# Patient Record
Sex: Female | Born: 1960 | Race: White | Hispanic: No | Marital: Single | Smoking: Current every day smoker
Health system: Southern US, Community
[De-identification: ages and names within clinical notes are randomized; demographics above are authoritative.]

## PROBLEM LIST (undated history)

## (undated) DIAGNOSIS — E119 Type 2 diabetes mellitus without complications: Secondary | ICD-10-CM

## (undated) DIAGNOSIS — J449 Chronic obstructive pulmonary disease, unspecified: Secondary | ICD-10-CM

## (undated) DIAGNOSIS — G709 Myoneural disorder, unspecified: Secondary | ICD-10-CM

## (undated) DIAGNOSIS — F329 Major depressive disorder, single episode, unspecified: Secondary | ICD-10-CM

## (undated) DIAGNOSIS — L0390 Cellulitis, unspecified: Secondary | ICD-10-CM

## (undated) DIAGNOSIS — K219 Gastro-esophageal reflux disease without esophagitis: Secondary | ICD-10-CM

## (undated) DIAGNOSIS — E039 Hypothyroidism, unspecified: Secondary | ICD-10-CM

## (undated) DIAGNOSIS — B958 Unspecified staphylococcus as the cause of diseases classified elsewhere: Secondary | ICD-10-CM

## (undated) DIAGNOSIS — Z22322 Carrier or suspected carrier of Methicillin resistant Staphylococcus aureus: Secondary | ICD-10-CM

## (undated) DIAGNOSIS — E1140 Type 2 diabetes mellitus with diabetic neuropathy, unspecified: Secondary | ICD-10-CM

## (undated) DIAGNOSIS — Z9989 Dependence on other enabling machines and devices: Secondary | ICD-10-CM

## (undated) DIAGNOSIS — I5032 Chronic diastolic (congestive) heart failure: Secondary | ICD-10-CM

## (undated) DIAGNOSIS — I739 Peripheral vascular disease, unspecified: Secondary | ICD-10-CM

## (undated) DIAGNOSIS — I10 Essential (primary) hypertension: Secondary | ICD-10-CM

## (undated) DIAGNOSIS — G459 Transient cerebral ischemic attack, unspecified: Secondary | ICD-10-CM

## (undated) DIAGNOSIS — T4145XA Adverse effect of unspecified anesthetic, initial encounter: Secondary | ICD-10-CM

## (undated) DIAGNOSIS — F32A Depression, unspecified: Secondary | ICD-10-CM

## (undated) DIAGNOSIS — F419 Anxiety disorder, unspecified: Secondary | ICD-10-CM

## (undated) DIAGNOSIS — R001 Bradycardia, unspecified: Secondary | ICD-10-CM

## (undated) DIAGNOSIS — G4733 Obstructive sleep apnea (adult) (pediatric): Secondary | ICD-10-CM

## (undated) DIAGNOSIS — E785 Hyperlipidemia, unspecified: Secondary | ICD-10-CM

## (undated) HISTORY — PX: DILATION AND CURETTAGE OF UTERUS: SHX78

## (undated) HISTORY — PX: BLADDER SURGERY: SHX569

## (undated) HISTORY — PX: WRIST SURGERY: SHX841

## (undated) HISTORY — PX: TOE AMPUTATION: SHX809

## (undated) HISTORY — PX: GANGLION CYST EXCISION: SHX1691

## (undated) HISTORY — DX: Transient cerebral ischemic attack, unspecified: G45.9

## (undated) HISTORY — DX: Myoneural disorder, unspecified: G70.9

## (undated) HISTORY — PX: TUBAL LIGATION: SHX77

---

## 1998-01-05 ENCOUNTER — Other Ambulatory Visit: Admission: RE | Admit: 1998-01-05 | Discharge: 1998-01-05 | Payer: Self-pay | Admitting: Family Medicine

## 1998-05-14 ENCOUNTER — Inpatient Hospital Stay (HOSPITAL_COMMUNITY): Admission: AD | Admit: 1998-05-14 | Discharge: 1998-05-14 | Payer: Self-pay | Admitting: Obstetrics

## 1998-07-05 ENCOUNTER — Ambulatory Visit (HOSPITAL_COMMUNITY): Admission: RE | Admit: 1998-07-05 | Discharge: 1998-07-05 | Payer: Self-pay | Admitting: Obstetrics & Gynecology

## 1998-07-08 ENCOUNTER — Inpatient Hospital Stay (HOSPITAL_COMMUNITY): Admission: AD | Admit: 1998-07-08 | Discharge: 1998-07-08 | Payer: Self-pay | Admitting: Obstetrics & Gynecology

## 1998-07-23 ENCOUNTER — Encounter: Admission: RE | Admit: 1998-07-23 | Discharge: 1998-07-23 | Payer: Self-pay | Admitting: Obstetrics & Gynecology

## 1998-10-11 ENCOUNTER — Encounter: Admission: RE | Admit: 1998-10-11 | Discharge: 1998-10-11 | Payer: Self-pay | Admitting: Internal Medicine

## 1998-10-15 ENCOUNTER — Emergency Department (HOSPITAL_COMMUNITY): Admission: EM | Admit: 1998-10-15 | Discharge: 1998-10-15 | Payer: Self-pay | Admitting: Emergency Medicine

## 1998-10-16 ENCOUNTER — Encounter: Payer: Self-pay | Admitting: Emergency Medicine

## 1998-10-28 ENCOUNTER — Encounter: Payer: Self-pay | Admitting: Internal Medicine

## 1998-10-28 ENCOUNTER — Encounter: Admission: RE | Admit: 1998-10-28 | Discharge: 1998-10-28 | Payer: Self-pay | Admitting: Internal Medicine

## 1998-10-28 ENCOUNTER — Ambulatory Visit (HOSPITAL_COMMUNITY): Admission: RE | Admit: 1998-10-28 | Discharge: 1998-10-28 | Payer: Self-pay | Admitting: Internal Medicine

## 1998-12-21 ENCOUNTER — Encounter: Admission: RE | Admit: 1998-12-21 | Discharge: 1998-12-21 | Payer: Self-pay | Admitting: Internal Medicine

## 1999-05-14 ENCOUNTER — Emergency Department (HOSPITAL_COMMUNITY): Admission: EM | Admit: 1999-05-14 | Discharge: 1999-05-14 | Payer: Self-pay | Admitting: Emergency Medicine

## 1999-05-14 ENCOUNTER — Encounter: Payer: Self-pay | Admitting: Emergency Medicine

## 1999-05-29 ENCOUNTER — Emergency Department (HOSPITAL_COMMUNITY): Admission: EM | Admit: 1999-05-29 | Discharge: 1999-05-29 | Payer: Self-pay | Admitting: Emergency Medicine

## 1999-05-29 ENCOUNTER — Encounter: Payer: Self-pay | Admitting: *Deleted

## 1999-06-07 ENCOUNTER — Encounter: Admission: RE | Admit: 1999-06-07 | Discharge: 1999-06-07 | Payer: Self-pay | Admitting: Internal Medicine

## 1999-07-12 ENCOUNTER — Encounter: Admission: RE | Admit: 1999-07-12 | Discharge: 1999-07-12 | Payer: Self-pay | Admitting: Internal Medicine

## 2000-01-05 ENCOUNTER — Encounter: Admission: RE | Admit: 2000-01-05 | Discharge: 2000-01-05 | Payer: Self-pay | Admitting: Internal Medicine

## 2000-05-10 ENCOUNTER — Encounter: Admission: RE | Admit: 2000-05-10 | Discharge: 2000-05-10 | Payer: Self-pay | Admitting: Internal Medicine

## 2000-07-18 ENCOUNTER — Emergency Department (HOSPITAL_COMMUNITY): Admission: EM | Admit: 2000-07-18 | Discharge: 2000-07-18 | Payer: Self-pay | Admitting: Emergency Medicine

## 2000-07-21 ENCOUNTER — Emergency Department (HOSPITAL_COMMUNITY): Admission: EM | Admit: 2000-07-21 | Discharge: 2000-07-21 | Payer: Self-pay | Admitting: Emergency Medicine

## 2000-09-14 ENCOUNTER — Encounter: Payer: Self-pay | Admitting: Emergency Medicine

## 2000-09-14 ENCOUNTER — Emergency Department (HOSPITAL_COMMUNITY): Admission: EM | Admit: 2000-09-14 | Discharge: 2000-09-14 | Payer: Self-pay | Admitting: Emergency Medicine

## 2001-01-01 ENCOUNTER — Emergency Department (HOSPITAL_COMMUNITY): Admission: EM | Admit: 2001-01-01 | Discharge: 2001-01-01 | Payer: Self-pay | Admitting: Emergency Medicine

## 2001-01-02 ENCOUNTER — Encounter: Admission: RE | Admit: 2001-01-02 | Discharge: 2001-01-02 | Payer: Self-pay | Admitting: Internal Medicine

## 2001-02-05 ENCOUNTER — Encounter: Admission: RE | Admit: 2001-02-05 | Discharge: 2001-02-05 | Payer: Self-pay | Admitting: Internal Medicine

## 2001-03-14 ENCOUNTER — Encounter: Admission: RE | Admit: 2001-03-14 | Discharge: 2001-03-14 | Payer: Self-pay | Admitting: Internal Medicine

## 2001-03-17 ENCOUNTER — Emergency Department (HOSPITAL_COMMUNITY): Admission: EM | Admit: 2001-03-17 | Discharge: 2001-03-17 | Payer: Self-pay | Admitting: Emergency Medicine

## 2001-03-21 ENCOUNTER — Emergency Department (HOSPITAL_COMMUNITY): Admission: EM | Admit: 2001-03-21 | Discharge: 2001-03-21 | Payer: Self-pay | Admitting: Emergency Medicine

## 2001-04-03 ENCOUNTER — Encounter: Admission: RE | Admit: 2001-04-03 | Discharge: 2001-04-03 | Payer: Self-pay | Admitting: Internal Medicine

## 2001-09-07 ENCOUNTER — Encounter: Payer: Self-pay | Admitting: Emergency Medicine

## 2001-09-07 ENCOUNTER — Emergency Department (HOSPITAL_COMMUNITY): Admission: EM | Admit: 2001-09-07 | Discharge: 2001-09-07 | Payer: Self-pay | Admitting: Emergency Medicine

## 2001-09-11 ENCOUNTER — Encounter: Admission: RE | Admit: 2001-09-11 | Discharge: 2001-09-11 | Payer: Self-pay

## 2001-10-14 ENCOUNTER — Ambulatory Visit (HOSPITAL_COMMUNITY): Admission: RE | Admit: 2001-10-14 | Discharge: 2001-10-14 | Payer: Self-pay | Admitting: Internal Medicine

## 2001-10-14 ENCOUNTER — Encounter: Payer: Self-pay | Admitting: Internal Medicine

## 2001-10-14 ENCOUNTER — Encounter: Admission: RE | Admit: 2001-10-14 | Discharge: 2001-10-14 | Payer: Self-pay | Admitting: Internal Medicine

## 2001-11-27 ENCOUNTER — Encounter: Admission: RE | Admit: 2001-11-27 | Discharge: 2001-11-27 | Payer: Self-pay | Admitting: Internal Medicine

## 2002-10-08 ENCOUNTER — Encounter: Admission: RE | Admit: 2002-10-08 | Discharge: 2002-10-08 | Payer: Self-pay | Admitting: Internal Medicine

## 2002-11-26 ENCOUNTER — Encounter: Admission: RE | Admit: 2002-11-26 | Discharge: 2002-11-26 | Payer: Self-pay | Admitting: Internal Medicine

## 2003-07-07 ENCOUNTER — Emergency Department (HOSPITAL_COMMUNITY): Admission: AD | Admit: 2003-07-07 | Discharge: 2003-07-07 | Payer: Self-pay | Admitting: Family Medicine

## 2003-07-09 ENCOUNTER — Encounter: Admission: RE | Admit: 2003-07-09 | Discharge: 2003-07-09 | Payer: Self-pay | Admitting: Internal Medicine

## 2003-08-02 ENCOUNTER — Emergency Department (HOSPITAL_COMMUNITY): Admission: EM | Admit: 2003-08-02 | Discharge: 2003-08-02 | Payer: Self-pay | Admitting: *Deleted

## 2003-08-06 ENCOUNTER — Encounter: Admission: RE | Admit: 2003-08-06 | Discharge: 2003-08-06 | Payer: Self-pay | Admitting: Internal Medicine

## 2003-08-12 ENCOUNTER — Emergency Department (HOSPITAL_COMMUNITY): Admission: AD | Admit: 2003-08-12 | Discharge: 2003-08-12 | Payer: Self-pay | Admitting: Family Medicine

## 2003-08-13 ENCOUNTER — Encounter: Admission: RE | Admit: 2003-08-13 | Discharge: 2003-11-11 | Payer: Self-pay | Admitting: Specialist

## 2003-09-24 ENCOUNTER — Encounter: Admission: RE | Admit: 2003-09-24 | Discharge: 2003-09-24 | Payer: Self-pay | Admitting: Internal Medicine

## 2003-10-06 ENCOUNTER — Encounter: Admission: RE | Admit: 2003-10-06 | Discharge: 2004-01-04 | Payer: Self-pay | Admitting: Internal Medicine

## 2003-11-01 ENCOUNTER — Encounter: Admission: RE | Admit: 2003-11-01 | Discharge: 2003-11-01 | Payer: Self-pay | Admitting: Specialist

## 2003-12-02 ENCOUNTER — Emergency Department (HOSPITAL_COMMUNITY): Admission: EM | Admit: 2003-12-02 | Discharge: 2003-12-02 | Payer: Self-pay | Admitting: Family Medicine

## 2003-12-04 ENCOUNTER — Emergency Department (HOSPITAL_COMMUNITY): Admission: EM | Admit: 2003-12-04 | Discharge: 2003-12-04 | Payer: Self-pay | Admitting: Family Medicine

## 2004-05-10 ENCOUNTER — Emergency Department (HOSPITAL_COMMUNITY): Admission: EM | Admit: 2004-05-10 | Discharge: 2004-05-10 | Payer: Self-pay | Admitting: Family Medicine

## 2004-05-12 ENCOUNTER — Emergency Department (HOSPITAL_COMMUNITY): Admission: EM | Admit: 2004-05-12 | Discharge: 2004-05-12 | Payer: Self-pay | Admitting: Family Medicine

## 2004-08-28 ENCOUNTER — Emergency Department (HOSPITAL_COMMUNITY): Admission: EM | Admit: 2004-08-28 | Discharge: 2004-08-28 | Payer: Self-pay | Admitting: Family Medicine

## 2004-08-29 ENCOUNTER — Emergency Department (HOSPITAL_COMMUNITY): Admission: EM | Admit: 2004-08-29 | Discharge: 2004-08-29 | Payer: Self-pay | Admitting: Family Medicine

## 2004-09-29 ENCOUNTER — Emergency Department (HOSPITAL_COMMUNITY): Admission: EM | Admit: 2004-09-29 | Discharge: 2004-09-29 | Payer: Self-pay | Admitting: Emergency Medicine

## 2005-01-29 ENCOUNTER — Emergency Department (HOSPITAL_COMMUNITY): Admission: EM | Admit: 2005-01-29 | Discharge: 2005-01-29 | Payer: Self-pay | Admitting: Emergency Medicine

## 2005-05-16 ENCOUNTER — Emergency Department (HOSPITAL_COMMUNITY): Admission: EM | Admit: 2005-05-16 | Discharge: 2005-05-16 | Payer: Self-pay | Admitting: Family Medicine

## 2005-08-21 DIAGNOSIS — B958 Unspecified staphylococcus as the cause of diseases classified elsewhere: Secondary | ICD-10-CM

## 2005-08-21 HISTORY — DX: Unspecified staphylococcus as the cause of diseases classified elsewhere: B95.8

## 2005-11-10 ENCOUNTER — Emergency Department (HOSPITAL_COMMUNITY): Admission: EM | Admit: 2005-11-10 | Discharge: 2005-11-10 | Payer: Self-pay | Admitting: Family Medicine

## 2006-01-16 ENCOUNTER — Emergency Department (HOSPITAL_COMMUNITY): Admission: EM | Admit: 2006-01-16 | Discharge: 2006-01-16 | Payer: Self-pay | Admitting: Family Medicine

## 2006-03-26 ENCOUNTER — Emergency Department (HOSPITAL_COMMUNITY): Admission: EM | Admit: 2006-03-26 | Discharge: 2006-03-26 | Payer: Self-pay | Admitting: Emergency Medicine

## 2006-06-24 ENCOUNTER — Emergency Department (HOSPITAL_COMMUNITY): Admission: EM | Admit: 2006-06-24 | Discharge: 2006-06-24 | Payer: Self-pay | Admitting: Emergency Medicine

## 2006-06-27 ENCOUNTER — Emergency Department (HOSPITAL_COMMUNITY): Admission: EM | Admit: 2006-06-27 | Discharge: 2006-06-27 | Payer: Self-pay | Admitting: Emergency Medicine

## 2007-01-31 ENCOUNTER — Emergency Department (HOSPITAL_COMMUNITY): Admission: EM | Admit: 2007-01-31 | Discharge: 2007-01-31 | Payer: Self-pay | Admitting: Emergency Medicine

## 2007-02-03 ENCOUNTER — Emergency Department (HOSPITAL_COMMUNITY): Admission: EM | Admit: 2007-02-03 | Discharge: 2007-02-03 | Payer: Self-pay | Admitting: Emergency Medicine

## 2007-07-25 ENCOUNTER — Telehealth (INDEPENDENT_AMBULATORY_CARE_PROVIDER_SITE_OTHER): Payer: Self-pay | Admitting: *Deleted

## 2007-07-25 ENCOUNTER — Emergency Department (HOSPITAL_COMMUNITY): Admission: EM | Admit: 2007-07-25 | Discharge: 2007-07-25 | Payer: Self-pay | Admitting: Emergency Medicine

## 2007-08-05 ENCOUNTER — Encounter (INDEPENDENT_AMBULATORY_CARE_PROVIDER_SITE_OTHER): Payer: Self-pay | Admitting: Nurse Practitioner

## 2007-08-06 ENCOUNTER — Telehealth (INDEPENDENT_AMBULATORY_CARE_PROVIDER_SITE_OTHER): Payer: Self-pay | Admitting: Nurse Practitioner

## 2007-08-06 ENCOUNTER — Ambulatory Visit: Payer: Self-pay | Admitting: Nurse Practitioner

## 2007-08-06 DIAGNOSIS — E1165 Type 2 diabetes mellitus with hyperglycemia: Secondary | ICD-10-CM | POA: Insufficient documentation

## 2007-08-06 DIAGNOSIS — E118 Type 2 diabetes mellitus with unspecified complications: Secondary | ICD-10-CM

## 2007-08-06 LAB — CONVERTED CEMR LAB
Blood Glucose, Fingerstick: 295
Hgb A1c MFr Bld: 12 %

## 2007-08-07 LAB — CONVERTED CEMR LAB
ALT: 23 units/L (ref 0–35)
AST: 18 units/L (ref 0–37)
Albumin: 4 g/dL (ref 3.5–5.2)
Alkaline Phosphatase: 92 units/L (ref 39–117)
BUN: 10 mg/dL (ref 6–23)
Basophils Absolute: 0 10*3/uL (ref 0.0–0.1)
Basophils Relative: 0 % (ref 0–1)
CO2: 25 meq/L (ref 19–32)
Calcium: 9.1 mg/dL (ref 8.4–10.5)
Chloride: 102 meq/L (ref 96–112)
Cholesterol: 221 mg/dL — ABNORMAL HIGH (ref 0–200)
Creatinine, Ser: 0.62 mg/dL (ref 0.40–1.20)
Eosinophils Absolute: 0 10*3/uL — ABNORMAL LOW (ref 0.2–0.7)
Eosinophils Relative: 0 % (ref 0–5)
Glucose, Bld: 221 mg/dL — ABNORMAL HIGH (ref 70–99)
HCT: 44.5 % (ref 36.0–46.0)
HDL: 34 mg/dL — ABNORMAL LOW (ref >39)
Hemoglobin: 14.9 g/dL (ref 12.0–15.0)
Lymphocytes Relative: 45 % (ref 12–46)
Lymphs Abs: 2.4 10*3/uL (ref 0.7–4.0)
MCHC: 33.5 g/dL (ref 30.0–36.0)
MCV: 84.6 fL (ref 78.0–100.0)
Monocytes Absolute: 0.6 10*3/uL (ref 0.1–1.0)
Monocytes Relative: 12 % (ref 3–12)
Neutro Abs: 2.3 10*3/uL (ref 1.7–7.7)
Neutrophils Relative %: 43 % (ref 43–77)
Platelets: 205 10*3/uL (ref 150–400)
Potassium: 4.6 meq/L (ref 3.5–5.3)
RBC: 5.26 M/uL — ABNORMAL HIGH (ref 3.87–5.11)
RDW: 13.3 % (ref 11.5–15.5)
Sodium: 139 meq/L (ref 135–145)
TSH: 4.586 microintl units/mL (ref 0.350–5.50)
Total Bilirubin: 0.5 mg/dL (ref 0.3–1.2)
Total CHOL/HDL Ratio: 6.5
Total Protein: 6.8 g/dL (ref 6.0–8.3)
Triglycerides: 488 mg/dL — ABNORMAL HIGH (ref <150)
WBC: 5.4 10*3/uL (ref 4.0–10.5)

## 2007-09-07 ENCOUNTER — Emergency Department (HOSPITAL_COMMUNITY): Admission: EM | Admit: 2007-09-07 | Discharge: 2007-09-08 | Payer: Self-pay | Admitting: *Deleted

## 2007-10-27 ENCOUNTER — Emergency Department (HOSPITAL_COMMUNITY): Admission: EM | Admit: 2007-10-27 | Discharge: 2007-10-27 | Payer: Self-pay | Admitting: Family Medicine

## 2008-01-27 ENCOUNTER — Telehealth (INDEPENDENT_AMBULATORY_CARE_PROVIDER_SITE_OTHER): Payer: Self-pay | Admitting: Nurse Practitioner

## 2008-01-27 ENCOUNTER — Emergency Department (HOSPITAL_COMMUNITY): Admission: EM | Admit: 2008-01-27 | Discharge: 2008-01-27 | Payer: Self-pay | Admitting: Emergency Medicine

## 2008-01-27 LAB — CONVERTED CEMR LAB
BUN: 11 mg/dL
Chloride: 100 meq/L
Creatinine, Ser: 0.7 mg/dL
Glucose, Bld: 358 mg/dL
HCT: 44 %
Hemoglobin: 15 g/dL
Potassium: 4.7 meq/L
Sodium: 134 meq/L

## 2008-01-30 ENCOUNTER — Ambulatory Visit: Payer: Self-pay | Admitting: Nurse Practitioner

## 2008-01-30 LAB — CONVERTED CEMR LAB
Bilirubin Urine: NEGATIVE
Blood Glucose, Fingerstick: 332
Blood in Urine, dipstick: NEGATIVE
Glucose, Urine, Semiquant: >=1000
Hgb A1c MFr Bld: 13 %
Nitrite: NEGATIVE
Protein, U semiquant: NEGATIVE
Specific Gravity, Urine: >=1.03
Urobilinogen, UA: 0.2
WBC Urine, dipstick: NEGATIVE
pH: 5

## 2008-02-24 ENCOUNTER — Ambulatory Visit: Payer: Self-pay | Admitting: Nurse Practitioner

## 2008-02-25 ENCOUNTER — Encounter (INDEPENDENT_AMBULATORY_CARE_PROVIDER_SITE_OTHER): Payer: Self-pay | Admitting: Nurse Practitioner

## 2008-02-25 LAB — CONVERTED CEMR LAB
Cholesterol: 254 mg/dL — ABNORMAL HIGH (ref 0–200)
HDL: 35 mg/dL — ABNORMAL LOW (ref >39)
Total CHOL/HDL Ratio: 7.3
Triglycerides: 654 mg/dL — ABNORMAL HIGH (ref <150)

## 2008-06-05 ENCOUNTER — Emergency Department (HOSPITAL_COMMUNITY): Admission: EM | Admit: 2008-06-05 | Discharge: 2008-06-05 | Payer: Self-pay | Admitting: Emergency Medicine

## 2008-06-25 ENCOUNTER — Telehealth (INDEPENDENT_AMBULATORY_CARE_PROVIDER_SITE_OTHER): Payer: Self-pay | Admitting: Nurse Practitioner

## 2008-07-10 ENCOUNTER — Encounter (INDEPENDENT_AMBULATORY_CARE_PROVIDER_SITE_OTHER): Payer: Self-pay | Admitting: *Deleted

## 2008-09-18 ENCOUNTER — Emergency Department (HOSPITAL_COMMUNITY): Admission: EM | Admit: 2008-09-18 | Discharge: 2008-09-18 | Payer: Self-pay | Admitting: Emergency Medicine

## 2009-03-14 ENCOUNTER — Emergency Department (HOSPITAL_COMMUNITY): Admission: EM | Admit: 2009-03-14 | Discharge: 2009-03-14 | Payer: Self-pay | Admitting: Emergency Medicine

## 2009-06-04 ENCOUNTER — Ambulatory Visit: Payer: Self-pay | Admitting: Nurse Practitioner

## 2009-06-04 DIAGNOSIS — F17200 Nicotine dependence, unspecified, uncomplicated: Secondary | ICD-10-CM

## 2009-06-04 DIAGNOSIS — L84 Corns and callosities: Secondary | ICD-10-CM | POA: Insufficient documentation

## 2009-06-04 DIAGNOSIS — B373 Candidiasis of vulva and vagina: Secondary | ICD-10-CM

## 2009-06-04 LAB — CONVERTED CEMR LAB
Blood Glucose, Fingerstick: 333
Cholesterol, target level: 200 mg/dL
HDL goal, serum: 40 mg/dL
Hgb A1c MFr Bld: 12.1 %
LDL Goal: 100 mg/dL

## 2009-06-11 ENCOUNTER — Telehealth (INDEPENDENT_AMBULATORY_CARE_PROVIDER_SITE_OTHER): Payer: Self-pay | Admitting: Nurse Practitioner

## 2009-06-22 ENCOUNTER — Encounter (INDEPENDENT_AMBULATORY_CARE_PROVIDER_SITE_OTHER): Payer: Self-pay | Admitting: Nurse Practitioner

## 2009-06-22 ENCOUNTER — Ambulatory Visit: Payer: Self-pay | Admitting: Nurse Practitioner

## 2009-06-22 ENCOUNTER — Telehealth (INDEPENDENT_AMBULATORY_CARE_PROVIDER_SITE_OTHER): Payer: Self-pay | Admitting: Nurse Practitioner

## 2009-06-22 DIAGNOSIS — R3915 Urgency of urination: Secondary | ICD-10-CM

## 2009-06-22 LAB — CONVERTED CEMR LAB
Bilirubin Urine: NEGATIVE
Blood Glucose, Fingerstick: 277
Blood in Urine, dipstick: NEGATIVE
Glucose, Urine, Semiquant: 500
Ketones, urine, test strip: NEGATIVE
Nitrite: NEGATIVE
Protein, U semiquant: NEGATIVE
Specific Gravity, Urine: >=1.03
Urobilinogen, UA: 0.2
WBC Urine, dipstick: NEGATIVE
pH: 5

## 2009-06-24 ENCOUNTER — Encounter (INDEPENDENT_AMBULATORY_CARE_PROVIDER_SITE_OTHER): Payer: Self-pay | Admitting: Nurse Practitioner

## 2009-07-01 ENCOUNTER — Ambulatory Visit: Payer: Self-pay | Admitting: Nurse Practitioner

## 2009-07-01 ENCOUNTER — Encounter (INDEPENDENT_AMBULATORY_CARE_PROVIDER_SITE_OTHER): Payer: Self-pay | Admitting: Nurse Practitioner

## 2009-07-01 ENCOUNTER — Other Ambulatory Visit: Admission: RE | Admit: 2009-07-01 | Discharge: 2009-07-01 | Payer: Self-pay | Admitting: Internal Medicine

## 2009-07-01 DIAGNOSIS — R799 Abnormal finding of blood chemistry, unspecified: Secondary | ICD-10-CM | POA: Insufficient documentation

## 2009-07-01 LAB — CONVERTED CEMR LAB

## 2009-07-06 ENCOUNTER — Encounter (INDEPENDENT_AMBULATORY_CARE_PROVIDER_SITE_OTHER): Payer: Self-pay | Admitting: *Deleted

## 2009-07-06 ENCOUNTER — Encounter (INDEPENDENT_AMBULATORY_CARE_PROVIDER_SITE_OTHER): Payer: Self-pay | Admitting: Nurse Practitioner

## 2009-07-07 ENCOUNTER — Encounter (INDEPENDENT_AMBULATORY_CARE_PROVIDER_SITE_OTHER): Payer: Self-pay | Admitting: Nurse Practitioner

## 2009-07-09 ENCOUNTER — Ambulatory Visit (HOSPITAL_COMMUNITY): Admission: RE | Admit: 2009-07-09 | Discharge: 2009-07-09 | Payer: Self-pay | Admitting: Internal Medicine

## 2009-07-13 ENCOUNTER — Ambulatory Visit: Payer: Self-pay | Admitting: Nurse Practitioner

## 2009-08-04 ENCOUNTER — Encounter (INDEPENDENT_AMBULATORY_CARE_PROVIDER_SITE_OTHER): Payer: Self-pay | Admitting: Nurse Practitioner

## 2009-10-01 ENCOUNTER — Emergency Department (HOSPITAL_COMMUNITY): Admission: EM | Admit: 2009-10-01 | Discharge: 2009-10-01 | Payer: Self-pay | Admitting: Emergency Medicine

## 2009-10-05 ENCOUNTER — Encounter (INDEPENDENT_AMBULATORY_CARE_PROVIDER_SITE_OTHER): Payer: Self-pay | Admitting: Nurse Practitioner

## 2009-11-10 ENCOUNTER — Encounter (INDEPENDENT_AMBULATORY_CARE_PROVIDER_SITE_OTHER): Payer: Self-pay | Admitting: Nurse Practitioner

## 2009-12-10 ENCOUNTER — Emergency Department (HOSPITAL_COMMUNITY): Admission: EM | Admit: 2009-12-10 | Discharge: 2009-12-10 | Payer: Self-pay | Admitting: Emergency Medicine

## 2010-02-01 ENCOUNTER — Ambulatory Visit: Payer: Self-pay | Admitting: Nurse Practitioner

## 2010-02-01 DIAGNOSIS — F329 Major depressive disorder, single episode, unspecified: Secondary | ICD-10-CM

## 2010-02-01 DIAGNOSIS — L989 Disorder of the skin and subcutaneous tissue, unspecified: Secondary | ICD-10-CM | POA: Insufficient documentation

## 2010-02-01 LAB — CONVERTED CEMR LAB
Bilirubin Urine: NEGATIVE
Blood Glucose, Fingerstick: 274
Glucose, Urine, Semiquant: >=1000
KOH Prep: NEGATIVE
Ketones, urine, test strip: NEGATIVE
Nitrite: POSITIVE
Protein, U semiquant: NEGATIVE
Specific Gravity, Urine: >=1.03
Urobilinogen, UA: 0.2
WBC Urine, dipstick: NEGATIVE
pH: 5

## 2010-02-02 ENCOUNTER — Encounter (INDEPENDENT_AMBULATORY_CARE_PROVIDER_SITE_OTHER): Payer: Self-pay | Admitting: *Deleted

## 2010-02-02 LAB — CONVERTED CEMR LAB: Hgb A1c MFr Bld: 13.5 % — ABNORMAL HIGH (ref <5.7)

## 2010-02-18 DIAGNOSIS — L97509 Non-pressure chronic ulcer of other part of unspecified foot with unspecified severity: Secondary | ICD-10-CM

## 2010-03-01 ENCOUNTER — Telehealth (INDEPENDENT_AMBULATORY_CARE_PROVIDER_SITE_OTHER): Payer: Self-pay | Admitting: Nurse Practitioner

## 2010-03-17 ENCOUNTER — Emergency Department (HOSPITAL_COMMUNITY): Admission: EM | Admit: 2010-03-17 | Discharge: 2010-03-17 | Payer: Self-pay | Admitting: Family Medicine

## 2010-03-17 ENCOUNTER — Inpatient Hospital Stay (HOSPITAL_COMMUNITY): Admission: EM | Admit: 2010-03-17 | Discharge: 2010-03-22 | Payer: Self-pay | Admitting: Emergency Medicine

## 2010-03-17 LAB — CONVERTED CEMR LAB
HCT: 36 %
Hemoglobin: 12.6 g/dL
MCHC: 34.9 g/dL
MCV: 88.4 fL
Platelets: 202 10*3/uL
RBC: 4.07 M/uL
RDW: 13.3 %
WBC: 8.6 10*3/uL

## 2010-03-19 LAB — CONVERTED CEMR LAB: Hgb A1c MFr Bld: 11.8 %

## 2010-03-20 LAB — CONVERTED CEMR LAB
BUN: 8 mg/dL
CO2: 30 meq/L
Calcium: 8.8 mg/dL
Chloride: 102 meq/L
Creatinine, Ser: 0.51 mg/dL
Glucose, Bld: 235 mg/dL
Potassium: 4.8 meq/L
Sodium: 138 meq/L

## 2010-03-22 ENCOUNTER — Encounter (INDEPENDENT_AMBULATORY_CARE_PROVIDER_SITE_OTHER): Payer: Self-pay | Admitting: Internal Medicine

## 2010-03-23 DIAGNOSIS — M7730 Calcaneal spur, unspecified foot: Secondary | ICD-10-CM | POA: Insufficient documentation

## 2010-03-24 ENCOUNTER — Encounter (HOSPITAL_BASED_OUTPATIENT_CLINIC_OR_DEPARTMENT_OTHER): Admission: RE | Admit: 2010-03-24 | Discharge: 2010-06-22 | Payer: Self-pay | Admitting: Internal Medicine

## 2010-03-25 ENCOUNTER — Ambulatory Visit: Payer: Self-pay | Admitting: Nurse Practitioner

## 2010-03-25 LAB — CONVERTED CEMR LAB
Bilirubin Urine: NEGATIVE
Blood Glucose, Fingerstick: 136
Blood in Urine, dipstick: NEGATIVE
Glucose, Urine, Semiquant: NEGATIVE
Ketones, urine, test strip: NEGATIVE
Nitrite: NEGATIVE
Protein, U semiquant: NEGATIVE
Specific Gravity, Urine: 1.015
Urobilinogen, UA: 0.2
WBC Urine, dipstick: NEGATIVE
pH: 7.5

## 2010-03-28 ENCOUNTER — Ambulatory Visit (HOSPITAL_COMMUNITY): Admission: RE | Admit: 2010-03-28 | Discharge: 2010-03-28 | Payer: Self-pay | Admitting: General Surgery

## 2010-04-06 ENCOUNTER — Telehealth (INDEPENDENT_AMBULATORY_CARE_PROVIDER_SITE_OTHER): Payer: Self-pay | Admitting: Nurse Practitioner

## 2010-04-12 ENCOUNTER — Encounter (INDEPENDENT_AMBULATORY_CARE_PROVIDER_SITE_OTHER): Payer: Self-pay | Admitting: Nurse Practitioner

## 2010-04-12 DIAGNOSIS — S98119A Complete traumatic amputation of unspecified great toe, initial encounter: Secondary | ICD-10-CM

## 2010-04-12 DIAGNOSIS — M869 Osteomyelitis, unspecified: Secondary | ICD-10-CM | POA: Insufficient documentation

## 2010-04-13 ENCOUNTER — Ambulatory Visit: Payer: Self-pay | Admitting: Internal Medicine

## 2010-04-13 ENCOUNTER — Inpatient Hospital Stay (HOSPITAL_COMMUNITY): Admission: EM | Admit: 2010-04-13 | Discharge: 2010-04-17 | Payer: Self-pay | Admitting: *Deleted

## 2010-04-14 ENCOUNTER — Ambulatory Visit: Payer: Self-pay | Admitting: Vascular Surgery

## 2010-04-14 ENCOUNTER — Encounter (INDEPENDENT_AMBULATORY_CARE_PROVIDER_SITE_OTHER): Payer: Self-pay | Admitting: Internal Medicine

## 2010-04-14 ENCOUNTER — Encounter (INDEPENDENT_AMBULATORY_CARE_PROVIDER_SITE_OTHER): Payer: Self-pay | Admitting: Orthopedic Surgery

## 2010-04-21 ENCOUNTER — Telehealth (INDEPENDENT_AMBULATORY_CARE_PROVIDER_SITE_OTHER): Payer: Self-pay | Admitting: Nurse Practitioner

## 2010-04-22 ENCOUNTER — Telehealth (INDEPENDENT_AMBULATORY_CARE_PROVIDER_SITE_OTHER): Payer: Self-pay | Admitting: Nurse Practitioner

## 2010-04-22 ENCOUNTER — Ambulatory Visit: Payer: Self-pay | Admitting: Vascular Surgery

## 2010-05-02 ENCOUNTER — Ambulatory Visit: Payer: Self-pay | Admitting: Nurse Practitioner

## 2010-05-02 DIAGNOSIS — S4980XA Other specified injuries of shoulder and upper arm, unspecified arm, initial encounter: Secondary | ICD-10-CM

## 2010-05-19 ENCOUNTER — Emergency Department (HOSPITAL_COMMUNITY): Admission: EM | Admit: 2010-05-19 | Discharge: 2010-05-19 | Payer: Self-pay | Admitting: Emergency Medicine

## 2010-05-24 ENCOUNTER — Telehealth (INDEPENDENT_AMBULATORY_CARE_PROVIDER_SITE_OTHER): Payer: Self-pay | Admitting: *Deleted

## 2010-05-25 ENCOUNTER — Encounter (INDEPENDENT_AMBULATORY_CARE_PROVIDER_SITE_OTHER): Payer: Self-pay | Admitting: Nurse Practitioner

## 2010-06-03 ENCOUNTER — Encounter (INDEPENDENT_AMBULATORY_CARE_PROVIDER_SITE_OTHER): Payer: Self-pay | Admitting: Orthopedic Surgery

## 2010-06-03 ENCOUNTER — Inpatient Hospital Stay (HOSPITAL_COMMUNITY): Admission: RE | Admit: 2010-06-03 | Discharge: 2010-06-08 | Payer: Self-pay | Admitting: Orthopedic Surgery

## 2010-06-10 ENCOUNTER — Ambulatory Visit (HOSPITAL_COMMUNITY): Admission: RE | Admit: 2010-06-10 | Discharge: 2010-06-10 | Payer: Self-pay | Admitting: Internal Medicine

## 2010-07-06 ENCOUNTER — Telehealth (INDEPENDENT_AMBULATORY_CARE_PROVIDER_SITE_OTHER): Payer: Self-pay | Admitting: Nurse Practitioner

## 2010-07-07 ENCOUNTER — Ambulatory Visit: Payer: Self-pay | Admitting: Nurse Practitioner

## 2010-07-07 DIAGNOSIS — R609 Edema, unspecified: Secondary | ICD-10-CM | POA: Insufficient documentation

## 2010-07-07 LAB — CONVERTED CEMR LAB: Blood Glucose, Fingerstick: 245

## 2010-07-18 ENCOUNTER — Telehealth (INDEPENDENT_AMBULATORY_CARE_PROVIDER_SITE_OTHER): Payer: Self-pay | Admitting: Nurse Practitioner

## 2010-07-29 ENCOUNTER — Observation Stay (HOSPITAL_COMMUNITY)
Admission: RE | Admit: 2010-07-29 | Discharge: 2010-07-30 | Payer: Self-pay | Source: Home / Self Care | Attending: Orthopedic Surgery | Admitting: Orthopedic Surgery

## 2010-07-29 ENCOUNTER — Encounter (INDEPENDENT_AMBULATORY_CARE_PROVIDER_SITE_OTHER): Payer: Self-pay | Admitting: Orthopedic Surgery

## 2010-08-17 ENCOUNTER — Telehealth (INDEPENDENT_AMBULATORY_CARE_PROVIDER_SITE_OTHER): Payer: Self-pay | Admitting: Nurse Practitioner

## 2010-08-17 ENCOUNTER — Ambulatory Visit
Admission: RE | Admit: 2010-08-17 | Discharge: 2010-08-17 | Payer: Self-pay | Source: Home / Self Care | Attending: Nurse Practitioner | Admitting: Nurse Practitioner

## 2010-08-17 DIAGNOSIS — E7800 Pure hypercholesterolemia, unspecified: Secondary | ICD-10-CM

## 2010-08-17 LAB — CONVERTED CEMR LAB: Blood Glucose, Fingerstick: 132

## 2010-08-21 HISTORY — PX: LEG AMPUTATION BELOW KNEE: SHX694

## 2010-08-21 HISTORY — PX: FOOT SURGERY: SHX648

## 2010-08-24 ENCOUNTER — Telehealth (INDEPENDENT_AMBULATORY_CARE_PROVIDER_SITE_OTHER): Payer: Self-pay | Admitting: Nurse Practitioner

## 2010-08-31 ENCOUNTER — Encounter (INDEPENDENT_AMBULATORY_CARE_PROVIDER_SITE_OTHER): Payer: Self-pay | Admitting: Nurse Practitioner

## 2010-09-05 ENCOUNTER — Encounter (INDEPENDENT_AMBULATORY_CARE_PROVIDER_SITE_OTHER): Payer: Self-pay | Admitting: Nurse Practitioner

## 2010-09-07 ENCOUNTER — Ambulatory Visit: Payer: Self-pay | Admitting: Vascular Surgery

## 2010-09-18 LAB — CONVERTED CEMR LAB
ALT: 18 units/L (ref 0–35)
ANA Titer 1: 1:40 {titer} — ABNORMAL HIGH
AST: 15 units/L (ref 0–37)
Albumin: 4.1 g/dL (ref 3.5–5.2)
Alkaline Phosphatase: 93 units/L (ref 39–117)
Anti Nuclear Antibody(ANA): POSITIVE — AB
BUN: 12 mg/dL (ref 6–23)
Basophils Absolute: 0 10*3/uL (ref 0.0–0.1)
Basophils Relative: 0 % (ref 0–1)
Bilirubin Urine: NEGATIVE
Blood Glucose, AC Bkfst: 239 mg/dL
Blood in Urine, dipstick: NEGATIVE
CO2: 27 meq/L (ref 19–32)
CRP: 1.7 mg/dL — ABNORMAL HIGH (ref <0.6)
Calcium: 9.6 mg/dL (ref 8.4–10.5)
Chlamydia, Swab/Urine, PCR: NEGATIVE
Chloride: 102 meq/L (ref 96–112)
Cholesterol: 254 mg/dL — ABNORMAL HIGH (ref 0–200)
Creatinine, Ser: 0.63 mg/dL (ref 0.40–1.20)
Eosinophils Absolute: 0.1 10*3/uL (ref 0.0–0.7)
Eosinophils Relative: 1 % (ref 0–5)
GC Probe Amp, Urine: NEGATIVE
Glucose, Bld: 201 mg/dL — ABNORMAL HIGH (ref 70–99)
Glucose, Urine, Semiquant: 250
HCT: 44.1 % (ref 36.0–46.0)
HDL: 33 mg/dL — ABNORMAL LOW (ref >39)
Hemoglobin: 14.5 g/dL (ref 12.0–15.0)
Hgb A1c MFr Bld: 11.5 %
KOH Prep: NEGATIVE
Ketones, urine, test strip: NEGATIVE
Lymphocytes Relative: 40 % (ref 12–46)
Lymphs Abs: 3.6 10*3/uL (ref 0.7–4.0)
MCHC: 32.9 g/dL (ref 30.0–36.0)
MCV: 89.1 fL (ref 78.0–100.0)
Microalb, Ur: 1.59 mg/dL (ref 0.00–1.89)
Monocytes Absolute: 0.5 10*3/uL (ref 0.1–1.0)
Monocytes Relative: 6 % (ref 3–12)
Neutro Abs: 4.8 10*3/uL (ref 1.7–7.7)
Neutrophils Relative %: 53 % (ref 43–77)
Nitrite: NEGATIVE
OCCULT 1: NEGATIVE
Platelets: 215 10*3/uL (ref 150–400)
Potassium: 5.2 meq/L (ref 3.5–5.3)
Protein, U semiquant: NEGATIVE
RBC: 4.95 M/uL (ref 3.87–5.11)
RDW: 13.1 % (ref 11.5–15.5)
Rapid HIV Screen: NEGATIVE
Rheumatoid fact SerPl-aCnc: <20 intl units/mL (ref 0–20)
Sed Rate: 16 mm/hr (ref 0–22)
Sodium: 143 meq/L (ref 135–145)
Specific Gravity, Urine: >=1.03
TSH: 4.223 microintl units/mL (ref 0.350–4.500)
Total Bilirubin: 0.3 mg/dL (ref 0.3–1.2)
Total CHOL/HDL Ratio: 7.7
Total Protein: 6.7 g/dL (ref 6.0–8.3)
Triglycerides: 478 mg/dL — ABNORMAL HIGH (ref <150)
Urobilinogen, UA: 0.2
WBC Urine, dipstick: NEGATIVE
WBC: 9.1 10*3/uL (ref 4.0–10.5)
pH: 5

## 2010-09-22 NOTE — Miscellaneous (Signed)
Summary: Eye Exam results   Diabetes Management History:      The patient is a 50 years old female who comes in for evaluation of Type 2 Diabetes Mellitus.  She has not been enrolled in the "Diabetic Education Program".  She is not checking home blood sugars.  She says that she is not exercising regularly.    Diabetes Management Exam:    Eye Exam:       Eye Exam done elsewhere          Date: 10/05/2009          Results: diabetic retinopathy- will be reffed to [facility] Li for possible treatment          Done by: Jeanelle Malling  Diabetes Management Assessment/Plan:      The following lipid goals have been established for the patient: Total cholesterol goal of 200; LDL cholesterol goal of 100; HDL cholesterol goal of 40; Triglyceride goal of 150.  Her blood pressure goal is < 130/80.   Clinical Lists Changes  Observations: Added new observation of EYES COMMENT: 09/2010 (11/10/2009 8:13) Added new observation of EYE EXAM BY: Jeanelle Malling (10/05/2009 8:14) Added new observation of DMEYEEXMRES: diabetic retinopathy- will be reffed to Steph[facility] Li for possible treatment (10/05/2009 8:14) Added new observation of DIAB EYE EX: diabetic retinopathy- will be reffed to Steph[facility] Li for possible treatment (10/05/2009 8:14)

## 2010-09-22 NOTE — Letter (Signed)
Summary: REPORT OF MEDICAL EXAM//PT TO PICK UP  REPORT OF MEDICAL EXAM//PT TO PICK UP   Imported By: Arta Bruce 05/25/2010 09:38:06  _____________________________________________________________________  External Attachment:    Type:   Image     Comment:   External Document

## 2010-09-22 NOTE — Assessment & Plan Note (Signed)
Summary: Hospital F/u   Vital Signs:  Patient profile:   50 year old female Menstrual status:  regular Height:      62 inches Weight:      184.7 pounds Temp:     98.2 degrees F oral Pulse rate:   88 / minute Pulse rhythm:   regular Resp:     16 per minute BP sitting:   126 / 72  (right arm) Cuff size:   regular  Vitals Entered By: Michelle Nasuti (May 02, 2010 4:32 PM)  CC: DM FOLLOW UP. PT C/O L SHOULDER PAIN X 2 MONTHS Is Patient Diabetic? Yes Pain Assessment Patient in pain? yes      Intensity: 5 CBG Result 152 CBG Device ID [id number]  Does patient need assistance? Functional Status Self care   CC:  DM FOLLOW UP. PT C/O L SHOULDER PAIN X 2 MONTHS.  History of Present Illness:  Pt into the office for hospital f/u on 04/12/2010 to 04/17/2010(full hospital d/c reviewed)  1.  Osteomyelitis and abscess of left first toe - Pt was started on doxycycline and Cipro as an outpt.  However, her condition worsened and therefore and she presented to the ER.  She was started on Vanc.  Dr. Lajoyce Corners consulted with pt and performed a left foot first toe amputation.  She was continued on cipro and vanc for another 2 days.  Dr. Orvan Falconer from infectious disease evaluated pt and decided to stop antibiotics at discharge (pt reports today that she has continued to take doxycycline as she already had it prior to hospital re-admission).  Pt was to f/u with Dr. Lajoyce Corners in one week following discharge (which she has done).  2. Diabetes - uncontrolled. ? if due to stress from surgery. Lantus changed to 24 units.  3.  Tobacco dependence - Pt was counseled about the need to quit smoking.  She would like to quit and speaks about it today in office.  She would like to try chantix.  4.  HTN - actually BP low during hospital stay. ? if due to narcotics. Lisinopril held but restarted prior to d/c when bp had improved  5.  TSH elevated at 5.237.  Free T4 normal.  ? elevation due to sick euthroid  syndrome  Diabetes Management History:      The patient is a 50 years old female who comes in for evaluation of Type 2 Diabetes Mellitus.  She has not been enrolled in the "Diabetic Education Program".  She states lack of understanding of dietary principles and is not following her diet appropriately.  Sensory loss is noted.  Self foot exams are being performed.  She is not checking home blood sugars.  She says that she is not exercising regularly.        Hypoglycemic symptoms are not occurring.  No hyperglycemic symptoms are reported.        No changes have been made to her treatment plan since last visit.    Habits & Providers  Alcohol-Tobacco-Diet     Alcohol drinks/day: 0     Tobacco Status: current     Tobacco Counseling: to quit use of tobacco products     Cigarette Packs/Day: 1  Exercise-Depression-Behavior     Does Patient Exercise: no     Depression Counseling: further diagnostic testing and/or other treatment is indicated     Drug Use: no     Seat Belt Use: 100     Sun Exposure: occasionally  Current Medications (verified):  1)  Metformin Hcl 1000 Mg Tabs (Metformin Hcl) .Marland Kitchen.. 1 Tablet By Mouth Two Times A Day For Blood Sugar 2)  Glipizide Xl 10 Mg Xr24h-Tab (Glipizide) .... One Tablet By Mouth Daily For Blood Sugar 3)  Prodigy Blood Glucose Monitor W/device Kit (Blood Glucose Monitoring Suppl) .... Dispense Glucometer To Check Blood Sugar 4)  Aspirin Ec Lo-Dose 81 Mg  Tbec (Aspirin) .Marland Kitchen.. 1 Tablet By Mouth Daily 5)  Prodigy Blood Glucose Test  Strp (Glucose Blood) .... Use To Check Blood Sugar Twice Daily  Dx 250.02 6)  Prodigy Twist Top Lancets 28g  Misc (Lancets) .... Use To Check Blood Sugar Twice Per Day  Dx 250.02 7)  Lac-Hydrin 12 % Lotn (Ammonium Lactate) .... One Application Topically To Affected Area Daily 8)  Gemfibrozil 600 Mg Tabs (Gemfibrozil) .... One Tablet By Mouth Two Times A Day 30 Minutes Before Breakfast and Dinner 9)  Lexapro 10 Mg Tabs (Escitalopram  Oxalate) .... One Tablet By Mouth Daily 10)  Lisinopril 5 Mg Tabs (Lisinopril) .... One Tablet By Mouth Daily 11)  Gabapentin 300 Mg Caps (Gabapentin) .... One Capsule By Mouth Three Times A Day 12)  Lantus 100 Unit/ml Soln (Insulin Glargine) .... 22 Units At Night Subcutaneously For Diabetes 13)  Novolog 100 Unit/ml Soln (Insulin Aspart) .Marland Kitchen.. 120 To 150: 2 Units 151 To 200: 4 Units 201 To 250: 6 Units 251 To 300: 8 Units 301 To 350: 10 Units 351 To 400: 12 Units > 400 13 Units 14)  Santyl 250 Unit/gm Oint (Collagenase) .... Use To Affected Area Daily During Dressing Change 15)  Insulin Syringe/needle 28g X 1/2" 1 Ml Misc (Insulin Syringe-Needle U-100) .... Use For Insulin Four Times Per Day 16)  Insulin Syringe 30g X 5/16" 0.5 Ml Misc (Insulin Syringe-Needle U-100) .... Use With Insulin Four Times Per Day  Allergies (verified): 1)  ! Penicillin  Review of Systems CV:  Denies chest pain or discomfort. Resp:  Denies cough. GI:  Denies abdominal pain, nausea, and vomiting. MS:  Complains of muscle; left shoulder - s/p a fall 2-3 months ago and since that time she has had problems with left shoulder.  initially she was unable to reach up and comb her hair, that has improved only slightly as she still has some limitations with ROM. unable to rest comfortably at night.  Physical Exam  General:  alert.   Head:  normocephalic.   Lungs:  normal breath sounds.   Heart:  normal rate and regular rhythm.   Msk:  normal ROM.   Neurologic:  alert & oriented X3.   Skin:  left great foot - sutures in place middle of wound - slight erythema and drainage but otherwise well approximated Psych:  Oriented X3.     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 Audrie Lia instruction  advised her to f/u with him as ordered  Problem # 3:  DIABETES MELLITUS, TYPE II (ICD-250.00) needs better control spoke with pt about keeping insulin in a cool place to be sure it maintains efficacy Her updated  medication list for this problem includes:    Metformin Hcl 1000 Mg Tabs (Metformin hcl) .Marland Kitchen... 1 tablet by mouth two times a day for blood sugar    Glipizide Xl 10 Mg Xr24h-tab (Glipizide) ..... One tablet by mouth daily for blood sugar    Aspirin Ec Lo-dose 81 Mg Tbec (Aspirin) .Marland Kitchen... 1 tablet by mouth daily    Lisinopril 5 Mg Tabs (Lisinopril) ..... One tablet by mouth daily    Lantus 100 Unit/ml Soln (Insulin glargine) .Marland Kitchen... 24 units at night subcutaneously for diabetes    Novolog 100 Unit/ml Soln (Insulin aspart) .Marland Kitchen... 120 to 150: 2 units 151 to 200: 4 units 201 to 250: 6 units 251 to 300: 8 units 301 to 350: 10 units 351 to 400: 12 units > 400 13 units  Orders: Capillary Blood Glucose/CBG (84696)  Problem # 4:  TOBACCO ABUSE (ICD-305.1) pt would like to quit will prescribe chantix Her updated medication list for this problem includes:    Chantix Starting Month Pak 0.5 Mg X 11 & 1 Mg X 42 Tabs (Varenicline tartrate) .Marland Kitchen... Take as directed  Problem # 5:  SHOULDER INJURY, LEFT (ICD-959.2) most likely from fall. if still problematic at next visit will consider imaging - ? rotator cuff injury  Complete Medication List: 1)  Metformin Hcl 1000 Mg Tabs (Metformin hcl) .Marland Kitchen.. 1 tablet by mouth two times a day for blood sugar 2)  Glipizide Xl 10 Mg Xr24h-tab (Glipizide) .... One tablet by mouth daily for blood sugar 3)  Prodigy Blood Glucose Monitor W/device Kit (Blood glucose monitoring suppl) .... Dispense glucometer to check blood sugar 4)  Aspirin Ec Lo-dose 81 Mg Tbec (Aspirin) .Marland Kitchen.. 1 tablet by mouth daily 5)  Prodigy Blood Glucose Test Strp  (Glucose blood) .... Use to check blood sugar twice daily  dx 250.02 6)  Prodigy Twist Top Lancets 28g Misc (Lancets) .... Use to check blood sugar twice per day  dx 250.02 7)  Lac-hydrin 12 % Lotn (Ammonium lactate) .... One application topically to affected area daily 8)  Gemfibrozil 600 Mg Tabs (Gemfibrozil) .... One tablet by mouth two times a day 30 minutes before breakfast and dinner 9)  Lexapro 10 Mg Tabs (Escitalopram oxalate) .... One tablet by mouth daily 10)  Lisinopril 5 Mg Tabs (Lisinopril) .... One tablet by mouth daily 11)  Gabapentin 300 Mg Caps (Gabapentin) .... One capsule by mouth three times a day 12)  Lantus 100 Unit/ml Soln (Insulin glargine) .... 24 units at night subcutaneously for diabetes 13)  Novolog 100 Unit/ml Soln (Insulin aspart) .Marland Kitchen.. 120 to 150: 2 units 151 to 200: 4 units 201 to 250: 6 units 251 to 300: 8 units 301 to 350: 10 units 351 to 400: 12 units > 400 13 units 14)  Insulin Syringe/needle 28g X 1/2" 0.5 Ml Misc (Insulin syringe-needle u-100) .... Use for insulin four times per day dx: 250.02 15)  Furosemide 20 Mg Tabs (Furosemide) .... One tablet by mouth daily as needed for fluid 16)  Chantix Starting Month Pak 0.5 Mg X 11 & 1 Mg X 42 Tabs (Varenicline tartrate) .... Take as directed  Diabetes Management Assessment/Plan:      The following lipid goals have been established for the patient: Total cholesterol goal of 200; LDL cholesterol goal of 100; HDL cholesterol goal of 40; Triglyceride goal of 150.  Her blood pressure goal is < 130/80.    Current Insulin Use:    Short Acting Insulin:  Type:  Novolin Regular       Dosage:  sliding scale units at breakfast; sliding scale units at lunch; sliding scale units at dinner;     Longer Acting Insulin:       Type:  Lantus       Dosage:  24 units at bedtime.   Patient Instructions: 1)  Keep your appointment with Dr. Lajoyce Corners as scheduled on Friday. 2)  Fluid - take lasix by mouth daily x 3 days then  reassess.  Elevated leg during the day 3)  Left shoulder may be due to sprained rotator cuff during the fall. Apply heat.  May also take aleve over the counter. 4)  Follow up in 4 weeks with n.martin,fnp for diabetes. Prescriptions: INSULIN SYRINGE/NEEDLE 28G X 1/2" 0.5 ML MISC (INSULIN SYRINGE-NEEDLE U-100) Use for insulin four times per day DX: 250.02  #100 x 5   Entered and Authorized by:   Lehman Prom FNP   Signed by:   Lehman Prom FNP on 05/02/2010   Method used:   Print then Give to Patient   RxID:   [id number] CHANTIX STARTING MONTH PAK 0.5 MG X 11 & 1 MG X 42 TABS (VARENICLINE TARTRATE) Take as directed  #1 month qs x 0   Entered and Authorized by:   Lehman Prom FNP   Signed by:   Lehman Prom FNP on 05/02/2010   Method used:   Print then Give to Patient   RxID:   [id number] FUROSEMIDE 20 MG TABS (FUROSEMIDE) One tablet by mouth daily as needed for fluid  #10 x 0   Entered and Authorized by:   Lehman Prom FNP   Signed by:   Lehman Prom FNP on 05/02/2010   Method used:   Print then Give to Patient   RxID:   [id number] INSULIN SYRINGE/NEEDLE 28G X 1/2" 0.5 ML MISC (INSULIN SYRINGE-NEEDLE U-100) Use for insulin four times per day DX: 250.02  #100 x 5   Entered and Authorized by:   Lehman Prom FNP   Signed by:   Lehman Prom FNP on 05/02/2010   Method used:   Electronically to        [facility] [address] [phone]* (retail)       [address]       [place], [place]  [address]       Ph: [phone] or [phone]       Fax: [phone]   RxID:   [id number]    CXR  Procedure date:  04/14/2010  Findings:      no acute cardiopulmonary disease  X-ray  Procedure date:  04/13/2010  Findings:      left foot - osteomyelitis of the left great toe phalanges and a pathological fracture of the proximal phalanx   CXR  Procedure date:  04/14/2010  Findings:      no acute cardiopulmonary  disease  X-ray  Procedure date:  04/13/2010  Findings:      left foot - osteomyelitis of the left great toe phalanges and a pathological fracture of the proximal phalanx

## 2010-09-22 NOTE — Assessment & Plan Note (Signed)
Summary: Diabetes   Vital Signs:  Patient profile:   50 year old female Menstrual status:  regular LMP:     05/2010 Temp:     97.6 degrees F oral Pulse rate:   60 / minute Pulse rhythm:   regular Resp:     20 per minute BP sitting:   140 / 80  (left arm) Cuff size:   regular  Vitals Entered By: Levon Hedger (July 07, 2010 12:45 PM) CC: swelling in feet and legs Is Patient Diabetic? Yes Pain Assessment Patient in pain? yes     Location: feet, ankles Intensity: 7 Type: burning CBG Result 245 CBG Device ID [id number]  Does patient need assistance? Functional Status Self care Ambulation Wheelchair LMP (date): 05/2010 LMP - Character: normal     Enter LMP: 05/2010 Last PAP Result  Specimen Adequacy: Satisfactory for evaluation.   Interpretation/Result:Negative for intraepithelial Lesion or Malignancy.      CC:  swelling in feet and legs.  History of Present Illness:  Pt into the office for f/u on diabetes and foot ulcers. On her last visit here she was s/p amputation of great toe. She has since had left foot - all toes amputation. She went to a nursing home for rehab. She was discharged earlier this week.  Also pt presents today with swelling and tenderness in the left foot. Pt reports that she tried to even up a toe nail on her   Pt has applied silvadine (or so she thinks as prescribed - she does not have it with her)  Diet: No added salt, low concentrated sweets    Medications Prior to Update: 1)  Metformin Hcl 1000 Mg Tabs (Metformin Hcl) .Marland Kitchen.. 1 Tablet By Mouth Two Times A Day For Blood Sugar 2)  Glipizide Xl 10 Mg Xr24h-Tab (Glipizide) .... One Tablet By Mouth Daily For Blood Sugar 3)  Prodigy Blood Glucose Monitor W/device Kit (Blood Glucose Monitoring Suppl) .... Dispense Glucometer To Check Blood Sugar 4)  Aspirin Ec Lo-Dose 81 Mg  Tbec (Aspirin) .Marland Kitchen.. 1 Tablet By Mouth Daily 5)  Prodigy Blood Glucose Test  Strp (Glucose Blood) .... Use To Check Blood  Sugar Twice Daily  Dx 250.02 6)  Prodigy Twist Top Lancets 28g  Misc (Lancets) .... Use To Check Blood Sugar Twice Per Day  Dx 250.02 7)  Lac-Hydrin 12 % Lotn (Ammonium Lactate) .... One Application Topically To Affected Area Daily 8)  Gemfibrozil 600 Mg Tabs (Gemfibrozil) .... One Tablet By Mouth Two Times A Day 30 Minutes Before Breakfast and Dinner 9)  Lexapro 10 Mg Tabs (Escitalopram Oxalate) .... One Tablet By Mouth Daily 10)  Lisinopril 5 Mg Tabs (Lisinopril) .... One Tablet By Mouth Daily 11)  Gabapentin 300 Mg Caps (Gabapentin) .... One Capsule By Mouth Three Times A Day 12)  Lantus 100 Unit/ml Soln (Insulin Glargine) .... 24 Units At Night Subcutaneously For Diabetes 13)  Novolog 100 Unit/ml Soln (Insulin Aspart) .Marland Kitchen.. 120 To 150: 2 Units 151 To 200: 4 Units 201 To 250: 6 Units 251 To 300: 8 Units 301 To 350: 10 Units 351 To 400: 12 Units > 400 13 Units 14)  Insulin Syringe/needle 28g X 1/2" 0.5 Ml Misc (Insulin Syringe-Needle U-100) .... Use For Insulin Four Times Per Day Dx: 250.02 15)  Furosemide 20 Mg Tabs (Furosemide) .... One Tablet By Mouth Daily As Needed For Fluid 16)  Chantix Starting Month Pak 0.5 Mg X 11 & 1 Mg X 42 Tabs (Varenicline Tartrate) .... Take As  Directed  Current Medications (verified): 1)  Metformin Hcl 1000 Mg Tabs (Metformin Hcl) .Marland Kitchen.. 1 Tablet By Mouth Two Times A Day For Blood Sugar 2)  Glipizide Xl 10 Mg Xr24h-Tab (Glipizide) .... One Tablet By Mouth Daily For Blood Sugar 3)  Prodigy Blood Glucose Monitor W/device Kit (Blood Glucose Monitoring Suppl) .... Dispense Glucometer To Check Blood Sugar 4)  Aspirin Ec Lo-Dose 81 Mg  Tbec (Aspirin) .Marland Kitchen.. 1 Tablet By Mouth Daily 5)  Prodigy Blood Glucose Test  Strp (Glucose Blood) .... Use To Check Blood Sugar Twice Daily  Dx 250.02 6)  Prodigy Twist Top Lancets 28g  Misc (Lancets) .... Use To Check Blood Sugar Twice Per Day  Dx 250.02 7)  Lac-Hydrin 12 % Lotn (Ammonium Lactate) .... One Application Topically To Affected  Area Daily 8)  Gemfibrozil 600 Mg Tabs (Gemfibrozil) .... One Tablet By Mouth Two Times A Day 30 Minutes Before Breakfast and Dinner 9)  Lexapro 10 Mg Tabs (Escitalopram Oxalate) .... One Tablet By Mouth Daily 10)  Lisinopril 5 Mg Tabs (Lisinopril) .... One Tablet By Mouth Daily 11)  Gabapentin 300 Mg Caps (Gabapentin) .... One Capsule By Mouth Three Times A Day 12)  Lantus 100 Unit/ml Soln (Insulin Glargine) .... 24 Units At Night Subcutaneously For Diabetes 13)  Novolog 100 Unit/ml Soln (Insulin Aspart) .Marland Kitchen.. 120 To 150: 2 Units 151 To 200: 4 Units 201 To 250: 6 Units 251 To 300: 8 Units 301 To 350: 10 Units 351 To 400: 12 Units > 400 13 Units 14)  Insulin Syringe/needle 28g X 1/2" 0.5 Ml Misc (Insulin Syringe-Needle U-100) .... Use For Insulin Four Times Per Day Dx: 250.02 15)  Furosemide 20 Mg Tabs (Furosemide) .... One Tablet By Mouth Daily As Needed For Fluid 16)  Chantix Starting Month Pak 0.5 Mg X 11 & 1 Mg X 42 Tabs (Varenicline Tartrate) .... Take As Directed  Allergies (verified): 1)  ! Penicillin  Review of Systems CV:  Denies chest pain or discomfort. Resp:  Denies cough. GI:  Denies constipation. Derm:  Complains of lesion(s) and poor wound healing.  Physical Exam  General:  alert.   Head:  normocephalic.   Lungs:  normal breath sounds.   Heart:  normal rate and regular rhythm.   Abdomen:  normal bowel sounds.   Neurologic:  alert & oriented X3.   Skin:  left foot - s/p great toe amputation - incision well healed and approximated all other digits swollen Right toe - s/p all metatarsal removal incision with yellow exudate. drainage Psych:  Oriented X3.     Impression & Recommendations:  Problem # 1:  DIABETES MELLITUS, TYPE II (ICD-250.00)  Her updated medication list for this problem includes:    Metformin Hcl 1000 Mg Tabs (Metformin hcl) .Marland Kitchen... 1 tablet by mouth two times a day for blood sugar    Glipizide Xl 10 Mg Xr24h-tab (Glipizide) ..... One tablet by mouth  daily for blood sugar    Aspirin Ec Lo-dose 81 Mg Tbec (Aspirin) .Marland Kitchen... 1 tablet by mouth daily    Lisinopril 5 Mg Tabs (Lisinopril) ..... One tablet by mouth daily    Lantus 100 Unit/ml Soln (Insulin glargine) .Marland Kitchen... 24 units at night subcutaneously for diabetes    Novolog 100 Unit/ml Soln (Insulin aspart) .Marland Kitchen... 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 1 tablet by mouth two times a day for blood sugar 2)  Glipizide Xl 10 Mg Xr24h-tab (Glipizide) .... One tablet by mouth daily for blood sugar 3)  Prodigy Blood Glucose Monitor W/device Kit (Blood glucose monitoring suppl) .... Dispense glucometer to check blood sugar 4)  Aspirin Ec Lo-dose 81 Mg Tbec (Aspirin) .Marland Kitchen.. 1 tablet by mouth daily 5)  Prodigy Blood Glucose Test Strp (Glucose blood) .... Use to check blood sugar twice daily  dx 250.02 6)  Prodigy Twist Top Lancets 28g Misc (Lancets) .... Use to check blood sugar twice per day  dx 250.02 7)  Lovaza 1 Gm Caps (Omega-3-acid ethyl esters) .... One capsule by mouth two times a day 8)  Lexapro 10 Mg Tabs (Escitalopram oxalate) .... One tablet by mouth daily 9)  Lisinopril 5 Mg Tabs (Lisinopril) .... One tablet by mouth daily 10)  Gabapentin 100 Mg Caps (Gabapentin) .... One capsule by mouth three times a day 11)  Lantus 100 Unit/ml Soln (Insulin glargine) .... 24 units at night subcutaneously for diabetes 12)  Novolog 100 Unit/ml Soln (Insulin aspart) .... 201 to 250: 2 units 251 to 300: 4 units 301 to 350: 6 units 351 to 400: 8 units > 400  10units 13)  Insulin Syringe/needle 28g X 1/2" 0.5 Ml Misc (Insulin syringe-needle u-100) .... Use for insulin four times per day dx: 250.02 14)  Furosemide 20 Mg Tabs (Furosemide) .... One tablet by mouth daily x 3 days then as needed 15)  Tramadol Hcl 50 Mg Tabs (Tramadol hcl) .... One tablet by mouth two times a day as needed for pain 16)  Probiotic Caps (Probiotic product) .... One capsule by mouth daily  Other Orders: Furosemide- Lasix Injection (J1940) Admin of Therapeutic Inj  intramuscular or subcutaneous (16606)  Patient Instructions: 1)  You will be given an injection of medication today. 2)  Take lasix 20mg  by mouth daily x 3 days. 3)  use the new sliding scale for insulin 4)  Keep next scheduled follow up appointment Prescriptions: FUROSEMIDE 20 MG TABS (FUROSEMIDE) One tablet by mouth daily x 3 days then as needed  #30 x 0   Entered and Authorized by:   Lehman Prom FNP   Signed by:   Lehman Prom FNP on 07/07/2010   Method used:   Print then Give to Patient   RxID:   [id number]    Medication Administration  Injection # 1:    Medication: Furosemide- Lasix Injection    Diagnosis: LEG EDEMA (ICD-782.3)    Route: IM    Site: RUOQ gluteus    Exp Date: 06/22/2011    Lot #: [id number]    Mfr: Hospira    Comments: Gave 20 mg    Patient tolerated injection without complications    Given by: Hale Drone CMA (July 07, 2010 2:08 PM)  Orders Added: 1)  Capillary Blood Glucose/CBG [82948] 2)  Est. Patient Level III [06237] 3)  Furosemide- Lasix Injection [J1940] 4)  Admin of Therapeutic Inj  intramuscular or subcutaneous [96372] 5)  Hemoglobin A1C [83036] 6)  Ace Wraps 3-5 in/yard  [S2831]    Laboratory Results   Blood Tests   Date/Time Received: July 07, 2010 1:08  PM   HGBA1C: 8.5%   (Normal Range: Non-Diabetic - 3-6%   Control Diabetic - 6-8%) CBG Random:: 245

## 2010-09-22 NOTE — Letter (Signed)
Summary: Discharge Summary  Discharge Summary   Imported By: Arta Bruce 06/17/2010 15:56:49  _____________________________________________________________________  External Attachment:    Type:   Image     Comment:   External Document

## 2010-09-22 NOTE — Letter (Signed)
Summary: MAILED REQUESTED RECORDS TO [facility]  MAILED REQUESTED RECORDS TO [facility]   Imported By: Arta Bruce 09/05/2010 16:59:28  _____________________________________________________________________  External Attachment:    Type:   Image     Comment:   External Document

## 2010-09-22 NOTE — Progress Notes (Signed)
Summary: med refills  Phone Note Refill Request   Refills Requested: Medication #1:  METFORMIN HCL 1000 MG TABS 1 tablet by mouth two times a day for blood sugar  Medication #2:  GLIPIZIDE XL 10 MG XR24H-TAB One tablet by mouth daily for blood sugar  Medication #3:  NOVOLOG 100 UNIT/ML SOLN 120 to 150: 2 units 151 to 200: 4 units 201 to 250: 6 units 251 to 300: 8 units 301 to 350: 10 units 351 to 400: 12 units > 400 13 units  Medication #4:  LANTUS 100 UNIT/ML SOLN 22 units at night subcutaneously for diabetes Pt requesting above meds and also lisinopril, gemfribrozil and  lexapro.   [facility]  Pt can be reached at [phone].  Initial call taken by: Vesta Mixer CMA,  April 21, 2010 5:02 PM  Follow-up for Phone Call        notify pt that meds have been sent to the pharmacy Follow-up by: Lehman Prom FNP,  April 21, 2010 5:13 PM  Additional Follow-up for Phone Call Additional follow up Details #1::        pt informed of above information.  Additional Follow-up by: Levon Hedger,  April 22, 2010 9:44 AM    Prescriptions: NOVOLOG 100 UNIT/ML SOLN (INSULIN ASPART) 120 to 150: 2 units 151 to 200: 4 units 201 to 250: 6 units 251 to 300: 8 units 301 to 350: 10 units 351 to 400: 12 units > 400 13 units  #2 vials x 5   Entered and Authorized by:   Lehman Prom FNP   Signed by:   Lehman Prom FNP on 04/21/2010   Method used:   [facility]        [facility]  [address] [phone]* (retail)       [address]       [place], [place]  [address]       Ph: [phone] or [phone]       Fax: [phone]   RxID:   [id number] LANTUS 100 UNIT/ML SOLN (INSULIN GLARGINE) 22 units at night subcutaneously for diabetes  #1 month qs x 5   Entered and Authorized by:   Lehman Prom FNP   Signed by:   Lehman Prom FNP on 04/21/2010   Method used:   Electronically to        [facility] [address] [phone]* (retail)       [address]       [place], [place]  [address]       Ph: [phone] or [phone]       Fax: [phone]   RxID:   [id number] GLIPIZIDE XL 10 MG XR24H-TAB (GLIPIZIDE) One tablet by mouth daily for blood sugar  #30 x 5   Entered and Authorized by:   Lehman Prom FNP   Signed by:   Lehman Prom FNP on 04/21/2010   Method used:   Electronically to        [facility] [address] [phone]* (retail)       [address]       [place], [place]  [address]       Ph: [phone] or [phone]       Fax: [phone]   RxID:   [id number] METFORMIN HCL 1000 MG TABS (METFORMIN HCL) 1 tablet by mouth two times a day for  blood sugar  #60 x 5   Entered and Authorized by:   Lehman Prom FNP   Signed by:   Lehman Prom FNP on 04/21/2010   Method used:   Electronically to        [facility] [address] [phone]* (retail)       [address]       [place], [place]  [address]       Ph: [phone] or [phone]       Fax: [phone]   RxID:   [id number]

## 2010-09-22 NOTE — Progress Notes (Signed)
Summary: Med refills  Phone Note Call from Patient   Summary of Call:  Ronney Lion from [facility] call pt  needs a rx for lovaza  1 gr, 2  pill twice  a day.   phone [phone] Initial call taken by: Domenic Polite,  July 06, 2010 3:19 PM  Follow-up for Phone Call        Aggie Cosier did you call to check on this? why is [facility] calling for an RX....Marland Kitchen All meds sent to the pharmacy she reported to use in 04/2010 so unsure why she needs just this one Rx from [facility] Follow-up by: Lehman Prom FNP,  July 06, 2010 4:08 PM  Additional Follow-up for Phone Call Additional follow up Details #1::        Left message on answer machine for Judeth Cornfield to return call. Gaylyn Cheers RN  July 08, 2010 5:03 PM  Pt into the office on 07/08/2010 - reports that she is just getting out of a nursing home.  [facility] is assisting with her care.  When you speak with Judeth Cornfield ask - Does she needs any other Rx and to what pharmacy should the Rx be sent n.martin,fnp July 11, 2010  8:47 AM       Additional Follow-up for Phone Call Additional follow up Details #2::    Left message on answering machine for Judeth Cornfield to return call.  Dutch Quint RN  July 12, 2010 12:01 PM  [facility] [phone].  Judeth Cornfield says she only needs the lovaza.  All her other medication was unchanged and she has refills available, so they were already called in.  Lovaza was the only thing that was changed.  Dutch Quint RN  July 12, 2010 12:09 PM   Additional Follow-up for Phone Call Additional follow up Details #3:: Details for Additional Follow-up Action Taken: [facility] pt to pick up from there Additional Follow-up by: Lehman Prom FNP,  July 12, 2010 5:18 PM  Prescriptions: LOVAZA 1 GM CAPS (OMEGA-3-ACID ETHYL ESTERS) One capsule by mouth two times a day  #60 x 5   Entered and Authorized by:   Lehman Prom FNP   Signed by:   Lehman Prom FNP on 07/12/2010  Method used:   Electronically to        [facility] [address] [phone]* (retail)       [address]       [place], [place]  [address]       Ph: [phone] or [phone]       Fax: [phone]   RxID:   [id number]

## 2010-09-22 NOTE — Letter (Signed)
Summary: NUTRITION SUMMARY/SUSIE  NUTRITION SUMMARY/SUSIE   Imported By: Arta Bruce 10/13/2009 12:47:46  _____________________________________________________________________  External Attachment:    Type:   Image     Comment:   External Document

## 2010-09-22 NOTE — Progress Notes (Signed)
Summary: LEXAPRO REFILL  Phone Note Call from Patient Call back at [phone]    Reason for Call: Refill Medication Summary of Call: PT NEED A REFILL OF LEXAPRO & YEAST INFECTION MED . [facility] [place] [facility] PLEASE, CALL HER @ [phone] . Initial call taken by: Cheryll Dessert,  March 01, 2010 4:48 PM  Follow-up for Phone Call        forward to N. Martin,fnp Follow-up by: Levon Hedger,  March 01, 2010 4:53 PM  Additional Follow-up for Phone Call Additional follow up Details #1::        lexapro refilled as reviewed with pt before her blood sugar is elevated so she will continue to get yeast infections.  she needs to be sure she is taking her diabetes meds.  she can try otc yeast meds Additional Follow-up by: Lehman Prom FNP,  March 02, 2010 8:09 AM    Additional Follow-up for Phone Call Additional follow up Details #2::    pt informed of above.  [facility]   Prescriptions: LEXAPRO 10 MG TABS (ESCITALOPRAM OXALATE) One tablet by mouth daily  #30 x 3   Entered and Authorized by:   Lehman Prom FNP   Signed by:   Lehman Prom FNP on 03/02/2010   Method used:   Electronically to        [facility] [address] [phone]* (retail)       [address]       [place], [place]  [address]       Ph: [phone] or [phone]       Fax: [phone]   RxID:   [id number]

## 2010-09-22 NOTE — Assessment & Plan Note (Signed)
Summary: Diabetes   Vital Signs:  Patient profile:   50 year old female Menstrual status:  regular LMP:     11/2009 Weight:      185.3 pounds BMI:     34.01 Temp:     97.4 degrees F oral Pulse rate:   75 / minute Pulse rhythm:   regular Resp:     20 per minute BP sitting:   140 / 85  (left arm) Cuff size:   regular  Vitals Entered By: Levon Hedger (February 01, 2010 9:16 AM) CC: mosquito bites that are infected...pain in right hip that bothers her when she gets up and down...vaginal itching, burning, discharge x 2 weeks, Depression Is Patient Diabetic? Yes Pain Assessment Patient in pain? no      CBG Result 274 CBG Device ID B  Does patient need assistance? Functional Status Self care Ambulation Normal LMP (date): 11/2009 LMP - Character: normal     Enter LMP: 11/2009 Last PAP Result  Specimen Adequacy: Satisfactory for evaluation.   Interpretation/Result:Negative for intraepithelial Lesion or Malignancy.      CC:  mosquito bites that are infected...pain in right hip that bothers her when she gets up and down...vaginal itching, burning, discharge x 2 weeks, and Depression.  History of Present Illness:  Pt into the office with several complaints  Pt has not taken any medications for the past month.  Reports that she has "mixed her medications up" in a month container and she did not know which was which.  Social - pt has been unemployed for the past year. 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 She has not been able to afford her medications.  Optho - laser surgery since last visit in this office to left eye she is pending surgery to the right eye  Depression History:      The patient presents with symptoms of depression which have been present for greater than two weeks.  The patient is having a depressed mood most of the day and has a diminished interest in her usual daily activities.  Positive alarm features for depression include fatigue (loss of energy).   However, she denies recurrent thoughts of death or suicide.        Psychosocial stress factors include major life changes.  The patient denies that she feels like life is not worth living, denies that she wishes that she were dead, and denies that she has thought about ending her life.  Her current symptoms often keep her from doing the things she needs to do.         Depression Treatment History:  Prior Medication Used:   Start Date: Assessment of Effect:   Comments:  lexapro     02/01/2010     --       started  Diabetes Management History:      The patient is a 50 years old female who comes in for evaluation of Type 2 Diabetes Mellitus.  She has not been enrolled in the "Diabetic Education Program".  She states lack of understanding of dietary principles and is not following her diet appropriately.  No sensory loss is reported.  Self foot exams are not being performed.  She is not checking home blood sugars.  She says that she is not exercising regularly.        Hypoglycemic symptoms are not occurring.  No hyperglycemic symptoms are reported.        Symptoms which suggest diabetic complications include vision  problems.  Other questions/concerns include: Pt has an eye appt on this week.  The following changes have been made to her treatment plan since last visit: medication changes.  Treatment plan changes were initiated by patient.      Habits & Providers  Alcohol-Tobacco-Diet     Alcohol drinks/day: 0     Tobacco Status: current     Tobacco Counseling: to quit use of tobacco products     Cigarette Packs/Day: 1  Exercise-Depression-Behavior     Does Patient Exercise: no     Have you felt down or hopeless? yes     Have you felt little pleasure in things? yes     Depression Counseling: further diagnostic testing and/or other treatment is indicated     Drug Use: no     Seat Belt Use: 100     Sun Exposure: occasionally  Allergies (verified): 1)  ! Penicillin  Review of Systems CV:   Denies chest pain or discomfort. Resp:  Denies cough. GI:  Denies abdominal pain, nausea, and vomiting. GU:  Complains of dysuria and urinary frequency. MS:  Complains of joint pain; right hip pain.  Physical Exam  General:  alert.   Head:  normocephalic.   Lungs:  normal breath sounds.   Heart:  normal rate and regular rhythm.   Skin:  multiple skin lesions - left hand, left calf - yellow crusted lesions Psych:  Oriented X3.    Diabetes Management Exam:    Foot Exam (with socks and/or shoes not present):       Inspection:          Left foot: abnormal             Comments: left 5th toe - nail intact but trauma to surrounding skin, bleeding          Right foot: normal       Nails:          Left foot: normal          Right foot: normal   Impression & Recommendations:  Problem # 1:  DIABETES MELLITUS, TYPE II (ICD-250.00) Pt has NOT been taking her medications will do send off hgba1c but assurdly will be hight advised pt that elevated blood sugar leads to other problems in her body Her updated medication list for this problem includes:    Metformin Hcl 1000 Mg Tabs (Metformin hcl) .Marland Kitchen... 1 tablet by mouth two times a day for blood sugar    Actos 45 Mg Tabs (Pioglitazone hcl) .Marland Kitchen... 1 tablet by mouth daily for diabetes    Aspirin Ec Lo-dose 81 Mg Tbec (Aspirin) .Marland Kitchen... 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 Tried to review this again with pt Her updated medication list for this problem includes:    Fluconazole 100 Mg Tabs (Fluconazole) ..... One tablet by mouth daily x 3 doses  Problem # 3:  ACUTE CYSTITIS (ICD-595.0) will send urine for culture The following medications were removed from the medication list:    Sanctura Xr 60 Mg Xr24h-cap (Trospium chloride) ..... One  capsule by mouth daily    Azithromycin 500 Mg Tabs (Azithromycin) .Marland Kitchen... 2 tablets by mouth on day 1 then one tablet by mouth daily Her updated medication list for this problem includes:    Macrobid 100 Mg Caps (Nitrofurantoin  monohyd macro) ..... One capsule by mouth two times a day for infection  Orders: UA Dipstick w/o Micro (manual) (40981) KOH/ WET Mount [phone]) T-Culture, Urine ([phone])  Problem # 4:  DEPRESSION (ICD-311) samples of lexapro given in office Her updated medication list for this problem includes:    Lexapro 10 Mg Tabs (Escitalopram oxalate) ..... One tablet by mouth daily  Problem # 5:  SKIN LESION (ICD-709.9) likely due to poor healing from elevated blood sugars advised pt to keep areas clean and dry bacitracin ointment given in office  Complete Medication List: 1)  Metformin Hcl 1000 Mg Tabs (Metformin hcl) .Marland Kitchen.. 1 tablet by mouth two times a day for blood sugar 2)  Actos 45 Mg Tabs (Pioglitazone hcl) .Marland Kitchen.. 1 tablet by mouth daily for diabetes 3)  Prodigy Blood Glucose Monitor W/device Kit (Blood glucose monitoring suppl) .... Dispense glucometer to check blood sugar 4)  Aspirin Ec Lo-dose 81 Mg Tbec (Aspirin) .Marland Kitchen.. 1 tablet by mouth daily 5)  Prodigy Blood Glucose Test Strp (Glucose blood) .... Use to check blood sugar twice daily  dx 250.02 6)  Prodigy Twist Top Lancets 28g Misc (Lancets) .... Use to check blood sugar twice per day  dx 250.02 7)  Fluconazole 100 Mg Tabs (Fluconazole) .... One tablet by mouth daily x 3 doses 8)  Lac-hydrin 12 % Lotn (Ammonium lactate) .... One application topically to affected area daily 9)  Gemfibrozil 600 Mg Tabs (Gemfibrozil) .... One tablet by mouth two times a day 30 minutes before breakfast and dinner 10)  Macrobid 100 Mg Caps (Nitrofurantoin monohyd macro) .... One capsule by mouth two times a day for infection 11)  Lexapro 10 Mg Tabs (Escitalopram oxalate) .... One tablet by mouth daily  Diabetes Management  Assessment/Plan:      The following lipid goals have been established for the patient: Total cholesterol goal of 200; LDL cholesterol goal of 100; HDL cholesterol goal of 40; Triglyceride goal of 150.  Her blood pressure goal is < 130/80.    Patient Instructions: 1)  [facility] [place] 2)  Yeast infection and urine infection - likely due to elevated blood sugars 3)  Diabetes - you need to restart your metformin and actos.  Skin and urine infections will not improve unless you get your blood sugars down  4)  Depression - start lexapro 10mg  by mouth daily (samples given) 5)  Follow up in 4 weeks for diabetes and medication review (lexapro). Prescriptions: LEXAPRO 10 MG TABS (ESCITALOPRAM OXALATE) One tablet by mouth daily  #28 x 0   Entered and Authorized by:   Lehman Prom FNP   Signed by:   Lehman Prom FNP on 02/02/2010   Method used:   Samples Given   RxID:   [id number] MACROBID 100 MG CAPS (NITROFURANTOIN MONOHYD MACRO) One capsule by mouth two times a day for infection  #14 x 0   Entered and Authorized by:   Lehman Prom FNP   Signed by:   Lehman Prom FNP on 02/01/2010   Method used:   Electronically to        [facility] [address] [phone]* (retail)       [address]       [place], [place]  [address]       Ph: [phone] or [phone]       Fax: [phone]   RxID:   [id number] MACROBID 100 MG  CAPS (NITROFURANTOIN MONOHYD MACRO) One capsule by mouth two times a day for infection  #14 x 0   Entered and Authorized by:   Lehman Prom FNP   Signed by:   Lehman Prom FNP on 02/01/2010   Method used:   Print then Give to Patient   RxID:   [id number] GEMFIBROZIL 600 MG TABS (GEMFIBROZIL) One tablet by mouth two times a day 30 minutes before breakfast and dinner  #60 x 3   Entered and Authorized by:   Lehman Prom FNP   Signed by:   Lehman Prom FNP on 02/01/2010   Method used:    Electronically to        [facility]  [facility] [address] [phone]* (retail)       [address]       [place], [place]  [address]       Ph: [phone] or [phone]       Fax: [phone]   RxID:   [id number] FLUCONAZOLE 100 MG TABS (FLUCONAZOLE) One tablet by mouth daily x 3 doses  #3 x 0   Entered and Authorized by:   Lehman Prom FNP   Signed by:   Lehman Prom FNP on 02/01/2010   Method used:   Electronically to        [facility]  [facility] [address] [phone]* (retail)       [address]       [place], [place]  [address]       Ph: [phone] or [phone]       Fax: [phone]   RxID:   [id number] ACTOS 45 MG  TABS (PIOGLITAZONE HCL) 1 tablet by mouth daily for diabetes  #30 x 3   Entered and Authorized by:   Lehman Prom FNP   Signed by:   Lehman Prom FNP on 02/01/2010   Method used:   Electronically to        [facility]  [facility] [address] [phone]* (retail)       [address]       [place], [place]  [address]       Ph: [phone] or [phone]       Fax: [phone]   RxID:   [id number] METFORMIN HCL 1000 MG TABS (METFORMIN HCL) 1 tablet by mouth two times a day for blood sugar  #60 x 3   Entered and Authorized by:   Lehman Prom FNP   Signed by:   Lehman Prom FNP on 02/01/2010   Method used:   Electronically to        [facility]  [facility] [address] [phone]* (retail)       [address]       [place], [place]  [address]       Ph: [phone] or [phone]       Fax: [phone]   RxID:   [id number]   Laboratory Results   Urine Tests  Date/Time Received: February 01, 2010 9:33 AM   Routine Urinalysis   Color: lt. yellow Appearance: Clear Glucose: >=1000   (Normal Range: Negative) Bilirubin: negative   (Normal Range: Negative) Ketone: negative   (Normal Range: Negative) Spec. Gravity: >=1.030   (Normal Range: 1.003-1.035) Blood: trace-lysed   (Normal Range:  Negative) pH: 5.0   (Normal Range: 5.0-8.0) Protein: negative   (Normal Range: Negative) Urobilinogen: 0.2   (  Normal Range: 0-1) Nitrite: positive   (Normal Range: Negative) Leukocyte Esterace: negative   (Normal Range: Negative)     Blood Tests     CBG Random:: 274  Comments: HGBa1c not done in house   Wet Mount/KOH Source: vaginal WBC/hpf: 1-5 Bacteria/hpf: rare Clue cells/hpf: none Yeast/hpf: few Trichomonas/hpf: none     Laboratory Results   Urine Tests    Routine Urinalysis   Color: lt. yellow Appearance: Clear Glucose: >=1000   (Normal Range: Negative) Bilirubin: negative   (Normal Range: Negative) Ketone: negative   (Normal Range: Negative) Spec. Gravity: >=1.030   (Normal Range: 1.003-1.035) Blood: trace-lysed   (Normal Range: Negative) pH: 5.0   (Normal Range: 5.0-8.0) Protein: negative   (Normal Range: Negative) Urobilinogen: 0.2   (Normal Range: 0-1) Nitrite: positive   (Normal Range: Negative) Leukocyte Esterace: negative   (Normal Range: Negative)     Blood Tests     CBG Random:: 274mg /dL  Comments: [id number] not done in house   [facility] KOH: Negative

## 2010-09-22 NOTE — Assessment & Plan Note (Signed)
Summary: Hosptial F/u    Vital Signs:  Patient profile:   50 year old female Menstrual status:  regular Weight:      197.7 pounds BMI:     36.29 Temp:     98.1 degrees F oral Pulse rate:   66 / minute Pulse rhythm:   regular Resp:     16 per minute BP sitting:   111 / 67  (left arm) Cuff size:   regular  Vitals Entered By: Levon Hedger (March 25, 2010 11:54 AM)  Nutrition Counseling: Patient's BMI is greater than 25 and therefore counseled on weight management options. CC: both feet pain in big toe., Depression Is Patient Diabetic? Yes Pain Assessment Patient in pain? yes     Location: feet,legs Intensity: 5-6 Onset of pain  Constant CBG Result 136 CBG Device ID B  Does patient need assistance? Functional Status Self care Ambulation Normal   CC:  both feet pain in big toe. and Depression.  History of Present Illness:  Pt into the office for follow up on diabetes. Also s/p hospitalization  for bilateral great toe foot ulcers. (hospital D/C reviewed) She was treated with vancomycin ad cipro with good improvement. Last visit with [facility]  Care and hyperbaric center on 03/24/2010  [address] III ucer on the left with penetration to the lateral tendon of the great toe and Wagner II lesion on the right great toe. Pt is on Cipro 500mg  by mouth two times a day (which she presents with today) and Doxycycline 100mg  by mouth two times a day (which she presents with today) Next visit at the wound center is in 1 week. She will be worked up for the possibliity of hyperbaric oxygen treatment She is to get dressing change supplies and start daily dressing changes at home. Pt has not been back to work since the hospitalization for the ulcers.  She was required to wear steel toe boots at her job which may have led to the ulcers.  Pt reports that she has an appt today for an MRI to check for osteomyelitis  Diabetes - poorly controlled for months.  Insulin started in the hospital. Pt is  now checking her blood sugars four times per day but she did not bring those values into the office with her today. Actos stopped due to edema  Depression History:      Positive alarm features for depression include fatigue (loss of energy).        The patient denies that she feels like life is not worth living, denies that she wishes that she were dead, and denies that she has thought about ending her life.         Depression Treatment History:  Prior Medication Used:   Start Date: Assessment of Effect:   Comments:  lexapro     02/01/2010   some improvement     started  Diabetes Management History:      The patient is a 50 years old female who comes in for evaluation of Type 2 Diabetes Mellitus.  She has not been enrolled in the "Diabetic Education Program".  She states understanding of dietary principles but she is not following the appropriate diet.  Sensory loss is noted.  Self foot exams are not being performed.  She is not checking home blood sugars.  She says that she is not exercising regularly.        Other comments include: Insulin started during recent hospitalization.  Symptoms which suggest diabetic complications include ulcerations or sores.  Since last visit, the following infection(s) have been reported: foot.  The following treatment plan problems are reported: others.  The following changes have been made to her treatment plan since last visit: insulin dosing.  Treatment plan changes were initiated by MD.  Dietary compliance is reported to be poor.     Habits & Providers  Alcohol-Tobacco-Diet     Alcohol drinks/day: 0     Tobacco Status: current     Tobacco Counseling: to quit use of tobacco products     Cigarette Packs/Day: 1  Exercise-Depression-Behavior     Does Patient Exercise: no     Depression Counseling: further diagnostic testing and/or other treatment is indicated     Drug Use: no     Seat Belt Use: 100     Sun Exposure: occasionally  Current  Medications (verified): 1)  Metformin Hcl 1000 Mg Tabs (Metformin Hcl) .Marland Kitchen.. 1 Tablet By Mouth Two Times A Day For Blood Sugar 2)  Glipizide Xl 10 Mg Xr24h-Tab (Glipizide) .... One Tablet By Mouth Daily For Blood Sugar 3)  Prodigy Blood Glucose Monitor W/device Kit (Blood Glucose Monitoring Suppl) .... Dispense Glucometer To Check Blood Sugar 4)  Aspirin Ec Lo-Dose 81 Mg  Tbec (Aspirin) .Marland Kitchen.. 1 Tablet By Mouth Daily 5)  Prodigy Blood Glucose Test  Strp (Glucose Blood) .... Use To Check Blood Sugar Twice Daily  Dx 250.02 6)  Prodigy Twist Top Lancets 28g  Misc (Lancets) .... Use To Check Blood Sugar Twice Per Day  Dx 250.02 7)  Lac-Hydrin 12 % Lotn (Ammonium Lactate) .... One Application Topically To Affected Area Daily 8)  Gemfibrozil 600 Mg Tabs (Gemfibrozil) .... One Tablet By Mouth Two Times A Day 30 Minutes Before Breakfast and Dinner 9)  Lexapro 10 Mg Tabs (Escitalopram Oxalate) .... One Tablet By Mouth Daily 10)  Ciprofloxacin Hcl 500 Mg Tabs (Ciprofloxacin Hcl) .... One Tablet By Mouth Two Times A Day For Infection 11)  Lisinopril 5 Mg Tabs (Lisinopril) .... One Tablet By Mouth Daily 12)  Doxycycline Hyclate 100 Mg Caps (Doxycycline Hyclate) .... One Tablet By Mouth Two Times A Day 13)  Gabapentin 300 Mg Caps (Gabapentin) .... One Capsule By Mouth Three Times A Day 14)  Lantus 100 Unit/ml Soln (Insulin Glargine) .... 22 Units At Night Subcutaneously For Diabetes 15)  Novolog 100 Unit/ml Soln (Insulin Aspart) .Marland Kitchen.. 120 To 150: 2 Units 151 To 200: 4 Units 201 To 250: 6 Units 251 To 300: 8 Units 301 To 350: 10 Units 351 To 400: 12 Units > 400 13 Units 16)  Santyl 250 Unit/gm Oint (Collagenase) .... Use To Affected Area Daily During Dressing Change  Allergies (verified): 1)  ! Penicillin  Review of Systems CV:  Complains of swelling of feet; denies chest pain or discomfort. Resp:  Denies cough. GI:  Denies abdominal pain, nausea, and vomiting. Derm:  Complains of poor wound  healing.  Physical Exam  General:  alert.   Head:  normocephalic.   Lungs:  normal breath sounds.   Heart:  normal rate and regular rhythm.   Skin:  dressings not changed today as they were just seen by wound clinic Psych:  Oriented X3.     Impression & Recommendations:  Problem # 1:  DIABETIC FOOT ULCER, TOE (ICD-707.15) Pt to continue f/u at wound clinic as ordered she is to start dressing changes per the direction of that office daily pt to bring supplies into this  office on next visit so provider can assess wound  Problem # 2:  DIABETES MELLITUS, TYPE II (ICD-250.00) poor control advised pt that insulin will help control her blood sugar but she needs to take as ordered Her updated medication list for this problem includes:    Metformin Hcl 1000 Mg Tabs (Metformin hcl) .Marland Kitchen... 1 tablet by mouth two times a day for blood sugar    Glipizide Xl 10 Mg Xr24h-tab (Glipizide) ..... One tablet by mouth daily for blood sugar    Aspirin Ec Lo-dose 81 Mg Tbec (Aspirin) .Marland Kitchen... 1 tablet by mouth daily    Lisinopril 5 Mg Tabs (Lisinopril) ..... One tablet by mouth daily    Lantus 100 Unit/ml Soln (Insulin glargine) .Marland Kitchen... 22 units at night subcutaneously for diabetes    Novolog 100 Unit/ml Soln (Insulin aspart) .Marland Kitchen... 120 to 150: 2 units 151 to 200: 4 units 201 to 250: 6 units 251 to 300: 8 units 301 to 350: 10 units 351 to 400: 12 units > 400 13 units  Orders: Capillary Blood Glucose/CBG ([address]) UA Dipstick w/o Micro (manual) ([address])  Problem # 3:  TOBACCO ABUSE (ICD-305.1) aadvised cessation  Problem # 4:  PERIPHERAL NEUROPATHY (ICD-356.9) due to poorly controlled diabetes  Problem # 5:  DEPRESSION (ICD-311) will continue meds - pt is tolerating well and has seen some improvement Her updated medication list for this problem includes:    Lexapro 10 Mg Tabs (Escitalopram oxalate) ..... One tablet by mouth daily  Complete Medication List: 1)  Metformin Hcl 1000 Mg Tabs (Metformin hcl)  .Marland Kitchen.. 1 tablet by mouth two times a day for blood sugar 2)  Glipizide Xl 10 Mg Xr24h-tab (Glipizide) .... One tablet by mouth daily for blood sugar 3)  Prodigy Blood Glucose Monitor W/device Kit (Blood glucose monitoring suppl) .... Dispense glucometer to check blood sugar 4)  Aspirin Ec Lo-dose 81 Mg Tbec (Aspirin) .Marland Kitchen.. 1 tablet by mouth daily 5)  Prodigy Blood Glucose Test Strp (Glucose blood) .... Use to check blood sugar twice daily  dx 250.02 6)  Prodigy Twist Top Lancets 28g Misc (Lancets) .... Use to check blood sugar twice per day  dx 250.02 7)  Lac-hydrin 12 % Lotn (Ammonium lactate) .... One application topically to affected area daily 8)  Gemfibrozil 600 Mg Tabs (Gemfibrozil) .... One tablet by mouth two times a day 30 minutes before breakfast and dinner 9)  Lexapro 10 Mg Tabs (Escitalopram oxalate) .... One tablet by mouth daily 10)  Ciprofloxacin Hcl 500 Mg Tabs (Ciprofloxacin hcl) .... One tablet by mouth two times a day for infection 11)  Lisinopril 5 Mg Tabs (Lisinopril) .... One tablet by mouth daily 12)  Doxycycline Hyclate 100 Mg Caps (Doxycycline hyclate) .... One tablet by mouth two times a day 13)  Gabapentin 300 Mg Caps (Gabapentin) .... One capsule by mouth three times a day 14)  Lantus 100 Unit/ml Soln (Insulin glargine) .... 22 units at night subcutaneously for diabetes 15)  Novolog 100 Unit/ml Soln (Insulin aspart) .Marland Kitchen.. 120 to 150: 2 units 151 to 200: 4 units 201 to 250: 6 units 251 to 300: 8 units 301 to 350: 10 units 351 to 400: 12 units > 400 13 units 16)  Santyl 250 Unit/gm Oint (Collagenase) .... Use to affected area daily during dressing change  Diabetes Management Assessment/Plan:      The following lipid goals have been established for the patient: Total cholesterol goal of 200; LDL cholesterol goal of 100; HDL cholesterol goal of  40; Triglyceride goal of 150.  Her blood pressure goal is < 130/80.    Current Insulin Use:    Short Acting Insulin:       Type:   Novolin Regular       Dosage:  sliding scale units at breakfast; sliding scale units at lunch; sliding scale units at dinner;     Longer Acting Insulin:       Type:  Lantus       Dosage:  22 units at bedtime.  Patient Instructions: 1)  Keep checking your blood sugar before meals and dose the sliding scale. 2)  Continue the lantus 22 units at night 3)  Keep your appointment with wound clinic as ordered. 4)  Follow up in 2-3 weeks for diabetes. 5)  Bring items for dressing change with you into the office  Laboratory Results   Urine Tests  Date/Time Received: March 25, 2010 12:26 PM   Routine Urinalysis   Color: lt. yellow Glucose: negative   (Normal Range: Negative) Bilirubin: negative   (Normal Range: Negative) Ketone: negative   (Normal Range: Negative) Spec. Gravity: 1.015   (Normal Range: 1.003-1.035) Blood: negative   (Normal Range: Negative) pH: 7.5   (Normal Range: 5.0-8.0) Protein: negative   (Normal Range: Negative) Urobilinogen: 0.2   (Normal Range: 0-1) Nitrite: negative   (Normal Range: Negative) Leukocyte Esterace: negative   (Normal Range: Negative)     Blood Tests     CBG Random:: 136       X-ray  Procedure date:  03/23/2010  Findings:      left foot - no acute skeletal abnormalities. Large plantar calcaneal spur right foot - no acute skeletal abnormalities.  Plantar calcaneal spur which was small

## 2010-09-22 NOTE — Miscellaneous (Signed)
Summary: PA for Lovaza approved x1 year 08/30/10-08/30/11  Clinical Lists Changes  PA for Lovaza 1 gm. approved x1 year - 08/30/10 - 08/30/11.  Dutch Quint RN  August 31, 2010 11:27 AM   Medications: Changed medication from LOVAZA 1 GM CAPS (OMEGA-3-ACID ETHYL ESTERS) One capsule by mouth two times a day to LOVAZA 1 GM CAPS (OMEGA-3-ACID ETHYL ESTERS) One capsule by mouth two times a day

## 2010-09-22 NOTE — Medication Information (Signed)
Summary: PA/APPROVED Riesa Pope  PA/APPROVED Riesa Pope   Imported By: Arta Bruce 09/02/2010 11:48:42  _____________________________________________________________________  External Attachment:    Type:   Image     Comment:   External Document

## 2010-09-22 NOTE — Progress Notes (Signed)
Summary: meds refill  Phone Note Refill Request Call back at [phone]    need refill tramadol 50 mgrs [facility]   Initial call taken by: Domenic Polite,  August 17, 2010 4:53 PM  Follow-up for Phone Call        [facility] - advised pt check with pharamcy for availabilty Follow-up by: Lehman Prom FNP,  August 17, 2010 5:11 PM    Prescriptions: TRAMADOL HCL 50 MG TABS (TRAMADOL HCL) One tablet by mouth two times a day as needed for pain  #60 x 0   Entered and Authorized by:   Lehman Prom FNP   Signed by:   Lehman Prom FNP on 08/17/2010   Method used:   Electronically to        [facility] [address] [phone]* (retail)       [address]       [place], [place]  [address]       Ph: [phone] or [phone]       Fax: [phone]   RxID:   [id number]

## 2010-09-22 NOTE — Progress Notes (Signed)
Summary: Needs alternate for metformin  Phone Note Other Incoming Call back at [phone]   Caller: Tresa Endo from [facility] Summary of Call: Tresa Endo from [facility] says that Ms. Baynes stopped her metformin around last Tuesday since she developed very loose stools, to the point of incontinence.  She continues to take her other meds and her CBGs are well-controlled -- would like an alternate medication for the metformin.  She would like a call-back today. Initial call taken by: Dutch Quint RN,  July 18, 2010 2:09 PM  Follow-up for Phone Call        ok to stop metformin she is already on insulin both lantus and sliding scale increase lantus to 26 units nightly and dose sliding scale three times a day with meals according to scale as given on last visit Follow-up by: Lehman Prom FNP,  July 18, 2010 2:56 PM  Additional Follow-up for Phone Call Additional follow up Details #1::        Left message on answer machine for Tresa Endo to return call.  Left message on answer machine for pt. to return call on both home and work # Gaylyn Cheers RN  July 18, 2010 4:19 PM          Additional Follow-up for Phone Call Additional follow up Details #2::    Pt. returned call info given. Gaylyn Cheers RN  July 19, 2010 2:48 PM   New/Updated Medications: METFORMIN HCL 1000 MG TABS (METFORMIN HCL) HOLD LANTUS 100 UNIT/ML SOLN (INSULIN GLARGINE) 26 units at night subcutaneously for diabetes

## 2010-09-22 NOTE — Letter (Signed)
Summary: Generic Letter  [facility]  [address] [place], [place] [address]   Phone: [phone]  Fax: [phone]    02/02/2010  Marthenia Rolling [address] [place], [place]  [address]  Dear Ms. Sesler,  We have been unable to contact you by telephone.  Please call our office, at your earliest convenience, so that we may speak with you.   Sincerely,   Dutch Quint RN

## 2010-09-22 NOTE — Progress Notes (Signed)
Summary: DROOPPED OFF FORM FROM [employer]  Phone Note Call from Patient Call back at Home Phone [phone]   Summary of Call: MARTIN PT. MS Basques BROUGHT BY A FORM FROM [employer] FROM WORK FIRST TO BE FILLED OUT. HER CASE WORKER SAYS THAT IF POSSIBLE, CAN SHE GET IT BACK IN BY THIS THURSDAY.  MS Rabanal NEEDS TO COME AND PICK IT UP. Initial call taken by: Leodis Rains,  May 24, 2010 2:56 PM  Follow-up for Phone Call        form completed make copy for chart  contact pt to come pick up original Follow-up by: Lehman Prom FNP,  May 25, 2010 8:53 AM  Additional Follow-up for Phone Call Additional follow up Details #1::        TRY TO CALL PT/PHONE IS OUT OF ORDER//WILL TRY LATER Additional Follow-up by: Arta Bruce,  May 25, 2010 9:40 AM    Additional Follow-up for Phone Call Additional follow up Details #2::    PT PICKED UP FORM Follow-up by: Arta Bruce,  May 26, 2010 12:40 PM

## 2010-09-22 NOTE — Assessment & Plan Note (Signed)
Summary: Diabetes   Vital Signs:  Patient profile:   50 year old female Menstrual status:  regular LMP:     08/09/2010 Weight:      181.44 pounds Temp:     97.9 degrees F oral Pulse rate:   74 / minute Pulse rhythm:   regular Resp:     20 per minute BP sitting:   110 / 66  (left arm) Cuff size:   regular  Vitals Entered By: Hale Drone CMA (August 17, 2010 12:14 PM) CC: Here for a f/u on feet amputation.  CBG Result 132 CBG Device ID A Fasting LMP (date): 08/09/2010 LMP - Character: normal     Enter LMP: 08/09/2010 Last PAP Result  Specimen Adequacy: Satisfactory for evaluation.   Interpretation/Result:Negative for intraepithelial Lesion or Malignancy.      CC:  Here for a f/u on feet amputation. Marland Kitchen  History of Present Illness:  Pt into the office for f/u on diabetes. Pt has had even more amputations on the left foot - 3rd toe removed. Right foot - all toes removed Left foot - 1st and 3rd  toe removed  Diabetes Management History:      The patient is a 50 years old female who comes in for evaluation of Type 2 Diabetes Mellitus.  She has not been enrolled in the "Diabetic Education Program".  She states lack of understanding of dietary principles and is not following her diet appropriately.  Sensory loss is noted.  Self foot exams are being performed.  She is not checking home blood sugars.  She says that she is not exercising regularly.        Hypoglycemic symptoms are not occurring.  No hyperglycemic symptoms are reported.        Symptoms which suggest diabetic complications include ulcerations or sores.    Current Medications (verified): 1)  Metformin Hcl 1000 Mg Tabs (Metformin Hcl) .... Hold 2)  Glipizide Xl 10 Mg Xr24h-Tab (Glipizide) .... One Tablet By Mouth Daily For Blood Sugar 3)  Prodigy Blood Glucose Monitor W/device Kit (Blood Glucose Monitoring Suppl) .... Dispense Glucometer To Check Blood Sugar 4)  Aspirin Ec Lo-Dose 81 Mg  Tbec (Aspirin) .Marland Kitchen.. 1 Tablet  By Mouth Daily 5)  Prodigy Blood Glucose Test  Strp (Glucose Blood) .... Use To Check Blood Sugar Twice Daily  Dx 250.02 6)  Prodigy Twist Top Lancets 28g  Misc (Lancets) .... Use To Check Blood Sugar Twice Per Day  Dx 250.02 7)  Lovaza 1 Gm Caps (Omega-3-Acid Ethyl Esters) .... One Capsule By Mouth Two Times A Day 8)  Lexapro 10 Mg Tabs (Escitalopram Oxalate) .... One Tablet By Mouth Daily 9)  Lisinopril 5 Mg Tabs (Lisinopril) .... One Tablet By Mouth Daily 10)  Gabapentin 100 Mg Caps (Gabapentin) .... One Capsule By Mouth Three Times A Day 11)  Lantus 100 Unit/ml Soln (Insulin Glargine) .... 26 Units At Night Subcutaneously For Diabetes 12)  Novolog 100 Unit/ml Soln (Insulin Aspart) .... 201 To 250: 2 Units 251 To 300: 4 Units 301 To 350: 6 Units 351 To 400: 8 Units > 400 10units 13)  Insulin Syringe/needle 28g X 1/2" 0.5 Ml Misc (Insulin Syringe-Needle U-100) .... Use For Insulin Four Times Per Day Dx: 250.02 14)  Furosemide 20 Mg Tabs (Furosemide) .... One Tablet By Mouth Daily X 3 Days Then As Needed 15)  Tramadol Hcl 50 Mg Tabs (Tramadol Hcl) .... One Tablet By Mouth Two Times A Day As Needed For Pain 16)  Probiotic  Caps (Probiotic Product) .... One Capsule By Mouth Daily  Allergies (verified): 1)  ! Penicillin 2)  ! * Metformin  Review of Systems General:  Denies fever. CV:  Denies fatigue. Resp:  Denies shortness of breath. GI:  Denies abdominal pain, nausea, and vomiting. Derm:  Complains of poor wound healing; pt is s/p left 2nd toe amputation. Pt is now on doxycycline.  Physical Exam  General:  alert.   Head:  normocephalic.   Eyes:  glasses Neurologic:  walker - limping gait non-weight bearing on right foot Skin:  right foot - s/p total amputation incision is not approximated - yellow borders, wound bed with some granuation tissue but majority yellow left foot- right great toe amputation - well healed 3rd toe amputation - incision, scant drainage  Psych:  Oriented  X3.     Impression & Recommendations:  Problem # 1:  DIABETES MELLITUS, TYPE II (ICD-250.00) Increase lantus to 30units nightly reviewed with pt the need to keep blood sugars within normal limits Her updated medication list for this problem includes:    Metformin Hcl 1000 Mg Tabs (Metformin hcl) ..... Hold    Glipizide Xl 10 Mg Xr24h-tab (Glipizide) ..... One tablet by mouth daily for blood sugar    Aspirin Ec Lo-dose 81 Mg Tbec (Aspirin) .Marland Kitchen... 1 tablet by mouth daily    Lisinopril 5 Mg Tabs (Lisinopril) ..... One tablet by mouth daily    Lantus 100 Unit/ml Soln (Insulin glargine) .Marland KitchenMarland KitchenMarland KitchenMarland Kitchen 30  units at night subcutaneously for diabetes    Novolog 100 Unit/ml Soln (Insulin aspart) .Marland Kitchen... 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 muprocin applied to left wound.   telfa applied to both  wrapped with kerlex Orders: Dressing 4x4 UP (U0454)  Problem # 3:  HYPERCHOLESTEROLEMIA (ICD-272.0) will refill meds needs fasting lab visit Her updated medication list for this problem includes:    Lovaza 1 Gm Caps (Omega-3-acid ethyl esters) ..... One capsule by mouth two times a day  Complete Medication List: 1)  Metformin Hcl 1000 Mg Tabs (Metformin hcl) .... Hold 2)  Glipizide Xl 10 Mg Xr24h-tab (Glipizide) .... One tablet by mouth daily for blood sugar 3)  Aspirin Ec Lo-dose 81 Mg Tbec (Aspirin) .Marland Kitchen.. 1 tablet by mouth daily 4)  Lovaza 1 Gm Caps (Omega-3-acid ethyl esters) .... One capsule by mouth two times a day 5)  Lexapro 10 Mg Tabs (Escitalopram oxalate) .... One tablet by mouth daily 6)  Lisinopril 5 Mg Tabs (Lisinopril) .... One tablet by mouth daily 7)  Gabapentin 100 Mg Caps (Gabapentin) .... One capsule by mouth three  times a day 8)  Lantus 100 Unit/ml Soln (Insulin glargine) .... 30  units at night subcutaneously for diabetes 9)  Novolog 100 Unit/ml Soln (Insulin aspart) .... 201 to 250: 2 units 251 to 300: 4 units 301 to 350: 6 units 351 to 400: 8 units > 400 10units 10)  Insulin Syringe/needle 28g X 1/2" 0.5 Ml Misc (Insulin syringe-needle u-100) .... Use for insulin four times per day dx: 250.02 11)  Furosemide 20 Mg Tabs (Furosemide) .... One tablet by mouth daily x 3 days then as needed 12)  Tramadol Hcl 50 Mg Tabs (Tramadol  hcl) .... One tablet by mouth two times a day as needed for pain 13)  Probiotic Caps (Probiotic product) .... One capsule by mouth daily 14)  Accu-chek Aviva Kit (Blood glucose monitoring suppl) .... Use to check blood sugar four times per day 15)  Accu-chek Soft Touch Lancets Misc (Lancets) .... Use to check blood sugar four times per day 16)  Accu-chek Aviva Strp (Glucose blood) .... Use to check blood sugar four times per day  Diabetes Management Assessment/Plan:      The following lipid goals have been established for the patient: Total cholesterol goal of 200; LDL cholesterol goal of 100; HDL cholesterol goal of 40; Triglyceride goal of 150.  Her blood pressure goal is < 130/80.    Current Insulin Use:    Short Acting Insulin:       Type:  Novolin Regular       Dosage:  sliding scale units at breakfast; sliding scale units at lunch; sliding scale units at dinner;     Longer Acting Insulin:       Type:  Lantus       Dosage:  30 units at bedtime.  Patient Instructions: 1)  Diabetes - increase lantus to 30 units at night. 2)  Continue to use the sliding scale 3)  Keep taking glipizide 4)  Keep going to the wound clinic as scheduled. 5)  Follow up in this office in 2 months for diabetes 6)  need lipids, cbg, hbga1c Prescriptions: LOVAZA 1 GM CAPS (OMEGA-3-ACID ETHYL ESTERS) One capsule by mouth two times a day  #60 x 5   Entered and Authorized by:   Lehman Prom FNP    Signed by:   Lehman Prom FNP on 08/17/2010   Method used:   Print then Give to Patient   RxID:   [id number] ACCU-CHEK AVIVA  STRP (GLUCOSE BLOOD) Use to check blood sugar four times per day  #100 x 5   Entered and Authorized by:   Lehman Prom FNP   Signed by:   Lehman Prom FNP on 08/17/2010   Method used:   Print then Give to Patient   RxID:   [id number] ACCU-CHEK SOFT TOUCH LANCETS  MISC (LANCETS) Use to check blood sugar four times per day  #100 x 5   Entered and Authorized by:   Lehman Prom FNP   Signed by:   Lehman Prom FNP on 08/17/2010   Method used:   Print then Give to Patient   RxID:   [id number] ACCU-CHEK AVIVA  KIT (BLOOD GLUCOSE MONITORING SUPPL) Use to check blood sugar four times per day  #1 meter x 0   Entered and Authorized by:   Lehman Prom FNP   Signed by:   Lehman Prom FNP on 08/17/2010   Method used:   Print then Give to Patient   RxID:   [id number]   Diabetic Foot Exam Last Podiatry Exam Date: 06/21/2009 Foot Inspection Is there a history of a foot ulcer?              Yes Is there a foot ulcer now?              No Can the patient see the bottom of their feet?          Yes  Diabetic Foot Care Education  High Risk Feet? Yes   Orders Added: 1)  Est. Patient Level IV [06269] 2)  Capillary Blood Glucose/CBG [82948] 3)  Dressing 4x4 UP [A6402]  Not Administered:    Influenza Vaccine not given due to: declined   Prevention & Chronic Care Immunizations   Influenza vaccine: Fluvax 3+  (06/04/2009)    Tetanus booster: 08/22/2003: historical per pt    Pneumococcal vaccine: Pneumovax  (07/01/2009)  Other Screening   Pap smear:  Specimen Adequacy: Satisfactory for evaluation.   Interpretation/Result:Negative for intraepithelial Lesion or Malignancy.     (07/01/2009)   Pap smear action/deferral: Ordered  (07/01/2009)    Mammogram: ASSESSMENT: Negative - BI-RADS 1^MM DIGITAL SCREENING  (07/09/2009)    Mammogram action/deferral: Ordered  (07/01/2009)   Smoking status: current  (05/02/2010)   Smoking cessation counseling: yes  (01/30/2008)  Diabetes Mellitus   HgbA1C: 8.5  (07/07/2010)   HgbA1C action/deferral: Ordered  (07/01/2009)   Hemoglobin A1C due: 05/01/2008    Eye exam: diabetic retinopathy- will be reffed to Stephannie Li for possible treatment  (10/05/2009)    Foot exam: yes  (02/01/2010)   High risk foot: Yes  (08/17/2010)   Foot care education: Not documented   Foot exam due: 05/01/2008    Urine microalbumin/creatinine ratio: Not documented  Lipids   Total Cholesterol: 254  (07/01/2009)   LDL: See Comment mg/dL  (81/19/1478)   LDL Direct: Not documented   HDL: 33  (07/01/2009)   Triglycerides: 478  (07/01/2009)    SGOT (AST): 15  (07/01/2009)   SGPT (ALT): 18  (07/01/2009)   Alkaline phosphatase: 93  (07/01/2009)   Total bilirubin: 0.3  (07/01/2009)  Self-Management Support :    Diabetes self-management support: Not documented    Lipid self-management support: Not documented

## 2010-09-22 NOTE — Letter (Signed)
Summary: *Referral Letter  [facility]  [address] [place], [place] [address]   Phone: [phone]  Fax: [phone]    11/10/2009  Thank you in advance for agreeing to see my patient:  [name] [address] [place], [place]  [address]  Phone: [phone]  To: Heather Burundi I received correspondence that the above patient was seen and referred to Dr. Stephannie Li for possible retinopathy treatment.  There was also a questions regarding this patients' need for insulin.  [name] is VERY non-compliant with her medications which is why her diabetes is uncontrolled. She has a habit of not taking any of her medications for a long period of time due to reported side effects that are not expressed to the provider prior to discontinuing medications.  This is likely the reason she is having so many complications related to her diabetes.  Yes, she would do better on insulin however compliance will again be an issue.  Current Medical Problems: 1)  UNSPECIFIED BREAST SCREENING (ICD-V76.10) 2)  ROUTINE GYNECOLOGICAL EXAMINATION (ICD-V72.31) 3)  URINARY URGENCY (ICD-788.63) 4)  SINUSITIS (ICD-473.9) 5)  NEED PROPHYLACTIC VACCINATION&INOCULATION FLU (ICD-V04.81) 6)  TOBACCO ABUSE (ICD-305.1) 7)  CALLUS, RIGHT FOOT (ICD-700) 8)  CANDIDIASIS OF VULVA AND VAGINA (ICD-112.1) 9)  PERIPHERAL NEUROPATHY (ICD-356.9) 10)  ANA POSITIVE (ICD-790.99) 11)  DIABETES MELLITUS, TYPE II (ICD-250.00) Current Medications: 1)  METFORMIN HCL 1000 MG TABS (METFORMIN HCL) 1 tablet by mouth two times a day for blood sugar 2)  ACTOS 45 MG  TABS (PIOGLITAZONE HCL) 1 tablet by mouth daily for diabetes 3)  PRODIGY BLOOD GLUCOSE MONITOR W/DEVICE KIT (BLOOD GLUCOSE MONITORING SUPPL) Dispense glucometer to check blood sugar 4)  ASPIRIN EC LO-DOSE 81 MG  TBEC (ASPIRIN) 1 tablet by mouth daily 5)  PRODIGY BLOOD GLUCOSE TEST  STRP (GLUCOSE BLOOD) Use to check blood sugar twice daily  Dx  250.02 6)  PRODIGY TWIST TOP LANCETS 28G  MISC (LANCETS) Use to check blood sugar twice per day  Dx 250.02 7)  SANCTURA XR 60 MG XR24H-CAP (TROSPIUM CHLORIDE) One capsule by mouth daily 8)  TESSALON PERLES 100 MG CAPS (BENZONATATE) One capsule by mouth two times a day as needed for cough 9)  LAC-HYDRIN 12 % LOTN (AMMONIUM LACTATE) One application topically to affected area daily 10)  GEMFIBROZIL 600 MG TABS (GEMFIBROZIL) One tablet by mouth two times a day 30 minutes before breakfast and dinner        Thank you again for agreeing to see our patient; please contact us if you have any further questions or need additional information.  Sincerely,  Lehman Prom FNP

## 2010-09-22 NOTE — Progress Notes (Signed)
Summary: Question  Phone Note Call from Patient   Summary of Call: Pt needs more syringers ( and also speak with the provider directly because she has a question about the insulin and she is wondered if that can be possible before she takes her insulin again..   ([facility]) Please call her back at [phone] [facility] FNP Initial call taken by: Manon Hilding,  April 06, 2010 4:06 PM  Follow-up for Phone Call        Sent to N. Daphine Deutscher.  Dutch Quint RN  April 06, 2010 4:51 PM   Additional Follow-up for Phone Call Additional follow up Details #1::        Please call pt and see what questions she has.  [facility]  Additional Follow-up by: Lehman Prom FNP,  April 06, 2010 4:57 PM    Additional Follow-up for Phone Call Additional follow up Details #2::    Had several questions regarding sliding scale coverage, refrigeration of insulin, when not to take insulin.  Discussed and reviewed sliding scale, Lantus, Novolog, signs and symptoms of hypo and hyperglycemia.    Stated she accidently gave herself Novolog 22 units the other night instead of the Lantus -- got very shaky and confused.  Discussed body's response to insulin and how it takes time for it to adjust.  Says she is having visual difficulties when she takes her insulin - her eyes feel like they've been dilated.  Assured her I would sent this information to her provider, along with the FYI that she had to change her appointment to 8/30.  Sent to N. Daphine Deutscher. Follow-up by: Dutch Quint RN,  April 06, 2010 5:40 PM  Additional Follow-up for Phone Call Additional follow up Details #3:: Details for Additional Follow-up Action Taken: Novolog is a short acting insulin so she needs to be sure to put a rubber band around the pen itself so that she can distinguish between the lantus and the novolog.  giving herself 22 units of novolog most likely made her very hypoglycemic - shakes,  weakness, tiredness. Her vision has not been the best anyway but now that her blood sugar is getting under better control her eyes are likey trying to adjust.  When is her next eye appt?  If i'm not mistaken I think she should be due in Oct/Nov.   n.martin,fnp  April 07, 2010  8:09 AM  Syringes with the needles are not covered by Medicaid.  The one that the pharmacy had wasn't the right size for her to get the right insulin measurements.  Needs 1/2 cc needle, 30 g.  Please call in.  Also advised of provider's response re eyesight -- pt. will call regular eye doctor for an appt.  Dutch Quint RN  April 07, 2010 10:28 AM  syringes sent to the pharmacy with sizes as per pt n.martin,fnp April 07, 2010  10:32 AM    New/Updated Medications: INSULIN SYRINGE/NEEDLE 28G X 1/2" 1 ML MISC (INSULIN SYRINGE-NEEDLE U-100) Use for insulin four times per day INSULIN SYRINGE 30G X 5/16" 0.5 ML MISC (INSULIN SYRINGE-NEEDLE U-100) Use with insulin four times per day Prescriptions: INSULIN SYRINGE 30G X 5/16" 0.5 ML MISC (INSULIN SYRINGE-NEEDLE U-100) Use with insulin four times per day  #100 x 5   Entered and Authorized by:   Lehman Prom FNP   Signed by:   Lehman Prom FNP on 04/07/2010   Method used:   [facility] [address] [phone]* (retail)       [address]       [place], [place]  [address]       Ph: [phone] or [phone]       Fax: [phone]   RxID:   [id number] INSULIN SYRINGE/NEEDLE 28G X 1/2" 1 ML MISC (INSULIN SYRINGE-NEEDLE U-100) Use for insulin four times per day  #1 month qs x 5   Entered and Authorized by:   Lehman Prom FNP   Signed by:   Lehman Prom FNP on 04/06/2010   Method used:   [facility]        [facility] [address] [phone]* (retail)       [address]       [place], [place]  [address]       Ph: [phone] or [phone]       Fax: [phone]   RxID:    [id number]

## 2010-09-22 NOTE — Letter (Signed)
Summary: DIABETIC EYE EXAM REPORT  DIABETIC EYE EXAM REPORT   Imported By: Arta Bruce 01/20/2010 12:53:57  _____________________________________________________________________  External Attachment:    Type:   Image     Comment:   External Document

## 2010-09-22 NOTE — Progress Notes (Signed)
Summary: MEDICATION REFILLS  Phone Note Refill Request   Refills Requested: Medication #1:  LISINOPRIL 5 MG TABS One tablet by mouth daily  Medication #2:  LEXAPRO 10 MG TABS One tablet by mouth daily Initial call taken by: Levon Hedger,  April 22, 2010 5:07 PM Initial call taken by: Levon Hedger,  April 22, 2010 5:05 PM  Follow-up for Phone Call        rx faxed to the pharmacy Follow-up by: Lehman Prom FNP,  April 22, 2010 5:44 PM  Additional Follow-up for Phone Call Additional follow up Details #1::        pt informed. Additional Follow-up by: Levon Hedger,  April 26, 2010 10:49 AM    Prescriptions: GEMFIBROZIL 600 MG TABS (GEMFIBROZIL) One tablet by mouth two times a day 30 minutes before breakfast and dinner  #60 x 5   Entered and Authorized by:   Lehman Prom FNP   Signed by:   Lehman Prom FNP on 04/22/2010   Method used:   [facility]        [facility] [address] [phone]* (retail)       [address]       [place], [place]  [address]       Ph: [phone] or [phone]       Fax: [phone]   RxID:   [id number] LEXAPRO 10 MG TABS (ESCITALOPRAM OXALATE) One tablet by mouth daily  #30 x 5   Entered and Authorized by:   Lehman Prom FNP   Signed by:   Lehman Prom FNP on 04/22/2010   Method used:   [facility] to        [facility] [address] [phone]* (retail)       [address]       [place], [place]  [address]       Ph: [phone] or [phone]       Fax: [phone]   RxID:   [id number] LISINOPRIL 5 MG TABS (LISINOPRIL) One tablet by mouth daily  #30 x 5   Entered and Authorized by:   Lehman Prom FNP   Signed by:   Lehman Prom FNP on 04/22/2010   Method used:   [facility]ically to        [facility] [address] [phone]* (retail)       [address]       [place], [place]  [address]       Ph: [phone] or [phone]  Fax: [phone]   RxID:   [id number]

## 2010-09-22 NOTE — Progress Notes (Signed)
Summary: PA needed for Lovaza- PA approved  Phone Note Outgoing Call   Summary of Call: Needs PA for Lovaza -- was on Tricor from 02/25/08-06/2009, changed to gemfribrozil due to non-coverage.  Gemfibrozil 06/2009-04/22/10 changed to Lovaza -- no reason noted.  Do you want to continue with PA? Initial call taken by: Dutch Quint RN,  August 24, 2010 11:34 AM  Follow-up for Phone Call        yes, complete prior approval i added info to the bottom of the form, just complete the top Follow-up by: Lehman Prom FNP,  August 29, 2010 6:01 PM  Additional Follow-up for Phone Call Additional follow up Details #1::        Form completed - faxed to [facility] - waiting for response.  Dutch Quint RN  August 30, 2010 3:17 PM  PA approved - pharmacy and pt. notified.  Dutch Quint RN  August 31, 2010 11:25 AM

## 2010-09-30 ENCOUNTER — Telehealth (INDEPENDENT_AMBULATORY_CARE_PROVIDER_SITE_OTHER): Payer: Self-pay | Admitting: Nurse Practitioner

## 2010-10-05 ENCOUNTER — Telehealth (INDEPENDENT_AMBULATORY_CARE_PROVIDER_SITE_OTHER): Payer: Self-pay | Admitting: Nurse Practitioner

## 2010-10-12 NOTE — Progress Notes (Signed)
Summary: medication dose change  Phone Note Call from Patient   Summary of Call: pt called and said her [facility] had just come for a visit today and she asked [place] to call and see if there needs to be an increase in her Furosemide.  she is currently taking 20 mg of lasix. Pt states she is still having alot of swelling and indentation in the left leg. Initial call taken by: Levon Hedger,  October 05, 2010 11:16 AM  Follow-up for Phone Call        is she taking the lasix daily, it was orginally ordered for as needed? Has she needed it DAILY.  Find out from pt how often she is taking. Has she been elevating the stump? Sodium in her diet? Follow-up by: Lehman Prom FNP,  October 05, 2010 12:03 PM  Additional Follow-up for Phone Call Additional follow up Details #1::        Has been taking it daily, because her leg is swollen, calves and ankle.  Has been elevating leg, drinking diet coke "mega" daily.  Advised to read labels, watch for "hidden" sodium in bread and rolls, sodas, canned products and processed meats.  Discussed alternatives to soda.  Verbalized agreement and understanding.  Just refilled furosemide Rx, medicaid won't cover refill unless Rx is changed.  If Rx is changed, OK to leave information on voicemail.  Dutch Quint RN  October 05, 2010 12:24 PM     Additional Follow-up for Phone Call Additional follow up Details #2::    [facility]   Follow-up by: Lehman Prom FNP,  October 05, 2010 1:24 PM  Additional Follow-up for Phone Call Additional follow up Details #3:: Details for Additional Follow-up Action Taken: Pt. advised that dosage remains the same, but has been sent to pharmacy with directions changed to "daily."  To monitor sodium intake and elevate leg as much as possible.  Verbalized understanding and agreement. Additional Follow-up by: Dutch Quint RN,  October 05, 2010 2:12 PM  New/Updated  Medications: FUROSEMIDE 20 MG TABS (FUROSEMIDE) One tablet by mouth daily for fluid Prescriptions: FUROSEMIDE 20 MG TABS (FUROSEMIDE) One tablet by mouth daily for fluid  #30 x 3   Entered and Authorized by:   Lehman Prom FNP   Signed by:   Lehman Prom FNP on 10/05/2010   Method used:   [facility] to        [facility] [address] [phone]* (retail)       [address]       [place], [place]  [address]       Ph: [phone] or [phone]       Fax: [phone]   RxID:   [id number]

## 2010-10-12 NOTE — Progress Notes (Signed)
Summary: YEAST INFECTION  Phone Note Other Incoming   Caller: [facility] Summary of Call: KELLY IS AT [facility] AND WANTS TO REQUEST A RX FOR YEAST INFECTION, SHE HAS BEEN TAKING ANTIBIOTICS FOR HER WOUNDS AND DEVELOPED THIS AND HAS TRIED OVER THE [facility] AND NOTHING HAS HELPED. [facility]USES [facility]. Initial call taken by: Leodis Rains,  September 30, 2010 8:57 AM  Follow-up for Phone Call        Left message on answering machine for pt. to return call.  Tried to find out what her symptoms are.  Dutch Quint RN  September 30, 2010 4:33 PM   Additional Follow-up for Phone Call Additional follow up Details #1::        Get an update from pt when you contact her - has she been back into the office for more surgery since her last visit her will send fluconazole 150mg  by mouth by mouth x  dose to pharmacy Additional Follow-up by: Lehman Prom FNP,  September 30, 2010 4:47 PM    Additional Follow-up for Phone Call Additional follow up Details #2::    Already picked up Rx.  Has not been back for more surgery -- right now has wound vac to right foot, going back in two weeks to hopefully have synthetic skin attached.  Also needs refill of tramadol.  Dutch Quint RN  October 03, 2010 4:07 PM   Additional Follow-up for Phone Call Additional follow up Details #3:: Details for Additional Follow-up Action Taken: Tramadol refilled  Additional Follow-up by: Lehman Prom FNP,  October 03, 2010 4:30 PM  New/Updated Medications: FLUCONAZOLE 150 MG TABS (FLUCONAZOLE) One tablet by mouth x 1 dose Prescriptions: TRAMADOL HCL 50 MG TABS (TRAMADOL HCL) One tablet by mouth two times a day as needed for pain  #60 x 0   Entered and Authorized by:   Lehman Prom FNP   Signed by:   Lehman Prom FNP on 10/03/2010   Method used:   [facility]ically to        [facility] [address] [phone]* (retail)       [address]       [place], [place]  [address]       Ph: [phone] or [phone]       Fax: [phone]   RxID:   [id number] FLUCONAZOLE 150 MG TABS (FLUCONAZOLE) One tablet by mouth x 1 dose  #1 x 0   Entered and Authorized by:   Lehman Prom FNP   Signed by:   Lehman Prom FNP on 09/30/2010   Method used:   [facility]ronically to        [facility] [address] [phone]* (retail)       [address]       [place], [place]  [address]       Ph: [phone] or [phone]       Fax: [phone]   RxID:   [id number]

## 2010-10-18 ENCOUNTER — Encounter (INDEPENDENT_AMBULATORY_CARE_PROVIDER_SITE_OTHER): Payer: Self-pay | Admitting: Nurse Practitioner

## 2010-10-18 ENCOUNTER — Encounter: Payer: Self-pay | Admitting: Nurse Practitioner

## 2010-10-18 LAB — CONVERTED CEMR LAB: Blood Glucose, Fingerstick: 266

## 2010-10-27 NOTE — Assessment & Plan Note (Signed)
Summary: Diabetes   Vital Signs:  Patient profile:   50 year old female Menstrual status:  regular LMP:     08/2010 Temp:     98.0 degrees F oral Pulse rate:   66 / minute Pulse rhythm:   regular Resp:     20 per minute BP sitting:   129 / 71  (left arm) Cuff size:   regular  Vitals Entered By: Levon Hedger (October 18, 2010 11:00 AM) CC: 2 month follow-up DM...went to ortho and had skin graft done and has been having bleeding, Lipid Management Is Patient Diabetic? Yes Pain Assessment Patient in pain? yes     Location: leg, ankle Intensity: 5 CBG Result 266  Does patient need assistance? Ambulation Wheelchair LMP (date): 08/2010 LMP - Character: normal     Enter LMP: 08/2010 Last PAP Result  Specimen Adequacy: Satisfactory for evaluation.   Interpretation/Result:Negative for intraepithelial Lesion or Malignancy.      CC:  2 month follow-up DM...went to ortho and had skin graft done and has been having bleeding and Lipid Management.  History of Present Illness:  Pt into the office for follow up on diabetes She reports that she had skin graft to the left amputation on yesterday. She reports that the area is draining quite a bit and is soaking through her bandage She has a f/u appt with Dr. Lajoyce Corners on next week.  Pt did not bring her meds with her to this visit.  Advised pt that she is supposed to bring meds to each office visit  Social - pt is not employed at this time. She has some swelling in her lower extremities so she is on the cough most of the time with the legs elevated. She is not able to stand for extended periods of time becuase she is not able to balance herself with the amputations  Diabetes Management History:      The patient is a 50 years old female who comes in for evaluation of Type 2 Diabetes Mellitus.  She has not been enrolled in the "Diabetic Education Program".  She states lack of understanding of dietary principles and is not following her diet  appropriately.  Sensory loss is noted.  Self foot exams are not being performed.  She is not checking home blood sugars.  She says that she is not exercising regularly.        Hypoglycemic symptoms are not occurring.  No hyperglycemic symptoms are reported.        No changes have been made to her treatment plan since last visit.    Lipid Management History:      Positive NCEP/ATP III risk factors include diabetes, HDL cholesterol less than 40, and current tobacco user.  Negative NCEP/ATP III risk factors include female age less than 29 years old, no history of early menopause without estrogen hormone replacement, non-hypertensive, no ASHD (atherosclerotic heart disease), and no prior stroke/TIA.        Comments: pt is not fasting today for labs.    Allergies (verified): 1)  ! Penicillin 2)  ! * Metformin  Review of Systems General:  Denies fever. CV:  Complains of swelling of feet; denies chest pain or discomfort; R>L. Resp:  Denies cough. GI:  Denies abdominal pain, nausea, and vomiting. MS:  still with pain in both feet - phantom pain. Derm:  Complains of lesion(s) and poor wound healing; skin graft on yesterday.  Physical Exam  General:  alert.   Head:  normocephalic.  Eyes:  pupils reactive  glasses Lungs:  normal breath sounds.   Heart:  normal rate and regular rhythm.   Abdomen:  normal bowel sounds.   Msk:  presents in wheelchair Neurologic:  alert & oriented X3.   Skin:  color normal.   Psych:  Oriented X3.    Diabetes Management Exam:       Nails:          Left foot: amputation          Right foot: amputation   Impression & Recommendations:  Problem # 1:  DIABETES MELLITUS, TYPE II (ICD-250.00) Hgba1c = 8.0 today Her updated medication list for this problem includes:    Metformin Hcl 1000 Mg Tabs (Metformin hcl) ..... Hold    Glipizide Xl 10 Mg Xr24h-tab (Glipizide) ..... One tablet by mouth daily for blood sugar    Aspirin Ec Lo-dose 81 Mg Tbec (Aspirin)  .Marland Kitchen... 1 tablet by mouth daily    Lisinopril 5 Mg Tabs (Lisinopril) ..... One tablet by mouth daily    Lantus 100 Unit/ml Soln (Insulin glargine) .Marland Kitchen... 32  units at night subcutaneously for diabetes    Novolog 100 Unit/ml Soln (Insulin aspart) .Marland Kitchen... 201 to 250: 2 units 251 to 300: 4 units 301 to 350: 6 units 351 to 400: 8 units > 400 10units  Orders: Capillary Blood Glucose/CBG (82948) Hemoglobin A1C (83036)  Problem # 2:  HYPERCHOLESTEROLEMIA (ICD-272.0) unable to check labs today as pt is not fasting will check on next visit Her updated medication list for this problem includes:    Lovaza 1 Gm Caps (Omega-3-acid ethyl esters) ..... One capsule by mouth two times a day  Problem # 3:  TOBACCO ABUSE (ICD-305.1) advise cessation  Problem # 4:  LEG EDEMA (ICD-782.3)  Her updated medication list for this problem includes:    Furosemide 20 Mg Tabs (Furosemide) ..... One tablet by mouth daily for fluid  Problem # 5:  OSTEOMYELITIS (ICD-730.20) s/p amputation right foot is most recently s/p a skin graft - dressing reinforced today and pt was advised to call Dr. Lajoyce Corners to let his office know about the excessive drainage  Complete Medication List: 1)  Metformin Hcl 1000 Mg Tabs (Metformin hcl) .... Hold 2)  Glipizide Xl 10 Mg Xr24h-tab (Glipizide) .... One tablet by mouth daily for blood sugar 3)  Aspirin Ec Lo-dose 81 Mg Tbec (Aspirin) .Marland Kitchen.. 1 tablet by mouth daily 4)  Lovaza 1 Gm Caps (Omega-3-acid ethyl esters) .... One capsule by mouth two times a day 5)  Lexapro 10 Mg Tabs (Escitalopram oxalate) .... One tablet by mouth daily 6)  Lisinopril 5 Mg Tabs (Lisinopril) .... One tablet by mouth daily 7)  Gabapentin 100 Mg Caps (Gabapentin) .... One capsule by mouth three times a day 8)  Lantus 100 Unit/ml Soln (Insulin glargine) .... 32  units at night subcutaneously for diabetes 9)  Novolog 100 Unit/ml Soln (Insulin aspart) .... 201 to 250: 2 units 251 to 300: 4 units 301 to 350: 6 units 351 to  400: 8 units > 400 10units 10)  Insulin Syringe/needle 28g X 1/2" 0.5 Ml Misc (Insulin syringe-needle u-100) .... Use for insulin four times per day dx: 250.02 11)  Furosemide 20 Mg Tabs (Furosemide) .... One tablet by mouth daily for fluid 12)  Tramadol Hcl 50 Mg Tabs (Tramadol hcl) .... One tablet by mouth two times a day as needed for pain 13)  Probiotic Caps (Probiotic product) .... One capsule by mouth daily 14)  Accu-chek Aviva Kit (Blood glucose monitoring suppl) .... Use to check blood sugar four times per day 15)  Accu-chek Soft Touch Lancets Misc (Lancets) .... Use to check blood sugar four times per day 16)  Accu-chek Aviva Strp (Glucose blood) .... Use to check blood sugar four times per day 17)  Doxycycline Hyclate 100 Mg Caps (Doxycycline hyclate) .... Rx per dr. Lajoyce Corners  Diabetes Management Assessment/Plan:      The following lipid goals have been established for the patient: Total cholesterol goal of 200; LDL cholesterol goal of 100; HDL cholesterol goal of 40; Triglyceride goal of 150.  Her blood pressure goal is < 130/80.    Current Insulin Use:    Short Acting Insulin:       Type:  Novolin Regular       Dosage:  sliding scale units at breakfast; sliding scale units at lunch; sliding scale units at dinner;     Longer Acting Insulin:       Type:  Lantus       Dosage:  32 units at bedtime.  Lipid Assessment/Plan:      Based on NCEP/ATP III, the patient's risk factor category is "history of diabetes".  The patient's lipid goals are as follows: Total cholesterol goal is 200; LDL cholesterol goal is 100; HDL cholesterol goal is 40; Triglyceride goal is 150.    Patient Instructions: 1)  Diabetes - Hgba1c = 8.0 2)  Continue to take insulin as ordered 3)  Follow up in 2 months for diabetes.   Orders Added: 1)  Capillary Blood Glucose/CBG [82948] 2)  Est. Patient Level III [81191] 3)  Hemoglobin A1C [83036]    Laboratory Results   Blood Tests   Date/Time Received:  October 18, 2010 11:27 AM   HGBA1C: 8.0%   (Normal Range: Non-Diabetic - 3-6%   Control Diabetic - 6-8%) CBG Random:: 266

## 2010-10-31 LAB — CBC
MCH: 27.5 pg (ref 26.0–34.0)
MCHC: 33.1 g/dL (ref 30.0–36.0)
Platelets: 318 10*3/uL (ref 150–400)
RDW: 14.7 % (ref 11.5–15.5)

## 2010-10-31 LAB — COMPREHENSIVE METABOLIC PANEL
AST: 16 U/L (ref 0–37)
Albumin: 3.1 g/dL — ABNORMAL LOW (ref 3.5–5.2)
Calcium: 9 mg/dL (ref 8.4–10.5)
Creatinine, Ser: 0.68 mg/dL (ref 0.4–1.2)
GFR calc Af Amer: >60 mL/min (ref >60)
GFR calc non Af Amer: >60 mL/min (ref >60)

## 2010-10-31 LAB — GLUCOSE, CAPILLARY
Glucose-Capillary: 148 mg/dL — ABNORMAL HIGH (ref 70–99)
Glucose-Capillary: 166 mg/dL — ABNORMAL HIGH (ref 70–99)
Glucose-Capillary: 311 mg/dL — ABNORMAL HIGH (ref 70–99)
Glucose-Capillary: 96 mg/dL (ref 70–99)

## 2010-10-31 LAB — SURGICAL PCR SCREEN: Staphylococcus aureus: POSITIVE — AB

## 2010-10-31 LAB — APTT: aPTT: 30 seconds (ref 24–37)

## 2010-11-01 NOTE — Progress Notes (Signed)
Summary: Office Visit//HEALTH SCREEN  Office Visit//HEALTH SCREEN   Imported By: Arta Bruce 10/24/2010 11:10:12  _____________________________________________________________________  External Attachment:    Type:   Image     Comment:   External Document

## 2010-11-02 LAB — PROTIME-INR
INR: 1.82 — ABNORMAL HIGH (ref 0.00–1.49)
INR: 2.51 — ABNORMAL HIGH (ref 0.00–1.49)
Prothrombin Time: 17.5 seconds — ABNORMAL HIGH (ref 11.6–15.2)
Prothrombin Time: 27.2 seconds — ABNORMAL HIGH (ref 11.6–15.2)
Prothrombin Time: 34.5 seconds — ABNORMAL HIGH (ref 11.6–15.2)

## 2010-11-02 LAB — BASIC METABOLIC PANEL
BUN: 10 mg/dL (ref 6–23)
Chloride: 96 mEq/L (ref 96–112)
Creatinine, Ser: 0.76 mg/dL (ref 0.4–1.2)
GFR calc non Af Amer: >60 mL/min (ref >60)
Glucose, Bld: 97 mg/dL (ref 70–99)

## 2010-11-02 LAB — GLUCOSE, CAPILLARY
Glucose-Capillary: 115 mg/dL — ABNORMAL HIGH (ref 70–99)
Glucose-Capillary: 137 mg/dL — ABNORMAL HIGH (ref 70–99)
Glucose-Capillary: 147 mg/dL — ABNORMAL HIGH (ref 70–99)
Glucose-Capillary: 197 mg/dL — ABNORMAL HIGH (ref 70–99)
Glucose-Capillary: 256 mg/dL — ABNORMAL HIGH (ref 70–99)
Glucose-Capillary: 259 mg/dL — ABNORMAL HIGH (ref 70–99)
Glucose-Capillary: 87 mg/dL (ref 70–99)
Glucose-Capillary: 320 mg/dL — ABNORMAL HIGH (ref 70–99)
Glucose-Capillary: 362 mg/dL — ABNORMAL HIGH (ref 70–99)
Glucose-Capillary: 372 mg/dL — ABNORMAL HIGH (ref 70–99)

## 2010-11-02 LAB — CBC
HCT: 35.7 % — ABNORMAL LOW (ref 36.0–46.0)
MCHC: 32.2 g/dL (ref 30.0–36.0)
MCHC: 35 g/dL (ref 30.0–36.0)
MCV: 84.3 fL (ref 78.0–100.0)
Platelets: 410 10*3/uL — ABNORMAL HIGH (ref 150–400)
Platelets: 334 10*3/uL (ref 150–400)
RDW: 14.2 % (ref 11.5–15.5)
RDW: 13.5 % (ref 11.5–15.5)
WBC: 10.1 10*3/uL (ref 4.0–10.5)
WBC: 11.7 10*3/uL — ABNORMAL HIGH (ref 4.0–10.5)

## 2010-11-02 LAB — CULTURE, ROUTINE-ABSCESS: Gram Stain: NONE SEEN

## 2010-11-02 LAB — DIFFERENTIAL
Basophils Absolute: 0 10*3/uL (ref 0.0–0.1)
Basophils Relative: 0 % (ref 0–1)
Eosinophils Relative: 1 % (ref 0–5)
Lymphocytes Relative: 29 % (ref 12–46)

## 2010-11-02 LAB — COMPREHENSIVE METABOLIC PANEL
ALT: 21 U/L (ref 0–35)
AST: 25 U/L (ref 0–37)
Albumin: 2.6 g/dL — ABNORMAL LOW (ref 3.5–5.2)
Alkaline Phosphatase: 192 U/L — ABNORMAL HIGH (ref 39–117)
Calcium: 9.3 mg/dL (ref 8.4–10.5)
GFR calc Af Amer: >60 mL/min (ref >60)
Glucose, Bld: 344 mg/dL — ABNORMAL HIGH (ref 70–99)
Potassium: 4.1 mEq/L (ref 3.5–5.1)
Sodium: 129 mEq/L — ABNORMAL LOW (ref 135–145)
Total Protein: 7 g/dL (ref 6.0–8.3)

## 2010-11-02 LAB — SURGICAL PCR SCREEN
MRSA, PCR: NEGATIVE
Staphylococcus aureus: NEGATIVE

## 2010-11-03 ENCOUNTER — Encounter (INDEPENDENT_AMBULATORY_CARE_PROVIDER_SITE_OTHER): Payer: Self-pay | Admitting: Nurse Practitioner

## 2010-11-03 LAB — T4, FREE: Free T4: 1.11 ng/dL (ref 0.80–1.80)

## 2010-11-03 LAB — APTT: aPTT: 37 seconds (ref 24–37)

## 2010-11-03 LAB — DIFFERENTIAL
Basophils Absolute: 0 10*3/uL (ref 0.0–0.1)
Eosinophils Absolute: 0.2 10*3/uL (ref 0.0–0.7)
Eosinophils Absolute: 0 10*3/uL (ref 0.0–0.7)
Eosinophils Relative: 0 % (ref 0–5)
Lymphocytes Relative: 14 % (ref 12–46)
Lymphs Abs: 2.7 10*3/uL (ref 0.7–4.0)
Lymphs Abs: 2.2 10*3/uL (ref 0.7–4.0)
Monocytes Relative: 9 % (ref 3–12)
Neutrophils Relative %: 56 % (ref 43–77)

## 2010-11-03 LAB — POCT I-STAT, CHEM 8
BUN: 14 mg/dL (ref 6–23)
HCT: 41 % (ref 36.0–46.0)
Hemoglobin: 13.9 g/dL (ref 12.0–15.0)
Sodium: 135 mEq/L (ref 135–145)
TCO2: 26 mmol/L (ref 0–100)

## 2010-11-03 LAB — GLUCOSE, CAPILLARY
Glucose-Capillary: 338 mg/dL — ABNORMAL HIGH (ref 70–99)
Glucose-Capillary: 537 mg/dL — ABNORMAL HIGH (ref 70–99)
Glucose-Capillary: 125 mg/dL — ABNORMAL HIGH (ref 70–99)
Glucose-Capillary: 158 mg/dL — ABNORMAL HIGH (ref 70–99)
Glucose-Capillary: 159 mg/dL — ABNORMAL HIGH (ref 70–99)
Glucose-Capillary: 160 mg/dL — ABNORMAL HIGH (ref 70–99)
Glucose-Capillary: 190 mg/dL — ABNORMAL HIGH (ref 70–99)
Glucose-Capillary: 192 mg/dL — ABNORMAL HIGH (ref 70–99)
Glucose-Capillary: 193 mg/dL — ABNORMAL HIGH (ref 70–99)
Glucose-Capillary: 284 mg/dL — ABNORMAL HIGH (ref 70–99)

## 2010-11-03 LAB — URINALYSIS, ROUTINE W REFLEX MICROSCOPIC
Bilirubin Urine: NEGATIVE
Ketones, ur: NEGATIVE mg/dL
Leukocytes, UA: NEGATIVE
Nitrite: NEGATIVE
Specific Gravity, Urine: 1.034 — ABNORMAL HIGH (ref 1.005–1.030)
Urobilinogen, UA: 0.2 mg/dL (ref 0.0–1.0)
pH: 5.5 (ref 5.0–8.0)

## 2010-11-03 LAB — COMPREHENSIVE METABOLIC PANEL
BUN: 10 mg/dL (ref 6–23)
CO2: 30 mEq/L (ref 19–32)
Calcium: 8.9 mg/dL (ref 8.4–10.5)
Chloride: 101 mEq/L (ref 96–112)
Creatinine, Ser: 0.71 mg/dL (ref 0.4–1.2)
GFR calc non Af Amer: >60 mL/min (ref >60)
Glucose, Bld: 192 mg/dL — ABNORMAL HIGH (ref 70–99)
Total Bilirubin: 0.4 mg/dL (ref 0.3–1.2)

## 2010-11-03 LAB — CBC
HCT: 30 % — ABNORMAL LOW (ref 36.0–46.0)
HCT: 34.7 % — ABNORMAL LOW (ref 36.0–46.0)
Hemoglobin: 11.4 g/dL — ABNORMAL LOW (ref 12.0–15.0)
Hemoglobin: 12 g/dL (ref 12.0–15.0)
Hemoglobin: 9.8 g/dL — ABNORMAL LOW (ref 12.0–15.0)
MCH: 29.3 pg (ref 26.0–34.0)
MCH: 28.3 pg (ref 26.0–34.0)
MCH: 29 pg (ref 26.0–34.0)
MCH: 29.3 pg (ref 26.0–34.0)
MCHC: 33.8 g/dL (ref 30.0–36.0)
MCV: 84.8 fL (ref 78.0–100.0)
MCV: 85.8 fL (ref 78.0–100.0)
Platelets: 203 10*3/uL (ref 150–400)
Platelets: 231 10*3/uL (ref 150–400)
RBC: 4.38 MIL/uL (ref 3.87–5.11)
RBC: 3.57 MIL/uL — ABNORMAL LOW (ref 3.87–5.11)
RBC: 3.78 MIL/uL — ABNORMAL LOW (ref 3.87–5.11)
RBC: 3.93 MIL/uL (ref 3.87–5.11)
RBC: 4.09 MIL/uL (ref 3.87–5.11)
RDW: 14.6 % (ref 11.5–15.5)
WBC: 7.8 10*3/uL (ref 4.0–10.5)
WBC: 15.8 10*3/uL — ABNORMAL HIGH (ref 4.0–10.5)
WBC: 6.5 10*3/uL (ref 4.0–10.5)

## 2010-11-03 LAB — URINE MICROSCOPIC-ADD ON: Urine-Other: NONE SEEN

## 2010-11-03 LAB — PROTIME-INR
INR: 1.21 (ref 0.00–1.49)
Prothrombin Time: 15.5 seconds — ABNORMAL HIGH (ref 11.6–15.2)

## 2010-11-03 LAB — CULTURE, BLOOD (ROUTINE X 2)
Culture: NO GROWTH
Culture: NO GROWTH

## 2010-11-03 LAB — BASIC METABOLIC PANEL
CO2: 25 mEq/L (ref 19–32)
Chloride: 91 mEq/L — ABNORMAL LOW (ref 96–112)
GFR calc Af Amer: >60 mL/min (ref >60)
GFR calc Af Amer: >60 mL/min (ref >60)
GFR calc non Af Amer: >60 mL/min (ref >60)
Potassium: 3.9 mEq/L (ref 3.5–5.1)
Potassium: 4.8 mEq/L (ref 3.5–5.1)
Sodium: 124 mEq/L — ABNORMAL LOW (ref 135–145)
Sodium: 139 mEq/L (ref 135–145)

## 2010-11-03 LAB — WOUND CULTURE

## 2010-11-03 LAB — D-DIMER, QUANTITATIVE: D-Dimer, Quant: 0.89 ug/mL-FEU — ABNORMAL HIGH (ref 0.00–0.48)

## 2010-11-03 LAB — POCT CARDIAC MARKERS: Myoglobin, poc: 59 ng/mL (ref 12–200)

## 2010-11-03 LAB — C-REACTIVE PROTEIN: CRP: 16.7 mg/dL — ABNORMAL HIGH (ref <0.6)

## 2010-11-04 LAB — GLUCOSE, CAPILLARY
Glucose-Capillary: 112 mg/dL — ABNORMAL HIGH (ref 70–99)
Glucose-Capillary: 142 mg/dL — ABNORMAL HIGH (ref 70–99)
Glucose-Capillary: 152 mg/dL — ABNORMAL HIGH (ref 70–99)
Glucose-Capillary: 182 mg/dL — ABNORMAL HIGH (ref 70–99)
Glucose-Capillary: 193 mg/dL — ABNORMAL HIGH (ref 70–99)
Glucose-Capillary: 209 mg/dL — ABNORMAL HIGH (ref 70–99)
Glucose-Capillary: 220 mg/dL — ABNORMAL HIGH (ref 70–99)

## 2010-11-05 LAB — BASIC METABOLIC PANEL
BUN: 11 mg/dL (ref 6–23)
CO2: 27 mEq/L (ref 19–32)
Calcium: 8.8 mg/dL (ref 8.4–10.5)
Calcium: 8.8 mg/dL (ref 8.4–10.5)
Creatinine, Ser: 0.46 mg/dL (ref 0.4–1.2)
Creatinine, Ser: 0.51 mg/dL (ref 0.4–1.2)
Creatinine, Ser: 0.59 mg/dL (ref 0.4–1.2)
GFR calc Af Amer: >60 mL/min (ref >60)
GFR calc non Af Amer: >60 mL/min (ref >60)
GFR calc non Af Amer: >60 mL/min (ref >60)
Glucose, Bld: 166 mg/dL — ABNORMAL HIGH (ref 70–99)
Glucose, Bld: 235 mg/dL — ABNORMAL HIGH (ref 70–99)
Glucose, Bld: 362 mg/dL — ABNORMAL HIGH (ref 70–99)
Sodium: 138 mEq/L (ref 135–145)

## 2010-11-05 LAB — GLUCOSE, CAPILLARY
Glucose-Capillary: 116 mg/dL — ABNORMAL HIGH (ref 70–99)
Glucose-Capillary: 125 mg/dL — ABNORMAL HIGH (ref 70–99)
Glucose-Capillary: 129 mg/dL — ABNORMAL HIGH (ref 70–99)
Glucose-Capillary: 134 mg/dL — ABNORMAL HIGH (ref 70–99)
Glucose-Capillary: 161 mg/dL — ABNORMAL HIGH (ref 70–99)
Glucose-Capillary: 165 mg/dL — ABNORMAL HIGH (ref 70–99)
Glucose-Capillary: 166 mg/dL — ABNORMAL HIGH (ref 70–99)
Glucose-Capillary: 170 mg/dL — ABNORMAL HIGH (ref 70–99)
Glucose-Capillary: 198 mg/dL — ABNORMAL HIGH (ref 70–99)
Glucose-Capillary: 222 mg/dL — ABNORMAL HIGH (ref 70–99)
Glucose-Capillary: 227 mg/dL — ABNORMAL HIGH (ref 70–99)
Glucose-Capillary: 251 mg/dL — ABNORMAL HIGH (ref 70–99)
Glucose-Capillary: 391 mg/dL — ABNORMAL HIGH (ref 70–99)
Glucose-Capillary: 98 mg/dL (ref 70–99)

## 2010-11-05 LAB — DIFFERENTIAL
Basophils Absolute: 0 10*3/uL (ref 0.0–0.1)
Basophils Relative: 0 % (ref 0–1)
Eosinophils Absolute: 0 10*3/uL (ref 0.0–0.7)
Neutrophils Relative %: 63 % (ref 43–77)

## 2010-11-05 LAB — CBC
MCH: 30.9 pg (ref 26.0–34.0)
MCHC: 34.9 g/dL (ref 30.0–36.0)
Platelets: 202 10*3/uL (ref 150–400)
RBC: 4.07 MIL/uL (ref 3.87–5.11)
RDW: 13.3 % (ref 11.5–15.5)

## 2010-11-05 LAB — HEMOGLOBIN A1C: Mean Plasma Glucose: 292 mg/dL — ABNORMAL HIGH (ref <117)

## 2010-11-07 ENCOUNTER — Inpatient Hospital Stay (HOSPITAL_COMMUNITY)
Admission: AD | Admit: 2010-11-07 | Discharge: 2010-11-10 | DRG: 603 | Disposition: A | Discharge status: Skilled Nursing Facility | Payer: Medicaid Other | Source: Ambulatory Visit | Attending: Orthopedic Surgery | Admitting: Orthopedic Surgery

## 2010-11-07 DIAGNOSIS — L03119 Cellulitis of unspecified part of limb: Principal | ICD-10-CM | POA: Diagnosis present

## 2010-11-07 DIAGNOSIS — Z79899 Other long term (current) drug therapy: Secondary | ICD-10-CM

## 2010-11-07 DIAGNOSIS — Z794 Long term (current) use of insulin: Secondary | ICD-10-CM

## 2010-11-07 DIAGNOSIS — E119 Type 2 diabetes mellitus without complications: Secondary | ICD-10-CM | POA: Diagnosis present

## 2010-11-07 DIAGNOSIS — L02619 Cutaneous abscess of unspecified foot: Principal | ICD-10-CM | POA: Diagnosis present

## 2010-11-07 LAB — BASIC METABOLIC PANEL
BUN: 14 mg/dL (ref 6–23)
CO2: 29 mEq/L (ref 19–32)
Calcium: 8.9 mg/dL (ref 8.4–10.5)
GFR calc non Af Amer: >60 mL/min (ref >60)
Glucose, Bld: 123 mg/dL — ABNORMAL HIGH (ref 70–99)
Sodium: 136 mEq/L (ref 135–145)

## 2010-11-07 LAB — MRSA PCR SCREENING: MRSA by PCR: NEGATIVE

## 2010-11-07 LAB — GLUCOSE, CAPILLARY

## 2010-11-08 LAB — COMPREHENSIVE METABOLIC PANEL WITH GFR
ALT: 21 U/L (ref 0–35)
AST: 17 U/L (ref 0–37)
Albumin: 1.9 g/dL — ABNORMAL LOW (ref 3.5–5.2)
Alkaline Phosphatase: 172 U/L — ABNORMAL HIGH (ref 39–117)
BUN: 11 mg/dL (ref 6–23)
CO2: 29 meq/L (ref 19–32)
Calcium: 8.6 mg/dL (ref 8.4–10.5)
Chloride: 98 meq/L (ref 96–112)
Creatinine, Ser: 0.7 mg/dL (ref 0.4–1.2)
GFR calc non Af Amer: >60 mL/min
Glucose, Bld: 262 mg/dL — ABNORMAL HIGH (ref 70–99)
Potassium: 4.5 meq/L (ref 3.5–5.1)
Sodium: 135 meq/L (ref 135–145)
Total Bilirubin: 0.2 mg/dL — ABNORMAL LOW (ref 0.3–1.2)
Total Protein: 6.8 g/dL (ref 6.0–8.3)

## 2010-11-08 LAB — GLUCOSE, CAPILLARY
Glucose-Capillary: 355 mg/dL — ABNORMAL HIGH (ref 70–99)
Glucose-Capillary: 239 mg/dL — ABNORMAL HIGH (ref 70–99)

## 2010-11-08 LAB — PROTIME-INR
INR: 1.17 (ref 0.00–1.49)
Prothrombin Time: 15.1 s (ref 11.6–15.2)

## 2010-11-08 LAB — APTT

## 2010-11-08 LAB — CBC
Hemoglobin: 9 g/dL — ABNORMAL LOW (ref 12.0–15.0)
MCV: 81.9 fL (ref 78.0–100.0)
Platelets: 369 10*3/uL (ref 150–400)
RBC: 3.37 MIL/uL — ABNORMAL LOW (ref 3.87–5.11)
WBC: 6.6 10*3/uL (ref 4.0–10.5)

## 2010-11-08 LAB — HEMOGLOBIN A1C
Hgb A1c MFr Bld: 9.3 % — ABNORMAL HIGH
Mean Plasma Glucose: 220 mg/dL — ABNORMAL HIGH

## 2010-11-08 NOTE — Letter (Signed)
Summary: FAXED [facility]  FAXED [facility]   Imported By: Arta Bruce 11/03/2010 15:25:41  _____________________________________________________________________  External Attachment:    Type:   Image     Comment:   External Document

## 2010-11-09 LAB — CBC
Hemoglobin: 15.1 g/dL — ABNORMAL HIGH (ref 12.0–15.0)
MCHC: 35.3 g/dL (ref 30.0–36.0)
RDW: 13.1 % (ref 11.5–15.5)

## 2010-11-09 LAB — GLUCOSE, CAPILLARY
Glucose-Capillary: 266 mg/dL — ABNORMAL HIGH (ref 70–99)
Glucose-Capillary: 164 mg/dL — ABNORMAL HIGH (ref 70–99)
Glucose-Capillary: 197 mg/dL — ABNORMAL HIGH (ref 70–99)

## 2010-11-09 LAB — POCT I-STAT, CHEM 8
Calcium, Ion: 1.17 mmol/L (ref 1.12–1.32)
Chloride: 103 mEq/L (ref 96–112)
HCT: 44 % (ref 36.0–46.0)
TCO2: 26 mmol/L (ref 0–100)

## 2010-11-09 LAB — DIFFERENTIAL
Basophils Absolute: 0 10*3/uL (ref 0.0–0.1)
Basophils Relative: 0 % (ref 0–1)
Monocytes Absolute: 0.5 10*3/uL (ref 0.1–1.0)
Neutro Abs: 4.4 10*3/uL (ref 1.7–7.7)

## 2010-11-09 LAB — URINALYSIS, ROUTINE W REFLEX MICROSCOPIC
Bilirubin Urine: NEGATIVE
Leukocytes, UA: NEGATIVE
Nitrite: NEGATIVE
Specific Gravity, Urine: 1.041 — ABNORMAL HIGH (ref 1.005–1.030)
Urobilinogen, UA: 0.2 mg/dL (ref 0.0–1.0)

## 2010-11-09 LAB — URINE MICROSCOPIC-ADD ON

## 2010-11-10 LAB — GLUCOSE, CAPILLARY

## 2010-11-27 LAB — GLUCOSE, CAPILLARY

## 2010-11-27 NOTE — Discharge Summary (Signed)
  NAMEKAILANY, [name]               ACCOUNT NO.:  [id number]  MEDICAL RECORD NO.:  [id number]           PATIENT TYPE:  I  LOCATION:  5009                         FACILITY:  [facility]  PHYSICIAN:  Nadara Mustard, MD     DATE OF BIRTH:  06-11-1961  DATE OF ADMISSION:  11/07/2010 DATE OF DISCHARGE:  11/10/2010                              DISCHARGE SUMMARY   FINAL DIAGNOSIS:  Abscess infection, bilateral feet.  DISCHARGED TO:  Skilled nursing in stable condition.  ANTIBIOTICS: 1. Doxycycline 100 mg p.o. b.i.d. 2. Cipro 500 mg p.o. b.i.d.  WOUND CARE:  Dial soap cleansing of both feet daily with application of Silvadene plus dry dressing change, Vicodin for pain.  The patient is to continue her admission medications as per her medical reconciliation form.  Follow up with Dr. Lajoyce Corners in 1 week.  The patient is to keep her legs elevated with her feet level with her heart.  She is to only be weightbearing for transfer training, no gait training.  The patient is essentially to be nonweightbearing on both feet until the wounds and infections have healed.  Weightbearing for transfer training only. Follow up with Dr. Lajoyce Corners in 1 week.  Discharge to skilled nursing in stable condition.     Nadara Mustard, MD     MVD/MEDQ  D:  11/10/2010  T:  11/10/2010  Job:  [id number]  Electronically Signed by Aldean Baker MD on 11/27/2010 08:09:06 PM

## 2010-12-05 LAB — URINALYSIS, ROUTINE W REFLEX MICROSCOPIC
Bilirubin Urine: NEGATIVE
Glucose, UA: >1000 mg/dL — AB
Hgb urine dipstick: NEGATIVE
Ketones, ur: NEGATIVE mg/dL
Protein, ur: NEGATIVE mg/dL

## 2010-12-05 LAB — POCT I-STAT, CHEM 8
BUN: 15 mg/dL (ref 6–23)
Calcium, Ion: 1.11 mmol/L — ABNORMAL LOW (ref 1.12–1.32)
Creatinine, Ser: 0.6 mg/dL (ref 0.4–1.2)
Glucose, Bld: 301 mg/dL — ABNORMAL HIGH (ref 70–99)
TCO2: 25 mmol/L (ref 0–100)

## 2010-12-05 LAB — DIFFERENTIAL
Basophils Absolute: 0 10*3/uL (ref 0.0–0.1)
Eosinophils Relative: 1 % (ref 0–5)
Lymphocytes Relative: 9 % — ABNORMAL LOW (ref 12–46)
Lymphs Abs: 1.5 10*3/uL (ref 0.7–4.0)
Monocytes Absolute: 1.3 10*3/uL — ABNORMAL HIGH (ref 0.1–1.0)
Monocytes Relative: 8 % (ref 3–12)

## 2010-12-05 LAB — URINE MICROSCOPIC-ADD ON

## 2010-12-05 LAB — CBC
HCT: 48.9 % — ABNORMAL HIGH (ref 36.0–46.0)
Hemoglobin: 16.3 g/dL — ABNORMAL HIGH (ref 12.0–15.0)
RDW: 13.9 % (ref 11.5–15.5)

## 2010-12-13 NOTE — H&P (Signed)
  [name], [name]               ACCOUNT NO.:  [id number]  MEDICAL RECORD NO.:  [id number]           PATIENT TYPE:  I  LOCATION:  5009                         FACILITY:  [facility]  PHYSICIAN:  Nadara Mustard, MD     DATE OF BIRTH:  1960-11-14  DATE OF ADMISSION:  11/07/2010 DATE OF DISCHARGE:  11/10/2010                             HISTORY & PHYSICAL   HISTORY OF PRESENT ILLNESS:  The patient is a 50 year old woman who presents with an abscess/cellulitis of left foot.  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  She will be admitted and started on IV antibiotics.  PAST MEDICAL HISTORY:  Positive for type 2 diabetes.  PHYSICAL EXAMINATION:  The patient is alert and oriented.  No adenopathy.  She does have an antalgic gait.  She has palpable pulses. She has abscess, cellulitis, and swelling of the left foot.  She does not have protective sensation.  ASSESSMENT:  Diabetic insensate neuropathy with cellulitis and abscess of left foot.  PLAN:  The patient is admitted and started on IV vancomycin and Cipro. We will continue IV antibiotics and the patient may require surgical intervention if her infection does not resolve with the antibiotics. The patient does live at home alone.  She most likely will need discharge to short-term skilled nursing to continue her antibiotics and to continue nonweightbearing on her foot with appropriate medical care.     Nadara Mustard, MD     MVD/MEDQ  D:  11/27/2010  T:  11/28/2010  Job:  [id number]  Electronically Signed by Aldean Baker MD on 12/13/2010 02:55:04 PM

## 2011-01-03 NOTE — Procedures (Signed)
DUPLEX DEEP VENOUS EXAM - LOWER EXTREMITY   INDICATION:  Bilateral foot ulcers.   HISTORY:  Edema:  No  Trauma/Surgery:  Left great toe amputation, 04/14/2010  Pain:  No  PE:  No  Previous DVT:  No  Anticoagulants:  No  Other:   DUPLEX EXAM:                CFV   SFV   PopV  PTV    GSV                R  L  R  L  R  L  R   L  R  L  Thrombosis    o  o  o  o  o  o  o   o  o  o  Spontaneous   +  +  +  +  +  +  +   +  +  +  Phasic        +  +  +  +  +  +  +   +  +  +  Augmentation  +  +  +  +  +  +  +   +  +  +  Compressible  +  +  +  +  +  +  +   +  +  +  Competent     o  +  +  +  +  +  +   +  o  +   Legend:  + - yes  o - no  p - partial  D - decreased   IMPRESSION:  1. Bilateral lower extremities show no evidence of deep venous      thrombosis.  2. Right common femoral vein and greater saphenous vein show evidence      of venous insufficiency at the groin.    _____________________________  Leonides Sake, MD   EM/MEDQ  D:  04/22/2010  T:  04/22/2010  Job:  [id number]

## 2011-04-16 ENCOUNTER — Emergency Department (HOSPITAL_COMMUNITY)
Admission: EM | Admit: 2011-04-16 | Discharge: 2011-04-17 | Disposition: A | Discharge status: Home / Self Care | Payer: Medicaid Other | Attending: Internal Medicine | Admitting: Internal Medicine

## 2011-04-16 DIAGNOSIS — R059 Cough, unspecified: Secondary | ICD-10-CM | POA: Insufficient documentation

## 2011-04-16 DIAGNOSIS — R197 Diarrhea, unspecified: Secondary | ICD-10-CM | POA: Insufficient documentation

## 2011-04-16 DIAGNOSIS — N390 Urinary tract infection, site not specified: Secondary | ICD-10-CM | POA: Insufficient documentation

## 2011-04-16 DIAGNOSIS — E669 Obesity, unspecified: Secondary | ICD-10-CM | POA: Insufficient documentation

## 2011-04-16 DIAGNOSIS — R05 Cough: Secondary | ICD-10-CM | POA: Insufficient documentation

## 2011-04-16 DIAGNOSIS — E1169 Type 2 diabetes mellitus with other specified complication: Secondary | ICD-10-CM | POA: Insufficient documentation

## 2011-04-16 DIAGNOSIS — R112 Nausea with vomiting, unspecified: Secondary | ICD-10-CM | POA: Insufficient documentation

## 2011-04-16 LAB — POCT I-STAT, CHEM 8
BUN: 37 mg/dL — ABNORMAL HIGH (ref 6–23)
Creatinine, Ser: 1.5 mg/dL — ABNORMAL HIGH (ref 0.50–1.10)
Hemoglobin: 12.6 g/dL (ref 12.0–15.0)
Potassium: 4.3 mEq/L (ref 3.5–5.1)
Sodium: 129 mEq/L — ABNORMAL LOW (ref 135–145)

## 2011-04-16 LAB — CBC
MCV: 81.1 fL (ref 78.0–100.0)
Platelets: 217 10*3/uL (ref 150–400)
RBC: 4.17 MIL/uL (ref 3.87–5.11)
WBC: 10.8 10*3/uL — ABNORMAL HIGH (ref 4.0–10.5)

## 2011-04-16 LAB — DIFFERENTIAL
Basophils Absolute: 0 10*3/uL (ref 0.0–0.1)
Basophils Relative: 0 % (ref 0–1)
Lymphocytes Relative: 24 % (ref 12–46)
Monocytes Relative: 9 % (ref 3–12)
Neutro Abs: 7.2 10*3/uL (ref 1.7–7.7)
Neutrophils Relative %: 67 % (ref 43–77)

## 2011-04-17 ENCOUNTER — Emergency Department (HOSPITAL_COMMUNITY): Payer: Medicaid Other

## 2011-04-17 LAB — URINALYSIS, ROUTINE W REFLEX MICROSCOPIC
Bilirubin Urine: NEGATIVE
Ketones, ur: NEGATIVE mg/dL
Nitrite: NEGATIVE
Protein, ur: 30 mg/dL — AB
Specific Gravity, Urine: 1.011 (ref 1.005–1.030)
Urobilinogen, UA: 0.2 mg/dL (ref 0.0–1.0)

## 2011-04-17 LAB — GLUCOSE, CAPILLARY: Glucose-Capillary: 306 mg/dL — ABNORMAL HIGH (ref 70–99)

## 2011-04-17 LAB — URINE MICROSCOPIC-ADD ON

## 2011-04-17 LAB — PREGNANCY, URINE: Preg Test, Ur: NEGATIVE

## 2011-05-02 ENCOUNTER — Emergency Department (HOSPITAL_COMMUNITY)
Admission: EM | Admit: 2011-05-02 | Discharge: 2011-05-03 | Disposition: A | Discharge status: Home / Self Care | Payer: Medicaid Other | Attending: Emergency Medicine | Admitting: Emergency Medicine

## 2011-05-02 DIAGNOSIS — R5383 Other fatigue: Secondary | ICD-10-CM | POA: Insufficient documentation

## 2011-05-02 DIAGNOSIS — R5381 Other malaise: Secondary | ICD-10-CM | POA: Insufficient documentation

## 2011-05-02 DIAGNOSIS — E119 Type 2 diabetes mellitus without complications: Secondary | ICD-10-CM | POA: Insufficient documentation

## 2011-05-02 DIAGNOSIS — R112 Nausea with vomiting, unspecified: Secondary | ICD-10-CM | POA: Insufficient documentation

## 2011-05-02 DIAGNOSIS — R6883 Chills (without fever): Secondary | ICD-10-CM | POA: Insufficient documentation

## 2011-05-02 DIAGNOSIS — R630 Anorexia: Secondary | ICD-10-CM | POA: Insufficient documentation

## 2011-05-02 LAB — CBC
HCT: 32.5 % — ABNORMAL LOW (ref 36.0–46.0)
Hemoglobin: 11.4 g/dL — ABNORMAL LOW (ref 12.0–15.0)
MCH: 28.2 pg (ref 26.0–34.0)
MCHC: 35.1 g/dL (ref 30.0–36.0)
MCV: 80.4 fL (ref 78.0–100.0)
RDW: 14.4 % (ref 11.5–15.5)

## 2011-05-02 LAB — DIFFERENTIAL
Basophils Absolute: 0 10*3/uL (ref 0.0–0.1)
Basophils Relative: 0 % (ref 0–1)
Eosinophils Absolute: 0.1 10*3/uL (ref 0.0–0.7)
Neutro Abs: 5 10*3/uL (ref 1.7–7.7)
Neutrophils Relative %: 54 % (ref 43–77)

## 2011-05-02 LAB — COMPREHENSIVE METABOLIC PANEL
Albumin: 2.6 g/dL — ABNORMAL LOW (ref 3.5–5.2)
Alkaline Phosphatase: 94 U/L (ref 39–117)
BUN: 20 mg/dL (ref 6–23)
Creatinine, Ser: 0.75 mg/dL (ref 0.50–1.10)
GFR calc Af Amer: >60 mL/min (ref >60)
Glucose, Bld: 291 mg/dL — ABNORMAL HIGH (ref 70–99)
Potassium: 5 mEq/L (ref 3.5–5.1)
Total Protein: 8.5 g/dL — ABNORMAL HIGH (ref 6.0–8.3)

## 2011-05-02 LAB — URINALYSIS, ROUTINE W REFLEX MICROSCOPIC
Ketones, ur: 15 mg/dL — AB
Specific Gravity, Urine: 1.029 (ref 1.005–1.030)
pH: 5 (ref 5.0–8.0)

## 2011-05-02 LAB — URINE MICROSCOPIC-ADD ON

## 2011-05-02 LAB — CK TOTAL AND CKMB (NOT AT ARMC)
Relative Index: 1.7 (ref 0.0–2.5)
Total CK: 131 U/L (ref 7–177)

## 2011-05-02 LAB — LIPASE, BLOOD: Lipase: 123 U/L — ABNORMAL HIGH (ref 11–59)

## 2011-05-02 LAB — TROPONIN I: Troponin I: <0.3 ng/mL (ref <0.30)

## 2011-05-03 ENCOUNTER — Inpatient Hospital Stay (HOSPITAL_COMMUNITY): Payer: Medicaid Other

## 2011-05-03 ENCOUNTER — Inpatient Hospital Stay (HOSPITAL_COMMUNITY)
Admission: RE | Admit: 2011-05-03 | Discharge: 2011-05-09 | DRG: 617 | Disposition: A | Discharge status: Skilled Nursing Facility | Payer: Medicaid Other | Source: Ambulatory Visit | Attending: Internal Medicine | Admitting: Internal Medicine

## 2011-05-03 DIAGNOSIS — R29810 Facial weakness: Secondary | ICD-10-CM | POA: Diagnosis present

## 2011-05-03 DIAGNOSIS — Z7982 Long term (current) use of aspirin: Secondary | ICD-10-CM

## 2011-05-03 DIAGNOSIS — Z8632 Personal history of gestational diabetes: Secondary | ICD-10-CM

## 2011-05-03 DIAGNOSIS — M869 Osteomyelitis, unspecified: Secondary | ICD-10-CM | POA: Diagnosis present

## 2011-05-03 DIAGNOSIS — D638 Anemia in other chronic diseases classified elsewhere: Secondary | ICD-10-CM | POA: Diagnosis present

## 2011-05-03 DIAGNOSIS — IMO0002 Reserved for concepts with insufficient information to code with codable children: Principal | ICD-10-CM | POA: Diagnosis present

## 2011-05-03 DIAGNOSIS — Z8249 Family history of ischemic heart disease and other diseases of the circulatory system: Secondary | ICD-10-CM

## 2011-05-03 DIAGNOSIS — M9080 Osteopathy in diseases classified elsewhere, unspecified site: Secondary | ICD-10-CM | POA: Diagnosis present

## 2011-05-03 DIAGNOSIS — E1149 Type 2 diabetes mellitus with other diabetic neurological complication: Secondary | ICD-10-CM | POA: Diagnosis present

## 2011-05-03 DIAGNOSIS — E1142 Type 2 diabetes mellitus with diabetic polyneuropathy: Secondary | ICD-10-CM | POA: Diagnosis present

## 2011-05-03 DIAGNOSIS — I10 Essential (primary) hypertension: Secondary | ICD-10-CM | POA: Diagnosis present

## 2011-05-03 DIAGNOSIS — Z880 Allergy status to penicillin: Secondary | ICD-10-CM

## 2011-05-03 DIAGNOSIS — F411 Generalized anxiety disorder: Secondary | ICD-10-CM | POA: Diagnosis present

## 2011-05-03 DIAGNOSIS — Z91041 Radiographic dye allergy status: Secondary | ICD-10-CM

## 2011-05-03 DIAGNOSIS — Z794 Long term (current) use of insulin: Secondary | ICD-10-CM

## 2011-05-03 DIAGNOSIS — E1169 Type 2 diabetes mellitus with other specified complication: Principal | ICD-10-CM | POA: Diagnosis present

## 2011-05-03 DIAGNOSIS — R509 Fever, unspecified: Secondary | ICD-10-CM | POA: Diagnosis present

## 2011-05-03 DIAGNOSIS — Z833 Family history of diabetes mellitus: Secondary | ICD-10-CM

## 2011-05-03 LAB — COMPREHENSIVE METABOLIC PANEL
AST: 12 U/L (ref 0–37)
CO2: 32 mEq/L (ref 19–32)
Calcium: 9.3 mg/dL (ref 8.4–10.5)
Creatinine, Ser: 0.74 mg/dL (ref 0.50–1.10)
GFR calc non Af Amer: >60 mL/min (ref >60)
Total Protein: 7.4 g/dL (ref 6.0–8.3)

## 2011-05-03 LAB — CBC
MCH: 27.6 pg (ref 26.0–34.0)
Platelets: 258 10*3/uL (ref 150–400)
RBC: 3.59 MIL/uL — ABNORMAL LOW (ref 3.87–5.11)
WBC: 6.8 10*3/uL (ref 4.0–10.5)

## 2011-05-03 LAB — URINALYSIS, ROUTINE W REFLEX MICROSCOPIC
Bilirubin Urine: NEGATIVE
Glucose, UA: 500 mg/dL — AB
Ketones, ur: NEGATIVE mg/dL
Specific Gravity, Urine: 1.024 (ref 1.005–1.030)
pH: 5 (ref 5.0–8.0)

## 2011-05-03 LAB — GLUCOSE, CAPILLARY: Glucose-Capillary: 322 mg/dL — ABNORMAL HIGH (ref 70–99)

## 2011-05-03 LAB — CARDIAC PANEL(CRET KIN+CKTOT+MB+TROPI)
CK, MB: 2.5 ng/mL (ref 0.3–4.0)
Relative Index: 2.1 (ref 0.0–2.5)
Total CK: 121 U/L (ref 7–177)
Troponin I: <0.3 ng/mL (ref <0.30)

## 2011-05-03 LAB — PROTIME-INR
INR: 1.18 (ref 0.00–1.49)
Prothrombin Time: 15.3 seconds — ABNORMAL HIGH (ref 11.6–15.2)

## 2011-05-03 LAB — URINE MICROSCOPIC-ADD ON

## 2011-05-03 LAB — HEMOGLOBIN A1C: Mean Plasma Glucose: 318 mg/dL — ABNORMAL HIGH (ref <117)

## 2011-05-03 LAB — SURGICAL PCR SCREEN: MRSA, PCR: NEGATIVE

## 2011-05-04 ENCOUNTER — Inpatient Hospital Stay (HOSPITAL_COMMUNITY): Payer: Medicaid Other

## 2011-05-04 DIAGNOSIS — I6789 Other cerebrovascular disease: Secondary | ICD-10-CM

## 2011-05-04 LAB — CBC
HCT: 31.1 % — ABNORMAL LOW (ref 36.0–46.0)
MCH: 27.3 pg (ref 26.0–34.0)
MCHC: 33.4 g/dL (ref 30.0–36.0)
MCV: 81.5 fL (ref 78.0–100.0)
Platelets: 253 10*3/uL (ref 150–400)
Platelets: 243 10*3/uL (ref 150–400)
RDW: 14.6 % (ref 11.5–15.5)
RDW: 14.2 % (ref 11.5–15.5)
WBC: 4.8 10*3/uL (ref 4.0–10.5)
WBC: 5 10*3/uL (ref 4.0–10.5)

## 2011-05-04 LAB — CARDIAC PANEL(CRET KIN+CKTOT+MB+TROPI)
CK, MB: 2.2 ng/mL (ref 0.3–4.0)
Troponin I: <0.3 ng/mL (ref <0.30)
Troponin I: <0.3 ng/mL (ref <0.30)

## 2011-05-04 LAB — GLUCOSE, CAPILLARY: Glucose-Capillary: 180 mg/dL — ABNORMAL HIGH (ref 70–99)

## 2011-05-04 LAB — BASIC METABOLIC PANEL
Calcium: 9.2 mg/dL (ref 8.4–10.5)
Creatinine, Ser: 0.64 mg/dL (ref 0.50–1.10)
GFR calc Af Amer: >60 mL/min (ref >60)
GFR calc non Af Amer: >60 mL/min (ref >60)

## 2011-05-05 ENCOUNTER — Other Ambulatory Visit: Payer: Self-pay | Admitting: Orthopedic Surgery

## 2011-05-05 LAB — GLUCOSE, CAPILLARY: Glucose-Capillary: 205 mg/dL — ABNORMAL HIGH (ref 70–99)

## 2011-05-06 LAB — CBC
Hemoglobin: 9.3 g/dL — ABNORMAL LOW (ref 12.0–15.0)
MCH: 27 pg (ref 26.0–34.0)
MCV: 83.7 fL (ref 78.0–100.0)
RBC: 3.44 MIL/uL — ABNORMAL LOW (ref 3.87–5.11)
WBC: 7.4 10*3/uL (ref 4.0–10.5)

## 2011-05-06 LAB — GLUCOSE, CAPILLARY
Glucose-Capillary: 340 mg/dL — ABNORMAL HIGH (ref 70–99)
Glucose-Capillary: 347 mg/dL — ABNORMAL HIGH (ref 70–99)

## 2011-05-07 ENCOUNTER — Inpatient Hospital Stay (HOSPITAL_COMMUNITY): Payer: Medicaid Other

## 2011-05-07 LAB — GLUCOSE, CAPILLARY
Glucose-Capillary: 315 mg/dL — ABNORMAL HIGH (ref 70–99)
Glucose-Capillary: 477 mg/dL — ABNORMAL HIGH (ref 70–99)
Glucose-Capillary: 427 mg/dL — ABNORMAL HIGH (ref 70–99)
Glucose-Capillary: 426 mg/dL — ABNORMAL HIGH (ref 70–99)
Glucose-Capillary: 323 mg/dL — ABNORMAL HIGH (ref 70–99)
Glucose-Capillary: 262 mg/dL — ABNORMAL HIGH (ref 70–99)

## 2011-05-07 LAB — COMPREHENSIVE METABOLIC PANEL
Alkaline Phosphatase: 91 U/L (ref 39–117)
BUN: 23 mg/dL (ref 6–23)
CO2: 33 mEq/L — ABNORMAL HIGH (ref 19–32)
GFR calc Af Amer: 53 mL/min — ABNORMAL LOW (ref >60)
GFR calc non Af Amer: 44 mL/min — ABNORMAL LOW (ref >60)
Glucose, Bld: 321 mg/dL — ABNORMAL HIGH (ref 70–99)
Potassium: 4.1 mEq/L (ref 3.5–5.1)
Total Bilirubin: 0.2 mg/dL — ABNORMAL LOW (ref 0.3–1.2)
Total Protein: 7.1 g/dL (ref 6.0–8.3)

## 2011-05-07 LAB — BASIC METABOLIC PANEL
BUN: 25 mg/dL — ABNORMAL HIGH (ref 6–23)
CO2: 36 mEq/L — ABNORMAL HIGH (ref 19–32)
Calcium: 9.4 mg/dL (ref 8.4–10.5)
Creatinine, Ser: 1.4 mg/dL — ABNORMAL HIGH (ref 0.50–1.10)
GFR calc non Af Amer: 40 mL/min — ABNORMAL LOW (ref >60)
Glucose, Bld: 406 mg/dL — ABNORMAL HIGH (ref 70–99)

## 2011-05-07 LAB — URINALYSIS, DIPSTICK ONLY
Bilirubin Urine: NEGATIVE
Ketones, ur: NEGATIVE mg/dL
Nitrite: NEGATIVE
Protein, ur: NEGATIVE mg/dL
Specific Gravity, Urine: 1.014 (ref 1.005–1.030)
Urobilinogen, UA: 0.2 mg/dL (ref 0.0–1.0)

## 2011-05-08 LAB — GLUCOSE, CAPILLARY
Glucose-Capillary: 109 mg/dL — ABNORMAL HIGH (ref 70–99)
Glucose-Capillary: 135 mg/dL — ABNORMAL HIGH (ref 70–99)
Glucose-Capillary: 153 mg/dL — ABNORMAL HIGH (ref 70–99)
Glucose-Capillary: 155 mg/dL — ABNORMAL HIGH (ref 70–99)
Glucose-Capillary: 227 mg/dL — ABNORMAL HIGH (ref 70–99)
Glucose-Capillary: 244 mg/dL — ABNORMAL HIGH (ref 70–99)

## 2011-05-08 LAB — CBC
MCH: 27 pg (ref 26.0–34.0)
MCV: 81.3 fL (ref 78.0–100.0)
Platelets: 249 10*3/uL (ref 150–400)
RBC: 3.15 MIL/uL — ABNORMAL LOW (ref 3.87–5.11)
RDW: 15 % (ref 11.5–15.5)
WBC: 7 10*3/uL (ref 4.0–10.5)

## 2011-05-08 LAB — BASIC METABOLIC PANEL
BUN: 22 mg/dL (ref 6–23)
Calcium: 8.9 mg/dL (ref 8.4–10.5)
GFR calc Af Amer: >60 mL/min (ref >60)
GFR calc non Af Amer: 53 mL/min — ABNORMAL LOW (ref >60)
Glucose, Bld: 124 mg/dL — ABNORMAL HIGH (ref 70–99)

## 2011-05-08 LAB — PROTIME-INR: Prothrombin Time: 14.2 seconds (ref 11.6–15.2)

## 2011-05-09 LAB — CBC
HCT: 24.1 % — ABNORMAL LOW (ref 36.0–46.0)
Hemoglobin: 8.1 g/dL — ABNORMAL LOW (ref 12.0–15.0)
MCHC: 33.6 g/dL (ref 30.0–36.0)
RDW: 14.9 % (ref 11.5–15.5)
WBC: 5.5 10*3/uL (ref 4.0–10.5)

## 2011-05-09 LAB — GLUCOSE, CAPILLARY
Glucose-Capillary: 187 mg/dL — ABNORMAL HIGH (ref 70–99)
Glucose-Capillary: 256 mg/dL — ABNORMAL HIGH (ref 70–99)
Glucose-Capillary: 274 mg/dL — ABNORMAL HIGH (ref 70–99)

## 2011-05-10 LAB — CULTURE, BLOOD (SINGLE)
Culture  Setup Time: 201209130147
Culture: NO GROWTH

## 2011-05-11 NOTE — Op Note (Signed)
  NAMEVELINA, DROLLINGER NO.:  [id number]  MEDICAL RECORD NO.:  [id number]  LOCATION:  [facility]                         FACILITY:  [facility]  PHYSICIAN:  Nadara Mustard, MD     DATE OF BIRTH:  1961/03/28  DATE OF PROCEDURE:  05/04/2011 DATE OF DISCHARGE:                              OPERATIVE REPORT   PREOPERATIVE DIAGNOSIS:  Osteomyelitis, right foot and osteomyelitis, left second toe.  POSTOPERATIVE DIAGNOSIS:  Osteomyelitis, right foot and osteomyelitis, left second toe.  PROCEDURE: 1. Right transtibial amputation. 2. Left second toe amputation.  SURGEON:  Nadara Mustard. M.D.  ANESTHESIA:  General.  ESTIMATED BLOOD LOSS:  Minimal.  ANTIBIOTICS:  Vancomycin preoperatively.  DRAINS:  None.  COMPLICATIONS:  None.  TOURNIQUET TIME:  11 minutes at 350 mmHg to right thigh.  DISPOSITION:  To PACU in stable condition, also application of Acell Xenograft graft tissue graft to the transtibial amputation stump.  INDICATIONS FOR PROCEDURES:  The patient is a 50 year old woman who has uncontrolled diabetes, was admitted for glucose greater than 500, urinary tract infection as well as facial drooping.  The patient underwent a workup, ruled out stroke.  Her sugars were controlled.  She was started on IV antibiotics.  She has the osteomyelitis of both lower extremities.  She presents at this time for surgical intervention. Risks and benefits were discussed including infection, neurovascular injury, nonhealing wound, need for additional surgery.  The patient states she understands and wished to proceed at this time.  PROCEDURE:  The patient was brought to OR room 4 and underwent general anesthetic.  After adequate level of anesthesia obtained, both lower extremities were prepped using DuraPrep and draped into a sterile field. Attention was first focused on the right lower extremity, tourniquet was inflated, incision was made 11 cm distal tibial tubercle, this  curved proximally and large posterior flap was created.  The tibia was transected 1 cm proximal to skin incision and was beveled anteriorly. The fibula was transected just proximal tibial incision and a large posterior flap was created.  Sciatic nerve was pulled, cut and allowed to retract.  The vascular bundles were suture-ligated with zero silk. The tourniquet is deflated.  Hemostasis was obtained.  The Acell was placed onto the stump of the tibia and fibula.  This was sutured in place as well as the deep and superficial fascial layers were closed using #1 PDS.  The skin was closed using approximating staples Acell powder was applied to the wound edges and this was covered Adaptic orthopedic sponges, ABD dressing, Kerlix, and Coban.  Attention was then focused on the left lower extremity.  Amputation of the left second toe was performed through the MTP joint.  The wound was cleansed.  The skin was closed using 2-0 nylon.  The wound was covered Adaptic orthopedic sponges, Kerlix and Coban.  The patient was extubated and taken to PACU in stable condition     Nadara Mustard, MD  MVD/MEDQ  D:  05/04/2011  T:  05/05/2011  Job:  [id number]  Electronically Signed by Aldean Baker MD on 05/11/2011 13:08:65 AM

## 2011-05-12 LAB — CULTURE, BLOOD (ROUTINE X 2)
Culture  Setup Time: 201209152010
Culture: NO GROWTH
Culture: NO GROWTH

## 2011-05-18 LAB — POCT I-STAT, CHEM 8
BUN: 11
Calcium, Ion: 1.19
Chloride: 100
Glucose, Bld: 358 — ABNORMAL HIGH
TCO2: 27

## 2011-05-29 NOTE — Discharge Summary (Signed)
[name], [name]               ACCOUNT NO.:  [id number]  MEDICAL RECORD NO.:  [id number]  LOCATION:  2008                         FACILITY:  [facility]  PHYSICIAN:  Richarda Overlie, MD       DATE OF BIRTH:  09-Dec-1960  DATE OF ADMISSION:  05/03/2011 DATE OF DISCHARGE:  05/09/2011                              DISCHARGE SUMMARY   PRIMARY CARE PHYSICIAN:  Dorisann Frames, MD  PRIMARY SURGEON:  Nadara Mustard, MD  DISCHARGE DIAGNOSES: 1. Osteomyelitis, status post right transtibial amputation and left     second toe amputation. 2. Poorly controlled diabetes. 3. Diabetic neuropathy. 4. Hypertension. 5. Anxiety. 6. Gestational diabetes. 7. Bilateral tubal ligation. 8. History of prior right foot surgery with implantation of pins,     removal of ganglion cyst. 9. Carpal tunnel release of the right wrist. 10.Anemia of chronic disease.  Chest x-ray shows low volume of the chest with bilateral atelectasis. MRI of the head shows no acute infarct, mild positive nonspecific white matter changes.  MOTION-DEGRADED EXAMINATION:  Nonvisualization of the right PICA and both AICAs.  PERTINENT LABS:  Hemoglobin A1c of 12.7.  Blood cultures negative x2. Hemoglobin at the time of discharge WBC 5.5, hemoglobin 8.1, hematocrit 24.1, and platelet count of 278.  SUBJECTIVE:  This is a 50 year old female who was admitted to the Hospitalist Service for osteomyelitis of the left foot and the right leg and for CBG is greater than 500.  She also had a facial droop and was admitted to be ruled out for a CVA prior to going to the OR.  She was found to have osteomyelitis of both the lower extremities and surgical intervention by Dr. Aldean Baker was anticipated at the time of admission.  The patient had the following hospital course.  HOSPITAL COURSE: 1. Osteomyelitis.  The patient was started on broad-spectrum     antibiotics namely vancomycin and Zosyn.  She had a right     transtibial amputation and  left second toe amputation.  Following     surgery, she had a stable postoperative course except for severely     elevated blood sugars, for which, she had to be started on an     insulin drip.  The patient's wound is healing well and dressing     changes will be done as per recommendations of Dr. Lajoyce Corners.  She will     follow up with Dr. Lajoyce Corners in 3 weeks.  Per Dr. Lajoyce Corners, the patient's     infection has been completely removed and no IV antibiotics were     indicated.  The patient was transitioned to p.o. doxycycline, which     she will continue for another 10 days. 2. Diabetes.  The patient was found to be poorly controlled sugars     during this hospitalization, requiring an initiation of a glucose     stabilizer.  She was transitioned back to Lantus and sliding scale     insulin and a glipizide will be resumed.  She received diabetic     education during this hospital stay. 3. Anemia.  The patient was found to have a hemoglobin of 10.4 at the  time of admission.  Prior to discharge, she was 8.1, this is     probably secondary to hemodilution versus anemia of chronic     disease.  At this time, she is not having any obvious signs of     bleeding and she has not had any melena or black tarry stools     during this admission.  She would need to repeat CBC in about a     week.  DISCHARGE MEDICATIONS: 1. Tylenol 650 p.o. q.4 h. p.r.n. 2. Aspirin 81 p.o. daily. 3. Doxycycline 100 mg p.o. twice daily for 9 days. 4. Lovenox 40 mg subcu daily at bedtime, this can be discontinue when     the patient is ambulatory. 5. Glucerna 237 mL p.o. daily. 6. Lantus 55 units subcu daily at bedtime. 7. Cymbalta 30 mg p.o. in the morning. 8. Furosemide 20 mg p.o. in the morning. 9. Glipizide XL 10 mg p.o. every morning. 10.Hydrocodone 5/325 one to two tablets p.o. q.4 h. p.r.n. 11.Lovaza 1 gram one capsule p.o. twice daily. 12.Neurontin 800 mg one tablet p.o. three times a day. 13.NovoLog sliding scale  insulin three times a day before meals. 14.Oxybutynin 5 mg p.o. daily. 15.Silver sulfadiazine 1% application topical daily. 16.Synthroid 25 mcg p.o. daily. 17.Tramadol ER 50 mg p.o. q.6 h. p.r.n.     Richarda Overlie, MD     NA/MEDQ  D:  05/09/2011  T:  05/09/2011  Job:  [id number]  cc:   Nadara Mustard, MD Dorisann Frames, M.D.  Electronically Signed by Richarda Overlie MD on 05/29/2011 02:03:18 PM

## 2011-05-29 NOTE — H&P (Signed)
[name], [name]               ACCOUNT NO.:  [id number]  MEDICAL RECORD NO.:  [id number]  LOCATION:  2008                         FACILITY:  [facility]  PHYSICIAN:  Richarda Overlie, MD       DATE OF BIRTH:  Feb 05, 1961  DATE OF ADMISSION:  05/03/2011 DATE OF DISCHARGE:                             HISTORY & PHYSICAL   PRIMARY CARE PHYSICIAN:  [facility].  PRIMARY ENDOCRINOLOGIST:  Dorisann Frames, MD  SUBJECTIVE:  This is a 50 year old female, who was admitted from short stay because of a left facial droop.  The patient is a diabetic and has had uncontrolled sugars for the last several weeks.  She has osteomyelitis of her feet and was due to have below-knee amputation of her right leg today by Dr. Aldean Baker, but because of the new facial droop the surgery was cancelled.  The patient has had low-grade fever at home.  She has also had a productive cough for the last couple of days. She was in the ER yesterday where she was evaluated before her surgery and a workup revealed that her cardiac enzymes were negative, but her lipase was mildly elevated.  The patient states that she has also been feeling nauseous for the last several weeks and has only been able to keep liquids down.  It feels like an episode of viral gastroenteritis that she had a few weeks ago which has not completely resolved.  The patient also has had unmeasurable blood sugars at home.  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  PAST SURGICAL HISTORY:  Bilateral tubal ligation, history of prior right foot surgery with implantation of pins, removal of ganglion from the cyst, carpal tunnel release of the right wrist.  SOCIAL HISTORY:  The patient is single, use to smoke a pack a day, quit in April of this year.  She lives at home.  She has one child.  FAMILY  HISTORY:  Mother is alive in her 46s and healthy.  Father is alive in his 30s and healthy.  There is a family history of hypertension on the maternal side and family history of diabetes on the paternal side.  REVIEW OF SYSTEMS:  Complete review of systems was done as documented in HPI.  ALLERGIES:  PENICILLIN and CONTRAST MEDIA.  HOME MEDICATIONS:  Currently, the patient cannot recall her home medications.  PHYSICAL EXAMINATION:  VITAL SIGNS:  Temperature 101, blood pressure 142/78, respirations 20, pulse 81, 91% on room air. GENERAL:  Comfortable, currently in no acute cardiopulmonary distress. HEENT:  Pupils equal and reactive.  Extraocular movements intact. NECK:  Supple.  No JVD. LUNGS:  Clear to auscultation bilaterally.  No wheezes, crackles, or rhonchi. CARDIOVASCULAR:  Regular rate and rhythm.  No murmurs, rubs, or gallops. ABDOMEN:  Obese, soft, nontender, nondistended. EXTREMITIES:  The patient has amputation of her distal right foot and she has an ulcerated lesion on the left great toe.  No pedal edema. NEUROLOGIC:  Cranial nerves II through XII grossly intact except for the left facial droop.  Motor strength intact in bilateral upper and  lower extremities.  Finger-to-nose testing is intact.  Gait not tested. LYMPHATIC:  No axillary, inguinal, or cervical lymphadenopathy.  LABORATORY DATA:  Surgical PCR is negative.  Urinalysis shows many bacteria, but leukocyte esterase and nitrite are negative.  CK-MB is 2.2 and troponin is less than 0.3.  Lipase is 123.  CMP; sodium 133, potassium 5.0, chloride 94, bicarb 30, glucose 291, BUN 20, creatinine 0.75, total protein 0.3, alk phos 94, AST 15, ALT 14, calcium 9.6.  CBC; WBC 9.3, hemoglobin 11.4, hematocrit 32.5, and platelet count of 292.  IMAGING:  CT of the head without contrast is pending.  ASSESSMENT/PLAN: 1. Left facial droop, rule out transient ischemic     attack/cerebrovascular accident. 2. Uncontrolled  diabetes. 3. Fever likely secondary to osteomyelitis of the left foot as well as     cellulitis of the right leg. 4. Hypertension.  PLAN:  The patient will be admitted to telemetry.  We will do a full neurologic workup.  We will do a CT of the brain without contrast, an MRI and MRA, a 2-D echo, and a carotid Doppler.  The patient will be started on broad-spectrum antibiotics namely vancomycin and imipenem because of her fever and the possibility that she could have underlying osteomyelitis versus she could be bacteremic from longstanding infection.  A 2-D echo will also help to rule out any underlying vegetations.  Blood cultures x2 will be obtained.  We will start her on NovoLog sliding scale insulin and Lantus as well as glipizide.  We will reconcile her home medications.  She is a full code.     Richarda Overlie, MD     NA/MEDQ  D:  05/03/2011  T:  05/03/2011  Job:  [id number]  Electronically Signed by Richarda Overlie MD on 05/29/2011 02:03:30 PM

## 2011-06-07 LAB — BASIC METABOLIC PANEL
BUN: 12
CO2: 24
Calcium: 8.5
Chloride: 102
Creatinine, Ser: 0.83
GFR calc Af Amer: >60
Glucose, Bld: 511

## 2011-07-25 ENCOUNTER — Emergency Department (HOSPITAL_COMMUNITY): Payer: Medicaid Other

## 2011-07-25 ENCOUNTER — Encounter: Payer: Self-pay | Admitting: Emergency Medicine

## 2011-07-25 ENCOUNTER — Emergency Department (HOSPITAL_COMMUNITY)
Admission: EM | Admit: 2011-07-25 | Discharge: 2011-07-25 | Disposition: A | Discharge status: Home / Self Care | Payer: Medicaid Other | Attending: Emergency Medicine | Admitting: Emergency Medicine

## 2011-07-25 DIAGNOSIS — S0003XA Contusion of scalp, initial encounter: Secondary | ICD-10-CM | POA: Insufficient documentation

## 2011-07-25 DIAGNOSIS — M533 Sacrococcygeal disorders, not elsewhere classified: Secondary | ICD-10-CM | POA: Insufficient documentation

## 2011-07-25 DIAGNOSIS — R059 Cough, unspecified: Secondary | ICD-10-CM | POA: Insufficient documentation

## 2011-07-25 DIAGNOSIS — W19XXXA Unspecified fall, initial encounter: Secondary | ICD-10-CM

## 2011-07-25 DIAGNOSIS — R05 Cough: Secondary | ICD-10-CM | POA: Insufficient documentation

## 2011-07-25 DIAGNOSIS — S0083XA Contusion of other part of head, initial encounter: Secondary | ICD-10-CM | POA: Insufficient documentation

## 2011-07-25 DIAGNOSIS — I10 Essential (primary) hypertension: Secondary | ICD-10-CM | POA: Insufficient documentation

## 2011-07-25 DIAGNOSIS — S300XXA Contusion of lower back and pelvis, initial encounter: Secondary | ICD-10-CM | POA: Insufficient documentation

## 2011-07-25 DIAGNOSIS — W010XXA Fall on same level from slipping, tripping and stumbling without subsequent striking against object, initial encounter: Secondary | ICD-10-CM | POA: Insufficient documentation

## 2011-07-25 DIAGNOSIS — S88119A Complete traumatic amputation at level between knee and ankle, unspecified lower leg, initial encounter: Secondary | ICD-10-CM | POA: Insufficient documentation

## 2011-07-25 DIAGNOSIS — E119 Type 2 diabetes mellitus without complications: Secondary | ICD-10-CM | POA: Insufficient documentation

## 2011-07-25 DIAGNOSIS — R51 Headache: Secondary | ICD-10-CM | POA: Insufficient documentation

## 2011-07-25 HISTORY — DX: Essential (primary) hypertension: I10

## 2011-07-25 MED ORDER — HYDROMORPHONE HCL PF 2 MG/ML IJ SOLN
2.0000 mg | Freq: Once | INTRAMUSCULAR | Status: AC
  Administered 2011-07-25: 2 mg via INTRAMUSCULAR
  Filled 2011-07-25: qty 1

## 2011-07-25 MED ORDER — ONDANSETRON 4 MG PO TBDP
8.0000 mg | ORAL_TABLET | Freq: Once | ORAL | Status: AC
  Administered 2011-07-25: 8 mg via ORAL
  Filled 2011-07-25: qty 2

## 2011-07-25 NOTE — ED Provider Notes (Signed)
History     CSN: [id number] Arrival date & time: 07/25/2011  5:21 AM   First MD Initiated Contact with Patient 07/25/11 [phone]      Chief Complaint  Patient presents with  . Fall    (Consider location/radiation/quality/duration/timing/severity/associated sxs/prior treatment) Patient is a 50 y.o. female presenting with fall. The history is provided by the patient.  Fall The accident occurred 3 to 5 hours ago. The fall occurred while standing (She was in the bathroom and slipped). She fell from a height of 1 to 2 ft. She landed on a hard floor. There was no blood loss. The point of impact was the head (Sacrum). The pain is present in the head (Sacrum). The pain is at a severity of 9/10. The pain is moderate. She was ambulatory at the scene. There was no alcohol use involved in the accident. Pertinent negatives include no numbness, no abdominal pain, no nausea, no vomiting, no headaches, no loss of consciousness and no tingling. The symptoms are aggravated by activity, standing and pressure on the injury. Treatments tried: She tried taking some of her home Vicodin without improvement. The treatment provided no relief.    Past Medical History  Diagnosis Date  . Diabetes mellitus   . Hypertension     Past Surgical History  Procedure Date  . Amputation     No family history on file.  History  Substance Use Topics  . Smoking status: Former Games developer  . Smokeless tobacco: Not on file  . Alcohol Use: No    OB History    Grav Para Term Preterm Abortions TAB SAB Ect Mult Living                  Review of Systems  Respiratory: Positive for cough.        Chronic cough due to smoking  Gastrointestinal: Negative for nausea, vomiting and abdominal pain.  Neurological: Negative for tingling, loss of consciousness, numbness and headaches.  All other systems reviewed and are negative.    Allergies  Gadolinium; Metformin; and Penicillins  Home Medications  No current outpatient  prescriptions on file.  BP 150/73  Pulse 70  Temp(Src) 97.8 F (36.6 C) (Oral)  Resp 18  SpO2 96%  Physical Exam  Nursing note and vitals reviewed. Constitutional: She is oriented to person, place, and time. She appears well-developed and well-nourished. She appears distressed.  HENT:  Head: Normocephalic.    Eyes: EOM are normal. Pupils are equal, round, and reactive to light.  Neck: Normal range of motion. Neck supple.  Cardiovascular: Normal rate, regular rhythm, normal heart sounds and intact distal pulses.  Exam reveals no friction rub.   No murmur heard. Pulmonary/Chest: Effort normal and breath sounds normal. She has no wheezes. She has no rales.  Abdominal: Soft. Bowel sounds are normal. She exhibits no distension. There is no tenderness. There is no rebound and no guarding.  Musculoskeletal: Normal range of motion. She exhibits no tenderness.       Back:       No edema.  No pain in the left hip pain normal range of motion of the left hip. Right BKA.  Neurological: She is alert and oriented to person, place, and time. No cranial nerve deficit.  Skin: Skin is warm and dry. No rash noted.  Psychiatric: She has a normal mood and affect. Her behavior is normal.    ED Course  Procedures (including critical care time)  Labs Reviewed - No data to display Dg  Sacrum/coccyx  07/25/2011  *RADIOLOGY REPORT*  Clinical Data: Pain post fall  SACRUM AND COCCYX - 2+ VIEW  Comparison: None  Findings: Four views of the sacrum and coccyx submitted.  No acute fracture or subluxation.  Mild disc space flattening at L5-S1 level.  IMPRESSION: No acute fracture or subluxation.  Mild disc space flattening at L5 S1 level.  Original Report Authenticated By: Natasha Mead, M.D.   Ct Head Wo Contrast  07/25/2011  *RADIOLOGY REPORT*  Clinical Data: Fall, contusion  CT HEAD WITHOUT CONTRAST  Technique:  Contiguous axial images were obtained from the base of the skull through the vertex without contrast.   Comparison: 05/03/2011  Findings: No skull fracture is noted.  There is scalp swelling midline high convexity parietal region posteriorly. A small subgaleal hematoma in this region measures about 1.7 cm.  No intracranial hemorrhage, mass effect or midline shift.  No hydrocephalus.  No acute infarction.  No mass lesion is noted on this unenhanced scan.  IMPRESSION: No acute intracranial abnormality.  Scalp swelling and small subgaleal hematoma in the midline posterior parietal region high convexity.  Original Report Authenticated By: Natasha Mead, M.D.     No diagnosis found.    MDM   Patient with mechanical fall today in the bathroom. She walks with a walker due to having a right BKA. Patient states she hit her head and saw stars with contusion to the right side of her head. It also pain in her sacral area which she states is worse with movement, worse with palpation and worse with coughing. Denies any left hip pain and she has been able to stand on her left leg without any complaints of knee ankle or hip pain. Patient only takes aspirin but is on no other anticoagulation. She denies neck pain. Will get a CT of the head and sacral films. Patient's pain treated.  Films negative other than a scalp hematoma. Will DC home      Gwyneth Sprout, MD 07/25/11 [phone]

## 2011-07-25 NOTE — ED Notes (Signed)
Slipped at bathroom this morning , hit head against floor , no LOC , reports low back pain and headache - contusion at back of head .

## 2011-07-25 NOTE — ED Notes (Signed)
MD at bedside. 

## 2011-07-25 NOTE — ED Notes (Signed)
Pt transported to CT Scan, will assess pain when pt returns.

## 2011-09-27 ENCOUNTER — Emergency Department (HOSPITAL_COMMUNITY)
Admission: EM | Admit: 2011-09-27 | Discharge: 2011-09-28 | Disposition: A | Discharge status: Home / Self Care | Payer: Medicaid Other | Attending: Emergency Medicine | Admitting: Emergency Medicine

## 2011-09-27 ENCOUNTER — Emergency Department (HOSPITAL_COMMUNITY): Payer: Medicaid Other

## 2011-09-27 ENCOUNTER — Encounter (HOSPITAL_COMMUNITY): Payer: Self-pay | Admitting: *Deleted

## 2011-09-27 DIAGNOSIS — R05 Cough: Secondary | ICD-10-CM | POA: Insufficient documentation

## 2011-09-27 DIAGNOSIS — R739 Hyperglycemia, unspecified: Secondary | ICD-10-CM

## 2011-09-27 DIAGNOSIS — L02419 Cutaneous abscess of limb, unspecified: Secondary | ICD-10-CM | POA: Insufficient documentation

## 2011-09-27 DIAGNOSIS — R0602 Shortness of breath: Secondary | ICD-10-CM | POA: Insufficient documentation

## 2011-09-27 DIAGNOSIS — Z7982 Long term (current) use of aspirin: Secondary | ICD-10-CM | POA: Insufficient documentation

## 2011-09-27 DIAGNOSIS — R059 Cough, unspecified: Secondary | ICD-10-CM | POA: Insufficient documentation

## 2011-09-27 DIAGNOSIS — L03116 Cellulitis of left lower limb: Secondary | ICD-10-CM

## 2011-09-27 DIAGNOSIS — E119 Type 2 diabetes mellitus without complications: Secondary | ICD-10-CM | POA: Insufficient documentation

## 2011-09-27 DIAGNOSIS — R5381 Other malaise: Secondary | ICD-10-CM | POA: Insufficient documentation

## 2011-09-27 DIAGNOSIS — Z794 Long term (current) use of insulin: Secondary | ICD-10-CM | POA: Insufficient documentation

## 2011-09-27 DIAGNOSIS — R21 Rash and other nonspecific skin eruption: Secondary | ICD-10-CM | POA: Insufficient documentation

## 2011-09-27 DIAGNOSIS — Z79899 Other long term (current) drug therapy: Secondary | ICD-10-CM | POA: Insufficient documentation

## 2011-09-27 DIAGNOSIS — S88119A Complete traumatic amputation at level between knee and ankle, unspecified lower leg, initial encounter: Secondary | ICD-10-CM | POA: Insufficient documentation

## 2011-09-27 DIAGNOSIS — R5383 Other fatigue: Secondary | ICD-10-CM | POA: Insufficient documentation

## 2011-09-27 LAB — URINE MICROSCOPIC-ADD ON

## 2011-09-27 LAB — BASIC METABOLIC PANEL
BUN: 35 mg/dL — ABNORMAL HIGH (ref 6–23)
Calcium: 9.7 mg/dL (ref 8.4–10.5)
Creatinine, Ser: 1.61 mg/dL — ABNORMAL HIGH (ref 0.50–1.10)
GFR calc non Af Amer: 36 mL/min — ABNORMAL LOW (ref >90)
Glucose, Bld: 328 mg/dL — ABNORMAL HIGH (ref 70–99)

## 2011-09-27 LAB — URINALYSIS, ROUTINE W REFLEX MICROSCOPIC
Bilirubin Urine: NEGATIVE
Hgb urine dipstick: NEGATIVE
Ketones, ur: NEGATIVE mg/dL
Protein, ur: 30 mg/dL — AB
Specific Gravity, Urine: 1.013 (ref 1.005–1.030)
Urobilinogen, UA: 0.2 mg/dL (ref 0.0–1.0)

## 2011-09-27 LAB — CBC
Hemoglobin: 12.4 g/dL (ref 12.0–15.0)
MCH: 29.1 pg (ref 26.0–34.0)
MCHC: 35.1 g/dL (ref 30.0–36.0)
MCV: 82.9 fL (ref 78.0–100.0)
RBC: 4.26 MIL/uL (ref 3.87–5.11)

## 2011-09-27 LAB — DIFFERENTIAL
Basophils Relative: 0 % (ref 0–1)
Eosinophils Absolute: 0 10*3/uL (ref 0.0–0.7)
Eosinophils Relative: 0 % (ref 0–5)
Lymphs Abs: 3.1 10*3/uL (ref 0.7–4.0)
Monocytes Absolute: 1 10*3/uL (ref 0.1–1.0)
Monocytes Relative: 8 % (ref 3–12)

## 2011-09-27 MED ORDER — INSULIN ASPART 100 UNIT/ML ~~LOC~~ SOLN
10.0000 [IU] | Freq: Once | SUBCUTANEOUS | Status: AC
  Administered 2011-09-27: 10 [IU] via SUBCUTANEOUS
  Filled 2011-09-27: qty 1

## 2011-09-27 MED ORDER — INSULIN REGULAR HUMAN 100 UNIT/ML IJ SOLN: 10.0000 [IU] | Freq: Once | INTRAMUSCULAR | Status: DC

## 2011-09-27 MED ORDER — CLINDAMYCIN PHOSPHATE 600 MG/50ML IV SOLN
600.0000 mg | Freq: Once | INTRAVENOUS | Status: AC
  Administered 2011-09-28: 600 mg via INTRAVENOUS
  Filled 2011-09-27: qty 50

## 2011-09-27 MED ORDER — CLINDAMYCIN HCL 300 MG PO CAPS: 300.0000 mg | ORAL_CAPSULE | Freq: Four times a day (QID) | ORAL | Status: DC

## 2011-09-27 MED ORDER — SODIUM CHLORIDE 0.9 % IV BOLUS (SEPSIS)
1000.0000 mL | Freq: Once | INTRAVENOUS | Status: AC
  Administered 2011-09-27: 1000 mL via INTRAVENOUS

## 2011-09-27 NOTE — ED Provider Notes (Signed)
History     CSN: [id number]  Arrival date & time 09/27/11  2037   First MD Initiated Contact with Patient 09/27/11 2057      Chief Complaint  Patient presents with  . Hyperglycemia     HPI  History provided by the patient. Patient is a 51 year old female with history of hypertension and diabetes with right BKA who presents with complaints of elevated blood sugars for the past several days. Patient states that she is actually been having difficulty controlling her blood sugar for the past month or longer. She states sugars regularly above 400. Her blood sugar does improve when she uses her insulin but she states doesn't take long before its elevated again. Patient reports using a sliding scale insulin 3 times a day. She has been using 26 units at breakfast lunch and dinner. Patient also uses 58 units of Levemir at bedtime. Patient does report feeling some increased fatigue recently with slight cough and "flulike" symptoms. She denies fever or body aches. Patient also reports having an episode of "regurgitating" today with nausea. Patient denies having any dysuria, hematuria, urinary frequency. She denies any pain. No chest pain, shortness of breath, abdominal pain.    Past Medical History  Diagnosis Date  . Diabetes mellitus   . Hypertension     Past Surgical History  Procedure Date  . Amputation     No family history on file.  History  Substance Use Topics  . Smoking status: Former Games developer  . Smokeless tobacco: Not on file  . Alcohol Use: No    OB History    Grav Para Term Preterm Abortions TAB SAB Ect Mult Living                  Review of Systems  Constitutional: Positive for fatigue. Negative for fever, chills, diaphoresis and appetite change.  HENT: Negative for congestion, sore throat and rhinorrhea.   Respiratory: Positive for cough. Negative for shortness of breath.   Cardiovascular: Negative for chest pain and palpitations.  Gastrointestinal: Positive for  nausea. Negative for vomiting, abdominal pain, diarrhea and constipation.  Genitourinary: Negative for dysuria, frequency, hematuria, flank pain, vaginal bleeding and vaginal discharge.  Musculoskeletal: Negative for back pain and arthralgias.  Skin: Negative for rash.  All other systems reviewed and are negative.    Allergies  Gadolinium; Metformin; and Penicillins  Home Medications   Current Outpatient Rx  Name Route Sig Dispense Refill  . ASPIRIN EC 81 MG PO TBEC Oral Take 81 mg by mouth daily.    . DULOXETINE HCL 60 MG PO CPEP Oral Take 60 mg by mouth every morning.      . FUROSEMIDE 20 MG PO TABS Oral Take 20 mg by mouth every morning.      Marland Kitchen GABAPENTIN 800 MG PO TABS Oral Take 800 mg by mouth 3 (three) times daily.      Marland Kitchen GEMFIBROZIL 600 MG PO TABS Oral Take 600 mg by mouth 2 (two) times daily before a meal.    . HYDROCODONE-ACETAMINOPHEN 10-500 MG PO TABS Oral Take 1 tablet by mouth every 6 (six) hours as needed. For pain     . INSULIN ASPART 100 UNIT/ML [place] SOLN Subcutaneous Inject 15 Units into the skin 3 (three) times daily before meals. Inject 15 units + sliding scale under the skin at 6:30am, 11:30am, and at 4:30am.     . INSULIN DETEMIR 100 UNIT/ML [place] SOLN Subcutaneous Inject 58 Units into the skin at bedtime.     Marland Kitchen  LEVOTHYROXINE SODIUM 25 MCG PO TABS Oral Take 25 mcg by mouth every morning.      Marland Kitchen LISINOPRIL 5 MG PO TABS Oral Take 5 mg by mouth daily after breakfast.     . METOCLOPRAMIDE HCL 5 MG PO TABS Oral Take 5 mg by mouth 3 (three) times daily.      . OMEGA-3-ACID ETHYL ESTERS 1 G PO CAPS Oral Take 2 g by mouth 2 (two) times daily. Take 2 grams at 8am and 2 grams at 4pm.    . OXYBUTYNIN CHLORIDE 5 MG PO TABS Oral Take 5 mg by mouth every morning.      Marland Kitchen TRAMADOL HCL ER 100 MG PO TB24 Oral Take 100 mg by mouth every morning.        BP 107/61  Pulse 74  Temp(Src) 98.1 F (36.7 C) (Oral)  Resp 16  Ht 5\' 5"  (1.651 m)  Wt 200 lb (90.719 kg)  BMI 33.28 kg/m2  SpO2  98%  LMP 09/21/2011  Physical Exam  Nursing note and vitals reviewed. Constitutional: She is oriented to person, place, and time. She appears well-developed and well-nourished. No distress.  HENT:  Head: Normocephalic and atraumatic.  Neck: Normal range of motion. Neck supple.       No meningeal signs.  Cardiovascular: Normal rate and regular rhythm.   Pulmonary/Chest: Effort normal and breath sounds normal. No respiratory distress. She has no wheezes. She has no rales.       Coughing on exam.  Abdominal: Soft. She exhibits no distension. There is no tenderness. There is no rebound.  Musculoskeletal: She exhibits no edema and no tenderness.       BKA right lower leg. amputation of left first through third toes.  Erythema of skin over left anterior lower leg and shin.  Skin is warm to touch.  Neurological: She is alert and oriented to person, place, and time.  Skin: Skin is warm and dry. Rash noted. There is erythema.  Psychiatric: She has a normal mood and affect. Her behavior is normal.    ED Course  Procedures   Labs Reviewed  GLUCOSE, CAPILLARY - Abnormal; Notable for the following:    Glucose-Capillary 321 (*)    All other components within normal limits  CBC  DIFFERENTIAL  BASIC METABOLIC PANEL  URINALYSIS, ROUTINE W REFLEX MICROSCOPIC   Results for orders placed during the hospital encounter of 09/27/11  GLUCOSE, CAPILLARY      Component Value Range   Glucose-Capillary 321 (*) 70 - 99 (mg/dL)  CBC      Component Value Range   WBC 12.7 (*) 4.0 - 10.5 (K/uL)   RBC 4.26  3.87 - 5.11 (MIL/uL)   Hemoglobin 12.4  12.0 - 15.0 (g/dL)   HCT 16.1 (*) 09.6 - 46.0 (%)   MCV 82.9  78.0 - 100.0 (fL)   MCH 29.1  26.0 - 34.0 (pg)   MCHC 35.1  30.0 - 36.0 (g/dL)   RDW 04.5  40.9 - 81.1 (%)   Platelets 174  150 - 400 (K/uL)  DIFFERENTIAL      Component Value Range   Neutrophils Relative 68  43 - 77 (%)   Neutro Abs 8.6 (*) 1.7 - 7.7 (K/uL)   Lymphocytes Relative 24  12 - 46  (%)   Lymphs Abs 3.1  0.7 - 4.0 (K/uL)   Monocytes Relative 8  3 - 12 (%)   Monocytes Absolute 1.0  0.1 - 1.0 (K/uL)   Eosinophils Relative 0  0 - 5 (%)   Eosinophils Absolute 0.0  0.0 - 0.7 (K/uL)   Basophils Relative 0  0 - 1 (%)   Basophils Absolute 0.0  0.0 - 0.1 (K/uL)  BASIC METABOLIC PANEL      Component Value Range   Sodium 129 (*) 135 - 145 (mEq/L)   Potassium 4.2  3.5 - 5.1 (mEq/L)   Chloride 90 (*) 96 - 112 (mEq/L)   CO2 26  19 - 32 (mEq/L)   Glucose, Bld 328 (*) 70 - 99 (mg/dL)   BUN 35 (*) 6 - 23 (mg/dL)   Creatinine, Ser 2.13 (*) 0.50 - 1.10 (mg/dL)   Calcium 9.7  8.4 - 08.6 (mg/dL)   GFR calc non Af Amer 36 (*) >90 (mL/min)   GFR calc Af Amer 42 (*) >90 (mL/min)  URINALYSIS, ROUTINE W REFLEX MICROSCOPIC      Component Value Range   Color, Urine YELLOW  YELLOW    APPearance CLOUDY (*) CLEAR    Specific Gravity, Urine 1.013  1.005 - 1.030    pH 5.0  5.0 - 8.0    Glucose, UA 250 (*) NEGATIVE (mg/dL)   Hgb urine dipstick NEGATIVE  NEGATIVE    Bilirubin Urine NEGATIVE  NEGATIVE    Ketones, ur NEGATIVE  NEGATIVE (mg/dL)   Protein, ur 30 (*) NEGATIVE (mg/dL)   Urobilinogen, UA 0.2  0.0 - 1.0 (mg/dL)   Nitrite NEGATIVE  NEGATIVE    Leukocytes, UA NEGATIVE  NEGATIVE   GLUCOSE, CAPILLARY      Component Value Range   Glucose-Capillary 304 (*) 70 - 99 (mg/dL)  URINE MICROSCOPIC-ADD ON      Component Value Range   Squamous Epithelial / LPF FEW (*) RARE       Dg Chest 2 View  09/27/2011  *RADIOLOGY REPORT*  Clinical Data: Wheezing, shortness of breath  CHEST - 2 VIEW  Comparison: 05/07/2011  Findings: Mild cardiomegaly.  Mild central pulmonary vascular congestion.  No focal infiltrate or overt edema.  No effusion. Regional bones unremarkable.  IMPRESSION:  1.  Mild cardiomegaly and central pulmonary vascular congestion.  Original Report Authenticated By: Thora Lance III, M.D.     1. Hyperglycemia   2. Cellulitis of left lower leg       MDM  9:00p.m.  patient seen and evaluated. Patient in no acute distress.   Patient discussed with attending physician. We'll plan to treat left lower leg erythema has saline this with antibiotics. Patient's sugar has improved. There is no signs for DKA. Patient did have hyponatremia but was given IV fluids for this.     Angus Seller, [place] 09/28/11 [phone]

## 2011-09-27 NOTE — ED Notes (Signed)
Pt stated that her CBG have been so high her CBG machine could not read it. She stated that her CBG have been fluctuating for months. Pt stated that she has been having increased urination, thirsty, and hungry. Pt stated that she has been having vomiting and currently nausea. No pain. No respiratory distress. Will continue to monitor.

## 2011-09-27 NOTE — ED Notes (Signed)
Marked red area on LLE per PA orders. Will continue to monitor.

## 2011-09-27 NOTE — ED Notes (Addendum)
CBG completed. CBG 304

## 2011-09-27 NOTE — ED Notes (Signed)
Pt states that she has been having elevated CBG for months.  Pt states that she has been feeling sick, she has had nausea and vomiting and blurred vision today and he CBG has been "high" and she took her insulin and resulting CBG was 574.  Pt denies any infection in any area of her body.  Pt is alert and oriented in triage.

## 2011-09-27 NOTE — ED Notes (Addendum)
Made PA aware of pts O2 saturations dropping to 84% on RA. Placed pt on 2L O2 N/C. Saturations are 97% on 2L O2 N/C. Also told him CBG results and redness to R lower leg.

## 2011-09-28 NOTE — ED Notes (Signed)
Clindamycin IV completed prior to discharge.

## 2011-09-28 NOTE — ED Notes (Addendum)
Sent another note to pharmacy regarding abx. Will continue to monitor.

## 2011-09-28 NOTE — ED Provider Notes (Signed)
Medical screening examination/treatment/procedure(s) were performed by non-physician practitioner and as supervising physician I was immediately available for consultation/collaboration. This 51 year old female with insulin-dependent diabetes presents with hyperglycemia.  The patient's case is well described by the PA.  Following improvement with insulin, fluids, the patient was discharged in stable condition with antibiotics for her cellulitis.  Gerhard Munch, MD 09/28/11 [phone]

## 2011-09-28 NOTE — ED Notes (Signed)
Waiting on medication from Pharmacy. Message sent to pharmacy regarding ABX

## 2011-10-06 ENCOUNTER — Encounter (HOSPITAL_COMMUNITY): Payer: Self-pay

## 2011-10-06 ENCOUNTER — Other Ambulatory Visit: Payer: Self-pay

## 2011-10-06 ENCOUNTER — Emergency Department (HOSPITAL_COMMUNITY): Payer: Medicaid Other

## 2011-10-06 ENCOUNTER — Inpatient Hospital Stay (HOSPITAL_COMMUNITY)
Admission: EM | Admit: 2011-10-06 | Discharge: 2011-10-09 | DRG: 637 | Disposition: A | Discharge status: Home / Self Care | Payer: Medicaid Other | Attending: Internal Medicine | Admitting: Internal Medicine

## 2011-10-06 DIAGNOSIS — IMO0002 Reserved for concepts with insufficient information to code with codable children: Principal | ICD-10-CM | POA: Diagnosis present

## 2011-10-06 DIAGNOSIS — G9340 Encephalopathy, unspecified: Secondary | ICD-10-CM | POA: Diagnosis present

## 2011-10-06 DIAGNOSIS — R739 Hyperglycemia, unspecified: Secondary | ICD-10-CM

## 2011-10-06 DIAGNOSIS — E1165 Type 2 diabetes mellitus with hyperglycemia: Secondary | ICD-10-CM | POA: Diagnosis present

## 2011-10-06 DIAGNOSIS — S88119A Complete traumatic amputation at level between knee and ankle, unspecified lower leg, initial encounter: Secondary | ICD-10-CM

## 2011-10-06 DIAGNOSIS — E871 Hypo-osmolality and hyponatremia: Secondary | ICD-10-CM | POA: Diagnosis present

## 2011-10-06 DIAGNOSIS — S98139A Complete traumatic amputation of one unspecified lesser toe, initial encounter: Secondary | ICD-10-CM

## 2011-10-06 DIAGNOSIS — E118 Type 2 diabetes mellitus with unspecified complications: Principal | ICD-10-CM | POA: Diagnosis present

## 2011-10-06 DIAGNOSIS — Z794 Long term (current) use of insulin: Secondary | ICD-10-CM

## 2011-10-06 DIAGNOSIS — N179 Acute kidney failure, unspecified: Secondary | ICD-10-CM | POA: Diagnosis present

## 2011-10-06 DIAGNOSIS — E875 Hyperkalemia: Secondary | ICD-10-CM | POA: Diagnosis present

## 2011-10-06 DIAGNOSIS — E1142 Type 2 diabetes mellitus with diabetic polyneuropathy: Secondary | ICD-10-CM | POA: Diagnosis present

## 2011-10-06 DIAGNOSIS — E785 Hyperlipidemia, unspecified: Secondary | ICD-10-CM | POA: Diagnosis present

## 2011-10-06 HISTORY — DX: Hypothyroidism, unspecified: E03.9

## 2011-10-06 HISTORY — DX: Hyperlipidemia, unspecified: E78.5

## 2011-10-06 HISTORY — DX: Type 2 diabetes mellitus with diabetic neuropathy, unspecified: E11.40

## 2011-10-06 HISTORY — DX: Cellulitis, unspecified: L03.90

## 2011-10-06 HISTORY — DX: Major depressive disorder, single episode, unspecified: F32.9

## 2011-10-06 HISTORY — DX: Depression, unspecified: F32.A

## 2011-10-06 LAB — COMPREHENSIVE METABOLIC PANEL
ALT: 17 U/L (ref 0–35)
AST: 13 U/L (ref 0–37)
CO2: 26 mEq/L (ref 19–32)
Chloride: 89 mEq/L — ABNORMAL LOW (ref 96–112)
GFR calc non Af Amer: 36 mL/min — ABNORMAL LOW (ref >90)
Sodium: 125 mEq/L — ABNORMAL LOW (ref 135–145)
Total Bilirubin: 0.2 mg/dL — ABNORMAL LOW (ref 0.3–1.2)

## 2011-10-06 LAB — RAPID URINE DRUG SCREEN, HOSP PERFORMED
Amphetamines: NOT DETECTED
Benzodiazepines: NOT DETECTED
Opiates: NOT DETECTED

## 2011-10-06 LAB — GLUCOSE, CAPILLARY
Glucose-Capillary: 430 mg/dL — ABNORMAL HIGH (ref 70–99)
Glucose-Capillary: 416 mg/dL — ABNORMAL HIGH (ref 70–99)
Glucose-Capillary: 351 mg/dL — ABNORMAL HIGH (ref 70–99)

## 2011-10-06 LAB — CBC
HCT: 40.3 % (ref 36.0–46.0)
RBC: 4.78 MIL/uL (ref 3.87–5.11)
RDW: 13.8 % (ref 11.5–15.5)
WBC: 13 10*3/uL — ABNORMAL HIGH (ref 4.0–10.5)

## 2011-10-06 LAB — URINALYSIS, ROUTINE W REFLEX MICROSCOPIC
Bilirubin Urine: NEGATIVE
Hgb urine dipstick: NEGATIVE
Protein, ur: 30 mg/dL — AB
Urobilinogen, UA: 0.2 mg/dL (ref 0.0–1.0)

## 2011-10-06 LAB — DIFFERENTIAL
Basophils Absolute: 0 10*3/uL (ref 0.0–0.1)
Lymphocytes Relative: 32 % (ref 12–46)
Monocytes Absolute: 0.8 10*3/uL (ref 0.1–1.0)
Neutro Abs: 7.9 10*3/uL — ABNORMAL HIGH (ref 1.7–7.7)

## 2011-10-06 LAB — BASIC METABOLIC PANEL
CO2: 26 mEq/L (ref 19–32)
Chloride: 94 mEq/L — ABNORMAL LOW (ref 96–112)
Creatinine, Ser: 1.64 mg/dL — ABNORMAL HIGH (ref 0.50–1.10)
Glucose, Bld: 371 mg/dL — ABNORMAL HIGH (ref 70–99)

## 2011-10-06 LAB — URINE MICROSCOPIC-ADD ON

## 2011-10-06 MED ORDER — SODIUM CHLORIDE 0.9 % IV SOLN
INTRAVENOUS | Status: AC
  Administered 2011-10-06: 3.1 [IU]/h via INTRAVENOUS
  Filled 2011-10-06: qty 1

## 2011-10-06 MED ORDER — SODIUM CHLORIDE 0.9 % IV BOLUS (SEPSIS)
1000.0000 mL | Freq: Once | INTRAVENOUS | Status: AC
  Administered 2011-10-06: 1000 mL via INTRAVENOUS

## 2011-10-06 MED ORDER — INSULIN ASPART 100 UNIT/ML ~~LOC~~ SOLN
10.0000 [IU] | Freq: Once | SUBCUTANEOUS | Status: AC
  Administered 2011-10-06: 10 [IU] via INTRAVENOUS
  Filled 2011-10-06: qty 10

## 2011-10-06 NOTE — ED Provider Notes (Signed)
History     CSN: [id number]  Arrival date & time 10/06/11  1526   First MD Initiated Contact with Patient 10/06/11 1819      Chief Complaint  Patient presents with  . Fatigue  . Nausea    (Consider location/radiation/quality/duration/timing/severity/associated sxs/prior treatment) Patient is a 51 y.o. female presenting with weakness. The history is provided by the patient (Patient complains of weakness of her couple days with some nausea. She states that her sugars been very high.). No language interpreter was used.  Weakness Primary symptoms do not include headaches, syncope or seizures. The symptoms began 12 to 24 hours ago. The symptoms are unchanged. The neurological symptoms are diffuse. Context: nothing.  Additional symptoms include weakness. Additional symptoms do not include hallucinations. Medical issues do not include seizures or cerebral vascular accident. Workup history does not include EEG.    Past Medical History  Diagnosis Date  . Diabetes mellitus   . Hypertension   . Diabetic neuropathy   . Cellulitis   . Thyroid disease   . Hyperlipidemia     Past Surgical History  Procedure Date  . Amputation   . Leg amputation     right leg amputation below knee  . Toe amputation     left foot only 2 toes remaning.     History reviewed. No pertinent family history.  History  Substance Use Topics  . Smoking status: Current Everyday Smoker -- 1.0 packs/day  . Smokeless tobacco: Not on file  . Alcohol Use: No    OB History    Grav Para Term Preterm Abortions TAB SAB Ect Mult Living                  Review of Systems  Constitutional: Negative for fatigue.  HENT: Negative for congestion, sinus pressure and ear discharge.   Eyes: Negative for discharge.  Respiratory: Negative for cough.   Cardiovascular: Negative for chest pain and syncope.  Gastrointestinal: Negative for abdominal pain and diarrhea.  Genitourinary: Negative for frequency and hematuria.    Musculoskeletal: Negative for back pain.  Skin: Negative for rash.  Neurological: Positive for weakness. Negative for seizures and headaches.  Hematological: Negative.   Psychiatric/Behavioral: Negative for hallucinations.    Allergies  Gadolinium; Ivp dye; Metformin; and Penicillins  Home Medications   Current Outpatient Rx  Name Route Sig Dispense Refill  . CLINDAMYCIN HCL 300 MG PO CAPS Oral Take 300 mg by mouth 4 (four) times daily. X 7 days    . DULOXETINE HCL 60 MG PO CPEP Oral Take 60 mg by mouth every morning.      . FUROSEMIDE 20 MG PO TABS Oral Take 20 mg by mouth every morning.      Marland Kitchen GABAPENTIN 800 MG PO TABS Oral Take 800 mg by mouth 3 (three) times daily.      Marland Kitchen GEMFIBROZIL 600 MG PO TABS Oral Take 600 mg by mouth 2 (two) times daily before a meal.    . HYDROCODONE-ACETAMINOPHEN 10-500 MG PO TABS Oral Take 1 tablet by mouth every 6 (six) hours as needed. For pain    . INSULIN ASPART 100 UNIT/ML [place] SOLN Subcutaneous Inject 15 Units into the skin 3 (three) times daily before meals. Inject 15 units + sliding scale under the skin at 6:30am, 11:30am, and at 4:30am.    . INSULIN DETEMIR 100 UNIT/ML [place] SOLN Subcutaneous Inject 56 Units into the skin at bedtime.     Marland Kitchen LEVOTHYROXINE SODIUM 25 MCG PO TABS Oral  Take 25 mcg by mouth every morning.      Marland Kitchen LISINOPRIL 5 MG PO TABS Oral Take 5 mg by mouth daily after breakfast.     . METOCLOPRAMIDE HCL 5 MG PO TABS Oral Take 5 mg by mouth 3 (three) times daily.      . OMEGA-3-ACID ETHYL ESTERS 1 G PO CAPS Oral Take 2 g by mouth 2 (two) times daily. Take 2 grams at 8am and 2 grams at 4pm.    . OXYBUTYNIN CHLORIDE 5 MG PO TABS Oral Take 5 mg by mouth every morning.      Marland Kitchen TRAMADOL HCL ER 100 MG PO TB24 Oral Take 100 mg by mouth every morning.        BP 104/56  Pulse 58  Temp(Src) 97.9 F (36.6 C) (Oral)  Resp 16  SpO2 95%  LMP 09/29/2011  Physical Exam  Constitutional: She is oriented to person, place, and time. She appears  well-developed.  HENT:  Head: Normocephalic and atraumatic.  Eyes: Conjunctivae and EOM are normal. No scleral icterus.  Neck: Neck supple. No thyromegaly present.  Cardiovascular: Normal rate and regular rhythm.  Exam reveals no gallop and no friction rub.   No murmur heard. Pulmonary/Chest: No stridor. She has no wheezes. She has no rales. She exhibits no tenderness.  Abdominal: She exhibits no distension. There is no tenderness. There is no rebound.  Musculoskeletal: Normal range of motion. She exhibits no edema.       bka right leg  Lymphadenopathy:    She has no cervical adenopathy.  Neurological: She is oriented to person, place, and time. Coordination normal.  Skin: No rash noted. No erythema.  Psychiatric: She has a normal mood and affect. Her behavior is normal.    ED Course  Procedures (including critical care time)  Labs Reviewed  GLUCOSE, CAPILLARY - Abnormal; Notable for the following:    Glucose-Capillary 424 (*)    All other components within normal limits  GLUCOSE, CAPILLARY - Abnormal; Notable for the following:    Glucose-Capillary 430 (*)    All other components within normal limits  CBC - Abnormal; Notable for the following:    WBC 13.0 (*)    All other components within normal limits  DIFFERENTIAL - Abnormal; Notable for the following:    Neutro Abs 7.9 (*)    Lymphs Abs 4.1 (*)    All other components within normal limits  COMPREHENSIVE METABOLIC PANEL - Abnormal; Notable for the following:    Sodium 125 (*)    Potassium 6.0 (*)    Chloride 89 (*)    Glucose, Bld 429 (*)    BUN 39 (*)    Creatinine, Ser 1.63 (*)    Total Bilirubin 0.2 (*)    GFR calc non Af Amer 36 (*)    GFR calc Af Amer 41 (*)    All other components within normal limits  URINALYSIS, ROUTINE W REFLEX MICROSCOPIC   No results found.   No diagnosis found.   Date: 10/06/2011  Rate: 57  Rhythm: sinus bradycardia  QRS Axis: normal  Intervals: normal  ST/T Wave  abnormalities: normal  Conduction Disutrbances:none  Narrative Interpretation:   Old EKG Reviewed: none available    MDM    hyperglycemia      Benny Lennert, MD 10/06/11 [phone]

## 2011-10-06 NOTE — H&P (Signed)
PCP:  [facility]   Chief Complaint:   Sleepy, nausea  HPI: [name] is a 51 y.o. female   has a past medical history of Diabetes mellitus; Hypertension; Diabetic neuropathy; Cellulitis; Thyroid disease; and Hyperlipidemia.   Patient drove herself to ER after she has been feeling lethargic nauseous with a mild cough and some body aches for the past 2 days. Patient lives at home with her son. She is very lethargic and unable to maintain conversation. She does arouse with verbal stimulation but unable to complete a sentence before she falls back asleep. I'm unable to obtain any significant history from her given her mental status.  Throughout the little formation she has provided it seems she has recently been treated with clindamycin for possible cellulitis of her left foot. 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   Review of Systems:  Unable to obtain given lethargy  Past Medical History: Past Medical History  Diagnosis Date  . Diabetes mellitus   . Hypertension   . Diabetic neuropathy   . Cellulitis   . Thyroid disease   . Hyperlipidemia    Past Surgical History  Procedure Date  . Amputation   . Leg amputation     right leg amputation below knee  . Toe amputation     left foot only 2 toes remaning.      Medications: Prior to Admission medications   Medication Sig Start Date End Date Taking? Authorizing Provider  clindamycin (CLEOCIN) 300 MG capsule Take 300 mg by mouth 4 (four) times daily. X 7 days 09/27/11 10/07/11 Yes Angus Seller, PA  DULoxetine (CYMBALTA) 60 MG capsule Take 60 mg by mouth every morning.     Yes Historical Provider, MD  furosemide (LASIX) 20 MG tablet Take 20 mg by mouth every morning.     Yes Historical Provider, MD  gabapentin (NEURONTIN) 800 MG tablet Take 800 mg by mouth 3 (three) times daily.     Yes Historical Provider, MD  gemfibrozil (LOPID) 600 MG tablet Take 600 mg by mouth 2 (two) times daily before a  meal.   Yes Historical Provider, MD  HYDROcodone-acetaminophen (LORTAB) 10-500 MG per tablet Take 1 tablet by mouth every 6 (six) hours as needed. For pain   Yes Historical Provider, MD  insulin aspart (NOVOLOG) 100 UNIT/ML injection Inject 15 Units into the skin 3 (three) times daily before meals. Inject 15 units + sliding scale under the skin at 6:30am, 11:30am, and at 4:30am.   Yes Historical Provider, MD  insulin detemir (LEVEMIR) 100 UNIT/ML injection Inject 56 Units into the skin at bedtime.    Yes Historical Provider, MD  levothyroxine (SYNTHROID, LEVOTHROID) 25 MCG tablet Take 25 mcg by mouth every morning.     Yes Historical Provider, MD  lisinopril (PRINIVIL,ZESTRIL) 5 MG tablet Take 5 mg by mouth daily after breakfast.    Yes Historical Provider, MD  metoCLOPramide (REGLAN) 5 MG tablet Take 5 mg by mouth 3 (three) times daily.     Yes Historical Provider, MD  omega-3 acid ethyl esters (LOVAZA) 1 G capsule Take 2 g by mouth 2 (two) times daily. Take 2 grams at 8am and 2 grams at 4pm.   Yes Historical Provider, MD  oxybutynin (DITROPAN) 5 MG tablet Take 5 mg by mouth every morning.     Yes Historical Provider, MD  traMADol (ULTRAM-ER) 100 MG 24 hr tablet Take 100 mg by mouth every morning.     Yes  Historical Provider, MD    Allergies:   Allergies  Allergen Reactions  . Gadolinium      Code: VOM, Desc: Pt began vomiting immed post infusion of multihance, Onset Date: [id number]   . Ivp Dye (Iodinated Diagnostic Agents) Nausea And Vomiting  . Metformin     REACTION: diarrhea  . Penicillins Hives    Social History:  lives at home with son   reports that she has been smoking.  She does not have any smokeless tobacco history on file. She reports that she does not drink alcohol or use illicit drugs.   Family History: family history is not on file. noncontributory   Physical Exam: Patient Vitals for the past 24 hrs:  BP Temp Temp src Pulse Resp SpO2  10/06/11 2030 98/48 mmHg - -  52  - 96 %  10/06/11 1922 104/56 mmHg 97.9 F (36.6 C) Oral 58  16  95 %  10/06/11 1531 103/56 mmHg 97.7 F (36.5 C) Oral 56  18  95 %    1. General:  in No Acute distress 2. Psychological: Alert and Oriented 3. Head/ENT:   Moist  Mucous Membranes                          Head Non traumatic, neck supple                          Normal  Dentition 4. SKIN:  decreased Skin turgor,  Skin  Dry somewhat disheveled with occasional excoriations 5. Heart: Regular rate and rhythm no Murmur, Rub or gallop 6. Lungs: Clear to auscultation bilaterally, no wheezes or crackles  distant breath sounds 7. Abdomen: Soft, non-tender, Non distended obese 8. Lower extremities: no clubbing, cyanosis, or edema on the left right leg is surgically absent below the knee 9. Neurologically Grossly intact, moving all 4 extremities equally, not able to cooperate with physical neurological exam given lethargy 10. MSK: Normal range of motion  body mass index is unknown because there is no height or weight on file.   Labs on Admission:   [facility] 10/06/11 1830  NA 125*  K 6.0*  CL 89*  CO2 26  GLUCOSE 429*  BUN 39*  CREATININE 1.63*  CALCIUM 10.0  MG --  PHOS --    Basename 10/06/11 1830  AST 13  ALT 17  ALKPHOS 115  BILITOT 0.2*  PROT 8.2  ALBUMIN 3.7   No results found for this basename: LIPASE:2,AMYLASE:2 in the last 72 hours  Basename 10/06/11 1830  WBC 13.0*  NEUTROABS 7.9*  HGB 13.9  HCT 40.3  MCV 84.3  PLT 305   No results found for this basename: CKTOTAL:3,CKMB:3,CKMBINDEX:3,TROPONINI:3 in the last 72 hours No results found for this basename: TSH,T4TOTAL,FREET3,T3FREE,THYROIDAB in the last 72 hours No results found for this basename: VITAMINB12:2,FOLATE:2,FERRITIN:2,TIBC:2,IRON:2,RETICCTPCT:2 in the last 72 hours Lab Results  Component Value Date   HGBA1C 12.7* 05/03/2011    The CrCl is unknown because both a height and weight (above a minimum accepted value) are required for this  calculation. ABG    Component Value Date/Time   TCO2 27 04/16/2011 2230     Lab Results  Component Value Date   DDIMER  Value: 0.89        AT THE INHOUSE ESTABLISHED CUTOFF VALUE OF 0.48 ug/mL FEU, THIS ASSAY HAS BEEN DOCUMENTED IN THE LITERATURE TO HAVE A SENSITIVITY AND NEGATIVE PREDICTIVE VALUE OF AT  LEAST 98 TO 99%.  THE TEST RESULT SHOULD BE CORRELATED WITH AN ASSESSMENT OF THE CLINICAL PROBABILITY OF DVT / VTE.* 05/19/2010     Other results:  I have pearsonaly reviewed this: ECG REPORT  Rate: 74  Rhythm: Normal sinus rhythm  ST&T Change: No ischemic changes  UA ordered but not obtained yet   Cultures:    Component Value Date/Time   SDES BLOOD ARM RIGHT 05/06/2011 1715   SPECREQUEST BOTTLES DRAWN AEROBIC AND ANAEROBIC 10CC 05/06/2011 1715   CULT NO GROWTH 5 DAYS 05/06/2011 1715   REPTSTATUS 05/12/2011 FINAL 05/06/2011 1715       Radiological Exams on Admission: Dg Chest 2 View  10/06/2011  *RADIOLOGY REPORT*  Clinical Data: Weakness.  Cough.  Hypertension.  Diabetic.  CHEST - 2 VIEW  Comparison: 09/27/2011  Findings: The frontal view is degraded by patient body habitus and AP semi upright view. Midline trachea.  Normal heart size for level of inspiration.  No pleural effusion or pneumothorax.  No congestive failure.  Low lung volumes with resultant pulmonary interstitial prominence. No lobar consolidation.  IMPRESSION:  1. No acute cardiopulmonary disease. 2. Decreased sensitivity and specificity exam due to technique related factors, as described above.  Original Report Authenticated By: Consuello Bossier, M.D.   Ct Head Wo Contrast  10/06/2011  *RADIOLOGY REPORT*  Clinical Data: Altered mental status; headache and dizziness.  CT HEAD WITHOUT CONTRAST  Technique:  Contiguous axial images were obtained from the base of the skull through the vertex without contrast.  Comparison: CT of the head performed 07/25/2011, and MRI/MRA of the brain performed 05/03/2011  Findings: There is no  evidence of acute infarction, mass lesion, or intra- or extra-axial hemorrhage on CT.  The posterior fossa, including the cerebellum, brainstem and fourth ventricle, is within normal limits.  The third and lateral ventricles, and basal ganglia are unremarkable in appearance.  The cerebral hemispheres are symmetric in appearance, with normal gray- white differentiation.  No mass effect or midline shift is seen.  There is no evidence of fracture; visualized osseous structures are unremarkable in appearance.  The visualized portions of the orbits are within normal limits.  The paranasal sinuses and mastoid air cells are well-aerated.  No significant soft tissue abnormalities are seen.  IMPRESSION: Unremarkable noncontrast CT of the head.  Original Report Authenticated By: Tonia Ghent, M.D.    Assessment/Plan  This is a 51 year old female bit difficult to control diabetes, progressive kidney failure, hyponatremia which seems to be recurrent, new hyper kalemia. Presents a significant left lethargy and leukocytosis unknown etiology.  Present on Admission:   .Hyperglycemia - patient has a history of long-standing poorly controlled diabetes. She states she has been taking her insulin.  We'll admit put on glucose stabilizer, admit to stepdown and watch carefully. Obtain UA 2 and 8 for source of infection repeat chest x-ray as her prior one is poor quality. Also will cycle cardiac markers. No ev .DIABETES MELLITUS, TYPE II - she'll probably benefit from diabetes education given uncontrolled diabetes  .Encephalopathy - etiology is unclear at this point, CT scan of the head is unremarkable patient had had elevated blood sugars in the past that have not seen history of such severe lethargy. Her sodium is somewhat low but it does correct to 130 would not explain such degree of lethargy I don't think. Await results of UA urine toxicology screen, and ABG  .Hyponatremia - this corrects to 130 will obtain urine panel,  check TSH .Hyperkalemia - DC  ACEi, recheck right now after aggressive fluid resuscitation and insulin if still significantly elevated will initiate treatment. ECG did not show any changes .Acute renal failure - patient have had steady rise in her cr for some months now will obtain renal US stop ACEi.   Prophylaxis: hep [place] Protonix  CODE STATUS: Full code   Corran Lalone 10/06/2011, 9:46 PM

## 2011-10-06 NOTE — ED Notes (Signed)
Pt states that since yesterday she has been feeling very weak with generalized malaise. She states that last night she was nauseated. Pt states that she was recently seen for the same complaint and has been taking her insulin with no reduction in her blood sugars in the past 24 hours. She is alert and oriented. Pt states that her cbg's have been running high at home. Pt denies any wounds but states that a few days ago was very red on the left side. Pt states that she was just seen here on wed with the same complaints.

## 2011-10-06 NOTE — ED Notes (Signed)
Received from xray. Pt sleeping. Arouses without difficulty. Iv infusing without difficulty.

## 2011-10-06 NOTE — ED Notes (Signed)
Patient states blood sugars read high multiple times at home when tested.  Patient is sleepy and before coming to ED mother who is at bedside is sleeping a lot at home.  Patient is ax4 airway intact bilateral equal chest rise and fall.  Patient also is a right  below the knee amputee.  Resting comfortably on stretcher.

## 2011-10-07 ENCOUNTER — Emergency Department (HOSPITAL_COMMUNITY): Payer: Medicaid Other

## 2011-10-07 ENCOUNTER — Encounter (HOSPITAL_COMMUNITY): Payer: Self-pay | Admitting: *Deleted

## 2011-10-07 DIAGNOSIS — R7989 Other specified abnormal findings of blood chemistry: Secondary | ICD-10-CM

## 2011-10-07 DIAGNOSIS — E1165 Type 2 diabetes mellitus with hyperglycemia: Secondary | ICD-10-CM

## 2011-10-07 DIAGNOSIS — R4182 Altered mental status, unspecified: Secondary | ICD-10-CM

## 2011-10-07 DIAGNOSIS — J9600 Acute respiratory failure, unspecified whether with hypoxia or hypercapnia: Secondary | ICD-10-CM

## 2011-10-07 LAB — CBC
Hemoglobin: 12.1 g/dL (ref 12.0–15.0)
MCH: 28.8 pg (ref 26.0–34.0)
MCHC: 33.9 g/dL (ref 30.0–36.0)
Platelets: 182 10*3/uL (ref 150–400)
RDW: 14 % (ref 11.5–15.5)

## 2011-10-07 LAB — POCT I-STAT 3, ART BLOOD GAS (G3+)
Acid-base deficit: 2 mmol/L (ref 0.0–2.0)
Bicarbonate: 27.9 mEq/L — ABNORMAL HIGH (ref 20.0–24.0)
O2 Saturation: 54 %
O2 Saturation: 100 %
TCO2: 29 mmol/L (ref 0–100)
TCO2: 31 mmol/L (ref 0–100)
pCO2 arterial: 65.8 mmHg (ref 35.0–45.0)
pCO2 arterial: 67 mmHg (ref 35.0–45.0)
pH, Arterial: 7.273 — ABNORMAL LOW (ref 7.350–7.400)
pO2, Arterial: 88 mmHg (ref 80.0–100.0)

## 2011-10-07 LAB — CARDIAC PANEL(CRET KIN+CKTOT+MB+TROPI)
CK, MB: 3 ng/mL (ref 0.3–4.0)
CK, MB: 7.2 ng/mL (ref 0.3–4.0)
CK, MB: 10.4 ng/mL (ref 0.3–4.0)
Relative Index: 1.9 (ref 0.0–2.5)
Relative Index: 1.3 (ref 0.0–2.5)
Total CK: 101 U/L (ref 7–177)
Total CK: 803 U/L — ABNORMAL HIGH (ref 7–177)

## 2011-10-07 LAB — CREATININE, URINE, RANDOM: Creatinine, Urine: 76.89 mg/dL

## 2011-10-07 LAB — COMPREHENSIVE METABOLIC PANEL
ALT: 14 U/L (ref 0–35)
CO2: 26 mEq/L (ref 19–32)
Calcium: 8.7 mg/dL (ref 8.4–10.5)
Chloride: 99 mEq/L (ref 96–112)
GFR calc Af Amer: 66 mL/min — ABNORMAL LOW (ref >90)
GFR calc non Af Amer: 57 mL/min — ABNORMAL LOW (ref >90)
Glucose, Bld: 144 mg/dL — ABNORMAL HIGH (ref 70–99)
Sodium: 135 mEq/L (ref 135–145)
Total Bilirubin: 0.2 mg/dL — ABNORMAL LOW (ref 0.3–1.2)

## 2011-10-07 LAB — SODIUM, URINE, RANDOM: Sodium, Ur: 30 mEq/L

## 2011-10-07 LAB — GLUCOSE, CAPILLARY
Glucose-Capillary: 113 mg/dL — ABNORMAL HIGH (ref 70–99)
Glucose-Capillary: 117 mg/dL — ABNORMAL HIGH (ref 70–99)
Glucose-Capillary: 161 mg/dL — ABNORMAL HIGH (ref 70–99)
Glucose-Capillary: 237 mg/dL — ABNORMAL HIGH (ref 70–99)
Glucose-Capillary: 245 mg/dL — ABNORMAL HIGH (ref 70–99)
Glucose-Capillary: 265 mg/dL — ABNORMAL HIGH (ref 70–99)
Glucose-Capillary: 293 mg/dL — ABNORMAL HIGH (ref 70–99)

## 2011-10-07 LAB — TSH: TSH: 2.238 u[IU]/mL (ref 0.350–4.500)

## 2011-10-07 LAB — MRSA PCR SCREENING: MRSA by PCR: POSITIVE — AB

## 2011-10-07 MED ORDER — ALBUTEROL SULFATE (5 MG/ML) 0.5% IN NEBU: 2.5000 mg | INHALATION_SOLUTION | RESPIRATORY_TRACT | Status: DC | PRN

## 2011-10-07 MED ORDER — GEMFIBROZIL 600 MG PO TABS
600.0000 mg | ORAL_TABLET | Freq: Two times a day (BID) | ORAL | Status: DC
  Administered 2011-10-07 – 2011-10-09 (×5): 600 mg via ORAL
  Filled 2011-10-07 (×7): qty 1

## 2011-10-07 MED ORDER — METOCLOPRAMIDE HCL 5 MG PO TABS
5.0000 mg | ORAL_TABLET | Freq: Three times a day (TID) | ORAL | Status: DC
  Administered 2011-10-07 – 2011-10-09 (×8): 5 mg via ORAL
  Filled 2011-10-07 (×10): qty 1

## 2011-10-07 MED ORDER — CHLORHEXIDINE GLUCONATE CLOTH 2 % EX PADS
6.0000 | MEDICATED_PAD | Freq: Every day | CUTANEOUS | Status: DC
  Administered 2011-10-07 – 2011-10-09 (×3): 6 via TOPICAL

## 2011-10-07 MED ORDER — SODIUM CHLORIDE 0.9 % IV SOLN
250.0000 mL | INTRAVENOUS | Status: DC | PRN
  Administered 2011-10-07: 250 mL via INTRAVENOUS

## 2011-10-07 MED ORDER — INSULIN GLARGINE 100 UNIT/ML ~~LOC~~ SOLN
50.0000 [IU] | Freq: Every day | SUBCUTANEOUS | Status: DC
  Administered 2011-10-07 – 2011-10-08 (×2): 50 [IU] via SUBCUTANEOUS
  Filled 2011-10-07: qty 3

## 2011-10-07 MED ORDER — SODIUM CHLORIDE 0.9 % IV SOLN
INTRAVENOUS | Status: AC
  Filled 2011-10-07: qty 1

## 2011-10-07 MED ORDER — GUAIFENESIN-DM 100-10 MG/5ML PO SYRP: 5.0000 mL | ORAL_SOLUTION | ORAL | Status: DC | PRN

## 2011-10-07 MED ORDER — LEVOTHYROXINE SODIUM 25 MCG PO TABS
25.0000 ug | ORAL_TABLET | Freq: Every day | ORAL | Status: DC
  Administered 2011-10-08 – 2011-10-09 (×2): 25 ug via ORAL
  Filled 2011-10-07 (×4): qty 1

## 2011-10-07 MED ORDER — ONDANSETRON HCL 4 MG/2ML IJ SOLN: 4.0000 mg | Freq: Four times a day (QID) | INTRAMUSCULAR | Status: DC | PRN

## 2011-10-07 MED ORDER — HEPARIN SODIUM (PORCINE) 5000 UNIT/ML IJ SOLN
5000.0000 [IU] | Freq: Three times a day (TID) | INTRAMUSCULAR | Status: DC
  Administered 2011-10-07 – 2011-10-09 (×8): 5000 [IU] via SUBCUTANEOUS
  Filled 2011-10-07 (×11): qty 1

## 2011-10-07 MED ORDER — INSULIN ASPART 100 UNIT/ML ~~LOC~~ SOLN
0.0000 [IU] | Freq: Every day | SUBCUTANEOUS | Status: DC
  Administered 2011-10-07: 2 [IU] via SUBCUTANEOUS

## 2011-10-07 MED ORDER — ACETAMINOPHEN 650 MG RE SUPP: 650.0000 mg | Freq: Four times a day (QID) | RECTAL | Status: DC | PRN

## 2011-10-07 MED ORDER — INSULIN ASPART 100 UNIT/ML ~~LOC~~ SOLN
6.0000 [IU] | Freq: Three times a day (TID) | SUBCUTANEOUS | Status: DC
  Administered 2011-10-07 – 2011-10-09 (×6): 6 [IU] via SUBCUTANEOUS

## 2011-10-07 MED ORDER — SODIUM CHLORIDE 0.9 % IJ SOLN
3.0000 mL | Freq: Two times a day (BID) | INTRAMUSCULAR | Status: DC
  Administered 2011-10-07 – 2011-10-09 (×6): 3 mL via INTRAVENOUS

## 2011-10-07 MED ORDER — ALUM & MAG HYDROXIDE-SIMETH 200-200-20 MG/5ML PO SUSP: 30.0000 mL | Freq: Four times a day (QID) | ORAL | Status: DC | PRN

## 2011-10-07 MED ORDER — ACETAMINOPHEN 325 MG PO TABS: 650.0000 mg | ORAL_TABLET | Freq: Four times a day (QID) | ORAL | Status: DC | PRN

## 2011-10-07 MED ORDER — NALOXONE HCL 0.4 MG/ML IJ SOLN
INTRAMUSCULAR | Status: AC
  Administered 2011-10-07: 0.4 mg via INTRAVENOUS
  Filled 2011-10-07: qty 1

## 2011-10-07 MED ORDER — ASPIRIN EC 81 MG PO TBEC
81.0000 mg | DELAYED_RELEASE_TABLET | Freq: Every day | ORAL | Status: DC
  Administered 2011-10-07 – 2011-10-09 (×3): 81 mg via ORAL
  Filled 2011-10-07 (×3): qty 1

## 2011-10-07 MED ORDER — INSULIN GLARGINE 100 UNIT/ML ~~LOC~~ SOLN: 50.0000 [IU] | Freq: Every day | SUBCUTANEOUS | Status: DC

## 2011-10-07 MED ORDER — ONDANSETRON HCL 4 MG PO TABS: 4.0000 mg | ORAL_TABLET | Freq: Four times a day (QID) | ORAL | Status: DC | PRN

## 2011-10-07 MED ORDER — SODIUM CHLORIDE 0.9 % IV SOLN
INTRAVENOUS | Status: AC
  Administered 2011-10-07: via INTRAVENOUS
  Administered 2011-10-07: 20 mL via INTRAVENOUS

## 2011-10-07 MED ORDER — NALOXONE HCL 0.4 MG/ML IJ SOLN
0.4000 mg | Freq: Once | INTRAMUSCULAR | Status: AC
  Administered 2011-10-07: 0.4 mg via INTRAVENOUS

## 2011-10-07 MED ORDER — DULOXETINE HCL 60 MG PO CPEP
60.0000 mg | ORAL_CAPSULE | ORAL | Status: DC
  Administered 2011-10-07 – 2011-10-09 (×3): 60 mg via ORAL
  Filled 2011-10-07 (×4): qty 1

## 2011-10-07 MED ORDER — MUPIROCIN 2 % EX OINT
1.0000 "application " | TOPICAL_OINTMENT | Freq: Two times a day (BID) | CUTANEOUS | Status: DC
  Administered 2011-10-07 – 2011-10-09 (×4): 1 via NASAL
  Filled 2011-10-07 (×3): qty 22

## 2011-10-07 MED ORDER — OXYBUTYNIN CHLORIDE 5 MG PO TABS
5.0000 mg | ORAL_TABLET | Freq: Every day | ORAL | Status: DC
  Administered 2011-10-07 – 2011-10-09 (×3): 5 mg via ORAL
  Filled 2011-10-07 (×3): qty 1

## 2011-10-07 MED ORDER — INSULIN ASPART 100 UNIT/ML ~~LOC~~ SOLN
0.0000 [IU] | Freq: Three times a day (TID) | SUBCUTANEOUS | Status: DC
  Administered 2011-10-07 – 2011-10-08 (×3): 11 [IU] via SUBCUTANEOUS
  Administered 2011-10-09: 4 [IU] via SUBCUTANEOUS
  Administered 2011-10-09: 17 [IU] via SUBCUTANEOUS
  Filled 2011-10-07: qty 3

## 2011-10-07 NOTE — ED Notes (Signed)
Phoned floor; receiving RN unable to take report. Left number for RN to call.

## 2011-10-07 NOTE — H&P (Signed)
Patient name: [name] Medical record number: [id number] Date of birth: 05-08-1961 Age: 51 y.o. Gender: female PCP: Lehman Prom, NP, NP  PCCM ADMISSION NOTE  Date: 10/07/2011  History of Present Illness: [name] is a 51 year old lady who presented to the emergency department with altered mental status. She drove herself to the ED because her blood sugars were too high. On arrival she was minimally responsive and found to have a mild respiratory acidosis. She was started on BiPAP and this remained relatively stable. There were no step down beds available and we were consulted for ICU admission. On my initial interview the patient was minimally responsive to sternal rub. After administration of 0.4 mg of Narcan the patient woke up and was speaking in complete sentences. She gave a history of having blood sugars that were not responsive to her insulin and this was the reason for her coming to the ED.  Lines/tubes :   Microbiology/Sepsis markers: None  Anti-infectives:  Anti-infectives    None      Best Practice/Protocols:  VTE Prophylaxis: Heparin (SQ)   Consults:      Studies: Dg Chest 2 View  10/06/2011  *RADIOLOGY REPORT*  Clinical Data: Weakness.  Cough.  Hypertension.  Diabetic.  CHEST - 2 VIEW  Comparison: 09/27/2011  Findings: The frontal view is degraded by patient body habitus and AP semi upright view. Midline trachea.  Normal heart size for level of inspiration.  No pleural effusion or pneumothorax.  No congestive failure.  Low lung volumes with resultant pulmonary interstitial prominence. No lobar consolidation.  IMPRESSION:  1. No acute cardiopulmonary disease. 2. Decreased sensitivity and specificity exam due to technique related factors, as described above.  Original Report Authenticated By: Consuello Bossier, M.D.   Dg Chest 2 View  09/27/2011  *RADIOLOGY REPORT*  Clinical Data: Wheezing, shortness of breath  CHEST - 2 VIEW  Comparison: 05/07/2011  Findings:  Mild cardiomegaly.  Mild central pulmonary vascular congestion.  No focal infiltrate or overt edema.  No effusion. Regional bones unremarkable.  IMPRESSION:  1.  Mild cardiomegaly and central pulmonary vascular congestion.  Original Report Authenticated By: Osa Craver, M.D.   Ct Head Wo Contrast  10/06/2011  *RADIOLOGY REPORT*  Clinical Data: Altered mental status; headache and dizziness.  CT HEAD WITHOUT CONTRAST  Technique:  Contiguous axial images were obtained from the base of the skull through the vertex without contrast.  Comparison: CT of the head performed 07/25/2011, and MRI/MRA of the brain performed 05/03/2011  Findings: There is no evidence of acute infarction, mass lesion, or intra- or extra-axial hemorrhage on CT.  The posterior fossa, including the cerebellum, brainstem and fourth ventricle, is within normal limits.  The third and lateral ventricles, and basal ganglia are unremarkable in appearance.  The cerebral hemispheres are symmetric in appearance, with normal gray- white differentiation.  No mass effect or midline shift is seen.  There is no evidence of fracture; visualized osseous structures are unremarkable in appearance.  The visualized portions of the orbits are within normal limits.  The paranasal sinuses and mastoid air cells are well-aerated.  No significant soft tissue abnormalities are seen.  IMPRESSION: Unremarkable noncontrast CT of the head.  Original Report Authenticated By: Tonia Ghent, M.D.     Events:   Past Medical History  Diagnosis Date  . Diabetes mellitus   . Hypertension   . Diabetic neuropathy   . Cellulitis   . Thyroid disease   . Hyperlipidemia  Past Surgical History  Procedure Date  . Amputation   . Leg amputation     right leg amputation below knee  . Toe amputation     left foot only 2 toes remaning.     History reviewed. No pertinent family history.  Social History:  reports that she has been smoking.  She does not have  any smokeless tobacco history on file. She reports that she does not drink alcohol or use illicit drugs.  Allergies:  Allergies  Allergen Reactions  . Gadolinium      Code: VOM, Desc: Pt began vomiting immed post infusion of multihance, Onset Date: 16109604   . Ivp Dye (Iodinated Diagnostic Agents) Nausea And Vomiting  . Metformin     REACTION: diarrhea  . Penicillins Hives    Medications:  Prior to Admission medications   Medication Sig Start Date End Date Taking? Authorizing Provider  clindamycin (CLEOCIN) 300 MG capsule Take 300 mg by mouth 4 (four) times daily. X 7 days 09/27/11 10/07/11 Yes Angus Seller, PA  DULoxetine (CYMBALTA) 60 MG capsule Take 60 mg by mouth every morning.     Yes Historical Provider, MD  furosemide (LASIX) 20 MG tablet Take 20 mg by mouth every morning.     Yes Historical Provider, MD  gabapentin (NEURONTIN) 800 MG tablet Take 800 mg by mouth 3 (three) times daily.     Yes Historical Provider, MD  gemfibrozil (LOPID) 600 MG tablet Take 600 mg by mouth 2 (two) times daily before a meal.   Yes Historical Provider, MD  HYDROcodone-acetaminophen (LORTAB) 10-500 MG per tablet Take 1 tablet by mouth every 6 (six) hours as needed. For pain   Yes Historical Provider, MD  insulin aspart (NOVOLOG) 100 UNIT/ML injection Inject 15 Units into the skin 3 (three) times daily before meals. Inject 15 units + sliding scale under the skin at 6:30am, 11:30am, and at 4:30am.   Yes Historical Provider, MD  insulin detemir (LEVEMIR) 100 UNIT/ML injection Inject 56 Units into the skin at bedtime.    Yes Historical Provider, MD  levothyroxine (SYNTHROID, LEVOTHROID) 25 MCG tablet Take 25 mcg by mouth every morning.     Yes Historical Provider, MD  lisinopril (PRINIVIL,ZESTRIL) 5 MG tablet Take 5 mg by mouth daily after breakfast.    Yes Historical Provider, MD  metoCLOPramide (REGLAN) 5 MG tablet Take 5 mg by mouth 3 (three) times daily.     Yes Historical Provider, MD  omega-3 acid  ethyl esters (LOVAZA) 1 G capsule Take 2 g by mouth 2 (two) times daily. Take 2 grams at 8am and 2 grams at 4pm.   Yes Historical Provider, MD  oxybutynin (DITROPAN) 5 MG tablet Take 5 mg by mouth every morning.     Yes Historical Provider, MD  traMADol (ULTRAM-ER) 100 MG 24 hr tablet Take 100 mg by mouth every morning.     Yes Historical Provider, MD    A comprehensive review of systems was negative.  Vital signs for last 24 hours: Temp:  [97.7 F (36.5 C)-98 F (36.7 C)] 98 F (36.7 C) (02/16 0230) Pulse Rate:  [52-73] 66  (02/16 0345) Resp:  [11-19] 19  (02/16 0345) BP: (93-126)/(43-82) 93/56 mmHg (02/16 0412) SpO2:  [93 %-100 %] 98 % (02/16 0412) FiO2 (%):  [29 %] 29 % (02/16 0230)  Hemodynamic parameters for last 24 hours:    Intake/Output from previous day:    Intake/Output this shift:    Vent settings for last 24  hours: Vent Mode:  [-]  FiO2 (%):  [29 %] 29 %  Physical Exam:  General: No apparent distress. Eyes: Anicteric sclerae. ENT: Oropharynx clear. Moist mucous membranes. No thrush Lymph: No cervical, supraclavicular, or axillary lymphadenopathy. Heart: Normal S1, S2. No murmurs, rubs, or gallops appreciated. No bruits, equal pulses. Lungs: Normal excursion, no dullness to percussion. Good air movement bilaterally, without wheezes or crackles. Normal upper airway sounds without evidence of stridor. Abdomen: Abdomen soft, non-tender and not distended, normoactive bowel sounds. No hepatosplenomegaly or masses. Musculoskeletal: No clubbing or synovitis. Skin: No rashes or lesions Neuro: No focal neurologic deficits.  LAB RESULT Results for orders placed during the hospital encounter of 10/06/11 (from the past 24 hour(s))  GLUCOSE, CAPILLARY     Status: Abnormal   Collection Time   10/06/11  3:35 PM      Component Value Range   Glucose-Capillary 424 (*) 70 - 99 (mg/dL)  GLUCOSE, CAPILLARY     Status: Abnormal   Collection Time   10/06/11  6:07 PM       Component Value Range   Glucose-Capillary 430 (*) 70 - 99 (mg/dL)  CBC     Status: Abnormal   Collection Time   10/06/11  6:30 PM      Component Value Range   WBC 13.0 (*) 4.0 - 10.5 (K/uL)   RBC 4.78  3.87 - 5.11 (MIL/uL)   Hemoglobin 13.9  12.0 - 15.0 (g/dL)   HCT 16.1  09.6 - 04.5 (%)   MCV 84.3  78.0 - 100.0 (fL)   MCH 29.1  26.0 - 34.0 (pg)   MCHC 34.5  30.0 - 36.0 (g/dL)   RDW 40.9  81.1 - 91.4 (%)   Platelets 305  150 - 400 (K/uL)  DIFFERENTIAL     Status: Abnormal   Collection Time   10/06/11  6:30 PM      Component Value Range   Neutrophils Relative 61  43 - 77 (%)   Neutro Abs 7.9 (*) 1.7 - 7.7 (K/uL)   Lymphocytes Relative 32  12 - 46 (%)   Lymphs Abs 4.1 (*) 0.7 - 4.0 (K/uL)   Monocytes Relative 7  3 - 12 (%)   Monocytes Absolute 0.8  0.1 - 1.0 (K/uL)   Eosinophils Relative 0  0 - 5 (%)   Eosinophils Absolute 0.0  0.0 - 0.7 (K/uL)   Basophils Relative 0  0 - 1 (%)   Basophils Absolute 0.0  0.0 - 0.1 (K/uL)  COMPREHENSIVE METABOLIC PANEL     Status: Abnormal   Collection Time   10/06/11  6:30 PM      Component Value Range   Sodium 125 (*) 135 - 145 (mEq/L)   Potassium 6.0 (*) 3.5 - 5.1 (mEq/L)   Chloride 89 (*) 96 - 112 (mEq/L)   CO2 26  19 - 32 (mEq/L)   Glucose, Bld 429 (*) 70 - 99 (mg/dL)   BUN 39 (*) 6 - 23 (mg/dL)   Creatinine, Ser 7.82 (*) 0.50 - 1.10 (mg/dL)   Calcium 95.6  8.4 - 10.5 (mg/dL)   Total Protein 8.2  6.0 - 8.3 (g/dL)   Albumin 3.7  3.5 - 5.2 (g/dL)   AST 13  0 - 37 (U/L)   ALT 17  0 - 35 (U/L)   Alkaline Phosphatase 115  39 - 117 (U/L)   Total Bilirubin 0.2 (*) 0.3 - 1.2 (mg/dL)   GFR calc non Af Amer 36 (*) >90 (mL/min)  GFR calc Af Amer 41 (*) >90 (mL/min)  GLUCOSE, CAPILLARY     Status: Abnormal   Collection Time   10/06/11  7:51 PM      Component Value Range   Glucose-Capillary 417 (*) 70 - 99 (mg/dL)  GLUCOSE, CAPILLARY     Status: Abnormal   Collection Time   10/06/11  9:05 PM      Component Value Range   Glucose-Capillary 416  (*) 70 - 99 (mg/dL)  BASIC METABOLIC PANEL     Status: Abnormal   Collection Time   10/06/11  9:58 PM      Component Value Range   Sodium 128 (*) 135 - 145 (mEq/L)   Potassium 4.9  3.5 - 5.1 (mEq/L)   Chloride 94 (*) 96 - 112 (mEq/L)   CO2 26  19 - 32 (mEq/L)   Glucose, Bld 371 (*) 70 - 99 (mg/dL)   BUN 39 (*) 6 - 23 (mg/dL)   Creatinine, Ser 6.21 (*) 0.50 - 1.10 (mg/dL)   Calcium 8.5  8.4 - 30.8 (mg/dL)   GFR calc non Af Amer 36 (*) >90 (mL/min)   GFR calc Af Amer 41 (*) >90 (mL/min)  GLUCOSE, CAPILLARY     Status: Abnormal   Collection Time   10/06/11 10:12 PM      Component Value Range   Glucose-Capillary 351 (*) 70 - 99 (mg/dL)  URINALYSIS, ROUTINE W REFLEX MICROSCOPIC     Status: Abnormal   Collection Time   10/06/11 10:30 PM      Component Value Range   Color, Urine YELLOW  YELLOW    APPearance CLEAR  CLEAR    Specific Gravity, Urine 1.023  1.005 - 1.030    pH 5.0  5.0 - 8.0    Glucose, UA >1000 (*) NEGATIVE (mg/dL)   Hgb urine dipstick NEGATIVE  NEGATIVE    Bilirubin Urine NEGATIVE  NEGATIVE    Ketones, ur NEGATIVE  NEGATIVE (mg/dL)   Protein, ur 30 (*) NEGATIVE (mg/dL)   Urobilinogen, UA 0.2  0.0 - 1.0 (mg/dL)   Nitrite NEGATIVE  NEGATIVE    Leukocytes, UA NEGATIVE  NEGATIVE   URINE RAPID DRUG SCREEN (HOSP PERFORMED)     Status: Normal   Collection Time   10/06/11 10:30 PM      Component Value Range   Opiates NONE DETECTED  NONE DETECTED    Cocaine NONE DETECTED  NONE DETECTED    Benzodiazepines NONE DETECTED  NONE DETECTED    Amphetamines NONE DETECTED  NONE DETECTED    Tetrahydrocannabinol NONE DETECTED  NONE DETECTED    Barbiturates NONE DETECTED  NONE DETECTED   URINE MICROSCOPIC-ADD ON     Status: Abnormal   Collection Time   10/06/11 10:30 PM      Component Value Range   Squamous Epithelial / LPF MANY (*) RARE    WBC, UA 3-6  <3 (WBC/hpf)   RBC / HPF 0-2  <3 (RBC/hpf)   Bacteria, UA FEW (*) RARE    Casts HYALINE CASTS (*) NEGATIVE    Crystals CA OXALATE  CRYSTALS (*) NEGATIVE    Urine-Other LESS THAN 10 mL OF URINE SUBMITTED    POCT I-STAT 3, BLOOD GAS (G3+)     Status: Abnormal   Collection Time   10/06/11 10:46 PM      Component Value Range   pH, Arterial 7.227 (*) 7.350 - 7.400    pCO2 arterial 65.8 (*) 35.0 - 45.0 (mmHg)   pO2, Arterial 35.0 (*)  80.0 - 100.0 (mmHg)   Bicarbonate 27.4 (*) 20.0 - 24.0 (mEq/L)   TCO2 29  0 - 100 (mmol/L)   O2 Saturation 54.0     Acid-base deficit 2.0  0.0 - 2.0 (mmol/L)   Collection site RADIAL, ALLEN'S TEST ACCEPTABLE     Drawn by Operator     Sample type ARTERIAL     Comment MD NOTIFIED, REPEAT TEST    GLUCOSE, CAPILLARY     Status: Abnormal   Collection Time   10/06/11 11:27 PM      Component Value Range   Glucose-Capillary 322 (*) 70 - 99 (mg/dL)  CARDIAC PANEL(CRET KIN+CKTOT+MB+TROPI)     Status: Abnormal   Collection Time   10/07/11 12:10 AM      Component Value Range   Total CK 101  7 - 177 (U/L)   CK, MB 3.0  0.3 - 4.0 (ng/mL)   Troponin I <0.30  <0.30 (ng/mL)   Relative Index 3.0 (*) 0.0 - 2.5   GLUCOSE, CAPILLARY     Status: Abnormal   Collection Time   10/07/11 12:35 AM      Component Value Range   Glucose-Capillary 265 (*) 70 - 99 (mg/dL)  OSMOLALITY, URINE     Status: Abnormal   Collection Time   10/07/11  1:18 AM      Component Value Range   Osmolality, Ur 331 (*) 390 - 1090 (mOsm/kg)  SODIUM, URINE, RANDOM     Status: Normal   Collection Time   10/07/11  1:18 AM      Component Value Range   Sodium, Ur 30    CREATININE, URINE, RANDOM     Status: Normal   Collection Time   10/07/11  1:18 AM      Component Value Range   Creatinine, Urine 76.89    GLUCOSE, CAPILLARY     Status: Abnormal   Collection Time   10/07/11  1:54 AM      Component Value Range   Glucose-Capillary 161 (*) 70 - 99 (mg/dL)  POCT I-STAT 3, BLOOD GAS (G3+)     Status: Abnormal   Collection Time   10/07/11  1:57 AM      Component Value Range   pH, Arterial 7.232 (*) 7.350 - 7.400    pCO2 arterial 67.7  (*) 35.0 - 45.0 (mmHg)   pO2, Arterial 62.0 (*) 80.0 - 100.0 (mmHg)   Bicarbonate 28.5 (*) 20.0 - 24.0 (mEq/L)   TCO2 31  0 - 100 (mmol/L)   O2 Saturation 86.0     Collection site RADIAL, ALLEN'S TEST ACCEPTABLE     Drawn by RT     Sample type ARTERIAL     Comment NOTIFIED PHYSICIAN    GLUCOSE, CAPILLARY     Status: Abnormal   Collection Time   10/07/11  2:59 AM      Component Value Range   Glucose-Capillary 113 (*) 70 - 99 (mg/dL)  POCT I-STAT 3, BLOOD GAS (G3+)     Status: Abnormal   Collection Time   10/07/11  3:58 AM      Component Value Range   pH, Arterial 7.249 (*) 7.350 - 7.400    pCO2 arterial 67.0 (*) 35.0 - 45.0 (mmHg)   pO2, Arterial 204.0 (*) 80.0 - 100.0 (mmHg)   Bicarbonate 29.3 (*) 20.0 - 24.0 (mEq/L)   TCO2 31  0 - 100 (mmol/L)   O2 Saturation 100.0     Acid-Base Excess 1.0  0.0 - 2.0 (mmol/L)  Collection site RADIAL, ALLEN'S TEST ACCEPTABLE     Drawn by Operator     Sample type ARTERIAL     Comment NOTIFIED PHYSICIAN    GLUCOSE, CAPILLARY     Status: Abnormal   Collection Time   10/07/11  4:05 AM      Component Value Range   Glucose-Capillary 110 (*) 70 - 99 (mg/dL)   Comment 1 Notify RN       Assessment/Plan Altered Mental Status:  obtundation --Even though her urine toxicology screen was negative for opiates, she did respond to Narcan and is reportedly on Lortab as an outpatient. This certainly could have been related to accidental overdose. We will observe the patient in the intensive care unit and have additional doses of Narcan available as needed.  Hypercarbia --If the patient remains awake she may not need the BiPAP. We will have it available and will recheck a blood gas in an hour.  Diabetes Mellitus Type 2 --Continue glucostabilizer   LOS: 1 day   Best practices / Disposition: -->ICU status under PCCM -->Full Code -->Heparin for DVT Px -->diet -->family updated at bedside  Lavella Hammock, M.D. Pulmonary and Critical Care Medicine Call  E-link with questions [phone]  10/07/2011

## 2011-10-07 NOTE — ED Notes (Signed)
Per Clydie Braun on 3100 the nurse taking this pt is not available and as soon as he is available he will return the call.

## 2011-10-07 NOTE — Progress Notes (Signed)
Pt profile: Brought to ED by mother for intractable hyperglycemia (a recurrent problem). Was noted to be lethargic (resolved with Narcan) and hypopneic. Started on BiPAP in ED. Adm to ICU on BiPAP and insulin gtt by PCCM    Subj: Off BiPAP. No distress. Oriented  Obj: Filed Vitals:   10/07/11 1300  BP: 122/48  Pulse: 75  Temp:   Resp: 15    Gen: chronically ill appearing. No distress HEENT:  WNL Neck: JVP not visualized Chest: Clear anteriorly Cardiac: RRR s M Abd: Soft, NT, NABS Ext: R BKA. Multiple toe amputations on L  BMET    Component Value Date/Time   NA 135 10/07/2011 0525   K 4.2 10/07/2011 0525   CL 99 10/07/2011 0525   CO2 26 10/07/2011 0525   GLUCOSE 144* 10/07/2011 0525   BUN 32* 10/07/2011 0525   CREATININE 1.11* 10/07/2011 0525   CALCIUM 8.7 10/07/2011 0525   GFRNONAA 57* 10/07/2011 0525   GFRAA 66* 10/07/2011 0525    CBC    Component Value Date/Time   WBC 7.9 10/07/2011 0525   RBC 4.20 10/07/2011 0525   HGB 12.1 10/07/2011 0525   HCT 35.7* 10/07/2011 0525   PLT 182 10/07/2011 0525   MCV 85.0 10/07/2011 0525   MCH 28.8 10/07/2011 0525   MCHC 33.9 10/07/2011 0525   RDW 14.0 10/07/2011 0525   LYMPHSABS 4.1* 10/06/2011 1830   MONOABS 0.8 10/06/2011 1830   EOSABS 0.0 10/06/2011 1830   BASOSABS 0.0 10/06/2011 1830    CXR: no new film   IMPRESSION: 1) AMS - appears resolved 2) DMII with poor control/severe insulin resistance  PLAN/RECS:  D/C BiPAP. D/C insulin gtt and transition to Lantus, SSI. Calorie restricted Low Carb diet ordered  Billy Fischer, MD;  PCCM service; Mobile [phone]

## 2011-10-08 ENCOUNTER — Inpatient Hospital Stay (HOSPITAL_COMMUNITY): Payer: Medicaid Other

## 2011-10-08 LAB — COMPREHENSIVE METABOLIC PANEL
Albumin: 2.7 g/dL — ABNORMAL LOW (ref 3.5–5.2)
Alkaline Phosphatase: 93 U/L (ref 39–117)
BUN: 20 mg/dL (ref 6–23)
CO2: 30 mEq/L (ref 19–32)
Chloride: 102 mEq/L (ref 96–112)
GFR calc non Af Amer: >90 mL/min (ref >90)
Glucose, Bld: 293 mg/dL — ABNORMAL HIGH (ref 70–99)
Potassium: 5 mEq/L (ref 3.5–5.1)
Total Bilirubin: 0.1 mg/dL — ABNORMAL LOW (ref 0.3–1.2)

## 2011-10-08 LAB — CBC
HCT: 34.3 % — ABNORMAL LOW (ref 36.0–46.0)
Hemoglobin: 11.3 g/dL — ABNORMAL LOW (ref 12.0–15.0)
MCV: 86.8 fL (ref 78.0–100.0)
RBC: 3.95 MIL/uL (ref 3.87–5.11)
RDW: 13.7 % (ref 11.5–15.5)
WBC: 5.6 10*3/uL (ref 4.0–10.5)

## 2011-10-08 LAB — URINE CULTURE: Culture: NO GROWTH

## 2011-10-08 LAB — GLUCOSE, CAPILLARY: Glucose-Capillary: 94 mg/dL (ref 70–99)

## 2011-10-08 NOTE — Progress Notes (Addendum)
Pt profile: Brought to ED by mother for intractable hyperglycemia (a recurrent problem). Was noted to be lethargic (resolved with Narcan) and hypopneic. Started on BiPAP in ED. Adm to ICU on BiPAP and insulin gtt by PCCM    Subj: No distress. No new complaints  Obj: Filed Vitals:   10/08/11 0800  BP: 164/67  Pulse: 65  Temp: 98 F (36.7 C)  Resp: 14    Gen: chronically ill appearing. No distress HEENT:  WNL Neck: JVP not visualized Chest: Clear anteriorly Cardiac: RRR s M Abd: Soft, NT, NABS Ext: R BKA. Multiple toe amputations on L  BMET    Component Value Date/Time   NA 137 10/08/2011 0535   K 5.0 10/08/2011 0535   CL 102 10/08/2011 0535   CO2 30 10/08/2011 0535   GLUCOSE 293* 10/08/2011 0535   BUN 20 10/08/2011 0535   CREATININE 0.79 10/08/2011 0535   CALCIUM 9.6 10/08/2011 0535   GFRNONAA >90 10/08/2011 0535   GFRAA >90 10/08/2011 0535    CBC    Component Value Date/Time   WBC 5.6 10/08/2011 0535   RBC 3.95 10/08/2011 0535   HGB 11.3* 10/08/2011 0535   HCT 34.3* 10/08/2011 0535   PLT 168 10/08/2011 0535   MCV 86.8 10/08/2011 0535   MCH 28.6 10/08/2011 0535   MCHC 32.9 10/08/2011 0535   RDW 13.7 10/08/2011 0535   LYMPHSABS 4.1* 10/06/2011 1830   MONOABS 0.8 10/06/2011 1830   EOSABS 0.0 10/06/2011 1830   BASOSABS 0.0 10/06/2011 1830    CXR: no new film   IMPRESSION: 1) AMS - resolved 2) DMII with poor control/severe insulin resistance  PLAN/RECS:  Transfer out of ICU Cont to work on glycemic control Needs further diabetic counseling. I think she should be on a very low CHO diet Discussed with Dr Butler Denmark. TRH to assume care 2/18 AM. PCCM will sign off as of AM 2/18. Please call if we can be of further assistance    Billy Fischer, MD;  PCCM service; Mobile [phone]

## 2011-10-09 LAB — GLUCOSE, CAPILLARY
Glucose-Capillary: 197 mg/dL — ABNORMAL HIGH (ref 70–99)
Glucose-Capillary: 266 mg/dL — ABNORMAL HIGH (ref 70–99)

## 2011-10-09 MED ORDER — FUROSEMIDE 20 MG PO TABS: 20.0000 mg | ORAL_TABLET | Freq: Every day | ORAL | Status: DC

## 2011-10-09 MED ORDER — INSULIN ASPART 100 UNIT/ML ~~LOC~~ SOLN: 13.0000 [IU] | Freq: Three times a day (TID) | SUBCUTANEOUS | Status: DC

## 2011-10-09 MED ORDER — HYDROCODONE-ACETAMINOPHEN 10-500 MG PO TABS: 0.5000 | ORAL_TABLET | Freq: Two times a day (BID) | ORAL | Status: DC | PRN

## 2011-10-09 MED ORDER — TRAMADOL HCL ER 100 MG PO TB24: 100.0000 mg | ORAL_TABLET | Freq: Every day | ORAL | Status: DC | PRN

## 2011-10-09 MED ORDER — ASPIRIN 81 MG PO TBEC: 81.0000 mg | DELAYED_RELEASE_TABLET | Freq: Every day | ORAL | Status: AC

## 2011-10-09 MED ORDER — LISINOPRIL 5 MG PO TABS
5.0000 mg | ORAL_TABLET | Freq: Every day | ORAL | Status: DC
  Filled 2011-10-09: qty 1

## 2011-10-09 MED ORDER — LIVING WELL WITH DIABETES BOOK
Freq: Once | Status: DC
  Filled 2011-10-09: qty 1

## 2011-10-09 NOTE — Progress Notes (Signed)
Patient refused home health case management per Dr. Sunnie Nielsen, B order. Patient also changed her mind about recieveing a new primary care physician. Dr. Sunnie Nielsen was notified about these issues.

## 2011-10-09 NOTE — Progress Notes (Signed)
Inpatient Diabetes Program Recommendations  AACE/ADA: New Consensus Statement on Inpatient Glycemic Control (2009)  Target Ranges:  Prepandial:   less than 140 mg/dL      Peak postprandial:   less than 180 mg/dL (1-2 hours)      Critically ill patients:  140 - 180 mg/dL   Reason: Consult for education   Inpatient Diabetes Program Recommendations Outpatient Referral: Recommend outpatient education and follow-up for A1C=12.7  [facility]  Note: RN will have patient watch the 'Diabetes Basics' video on the patient education network prior to discharge.  Diabetes education workbook 'Living Well with Diabetes' has been ordered from the pharmacy floorstock.  Patient reports having diabetes for 17 years.  No further recommendations at this time.  Thank you Piedad Climes RN,BSN,CDE Inpatient Diabetes Coordinator
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
## 2011-10-09 NOTE — Progress Notes (Signed)
Physical Therapy Evaluation Patient Details Name: [name] MRN: [id number] DOB: [birth year] Today's Date: 10/09/2011  Problem List:  Patient Active Problem List  Diagnoses  . CANDIDIASIS OF VULVA AND VAGINA  . DIABETES MELLITUS, TYPE II  . TOBACCO ABUSE  . DEPRESSION  . PERIPHERAL NEUROPATHY  . CALLUS, RIGHT FOOT  . SKIN LESION  . HEEL SPUR  . OSTEOMYELITIS  . LEG EDEMA  . URINARY URGENCY  . ANA POSITIVE  . SHOULDER INJURY, LEFT  . LOWER LIMB AMPUTATION, GREAT TOE  . HYPERCHOLESTEROLEMIA  . DIABETIC FOOT ULCER, TOE  . Hyperglycemia  . Encephalopathy  . Hyponatremia  . Hyperkalemia  . Acute renal failure    Past Medical History:  Past Medical History  Diagnosis Date  . Diabetes mellitus   . Hypertension   . Diabetic neuropathy   . Cellulitis   . Thyroid disease   . Hyperlipidemia   . Heart murmur   . Hypothyroidism   . Arthritis   . Pneumonia   . Depression    Past Surgical History:  Past Surgical History  Procedure Date  . Amputation   . Leg amputation     right leg amputation below knee  . Toe amputation     left foot only 2 toes remaning.   . Tubal ligation     PT Assessment/Plan/Recommendation PT Assessment Clinical Impression Statement:  51 yo female admitted with hyperglycemia with comorbidities of RBKA and Multiple L toe amputations presents with decr functional mobility, in particular difficulty using stairs at home;  While pt is dcing from hospital today, and has declined stair training here, pt would benefit from HHPT to help problem-solve managing architectural barriers in the home;  Requesting HHPT follow-up (still, pt may decline) PT Recommendation/Assessment: All further PT needs can be met in the next venue of care PT Problem List: Decreased balance;Decreased mobility;Decreased knowledge of use of DME PT Therapy Diagnosis : Difficulty walking PT Recommendation Follow Up Recommendations: Home health PT;Supervision -  Intermittent Equipment Recommended: None recommended by PT     PT Evaluation Precautions/Restrictions  Precautions Required Braces or Orthoses:  (Postop shoe in room) Restrictions Weight Bearing Restrictions:  (Be careful of BKA residual limb) Prior Functioning  Home Living Lives With: Family Receives Help From: Family;Friend(s) Type of Home: Apartment Home Layout: Two level Alternate Level Stairs-Rails: Right;Left Alternate Level Stairs-Number of Steps: 10 (pt reports she crawls up the stairs) Home Access: Stairs to enter Entrance Stairs-Rails:  (Not exactly sure) Entrance Stairs-Number of Steps: 4 (Pt reports "a few") Home Adaptive Equipment: Walker - rolling;Bedside commode/3-in-1;Hospital bed (Wheelchair) Prior Function Level of Independence: Independent with basic ADLs;Needs assistance with homemaking (Family and friends help with emptying commode) Able to Take Stairs?:  (per pt, she crawls) Driving: Yes Cognition Cognition Arousal/Alertness: Awake/alert Overall Cognitive Status: Appears within functional limits for tasks assessed Orientation Level: Oriented X4 Sensation/Coordination Sensation Light Touch: Not tested Coordination Gross Motor Movements are Fluid and Coordinated: Yes Fine Motor Movements are Fluid and Coordinated: Yes Extremity Assessment RUE Assessment RUE Assessment: Within Functional Limits LUE Assessment LUE Assessment: Within Functional Limits RLE Assessment RLE Assessment:  (BKA) LLE Assessment LLE Assessment: Within Functional Limits Mobility (including Balance) Bed Mobility Bed Mobility: No Transfers Transfers: Yes Stand Pivot Transfers: 5: Supervision Stand Pivot Transfer Details (indicate cue type and reason): stand pivot on LLE bed to commode and back again; she used bedside table for UE support, which, while it is a suboptimal choice for steadiness, and it rolled, the pt  still had enough balance and coordination to compensate and  transfer safely Ambulation/Gait Ambulation/Gait: No Stairs: No (Pt politely declined)       End of Session PT - End of Session Activity Tolerance: Patient tolerated treatment well Patient left: in bed;with family/visitor present (Sitting EOB) Nurse Communication: Mobility status for transfers;Other (comment) (DC planning; rec for [facility]) General Behavior During Session: [facility] for tasks performed Cognition: [facility] for tasks performed  Van Clines [facility] [place], [place] [phone]  10/09/2011, 3:37 PM

## 2011-11-09 ENCOUNTER — Ambulatory Visit: Payer: Medicaid Other | Admitting: Dietician

## 2011-11-15 ENCOUNTER — Other Ambulatory Visit (HOSPITAL_COMMUNITY): Payer: Self-pay | Admitting: Plastic Surgery

## 2011-11-15 ENCOUNTER — Ambulatory Visit (HOSPITAL_COMMUNITY)
Admission: RE | Admit: 2011-11-15 | Discharge: 2011-11-15 | Disposition: A | Discharge status: Home / Self Care | Payer: Medicaid Other | Source: Ambulatory Visit | Attending: Plastic Surgery | Admitting: Plastic Surgery

## 2011-11-15 ENCOUNTER — Encounter (HOSPITAL_BASED_OUTPATIENT_CLINIC_OR_DEPARTMENT_OTHER): Payer: Medicaid Other | Attending: Plastic Surgery

## 2011-11-15 DIAGNOSIS — R0789 Other chest pain: Secondary | ICD-10-CM | POA: Insufficient documentation

## 2011-11-15 DIAGNOSIS — E1169 Type 2 diabetes mellitus with other specified complication: Secondary | ICD-10-CM | POA: Insufficient documentation

## 2011-11-15 DIAGNOSIS — S88119A Complete traumatic amputation at level between knee and ankle, unspecified lower leg, initial encounter: Secondary | ICD-10-CM | POA: Insufficient documentation

## 2011-11-15 DIAGNOSIS — M47817 Spondylosis without myelopathy or radiculopathy, lumbosacral region: Secondary | ICD-10-CM | POA: Insufficient documentation

## 2011-11-15 DIAGNOSIS — R52 Pain, unspecified: Secondary | ICD-10-CM

## 2011-11-15 DIAGNOSIS — M47814 Spondylosis without myelopathy or radiculopathy, thoracic region: Secondary | ICD-10-CM | POA: Insufficient documentation

## 2011-11-15 DIAGNOSIS — L97809 Non-pressure chronic ulcer of other part of unspecified lower leg with unspecified severity: Secondary | ICD-10-CM | POA: Insufficient documentation

## 2011-11-15 NOTE — Progress Notes (Signed)
[facility]  NAME:  [name], [name]               ACCOUNT NO.:  [id number]  MEDICAL RECORD NO.:  [id number]      DATE OF BIRTH:  1961/06/29  PHYSICIAN:  Wayland Denis, DO            VISIT DATE:                                  OFFICE VISIT   CHIEF COMPLAINT:  Right lower extremity stump diabetic ulcer.  HISTORY OF PRESENT ILLNESS:  The patient is a 51 year old white female, here for evaluation of a right stump wound.  She underwent amputation in the last few months secondary to wounds that were not healing.  She had been doing well with the wound healed until 1 day she said she woke up a few weeks ago and the area was red and warm to touch, and it looked infected.  She went to her physician and was started on doxycycline and clindamycin.  She noticed a marked improvement since that time.  She has multiple medical problems.  Local care has just been a dry dressing to date.  PAST MEDICAL HISTORY:  Her past medical history is positive for severe hyperlipidemia, diabetes, hypertension, peripheral neuropathy, history of osteomyelitis of her foot ulcers that were amputated, anxiety and depression, thyroid disease.  SURGERIES:  Right lower extremity below-knee amputation and toe amputation of the left foot x3.  ALLERGIES:  IV DYE and PENICILLIN.  MEDICATIONS:  Gabapentin, Cymbalta, oxybutynin, Lovaza, levothyroxine, gemfibrozil, tramadol, metoclopramide, Levemir, NovoLog, Novolin, hydrocodone, furosemide, aspirin, clindamycin, doxycycline.  REVIEW OF SYSTEMS:  Complains of pain in her right leg, but denies blood in her urine or stool, difficulty breathing, chest pain, or significant change in weight.  SOCIAL HISTORY:  She lives at home.  She does smoke.  LABORATORY DATA:  Her labs show a hemoglobin A1c at 13.8.  Her cholesterol is elevated at 413, triglycerides 1392, HDL 31.  PHYSICAL EXAMINATION:  She is alert, oriented, cooperative.  She is not in any  acute distress.  She is pleasant for the exam.  Pupils are equal. Extraocular muscles are intact.  No cervical lymphadenopathy.  Her breathing is unlabored.  Her heart is regular.  Her abdomen is large, but soft.  No guarding or tenderness.  She has a boot on her left lower extremity.  On the right, she has a wound noted in the notes for size and is approximately 1.5 cm x 1.7 cm. There does not appear to be bone exposed.  The area is a little bit red in the periwound area, but no gross drainage.  Recommend checking labs including a pre-albumin, a sed rate, and a CBC. Chest x-ray and x-ray of the stump to be sure there is no more involved. I strongly recommend stopping all tobacco, controlling blood sugar, decreasing cholesterol, offload so hold off on a prosthetic and Santyl dressing.  I will see her back in 1 week.     Wayland Denis, DO     CS/MEDQ  D:  11/15/2011  T:  11/15/2011  Job:  [id number]

## 2011-11-22 ENCOUNTER — Encounter (HOSPITAL_BASED_OUTPATIENT_CLINIC_OR_DEPARTMENT_OTHER): Payer: Medicaid Other | Attending: Plastic Surgery

## 2011-11-22 DIAGNOSIS — L97809 Non-pressure chronic ulcer of other part of unspecified lower leg with unspecified severity: Secondary | ICD-10-CM | POA: Insufficient documentation

## 2011-11-22 DIAGNOSIS — M7989 Other specified soft tissue disorders: Secondary | ICD-10-CM | POA: Insufficient documentation
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
## 2011-12-11 ENCOUNTER — Emergency Department (HOSPITAL_COMMUNITY)
Admission: EM | Admit: 2011-12-11 | Discharge: 2011-12-11 | Disposition: A | Discharge status: Home / Self Care | Payer: Medicaid Other | Attending: Emergency Medicine | Admitting: Emergency Medicine

## 2011-12-11 ENCOUNTER — Encounter (HOSPITAL_COMMUNITY): Payer: Self-pay | Admitting: *Deleted

## 2011-12-11 ENCOUNTER — Emergency Department (HOSPITAL_COMMUNITY): Payer: Medicaid Other

## 2011-12-11 DIAGNOSIS — F329 Major depressive disorder, single episode, unspecified: Secondary | ICD-10-CM | POA: Insufficient documentation

## 2011-12-11 DIAGNOSIS — E079 Disorder of thyroid, unspecified: Secondary | ICD-10-CM | POA: Insufficient documentation

## 2011-12-11 DIAGNOSIS — R05 Cough: Secondary | ICD-10-CM | POA: Insufficient documentation

## 2011-12-11 DIAGNOSIS — I10 Essential (primary) hypertension: Secondary | ICD-10-CM | POA: Insufficient documentation

## 2011-12-11 DIAGNOSIS — E785 Hyperlipidemia, unspecified: Secondary | ICD-10-CM | POA: Insufficient documentation

## 2011-12-11 DIAGNOSIS — B373 Candidiasis of vulva and vagina: Secondary | ICD-10-CM

## 2011-12-11 DIAGNOSIS — F3289 Other specified depressive episodes: Secondary | ICD-10-CM | POA: Insufficient documentation

## 2011-12-11 DIAGNOSIS — S88119A Complete traumatic amputation at level between knee and ankle, unspecified lower leg, initial encounter: Secondary | ICD-10-CM | POA: Insufficient documentation

## 2011-12-11 DIAGNOSIS — R059 Cough, unspecified: Secondary | ICD-10-CM | POA: Insufficient documentation

## 2011-12-11 DIAGNOSIS — F17200 Nicotine dependence, unspecified, uncomplicated: Secondary | ICD-10-CM | POA: Insufficient documentation

## 2011-12-11 DIAGNOSIS — B3731 Acute candidiasis of vulva and vagina: Secondary | ICD-10-CM | POA: Insufficient documentation

## 2011-12-11 DIAGNOSIS — Z794 Long term (current) use of insulin: Secondary | ICD-10-CM | POA: Insufficient documentation

## 2011-12-11 DIAGNOSIS — R739 Hyperglycemia, unspecified: Secondary | ICD-10-CM

## 2011-12-11 DIAGNOSIS — N390 Urinary tract infection, site not specified: Secondary | ICD-10-CM

## 2011-12-11 DIAGNOSIS — Z8701 Personal history of pneumonia (recurrent): Secondary | ICD-10-CM | POA: Insufficient documentation

## 2011-12-11 DIAGNOSIS — E119 Type 2 diabetes mellitus without complications: Secondary | ICD-10-CM | POA: Insufficient documentation

## 2011-12-11 LAB — BASIC METABOLIC PANEL
BUN: 26 mg/dL — ABNORMAL HIGH (ref 6–23)
CO2: 29 mEq/L (ref 19–32)
Chloride: 94 mEq/L — ABNORMAL LOW (ref 96–112)
Creatinine, Ser: 0.85 mg/dL (ref 0.50–1.10)

## 2011-12-11 LAB — URINE MICROSCOPIC-ADD ON

## 2011-12-11 LAB — POCT I-STAT 3, VENOUS BLOOD GAS (G3P V)
Acid-Base Excess: 4 mmol/L — ABNORMAL HIGH (ref 0.0–2.0)
Bicarbonate: 32.7 mEq/L — ABNORMAL HIGH (ref 20.0–24.0)
O2 Saturation: 36 %

## 2011-12-11 LAB — CBC
HCT: 39 % (ref 36.0–46.0)
MCV: 85 fL (ref 78.0–100.0)
RBC: 4.59 MIL/uL (ref 3.87–5.11)
WBC: 8.4 10*3/uL (ref 4.0–10.5)

## 2011-12-11 LAB — WET PREP, GENITAL: Clue Cells Wet Prep HPF POC: NONE SEEN

## 2011-12-11 LAB — URINALYSIS, ROUTINE W REFLEX MICROSCOPIC
Bilirubin Urine: NEGATIVE
Glucose, UA: >1000 mg/dL — AB
Ketones, ur: NEGATIVE mg/dL
Leukocytes, UA: NEGATIVE
pH: 5.5 (ref 5.0–8.0)

## 2011-12-11 LAB — GLUCOSE, CAPILLARY: Glucose-Capillary: 264 mg/dL — ABNORMAL HIGH (ref 70–99)

## 2011-12-11 MED ORDER — METRONIDAZOLE 500 MG PO TABS: 500.0000 mg | ORAL_TABLET | Freq: Two times a day (BID) | ORAL | Status: AC

## 2011-12-11 MED ORDER — INSULIN ASPART 100 UNIT/ML ~~LOC~~ SOLN
9.0000 [IU] | Freq: Once | SUBCUTANEOUS | Status: AC
  Administered 2011-12-11: 9 [IU] via INTRAVENOUS
  Filled 2011-12-11: qty 1

## 2011-12-11 MED ORDER — SULFAMETHOXAZOLE-TRIMETHOPRIM 800-160 MG PO TABS: 1.0000 | ORAL_TABLET | Freq: Two times a day (BID) | ORAL | Status: AC

## 2011-12-11 MED ORDER — SODIUM CHLORIDE 0.9 % IV BOLUS (SEPSIS)
500.0000 mL | Freq: Once | INTRAVENOUS | Status: AC
  Administered 2011-12-11: 500 mL via INTRAVENOUS

## 2011-12-11 MED ORDER — SODIUM CHLORIDE 0.9 % IV BOLUS (SEPSIS)
1000.0000 mL | Freq: Once | INTRAVENOUS | Status: AC
  Administered 2011-12-11: 1000 mL via INTRAVENOUS

## 2011-12-11 NOTE — ED Notes (Signed)
Patient reports she thinks she has a yeast infection with vaginal itching, rash, and discharge.  Patient states she also has elevated sugar today over 300.  She has had headache and sleepiness for 1 week.  She states she has been taking her meds as directed.  Patient has had problems controlling her sugar for over 1 mth.

## 2011-12-11 NOTE — ED Provider Notes (Signed)
History     CSN: [id number]  Arrival date & time 12/11/11  1348   First MD Initiated Contact with Patient 12/11/11 1508      Chief Complaint  Patient presents with  . Hyperglycemia  . Rash    (Consider location/radiation/quality/duration/timing/severity/associated sxs/prior treatment) The history is provided by the patient.  51 y/o F with PMH of HTN and poorly controlled T2DM s/p right BKA presents to ED with c/c of hyperglycemia and vaginal itching. Blood sugar has reportedly been difficult to control for several months, with ICU hospital admission as recently as February for altered mental status (secondary to both hyperglycemia and narcotic use). Pt reports compliance with diabetic medications that include Levemir QHS and Novolog TID but has had no readings below 300 on monitor in last week. Endorses excessive sleepiness for months, worse in the last week. There has been no head injury, but she has had intermittent mild HA over the last week that is currently absent. Chronic, unchanged cough pt attributes to smoking. Denies any fever or chills. Denies nausea, vomiting, abdominal pain, or change in bowel habits. 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 Has not been able to see her primary doctor regarding blood sugar, but has an appointment scheduled for 4/30. Also with vaginal itching assoc with discharge, odor, and burning for the last week. Suspects this is secondary to recent abx course (that she has now completed) for RLE wound. Called her doctor to get rx for "yeast medicine" but did not hear back.  Past Medical History  Diagnosis Date  . Diabetes mellitus   . Hypertension   . Diabetic neuropathy   . Cellulitis   . Thyroid disease   . Hyperlipidemia   . Heart murmur   . Hypothyroidism   . Arthritis   . Pneumonia   .  Depression     Past Surgical History  Procedure Date  . Amputation   . Leg amputation     right leg amputation below knee  . Toe amputation     left foot only 2 toes remaning.   . Tubal ligation     No family history on file.  History  Substance Use Topics  . Smoking status: Current Everyday Smoker -- 1.0 packs/day  . Smokeless tobacco: Not on file  . Alcohol Use: No     Review of Systems 10 systems reviewed and are negative for acute change except as noted in the HPI.  Allergies  Gadolinium; Ivp dye; Metformin; and Penicillins  Home Medications   Current Outpatient Rx  Name Route Sig Dispense Refill  . ASPIRIN 81 MG PO TBEC Oral Take 1 tablet (81 mg total) by mouth daily. 30 tablet 0  . DULOXETINE HCL 60 MG PO CPEP Oral Take 60 mg by mouth every morning.      . FUROSEMIDE 20 MG PO TABS Oral Take 20 mg by mouth daily.     Marland Kitchen GABAPENTIN 800 MG PO TABS Oral Take 800 mg by mouth 3 (three) times daily.     Marland Kitchen GEMFIBROZIL 600 MG PO TABS Oral Take 600 mg by mouth 2 (two) times daily before a meal.    . HYDROCODONE-ACETAMINOPHEN 10-500 MG PO TABS Oral Take 0.5 tablets by mouth 2 (two) times daily as needed. For pain 30 tablet 0  . HYDROCODONE-ACETAMINOPHEN 10-325 MG PO TABS  Oral Take 1 tablet by mouth 2 (two) times daily as needed.    . INSULIN ASPART 100 UNIT/ML [place] SOLN Subcutaneous Inject 15 Units into the skin 3 (three) times daily before meals. Inject 15 units at mealtimes and uses sliding scale as directed by MD    . INSULIN DETEMIR 100 UNIT/ML [place] SOLN Subcutaneous Inject 56 Units into the skin at bedtime.     Marland Kitchen LEVOTHYROXINE SODIUM 25 MCG PO TABS Oral Take 25 mcg by mouth every morning.     Marland Kitchen LISINOPRIL 5 MG PO TABS Oral Take 5 mg by mouth daily after breakfast.     . METOCLOPRAMIDE HCL 5 MG PO TABS Oral Take 5 mg by mouth 3 (three) times daily.     . OMEGA-3-ACID ETHYL ESTERS 1 G PO CAPS Oral Take 2 g by mouth 2 (two) times daily. Take 2 grams at 8am and 2 grams at 4pm.      . OXYBUTYNIN CHLORIDE 5 MG PO TABS Oral Take 5 mg by mouth every morning.      Marland Kitchen TRAMADOL HCL ER 100 MG PO TB24 Oral Take 100 mg by mouth daily.      BP 142/68  Pulse 80  Temp(Src) 98 F (36.7 C) (Oral)  Resp 16  SpO2 94%  LMP 11/08/2011  Physical Exam  Nursing note and vitals reviewed.  vital signs are normal. Patient is awake, alert, in no acute distress. Well-developed and well-nourished. Head is normocephalic and atraumatic. Nose is normal. Edentulous with upper dentures in place. Oral mucosa are moist. Pupils are equal, round, reactive to light. Conjunctiva are normal. Neck is supple without adenopathy. Heart with regular rate and rhythm, systolic murmur heard. Normal respiratory effort and excursion. Coughing during examination. Lungs are clear to auscultation bilaterally. There is no chest wall tenderness. Abdomen is soft, obese, nontender. Towel sounds are normal. There is no guarding. External genitalia with well demarcated erythema to labia majora. No discharge seen externally. Internal examination demonstrates clear/white vaginal discharge. Vaginal walls are pink and moist. Cervical os is closed. There is no cervical motion tenderness. Bilateral adnexa are without mass or tenderness. Uterus is nontender. Musculoskeletal exam demonstrates a right below-the-knee amputation. Left first second and third toes are surgically absent. Extremities are without edema or tenderness. Alert and oriented x3. Cranial nerves III through XII are tested and intact. Speech is clear. Bilateral grip strength is 5 out of 5. Finger to nose is intact bilaterally. Skin is warm and dry. Erythema with dry skin seen to bilateral dorsum of hands to the base of the first and second digits and the space between them. Left greater than right. Left with several small areas of dried blood. No induration, erythematous streaking, excess warmth. Wound care dressing is in place to be distal end of the right lower extremity-this  is not removed. Mood and affect are normal.    ED Course  Procedures (including critical care time)  Labs Reviewed  GLUCOSE, CAPILLARY - Abnormal; Notable for the following:    Glucose-Capillary 381 (*)    All other components within normal limits  BASIC METABOLIC PANEL - Abnormal; Notable for the following:    Sodium 134 (*)    Chloride 94 (*)    Glucose, Bld 379 (*)    BUN 26 (*)    GFR calc non Af Amer 79 (*)    All other components within normal limits  URINALYSIS, ROUTINE W REFLEX MICROSCOPIC - Abnormal; Notable for the following:    APPearance  HAZY (*)    Glucose, UA >1000 (*)    Hgb urine dipstick SMALL (*)    Protein, ur 30 (*)    Nitrite POSITIVE (*)    All other components within normal limits  WET PREP, GENITAL - Abnormal; Notable for the following:    Yeast Wet Prep HPF POC FEW (*)    WBC, Wet Prep HPF POC FEW (*)    All other components within normal limits  POCT I-STAT 3, BLOOD GAS (G3P V) - Abnormal; Notable for the following:    pH, Ven 7.305 (*)    pCO2, Ven 65.7 (*)    pO2, Ven 25.0 (*)    Bicarbonate 32.7 (*)    Acid-Base Excess 4.0 (*)    All other components within normal limits  URINE MICROSCOPIC-ADD ON - Abnormal; Notable for the following:    Bacteria, UA MANY (*)    All other components within normal limits  GLUCOSE, CAPILLARY - Abnormal; Notable for the following:    Glucose-Capillary 264 (*)    All other components within normal limits  GLUCOSE, CAPILLARY - Abnormal; Notable for the following:    Glucose-Capillary 188 (*)    All other components within normal limits  CBC  PREGNANCY, URINE  GC/CHLAMYDIA PROBE AMP, GENITAL   Dg Chest 2 View  12/11/2011  *RADIOLOGY REPORT*  Clinical Data: Cough, poorly controlled blood sugar.  CHEST - 2 VIEW  Comparison: 11/15/2011  Findings: The heart size and mediastinal contours are within normal limits.  Both lungs are clear.  The visualized skeletal structures are unremarkable.  IMPRESSION: Negative exam.   Original Report Authenticated By: Rosealee Albee, M.D.     Diagnoses 1: Hyperglycemia Diagnosis 2: Urinary tract infection Diagnosis 3: Vaginal candidiasis   MDM  Poorly controlled DM. Hyperglycemia tx in ED with IV NS, insulin bolus x 2 with good reduction in blood sugar. Anion gap 11 with normal CO2, not c/w DKA. No ketones in urine. Although PCO2 slightly high on VBG, pt is awake, alert, oriented on exam and repeat evaluations demonstrate no drowsiness. U/a c/w UTI. Wet prep with small amt of yeast. Will tx both UTI and candida. Wounds on bilateral hands most likely secondary to recent sun exposure while fishing while taking doxycycline, no signs of infection. Advised follow-up by calling PMD tomorrow to determine appropriate recommendations for adjustments in home insulin use, first available office appt. No leukocytosis, fever, or abnormal CXR findings to suggest cause of poorly controlled blood sugar. Patient feels comfortable with plan and agrees.        Shaaron Adler, [place] 12/11/11 2117

## 2011-12-11 NOTE — ED Notes (Signed)
Patient transported to X-ray 

## 2011-12-11 NOTE — Discharge Instructions (Signed)
As we discussed, you need to call your doctor tomorrow to discuss any necessary changes to your home insulin regimen to keep her blood sugar under better control.   Hyperglycemia Hyperglycemia occurs when the glucose (sugar) in your blood is too high. Hyperglycemia can happen for many reasons, but it most often happens to people who do not know they have diabetes or are not managing their diabetes properly.  CAUSES  Whether you have diabetes or not, there are other causes of hyperglycemia. Hyperglycemia can occur when you have diabetes, but it can also occur in other situations that you might not be as aware of, such as: Diabetes  If you have diabetes and are having problems controlling your blood glucose, hyperglycemia could occur because of some of the following reasons:   Not following your meal plan.   Not taking your diabetes medications or not taking it properly.   Exercising less or doing less activity than you normally do.   Being sick.  Pre-diabetes  This cannot be ignored. Before people develop Type 2 diabetes, they almost always have "pre-diabetes." This is when your blood glucose levels are higher than normal, but not yet high enough to be diagnosed as diabetes. Research has shown that some long-term damage to the body, especially the heart and circulatory system, may already be occurring during pre-diabetes. If you take action to manage your blood glucose when you have pre-diabetes, you may delay or prevent Type 2 diabetes from developing.  Stress  If you have diabetes, you may be "diet" controlled or on oral medications or insulin to control your diabetes. However, you may find that your blood glucose is higher than usual in the hospital whether you have diabetes or not. This is often referred to as "stress hyperglycemia." Stress can elevate your blood glucose. This happens because of hormones put out by the body during times of stress. If stress has been the cause of your high  blood glucose, it can be followed regularly by your caregiver. That way he/she can make sure your hyperglycemia does not continue to get worse or progress to diabetes.  Steroids  Steroids are medications that act on the infection fighting system (immune system) to block inflammation or infection. One side effect can be a rise in blood glucose. Most people can produce enough extra insulin to allow for this rise, but for those who cannot, steroids make blood glucose levels go even higher. It is not unusual for steroid treatments to "uncover" diabetes that is developing. It is not always possible to determine if the hyperglycemia will go away after the steroids are stopped. A special blood test called an A1c is sometimes done to determine if your blood glucose was elevated before the steroids were started.  SYMPTOMS  Thirsty.   Frequent urination.   Dry mouth.   Blurred vision.   Tired or fatigue.   Weakness.   Sleepy.   Tingling in feet or leg.  DIAGNOSIS  Diagnosis is made by monitoring blood glucose in one or all of the following ways:  A1c test. This is a chemical found in your blood.   Fingerstick blood glucose monitoring.   Laboratory results.  TREATMENT  First, knowing the cause of the hyperglycemia is important before the hyperglycemia can be treated. Treatment may include, but is not be limited to:  Education.   Change or adjustment in medications.   Change or adjustment in meal plan.   Treatment for an illness, infection, etc.   More frequent  blood glucose monitoring.   Change in exercise plan.   Decreasing or stopping steroids.   Lifestyle changes.  HOME CARE INSTRUCTIONS   Test your blood glucose as directed.   Exercise regularly. Your caregiver will give you instructions about exercise. Pre-diabetes or diabetes which comes on with stress is helped by exercising.   Eat wholesome, balanced meals. Eat often and at regular, fixed times. Your caregiver or  nutritionist will give you a meal plan to guide your sugar intake.   Being at an ideal weight is important. If needed, losing as little as 10 to 15 pounds may help improve blood glucose levels.  SEEK MEDICAL CARE IF:   You have questions about medicine, activity, or diet.   You continue to have symptoms (problems such as increased thirst, urination, or weight gain).  SEEK IMMEDIATE MEDICAL CARE IF:   You are vomiting or have diarrhea.   Your breath smells fruity.   You are breathing faster or slower.   You are very sleepy or incoherent.   You have numbness, tingling, or pain in your feet or hands.   You have chest pain.   Your symptoms get worse even though you have been following your caregiver's orders.   If you have any other questions or concerns.  Document Released: 01/31/2001 Document Revised: 07/27/2011 Document Reviewed: 03/29/2009 [facility] Patient Information 2012 [place], [place].         Urinary Tract Infection Infections of the urinary tract can start in several places. A bladder infection (cystitis), a kidney infection (pyelonephritis), and a prostate infection (prostatitis) are different types of urinary tract infections (UTIs). They usually get better if treated with medicines (antibiotics) that kill germs. Take all the medicine until it is gone. You or your child may feel better in a few days, but TAKE ALL MEDICINE or the infection may not respond and may become more difficult to treat. HOME CARE INSTRUCTIONS   Drink enough water and fluids to keep the urine clear or pale yellow. Cranberry juice is especially recommended, in addition to large amounts of water.   Avoid caffeine, tea, and carbonated beverages. They tend to irritate the bladder.   Alcohol may irritate the prostate.   Only take over-the-counter or prescription medicines for pain, discomfort, or fever as directed by your caregiver.  To prevent further infections:  Empty the bladder often.  Avoid holding urine for long periods of time.   After a bowel movement, women should cleanse from front to back. Use each tissue only once.   Empty the bladder before and after sexual intercourse.  FINDING OUT THE RESULTS OF YOUR TEST Not all test results are available during your visit. If your or your child's test results are not back during the visit, make an appointment with your caregiver to find out the results. Do not assume everything is normal if you have not heard from your caregiver or the medical facility. It is important for you to follow up on all test results. SEEK MEDICAL CARE IF:   There is back pain.   Your baby is older than 3 months with a rectal temperature of 100.5 F (38.1 C) or higher for more than 1 day.   Your or your child's problems (symptoms) are no better in 3 days. Return sooner if you or your child is getting worse.  SEEK IMMEDIATE MEDICAL CARE IF:   There is severe back pain or lower abdominal pain.   You or your child develops chills.   You  have a fever.   Your baby is older than 3 months with a rectal temperature of 102 F (38.9 C) or higher.   Your baby is 92 months old or younger with a rectal temperature of 100.4 F (38 C) or higher.   There is nausea or vomiting.   There is continued burning or discomfort with urination.  MAKE SURE YOU:   Understand these instructions.   Will watch your condition.   Will get help right away if you are not doing well or get worse.  Document Released: 05/17/2005 Document Revised: 07/27/2011 Document Reviewed: 12/20/2006  Patient Information 2012 [place], [place].        Candida Infection, Adult A candida infection (also called yeast, fungus and Monilia infection) is an overgrowth of yeast that can occur anywhere on the body. A yeast infection commonly occurs in warm, moist body areas. Usually, the infection remains localized but can spread to become a systemic infection. A yeast infection may  be a sign of a more severe disease such as diabetes, leukemia, or AIDS. A yeast infection can occur in both men and women. In women, Candida vaginitis is a vaginal infection. It is one of the most common causes of vaginitis. Men usually do not have symptoms or know they have an infection until other problems develop. Men may find out they have a yeast infection because their sex partner has a yeast infection. Uncircumcised men are more likely to get a yeast infection than circumcised men. This is because the uncircumcised glans is not exposed to air and does not remain as dry as that of a circumcised glans. Older adults may develop yeast infections around dentures. CAUSES  Women  Antibiotics.   Steroid medication taken for a long time.   Being overweight (obese).   Diabetes.   Poor immune condition.   Certain serious medical conditions.   Immune suppressive medications for organ transplant patients.   Chemotherapy.   Pregnancy.   Menstration.   Stress and fatigue.   Intravenous drug use.   Oral contraceptives.   Wearing tight-fitting clothes in the crotch area.   Catching it from a sex partner who has a yeast infection.   Spermicide.   Intravenous, urinary, or other catheters.  Men  Catching it from a sex partner who has a yeast infection.   Having oral or anal sex with a person who has the infection.   Spermicide.   Diabetes.   Antibiotics.   Poor immune system.   Medications that suppress the immune system.   Intravenous drug use.   Intravenous, urinary, or other catheters.  SYMPTOMS  Women  Thick, white vaginal discharge.   Vaginal itching.   Redness and swelling in and around the vagina.   Irritation of the lips of the vagina and perineum.   Blisters on the vaginal lips and perineum.   Painful sexual intercourse.   Low blood sugar (hypoglycemia).   Painful urination.   Bladder infections.   Intestinal problems such as constipation,  indigestion, bad breath, bloating, increase in gas, diarrhea, or loose stools.  Men  Men may develop intestinal problems such as constipation, indigestion, bad breath, bloating, increase in gas, diarrhea, or loose stools.   Dry, cracked skin on the penis with itching or discomfort.   Jock itch.   Dry, flaky skin.   Athlete's foot.   Hypoglycemia.  DIAGNOSIS  Women  A history and an exam are performed.   The discharge may be examined under a microscope.  A culture may be taken of the discharge.  Men  A history and an exam are performed.   Any discharge from the penis or areas of cracked skin will be looked at under the microscope and cultured.   Stool samples may be cultured.  TREATMENT  Women  Vaginal antifungal suppositories and creams.   Medicated creams to decrease irritation and itching on the outside of the vagina.   Warm compresses to the perineal area to decrease swelling and discomfort.   Oral antifungal medications.   Medicated vaginal suppositories or cream for repeated or recurrent infections.   Wash and dry the irritation areas before applying the cream.   Eating yogurt with lactobacillus may help with prevention and treatment.   Sometimes painting the vagina with gentian violet solution may help if creams and suppositories do not work.  Men  Antifungal creams and oral antifungal medications.   Sometimes treatment must continue for 30 days after the symptoms go away to prevent recurrence.  HOME CARE INSTRUCTIONS  Women  Use cotton underwear and avoid tight-fitting clothing.   Avoid colored, scented toilet paper and deodorant tampons or pads.   Do not douche.   Keep your diabetes under control.   Finish all the prescribed medications.   Keep your skin clean and dry.   Consume milk or yogurt with lactobacillus active culture regularly. If you get frequent yeast infections and think that is what the infection is, there are over-the-counter  medications that you can get. If the infection does not show healing in 3 days, talk to your caregiver.   Tell your sex partner you have a yeast infection. Your partner may need treatment also, especially if your infection does not clear up or recurs.  Men  Keep your skin clean and dry.   Keep your diabetes under control.   Finish all prescribed medications.   Tell your sex partner that you have a yeast infection so they can be treated if necessary.  SEEK MEDICAL CARE IF:   Your symptoms do not clear up or worsen in one week after treatment.   You have an oral temperature above 102 F (38.9 C).   You have trouble swallowing or eating for a prolonged time.   You develop blisters on and around your vagina.   You develop vaginal bleeding and it is not your menstrual period.   You develop abdominal pain.   You develop intestinal problems as mentioned above.   You get weak or lightheaded.   You have painful or increased urination.   You have pain during sexual intercourse.  MAKE SURE YOU:   Understand these instructions.   Will watch your condition.   Will get help right away if you are not doing well or get worse.  Document Released: 09/14/2004 Document Revised: 07/27/2011 Document Reviewed: 12/27/2009 [facility] Patient Information 2012 [place], [place].

## 2011-12-12 LAB — GC/CHLAMYDIA PROBE AMP, GENITAL: Chlamydia, DNA Probe: NEGATIVE

## 2011-12-12 NOTE — ED Provider Notes (Signed)
Medical screening examination/treatment/procedure(s) were performed by non-physician practitioner and as supervising physician I was immediately available for consultation/collaboration.   Macrae Wiegman, MD 12/12/11 0127 

## 2011-12-13 ENCOUNTER — Encounter (HOSPITAL_BASED_OUTPATIENT_CLINIC_OR_DEPARTMENT_OTHER): Payer: Medicaid Other

## 2011-12-20 ENCOUNTER — Other Ambulatory Visit (HOSPITAL_COMMUNITY): Payer: Self-pay | Admitting: Plastic Surgery

## 2011-12-20 ENCOUNTER — Ambulatory Visit (HOSPITAL_COMMUNITY)
Admission: RE | Admit: 2011-12-20 | Discharge: 2011-12-20 | Disposition: A | Discharge status: Home / Self Care | Payer: Medicaid Other | Source: Ambulatory Visit | Attending: Plastic Surgery | Admitting: Plastic Surgery

## 2011-12-20 ENCOUNTER — Encounter (HOSPITAL_BASED_OUTPATIENT_CLINIC_OR_DEPARTMENT_OTHER): Payer: Medicaid Other | Attending: Plastic Surgery

## 2011-12-20 DIAGNOSIS — M25569 Pain in unspecified knee: Secondary | ICD-10-CM | POA: Insufficient documentation

## 2011-12-20 DIAGNOSIS — R52 Pain, unspecified: Secondary | ICD-10-CM

## 2011-12-20 DIAGNOSIS — T8789 Other complications of amputation stump: Secondary | ICD-10-CM | POA: Insufficient documentation

## 2011-12-20 DIAGNOSIS — L97809 Non-pressure chronic ulcer of other part of unspecified lower leg with unspecified severity: Secondary | ICD-10-CM | POA: Insufficient documentation

## 2011-12-20 DIAGNOSIS — Y835 Amputation of limb(s) as the cause of abnormal reaction of the patient, or of later complication, without mention of misadventure at the time of the procedure: Secondary | ICD-10-CM | POA: Insufficient documentation

## 2011-12-20 NOTE — Progress Notes (Signed)
[facility]  NAME:  GENNELL, HOW               ACCOUNT NO.:  [id number]  MEDICAL RECORD NO.:  [id number]      DATE OF BIRTH:  [birth year]  PHYSICIAN:  Wayland Denis, DO       VISIT DATE:  12/20/2011                                  OFFICE VISIT   The patient is a 51 year old female, here for follow up on her right lower extremity stump.  She has been using collagen on the area.  Today, she has some tendon exposed and we were able to debride that all. Overall, the area is clean and does not appear to be infected.  There has been no change in her social history or medications.  On exam, she is alert, oriented, cooperative, not in any acute distress. She is pleasant.  Pupils equal.  Extraocular muscles.  Neck, no cervical lymphadenopathy.  Breathing is unlabored.  Heart is regular.  Abdomen is soft.  The wound is clean, but tenderness present, that was debrided.  We will continue with Aquacel Ag and have her follow up in 1 week.     Wayland Denis, DO     CS/MEDQ  D:  12/20/2011  T:  12/20/2011  Job:  [id number]

## 2011-12-26 ENCOUNTER — Encounter (HOSPITAL_COMMUNITY): Payer: Self-pay | Admitting: Respiratory Therapy

## 2011-12-26 ENCOUNTER — Encounter: Payer: Self-pay | Admitting: Dietician

## 2011-12-26 ENCOUNTER — Encounter: Payer: Medicaid Other | Attending: Internal Medicine | Admitting: Dietician

## 2011-12-26 ENCOUNTER — Other Ambulatory Visit (HOSPITAL_COMMUNITY): Payer: Self-pay | Admitting: Orthopedic Surgery

## 2011-12-26 VITALS — Ht 65.0 in | Wt 198.4 lb

## 2011-12-26 DIAGNOSIS — R739 Hyperglycemia, unspecified: Secondary | ICD-10-CM

## 2011-12-26 DIAGNOSIS — E119 Type 2 diabetes mellitus without complications: Secondary | ICD-10-CM | POA: Insufficient documentation

## 2011-12-26 DIAGNOSIS — Z713 Dietary counseling and surveillance: Secondary | ICD-10-CM | POA: Insufficient documentation

## 2011-12-26 NOTE — Progress Notes (Signed)
  Medical Nutrition Therapy:  Appt start time: 1115 end time:  1245.  Assessment:  Primary concerns today: Want my sugars to come down real bad.  History of DM for 20 years.  Has had RT  BKA fall of 2012.  Was measured for prosthesis and special shoe for the Lf foot.  Just prior to these arriving, she developed infection in the RT amputation site.  For the last 3+ months, has been on antibiotics with increasing infection.  Currently, she is posted to have surgery for a form of debridement on 12/29/11.  The hope is that this will help facilitate more normal blood glucose levels and enable healing of the amputation site.  Currently is being seen at the wound care center.  BLOOD GLUCOSE LEVELS: Monitors 2-3 times per day.    AM: (7 A-11A): 564,448, 452, 380, 523, 404, 522, 433 406, 226, 493, 456.  2:00 PM: 436, 350, 340  5:00-8:00PM: 369, 512, 586, 334, 328, 558, 347, 340  MEDICATIONS: Completed review of medications.  Notes she forgot to take her Levemir insulin last evening.  Does not take her insulin at consistent times.  Is on a sliding scale for her Novolog and does not recall the recommendations.     DIETARY INTAKE:  24-hr recall:  B (9:00-11:00 AM): boiled eggs(2) with cup of yogurt or boiled eggs and a piece of wheat toast (1) OR oatmeal (box) 2 cups with sweet and low.  Drink water or skim milk (8-16 oz). L (1:00-3:00 PM): grill chicken salad.  (Herbies) 2.5 cup veggies and grill chicken and ranch dressing.Use the five containers of dressing.  Crackers, club crackers (6), water.    D (7:00-8:00 PM): pintos (1 cup) with onion, and small cornbread (2 inch +)and 3 small baby back ribs (BBQ) Water.  Beverages: water, milk,   Rare snack of banana, orange, apple, applesauce, cheese,   Recent physical activity: Push wheel chair and use walker to the car.  Uses WC in the house.  Had PT in the past.  Will likely be a candidate for chair exercises until she gets to PT for use of the prosthesis.     Estimated energy needs:HT:65 in  WT: 198.4 lb  BMI: 33.1 kg/m2  ADJ WT: 148 lb (67 kg)   1500-1600 calories  For tissue healing. 165-170 g carbohydrates 115-120 g protein 40-42 g fat  Progress Towards Goal(s):  In progress.   Nutritional Diagnosis:  Emily-2.1 Inpaired nutrition utilization As related to glucose.  As evidenced by diagnosis of uncontrolled diabetes with wound infection, last A1C of 12.7% .Marland Kitchen    Intervention:  Nutrition Review of good eating practices and carbohydrate counting.  Emphasized the need for portion control and limiting fat and fried foods.  Dietary prescription provided for 45-60 gm of carb per meal, with lean meat and limited fat.  Snacks at 15 gm of CHO plus a small serving of protein.  Handouts given during visit include:  Living Well with Diabetes  List of non-starchy vegetables  Controlling blood glucose  Yellow card with food group portions.   Monitoring/Evaluation:  Dietary intake, exercise, blood glucose levels, and body weight in 8-12 weeks after surgery.  To call for appointment.

## 2011-12-26 NOTE — Patient Instructions (Addendum)
   Need a doctor's prescription for the lancets for the Accu Chek Aviva .  Lancing device is the Accu-Check Multiclix.  Need Multiclix lancets.  There are 6 lancets in each cylinder.  Ask Dr. Kelly Splinter regarding using the 81 mg Aspirin before surgery on Friday.    Don't skip your Levemir take at same time every night.  Take with your 8:00 PM medications.  Get up and take the thyroid pill on an empty stomach.  Wait 60 minutes then you can eat.  (fix meal, check blood glucose, shower) then take insulin and eat.    Take your meter and lancets to all appointments.  Have some hard sugar candy (5-6 pieces in your bag and a pack of PB cracker)  Regular meals and snacks !!!!!   Meal Plan:  Try to keep Carbs to 3-4 servings per meal.  (45-60 gm per meal) and 15 gm at snacks.      Have protein at all meals and snacks.          Keep fat servings at 2-3 per meal.

## 2011-12-28 ENCOUNTER — Encounter (HOSPITAL_COMMUNITY)
Admission: RE | Admit: 2011-12-28 | Discharge: 2011-12-28 | Disposition: A | Discharge status: Home / Self Care | Payer: Medicaid Other | Source: Ambulatory Visit | Attending: Orthopedic Surgery | Admitting: Orthopedic Surgery

## 2011-12-28 ENCOUNTER — Encounter (HOSPITAL_COMMUNITY): Payer: Self-pay

## 2011-12-28 HISTORY — DX: Carrier or suspected carrier of methicillin resistant Staphylococcus aureus: Z22.322

## 2011-12-28 HISTORY — DX: Unspecified staphylococcus as the cause of diseases classified elsewhere: B95.8

## 2011-12-28 LAB — COMPREHENSIVE METABOLIC PANEL
AST: 14 U/L (ref 0–37)
CO2: 25 mEq/L (ref 19–32)
Calcium: 9.4 mg/dL (ref 8.4–10.5)
Creatinine, Ser: 0.64 mg/dL (ref 0.50–1.10)
GFR calc Af Amer: >90 mL/min (ref >90)
GFR calc non Af Amer: >90 mL/min (ref >90)

## 2011-12-28 LAB — PROTIME-INR: INR: 0.83 (ref 0.00–1.49)

## 2011-12-28 LAB — CBC
MCH: 30.1 pg (ref 26.0–34.0)
MCV: 83.9 fL (ref 78.0–100.0)
Platelets: 192 10*3/uL (ref 150–400)
RBC: 4.79 MIL/uL (ref 3.87–5.11)

## 2011-12-28 LAB — HCG, SERUM, QUALITATIVE: Preg, Serum: NEGATIVE

## 2011-12-28 MED ORDER — CLINDAMYCIN PHOSPHATE 600 MG/50ML IV SOLN
600.0000 mg | INTRAVENOUS | Status: AC
  Administered 2011-12-29: 600 mg via INTRAVENOUS
  Filled 2011-12-28 (×2): qty 50

## 2011-12-28 NOTE — Consult Note (Signed)
Anesthesia Consult:  Patient is a 52 year old female scheduled for revision of her right BKA stump on 12/29/11.  PAT appointment was 12/28/11.  Her initial BKA was done in September 2012.  She has been followed by Dr. Kelly Splinter in the Wound Center.  Patient denies that her stump is infected, but rather says there is some thinning over the bone at the incision site.  She will need it revised in hopes to avoid a pressure ulcer with her prosthesis.  She is an uncontrolled diabetic (Type II). Currently followed by Dr. Philipp Deputy at [facility], but is seeing Endocrinologist Dr. Gaynelle Cage on 01/09/12.  Her fasting sugars have recently been in the 400-500 range.  She was hospitalized in February 2013 for encephalopathy secondary to hyperglycemia, narcotics, hypercapnia.  She was treated with Bi-Pap, narcan, and insulin.  Additional history reviewed in Epic.  She denies chest pain or known history of CAD/MI/CHF.  EKG on 12/28/11 shows NSR, LAFB. LVH with repolarization abnormality.    Echo on 03/22/10 showed: Left ventricle: The cavity size was normal. Wall thickness was increased in a pattern of moderate LVH. There was mild focal basal hypertrophy of the septum. Systolic function was normal. The estimated ejection fraction was in the range of 60% to 65%. Wall motion was normal; there were no regional wall motion abnormalities.Doppler parameters are consistent with abnormal left ventricular relaxation (grade 1 diastolic dysfunction).   She had a negative CXR on 12/11/11.  Labs noted.  Glucose 360.  I reviewed her uncontrolled DM history and EKG with Anesthesiologist Dr. Michelle Piper.  Since she is scheduled for the afternoon, plan for her to take 1/2 her usual Levemir dose tonight.  She will get a CBG on arrival, and hyperglycemia treated at that time as indicated.  She was told that she may come early for close glucose monitoring if she feels this is needed.  Shonna Chock, PA-C

## 2011-12-28 NOTE — Progress Notes (Signed)
Pt doesn't have a cardiologist  Denies stress test/heart cath Echo done in 2011 VOllmer with [facility] is medical MD

## 2011-12-28 NOTE — Pre-Procedure Instructions (Signed)
20 [name]  12/28/2011   Your procedure is scheduled on:  Fri, May 10 @ 1:55 PM  Report to [facility] at 11:45 AM.  Call this number if you have problems the morning of surgery: [phone]   Remember:   Do not eat food:After Midnight.  May have clear liquids: up to 4 Hours before arrival.(until 7:45 am)  Clear liquids include soda, tea, black coffee, apple or grape juice, broth,water  Take these medicines the morning of surgery with A SIP OF WATER: Cymbalta,Pain Pill(if needed),Synthroid,Reglan,and Ditropan   Do not wear jewelry, make-up or nail polish.  Do not wear lotions, powders, or perfumes.   Do not shave 48 hours prior to surgery.  Do not bring valuables to the hospital.  Contacts, dentures or bridgework may not be worn into surgery.  Leave suitcase in the car. After surgery it may be brought to your room.  For patients admitted to the hospital, checkout time is 11:00 AM the day of discharge.   Special Instructions: CHG Shower Use Special Wash: 1/2 bottle night before surgery and 1/2 bottle morning of surgery.   Please read over the following fact sheets that you were given: Pain Booklet, Coughing and Deep Breathing, MRSA Information and Surgical Site Infection Prevention

## 2011-12-28 NOTE — Progress Notes (Signed)
Average fasting sugar runs around 400  Takes 56units at bedtime of Levemir

## 2011-12-29 ENCOUNTER — Encounter (HOSPITAL_COMMUNITY): Payer: Self-pay | Admitting: Vascular Surgery

## 2011-12-29 ENCOUNTER — Encounter (HOSPITAL_COMMUNITY)
Admission: RE | Disposition: A | Discharge status: Home / Self Care | Payer: Self-pay | Source: Ambulatory Visit | Attending: Orthopedic Surgery

## 2011-12-29 ENCOUNTER — Ambulatory Visit (HOSPITAL_COMMUNITY): Payer: Medicaid Other | Admitting: Vascular Surgery

## 2011-12-29 ENCOUNTER — Ambulatory Visit (HOSPITAL_COMMUNITY)
Admission: RE | Admit: 2011-12-29 | Discharge: 2011-12-29 | Disposition: A | Discharge status: Home / Self Care | Payer: Medicaid Other | Source: Ambulatory Visit | Attending: Orthopedic Surgery | Admitting: Orthopedic Surgery

## 2011-12-29 DIAGNOSIS — E119 Type 2 diabetes mellitus without complications: Secondary | ICD-10-CM

## 2011-12-29 DIAGNOSIS — Z01812 Encounter for preprocedural laboratory examination: Secondary | ICD-10-CM | POA: Insufficient documentation

## 2011-12-29 DIAGNOSIS — Y835 Amputation of limb(s) as the cause of abnormal reaction of the patient, or of later complication, without mention of misadventure at the time of the procedure: Secondary | ICD-10-CM | POA: Insufficient documentation

## 2011-12-29 DIAGNOSIS — E785 Hyperlipidemia, unspecified: Secondary | ICD-10-CM | POA: Insufficient documentation

## 2011-12-29 DIAGNOSIS — G609 Hereditary and idiopathic neuropathy, unspecified: Secondary | ICD-10-CM

## 2011-12-29 DIAGNOSIS — T8789 Other complications of amputation stump: Secondary | ICD-10-CM | POA: Insufficient documentation

## 2011-12-29 DIAGNOSIS — I10 Essential (primary) hypertension: Secondary | ICD-10-CM | POA: Insufficient documentation

## 2011-12-29 DIAGNOSIS — Z01811 Encounter for preprocedural respiratory examination: Secondary | ICD-10-CM | POA: Insufficient documentation

## 2011-12-29 DIAGNOSIS — M869 Osteomyelitis, unspecified: Secondary | ICD-10-CM

## 2011-12-29 DIAGNOSIS — E1149 Type 2 diabetes mellitus with other diabetic neurological complication: Secondary | ICD-10-CM | POA: Insufficient documentation

## 2011-12-29 DIAGNOSIS — E1142 Type 2 diabetes mellitus with diabetic polyneuropathy: Secondary | ICD-10-CM | POA: Insufficient documentation

## 2011-12-29 DIAGNOSIS — Z01810 Encounter for preprocedural cardiovascular examination: Secondary | ICD-10-CM | POA: Insufficient documentation

## 2011-12-29 LAB — GLUCOSE, CAPILLARY
Glucose-Capillary: 404 mg/dL — ABNORMAL HIGH (ref 70–99)
Glucose-Capillary: 374 mg/dL — ABNORMAL HIGH (ref 70–99)

## 2011-12-29 SURGERY — REVISION, AMPUTATION SITE
Anesthesia: General | Site: Leg Lower | Laterality: Right | Wound class: Contaminated

## 2011-12-29 MED ORDER — MORPHINE SULFATE 4 MG/ML IJ SOLN: 0.0500 mg/kg | INTRAMUSCULAR | Status: DC | PRN

## 2011-12-29 MED ORDER — LIDOCAINE HCL (CARDIAC) 20 MG/ML IV SOLN
INTRAVENOUS | Status: DC | PRN
  Administered 2011-12-29: 100 mg via INTRAVENOUS

## 2011-12-29 MED ORDER — INSULIN ASPART 100 UNIT/ML ~~LOC~~ SOLN
SUBCUTANEOUS | Status: AC
  Administered 2011-12-29: 10 [IU] via INTRAVENOUS
  Filled 2011-12-29: qty 1

## 2011-12-29 MED ORDER — ONDANSETRON HCL 4 MG/2ML IJ SOLN
INTRAMUSCULAR | Status: DC | PRN
  Administered 2011-12-29: 4 mg via INTRAVENOUS

## 2011-12-29 MED ORDER — PROPOFOL 10 MG/ML IV EMUL
INTRAVENOUS | Status: DC | PRN
  Administered 2011-12-29: 120 mg via INTRAVENOUS

## 2011-12-29 MED ORDER — HYDROMORPHONE HCL PF 1 MG/ML IJ SOLN
INTRAMUSCULAR | Status: AC
  Filled 2011-12-29: qty 1

## 2011-12-29 MED ORDER — INSULIN REGULAR HUMAN 100 UNIT/ML IJ SOLN: 10.0000 [IU] | Freq: Once | INTRAMUSCULAR | Status: DC

## 2011-12-29 MED ORDER — HYDROMORPHONE HCL PF 1 MG/ML IJ SOLN
INTRAMUSCULAR | Status: AC
  Administered 2011-12-29: 0.5 mg
  Filled 2011-12-29: qty 1

## 2011-12-29 MED ORDER — SODIUM CHLORIDE 0.9 % IV SOLN
INTRAVENOUS | Status: DC | PRN
  Administered 2011-12-29: 12:00:00 via INTRAVENOUS

## 2011-12-29 MED ORDER — MORPHINE SULFATE 10 MG/ML IJ SOLN
INTRAMUSCULAR | Status: DC | PRN
  Administered 2011-12-29 (×2): 5 mg via INTRAVENOUS

## 2011-12-29 MED ORDER — INSULIN ASPART 100 UNIT/ML ~~LOC~~ SOLN
10.0000 [IU] | Freq: Once | SUBCUTANEOUS | Status: DC
  Filled 2011-12-29: qty 3

## 2011-12-29 MED ORDER — HYDROMORPHONE HCL PF 1 MG/ML IJ SOLN
0.2500 mg | INTRAMUSCULAR | Status: DC | PRN
  Administered 2011-12-29 (×4): 0.5 mg via INTRAVENOUS

## 2011-12-29 MED ORDER — ONDANSETRON HCL 4 MG/2ML IJ SOLN: 4.0000 mg | Freq: Once | INTRAMUSCULAR | Status: DC | PRN

## 2011-12-29 MED ORDER — MIDAZOLAM HCL 5 MG/5ML IJ SOLN
INTRAMUSCULAR | Status: DC | PRN
  Administered 2011-12-29: 2 mg via INTRAVENOUS

## 2011-12-29 MED ORDER — FENTANYL CITRATE 0.05 MG/ML IJ SOLN
INTRAMUSCULAR | Status: DC | PRN
  Administered 2011-12-29: 50 ug via INTRAVENOUS
  Administered 2011-12-29 (×2): 100 ug via INTRAVENOUS

## 2011-12-29 SURGICAL SUPPLY — 49 items
BANDAGE ESMARK 6X9 LF (GAUZE/BANDAGES/DRESSINGS) ×1 IMPLANT
BANDAGE GAUZE ELAST BULKY 4 IN (GAUZE/BANDAGES/DRESSINGS) ×2 IMPLANT
BLADE SAW RECIP 87.9 MT (BLADE) ×2 IMPLANT
BNDG COHESIVE 6X5 TAN STRL LF (GAUZE/BANDAGES/DRESSINGS) ×2 IMPLANT
BNDG ESMARK 6X9 LF (GAUZE/BANDAGES/DRESSINGS) ×2
BNDG GAUZE STRTCH 6 (GAUZE/BANDAGES/DRESSINGS) IMPLANT
CLOTH BEACON ORANGE TIMEOUT ST (SAFETY) ×2 IMPLANT
COVER SURGICAL LIGHT HANDLE (MISCELLANEOUS) ×2 IMPLANT
CUFF TOURNIQUET SINGLE 34IN LL (TOURNIQUET CUFF) IMPLANT
DRAIN PENROSE 1/2X12 LTX STRL (WOUND CARE) IMPLANT
DRAPE EXTREMITY T 121X128X90 (DRAPE) ×2 IMPLANT
DRAPE PROXIMA HALF (DRAPES) ×4 IMPLANT
DRAPE U-SHAPE 47X51 STRL (DRAPES) ×4 IMPLANT
DRSG ADAPTIC 3X8 NADH LF (GAUZE/BANDAGES/DRESSINGS) ×2 IMPLANT
DRSG PAD ABDOMINAL 8X10 ST (GAUZE/BANDAGES/DRESSINGS) ×2 IMPLANT
DURAPREP 26ML APPLICATOR (WOUND CARE) ×2 IMPLANT
ELECT CAUTERY BLADE 6.4 (BLADE) IMPLANT
ELECT REM PT RETURN 9FT ADLT (ELECTROSURGICAL) ×2
ELECTRODE REM PT RTRN 9FT ADLT (ELECTROSURGICAL) ×1 IMPLANT
EVACUATOR 1/8 PVC DRAIN (DRAIN) IMPLANT
GLOVE BIOGEL PI IND STRL 9 (GLOVE) ×1 IMPLANT
GLOVE BIOGEL PI INDICATOR 9 (GLOVE) ×1
GLOVE SURG ORTHO 9.0 STRL STRW (GLOVE) ×2 IMPLANT
GOWN PREVENTION PLUS XLARGE (GOWN DISPOSABLE) ×2 IMPLANT
GOWN SRG XL XLNG 56XLVL 4 (GOWN DISPOSABLE) ×1 IMPLANT
GOWN STRL NON-REIN XL XLG LVL4 (GOWN DISPOSABLE) ×1
KIT BASIN OR (CUSTOM PROCEDURE TRAY) ×2 IMPLANT
KIT ROOM TURNOVER OR (KITS) ×2 IMPLANT
MANIFOLD NEPTUNE II (INSTRUMENTS) ×2 IMPLANT
NS IRRIG 1000ML POUR BTL (IV SOLUTION) ×2 IMPLANT
PACK GENERAL/GYN (CUSTOM PROCEDURE TRAY) ×2 IMPLANT
PAD ARMBOARD 7.5X6 YLW CONV (MISCELLANEOUS) ×4 IMPLANT
PAD CAST 4YDX4 CTTN HI CHSV (CAST SUPPLIES) ×1 IMPLANT
PADDING CAST COTTON 4X4 STRL (CAST SUPPLIES) ×1
PADDING CAST COTTON 6X4 STRL (CAST SUPPLIES) ×2 IMPLANT
SPONGE GAUZE 4X4 12PLY (GAUZE/BANDAGES/DRESSINGS) ×2 IMPLANT
SPONGE LAP 18X18 X RAY DECT (DISPOSABLE) IMPLANT
STAPLER VISISTAT 35W (STAPLE) IMPLANT
STOCKINETTE IMPERVIOUS LG (DRAPES) IMPLANT
SUT PDS AB 1 CT  36 (SUTURE)
SUT PDS AB 1 CT 36 (SUTURE) IMPLANT
SUT SILK 2 0 (SUTURE) ×1
SUT SILK 2-0 18XBRD TIE 12 (SUTURE) ×1 IMPLANT
SUT VIC AB 1 CTX 36 (SUTURE)
SUT VIC AB 1 CTX36XBRD ANBCTR (SUTURE) IMPLANT
TOWEL OR 17X24 6PK STRL BLUE (TOWEL DISPOSABLE) ×2 IMPLANT
TOWEL OR 17X26 10 PK STRL BLUE (TOWEL DISPOSABLE) ×2 IMPLANT
TUBE ANAEROBIC SPECIMEN COL (MISCELLANEOUS) IMPLANT
WATER STERILE IRR 1000ML POUR (IV SOLUTION) ×2 IMPLANT

## 2011-12-29 NOTE — Anesthesia Procedure Notes (Signed)
Procedure Name: LMA Insertion Date/Time: 12/29/2011 1:33 PM Performed by: Romie Minus K Pre-anesthesia Checklist: Patient identified, Emergency Drugs available, Suction available, Patient being monitored and Timeout performed Patient Re-evaluated:Patient Re-evaluated prior to inductionOxygen Delivery Method: Circle system utilized Preoxygenation: Pre-oxygenation with 100% oxygen Intubation Type: IV induction Ventilation: Mask ventilation without difficulty LMA: LMA inserted LMA Size: 4.0 Number of attempts: 1 Tube secured with: Tape Dental Injury: Teeth and Oropharynx as per pre-operative assessment

## 2011-12-29 NOTE — Preoperative (Signed)
Beta Blockers   Reason not to administer Beta Blockers:Not Applicable 

## 2011-12-29 NOTE — Op Note (Signed)
OPERATIVE REPORT  DATE OF SURGERY: 12/29/2011  PATIENT:  [name],  51 y.o. 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 She has failed wound care management and presents at this time for revision transtibial amputation. Risks and benefits were discussed including infection neurovascular injury persistent pain DVT pulmonary embolus need for additional surgery. Patient states she understands and wished to proceed at this time. Description of procedure patient was brought to or room 10 and underwent a general anesthetic. After adequate levels of anesthesia were obtained patient's right lower extremity was prepped using DuraPrep and draped into a sterile field. A fishmouth incision was made around the sinus tracts. This was carried down to bone the distal centimeter of the tibia and fibular were resected in one block of tissue. The wound was irrigated with normal saline hemostasis was obtained. The wound edges had good petechial bleeding good contractility good consistency. The wound was closed using 2-0 nylon and approximate staples. The wound was covered with Adaptic orthopedic sponges AB dressing Kerlix and Coban. Patient was extubated taken to the PACU in stable condition plan for discharge to home followup in the office in 2 weeks.  PLAN OF CARE: Discharge to home after PACU  PATIENT DISPOSITION:  PACU - hemodynamically stable.   Nadara Mustard, MD 12/29/2011 2:09 PM

## 2011-12-29 NOTE — Discharge Instructions (Addendum)
Stump Care  SKIN CARE  Each day, look closely at the skin on your stump.   Use a mirror with a long handle to check areas you cannot see or ask a friend or family member to look.   Check for areas that look irritated. They might be reddish or swollen. Make sure there are no open sores. If there are, call your caregiver.   Keep the skin of the stump clean and dry. You should:   Wash with a mild, antibacterial soap at least once a day. This is a special soap to fight germs. Wash more often if you get dirty or sweaty often.   Do not soak in a warm or hot bath for long periods (longer than 20 minutes).   Use a cotton towel to pat the stump dry. Let it air-dry for another 5 to 10 minutes.   Do not use lotions that contain petroleum jelly. Do not use skin care products with an alcohol base. These products can be harmful to your skin. They can also damage the lining of the prosthesis.   Put creams and lotions on your stump only if your caregiver says it is okay to do so.   Do not shave the stump. Hair that grows out after being shaved is more easily irritated by the prosthesis.   Consider using an antiperspirant spray on the skin of the stump.   Take care of the surgical scar. Your caregiver may instruct you to rub an ointment on it. This can keep the scar soft and help it heal.   Pay attention to pressure points. These are places where the prosthesis and stump rub together. Even small contact areas can become sore. Ask your caregiver if adjustments need to be made.  HOME CARE INSTRUCTIONS   Take any medicine that your caregiver has suggested. This may include pain medicine.   Some people have what is called phantom limb pain. You feel pain or tingling just like you would if the limb you lost was still there. Tell your caregiver about this. Treatment may be possible.   You might need to wear a compression stocking (elastic bandage or sock) when you are not wearing the prosthesis. These  are often called "stump shrinkers."   Learn how to sit and lie down in a way that will not cause changes in your stump or adjacent joints. Otherwise, this can affect how the prosthesis fits. For example, do not put pillows under the stump.   Do whatever exercises and movements your physical therapist recommended. This might include practicing everyday tasks.   Keep your weight stable. Losing or gaining weight will quickly change the way the prosthesis fits.   It is okay to lose weight gradually. Just check with your prosthetist every so often to make sure the prosthesis still fits well.   Keep all follow-up appointments with both your primary caregiver and prosthetist. This is important to make sure the stump is healthy and the prosthesis is working as it should.  SEEK MEDICAL CARE IF:   You have any questions about medicine.   Pain continues, even after taking medicine.   The prosthesis does not seem to fit correctly or it starts to rub unevenly.   The amount of padding between the prosthesis and the stump needs to be changed.   Sweating between the stump and the prosthesis is heavy and efforts to control it do not work.   An itchy rash develops on the stump.  SEEK  IMMEDIATE MEDICAL CARE IF:   Your residual limb feels markedly colder than the upper part of the limb.   A sore on the stump is not healing.   The stump is red, swollen, painful, or hot. These could be signs of an infection.   Your skin looks gray or black.   A bad smell develops around the stump.  Document Released: 11/01/2009 Document Revised: 07/27/2011 Document Reviewed: 11/01/2009 [facility] Patient Information 2012 [place], [place].  PATIENT INSTRUCTIONS POST-ANESTHESIA  IMMEDIATELY FOLLOWING SURGERY:  Do not drive or operate machinery for the first twenty four hours after surgery.  Do not make any important decisions for twenty four hours after surgery or while taking narcotic pain medications or sedatives.   If you develop intractable nausea and vomiting or a severe headache please notify your doctor immediately.  FOLLOW-UP:  Please make an appointment with your surgeon as instructed. You do not need to follow up with anesthesia unless specifically instructed to do so.  WOUND CARE INSTRUCTIONS (if applicable):  Keep a dry clean dressing on the anesthesia/puncture wound site if there is drainage.  Once the wound has quit draining you may leave it open to air.  Generally you should leave the bandage intact for twenty four hours unless there is drainage.  If the epidural site drains for more than 36-48 hours please call the anesthesia department.  QUESTIONS?:  Please feel free to call your physician or the hospital operator if you have any questions, and they will be happy to assist you.     [facility] Anesthesia Department [address] [place] [phone]

## 2011-12-29 NOTE — Transfer of Care (Signed)
Immediate Anesthesia Transfer of Care Note  Patient: [name]  Procedure(s) Performed: Procedure(s) (LRB): STUMP REVISION (Right)  Patient Location: PACU  Anesthesia Type: General  Level of Consciousness: awake, alert , oriented and patient cooperative  Airway & Oxygen Therapy: Patient Spontanous Breathing and Patient connected to nasal cannula oxygen  Post-op Assessment: Report given to PACU RN and Post -op Vital signs reviewed and stable  Post vital signs: Reviewed and stable  Complications: No apparent anesthesia complications

## 2011-12-29 NOTE — H&P (Signed)
[name] is an 51 y.o. female.   Chief Complaint: dehiscence right BKA HPI: developed dehiscence of BKA, failure of conservative wound care for wound healing  Past Medical History  Diagnosis Date  . Diabetic neuropathy   . Cellulitis   . Thyroid disease   . Hyperlipidemia     not on meds  . Heart murmur   . Arthritis   . Neuromuscular disorder   . Hypertension     hx of;but doesn't require meds anymore;has been off of x 53yr  . Cough   . Pneumonia     hx of;>67yr ago   . Headache     occasionally  . Concussion as a teenager    slight  . Peripheral neuropathy   . Joint pain   . Joint swelling   . Dry skin   . Diarrhea     pt takes Reglan tid  . Urinary frequency   . Urinary urgency   . History of kidney stones   . Nocturia   . Hypothyroidism     takes Synthroid daily  . Diabetes mellitus     Novolog,Levemir,and Lovaza daily  . Depression     takes CYmbalta daily  . Peripheral edema     takes Lasix daily  . MRSA (methicillin resistant staph aureus) culture positive     hx of 2012  . Staph infection 2007    Past Surgical History  Procedure Date  . Amputation   . Leg amputation     right leg amputation below knee  . Toe amputation     left foot only 2 toes remaning.   . Tubal ligation   . Foot surgery     right foot  . Wrist surgery     right tendon  . Dilation and curettage of uterus   . Refractive surgery     left    Family History  Problem Relation Age of Onset  . Hypertension Mother   . Hyperlipidemia Mother   . Hypertension Brother   . Hyperlipidemia Brother   . Cancer Maternal Aunt   . Diabetes Paternal Aunt   . Diabetes Paternal Uncle   . Diabetes Paternal Grandmother   . Anesthesia problems Neg Hx   . Hypotension Neg Hx   . Malignant hyperthermia Neg Hx   . Pseudochol deficiency Neg Hx    Social History:  reports that she has been smoking.  She has never used smokeless tobacco. She reports that she does not drink alcohol or use  illicit drugs.  Allergies:  Allergies  Allergen Reactions  . Gadolinium      Code: VOM, Desc: Pt began vomiting immed post infusion of multihance, Onset Date: [id number]   . Ivp Dye (Iodinated Diagnostic Agents) Nausea And Vomiting  . Metformin     REACTION: diarrhea  . Penicillins Hives    No prescriptions prior to admission    Results for orders placed during the hospital encounter of 12/28/11 (from the past 48 hour(s))  APTT     Status: Normal   Collection Time   12/28/11  2:42 PM      Component Value Range Comment   aPTT 29  24 - 37 (seconds)   CBC     Status: Normal   Collection Time   12/28/11  2:42 PM      Component Value Range Comment   WBC 6.4  4.0 - 10.5 (K/uL)    RBC 4.79  3.87 - 5.11 (MIL/uL)  Hemoglobin 14.4  12.0 - 15.0 (g/dL)    HCT 16.1  09.6 - 04.5 (%)    MCV 83.9  78.0 - 100.0 (fL)    MCH 30.1  26.0 - 34.0 (pg)    MCHC 35.8  30.0 - 36.0 (g/dL)    RDW 40.9  81.1 - 91.4 (%)    Platelets 192  150 - 400 (K/uL)   COMPREHENSIVE METABOLIC PANEL     Status: Abnormal   Collection Time   12/28/11  2:42 PM      Component Value Range Comment   Sodium 134 (*) 135 - 145 (mEq/L)    Potassium 4.7  3.5 - 5.1 (mEq/L)    Chloride 97  96 - 112 (mEq/L)    CO2 25  19 - 32 (mEq/L)    Glucose, Bld 360 (*) 70 - 99 (mg/dL)    BUN 30 (*) 6 - 23 (mg/dL)    Creatinine, Ser 7.82  0.50 - 1.10 (mg/dL)    Calcium 9.4  8.4 - 10.5 (mg/dL)    Total Protein 7.4  6.0 - 8.3 (g/dL)    Albumin 3.2 (*) 3.5 - 5.2 (g/dL)    AST 14  0 - 37 (U/L)    ALT 13  0 - 35 (U/L)    Alkaline Phosphatase 109  39 - 117 (U/L)    Total Bilirubin 0.2 (*) 0.3 - 1.2 (mg/dL)    GFR calc non Af Amer >90  >90 (mL/min)    GFR calc Af Amer >90  >90 (mL/min)   PROTIME-INR     Status: Normal   Collection Time   12/28/11  2:42 PM      Component Value Range Comment   Prothrombin Time 11.6  11.6 - 15.2 (seconds)    INR 0.83  0.00 - 1.49    HCG, SERUM, QUALITATIVE     Status: Normal   Collection Time   12/28/11  2:42  PM      Component Value Range Comment   Preg, Serum NEGATIVE  NEGATIVE    SURGICAL PCR SCREEN     Status: Abnormal   Collection Time   12/28/11  2:42 PM      Component Value Range Comment   MRSA, PCR NEGATIVE  NEGATIVE     Staphylococcus aureus POSITIVE (*) NEGATIVE     No results found.  Review of Systems  All other systems reviewed and are negative.    Last menstrual period 12/01/2011. Physical Exam  Necrotic wound dehiscence right BKA Assessment/Plan BKA dehiscence Plan for transtibial revision amputation  [name] 12/29/2011, 5:56 AM

## 2011-12-29 NOTE — Anesthesia Preprocedure Evaluation (Addendum)
Anesthesia Evaluation  Patient identified by MRN, date of birth, ID band Patient awake    Reviewed: Allergy & Precautions, H&P , NPO status , Patient's Chart, lab work & pertinent test results  Airway Mallampati: II TM Distance: >3 FB Neck ROM: Full    Dental  (+) Edentulous Upper and Edentulous Lower   Pulmonary          Cardiovascular + Valvular Problems/Murmurs     Neuro/Psych  Headaches, PSYCHIATRIC DISORDERS Depression  Neuromuscular disease    GI/Hepatic   Endo/Other  Diabetes mellitus-, Poorly Controlled, Type 2, Insulin DependentHypothyroidism   Renal/GU      Musculoskeletal   Abdominal   Peds  Hematology   Anesthesia Other Findings   Reproductive/Obstetrics                           Anesthesia Physical Anesthesia Plan  ASA: III  Anesthesia Plan: General   Post-op Pain Management:    Induction: Intravenous  Airway Management Planned: LMA  Additional Equipment:   Intra-op Plan:   Post-operative Plan: Extubation in OR  Informed Consent: I have reviewed the patients History and Physical, chart, labs and discussed the procedure including the risks, benefits and alternatives for the proposed anesthesia with the patient or authorized representative who has indicated his/her understanding and acceptance.     Plan Discussed with: Surgeon and CRNA  Anesthesia Plan Comments:        Anesthesia Quick Evaluation

## 2011-12-29 NOTE — Anesthesia Postprocedure Evaluation (Signed)
Anesthesia Post Note  Patient: [name]  Procedure(s) Performed: Procedure(s) (LRB): STUMP REVISION (Right)  Anesthesia type: general  Patient location: PACU  Post pain: Pain level controlled  Post assessment: Patient's Cardiovascular Status Stable  Last Vitals:  Filed Vitals:   12/29/11 1415  BP: 126/56  Pulse:   Temp:   Resp:     Post vital signs: Reviewed and stable  Level of consciousness: sedated  Complications: No apparent anesthesia complications

## 2011-12-29 NOTE — Progress Notes (Signed)
Notified Dr.Ossey of pts blood sugar 404 on arrival;orders received to start an IV of NS @ KVO give Regular Insulin 10units IV and then recheck blood sugar an hour later

## 2011-12-29 NOTE — Progress Notes (Signed)
Reported to dr. Michelle Piper cbg of 322 no new orders.  Pt to receiver .5 mg dilauded per dr. Hurman Horn

## 2012-01-20 ENCOUNTER — Emergency Department (HOSPITAL_COMMUNITY)
Admission: EM | Admit: 2012-01-20 | Discharge: 2012-01-20 | Disposition: A | Discharge status: Home / Self Care | Payer: Medicaid Other | Attending: Emergency Medicine | Admitting: Emergency Medicine

## 2012-01-20 ENCOUNTER — Encounter (HOSPITAL_COMMUNITY): Payer: Self-pay

## 2012-01-20 DIAGNOSIS — E1149 Type 2 diabetes mellitus with other diabetic neurological complication: Secondary | ICD-10-CM | POA: Insufficient documentation

## 2012-01-20 DIAGNOSIS — F17200 Nicotine dependence, unspecified, uncomplicated: Secondary | ICD-10-CM | POA: Insufficient documentation

## 2012-01-20 DIAGNOSIS — R1110 Vomiting, unspecified: Secondary | ICD-10-CM | POA: Insufficient documentation

## 2012-01-20 DIAGNOSIS — Z794 Long term (current) use of insulin: Secondary | ICD-10-CM | POA: Insufficient documentation

## 2012-01-20 DIAGNOSIS — E079 Disorder of thyroid, unspecified: Secondary | ICD-10-CM | POA: Insufficient documentation

## 2012-01-20 DIAGNOSIS — F1010 Alcohol abuse, uncomplicated: Secondary | ICD-10-CM | POA: Insufficient documentation

## 2012-01-20 DIAGNOSIS — E785 Hyperlipidemia, unspecified: Secondary | ICD-10-CM | POA: Insufficient documentation

## 2012-01-20 DIAGNOSIS — R739 Hyperglycemia, unspecified: Secondary | ICD-10-CM

## 2012-01-20 DIAGNOSIS — I10 Essential (primary) hypertension: Secondary | ICD-10-CM | POA: Insufficient documentation

## 2012-01-20 DIAGNOSIS — E1142 Type 2 diabetes mellitus with diabetic polyneuropathy: Secondary | ICD-10-CM | POA: Insufficient documentation

## 2012-01-20 DIAGNOSIS — E860 Dehydration: Secondary | ICD-10-CM | POA: Insufficient documentation

## 2012-01-20 DIAGNOSIS — S88119A Complete traumatic amputation at level between knee and ankle, unspecified lower leg, initial encounter: Secondary | ICD-10-CM | POA: Insufficient documentation

## 2012-01-20 LAB — DIFFERENTIAL
Basophils Absolute: 0 10*3/uL (ref 0.0–0.1)
Eosinophils Absolute: 0.1 10*3/uL (ref 0.0–0.7)
Eosinophils Relative: 2 % (ref 0–5)
Monocytes Absolute: 0.4 10*3/uL (ref 0.1–1.0)

## 2012-01-20 LAB — CBC
HCT: 35.2 % — ABNORMAL LOW (ref 36.0–46.0)
MCH: 28.7 pg (ref 26.0–34.0)
MCHC: 34.4 g/dL (ref 30.0–36.0)
MCV: 83.4 fL (ref 78.0–100.0)
Platelets: 271 10*3/uL (ref 150–400)
RDW: 13.6 % (ref 11.5–15.5)

## 2012-01-20 LAB — GLUCOSE, CAPILLARY: Glucose-Capillary: 306 mg/dL — ABNORMAL HIGH (ref 70–99)

## 2012-01-20 LAB — BASIC METABOLIC PANEL
CO2: 22 mEq/L (ref 19–32)
Calcium: 9 mg/dL (ref 8.4–10.5)
Creatinine, Ser: 0.67 mg/dL (ref 0.50–1.10)
GFR calc non Af Amer: >90 mL/min (ref >90)
Sodium: 135 mEq/L (ref 135–145)

## 2012-01-20 MED ORDER — SODIUM CHLORIDE 0.9 % IV BOLUS (SEPSIS)
1000.0000 mL | Freq: Once | INTRAVENOUS | Status: AC
  Administered 2012-01-20: 1000 mL via INTRAVENOUS

## 2012-01-20 NOTE — Discharge Instructions (Signed)
Alcohol Intoxication You have alcohol intoxication when the amount of alcohol that you have consumed has impaired your ability to mentally and physically function. There are a variety of factors that contribute to the level at which alcohol intoxication can occur, such as age, gender, weight, frequency of alcohol consumption, medication use, and the presence of other medical conditions, such as diabetes, seizures, or heart conditions. The blood alcohol level test measures the concentration of alcohol in your blood. In most states, your blood alcohol level must be lower than 80 mg/dL (0.34%) to legally drive. However, many dangerous effects of alcohol can occur at much lower levels. Alcohol directly impairs the normal chemical activity of the brain and is said to be a chemical depressant. Alcohol can cause drowsiness, stupor, respiratory failure, and coma. Other physical effects can include headache, vomiting, vomiting of blood, abdominal pain, a fast heartbeat, difficulty breathing, anxiety, and amnesia. Alcohol intoxication can also lead to dangerous and life-threatening activities, such as fighting, dangerous operation of vehicles or heavy machinery, and risky sexual behavior. Alcohol can be especially dangerous when taken with other drugs. Some of these drugs are:  Sedatives.   Painkillers.   Marijuana.   Tranquilizers.   Antihistamines.   Muscle relaxants.   Seizure medicine.  Many of the effects of acute alcohol intoxication are temporary. However, repeated alcohol intoxication can lead to severe medical illnesses. If you have alcohol intoxication, you should:  Stay hydrated. Drink enough water and fluids to keep your urine clear or pale yellow. Avoid excessive caffeine because this can further lead to dehydration.   Eat a healthy diet. You may have residual nausea, headache, and loss of appetite, but it is still important that you maintain good nutrition. You can start with clear  liquids.   Take nonsteroidal anti-inflammatory medications as needed for headaches, but make sure to do so with small meals. You should avoid acetaminophen for several days after having alcohol intoxication because the combination of alcohol and acetaminophen can be toxic to your liver.  If you have frequent alcohol intoxication, ask your friends and family if they think you have a drinking problem. For further help, contact:  Your caregiver.   Alcoholics Anonymous (AA).   A drug or alcohol rehabilitation program.  SEEK MEDICAL CARE IF:   You have persistent vomiting.   You have persistent pain in any part of your body.   You do not feel better after a few days.  SEEK IMMEDIATE MEDICAL CARE IF:   You become shaky or tremble when you try to stop drinking.   You shake uncontrollably (seizure).   You throw up (vomit) blood. This may be bright red or it may look like black coffee grounds.   You have blood in the stool. This may be bright red or appear as a black, tarry, bad smelling stool.   You become lightheaded or faint.  ANY OF THESE SYMPTOMS MAY REPRESENT A SERIOUS PROBLEM THAT IS AN EMERGENCY. Do not wait to see if the symptoms will go away. Get medical help right away. Call your local emergency services (911 in [place] DO NOT drive yourself to the hospital. MAKE SURE YOU:   Understand these instructions.   Will watch your condition.   Will get help right away if you are not doing well or get worse.  Document Released: 05/17/2005 Document Revised: 07/27/2011 Document Reviewed: 01/24/2010 [facility] Patient Information 2012 [place], [place].Alcohol Intoxication You have alcohol intoxication when the amount of alcohol that you have consumed has  impaired your ability to mentally and physically function. There are a variety of factors that contribute to the level at which alcohol intoxication can occur, such as age, gender, weight, frequency of alcohol consumption, medication use, and  the presence of other medical conditions, such as diabetes, seizures, or heart conditions. The blood alcohol level test measures the concentration of alcohol in your blood. In most states, your blood alcohol level must be lower than 80 mg/dL (4.09%) to legally drive. However, many dangerous effects of alcohol can occur at much lower levels. Alcohol directly impairs the normal chemical activity of the brain and is said to be a chemical depressant. Alcohol can cause drowsiness, stupor, respiratory failure, and coma. Other physical effects can include headache, vomiting, vomiting of blood, abdominal pain, a fast heartbeat, difficulty breathing, anxiety, and amnesia. Alcohol intoxication can also lead to dangerous and life-threatening activities, such as fighting, dangerous operation of vehicles or heavy machinery, and risky sexual behavior. Alcohol can be especially dangerous when taken with other drugs. Some of these drugs are:  Sedatives.   Painkillers.   Marijuana.   Tranquilizers.   Antihistamines.   Muscle relaxants.   Seizure medicine.  Many of the effects of acute alcohol intoxication are temporary. However, repeated alcohol intoxication can lead to severe medical illnesses. If you have alcohol intoxication, you should:  Stay hydrated. Drink enough water and fluids to keep your urine clear or pale yellow. Avoid excessive caffeine because this can further lead to dehydration.   Eat a healthy diet. You may have residual nausea, headache, and loss of appetite, but it is still important that you maintain good nutrition. You can start with clear liquids.   Take nonsteroidal anti-inflammatory medications as needed for headaches, but make sure to do so with small meals. You should avoid acetaminophen for several days after having alcohol intoxication because the combination of alcohol and acetaminophen can be toxic to your liver.  If you have frequent alcohol intoxication, ask your friends and  family if they think you have a drinking problem. For further help, contact:  Your caregiver.   Alcoholics Anonymous (AA).   A drug or alcohol rehabilitation program.  SEEK MEDICAL CARE IF:   You have persistent vomiting.   You have persistent pain in any part of your body.   You do not feel better after a few days.  SEEK IMMEDIATE MEDICAL CARE IF:   You become shaky or tremble when you try to stop drinking.   You shake uncontrollably (seizure).   You throw up (vomit) blood. This may be bright red or it may look like black coffee grounds.   You have blood in the stool. This may be bright red or appear as a black, tarry, bad smelling stool.   You become lightheaded or faint.  ANY OF THESE SYMPTOMS MAY REPRESENT A SERIOUS PROBLEM THAT IS AN EMERGENCY. Do not wait to see if the symptoms will go away. Get medical help right away. Call your local emergency services (911 in [place] DO NOT drive yourself to the hospital. MAKE SURE YOU:   Understand these instructions.   Will watch your condition.   Will get help right away if you are not doing well or get worse.  Document Released: 05/17/2005 Document Revised: 07/27/2011 Document Reviewed: 01/24/2010 [facility] Patient Information 2012 [place], [place].

## 2012-01-20 NOTE — ED Notes (Signed)
Pt awake and alert pt given bathing items. Pt called for ride and is ready for discharge.

## 2012-01-20 NOTE — ED Provider Notes (Addendum)
History     CSN: [id number]  Arrival date & time 01/20/12  0223   First MD Initiated Contact with Patient 01/20/12 [phone]      Chief Complaint  Patient presents with  . Hyperglycemia     Patient is a 51 y.o. female presenting with vomiting. The history is provided by the patient.  Emesis  This is a new problem. Episode onset: earlier tonight. The problem occurs 2 to 4 times per day. The problem has been gradually improving. The emesis has an appearance of stomach contents. There has been no fever. Associated symptoms include chills. Pertinent negatives include no abdominal pain and no diarrhea. Risk factors: etoh abuse.  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 Diabetic neuropathy   . Cellulitis   . Thyroid disease   . Hyperlipidemia     not on meds  . Heart murmur   . Arthritis   . Neuromuscular disorder   . Hypertension     hx of;but doesn't require meds anymore;has been off of x 70yr  . Cough   . Pneumonia     hx of;>32yr ago   . Headache     occasionally  . Concussion as a teenager    slight  . Peripheral neuropathy   . Joint pain   . Joint swelling   . Dry skin   . Diarrhea     pt takes Reglan tid  . Urinary frequency   . Urinary urgency   . History of kidney stones   . Nocturia   . Hypothyroidism     takes Synthroid daily  . Diabetes mellitus     Novolog,Levemir,and Lovaza daily  . Depression     takes CYmbalta daily  . Peripheral edema     takes Lasix daily  . MRSA (methicillin resistant staph aureus) culture positive     hx of 2012  . Staph infection 2007     Past Surgical History  Procedure Date  . Amputation   . Leg amputation     right leg amputation below knee  . Toe amputation     left foot only 2 toes remaning.   . Tubal ligation   . Foot surgery     right foot  . Wrist surgery     right tendon  . Dilation and curettage of uterus   . Refractive surgery     left    Family History  Problem Relation Age of Onset  . Hypertension Mother   . Hyperlipidemia Mother   . Hypertension Brother   . Hyperlipidemia Brother   . Cancer Maternal Aunt   . Diabetes Paternal Aunt   . Diabetes Paternal Uncle   . Diabetes Paternal Grandmother   . Anesthesia problems Neg Hx   . Hypotension Neg Hx   . Malignant hyperthermia Neg Hx   . Pseudochol deficiency Neg Hx     History  Substance Use Topics  . Smoking status: Current Everyday Smoker -- 1.0 packs/day for 31 years  . Smokeless tobacco: Never Used  . Alcohol Use: No  OB History    Grav Para Term Preterm Abortions TAB SAB Ect Mult Living                  Review of Systems  Constitutional: Positive for chills.  Gastrointestinal: Positive for vomiting. Negative for abdominal pain and diarrhea.  All other systems reviewed and are negative.    Allergies  Gadolinium; Ivp dye; Metformin; and Penicillins  Home Medications   Current Outpatient Rx  Name Route Sig Dispense Refill  . ASPIRIN 81 MG PO TBEC Oral Take 1 tablet (81 mg total) by mouth daily. 30 tablet 0  . DULOXETINE HCL 60 MG PO CPEP Oral Take 60 mg by mouth every morning.      . FUROSEMIDE 20 MG PO TABS Oral Take 20 mg by mouth daily.     Marland Kitchen GABAPENTIN 800 MG PO TABS Oral Take 800 mg by mouth 3 (three) times daily.     Marland Kitchen GEMFIBROZIL 600 MG PO TABS Oral Take 600 mg by mouth 2 (two) times daily before a meal.    . HYDROCODONE-ACETAMINOPHEN 10-325 MG PO TABS Oral Take 1 tablet by mouth 2 (two) times daily as needed. For pain    . INSULIN ASPART 100 UNIT/ML [place] SOLN Subcutaneous Inject 15 Units into the skin 3 (three)  times daily before meals. Inject 15 units at mealtimes and uses sliding scale as directed by MD    . INSULIN DETEMIR 100 UNIT/ML [place] SOLN Subcutaneous Inject 56 Units into the skin at bedtime.     Marland Kitchen LEVOTHYROXINE SODIUM 25 MCG PO TABS Oral Take 25 mcg by mouth every morning.     Marland Kitchen METOCLOPRAMIDE HCL 5 MG PO TABS Oral Take 5 mg by mouth 3 (three) times daily.     . OMEGA-3-ACID ETHYL ESTERS 1 G PO CAPS Oral Take 2 g by mouth 2 (two) times daily. Take 2 grams at 8am and 2 grams at 4pm.    . OXYBUTYNIN CHLORIDE 5 MG PO TABS Oral Take 5 mg by mouth every morning.      Marland Kitchen TRAMADOL HCL ER 100 MG PO TB24 Oral Take 100 mg by mouth daily.      LMP 12/27/2011 BP 123/90  Pulse 71  Temp(Src) 98.3 F (36.8 C) (Oral)  Resp 18  Ht 5\' 5"  (1.651 m)  Wt 198 lb (89.812 kg)  BMI 32.95 kg/m2  SpO2 95%  LMP 01/10/2012 BP 114/86  Pulse 72  Temp(Src) 98.3 F (36.8 C) (Oral)  Resp 22  Ht 5\' 5"  (1.651 m)  Wt 198 lb (89.812 kg)  BMI 32.95 kg/m2  SpO2 97%  LMP 01/10/2012   Physical Exam CONSTITUTIONAL: Well developed/well nourished HEAD AND FACE: Normocephalic/atraumatic EYES: EOMI/PERRL ENMT: Mucous membranes moist NECK: supple no meningeal signs SPINE:entire spine nontender CV: S1/S2 noted LUNGS: Lungs are clear to auscultation bilaterally, no apparent distress ABDOMEN: soft, nontender, no rebound or guarding GU:no cva tenderness NEURO: Pt is awake/alert, moves all extremitiesx4 Right BKA stump noted- ulceration noted with healing edges that are pink.  Small amt of yellowish discharge but no bleeding, no erythema, no tenderness, no crepitance.   EXTREMITIES: pulses normal, full ROM SKIN: warm, color normal PSYCH: no abnormalities of mood noted  ED Course  Procedures   Labs Reviewed  GLUCOSE, CAPILLARY - Abnormal; Notable for the following:    Glucose-Capillary 460 (*)    All other components within normal limits  CBC  DIFFERENTIAL  BASIC METABOLIC PANEL  3:01 AM Pt here for ETOH intox  as well as hyperglycemia Her wound on BKA stump is healing well, and we will repack she is already on abx for this and has wound followup  6:16 AM Glucose improved Pt resting comfortably, no distress, she feels well for d/c home Mental status improved Advised f/u for wound care  The patient appears reasonably screened and/or stabilized for discharge and I doubt any other medical condition or other [facility] requiring further screening, evaluation, or treatment in the ED at this time prior to discharge.      MDM  Nursing notes reviewed and considered in documentation All labs/vitals reviewed and considered Previous records reviewed and considered      Date: 01/20/2012  Rate: 69  Rhythm: normal sinus rhythm  QRS Axis: left  Intervals: normal  ST/T Wave abnormalities: nonspecific ST changes  Conduction Disutrbances:none      Joya Gaskins, MD 01/20/12 1610  Joya Gaskins, MD 02/22/12 [phone]

## 2012-01-20 NOTE — ED Notes (Signed)
Pt remains lethargic and responsive only to verbal stimuli. Monitors intact with close observation.

## 2012-01-20 NOTE — ED Notes (Signed)
Wound packed with sterile technique wet to dry with bacitracin in wound bed per MD order.

## 2012-01-20 NOTE — ED Notes (Signed)
EMS states pt called to scene for elevated bs. States ETOH on board. Pt states she drank 11 to 12 shots tonight. When entering room pts Spo2 72% pt unresponsive with respiratory rate of 5. With vigorous tactile stimulation.

## 2012-01-20 NOTE — ED Notes (Signed)
Pt has bka to right leg undressed per MD. Deep wound noted with large amount of purulent drainage noted. Pt states she is packing herself and is on antibiotics for same.

## 2012-01-23 ENCOUNTER — Other Ambulatory Visit: Payer: Self-pay | Admitting: Internal Medicine

## 2012-01-23 DIAGNOSIS — I739 Peripheral vascular disease, unspecified: Secondary | ICD-10-CM

## 2012-01-24 ENCOUNTER — Encounter (HOSPITAL_BASED_OUTPATIENT_CLINIC_OR_DEPARTMENT_OTHER): Payer: Medicaid Other

## 2012-01-24 DIAGNOSIS — T8789 Other complications of amputation stump: Secondary | ICD-10-CM | POA: Insufficient documentation

## 2012-01-24 DIAGNOSIS — L97809 Non-pressure chronic ulcer of other part of unspecified lower leg with unspecified severity: Secondary | ICD-10-CM | POA: Insufficient documentation

## 2012-01-24 DIAGNOSIS — Y835 Amputation of limb(s) as the cause of abnormal reaction of the patient, or of later complication, without mention of misadventure at the time of the procedure: Secondary | ICD-10-CM | POA: Insufficient documentation

## 2012-02-05 ENCOUNTER — Ambulatory Visit
Admission: RE | Admit: 2012-02-05 | Discharge: 2012-02-05 | Disposition: A | Discharge status: Home / Self Care | Payer: Medicaid Other | Source: Ambulatory Visit | Attending: Internal Medicine | Admitting: Internal Medicine

## 2012-02-05 DIAGNOSIS — I739 Peripheral vascular disease, unspecified: Secondary | ICD-10-CM

## 2012-03-04 ENCOUNTER — Ambulatory Visit: Payer: Medicaid Other | Attending: Orthopedic Surgery | Admitting: Physical Therapy

## 2012-03-04 DIAGNOSIS — R269 Unspecified abnormalities of gait and mobility: Secondary | ICD-10-CM | POA: Insufficient documentation

## 2012-03-04 DIAGNOSIS — M6281 Muscle weakness (generalized): Secondary | ICD-10-CM | POA: Insufficient documentation

## 2012-03-04 DIAGNOSIS — R5381 Other malaise: Secondary | ICD-10-CM | POA: Insufficient documentation

## 2012-03-04 DIAGNOSIS — IMO0001 Reserved for inherently not codable concepts without codable children: Secondary | ICD-10-CM | POA: Insufficient documentation

## 2012-03-04 DIAGNOSIS — S88119A Complete traumatic amputation at level between knee and ankle, unspecified lower leg, initial encounter: Secondary | ICD-10-CM | POA: Insufficient documentation

## 2012-03-12 ENCOUNTER — Ambulatory Visit: Payer: Medicaid Other | Admitting: Physical Therapy

## 2012-03-20 ENCOUNTER — Ambulatory Visit: Payer: Medicaid Other | Admitting: Physical Therapy

## 2012-07-02 IMAGING — CR DG CHEST 2V
2 series · 2 of 2 positions shown · non-contrast
Comparison: 10/14/2001 study report

CLINICAL DATA: History of ulceration.  History of diabetes.  Pain
in the upper left chest area radiating into the left arm.

CHEST - 2 VIEW

[w chest pa]
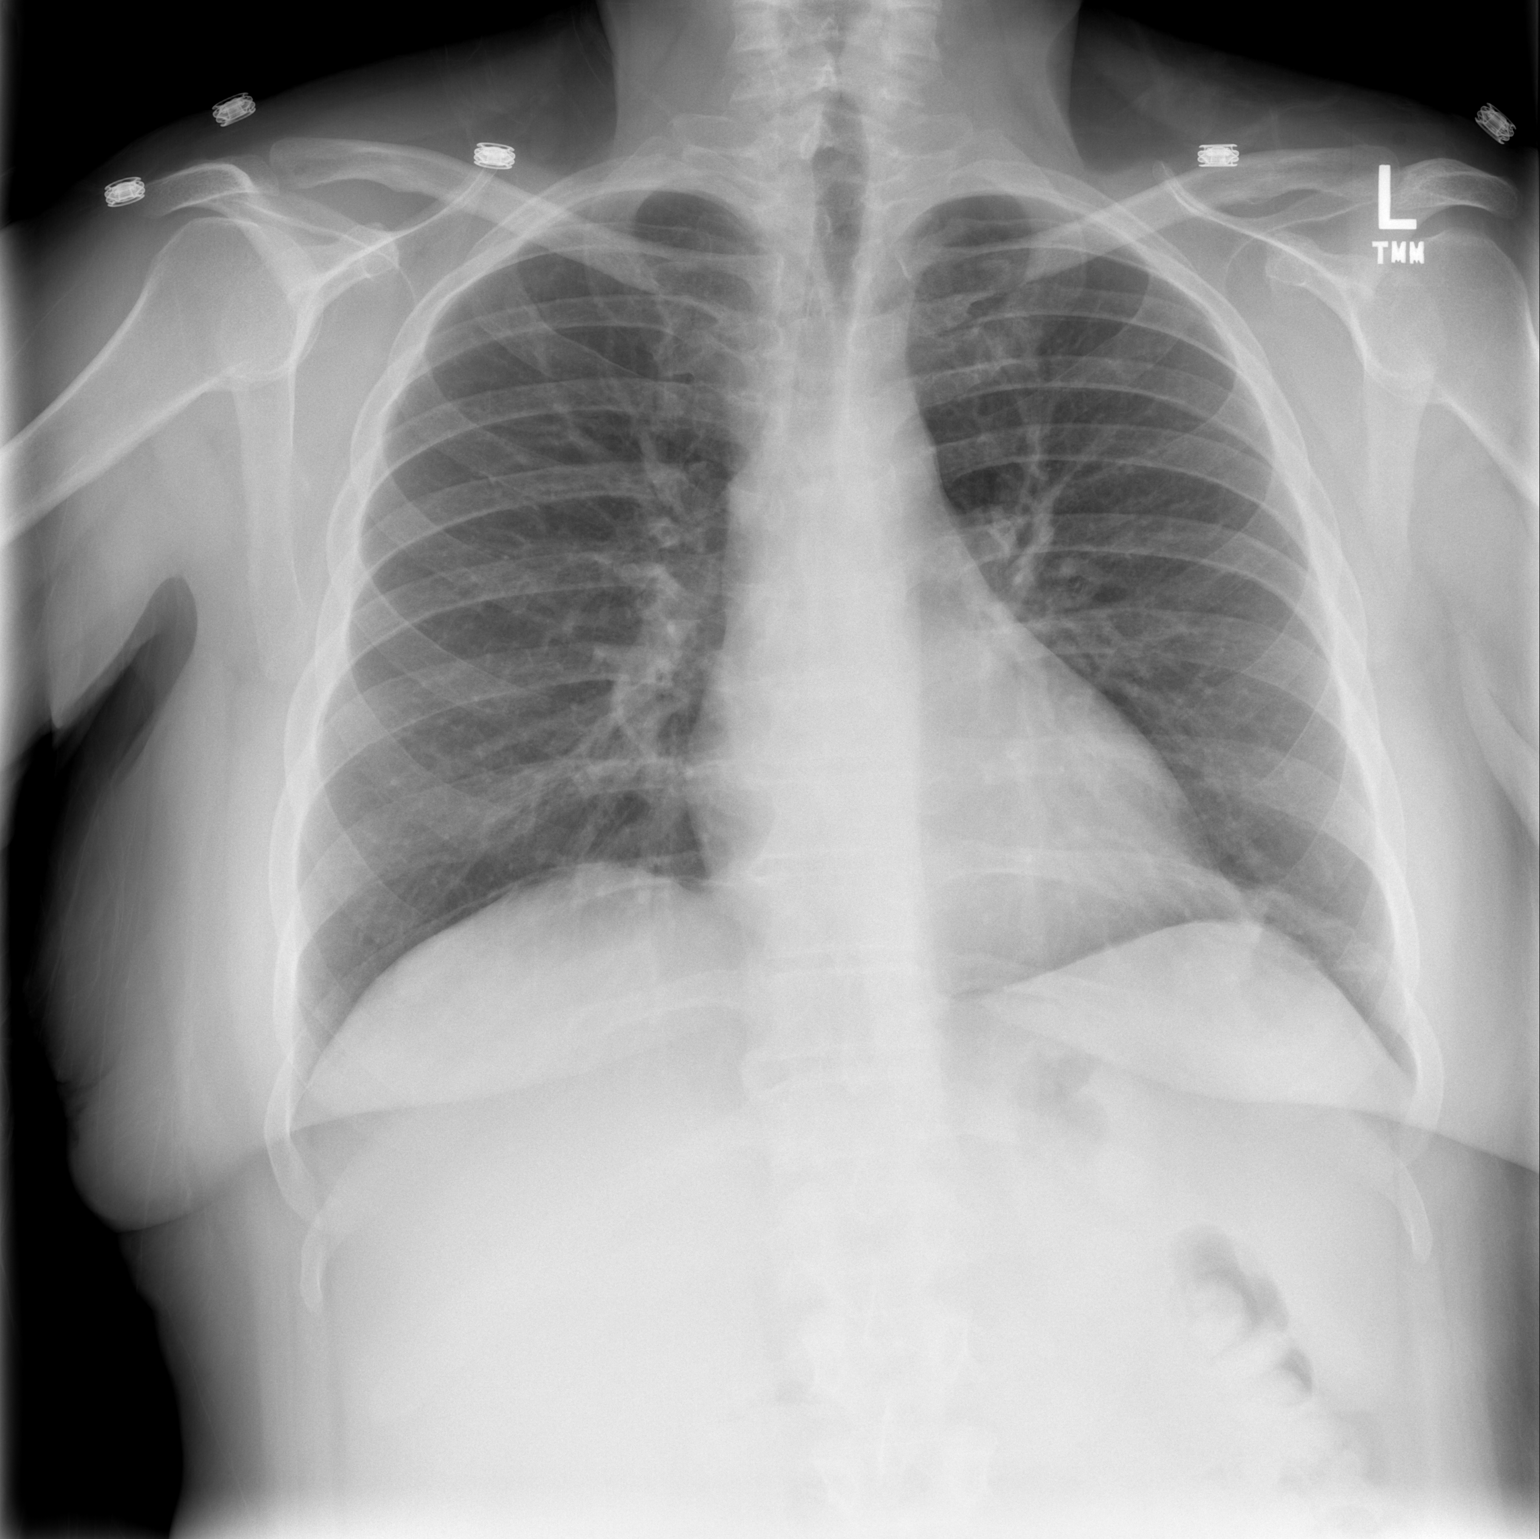

[w chest lat]
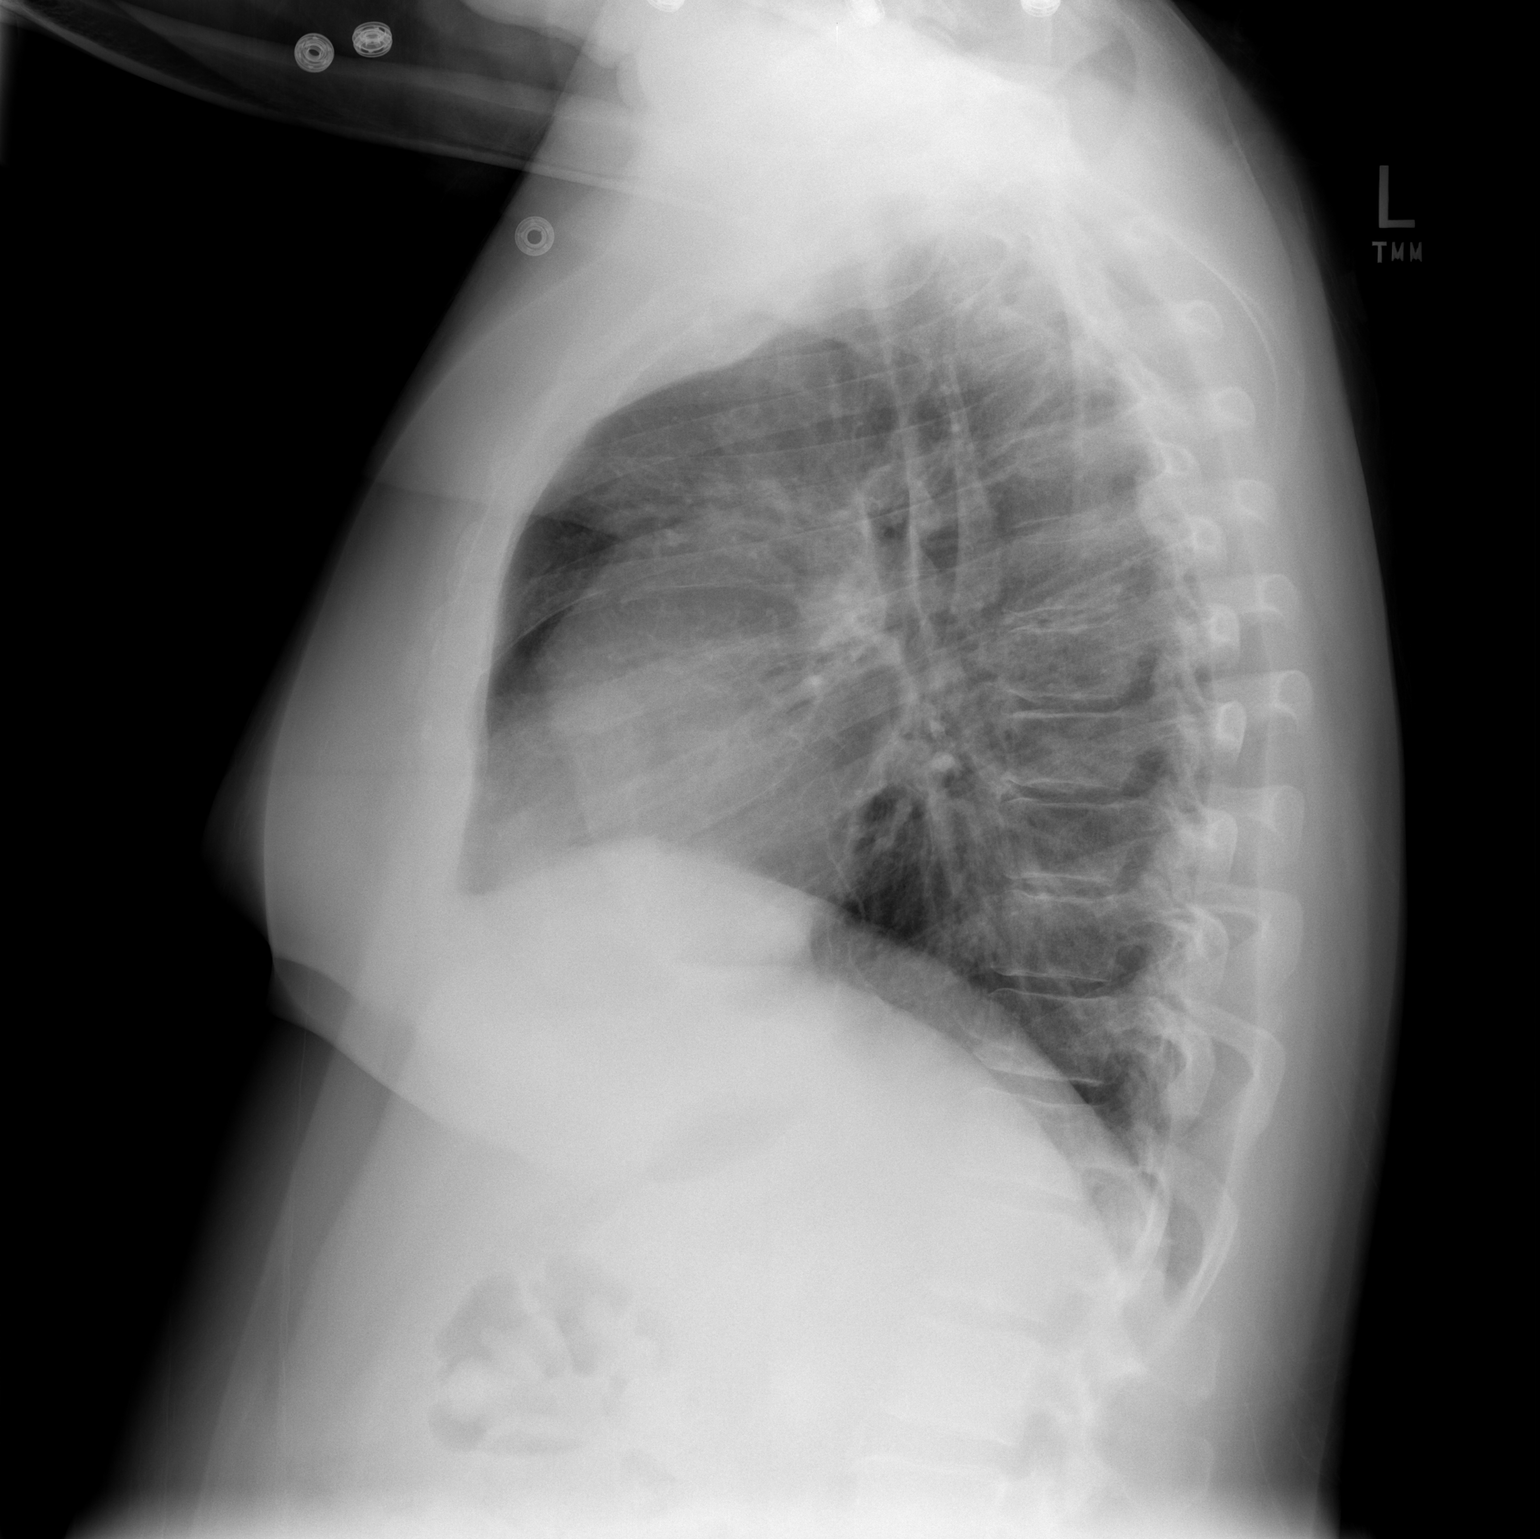

[2 of 2 positions shown; findings below may reference images not displayed]

FINDINGS: The cardiac silhouette is normal size and shape. The
lungs are well aerated and free of infiltrates. No pleural
abnormality is evident. Osteophytes are seen in the spine.
IMPRESSION: No acute or active cardiopulmonary process is identified.

## 2012-08-12 ENCOUNTER — Other Ambulatory Visit: Payer: Self-pay | Admitting: Internal Medicine

## 2012-08-12 DIAGNOSIS — Z1231 Encounter for screening mammogram for malignant neoplasm of breast: Secondary | ICD-10-CM

## 2012-08-21 HISTORY — PX: REFRACTIVE SURGERY: SHX103

## 2012-08-28 ENCOUNTER — Ambulatory Visit (HOSPITAL_COMMUNITY): Payer: Medicaid Other

## 2012-08-28 IMAGING — US IR FLUORO GUIDE CV LINE*R*
1 series · 2 of 2 positions shown · non-contrast
Comparison: none

CLINICAL DATA: Infection of the right foot in need of long-term
antibiotics.  Request  has been made for PICC line placement.

[Series 1: ir fluoro guide cv line*right* · 2 of 2 slices shown]
[im 1/2]
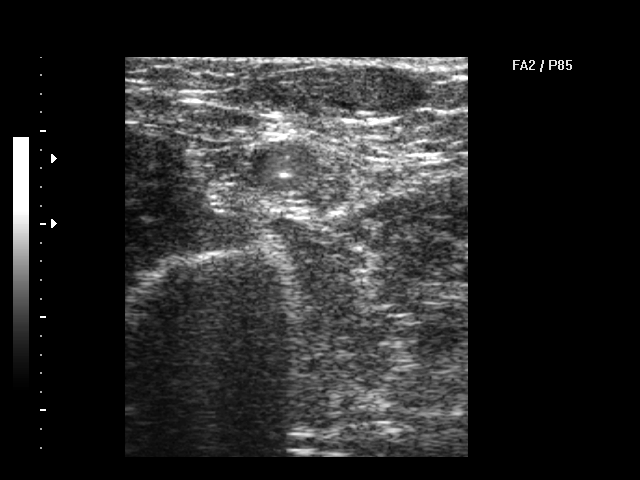
[im 2/2]
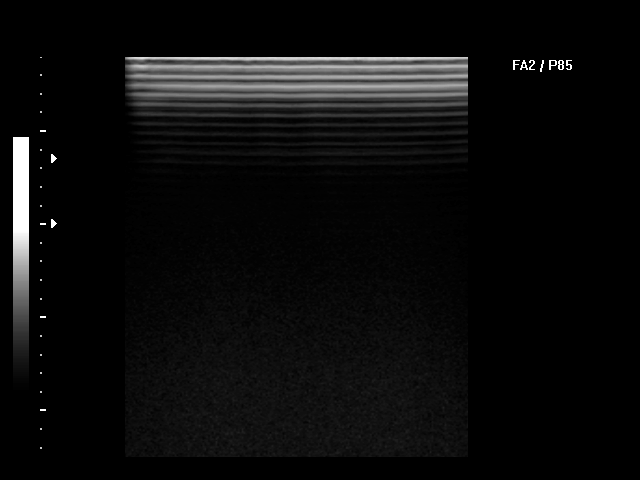

[2 of 2 positions shown; findings below may reference images not displayed]

PICC LINE PLACEMENT WITH ULTRASOUND AND FLUOROSCOPIC  GUIDANCE

Fluoroscopy Time: 0.2 minutes.

The right arm was prepped with chlorhexidine, draped in the usual
sterile fashion using maximum barrier technique (cap and mask,
sterile gown, sterile gloves, large sterile sheet, hand hygiene and
cutaneous antisepsis) and infiltrated locally with 1% Lidocaine.

Ultrasound demonstrated patency of the right brachial vein, and
this was documented with an image.  Under real-time ultrasound
guidance, this vein was accessed with a 21 gauge micropuncture
needle and image documentation was performed.  The needle was
exchanged over a guidewire for a peel-away sheath through which a
five French single lumen PICC trimmed to 37 cm was advanced,
positioned with its tip at the lower SVC/right atrial junction.
Fluoroscopy during the procedure and fluoro spot radiograph
confirms appropriate catheter position.  The catheter was flushed,
secured to the skin with Prolene sutures, and covered with a
sterile dressing.

Complications:  None
IMPRESSION: Successful right arm PICC line placement with ultrasound and
fluoroscopic guidance.  The catheter is ready for use.

Read by: Jumper, Nya.-KIRTOAKEH

## 2012-09-05 ENCOUNTER — Ambulatory Visit (HOSPITAL_COMMUNITY)
Admission: RE | Admit: 2012-09-05 | Discharge: 2012-09-05 | Disposition: A | Discharge status: Home / Self Care | Payer: Medicaid Other | Source: Ambulatory Visit | Attending: Internal Medicine | Admitting: Internal Medicine

## 2012-09-05 DIAGNOSIS — Z1231 Encounter for screening mammogram for malignant neoplasm of breast: Secondary | ICD-10-CM | POA: Insufficient documentation

## 2012-12-05 ENCOUNTER — Inpatient Hospital Stay (HOSPITAL_COMMUNITY)
Admission: EM | Admit: 2012-12-05 | Discharge: 2012-12-07 | DRG: 638 | Disposition: A | Discharge status: Home / Self Care | Payer: Medicaid Other | Attending: Family Medicine | Admitting: Family Medicine

## 2012-12-05 ENCOUNTER — Emergency Department (HOSPITAL_COMMUNITY): Payer: Medicaid Other

## 2012-12-05 ENCOUNTER — Encounter (HOSPITAL_COMMUNITY): Payer: Self-pay | Admitting: Emergency Medicine

## 2012-12-05 DIAGNOSIS — B3731 Acute candidiasis of vulva and vagina: Secondary | ICD-10-CM

## 2012-12-05 DIAGNOSIS — E118 Type 2 diabetes mellitus with unspecified complications: Secondary | ICD-10-CM | POA: Diagnosis present

## 2012-12-05 DIAGNOSIS — Z87442 Personal history of urinary calculi: Secondary | ICD-10-CM

## 2012-12-05 DIAGNOSIS — Z8614 Personal history of Methicillin resistant Staphylococcus aureus infection: Secondary | ICD-10-CM

## 2012-12-05 DIAGNOSIS — L97509 Non-pressure chronic ulcer of other part of unspecified foot with unspecified severity: Secondary | ICD-10-CM

## 2012-12-05 DIAGNOSIS — S4980XA Other specified injuries of shoulder and upper arm, unspecified arm, initial encounter: Secondary | ICD-10-CM

## 2012-12-05 DIAGNOSIS — E1100 Type 2 diabetes mellitus with hyperosmolarity without nonketotic hyperglycemic-hyperosmolar coma (NKHHC): Principal | ICD-10-CM

## 2012-12-05 DIAGNOSIS — R799 Abnormal finding of blood chemistry, unspecified: Secondary | ICD-10-CM

## 2012-12-05 DIAGNOSIS — S88119A Complete traumatic amputation at level between knee and ankle, unspecified lower leg, initial encounter: Secondary | ICD-10-CM

## 2012-12-05 DIAGNOSIS — R3915 Urgency of urination: Secondary | ICD-10-CM

## 2012-12-05 DIAGNOSIS — B373 Candidiasis of vulva and vagina: Secondary | ICD-10-CM

## 2012-12-05 DIAGNOSIS — Z888 Allergy status to other drugs, medicaments and biological substances status: Secondary | ICD-10-CM

## 2012-12-05 DIAGNOSIS — E119 Type 2 diabetes mellitus without complications: Secondary | ICD-10-CM

## 2012-12-05 DIAGNOSIS — R739 Hyperglycemia, unspecified: Secondary | ICD-10-CM

## 2012-12-05 DIAGNOSIS — S46909A Unspecified injury of unspecified muscle, fascia and tendon at shoulder and upper arm level, unspecified arm, initial encounter: Secondary | ICD-10-CM

## 2012-12-05 DIAGNOSIS — Z91041 Radiographic dye allergy status: Secondary | ICD-10-CM

## 2012-12-05 DIAGNOSIS — E039 Hypothyroidism, unspecified: Secondary | ICD-10-CM | POA: Diagnosis present

## 2012-12-05 DIAGNOSIS — F3289 Other specified depressive episodes: Secondary | ICD-10-CM

## 2012-12-05 DIAGNOSIS — E1165 Type 2 diabetes mellitus with hyperglycemia: Secondary | ICD-10-CM | POA: Diagnosis present

## 2012-12-05 DIAGNOSIS — F17200 Nicotine dependence, unspecified, uncomplicated: Secondary | ICD-10-CM

## 2012-12-05 DIAGNOSIS — M129 Arthropathy, unspecified: Secondary | ICD-10-CM | POA: Diagnosis present

## 2012-12-05 DIAGNOSIS — E7800 Pure hypercholesterolemia, unspecified: Secondary | ICD-10-CM

## 2012-12-05 DIAGNOSIS — R7309 Other abnormal glucose: Secondary | ICD-10-CM

## 2012-12-05 DIAGNOSIS — R609 Edema, unspecified: Secondary | ICD-10-CM

## 2012-12-05 DIAGNOSIS — G609 Hereditary and idiopathic neuropathy, unspecified: Secondary | ICD-10-CM

## 2012-12-05 DIAGNOSIS — I10 Essential (primary) hypertension: Secondary | ICD-10-CM | POA: Diagnosis present

## 2012-12-05 DIAGNOSIS — G709 Myoneural disorder, unspecified: Secondary | ICD-10-CM | POA: Diagnosis present

## 2012-12-05 DIAGNOSIS — Z880 Allergy status to penicillin: Secondary | ICD-10-CM

## 2012-12-05 DIAGNOSIS — S98139A Complete traumatic amputation of one unspecified lesser toe, initial encounter: Secondary | ICD-10-CM

## 2012-12-05 DIAGNOSIS — E1169 Type 2 diabetes mellitus with other specified complication: Secondary | ICD-10-CM | POA: Diagnosis present

## 2012-12-05 DIAGNOSIS — N179 Acute kidney failure, unspecified: Secondary | ICD-10-CM

## 2012-12-05 DIAGNOSIS — F329 Major depressive disorder, single episode, unspecified: Secondary | ICD-10-CM | POA: Diagnosis present

## 2012-12-05 DIAGNOSIS — L84 Corns and callosities: Secondary | ICD-10-CM

## 2012-12-05 DIAGNOSIS — E1149 Type 2 diabetes mellitus with other diabetic neurological complication: Secondary | ICD-10-CM | POA: Diagnosis present

## 2012-12-05 DIAGNOSIS — E1101 Type 2 diabetes mellitus with hyperosmolarity with coma: Secondary | ICD-10-CM

## 2012-12-05 DIAGNOSIS — G9340 Encephalopathy, unspecified: Secondary | ICD-10-CM

## 2012-12-05 DIAGNOSIS — E871 Hypo-osmolality and hyponatremia: Secondary | ICD-10-CM

## 2012-12-05 DIAGNOSIS — E1142 Type 2 diabetes mellitus with diabetic polyneuropathy: Secondary | ICD-10-CM | POA: Diagnosis present

## 2012-12-05 DIAGNOSIS — E875 Hyperkalemia: Secondary | ICD-10-CM

## 2012-12-05 DIAGNOSIS — E785 Hyperlipidemia, unspecified: Secondary | ICD-10-CM | POA: Diagnosis present

## 2012-12-05 DIAGNOSIS — IMO0002 Reserved for concepts with insufficient information to code with codable children: Secondary | ICD-10-CM | POA: Diagnosis present

## 2012-12-05 DIAGNOSIS — Z794 Long term (current) use of insulin: Secondary | ICD-10-CM

## 2012-12-05 DIAGNOSIS — M869 Osteomyelitis, unspecified: Secondary | ICD-10-CM

## 2012-12-05 DIAGNOSIS — L989 Disorder of the skin and subcutaneous tissue, unspecified: Secondary | ICD-10-CM

## 2012-12-05 DIAGNOSIS — Z79899 Other long term (current) drug therapy: Secondary | ICD-10-CM

## 2012-12-05 DIAGNOSIS — N390 Urinary tract infection, site not specified: Secondary | ICD-10-CM

## 2012-12-05 LAB — HEPATIC FUNCTION PANEL
ALT: 16 U/L (ref 0–35)
Total Protein: 7.2 g/dL (ref 6.0–8.3)

## 2012-12-05 LAB — BLOOD GAS, ARTERIAL
Bicarbonate: 26.5 mEq/L — ABNORMAL HIGH (ref 20.0–24.0)
FIO2: 0.21 %
Patient temperature: 98.6
TCO2: 28.2 mmol/L (ref 0–100)
pCO2 arterial: 54 mmHg — ABNORMAL HIGH (ref 35.0–45.0)
pH, Arterial: 7.311 — ABNORMAL LOW (ref 7.350–7.450)
pO2, Arterial: 102 mmHg — ABNORMAL HIGH (ref 80.0–100.0)

## 2012-12-05 LAB — URINALYSIS, ROUTINE W REFLEX MICROSCOPIC
Leukocytes, UA: NEGATIVE
Nitrite: POSITIVE — AB
Specific Gravity, Urine: 1.026 (ref 1.005–1.030)
Urobilinogen, UA: 0.2 mg/dL (ref 0.0–1.0)

## 2012-12-05 LAB — POCT I-STAT 3, VENOUS BLOOD GAS (G3P V)
O2 Saturation: 29 %
pCO2, Ven: 66.3 mmHg — ABNORMAL HIGH (ref 45.0–50.0)
pH, Ven: 7.296 (ref 7.250–7.300)

## 2012-12-05 LAB — GLUCOSE, CAPILLARY
Glucose-Capillary: 111 mg/dL — ABNORMAL HIGH (ref 70–99)
Glucose-Capillary: 122 mg/dL — ABNORMAL HIGH (ref 70–99)
Glucose-Capillary: 220 mg/dL — ABNORMAL HIGH (ref 70–99)
Glucose-Capillary: 225 mg/dL — ABNORMAL HIGH (ref 70–99)
Glucose-Capillary: 284 mg/dL — ABNORMAL HIGH (ref 70–99)
Glucose-Capillary: 453 mg/dL — ABNORMAL HIGH (ref 70–99)
Glucose-Capillary: 479 mg/dL — ABNORMAL HIGH (ref 70–99)
Glucose-Capillary: 508 mg/dL — ABNORMAL HIGH (ref 70–99)

## 2012-12-05 LAB — CBC
HCT: 39.4 % (ref 36.0–46.0)
Hemoglobin: 13.8 g/dL (ref 12.0–15.0)
MCH: 29.1 pg (ref 26.0–34.0)
MCV: 83.1 fL (ref 78.0–100.0)
Platelets: 209 10*3/uL (ref 150–400)
RBC: 4.74 MIL/uL (ref 3.87–5.11)
WBC: 8.4 10*3/uL (ref 4.0–10.5)

## 2012-12-05 LAB — BASIC METABOLIC PANEL
BUN: 33 mg/dL — ABNORMAL HIGH (ref 6–23)
CO2: 26 mEq/L (ref 19–32)
CO2: 27 mEq/L (ref 19–32)
CO2: 28 mEq/L (ref 19–32)
Calcium: 8.5 mg/dL (ref 8.4–10.5)
Calcium: 8.4 mg/dL (ref 8.4–10.5)
Chloride: 101 mEq/L (ref 96–112)
Chloride: 103 mEq/L (ref 96–112)
Chloride: 104 mEq/L (ref 96–112)
Creatinine, Ser: 0.79 mg/dL (ref 0.50–1.10)
GFR calc Af Amer: 86 mL/min — ABNORMAL LOW (ref >90)
GFR calc Af Amer: >90 mL/min (ref >90)
GFR calc Af Amer: >90 mL/min (ref >90)
GFR calc non Af Amer: 74 mL/min — ABNORMAL LOW (ref >90)
GFR calc non Af Amer: 79 mL/min — ABNORMAL LOW (ref >90)
GFR calc non Af Amer: 84 mL/min — ABNORMAL LOW (ref >90)
Glucose, Bld: 374 mg/dL — ABNORMAL HIGH (ref 70–99)
Glucose, Bld: 271 mg/dL — ABNORMAL HIGH (ref 70–99)
Glucose, Bld: 236 mg/dL — ABNORMAL HIGH (ref 70–99)
Potassium: 4.7 mEq/L (ref 3.5–5.1)
Potassium: 3.9 mEq/L (ref 3.5–5.1)
Potassium: 4.1 mEq/L (ref 3.5–5.1)
Potassium: 4.4 mEq/L (ref 3.5–5.1)
Potassium: 4.2 mEq/L (ref 3.5–5.1)
Sodium: 130 mEq/L — ABNORMAL LOW (ref 135–145)
Sodium: 133 mEq/L — ABNORMAL LOW (ref 135–145)
Sodium: 136 mEq/L (ref 135–145)
Sodium: 139 mEq/L (ref 135–145)

## 2012-12-05 LAB — POCT I-STAT TROPONIN I

## 2012-12-05 LAB — CBC WITH DIFFERENTIAL/PLATELET
Basophils Absolute: 0 10*3/uL (ref 0.0–0.1)
Eosinophils Absolute: 0.2 10*3/uL (ref 0.0–0.7)
Hemoglobin: 15.5 g/dL — ABNORMAL HIGH (ref 12.0–15.0)
Lymphocytes Relative: 36 % (ref 12–46)
MCHC: 36.4 g/dL — ABNORMAL HIGH (ref 30.0–36.0)
Monocytes Relative: 11 % (ref 3–12)
Neutrophils Relative %: 51 % (ref 43–77)
RDW: 13.2 % (ref 11.5–15.5)
WBC: 8.5 10*3/uL (ref 4.0–10.5)

## 2012-12-05 LAB — URINE MICROSCOPIC-ADD ON

## 2012-12-05 MED ORDER — NALOXONE HCL 0.4 MG/ML IJ SOLN
INTRAMUSCULAR | Status: AC
  Filled 2012-12-05: qty 1

## 2012-12-05 MED ORDER — GABAPENTIN 800 MG PO TABS
800.0000 mg | ORAL_TABLET | Freq: Three times a day (TID) | ORAL | Status: DC
  Filled 2012-12-05 (×3): qty 1

## 2012-12-05 MED ORDER — ONDANSETRON HCL 4 MG/2ML IJ SOLN: 4.0000 mg | Freq: Four times a day (QID) | INTRAMUSCULAR | Status: DC | PRN

## 2012-12-05 MED ORDER — DEXTROSE-NACL 5-0.45 % IV SOLN: INTRAVENOUS | Status: DC

## 2012-12-05 MED ORDER — SODIUM CHLORIDE 0.9 % IV SOLN: INTRAVENOUS | Status: DC

## 2012-12-05 MED ORDER — DEXTROSE-NACL 5-0.45 % IV SOLN
INTRAVENOUS | Status: DC
  Administered 2012-12-05: 12:00:00 via INTRAVENOUS

## 2012-12-05 MED ORDER — ENOXAPARIN SODIUM 40 MG/0.4ML ~~LOC~~ SOLN
40.0000 mg | SUBCUTANEOUS | Status: DC
  Administered 2012-12-05 – 2012-12-07 (×3): 40 mg via SUBCUTANEOUS
  Filled 2012-12-05 (×4): qty 0.4

## 2012-12-05 MED ORDER — DIPHENHYDRAMINE HCL 50 MG/ML IJ SOLN
25.0000 mg | Freq: Once | INTRAMUSCULAR | Status: AC
  Administered 2012-12-05: 25 mg via INTRAVENOUS
  Filled 2012-12-05: qty 1

## 2012-12-05 MED ORDER — SODIUM CHLORIDE 0.9 % IV SOLN
1000.0000 mL | Freq: Once | INTRAVENOUS | Status: AC
  Administered 2012-12-05: 1000 mL via INTRAVENOUS

## 2012-12-05 MED ORDER — SODIUM CHLORIDE 0.9 % IV SOLN
INTRAVENOUS | Status: DC
  Administered 2012-12-05: 05:00:00 via INTRAVENOUS
  Filled 2012-12-05: qty 1

## 2012-12-05 MED ORDER — SODIUM CHLORIDE 0.9 % IV SOLN: INTRAVENOUS | Status: AC

## 2012-12-05 MED ORDER — SODIUM CHLORIDE 0.9 % IV BOLUS (SEPSIS)
1000.0000 mL | Freq: Once | INTRAVENOUS | Status: AC
  Administered 2012-12-05: 1000 mL via INTRAVENOUS

## 2012-12-05 MED ORDER — LEVOTHYROXINE SODIUM 25 MCG PO TABS
25.0000 ug | ORAL_TABLET | ORAL | Status: DC
  Administered 2012-12-06 – 2012-12-07 (×2): 25 ug via ORAL
  Filled 2012-12-05 (×5): qty 1

## 2012-12-05 MED ORDER — DEXTROSE 50 % IV SOLN: 25.0000 mL | INTRAVENOUS | Status: DC | PRN

## 2012-12-05 MED ORDER — METOCLOPRAMIDE HCL 5 MG PO TABS
5.0000 mg | ORAL_TABLET | Freq: Three times a day (TID) | ORAL | Status: DC
  Administered 2012-12-05 – 2012-12-07 (×6): 5 mg via ORAL
  Filled 2012-12-05 (×9): qty 1

## 2012-12-05 MED ORDER — DEXTROSE 5 % IV SOLN: 1.0000 g | INTRAVENOUS | Status: DC

## 2012-12-05 MED ORDER — GABAPENTIN 400 MG PO CAPS
800.0000 mg | ORAL_CAPSULE | Freq: Three times a day (TID) | ORAL | Status: DC
  Administered 2012-12-05 – 2012-12-07 (×6): 800 mg via ORAL
  Filled 2012-12-05 (×9): qty 2

## 2012-12-05 MED ORDER — NALOXONE HCL 0.4 MG/ML IJ SOLN
0.4000 mg | INTRAMUSCULAR | Status: DC | PRN
  Administered 2012-12-05 (×2): 0.4 mg via INTRAVENOUS
  Filled 2012-12-05: qty 1

## 2012-12-05 MED ORDER — NALOXONE HCL 0.4 MG/ML IJ SOLN
0.4000 mg | Freq: Once | INTRAMUSCULAR | Status: AC
  Administered 2012-12-05: 0.4 mg via INTRAVENOUS

## 2012-12-05 MED ORDER — PANTOPRAZOLE SODIUM 40 MG PO TBEC
40.0000 mg | DELAYED_RELEASE_TABLET | Freq: Every day | ORAL | Status: DC
  Administered 2012-12-06 – 2012-12-07 (×2): 40 mg via ORAL
  Filled 2012-12-05 (×2): qty 1

## 2012-12-05 MED ORDER — HYDROCHLOROTHIAZIDE 12.5 MG PO CAPS
12.5000 mg | ORAL_CAPSULE | Freq: Every day | ORAL | Status: DC
  Administered 2012-12-06 – 2012-12-07 (×2): 12.5 mg via ORAL
  Filled 2012-12-05 (×3): qty 1

## 2012-12-05 MED ORDER — ATORVASTATIN CALCIUM 80 MG PO TABS
80.0000 mg | ORAL_TABLET | Freq: Every day | ORAL | Status: DC
  Administered 2012-12-06 – 2012-12-07 (×2): 80 mg via ORAL
  Filled 2012-12-05 (×3): qty 1

## 2012-12-05 MED ORDER — INSULIN DETEMIR 100 UNIT/ML ~~LOC~~ SOLN
25.0000 [IU] | Freq: Two times a day (BID) | SUBCUTANEOUS | Status: DC
  Administered 2012-12-05 – 2012-12-06 (×2): 25 [IU] via SUBCUTANEOUS
  Filled 2012-12-05 (×3): qty 0.25

## 2012-12-05 MED ORDER — DULOXETINE HCL 60 MG PO CPEP
60.0000 mg | ORAL_CAPSULE | ORAL | Status: DC
  Filled 2012-12-05 (×2): qty 1

## 2012-12-05 MED ORDER — SODIUM CHLORIDE 0.9 % IV SOLN
1000.0000 mL | INTRAVENOUS | Status: DC
  Administered 2012-12-05: 1000 mL via INTRAVENOUS

## 2012-12-05 MED ORDER — POTASSIUM CHLORIDE 10 MEQ/100ML IV SOLN
10.0000 meq | INTRAVENOUS | Status: AC
  Administered 2012-12-05 (×2): 10 meq via INTRAVENOUS
  Filled 2012-12-05 (×2): qty 100

## 2012-12-05 MED ORDER — DEXTROSE 5 % IV SOLN
1.0000 g | INTRAVENOUS | Status: DC
  Administered 2012-12-06 – 2012-12-07 (×2): 1 g via INTRAVENOUS
  Filled 2012-12-05 (×3): qty 10

## 2012-12-05 MED ORDER — MORPHINE SULFATE 4 MG/ML IJ SOLN
4.0000 mg | Freq: Once | INTRAMUSCULAR | Status: AC
  Administered 2012-12-05: 4 mg via INTRAVENOUS
  Filled 2012-12-05: qty 1

## 2012-12-05 MED ORDER — SODIUM CHLORIDE 0.9 % IV SOLN
INTRAVENOUS | Status: DC
  Administered 2012-12-05: 10:00:00 via INTRAVENOUS

## 2012-12-05 MED ORDER — OXYBUTYNIN CHLORIDE 5 MG PO TABS
5.0000 mg | ORAL_TABLET | ORAL | Status: DC
  Administered 2012-12-06 – 2012-12-07 (×2): 5 mg via ORAL
  Filled 2012-12-05 (×5): qty 1

## 2012-12-05 MED ORDER — DEXTROSE 5 % IV SOLN
1.0000 g | Freq: Once | INTRAVENOUS | Status: AC
  Administered 2012-12-05: 1 g via INTRAVENOUS
  Filled 2012-12-05: qty 10

## 2012-12-05 MED ORDER — SODIUM CHLORIDE 0.9 % IV SOLN
INTRAVENOUS | Status: DC
  Administered 2012-12-05: 13.8 [IU]/h via INTRAVENOUS
  Administered 2012-12-05: 11.6 [IU]/h via INTRAVENOUS
  Administered 2012-12-05: 12.8 [IU]/h via INTRAVENOUS
  Administered 2012-12-05: 4.3 [IU]/h via INTRAVENOUS
  Administered 2012-12-05: 18:00:00 via INTRAVENOUS
  Administered 2012-12-05: 10 [IU]/h via INTRAVENOUS
  Administered 2012-12-06: 2.4 [IU]/h via INTRAVENOUS
  Administered 2012-12-06: 02:00:00 via INTRAVENOUS
  Filled 2012-12-05: qty 1

## 2012-12-05 NOTE — ED Provider Notes (Addendum)
History     CSN: [id number]  Arrival date & time 12/05/12  4098   First MD Initiated Contact with Patient 12/05/12 [phone]      Chief Complaint  Patient presents with  . Hyperglycemia    (Consider location/radiation/quality/duration/timing/severity/associated sxs/prior treatment) HPI Comments: Ptwith iddm, HTN comes in with cc of elevated sugar. Pt states that her sugars have been high since y'day, despite her taking her meds. She has no headaches, no chest pain, dib, new cough, no n/v/f/c. Pt does endorse some upper quadrant abd pain, bilateral - however, the crampy type pain is not new. There is no uti like sx. Pt does indicate having perirectal tenderness.  The history is provided by the patient and medical records.    Past Medical History  Diagnosis Date  . Diabetic neuropathy   . Cellulitis   . Thyroid disease   . Hyperlipidemia     not on meds  . Heart murmur   . Arthritis   . Neuromuscular disorder   . Hypertension     hx of;but doesn't require meds anymore;has been off of x 32yr  . Cough   . Pneumonia     hx of;>42yr ago   . Headache     occasionally  . Concussion as a teenager    slight  . Peripheral neuropathy   . Joint pain   . Joint swelling   . Dry skin   . Diarrhea     pt takes Reglan tid  . Urinary frequency   . Urinary urgency   . History of kidney stones   . Nocturia   . Hypothyroidism     takes Synthroid daily  . Diabetes mellitus     Novolog,Levemir,and Lovaza daily  . Depression     takes CYmbalta daily  . Peripheral edema     takes Lasix daily  . MRSA (methicillin resistant staph aureus) culture positive     hx of 2012  . Staph infection 2007    Past Surgical History  Procedure Laterality Date  . Amputation    . Leg amputation      right leg amputation below knee  . Toe amputation      left foot only 2 toes remaning.   . Tubal ligation    . Foot surgery      right foot  . Wrist surgery      right tendon  . Dilation and  curettage of uterus    . Refractive surgery      left    Family History  Problem Relation Age of Onset  . Hypertension Mother   . Hyperlipidemia Mother   . Hypertension Brother   . Hyperlipidemia Brother   . Cancer Maternal Aunt   . Diabetes Paternal Aunt   . Diabetes Paternal Uncle   . Diabetes Paternal Grandmother   . Anesthesia problems Neg Hx   . Hypotension Neg Hx   . Malignant hyperthermia Neg Hx   . Pseudochol deficiency Neg Hx     History  Substance Use Topics  . Smoking status: Current Every Day Smoker -- 1.00 packs/day for 31 years  . Smokeless tobacco: Never Used  . Alcohol Use: No    OB History   Grav Para Term Preterm Abortions TAB SAB Ect Mult Living                  Review of Systems  Constitutional: Negative for activity change.  HENT: Negative for facial swelling and neck pain.  Respiratory: Negative for cough, shortness of breath and wheezing.   Cardiovascular: Negative for chest pain.  Gastrointestinal: Negative for nausea, vomiting, abdominal pain, diarrhea, constipation, blood in stool and abdominal distention.  Genitourinary: Negative for hematuria and difficulty urinating.  Skin: Positive for rash. Negative for color change.  Neurological: Negative for speech difficulty and light-headedness.  Hematological: Does not bruise/bleed easily.  Psychiatric/Behavioral: Negative for confusion.    Allergies  Gadolinium; Ivp dye; Metformin; and Penicillins  Home Medications   Current Outpatient Rx  Name  Route  Sig  Dispense  Refill  . atorvastatin (LIPITOR) 80 MG tablet   Oral   Take 80 mg by mouth daily.         . DULoxetine (CYMBALTA) 60 MG capsule   Oral   Take 60 mg by mouth every morning.           . gabapentin (NEURONTIN) 800 MG tablet   Oral   Take 800 mg by mouth 3 (three) times daily.          Marland Kitchen gemfibrozil (LOPID) 600 MG tablet   Oral   Take 600 mg by mouth 2 (two) times daily before a meal.         .  hydrochlorothiazide (MICROZIDE) 12.5 MG capsule   Oral   Take 12.5 mg by mouth daily.         Marland Kitchen HYDROcodone-acetaminophen (NORCO) 10-325 MG per tablet   Oral   Take 1 tablet by mouth 2 (two) times daily as needed. For pain         . insulin aspart (NOVOLOG) 100 UNIT/ML injection   Subcutaneous   Inject 40-50 Units into the skin 3 (three) times daily before meals. Per sliding scale         . insulin detemir (LEVEMIR) 100 UNIT/ML injection   Subcutaneous   Inject 50 Units into the skin 2 (two) times daily.          Marland Kitchen levothyroxine (SYNTHROID, LEVOTHROID) 25 MCG tablet   Oral   Take 25 mcg by mouth every morning.          . metoCLOPramide (REGLAN) 5 MG tablet   Oral   Take 5 mg by mouth 3 (three) times daily.          Marland Kitchen omeprazole (PRILOSEC) 20 MG capsule   Oral   Take 20 mg by mouth daily.         Marland Kitchen oxybutynin (DITROPAN) 5 MG tablet   Oral   Take 5 mg by mouth every morning.             BP 147/69  Pulse 73  Temp(Src) 97 F (36.1 C) (Oral)  Resp 14  SpO2 94%  Physical Exam  Nursing note and vitals reviewed. Constitutional: She is oriented to person, place, and time. She appears well-developed and well-nourished.  HENT:  Head: Normocephalic and atraumatic.  Eyes: EOM are normal. Pupils are equal, round, and reactive to light.  Neck: Neck supple.  Cardiovascular: Normal rate and regular rhythm.   Murmur heard. Pulmonary/Chest: Effort normal. No respiratory distress.  Abdominal: Soft. She exhibits no distension. There is no tenderness. There is no rebound and no guarding.  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  Neurological: She is alert and oriented to person, place, and time.  Skin: Skin is warm and dry.  No perirectal abscess, cellulitis    ED Course  Procedures (including critical care time)  Labs Reviewed  GLUCOSE, CAPILLARY - Abnormal;  Notable for the following:    Glucose-Capillary 569 (*)    All other components within  normal limits  CBC WITH DIFFERENTIAL - Abnormal; Notable for the following:    Hemoglobin 15.5 (*)    MCHC 36.4 (*)    All other components within normal limits  BASIC METABOLIC PANEL - Abnormal; Notable for the following:    Sodium 130 (*)    Chloride 90 (*)    Glucose, Bld 629 (*)    BUN 39 (*)    GFR calc non Af Amer 74 (*)    GFR calc Af Amer 86 (*)    All other components within normal limits  HEPATIC FUNCTION PANEL - Abnormal; Notable for the following:    Albumin 3.0 (*)    Alkaline Phosphatase 157 (*)    Total Bilirubin 0.2 (*)    All other components within normal limits  GLUCOSE, CAPILLARY - Abnormal; Notable for the following:    Glucose-Capillary 508 (*)    All other components within normal limits  POCT I-STAT 3, BLOOD GAS (G3P V) - Abnormal; Notable for the following:    pCO2, Ven 66.3 (*)    pO2, Ven 22.0 (*)    Bicarbonate 32.3 (*)    Acid-Base Excess 3.0 (*)    All other components within normal limits  LIPASE, BLOOD  URINALYSIS, ROUTINE W REFLEX MICROSCOPIC  POCT I-STAT TROPONIN I   Dg Chest Portable 1 View  12/05/2012  *RADIOLOGY REPORT*  Clinical Data: Cough; left upper quadrant abdominal pain and cramping.  History of smoking.  PORTABLE CHEST - 1 VIEW  Comparison: Chest radiograph performed 12/11/2011  Findings: The lungs are well-aerated.  Minimal left basilar opacity may reflect atelectasis.  There is no evidence of pleural effusion or pneumothorax.  The cardiomediastinal silhouette is within normal limits.  No acute osseous abnormalities are seen.  IMPRESSION: Minimal left basilar opacity may reflect atelectasis; lungs otherwise clear.   Original Report Authenticated By: Tonia Ghent, M.D.      No diagnosis found.    MDM   Date: 12/05/2012  Rate: 76  Rhythm: normal sinus rhythm  QRS Axis: left  Intervals: normal  ST/T Wave abnormalities: nonspecific ST/T changes  Conduction Disutrbances:none  Narrative Interpretation:   Old EKG Reviewed:  unchanged  Pt comes in with cc of elevated sugar. Hx and exam not suggestive of any specific cause for stess on the body, that would lead to hyperglycemia. Will do a basic infection screen. Will also get ACS labs.  5:47 AM Pt has hyperglycemia, no anion gap Will treat as a DKA. Goal is to get CBG < 300    Derwood Kaplan, MD 12/06/12 2302

## 2012-12-05 NOTE — ED Notes (Signed)
PT. ARRIVED WITH EMS FROM [facility] INTERMITTENT GENERALIZED ABDOMINAL CRAMPING AND HYPERGLYCEMIA THIS MORNING CBG= 569 AT ARRIVAL ,PT. STATES SHE TOOK 48 UNITS OF NOVOLOG INSULIN PTA.

## 2012-12-05 NOTE — ED Notes (Signed)
Attempted report at 67

## 2012-12-05 NOTE — Progress Notes (Signed)
Utilization review completed. Travonta Gill, RN, BSN. 

## 2012-12-05 NOTE — Progress Notes (Signed)
12/05/2012 10:12 AM  Pt received from ED via stretcher at approximately 0900.  Pt is very lethargic; reportedly received pain meds in the ED.  Pt is arousable to sternal rub and when fully awake is completely alert and oriented, but falls back asleep quickly.  Pt states she is from home alone where she takes care of herself.  Pt is a R BKA with prosthesis at the bedside.  Pt also has toes 1-3 on the left foot amputated.  Incisions are well approximated.  Stump to right BKA is slightly reddened but blancheable, no breakdown noted.  Pt does have redness noted to her sacrum which is blancheable.  Pt has dry, flaky patches of skin to all extremities, no drainage noted.  Left OTA.  Pt on 2L of oxygen per nasal cannula due to desaturations in the ED while sleeping and while en route to 6700.  Pt CBG upon arrival 287, insulin drip adjusted accordingly.  Vitals stable--see flowsheet documentation.  MRSA PCR obtained and sent to lab.  See flowsheets for full assessment.  Pt is a high fall risk, yellow arm band and a red sock was applied to non-stump leg.  I explained to patient her fall risk score and what that indicated, however, she is very sleepy at the time so reinforcement will be needed.  Bed alarm placed on.  Pt also was oriented to room/unit, and was instructed on how to utilize the call bell to which she verbalized understanding but will need reinforcement until more fully awake.  Will continue to monitor patient. Eunice Blase

## 2012-12-05 NOTE — H&P (Signed)
[facility] History and Physical  [name] NWG:[id number] DOB: 12-05-1960 DOA: 12/05/2012  Referring physician: Derwood Kaplan, MD  PCP: [name], MD   Chief Complaint: Hyperglycemia  HPI:  52 year old female with a history of insulin-dependent diabetes, previous BKA who presents to the ER because of hyperglycemia. She also describes some intermittent abdominal cramping, CBG was 569 upon arrival. The patient was also found to have a mild urinary tract infection. Patient states that she took 48 units of NovoLog prior to arrival. She also describes some perirectal tenderness, EDP examination did not reveal an abscess.     Review of Systems: negative for the following  Constitutional: Denies fever, chills, diaphoresis, appetite change and fatigue.  HEENT: Denies photophobia, eye pain, redness, hearing loss, ear pain, congestion, sore throat, rhinorrhea, sneezing, mouth sores, trouble swallowing, neck pain, neck stiffness and tinnitus.  Respiratory: Denies SOB, DOE, cough, chest tightness, and wheezing.  Cardiovascular: Denies chest pain, palpitations and leg swelling.  Gastrointestinal: Denies nausea, vomiting, abdominal pain, diarrhea, constipation, blood in stool and abdominal distention.  Genitourinary: Denies dysuria, urgency, frequency, hematuria, flank pain and difficulty urinating.  Musculoskeletal: Denies myalgias, back pain, joint swelling, arthralgias and gait problem.  Skin: Denies pallor, rash and wound.  Neurological: Denies dizziness, seizures, syncope, weakness, light-headedness, numbness and headaches.  Hematological: Denies adenopathy. Easy bruising, personal or family bleeding history  Psychiatric/Behavioral: Denies suicidal ideation, mood changes, confusion, nervousness, sleep disturbance and agitation       Past Medical History  Diagnosis Date  . Diabetic neuropathy   . Cellulitis   . Thyroid disease   . Hyperlipidemia     not on meds  .  Heart murmur   . Arthritis   . Neuromuscular disorder   . Hypertension     hx of;but doesn't require meds anymore;has been off of x 9yr  . Cough   . Pneumonia     hx of;>32yr ago   . Headache     occasionally  . Concussion as a teenager    slight  . Peripheral neuropathy   . Joint pain   . Joint swelling   . Dry skin   . Diarrhea     pt takes Reglan tid  . Urinary frequency   . Urinary urgency   . History of kidney stones   . Nocturia   . Hypothyroidism     takes Synthroid daily  . Diabetes mellitus     Novolog,Levemir,and Lovaza daily  . Depression     takes CYmbalta daily  . Peripheral edema     takes Lasix daily  . MRSA (methicillin resistant staph aureus) culture positive     hx of 2012  . Staph infection 2007     Past Surgical History  Procedure Laterality Date  . Amputation    . Leg amputation      right leg amputation below knee  . Toe amputation      left foot only 2 toes remaning.   . Tubal ligation    . Foot surgery      right foot  . Wrist surgery      right tendon  . Dilation and curettage of uterus    . Refractive surgery      left      Social History:  reports that she has been smoking.  She has never used smokeless tobacco. She reports that she does not drink alcohol or use illicit drugs.    Allergies  Allergen Reactions  .  Gadolinium      Code: VOM, Desc: Pt began vomiting immed post infusion of multihance, Onset Date: [id number]   . Ivp Dye (Iodinated Diagnostic Agents) Nausea And Vomiting  . Metformin     REACTION: diarrhea  . Penicillins Hives    Family History  Problem Relation Age of Onset  . Hypertension Mother   . Hyperlipidemia Mother   . Hypertension Brother   . Hyperlipidemia Brother   . Cancer Maternal Aunt   . Diabetes Paternal Aunt   . Diabetes Paternal Uncle   . Diabetes Paternal Grandmother   . Anesthesia problems Neg Hx   . Hypotension Neg Hx   . Malignant hyperthermia Neg Hx   . Pseudochol deficiency Neg  Hx      Prior to Admission medications   Medication Sig Start Date End Date Taking? Authorizing Provider  atorvastatin (LIPITOR) 80 MG tablet Take 80 mg by mouth daily.   Yes Historical Provider, MD  DULoxetine (CYMBALTA) 60 MG capsule Take 60 mg by mouth every morning.     Yes Historical Provider, MD  gabapentin (NEURONTIN) 800 MG tablet Take 800 mg by mouth 3 (three) times daily.    Yes Historical Provider, MD  gemfibrozil (LOPID) 600 MG tablet Take 600 mg by mouth 2 (two) times daily before a meal.   Yes Historical Provider, MD  hydrochlorothiazide (MICROZIDE) 12.5 MG capsule Take 12.5 mg by mouth daily.   Yes Historical Provider, MD  HYDROcodone-acetaminophen (NORCO) 10-325 MG per tablet Take 1 tablet by mouth 2 (two) times daily as needed. For pain   Yes Historical Provider, MD  insulin aspart (NOVOLOG) 100 UNIT/ML injection Inject 40-50 Units into the skin 3 (three) times daily before meals. Per sliding scale 10/09/11  Yes Belkys A Regalado, MD  insulin detemir (LEVEMIR) 100 UNIT/ML injection Inject 50 Units into the skin 2 (two) times daily.    Yes Historical Provider, MD  levothyroxine (SYNTHROID, LEVOTHROID) 25 MCG tablet Take 25 mcg by mouth every morning.    Yes Historical Provider, MD  metoCLOPramide (REGLAN) 5 MG tablet Take 5 mg by mouth 3 (three) times daily.    Yes Historical Provider, MD  omeprazole (PRILOSEC) 20 MG capsule Take 20 mg by mouth daily.   Yes Historical Provider, MD  oxybutynin (DITROPAN) 5 MG tablet Take 5 mg by mouth every morning.     Yes Historical Provider, MD     Physical Exam: Filed Vitals:   12/05/12 0521 12/05/12 0609 12/05/12 0700 12/05/12 0700  BP: 147/69 157/76 140/65 140/65  Pulse: 73 79 78 78  Temp:      TempSrc:      Resp: 14 14 16 14   SpO2: 94% 94% 82% 92%     Constitutional: Vital signs reviewed. Patient is a well-developed and well-nourished in no acute distress and cooperative with exam. Alert and oriented x3.  Head: Normocephalic and  atraumatic  Ear: TM normal bilaterally  Mouth: no erythema or exudates, MMM  Eyes: PERRL, EOMI, conjunctivae normal, No scleral icterus.  Neck: Supple, Trachea midline normal ROM, No JVD, mass, thyromegaly, or carotid bruit present.  Cardiovascular: RRR, S1 normal, S2 normal, no MRG, pulses symmetric and intact bilaterally  Pulmonary/Chest: CTAB, no wheezes, rales, or rhonchi  Abdominal: Soft. Non-tender, non-distended, bowel sounds are normal, no masses, organomegaly, or guarding present.  GU: no CVA tenderness Musculoskeletal: No joint deformities, erythema, or stiffness, ROM full and no nontender Ext: no edema and no cyanosis, pulses palpable bilaterally (DP and PT)  Hematology:  no cervical, inginal, or axillary adenopathy.  Neurological: A&O x3, Strenght is normal and symmetric bilaterally, cranial nerve II-XII are grossly intact, no focal motor deficit, sensory intact to light touch bilaterally.  Skin: Warm, dry and intact. No rash, cyanosis, or clubbing.  Psychiatric: Normal mood and affect. speech and behavior is normal. Judgment and thought content normal. Cognition and memory are normal.       Labs on Admission:    Basic Metabolic Panel:  Recent Labs Lab 12/05/12 0317  NA 130*  K 4.7  CL 90*  CO2 31  GLUCOSE 629*  BUN 39*  CREATININE 0.89  CALCIUM 9.4   Liver Function Tests:  Recent Labs Lab 12/05/12 0317  AST 23  ALT 16  ALKPHOS 157*  BILITOT 0.2*  PROT 7.2  ALBUMIN 3.0*    Recent Labs Lab 12/05/12 0317  LIPASE 38   No results found for this basename: AMMONIA,  in the last 168 hours CBC:  Recent Labs Lab 12/05/12 0317  WBC 8.5  NEUTROABS 4.3  HGB 15.5*  HCT 42.6  MCV 84.2  PLT 232   Cardiac Enzymes: No results found for this basename: CKTOTAL, CKMB, CKMBINDEX, TROPONINI,  in the last 168 hours  BNP (last 3 results) No results found for this basename: PROBNP,  in the last 8760 hours    CBG:  Recent Labs Lab 12/05/12 0307  12/05/12 0501 12/05/12 0623 12/05/12 0726  GLUCAP 569* 508* 479* 453*    Radiological Exams on Admission: Dg Chest Portable 1 View  12/05/2012  *RADIOLOGY REPORT*  Clinical Data: Cough; left upper quadrant abdominal pain and cramping.  History of smoking.  PORTABLE CHEST - 1 VIEW  Comparison: Chest radiograph performed 12/11/2011  Findings: The lungs are well-aerated.  Minimal left basilar opacity may reflect atelectasis.  There is no evidence of pleural effusion or pneumothorax.  The cardiomediastinal silhouette is within normal limits.  No acute osseous abnormalities are seen.  IMPRESSION: Minimal left basilar opacity may reflect atelectasis; lungs otherwise clear.   Original Report Authenticated By: Tonia Ghent, M.D.     EKG: Independently reviewed.  Assessment/Plan Principal Problem:   HHNC (hyperglycemic hyperosmolar nonketotic coma) Active Problems:   DIABETES MELLITUS, TYPE II   PERIPHERAL NEUROPATHY   DIABETIC FOOT ULCER, TOE   Urinary tract infection  Hyperosmolar hyperglycemia-nonketotic Likely secondary to urinary tract infection The patient has been started on glucose stabilizer.  Hypertension the patient will be continued on outpatient medications  Urinary tract infection the patient received Rocephin in the ED Urine culture will be sent  Hyponatremia likely secondary to hypoglycemia  Abdominal pain likely secondary to UTI, liver function tests and lipase normal  Code Status:   full Family Communication: bedside Disposition Plan: admit   Time spent: 70 mins   [facility] Pager [phone]  If 7PM-7AM, please contact night-coverage [email or web address] Password [facility] 12/05/2012, 7:51 AM

## 2012-12-06 DIAGNOSIS — E1101 Type 2 diabetes mellitus with hyperosmolarity with coma: Secondary | ICD-10-CM

## 2012-12-06 DIAGNOSIS — F329 Major depressive disorder, single episode, unspecified: Secondary | ICD-10-CM

## 2012-12-06 DIAGNOSIS — E119 Type 2 diabetes mellitus without complications: Secondary | ICD-10-CM

## 2012-12-06 DIAGNOSIS — N179 Acute kidney failure, unspecified: Secondary | ICD-10-CM

## 2012-12-06 DIAGNOSIS — N390 Urinary tract infection, site not specified: Secondary | ICD-10-CM

## 2012-12-06 LAB — GLUCOSE, CAPILLARY
Glucose-Capillary: 120 mg/dL — ABNORMAL HIGH (ref 70–99)
Glucose-Capillary: 402 mg/dL — ABNORMAL HIGH (ref 70–99)
Glucose-Capillary: 394 mg/dL — ABNORMAL HIGH (ref 70–99)

## 2012-12-06 LAB — TROPONIN I: Troponin I: <0.3 ng/mL (ref <0.30)

## 2012-12-06 LAB — URINE CULTURE: Colony Count: >=100000

## 2012-12-06 LAB — PHOSPHORUS: Phosphorus: 3.1 mg/dL (ref 2.3–4.6)

## 2012-12-06 MED ORDER — INSULIN DETEMIR 100 UNIT/ML ~~LOC~~ SOLN
15.0000 [IU] | Freq: Once | SUBCUTANEOUS | Status: AC
Start: 2012-12-06 — End: 2012-12-06
  Administered 2012-12-06: 15 [IU] via SUBCUTANEOUS
  Filled 2012-12-06: qty 0.15

## 2012-12-06 MED ORDER — INSULIN ASPART 100 UNIT/ML ~~LOC~~ SOLN
0.0000 [IU] | Freq: Three times a day (TID) | SUBCUTANEOUS | Status: DC
  Administered 2012-12-06 (×3): 20 [IU] via SUBCUTANEOUS
  Administered 2012-12-07 (×2): 15 [IU] via SUBCUTANEOUS

## 2012-12-06 MED ORDER — INSULIN ASPART 100 UNIT/ML ~~LOC~~ SOLN
0.0000 [IU] | Freq: Every day | SUBCUTANEOUS | Status: DC
  Administered 2012-12-06: 4 [IU] via SUBCUTANEOUS

## 2012-12-06 MED ORDER — SODIUM CHLORIDE 0.9 % IV SOLN
INTRAVENOUS | Status: DC
  Administered 2012-12-06 – 2012-12-07 (×2): via INTRAVENOUS

## 2012-12-06 MED ORDER — INSULIN DETEMIR 100 UNIT/ML ~~LOC~~ SOLN
40.0000 [IU] | Freq: Two times a day (BID) | SUBCUTANEOUS | Status: DC
  Administered 2012-12-06: 40 [IU] via SUBCUTANEOUS
  Filled 2012-12-06 (×3): qty 0.4

## 2012-12-06 MED ORDER — MORPHINE SULFATE 2 MG/ML IJ SOLN
1.0000 mg | Freq: Once | INTRAMUSCULAR | Status: AC
  Administered 2012-12-06: 1 mg via INTRAVENOUS
  Filled 2012-12-06: qty 1

## 2012-12-06 MED ORDER — NITROGLYCERIN 0.4 MG SL SUBL: 0.4000 mg | SUBLINGUAL_TABLET | SUBLINGUAL | Status: DC | PRN

## 2012-12-06 MED ORDER — INSULIN DETEMIR 100 UNIT/ML ~~LOC~~ SOLN
50.0000 [IU] | Freq: Two times a day (BID) | SUBCUTANEOUS | Status: DC
  Filled 2012-12-06: qty 0.5

## 2012-12-06 NOTE — Progress Notes (Signed)
Patient noted to be in an accelerated junctional rhythm.  Patient's leads checked for proper placement - and 12 lead ekg done for confirmation.  Patient denied c/o's and md aware - continue to monitor.

## 2012-12-06 NOTE — Progress Notes (Signed)
Inpatient Diabetes Program Recommendations  AACE/ADA: New Consensus Statement on Inpatient Glycemic Control (2013)  Target Ranges:  Prepandial:   less than 140 mg/dL      Peak postprandial:   less than 180 mg/dL (1-2 hours)      Critically ill patients:  140 - 180 mg/dL   Results for [name], [name] (MRN [id number]) as of 12/06/2012 10:02  Ref. Range 12/05/2012 20:19 12/05/2012 21:28 12/05/2012 22:41 12/05/2012 23:58 12/06/2012 01:03 12/06/2012 02:13 12/06/2012 08:14  Glucose-Capillary Latest Range: 70-99 mg/dL 086 (H) 578 (H) 469 (H) 111 (H) 120 (H) 152 (H) 405 (H)    Inpatient Diabetes Program Recommendations Insulin - Basal: Please increase Levemir to 40 units BID. Insulin - Meal Coverage: Please consider ordering Novolog 5 units meal coverage TID. IVF: - Please consider discontinuing D5 1/2 NS if patient is eating and drinking without any problems.  Note: Patient has a history of diabetes and takes Levemir 50 units BID and Novolog 40-50 units TID at home for diabetes management.  Currently, patient is ordered to receive Levemir 25 units BID, Novolog 0-20 AC and Novolog 0-5 HS for inpatient glycemic control.  Patient was given first dose of Levemir 25 units on 4/17 @ 23:34 and insulin drip stopped around 2am.  Fasting blood glucose this morning 405 mg/dl at 6:29BM.  Please consider increasing Levemir to 40 units daily (if Levemir is increased as recommended: since the patient has already received Lantus 20 units @ 10:03 I recommend that patient be given Levemir 15 units x one this morning). Please consider ordering Novolog 5 units TID for meal coverage if patient is eating at least 50% of meals.  Also, patient still has D5 1/2 NS listed as a continuous IVF; please consider discontinuing if patient is eating and drinking without any problems.  Will continue to follow.  Thanks, Orlando Penner, RN, BSN, CCRN Diabetes Coordinator Inpatient Diabetes Program [phone]

## 2012-12-06 NOTE — Progress Notes (Addendum)
Pt complaining of "pins and needles" 3/10 pain radiating on the left side of chest. BP 104/44, HR 47, O2 sats at 94% RA, RR 16. Claiborne Billings NP made aware. EKG 12 lead ordered and completed. Gave morphine 1mg , one time dose. Claiborne Billings NP at bedside and assessed patient. After 5 minutes, pt denies chest pain. EKG 12 lead to be done in 6 hours for recheck. Pt denies chest pain after morphine given. Will continue to monitor at this time. Gilman Schmidt

## 2012-12-06 NOTE — Progress Notes (Signed)
[facility] progress note. Chief complaint. Chest discomfort. History of present illness. This middle-aged female admitted with poorly controlled blood glucose, diabetes, UTI. Patient has no known cardiac history but does indicate a familial cardiac history in her mother. Patient complains of left chest discomfort with pins and needles sensation above the left breast and some pain below the left breast. I deferred sublingual nitroglycerin given a borderline systolic blood pressure. We did give her 1 mg of morphine sulfate as a one-time dose and obtain a 12-lead EKG. I came to see the patient at bedside and review the EKG. The EKG demonstrates junctional rhythm which was apparently present earlier in the day. I see no evidence of acute ischemia. Patient denies any current pain, numbness/tingling status post morphine administration. Vital signs. Temperature 98.1, pulse 58, respirations 17, blood pressure 104/44. O2 sats 94%. General appearance. Says obese middle-aged female who is alert, cooperative, and in no distress. Cardiac. Rate and rhythm regular. Lungs. Breath sounds clear and equal. Abdomen. Soft with positive bowel sounds. No pain with palpation. Musculoskeletal. I am able to reproduce the pain above the right breast with palpation. Impression/plan. Problem #1. Atypical chest pain. Findings are more suggestive with musculoskeletal pain actual cardiac ischemia. EKG does not show any indications of ischemia currently. We'll obtain troponin now and then every 6 hours for 3 sets to further evaluate. Also repeat a 12-lead EKG in about 6 hours.

## 2012-12-06 NOTE — Progress Notes (Signed)
[facility] PROGRESS NOTE  CHRISTLE NOLTING ZOX:[id number] DOB: [birth year] DOA: 12/05/2012 PCP: [name], MD  Assessment/Plan: 1. HONK - resolved and patient is off of insulin gtt  2. DM II uncontrolled - Diabetic coordinator on board. - blood sugar this AM was in 400's. - Will place patient on levemir 50 Units this AM and then continue 40 units SQ BID - Will continue sliding scale - discussed diabetic diet with patient.  3. UTI - U/A showed nitrite and many bacteria - Continue rocephin - Urine culture growing E. Coli with sensitivities pending.   Code Status: full Family Communication:  No family at bedside.  Disposition Plan: Pending improvement in blood sugars. Likely d/c within the next 1-2 days.  Consultants:  Diabetic coordinator  Procedures:  none  Antibiotics:  Rocephin 2/3  HPI/Subjective: No new complaints.  No acute issues overnight.  Reports elevated blood sugar this am.  Objective: Filed Vitals:   12/05/12 1630 12/05/12 2056 12/06/12 0619 12/06/12 0856  BP: 128/70 119/62 147/51 100/62  Pulse: 73 71 77 69  Temp: 98.5 F (36.9 C) 98.5 F (36.9 C) 97.6 F (36.4 C) 97.2 F (36.2 C)  TempSrc:  Oral  Oral  Resp: 20 19  18   Height:  5\' 3"  (1.6 m)    Weight:  104.2 kg (229 lb 11.5 oz)    SpO2: 99% 93% 96% 95%    Intake/Output Summary (Last 24 hours) at 12/06/12 1141 Last data filed at 12/06/12 0700  Gross per 24 hour  Intake 688.87 ml  Output      0 ml  Net 688.87 ml   Filed Weights   12/05/12 2056  Weight: 104.2 kg (229 lb 11.5 oz)    Exam:   General:  Pt in NAD, Alert and Awake  Cardiovascular: RRR, No MRG  Respiratory: CTA BL, no wheezing  Abdomen: soft, NT, ND  Musculoskeletal: no cyanosis or clubbing.   Data Reviewed: Basic Metabolic Panel:  Recent Labs Lab 12/05/12 0317 12/05/12 0854 12/05/12 1109 12/05/12 1317 12/05/12 1509  NA 130* 133* 136 136 139  K 4.7 3.9 4.1 4.4 4.2  CL 90* 99 101 103 104   CO2 31 26 27 27 28   GLUCOSE 629* 374* 271* 246* 236*  BUN 39* 35* 33* 31* 28*  CREATININE 0.89 0.84 0.80 0.79 0.74  CALCIUM 9.4 8.5 8.6 8.6 8.4   Liver Function Tests:  Recent Labs Lab 12/05/12 0317  AST 23  ALT 16  ALKPHOS 157*  BILITOT 0.2*  PROT 7.2  ALBUMIN 3.0*    Recent Labs Lab 12/05/12 0317  LIPASE 38   No results found for this basename: AMMONIA,  in the last 168 hours CBC:  Recent Labs Lab 12/05/12 0317 12/05/12 0854  WBC 8.5 8.4  NEUTROABS 4.3  --   HGB 15.5* 13.8  HCT 42.6 39.4  MCV 84.2 83.1  PLT 232 209   Cardiac Enzymes:  Recent Labs Lab 12/05/12 0820 12/05/12 1509 12/05/12 2021  TROPONINI <0.30 <0.30 <0.30   BNP (last 3 results) No results found for this basename: PROBNP,  in the last 8760 hours CBG:  Recent Labs Lab 12/05/12 2241 12/05/12 2358 12/06/12 0103 12/06/12 0213 12/06/12 0814  GLUCAP 115* 111* 120* 152* 405*    Recent Results (from the past 240 hour(s))  URINE CULTURE     Status: None   Collection Time    12/05/12  6:05 AM      Result Value Range Status   Specimen  Description URINE, CLEAN CATCH   Final   Special Requests NONE   Final   Culture  Setup Time 12/05/2012 07:30   Final   Colony Count >=100,000 COLONIES/ML   Final   Culture ESCHERICHIA COLI   Final   Report Status PENDING   Incomplete  MRSA PCR SCREENING     Status: None   Collection Time    12/05/12  9:00 AM      Result Value Range Status   MRSA by PCR NEGATIVE  NEGATIVE Final   Comment:            The GeneXpert MRSA Assay (FDA     approved for NASAL specimens     only), is one component of a     comprehensive MRSA colonization     surveillance program. It is not     intended to diagnose MRSA     infection nor to guide or     monitor treatment for     MRSA infections.     Studies: Dg Chest Portable 1 View  12/05/2012  *RADIOLOGY REPORT*  Clinical Data: Cough; left upper quadrant abdominal pain and cramping.  History of smoking.  PORTABLE  CHEST - 1 VIEW  Comparison: Chest radiograph performed 12/11/2011  Findings: The lungs are well-aerated.  Minimal left basilar opacity may reflect atelectasis.  There is no evidence of pleural effusion or pneumothorax.  The cardiomediastinal silhouette is within normal limits.  No acute osseous abnormalities are seen.  IMPRESSION: Minimal left basilar opacity may reflect atelectasis; lungs otherwise clear.   Original Report Authenticated By: Tonia Ghent, M.D.     Scheduled Meds: . atorvastatin  80 mg Oral Daily  . cefTRIAXone (ROCEPHIN)  IV  1 g Intravenous Q24H  . enoxaparin (LOVENOX) injection  40 mg Subcutaneous Q24H  . gabapentin  800 mg Oral TID  . hydrochlorothiazide  12.5 mg Oral Daily  . insulin aspart  0-20 Units Subcutaneous TID WC  . insulin aspart  0-5 Units Subcutaneous QHS  . insulin detemir  50 Units Subcutaneous BID  . levothyroxine  25 mcg Oral BH-q7a  . metoCLOPramide  5 mg Oral TID  . oxybutynin  5 mg Oral BH-q7a  . pantoprazole  40 mg Oral Daily   Continuous Infusions: . sodium chloride 100 mL/hr at 12/06/12 1610    Principal Problem:   Hyperosmolar non-ketotic state in patient with type 2 diabetes mellitus Active Problems:   DIABETES MELLITUS, TYPE II   PERIPHERAL NEUROPATHY   DIABETIC FOOT ULCER, TOE   Urinary tract infection    Time spent: > 35    Penny Pia  [facility] Pager [phone]. If 7PM-7AM, please contact night-coverage at [email or web address], password [facility] 12/06/2012, 11:41 AM  LOS: 1 day

## 2012-12-07 LAB — TROPONIN I
Troponin I: <0.3 ng/mL (ref <0.30)
Troponin I: <0.3 ng/mL (ref <0.30)

## 2012-12-07 MED ORDER — INSULIN DETEMIR 100 UNIT/ML ~~LOC~~ SOLN
50.0000 [IU] | Freq: Two times a day (BID) | SUBCUTANEOUS | Status: DC
  Administered 2012-12-07: 50 [IU] via SUBCUTANEOUS
  Filled 2012-12-07 (×2): qty 0.5

## 2012-12-07 MED ORDER — INSULIN ASPART 100 UNIT/ML ~~LOC~~ SOLN: 0.0000 [IU] | Freq: Three times a day (TID) | SUBCUTANEOUS | Status: DC

## 2012-12-07 MED ORDER — HYDROCODONE-ACETAMINOPHEN 5-325 MG PO TABS
1.0000 | ORAL_TABLET | Freq: Four times a day (QID) | ORAL | Status: DC | PRN
  Administered 2012-12-07: 1 via ORAL
  Filled 2012-12-07: qty 1

## 2012-12-07 NOTE — Discharge Summary (Signed)
Physician Discharge Summary  [name] JXB:[id number] DOB: [birth year] DOA: 12/05/2012  PCP: [name], MD  Admit date: 12/05/2012 Discharge date: 12/07/2012  Time spent: >35 minutes  Recommendations for Outpatient Follow-up:  1. Please be sure to adjust patient's hypoglycemic agents pending review of patient's blood sugars. 2. Also encourage diabetic diet  Discharge Diagnoses:  Principal Problem:   Hyperosmolar non-ketotic state in patient with type 2 diabetes mellitus Active Problems:   DIABETES MELLITUS, TYPE II   PERIPHERAL NEUROPATHY   DIABETIC FOOT ULCER, TOE   Urinary tract infection   Discharge Condition: stable  Diet recommendation: diabetic diet  Filed Weights   12/05/12 2056 12/06/12 2127  Weight: 104.2 kg (229 lb 11.5 oz) 105 kg (231 lb 7.7 oz)    History of present illness:  52 y/o female with h/o insulin dependent diabetes with previous BKA who presented to the ED with hyperglycemia.  Hospital Course:  1. HONK - resolved and patient is off of insulin gtt  - Transitioned to home regimen.  Discussed adherence to diabetic diet. Which patient assures me she will do.  Suspect improved blood sugars now that UTI has been properly treated.  Is to f/u with endocrinologist for further evaluation and recommendations regarding her hypoglycemic agents.  2. DM II uncontrolled  - Diabetic coordinator on board while patient was in house. - Increase Levemir to 50 Units SQ BID on discharge. - Will continue sliding scale  - discussed diabetic diet with patient.   3. UTI  - U/A showed nitrite and many bacteria  - Urine culture growing E. Coli with sensitive to Rocephin of which patient received 3 days of antibiotics.   Procedures:  Pt was on insulin gtt.  Consultations:  none  Discharge Exam: Filed Vitals:   12/06/12 1831 12/06/12 2127 12/07/12 0545 12/07/12 1009  BP: 142/77 104/44 140/71 168/72  Pulse: 58 65 71 66  Temp: 98.1 F (36.7 C) 98.5 F  (36.9 C) 97.9 F (36.6 C) 97.9 F (36.6 C)  TempSrc: Oral Oral Oral Oral  Resp: 17 18 20 19   Height:      Weight:  105 kg (231 lb 7.7 oz)    SpO2: 94% 94% 92% 90%    General: Pt in NAD, Alert and awake Cardiovascular: RRR, No MRG Respiratory: CTA BL, no wheezes  Discharge Instructions  Discharge Orders   Future Orders Complete By Expires     Call MD for:  persistant dizziness or light-headedness  As directed     Call MD for:  persistant nausea and vomiting  As directed     Call MD for:  redness, tenderness, or signs of infection (pain, swelling, redness, odor or green/yellow discharge around incision site)  As directed     Call MD for:  temperature >100.4  As directed     Diet - low sodium heart healthy  As directed     Discharge instructions  As directed     Comments:      You will need to follow up with your primary care physician in 1-2 weeks. Also you please be sure to limit your carbs. Log your blood sugars and please take to your physician for further evaluation and recommendations.    Increase activity slowly  As directed         Medication List    TAKE these medications       atorvastatin 80 MG tablet  Commonly known as:  LIPITOR  Take 80 mg by mouth daily.  DULoxetine 60 MG capsule  Commonly known as:  CYMBALTA  Take 60 mg by mouth every morning.     gabapentin 800 MG tablet  Commonly known as:  NEURONTIN  Take 800 mg by mouth 3 (three) times daily.     gemfibrozil 600 MG tablet  Commonly known as:  LOPID  Take 600 mg by mouth 2 (two) times daily before a meal.     hydrochlorothiazide 12.5 MG capsule  Commonly known as:  MICROZIDE  Take 12.5 mg by mouth daily.     HYDROcodone-acetaminophen 10-325 MG per tablet  Commonly known as:  NORCO  Take 1 tablet by mouth 2 (two) times daily as needed. For pain     insulin aspart 100 UNIT/ML injection  Commonly known as:  novoLOG  Inject 0-20 Units into the skin 3 (three) times daily with meals. Per  sliding scale provided by your endocrinologist.     insulin detemir 100 UNIT/ML injection  Commonly known as:  LEVEMIR  Inject 50 Units into the skin 2 (two) times daily.     levothyroxine 25 MCG tablet  Commonly known as:  SYNTHROID, LEVOTHROID  Take 25 mcg by mouth every morning.     metoCLOPramide 5 MG tablet  Commonly known as:  REGLAN  Take 5 mg by mouth 3 (three) times daily.     omeprazole 20 MG capsule  Commonly known as:  PRILOSEC  Take 20 mg by mouth daily.     oxybutynin 5 MG tablet  Commonly known as:  DITROPAN  Take 5 mg by mouth every morning.          The results of significant diagnostics from this hospitalization (including imaging, microbiology, ancillary and laboratory) are listed below for reference.    Significant Diagnostic Studies: Dg Chest Portable 1 View  12/05/2012  *RADIOLOGY REPORT*  Clinical Data: Cough; left upper quadrant abdominal pain and cramping.  History of smoking.  PORTABLE CHEST - 1 VIEW  Comparison: Chest radiograph performed 12/11/2011  Findings: The lungs are well-aerated.  Minimal left basilar opacity may reflect atelectasis.  There is no evidence of pleural effusion or pneumothorax.  The cardiomediastinal silhouette is within normal limits.  No acute osseous abnormalities are seen.  IMPRESSION: Minimal left basilar opacity may reflect atelectasis; lungs otherwise clear.   Original Report Authenticated By: Tonia Ghent, M.D.     Microbiology: Recent Results (from the past 240 hour(s))  URINE CULTURE     Status: None   Collection Time    12/05/12  6:05 AM      Result Value Range Status   Specimen Description URINE, CLEAN CATCH   Final   Special Requests NONE   Final   Culture  Setup Time 12/05/2012 07:30   Final   Colony Count >=100,000 COLONIES/ML   Final   Culture ESCHERICHIA COLI   Final   Report Status 12/06/2012 FINAL   Final   Organism ID, Bacteria ESCHERICHIA COLI   Final  MRSA PCR SCREENING     Status: None    Collection Time    12/05/12  9:00 AM      Result Value Range Status   MRSA by PCR NEGATIVE  NEGATIVE Final   Comment:            The GeneXpert MRSA Assay (FDA     approved for NASAL specimens     only), is one component of a     comprehensive MRSA colonization     surveillance program. It is  not     intended to diagnose MRSA     infection nor to guide or     monitor treatment for     MRSA infections.     Labs: Basic Metabolic Panel:  Recent Labs Lab 12/05/12 0317 12/05/12 0854 12/05/12 1109 12/05/12 1317 12/05/12 1509 12/06/12 1612  NA 130* 133* 136 136 139  --   K 4.7 3.9 4.1 4.4 4.2  --   CL 90* 99 101 103 104  --   CO2 31 26 27 27 28   --   GLUCOSE 629* 374* 271* 246* 236*  --   BUN 39* 35* 33* 31* 28*  --   CREATININE 0.89 0.84 0.80 0.79 0.74  --   CALCIUM 9.4 8.5 8.6 8.6 8.4  --   MG  --   --   --   --   --  1.8  PHOS  --   --   --   --   --  3.1   Liver Function Tests:  Recent Labs Lab 12/05/12 0317  AST 23  ALT 16  ALKPHOS 157*  BILITOT 0.2*  PROT 7.2  ALBUMIN 3.0*    Recent Labs Lab 12/05/12 0317  LIPASE 38   No results found for this basename: AMMONIA,  in the last 168 hours CBC:  Recent Labs Lab 12/05/12 0317 12/05/12 0854  WBC 8.5 8.4  NEUTROABS 4.3  --   HGB 15.5* 13.8  HCT 42.6 39.4  MCV 84.2 83.1  PLT 232 209   Cardiac Enzymes:  Recent Labs Lab 12/05/12 1509 12/05/12 2021 12/06/12 2121 12/07/12 0239 12/07/12 0935  TROPONINI <0.30 <0.30 <0.30 <0.30 <0.30   BNP: BNP (last 3 results) No results found for this basename: PROBNP,  in the last 8760 hours CBG:  Recent Labs Lab 12/06/12 1204 12/06/12 1648 12/06/12 2131 12/07/12 0732 12/07/12 1130  GLUCAP 402* 394* 336* 312* 337*       Signed:  Penny Pia  [facility] 12/07/2012, 12:42 PM

## 2012-12-07 NOTE — Progress Notes (Addendum)
Patient d/c'd home via wheelchair. "Mom" here to pick up. IV and tele d/c'd. No problems noted. Stage 1 pressure ulcer noted on buttock. Patient aware to change positions frequently, follow up with MD if problems.

## 2013-01-18 ENCOUNTER — Encounter (HOSPITAL_COMMUNITY): Payer: Self-pay | Admitting: *Deleted

## 2013-01-18 ENCOUNTER — Emergency Department (HOSPITAL_COMMUNITY)
Admission: EM | Admit: 2013-01-18 | Discharge: 2013-01-18 | Disposition: A | Discharge status: Home / Self Care | Payer: Medicaid Other | Attending: Emergency Medicine | Admitting: Emergency Medicine

## 2013-01-18 DIAGNOSIS — L02219 Cutaneous abscess of trunk, unspecified: Secondary | ICD-10-CM | POA: Insufficient documentation

## 2013-01-18 DIAGNOSIS — E039 Hypothyroidism, unspecified: Secondary | ICD-10-CM | POA: Insufficient documentation

## 2013-01-18 DIAGNOSIS — Z87442 Personal history of urinary calculi: Secondary | ICD-10-CM | POA: Insufficient documentation

## 2013-01-18 DIAGNOSIS — E1142 Type 2 diabetes mellitus with diabetic polyneuropathy: Secondary | ICD-10-CM | POA: Insufficient documentation

## 2013-01-18 DIAGNOSIS — E1159 Type 2 diabetes mellitus with other circulatory complications: Secondary | ICD-10-CM | POA: Insufficient documentation

## 2013-01-18 DIAGNOSIS — L02214 Cutaneous abscess of groin: Secondary | ICD-10-CM

## 2013-01-18 DIAGNOSIS — G709 Myoneural disorder, unspecified: Secondary | ICD-10-CM | POA: Insufficient documentation

## 2013-01-18 DIAGNOSIS — Z22322 Carrier or suspected carrier of Methicillin resistant Staphylococcus aureus: Secondary | ICD-10-CM | POA: Insufficient documentation

## 2013-01-18 DIAGNOSIS — F329 Major depressive disorder, single episode, unspecified: Secondary | ICD-10-CM | POA: Insufficient documentation

## 2013-01-18 DIAGNOSIS — Z8701 Personal history of pneumonia (recurrent): Secondary | ICD-10-CM | POA: Insufficient documentation

## 2013-01-18 DIAGNOSIS — Z79899 Other long term (current) drug therapy: Secondary | ICD-10-CM | POA: Insufficient documentation

## 2013-01-18 DIAGNOSIS — R011 Cardiac murmur, unspecified: Secondary | ICD-10-CM | POA: Insufficient documentation

## 2013-01-18 DIAGNOSIS — I10 Essential (primary) hypertension: Secondary | ICD-10-CM | POA: Insufficient documentation

## 2013-01-18 DIAGNOSIS — E079 Disorder of thyroid, unspecified: Secondary | ICD-10-CM | POA: Insufficient documentation

## 2013-01-18 DIAGNOSIS — Z888 Allergy status to other drugs, medicaments and biological substances status: Secondary | ICD-10-CM | POA: Insufficient documentation

## 2013-01-18 DIAGNOSIS — R609 Edema, unspecified: Secondary | ICD-10-CM | POA: Insufficient documentation

## 2013-01-18 DIAGNOSIS — R110 Nausea: Secondary | ICD-10-CM | POA: Insufficient documentation

## 2013-01-18 DIAGNOSIS — Z8679 Personal history of other diseases of the circulatory system: Secondary | ICD-10-CM | POA: Insufficient documentation

## 2013-01-18 DIAGNOSIS — F17200 Nicotine dependence, unspecified, uncomplicated: Secondary | ICD-10-CM | POA: Insufficient documentation

## 2013-01-18 DIAGNOSIS — M129 Arthropathy, unspecified: Secondary | ICD-10-CM | POA: Insufficient documentation

## 2013-01-18 DIAGNOSIS — E785 Hyperlipidemia, unspecified: Secondary | ICD-10-CM | POA: Insufficient documentation

## 2013-01-18 DIAGNOSIS — R21 Rash and other nonspecific skin eruption: Secondary | ICD-10-CM | POA: Insufficient documentation

## 2013-01-18 DIAGNOSIS — Z880 Allergy status to penicillin: Secondary | ICD-10-CM | POA: Insufficient documentation

## 2013-01-18 DIAGNOSIS — L03319 Cellulitis of trunk, unspecified: Secondary | ICD-10-CM | POA: Insufficient documentation

## 2013-01-18 DIAGNOSIS — R739 Hyperglycemia, unspecified: Secondary | ICD-10-CM

## 2013-01-18 DIAGNOSIS — Z794 Long term (current) use of insulin: Secondary | ICD-10-CM | POA: Insufficient documentation

## 2013-01-18 DIAGNOSIS — F3289 Other specified depressive episodes: Secondary | ICD-10-CM | POA: Insufficient documentation

## 2013-01-18 LAB — COMPREHENSIVE METABOLIC PANEL
AST: 14 U/L (ref 0–37)
CO2: 25 mEq/L (ref 19–32)
Calcium: 9.7 mg/dL (ref 8.4–10.5)
Creatinine, Ser: 1.1 mg/dL (ref 0.50–1.10)
GFR calc non Af Amer: 57 mL/min — ABNORMAL LOW (ref >90)

## 2013-01-18 LAB — URINALYSIS, ROUTINE W REFLEX MICROSCOPIC
Leukocytes, UA: NEGATIVE
Nitrite: NEGATIVE
Specific Gravity, Urine: 1.026 (ref 1.005–1.030)
Urobilinogen, UA: 1 mg/dL (ref 0.0–1.0)

## 2013-01-18 LAB — CBC WITH DIFFERENTIAL/PLATELET
Basophils Absolute: 0 10*3/uL (ref 0.0–0.1)
Basophils Relative: 0 % (ref 0–1)
Eosinophils Relative: 0 % (ref 0–5)
HCT: 36.5 % (ref 36.0–46.0)
Lymphocytes Relative: 15 % (ref 12–46)
MCH: 29.1 pg (ref 26.0–34.0)
MCHC: 35.1 g/dL (ref 30.0–36.0)
MCV: 83 fL (ref 78.0–100.0)
Monocytes Absolute: 1.4 10*3/uL — ABNORMAL HIGH (ref 0.1–1.0)
RDW: 13.1 % (ref 11.5–15.5)

## 2013-01-18 LAB — GLUCOSE, CAPILLARY
Glucose-Capillary: 413 mg/dL — ABNORMAL HIGH (ref 70–99)
Glucose-Capillary: 348 mg/dL — ABNORMAL HIGH (ref 70–99)
Glucose-Capillary: 319 mg/dL — ABNORMAL HIGH (ref 70–99)

## 2013-01-18 MED ORDER — CLINDAMYCIN PHOSPHATE 600 MG/50ML IV SOLN
600.0000 mg | Freq: Once | INTRAVENOUS | Status: AC
  Administered 2013-01-18: 600 mg via INTRAVENOUS
  Filled 2013-01-18: qty 50

## 2013-01-18 MED ORDER — INSULIN ASPART 100 UNIT/ML ~~LOC~~ SOLN
10.0000 [IU] | Freq: Once | SUBCUTANEOUS | Status: AC
  Administered 2013-01-18: 10 [IU] via INTRAVENOUS

## 2013-01-18 MED ORDER — HYDROMORPHONE HCL PF 1 MG/ML IJ SOLN
1.0000 mg | Freq: Once | INTRAMUSCULAR | Status: AC
  Administered 2013-01-18: 1 mg via INTRAVENOUS
  Filled 2013-01-18: qty 1

## 2013-01-18 MED ORDER — HYDROMORPHONE HCL PF 1 MG/ML IJ SOLN: 1.0000 mg | INTRAMUSCULAR | Status: DC

## 2013-01-18 MED ORDER — ONDANSETRON HCL 4 MG/2ML IJ SOLN: 4.0000 mg | Freq: Once | INTRAMUSCULAR | Status: DC

## 2013-01-18 MED ORDER — SODIUM CHLORIDE 0.9 % IV SOLN
1000.0000 mL | Freq: Once | INTRAVENOUS | Status: AC
  Administered 2013-01-18: 1000 mL via INTRAVENOUS

## 2013-01-18 MED ORDER — SODIUM CHLORIDE 0.9 % IV SOLN
1000.0000 mL | INTRAVENOUS | Status: DC
  Administered 2013-01-18: 1000 mL via INTRAVENOUS

## 2013-01-18 MED ORDER — SODIUM CHLORIDE 0.9 % IV SOLN
INTRAVENOUS | Status: DC
  Administered 2013-01-18: 3.5 [IU]/h via INTRAVENOUS
  Filled 2013-01-18: qty 1

## 2013-01-18 MED ORDER — OXYCODONE-ACETAMINOPHEN 5-325 MG PO TABS: 2.0000 | ORAL_TABLET | ORAL | Status: DC | PRN

## 2013-01-18 MED ORDER — CLINDAMYCIN HCL 150 MG PO CAPS: 300.0000 mg | ORAL_CAPSULE | Freq: Four times a day (QID) | ORAL | Status: DC

## 2013-01-18 MED ORDER — ONDANSETRON 4 MG PO TBDP: 4.0000 mg | ORAL_TABLET | Freq: Three times a day (TID) | ORAL | Status: DC | PRN

## 2013-01-18 NOTE — ED Provider Notes (Signed)
Patient presents with abdominal pain, which is likely related to right groin abscess.  Dr. Norlene Campbell asks me to drain the abscess.  INCISION AND DRAINAGE Performed by: Roxy Horseman Consent: Verbal consent obtained. Risks and benefits: risks, benefits and alternatives were discussed Type: abscess  Body area: right groin  Anesthesia: local infiltration  Incision was made with a scalpel.  Local anesthetic: lidocaine 2% with epinephrine  Anesthetic total: 7 ml  Complexity: complex Blunt dissection to break up loculations  Drainage: purulent  Drainage amount: 15-2ml  Packing material: not packed, elliptical incision used to maintain patency  Patient tolerance: Patient tolerated the procedure well with no immediate complications.  7:05 AM Abscess drained without complication.  Patient states, "I already feel better."  Warm compresses encouraged.  Care resumed by Dr. Norlene Campbell.     Roxy Horseman, PA-C 01/18/13 [phone]

## 2013-01-18 NOTE — ED Notes (Signed)
ED Provider in room. 

## 2013-01-18 NOTE — ED Notes (Signed)
Pt made aware of the need for a urine sample.  Pt stated she could not urinate at the moment, but would let staff know when she though she could/.

## 2013-01-18 NOTE — ED Notes (Addendum)
Pt brought in by [facility] for lower rt abd pain and upper rt leg. Pt reports pain started yesterday. C/o diarrhea and nausea X 1 day. CBG 484 en route.

## 2013-01-18 NOTE — ED Provider Notes (Signed)
History     CSN: [id number]  Arrival date & time 01/18/13  0422   First MD Initiated Contact with Patient 01/18/13 0501      Chief Complaint  Patient presents with  . Abdominal Pain    (Consider location/radiation/quality/duration/timing/severity/associated sxs/prior treatment) HPI 52 year old female presents to the emergency department via EMS with complaint of abdominal pain.  She reports pain started yesterday.  She reports that she has had yeast infection in her right groin, skin fold.  Pain started on Thursday him and has gotten steadily worse since that time.  She reports she had a boil arise on Friday.  She reports this been draining slightly.  She denies any fever or chills.  He has had boils in the past.  She is a diabetic.  She is noted to be hyperglycemic.  She has had some nausea, but without vomiting.  She has had some loose stools.  Past Medical History  Diagnosis Date  . Diabetic neuropathy   . Cellulitis   . Thyroid disease   . Hyperlipidemia     not on meds  . Heart murmur   . Arthritis   . Neuromuscular disorder   . Hypertension     hx of;but doesn't require meds anymore;has been off of x 34yr  . Cough   . Pneumonia     hx of;>59yr ago   . Headache(784.0)     occasionally  . Concussion as a teenager    slight  . Peripheral neuropathy   . Joint pain   . Joint swelling   . Dry skin   . Diarrhea     pt takes Reglan tid  . Urinary frequency   . Urinary urgency   . History of kidney stones   . Nocturia   . Hypothyroidism     takes Synthroid daily  . Diabetes mellitus     Novolog,Levemir,and Lovaza daily  . Depression     takes CYmbalta daily  . Peripheral edema     takes Lasix daily  . MRSA (methicillin resistant staph aureus) culture positive     hx of 2012  . Staph infection 2007    Past Surgical History  Procedure Laterality Date  . Amputation    . Leg amputation      right leg amputation below knee  . Toe amputation      left foot only  2 toes remaning.   . Tubal ligation    . Foot surgery      right foot  . Wrist surgery      right tendon  . Dilation and curettage of uterus    . Refractive surgery      left    Family History  Problem Relation Age of Onset  . Hypertension Mother   . Hyperlipidemia Mother   . Hypertension Brother   . Hyperlipidemia Brother   . Cancer Maternal Aunt   . Diabetes Paternal Aunt   . Diabetes Paternal Uncle   . Diabetes Paternal Grandmother   . Anesthesia problems Neg Hx   . Hypotension Neg Hx   . Malignant hyperthermia Neg Hx   . Pseudochol deficiency Neg Hx     History  Substance Use Topics  . Smoking status: Current Every Day Smoker -- 1.00 packs/day for 31 years  . Smokeless tobacco: Never Used  . Alcohol Use: No    OB History   Grav Para Term Preterm Abortions TAB SAB Ect Mult Living  Review of Systems  See History of Present Illness; otherwise all other systems are reviewed and negative  Allergies  Gadolinium; Ivp dye; Metformin; and Penicillins  Home Medications   Current Outpatient Rx  Name  Route  Sig  Dispense  Refill  . atorvastatin (LIPITOR) 80 MG tablet   Oral   Take 80 mg by mouth daily.         Marland Kitchen FLUoxetine (PROZAC) 20 MG tablet   Oral   Take 20 mg by mouth daily.         Marland Kitchen gabapentin (NEURONTIN) 800 MG tablet   Oral   Take 800 mg by mouth 3 (three) times daily.          Marland Kitchen gemfibrozil (LOPID) 600 MG tablet   Oral   Take 600 mg by mouth 2 (two) times daily before a meal.         . hydrochlorothiazide (MICROZIDE) 12.5 MG capsule   Oral   Take 12.5 mg by mouth daily.         Marland Kitchen HYDROcodone-acetaminophen (NORCO) 10-325 MG per tablet   Oral   Take 1 tablet by mouth 2 (two) times daily as needed. For pain         . insulin aspart (NOVOLOG) 100 UNIT/ML injection   Subcutaneous   Inject 0-20 Units into the skin 3 (three) times daily with meals. Per sliding scale provided by your endocrinologist.   1 vial   0    . insulin detemir (LEVEMIR) 100 UNIT/ML injection   Subcutaneous   Inject 60 Units into the skin 2 (two) times daily.          Marland Kitchen levothyroxine (SYNTHROID, LEVOTHROID) 25 MCG tablet   Oral   Take 25 mcg by mouth every morning.          . metoCLOPramide (REGLAN) 5 MG tablet   Oral   Take 5 mg by mouth 3 (three) times daily.          Marland Kitchen omeprazole (PRILOSEC) 20 MG capsule   Oral   Take 20 mg by mouth daily.         Marland Kitchen oxybutynin (DITROPAN) 5 MG tablet   Oral   Take 5 mg by mouth every morning.           . clindamycin (CLEOCIN) 150 MG capsule   Oral   Take 2 capsules (300 mg total) by mouth every 6 (six) hours.   56 capsule   0   . ondansetron (ZOFRAN ODT) 4 MG disintegrating tablet   Oral   Take 1 tablet (4 mg total) by mouth every 8 (eight) hours as needed for nausea.   20 tablet   0   . oxyCODONE-acetaminophen (PERCOCET/ROXICET) 5-325 MG per tablet   Oral   Take 2 tablets by mouth every 4 (four) hours as needed for pain.   20 tablet   0     BP 152/70  Pulse 78  Temp(Src) 98.8 F (37.1 C) (Oral)  Resp 17  SpO2 90%  Physical Exam  Nursing note and vitals reviewed. Constitutional: She is oriented to person, place, and time. She appears well-developed and well-nourished. She appears distressed.  HENT:  Head: Normocephalic and atraumatic.  Nose: Nose normal.  Mouth/Throat: Oropharynx is clear and moist.  Eyes: Conjunctivae and EOM are normal. Pupils are equal, round, and reactive to light.  Neck: Normal range of motion. Neck supple. No JVD present. No tracheal deviation present. No thyromegaly present.  Cardiovascular: Normal rate,  regular rhythm, normal heart sounds and intact distal pulses.  Exam reveals no gallop and no friction rub.   No murmur heard. Pulmonary/Chest: Effort normal and breath sounds normal. No stridor. No respiratory distress. She has no wheezes. She has no rales. She exhibits no tenderness.  Abdominal: Soft. Bowel sounds are normal.  She exhibits no distension and no mass. There is no tenderness. There is no rebound and no guarding.  Musculoskeletal: Normal range of motion. She exhibits no edema and no tenderness.  Patient missing multiple toes of left foot, and has right BKA  Lymphadenopathy:    She has no cervical adenopathy.  Neurological: She is alert and oriented to person, place, and time. She exhibits normal muscle tone. Coordination normal.  Skin: Skin is warm and dry. Rash noted. She is not diaphoretic. No erythema. No pallor.  Erythematous rash to right groin with draining abscess in the skin fold in right inguinal area.  Labia majora is swollen and red.  There is fluctuance noted and induration around the area.  Psychiatric: She has a normal mood and affect. Her behavior is normal. Judgment and thought content normal.    ED Course  Procedures (including critical care time)  Labs Reviewed  CBC WITH DIFFERENTIAL - Abnormal; Notable for the following:    WBC 12.8 (*)    Neutro Abs 9.4 (*)    Monocytes Absolute 1.4 (*)    All other components within normal limits  COMPREHENSIVE METABOLIC PANEL - Abnormal; Notable for the following:    Sodium 130 (*)    Chloride 86 (*)    Glucose, Bld 449 (*)    BUN 30 (*)    Albumin 2.8 (*)    Alkaline Phosphatase 125 (*)    GFR calc non Af Amer 57 (*)    GFR calc Af Amer 66 (*)    All other components within normal limits  CULTURE, BLOOD (ROUTINE X 2)  CULTURE, BLOOD (ROUTINE X 2)  CULTURE, ROUTINE-ABSCESS  URINALYSIS, ROUTINE W REFLEX MICROSCOPIC   No results found.   1. Abscess of right groin   2. Hyperglycemia       MDM  52 year old female with right groin abscess and hyperglycemia.  Abscess was drained by PA, see separate note.  Do not feel patient has necrotizing fasciitis or deep abscess at this time.  After drainage of the area, patient with full resolution of her pain.  Plan to manage blood sugar, and give a dose of IV antibiotics.  Patient did go home  on antibiotics, given her history of severe diabetes, and concern for cellulitis around the area.  He is instructed to followup either in the emergency room or to Dr. in 2-3 days for recheck  Care passed to Dr. Anitra Lauth awaiting improvement of blood sugars and IV antibiotic infusion        Olivia Mackie, MD 01/18/13 [phone]

## 2013-01-18 NOTE — ED Provider Notes (Signed)
Medical screening examination/treatment/procedure(s) were conducted as a shared visit with non-physician practitioner(s) and myself.  I personally evaluated the patient during the encounter.  See my separate note from this visit.  Olivia Mackie, MD 01/18/13 [phone]

## 2013-01-18 NOTE — ED Notes (Signed)
PT discharged to home with family. NAD. 

## 2013-01-19 DIAGNOSIS — T8859XA Other complications of anesthesia, initial encounter: Secondary | ICD-10-CM

## 2013-01-19 HISTORY — DX: Other complications of anesthesia, initial encounter: T88.59XA

## 2013-01-20 NOTE — ED Notes (Signed)
Post ED Visit - Positive Culture Follow-up  Culture report reviewed by antimicrobial stewardship pharmacist: []  Wes Dulaney, Pharm.D., BCPS []  Celedonio Miyamoto, Pharm.D., BCPS [x]  Georgina Pillion, Pharm.D., BCPS []  [place], [address].D., BCPS, AAHIVP []  Estella Husk, Pharm.D., BCPS, AAHIVP  Positive urine culture  Treated with clindamycin, organism sensitive to the same and no further patient follow-up is required at this time.  Larena Sox 01/20/2013, 4:32 PM

## 2013-01-21 LAB — CULTURE, ROUTINE-ABSCESS

## 2013-01-23 ENCOUNTER — Inpatient Hospital Stay (HOSPITAL_COMMUNITY)
Admission: EM | Admit: 2013-01-23 | Discharge: 2013-02-07 | DRG: 574 | Disposition: A | Discharge status: Skilled Nursing Facility | Payer: Medicaid Other | Attending: Internal Medicine | Admitting: Internal Medicine

## 2013-01-23 ENCOUNTER — Emergency Department (HOSPITAL_COMMUNITY): Payer: Medicaid Other

## 2013-01-23 ENCOUNTER — Encounter (HOSPITAL_COMMUNITY): Payer: Self-pay | Admitting: Family Medicine

## 2013-01-23 DIAGNOSIS — Y835 Amputation of limb(s) as the cause of abnormal reaction of the patient, or of later complication, without mention of misadventure at the time of the procedure: Secondary | ICD-10-CM | POA: Diagnosis present

## 2013-01-23 DIAGNOSIS — M609 Myositis, unspecified: Secondary | ICD-10-CM

## 2013-01-23 DIAGNOSIS — N189 Chronic kidney disease, unspecified: Secondary | ICD-10-CM | POA: Diagnosis present

## 2013-01-23 DIAGNOSIS — E1149 Type 2 diabetes mellitus with other diabetic neurological complication: Secondary | ICD-10-CM | POA: Diagnosis present

## 2013-01-23 DIAGNOSIS — E1165 Type 2 diabetes mellitus with hyperglycemia: Secondary | ICD-10-CM | POA: Diagnosis present

## 2013-01-23 DIAGNOSIS — R197 Diarrhea, unspecified: Secondary | ICD-10-CM | POA: Diagnosis present

## 2013-01-23 DIAGNOSIS — R0902 Hypoxemia: Secondary | ICD-10-CM | POA: Diagnosis present

## 2013-01-23 DIAGNOSIS — Z794 Long term (current) use of insulin: Secondary | ICD-10-CM

## 2013-01-23 DIAGNOSIS — F3289 Other specified depressive episodes: Secondary | ICD-10-CM

## 2013-01-23 DIAGNOSIS — L02219 Cutaneous abscess of trunk, unspecified: Principal | ICD-10-CM

## 2013-01-23 DIAGNOSIS — J9601 Acute respiratory failure with hypoxia: Secondary | ICD-10-CM

## 2013-01-23 DIAGNOSIS — E079 Disorder of thyroid, unspecified: Secondary | ICD-10-CM | POA: Diagnosis present

## 2013-01-23 DIAGNOSIS — E871 Hypo-osmolality and hyponatremia: Secondary | ICD-10-CM

## 2013-01-23 DIAGNOSIS — I10 Essential (primary) hypertension: Secondary | ICD-10-CM

## 2013-01-23 DIAGNOSIS — S98139A Complete traumatic amputation of one unspecified lesser toe, initial encounter: Secondary | ICD-10-CM

## 2013-01-23 DIAGNOSIS — B951 Streptococcus, group B, as the cause of diseases classified elsewhere: Secondary | ICD-10-CM | POA: Diagnosis present

## 2013-01-23 DIAGNOSIS — L02419 Cutaneous abscess of limb, unspecified: Secondary | ICD-10-CM | POA: Diagnosis present

## 2013-01-23 DIAGNOSIS — E875 Hyperkalemia: Secondary | ICD-10-CM | POA: Diagnosis present

## 2013-01-23 DIAGNOSIS — R809 Proteinuria, unspecified: Secondary | ICD-10-CM

## 2013-01-23 DIAGNOSIS — Z9981 Dependence on supplemental oxygen: Secondary | ICD-10-CM

## 2013-01-23 DIAGNOSIS — R609 Edema, unspecified: Secondary | ICD-10-CM | POA: Diagnosis present

## 2013-01-23 DIAGNOSIS — Y9210 Unspecified residential institution as the place of occurrence of the external cause: Secondary | ICD-10-CM | POA: Diagnosis present

## 2013-01-23 DIAGNOSIS — F17200 Nicotine dependence, unspecified, uncomplicated: Secondary | ICD-10-CM

## 2013-01-23 DIAGNOSIS — T8740 Infection of amputation stump, unspecified extremity: Secondary | ICD-10-CM | POA: Diagnosis present

## 2013-01-23 DIAGNOSIS — L03119 Cellulitis of unspecified part of limb: Secondary | ICD-10-CM | POA: Diagnosis present

## 2013-01-23 DIAGNOSIS — R3915 Urgency of urination: Secondary | ICD-10-CM | POA: Diagnosis present

## 2013-01-23 DIAGNOSIS — L03319 Cellulitis of trunk, unspecified: Secondary | ICD-10-CM

## 2013-01-23 DIAGNOSIS — E039 Hypothyroidism, unspecified: Secondary | ICD-10-CM

## 2013-01-23 DIAGNOSIS — F329 Major depressive disorder, single episode, unspecified: Secondary | ICD-10-CM

## 2013-01-23 DIAGNOSIS — G609 Hereditary and idiopathic neuropathy, unspecified: Secondary | ICD-10-CM

## 2013-01-23 DIAGNOSIS — R233 Spontaneous ecchymoses: Secondary | ICD-10-CM | POA: Diagnosis present

## 2013-01-23 DIAGNOSIS — Z87442 Personal history of urinary calculi: Secondary | ICD-10-CM

## 2013-01-23 DIAGNOSIS — H5316 Psychophysical visual disturbances: Secondary | ICD-10-CM | POA: Diagnosis present

## 2013-01-23 DIAGNOSIS — E7800 Pure hypercholesterolemia, unspecified: Secondary | ICD-10-CM

## 2013-01-23 DIAGNOSIS — K819 Cholecystitis, unspecified: Secondary | ICD-10-CM | POA: Diagnosis present

## 2013-01-23 DIAGNOSIS — L97509 Non-pressure chronic ulcer of other part of unspecified foot with unspecified severity: Secondary | ICD-10-CM

## 2013-01-23 DIAGNOSIS — E118 Type 2 diabetes mellitus with unspecified complications: Secondary | ICD-10-CM

## 2013-01-23 DIAGNOSIS — D638 Anemia in other chronic diseases classified elsewhere: Secondary | ICD-10-CM

## 2013-01-23 DIAGNOSIS — E119 Type 2 diabetes mellitus without complications: Secondary | ICD-10-CM

## 2013-01-23 DIAGNOSIS — D72829 Elevated white blood cell count, unspecified: Secondary | ICD-10-CM

## 2013-01-23 DIAGNOSIS — N179 Acute kidney failure, unspecified: Secondary | ICD-10-CM

## 2013-01-23 DIAGNOSIS — R739 Hyperglycemia, unspecified: Secondary | ICD-10-CM

## 2013-01-23 DIAGNOSIS — L02214 Cutaneous abscess of groin: Secondary | ICD-10-CM

## 2013-01-23 DIAGNOSIS — R350 Frequency of micturition: Secondary | ICD-10-CM | POA: Diagnosis present

## 2013-01-23 DIAGNOSIS — L0390 Cellulitis, unspecified: Secondary | ICD-10-CM

## 2013-01-23 DIAGNOSIS — IMO0002 Reserved for concepts with insufficient information to code with codable children: Secondary | ICD-10-CM

## 2013-01-23 LAB — APTT: aPTT: 35 seconds (ref 24–37)

## 2013-01-23 LAB — CBC WITH DIFFERENTIAL/PLATELET
Basophils Absolute: 0 10*3/uL (ref 0.0–0.1)
Basophils Absolute: 0 10*3/uL (ref 0.0–0.1)
Basophils Relative: 0 % (ref 0–1)
Eosinophils Absolute: 0.2 10*3/uL (ref 0.0–0.7)
Eosinophils Relative: 1 % (ref 0–5)
Eosinophils Relative: 2 % (ref 0–5)
HCT: 33.4 % — ABNORMAL LOW (ref 36.0–46.0)
Lymphocytes Relative: 24 % (ref 12–46)
Lymphs Abs: 2.7 10*3/uL (ref 0.7–4.0)
Lymphs Abs: 3.1 10*3/uL (ref 0.7–4.0)
MCH: 28.7 pg (ref 26.0–34.0)
MCHC: 35.2 g/dL (ref 30.0–36.0)
MCV: 82.7 fL (ref 78.0–100.0)
MCV: 81.6 fL (ref 78.0–100.0)
Monocytes Absolute: 1 10*3/uL (ref 0.1–1.0)
Neutro Abs: 7.1 10*3/uL (ref 1.7–7.7)
Neutrophils Relative %: 65 % (ref 43–77)
Platelets: 309 10*3/uL (ref 150–400)
Platelets: 334 10*3/uL (ref 150–400)
RBC: 4.04 MIL/uL (ref 3.87–5.11)
RBC: 4.14 MIL/uL (ref 3.87–5.11)
RDW: 13.2 % (ref 11.5–15.5)
RDW: 13.2 % (ref 11.5–15.5)
WBC: 11 10*3/uL — ABNORMAL HIGH (ref 4.0–10.5)

## 2013-01-23 LAB — COMPREHENSIVE METABOLIC PANEL
ALT: 11 U/L (ref 0–35)
Alkaline Phosphatase: 140 U/L — ABNORMAL HIGH (ref 39–117)
BUN: 34 mg/dL — ABNORMAL HIGH (ref 6–23)
CO2: 27 mEq/L (ref 19–32)
GFR calc Af Amer: 81 mL/min — ABNORMAL LOW (ref >90)
GFR calc non Af Amer: 70 mL/min — ABNORMAL LOW (ref >90)
Glucose, Bld: 316 mg/dL — ABNORMAL HIGH (ref 70–99)
Potassium: 3.5 mEq/L (ref 3.5–5.1)
Sodium: 131 mEq/L — ABNORMAL LOW (ref 135–145)
Total Bilirubin: 0.2 mg/dL — ABNORMAL LOW (ref 0.3–1.2)
Total Protein: 7.3 g/dL (ref 6.0–8.3)

## 2013-01-23 LAB — BASIC METABOLIC PANEL
CO2: 30 mEq/L (ref 19–32)
Calcium: 9.8 mg/dL (ref 8.4–10.5)
Chloride: 91 mEq/L — ABNORMAL LOW (ref 96–112)
Glucose, Bld: 298 mg/dL — ABNORMAL HIGH (ref 70–99)
Sodium: 133 mEq/L — ABNORMAL LOW (ref 135–145)

## 2013-01-23 LAB — MAGNESIUM: Magnesium: 1.8 mg/dL (ref 1.5–2.5)

## 2013-01-23 LAB — PROTIME-INR: Prothrombin Time: 14.8 seconds (ref 11.6–15.2)

## 2013-01-23 MED ORDER — VANCOMYCIN HCL IN DEXTROSE 1-5 GM/200ML-% IV SOLN
1000.0000 mg | Freq: Once | INTRAVENOUS | Status: AC
  Administered 2013-01-23: 1000 mg via INTRAVENOUS
  Filled 2013-01-23: qty 200

## 2013-01-23 MED ORDER — FLUOXETINE HCL 20 MG PO CAPS
20.0000 mg | ORAL_CAPSULE | Freq: Every day | ORAL | Status: DC
  Administered 2013-01-25 – 2013-02-07 (×12): 20 mg via ORAL
  Filled 2013-01-23 (×15): qty 1

## 2013-01-23 MED ORDER — HYDROMORPHONE HCL PF 2 MG/ML IJ SOLN: 2.0000 mg | Freq: Once | INTRAMUSCULAR | Status: AC

## 2013-01-23 MED ORDER — GABAPENTIN 400 MG PO CAPS
800.0000 mg | ORAL_CAPSULE | Freq: Three times a day (TID) | ORAL | Status: DC
  Filled 2013-01-23 (×4): qty 2

## 2013-01-23 MED ORDER — ACETAMINOPHEN 650 MG RE SUPP: 650.0000 mg | Freq: Four times a day (QID) | RECTAL | Status: DC | PRN

## 2013-01-23 MED ORDER — ONDANSETRON HCL 4 MG PO TABS
4.0000 mg | ORAL_TABLET | Freq: Four times a day (QID) | ORAL | Status: DC | PRN
  Administered 2013-01-30: 4 mg via ORAL

## 2013-01-23 MED ORDER — METOCLOPRAMIDE HCL 5 MG PO TABS
5.0000 mg | ORAL_TABLET | Freq: Three times a day (TID) | ORAL | Status: DC
  Administered 2013-01-24 – 2013-02-07 (×37): 5 mg via ORAL
  Filled 2013-01-23 (×46): qty 1

## 2013-01-23 MED ORDER — ONDANSETRON HCL 4 MG/2ML IJ SOLN
4.0000 mg | Freq: Four times a day (QID) | INTRAMUSCULAR | Status: DC | PRN
  Administered 2013-02-01: 4 mg via INTRAVENOUS
  Filled 2013-01-23 (×3): qty 2

## 2013-01-23 MED ORDER — SODIUM CHLORIDE 0.9 % IV SOLN
1.0000 g | INTRAVENOUS | Status: DC
  Administered 2013-01-25: 1 g via INTRAVENOUS
  Filled 2013-01-23 (×2): qty 1

## 2013-01-23 MED ORDER — ACETAMINOPHEN 325 MG PO TABS: 650.0000 mg | ORAL_TABLET | Freq: Four times a day (QID) | ORAL | Status: DC | PRN

## 2013-01-23 MED ORDER — IOHEXOL 300 MG/ML  SOLN
100.0000 mL | Freq: Once | INTRAMUSCULAR | Status: AC | PRN
  Administered 2013-01-23: 100 mL via INTRAVENOUS

## 2013-01-23 MED ORDER — OXYBUTYNIN CHLORIDE 5 MG PO TABS
5.0000 mg | ORAL_TABLET | Freq: Every day | ORAL | Status: DC
  Administered 2013-01-25 – 2013-02-07 (×12): 5 mg via ORAL
  Filled 2013-01-23 (×15): qty 1

## 2013-01-23 MED ORDER — GABAPENTIN 800 MG PO TABS
800.0000 mg | ORAL_TABLET | Freq: Three times a day (TID) | ORAL | Status: DC
  Filled 2013-01-23: qty 1

## 2013-01-23 MED ORDER — GABAPENTIN 400 MG PO CAPS
800.0000 mg | ORAL_CAPSULE | ORAL | Status: AC
  Administered 2013-01-24: 800 mg via ORAL
  Filled 2013-01-23: qty 2

## 2013-01-23 MED ORDER — HYDROMORPHONE HCL PF 2 MG/ML IJ SOLN
2.0000 mg | INTRAMUSCULAR | Status: DC | PRN
  Administered 2013-01-24: 2 mg via INTRAVENOUS
  Filled 2013-01-23: qty 2

## 2013-01-23 MED ORDER — INSULIN DETEMIR 100 UNIT/ML ~~LOC~~ SOLN
60.0000 [IU] | Freq: Two times a day (BID) | SUBCUTANEOUS | Status: DC
  Administered 2013-01-24 (×2): 60 [IU] via SUBCUTANEOUS
  Filled 2013-01-23 (×4): qty 0.6

## 2013-01-23 MED ORDER — ATORVASTATIN CALCIUM 80 MG PO TABS
80.0000 mg | ORAL_TABLET | Freq: Every day | ORAL | Status: DC
  Administered 2013-01-24 – 2013-02-03 (×11): 80 mg via ORAL
  Filled 2013-01-23 (×12): qty 1

## 2013-01-23 MED ORDER — GEMFIBROZIL 600 MG PO TABS
600.0000 mg | ORAL_TABLET | Freq: Two times a day (BID) | ORAL | Status: DC
  Administered 2013-01-24 – 2013-01-25 (×2): 600 mg via ORAL
  Filled 2013-01-23 (×5): qty 1

## 2013-01-23 MED ORDER — HYDROMORPHONE HCL PF 1 MG/ML IJ SOLN
1.0000 mg | Freq: Once | INTRAMUSCULAR | Status: AC
  Administered 2013-01-23: 1 mg via INTRAVENOUS
  Filled 2013-01-23: qty 1

## 2013-01-23 MED ORDER — SODIUM CHLORIDE 0.9 % IV SOLN
INTRAVENOUS | Status: AC
  Administered 2013-01-24 (×2): via INTRAVENOUS

## 2013-01-23 MED ORDER — INSULIN ASPART 100 UNIT/ML ~~LOC~~ SOLN
0.0000 [IU] | Freq: Every day | SUBCUTANEOUS | Status: DC
  Administered 2013-01-24: 4 [IU] via SUBCUTANEOUS
  Administered 2013-01-24: 3 [IU] via SUBCUTANEOUS
  Administered 2013-01-25: 5 [IU] via SUBCUTANEOUS
  Administered 2013-02-06: 2 [IU] via SUBCUTANEOUS

## 2013-01-23 MED ORDER — HYDROMORPHONE HCL PF 2 MG/ML IJ SOLN
INTRAMUSCULAR | Status: AC
  Administered 2013-01-23: 2 mg via INTRAMUSCULAR
  Filled 2013-01-23: qty 1

## 2013-01-23 MED ORDER — PANTOPRAZOLE SODIUM 40 MG PO TBEC
40.0000 mg | DELAYED_RELEASE_TABLET | Freq: Every day | ORAL | Status: DC
  Administered 2013-01-25 – 2013-02-07 (×12): 40 mg via ORAL
  Filled 2013-01-23 (×13): qty 1

## 2013-01-23 MED ORDER — VANCOMYCIN HCL IN DEXTROSE 1-5 GM/200ML-% IV SOLN
1000.0000 mg | Freq: Two times a day (BID) | INTRAVENOUS | Status: DC
  Administered 2013-01-24 – 2013-01-25 (×3): 1000 mg via INTRAVENOUS
  Filled 2013-01-23 (×5): qty 200

## 2013-01-23 MED ORDER — DIPHENHYDRAMINE HCL 50 MG/ML IJ SOLN
50.0000 mg | Freq: Once | INTRAMUSCULAR | Status: AC
  Administered 2013-01-23: 50 mg via INTRAVENOUS
  Filled 2013-01-23: qty 1

## 2013-01-23 MED ORDER — ONDANSETRON HCL 4 MG/2ML IJ SOLN: 4.0000 mg | Freq: Three times a day (TID) | INTRAMUSCULAR | Status: DC | PRN

## 2013-01-23 MED ORDER — INSULIN ASPART 100 UNIT/ML ~~LOC~~ SOLN
0.0000 [IU] | Freq: Three times a day (TID) | SUBCUTANEOUS | Status: DC
  Administered 2013-01-24 – 2013-01-26 (×4): 11 [IU] via SUBCUTANEOUS
  Administered 2013-01-26: 3 [IU] via SUBCUTANEOUS
  Administered 2013-01-26: 8 [IU] via SUBCUTANEOUS
  Administered 2013-01-27 (×2): 11 [IU] via SUBCUTANEOUS

## 2013-01-23 MED ORDER — VANCOMYCIN HCL IN DEXTROSE 1-5 GM/200ML-% IV SOLN
1000.0000 mg | Freq: Once | INTRAVENOUS | Status: AC
  Administered 2013-01-24: 1000 mg via INTRAVENOUS
  Filled 2013-01-23: qty 200

## 2013-01-23 MED ORDER — ONDANSETRON HCL 4 MG/2ML IJ SOLN
4.0000 mg | Freq: Once | INTRAMUSCULAR | Status: AC
  Administered 2013-01-23: 4 mg via INTRAVENOUS
  Filled 2013-01-23: qty 2

## 2013-01-23 MED ORDER — LEVOTHYROXINE SODIUM 25 MCG PO TABS
25.0000 ug | ORAL_TABLET | Freq: Every day | ORAL | Status: DC
  Administered 2013-01-25 – 2013-02-07 (×12): 25 ug via ORAL
  Filled 2013-01-23 (×16): qty 1

## 2013-01-23 MED ORDER — HYDROCHLOROTHIAZIDE 12.5 MG PO CAPS
12.5000 mg | ORAL_CAPSULE | Freq: Every day | ORAL | Status: DC
  Filled 2013-01-23: qty 1

## 2013-01-23 MED ORDER — HYDRALAZINE HCL 20 MG/ML IJ SOLN: 5.0000 mg | Freq: Three times a day (TID) | INTRAMUSCULAR | Status: DC | PRN

## 2013-01-23 MED ORDER — OXYCODONE-ACETAMINOPHEN 5-325 MG PO TABS
1.0000 | ORAL_TABLET | ORAL | Status: DC | PRN
  Administered 2013-01-25 (×2): 1 via ORAL
  Filled 2013-01-23 (×2): qty 1

## 2013-01-23 MED ORDER — SODIUM CHLORIDE 0.9 % IV SOLN
1.0000 g | Freq: Once | INTRAVENOUS | Status: AC
  Administered 2013-01-24: 1 g via INTRAVENOUS
  Filled 2013-01-23: qty 1

## 2013-01-23 NOTE — Consult Note (Signed)
Reason for Consult:right groin infection Referring Physician: [name] is an 52 y.o. female.  HPI: Previously drained right groin abscess in high risk diabetic patient, now continuing to have pain, but the wound is still draining.  Foul-smelling drainage, not septic, WBC 11K.  Past Medical History  Diagnosis Date  . Diabetic neuropathy   . Cellulitis   . Thyroid disease   . Hyperlipidemia     not on meds  . Heart murmur   . Arthritis   . Neuromuscular disorder   . Hypertension     hx of;but doesn't require meds anymore;has been off of x 51yr  . Cough   . Pneumonia     hx of;>55yr ago   . Headache(784.0)     occasionally  . Concussion as a teenager    slight  . Peripheral neuropathy   . Joint pain   . Joint swelling   . Dry skin   . Diarrhea     pt takes Reglan tid  . Urinary frequency   . Urinary urgency   . History of kidney stones   . Nocturia   . Hypothyroidism     takes Synthroid daily  . Diabetes mellitus     Novolog,Levemir,and Lovaza daily  . Depression     takes CYmbalta daily  . Peripheral edema     takes Lasix daily  . MRSA (methicillin resistant staph aureus) culture positive     hx of 2012  . Staph infection 2007    Past Surgical History  Procedure Laterality Date  . Amputation    . Leg amputation      right leg amputation below knee  . Toe amputation      left foot only 2 toes remaning.   . Tubal ligation    . Foot surgery      right foot  . Wrist surgery      right tendon  . Dilation and curettage of uterus    . Refractive surgery      left    Family History  Problem Relation Age of Onset  . Hypertension Mother   . Hyperlipidemia Mother   . Hypertension Brother   . Hyperlipidemia Brother   . Cancer Maternal Aunt   . Diabetes Paternal Aunt   . Diabetes Paternal Uncle   . Diabetes Paternal Grandmother   . Anesthesia problems Neg Hx   . Hypotension Neg Hx   . Malignant hyperthermia Neg Hx   . Pseudochol  deficiency Neg Hx     Social History:  reports that she has been smoking.  She has never used smokeless tobacco. She reports that she does not drink alcohol or use illicit drugs.  Allergies:  Allergies  Allergen Reactions  . Gadolinium      Code: VOM, Desc: Pt began vomiting immed post infusion of multihance, Onset Date: [id number]   . Ivp Dye (Iodinated Diagnostic Agents) Nausea And Vomiting  . Metformin Other (See Comments)     diarrhea  . Penicillins Hives    Medications: I have reviewed the patient's current medications.  Results for orders placed during the hospital encounter of 01/23/13 (from the past 48 hour(s))  CBC WITH DIFFERENTIAL     Status: Abnormal   Collection Time    01/23/13  7:03 PM      Result Value Range   WBC 11.0 (*) 4.0 - 10.5 K/uL   RBC 4.04  3.87 - 5.11 MIL/uL   Hemoglobin 11.3 (*) 12.0 -  15.0 g/dL   HCT 16.1 (*) 09.6 - 04.5 %   MCV 82.7  78.0 - 100.0 fL   MCH 28.0  26.0 - 34.0 pg   MCHC 33.8  30.0 - 36.0 g/dL   RDW 40.9  81.1 - 91.4 %   Platelets 309  150 - 400 K/uL   Neutrophils Relative % 65  43 - 77 %   Neutro Abs 7.1  1.7 - 7.7 K/uL   Lymphocytes Relative 24  12 - 46 %   Lymphs Abs 2.7  0.7 - 4.0 K/uL   Monocytes Relative 9  3 - 12 %   Monocytes Absolute 1.0  0.1 - 1.0 K/uL   Eosinophils Relative 1  0 - 5 %   Eosinophils Absolute 0.1  0.0 - 0.7 K/uL   Basophils Relative 0  0 - 1 %   Basophils Absolute 0.0  0.0 - 0.1 K/uL  BASIC METABOLIC PANEL     Status: Abnormal   Collection Time    01/23/13  7:03 PM      Result Value Range   Sodium 133 (*) 135 - 145 mEq/L   Potassium 4.0  3.5 - 5.1 mEq/L   Chloride 91 (*) 96 - 112 mEq/L   CO2 30  19 - 32 mEq/L   Glucose, Bld 298 (*) 70 - 99 mg/dL   BUN 35 (*) 6 - 23 mg/dL   Creatinine, Ser 7.82  0.50 - 1.10 mg/dL   Calcium 9.8  8.4 - 95.6 mg/dL   GFR calc non Af Amer 62 (*) >90 mL/min   GFR calc Af Amer 72 (*) >90 mL/min   Comment:            The eGFR has been calculated     using the CKD EPI  equation.     This calculation has not been     validated in all clinical     situations.     eGFR's persistently     <90 mL/min signify     possible Chronic Kidney Disease.    Ct Pelvis W Contrast  01/23/2013   *RADIOLOGY REPORT*  Clinical Data:  Right groin boil lanced 5 days ago with persistent pain and drainage from the area  CT PELVIS WITH CONTRAST  Technique:  Multidetector CT imaging of the pelvis was performed using the standard protocol following the bolus administration of intravenous contrast.  Contrast: OMNIPAQUE IOHEXOL 300 MG/ML  SOLN  Comparison:   None.  Findings:  Visualized bowel is unremarkable.  Uterus and ovaries are normal.  Bladder is normal.  No intrapelvic lymphadenopathy or free fluid.  Prominent right groin lymph nodes are noted measuring 1.4 cm in maximal short axis diameter image 39.  There is fusiform enlargement of the gracilis muscle and adductor longus muscle on the right, with overlying subcutaneous soft tissue edema.  Areas of hypo enhancement are noted within these muscles with mild peripheral enhancement which may indicate abscess formation, measuring 1.7 cm within the abductor longus muscle and 1.6 cm within the gracilis muscle.  There is minimal fluid overlying the gracilis muscle subjacent to the dermis but this itself is likely too small for percutaneous drainage, for example image 55 measuring 8 mm.  No acute osseous abnormality.  IMPRESSION: Fusiform enlargement of the right adductor longus and gracilis muscles with overlying skin edema and fluid most compatible with cellulitis and myositis with internal areas of probable abscess formation.  No subcutaneous or dermal drainable fluid collection.  Original Report Authenticated By: Christiana Pellant, M.D.    Review of Systems  Constitutional: Positive for chills. Negative for fever.   Blood pressure 144/64, pulse 79, temperature 97.5 F (36.4 C), resp. rate 16, SpO2 92.00%. Physical Exam  Constitutional:  She is oriented to person, place, and time.  Obese, DM  HENT:  Head: Normocephalic and atraumatic.  Neck: Normal range of motion. Neck supple.  Cardiovascular: Normal rate.   Genitourinary:     Neurological: She is alert and oriented to person, place, and time.    Assessment/Plan: Deep soft tissue infection of the right groin. Will need wide drainage in the AM Will place the patient on antibiotics tonight. NPO after midnight.  [name] 01/23/2013, 10:46 PM

## 2013-01-23 NOTE — ED Notes (Signed)
Call pt's mother if needed at [phone]

## 2013-01-23 NOTE — H&P (Addendum)
[facility] History and Physical  [name] NFA:[id number] DOB: [birth year] DOA: 01/23/2013  Referring physician: ER physician PCP: [name], MD   Chief Complaint: persistent right groin abscess   HPI: Pt is 52 y.o. female with a history of diabetes mellitus, insulin dependent who present to [facility] ED with main concerns persistent right inguinal abscess which was drained 5 days prior to this admission in the emergency department, and continues to drain yellow pus, she was discharged on Clindamycin ut explains it has not been getting much better. She describes throbbing pain un the right inguinal area, intermittent and 5/10 in severity, radiating throughout groin area, no specific alleviating or aggravating factors. Pt denies fevers, chills, other systemic concerns.    In ED, pt with persistent pain in the right groin area, leukocytosis improved since last ED visit 12.8 --> 11. [facility] asked to admit for further evaluation and management.   Assessment and Plan:  Principal Problem:   Abscess of groin, right - will stop clindamycin and will start on broad coverage Vancomycin and Invanz, given uncontrolled diabetes  - will follow up on surgery recommendations, plan on I&D in AM Active Problems:   DIABETES MELLITUS, TYPE II - uncontrolled, with neuropathy  - will check A1C, continue home medical regimen, monitor CBG per protocol and readjust the regimen as indicated  - start SSI for now   DEPRESSION - appears stable at this time, normal affect   PERIPHERAL NEUROPATHY - complication of diabetes, continue Neurontin    Hyponatremia - mild and likely component of pre renal etiology - will provide gentle hydration with IVF - repeat BMP in AM   HTN (hypertension) - continue HCTZ, will add hydralazine as needed for now but may need to add hydralazine scheduled dose   Leukocytosis - secondary to abscess and slightly trending down, will continue broad spectrum ABX as noted above -  CBC in AM   Anemia of chronic disease - Hg and Hct stable and at pt's baseline  - CBC in AM  Manson Passey [facility] [phone]  Review of Systems:  Constitutional: Negative for fever, chills and malaise/fatigue. Negative for diaphoresis.  HENT: Negative for hearing loss, ear pain, nosebleeds, congestion, sore throat, neck pain, tinnitus and ear discharge.   Eyes: Negative for blurred vision, double vision, photophobia, pain, discharge and redness.  Respiratory: Negative for cough, hemoptysis, sputum production, shortness of breath, wheezing and stridor.   Cardiovascular: Negative for chest pain, palpitations, orthopnea, claudication and leg swelling.  Gastrointestinal: Negative for nausea, vomiting and abdominal pain. Negative for heartburn, constipation, blood in stool and melena.  Genitourinary: Negative for dysuria, urgency, frequency, hematuria and flank pain.  Musculoskeletal: Negative for myalgias, back pain, joint pain and falls.  Skin: Negative for itching and rash.  Neurological: Negative for dizziness and weakness. Negative for tingling, tremors, sensory change, speech change, focal weakness, loss of consciousness and headaches.  Endo/Heme/Allergies: Negative for environmental allergies and polydipsia. Does not bruise/bleed easily.  Psychiatric/Behavioral: Negative for suicidal ideas. The patient is not nervous/anxious.      Past Medical History  Diagnosis Date  . Diabetic neuropathy   . Cellulitis   . Thyroid disease   . Hyperlipidemia     not on meds  . Heart murmur   . Arthritis   . Neuromuscular disorder   . Hypertension     hx of;but doesn't require meds anymore;has been off of x 42yr  . Cough   . Pneumonia     hx of;>36yr ago   .  Headache(784.0)     occasionally  . Concussion as a teenager    slight  . Peripheral neuropathy   . Joint pain   . Joint swelling   . Dry skin   . Diarrhea     pt takes Reglan tid  . Urinary frequency   . Urinary urgency   . History  of kidney stones   . Nocturia   . Hypothyroidism     takes Synthroid daily  . Diabetes mellitus     Novolog,Levemir,and Lovaza daily  . Depression     takes CYmbalta daily  . Peripheral edema     takes Lasix daily  . MRSA (methicillin resistant staph aureus) culture positive     hx of 2012  . Staph infection 2007   Past Surgical History  Procedure Laterality Date  . Amputation    . Leg amputation      right leg amputation below knee  . Toe amputation      left foot only 2 toes remaning.   . Tubal ligation    . Foot surgery      right foot  . Wrist surgery      right tendon  . Dilation and curettage of uterus    . Refractive surgery      left   Social History:  reports that she has been smoking.  She has never used smokeless tobacco. She reports that she does not drink alcohol or use illicit drugs.  Allergies  Allergen Reactions  . Gadolinium      Code: VOM, Desc: Pt began vomiting immed post infusion of multihance, Onset Date: [id number]   . Ivp Dye (Iodinated Diagnostic Agents) Nausea And Vomiting  . Metformin Other (See Comments)     diarrhea  . Penicillins Hives    Family History:  Family History  Problem Relation Age of Onset  . Hypertension Mother   . Hyperlipidemia Mother   . Hypertension Brother   . Hyperlipidemia Brother   . Cancer Maternal Aunt   . Diabetes Paternal Aunt   . Diabetes Paternal Uncle   . Diabetes Paternal Grandmother   . Anesthesia problems Neg Hx   . Hypotension Neg Hx   . Malignant hyperthermia Neg Hx   . Pseudochol deficiency Neg Hx      Medication Sig  atorvastatin (LIPITOR) 80 MG tablet Take 80 mg by mouth daily.  FLUoxetine (PROZAC) 20 MG tablet Take 20 mg by mouth daily.  gabapentin (NEURONTIN) 800 MG tablet Take 800 mg by mouth 3 (three) times daily.   gemfibrozil (LOPID) 600 MG tablet Take 600 mg by mouth 2 (two) times daily before a meal.  hydrochlorothiazide (MICROZIDE) 12.5 MG capsule Take 12.5 mg by mouth daily.   HYDROcodone-acetaminophen (NORCO) 10-325 MG per tablet Take 1 tablet by mouth 2 (two) times daily as needed. For pain  insulin aspart (NOVOLOG) 100 UNIT/ML injection Inject 0-48 Units into the skin 3 (three) times daily with meals. Per sliding scale provided by endocrinologist  insulin detemir (LEVEMIR) 100 UNIT/ML injection Inject 60 Units into the skin 2 (two) times daily.   levothyroxine (SYNTHROID, LEVOTHROID) 25 MCG tablet Take 25 mcg by mouth every morning.   metoCLOPramide (REGLAN) 5 MG tablet Take 5 mg by mouth 3 (three) times daily.   omeprazole (PRILOSEC) 20 MG capsule Take 20 mg by mouth daily.  oxybutynin (DITROPAN) 5 MG tablet Take 5 mg by mouth every morning.    oxyCODONE-acetaminophen (PERCOCET/ROXICET) 5-325 MG per tablet Take 1 tablet by  mouth every 4 (four) hours as needed for pain.  ondansetron (ZOFRAN ODT) 4 MG disintegrating tablet Take 1 tablet (4 mg total) by mouth every 8 (eight) hours as needed for nausea.   Physical Exam: Filed Vitals:   01/23/13 2000 01/23/13 2015 01/23/13 2030 01/23/13 2045  BP: 163/64  144/64   Pulse: 77 78 78 79  Temp:      Resp:      SpO2: 93% 92% 92%     Physical Exam  Constitutional: Appears well-developed and well-nourished. No distress.  HENT: Normocephalic. External right and left ear normal. Oropharynx is clear and moist.  Eyes: Conjunctivae and EOM are normal. PERRLA, no scleral icterus.  Neck: Normal ROM. Neck supple. No JVD. No tracheal deviation. No thyromegaly.  CVS: RRR, S1/S2 +, no murmurs, no gallops, no carotid bruit.  Pulmonary: Effort and breath sounds normal, no stridor, rhonchi, wheezes, rales.  Abdominal: Soft. BS +,  no distension, tenderness, rebound or guarding.  Musculoskeletal: right BKA, left LE pulses palpable   Lymphadenopathy: No lymphadenopathy noted, cervical, inguinal. Neuro: Alert. Normal reflexes, muscle tone coordination. No cranial nerve deficit. Skin: Skin is warm and dry. No rash noted. Not  diaphoretic. Right groin area abscess, draining yellow pus, tender to palpation  Psychiatric: Normal mood and affect. Behavior, judgment, thought content normal.   Labs on Admission:  Basic Metabolic Panel:  Recent Labs Lab 01/18/13 0556 01/23/13 1903  NA 130* 133*  K 3.7 4.0  CL 86* 91*  CO2 25 30  GLUCOSE 449* 298*  BUN 30* 35*  CREATININE 1.10 1.03  CALCIUM 9.7 9.8   Liver Function Tests:  Recent Labs Lab 01/18/13 0556  AST 14  ALT 12  ALKPHOS 125*  BILITOT 0.4  PROT 8.0  ALBUMIN 2.8*   CBC:  Recent Labs Lab 01/18/13 0556 01/23/13 1903  WBC 12.8* 11.0*  NEUTROABS 9.4* 7.1  HGB 12.8 11.3*  HCT 36.5 33.4*  MCV 83.0 82.7  PLT 242 309   CBG:  Recent Labs Lab 01/18/13 0848 01/18/13 0948 01/18/13 1101  GLUCAP 413* 348* 319*    Radiological Exams on Admission: Ct Pelvis W Contrast 01/23/2013    Fusiform enlargement of the right adductor longus and gracilis muscles with overlying skin edema and fluid most compatible with cellulitis and myositis with internal areas of probable abscess formation.  No subcutaneous or dermal drainable fluid collection.    EKG: Normal sinus rhythm, no ST/T wave changes  Code Status: Full Family Communication: Pt at bedside Disposition Plan: Admit for further evaluation  Manson Passey, MD  [facility] Pager [phone]  If 7PM-7AM, please contact night-coverage [email or web address] Password [facility] 01/23/2013, 10:39 PM

## 2013-01-23 NOTE — ED Notes (Signed)
Per pt here for re check on abscess between legs that was lanced about 1 week ago. sts infection and pain are worse

## 2013-01-23 NOTE — Progress Notes (Signed)
ANTIBIOTIC CONSULT NOTE - INITIAL  Pharmacy Consult for vancomycin Indication: groin infection  Allergies  Allergen Reactions  . Gadolinium      Code: VOM, Desc: Pt began vomiting immed post infusion of multihance, Onset Date: 45409811   . Ivp Dye (Iodinated Diagnostic Agents) Nausea And Vomiting  . Metformin Other (See Comments)     diarrhea  . Penicillins Hives    Patient Measurements: Height: 5\' 3"  (160 cm) Weight: 231 lb 7.7 oz (105 kg) IBW/kg (Calculated) : 52.4  Vital Signs: Temp: 97.5 F (36.4 C) (06/05 1543) BP: 132/64 mmHg (06/05 2257) Pulse Rate: 95 (06/05 2257)  Labs:  Recent Labs  01/23/13 1903  WBC 11.0*  HGB 11.3*  PLT 309  CREATININE 1.03   Estimated Creatinine Clearance: 74.9 ml/min (by C-G formula based on Cr of 1.03).   Microbiology: Recent Results (from the past 720 hour(s))  CULTURE, ROUTINE-ABSCESS     Status: None   Collection Time    01/18/13  5:48 AM      Result Value Range Status   Specimen Description ABSCESS   Final   Special Requests GROIN RIGHT   Final   Gram Stain     Final   Value: NO WBC SEEN     NO SQUAMOUS EPITHELIAL CELLS SEEN     MODERATE GRAM POSITIVE COCCI     IN PAIRS IN CHAINS   Culture     Final   Value: MODERATE GROUP B STREP(S.AGALACTIAE)ISOLATED     Note: TESTING AGAINST S. AGALACTIAE NOT ROUTINELY PERFORMED DUE TO PREDICTABILITY OF AMP/PEN/VAN SUSCEPTIBILITY.   Report Status 01/21/2013 FINAL   Final  CULTURE, BLOOD (ROUTINE X 2)     Status: None   Collection Time    01/18/13  6:45 AM      Result Value Range Status   Specimen Description BLOOD LEFT ARM   Final   Special Requests BOTTLES DRAWN AEROBIC AND ANAEROBIC 10CC   Final   Culture  Setup Time 01/18/2013 11:19   Final   Culture     Final   Value:        BLOOD CULTURE RECEIVED NO GROWTH TO DATE CULTURE WILL BE HELD FOR 5 DAYS BEFORE ISSUING A FINAL NEGATIVE REPORT   Report Status PENDING   Incomplete  CULTURE, BLOOD (ROUTINE X 2)     Status: None   Collection Time    01/18/13  6:52 AM      Result Value Range Status   Specimen Description BLOOD LEFT HAND   Final   Special Requests BOTTLES DRAWN AEROBIC AND ANAEROBIC 10CC   Final   Culture  Setup Time 01/18/2013 11:19   Final   Culture     Final   Value:        BLOOD CULTURE RECEIVED NO GROWTH TO DATE CULTURE WILL BE HELD FOR 5 DAYS BEFORE ISSUING A FINAL NEGATIVE REPORT   Report Status PENDING   Incomplete    Medical History: Past Medical History  Diagnosis Date  . Diabetic neuropathy   . Cellulitis   . Thyroid disease   . Hyperlipidemia     not on meds  . Heart murmur   . Arthritis   . Neuromuscular disorder   . Hypertension     hx of;but doesn't require meds anymore;has been off of x 60yr  . Cough   . Pneumonia     hx of;>38yr ago   . Headache(784.0)     occasionally  . Concussion as a teenager  slight  . Peripheral neuropathy   . Joint pain   . Joint swelling   . Dry skin   . Diarrhea     pt takes Reglan tid  . Urinary frequency   . Urinary urgency   . History of kidney stones   . Nocturia   . Hypothyroidism     takes Synthroid daily  . Diabetes mellitus     Novolog,Levemir,and Lovaza daily  . Depression     takes CYmbalta daily  . Peripheral edema     takes Lasix daily  . MRSA (methicillin resistant staph aureus) culture positive     hx of 2012  . Staph infection 2007    Assessment: 52yo female has previously drained abscess of right groin, continues to have pain and foul-smelling drainage, per CT soft tissue infection, to begin IV ABX while awaiting I&D.  Goal of Therapy:  Vancomycin trough level 10-15 mcg/ml  Plan:  Rec'd vanc 1g in ED; will give additional vancomycin 1g for total load of 2g then begin vancomycin 1g IV Q12H and monitor CBC, Cx, levels prn.  Vernard Gambles, PharmD, BCPS  01/23/2013,11:09 PM

## 2013-01-23 NOTE — ED Notes (Signed)
Pt states had boil to right groin area that she had lanced and drained on Saturday. States swelling, pain and drainage worse. Pt having hard time moving. Foul odor to area.

## 2013-01-23 NOTE — ED Provider Notes (Signed)
History     CSN: [id number]  Arrival date & time 01/23/13  1536   First MD Initiated Contact with Patient 01/23/13 1815      Chief Complaint  Patient presents with  . Abscess    (Consider location/radiation/quality/duration/timing/severity/associated sxs/prior treatment) HPI [name] is a 52 y.o. female with a history of diabetes that presents emergency department complaining of right inguinal abscess.  Patient states that the abscess was drained 5 days ago in the emergency department and has been actively draining, however the pain has gotten significantly worse and feels like "it is deep inside my bones now." Pain radiates throughout groin.  Patient reports being discharged on clindamycin and being compliant on his antibiotics. Pt denies fevers, night sweats, chills, spreading erythema, red streaking, numbness, tingling or weakness of extremity.    Past Medical History  Diagnosis Date  . Diabetic neuropathy   . Cellulitis   . Thyroid disease   . Hyperlipidemia     not on meds  . Heart murmur   . Arthritis   . Neuromuscular disorder   . Hypertension     hx of;but doesn't require meds anymore;has been off of x 24yr  . Cough   . Pneumonia     hx of;>70yr ago   . Headache(784.0)     occasionally  . Concussion as a teenager    slight  . Peripheral neuropathy   . Joint pain   . Joint swelling   . Dry skin   . Diarrhea     pt takes Reglan tid  . Urinary frequency   . Urinary urgency   . History of kidney stones   . Nocturia   . Hypothyroidism     takes Synthroid daily  . Diabetes mellitus     Novolog,Levemir,and Lovaza daily  . Depression     takes CYmbalta daily  . Peripheral edema     takes Lasix daily  . MRSA (methicillin resistant staph aureus) culture positive     hx of 2012  . Staph infection 2007    Past Surgical History  Procedure Laterality Date  . Amputation    . Leg amputation      right leg amputation below knee  . Toe amputation      left  foot only 2 toes remaning.   . Tubal ligation    . Foot surgery      right foot  . Wrist surgery      right tendon  . Dilation and curettage of uterus    . Refractive surgery      left    Family History  Problem Relation Age of Onset  . Hypertension Mother   . Hyperlipidemia Mother   . Hypertension Brother   . Hyperlipidemia Brother   . Cancer Maternal Aunt   . Diabetes Paternal Aunt   . Diabetes Paternal Uncle   . Diabetes Paternal Grandmother   . Anesthesia problems Neg Hx   . Hypotension Neg Hx   . Malignant hyperthermia Neg Hx   . Pseudochol deficiency Neg Hx     History  Substance Use Topics  . Smoking status: Current Every Day Smoker -- 1.00 packs/day for 31 years  . Smokeless tobacco: Never Used  . Alcohol Use: No    OB History   Grav Para Term Preterm Abortions TAB SAB Ect Mult Living                  Review of Systems Ten systems reviewed  and are negative for acute change, except as noted in the HPI.    Allergies  Gadolinium; Ivp dye; Metformin; and Penicillins  Home Medications   Current Outpatient Rx  Name  Route  Sig  Dispense  Refill  . atorvastatin (LIPITOR) 80 MG tablet   Oral   Take 80 mg by mouth daily.         . clindamycin (CLEOCIN) 150 MG capsule   Oral   Take 300 mg by mouth every 6 (six) hours. 7 day course (#56) started 01/18/13         . FLUoxetine (PROZAC) 20 MG tablet   Oral   Take 20 mg by mouth daily.         Marland Kitchen gabapentin (NEURONTIN) 800 MG tablet   Oral   Take 800 mg by mouth 3 (three) times daily.          Marland Kitchen gemfibrozil (LOPID) 600 MG tablet   Oral   Take 600 mg by mouth 2 (two) times daily before a meal.         . hydrochlorothiazide (MICROZIDE) 12.5 MG capsule   Oral   Take 12.5 mg by mouth daily.         Marland Kitchen HYDROcodone-acetaminophen (NORCO) 10-325 MG per tablet   Oral   Take 1 tablet by mouth 2 (two) times daily as needed. For pain         . insulin aspart (NOVOLOG) 100 UNIT/ML injection    Subcutaneous   Inject 0-48 Units into the skin 3 (three) times daily with meals. Per sliding scale provided by endocrinologist         . insulin detemir (LEVEMIR) 100 UNIT/ML injection   Subcutaneous   Inject 60 Units into the skin 2 (two) times daily.          Marland Kitchen levothyroxine (SYNTHROID, LEVOTHROID) 25 MCG tablet   Oral   Take 25 mcg by mouth every morning.          . metoCLOPramide (REGLAN) 5 MG tablet   Oral   Take 5 mg by mouth 3 (three) times daily.          Marland Kitchen omeprazole (PRILOSEC) 20 MG capsule   Oral   Take 20 mg by mouth daily.         Marland Kitchen oxybutynin (DITROPAN) 5 MG tablet   Oral   Take 5 mg by mouth every morning.           Marland Kitchen oxyCODONE-acetaminophen (PERCOCET/ROXICET) 5-325 MG per tablet   Oral   Take 1 tablet by mouth every 4 (four) hours as needed for pain.         Marland Kitchen ondansetron (ZOFRAN ODT) 4 MG disintegrating tablet   Oral   Take 1 tablet (4 mg total) by mouth every 8 (eight) hours as needed for nausea.   20 tablet   0     BP 144/64  Pulse 79  Temp(Src) 97.5 F (36.4 C)  Resp 16  SpO2 92%  Physical Exam  Nursing note and vitals reviewed. Constitutional: She is oriented to person, place, and time. She appears well-developed and well-nourished. She does not have a sickly appearance. She does not appear ill. She appears distressed.  HENT:  Head: Normocephalic and atraumatic.  Mouth/Throat: No oropharyngeal exudate.  Eyes: Conjunctivae and EOM are normal.  Neck: Normal range of motion. Neck supple.  Cardiovascular: Normal rate and regular rhythm.   Pulmonary/Chest: Effort normal and breath sounds normal.  Musculoskeletal: She exhibits no  edema.  Right BKA, missing multiple toes left foot  Lymphadenopathy:       Head (right side): No submental, no preauricular and no posterior auricular adenopathy present.       Head (left side): No submental, no submandibular, no preauricular and no posterior auricular adenopathy present.    She has no  axillary adenopathy.  Neurological: She is alert and oriented to person, place, and time.  Skin: Skin is warm and dry. No rash noted. She is not diaphoretic.  4x4cm sized abscess located on right inguinal area just lateral to labial fold. Extreme tenderness to palpation. Not currently draining.     ED Course  Procedures (including critical care time)  Labs Reviewed  CBC WITH DIFFERENTIAL - Abnormal; Notable for the following:    WBC 11.0 (*)    Hemoglobin 11.3 (*)    HCT 33.4 (*)    All other components within normal limits  BASIC METABOLIC PANEL - Abnormal; Notable for the following:    Sodium 133 (*)    Chloride 91 (*)    Glucose, Bld 298 (*)    BUN 35 (*)    GFR calc non Af Amer 62 (*)    GFR calc Af Amer 72 (*)    All other components within normal limits   Ct Pelvis W Contrast  01/23/2013   *RADIOLOGY REPORT*  Clinical Data:  Right groin boil lanced 5 days ago with persistent pain and drainage from the area  CT PELVIS WITH CONTRAST  Technique:  Multidetector CT imaging of the pelvis was performed using the standard protocol following the bolus administration of intravenous contrast.  Contrast: OMNIPAQUE IOHEXOL 300 MG/ML  SOLN  Comparison:   None.  Findings:  Visualized bowel is unremarkable.  Uterus and ovaries are normal.  Bladder is normal.  No intrapelvic lymphadenopathy or free fluid.  Prominent right groin lymph nodes are noted measuring 1.4 cm in maximal short axis diameter image 39.  There is fusiform enlargement of the gracilis muscle and adductor longus muscle on the right, with overlying subcutaneous soft tissue edema.  Areas of hypo enhancement are noted within these muscles with mild peripheral enhancement which may indicate abscess formation, measuring 1.7 cm within the abductor longus muscle and 1.6 cm within the gracilis muscle.  There is minimal fluid overlying the gracilis muscle subjacent to the dermis but this itself is likely too small for percutaneous  drainage, for example image 55 measuring 8 mm.  No acute osseous abnormality.  IMPRESSION: Fusiform enlargement of the right adductor longus and gracilis muscles with overlying skin edema and fluid most compatible with cellulitis and myositis with internal areas of probable abscess formation.  No subcutaneous or dermal drainable fluid collection.   Original Report Authenticated By: Christiana Pellant, M.D.     No diagnosis found.  Consult orthopedic: Dr. Yisroel Ramming, defers referral to general surgery Consult surgery: Dr. Lindie Spruce, Will likely take to surgery tomorrow, advised hospital admission for over night   MDM  Patient is a 52 year old female with diabetes that presents emergency department 5 days status post incision and drainage of inguinal abscess due to worsening pain since drainage there is some concern for possibly necrotizing fasciitis versus deeper abscess. Note reports abx compliance (clindamycin).  CT pelvis with IV contrast ordered at this time.  Patient's notes indicate that she has a dye allergy , however the reaction was nausea and vomiting after oral contrast.  Zofran and Benadryl given prior to IV contrast.  Discussed with radiologist  who agrees.  Cellulitis & Myositis with probable underlying abscess.  Consult orthopedics who deferred to general surgery as above.  Patient started on IV vancomycin and continues to appear nontoxic and nonseptic appearing with normal vital signs.        Jaci Carrel, [place] 01/26/13 [phone]

## 2013-01-24 ENCOUNTER — Encounter (HOSPITAL_COMMUNITY): Payer: Self-pay | Admitting: Anesthesiology

## 2013-01-24 ENCOUNTER — Inpatient Hospital Stay (HOSPITAL_COMMUNITY): Payer: Medicaid Other | Admitting: Critical Care Medicine

## 2013-01-24 ENCOUNTER — Encounter (HOSPITAL_COMMUNITY): Payer: Self-pay | Admitting: Critical Care Medicine

## 2013-01-24 ENCOUNTER — Encounter (HOSPITAL_COMMUNITY)
Admission: EM | Disposition: A | Discharge status: Skilled Nursing Facility | Payer: Self-pay | Source: Home / Self Care | Attending: Internal Medicine

## 2013-01-24 DIAGNOSIS — E871 Hypo-osmolality and hyponatremia: Secondary | ICD-10-CM

## 2013-01-24 DIAGNOSIS — E1165 Type 2 diabetes mellitus with hyperglycemia: Secondary | ICD-10-CM

## 2013-01-24 DIAGNOSIS — N179 Acute kidney failure, unspecified: Secondary | ICD-10-CM | POA: Diagnosis present

## 2013-01-24 DIAGNOSIS — IMO0002 Reserved for concepts with insufficient information to code with codable children: Secondary | ICD-10-CM

## 2013-01-24 HISTORY — PX: INCISION AND DRAINAGE ABSCESS: SHX5864

## 2013-01-24 LAB — CULTURE, BLOOD (ROUTINE X 2)

## 2013-01-24 LAB — GLUCOSE, CAPILLARY
Glucose-Capillary: 320 mg/dL — ABNORMAL HIGH (ref 70–99)
Glucose-Capillary: 206 mg/dL — ABNORMAL HIGH (ref 70–99)
Glucose-Capillary: 231 mg/dL — ABNORMAL HIGH (ref 70–99)
Glucose-Capillary: 346 mg/dL — ABNORMAL HIGH (ref 70–99)

## 2013-01-24 LAB — HEMOGLOBIN A1C: Hgb A1c MFr Bld: 12.9 % — ABNORMAL HIGH (ref <5.7)

## 2013-01-24 LAB — SURGICAL PCR SCREEN
MRSA, PCR: NEGATIVE
Staphylococcus aureus: POSITIVE — AB

## 2013-01-24 LAB — COMPREHENSIVE METABOLIC PANEL
ALT: 11 U/L (ref 0–35)
BUN: 37 mg/dL — ABNORMAL HIGH (ref 6–23)
CO2: 24 mEq/L (ref 19–32)
Calcium: 9.3 mg/dL (ref 8.4–10.5)
GFR calc Af Amer: 59 mL/min — ABNORMAL LOW (ref >90)
GFR calc non Af Amer: 51 mL/min — ABNORMAL LOW (ref >90)
Glucose, Bld: 355 mg/dL — ABNORMAL HIGH (ref 70–99)
Sodium: 129 mEq/L — ABNORMAL LOW (ref 135–145)
Total Protein: 6.8 g/dL (ref 6.0–8.3)

## 2013-01-24 LAB — CBC
Hemoglobin: 11.2 g/dL — ABNORMAL LOW (ref 12.0–15.0)
MCHC: 34.5 g/dL (ref 30.0–36.0)
RDW: 13.5 % (ref 11.5–15.5)
WBC: 10.5 10*3/uL (ref 4.0–10.5)

## 2013-01-24 SURGERY — INCISION AND DRAINAGE, ABSCESS
Anesthesia: General | Site: Groin | Wound class: Dirty or Infected

## 2013-01-24 MED ORDER — MIDAZOLAM HCL 5 MG/5ML IJ SOLN
INTRAMUSCULAR | Status: DC | PRN
  Administered 2013-01-24: 1 mg via INTRAVENOUS

## 2013-01-24 MED ORDER — ARTIFICIAL TEARS OP OINT
TOPICAL_OINTMENT | OPHTHALMIC | Status: DC | PRN
  Administered 2013-01-24: 1 via OPHTHALMIC

## 2013-01-24 MED ORDER — OXYCODONE HCL 5 MG PO TABS
5.0000 mg | ORAL_TABLET | Freq: Once | ORAL | Status: AC | PRN
  Filled 2013-01-24: qty 1

## 2013-01-24 MED ORDER — GABAPENTIN 300 MG PO CAPS
300.0000 mg | ORAL_CAPSULE | Freq: Three times a day (TID) | ORAL | Status: DC
  Administered 2013-01-24 – 2013-01-25 (×5): 300 mg via ORAL
  Filled 2013-01-24 (×10): qty 1

## 2013-01-24 MED ORDER — LACTATED RINGERS IV SOLN
INTRAVENOUS | Status: DC
  Administered 2013-01-24: 13:00:00 via INTRAVENOUS

## 2013-01-24 MED ORDER — SUCCINYLCHOLINE CHLORIDE 20 MG/ML IJ SOLN
INTRAMUSCULAR | Status: DC | PRN
  Administered 2013-01-24: 100 mg via INTRAVENOUS

## 2013-01-24 MED ORDER — PROPOFOL 10 MG/ML IV BOLUS
INTRAVENOUS | Status: DC | PRN
  Administered 2013-01-24: 200 mg via INTRAVENOUS

## 2013-01-24 MED ORDER — ONDANSETRON HCL 4 MG/2ML IJ SOLN: 4.0000 mg | Freq: Once | INTRAMUSCULAR | Status: AC | PRN

## 2013-01-24 MED ORDER — IPRATROPIUM-ALBUTEROL 20-100 MCG/ACT IN AERS
INHALATION_SPRAY | RESPIRATORY_TRACT | Status: DC | PRN
  Administered 2013-01-24 (×3): 2 via RESPIRATORY_TRACT

## 2013-01-24 MED ORDER — HYDROMORPHONE HCL PF 1 MG/ML IJ SOLN: 0.2500 mg | INTRAMUSCULAR | Status: DC | PRN

## 2013-01-24 MED ORDER — ONDANSETRON HCL 4 MG/2ML IJ SOLN
INTRAMUSCULAR | Status: DC | PRN
  Administered 2013-01-24: 4 mg via INTRAVENOUS

## 2013-01-24 MED ORDER — MEPERIDINE HCL 25 MG/ML IJ SOLN: 6.2500 mg | INTRAMUSCULAR | Status: DC | PRN

## 2013-01-24 MED ORDER — HYDROMORPHONE HCL PF 1 MG/ML IJ SOLN: 2.0000 mg | INTRAMUSCULAR | Status: AC | PRN

## 2013-01-24 MED ORDER — LACTATED RINGERS IV SOLN
INTRAVENOUS | Status: DC | PRN
  Administered 2013-01-24: 13:00:00 via INTRAVENOUS

## 2013-01-24 MED ORDER — LIDOCAINE HCL (CARDIAC) 20 MG/ML IV SOLN
INTRAVENOUS | Status: DC | PRN
  Administered 2013-01-24: 100 mg via INTRAVENOUS

## 2013-01-24 MED ORDER — FENTANYL CITRATE 0.05 MG/ML IJ SOLN
INTRAMUSCULAR | Status: DC | PRN
  Administered 2013-01-24: 50 ug via INTRAVENOUS
  Administered 2013-01-24: 100 ug via INTRAVENOUS

## 2013-01-24 MED ORDER — 0.9 % SODIUM CHLORIDE (POUR BTL) OPTIME
TOPICAL | Status: DC | PRN
  Administered 2013-01-24: 1000 mL

## 2013-01-24 MED ORDER — OXYCODONE HCL 5 MG/5ML PO SOLN: 5.0000 mg | Freq: Once | ORAL | Status: AC | PRN

## 2013-01-24 SURGICAL SUPPLY — 35 items
BANDAGE GAUZE ELAST BULKY 4 IN (GAUZE/BANDAGES/DRESSINGS) ×2 IMPLANT
CANISTER SUCTION 2500CC (MISCELLANEOUS) ×2 IMPLANT
CLOTH BEACON ORANGE TIMEOUT ST (SAFETY) ×2 IMPLANT
COVER SURGICAL LIGHT HANDLE (MISCELLANEOUS) ×2 IMPLANT
DRAPE LAPAROSCOPIC ABDOMINAL (DRAPES) ×2 IMPLANT
DRAPE UTILITY 15X26 W/TAPE STR (DRAPE) ×4 IMPLANT
DRSG PAD ABDOMINAL 8X10 ST (GAUZE/BANDAGES/DRESSINGS) IMPLANT
ELECT CAUTERY BLADE 6.4 (BLADE) ×2 IMPLANT
ELECT REM PT RETURN 9FT ADLT (ELECTROSURGICAL) ×2
ELECTRODE REM PT RTRN 9FT ADLT (ELECTROSURGICAL) ×1 IMPLANT
GLOVE BIO SURGEON STRL SZ 6.5 (GLOVE) ×2 IMPLANT
GLOVE BIO SURGEON STRL SZ8 (GLOVE) ×2 IMPLANT
GLOVE BIOGEL PI IND STRL 6.5 (GLOVE) ×1 IMPLANT
GLOVE BIOGEL PI IND STRL 7.0 (GLOVE) ×1 IMPLANT
GLOVE BIOGEL PI IND STRL 8 (GLOVE) ×1 IMPLANT
GLOVE BIOGEL PI INDICATOR 6.5 (GLOVE) ×1
GLOVE BIOGEL PI INDICATOR 7.0 (GLOVE) ×1
GLOVE BIOGEL PI INDICATOR 8 (GLOVE) ×1
GLOVE SS BIOGEL STRL SZ 6.5 (GLOVE) ×1 IMPLANT
GLOVE SUPERSENSE BIOGEL SZ 6.5 (GLOVE) ×1
GOWN STRL NON-REIN LRG LVL3 (GOWN DISPOSABLE) ×2 IMPLANT
GOWN STRL REIN XL XLG (GOWN DISPOSABLE) ×2 IMPLANT
KIT BASIN OR (CUSTOM PROCEDURE TRAY) ×2 IMPLANT
KIT ROOM TURNOVER OR (KITS) ×2 IMPLANT
LEGGING LITHOTOMY PAIR STRL (DRAPES) ×2 IMPLANT
NS IRRIG 1000ML POUR BTL (IV SOLUTION) ×2 IMPLANT
PACK GENERAL/GYN (CUSTOM PROCEDURE TRAY) ×2 IMPLANT
PAD ARMBOARD 7.5X6 YLW CONV (MISCELLANEOUS) ×4 IMPLANT
SPECIMEN JAR SMALL (MISCELLANEOUS) IMPLANT
SPONGE GAUZE 4X4 12PLY (GAUZE/BANDAGES/DRESSINGS) ×2 IMPLANT
SWAB COLLECTION DEVICE MRSA (MISCELLANEOUS) ×2 IMPLANT
TAPE CLOTH SURG 6X10 WHT LF (GAUZE/BANDAGES/DRESSINGS) ×2 IMPLANT
TOWEL OR 17X24 6PK STRL BLUE (TOWEL DISPOSABLE) ×2 IMPLANT
TOWEL OR 17X26 10 PK STRL BLUE (TOWEL DISPOSABLE) ×2 IMPLANT
TUBE ANAEROBIC SPECIMEN COL (MISCELLANEOUS) ×2 IMPLANT

## 2013-01-24 NOTE — Anesthesia Postprocedure Evaluation (Signed)
Anesthesia Post Note  Patient: [name]  Procedure(s) Performed: Procedure(s) (LRB): INCISION AND DRAINAGE ABSCESS (N/A)  Anesthesia type: general  Patient location: PACU  Post pain: Pain level controlled  Post assessment: Patient's Cardiovascular Status Stable  Last Vitals:  Filed Vitals:   01/24/13 1530  BP:   Pulse:   Temp: 36.8 C  Resp:     Post vital signs: Reviewed and stable  Level of consciousness: sedated  Complications: No apparent anesthesia complications

## 2013-01-24 NOTE — Progress Notes (Signed)
[facility] PROGRESS NOTE  KIMBERL VIG ZOX:[id number] DOB: 03-11-1961 DOA: 01/23/2013  PCP: [name], MD Endocrinologist: Dr. Sharl Ma  Brief HPI: Pt is 52 y.o. female with a history of diabetes mellitus, insulin dependent who present to [facility] ED with main concerns persistent right inguinal abscess which was drained 5 days prior to this admission in the emergency department, and continues to drain yellow pus, she was discharged on Clindamycin. She explained that it has not been getting much better. She described throbbing pain in the right inguinal area, intermittent and 5/10 in severity, radiating throughout groin area, no specific alleviating or aggravating factors. Pt denied fevers, chills, other systemic concerns. Surgery was consulted and patient was admitted to the hospital.   Past medical history:  Past Medical History  Diagnosis Date  . Diabetic neuropathy   . Cellulitis   . Thyroid disease   . Hyperlipidemia     not on meds  . Heart murmur   . Arthritis   . Neuromuscular disorder   . Hypertension     hx of;but doesn't require meds anymore;has been off of x 23yr  . Cough   . Pneumonia     hx of;>70yr ago   . Headache(784.0)     occasionally  . Concussion as a teenager    slight  . Peripheral neuropathy   . Joint pain   . Joint swelling   . Dry skin   . Diarrhea     pt takes Reglan tid  . Urinary frequency   . Urinary urgency   . History of kidney stones   . Nocturia   . Hypothyroidism     takes Synthroid daily  . Diabetes mellitus     Novolog,Levemir,and Lovaza daily  . Depression     takes CYmbalta daily  . Peripheral edema     takes Lasix daily  . MRSA (methicillin resistant staph aureus) culture positive     hx of 2012  . Staph infection 2007    Consultants: Gen Surgery  Procedures: None yet. Possible I&D later today.  Antibiotics: IV Vanc 6/5--> IV Ertapenem 6/5-->  Subjective: Patient very sleepy but easily arousable. Per Rn she  received Dilaudid earlier this morning. Denies any pain currently. Concerned about the abscess.  Objective: Vital Signs  Filed Vitals:   01/23/13 2304 01/23/13 2315 01/23/13 2359 01/24/13 0546  BP: 132/64  152/67 152/64  Pulse: 80 83 79 84  Temp:   98 F (36.7 C) 99 F (37.2 C)  TempSrc:   Oral   Resp:   16 16  Height:   5\' 5"  (1.651 m)   Weight:   105 kg (231 lb 7.7 oz)   SpO2: 92% 90% 95% 92%    Intake/Output Summary (Last 24 hours) at 01/24/13 0737 Last data filed at 01/24/13 0600  Gross per 24 hour  Intake    570 ml  Output      0 ml  Net    570 ml   Filed Weights   01/23/13 2300 01/23/13 2359  Weight: 105 kg (231 lb 7.7 oz) 105 kg (231 lb 7.7 oz)   General appearance: cooperative, no distress and moderately obese Head: Normocephalic, without obvious abnormality, atraumatic Throat: Dry Mucus membranes Back: symmetric, no curvature. ROM normal. No CVA tenderness. Resp: clear to auscultation bilaterally Cardio: regular rate and rhythm, S1, S2 normal. Systolic murmur in mitral area (old per patient). no click, rub or gallop GI: soft, non-tender; bowel sounds normal; no masses,  no organomegaly Extremities: Right groin area covered with dressing. Erythema is noted. Tender to palpation. Extension to upper thigh is noted. She is s/p BKA on right. Pulses: 2+ and symmetric Skin: erythema in righ groin area Lymph nodes: Inguinal adenopathy: palpable in right groin Neurologic: Somnolent but arousable. Oriented x 3. No focal deficits.  Lab Results:  Basic Metabolic Panel:  Recent Labs Lab 01/18/13 0556 01/23/13 1903 01/23/13 2258 01/24/13 0555  NA 130* 133* 131* 129*  K 3.7 4.0 3.5 3.7  CL 86* 91* 90* 90*  CO2 25 30 27 24   GLUCOSE 449* 298* 316* 355*  BUN 30* 35* 34* 37*  CREATININE 1.10 1.03 0.93 1.21*  CALCIUM 9.7 9.8 10.0 9.3  MG  --   --  1.8  --   PHOS  --   --  4.2  --    Liver Function Tests:  Recent Labs Lab 01/18/13 0556 01/23/13 2258  01/24/13 0555  AST 14 11 11   ALT 12 11 11   ALKPHOS 125* 140* 155*  BILITOT 0.4 0.2* 0.1*  PROT 8.0 7.3 6.8  ALBUMIN 2.8* 2.0* 1.6*   CBC:  Recent Labs Lab 01/18/13 0556 01/23/13 1903 01/23/13 2258 01/24/13 0555  WBC 12.8* 11.0* 12.4* 10.5  NEUTROABS 9.4* 7.1 8.1*  --   HGB 12.8 11.3* 11.9* 11.2*  HCT 36.5 33.4* 33.8* 32.5*  MCV 83.0 82.7 81.6 82.1  PLT 242 309 334 308   CBG:  Recent Labs Lab 01/18/13 0848 01/18/13 0948 01/18/13 1101 01/23/13 2355 01/24/13 0633  GLUCAP 413* 348* 319* 294* 320*    Recent Results (from the past 240 hour(s))  CULTURE, ROUTINE-ABSCESS     Status: None   Collection Time    01/18/13  5:48 AM      Result Value Range Status   Specimen Description ABSCESS   Final   Special Requests GROIN RIGHT   Final   Gram Stain     Final   Value: NO WBC SEEN     NO SQUAMOUS EPITHELIAL CELLS SEEN     MODERATE GRAM POSITIVE COCCI     IN PAIRS IN CHAINS   Culture     Final   Value: MODERATE GROUP B STREP(S.AGALACTIAE)ISOLATED     Note: TESTING AGAINST S. AGALACTIAE NOT ROUTINELY PERFORMED DUE TO PREDICTABILITY OF AMP/PEN/VAN SUSCEPTIBILITY.   Report Status 01/21/2013 FINAL   Final  CULTURE, BLOOD (ROUTINE X 2)     Status: None   Collection Time    01/18/13  6:45 AM      Result Value Range Status   Specimen Description BLOOD LEFT ARM   Final   Special Requests BOTTLES DRAWN AEROBIC AND ANAEROBIC 10CC   Final   Culture  Setup Time 01/18/2013 11:19   Final   Culture     Final   Value:        BLOOD CULTURE RECEIVED NO GROWTH TO DATE CULTURE WILL BE HELD FOR 5 DAYS BEFORE ISSUING A FINAL NEGATIVE REPORT   Report Status PENDING   Incomplete  CULTURE, BLOOD (ROUTINE X 2)     Status: None   Collection Time    01/18/13  6:52 AM      Result Value Range Status   Specimen Description BLOOD LEFT HAND   Final   Special Requests BOTTLES DRAWN AEROBIC AND ANAEROBIC 10CC   Final   Culture  Setup Time 01/18/2013 11:19   Final   Culture     Final   Value:  BLOOD CULTURE RECEIVED NO GROWTH TO DATE CULTURE WILL BE HELD FOR 5 DAYS BEFORE ISSUING A FINAL NEGATIVE REPORT   Report Status PENDING   Incomplete      Studies/Results: Ct Pelvis W Contrast  01/23/2013   *RADIOLOGY REPORT*  Clinical Data:  Right groin boil lanced 5 days ago with persistent pain and drainage from the area  CT PELVIS WITH CONTRAST  Technique:  Multidetector CT imaging of the pelvis was performed using the standard protocol following the bolus administration of intravenous contrast.  Contrast: OMNIPAQUE IOHEXOL 300 MG/ML  SOLN  Comparison:   None.  Findings:  Visualized bowel is unremarkable.  Uterus and ovaries are normal.  Bladder is normal.  No intrapelvic lymphadenopathy or free fluid.  Prominent right groin lymph nodes are noted measuring 1.4 cm in maximal short axis diameter image 39.  There is fusiform enlargement of the gracilis muscle and adductor longus muscle on the right, with overlying subcutaneous soft tissue edema.  Areas of hypo enhancement are noted within these muscles with mild peripheral enhancement which may indicate abscess formation, measuring 1.7 cm within the abductor longus muscle and 1.6 cm within the gracilis muscle.  There is minimal fluid overlying the gracilis muscle subjacent to the dermis but this itself is likely too small for percutaneous drainage, for example image 55 measuring 8 mm.  No acute osseous abnormality.  IMPRESSION: Fusiform enlargement of the right adductor longus and gracilis muscles with overlying skin edema and fluid most compatible with cellulitis and myositis with internal areas of probable abscess formation.  No subcutaneous or dermal drainable fluid collection.   Original Report Authenticated By: Christiana Pellant, M.D.    Medications:  Scheduled: . atorvastatin  80 mg Oral q1800  . [START ON 01/25/2013] ertapenem  1 g Intravenous Q24H  . FLUoxetine  20 mg Oral Daily  . gabapentin  800 mg Oral TID  . gemfibrozil  600 mg Oral  BID AC  . hydrochlorothiazide  12.5 mg Oral Daily  . insulin aspart  0-15 Units Subcutaneous TID WC  . insulin aspart  0-5 Units Subcutaneous QHS  . insulin detemir  60 Units Subcutaneous BID  . levothyroxine  25 mcg Oral QAC breakfast  . metoCLOPramide  5 mg Oral TID WC  . oxybutynin  5 mg Oral Daily  . pantoprazole  40 mg Oral Daily  . vancomycin  1,000 mg Intravenous Q12H   Continuous: . sodium chloride 75 mL/hr at 01/24/13 0023   ZOX:[id number], acetaminophen, hydrALAZINE, HYDROmorphone (DILAUDID) injection, ondansetron (ZOFRAN) IV, ondansetron, oxyCODONE-acetaminophen  Assessment/Plan:  Principal Problem:   Abscess of groin, right Active Problems:   Diabetes mellitus type 2, uncontrolled, with complications   DEPRESSION   PERIPHERAL NEUROPATHY   HYPERCHOLESTEROLEMIA   Hyponatremia   HTN (hypertension)   Leukocytosis   Anemia of chronic disease   Hypothyroidism    Abscess of groin, right  Appreciate surgery input. For OR later today. Will make NPO. Continue Vanc and Ertapenem. Blood cultures from 5/31 shows no growth. Strep noted from abscess.   DIABETES MELLITUS, TYPE II  Poorly controlled. Continue Levemir for now. Will not adjust dose despite NPO status as CBG are high and she probably needs current dose of Levemir. HBA1C is pending. If CBG continue to remain high, may need IV insulin.   DEPRESSION  Appears stable at this time, normal affect. Continue Fluoxetine.  PERIPHERAL NEUROPATHY  Complication of diabetes, continue Neurontin. But will decrease dose to avoid excessive sedation.   Hyponatremia  Most likely due to hypovolemia. Increase rate of IV fluids. Stop diuretics.  Mild ARF Increase IVF. Monitor renal function closely.  HTN (hypertension)  Continue to monitor closely.   Anemia of chronic disease  Hg and Hct stable and at pt's baseline.   Code Status:  Full Code DVT Prophylaxis:   Start Enoxaparin post procedure Family Communication:  Discussed with patient  Disposition Plan: For I&D later today.    LOS: 1 day   [facility]  [facility] Pager [phone] 01/24/2013, 7:37 AM  If 8PM-8AM, please contact night-coverage at [email or web address], password [facility]

## 2013-01-24 NOTE — Progress Notes (Signed)
ANTIBIOTIC CONSULT NOTE - INITIAL  Pharmacy Consult for Ertapenem  Indication: groin infection  Allergies  Allergen Reactions  . Gadolinium      Code: VOM, Desc: Pt began vomiting immed post infusion of multihance, Onset Date: [id number]   . Ivp Dye (Iodinated Diagnostic Agents) Nausea And Vomiting  . Metformin Other (See Comments)     diarrhea  . Penicillins Hives    Patient Measurements: Height: 5\' 5"  (165.1 cm) Weight: 231 lb 7.7 oz (105 kg) IBW/kg (Calculated) : 57  Vital Signs: Temp: 99 F (37.2 C) (06/06 0546) Temp src: Oral (06/05 2359) BP: 152/64 mmHg (06/06 0546) Pulse Rate: 84 (06/06 0546)  Labs:  Recent Labs  01/23/13 1903 01/23/13 2258 01/24/13 0555  WBC 11.0* 12.4* 10.5  HGB 11.3* 11.9* 11.2*  PLT 309 334 308  CREATININE 1.03 0.93 1.21*   Estimated Creatinine Clearance: 66.2 ml/min (by C-G formula based on Cr of 1.21).   Microbiology: Recent Results (from the past 720 hour(s))  CULTURE, ROUTINE-ABSCESS     Status: None   Collection Time    01/18/13  5:48 AM      Result Value Range Status   Specimen Description ABSCESS   Final   Special Requests GROIN RIGHT   Final   Gram Stain     Final   Value: NO WBC SEEN     NO SQUAMOUS EPITHELIAL CELLS SEEN     MODERATE GRAM POSITIVE COCCI     IN PAIRS IN CHAINS   Culture     Final   Value: MODERATE GROUP B STREP(S.AGALACTIAE)ISOLATED     Note: TESTING AGAINST S. AGALACTIAE NOT ROUTINELY PERFORMED DUE TO PREDICTABILITY OF AMP/PEN/VAN SUSCEPTIBILITY.   Report Status 01/21/2013 FINAL   Final  CULTURE, BLOOD (ROUTINE X 2)     Status: None   Collection Time    01/18/13  6:45 AM      Result Value Range Status   Specimen Description BLOOD LEFT ARM   Final   Special Requests BOTTLES DRAWN AEROBIC AND ANAEROBIC 10CC   Final   Culture  Setup Time 01/18/2013 11:19   Final   Culture     Final   Value:        BLOOD CULTURE RECEIVED NO GROWTH TO DATE CULTURE WILL BE HELD FOR 5 DAYS BEFORE ISSUING A FINAL NEGATIVE  REPORT   Report Status PENDING   Incomplete  CULTURE, BLOOD (ROUTINE X 2)     Status: None   Collection Time    01/18/13  6:52 AM      Result Value Range Status   Specimen Description BLOOD LEFT HAND   Final   Special Requests BOTTLES DRAWN AEROBIC AND ANAEROBIC 10CC   Final   Culture  Setup Time 01/18/2013 11:19   Final   Culture     Final   Value:        BLOOD CULTURE RECEIVED NO GROWTH TO DATE CULTURE WILL BE HELD FOR 5 DAYS BEFORE ISSUING A FINAL NEGATIVE REPORT   Report Status PENDING   Incomplete    Medical History: Past Medical History  Diagnosis Date  . Diabetic neuropathy   . Cellulitis   . Thyroid disease   . Hyperlipidemia     not on meds  . Heart murmur   . Arthritis   . Neuromuscular disorder   . Hypertension     hx of;but doesn't require meds anymore;has been off of x 41yr  . Cough   . Pneumonia  hx of;>52yr ago   . Headache(784.0)     occasionally  . Concussion as a teenager    slight  . Peripheral neuropathy   . Joint pain   . Joint swelling   . Dry skin   . Diarrhea     pt takes Reglan tid  . Urinary frequency   . Urinary urgency   . History of kidney stones   . Nocturia   . Hypothyroidism     takes Synthroid daily  . Diabetes mellitus     Novolog,Levemir,and Lovaza daily  . Depression     takes CYmbalta daily  . Peripheral edema     takes Lasix daily  . MRSA (methicillin resistant staph aureus) culture positive     hx of 2012  . Staph infection 2007    Assessment: Pharmacy asked to manage ertapenem as well for this 52yo female who has previously drained abscess of right groin and continued to have pain and foul-smelling drainage, per CT soft tissue infection. Now receiving  IV vancomycin and ertapenem 1g IV q24h.  CrCl is ~ 66 ml/min. No adjustment in ertapenem dose necessary at this time.     Plan:  Continue ertapenem 1 g IV q24h. Continue vancomycin 1g IV Q12H and monitor CBC, Cx, levels prn.  Noah Delaine, RPh Clinical  Pharmacist Pager: [phone] 01/24/2013,8:29 AM

## 2013-01-24 NOTE — Progress Notes (Signed)
UR COMPLETED  

## 2013-01-24 NOTE — Interval H&P Note (Signed)
History and Physical Interval Note:  01/24/2013 12:26 PM  [name]  has presented today for surgery, with the diagnosis of groin abscess  The various methods of treatment have been discussed with the patient and family. After consideration of risks, benefits and other options for treatment, the patient has consented to  Procedure(s): INCISION AND DRAINAGE ABSCESS (N/A) as a surgical intervention .  The patient's history has been reviewed, patient examined, no change in status, stable for surgery.  I have reviewed the patient's chart and labs.  Questions were answered to the patient's satisfaction.     Jamaree Hosier A.

## 2013-01-24 NOTE — Progress Notes (Signed)
PT Cancellation Note  Patient Details Name: [name] MRN: [id number] DOB: 1961-02-24   Cancelled Treatment:    Reason Eval/Treat Not Completed: Patient at procedure or test/unavailable;Other (comment) (pt. currently being transported to OR for surgery)   Ferman Hamming 01/24/2013, 11:56 AM Weldon Picking PT Acute Rehab Services [phone] Beeper [phone]

## 2013-01-24 NOTE — Progress Notes (Signed)
Subjective: Pt sleepy.  No complaints.  Objective: Vital signs in last 24 hours: Temp:  [97.5 F (36.4 C)-99 F (37.2 C)] 99 F (37.2 C) (06/06 0546) Pulse Rate:  [77-95] 84 (06/06 0546) Resp:  [16-18] 16 (06/06 0546) BP: (132-164)/(64-83) 152/64 mmHg (06/06 0546) SpO2:  [86 %-95 %] 92 % (06/06 0546) Weight:  [231 lb 7.7 oz (105 kg)] 231 lb 7.7 oz (105 kg) (06/05 2359) Last BM Date:  (pta)  Intake/Output from previous day: 06/05 0701 - 06/06 0700 In: 570 [I.V.:570] Out: -  Intake/Output this shift:    Incision/Wound:roght groin open draining wound.  Foul smelling.  Lab Results:   Recent Labs  01/23/13 2258 01/24/13 0555  WBC 12.4* 10.5  HGB 11.9* 11.2*  HCT 33.8* 32.5*  PLT 334 308   BMET  Recent Labs  01/23/13 2258 01/24/13 0555  NA 131* 129*  K 3.5 3.7  CL 90* 90*  CO2 27 24  GLUCOSE 316* 355*  BUN 34* 37*  CREATININE 0.93 1.21*  CALCIUM 10.0 9.3   PT/INR  Recent Labs  01/23/13 2258  LABPROT 14.8  INR 1.18   ABG No results found for this basename: PHART, PCO2, PO2, HCO3,  in the last 72 hours  Studies/Results: Ct Pelvis W Contrast  01/23/2013   *RADIOLOGY REPORT*  Clinical Data:  Right groin boil lanced 5 days ago with persistent pain and drainage from the area  CT PELVIS WITH CONTRAST  Technique:  Multidetector CT imaging of the pelvis was performed using the standard protocol following the bolus administration of intravenous contrast.  Contrast: 100mL OMNIPAQUE IOHEXOL 300 MG/ML  SOLN  Comparison:   None.  Findings:  Visualized bowel is unremarkable.  Uterus and ovaries are normal.  Bladder is normal.  No intrapelvic lymphadenopathy or free fluid.  Prominent right groin lymph nodes are noted measuring 1.4 cm in maximal short axis diameter image 39.  There is fusiform enlargement of the gracilis muscle and adductor longus muscle on the right, with overlying subcutaneous soft tissue edema.  Areas of hypo enhancement are noted within these muscles  with mild peripheral enhancement which may indicate abscess formation, measuring 1.7 cm within the abductor longus muscle and 1.6 cm within the gracilis muscle.  There is minimal fluid overlying the gracilis muscle subjacent to the dermis but this itself is likely too small for percutaneous drainage, for example image 55 measuring 8 mm.  No acute osseous abnormality.  IMPRESSION: Fusiform enlargement of the right adductor longus and gracilis muscles with overlying skin edema and fluid most compatible with cellulitis and myositis with internal areas of probable abscess formation.  No subcutaneous or dermal drainable fluid collection.   Original Report Authenticated By: Gretchen Green, M.D.    Anti-infectives: Anti-infectives   Start     Dose/Rate Route Frequency Ordered Stop   01/25/13 0000  ertapenem (INVANZ) 1 g in sodium chloride 0.9 % 50 mL IVPB     1 g 100 mL/hr over 30 Minutes Intravenous Every 24 hours 01/23/13 2307     01/24/13 1000  vancomycin (VANCOCIN) IVPB 1000 mg/200 mL premix     1,000 mg 200 mL/hr over 60 Minutes Intravenous Every 12 hours 01/23/13 2309     01/23/13 2315  vancomycin (VANCOCIN) IVPB 1000 mg/200 mL premix     1,000 mg 200 mL/hr over 60 Minutes Intravenous  Once 01/23/13 2309 01/24/13 0314   01/23/13 2300  ertapenem (INVANZ) 1 g in sodium chloride 0.9 % 50 mL IVPB       1 g 100 mL/hr over 30 Minutes Intravenous  Once 01/23/13 2252 01/24/13 0053   01/23/13 2200  vancomycin (VANCOCIN) IVPB 1000 mg/200 mL premix     1,000 mg 200 mL/hr over 60 Minutes Intravenous  Once 01/23/13 2146 01/23/13 2311      Assessment/Plan: right groin abscess OR for debridement today.  D/W pt.  The procedure has been discussed with the patient.  Alternative therapies have been discussed with the patient.  Operative risks include bleeding,  Infection,  Organ injury,  Nerve injury,  Blood vessel injury,  DVT,  Pulmonary embolism,  Death,  And possible reoperation.  Medical management risks  include worsening of present situation.  The success of the procedure is 50 -90 % at treating patients symptoms.  The patient understands and agrees to proceed.    / LOS: 1 day     Bevin Mayall A. 01/24/2013  

## 2013-01-24 NOTE — Brief Op Note (Signed)
01/23/2013 - 01/24/2013  1:30 PM  PATIENT:  [name]  53 y.o. female  PRE-OPERATIVE DIAGNOSIS:  right groin abscess  POST-OPERATIVE DIAGNOSIS:  right groin abcess  PROCEDURE:  Procedure(s): INCISION AND DRAINAGE ABSCESS (N/A)  SURGEON:  Surgeon(s) and Role:    * Khanh Tanori A. Gionni Vaca, MD - Primary   ASSISTANTS: none   ANESTHESIA:   general  EBL:  Total I/O In: 400 [I.V.:400] Out: -   BLOOD ADMINISTERED:none  DRAINS: none   LOCAL MEDICATIONS USED:  NONE  SPECIMEN:  No Specimen  DISPOSITION OF SPECIMEN:  N/A  COUNTS:  YES  TOURNIQUET:  * No tourniquets in log *  DICTATION: .Other Dictation: Dictation Number [id number]  PLAN OF CARE: Admit to inpatient   PATIENT DISPOSITION:  PACU - hemodynamically stable.   Delay start of Pharmacological VTE agent (>24hrs) due to surgical blood loss or risk of bleeding: no

## 2013-01-24 NOTE — Anesthesia Preprocedure Evaluation (Signed)
Anesthesia Evaluation  Patient identified by MRN, date of birth, ID band Patient awake    Reviewed: Allergy & Precautions, H&P , NPO status   Airway Mallampati: I TM Distance: >3 FB Neck ROM: Full    Dental   Pulmonary          Cardiovascular hypertension, Pt. on medications     Neuro/Psych    GI/Hepatic   Endo/Other  diabetes, Poorly Controlled, Type 2, Insulin Dependent and Oral Hypoglycemic AgentsHypothyroidism   Renal/GU      Musculoskeletal   Abdominal   Peds  Hematology   Anesthesia Other Findings   Reproductive/Obstetrics                           Anesthesia Physical Anesthesia Plan  ASA: III  Anesthesia Plan: General   Post-op Pain Management:    Induction: Intravenous  Airway Management Planned: Oral ETT  Additional Equipment:   Intra-op Plan:   Post-operative Plan: Extubation in OR  Informed Consent: I have reviewed the patients History and Physical, chart, labs and discussed the procedure including the risks, benefits and alternatives for the proposed anesthesia with the patient or authorized representative who has indicated his/her understanding and acceptance.     Plan Discussed with: CRNA and Surgeon  Anesthesia Plan Comments:         Anesthesia Quick Evaluation

## 2013-01-24 NOTE — Preoperative (Signed)
Beta Blockers   Reason not to administer Beta Blockers:Not Applicable 

## 2013-01-24 NOTE — Transfer of Care (Signed)
Immediate Anesthesia Transfer of Care Note  Patient: [name]  Procedure(s) Performed: Procedure(s): INCISION AND DRAINAGE ABSCESS (N/A)  Patient Location: PACU  Anesthesia Type:General  Level of Consciousness: awake, alert  and oriented  Airway & Oxygen Therapy: Patient Spontanous Breathing and Patient connected to face mask oxygen  Post-op Assessment: Report given to PACU RN  Post vital signs: Reviewed and stable  Complications: No apparent anesthesia complications

## 2013-01-24 NOTE — H&P (View-Only) (Signed)
Subjective: Pt sleepy.  No complaints.  Objective: Vital signs in last 24 hours: Temp:  [97.5 F (36.4 C)-99 F (37.2 C)] 99 F (37.2 C) (06/06 0546) Pulse Rate:  [77-95] 84 (06/06 0546) Resp:  [16-18] 16 (06/06 0546) BP: (132-164)/(64-83) 152/64 mmHg (06/06 0546) SpO2:  [86 %-95 %] 92 % (06/06 0546) Weight:  [231 lb 7.7 oz (105 kg)] 231 lb 7.7 oz (105 kg) (06/05 2359) Last BM Date:  (pta)  Intake/Output from previous day: 06/05 0701 - 06/06 0700 In: 570 [I.V.:570] Out: -  Intake/Output this shift:    Incision/Wound:roght groin open draining wound.  Foul smelling.  Lab Results:   Recent Labs  01/23/13 2258 01/24/13 0555  WBC 12.4* 10.5  HGB 11.9* 11.2*  HCT 33.8* 32.5*  PLT 334 308   BMET  Recent Labs  01/23/13 2258 01/24/13 0555  NA 131* 129*  K 3.5 3.7  CL 90* 90*  CO2 27 24  GLUCOSE 316* 355*  BUN 34* 37*  CREATININE 0.93 1.21*  CALCIUM 10.0 9.3   PT/INR  Recent Labs  01/23/13 2258  LABPROT 14.8  INR 1.18   ABG No results found for this basename: PHART, PCO2, PO2, HCO3,  in the last 72 hours  Studies/Results: Ct Pelvis W Contrast  01/23/2013   *RADIOLOGY REPORT*  Clinical Data:  Right groin boil lanced 5 days ago with persistent pain and drainage from the area  CT PELVIS WITH CONTRAST  Technique:  Multidetector CT imaging of the pelvis was performed using the standard protocol following the bolus administration of intravenous contrast.  Contrast: OMNIPAQUE IOHEXOL 300 MG/ML  SOLN  Comparison:   None.  Findings:  Visualized bowel is unremarkable.  Uterus and ovaries are normal.  Bladder is normal.  No intrapelvic lymphadenopathy or free fluid.  Prominent right groin lymph nodes are noted measuring 1.4 cm in maximal short axis diameter image 39.  There is fusiform enlargement of the gracilis muscle and adductor longus muscle on the right, with overlying subcutaneous soft tissue edema.  Areas of hypo enhancement are noted within these muscles  with mild peripheral enhancement which may indicate abscess formation, measuring 1.7 cm within the abductor longus muscle and 1.6 cm within the gracilis muscle.  There is minimal fluid overlying the gracilis muscle subjacent to the dermis but this itself is likely too small for percutaneous drainage, for example image 55 measuring 8 mm.  No acute osseous abnormality.  IMPRESSION: Fusiform enlargement of the right adductor longus and gracilis muscles with overlying skin edema and fluid most compatible with cellulitis and myositis with internal areas of probable abscess formation.  No subcutaneous or dermal drainable fluid collection.   Original Report Authenticated By: Christiana Pellant, M.D.    Anti-infectives: Anti-infectives   Start     Dose/Rate Route Frequency Ordered Stop   01/25/13 0000  ertapenem (INVANZ) 1 g in sodium chloride 0.9 % 50 mL IVPB     1 g 100 mL/hr over 30 Minutes Intravenous Every 24 hours 01/23/13 2307     01/24/13 1000  vancomycin (VANCOCIN) IVPB 1000 mg/200 mL premix     1,000 mg 200 mL/hr over 60 Minutes Intravenous Every 12 hours 01/23/13 2309     01/23/13 2315  vancomycin (VANCOCIN) IVPB 1000 mg/200 mL premix     1,000 mg 200 mL/hr over 60 Minutes Intravenous  Once 01/23/13 2309 01/24/13 0314   01/23/13 2300  ertapenem (INVANZ) 1 g in sodium chloride 0.9 % 50 mL IVPB  1 g 100 mL/hr over 30 Minutes Intravenous  Once 01/23/13 2252 01/24/13 0053   01/23/13 2200  vancomycin (VANCOCIN) IVPB 1000 mg/200 mL premix     1,000 mg 200 mL/hr over 60 Minutes Intravenous  Once 01/23/13 2146 01/23/13 2311      Assessment/Plan: right groin abscess OR for debridement today.  D/W pt.  The procedure has been discussed with the patient.  Alternative therapies have been discussed with the patient.  Operative risks include bleeding,  Infection,  Organ injury,  Nerve injury,  Blood vessel injury,  DVT,  Pulmonary embolism,  Death,  And possible reoperation.  Medical management risks  include worsening of present situation.  The success of the procedure is 50 -90 % at treating patients symptoms.  The patient understands and agrees to proceed.    / LOS: 1 day     Karissa Meenan A. 01/24/2013

## 2013-01-24 NOTE — Progress Notes (Signed)
Inpatient Diabetes Program Recommendations  AACE/ADA: New Consensus Statement on Inpatient Glycemic Control (2013)  Target Ranges:  Prepandial:   less than 140 mg/dL      Peak postprandial:   less than 180 mg/dL (1-2 hours)      Critically ill patients:  140 - 180 mg/dL   Hyperglycemia out of control  Inpatient Diabetes Program Recommendations Insulin - Basal: Please add 30 units of Levemir bid to pt's regimen. Pt takes 60 units bid at home. Correction (SSI): Pt is NPO. Please change correction schedlule to q 4hrs while NPO  Thank you, Lenor Coffin, RN, CNS, Diabetes Coordinator [phone])

## 2013-01-25 ENCOUNTER — Inpatient Hospital Stay (HOSPITAL_COMMUNITY): Payer: Medicaid Other

## 2013-01-25 LAB — URINALYSIS, ROUTINE W REFLEX MICROSCOPIC
Bilirubin Urine: NEGATIVE
Nitrite: NEGATIVE
Protein, ur: >300 mg/dL — AB
Specific Gravity, Urine: 1.019 (ref 1.005–1.030)
Urobilinogen, UA: 0.2 mg/dL (ref 0.0–1.0)

## 2013-01-25 LAB — CBC
Platelets: 299 10*3/uL (ref 150–400)
RBC: 3.71 MIL/uL — ABNORMAL LOW (ref 3.87–5.11)
WBC: 10.7 10*3/uL — ABNORMAL HIGH (ref 4.0–10.5)

## 2013-01-25 LAB — BASIC METABOLIC PANEL
CO2: 26 mEq/L (ref 19–32)
Calcium: 9 mg/dL (ref 8.4–10.5)
Chloride: 90 mEq/L — ABNORMAL LOW (ref 96–112)
GFR calc Af Amer: 16 mL/min — ABNORMAL LOW (ref >90)
Sodium: 129 mEq/L — ABNORMAL LOW (ref 135–145)

## 2013-01-25 LAB — GLUCOSE, CAPILLARY
Glucose-Capillary: 329 mg/dL — ABNORMAL HIGH (ref 70–99)
Glucose-Capillary: 481 mg/dL — ABNORMAL HIGH (ref 70–99)
Glucose-Capillary: 342 mg/dL — ABNORMAL HIGH (ref 70–99)

## 2013-01-25 LAB — URINE MICROSCOPIC-ADD ON

## 2013-01-25 MED ORDER — SODIUM CHLORIDE 0.9 % IV BOLUS (SEPSIS)
500.0000 mL | Freq: Once | INTRAVENOUS | Status: AC
  Administered 2013-01-25: 500 mL via INTRAVENOUS

## 2013-01-25 MED ORDER — INSULIN ASPART 100 UNIT/ML ~~LOC~~ SOLN
7.0000 [IU] | Freq: Once | SUBCUTANEOUS | Status: AC
  Administered 2013-01-25: 7 [IU] via SUBCUTANEOUS

## 2013-01-25 MED ORDER — INSULIN ASPART 100 UNIT/ML ~~LOC~~ SOLN
20.0000 [IU] | Freq: Once | SUBCUTANEOUS | Status: AC
  Administered 2013-01-25: 20 [IU] via SUBCUTANEOUS

## 2013-01-25 MED ORDER — HEPARIN SODIUM (PORCINE) 5000 UNIT/ML IJ SOLN
5000.0000 [IU] | Freq: Three times a day (TID) | INTRAMUSCULAR | Status: DC
  Administered 2013-01-25 – 2013-02-07 (×37): 5000 [IU] via SUBCUTANEOUS
  Filled 2013-01-25 (×43): qty 1

## 2013-01-25 MED ORDER — MORPHINE SULFATE 2 MG/ML IJ SOLN
1.0000 mg | INTRAMUSCULAR | Status: AC | PRN
  Administered 2013-01-25 (×2): 2 mg via INTRAVENOUS
  Filled 2013-01-25: qty 1

## 2013-01-25 MED ORDER — OXYCODONE-ACETAMINOPHEN 5-325 MG PO TABS
1.0000 | ORAL_TABLET | ORAL | Status: DC | PRN
  Administered 2013-01-26 – 2013-01-30 (×8): 2 via ORAL
  Administered 2013-01-31: 1 via ORAL
  Administered 2013-01-31 – 2013-02-03 (×8): 2 via ORAL
  Administered 2013-02-03 – 2013-02-07 (×4): 1 via ORAL
  Filled 2013-01-25 (×2): qty 2
  Filled 2013-01-25: qty 1
  Filled 2013-01-25: qty 2
  Filled 2013-01-25: qty 1
  Filled 2013-01-25: qty 2
  Filled 2013-01-25: qty 1
  Filled 2013-01-25: qty 2
  Filled 2013-01-25: qty 1
  Filled 2013-01-25 (×7): qty 2
  Filled 2013-01-25: qty 1
  Filled 2013-01-25 (×4): qty 2

## 2013-01-25 MED ORDER — INSULIN DETEMIR 100 UNIT/ML ~~LOC~~ SOLN
45.0000 [IU] | Freq: Two times a day (BID) | SUBCUTANEOUS | Status: DC
  Administered 2013-01-25: 45 [IU] via SUBCUTANEOUS
  Filled 2013-01-25 (×2): qty 0.45

## 2013-01-25 MED ORDER — SODIUM CHLORIDE 0.9 % IV SOLN
INTRAVENOUS | Status: DC
  Administered 2013-01-25 – 2013-02-06 (×15): via INTRAVENOUS

## 2013-01-25 MED ORDER — INSULIN DETEMIR 100 UNIT/ML ~~LOC~~ SOLN
60.0000 [IU] | Freq: Two times a day (BID) | SUBCUTANEOUS | Status: DC
  Administered 2013-01-25 – 2013-01-26 (×2): 60 [IU] via SUBCUTANEOUS
  Filled 2013-01-25 (×3): qty 0.6

## 2013-01-25 MED ORDER — SODIUM CHLORIDE 0.9 % IV SOLN
500.0000 mg | INTRAVENOUS | Status: DC
  Administered 2013-01-26 – 2013-01-28 (×4): 0.5 g via INTRAVENOUS
  Filled 2013-01-25 (×7): qty 0.5

## 2013-01-25 MED ORDER — MORPHINE SULFATE 2 MG/ML IJ SOLN
INTRAMUSCULAR | Status: AC
  Filled 2013-01-25: qty 1

## 2013-01-25 NOTE — Progress Notes (Signed)
PT Cancellation Note  Patient Details Name: [name] MRN: [id number] DOB: 03-Jun-1961   Cancelled Treatment:    Reason Eval/Treat Not Completed: Pain limiting ability to participate;Fatigue/lethargy limiting ability to participate;Patient at procedure or test/unavailable. Attempted x2. At first session, pt at procedure. During second attempt pt was very lethargic but was able to communicate and refuse. Will re-attempt at a later time.    Donnamarie Poag [place], [place] [phone] 01/25/2013, 2:42 PM

## 2013-01-25 NOTE — Progress Notes (Signed)
ANTIBIOTIC CONSULT NOTE -  Follow up  Pharmacy Consult for Ertapenem/Vancomycin  Indication: groin infection  Allergies  Allergen Reactions  . Gadolinium      Code: VOM, Desc: Pt began vomiting immed post infusion of multihance, Onset Date: 16109604   . Ivp Dye (Iodinated Diagnostic Agents) Nausea And Vomiting  . Metformin Other (See Comments)     diarrhea  . Penicillins Hives    Patient Measurements: Height: 5\' 5"  (165.1 cm) Weight: 231 lb 7.7 oz (105 kg) IBW/kg (Calculated) : 57  Vital Signs: Temp: 98 F (36.7 C) (06/07 1452) Temp src: Oral (06/07 1452) BP: 123/58 mmHg (06/07 1452) Pulse Rate: 63 (06/07 1452)  Labs:  Recent Labs  01/23/13 2258 01/24/13 0555 01/25/13 0455  WBC 12.4* 10.5 10.7*  HGB 11.9* 11.2* 10.4*  PLT 334 308 299  CREATININE 0.93 1.21* 3.48*   Estimated Creatinine Clearance: 23 ml/min (by C-G formula based on Cr of 3.48).   Microbiology: Recent Results (from the past 720 hour(s))  CULTURE, ROUTINE-ABSCESS     Status: None   Collection Time    01/18/13  5:48 AM      Result Value Range Status   Specimen Description ABSCESS   Final   Special Requests GROIN RIGHT   Final   Gram Stain     Final   Value: NO WBC SEEN     NO SQUAMOUS EPITHELIAL CELLS SEEN     MODERATE GRAM POSITIVE COCCI     IN PAIRS IN CHAINS   Culture     Final   Value: MODERATE GROUP B STREP(S.AGALACTIAE)ISOLATED     Note: TESTING AGAINST S. AGALACTIAE NOT ROUTINELY PERFORMED DUE TO PREDICTABILITY OF AMP/PEN/VAN SUSCEPTIBILITY.   Report Status 01/21/2013 FINAL   Final  CULTURE, BLOOD (ROUTINE X 2)     Status: None   Collection Time    01/18/13  6:45 AM      Result Value Range Status   Specimen Description BLOOD LEFT ARM   Final   Special Requests BOTTLES DRAWN AEROBIC AND ANAEROBIC 10CC   Final   Culture  Setup Time 01/18/2013 11:19   Final   Culture NO GROWTH 5 DAYS   Final   Report Status 01/24/2013 FINAL   Final  CULTURE, BLOOD (ROUTINE X 2)     Status: None   Collection Time    01/18/13  6:52 AM      Result Value Range Status   Specimen Description BLOOD LEFT HAND   Final   Special Requests BOTTLES DRAWN AEROBIC AND ANAEROBIC 10CC   Final   Culture  Setup Time 01/18/2013 11:19   Final   Culture NO GROWTH 5 DAYS   Final   Report Status 01/24/2013 FINAL   Final  SURGICAL PCR SCREEN     Status: Abnormal   Collection Time    01/24/13  5:54 AM      Result Value Range Status   MRSA, PCR NEGATIVE  NEGATIVE Final   Staphylococcus aureus POSITIVE (*) NEGATIVE Final   Comment:            The Xpert SA Assay (FDA     approved for NASAL specimens     in patients over 68 years of age),     is one component of     a comprehensive surveillance     program.  Test performance has     been validated by [employer] for patients greater     than or  equal to 71 year old.     It is not intended     to diagnose infection nor to     guide or monitor treatment.    Medical History: Past Medical History  Diagnosis Date  . Diabetic neuropathy   . Cellulitis   . Thyroid disease   . Hyperlipidemia     not on meds  . Heart murmur   . Arthritis   . Neuromuscular disorder   . Hypertension     hx of;but doesn't require meds anymore;has been off of x 52yr  . Cough   . Pneumonia     hx of;>74yr ago   . Headache(784.0)     occasionally  . Concussion as a teenager    slight  . Peripheral neuropathy   . Joint pain   . Joint swelling   . Dry skin   . Diarrhea     pt takes Reglan tid  . Urinary frequency   . Urinary urgency   . History of kidney stones   . Nocturia   . Hypothyroidism     takes Synthroid daily  . Diabetes mellitus     Novolog,Levemir,and Lovaza daily  . Depression     takes CYmbalta daily  . Peripheral edema     takes Lasix daily  . MRSA (methicillin resistant staph aureus) culture positive     hx of 2012  . Staph infection 2007    Assessment: Scr has increased to 3.48 in this 52 y.o female currently on IV vancomycin and  ertapenem for right groin infection. Will hold vancomycin dose for now,  vancomycin trough tonight and adjust vancomycin dose.     Goal of Therapy:  Vancomycin trough level 10-15 mcg/ml   Plan:  Decrease ertapenem to 500 mg IV q24h. DC current vancomycin 1gm q12h order and check vancomycin trough tonight.  Adjust vancomycin dose to keep trough = 10-15 mcg/ml. Monitor CBC, Cx, levels prn.  Noah Delaine, RPh Clinical Pharmacist Pager: [phone] 01/25/2013,7:18 PM

## 2013-01-25 NOTE — Progress Notes (Signed)
1 Day Post-Op  Subjective: Pt c/o a lot of pain.  Tolerating food, urinating through catheter (just placed).  Just returned from renal ultrasound.  Right BKA.    Objective: Vital signs in last 24 hours: Temp:  [98.1 F (36.7 C)-99.9 F (37.7 C)] 98.7 F (37.1 C) (06/07 0637) Pulse Rate:  [73-88] 73 (06/07 0637) Resp:  [16-20] 18 (06/07 0637) BP: (114-159)/(52-74) 116/64 mmHg (06/07 0637) SpO2:  [92 %-97 %] 97 % (06/07 0637) Last BM Date:  (pt states was PTA but is uncertain the exact date.)  Intake/Output from previous day: 06/06 0701 - 06/07 0700 In: 940.3 [P.O.:90; I.V.:650.3; IV Piggyback:200] Out: 20 [Blood:20] Intake/Output this shift:    PE: Gen:  Alert, NAD, pleasant Right Groin:  Wound bed appears clean, minimal slough, very tender to palpation, did not tolerate wound packing change very well.   Lab Results:   Recent Labs  01/24/13 0555 01/25/13 0455  WBC 10.5 10.7*  HGB 11.2* 10.4*  HCT 32.5* 30.8*  PLT 308 299   BMET  Recent Labs  01/24/13 0555 01/25/13 0455  NA 129* 129*  K 3.7 4.2  CL 90* 90*  CO2 24 26  GLUCOSE 355* 346*  BUN 37* 43*  CREATININE 1.21* 3.48*  CALCIUM 9.3 9.0   PT/INR  Recent Labs  01/23/13 2258  LABPROT 14.8  INR 1.18   CMP     Component Value Date/Time   NA 129* 01/25/2013 0455   K 4.2 01/25/2013 0455   CL 90* 01/25/2013 0455   CO2 26 01/25/2013 0455   GLUCOSE 346* 01/25/2013 0455   BUN 43* 01/25/2013 0455   CREATININE 3.48* 01/25/2013 0455   CALCIUM 9.0 01/25/2013 0455   PROT 6.8 01/24/2013 0555   ALBUMIN 1.6* 01/24/2013 0555   AST 11 01/24/2013 0555   ALT 11 01/24/2013 0555   ALKPHOS 155* 01/24/2013 0555   BILITOT 0.1* 01/24/2013 0555   GFRNONAA 14* 01/25/2013 0455   GFRAA 16* 01/25/2013 0455   Lipase     Component Value Date/Time   LIPASE 38 12/05/2012 0317       Studies/Results: Ct Pelvis W Contrast  01/23/2013   *RADIOLOGY REPORT*  Clinical Data:  Right groin boil lanced 5 days ago with persistent pain and drainage from the  area  CT PELVIS WITH CONTRAST  Technique:  Multidetector CT imaging of the pelvis was performed using the standard protocol following the bolus administration of intravenous contrast.  Contrast: OMNIPAQUE IOHEXOL 300 MG/ML  SOLN  Comparison:   None.  Findings:  Visualized bowel is unremarkable.  Uterus and ovaries are normal.  Bladder is normal.  No intrapelvic lymphadenopathy or free fluid.  Prominent right groin lymph nodes are noted measuring 1.4 cm in maximal short axis diameter image 39.  There is fusiform enlargement of the gracilis muscle and adductor longus muscle on the right, with overlying subcutaneous soft tissue edema.  Areas of hypo enhancement are noted within these muscles with mild peripheral enhancement which may indicate abscess formation, measuring 1.7 cm within the abductor longus muscle and 1.6 cm within the gracilis muscle.  There is minimal fluid overlying the gracilis muscle subjacent to the dermis but this itself is likely too small for percutaneous drainage, for example image 55 measuring 8 mm.  No acute osseous abnormality.  IMPRESSION: Fusiform enlargement of the right adductor longus and gracilis muscles with overlying skin edema and fluid most compatible with cellulitis and myositis with internal areas of probable abscess formation.  No  subcutaneous or dermal drainable fluid collection.   Original Report Authenticated By: Christiana Pellant, M.D.    Anti-infectives: Anti-infectives   Start     Dose/Rate Route Frequency Ordered Stop   01/25/13 0000  ertapenem (INVANZ) 1 g in sodium chloride 0.9 % 50 mL IVPB     1 g 100 mL/hr over 30 Minutes Intravenous Every 24 hours 01/23/13 2307     01/24/13 1000  vancomycin (VANCOCIN) IVPB 1000 mg/200 mL premix     1,000 mg 200 mL/hr over 60 Minutes Intravenous Every 12 hours 01/23/13 2309     01/23/13 2315  vancomycin (VANCOCIN) IVPB 1000 mg/200 mL premix     1,000 mg 200 mL/hr over 60 Minutes Intravenous  Once 01/23/13 2309 01/24/13  0314   01/23/13 2300  ertapenem (INVANZ) 1 g in sodium chloride 0.9 % 50 mL IVPB     1 g 100 mL/hr over 30 Minutes Intravenous  Once 01/23/13 2252 01/24/13 0053   01/23/13 2200  vancomycin (VANCOCIN) IVPB 1000 mg/200 mL premix     1,000 mg 200 mL/hr over 60 Minutes Intravenous  Once 01/23/13 2146 01/23/13 2311       Assessment/Plan POD #1 s/p  I&D right groin abscess 1.  Continue abx, pain control 2.  Continue wet to dry dressing changes TID until clean then can reduce to BID 3.  Increased dosage of percocet to better control pain and gave a few doses of morphine for breakthrough pain 4.  Ambulate and IS 5.  SCD and heparin 6.  D/c from our perspective when pain controlled at dressing changes  Leukocytosis - improved ARF - foley Other chronic medical conditions - per primary service    LOS: 2 days    DORT, Jalessa Peyser 01/25/2013, 9:02 AM Pager: [phone]

## 2013-01-25 NOTE — Progress Notes (Signed)
I have seen and examined the patient and agree with the assessment and plans.  Lyndsy Gilberto A. Kentarius Partington  MD, FACS  

## 2013-01-25 NOTE — Progress Notes (Signed)
[facility] PROGRESS NOTE  [name] WUJ:[id number] DOB: 1961/06/29 DOA: 01/23/2013  PCP: [name], MD Endocrinologist: Dr. Sharl Ma  Brief HPI: Pt is 52 y.o. female with a history of diabetes mellitus, insulin dependent who present to [facility] ED with main concerns persistent right inguinal abscess which was drained 5 days prior to this admission in the emergency department, and continues to drain yellow pus, she was discharged on Clindamycin. She explained that it has not been getting much better. She described throbbing pain in the right inguinal area, intermittent and 5/10 in severity, radiating throughout groin area, no specific alleviating or aggravating factors. Pt denied fevers, chills, other systemic concerns. Surgery was consulted and patient was admitted to the hospital.   Past medical history:  Past Medical History  Diagnosis Date  . Diabetic neuropathy   . Cellulitis   . Thyroid disease   . Hyperlipidemia     not on meds  . Heart murmur   . Arthritis   . Neuromuscular disorder   . Hypertension     hx of;but doesn't require meds anymore;has been off of x 87yr  . Cough   . Pneumonia     hx of;>71yr ago   . Headache(784.0)     occasionally  . Concussion as a teenager    slight  . Peripheral neuropathy   . Joint pain   . Joint swelling   . Dry skin   . Diarrhea     pt takes Reglan tid  . Urinary frequency   . Urinary urgency   . History of kidney stones   . Nocturia   . Hypothyroidism     takes Synthroid daily  . Diabetes mellitus     Novolog,Levemir,and Lovaza daily  . Depression     takes CYmbalta daily  . Peripheral edema     takes Lasix daily  . MRSA (methicillin resistant staph aureus) culture positive     hx of 2012  . Staph infection 2007    Consultants: Gen Surgery  Procedures: Surgical I&D 6/6.  Antibiotics: IV Vanc 6/5--> IV Ertapenem 6/5-->  Subjective: Patient sleepy but easily arousable. Complains of 5/10 pain in right groin.  Passing urine but does not remember when she passed last.  Objective: Vital Signs  Filed Vitals:   01/24/13 1945 01/24/13 2117 01/24/13 2212 01/25/13 0637  BP: 114/53 120/74  116/64  Pulse: 81 80  73  Temp: 99.9 F (37.7 C) 98.9 F (37.2 C)  98.7 F (37.1 C)  TempSrc: Oral Oral  Oral  Resp: 20 19  18   Height:      Weight:      SpO2: 95% 96% 95% 97%    Intake/Output Summary (Last 24 hours) at 01/25/13 9562 Last data filed at 01/24/13 2330  Gross per 24 hour  Intake 940.34 ml  Output     20 ml  Net 920.34 ml   Filed Weights   01/23/13 2300 01/23/13 2359  Weight: 105 kg (231 lb 7.7 oz) 105 kg (231 lb 7.7 oz)   General appearance: cooperative, no distress and moderately obese Head: Normocephalic, without obvious abnormality, atraumatic Throat: Dry Mucus membranes Resp: clear to auscultation bilaterally Cardio: regular rate and rhythm, S1, S2 normal. Systolic murmur in mitral area (old per patient). no click, rub or gallop GI: soft, non-tender; bowel sounds normal; no masses,  no organomegaly Extremities: Right groin area covered with dressing. Not fully examined today due to surgery yesterday. She is s/p BKA on right. Pulses:  2+ and symmetric Skin: erythema in righ groin area Lymph nodes: Inguinal adenopathy: palpable in right groin Neurologic: Somnolent but arousable. Oriented x 3. No focal deficits.  Lab Results:  Basic Metabolic Panel:  Recent Labs Lab 01/23/13 1903 01/23/13 2258 01/24/13 0555 01/25/13 0455  NA 133* 131* 129* 129*  K 4.0 3.5 3.7 4.2  CL 91* 90* 90* 90*  CO2 30 27 24 26   GLUCOSE 298* 316* 355* 346*  BUN 35* 34* 37* 43*  CREATININE 1.03 0.93 1.21* 3.48*  CALCIUM 9.8 10.0 9.3 9.0  MG  --  1.8  --   --   PHOS  --  4.2  --   --    Liver Function Tests:  Recent Labs Lab 01/23/13 2258 01/24/13 0555  AST 11 11  ALT 11 11  ALKPHOS 140* 155*  BILITOT 0.2* 0.1*  PROT 7.3 6.8  ALBUMIN 2.0* 1.6*   CBC:  Recent Labs Lab 01/23/13 1903  01/23/13 2258 01/24/13 0555 01/25/13 0455  WBC 11.0* 12.4* 10.5 10.7*  NEUTROABS 7.1 8.1*  --   --   HGB 11.3* 11.9* 11.2* 10.4*  HCT 33.4* 33.8* 32.5* 30.8*  MCV 82.7 81.6 82.1 83.0  PLT 309 334 308 299   CBG:  Recent Labs Lab 01/24/13 0633 01/24/13 1158 01/24/13 1613 01/24/13 2122 01/25/13 0630  GLUCAP 320* 206* 231* 346* 329*    Recent Results (from the past 240 hour(s))  CULTURE, ROUTINE-ABSCESS     Status: None   Collection Time    01/18/13  5:48 AM      Result Value Range Status   Specimen Description ABSCESS   Final   Special Requests GROIN RIGHT   Final   Gram Stain     Final   Value: NO WBC SEEN     NO SQUAMOUS EPITHELIAL CELLS SEEN     MODERATE GRAM POSITIVE COCCI     IN PAIRS IN CHAINS   Culture     Final   Value: MODERATE GROUP B STREP(S.AGALACTIAE)ISOLATED     Note: TESTING AGAINST S. AGALACTIAE NOT ROUTINELY PERFORMED DUE TO PREDICTABILITY OF AMP/PEN/VAN SUSCEPTIBILITY.   Report Status 01/21/2013 FINAL   Final  CULTURE, BLOOD (ROUTINE X 2)     Status: None   Collection Time    01/18/13  6:45 AM      Result Value Range Status   Specimen Description BLOOD LEFT ARM   Final   Special Requests BOTTLES DRAWN AEROBIC AND ANAEROBIC 10CC   Final   Culture  Setup Time 01/18/2013 11:19   Final   Culture NO GROWTH 5 DAYS   Final   Report Status 01/24/2013 FINAL   Final  CULTURE, BLOOD (ROUTINE X 2)     Status: None   Collection Time    01/18/13  6:52 AM      Result Value Range Status   Specimen Description BLOOD LEFT HAND   Final   Special Requests BOTTLES DRAWN AEROBIC AND ANAEROBIC 10CC   Final   Culture  Setup Time 01/18/2013 11:19   Final   Culture NO GROWTH 5 DAYS   Final   Report Status 01/24/2013 FINAL   Final  SURGICAL PCR SCREEN     Status: Abnormal   Collection Time    01/24/13  5:54 AM      Result Value Range Status   MRSA, PCR NEGATIVE  NEGATIVE Final   Staphylococcus aureus POSITIVE (*) NEGATIVE Final   Comment:  The Xpert SA  Assay (FDA     approved for NASAL specimens     in patients over 49 years of age),     is one component of     a comprehensive surveillance     program.  Test performance has     been validated by [employer] for patients greater     than or equal to 66 year old.     It is not intended     to diagnose infection nor to     guide or monitor treatment.      Studies/Results: Ct Pelvis W Contrast  01/23/2013   *RADIOLOGY REPORT*  Clinical Data:  Right groin boil lanced 5 days ago with persistent pain and drainage from the area  CT PELVIS WITH CONTRAST  Technique:  Multidetector CT imaging of the pelvis was performed using the standard protocol following the bolus administration of intravenous contrast.  Contrast: OMNIPAQUE IOHEXOL 300 MG/ML  SOLN  Comparison:   None.  Findings:  Visualized bowel is unremarkable.  Uterus and ovaries are normal.  Bladder is normal.  No intrapelvic lymphadenopathy or free fluid.  Prominent right groin lymph nodes are noted measuring 1.4 cm in maximal short axis diameter image 39.  There is fusiform enlargement of the gracilis muscle and adductor longus muscle on the right, with overlying subcutaneous soft tissue edema.  Areas of hypo enhancement are noted within these muscles with mild peripheral enhancement which may indicate abscess formation, measuring 1.7 cm within the abductor longus muscle and 1.6 cm within the gracilis muscle.  There is minimal fluid overlying the gracilis muscle subjacent to the dermis but this itself is likely too small for percutaneous drainage, for example image 55 measuring 8 mm.  No acute osseous abnormality.  IMPRESSION: Fusiform enlargement of the right adductor longus and gracilis muscles with overlying skin edema and fluid most compatible with cellulitis and myositis with internal areas of probable abscess formation.  No subcutaneous or dermal drainable fluid collection.   Original Report Authenticated By: Christiana Pellant, M.D.     Medications:  Scheduled: . atorvastatin  80 mg Oral q1800  . ertapenem  1 g Intravenous Q24H  . FLUoxetine  20 mg Oral Daily  . gabapentin  300 mg Oral TID  . gemfibrozil  600 mg Oral BID AC  . heparin subcutaneous  5,000 Units Subcutaneous Q8H  . insulin aspart  0-15 Units Subcutaneous TID WC  . insulin aspart  0-5 Units Subcutaneous QHS  . insulin detemir  45 Units Subcutaneous BID  . levothyroxine  25 mcg Oral QAC breakfast  . metoCLOPramide  5 mg Oral TID WC  . oxybutynin  5 mg Oral Daily  . pantoprazole  40 mg Oral Daily  . vancomycin  1,000 mg Intravenous Q12H   Continuous: . sodium chloride    . lactated ringers 20 mL/hr at 01/24/13 2330   ZOX:[id number], acetaminophen, hydrALAZINE, HYDROmorphone (DILAUDID) injection, meperidine (DEMEROL) injection, ondansetron (ZOFRAN) IV, ondansetron, oxyCODONE-acetaminophen  Assessment/Plan:  Principal Problem:   Abscess of groin, right Active Problems:   Diabetes mellitus type 2, uncontrolled, with complications   DEPRESSION   PERIPHERAL NEUROPATHY   HYPERCHOLESTEROLEMIA   Hyponatremia   HTN (hypertension)   Leukocytosis   Anemia of chronic disease   Hypothyroidism   ARF (acute renal failure)    Abscess of groin, right  Status post I&D 6/6. Continue Vanc and Ertapenem per pharmacy. Blood cultures from 5/31 shows no growth. Strep  noted from abscess. May need ID input regarding antibiotics considering deep infection. Will discuss over phone when other issues have stabilized.  ARF Creatinine has risen significantly. Per Rn patient has not received any IVF since returning from OR yesterday. Etiology is likely multifactorial including hypovolemia, infection, antibiotics. Will give bolus and start NS at 121ml/hr. Place foley to monitor UO. US renal. Check UA. Monitor closely. If she does not improve in next 24 hrs will need to involve nephrology.  Uncontrolled DIABETES MELLITUS, TYPE II  Poorly controlled. Continue  Levemir for now. But will decrease dose due to renal failure. HBA1C is 12.9.   DEPRESSION  Appears stable at this time, normal affect. Continue Fluoxetine.  PERIPHERAL NEUROPATHY  Complication of diabetes, continue Neurontin. But will decrease dose to avoid excessive sedation.   Hyponatremia  Most likely due to hypovolemia. Continue IV fluids. Stopped diuretics.  HTN (hypertension)  Continue to monitor closely.   Anemia of chronic disease  Hg and Hct stable and at pt's baseline.   Code Status:  Full Code DVT Prophylaxis:   Heparin Family Communication: Discussed with patient  Disposition Plan: Not ready for discharge. Start mobilizing.    LOS: 2 days   [facility]  [facility] Pager [phone] 01/25/2013, 8:22 AM  If 8PM-8AM, please contact night-coverage at [email or web address], password [facility]

## 2013-01-26 ENCOUNTER — Encounter (HOSPITAL_COMMUNITY): Payer: Self-pay | Admitting: Nephrology

## 2013-01-26 DIAGNOSIS — R809 Proteinuria, unspecified: Secondary | ICD-10-CM

## 2013-01-26 DIAGNOSIS — G609 Hereditary and idiopathic neuropathy, unspecified: Secondary | ICD-10-CM

## 2013-01-26 DIAGNOSIS — IMO0001 Reserved for inherently not codable concepts without codable children: Secondary | ICD-10-CM

## 2013-01-26 LAB — PROTEIN / CREATININE RATIO, URINE
Creatinine, Urine: 108.31 mg/dL
Protein Creatinine Ratio: 1.35 — ABNORMAL HIGH (ref 0.00–0.15)

## 2013-01-26 LAB — BASIC METABOLIC PANEL
BUN: 57 mg/dL — ABNORMAL HIGH (ref 6–23)
CO2: 23 mEq/L (ref 19–32)
Calcium: 8.9 mg/dL (ref 8.4–10.5)
Chloride: 96 mEq/L (ref 96–112)
Creatinine, Ser: 4.74 mg/dL — ABNORMAL HIGH (ref 0.50–1.10)
Glucose, Bld: 252 mg/dL — ABNORMAL HIGH (ref 70–99)

## 2013-01-26 LAB — URINALYSIS, ROUTINE W REFLEX MICROSCOPIC
Ketones, ur: NEGATIVE mg/dL
Leukocytes, UA: NEGATIVE
Nitrite: NEGATIVE
Protein, ur: 100 mg/dL — AB
pH: 5 (ref 5.0–8.0)

## 2013-01-26 LAB — URINE MICROSCOPIC-ADD ON

## 2013-01-26 LAB — CBC
HCT: 29.2 % — ABNORMAL LOW (ref 36.0–46.0)
MCH: 27.8 pg (ref 26.0–34.0)
MCV: 83 fL (ref 78.0–100.0)
RDW: 14.4 % (ref 11.5–15.5)
WBC: 8.8 10*3/uL (ref 4.0–10.5)

## 2013-01-26 LAB — GLUCOSE, CAPILLARY
Glucose-Capillary: 280 mg/dL — ABNORMAL HIGH (ref 70–99)
Glucose-Capillary: 335 mg/dL — ABNORMAL HIGH (ref 70–99)

## 2013-01-26 MED ORDER — DOXYCYCLINE HYCLATE 100 MG PO TABS
100.0000 mg | ORAL_TABLET | Freq: Two times a day (BID) | ORAL | Status: DC
  Administered 2013-01-26 – 2013-02-03 (×16): 100 mg via ORAL
  Filled 2013-01-26 (×19): qty 1

## 2013-01-26 MED ORDER — INSULIN ASPART 100 UNIT/ML ~~LOC~~ SOLN
5.0000 [IU] | Freq: Once | SUBCUTANEOUS | Status: AC
  Administered 2013-01-26: 5 [IU] via SUBCUTANEOUS

## 2013-01-26 MED ORDER — INSULIN DETEMIR 100 UNIT/ML ~~LOC~~ SOLN
65.0000 [IU] | Freq: Two times a day (BID) | SUBCUTANEOUS | Status: DC
  Administered 2013-01-26 – 2013-01-27 (×2): 65 [IU] via SUBCUTANEOUS
  Filled 2013-01-26 (×3): qty 0.65

## 2013-01-26 MED ORDER — GABAPENTIN 300 MG PO CAPS
300.0000 mg | ORAL_CAPSULE | Freq: Every day | ORAL | Status: DC
  Administered 2013-01-27 – 2013-01-30 (×3): 300 mg via ORAL
  Filled 2013-01-26 (×4): qty 1

## 2013-01-26 NOTE — Consult Note (Signed)
[name] 01/26/2013 [name] Requesting Physician:  Dr Gwenlyn Perking  Reason for Consult:  Acute renal failure HPI: The patient is a 53 y.o. year-old with hx of DM2, HL, HTN (off meds), cellulitis and depression presented on 6/5 with draining abcess of R groin. She had abcess drained 5d PTA and had been started on po abx but the area continued to drain.  Creatinine was normal at 0.93 on admission but is now up to 4.74 today.  Meds since admission have been >> Lipitor, Invanz IV, prozac, neurontin, gemfibrozil, SQ hep, dilaudid IV, insulin, synthroid, reglan, PPI, ditropan and IV vancomycin (1 gm on 6/5, 3gm on 6/6 and 1 gm on 6/7)  She had a contrasted CT scan of the pelvis on 6/5 with of dye  BP's have been normal to slightly high, no hypotension. I/O since admit are +1.3L.  Weight has not been checked since admit  UA >> 1.019, 5.0, >300 prot, mod Hb, 7-10rbc, gran casts. Prior UA's in April and may also showed proteinuria.  Renal US 6/7 was normal  ROS  no cp, cough, n/v/[name]  no fever  no abd pain  + R groin pain  no sob  no skin rash  no hx renal dz   Past Medical History:  Past Medical History  Diagnosis Date  . Diabetic neuropathy   . Cellulitis   . Thyroid disease   . Hyperlipidemia     not on meds  . Heart murmur   . Arthritis   . Neuromuscular disorder   . Hypertension     hx of;but doesn't require meds anymore;has been off of x 32yr  . Cough   . Pneumonia     hx of;>77yr ago   . Headache(784.0)     occasionally  . Concussion as a teenager    slight  . Peripheral neuropathy   . Joint pain   . Joint swelling   . Dry skin   . Diarrhea     pt takes Reglan tid  . Urinary frequency   . Urinary urgency   . History of kidney stones   . Nocturia   . Hypothyroidism     takes Synthroid daily  . Diabetes mellitus     Novolog,Levemir,and Lovaza daily  . Depression     takes CYmbalta daily  . Peripheral edema     takes Lasix daily  . MRSA  (methicillin resistant staph aureus) culture positive     hx of 2012  . Staph infection 2007    Past Surgical History:  Past Surgical History  Procedure Laterality Date  . Amputation    . Leg amputation      right leg amputation below knee  . Toe amputation      left foot only 2 toes remaning.   . Tubal ligation    . Foot surgery      right foot  . Wrist surgery      right tendon  . Dilation and curettage of uterus    . Refractive surgery      left    Family History:  Family History  Problem Relation Age of Onset  . Hypertension Mother   . Hyperlipidemia Mother   . Hypertension Brother   . Hyperlipidemia Brother   . Cancer Maternal Aunt   . Diabetes Paternal Aunt   . Diabetes Paternal Uncle   . Diabetes Paternal Grandmother   . Anesthesia problems Neg Hx   . Hypotension Neg Hx   .  Malignant hyperthermia Neg Hx   . Pseudochol deficiency Neg Hx    Social History:  reports that she has been smoking.  She has never used smokeless tobacco. She reports that she does not drink alcohol or use illicit drugs.  Allergies:  Allergies  Allergen Reactions  . Gadolinium      Code: VOM, Desc: Pt began vomiting immed post infusion of multihance, Onset Date: [id number]   . Ivp Dye (Iodinated Diagnostic Agents) Nausea And Vomiting  . Metformin Other (See Comments)     diarrhea  . Penicillins Hives    Home medications: Prior to Admission medications   Medication Sig Start Date End Date Taking? Authorizing Provider  atorvastatin (LIPITOR) 80 MG tablet Take 80 mg by mouth daily.   Yes Historical Provider, MD  clindamycin (CLEOCIN) 150 MG capsule Take 300 mg by mouth every 6 (six) hours. 7 day course (#56) started 01/18/13   Yes Historical Provider, MD  FLUoxetine (PROZAC) 20 MG tablet Take 20 mg by mouth daily.   Yes Historical Provider, MD  gabapentin (NEURONTIN) 800 MG tablet Take 800 mg by mouth 3 (three) times daily.    Yes Historical Provider, MD  gemfibrozil (LOPID) 600 MG  tablet Take 600 mg by mouth 2 (two) times daily before a meal.   Yes Historical Provider, MD  hydrochlorothiazide (MICROZIDE) 12.5 MG capsule Take 12.5 mg by mouth daily.   Yes Historical Provider, MD  HYDROcodone-acetaminophen (NORCO) 10-325 MG per tablet Take 1 tablet by mouth 2 (two) times daily as needed. For pain   Yes Historical Provider, MD  insulin aspart (NOVOLOG) 100 UNIT/ML injection Inject 0-48 Units into the skin 3 (three) times daily with meals. Per sliding scale provided by endocrinologist   Yes Historical Provider, MD  insulin detemir (LEVEMIR) 100 UNIT/ML injection Inject 60 Units into the skin 2 (two) times daily.    Yes Historical Provider, MD  levothyroxine (SYNTHROID, LEVOTHROID) 25 MCG tablet Take 25 mcg by mouth every morning.    Yes Historical Provider, MD  metoCLOPramide (REGLAN) 5 MG tablet Take 5 mg by mouth 3 (three) times daily.    Yes Historical Provider, MD  omeprazole (PRILOSEC) 20 MG capsule Take 20 mg by mouth daily.   Yes Historical Provider, MD  oxybutynin (DITROPAN) 5 MG tablet Take 5 mg by mouth every morning.     Yes Historical Provider, MD  oxyCODONE-acetaminophen (PERCOCET/ROXICET) 5-325 MG per tablet Take 1 tablet by mouth every 4 (four) hours as needed for pain.   Yes Historical Provider, MD  ondansetron (ZOFRAN ODT) 4 MG disintegrating tablet Take 1 tablet (4 mg total) by mouth every 8 (eight) hours as needed for nausea. 01/18/13   Olivia Mackie, MD    Labs: Liver Function Tests:  Recent Labs Lab 01/23/13 2258 01/24/13 0555  AST 11 11  ALT 11 11  ALKPHOS 140* 155*  BILITOT 0.2* 0.1*  PROT 7.3 6.8  ALBUMIN 2.0* 1.6*   CBC  Recent Labs Lab 01/23/13 1903 01/23/13 2258 01/24/13 0555 01/25/13 0455 01/26/13 0423  WBC 11.0* 12.4* 10.5 10.7* 8.8  NEUTROABS 7.1 8.1*  --   --   --   HGB 11.3* 11.9* 11.2* 10.4* 9.8*  HCT 33.4* 33.8* 32.5* 30.8* 29.2*  MCV 82.7 81.6 82.1 83.0 83.0  PLT 309 334 308 299 267   Basic Metabolic Panel:  Recent  Labs Lab 01/23/13 1903 01/23/13 2258 01/24/13 0555 01/25/13 0455 01/26/13 0423  NA 133* 131* 129* 129* 132*  K 4.0 3.5  3.7 4.2 4.5  CL 91* 90* 90* 90* 96  CO2 30 27 24 26 23   GLUCOSE 298* 316* 355* 346* 252*  BUN 35* 34* 37* 43* 57*  CREATININE 1.03 0.93 1.21* 3.48* 4.74*  CALCIUM 9.8 10.0 9.3 9.0 8.9  PHOS  --  4.2  --   --   --     Physical Exam:  Blood pressure 136/70, pulse 83, temperature 97.4 F (36.3 C), temperature source Oral, resp. rate 17, height 5\' 5"  (1.651 m), weight 105 kg (231 lb 7.7 oz), SpO2 92.00%. Gen: up in chair, pleasant in no distress Skin: no rash, cyanosis HEENT:  EOMI, sclera anicteric, throat clear Neck: no JVD, no LAN Chest: crackles R base CV: regular, no rub or gallop, no carotid bruits Abdomen: soft, obese, nontender, no ascites or organomegaly Ext: R BKA, no LE or stump edema, no joint effusion or deformity, no gangrene or ulceration Neuro: alert, Ox3, no focal deficit, no asterixis  UA >> 1.019, 5.0, >300 prot, mod Hb, 7-10rbc, gran casts. Prior UA's in April and may also showed proteinuria.  Renal US 6/7 was normal  Impression/Plan 1. Acute kidney injury- in patient with probable underlying diabetic nephropathy (proteinuria) who received IV contrast on 6/5 and IV Vanc 6/5 thru 6/7.  Doubt vanc is the cause, suspect contrast nephropathy.  Looks euvolemic and without uremic symptoms, making lots of urine. Will follow, avoid nephrotoxins, continue IVF's. Hopefully will recover soon.  Vancomycin has been stopped appropriately.  Check urine lytes and UPC ratio 2. Proteinuria- see above 3. R groin abcess- s/p I&[name], on IV ertapenem and po doxy now 4. DM2 x 20 yrs 5. PVD s/p R BKA 6. HTN- on HCTZ only at home 7. Depression 8. HL 9. Hypothyroidism   Vinson Moselle  MD [facility] [phone] pgr     [phone] cell 01/26/2013, 2:20 PM

## 2013-01-26 NOTE — Progress Notes (Signed)
S; Says she is having less pain at the I&D site. O: VS:BP 136/70  Pulse 83  Temp(Src) 97.4 F (36.3 C) (Oral)  Resp 17  Ht 5\' 5"  (1.651 m)  Wt 231 lb 7.7 oz (105 kg)  BMI 38.52 kg/m2  SpO2 92% Wound: Dressings being changed bu nursing, looks clean today.  IMP: improved PLAN: Continue local wound care

## 2013-01-26 NOTE — Progress Notes (Signed)
ANTIBIOTIC CONSULT NOTE -  Follow up  Pharmacy Consult for Ertapenem/Vancomycin  Indication: groin infection  Allergies  Allergen Reactions  . Gadolinium      Code: VOM, Desc: Pt began vomiting immed post infusion of multihance, Onset Date: [id number]   . Ivp Dye (Iodinated Diagnostic Agents) Nausea And Vomiting  . Metformin Other (See Comments)     diarrhea  . Penicillins Hives    Patient Measurements: Height: 5\' 5"  (165.1 cm) Weight: 231 lb 7.7 oz (105 kg) IBW/kg (Calculated) : 57  Vital Signs: Temp: 97.4 F (36.3 C) (06/08 0639) Temp src: Oral (06/08 0639) BP: 136/70 mmHg (06/08 0639) Pulse Rate: 83 (06/08 0639)  Labs:  Recent Labs  01/24/13 0555 01/25/13 0455 01/26/13 0423  WBC 10.5 10.7* 8.8  HGB 11.2* 10.4* 9.8*  PLT 308 299 267  CREATININE 1.21* 3.48* 4.74*   Estimated Creatinine Clearance: 16.9 ml/min (by C-G formula based on Cr of 4.74).   Microbiology: Recent Results (from the past 720 hour(s))  CULTURE, ROUTINE-ABSCESS     Status: None   Collection Time    01/18/13  5:48 AM      Result Value Range Status   Specimen Description ABSCESS   Final   Special Requests GROIN RIGHT   Final   Gram Stain     Final   Value: NO WBC SEEN     NO SQUAMOUS EPITHELIAL CELLS SEEN     MODERATE GRAM POSITIVE COCCI     IN PAIRS IN CHAINS   Culture     Final   Value: MODERATE GROUP B STREP(S.AGALACTIAE)ISOLATED     Note: TESTING AGAINST S. AGALACTIAE NOT ROUTINELY PERFORMED DUE TO PREDICTABILITY OF AMP/PEN/VAN SUSCEPTIBILITY.   Report Status 01/21/2013 FINAL   Final  CULTURE, BLOOD (ROUTINE X 2)     Status: None   Collection Time    01/18/13  6:45 AM      Result Value Range Status   Specimen Description BLOOD LEFT ARM   Final   Special Requests BOTTLES DRAWN AEROBIC AND ANAEROBIC 10CC   Final   Culture  Setup Time 01/18/2013 11:19   Final   Culture NO GROWTH 5 DAYS   Final   Report Status 01/24/2013 FINAL   Final  CULTURE, BLOOD (ROUTINE X 2)     Status: None    Collection Time    01/18/13  6:52 AM      Result Value Range Status   Specimen Description BLOOD LEFT HAND   Final   Special Requests BOTTLES DRAWN AEROBIC AND ANAEROBIC 10CC   Final   Culture  Setup Time 01/18/2013 11:19   Final   Culture NO GROWTH 5 DAYS   Final   Report Status 01/24/2013 FINAL   Final  SURGICAL PCR SCREEN     Status: Abnormal   Collection Time    01/24/13  5:54 AM      Result Value Range Status   MRSA, PCR NEGATIVE  NEGATIVE Final   Staphylococcus aureus POSITIVE (*) NEGATIVE Final   Comment:            The Xpert SA Assay (FDA     approved for NASAL specimens     in patients over 70 years of age),     is one component of     a comprehensive surveillance     program.  Test performance has     been validated by [employer] for patients greater     than  or equal to 82 year old.     It is not intended     to diagnose infection nor to     guide or monitor treatment.    Medical History: Past Medical History  Diagnosis Date  . Diabetic neuropathy   . Cellulitis   . Thyroid disease   . Hyperlipidemia     not on meds  . Heart murmur   . Arthritis   . Neuromuscular disorder   . Hypertension     hx of;but doesn't require meds anymore;has been off of x 51yr  . Cough   . Pneumonia     hx of;>87yr ago   . Headache(784.0)     occasionally  . Concussion as a teenager    slight  . Peripheral neuropathy   . Joint pain   . Joint swelling   . Dry skin   . Diarrhea     pt takes Reglan tid  . Urinary frequency   . Urinary urgency   . History of kidney stones   . Nocturia   . Hypothyroidism     takes Synthroid daily  . Diabetes mellitus     Novolog,Levemir,and Lovaza daily  . Depression     takes CYmbalta daily  . Peripheral edema     takes Lasix daily  . MRSA (methicillin resistant staph aureus) culture positive     hx of 2012  . Staph infection 2007    Assessment: Scr today = 4.74 < 3.48 < 1.21 <0.93 in this 52 y.o female.  Vancomycin was  held yesterday due to ARF and trough checked which was high at 39.1.  Now random vancomycin level is down to 28.1 mcg/ml, which is still above desired trough level 10-15 mcg/ml for treatment of rt groin infection.  Will continue to hold the vancomycin and recheck a level in AM.  Redose when level <15-20 mcg/ml.   Goal of Therapy:  Vancomycin trough level 10-15 mcg/ml   Plan:  Continue ertapenem to 500 mg IV q24h. Check vancomycin random in AM.   Adjust vancomycin dose to keep trough = 10-15 mcg/ml. Monitor CBC, Cx, levels prn.  Noah Delaine, RPh Clinical Pharmacist Pager: [phone] 01/26/2013,1:40 PM

## 2013-01-26 NOTE — Progress Notes (Signed)
As expected with dramatic rise in SCr, vanc trough results high at 39.1; will hold vanc for now and obtain random level for current kinetics.  Vernard Gambles, PharmD, BCPS 01/26/2013 12:27 AM

## 2013-01-26 NOTE — Evaluation (Signed)
Physical Therapy Evaluation Patient Details Name: [name] MRN: [id number] DOB: March 26, 1961 Today's Date: 01/26/2013 Time: 4098-1191 PT Time Calculation (min): 22 min  PT Assessment / Plan / Recommendation Clinical Impression  Pt is a 52 y.o. female adm due to abscess of R groin. Pt had BKA in 2011. presents with decreased indepdence with gt and mobility and is limited in therapy today due to pain and fatigue. Pt can be very impulsive and denies need for education at times. Refused to amb or perform SPT. Pt will benefit from skilled PT to maximize functional mobility. Recommend SNF to patient due to decreased mobility and home living situation; pt adamently refusing SNF.     PT Assessment  Patient needs continued PT services    Follow Up Recommendations  SNF;Other (comment) (pt is refusing at this time; will need 24/7 care if D/C home)    Does the patient have the potential to tolerate intense rehabilitation      Barriers to Discharge Decreased caregiver support pt lives alone    Equipment Recommendations  None recommended by PT    Recommendations for Other Services OT consult   Frequency Min 4X/week    Precautions / Restrictions Precautions Precautions: Fall   Pertinent Vitals/Pain 8/10; pt premedicated.       Mobility  Bed Mobility Bed Mobility: Supine to Sit;Sitting - Scoot to Edge of Bed Supine to Sit: 5: Supervision;HOB elevated;With rails Sitting - Scoot to Edge of Bed: 5: Supervision Details for Bed Mobility Assistance: requires increased time and hand rails due to pain  Transfers Transfers: Squat Pivot Transfers (pt refused SPT ) Squat Pivot Transfers: 1: +2 Total assist;From elevated surface;With upper extremity assistance Squat Pivot Transfers: Patient Percentage: 60% Details for Transfer Assistance: pt refused to up to upright standing position due to pain; denies need for instruction or cues for sequencing; provided with multimodal cues for hand  placement and safety; required 2+ to shift hips to chair while WB through L LE; pt refused to donn R Prosthetic LE today due to pain and fatigue  Ambulation/Gait Ambulation/Gait Assistance: Not tested (comment) (refused) Stairs: No Wheelchair Mobility Wheelchair Mobility: No    Exercises     PT Diagnosis: Difficulty walking;Acute pain  PT Problem List: Decreased strength;Decreased activity tolerance;Decreased balance;Decreased mobility;Decreased knowledge of use of DME;Decreased safety awareness;Pain PT Treatment Interventions: DME instruction;Gait training;Stair training;Functional mobility training;Therapeutic activities;Therapeutic exercise;Balance training;Neuromuscular re-education;Patient/family education   PT Goals Acute Rehab PT Goals PT Goal Formulation: With patient Time For Goal Achievement: 02/02/13 Potential to Achieve Goals: Good Pt will go Supine/Side to Sit: with modified independence PT Goal: Supine/Side to Sit - Progress: Goal set today Pt will go Sit to Supine/Side: with modified independence PT Goal: Sit to Supine/Side - Progress: Goal set today Pt will go Sit to Stand: with modified independence PT Goal: Sit to Stand - Progress: Goal set today Pt will go Stand to Sit: with modified independence PT Goal: Stand to Sit - Progress: Goal set today Pt will Ambulate: >150 feet;with supervision;with least restrictive assistive device PT Goal: Ambulate - Progress: Goal set today Pt will Go Up / Down Stairs: 6-9 stairs;with supervision;with least restrictive assistive device PT Goal: Up/Down Stairs - Progress: Goal set today  Visit Information  Last PT Received On: 01/26/13 Assistance Needed: +2    Subjective Data  Subjective: Pt required max encouragement to participate in therapy; agreeable to SPT to chair  Patient Stated Goal: home    Prior Functioning  Home Living Lives With: Alone  Available Help at Discharge: Other (Comment) (pt is applying for HH nurse/aide   ) Type of Home: Apartment ([place] housing authority ) Home Access: Stairs to enter Secretary/administrator of Steps: 6 Entrance Stairs-Rails: None Home Layout: One level Bathroom Shower/Tub: Engineer, manufacturing systems:  (uses bedside commode) Bathroom Accessibility: No Home Adaptive Equipment: Walker - rolling;Straight cane;Wheelchair - manual;Shower chair without back;Bedside commode/3-in-1 Prior Function Level of Independence: Independent with assistive device(s) (small amount of cooking; uses Wheelchair and RW ) Able to Take Stairs?:  (Crawls) Driving: No Vocation: On disability Comments: patient's mom visits her once or twice a week to take her grocery shopping.     Cognition  Cognition Arousal/Alertness: Awake/alert Behavior During Therapy: Impulsive Overall Cognitive Status: Within Functional Limits for tasks assessed    Extremity/Trunk Assessment Right Lower Extremity Assessment RLE ROM/Strength/Tone: Due to pain;Unable to fully assess (pt refused to put Prosthetic leg on; unable to assess) RLE Sensation: History of peripheral neuropathy Left Lower Extremity Assessment LLE ROM/Strength/Tone: WFL for tasks assessed LLE Sensation: History of peripheral neuropathy Trunk Assessment Trunk Assessment: Kyphotic   Balance Balance Balance Assessed: Yes Static Sitting Balance Static Sitting - Balance Support: Left upper extremity supported;Feet unsupported Static Sitting - Level of Assistance: 5: Stand by assistance  End of Session PT - End of Session Equipment Utilized During Treatment: Gait belt Activity Tolerance: Patient limited by pain;Patient limited by fatigue Patient left: in chair;with call bell/phone within reach Nurse Communication: Mobility status  GP     Donell Sievert, [place] [phone] 01/26/2013, 3:13 PM

## 2013-01-26 NOTE — Progress Notes (Signed)
[facility] PROGRESS NOTE  [name] ZOX:[id number] DOB: 1961-06-05 DOA: 01/23/2013  PCP: [name], MD Endocrinologist: Dr. Sharl Ma  Past medical history:  Past Medical History  Diagnosis Date  . Diabetic neuropathy   . Cellulitis   . Thyroid disease   . Hyperlipidemia     not on meds  . Heart murmur   . Arthritis   . Neuromuscular disorder   . Hypertension     hx of;but doesn't require meds anymore;has been off of x 71yr  . Cough   . Pneumonia     hx of;>19yr ago   . Headache(784.0)     occasionally  . Concussion as a teenager    slight  . Peripheral neuropathy   . Joint pain   . Joint swelling   . Dry skin   . Diarrhea     pt takes Reglan tid  . Urinary frequency   . Urinary urgency   . History of kidney stones   . Nocturia   . Hypothyroidism     takes Synthroid daily  . Diabetes mellitus     Novolog,Levemir,and Lovaza daily  . Depression     takes CYmbalta daily  . Peripheral edema     takes Lasix daily  . MRSA (methicillin resistant staph aureus) culture positive     hx of 2012  . Staph infection 2007    Consultants: Gen Surgery  Procedures: Surgical I&D 6/6.  Antibiotics: IV Vanc 6/5-->6/8 IV Ertapenem 6/5--> Doxycycline 6/8  Subjective: Patient sleepy but easily arousable. Complains of 5/10 pain in right groin. Passing urine but does not remember when she passed last.  Objective: Vital Signs  Filed Vitals:   01/25/13 0637 01/25/13 1452 01/25/13 2114 01/26/13 0639  BP: 116/64 123/58 136/56 136/70  Pulse: 73 63 65 83  Temp: 98.7 F (37.1 C) 98 F (36.7 C) 99.1 F (37.3 C) 97.4 F (36.3 C)  TempSrc: Oral Oral Oral Oral  Resp: 18 16 17 17   Height:      Weight:      SpO2: 97% 93% 92% 92%    Intake/Output Summary (Last 24 hours) at 01/26/13 1427 Last data filed at 01/26/13 8119  Gross per 24 hour  Intake    240 ml  Output   1200 ml  Net   -960 ml   Filed Weights   01/23/13 2300 01/23/13 2359  Weight: 105 kg (231  lb 7.7 oz) 105 kg (231 lb 7.7 oz)   General appearance: cooperative, no distress and moderately obese Head: Normocephalic, without obvious abnormality, atraumatic Throat: Dry Mucus membranes Resp: clear to auscultation bilaterally Cardio: regular rate and rhythm, S1, S2 normal. Systolic murmur in mitral area (old per patient). no click, rub or gallop GI: soft, non-tender; bowel sounds normal; no masses,  no organomegaly Extremities: Right groin area covered with dressing. Not fully examined today due to surgery yesterday. She is s/p BKA on right. Pulses: 2+ and symmetric Skin: erythema in righ groin area; wound with clean dressings Lymph nodes: Inguinal adenopathy: palpable in right groin Neurologic: Somnolent but arousable. Oriented x 3. No focal deficits.  Lab Results:  Basic Metabolic Panel:  Recent Labs Lab 01/23/13 1903 01/23/13 2258 01/24/13 0555 01/25/13 0455 01/26/13 0423  NA 133* 131* 129* 129* 132*  K 4.0 3.5 3.7 4.2 4.5  CL 91* 90* 90* 90* 96  CO2 30 27 24 26 23   GLUCOSE 298* 316* 355* 346* 252*  BUN 35* 34* 37* 43* 57*  CREATININE  1.03 0.93 1.21* 3.48* 4.74*  CALCIUM 9.8 10.0 9.3 9.0 8.9  MG  --  1.8  --   --   --   PHOS  --  4.2  --   --   --    Liver Function Tests:  Recent Labs Lab 01/23/13 2258 01/24/13 0555  AST 11 11  ALT 11 11  ALKPHOS 140* 155*  BILITOT 0.2* 0.1*  PROT 7.3 6.8  ALBUMIN 2.0* 1.6*   CBC:  Recent Labs Lab 01/23/13 1903 01/23/13 2258 01/24/13 0555 01/25/13 0455 01/26/13 0423  WBC 11.0* 12.4* 10.5 10.7* 8.8  NEUTROABS 7.1 8.1*  --   --   --   HGB 11.3* 11.9* 11.2* 10.4* 9.8*  HCT 33.4* 33.8* 32.5* 30.8* 29.2*  MCV 82.7 81.6 82.1 83.0 83.0  PLT 309 334 308 299 267   CBG:  Recent Labs Lab 01/25/13 1619 01/25/13 2008 01/25/13 2250 01/26/13 0649 01/26/13 1206  GLUCAP 481* 489* 342* 197* 280*    Recent Results (from the past 240 hour(s))  CULTURE, ROUTINE-ABSCESS     Status: None   Collection Time    01/18/13   5:48 AM      Result Value Range Status   Specimen Description ABSCESS   Final   Special Requests GROIN RIGHT   Final   Gram Stain     Final   Value: NO WBC SEEN     NO SQUAMOUS EPITHELIAL CELLS SEEN     MODERATE GRAM POSITIVE COCCI     IN PAIRS IN CHAINS   Culture     Final   Value: MODERATE GROUP B STREP(S.AGALACTIAE)ISOLATED     Note: TESTING AGAINST S. AGALACTIAE NOT ROUTINELY PERFORMED DUE TO PREDICTABILITY OF AMP/PEN/VAN SUSCEPTIBILITY.   Report Status 01/21/2013 FINAL   Final  CULTURE, BLOOD (ROUTINE X 2)     Status: None   Collection Time    01/18/13  6:45 AM      Result Value Range Status   Specimen Description BLOOD LEFT ARM   Final   Special Requests BOTTLES DRAWN AEROBIC AND ANAEROBIC 10CC   Final   Culture  Setup Time 01/18/2013 11:19   Final   Culture NO GROWTH 5 DAYS   Final   Report Status 01/24/2013 FINAL   Final  CULTURE, BLOOD (ROUTINE X 2)     Status: None   Collection Time    01/18/13  6:52 AM      Result Value Range Status   Specimen Description BLOOD LEFT HAND   Final   Special Requests BOTTLES DRAWN AEROBIC AND ANAEROBIC 10CC   Final   Culture  Setup Time 01/18/2013 11:19   Final   Culture NO GROWTH 5 DAYS   Final   Report Status 01/24/2013 FINAL   Final  SURGICAL PCR SCREEN     Status: Abnormal   Collection Time    01/24/13  5:54 AM      Result Value Range Status   MRSA, PCR NEGATIVE  NEGATIVE Final   Staphylococcus aureus POSITIVE (*) NEGATIVE Final   Comment:            The Xpert SA Assay (FDA     approved for NASAL specimens     in patients over 36 years of age),     is one component of     a comprehensive surveillance     program.  Test performance has     been validated by [employer] for patients  greater     than or equal to 22 year old.     It is not intended     to diagnose infection nor to     guide or monitor treatment.      Studies/Results: US Renal  01/25/2013   *RADIOLOGY REPORT*  Clinical Data:  Acute renal failure   RENAL/URINARY TRACT ULTRASOUND COMPLETE  Comparison:  Prior renal ultrasound 10/07/2011  Findings:  Right Kidney:  Normal in size (13.4 cm) and parenchymal echogenicity.  No evidence of mass or hydronephrosis.  Left Kidney:  Normal in size (13.1 cm) and parenchymal echogenicity.  No evidence of mass or hydronephrosis.  Bladder:  Decompressed with a Foley catheter in place.  IMPRESSION:  Normal study.  Foley catheter noted in the decompressed bladder.   Original Report Authenticated By: Malachy Moan, M.D.    Medications:  Scheduled: . atorvastatin  80 mg Oral q1800  . doxycycline  100 mg Oral Q12H  . ertapenem  500 mg Intravenous Q24H  . FLUoxetine  20 mg Oral Daily  . [START ON 01/27/2013] gabapentin  300 mg Oral Daily  . heparin subcutaneous  5,000 Units Subcutaneous Q8H  . insulin aspart  0-15 Units Subcutaneous TID WC  . insulin aspart  0-5 Units Subcutaneous QHS  . insulin detemir  60 Units Subcutaneous BID  . levothyroxine  25 mcg Oral QAC breakfast  . metoCLOPramide  5 mg Oral TID WC  . oxybutynin  5 mg Oral Daily  . pantoprazole  40 mg Oral Daily   Continuous: . sodium chloride 125 mL/hr at 01/26/13 1610   RUE:[id number], acetaminophen, hydrALAZINE, ondansetron (ZOFRAN) IV, ondansetron, oxyCODONE-acetaminophen  Assessment/Plan:  Principal Problem:   Abscess of groin, right Active Problems:   Diabetes mellitus type 2, uncontrolled, with complications   DEPRESSION   PERIPHERAL NEUROPATHY   HYPERCHOLESTEROLEMIA   Hyponatremia   HTN (hypertension)   Leukocytosis   Anemia of chronic disease   Hypothyroidism   ARF (acute renal failure)    Abscess of groin, right  Status post I&D 6/6. Continue Vanc and Ertapenem per pharmacy. Blood cultures from 5/31 shows no growth. Strep noted from abscess. -continue invanz -vanc discontinue given renal failure -started on doxycycline -non toxic.  ARF Creatinine has risen significantly.most likely due to ATN; was also on  nephrotoxic agents (HCTZ and vanc) -holding nephrotoxic agents -foley place to follow urine output closely -renal US essentially normal -Cr up 4.7 today -will continue IVF's -will involve renal service in the case.  Uncontrolled DIABETES MELLITUS, TYPE II  Poorly controlled. Continue Levemir and SSI for now.  -HBA1C is 12.9. -will adjust as needed base on CBG's   DEPRESSION  Appears stable at this time, normal affect -Continue Fluoxetine.  PERIPHERAL NEUROPATHY  Complication of diabetes, continue Neurontin. But will decrease dose given worsening renal function and Cr clearance    Hyponatremia  Most likely due to hypovolemia. Continue IV fluids. Stopped diuretics.  HTN (hypertension)  Continue to monitor closely.   Anemia of chronic disease  Hg and Hct stable and at pt's baseline.  Will monitor  Code Status:  Full Code DVT Prophylaxis:   Heparin Family Communication: Discussed with patient  Disposition Plan: home with [facility] services when medically stable    LOS: 3 days   Rhylan Kagel  [facility] Pager [phone]  01/26/2013, 2:27 PM  If 8PM-8AM, please contact night-coverage at [email or web address], password [facility]

## 2013-01-27 ENCOUNTER — Encounter (HOSPITAL_COMMUNITY): Payer: Self-pay | Admitting: Surgery

## 2013-01-27 LAB — GLUCOSE, CAPILLARY
Glucose-Capillary: 402 mg/dL — ABNORMAL HIGH (ref 70–99)
Glucose-Capillary: 146 mg/dL — ABNORMAL HIGH (ref 70–99)

## 2013-01-27 LAB — RENAL FUNCTION PANEL
BUN: 49 mg/dL — ABNORMAL HIGH (ref 6–23)
Chloride: 100 mEq/L (ref 96–112)
Creatinine, Ser: 2.75 mg/dL — ABNORMAL HIGH (ref 0.50–1.10)
Glucose, Bld: 329 mg/dL — ABNORMAL HIGH (ref 70–99)
Potassium: 4.8 mEq/L (ref 3.5–5.1)

## 2013-01-27 MED ORDER — INSULIN ASPART 100 UNIT/ML ~~LOC~~ SOLN
0.0000 [IU] | Freq: Three times a day (TID) | SUBCUTANEOUS | Status: DC
  Administered 2013-01-27: 3 [IU] via SUBCUTANEOUS
  Administered 2013-01-30: 7 [IU] via SUBCUTANEOUS
  Administered 2013-01-30: 3 [IU] via SUBCUTANEOUS
  Administered 2013-01-30: 4 [IU] via SUBCUTANEOUS
  Administered 2013-01-31 – 2013-02-01 (×3): 3 [IU] via SUBCUTANEOUS
  Administered 2013-02-01 – 2013-02-02 (×2): 4 [IU] via SUBCUTANEOUS
  Administered 2013-02-02: 7 [IU] via SUBCUTANEOUS
  Administered 2013-02-02 – 2013-02-03 (×4): 4 [IU] via SUBCUTANEOUS
  Administered 2013-02-05: 3 [IU] via SUBCUTANEOUS
  Administered 2013-02-06: 7 [IU] via SUBCUTANEOUS

## 2013-01-27 MED ORDER — MORPHINE SULFATE 2 MG/ML IJ SOLN
1.0000 mg | INTRAMUSCULAR | Status: DC | PRN
  Administered 2013-01-28: 2 mg via INTRAVENOUS
  Administered 2013-01-29: 3 mg via INTRAVENOUS
  Filled 2013-01-27: qty 1
  Filled 2013-01-27: qty 2
  Filled 2013-01-27: qty 1

## 2013-01-27 MED ORDER — INSULIN DETEMIR 100 UNIT/ML ~~LOC~~ SOLN
70.0000 [IU] | Freq: Two times a day (BID) | SUBCUTANEOUS | Status: DC
  Administered 2013-01-27 – 2013-02-05 (×14): 70 [IU] via SUBCUTANEOUS
  Filled 2013-01-27 (×19): qty 0.7

## 2013-01-27 NOTE — Op Note (Signed)
NAMEHALI, BALGOBIN               ACCOUNT NO.:  [id number]  MEDICAL RECORD NO.:  [id number]  LOCATION:                               FACILITY:  [facility]  PHYSICIAN:  Maisie Fus A. Suvan Stcyr, M.D.DATE OF BIRTH:  [birth year]  DATE OF PROCEDURE:  01/24/2013 DATE OF DISCHARGE:                              OPERATIVE REPORT   PREOPERATIVE DIAGNOSIS:  RIGHT GROIN ABSCESS.  POSTOPERATIVE DIAGNOSIS:  RIGHT GROIN ABSCESS.  PROCEDURE:  INCISION AND DRAINAGE OF RIGHT GROIN ABSCESS.  SURGEON:  Dortha Schwalbe, MD.  ANESTHESIA:  GENERAL ENDOTRACHEAL ANESTHESIA.  ESTIMATED BLOOD LOSS:  Minimal.  SPECIMENS:  None.  DRAINS:  None.  INDICATIONS FOR PROCEDURE:  The patient is a 52 year old female with multiple medical problems, who has abscess in her right groin region. This was opened, but required drainage today in the operating room. Risks, benefits, and alternative therapies were discussed.  This was discussed with the patient and informed consent was obtained. Alternatives of the surgery were discussed.  She agreed to proceed.  DESCRIPTION OF PROCEDURE:  The patient was brought to the operating room after being seen in the holding area.  Questions answered.  The right groin was marked.  She then was placed back in the operating room where general anesthesia was initiated.  She was placed in lithotomy.  The wound was in the right groin area just lateral to her right labia and tracked up for her abdomen.  This was already open and draining.  After sterile prep and drape, a time-out was done, I opened it further with cautery.  It tracked for about 5 cm superiorly and then about 2 more inferiorly.  It was very clean from the inside with some exposed muscle, but the muscle was viable.  There was no evidence Fournier's or any significant invasive type infection.  This was irrigated out and packed with a saline soaked Kerlix.  Dressing was applied, all final counts of sponge, needle, instruments,  found to be correct at this portion of the case.  The patient was awoke, extubated, taken to recovery in satisfactory condition.     Dreyden Rohrman A. Taura Lamarre, M.D.     TAC/MEDQ  D:  01/24/2013  T:  01/24/2013  Job:  [id number]

## 2013-01-27 NOTE — Progress Notes (Signed)
[facility] ROUNDING NOTE   Subjective:   Interval History:stable   Objective:  Vital signs in last 24 hours:  Temp:  [97.9 F (36.6 C)-98.4 F (36.9 C)] 98.4 F (36.9 C) (06/08 2254) Pulse Rate:  [53-74] 53 (06/08 2254) Resp:  [20] 20 (06/08 2254) BP: (119-133)/(53-64) 133/53 mmHg (06/08 2254) SpO2:  [92 %-96 %] 92 % (06/08 2254) Weight:  [105.371 kg (232 lb 4.8 oz)] 105.371 kg (232 lb 4.8 oz) (06/08 1500)  Weight change:  Filed Weights   01/23/13 2300 01/23/13 2359 01/26/13 1500  Weight: 105 kg (231 lb 7.7 oz) 105 kg (231 lb 7.7 oz) 105.371 kg (232 lb 4.8 oz)    Intake/Output: I/O last 3 completed shifts: In: 800 [P.O.:800] Out: 2350 [Urine:2350]   Intake/Output this shift:     CVS- RRR RS- CTA ABD- BS present soft non-distended EXT- no edema   Basic Metabolic Panel:  Recent Labs Lab 01/23/13 2258 01/24/13 0555 01/25/13 0455 01/26/13 0423 01/27/13 0405  NA 131* 129* 129* 132* 134*  K 3.5 3.7 4.2 4.5 4.8  CL 90* 90* 90* 96 100  CO2 27 24 26 23 22   GLUCOSE 316* 355* 346* 252* 329*  BUN 34* 37* 43* 57* 49*  CREATININE 0.93 1.21* 3.48* 4.74* 2.75*  CALCIUM 10.0 9.3 9.0 8.9 8.8  MG 1.8  --   --   --   --   PHOS 4.2  --   --   --  5.0*    Liver Function Tests:  Recent Labs Lab 01/23/13 2258 01/24/13 0555 01/27/13 0405  AST 11 11  --   ALT 11 11  --   ALKPHOS 140* 155*  --   BILITOT 0.2* 0.1*  --   PROT 7.3 6.8  --   ALBUMIN 2.0* 1.6* 1.9*   No results found for this basename: LIPASE, AMYLASE,  in the last 168 hours No results found for this basename: AMMONIA,  in the last 168 hours  CBC:  Recent Labs Lab 01/23/13 1903 01/23/13 2258 01/24/13 0555 01/25/13 0455 01/26/13 0423  WBC 11.0* 12.4* 10.5 10.7* 8.8  NEUTROABS 7.1 8.1*  --   --   --   HGB 11.3* 11.9* 11.2* 10.4* 9.8*  HCT 33.4* 33.8* 32.5* 30.8* 29.2*  MCV 82.7 81.6 82.1 83.0 83.0  PLT 309 334 308 299 267    Cardiac Enzymes: No results found for this  basename: CKTOTAL, CKMB, CKMBINDEX, TROPONINI,  in the last 168 hours  BNP: No components found with this basename: POCBNP,   CBG:  Recent Labs Lab 01/26/13 0649 01/26/13 1206 01/26/13 1706 01/26/13 2251 01/27/13 0626  GLUCAP 197* 280* 335* 402* 324*    Microbiology: Results for orders placed during the hospital encounter of 01/23/13  SURGICAL PCR SCREEN     Status: Abnormal   Collection Time    01/24/13  5:54 AM      Result Value Range Status   MRSA, PCR NEGATIVE  NEGATIVE Final   Staphylococcus aureus POSITIVE (*) NEGATIVE Final   Comment:            The Xpert SA Assay (FDA     approved for NASAL specimens     in patients over 36 years of age),     is one component of     a comprehensive surveillance     program.  Test performance has     been validated by [employer] for patients greater  than or equal to 3 year old.     It is not intended     to diagnose infection nor to     guide or monitor treatment.    Coagulation Studies: No results found for this basename: LABPROT, INR,  in the last 72 hours  Urinalysis:  Recent Labs  01/25/13 0919 01/26/13 1728  COLORURINE YELLOW YELLOW  LABSPEC 1.019 1.016  PHURINE 5.0 5.0  GLUCOSEU >1000* 250*  HGBUR MODERATE* MODERATE*  BILIRUBINUR NEGATIVE NEGATIVE  KETONESUR NEGATIVE NEGATIVE  PROTEINUR >300* 100*  UROBILINOGEN 0.2 1.0  NITRITE NEGATIVE NEGATIVE  LEUKOCYTESUR NEGATIVE NEGATIVE      Imaging: US Renal  01/25/2013   *RADIOLOGY REPORT*  Clinical Data:  Acute renal failure  RENAL/URINARY TRACT ULTRASOUND COMPLETE  Comparison:  Prior renal ultrasound 10/07/2011  Findings:  Right Kidney:  Normal in size (13.4 cm) and parenchymal echogenicity.  No evidence of mass or hydronephrosis.  Left Kidney:  Normal in size (13.1 cm) and parenchymal echogenicity.  No evidence of mass or hydronephrosis.  Bladder:  Decompressed with a Foley catheter in place.  IMPRESSION:  Normal study.  Foley catheter noted in the  decompressed bladder.   Original Report Authenticated By: Malachy Moan, M.D.     Medications:   . sodium chloride 125 mL/hr at 01/26/13 2348   . atorvastatin  80 mg Oral q1800  . doxycycline  100 mg Oral Q12H  . ertapenem  500 mg Intravenous Q24H  . FLUoxetine  20 mg Oral Daily  . gabapentin  300 mg Oral Daily  . heparin subcutaneous  5,000 Units Subcutaneous Q8H  . insulin aspart  0-15 Units Subcutaneous TID WC  . insulin aspart  0-5 Units Subcutaneous QHS  . insulin detemir  65 Units Subcutaneous BID  . levothyroxine  25 mcg Oral QAC breakfast  . metoCLOPramide  5 mg Oral TID WC  . oxybutynin  5 mg Oral Daily  . pantoprazole  40 mg Oral Daily   acetaminophen, acetaminophen, hydrALAZINE, ondansetron (ZOFRAN) IV, ondansetron, oxyCODONE-acetaminophen  Assessment/ Plan:  The patient is a 52 y.o. year-old with hx of DM2, HL, HTN (off meds), cellulitis and depression presented on 6/5 with draining abcess of R groin. She had abcess drained 5d PTA and had been started on po abx but the area continued to drain.    Acute kidney injury- in patient with probable underlying diabetic nephropathy (proteinuria) who received IV contrast on 6/5 and IV Vanc 6/5 thru 6/7. Doubt vanc is the cause, suspect contrast nephropathy. Looks euvolemic and without uremic symptoms, making lots of urine. Creatinine improved today   LOS: 4 Trumaine Wimer W @TODAY @9 :17 AM

## 2013-01-27 NOTE — Progress Notes (Signed)
3 Days Post-Op  Subjective: She is complaining of pain and being miserable.  She has pus rolling out of the wound, the dressing has only been changed daily.  Objective: Vital signs in last 24 hours: Temp:  [97.9 F (36.6 C)-98.4 F (36.9 C)] 98.4 F (36.9 C) (06/08 2254) Pulse Rate:  [53-74] 53 (06/08 2254) Resp:  [20] 20 (06/08 2254) BP: (119-133)/(53-64) 133/53 mmHg (06/08 2254) SpO2:  [92 %-96 %] 92 % (06/08 2254) Weight:  [105.371 kg (232 lb 4.8 oz)] 105.371 kg (232 lb 4.8 oz) (06/08 1500) Last BM Date:  (pta) 800 PO recorded, Afebrile, VSS  Intake/Output from previous day: 06/08 0701 - 06/09 0700 In: 800 [P.O.:800] Out: 1600 [Urine:1600] Intake/Output this shift:    General appearance: alert, mild distress and she isn't toxic. Skin: Skin color, texture, turgor normal. No rashes or lesions or Right groin:  4 x 4 packed into it.  She has purulent drainage rolling out of it.  It's hard to get her to spread her leg enough to see into the depth well.  it looks open just draining allot.  Lab Results:   Recent Labs  01/25/13 0455 01/26/13 0423  WBC 10.7* 8.8  HGB 10.4* 9.8*  HCT 30.8* 29.2*  PLT 299 267    BMET  Recent Labs  01/26/13 0423 01/27/13 0405  NA 132* 134*  K 4.5 4.8  CL 96 100  CO2 23 22  GLUCOSE 252* 329*  BUN 57* 49*  CREATININE 4.74* 2.75*  CALCIUM 8.9 8.8   PT/INR No results found for this basename: LABPROT, INR,  in the last 72 hours   Recent Labs Lab 01/23/13 2258 01/24/13 0555 01/27/13 0405  AST 11 11  --   ALT 11 11  --   ALKPHOS 140* 155*  --   BILITOT 0.2* 0.1*  --   PROT 7.3 6.8  --   ALBUMIN 2.0* 1.6* 1.9*     Lipase     Component Value Date/Time   LIPASE 38 12/05/2012 0317     Studies/Results: US Renal  01/25/2013   *RADIOLOGY REPORT*  Clinical Data:  Acute renal failure  RENAL/URINARY TRACT ULTRASOUND COMPLETE  Comparison:  Prior renal ultrasound 10/07/2011  Findings:  Right Kidney:  Normal in size (13.4 cm) and  parenchymal echogenicity.  No evidence of mass or hydronephrosis.  Left Kidney:  Normal in size (13.1 cm) and parenchymal echogenicity.  No evidence of mass or hydronephrosis.  Bladder:  Decompressed with a Foley catheter in place.  IMPRESSION:  Normal study.  Foley catheter noted in the decompressed bladder.   Original Report Authenticated By: Malachy Moan, M.D.    Medications: . atorvastatin  80 mg Oral q1800  . doxycycline  100 mg Oral Q12H  . ertapenem  500 mg Intravenous Q24H  . FLUoxetine  20 mg Oral Daily  . gabapentin  300 mg Oral Daily  . heparin subcutaneous  5,000 Units Subcutaneous Q8H  . insulin aspart  0-15 Units Subcutaneous TID WC  . insulin aspart  0-5 Units Subcutaneous QHS  . insulin detemir  65 Units Subcutaneous BID  . levothyroxine  25 mcg Oral QAC breakfast  . metoCLOPramide  5 mg Oral TID WC  . oxybutynin  5 mg Oral Daily  . pantoprazole  40 mg Oral Daily    Assessment/Plan INCISION AND DRAINAGE ABSCESS right groin abcess, 01/24/2013, Maisie Fus A. Cornett, MD. +MRSA on nasal swab Wound culture: MODERATE GROUP B STREP(S.AGALACTIAE)ISOLATED No growth on blood cultures  DIABETES  MELLITUS, TYPE II  - uncontrolled, with neuropathy  Severe renal insuffiencey (creatinine up to 4.74, yesterday down to 2.75) DEPRESSION HTN (hypertension) Hyponatremia Anemia of chronic disease  Plan:  I will get the staff to reculture the wound.  Increase dressing changes to QID. Add some IV pain med for dressing changes. First time I have seen it but Doxycycline does not seem to be working very well. Recheck labs in AM.       LOS: 4 days    Mackenzie Groom 01/27/2013

## 2013-01-27 NOTE — Progress Notes (Signed)
I have seen and examined the patient and agree with the assessment and plans. Will make NPO post midnight in case I want to wash out wound in OR tomorrow  [name] A. Magnus Ivan  MD, FACS

## 2013-01-27 NOTE — Progress Notes (Signed)
Physical Therapy Treatment Patient Details Name: [name] MRN: [id number] DOB: 14-Oct-1960 Today's Date: 01/27/2013 Time: 7829-5621 PT Time Calculation (min): 24 min  PT Assessment / Plan / Recommendation Comments on Treatment Session  Pt cont to require 2+ (A) with transfers and mobility. Today pt was more agreeable when discussing D/C disposition. PT had lengthy discussiong about the benefits from D/C to ST-SNF and safety issues with D/C home when requiring this level of (A). Pt agreeagble to D/C to ST-SNF, Social worker notified. Will cont to f/u with pt while in acute setting to maximize functional mobility and indepdence.    Follow Up Recommendations  SNF;Supervision/Assistance - 24 hour     Does the patient have the potential to tolerate intense rehabilitation     Barriers to Discharge        Equipment Recommendations  None recommended by PT    Recommendations for Other Services    Frequency Min 3X/week   Plan Discharge plan remains appropriate;Frequency needs to be updated    Precautions / Restrictions Precautions Precautions: Fall Restrictions Weight Bearing Restrictions: No   Pertinent Vitals/Pain 9/10 in R side; RN notified.     Mobility  Bed Mobility Bed Mobility: Supine to Sit;Sitting - Scoot to Edge of Bed Supine to Sit: 3: Mod assist;HOB elevated Details for Bed Mobility Assistance: pt required increased (A) for bed mobility today; c/o increased pain in R side; required (A) advancing R LE up to EOB and trunk into upright sitting position; required incrased time and multimodal cues for hand placement  Transfers Transfers: Squat Pivot Transfers Squat Pivot Transfers: 1: +2 Total assist;With upper extremity assistance;From elevated surface Squat Pivot Transfers: Patient Percentage: 40% Details for Transfer Assistance: required 2 attempts for transfer to chair; pt became very anxious and c/o pain with intial attempt; required 2+ (A) to shift weight fwd onto L  LE and pivot into chair; pt very anxious and fearful during transfer; repeatedly saying " i dont want to fall" Ambulation/Gait Ambulation/Gait Assistance: Not tested (comment) (pt refused to donn prosthesis ) Stairs: No Wheelchair Mobility Wheelchair Mobility: No    Exercises     PT Diagnosis:    PT Problem List:   PT Treatment Interventions:     PT Goals Acute Rehab PT Goals PT Goal Formulation: With patient Time For Goal Achievement: 02/02/13 Potential to Achieve Goals: Good PT Goal: Supine/Side to Sit - Progress: Progressing toward goal PT Goal: Sit to Stand - Progress: Progressing toward goal PT Goal: Stand to Sit - Progress: Progressing toward goal PT Goal: Up/Down Stairs - Progress: Discontinued (comment)  Visit Information  Last PT Received On: 01/27/13 Assistance Needed: +2    Subjective Data  Subjective: pt lying supine; MD and PA present; pt agreeable to therapy however refusing to donn prosthesis due to dizziness and fatigue; agreeable to SPT transfer  Patient Stated Goal: for right side to not hurt   Cognition  Cognition Arousal/Alertness: Awake/alert Behavior During Therapy: Impulsive Overall Cognitive Status: Within Functional Limits for tasks assessed    Balance  Balance Balance Assessed: Yes Static Sitting Balance Static Sitting - Balance Support: Bilateral upper extremity supported;Feet unsupported Static Sitting - Level of Assistance: 4: Min assist;5: Stand by assistance Static Sitting - Comment/# of Minutes: initally pt required min (A) and demo posterior/lateral lean; required tactile and verbal cues to sit in upright position and progressed to SBA; pt became very dizzy and nausous sitting EOB; tolerated sitting ~5 min until dizziness subsided   End of Session  PT - End of Session Equipment Utilized During Treatment: Gait belt Activity Tolerance: Other (comment);Patient limited by fatigue;Patient limited by pain (dizziness ) Patient left: in  chair;with call bell/phone within reach Nurse Communication: Mobility status   GP     Donell Sievert, [place] [phone] 01/27/2013, 11:40 AM

## 2013-01-27 NOTE — Progress Notes (Signed)
[facility] PROGRESS NOTE  [name] AOZ:[id number] DOB: 1960/11/25 DOA: 01/23/2013  PCP: [name], MD Endocrinologist: Dr. Sharl Ma  Past medical history:  Past Medical History  Diagnosis Date  . Diabetic neuropathy   . Cellulitis   . Thyroid disease   . Hyperlipidemia     not on meds  . Heart murmur   . Arthritis   . Neuromuscular disorder   . Hypertension     hx of;but doesn't require meds anymore;has been off of x 64yr  . Pneumonia     hx of;>80yr ago   . Concussion as a teenager    slight  . Diarrhea     pt takes Reglan tid  . History of kidney stones   . Hypothyroidism     takes Synthroid daily  . Diabetes mellitus     Novolog,Levemir,and Lovaza daily  . Depression     takes CYmbalta daily  . Peripheral edema     takes Lasix daily  . MRSA (methicillin resistant staph aureus) culture positive     hx of 2012  . Staph infection 2007    Consultants: Gen Surgery  Procedures: Surgical I&D 6/6.  Antibiotics: IV Vanc 6/5-->6/8 IV Ertapenem 6/5--> Doxycycline 6/8  Subjective: Patient sleepy but easily arousable. Complains of 7/10 pain in right groin essentially with dressing changes. Afebrile; Cr improving; making a lot of urine.  Objective: Vital Signs  Filed Vitals:   01/26/13 0639 01/26/13 1500 01/26/13 1619 01/26/13 2254  BP: 136/70  119/64 133/53  Pulse: 83  74 53  Temp: 97.4 F (36.3 C)  97.9 F (36.6 C) 98.4 F (36.9 C)  TempSrc: Oral  Oral Oral  Resp: 17   20  Height:      Weight:  105.371 kg (232 lb 4.8 oz)    SpO2: 92%  96% 92%    Intake/Output Summary (Last 24 hours) at 01/27/13 1256 Last data filed at 01/26/13 2258  Gross per 24 hour  Intake    240 ml  Output   1600 ml  Net  -1360 ml   Filed Weights   01/23/13 2300 01/23/13 2359 01/26/13 1500  Weight: 105 kg (231 lb 7.7 oz) 105 kg (231 lb 7.7 oz) 105.371 kg (232 lb 4.8 oz)   General appearance: cooperative, no distress and moderately obese Head: Normocephalic,  without obvious abnormality, atraumatic Throat: Dry Mucus membranes Resp: clear to auscultation bilaterally Cardio: regular rate and rhythm, S1, S2 normal. Systolic murmur in mitral area (old per patient). no click, rub or gallop GI: soft, non-tender; bowel sounds normal; no masses,  no organomegaly Extremities: Right groin area covered with dressing. Not fully examined today due to surgery yesterday. She is s/p BKA on right. Pulses: 2+ and symmetric Skin: erythema in righ groin area; wound with clean dressings Lymph nodes: Inguinal adenopathy: palpable in right groin Neurologic: Somnolent but arousable. Oriented x 3. No focal deficits.  Lab Results:  Basic Metabolic Panel:  Recent Labs Lab 01/23/13 2258 01/24/13 0555 01/25/13 0455 01/26/13 0423 01/27/13 0405  NA 131* 129* 129* 132* 134*  K 3.5 3.7 4.2 4.5 4.8  CL 90* 90* 90* 96 100  CO2 27 24 26 23 22   GLUCOSE 316* 355* 346* 252* 329*  BUN 34* 37* 43* 57* 49*  CREATININE 0.93 1.21* 3.48* 4.74* 2.75*  CALCIUM 10.0 9.3 9.0 8.9 8.8  MG 1.8  --   --   --   --   PHOS 4.2  --   --   --  5.0*   Liver Function Tests:  Recent Labs Lab 01/23/13 2258 01/24/13 0555 01/27/13 0405  AST 11 11  --   ALT 11 11  --   ALKPHOS 140* 155*  --   BILITOT 0.2* 0.1*  --   PROT 7.3 6.8  --   ALBUMIN 2.0* 1.6* 1.9*   CBC:  Recent Labs Lab 01/23/13 1903 01/23/13 2258 01/24/13 0555 01/25/13 0455 01/26/13 0423  WBC 11.0* 12.4* 10.5 10.7* 8.8  NEUTROABS 7.1 8.1*  --   --   --   HGB 11.3* 11.9* 11.2* 10.4* 9.8*  HCT 33.4* 33.8* 32.5* 30.8* 29.2*  MCV 82.7 81.6 82.1 83.0 83.0  PLT 309 334 308 299 267   CBG:  Recent Labs Lab 01/26/13 1206 01/26/13 1706 01/26/13 2251 01/27/13 0626 01/27/13 1139  GLUCAP 280* 335* 402* 324* 334*    Recent Results (from the past 240 hour(s))  CULTURE, ROUTINE-ABSCESS     Status: None   Collection Time    01/18/13  5:48 AM      Result Value Range Status   Specimen Description ABSCESS   Final    Special Requests GROIN RIGHT   Final   Gram Stain     Final   Value: NO WBC SEEN     NO SQUAMOUS EPITHELIAL CELLS SEEN     MODERATE GRAM POSITIVE COCCI     IN PAIRS IN CHAINS   Culture     Final   Value: MODERATE GROUP B STREP(S.AGALACTIAE)ISOLATED     Note: TESTING AGAINST S. AGALACTIAE NOT ROUTINELY PERFORMED DUE TO PREDICTABILITY OF AMP/PEN/VAN SUSCEPTIBILITY.   Report Status 01/21/2013 FINAL   Final  CULTURE, BLOOD (ROUTINE X 2)     Status: None   Collection Time    01/18/13  6:45 AM      Result Value Range Status   Specimen Description BLOOD LEFT ARM   Final   Special Requests BOTTLES DRAWN AEROBIC AND ANAEROBIC 10CC   Final   Culture  Setup Time 01/18/2013 11:19   Final   Culture NO GROWTH 5 DAYS   Final   Report Status 01/24/2013 FINAL   Final  CULTURE, BLOOD (ROUTINE X 2)     Status: None   Collection Time    01/18/13  6:52 AM      Result Value Range Status   Specimen Description BLOOD LEFT HAND   Final   Special Requests BOTTLES DRAWN AEROBIC AND ANAEROBIC 10CC   Final   Culture  Setup Time 01/18/2013 11:19   Final   Culture NO GROWTH 5 DAYS   Final   Report Status 01/24/2013 FINAL   Final  SURGICAL PCR SCREEN     Status: Abnormal   Collection Time    01/24/13  5:54 AM      Result Value Range Status   MRSA, PCR NEGATIVE  NEGATIVE Final   Staphylococcus aureus POSITIVE (*) NEGATIVE Final   Comment:            The Xpert SA Assay (FDA     approved for NASAL specimens     in patients over 33 years of age),     is one component of     a comprehensive surveillance     program.  Test performance has     been validated by [employer] for patients greater     than or equal to 7 year old.     It is not intended  to diagnose infection nor to     guide or monitor treatment.      Studies/Results: No results found.  Medications:  Scheduled: . atorvastatin  80 mg Oral q1800  . doxycycline  100 mg Oral Q12H  . ertapenem  500 mg Intravenous Q24H  .  FLUoxetine  20 mg Oral Daily  . gabapentin  300 mg Oral Daily  . heparin subcutaneous  5,000 Units Subcutaneous Q8H  . insulin aspart  0-20 Units Subcutaneous TID WC  . insulin aspart  0-5 Units Subcutaneous QHS  . insulin detemir  70 Units Subcutaneous BID  . levothyroxine  25 mcg Oral QAC breakfast  . metoCLOPramide  5 mg Oral TID WC  . oxybutynin  5 mg Oral Daily  . pantoprazole  40 mg Oral Daily   Continuous: . sodium chloride 125 mL/hr at 01/26/13 2348   RUE:[id number], acetaminophen, hydrALAZINE, morphine injection, ondansetron (ZOFRAN) IV, ondansetron, oxyCODONE-acetaminophen  Assessment/Plan:  Principal Problem:   Abscess of groin, right Active Problems:   Diabetes mellitus type 2, uncontrolled, with complications   DEPRESSION   PERIPHERAL NEUROPATHY   HYPERCHOLESTEROLEMIA   Hyponatremia   HTN (hypertension)   Leukocytosis   Anemia of chronic disease   Hypothyroidism   ARF (acute renal failure)   Proteinuria    Abscess of groin, right  Status post I&D 6/6. Continue Vanc and Ertapenem per pharmacy. Blood cultures from 5/31 shows no growth. Strep noted from abscess. -vanc discontinue given renal failure -continue doxycycline and invanz -non toxic in appearance -Per surgery probably wound wash/further debridement in OR on 6/10.  ARF Creatinine has risen significantly. Most likely due to ATN; was also on nephrotoxic agents (HCTZ and vanc) and was exposed to IV contrast on 6/5 -holding nephrotoxic agents -foley place to follow urine output closely -renal US essentially normal -UA no suggesting UTI -Cr improved to 2.7 today -will continue IVF's -will follow renal rec's.  Uncontrolled DIABETES MELLITUS, TYPE II  Poorly controlled.  -HBA1C is 12.9. -will increase levemir and change SSI to resistance   DEPRESSION  Appears stable at this time, normal affect -Continue Fluoxetine.  PERIPHERAL NEUROPATHY  Complication of diabetes, continue Neurontin. But  will continue 300mg  once a day given worsening renal function and Cr clearance    Hyponatremia  Most likely due to hypovolemia. Continue IV fluids. Stopped diuretics. Sodium improving.  HTN (hypertension)  Continue to monitor closely. Currently stable  Anemia of chronic disease  Hg and Hct stable and at pt's baseline.  Will monitor  Code Status:  Full Code DVT Prophylaxis:   Heparin Family Communication: Discussed with patient  Disposition Plan: not enough support at home; will need SNF at discharge    LOS: 4 days   [name]  [facility] Pager [phone]  01/27/2013, 12:56 PM  If 8PM-8AM, please contact night-coverage at [email or web address], password [facility]

## 2013-01-27 NOTE — Clinical Social Work Psychosocial (Signed)
Clinical Social Work Department  BRIEF PSYCHOSOCIAL ASSESSMENT  Patient:[name] Account Number: [id number]  Admit date: 01/23/13 Clinical Social Worker Sabino Niemann, MSW Date/Time: 01/27/2013 4:23 Referred by: Physician Date Referred: 01/27/2013 Referred for   SNF Placement   Other Referral:  Interview type: Patient  Other interview type: PSYCHOSOCIAL DATA  Living Status: Alone- lacks support Admitted from facility:  Level of care:  Primary support name: [name] Primary support relationship to patient: mother Degree of support available:  Poor support  CURRENT CONCERNS  Current Concerns   Post-Acute Placement   Other Concerns:  SOCIAL WORK ASSESSMENT / PLAN  CSW met with pt re: PT recommendation for SNF.   Pt lives alone  CSW explained placement process and answered questions.   Pt reports no preference at this time   CSW completed FL2 and initiated SNF search.     Assessment/plan status: Information/Referral to [facility]  Other assessment/ plan:  Information/referral to community resources:  SNF   Medicaid transportation  PATIENT'S/FAMILY'S RESPONSE TO PLAN OF CARE:  Pt  reports she is agreeable to ST SNF in order to increase strength and independence with mobility prior to returning home  Pt verbalized understanding of placement process and appreciation for CSW assist.   Sabino Niemann, MSW [phone]

## 2013-01-27 NOTE — Care Management Note (Signed)
CARE MANAGEMENT NOTE 01/27/2013  Patient:  [name], [name]   Account Number:  [id number]  Date Initiated:  01/27/2013  Documentation initiated by:  Vance Peper  Subjective/Objective Assessment:   52 yr old female admitted with right groin abscess. S/p I & D, patient has old right BKA.     Action/Plan:   CM spoke with patient concerning home health versus SNF. Due to patient's poor progress and no support at home she will require SNF. Social worker notified.   Anticipated DC Date:  01/28/2013   Anticipated DC Plan:  SKILLED NURSING FACILITY  In-house referral  Clinical Social Worker      DC Planning Services  CM consult      Choice offered to / List presented to:             Status of service:  Completed, signed off Medicare Important Message given?   (If response is "NO", the following Medicare IM given date fields will be blank) Date Medicare IM given:   Date Additional Medicare IM given:    Discharge Disposition:  SKILLED NURSING FACILITY  Per UR Regulation:    If discussed at Long Length of Stay Meetings, dates discussed:    Comments:

## 2013-01-27 NOTE — Clinical Social Work Placement (Addendum)
Clinical Social Work Department  CLINICAL SOCIAL WORK PLACEMENT NOTE  01/27/2013  Patient: [name] Account Number: [id number]  Admit date: 01/23/13  Clinical Social Worker: Sabino Niemann MSW Date/time: 01/27/2013 4:30 PM  Clinical Social Work is seeking post-discharge placement for this patient at the following level of care: SKILLED NURSING (*CSW will update this form in Epic as items are completed)  01/27/2013 Patient/family provided with [facility] Department of Clinical Social Work's list of facilities offering this level of care within the geographic area requested by the patient (or if unable, by the patient's family).  01/27/2013 Patient/family informed of their freedom to choose among providers that offer the needed level of care, that participate in Medicare, Medicaid or managed care program needed by the patient, have an available bed and are willing to accept the patient.  01/27/2013 Patient/family informed of [facility]' ownership interest in [facility], as well as of the fact that they are under no obligation to receive care at this facility.  PASARR submitted to EDS on Pre-existing PASARR number received from EDS on  FL2 transmitted to all facilities in geographic area requested by pt/family on 01/27/2013  FL2 transmitted to all facilities within larger geographic area on  Patient informed that his/her managed care company has contracts with or will negotiate with certain facilities, including the following:  Patient/family informed of bed offers received: 01/28/13 Patient chooses bed at [facility] Physician recommends and patient chooses bed at  Patient to be transferred to on 02/07/2013 Patient to be transferred to facility by [facility] The following physician request were entered in Epic:  Additional Comments:   Please Contact Diamantina Monks, RN (Transition Nurse) at [phone] if patient is ready for discharge.

## 2013-01-28 ENCOUNTER — Encounter (HOSPITAL_COMMUNITY)
Admission: EM | Disposition: A | Discharge status: Skilled Nursing Facility | Payer: Self-pay | Source: Home / Self Care | Attending: Internal Medicine

## 2013-01-28 ENCOUNTER — Encounter (HOSPITAL_COMMUNITY): Payer: Self-pay | Admitting: Anesthesiology

## 2013-01-28 ENCOUNTER — Encounter (HOSPITAL_COMMUNITY): Payer: Self-pay | Admitting: Certified Registered Nurse Anesthetist

## 2013-01-28 ENCOUNTER — Inpatient Hospital Stay (HOSPITAL_COMMUNITY): Payer: Medicaid Other | Admitting: Certified Registered Nurse Anesthetist

## 2013-01-28 HISTORY — PX: INGUINAL HERNIA REPAIR: SHX194

## 2013-01-28 LAB — RENAL FUNCTION PANEL
CO2: 25 mEq/L (ref 19–32)
Chloride: 105 mEq/L (ref 96–112)
GFR calc Af Amer: 38 mL/min — ABNORMAL LOW (ref >90)
Glucose, Bld: 90 mg/dL (ref 70–99)
Phosphorus: 4.2 mg/dL (ref 2.3–4.6)
Potassium: 3.8 mEq/L (ref 3.5–5.1)
Sodium: 142 mEq/L (ref 135–145)

## 2013-01-28 LAB — BASIC METABOLIC PANEL
BUN: 40 mg/dL — ABNORMAL HIGH (ref 6–23)
CO2: 22 mEq/L (ref 19–32)
Calcium: 8.8 mg/dL (ref 8.4–10.5)
Chloride: 105 mEq/L (ref 96–112)
Creatinine, Ser: 1.74 mg/dL — ABNORMAL HIGH (ref 0.50–1.10)
Glucose, Bld: 116 mg/dL — ABNORMAL HIGH (ref 70–99)

## 2013-01-28 LAB — CBC
HCT: 27.9 % — ABNORMAL LOW (ref 36.0–46.0)
Hemoglobin: 9.3 g/dL — ABNORMAL LOW (ref 12.0–15.0)
MCV: 83 fL (ref 78.0–100.0)
RBC: 3.36 MIL/uL — ABNORMAL LOW (ref 3.87–5.11)
RDW: 14.3 % (ref 11.5–15.5)
WBC: 9.2 10*3/uL (ref 4.0–10.5)

## 2013-01-28 LAB — GLUCOSE, CAPILLARY
Glucose-Capillary: 140 mg/dL — ABNORMAL HIGH (ref 70–99)
Glucose-Capillary: 195 mg/dL — ABNORMAL HIGH (ref 70–99)

## 2013-01-28 SURGERY — REPAIR, HERNIA, INGUINAL, ADULT
Anesthesia: General | Site: Groin | Wound class: Contaminated

## 2013-01-28 MED ORDER — ONDANSETRON HCL 4 MG/2ML IJ SOLN: 4.0000 mg | Freq: Once | INTRAMUSCULAR | Status: DC | PRN

## 2013-01-28 MED ORDER — HYDROMORPHONE HCL PF 1 MG/ML IJ SOLN
INTRAMUSCULAR | Status: AC
  Filled 2013-01-28: qty 1

## 2013-01-28 MED ORDER — LIDOCAINE HCL (CARDIAC) 20 MG/ML IV SOLN
INTRAVENOUS | Status: DC | PRN
  Administered 2013-01-28: 70 mg via INTRAVENOUS

## 2013-01-28 MED ORDER — FENTANYL CITRATE 0.05 MG/ML IJ SOLN
INTRAMUSCULAR | Status: DC | PRN
  Administered 2013-01-28: 50 ug via INTRAVENOUS

## 2013-01-28 MED ORDER — GLUCERNA SHAKE PO LIQD
237.0000 mL | Freq: Two times a day (BID) | ORAL | Status: DC
  Administered 2013-01-29 – 2013-02-07 (×16): 237 mL via ORAL

## 2013-01-28 MED ORDER — HYDROMORPHONE HCL PF 1 MG/ML IJ SOLN
0.2500 mg | INTRAMUSCULAR | Status: DC | PRN
  Administered 2013-01-28 (×2): 0.5 mg via INTRAVENOUS

## 2013-01-28 MED ORDER — BUPIVACAINE-EPINEPHRINE 0.5% -1:200000 IJ SOLN
INTRAMUSCULAR | Status: DC | PRN
  Administered 2013-01-28: 20 mL

## 2013-01-28 MED ORDER — LACTATED RINGERS IV SOLN
INTRAVENOUS | Status: DC
  Administered 2013-01-28: 10:00:00 via INTRAVENOUS

## 2013-01-28 MED ORDER — LACTATED RINGERS IV SOLN
INTRAVENOUS | Status: DC | PRN
  Administered 2013-01-28: 11:00:00 via INTRAVENOUS

## 2013-01-28 MED ORDER — BUPIVACAINE-EPINEPHRINE PF 0.5-1:200000 % IJ SOLN
INTRAMUSCULAR | Status: AC
  Filled 2013-01-28: qty 30

## 2013-01-28 MED ORDER — 0.9 % SODIUM CHLORIDE (POUR BTL) OPTIME
TOPICAL | Status: DC | PRN
  Administered 2013-01-28: 1000 mL

## 2013-01-28 MED ORDER — PROPOFOL 10 MG/ML IV BOLUS
INTRAVENOUS | Status: DC | PRN
  Administered 2013-01-28 (×2): 50 mg via INTRAVENOUS
  Administered 2013-01-28: 100 mg via INTRAVENOUS

## 2013-01-28 SURGICAL SUPPLY — 42 items
BANDAGE GAUZE ELAST BULKY 4 IN (GAUZE/BANDAGES/DRESSINGS) ×2 IMPLANT
BENZOIN TINCTURE PRP APPL 2/3 (GAUZE/BANDAGES/DRESSINGS) ×2 IMPLANT
BLADE SURG 10 STRL SS (BLADE) ×2 IMPLANT
BLADE SURG 15 STRL LF DISP TIS (BLADE) ×1 IMPLANT
BLADE SURG 15 STRL SS (BLADE) ×1
BLADE SURG ROTATE 9660 (MISCELLANEOUS) IMPLANT
CHLORAPREP W/TINT 26ML (MISCELLANEOUS) IMPLANT
CLOTH BEACON ORANGE TIMEOUT ST (SAFETY) ×2 IMPLANT
COVER SURGICAL LIGHT HANDLE (MISCELLANEOUS) ×2 IMPLANT
DRAIN PENROSE 1/2X12 LTX STRL (WOUND CARE) IMPLANT
DRAPE LAPAROTOMY TRNSV 102X78 (DRAPE) ×2 IMPLANT
DRESSING TELFA 8X3 (GAUZE/BANDAGES/DRESSINGS) ×2 IMPLANT
DRSG PAD ABDOMINAL 8X10 ST (GAUZE/BANDAGES/DRESSINGS) ×2 IMPLANT
DRSG TEGADERM 4X4.75 (GAUZE/BANDAGES/DRESSINGS) ×2 IMPLANT
ELECT CAUTERY BLADE 6.4 (BLADE) ×2 IMPLANT
ELECT REM PT RETURN 9FT ADLT (ELECTROSURGICAL) ×2
ELECTRODE REM PT RTRN 9FT ADLT (ELECTROSURGICAL) ×1 IMPLANT
GLOVE SURG SIGNA 7.5 PF LTX (GLOVE) ×2 IMPLANT
GOWN PREVENTION PLUS XLARGE (GOWN DISPOSABLE) ×2 IMPLANT
GOWN STRL NON-REIN LRG LVL3 (GOWN DISPOSABLE) ×4 IMPLANT
KIT BASIN OR (CUSTOM PROCEDURE TRAY) ×2 IMPLANT
KIT ROOM TURNOVER OR (KITS) ×2 IMPLANT
NEEDLE 22X1 1/2 (OR ONLY) (NEEDLE) ×2 IMPLANT
NEEDLE HYPO 25GX1X1/2 BEV (NEEDLE) ×2 IMPLANT
NS IRRIG 1000ML POUR BTL (IV SOLUTION) ×2 IMPLANT
PACK SURGICAL SETUP 50X90 (CUSTOM PROCEDURE TRAY) ×2 IMPLANT
PAD ARMBOARD 7.5X6 YLW CONV (MISCELLANEOUS) ×4 IMPLANT
PENCIL BUTTON HOLSTER BLD 10FT (ELECTRODE) ×2 IMPLANT
SPECIMEN JAR SMALL (MISCELLANEOUS) IMPLANT
SPONGE GAUZE 4X4 12PLY (GAUZE/BANDAGES/DRESSINGS) ×2 IMPLANT
SPONGE LAP 18X18 X RAY DECT (DISPOSABLE) ×2 IMPLANT
STRIP CLOSURE SKIN 1/2X4 (GAUZE/BANDAGES/DRESSINGS) ×2 IMPLANT
SUT MON AB 4-0 PC3 18 (SUTURE) ×2 IMPLANT
SUT SILK 2 0 SH (SUTURE) IMPLANT
SUT VIC AB 2-0 CT1 27 (SUTURE) ×2
SUT VIC AB 2-0 CT1 TAPERPNT 27 (SUTURE) ×2 IMPLANT
SUT VIC AB 3-0 CT1 27 (SUTURE) ×1
SUT VIC AB 3-0 CT1 TAPERPNT 27 (SUTURE) ×1 IMPLANT
SYR CONTROL 10ML LL (SYRINGE) ×4 IMPLANT
TAPE CLOTH SURG 4X10 WHT LF (GAUZE/BANDAGES/DRESSINGS) ×2 IMPLANT
TOWEL OR 17X24 6PK STRL BLUE (TOWEL DISPOSABLE) ×2 IMPLANT
TOWEL OR 17X26 10 PK STRL BLUE (TOWEL DISPOSABLE) ×2 IMPLANT

## 2013-01-28 NOTE — Anesthesia Postprocedure Evaluation (Signed)
  Anesthesia Post-op Note  Patient: [name]  Procedure(s) Performed: Procedure(s): I&D Right Groin Wound (N/A)  Patient Location: PACU  Anesthesia Type:General  Level of Consciousness: awake, oriented, sedated and patient cooperative  Airway and Oxygen Therapy: Patient Spontanous Breathing  Post-op Pain: mild  Post-op Assessment: Post-op Vital signs reviewed, Patient's Cardiovascular Status Stable, Respiratory Function Stable, Patent Airway, No signs of Nausea or vomiting and Pain level controlled  Post-op Vital Signs: stable  Complications: No apparent anesthesia complications

## 2013-01-28 NOTE — Anesthesia Preprocedure Evaluation (Addendum)
Anesthesia Evaluation  Patient identified by MRN, date of birth, ID band Patient awake    Reviewed: Allergy & Precautions, H&P , NPO status , Patient's Chart, lab work & pertinent test results  Airway       Dental   Pulmonary          Cardiovascular hypertension, + Valvular Problems/Murmurs     Neuro/Psych Depression  Neuromuscular disease    GI/Hepatic   Endo/Other  diabetes, Type 2, Insulin DependentHypothyroidism   Renal/GU Renal disease     Musculoskeletal   Abdominal   Peds  Hematology   Anesthesia Other Findings   Reproductive/Obstetrics                          Anesthesia Physical Anesthesia Plan  ASA: II  Anesthesia Plan: General   Post-op Pain Management:    Induction: Intravenous  Airway Management Planned: LMA and Oral ETT  Additional Equipment:   Intra-op Plan:   Post-operative Plan: Extubation in OR  Informed Consent: I have reviewed the patients History and Physical, chart, labs and discussed the procedure including the risks, benefits and alternatives for the proposed anesthesia with the patient or authorized representative who has indicated his/her understanding and acceptance.     Plan Discussed with: CRNA, Anesthesiologist and Surgeon  Anesthesia Plan Comments:         Anesthesia Quick Evaluation

## 2013-01-28 NOTE — Progress Notes (Signed)
[facility] ROUNDING NOTE   Subjective:   Interval History: none   Objective:  Vital signs in last 24 hours:  Temp:  [98 F (36.7 C)-98.4 F (36.9 C)] 98.1 F (36.7 C) (06/10 0554) Pulse Rate:  [64-70] 64 (06/10 0554) Resp:  [16-18] 18 (06/10 0554) BP: (98-150)/(65-73) 98/73 mmHg (06/10 0554) SpO2:  [92 %-98 %] 98 % (06/10 0554) Weight:  [104.736 kg (230 lb 14.4 oz)-105.053 kg (231 lb 9.6 oz)] 104.736 kg (230 lb 14.4 oz) (06/10 0554)  Weight change: -0.318 kg (-11.2 oz) Filed Weights   01/26/13 1500 01/28/13 0500 01/28/13 0554  Weight: 105.371 kg (232 lb 4.8 oz) 105.053 kg (231 lb 9.6 oz) 104.736 kg (230 lb 14.4 oz)    Intake/Output: I/O last 3 completed shifts: In: 1900 [P.O.:1600; I.V.:300] Out: 2902 [Urine:2900; Stool:2]   Intake/Output this shift:     CVS- RRR RS- CTA ABD- BS present soft non-distended EXT- no edema   Basic Metabolic Panel:  Recent Labs Lab 01/23/13 2258 01/24/13 0555 01/25/13 0455 01/26/13 0423 01/27/13 0405 01/28/13 0445  NA 131* 129* 129* 132* 134* 137  K 3.5 3.7 4.2 4.5 4.8 5.4*  CL 90* 90* 90* 96 100 105  CO2 27 24 26 23 22 22   GLUCOSE 316* 355* 346* 252* 329* 116*  BUN 34* 37* 43* 57* 49* 40*  CREATININE 0.93 1.21* 3.48* 4.74* 2.75* 1.74*  CALCIUM 10.0 9.3 9.0 8.9 8.8 8.8  MG 1.8  --   --   --   --   --   PHOS 4.2  --   --   --  5.0*  --     Liver Function Tests:  Recent Labs Lab 01/23/13 2258 01/24/13 0555 01/27/13 0405  AST 11 11  --   ALT 11 11  --   ALKPHOS 140* 155*  --   BILITOT 0.2* 0.1*  --   PROT 7.3 6.8  --   ALBUMIN 2.0* 1.6* 1.9*   No results found for this basename: LIPASE, AMYLASE,  in the last 168 hours No results found for this basename: AMMONIA,  in the last 168 hours  CBC:  Recent Labs Lab 01/23/13 1903 01/23/13 2258 01/24/13 0555 01/25/13 0455 01/26/13 0423 01/28/13 0745  WBC 11.0* 12.4* 10.5 10.7* 8.8 9.2  NEUTROABS 7.1 8.1*  --   --   --   --   HGB 11.3* 11.9*  11.2* 10.4* 9.8* 9.3*  HCT 33.4* 33.8* 32.5* 30.8* 29.2* 27.9*  MCV 82.7 81.6 82.1 83.0 83.0 83.0  PLT 309 334 308 299 267 266    Cardiac Enzymes: No results found for this basename: CKTOTAL, CKMB, CKMBINDEX, TROPONINI,  in the last 168 hours  BNP: No components found with this basename: POCBNP,   CBG:  Recent Labs Lab 01/27/13 0626 01/27/13 1139 01/27/13 1654 01/27/13 2203 01/28/13 0721  GLUCAP 324* 334* 138* 146* 98    Microbiology: Results for orders placed during the hospital encounter of 01/23/13  SURGICAL PCR SCREEN     Status: Abnormal   Collection Time    01/24/13  5:54 AM      Result Value Range Status   MRSA, PCR NEGATIVE  NEGATIVE Final   Staphylococcus aureus POSITIVE (*) NEGATIVE Final   Comment:            The Xpert SA Assay (FDA     approved for NASAL specimens     in patients over 72 years of age),     is one  component of     a comprehensive surveillance     program.  Test performance has     been validated by [facility] for patients greater     than or equal to 63 year old.     It is not intended     to diagnose infection nor to     guide or monitor treatment.  CULTURE, ROUTINE-ABSCESS     Status: None   Collection Time    01/27/13  3:50 PM      Result Value Range Status   Specimen Description ABSCESS GROIN RIGHT   Final   Special Requests Normal   Final   Gram Stain     Final   Value: FEW WBC PRESENT,BOTH PMN AND MONONUCLEAR     NO SQUAMOUS EPITHELIAL CELLS SEEN     NO ORGANISMS SEEN   Culture PENDING   Incomplete   Report Status PENDING   Incomplete    Coagulation Studies: No results found for this basename: LABPROT, INR,  in the last 72 hours  Urinalysis:  Recent Labs  01/25/13 0919 01/26/13 1728  COLORURINE YELLOW YELLOW  LABSPEC 1.019 1.016  PHURINE 5.0 5.0  GLUCOSEU >1000* 250*  HGBUR MODERATE* MODERATE*  BILIRUBINUR NEGATIVE NEGATIVE  KETONESUR NEGATIVE NEGATIVE  PROTEINUR >300* 100*  UROBILINOGEN 0.2 1.0   NITRITE NEGATIVE NEGATIVE  LEUKOCYTESUR NEGATIVE NEGATIVE      Imaging: No results found.   Medications:   . sodium chloride 100 mL/hr at 01/28/13 0039   . atorvastatin  80 mg Oral q1800  . doxycycline  100 mg Oral Q12H  . ertapenem  500 mg Intravenous Q24H  . FLUoxetine  20 mg Oral Daily  . gabapentin  300 mg Oral Daily  . heparin subcutaneous  5,000 Units Subcutaneous Q8H  . insulin aspart  0-20 Units Subcutaneous TID WC  . insulin aspart  0-5 Units Subcutaneous QHS  . insulin detemir  70 Units Subcutaneous BID  . levothyroxine  25 mcg Oral QAC breakfast  . metoCLOPramide  5 mg Oral TID WC  . oxybutynin  5 mg Oral Daily  . pantoprazole  40 mg Oral Daily   acetaminophen, acetaminophen, hydrALAZINE, morphine injection, ondansetron (ZOFRAN) IV, ondansetron, oxyCODONE-acetaminophen  Assessment/ Plan:  The patient is a 52 y.o. year-old with hx of DM2, HL, HTN (off meds), cellulitis and depression presented on 6/5 with draining abcess of R groin. She had abcess drained 5d PTA and had been started on po abx but the area continued to drain.   Acute kidney injury- in patient with probable underlying diabetic nephropathy (proteinuria) who received IV contrast on 6/5 and IV Vanc 6/5 thru 6/7. Doubt vanc is the cause, suspect contrast nephropathy. Looks euvolemic and without uremic symptoms, making lots of urine.  Creatinine improved today  Potassium increased slightly  Recommend recheck   LOS: 5 [name] W @TODAY @8 :58 AM

## 2013-01-28 NOTE — Progress Notes (Signed)
4 Days Post-Op  Subjective: comfortable  Objective: Vital signs in last 24 hours: Temp:  [98 F (36.7 C)-98.4 F (36.9 C)] 98.1 F (36.7 C) (06/10 0554) Pulse Rate:  [64-70] 64 (06/10 0554) Resp:  [16-18] 18 (06/10 0554) BP: (98-150)/(65-73) 98/73 mmHg (06/10 0554) SpO2:  [92 %-98 %] 98 % (06/10 0554) Weight:  [230 lb 14.4 oz (104.736 kg)-231 lb 9.6 oz (105.053 kg)] 230 lb 14.4 oz (104.736 kg) (06/10 0554) Last BM Date: 01/27/13  Intake/Output from previous day: 06/09 0701 - 06/10 0700 In: 1900 [P.O.:1600; I.V.:300] Out: 2102 [Urine:2100; Stool:2] Intake/Output this shift:    Packing not in wound. Still with moderate purulence  Lab Results:   Recent Labs  01/26/13 0423 01/28/13 0745  WBC 8.8 9.2  HGB 9.8* 9.3*  HCT 29.2* 27.9*  PLT 267 266   BMET  Recent Labs  01/27/13 0405 01/28/13 0445  NA 134* 137  K 4.8 5.4*  CL 100 105  CO2 22 22  GLUCOSE 329* 116*  BUN 49* 40*  CREATININE 2.75* 1.74*  CALCIUM 8.8 8.8   PT/INR No results found for this basename: LABPROT, INR,  in the last 72 hours ABG No results found for this basename: PHART, PCO2, PO2, HCO3,  in the last 72 hours  Studies/Results: No results found.  Anti-infectives: Anti-infectives   Start     Dose/Rate Route Frequency Ordered Stop   01/26/13 1530  doxycycline (VIBRA-TABS) tablet 100 mg     100 mg Oral Every 12 hours 01/26/13 1427     01/25/13 2300  ertapenem (INVANZ) 0.5 g in sodium chloride 0.9 % 50 mL IVPB     500 mg 100 mL/hr over 30 Minutes Intravenous Every 24 hours 01/25/13 1906     01/25/13 0000  ertapenem (INVANZ) 1 g in sodium chloride 0.9 % 50 mL IVPB  Status:  Discontinued     1 g 100 mL/hr over 30 Minutes Intravenous Every 24 hours 01/23/13 2307 01/25/13 1902   01/24/13 1000  vancomycin (VANCOCIN) IVPB 1000 mg/200 mL premix  Status:  Discontinued     1,000 mg 200 mL/hr over 60 Minutes Intravenous Every 12 hours 01/23/13 2309 01/25/13 1859   01/23/13 2315  vancomycin  (VANCOCIN) IVPB 1000 mg/200 mL premix     1,000 mg 200 mL/hr over 60 Minutes Intravenous  Once 01/23/13 2309 01/24/13 0314   01/23/13 2300  ertapenem (INVANZ) 1 g in sodium chloride 0.9 % 50 mL IVPB     1 g 100 mL/hr over 30 Minutes Intravenous  Once 01/23/13 2252 01/24/13 0053   01/23/13 2200  vancomycin (VANCOCIN) IVPB 1000 mg/200 mL premix     1,000 mg 200 mL/hr over 60 Minutes Intravenous  Once 01/23/13 2146 01/23/13 2311      Assessment/Plan: s/p Procedure(s): INCISION AND DRAINAGE ABSCESS (N/A)  Will return to OR today to wash out right groin wound.  Risks discussed with patient.  She agrees to proceed.  LOS: 5 days    [name] A 01/28/2013

## 2013-01-28 NOTE — Op Note (Signed)
I&D Right Groin Wound  Procedure Note  [name] 01/23/2013 - 01/28/2013   Pre-op Diagnosis: Right Groin Infection     Post-op Diagnosis: same  Procedure(s): I&D Right Groin Wound  Surgeon(s): Shelly Rubenstein, MD  Anesthesia: General  Staff:  Circulator: Rolan Lipa, RN Scrub Person: Sudie Bailey, RN  Estimated Blood Loss: Minimal                         [name]   Date: 01/28/2013  Time: 11:25 AM

## 2013-01-28 NOTE — Preoperative (Signed)
Beta Blockers   Reason not to administer Beta Blockers:Not Applicable 

## 2013-01-28 NOTE — Transfer of Care (Signed)
Immediate Anesthesia Transfer of Care Note  Patient: [name]  Procedure(s) Performed: Procedure(s): I&D Right Groin Wound (N/A)  Patient Location: PACU  Anesthesia Type:General  Level of Consciousness: awake, alert  and oriented  Airway & Oxygen Therapy: Patient Spontanous Breathing and Patient connected to face mask oxygen  Post-op Assessment: Report given to PACU RN, Post -op Vital signs reviewed and stable and Patient moving all extremities  Post vital signs: Reviewed and stable  Complications: No apparent anesthesia complications

## 2013-01-28 NOTE — Progress Notes (Signed)
[facility] PROGRESS NOTE  LA SHEHAN ZOX:[id number] DOB: May 18, 1961 DOA: 01/23/2013  PCP: [name], MD Endocrinologist: Dr. Sharl Ma  Past medical history:  Past Medical History  Diagnosis Date  . Diabetic neuropathy   . Cellulitis   . Thyroid disease   . Hyperlipidemia     not on meds  . Heart murmur   . Arthritis   . Neuromuscular disorder   . Hypertension     hx of;but doesn't require meds anymore;has been off of x 58yr  . Pneumonia     hx of;>51yr ago   . Concussion as a teenager    slight  . Diarrhea     pt takes Reglan tid  . History of kidney stones   . Hypothyroidism     takes Synthroid daily  . Diabetes mellitus     Novolog,Levemir,and Lovaza daily  . Depression     takes CYmbalta daily  . Peripheral edema     takes Lasix daily  . MRSA (methicillin resistant staph aureus) culture positive     hx of 2012  . Staph infection 2007    Consultants: Gen Surgery  Procedures: Surgical I&D 6/6.  Antibiotics: IV Vanc 6/5-->6/8 IV Ertapenem 6/5--> Doxycycline 6/8  Subjective: Patient AAOX3; afebrile and complaining of pain in her right groin  Objective: Vital Signs  Filed Vitals:   01/28/13 1215 01/28/13 1225 01/28/13 1230 01/28/13 1235  BP: 174/87 154/58 174/68   Pulse: 64 64 65 66  Temp:      TempSrc:      Resp: 9 14 11 12   Height:      Weight:      SpO2: 95% 93% 93% 93%    Intake/Output Summary (Last 24 hours) at 01/28/13 1249 Last data filed at 01/28/13 1207  Gross per 24 hour  Intake   1820 ml  Output   3002 ml  Net  -1182 ml   Filed Weights   01/26/13 1500 01/28/13 0500 01/28/13 0554  Weight: 105.371 kg (232 lb 4.8 oz) 105.053 kg (231 lb 9.6 oz) 104.736 kg (230 lb 14.4 oz)   General appearance: cooperative, no distress and moderately obese Head: Normocephalic, without obvious abnormality, atraumatic Throat: Dry Mucus membranes Resp: clear to auscultation bilaterally Cardio: regular rate and rhythm, S1, S2 normal.  Systolic murmur in mitral area (old per patient). no click, rub or gallop GI: soft, non-tender; bowel sounds normal; no masses,  no organomegaly Extremities: Right groin area covered with dressing. She is s/p BKA on right. Pulses: 2+ and symmetric. Plan is for surgery today, tender on palpation Skin: erythema in righ groin area; wound with clean dressings Lymph nodes: Inguinal adenopathy: palpable in right groin Neurologic: Somnolent but arousable. Oriented x 3. No focal deficits.  Lab Results:  Basic Metabolic Panel:  Recent Labs Lab 01/23/13 2258  01/25/13 0455 01/26/13 0423 01/27/13 0405 01/28/13 0445 01/28/13 0908  NA 131*  < > 129* 132* 134* 137 142  K 3.5  < > 4.2 4.5 4.8 5.4* 3.8  CL 90*  < > 90* 96 100 105 105  CO2 27  < > 26 23 22 22 25   GLUCOSE 316*  < > 346* 252* 329* 116* 90  BUN 34*  < > 43* 57* 49* 40* 38*  CREATININE 0.93  < > 3.48* 4.74* 2.75* 1.74* 1.73*  CALCIUM 10.0  < > 9.0 8.9 8.8 8.8 8.8  MG 1.8  --   --   --   --   --   --  PHOS 4.2  --   --   --  5.0*  --  4.2  < > = values in this interval not displayed. Liver Function Tests:  Recent Labs Lab 01/23/13 2258 01/24/13 0555 01/27/13 0405 01/28/13 0908  AST 11 11  --   --   ALT 11 11  --   --   ALKPHOS 140* 155*  --   --   BILITOT 0.2* 0.1*  --   --   PROT 7.3 6.8  --   --   ALBUMIN 2.0* 1.6* 1.9* 1.9*   CBC:  Recent Labs Lab 01/23/13 1903 01/23/13 2258 01/24/13 0555 01/25/13 0455 01/26/13 0423 01/28/13 0745  WBC 11.0* 12.4* 10.5 10.7* 8.8 9.2  NEUTROABS 7.1 8.1*  --   --   --   --   HGB 11.3* 11.9* 11.2* 10.4* 9.8* 9.3*  HCT 33.4* 33.8* 32.5* 30.8* 29.2* 27.9*  MCV 82.7 81.6 82.1 83.0 83.0 83.0  PLT 309 334 308 299 267 266   CBG:  Recent Labs Lab 01/27/13 1654 01/27/13 2203 01/28/13 0721 01/28/13 0947 01/28/13 1155  GLUCAP 138* 146* 98 85 115*    Recent Results (from the past 240 hour(s))  SURGICAL PCR SCREEN     Status: Abnormal   Collection Time    01/24/13  5:54 AM       Result Value Range Status   MRSA, PCR NEGATIVE  NEGATIVE Final   Staphylococcus aureus POSITIVE (*) NEGATIVE Final   Comment:            The Xpert SA Assay (FDA     approved for NASAL specimens     in patients over 93 years of age),     is one component of     a comprehensive surveillance     program.  Test performance has     been validated by [employer] for patients greater     than or equal to 67 year old.     It is not intended     to diagnose infection nor to     guide or monitor treatment.  CULTURE, ROUTINE-ABSCESS     Status: None   Collection Time    01/27/13  3:50 PM      Result Value Range Status   Specimen Description ABSCESS GROIN RIGHT   Final   Special Requests Normal   Final   Gram Stain     Final   Value: FEW WBC PRESENT,BOTH PMN AND MONONUCLEAR     NO SQUAMOUS EPITHELIAL CELLS SEEN     NO ORGANISMS SEEN   Culture NO GROWTH   Final   Report Status PENDING   Incomplete      Studies/Results: No results found.  Medications:  Scheduled: .  [facility] HOLD] atorvastatin  80 mg Oral q1800  . [facility] HOLD] doxycycline  100 mg Oral Q12H  . [facility] HOLD] ertapenem  500 mg Intravenous Q24H  . [[facility] HOLD] FLUoxetine  20 mg Oral Daily  . [facility] HOLD] gabapentin  300 mg Oral Daily  . [facility] HOLD] heparin subcutaneous  5,000 Units Subcutaneous Q8H  . HYDROmorphone      . [[facility] HOLD] insulin aspart  0-20 Units Subcutaneous TID WC  . [[facility] HOLD] insulin aspart  0-5 Units Subcutaneous QHS  . [[facility] HOLD] insulin detemir  70 Units Subcutaneous BID  . [facility] HOLD] levothyroxine  25 mcg Oral QAC breakfast  . [[facility] HOLD] metoCLOPramide  5 mg Oral TID WC  . [  [facility] HOLD] oxybutynin  5 mg Oral Daily  . [facility] HOLD] pantoprazole  40 mg Oral Daily   Continuous: . sodium chloride 100 mL/hr at 01/28/13 0039  . lactated ringers 50 mL/hr at 01/28/13 0953   PRN:[[facility] HOLD] acetaminophen, [[facility] HOLD] acetaminophen, [[facility] HOLD] hydrALAZINE, HYDROmorphone (DILAUDID) injection, [[facility] HOLD]  morphine  injection, [[facility] HOLD] ondansetron (ZOFRAN) IV, ondansetron (ZOFRAN) IV, [[facility] HOLD] ondansetron, [[facility] HOLD] oxyCODONE-acetaminophen  Assessment/Plan:  Principal Problem:   Abscess of groin, right Active Problems:   Diabetes mellitus type 2, uncontrolled, with complications   DEPRESSION   PERIPHERAL NEUROPATHY   HYPERCHOLESTEROLEMIA   Hyponatremia   HTN (hypertension)   Leukocytosis   Anemia of chronic disease   Hypothyroidism   ARF (acute renal failure)   Proteinuria   Abscess of groin, right  Status post I&D 6/6. Continue Vanc and Ertapenem per pharmacy. Blood cultures from 5/31 shows no growth. Strep noted from abscess. -vanc discontinue given renal failure -continue doxycycline and invanz -non toxic in appearance -surgery took her again to OR on 6/10 for further I/D/W  ARF Creatinine has risen significantly. Most likely due to ATN; was also on nephrotoxic agents (HCTZ and vanc) and was exposed to IV contrast on 6/5 -holding nephrotoxic agents -foley place to follow urine output closely -renal US essentially normal -UA no suggesting UTI -Cr improved to 1.7 today -will continue IVF's; but will change to 75 cc/hr -will follow renal rec's. -normal WBC's and no fever  Uncontrolled DIABETES MELLITUS, TYPE II  Poorly controlled.  -HBA1C is 12.9. -will continue levemir and SSI, further adjustments base on CBG's levels.   DEPRESSION  Appears stable at this time, normal affect -Continue Fluoxetine.  PERIPHERAL NEUROPATHY  Complication of diabetes, continue Neurontin. But will continue 300mg  once a day given worsening renal function and Cr clearance    Hyponatremia  Most likely due to hypovolemia. Continue IV fluids. Stopped diuretics. Sodium improving.  HTN (hypertension)  Continue to monitor closely. Currently stable  Anemia of chronic disease  Hg and Hct stable and at pt's baseline.  Will monitor  Mild hyperkalemia: -will repeat BMET -K 5.4; no need for  kayexalate or further intervention at this point.  Code Status:  Full Code DVT Prophylaxis:   Heparin Family Communication: Discussed with patient  Disposition Plan: not enough support at home; will need SNF at discharge    LOS: 5 days   Magdiel Bartles  [facility] Pager [phone]  01/28/2013, 12:49 PM  If 8PM-8AM, please contact night-coverage at [email or web address], password [facility]

## 2013-01-28 NOTE — Op Note (Signed)
[name], [name] NO.:  [id number]  MEDICAL RECORD NO.:  [id number]  LOCATION:  5N15C                        FACILITY:  [facility]  PHYSICIAN:  Abigail Miyamoto, M.D. DATE OF BIRTH:  Dec 27, 1960  DATE OF PROCEDURE:  01/28/2013 DATE OF DISCHARGE:                              OPERATIVE REPORT   PREOPERATIVE DIAGNOSIS:  Right groin wound.  POSTOP DIAGNOSIS:  Right groin wound.  PROCEDURES:  Irrigation and debridement of the right groin wound.  SURGEON:  Abigail Miyamoto, M.D.  ANESTHESIA:  General and 0.5% Marcaine.  ESTIMATED BLOOD LOSS:  Minimal.  INDICATIONS:  This 52 year old female who had undergone incision and drainage of a right groin infection and abscess on January 24, 2013. Because of continued purulence in the wound, decision was made to proceed to the operating room for washout.  FINDINGS:  The patient was found to have extension of the purulence of the fascial plane inferiorly.  There was some mild necrotic debris, which was sharply debrided.  PROCEDURE IN DETAIL:  The patient was brought to the operative room, identified as [name].  She is placed supine on the operating room table.  General anesthesia was induced.  Her right groin wound was then unpacked and then prepped and draped in usual sterile fashion.  There was some mild fibrinous exudate in the wound, which I excised with the cautery.  When I probed the wound further, there was extension of purulence above the fascia going down to the thigh.  I then made the incision larger and dissect down in this area to drain all the purulence.  There was no necrotic muscle involved.  I then anesthetized the wound thoroughly with Marcaine.  I irrigated with a large amount of normal saline.  I then packed it with a wet-to-dry Kerlix gauze.  The patient tolerated the procedure well.  All the counts were correct at the end of the procedure.  The patient was then taken in stable condition from the  operating room to the recovery room.     Abigail Miyamoto, M.D.    DB/MEDQ  D:  01/28/2013  T:  01/28/2013  Job:  [id number]

## 2013-01-28 NOTE — Anesthesia Procedure Notes (Signed)
Procedure Name: LMA Insertion Date/Time: 01/28/2013 11:09 AM Performed by: Margaree Mackintosh Pre-anesthesia Checklist: Patient identified, Emergency Drugs available, Suction available, Patient being monitored and Timeout performed Patient Re-evaluated:Patient Re-evaluated prior to inductionOxygen Delivery Method: Circle system utilized Preoxygenation: Pre-oxygenation with 100% oxygen Intubation Type: IV induction LMA: LMA inserted LMA Size: 4.0 Number of attempts: 1 Placement Confirmation: positive ETCO2 and breath sounds checked- equal and bilateral Tube secured with: Tape Dental Injury: Teeth and Oropharynx as per pre-operative assessment     Procedure Name: Intubation Date/Time: 01/28/2013 11:16 AM Performed by: Margaree Mackintosh Pre-anesthesia Checklist: Patient identified, Emergency Drugs available, Suction available and Patient being monitored Patient Re-evaluated:Patient Re-evaluated prior to inductionPreoxygenation: Pre-oxygenation with 100% oxygen Intubation Type: Combination inhalational/ intravenous induction Ventilation: Oral airway inserted - appropriate to patient size and Two handed mask ventilation required Laryngoscope Size: Mac and 3 Grade View: Grade II Tube type: Oral Tube size: 7.0 mm Number of attempts: 2 Airway Equipment and Method: Stylet Placement Confirmation: ETT inserted through vocal cords under direct vision,  positive ETCO2 and breath sounds checked- equal and bilateral Secured at: 21 cm Tube secured with: Tape Dental Injury: Teeth and Oropharynx as per pre-operative assessment

## 2013-01-28 NOTE — Progress Notes (Signed)
Received report from Teresa RN

## 2013-01-29 DIAGNOSIS — L0291 Cutaneous abscess, unspecified: Secondary | ICD-10-CM

## 2013-01-29 LAB — GLUCOSE, CAPILLARY
Glucose-Capillary: 133 mg/dL — ABNORMAL HIGH (ref 70–99)
Glucose-Capillary: 99 mg/dL (ref 70–99)
Glucose-Capillary: 144 mg/dL — ABNORMAL HIGH (ref 70–99)

## 2013-01-29 LAB — BASIC METABOLIC PANEL
CO2: 24 mEq/L (ref 19–32)
Calcium: 8.4 mg/dL (ref 8.4–10.5)
Glucose, Bld: 150 mg/dL — ABNORMAL HIGH (ref 70–99)
Sodium: 141 mEq/L (ref 135–145)

## 2013-01-29 MED ORDER — MORPHINE SULFATE 2 MG/ML IJ SOLN
2.0000 mg | Freq: Once | INTRAMUSCULAR | Status: DC
  Filled 2013-01-29: qty 1

## 2013-01-29 MED ORDER — AMLODIPINE BESYLATE 5 MG PO TABS
5.0000 mg | ORAL_TABLET | Freq: Every day | ORAL | Status: DC
  Administered 2013-01-29 – 2013-02-07 (×9): 5 mg via ORAL
  Filled 2013-01-29 (×10): qty 1

## 2013-01-29 MED ORDER — HYDROMORPHONE HCL PF 1 MG/ML IJ SOLN
1.0000 mg | INTRAMUSCULAR | Status: DC | PRN
  Administered 2013-01-30 – 2013-02-02 (×9): 1 mg via INTRAVENOUS
  Filled 2013-01-29 (×11): qty 1

## 2013-01-29 MED ORDER — HYDROXYZINE HCL 25 MG PO TABS
25.0000 mg | ORAL_TABLET | ORAL | Status: DC | PRN
  Administered 2013-01-29: 25 mg via ORAL
  Filled 2013-01-29: qty 1

## 2013-01-29 MED ORDER — TRIAMCINOLONE ACETONIDE 0.1 % EX CREA
TOPICAL_CREAM | Freq: Two times a day (BID) | CUTANEOUS | Status: DC
  Administered 2013-01-30 – 2013-02-03 (×5): via TOPICAL
  Administered 2013-02-04: 1 via TOPICAL
  Administered 2013-02-05 – 2013-02-07 (×3): via TOPICAL
  Filled 2013-01-29 (×2): qty 15

## 2013-01-29 MED ORDER — SODIUM CHLORIDE 0.9 % IV SOLN
1.0000 g | INTRAVENOUS | Status: DC
  Administered 2013-01-29: 1 g via INTRAVENOUS
  Filled 2013-01-29 (×2): qty 1

## 2013-01-29 MED ORDER — MORPHINE BOLUS VIA INFUSION: 2.0000 mg | Freq: Once | INTRAVENOUS | Status: DC

## 2013-01-29 MED ORDER — MORPHINE SULFATE 2 MG/ML IJ SOLN
2.0000 mg | INTRAMUSCULAR | Status: AC
  Administered 2013-01-29: 2 mg via INTRAVENOUS

## 2013-01-29 NOTE — Progress Notes (Signed)
1 Day Post-Op  Subjective: Crying can't get up in bed.  Can't even tolerate removal of the tape.  So we are giving her morphine for dressing change.  Objective: Vital signs in last 24 hours: Temp:  [97.4 F (36.3 C)-98.8 F (37.1 C)] 98.1 F (36.7 C) (06/11 0600) Pulse Rate:  [63-70] 66 (06/11 0600) Resp:  [9-18] 16 (06/11 0600) BP: (134-190)/(51-87) 186/72 mmHg (06/11 0600) SpO2:  [92 %-98 %] 95 % (06/11 0600) Weight:  [106.369 kg (234 lb 8 oz)] 106.369 kg (234 lb 8 oz) (06/11 0500) Last BM Date: 01/28/13 720 PO recorded yesterday.  Diet: Carb Modified Afebrile, BP up at times, Renal function improving  Intake/Output from previous day: 06/10 0701 - 06/11 0700 In: 1420 [P.O.:720; I.V.:700] Out: 1450 [Urine:1400; Blood:50] Intake/Output this shift:    General appearance: alert, severe distress and She is having a very hard time just moving in bed.  Dressing change even with Morphine is very painful for her. Skin: The site in the right groin actually looks pretty good.  it had allot op packing and it is very clean.  We have repacked it.  Lab Results:   Recent Labs  01/28/13 0745  WBC 9.2  HGB 9.3*  HCT 27.9*  PLT 266    BMET  Recent Labs  01/28/13 0908 01/29/13 0520  NA 142 141  K 3.8 4.0  CL 105 105  CO2 25 24  GLUCOSE 90 150*  BUN 38* 26*  CREATININE 1.73* 1.37*  CALCIUM 8.8 8.4   PT/INR No results found for this basename: LABPROT, INR,  in the last 72 hours   Recent Labs Lab 01/23/13 2258 01/24/13 0555 01/27/13 0405 01/28/13 0908  AST 11 11  --   --   ALT 11 11  --   --   ALKPHOS 140* 155*  --   --   BILITOT 0.2* 0.1*  --   --   PROT 7.3 6.8  --   --   ALBUMIN 2.0* 1.6* 1.9* 1.9*     Lipase     Component Value Date/Time   LIPASE 38 12/05/2012 0317     Studies/Results: No results found.  Medications: . amLODipine  5 mg Oral Daily  . atorvastatin  80 mg Oral q1800  . doxycycline  100 mg Oral Q12H  . ertapenem  500 mg Intravenous  Q24H  . feeding supplement  237 mL Oral BID BM  . FLUoxetine  20 mg Oral Daily  . gabapentin  300 mg Oral Daily  . heparin subcutaneous  5,000 Units Subcutaneous Q8H  . insulin aspart  0-20 Units Subcutaneous TID WC  . insulin aspart  0-5 Units Subcutaneous QHS  . insulin detemir  70 Units Subcutaneous BID  . levothyroxine  25 mcg Oral QAC breakfast  . metoCLOPramide  5 mg Oral TID WC  . oxybutynin  5 mg Oral Daily  . pantoprazole  40 mg Oral Daily    Assessment/Plan Right groin wound. Irrigation and debridement of the right groin Nichole Campus, MD, 01/28/2013  INCISION AND DRAINAGE ABSCESS right groin abcess, 01/24/2013, Clovis Pu. Cornett, MD.  +MRSA on nasal swab  Wound culture: MODERATE GROUP B STREP(S.AGALACTIAE)ISOLATED No growth on blood cultures  DIABETES MELLITUS, TYPE II  - uncontrolled, with neuropathy  Severe renal insuffiencey (creatinine up to 4.74, yesterday down to 2.75)  DEPRESSION  HTN (hypertension)  Hyponatremia  Anemia of chronic disease   Plan:  Continue dressing changes BID.  She will need premedication  before it is done.  LOS: 6 days    [name] 01/29/2013

## 2013-01-29 NOTE — Progress Notes (Signed)
[facility] ROUNDING NOTE   Subjective:   Interval History:none   Objective:  Vital signs in last 24 hours:  Temp:  [97.4 F (36.3 C)-98.8 F (37.1 C)] 98.1 F (36.7 C) (06/11 0600) Pulse Rate:  [63-70] 66 (06/11 0600) Resp:  [9-18] 16 (06/11 0600) BP: (134-190)/(51-87) 186/72 mmHg (06/11 0600) SpO2:  [92 %-98 %] 95 % (06/11 0600) Weight:  [106.369 kg (234 lb 8 oz)] 106.369 kg (234 lb 8 oz) (06/11 0500)  Weight change: 1.315 kg (2 lb 14.4 oz) Filed Weights   01/28/13 0500 01/28/13 0554 01/29/13 0500  Weight: 105.053 kg (231 lb 9.6 oz) 104.736 kg (230 lb 14.4 oz) 106.369 kg (234 lb 8 oz)    Intake/Output: I/O last 3 completed shifts: In: 1420 [P.O.:720; I.V.:700] Out: 2652 [Urine:2600; Stool:2; Blood:50]   Intake/Output this shift:     CVS- RRR RS- CTA ABD- BS present soft non-distended EXT- no edema   Basic Metabolic Panel:  Recent Labs Lab 01/23/13 2258  01/26/13 0423 01/27/13 0405 01/28/13 0445 01/28/13 0908 01/29/13 0520  NA 131*  < > 132* 134* 137 142 141  K 3.5  < > 4.5 4.8 5.4* 3.8 4.0  CL 90*  < > 96 100 105 105 105  CO2 27  < > 23 22 22 25 24   GLUCOSE 316*  < > 252* 329* 116* 90 150*  BUN 34*  < > 57* 49* 40* 38* 26*  CREATININE 0.93  < > 4.74* 2.75* 1.74* 1.73* 1.37*  CALCIUM 10.0  < > 8.9 8.8 8.8 8.8 8.4  MG 1.8  --   --   --   --   --   --   PHOS 4.2  --   --  5.0*  --  4.2  --   < > = values in this interval not displayed.  Liver Function Tests:  Recent Labs Lab 01/23/13 2258 01/24/13 0555 01/27/13 0405 01/28/13 0908  AST 11 11  --   --   ALT 11 11  --   --   ALKPHOS 140* 155*  --   --   BILITOT 0.2* 0.1*  --   --   PROT 7.3 6.8  --   --   ALBUMIN 2.0* 1.6* 1.9* 1.9*   No results found for this basename: LIPASE, AMYLASE,  in the last 168 hours No results found for this basename: AMMONIA,  in the last 168 hours  CBC:  Recent Labs Lab 01/23/13 1903 01/23/13 2258 01/24/13 0555 01/25/13 0455 01/26/13 0423  01/28/13 0745  WBC 11.0* 12.4* 10.5 10.7* 8.8 9.2  NEUTROABS 7.1 8.1*  --   --   --   --   HGB 11.3* 11.9* 11.2* 10.4* 9.8* 9.3*  HCT 33.4* 33.8* 32.5* 30.8* 29.2* 27.9*  MCV 82.7 81.6 82.1 83.0 83.0 83.0  PLT 309 334 308 299 267 266    Cardiac Enzymes: No results found for this basename: CKTOTAL, CKMB, CKMBINDEX, TROPONINI,  in the last 168 hours  BNP: No components found with this basename: POCBNP,   CBG:  Recent Labs Lab 01/28/13 1316 01/28/13 1648 01/28/13 1808 01/28/13 2117 01/29/13 0643  GLUCAP 87 68* 140* 195* 133*    Microbiology: Results for orders placed during the hospital encounter of 01/23/13  SURGICAL PCR SCREEN     Status: Abnormal   Collection Time    01/24/13  5:54 AM      Result Value Range Status   MRSA, PCR NEGATIVE  NEGATIVE Final  Staphylococcus aureus POSITIVE (*) NEGATIVE Final   Comment:            The Xpert SA Assay (FDA     approved for NASAL specimens     in patients over 41 years of age),     is one component of     a comprehensive surveillance     program.  Test performance has     been validated by [employer] for patients greater     than or equal to 45 year old.     It is not intended     to diagnose infection nor to     guide or monitor treatment.  CULTURE, ROUTINE-ABSCESS     Status: None   Collection Time    01/27/13  3:50 PM      Result Value Range Status   Specimen Description ABSCESS GROIN RIGHT   Final   Special Requests Normal   Final   Gram Stain     Final   Value: FEW WBC PRESENT,BOTH PMN AND MONONUCLEAR     NO SQUAMOUS EPITHELIAL CELLS SEEN     NO ORGANISMS SEEN   Culture NO GROWTH   Final   Report Status PENDING   Incomplete    Coagulation Studies: No results found for this basename: LABPROT, INR,  in the last 72 hours  Urinalysis:  Recent Labs  01/26/13 1728  COLORURINE YELLOW  LABSPEC 1.016  PHURINE 5.0  GLUCOSEU 250*  HGBUR MODERATE*  BILIRUBINUR NEGATIVE  KETONESUR NEGATIVE  PROTEINUR  100*  UROBILINOGEN 1.0  NITRITE NEGATIVE  LEUKOCYTESUR NEGATIVE      Imaging: No results found.   Medications:   . sodium chloride 100 mL/hr at 01/28/13 2300  . lactated ringers 50 mL/hr at 01/28/13 0953   . amLODipine  5 mg Oral Daily  . atorvastatin  80 mg Oral q1800  . doxycycline  100 mg Oral Q12H  . ertapenem  500 mg Intravenous Q24H  . feeding supplement  237 mL Oral BID BM  . FLUoxetine  20 mg Oral Daily  . gabapentin  300 mg Oral Daily  . heparin subcutaneous  5,000 Units Subcutaneous Q8H  . insulin aspart  0-20 Units Subcutaneous TID WC  . insulin aspart  0-5 Units Subcutaneous QHS  . insulin detemir  70 Units Subcutaneous BID  . levothyroxine  25 mcg Oral QAC breakfast  . metoCLOPramide  5 mg Oral TID WC  . oxybutynin  5 mg Oral Daily  . pantoprazole  40 mg Oral Daily   acetaminophen, acetaminophen, hydrALAZINE, morphine injection, ondansetron (ZOFRAN) IV, ondansetron, oxyCODONE-acetaminophen  Assessment/ Plan:  The patient is a 52 y.o. year-old with hx of DM2, HL, HTN (off meds), cellulitis and depression presented on 6/5 with draining abcess of R groin. She had abcess drained 5d PTA and had been started on po abx but the area continued to drain.  Acute kidney injury- in patient with probable underlying diabetic nephropathy (proteinuria) who received IV contrast on 6/5 and IV Vanc 6/5 thru 6/7. Doubt vanc is the cause, suspect contrast nephropathy. Looks euvolemic and without uremic symptoms, making lots of urine.  Creatinine improved today    Will sign off no follow up unless needed by PCP   LOS: 6 Issaac Shipper W @TODAY @9 :48 AM

## 2013-01-29 NOTE — Progress Notes (Signed)
I have seen and examined the patient and agree with the assessment and plans.  Xiomar Crompton A. Salah Nakamura  MD, FACS  

## 2013-01-29 NOTE — Progress Notes (Signed)
PT Cancellation Note  Patient Details Name: MEGANNE RITA MRN: [id number] DOB: 07-01-1961   Cancelled Treatment:    Reason Eval/Treat Not Completed: Pain limiting ability to participate   [employer], [place] 01/29/2013, 11:23 AM 01/29/2013 Fredrich Birks PTA [phone] pager [phone] office

## 2013-01-29 NOTE — Progress Notes (Signed)
[facility] PROGRESS NOTE  [name] ZOX:[id number] DOB: June 17, 1961 DOA: 01/23/2013 PCP: [name], MD  Assessment/Plan:  Abscess of groin, right  -Status post I&D 6/6. -Blood cultures from 5/31 shows no growth. Strep noted from abscess.  -continue doxycycline and invanz  -non toxic in appearance  -surgery took her again to OR on 6/10 for further I/D/W  ARF  -Likely secondary to contrast nephropathy -holding nephrotoxic agents  -foley place to follow urine output closely  -renal US essentially normal  -UA not suggesting UTI  -Cr improved to 1.3 today  -will continue IVF's -will follow renal rec's.  -normal WBC's and no fever  Uncontrolled DIABETES MELLITUS, TYPE II  Poorly controlled.  -HBA1C is 12.9.  -will continue levemir and SSI, further adjustments base on CBG's levels.  DEPRESSION  Appears stable at this time, normal affect  -Continue Fluoxetine.  PERIPHERAL NEUROPATHY  Complication of diabetes, continue Neurontin. But will continue 300mg  once a day given worsening renal function and Cr clearance  Hyponatremia  -Most likely due to hypovolemia. Continue IV fluids. Stopped diuretics.  -resolved.  HTN (hypertension)  -Labile -Hold off on ACEI for now secondary to ARF -Diuretic on hold as above -On PRN hydralazine -Consider a trial of Ca channel blocker -Continue to monitor closely. Anemia of chronic disease  Hg and Hct stable and at pt's baseline.  Will monitor  Mild hyperkalemia:  -normalized   Code Status: Full Family Communication: Pt in room (indicate person spoken with, relationship, and if by phone, the number) Disposition Plan: Pending   Consultants:  General surgery  Nephrology  Procedures:  Wound I/D on 6/6  R Groin Wound I/D 6/10  Antibiotics: IV Vanc 6/5>>>6/8  IV Ertapenem 6/5>>>  Doxycycline 6/8>>>  HPI/Subjective: Pt is without complaints.  Objective: Filed Vitals:   01/28/13 2111 01/29/13 0200 01/29/13 0500  01/29/13 0600  BP: 190/77 134/64  186/72  Pulse: 68 66  66  Temp: 98.5 F (36.9 C) 98.8 F (37.1 C)  98.1 F (36.7 C)  TempSrc:      Resp: 16 16  16   Height:      Weight:   106.369 kg (234 lb 8 oz)   SpO2: 94% 93%  95%    Intake/Output Summary (Last 24 hours) at 01/29/13 0808 Last data filed at 01/28/13 2300  Gross per 24 hour  Intake   1420 ml  Output   1450 ml  Net    -30 ml   Filed Weights   01/28/13 0500 01/28/13 0554 01/29/13 0500  Weight: 105.053 kg (231 lb 9.6 oz) 104.736 kg (230 lb 14.4 oz) 106.369 kg (234 lb 8 oz)    Exam:   General:  Awake, in nad  Cardiovascular: Regular, s1, s2  Respiratory: Normal resp effort  Abdomen: Soft, nondistended  Musculoskeletal: perfused, no clubbing , s/p RLE amputation, post-surgical dressings, CDI   Data Reviewed: Basic Metabolic Panel:  Recent Labs Lab 01/23/13 2258  01/26/13 0423 01/27/13 0405 01/28/13 0445 01/28/13 0908 01/29/13 0520  NA 131*  < > 132* 134* 137 142 141  K 3.5  < > 4.5 4.8 5.4* 3.8 4.0  CL 90*  < > 96 100 105 105 105  CO2 27  < > 23 22 22 25 24   GLUCOSE 316*  < > 252* 329* 116* 90 150*  BUN 34*  < > 57* 49* 40* 38* 26*  CREATININE 0.93  < > 4.74* 2.75* 1.74* 1.73* 1.37*  CALCIUM 10.0  < > 8.9  8.8 8.8 8.8 8.4  MG 1.8  --   --   --   --   --   --   PHOS 4.2  --   --  5.0*  --  4.2  --   < > = values in this interval not displayed. Liver Function Tests:  Recent Labs Lab 01/23/13 2258 01/24/13 0555 01/27/13 0405 01/28/13 0908  AST 11 11  --   --   ALT 11 11  --   --   ALKPHOS 140* 155*  --   --   BILITOT 0.2* 0.1*  --   --   PROT 7.3 6.8  --   --   ALBUMIN 2.0* 1.6* 1.9* 1.9*   No results found for this basename: LIPASE, AMYLASE,  in the last 168 hours No results found for this basename: AMMONIA,  in the last 168 hours CBC:  Recent Labs Lab 01/23/13 1903 01/23/13 2258 01/24/13 0555 01/25/13 0455 01/26/13 0423 01/28/13 0745  WBC 11.0* 12.4* 10.5 10.7* 8.8 9.2  NEUTROABS  7.1 8.1*  --   --   --   --   HGB 11.3* 11.9* 11.2* 10.4* 9.8* 9.3*  HCT 33.4* 33.8* 32.5* 30.8* 29.2* 27.9*  MCV 82.7 81.6 82.1 83.0 83.0 83.0  PLT 309 334 308 299 267 266   Cardiac Enzymes: No results found for this basename: CKTOTAL, CKMB, CKMBINDEX, TROPONINI,  in the last 168 hours BNP (last 3 results) No results found for this basename: PROBNP,  in the last 8760 hours CBG:  Recent Labs Lab 01/28/13 1316 01/28/13 1648 01/28/13 1808 01/28/13 2117 01/29/13 0643  GLUCAP 87 68* 140* 195* 133*    Recent Results (from the past 240 hour(s))  SURGICAL PCR SCREEN     Status: Abnormal   Collection Time    01/24/13  5:54 AM      Result Value Range Status   MRSA, PCR NEGATIVE  NEGATIVE Final   Staphylococcus aureus POSITIVE (*) NEGATIVE Final   Comment:            The Xpert SA Assay (FDA     approved for NASAL specimens     in patients over 34 years of age),     is one component of     a comprehensive surveillance     program.  Test performance has     been validated by [employer] for patients greater     than or equal to 73 year old.     It is not intended     to diagnose infection nor to     guide or monitor treatment.  CULTURE, ROUTINE-ABSCESS     Status: None   Collection Time    01/27/13  3:50 PM      Result Value Range Status   Specimen Description ABSCESS GROIN RIGHT   Final   Special Requests Normal   Final   Gram Stain     Final   Value: FEW WBC PRESENT,BOTH PMN AND MONONUCLEAR     NO SQUAMOUS EPITHELIAL CELLS SEEN     NO ORGANISMS SEEN   Culture NO GROWTH   Final   Report Status PENDING   Incomplete     Studies: No results found.  Scheduled Meds: . atorvastatin  80 mg Oral q1800  . doxycycline  100 mg Oral Q12H  . ertapenem  500 mg Intravenous Q24H  . feeding supplement  237 mL Oral BID BM  . FLUoxetine  20 mg Oral Daily  . gabapentin  300 mg Oral Daily  . heparin subcutaneous  5,000 Units Subcutaneous Q8H  . insulin aspart  0-20 Units  Subcutaneous TID WC  . insulin aspart  0-5 Units Subcutaneous QHS  . insulin detemir  70 Units Subcutaneous BID  . levothyroxine  25 mcg Oral QAC breakfast  . metoCLOPramide  5 mg Oral TID WC  . oxybutynin  5 mg Oral Daily  . pantoprazole  40 mg Oral Daily   Continuous Infusions: . sodium chloride 100 mL/hr at 01/28/13 2300  . lactated ringers 50 mL/hr at 01/28/13 1610    Principal Problem:   Abscess of groin, right Active Problems:   Diabetes mellitus type 2, uncontrolled, with complications   DEPRESSION   PERIPHERAL NEUROPATHY   HYPERCHOLESTEROLEMIA   Hyponatremia   HTN (hypertension)   Leukocytosis   Anemia of chronic disease   Hypothyroidism   ARF (acute renal failure)   Proteinuria    Time spent:    [name] K  [facility] Pager [phone]. If 7PM-7AM, please contact night-coverage at [email or web address], password [facility] 01/29/2013, 8:08 AM  LOS: 6 days

## 2013-01-29 NOTE — Progress Notes (Signed)
ANTIBIOTIC CONSULT NOTE - FOLLOW UP  Pharmacy Consult for ertapenem Indication: right groin infection  Allergies  Allergen Reactions  . Gadolinium      Code: VOM, Desc: Pt began vomiting immed post infusion of multihance, Onset Date: [id number]   . Ivp Dye (Iodinated Diagnostic Agents) Nausea And Vomiting  . Metformin Other (See Comments)     diarrhea  . Penicillins Hives    Patient Measurements: Height: 5\' 5"  (165.1 cm) Weight: 234 lb 8 oz (106.369 kg) IBW/kg (Calculated) : 57  Vital Signs: Temp: 98.1 F (36.7 C) (06/11 0600) BP: 186/72 mmHg (06/11 0600) Pulse Rate: 66 (06/11 0600) Intake/Output from previous day: 06/10 0701 - 06/11 0700 In: 1420 [P.O.:720; I.V.:700] Out: 1450 [Urine:1400; Blood:50] Intake/Output from this shift:    Labs:  Recent Labs  01/26/13 1728  01/28/13 0445 01/28/13 0745 01/28/13 0908 01/29/13 0520  WBC  --   --   --  9.2  --   --   HGB  --   --   --  9.3*  --   --   PLT  --   --   --  266  --   --   LABCREA 108.31  110.84  --   --   --   --   --   CREATININE  --   < > 1.74*  --  1.73* 1.37*  < > = values in this interval not displayed. Estimated Creatinine Clearance: 58.9 ml/min (by C-G formula based on Cr of 1.37). No results found for this basename: VANCOTROUGH, Leodis Binet, VANCORANDOM, GENTTROUGH, GENTPEAK, GENTRANDOM, TOBRATROUGH, TOBRAPEAK, TOBRARND, AMIKACINPEAK, AMIKACINTROU, AMIKACIN,  in the last 72 hours   Microbiology: Recent Results (from the past 720 hour(s))  CULTURE, ROUTINE-ABSCESS     Status: None   Collection Time    01/18/13  5:48 AM      Result Value Range Status   Specimen Description ABSCESS   Final   Special Requests GROIN RIGHT   Final   Gram Stain     Final   Value: NO WBC SEEN     NO SQUAMOUS EPITHELIAL CELLS SEEN     MODERATE GRAM POSITIVE COCCI     IN PAIRS IN CHAINS   Culture     Final   Value: MODERATE GROUP B STREP(S.AGALACTIAE)ISOLATED     Note: TESTING AGAINST S. AGALACTIAE NOT ROUTINELY  PERFORMED DUE TO PREDICTABILITY OF AMP/PEN/VAN SUSCEPTIBILITY.   Report Status 01/21/2013 FINAL   Final  CULTURE, BLOOD (ROUTINE X 2)     Status: None   Collection Time    01/18/13  6:45 AM      Result Value Range Status   Specimen Description BLOOD LEFT ARM   Final   Special Requests BOTTLES DRAWN AEROBIC AND ANAEROBIC 10CC   Final   Culture  Setup Time 01/18/2013 11:19   Final   Culture NO GROWTH 5 DAYS   Final   Report Status 01/24/2013 FINAL   Final  CULTURE, BLOOD (ROUTINE X 2)     Status: None   Collection Time    01/18/13  6:52 AM      Result Value Range Status   Specimen Description BLOOD LEFT HAND   Final   Special Requests BOTTLES DRAWN AEROBIC AND ANAEROBIC 10CC   Final   Culture  Setup Time 01/18/2013 11:19   Final   Culture NO GROWTH 5 DAYS   Final   Report Status 01/24/2013 FINAL   Final  SURGICAL PCR SCREEN  Status: Abnormal   Collection Time    01/24/13  5:54 AM      Result Value Range Status   MRSA, PCR NEGATIVE  NEGATIVE Final   Staphylococcus aureus POSITIVE (*) NEGATIVE Final   Comment:            The Xpert SA Assay (FDA     approved for NASAL specimens     in patients over 10 years of age),     is one component of     a comprehensive surveillance     program.  Test performance has     been validated by [employer] for patients greater     than or equal to 41 year old.     It is not intended     to diagnose infection nor to     guide or monitor treatment.  CULTURE, ROUTINE-ABSCESS     Status: None   Collection Time    01/27/13  3:50 PM      Result Value Range Status   Specimen Description ABSCESS GROIN RIGHT   Final   Special Requests Normal   Final   Gram Stain     Final   Value: FEW WBC PRESENT,BOTH PMN AND MONONUCLEAR     NO SQUAMOUS EPITHELIAL CELLS SEEN     NO ORGANISMS SEEN   Culture NO GROWTH   Final   Report Status PENDING   Incomplete    Anti-infectives   Start     Dose/Rate Route Frequency Ordered Stop   01/29/13 2300   ertapenem (INVANZ) 1 g in sodium chloride 0.9 % 50 mL IVPB     1 g 100 mL/hr over 30 Minutes Intravenous Every 24 hours 01/29/13 1418     01/26/13 1530  doxycycline (VIBRA-TABS) tablet 100 mg     100 mg Oral Every 12 hours 01/26/13 1427     01/25/13 2300  ertapenem (INVANZ) 0.5 g in sodium chloride 0.9 % 50 mL IVPB  Status:  Discontinued     500 mg 100 mL/hr over 30 Minutes Intravenous Every 24 hours 01/25/13 1906 01/29/13 1418   01/25/13 0000  ertapenem (INVANZ) 1 g in sodium chloride 0.9 % 50 mL IVPB  Status:  Discontinued     1 g 100 mL/hr over 30 Minutes Intravenous Every 24 hours 01/23/13 2307 01/25/13 1902   01/24/13 1000  vancomycin (VANCOCIN) IVPB 1000 mg/200 mL premix  Status:  Discontinued     1,000 mg 200 mL/hr over 60 Minutes Intravenous Every 12 hours 01/23/13 2309 01/25/13 1859   01/23/13 2315  vancomycin (VANCOCIN) IVPB 1000 mg/200 mL premix     1,000 mg 200 mL/hr over 60 Minutes Intravenous  Once 01/23/13 2309 01/24/13 0314   01/23/13 2300  ertapenem (INVANZ) 1 g in sodium chloride 0.9 % 50 mL IVPB     1 g 100 mL/hr over 30 Minutes Intravenous  Once 01/23/13 2252 01/24/13 0053   01/23/13 2200  vancomycin (VANCOCIN) IVPB 1000 mg/200 mL premix     1,000 mg 200 mL/hr over 60 Minutes Intravenous  Once 01/23/13 2146 01/23/13 2311      Assessment: 52 y/o female on day 4 ertapenem for right groin infection s/p I&D on 6/6. Abscess culture growing Strep. Renal function has significantly improved. She is afebrile and WBC are normal.  Goal of Therapy:  resolution of infection  Plan:  -Increase ertapenem to 1 g IV q24h for improved renal function -Consider narrowing to ceftriaxone for  remaining treatment  [facility], [address].D., BCPS Clinical Pharmacist Pager: [phone] 01/29/2013 2:21 PM

## 2013-01-30 ENCOUNTER — Inpatient Hospital Stay (HOSPITAL_COMMUNITY): Payer: Medicaid Other

## 2013-01-30 ENCOUNTER — Encounter (HOSPITAL_COMMUNITY): Payer: Self-pay | Admitting: Surgery

## 2013-01-30 LAB — COMPREHENSIVE METABOLIC PANEL
Albumin: 1.8 g/dL — ABNORMAL LOW (ref 3.5–5.2)
Alkaline Phosphatase: 244 U/L — ABNORMAL HIGH (ref 39–117)
BUN: 20 mg/dL (ref 6–23)
Creatinine, Ser: 1.18 mg/dL — ABNORMAL HIGH (ref 0.50–1.10)
GFR calc Af Amer: 61 mL/min — ABNORMAL LOW (ref >90)
Glucose, Bld: 123 mg/dL — ABNORMAL HIGH (ref 70–99)
Potassium: 4.1 mEq/L (ref 3.5–5.1)
Total Bilirubin: 0.2 mg/dL — ABNORMAL LOW (ref 0.3–1.2)
Total Protein: 6.3 g/dL (ref 6.0–8.3)

## 2013-01-30 LAB — GLUCOSE, CAPILLARY
Glucose-Capillary: 128 mg/dL — ABNORMAL HIGH (ref 70–99)
Glucose-Capillary: 184 mg/dL — ABNORMAL HIGH (ref 70–99)
Glucose-Capillary: 201 mg/dL — ABNORMAL HIGH (ref 70–99)
Glucose-Capillary: 149 mg/dL — ABNORMAL HIGH (ref 70–99)

## 2013-01-30 LAB — CULTURE, ROUTINE-ABSCESS

## 2013-01-30 LAB — URINALYSIS, ROUTINE W REFLEX MICROSCOPIC
Bilirubin Urine: NEGATIVE
Ketones, ur: NEGATIVE mg/dL
Nitrite: NEGATIVE
Specific Gravity, Urine: 1.016 (ref 1.005–1.030)
Urobilinogen, UA: 0.2 mg/dL (ref 0.0–1.0)

## 2013-01-30 LAB — BLOOD GAS, ARTERIAL
Bicarbonate: 29 mEq/L — ABNORMAL HIGH (ref 20.0–24.0)
TCO2: 30.6 mmol/L (ref 0–100)
pH, Arterial: 7.354 (ref 7.350–7.450)
pO2, Arterial: 107 mmHg — ABNORMAL HIGH (ref 80.0–100.0)

## 2013-01-30 LAB — URINE MICROSCOPIC-ADD ON

## 2013-01-30 MED ORDER — GABAPENTIN 800 MG PO TABS: 800.0000 mg | ORAL_TABLET | Freq: Three times a day (TID) | ORAL | Status: DC

## 2013-01-30 MED ORDER — DEXTROSE 5 % IV SOLN
1.0000 g | INTRAVENOUS | Status: DC
  Administered 2013-01-30 – 2013-02-02 (×4): 1 g via INTRAVENOUS
  Filled 2013-01-30 (×5): qty 10

## 2013-01-30 MED ORDER — GABAPENTIN 400 MG PO CAPS
800.0000 mg | ORAL_CAPSULE | Freq: Three times a day (TID) | ORAL | Status: DC
  Administered 2013-01-30 – 2013-02-03 (×13): 800 mg via ORAL
  Filled 2013-01-30 (×17): qty 2

## 2013-01-30 NOTE — Progress Notes (Signed)
PT Cancellation Note  Patient Details Name: [name] MRN: [id number] DOB: 05-Jan-1961   Cancelled Treatment:    Reason Eval/Treat Not Completed: Pain limiting ability to participate;Fatigue/lethargy limiting ability to participate. Pt refusing therapy at this time. Stated "i just know my body and i dont think i want to get up today. i promise i will try tomorrow". Will re-attempt at a later time.  Pt c/o pain 9/10 and edema in R LE.    [name], [place] [phone] 01/30/2013, 2:33 PM

## 2013-01-30 NOTE — ED Provider Notes (Signed)
Medical screening examination/treatment/procedure(s) were performed by non-physician practitioner and as supervising physician I was immediately available for consultation/collaboration.   Shelda Jakes, MD 01/30/13 [phone]

## 2013-01-30 NOTE — Progress Notes (Signed)
Patient pre-med for dressing change and still very restless, uncooperative at times, difficult to do.

## 2013-01-30 NOTE — Progress Notes (Signed)
2 Days Post-Op  Subjective: Dressing changed at &AM with percocet, dilaudid and Zofran.   Mother worried about her being transferred to SNF.  Objective: Vital signs in last 24 hours: Temp:  [97.9 F (36.6 C)-98.5 F (36.9 C)] 98.3 F (36.8 C) (06/12 0900) Pulse Rate:  [65-75] 66 (06/12 0900) Resp:  [18] 18 (06/12 0523) BP: (152-175)/(60-91) 152/60 mmHg (06/12 0900) SpO2:  [94 %-95 %] 95 % (06/12 0900) Weight:  [106.822 kg (235 lb 8 oz)] 106.822 kg (235 lb 8 oz) (06/12 0500) Last BM Date: 01/29/13 Diet: Carb modified Afebrile, VSS Ongoing improvement of renal function, LFT'S up some.  Intake/Output from previous day: 06/11 0701 - 06/12 0700 In: 1652.5 [P.O.:880; I.V.:772.5] Out: 2450 [Urine:2450] Intake/Output this shift: Total I/O In: 240 [P.O.:240] Out: -   General appearance: alert, cooperative and Dressing is clean, changed at 7AM Skin: dressing intact, will look at in AM tomorrow.  Lab Results:   Recent Labs  01/28/13 0745  WBC 9.2  HGB 9.3*  HCT 27.9*  PLT 266    BMET  Recent Labs  01/29/13 0520 01/30/13 0425  NA 141 140  K 4.0 4.1  CL 105 103  CO2 24 28  GLUCOSE 150* 123*  BUN 26* 20  CREATININE 1.37* 1.18*  CALCIUM 8.4 8.5   PT/INR No results found for this basename: LABPROT, INR,  in the last 72 hours   Recent Labs Lab 01/23/13 2258 01/24/13 0555 01/27/13 0405 01/28/13 0908 01/30/13 0425  AST 11 11  --   --  72*  ALT 11 11  --   --  236*  ALKPHOS 140* 155*  --   --  244*  BILITOT 0.2* 0.1*  --   --  0.2*  PROT 7.3 6.8  --   --  6.3  ALBUMIN 2.0* 1.6* 1.9* 1.9* 1.8*     Lipase     Component Value Date/Time   LIPASE 38 12/05/2012 0317     Studies/Results: No results found.  Medications: . amLODipine  5 mg Oral Daily  . atorvastatin  80 mg Oral q1800  . doxycycline  100 mg Oral Q12H  . ertapenem  1 g Intravenous Q24H  . feeding supplement  237 mL Oral BID BM  . FLUoxetine  20 mg Oral Daily  . gabapentin  300 mg Oral  Daily  . heparin subcutaneous  5,000 Units Subcutaneous Q8H  . insulin aspart  0-20 Units Subcutaneous TID WC  . insulin aspart  0-5 Units Subcutaneous QHS  . insulin detemir  70 Units Subcutaneous BID  . levothyroxine  25 mcg Oral QAC breakfast  . metoCLOPramide  5 mg Oral TID WC  .  morphine injection  2 mg Intravenous Once  . oxybutynin  5 mg Oral Daily  . pantoprazole  40 mg Oral Daily  . triamcinolone cream   Topical BID    Assessment/Plan Right groin wound. Irrigation and debridement of the right groin Nichole Campus, MD, 01/28/2013  INCISION AND DRAINAGE ABSCESS right groin abcess, 01/24/2013, Nichole Pu. Cornett, MD.  +MRSA on nasal swab  Wound culture: MODERATE GROUP B STREP(S.AGALACTIAE)ISOLATED Still on antibiotics; day 6 Invanz yesterday next dose at 2300 today; and starting day 5 of Doxycycline today.  No change in cultures. No growth on blood cultures  DIABETES MELLITUS, TYPE II  - uncontrolled, with neuropathy  Severe renal insuffiencey (creatinine up to 4.74, yesterday down to 2.75)  DEPRESSION  HTN (hypertension)  Hyponatremia  Anemia of chronic disease Foley  in place  Plan:  Continue BID dressing changes,    LOS: 7 days    [name] 01/30/2013

## 2013-01-30 NOTE — Progress Notes (Signed)
[facility] PROGRESS NOTE  [name] WJX:[id number] DOB: 1961-06-05 DOA: 01/23/2013 PCP: [name], MD  Assessment/Plan: Abscess of groin, right  -Status post I&D 6/6.  -Blood cultures from 5/31 shows no growth. Strep noted from abscess.  -continue doxycycline and invanz for now -non toxic in appearance  -surgery took her again to OR on 6/10 for further I/D/W  ARF  -Likely secondary to contrast nephropathy  -holding nephrotoxic agents  -foley place to follow urine output closely  -renal US essentially normal  -Cr stable  -will continue IVF's   Uncontrolled DIABETES MELLITUS, TYPE II   -HBA1C is 12.9.  -will continue levemir and SSI, further adjustments base on CBG's levels.  -Currently stable DEPRESSION  Appears stable at this time, normal affect  -Continue Fluoxetine.  PERIPHERAL NEUROPATHY  Complication of diabetes, continue Neurontin. But will continue 300mg  once a day given worsening renal function and Cr clearance  Hyponatremia  -Most likely due to hypovolemia.  -resolved.  HTN (hypertension)  -Labile, may be affected by pain -Diuretic on hold as above  -On PRN hydralazine  -Ca channel blocker started -Continue to monitor closely.  Anemia of chronic disease  Hg and Hct stable and at pt's baseline.  Will monitor  Mild hyperkalemia:  -normalized Hypoxia: -Noted to have sats in the mid-lower 80's on RA. -By history, pt strongly suspected to have undiagnosed OSA -Active tobacco abuse, no wheezing -Will check cxr and abg  Code Status: Full Family Communication: Pt and family in room (indicate person spoken with, relationship, and if by phone, the number) Disposition Plan: Pending   Consultants:  General Surgery  Nephrology  Procedures: Wound I/D on 6/6 R Groin Wound I/D 6/10   Antibiotics: IV Vanc 6/5>>>6/8  IV Ertapenem 6/5>>>  Doxycycline 6/8>>>   HPI/Subjective: No complaints this AM. Pt noted to have marked pain at the post  surgical site. Also noted to desaturate as noted above.  Objective: Filed Vitals:   01/30/13 0500 01/30/13 0523 01/30/13 0900 01/30/13 1042  BP:  166/60 152/60 176/72  Pulse:  65 66   Temp:  98.5 F (36.9 C) 98.3 F (36.8 C)   TempSrc:  Oral    Resp:  18    Height:      Weight: 106.822 kg (235 lb 8 oz)     SpO2:  95% 95%     Intake/Output Summary (Last 24 hours) at 01/30/13 1127 Last data filed at 01/30/13 0900  Gross per 24 hour  Intake 1572.5 ml  Output   2450 ml  Net -877.5 ml   Filed Weights   01/28/13 0554 01/29/13 0500 01/30/13 0500  Weight: 104.736 kg (230 lb 14.4 oz) 106.369 kg (234 lb 8 oz) 106.822 kg (235 lb 8 oz)    Exam:   General:  Awake, in NAD  Cardiovascular: Regular, s1, s2  Respiratory: normal resp effort, no wheezing or crackles  Abdomen: soft, nondistended  Musculoskeletal: perfused, no clubbing, s/p R LE amputation   Data Reviewed: Basic Metabolic Panel:  Recent Labs Lab 01/23/13 2258  01/27/13 0405 01/28/13 0445 01/28/13 0908 01/29/13 0520 01/30/13 0425  NA 131*  < > 134* 137 142 141 140  K 3.5  < > 4.8 5.4* 3.8 4.0 4.1  CL 90*  < > 100 105 105 105 103  CO2 27  < > 22 22 25 24 28   GLUCOSE 316*  < > 329* 116* 90 150* 123*  BUN 34*  < > 49* 40* 38* 26* 20  CREATININE 0.93  < > 2.75* 1.74* 1.73* 1.37* 1.18*  CALCIUM 10.0  < > 8.8 8.8 8.8 8.4 8.5  MG 1.8  --   --   --   --   --   --   PHOS 4.2  --  5.0*  --  4.2  --   --   < > = values in this interval not displayed. Liver Function Tests:  Recent Labs Lab 01/23/13 2258 01/24/13 0555 01/27/13 0405 01/28/13 0908 01/30/13 0425  AST 11 11  --   --  72*  ALT 11 11  --   --  236*  ALKPHOS 140* 155*  --   --  244*  BILITOT 0.2* 0.1*  --   --  0.2*  PROT 7.3 6.8  --   --  6.3  ALBUMIN 2.0* 1.6* 1.9* 1.9* 1.8*   No results found for this basename: LIPASE, AMYLASE,  in the last 168 hours No results found for this basename: AMMONIA,  in the last 168 hours CBC:  Recent  Labs Lab 01/23/13 1903 01/23/13 2258 01/24/13 0555 01/25/13 0455 01/26/13 0423 01/28/13 0745  WBC 11.0* 12.4* 10.5 10.7* 8.8 9.2  NEUTROABS 7.1 8.1*  --   --   --   --   HGB 11.3* 11.9* 11.2* 10.4* 9.8* 9.3*  HCT 33.4* 33.8* 32.5* 30.8* 29.2* 27.9*  MCV 82.7 81.6 82.1 83.0 83.0 83.0  PLT 309 334 308 299 267 266   Cardiac Enzymes: No results found for this basename: CKTOTAL, CKMB, CKMBINDEX, TROPONINI,  in the last 168 hours BNP (last 3 results) No results found for this basename: PROBNP,  in the last 8760 hours CBG:  Recent Labs Lab 01/29/13 1621 01/29/13 2210 01/30/13 0207 01/30/13 0658 01/30/13 1105  GLUCAP 119* 144* 128* 158* 184*    Recent Results (from the past 240 hour(s))  SURGICAL PCR SCREEN     Status: Abnormal   Collection Time    01/24/13  5:54 AM      Result Value Range Status   MRSA, PCR NEGATIVE  NEGATIVE Final   Staphylococcus aureus POSITIVE (*) NEGATIVE Final   Comment:            The Xpert SA Assay (FDA     approved for NASAL specimens     in patients over 70 years of age),     is one component of     a comprehensive surveillance     program.  Test performance has     been validated by [employer] for patients greater     than or equal to 44 year old.     It is not intended     to diagnose infection nor to     guide or monitor treatment.  CULTURE, ROUTINE-ABSCESS     Status: None   Collection Time    01/27/13  3:50 PM      Result Value Range Status   Specimen Description ABSCESS GROIN RIGHT   Final   Special Requests Normal   Final   Gram Stain     Final   Value: FEW WBC PRESENT,BOTH PMN AND MONONUCLEAR     NO SQUAMOUS EPITHELIAL CELLS SEEN     NO ORGANISMS SEEN   Culture     Final   Value: FEW GROUP B STREP(S.AGALACTIAE)ISOLATED     Note: TESTING AGAINST S. AGALACTIAE NOT ROUTINELY PERFORMED DUE TO PREDICTABILITY OF AMP/PEN/VAN SUSCEPTIBILITY.   Report Status 01/30/2013 FINAL  Final     Studies: No results found.  Scheduled  Meds: . amLODipine  5 mg Oral Daily  . atorvastatin  80 mg Oral q1800  . doxycycline  100 mg Oral Q12H  . ertapenem  1 g Intravenous Q24H  . feeding supplement  237 mL Oral BID BM  . FLUoxetine  20 mg Oral Daily  . gabapentin  300 mg Oral Daily  . heparin subcutaneous  5,000 Units Subcutaneous Q8H  . insulin aspart  0-20 Units Subcutaneous TID WC  . insulin aspart  0-5 Units Subcutaneous QHS  . insulin detemir  70 Units Subcutaneous BID  . levothyroxine  25 mcg Oral QAC breakfast  . metoCLOPramide  5 mg Oral TID WC  .  morphine injection  2 mg Intravenous Once  . oxybutynin  5 mg Oral Daily  . pantoprazole  40 mg Oral Daily  . triamcinolone cream   Topical BID   Continuous Infusions: . sodium chloride 75 mL/hr at 01/29/13 1900  . lactated ringers 50 mL/hr at 01/28/13 1610    Principal Problem:   Abscess of groin, right Active Problems:   Diabetes mellitus type 2, uncontrolled, with complications   DEPRESSION   PERIPHERAL NEUROPATHY   HYPERCHOLESTEROLEMIA   Hyponatremia   HTN (hypertension)   Leukocytosis   Anemia of chronic disease   Hypothyroidism   ARF (acute renal failure)   Proteinuria    Time spent:    Layal Javid K  [facility] Pager [phone]. If 7PM-7AM, please contact night-coverage at [email or web address], password [facility] 01/30/2013, 11:27 AM  LOS: 7 days

## 2013-01-30 NOTE — Progress Notes (Signed)
I have seen and examined the patient and agree with the assessment and plans.  Diaz Crago A. Soniya Ashraf  MD, FACS  

## 2013-01-30 NOTE — Progress Notes (Addendum)
ANTIBIOTIC CONSULT NOTE - INITIAL  Pharmacy Consult for ceftriaxone Indication: R groin infection  Allergies  Allergen Reactions  . Gadolinium      Code: VOM, Desc: Pt began vomiting immed post infusion of multihance, Onset Date: [id number]   . Ivp Dye (Iodinated Diagnostic Agents) Nausea And Vomiting  . Metformin Other (See Comments)     diarrhea  . Penicillins Hives    Patient Measurements: Height: 5\' 5"  (165.1 cm) Weight: 235 lb 8 oz (106.822 kg) IBW/kg (Calculated) : 57  Vital Signs: Temp: 97.7 F (36.5 C) (06/12 1042) Temp src: Oral (06/12 1042) BP: 176/72 mmHg (06/12 1042) Pulse Rate: 63 (06/12 1042) Intake/Output from previous day: 06/11 0701 - 06/12 0700 In: 1652.5 [P.O.:880; I.V.:772.5] Out: 2450 [Urine:2450] Intake/Output from this shift: Total I/O In: 240 [P.O.:240] Out: -   Labs:  Recent Labs  01/28/13 0745 01/28/13 0908 01/29/13 0520 01/30/13 0425  WBC 9.2  --   --   --   HGB 9.3*  --   --   --   PLT 266  --   --   --   CREATININE  --  1.73* 1.37* 1.18*   Estimated Creatinine Clearance: 68.5 ml/min (by C-G formula based on Cr of 1.18). No results found for this basename: VANCOTROUGH, Leodis Binet, VANCORANDOM, GENTTROUGH, GENTPEAK, GENTRANDOM, TOBRATROUGH, TOBRAPEAK, TOBRARND, AMIKACINPEAK, AMIKACINTROU, AMIKACIN,  in the last 72 hours   Microbiology: Recent Results (from the past 720 hour(s))  CULTURE, ROUTINE-ABSCESS     Status: None   Collection Time    01/18/13  5:48 AM      Result Value Range Status   Specimen Description ABSCESS   Final   Special Requests GROIN RIGHT   Final   Gram Stain     Final   Value: NO WBC SEEN     NO SQUAMOUS EPITHELIAL CELLS SEEN     MODERATE GRAM POSITIVE COCCI     IN PAIRS IN CHAINS   Culture     Final   Value: MODERATE GROUP B STREP(S.AGALACTIAE)ISOLATED     Note: TESTING AGAINST S. AGALACTIAE NOT ROUTINELY PERFORMED DUE TO PREDICTABILITY OF AMP/PEN/VAN SUSCEPTIBILITY.   Report Status 01/21/2013 FINAL    Final  CULTURE, BLOOD (ROUTINE X 2)     Status: None   Collection Time    01/18/13  6:45 AM      Result Value Range Status   Specimen Description BLOOD LEFT ARM   Final   Special Requests BOTTLES DRAWN AEROBIC AND ANAEROBIC 10CC   Final   Culture  Setup Time 01/18/2013 11:19   Final   Culture NO GROWTH 5 DAYS   Final   Report Status 01/24/2013 FINAL   Final  CULTURE, BLOOD (ROUTINE X 2)     Status: None   Collection Time    01/18/13  6:52 AM      Result Value Range Status   Specimen Description BLOOD LEFT HAND   Final   Special Requests BOTTLES DRAWN AEROBIC AND ANAEROBIC 10CC   Final   Culture  Setup Time 01/18/2013 11:19   Final   Culture NO GROWTH 5 DAYS   Final   Report Status 01/24/2013 FINAL   Final  SURGICAL PCR SCREEN     Status: Abnormal   Collection Time    01/24/13  5:54 AM      Result Value Range Status   MRSA, PCR NEGATIVE  NEGATIVE Final   Staphylococcus aureus POSITIVE (*) NEGATIVE Final   Comment:  The Xpert SA Assay (FDA     approved for NASAL specimens     in patients over 78 years of age),     is one component of     a comprehensive surveillance     program.  Test performance has     been validated by [employer] for patients greater     than or equal to 29 year old.     It is not intended     to diagnose infection nor to     guide or monitor treatment.  CULTURE, ROUTINE-ABSCESS     Status: None   Collection Time    01/27/13  3:50 PM      Result Value Range Status   Specimen Description ABSCESS GROIN RIGHT   Final   Special Requests Normal   Final   Gram Stain     Final   Value: FEW WBC PRESENT,BOTH PMN AND MONONUCLEAR     NO SQUAMOUS EPITHELIAL CELLS SEEN     NO ORGANISMS SEEN   Culture     Final   Value: FEW GROUP B STREP(S.AGALACTIAE)ISOLATED     Note: TESTING AGAINST S. AGALACTIAE NOT ROUTINELY PERFORMED DUE TO PREDICTABILITY OF AMP/PEN/VAN SUSCEPTIBILITY.   Report Status 01/30/2013 FINAL   Final   Medications:  Scheduled:  .  amLODipine  5 mg Oral Daily  . atorvastatin  80 mg Oral q1800  . cefTRIAXone (ROCEPHIN)  IV  1 g Intravenous Q24H  . doxycycline  100 mg Oral Q12H  . feeding supplement  237 mL Oral BID BM  . FLUoxetine  20 mg Oral Daily  . gabapentin  800 mg Oral TID  . heparin subcutaneous  5,000 Units Subcutaneous Q8H  . insulin aspart  0-20 Units Subcutaneous TID WC  . insulin aspart  0-5 Units Subcutaneous QHS  . insulin detemir  70 Units Subcutaneous BID  . levothyroxine  25 mcg Oral QAC breakfast  . metoCLOPramide  5 mg Oral TID WC  .  morphine injection  2 mg Intravenous Once  . oxybutynin  5 mg Oral Daily  . pantoprazole  40 mg Oral Daily  . triamcinolone cream   Topical BID   Assessment: 52 y/o female on day 5 antibiotics for R groin infection s/p I&D on 6/6 and 6/10. R groin abscess growing Strep, blood cultures are NGTD. Pharmacy consulted to begin ceftriaxone.  Goal of Therapy:  resolution of infection  Plan:  -Ceftriaxone 1 g IV q24h - no renal adjustment needed -Consider d/c doxycycline since narrowing to ceftriaxone -Pharmacy signing off  [employer], [place].D., BCPS Clinical Pharmacist Pager: [phone] 01/30/2013 1:47 PM

## 2013-01-31 ENCOUNTER — Inpatient Hospital Stay (HOSPITAL_COMMUNITY): Payer: Medicaid Other

## 2013-01-31 LAB — URINE CULTURE: Culture: NO GROWTH

## 2013-01-31 LAB — GLUCOSE, CAPILLARY

## 2013-01-31 MED ORDER — TECHNETIUM TC 99M DIETHYLENETRIAME-PENTAACETIC ACID: 40.0000 | Freq: Once | INTRAVENOUS | Status: AC | PRN

## 2013-01-31 MED ORDER — TECHNETIUM TO 99M ALBUMIN AGGREGATED
6.0000 | Freq: Once | INTRAVENOUS | Status: AC | PRN
  Administered 2013-01-31: 6 via INTRAVENOUS

## 2013-01-31 NOTE — Progress Notes (Addendum)
Physical Therapy Treatment Patient Details Name: [name] MRN: [id number] DOB: 08-30-1960 Today's Date: 01/31/2013 Time: 2440-1027 PT Time Calculation (min): 23 min  PT Assessment / Plan / Recommendation Comments on Treatment Session  Limited session due to pt. pain and sleepiness as well as PA needing pt. to lie back down for dressing change    Follow Up Recommendations  SNF;Supervision/Assistance - 24 hour     Does the patient have the potential to tolerate intense rehabilitation     Barriers to Discharge        Equipment Recommendations  None recommended by PT    Recommendations for Other Services OT consult  Frequency Min 3X/week   Plan Discharge plan remains appropriate    Precautions / Restrictions Precautions Precautions: Fall Restrictions Weight Bearing Restrictions: No   Pertinent Vitals/Pain See vitals tab     Mobility  Bed Mobility Bed Mobility: Supine to Sit;Sitting - Scoot to Edge of Bed Supine to Sit: 1: +2 Total assist Supine to Sit: Patient Percentage: 30% Sitting - Scoot to Edge of Bed: 4: Min assist Details for Bed Mobility Assistance: Needed 2 assist for coming to sitting mostly due to her degree of sleepiness.  2 assist to return to supine .  Pain and pt. sleepiness limiting factors in mobility. Transfers Transfers: Not assessed Ambulation/Gait Ambulation/Gait Assistance: Not tested (comment)    Exercises     PT Diagnosis:    PT Problem List:   PT Treatment Interventions:     PT Goals Acute Rehab PT Goals Pt will go Supine/Side to Sit: with modified independence PT Goal: Supine/Side to Sit - Progress: Progressing toward goal Pt will go Sit to Supine/Side: with modified independence PT Goal: Sit to Supine/Side - Progress: Progressing toward goal  Visit Information  Last PT Received On: 01/31/13 Assistance Needed: +2    Subjective Data  Subjective: Pt. presents in sleepy state, stating she is hungry.  PA in shortly after PT  session began, stating she is coming in to do dressing change.   Cognition  Cognition Arousal/Alertness: Lethargic Behavior During Therapy: Agitated Overall Cognitive Status: Impaired/Different from baseline Area of Impairment: Attention Current Attention Level: Focused    Balance  Static Sitting Balance Static Sitting - Balance Support: Bilateral upper extremity supported;Feet unsupported Static Sitting - Level of Assistance: 4: Min assist Static Sitting - Comment/# of Minutes: 5  End of Session PT - End of Session Activity Tolerance: Patient limited by fatigue;Patient limited by pain;Other (comment) (PA needed pt to lie back down for dressing change) Patient left: in bed;with call bell/phone within reach;with nursing in room Nurse Communication: Mobility status;Patient requests pain meds   GP     Ferman Hamming 01/31/2013, 10:51 AM Weldon Picking PT Acute Rehab Services [phone] [place] [phone]

## 2013-01-31 NOTE — Progress Notes (Signed)
Morning cbg is 77.  I will not give 0800 Novolog.  Will pass information on to the oncoming nurse. Will provide a snack this morning for patient.

## 2013-01-31 NOTE — Progress Notes (Signed)
I have seen and examined the patient and agree with the assessment and plans.  Tarick Parenteau A. Stavroula Rohde  MD, FACS  

## 2013-01-31 NOTE — Progress Notes (Signed)
SW is continuing to follow and will assist with all d/c needs when medically stable.   Sabino Niemann, MSW, [phone]

## 2013-01-31 NOTE — Progress Notes (Signed)
[facility] PROGRESS NOTE  [name] ZOX:[id number] DOB: 17-Jun-1961 DOA: 01/23/2013 PCP: [name], MD  Assessment/Plan: Abscess of groin, right  -Status post I&D 6/6.  -Blood cultures from 5/31 shows no growth. Strep noted from abscess.  -continue doxycycline and rocephin for now  -non toxic in appearance  -surgery took her again to OR on 6/10 for further I/D/W  ARF  -Resolved -Likely secondary to contrast nephropathy  -holding nephrotoxic agents  -foley place to follow urine output closely  -renal US essentially normal  -Cr stable  -will continue IVF's  Uncontrolled DIABETES MELLITUS, TYPE II  -HBA1C is 12.9.  -will continue levemir and SSI, further adjustments base on CBG's levels.  -Currently stable  DEPRESSION  Appears stable at this time, normal affect  -Continue Fluoxetine.  PERIPHERAL NEUROPATHY  Complication of diabetes, continue Neurontin per home dose  Hyponatremia  -Most likely due to hypovolemia.  -resolved.  HTN (hypertension)  -Labile, may be affected by pain  -Diuretic on hold as above  -On PRN hydralazine  -Ca channel blocker started  -Continue to monitor closely.  Anemia of chronic disease  Hg and Hct stable and at pt's baseline.  Will monitor  Mild hyperkalemia:  -normalized  Hypoxia:  -Noted to have sats in the mid-lower 80's on RA Now on 3L.  -By history, pt strongly suspected to have undiagnosed OSA  -Active tobacco abuse, no wheezing  -CXR and abg unremarkable -Will check VQ scan to r/o PE. Not ordering chest CT secondary to recent contrast induced nephropathy  Code Status: Full Family Communication: Pt in room (indicate person spoken with, relationship, and if by phone, the number) Disposition Plan: Pending   Consultants:  Nephrology  General surgery  Procedures: Wound I/D on 6/6  R Groin Wound I/D 6/10  Antibiotics:   IV Vanc 6/5>>>6/8  IV Ertapenem 6/5>>> 01/30/13 Doxycycline 6/8>>> Rocephin  01/30/13>>>  HPI/Subjective: Pt is without complaints. Denies sob  Objective: Filed Vitals:   01/30/13 1520 01/30/13 2208 01/31/13 0620 01/31/13 0702  BP: 136/54 156/61 132/65   Pulse: 69 73 70   Temp: 98.9 F (37.2 C) 98.7 F (37.1 C) 98.7 F (37.1 C)   TempSrc:      Resp: 20 20 18    Height:      Weight:    106.824 kg (235 lb 8.1 oz)  SpO2: 99% 93% 95%     Intake/Output Summary (Last 24 hours) at 01/31/13 0759 Last data filed at 01/31/13 0215  Gross per 24 hour  Intake    720 ml  Output   2250 ml  Net  -1530 ml   Filed Weights   01/29/13 0500 01/30/13 0500 01/31/13 0702  Weight: 106.369 kg (234 lb 8 oz) 106.822 kg (235 lb 8 oz) 106.824 kg (235 lb 8.1 oz)    Exam:   General:  Drowsy, easily arousable  Cardiovascular: regular, s1, s2  Respiratory: normal resp effort, no wheezing  Abdomen: soft, nondistended  Musculoskeletal: perfused, no clubbing   Data Reviewed: Basic Metabolic Panel:  Recent Labs Lab 01/27/13 0405 01/28/13 0445 01/28/13 0908 01/29/13 0520 01/30/13 0425  NA 134* 137 142 141 140  K 4.8 5.4* 3.8 4.0 4.1  CL 100 105 105 105 103  CO2 22 22 25 24 28   GLUCOSE 329* 116* 90 150* 123*  BUN 49* 40* 38* 26* 20  CREATININE 2.75* 1.74* 1.73* 1.37* 1.18*  CALCIUM 8.8 8.8 8.8 8.4 8.5  PHOS 5.0*  --  4.2  --   --  Liver Function Tests:  Recent Labs Lab 01/27/13 0405 01/28/13 0908 01/30/13 0425  AST  --   --  72*  ALT  --   --  236*  ALKPHOS  --   --  244*  BILITOT  --   --  0.2*  PROT  --   --  6.3  ALBUMIN 1.9* 1.9* 1.8*   No results found for this basename: LIPASE, AMYLASE,  in the last 168 hours No results found for this basename: AMMONIA,  in the last 168 hours CBC:  Recent Labs Lab 01/25/13 0455 01/26/13 0423 01/28/13 0745  WBC 10.7* 8.8 9.2  HGB 10.4* 9.8* 9.3*  HCT 30.8* 29.2* 27.9*  MCV 83.0 83.0 83.0  PLT 299 267 266   Cardiac Enzymes: No results found for this basename: CKTOTAL, CKMB, CKMBINDEX, TROPONINI,  in  the last 168 hours BNP (last 3 results) No results found for this basename: PROBNP,  in the last 8760 hours CBG:  Recent Labs Lab 01/30/13 0658 01/30/13 1105 01/30/13 1615 01/30/13 2224 01/31/13 0644  GLUCAP 158* 184* 201* 149* 77    Recent Results (from the past 240 hour(s))  SURGICAL PCR SCREEN     Status: Abnormal   Collection Time    01/24/13  5:54 AM      Result Value Range Status   MRSA, PCR NEGATIVE  NEGATIVE Final   Staphylococcus aureus POSITIVE (*) NEGATIVE Final   Comment:            The Xpert SA Assay (FDA     approved for NASAL specimens     in patients over 50 years of age),     is one component of     a comprehensive surveillance     program.  Test performance has     been validated by [employer] for patients greater     than or equal to 63 year old.     It is not intended     to diagnose infection nor to     guide or monitor treatment.  CULTURE, ROUTINE-ABSCESS     Status: None   Collection Time    01/27/13  3:50 PM      Result Value Range Status   Specimen Description ABSCESS GROIN RIGHT   Final   Special Requests Normal   Final   Gram Stain     Final   Value: FEW WBC PRESENT,BOTH PMN AND MONONUCLEAR     NO SQUAMOUS EPITHELIAL CELLS SEEN     NO ORGANISMS SEEN   Culture     Final   Value: FEW GROUP B STREP(S.AGALACTIAE)ISOLATED     Note: TESTING AGAINST S. AGALACTIAE NOT ROUTINELY PERFORMED DUE TO PREDICTABILITY OF AMP/PEN/VAN SUSCEPTIBILITY.   Report Status 01/30/2013 FINAL   Final     Studies: Dg Chest Port 1 View  01/30/2013   *RADIOLOGY REPORT*  Clinical Data: Shortness of breath and hypoxia.  PORTABLE CHEST - 1 VIEW  Comparison: 12/05/2012  Findings: Portable view of the chest was obtained.  Heart size is upper limits normal but may be related to the technique.  Slightly prominent lung markings without focal airspace disease or pulmonary edema. Trachea midline.  Slightly increased density along the anterior aspect of the left seventh rib  probably represent overlying shadows and similar to the previous examination.  There may be subsegmental atelectasis in the medial right lower chest.  IMPRESSION: No focal chest disease.  Probable atelectasis in the right lung.  Few densities at the left lung base and near the anterior left seventh rib as described.  Findings probable represent overlying shadows but recommend further evaluation with a two view chest exam.   Original Report Authenticated By: Richarda Overlie, M.D.    Scheduled Meds: . amLODipine  5 mg Oral Daily  . atorvastatin  80 mg Oral q1800  . cefTRIAXone (ROCEPHIN)  IV  1 g Intravenous Q24H  . doxycycline  100 mg Oral Q12H  . feeding supplement  237 mL Oral BID BM  . FLUoxetine  20 mg Oral Daily  . gabapentin  800 mg Oral TID  . heparin subcutaneous  5,000 Units Subcutaneous Q8H  . insulin aspart  0-20 Units Subcutaneous TID WC  . insulin aspart  0-5 Units Subcutaneous QHS  . insulin detemir  70 Units Subcutaneous BID  . levothyroxine  25 mcg Oral QAC breakfast  . metoCLOPramide  5 mg Oral TID WC  .  morphine injection  2 mg Intravenous Once  . oxybutynin  5 mg Oral Daily  . pantoprazole  40 mg Oral Daily  . triamcinolone cream   Topical BID   Continuous Infusions: . sodium chloride 75 mL/hr at 01/29/13 1900  . lactated ringers 50 mL/hr at 01/28/13 1610    Principal Problem:   Abscess of groin, right Active Problems:   Diabetes mellitus type 2, uncontrolled, with complications   DEPRESSION   PERIPHERAL NEUROPATHY   HYPERCHOLESTEROLEMIA   Hyponatremia   HTN (hypertension)   Leukocytosis   Anemia of chronic disease   Hypothyroidism   ARF (acute renal failure)   Proteinuria    Time spent:    Trayvond Viets K  [facility] Pager [phone]. If 7PM-7AM, please contact night-coverage at [email or web address], password [facility] 01/31/2013, 7:59 AM  LOS: 8 days

## 2013-01-31 NOTE — Progress Notes (Signed)
3 Days Post-Op  Subjective: Complaining to PT about pain.  Not wanting to do much.  Doesn't want dressing changed either. SHE is sleepy with the percocet, but continues to cry with pain and want more.  Objective: Vital signs in last 24 hours: Temp:  [97.7 F (36.5 C)-98.9 F (37.2 C)] 98.7 F (37.1 C) (06/13 0620) Pulse Rate:  [63-73] 70 (06/13 0620) Resp:  [18-20] 18 (06/13 0620) BP: (132-176)/(54-72) 132/65 mmHg (06/13 0620) SpO2:  [93 %-99 %] 95 % (06/13 0620) Weight:  [106.824 kg (235 lb 8.1 oz)] 106.824 kg (235 lb 8.1 oz) (06/13 0702) Last BM Date: 01/30/13 720 PO recorded, Diet: Carb modified Afebrile, VSS  Intake/Output from previous day: 06/12 0701 - 06/13 0700 In: 720 [P.O.:720] Out: 2250 [Urine:2250] Intake/Output this shift:    General appearance: alert and always distressed and complaining of pain with any movement or contact. Skin: The dressing was only in the top of wound.  Base was not covered and it has some fibrinous exudate at the base now.  Otherwise it looks pretty good.  Lab Results:  No results found for this basename: WBC, HGB, HCT, PLT,  in the last 72 hours  BMET  Recent Labs  01/29/13 0520 01/30/13 0425  NA 141 140  K 4.0 4.1  CL 105 103  CO2 24 28  GLUCOSE 150* 123*  BUN 26* 20  CREATININE 1.37* 1.18*  CALCIUM 8.4 8.5   PT/INR No results found for this basename: LABPROT, INR,  in the last 72 hours   Recent Labs Lab 01/27/13 0405 01/28/13 0908 01/30/13 0425  AST  --   --  72*  ALT  --   --  236*  ALKPHOS  --   --  244*  BILITOT  --   --  0.2*  PROT  --   --  6.3  ALBUMIN 1.9* 1.9* 1.8*     Lipase     Component Value Date/Time   LIPASE 38 12/05/2012 0317     Studies/Results: Dg Chest Port 1 View  01/30/2013   *RADIOLOGY REPORT*  Clinical Data: Shortness of breath and hypoxia.  PORTABLE CHEST - 1 VIEW  Comparison: 12/05/2012  Findings: Portable view of the chest was obtained.  Heart size is upper limits normal but may be  related to the technique.  Slightly prominent lung markings without focal airspace disease or pulmonary edema. Trachea midline.  Slightly increased density along the anterior aspect of the left seventh rib probably represent overlying shadows and similar to the previous examination.  There may be subsegmental atelectasis in the medial right lower chest.  IMPRESSION: No focal chest disease.  Probable atelectasis in the right lung.  Few densities at the left lung base and near the anterior left seventh rib as described.  Findings probable represent overlying shadows but recommend further evaluation with a two view chest exam.   Original Report Authenticated By: Richarda Overlie, M.D.    Medications: . amLODipine  5 mg Oral Daily  . atorvastatin  80 mg Oral q1800  . cefTRIAXone (ROCEPHIN)  IV  1 g Intravenous Q24H  . doxycycline  100 mg Oral Q12H  . feeding supplement  237 mL Oral BID BM  . FLUoxetine  20 mg Oral Daily  . gabapentin  800 mg Oral TID  . heparin subcutaneous  5,000 Units Subcutaneous Q8H  . insulin aspart  0-20 Units Subcutaneous TID WC  . insulin aspart  0-5 Units Subcutaneous QHS  . insulin detemir  70 Units Subcutaneous BID  . levothyroxine  25 mcg Oral QAC breakfast  . metoCLOPramide  5 mg Oral TID WC  .  morphine injection  2 mg Intravenous Once  . oxybutynin  5 mg Oral Daily  . pantoprazole  40 mg Oral Daily  . triamcinolone cream   Topical BID    Assessment/Plan Right groin wound. Irrigation and debridement of the right groin [name], [name], 01/28/2013  INCISION AND DRAINAGE ABSCESS right groin abcess, 01/24/2013, [name], [name].  +MRSA on nasal swab  Wound culture: MODERATE GROUP B STREP(S.AGALACTIAE)ISOLATED  Still on antibiotics; day 6 Invanz yesterday next dose at 2300 today; and starting day 5 of Doxycycline today. No change in cultures.  No growth on blood cultures  DIABETES MELLITUS, TYPE II  - uncontrolled, with neuropathy  Severe renal  insuffiencey (creatinine up to 4.74, yesterday down to 2.75)  DEPRESSION  HTN (hypertension)  Hyponatremia  Anemia of chronic disease  Foley in place   Plan:  Ask Hydrotherapy to start seeing her tomorrow and we can do dressing change in AM together .  Continue wet to dry. She has a VQ scan pending.  Continue to follow with dressing/wound care.    LOS: 8 days    [name] 01/31/2013

## 2013-02-01 LAB — COMPREHENSIVE METABOLIC PANEL
AST: 28 U/L (ref 0–37)
Calcium: 9 mg/dL (ref 8.4–10.5)
Chloride: 104 mEq/L (ref 96–112)
Creatinine, Ser: 1.14 mg/dL — ABNORMAL HIGH (ref 0.50–1.10)
GFR calc Af Amer: 63 mL/min — ABNORMAL LOW (ref >90)
GFR calc non Af Amer: 55 mL/min — ABNORMAL LOW (ref >90)
Glucose, Bld: 84 mg/dL (ref 70–99)
Potassium: 4.1 mEq/L (ref 3.5–5.1)
Sodium: 143 mEq/L (ref 135–145)
Total Protein: 6.3 g/dL (ref 6.0–8.3)

## 2013-02-01 LAB — GLUCOSE, CAPILLARY
Glucose-Capillary: 163 mg/dL — ABNORMAL HIGH (ref 70–99)
Glucose-Capillary: 198 mg/dL — ABNORMAL HIGH (ref 70–99)

## 2013-02-01 LAB — CBC WITH DIFFERENTIAL/PLATELET
Basophils Relative: 0 % (ref 0–1)
Hemoglobin: 8.8 g/dL — ABNORMAL LOW (ref 12.0–15.0)
Lymphs Abs: 2.3 10*3/uL (ref 0.7–4.0)
Monocytes Relative: 10 % (ref 3–12)
Neutro Abs: 4.7 10*3/uL (ref 1.7–7.7)
Neutrophils Relative %: 59 % (ref 43–77)
RBC: 3.14 MIL/uL — ABNORMAL LOW (ref 3.87–5.11)

## 2013-02-01 NOTE — Progress Notes (Signed)
Physical Therapy Wound Evaluation Patient Details  Name: [name] MRN: [id number] Date of Birth: [birth year]  Today's Date: 02/01/2013 Time: 1130-1209 Time Calculation (min): 39 min  Subjective  Subjective: I hurts.Marland Kitchen...just want it over with Patient and Family Stated Goals: Want to go home  Pain Score: Pain Score:   7  Wound Assessment  Pressure Ulcer 12/06/12 Stage I -  Intact skin with non-blanchable redness of a localized area usually over a bony prominence. (Active)     Wound 01/23/13 Other (Comment) Groin Right (Active)  Site / Wound Assessment Clean;Bleeding;Pink;Yellow 02/01/2013 12:11 PM  % Wound base Red or Granulating 85% 02/01/2013 12:11 PM  % Wound base Yellow 15% 02/01/2013 12:11 PM  Peri-wound Assessment Intact 02/01/2013 12:11 PM  Wound Length (cm) 8 cm 02/01/2013 12:11 PM  Wound Width (cm) 2 cm 02/01/2013 12:11 PM  Wound Depth (cm) 3 cm 02/01/2013 12:11 PM  Tunneling (cm) 4 02/01/2013 12:11 PM  Undermining (cm) 5.5 02/01/2013 12:11 PM  Margins Attached edges (approximated) 02/01/2013 12:11 PM  Closure None 02/01/2013 12:11 PM  Drainage Amount Minimal 02/01/2013 12:11 PM  Drainage Description Serosanguineous 02/01/2013 12:11 PM  Treatment Cleansed;Debridement (Selective);Hydrotherapy (Pulse lavage);Packing (Saline gauze) 02/01/2013 12:11 PM  Dressing Type Gauze (Comment);ABD 02/01/2013 12:11 PM  Dressing Changed Changed 02/01/2013 12:11 PM  Dressing Status Intact;Clean;Dry 02/01/2013 12:11 PM     Incision 01/24/13 Groin Right (Active)  Site / Wound Assessment Clean;Dry 01/31/2013  8:30 PM  Margins Unattacted edges (unapproximated) 01/29/2013 10:29 AM  Drainage Amount Minimal 01/31/2013 10:00 AM  Drainage Description Serosanguineous 01/31/2013 10:00 AM  Treatment Other (Comment) 01/31/2013 10:00 AM  Dressing Type Gauze (Comment) 01/31/2013  8:30 PM  Dressing Clean;Dry;Intact 01/31/2013  8:30 PM     Incision 01/28/13 Abdomen Right (Active)  Dressing Type ABD;Gauze (Comment)  01/30/2013  8:30 AM  Dressing Clean;Dry;Intact 01/30/2013  8:30 AM   Hydrotherapy Pulsed lavage therapy - wound location: R groin Pulsed Lavage with Suction (psi): 4 psi Pulsed Lavage with Suction - Normal Saline Used: 1000 mL Pulsed Lavage Tip: Tip with splash shield Selective Debridement Selective Debridement - Location: R groin Selective Debridement - Tools Used: Forceps;Scissors Selective Debridement - Tissue Removed: yellow slough/ necrotic tissue   Wound Assessment and Plan  Wound Therapy - Assess/Plan/Recommendations Wound Therapy - Clinical Statement: pt can benefit from the PLS to flush the wound and tunnels to cleanse and hopefully decr the bacterial load to promote granulation Wound Therapy - Functional Problem List: decr mobility Factors Delaying/Impairing Wound Healing: Diabetes Mellitus;Vascular compromise Hydrotherapy Plan: Debridement;Dressing change;Electrical stimulation;Pulsatile lavage with suction Wound Therapy - Frequency: 6X / week Wound Therapy - Current Recommendations: Surgery consult;PT Wound Therapy - Follow Up Recommendations: Home health RN Wound Plan: cleanse, selective debridement, clean packing to promote healthy granulation  Wound Therapy Goals- Improve the function of patient's integumentary system by progressing the wound(s) through the phases of wound healing (inflammation - proliferation - remodeling) by: Decrease Necrotic Tissue to: 0% Decrease Necrotic Tissue - Progress: Goal set today Increase Granulation Tissue to: 100% Increase Granulation Tissue - Progress: Goal set today Improve Drainage Characteristics: Serous Improve Drainage Characteristics - Progress: Goal set today Time For Goal Achievement: 2 weeks Wound Therapy - Potential for Goals: Good  Goals will be updated until maximal potential achieved or discharge criteria met.  Discharge criteria: when goals achieved, discharge from hospital, MD decision/surgical intervention, no progress  towards goals, refusal/missing three consecutive treatments without notification or medical reason.  GP     Zhania Shaheen, Eliseo Gum  02/01/2013, 12:22 PM 02/01/2013  [place], PT [phone]  (pager)

## 2013-02-01 NOTE — Progress Notes (Signed)
[facility] PROGRESS NOTE  [name]:[id number] DOB: 1961/08/09 DOA: 01/23/2013 PCP: [name], MD  Assessment/Plan: Abscess of groin, right  -Status post I&D 6/6.  -Blood cultures from 5/31 shows no growth. Strep noted from abscess.  -continue doxycycline and rocephin for now  -non toxic in appearance  -surgery took her again to OR on 6/10 for further I/D/W  ARF  -Resolved  -Likely secondary to contrast nephropathy  -avoiding nephrotoxic agents  -foley place to follow urine output closely  -renal US essentially normal  -Cr stable  Uncontrolled DIABETES MELLITUS, TYPE II  -HBA1C is 12.9.  -will continue levemir and SSI.  -Currently stable  DEPRESSION  Appears stable at this time, normal affect  -Continue Fluoxetine.  PERIPHERAL NEUROPATHY  Complication of diabetes, continue Neurontin per home dose  Hyponatremia  -Most likely due to hypovolemia.  -resolved HTN (hypertension)  - improved -Diuretic on hold as above  -On PRN hydralazine  -Ca channel blocker started  -Continue to monitor closely.  Anemia of chronic disease  Hg and Hct stable and at pt's baseline.  Will monitor  Mild hyperkalemia:  -normalized  Hypoxia:  -Had been O2 dependent over the past several days -abg, cxr, and VQ scan unremarkable - Suspect possible hypoxia secondary to overmedication w/ narcs prior to dressing changes - Would avoid over medication w/ pain meds - Sats currently appropriate on RA  Code Status: Full Family Communication: Pending (indicate person spoken with, relationship, and if by phone, the number) Disposition Plan: Pending   Consultants: Nephrology General surgery  Procedures: Wound I/D on 6/6  R Groin Wound I/D 6/10 VQ on 01/31/13 negative for PE  Antibiotics: IV Vanc 6/5>>>6/8  IV Ertapenem 6/5>>> 01/30/13  Doxycycline 6/8>>>  Rocephin 01/30/13>>>  HPI/Subjective: Reports feeling well. Complains of pain w/ dressing  changes  Objective: Filed Vitals:   01/31/13 1500 01/31/13 2255 02/01/13 0715 02/01/13 0718  BP: 149/61 125/65  130/72  Pulse: 65 60  55  Temp: 99.4 F (37.4 C) 97.8 F (36.6 C)  98.8 F (37.1 C)  TempSrc:      Resp: 18 18  18   Height:      Weight:   106.686 kg (235 lb 3.2 oz)   SpO2: 95% 96%  99%    Intake/Output Summary (Last 24 hours) at 02/01/13 1017 Last data filed at 01/31/13 1900  Gross per 24 hour  Intake    480 ml  Output    200 ml  Net    280 ml   Filed Weights   01/30/13 0500 01/31/13 0702 02/01/13 0715  Weight: 106.822 kg (235 lb 8 oz) 106.824 kg (235 lb 8.1 oz) 106.686 kg (235 lb 3.2 oz)    Exam:   General:  Awake, in NAD  Cardiovascular: regular, s1, s2  Respiratory: normal resp effort, no wheezing  Abdomen: soft, nondistended  Musculoskeletal: perfused, 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, s/p RLE amputation   Data Reviewed: Basic Metabolic Panel:  Recent Labs Lab 01/27/13 0405 01/28/13 0445 01/28/13 0908 01/29/13 0520 01/30/13 0425 02/01/13 0450  NA 134* 137 142 141 140 143  K 4.8 5.4* 3.8 4.0 4.1 4.1  CL 100 105 105 105 103 104  CO2 22 22 25 24 28 27   GLUCOSE 329* 116* 90 150* 123* 84  BUN 49* 40* 38* 26* 20 18  CREATININE 2.75* 1.74* 1.73* 1.37* 1.18* 1.14*  CALCIUM 8.8 8.8 8.8 8.4 8.5 9.0  PHOS 5.0*  --  4.2  --   --   --  Liver Function Tests:  Recent Labs Lab 01/27/13 0405 01/28/13 0908 01/30/13 0425 02/01/13 0450  AST  --   --  72* 28  ALT  --   --  236* 106*  ALKPHOS  --   --  244* 222*  BILITOT  --   --  0.2* 0.2*  PROT  --   --  6.3 6.3  ALBUMIN 1.9* 1.9* 1.8* 1.9*   No results found for this basename: LIPASE, AMYLASE,  in the last 168 hours No results found for this basename: AMMONIA,  in the last 168 hours CBC:  Recent Labs Lab 01/26/13 0423 01/28/13 0745 02/01/13 0450  WBC 8.8 9.2 8.0  NEUTROABS  --   --  4.7  HGB 9.8* 9.3* 8.8*  HCT 29.2* 27.9* 27.0*  MCV 83.0 83.0 86.0  PLT 267 266 251   Cardiac Enzymes: No  results found for this basename: CKTOTAL, CKMB, CKMBINDEX, TROPONINI,  in the last 168 hours BNP (last 3 results) No results found for this basename: PROBNP,  in the last 8760 hours CBG:  Recent Labs Lab 01/31/13 1128 01/31/13 1609 01/31/13 2156 02/01/13 0655 02/01/13 0912  GLUCAP 140* 123* 98 81 151*    Recent Results (from the past 240 hour(s))  SURGICAL PCR SCREEN     Status: Abnormal   Collection Time    01/24/13  5:54 AM      Result Value Range Status   MRSA, PCR NEGATIVE  NEGATIVE Final   Staphylococcus aureus POSITIVE (*) NEGATIVE Final   Comment:            The Xpert SA Assay (FDA     approved for NASAL specimens     in patients over 67 years of age),     is one component of     a comprehensive surveillance     program.  Test performance has     been validated by [employer] for patients greater     than or equal to 81 year old.     It is not intended     to diagnose infection nor to     guide or monitor treatment.  CULTURE, ROUTINE-ABSCESS     Status: None   Collection Time    01/27/13  3:50 PM      Result Value Range Status   Specimen Description ABSCESS GROIN RIGHT   Final   Special Requests Normal   Final   Gram Stain     Final   Value: FEW WBC PRESENT,BOTH PMN AND MONONUCLEAR     NO SQUAMOUS EPITHELIAL CELLS SEEN     NO ORGANISMS SEEN   Culture     Final   Value: FEW GROUP B STREP(S.AGALACTIAE)ISOLATED     Note: TESTING AGAINST S. AGALACTIAE NOT ROUTINELY PERFORMED DUE TO PREDICTABILITY OF AMP/PEN/VAN SUSCEPTIBILITY.   Report Status 01/30/2013 FINAL   Final  URINE CULTURE     Status: None   Collection Time    01/30/13  2:37 PM      Result Value Range Status   Specimen Description URINE, CATHETERIZED   Final   Special Requests Unasyn, doxycycline   Final   Culture  Setup Time 01/30/2013 15:18   Final   Colony Count NO GROWTH   Final   Culture NO GROWTH   Final   Report Status 01/31/2013 FINAL   Final     Studies: Nm Pulmonary Perf And  Vent  01/31/2013   *RADIOLOGY REPORT*  Clinical Data:  Short of breath, concern for pulmonary embolism.  NUCLEAR MEDICINE VENTILATION - PERFUSION LUNG SCAN  Technique:  Ventilation images were obtained in multiple projections using inhaled aerosol technetium 99 M DTPA.  Perfusion images were obtained in multiple projections after intravenous injection of Tc-63m MAA.  Radiopharmaceuticals:  Tc-22m DTPA aerosol and six mCi Tc-39m MAA.  Comparison: Chest radiograph 01/30/2013  Findings:  Ventilation:   No focal ventilation defect.  Perfusion:   No wedge shaped peripheral perfusion defects to suggest acute pulmonary embolism  IMPRESSION: Very low probability for acute pulmonary embolism.   Original Report Authenticated By: Genevive Bi, M.D.   Dg Chest Port 1 View  01/30/2013   *RADIOLOGY REPORT*  Clinical Data: Shortness of breath and hypoxia.  PORTABLE CHEST - 1 VIEW  Comparison: 12/05/2012  Findings: Portable view of the chest was obtained.  Heart size is upper limits normal but may be related to the technique.  Slightly prominent lung markings without focal airspace disease or pulmonary edema. Trachea midline.  Slightly increased density along the anterior aspect of the left seventh rib probably represent overlying shadows and similar to the previous examination.  There may be subsegmental atelectasis in the medial right lower chest.  IMPRESSION: No focal chest disease.  Probable atelectasis in the right lung.  Few densities at the left lung base and near the anterior left seventh rib as described.  Findings probable represent overlying shadows but recommend further evaluation with a two view chest exam.   Original Report Authenticated By: Richarda Overlie, M.D.    Scheduled Meds: . amLODipine  5 mg Oral Daily  . atorvastatin  80 mg Oral q1800  . cefTRIAXone (ROCEPHIN)  IV  1 g Intravenous Q24H  . doxycycline  100 mg Oral Q12H  . feeding supplement  237 mL Oral BID BM  . FLUoxetine  20 mg Oral Daily   . gabapentin  800 mg Oral TID  . heparin subcutaneous  5,000 Units Subcutaneous Q8H  . insulin aspart  0-20 Units Subcutaneous TID WC  . insulin aspart  0-5 Units Subcutaneous QHS  . insulin detemir  70 Units Subcutaneous BID  . levothyroxine  25 mcg Oral QAC breakfast  . metoCLOPramide  5 mg Oral TID WC  .  morphine injection  2 mg Intravenous Once  . oxybutynin  5 mg Oral Daily  . pantoprazole  40 mg Oral Daily  . triamcinolone cream   Topical BID   Continuous Infusions: . sodium chloride 75 mL/hr at 02/01/13 0645  . lactated ringers 50 mL/hr at 01/28/13 1610    Principal Problem:   Abscess of groin, right Active Problems:   Diabetes mellitus type 2, uncontrolled, with complications   DEPRESSION   PERIPHERAL NEUROPATHY   HYPERCHOLESTEROLEMIA   Hyponatremia   HTN (hypertension)   Leukocytosis   Anemia of chronic disease   Hypothyroidism   ARF (acute renal failure)   Proteinuria    Time spent:    Willet Schleifer K  [facility] Pager [phone]. If 7PM-7AM, please contact night-coverage at [email or web address], password [facility] 02/01/2013, 10:17 AM  LOS: 9 days

## 2013-02-01 NOTE — Progress Notes (Signed)
4 Days Post-Op  Subjective: She is going thru hydrotherapy and repacking.  She has a hard time with any manipulation of her leg.  Objective: Vital signs in last 24 hours: Temp:  [97.8 F (36.6 C)-99.4 F (37.4 C)] 98.8 F (37.1 C) (06/14 0718) Pulse Rate:  [55-65] 55 (06/14 0718) Resp:  [18] 18 (06/14 0718) BP: (125-149)/(61-72) 130/72 mmHg (06/14 0718) SpO2:  [95 %-99 %] 99 % (06/14 0718) Weight:  [106.686 kg (235 lb 3.2 oz)] 106.686 kg (235 lb 3.2 oz) (06/14 0715) Last BM Date: 01/31/13 Afebrile VSS, sats are better. Creatinine is much imporved VQ scan: Low probability Intake/Output from previous day: 06/13 0701 - 06/14 0700 In: 720 [P.O.:720] Out: 200 [Urine:200] Intake/Output this shift:    General appearance: alert and no distress Groin wound:  She just had hydro Rx, and although difficult she tolerated it well for her.  Site is clean and no necrotic tissue.  Lab Results:   Recent Labs  02/01/13 0450  WBC 8.0  HGB 8.8*  HCT 27.0*  PLT 251    BMET  Recent Labs  01/30/13 0425 02/01/13 0450  NA 140 143  K 4.1 4.1  CL 103 104  CO2 28 27  GLUCOSE 123* 84  BUN 20 18  CREATININE 1.18* 1.14*  CALCIUM 8.5 9.0   PT/INR No results found for this basename: LABPROT, INR,  in the last 72 hours   Recent Labs Lab 01/27/13 0405 01/28/13 0908 01/30/13 0425 02/01/13 0450  AST  --   --  72* 28  ALT  --   --  236* 106*  ALKPHOS  --   --  244* 222*  BILITOT  --   --  0.2* 0.2*  PROT  --   --  6.3 6.3  ALBUMIN 1.9* 1.9* 1.8* 1.9*     Lipase     Component Value Date/Time   LIPASE 38 12/05/2012 0317     Studies/Results: Nm Pulmonary Perf And Vent  01/31/2013   *RADIOLOGY REPORT*  Clinical Data:  Short of breath, concern for pulmonary embolism.  NUCLEAR MEDICINE VENTILATION - PERFUSION LUNG SCAN  Technique:  Ventilation images were obtained in multiple projections using inhaled aerosol technetium 99 M DTPA.  Perfusion images were obtained in multiple  projections after intravenous injection of Tc-17m MAA.  Radiopharmaceuticals:  Tc-9m DTPA aerosol and six mCi Tc-38m MAA.  Comparison: Chest radiograph 01/30/2013  Findings:  Ventilation:   No focal ventilation defect.  Perfusion:   No wedge shaped peripheral perfusion defects to suggest acute pulmonary embolism  IMPRESSION: Very low probability for acute pulmonary embolism.   Original Report Authenticated By: Genevive Bi, M.D.   Dg Chest Port 1 View  01/30/2013   *RADIOLOGY REPORT*  Clinical Data: Shortness of breath and hypoxia.  PORTABLE CHEST - 1 VIEW  Comparison: 12/05/2012  Findings: Portable view of the chest was obtained.  Heart size is upper limits normal but may be related to the technique.  Slightly prominent lung markings without focal airspace disease or pulmonary edema. Trachea midline.  Slightly increased density along the anterior aspect of the left seventh rib probably represent overlying shadows and similar to the previous examination.  There may be subsegmental atelectasis in the medial right lower chest.  IMPRESSION: No focal chest disease.  Probable atelectasis in the right lung.  Few densities at the left lung base and near the anterior left seventh rib as described.  Findings probable represent overlying shadows but recommend further evaluation with  a two view chest exam.   Original Report Authenticated By: Richarda Overlie, M.D.    Medications: . amLODipine  5 mg Oral Daily  . atorvastatin  80 mg Oral q1800  . cefTRIAXone (ROCEPHIN)  IV  1 g Intravenous Q24H  . doxycycline  100 mg Oral Q12H  . feeding supplement  237 mL Oral BID BM  . FLUoxetine  20 mg Oral Daily  . gabapentin  800 mg Oral TID  . heparin subcutaneous  5,000 Units Subcutaneous Q8H  . insulin aspart  0-20 Units Subcutaneous TID WC  . insulin aspart  0-5 Units Subcutaneous QHS  . insulin detemir  70 Units Subcutaneous BID  . levothyroxine  25 mcg Oral QAC breakfast  . metoCLOPramide  5 mg Oral TID WC  .   morphine injection  2 mg Intravenous Once  . oxybutynin  5 mg Oral Daily  . pantoprazole  40 mg Oral Daily  . triamcinolone cream   Topical BID    Assessment/Plan Right groin wound. Irrigation and debridement of the right groin Jenne Campus, MD, 01/28/2013  INCISION AND DRAINAGE ABSCESS right groin abcess, 01/24/2013, [place]is Pu. Cornett, MD.  +MRSA on nasal swab  Wound culture: MODERATE GROUP B STREP(S.AGALACTIAE)ISOLATED  Still on antibiotics; day 6 Invanz yesterday next dose at 2300 today; and starting day 5 of Doxycycline today. No change in cultures.  No growth on blood cultures  DIABETES MELLITUS, TYPE II  - uncontrolled, with neuropathy  Severe renal insuffiencey (creatinine up to 4.74, yesterday down to 2.75)  DEPRESSION  HTN (hypertension)  Hyponatremia  Anemia of chronic disease  Foley out but incontinent. Hypoxia  Plan:  We will look at site again Monday.  She needs to continue BID dressing changes.       LOS: 9 days    Alease Fait 02/01/2013

## 2013-02-01 NOTE — Progress Notes (Signed)
Continue wound care. ?

## 2013-02-02 LAB — GLUCOSE, CAPILLARY
Glucose-Capillary: 232 mg/dL — ABNORMAL HIGH (ref 70–99)
Glucose-Capillary: 195 mg/dL — ABNORMAL HIGH (ref 70–99)
Glucose-Capillary: 177 mg/dL — ABNORMAL HIGH (ref 70–99)

## 2013-02-02 NOTE — Progress Notes (Signed)
[facility] PROGRESS NOTE  [name] AVW:[id number] DOB: 08-Apr-1961 DOA: 01/23/2013 PCP: [name], MD  Assessment/Plan: Abscess of groin, right  -Status post I&D 6/6.  -Blood cultures from 5/31 shows no growth. Strep noted from abscess.  -on doxycycline and rocephin ARF  -Resolved  -Likely secondary to contrast nephropathy  -avoiding nephrotoxic agents  -foley place to follow urine output closely  -renal US essentially normal  Uncontrolled DIABETES MELLITUS, TYPE II  -HBA1C is 12.9.  -will continue levemir and SSI.  -Currently stable  DEPRESSION  Appears stable at this time, normal affect  -Continue Fluoxetine.  PERIPHERAL NEUROPATHY  Complication of diabetes, continue Neurontin per home dose  Hyponatremia  -Most likely due to hypovolemia.  -resolved  HTN (hypertension)  - improved  -Diuretic on hold as above  -On PRN hydralazine  -Ca channel blocker started  -SBP just over 100 this AM. If pt remains w/ low bp, may consider stopping bp med -Continue to monitor closely Anemia of chronic disease  Hg and Hct stable and at pt's baseline.  Will monitor  Mild hyperkalemia:  -normalized  Hypoxia:  -Had been O2 dependent over the past several days  -abg, cxr, and VQ scan unremarkable  - Suspect possible hypoxia secondary to overmedication w/ narcs prior to dressing changes  - Would avoid over medication w/ pain meds  - Sats remains appropriate on minimal O2 support  Code Status: Full Family Communication: Pt in room (indicate person spoken with, relationship, and if by phone, the number) Disposition Plan: Pending   Consultants: Nephrology  General surgery  Procedures: Wound I/D on 6/6  R Groin Wound I/D 6/10  VQ on 01/31/13 negative for PE  Antibiotics: IV Vanc 6/5>>>6/8  IV Ertapenem 6/5>>> 01/30/13  Doxycycline 6/8>>>  Rocephin 01/30/13>>>  HPI/Subjective: Complains of groin pain w/ dressing changes. Family reports periods of apnea  following narcotic use.  Objective: Filed Vitals:   02/01/13 1400 02/01/13 2233 02/02/13 0500 02/02/13 0700  BP: 134/61 141/54  108/33  Pulse: 63 67  55  Temp: 98.1 F (36.7 C) 97.5 F (36.4 C)  97.9 F (36.6 C)  TempSrc:  Oral  Oral  Resp: 17 16  18   Height:      Weight:   109.77 kg (242 lb)   SpO2: 99% 92%  96%    Intake/Output Summary (Last 24 hours) at 02/02/13 1046 Last data filed at 02/02/13 0600  Gross per 24 hour  Intake   1510 ml  Output      0 ml  Net   1510 ml   Filed Weights   01/31/13 0702 02/01/13 0715 02/02/13 0500  Weight: 106.824 kg (235 lb 8.1 oz) 106.686 kg (235 lb 3.2 oz) 109.77 kg (242 lb)    Exam:   General:  Drowsy, in nad  Cardiovascular: regular, s1, s2  Respiratory: shallow breaths, no wheezing or crackles  Abdomen: soft, nondistended  Musculoskeletal: perfused, no clubbing   Data Reviewed: Basic Metabolic Panel:  Recent Labs Lab 01/27/13 0405 01/28/13 0445 01/28/13 0908 01/29/13 0520 01/30/13 0425 02/01/13 0450  NA 134* 137 142 141 140 143  K 4.8 5.4* 3.8 4.0 4.1 4.1  CL 100 105 105 105 103 104  CO2 22 22 25 24 28 27   GLUCOSE 329* 116* 90 150* 123* 84  BUN 49* 40* 38* 26* 20 18  CREATININE 2.75* 1.74* 1.73* 1.37* 1.18* 1.14*  CALCIUM 8.8 8.8 8.8 8.4 8.5 9.0  PHOS 5.0*  --  4.2  --   --   --  Liver Function Tests:  Recent Labs Lab 01/27/13 0405 01/28/13 0908 01/30/13 0425 02/01/13 0450  AST  --   --  72* 28  ALT  --   --  236* 106*  ALKPHOS  --   --  244* 222*  BILITOT  --   --  0.2* 0.2*  PROT  --   --  6.3 6.3  ALBUMIN 1.9* 1.9* 1.8* 1.9*   No results found for this basename: LIPASE, AMYLASE,  in the last 168 hours No results found for this basename: AMMONIA,  in the last 168 hours CBC:  Recent Labs Lab 01/28/13 0745 02/01/13 0450  WBC 9.2 8.0  NEUTROABS  --  4.7  HGB 9.3* 8.8*  HCT 27.9* 27.0*  MCV 83.0 86.0  PLT 266 251   Cardiac Enzymes: No results found for this basename: CKTOTAL, CKMB,  CKMBINDEX, TROPONINI,  in the last 168 hours BNP (last 3 results) No results found for this basename: PROBNP,  in the last 8760 hours CBG:  Recent Labs Lab 02/01/13 0912 02/01/13 1130 02/01/13 1703 02/01/13 2237 02/02/13 0700  GLUCAP 151* 132* 163* 198* 232*    Recent Results (from the past 240 hour(s))  SURGICAL PCR SCREEN     Status: Abnormal   Collection Time    01/24/13  5:54 AM      Result Value Range Status   MRSA, PCR NEGATIVE  NEGATIVE Final   Staphylococcus aureus POSITIVE (*) NEGATIVE Final   Comment:            The Xpert SA Assay (FDA     approved for NASAL specimens     in patients over 52 years of age),     is one component of     a comprehensive surveillance     program.  Test performance has     been validated by [employer] for patients greater     than or equal to 27 year old.     It is not intended     to diagnose infection nor to     guide or monitor treatment.  CULTURE, ROUTINE-ABSCESS     Status: None   Collection Time    01/27/13  3:50 PM      Result Value Range Status   Specimen Description ABSCESS GROIN RIGHT   Final   Special Requests Normal   Final   Gram Stain     Final   Value: FEW WBC PRESENT,BOTH PMN AND MONONUCLEAR     NO SQUAMOUS EPITHELIAL CELLS SEEN     NO ORGANISMS SEEN   Culture     Final   Value: FEW GROUP B STREP(S.AGALACTIAE)ISOLATED     Note: TESTING AGAINST S. AGALACTIAE NOT ROUTINELY PERFORMED DUE TO PREDICTABILITY OF AMP/PEN/VAN SUSCEPTIBILITY.   Report Status 01/30/2013 FINAL   Final  URINE CULTURE     Status: None   Collection Time    01/30/13  2:37 PM      Result Value Range Status   Specimen Description URINE, CATHETERIZED   Final   Special Requests Unasyn, doxycycline   Final   Culture  Setup Time 01/30/2013 15:18   Final   Colony Count NO GROWTH   Final   Culture NO GROWTH   Final   Report Status 01/31/2013 FINAL   Final     Studies: Nm Pulmonary Perf And Vent  01/31/2013   *RADIOLOGY REPORT*  Clinical  Data:  Short of breath, concern for pulmonary embolism.  NUCLEAR MEDICINE VENTILATION - PERFUSION LUNG SCAN  Technique:  Ventilation images were obtained in multiple projections using inhaled aerosol technetium 99 M DTPA.  Perfusion images were obtained in multiple projections after intravenous injection of Tc-24m MAA.  Radiopharmaceuticals:  Tc-34m DTPA aerosol and six mCi Tc-75m MAA.  Comparison: Chest radiograph 01/30/2013  Findings:  Ventilation:   No focal ventilation defect.  Perfusion:   No wedge shaped peripheral perfusion defects to suggest acute pulmonary embolism  IMPRESSION: Very low probability for acute pulmonary embolism.   Original Report Authenticated By: Genevive Bi, M.D.    Scheduled Meds: . amLODipine  5 mg Oral Daily  . atorvastatin  80 mg Oral q1800  . cefTRIAXone (ROCEPHIN)  IV  1 g Intravenous Q24H  . doxycycline  100 mg Oral Q12H  . feeding supplement  237 mL Oral BID BM  . FLUoxetine  20 mg Oral Daily  . gabapentin  800 mg Oral TID  . heparin subcutaneous  5,000 Units Subcutaneous Q8H  . insulin aspart  0-20 Units Subcutaneous TID WC  . insulin aspart  0-5 Units Subcutaneous QHS  . insulin detemir  70 Units Subcutaneous BID  . levothyroxine  25 mcg Oral QAC breakfast  . metoCLOPramide  5 mg Oral TID WC  .  morphine injection  2 mg Intravenous Once  . oxybutynin  5 mg Oral Daily  . pantoprazole  40 mg Oral Daily  . triamcinolone cream   Topical BID   Continuous Infusions: . sodium chloride 75 mL/hr at 02/02/13 0600  . lactated ringers 50 mL/hr at 01/28/13 1610    Principal Problem:   Abscess of groin, right Active Problems:   Diabetes mellitus type 2, uncontrolled, with complications   DEPRESSION   PERIPHERAL NEUROPATHY   HYPERCHOLESTEROLEMIA   Hyponatremia   HTN (hypertension)   Leukocytosis   Anemia of chronic disease   Hypothyroidism   ARF (acute renal failure)   Proteinuria    Time spent:    Jullie Arps K  [facility] Pager [phone]. If 7PM-7AM, please contact night-coverage at [email or web address], password [facility] 02/02/2013, 10:46 AM  LOS: 10 days

## 2013-02-02 NOTE — Progress Notes (Deleted)
Pt was noted to be hypoxic earlier in the afternoon w/ sats in the 80's. Pt did not complain of SOB but did appear to have some labored breathing. CXR obtained, which revealed increased vascular markings suggestive of pulm edema by my read. 40mg  of IV lasix was given with improved o2 sats within 30-45min. Home diuretic meds reordered for tomorrow. Will follow renal fx closely. Again, Nephrology is on board.

## 2013-02-03 ENCOUNTER — Inpatient Hospital Stay (HOSPITAL_COMMUNITY): Payer: Medicaid Other

## 2013-02-03 LAB — CBC
HCT: 26.2 % — ABNORMAL LOW (ref 36.0–46.0)
Hemoglobin: 8.5 g/dL — ABNORMAL LOW (ref 12.0–15.0)
MCH: 27.8 pg (ref 26.0–34.0)
MCHC: 32.4 g/dL (ref 30.0–36.0)
MCV: 85.6 fL (ref 78.0–100.0)

## 2013-02-03 LAB — BASIC METABOLIC PANEL
BUN: 40 mg/dL — ABNORMAL HIGH (ref 6–23)
BUN: 42 mg/dL — ABNORMAL HIGH (ref 6–23)
Calcium: 8.5 mg/dL (ref 8.4–10.5)
Chloride: 100 mEq/L (ref 96–112)
Creatinine, Ser: 2.29 mg/dL — ABNORMAL HIGH (ref 0.50–1.10)
GFR calc Af Amer: 27 mL/min — ABNORMAL LOW (ref >90)
GFR calc non Af Amer: 24 mL/min — ABNORMAL LOW (ref >90)
GFR calc non Af Amer: 24 mL/min — ABNORMAL LOW (ref >90)
Glucose, Bld: 189 mg/dL — ABNORMAL HIGH (ref 70–99)
Potassium: 5.5 mEq/L — ABNORMAL HIGH (ref 3.5–5.1)

## 2013-02-03 LAB — URINE MICROSCOPIC-ADD ON

## 2013-02-03 LAB — URINALYSIS, ROUTINE W REFLEX MICROSCOPIC
Bilirubin Urine: NEGATIVE
Glucose, UA: NEGATIVE mg/dL
Ketones, ur: NEGATIVE mg/dL
Leukocytes, UA: NEGATIVE
pH: 5 (ref 5.0–8.0)

## 2013-02-03 LAB — GLUCOSE, CAPILLARY: Glucose-Capillary: 177 mg/dL — ABNORMAL HIGH (ref 70–99)

## 2013-02-03 MED ORDER — SODIUM POLYSTYRENE SULFONATE 15 GM/60ML PO SUSP
15.0000 g | Freq: Once | ORAL | Status: AC
  Administered 2013-02-03: 15 g via ORAL
  Filled 2013-02-03 (×2): qty 60

## 2013-02-03 NOTE — Progress Notes (Signed)
Discussed with PA  Devonte Migues M. Safiyyah Vasconez, MD, FACS General, Bariatric, & Minimally Invasive Surgery [facility]  

## 2013-02-03 NOTE — Progress Notes (Signed)
Pt has been drowsy most of shift. Has not received any pain medications since the a.m. Pt responds to questions but she quickly falls asleep when talking. She did insist on a second dinner and ate all of her food. [place] at 2 liters, however pt removes it frequently.

## 2013-02-03 NOTE — Progress Notes (Signed)
Physical Therapy Treatment Patient Details Name: [name] MRN: [id number] DOB: [birth year] Today's Date: 02/03/2013 Time: 1206-1220 PT Time Calculation (min): 14 min  PT Assessment / Plan / Recommendation Comments on Treatment Session  Attempted to work on transfers and standing with the prostheis, but quickly found out that pt's stump was too edematous even without any ply socks to fit the prosthesis.  Asked pt and her Mom to bring in her shrinker sock to pull down the size of her leg.  Pt needs some rehab to relearn how best to mobilize.    Follow Up Recommendations  SNF;Supervision/Assistance - 24 hour     Does the patient have the potential to tolerate intense rehabilitation     Barriers to Discharge        Equipment Recommendations  None recommended by PT    Recommendations for Other Services OT consult  Frequency Min 3X/week   Plan Discharge plan remains appropriate;Frequency remains appropriate    Precautions / Restrictions Precautions Precautions: Fall Restrictions Weight Bearing Restrictions: No   Pertinent Vitals/Pain     Mobility  Bed Mobility Bed Mobility: Supine to Sit;Sitting - Scoot to Edge of Bed;Sit to Supine Supine to Sit: 4: Min guard;With rails Sitting - Scoot to Edge of Bed: 4: Min guard Sit to Supine: 4: Min guard;HOB flat Details for Bed Mobility Assistance: Needs extra time and use of the rail.  No assist needed. Transfers Transfers: Sit to Stand;Stand to Sit;Squat Pivot Transfers Sit to Stand: 3: Mod assist;With upper extremity assist;From bed;Other (comment) (with prosthesis that wouldn't fully seat.) Stand to Sit: 4: Min assist;To bed Squat Pivot Transfers: 3: Mod assist;With upper extremity assistance;Other (comment) (to/from bsc) Details for Transfer Assistance: pt unable to problem solve the best way to transfer with 1 leg.  cued and assisted her to transfer properly. Ambulation/Gait Ambulation/Gait Assistance: Not tested  (comment);Other (comment) (due to the prosthesis would not seat properly) Stairs: No Wheelchair Mobility Wheelchair Mobility: No    Exercises     PT Diagnosis:    PT Problem List:   PT Treatment Interventions:     PT Goals Acute Rehab PT Goals Time For Goal Achievement: 02/09/13 Potential to Achieve Goals: Good Pt will go Supine/Side to Sit: with modified independence PT Goal: Supine/Side to Sit - Progress: Progressing toward goal Pt will go Sit to Supine/Side: with modified independence PT Goal: Sit to Supine/Side - Progress: Progressing toward goal PT Goal: Sit to Stand - Progress: Progressing toward goal Pt will go Stand to Sit: with modified independence PT Goal: Stand to Sit - Progress: Progressing toward goal Pt will Ambulate: >150 feet;with supervision;with least restrictive assistive device PT Goal: Ambulate - Progress: Not progressing Pt will Go Up / Down Stairs: 6-9 stairs;with supervision;with least restrictive assistive device PT Goal: Up/Down Stairs - Progress: Not progressing  Visit Information  Last PT Received On: 02/03/13 Assistance Needed: +2    Subjective Data  Subjective:  I can't do this the way you have it..,..   Cognition  Cognition Arousal/Alertness: Awake/alert Behavior During Therapy: Anxious Overall Cognitive Status: Impaired/Different from baseline Area of Impairment: Attention;Safety/judgement;Problem solving Current Attention Level: Focused    Balance  Static Sitting Balance Static Sitting - Balance Support: Feet unsupported;Left upper extremity supported;Right upper extremity supported Static Sitting - Level of Assistance: Other (comment) (min guard assist)  End of Session PT - End of Session Equipment Utilized During Treatment: Other (comment) (attempted to donn prosthesis without success.) Activity Tolerance: Patient limited by fatigue;Patient limited by  pain;Other (comment) (PA needed pt to lie back down for dressing change) Patient  left: in bed;with call bell/phone within reach Nurse Communication: Mobility status   GP     Sewell Pitner, Eliseo Gum 02/03/2013, 1:54 PM 02/03/2013  Wahneta Bing, PT [phone]  (pager)

## 2013-02-03 NOTE — Progress Notes (Addendum)
[facility] PROGRESS NOTE  [name] XBJ:[id number] DOB: 1961-07-05 DOA: 01/23/2013 PCP: [name], MD  Assessment/Plan: Abscess of groin, right  -Status post I&D 6/6.  -Blood cultures from 5/31 shows no growth. GBS noted from abscess.  -had been on doxycycline and rocephin  -consider transitioning to monotherapy based on culture results ARF  -Resolved  -Likely secondary to contrast nephropathy  -avoiding nephrotoxic agents  -foley place to follow urine output closely  -renal US essentially normal  Uncontrolled DIABETES MELLITUS, TYPE II  -HBA1C is 12.9.  -will continue levemir and SSI.  -Currently stable  DEPRESSION  Appears stable at this time, normal affect  -Continue Fluoxetine.  PERIPHERAL NEUROPATHY  Complication of diabetes, continue Neurontin per home dose  Hyponatremia  -Most likely due to hypovolemia.  -resolved  HTN (hypertension)  - improved  -Diuretic on hold as above  -On PRN hydralazine  -Ca channel blocker started  -SBP just over 100 this AM. If pt remains w/ low bp, may consider stopping bp med  -Continue to monitor closely  Anemia of chronic disease  Hg and Hct stable and at pt's baseline.  Will monitor  Mild hyperkalemia:  -normalized  Hypoxia:  -Had been O2 dependent over the past several days  -abg, cxr, and VQ scan unremarkable  - Suspect possible hypoxia secondary to overmedication w/ narcs prior to dressing changes  - Would avoid over medication w/ pain meds  - Sats remains appropriate on minimal O2 support  Code Status: Full Family Communication: Pt in room (indicate person spoken with, relationship, and if by phone, the number) Disposition Plan: Pending   Consultants:  Nephrology  General surgery  Procedures: Wound I/D on 6/6  R Groin Wound I/D 6/10  VQ on 01/31/13 negative for PE  Antibiotics: IV Vanc 6/5>>>6/8  IV Ertapenem 6/5>>> 01/30/13  Doxycycline 6/8>>> Rocephin  01/30/13>>>02/03/13  HPI/Subjective: No complaints. Feels "better."  Objective: Filed Vitals:   02/02/13 1536 02/02/13 2009 02/03/13 0500 02/03/13 0605  BP: 141/68 126/63  121/87  Pulse: 57 56  60  Temp: 98.9 F (37.2 C) 98 F (36.7 C)  98.8 F (37.1 C)  TempSrc: Oral Oral  Oral  Resp: 18 18  18   Height:      Weight:   113.399 kg (250 lb)   SpO2: 95% 95%  94%    Intake/Output Summary (Last 24 hours) at 02/03/13 0850 Last data filed at 02/03/13 0552  Gross per 24 hour  Intake   2590 ml  Output      0 ml  Net   2590 ml   Filed Weights   02/01/13 0715 02/02/13 0500 02/03/13 0500  Weight: 106.686 kg (235 lb 3.2 oz) 109.77 kg (242 lb) 113.399 kg (250 lb)    Exam:   General:  Awake, in nad  Cardiovascular: regular, s1, s2  Respiratory: normal resp effort, no wheezing  Abdomen: soft, nondistended  Musculoskeletal: perfused, no clubbing   Data Reviewed: Basic Metabolic Panel:  Recent Labs Lab 01/28/13 0908 01/29/13 0520 01/30/13 0425 02/01/13 0450 02/03/13 0750  NA 142 141 140 143 137  K 3.8 4.0 4.1 4.1 5.8*  CL 105 105 103 104 100  CO2 25 24 28 27 25   GLUCOSE 90 150* 123* 84 198*  BUN 38* 26* 20 18 40*  CREATININE 1.73* 1.37* 1.18* 1.14* 2.29*  CALCIUM 8.8 8.4 8.5 9.0 8.5  PHOS 4.2  --   --   --   --    Liver Function Tests:  Recent Labs Lab 01/28/13 0908 01/30/13 0425 02/01/13 0450  AST  --  72* 28  ALT  --  236* 106*  ALKPHOS  --  244* 222*  BILITOT  --  0.2* 0.2*  PROT  --  6.3 6.3  ALBUMIN 1.9* 1.8* 1.9*   No results found for this basename: LIPASE, AMYLASE,  in the last 168 hours No results found for this basename: AMMONIA,  in the last 168 hours CBC:  Recent Labs Lab 01/28/13 0745 02/01/13 0450  WBC 9.2 8.0  NEUTROABS  --  4.7  HGB 9.3* 8.8*  HCT 27.9* 27.0*  MCV 83.0 86.0  PLT 266 251   Cardiac Enzymes: No results found for this basename: CKTOTAL, CKMB, CKMBINDEX, TROPONINI,  in the last 168 hours BNP (last 3 results) No  results found for this basename: PROBNP,  in the last 8760 hours CBG:  Recent Labs Lab 02/02/13 0700 02/02/13 1234 02/02/13 1656 02/02/13 2236 02/03/13 0708  GLUCAP 232* 195* 177* 188* 182*    Recent Results (from the past 240 hour(s))  CULTURE, ROUTINE-ABSCESS     Status: None   Collection Time    01/27/13  3:50 PM      Result Value Range Status   Specimen Description ABSCESS GROIN RIGHT   Final   Special Requests Normal   Final   Gram Stain     Final   Value: FEW WBC PRESENT,BOTH PMN AND MONONUCLEAR     NO SQUAMOUS EPITHELIAL CELLS SEEN     NO ORGANISMS SEEN   Culture     Final   Value: FEW GROUP B STREP(S.AGALACTIAE)ISOLATED     Note: TESTING AGAINST S. AGALACTIAE NOT ROUTINELY PERFORMED DUE TO PREDICTABILITY OF AMP/PEN/VAN SUSCEPTIBILITY.   Report Status 01/30/2013 FINAL   Final  URINE CULTURE     Status: None   Collection Time    01/30/13  2:37 PM      Result Value Range Status   Specimen Description URINE, CATHETERIZED   Final   Special Requests Unasyn, doxycycline   Final   Culture  Setup Time 01/30/2013 15:18   Final   Colony Count NO GROWTH   Final   Culture NO GROWTH   Final   Report Status 01/31/2013 FINAL   Final     Studies: No results found.  Scheduled Meds: . amLODipine  5 mg Oral Daily  . atorvastatin  80 mg Oral q1800  . cefTRIAXone (ROCEPHIN)  IV  1 g Intravenous Q24H  . doxycycline  100 mg Oral Q12H  . feeding supplement  237 mL Oral BID BM  . FLUoxetine  20 mg Oral Daily  . gabapentin  800 mg Oral TID  . heparin subcutaneous  5,000 Units Subcutaneous Q8H  . insulin aspart  0-20 Units Subcutaneous TID WC  . insulin aspart  0-5 Units Subcutaneous QHS  . insulin detemir  70 Units Subcutaneous BID  . levothyroxine  25 mcg Oral QAC breakfast  . metoCLOPramide  5 mg Oral TID WC  . oxybutynin  5 mg Oral Daily  . pantoprazole  40 mg Oral Daily  . triamcinolone cream   Topical BID   Continuous Infusions: . sodium chloride 75 mL/hr at 02/03/13  0552  . lactated ringers 50 mL/hr at 01/28/13 1610    Principal Problem:   Abscess of groin, right Active Problems:   Diabetes mellitus type 2, uncontrolled, with complications   DEPRESSION   PERIPHERAL NEUROPATHY   HYPERCHOLESTEROLEMIA   Hyponatremia   HTN (  hypertension)   Leukocytosis   Anemia of chronic disease   Hypothyroidism   ARF (acute renal failure)   Proteinuria    Time spent:    CHIU, STEPHEN K  [facility] Pager [phone]. If 7PM-7AM, please contact night-coverage at [email or web address], password [facility] 02/03/2013, 8:50 AM  LOS: 11 days

## 2013-02-03 NOTE — Progress Notes (Signed)
6 Days Post-Op  Subjective: Pt is alert, awake and oriented, appears to be having visual hallucinations.  Asking to take tray and fork away.  Pain is under good control.  Voiding without any difficulties.    Objective: Vital signs in last 24 hours: Temp:  [98 F (36.7 C)-98.9 F (37.2 C)] 98.8 F (37.1 C) (06/16 0605) Pulse Rate:  [56-60] 60 (06/16 0605) Resp:  [18] 18 (06/16 0605) BP: (121-141)/(63-87) 121/87 mmHg (06/16 0605) SpO2:  [94 %-95 %] 94 % (06/16 0605) Weight:  [250 lb (113.399 kg)] 250 lb (113.399 kg) (06/16 0500) Last BM Date: 01/31/13  Intake/Output from previous day: 06/15 0701 - 06/16 0700 In: 2950 [P.O.:920; I.V.:1790] Out: -  Intake/Output this shift: Total I/O In: 240 [P.O.:240] Out: -   General appearance: alert and oriented to person place and time.  Cooperative and appropriate, but having visual hallucinations. No distress and morbidly obese Resp: clear to auscultation bilaterally Cardio: regular rate and rhythm, S1, S2 normal, no murmur, click, rub or gallop GI: soft, non-tender; bowel sounds normal; no masses,  no organomegaly Extremities: extremities normal, atraumatic, no cyanosis or edema.  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 Skin: right groin wound, with serous drainage, no erythema or eschar, with good granulation tissue. Neurologic: Alert and oriented X 3, normal strength and tone. Normal symmetric reflexes.  Lab Results:   Recent Labs  02/01/13 0450  WBC 8.0  HGB 8.8*  HCT 27.0*  PLT 251   BMET  Recent Labs  02/01/13 0450 02/03/13 0750  NA 143 137  K 4.1 5.8*  CL 104 100  CO2 27 25  GLUCOSE 84 198*  BUN 18 40*  CREATININE 1.14* 2.29*  CALCIUM 9.0 8.5   Anti-infectives: Anti-infectives   Start     Dose/Rate Route Frequency Ordered Stop   01/30/13 1500  cefTRIAXone (ROCEPHIN) 1 g in dextrose 5 % 50 mL IVPB  Status:  Discontinued     1 g 100 mL/hr over 30 Minutes Intravenous Every 24 hours  01/30/13 1344 02/03/13 0959   01/29/13 2300  ertapenem (INVANZ) 1 g in sodium chloride 0.9 % 50 mL IVPB  Status:  Discontinued     1 g 100 mL/hr over 30 Minutes Intravenous Every 24 hours 01/29/13 1418 01/30/13 1329   01/26/13 1530  doxycycline (VIBRA-TABS) tablet 100 mg     100 mg Oral Every 12 hours 01/26/13 1427     01/25/13 2300  ertapenem (INVANZ) 0.5 g in sodium chloride 0.9 % 50 mL IVPB  Status:  Discontinued     500 mg 100 mL/hr over 30 Minutes Intravenous Every 24 hours 01/25/13 1906 01/29/13 1418   01/25/13 0000  ertapenem (INVANZ) 1 g in sodium chloride 0.9 % 50 mL IVPB  Status:  Discontinued     1 g 100 mL/hr over 30 Minutes Intravenous Every 24 hours 01/23/13 2307 01/25/13 1902   01/24/13 1000  vancomycin (VANCOCIN) IVPB 1000 mg/200 mL premix  Status:  Discontinued     1,000 mg 200 mL/hr over 60 Minutes Intravenous Every 12 hours 01/23/13 2309 01/25/13 1859   01/23/13 2315  vancomycin (VANCOCIN) IVPB 1000 mg/200 mL premix     1,000 mg 200 mL/hr over 60 Minutes Intravenous  Once 01/23/13 2309 01/24/13 0314   01/23/13 2300  ertapenem (INVANZ) 1 g in sodium chloride 0.9 % 50 mL IVPB     1 g 100 mL/hr over 30 Minutes Intravenous  Once 01/23/13 2252 01/24/13 0053  01/23/13 2200  vancomycin (VANCOCIN) IVPB 1000 mg/200 mL premix     1,000 mg 200 mL/hr over 60 Minutes Intravenous  Once 01/23/13 2146 01/23/13 2311      Assessment/Plan: Right groin wound. Irrigation and debridement of the right groin Jenne Campus, MD, 01/28/2013  INCISION AND DRAINAGE ABSCESS right groin abcess, 01/24/2013, Clovis Pu. Cornett, MD.   -continue with hydrotherapy, bid wet to dry dressing changes.  I changed the dressing today which looked fairly good. -Blood cultures negative -rocephin stopped.  Doxycycline completed 8 days, stop. -encourage mobility  Delirium -discussed with Dr. Rhona Leavens, probably related to polypharmacy.  She is actually much better today than the previous days.  Will  obtain labs and UA to rule out infectious etiology.    LOS: 11 days    Bonner Puna [facility] ANP-BC Pager [phone]  02/03/2013 11:36 AM

## 2013-02-03 NOTE — Progress Notes (Signed)
Physical Therapy Wound Treatment Patient Details  Name: [name] MRN: [id number] Date of Birth: 03/01/1961  Today's Date: 02/03/2013 Time: 1220-1252 Time Calculation (min): 32 min  Subjective  Subjective: I hurts.Marland Kitchen...just want it over with Patient and Family Stated Goals: Want to go home  Pain Score: Pain Score:   2  Wound Assessment  Pressure Ulcer 12/06/12 Stage I -  Intact skin with non-blanchable redness of a localized area usually over a bony prominence. (Active)     Wound 01/23/13 Other (Comment) Groin Right (Active)  Site / Wound Assessment Clean;Bleeding;Pink;Yellow 02/03/2013  1:37 PM  % Wound base Red or Granulating 90% 02/03/2013  1:37 PM  % Wound base Yellow 10% 02/03/2013  1:37 PM  Peri-wound Assessment Intact 02/03/2013  1:37 PM  Wound Length (cm) 8 cm 02/01/2013 12:11 PM  Wound Width (cm) 2 cm 02/01/2013 12:11 PM  Wound Depth (cm) 3 cm 02/01/2013 12:11 PM  Tunneling (cm) 4 02/01/2013 12:11 PM  Undermining (cm) 5.5 02/01/2013 12:11 PM  Margins Attached edges (approximated) 02/03/2013  1:37 PM  Closure None 02/03/2013  1:37 PM  Drainage Amount Minimal 02/03/2013  1:37 PM  Drainage Description Serosanguineous 02/03/2013  1:37 PM  Treatment Cleansed;Debridement (Selective);Hydrotherapy (Pulse lavage);Packing (Saline gauze) 02/01/2013 12:11 PM  Dressing Type Gauze (Comment);ABD 02/03/2013  1:37 PM  Dressing Changed Changed 02/01/2013 12:11 PM  Dressing Status Intact;Clean;Dry 02/03/2013  1:37 PM     Wound 02/02/13 Abrasion(s) Coccyx Mid stage 1  ulcer (Active)  % Wound base Red or Granulating 100% 02/03/2013  7:30 AM  Wound Length (cm) 1 cm 02/02/2013  8:00 PM  Drainage Amount None 02/03/2013  7:30 AM  Treatment Cleansed 02/03/2013  5:52 AM  Dressing Type None 02/03/2013  7:30 AM     Incision 01/24/13 Groin Right (Active)  Site / Wound Assessment Clean;Dry 01/31/2013  8:30 PM  Margins Unattacted edges (unapproximated) 01/29/2013 10:29 AM  Drainage Amount Minimal 01/31/2013 10:00  AM  Drainage Description Serosanguineous 01/31/2013 10:00 AM  Treatment Cleansed 02/03/2013  5:52 AM  Dressing Type Gauze (Comment) 02/03/2013  7:30 AM  Dressing Clean;Dry;Intact;Changed 02/03/2013  7:30 AM     Incision 01/28/13 Abdomen Right (Active)  Dressing Type ABD;Gauze (Comment) 01/30/2013  8:30 AM  Dressing Clean;Dry;Intact 01/30/2013  8:30 AM   Hydrotherapy Pulsed lavage therapy - wound location: R groin Pulsed Lavage with Suction (psi): 4 psi Pulsed Lavage with Suction - Normal Saline Used: 1000 mL Pulsed Lavage Tip: Tip with splash shield Selective Debridement Selective Debridement - Location: R groin Selective Debridement - Tools Used: Forceps;Scissors Selective Debridement - Tissue Removed: yellow slough/ necrotic tissue   Wound Assessment and Plan  Wound Therapy - Assess/Plan/Recommendations Wound Therapy - Clinical Statement: pt can benefit from the PLS to flush the wound and tunnels to cleanse and hopefully decr the bacterial load to promote granulation Wound Therapy - Functional Problem List: decr mobility Factors Delaying/Impairing Wound Healing: Diabetes Mellitus;Vascular compromise Hydrotherapy Plan: Debridement;Dressing change;Electrical stimulation;Pulsatile lavage with suction Wound Therapy - Frequency: 6X / week Wound Therapy - Current Recommendations: Surgery consult;PT Wound Therapy - Follow Up Recommendations: Home health RN Wound Plan: cleanse, selective debridement, clean packing to promote healthy granulation  Wound Therapy Goals- Improve the function of patient's integumentary system by progressing the wound(s) through the phases of wound healing (inflammation - proliferation - remodeling) by: Decrease Necrotic Tissue to: 0% Decrease Necrotic Tissue - Progress: Progressing toward goal Increase Granulation Tissue to: 100% Increase Granulation Tissue - Progress: Progressing toward goal Improve Drainage Characteristics: Serous  Improve Drainage  Characteristics - Progress: Progressing toward goal Time For Goal Achievement: 2 weeks Wound Therapy - Potential for Goals: Good  Goals will be updated until maximal potential achieved or discharge criteria met.  Discharge criteria: when goals achieved, discharge from hospital, MD decision/surgical intervention, no progress towards goals, refusal/missing three consecutive treatments without notification or medical reason.  GP     [name], [name] 02/03/2013, 1:42 PM 02/03/2013  [place] Bing, PT [phone]  (pager)

## 2013-02-04 LAB — GLUCOSE, CAPILLARY
Glucose-Capillary: 88 mg/dL (ref 70–99)
Glucose-Capillary: 118 mg/dL — ABNORMAL HIGH (ref 70–99)

## 2013-02-04 LAB — CBC WITH DIFFERENTIAL/PLATELET
Basophils Absolute: 0 10*3/uL (ref 0.0–0.1)
Basophils Relative: 0 % (ref 0–1)
Hemoglobin: 8.6 g/dL — ABNORMAL LOW (ref 12.0–15.0)
MCHC: 32.6 g/dL (ref 30.0–36.0)
Neutro Abs: 4.9 10*3/uL (ref 1.7–7.7)
Neutrophils Relative %: 68 % (ref 43–77)
RDW: 15.7 % — ABNORMAL HIGH (ref 11.5–15.5)

## 2013-02-04 LAB — COMPREHENSIVE METABOLIC PANEL
AST: 1322 U/L — ABNORMAL HIGH (ref 0–37)
Albumin: 1.9 g/dL — ABNORMAL LOW (ref 3.5–5.2)
Alkaline Phosphatase: 589 U/L — ABNORMAL HIGH (ref 39–117)
Chloride: 105 mEq/L (ref 96–112)
Potassium: 4.7 mEq/L (ref 3.5–5.1)
Sodium: 141 mEq/L (ref 135–145)
Total Bilirubin: 0.4 mg/dL (ref 0.3–1.2)

## 2013-02-04 MED ORDER — VANCOMYCIN HCL 10 G IV SOLR
2000.0000 mg | Freq: Once | INTRAVENOUS | Status: AC
  Administered 2013-02-04: 2000 mg via INTRAVENOUS
  Filled 2013-02-04: qty 2000

## 2013-02-04 MED ORDER — GABAPENTIN 100 MG PO CAPS
200.0000 mg | ORAL_CAPSULE | Freq: Three times a day (TID) | ORAL | Status: DC
  Administered 2013-02-04 – 2013-02-07 (×8): 200 mg via ORAL
  Filled 2013-02-04 (×12): qty 2

## 2013-02-04 MED ORDER — SODIUM CHLORIDE 0.9 % IV SOLN
1500.0000 mg | INTRAVENOUS | Status: AC
  Filled 2013-02-04: qty 1500

## 2013-02-04 MED ORDER — SODIUM CHLORIDE 0.9 % IV SOLN
500.0000 mg | Freq: Three times a day (TID) | INTRAVENOUS | Status: DC
  Administered 2013-02-04 – 2013-02-05 (×4): 500 mg via INTRAVENOUS
  Filled 2013-02-04 (×6): qty 500

## 2013-02-04 NOTE — Progress Notes (Signed)
SW called the facility to update them on patient's status. They are still willing to accept her when she is medically stable.  Sabino Niemann, MSW,  [phone]

## 2013-02-04 NOTE — Progress Notes (Signed)
7 Days Post-Op  Subjective: Smiling up in chair, first time I have seen that.  Groin seems better, she just had the dressing changed by hydrotherapy, unfortunately they didn't call me. She denies any pain in RUQ, but complains of some on the left.  Objective: Vital signs in last 24 hours: Temp:  [97.7 F (36.5 C)-98.7 F (37.1 C)] 97.7 F (36.5 C) (06/17 0635) Pulse Rate:  [51-65] 56 (06/17 0635) Resp:  [14-18] 16 (06/17 0635) BP: (125-146)/(55-70) 145/66 mmHg (06/17 0635) SpO2:  [90 %-92 %] 92 % (06/17 0635) Last BM Date: 01/31/13 480 PO recorded yesterday, Carb modified diet Afebrile, VSS LFT'S up ABD [place]: Abnormal gallbladder wall thickening and pericholecystic fluid. No gallstones identified. Despite the absence of a sonographic Murphy's sign, the appearance is suspicious for acute acalculus cholecystitis. Splenomegaly without focal splenic lesion. Also noted.    Intake/Output from previous day: 06/16 0701 - 06/17 0700 In: 1480 [P.O.:480; I.V.:1000] Out: 500 [Urine:500] Intake/Output this shift:    General appearance: alert, cooperative and no distress GI: soft, only complaint is really LUQ, but nothing currently sitting up in chair Extremities: I did not see her groin and hydro just did her RX and dressing change.  Her RLE stump is swollen as is her left leg, but she has what looks like purpura at the end of the stump, not allot buts some. Platelets are normal. I don't see it any place else.  Lab Results:   Recent Labs  02/03/13 1140 02/04/13 0528  WBC 12.7* 7.2  HGB 8.5* 8.6*  HCT 26.2* 26.4*  PLT 296 202    BMET  Recent Labs  02/03/13 1140 02/04/13 0528  NA 136 141  K 5.5* 4.7  CL 100 105  CO2 23 26  GLUCOSE 189* 115*  BUN 42* 37*  CREATININE 2.25* 1.63*  CALCIUM 8.5 8.6   PT/INR No results found for this basename: LABPROT, INR,  in the last 72 hours   Recent Labs Lab 01/30/13 0425 02/01/13 0450 02/04/13 0528  AST 72* 28 1322*  ALT  236* 106* 1456*  ALKPHOS 244* 222* 589*  BILITOT 0.2* 0.2* 0.4  PROT 6.3 6.3 6.4  ALBUMIN 1.8* 1.9* 1.9*     Lipase     Component Value Date/Time   LIPASE 38 12/05/2012 0317     Studies/Results: US Abdomen Complete  02/03/2013   *RADIOLOGY REPORT*  Clinical Data:  52 year old female with abnormal LFTs.  Diabetes, cellulitis.  COMPLETE ABDOMINAL ULTRASOUND  Comparison:  Pelvis CT 01/23/2013.  Renal ultrasound 01/25/2013.  Findings:  Gallbladder:  Positive wall thickening up to 12 mm.  Trace pericholecystic fluid.  No sludge or stones identified.  No sonographic Murphy's sign elicited.  Common bile duct:  Normal measuring 2-3 mm diameter.  Liver:  Echotexture within normal limits.  No intrahepatic ductal dilatation.  No discrete liver lesion identified.  IVC:  Incompletely visualized due to overlying bowel gas, visualized portions within normal limits.  Pancreas:  Incompletely visualized due to overlying bowel gas, visualized portions within normal limits.  Spleen:  Echotexture within normal limits.  Splenomegaly. Calculated splenic volume of 646 ml (Normal splenic volume range 83 - 412 mL). No discrete lesion identified.  Right Kidney:  Normal measuring 13.5 cm in length.  Left Kidney:  Normal measuring 13.6 cm in length.  Abdominal aorta:  Incompletely visualized due to overlying bowel gas, visualized portions within normal limits.  IMPRESSION: 1.  Abnormal gallbladder wall thickening and pericholecystic fluid. No gallstones identified.  Despite  the absence of a sonographic Murphy's sign, the appearance is suspicious for acute acalculus cholecystitis. 2.  Splenomegaly without focal splenic lesion.  Perhaps this is reactive.  Study discussed by telephone with Provider L. Easterwood) on 02/03/2013 at 2035 hours.   Original Report Authenticated By: Erskine Speed, M.D.    Medications: . amLODipine  5 mg Oral Daily  . feeding supplement  237 mL Oral BID BM  . FLUoxetine  20 mg Oral Daily  . gabapentin   200 mg Oral TID  . heparin subcutaneous  5,000 Units Subcutaneous Q8H  . imipenem-cilastatin  500 mg Intravenous Q8H  . insulin aspart  0-20 Units Subcutaneous TID WC  . insulin aspart  0-5 Units Subcutaneous QHS  . insulin detemir  70 Units Subcutaneous BID  . levothyroxine  25 mcg Oral QAC breakfast  . metoCLOPramide  5 mg Oral TID WC  . oxybutynin  5 mg Oral Daily  . pantoprazole  40 mg Oral Daily  . triamcinolone cream   Topical BID    Assessment/Plan Right groin wound. Irrigation and debridement of the right groin Jenne Campus, MD, 01/28/2013  INCISION AND DRAINAGE ABSCESS right groin abcess, 01/24/2013, [place]is Pu. Cornett, MD.  +MRSA on nasal swab  Wound culture: MODERATE GROUP B STREP(S.AGALACTIAE)ISOLATED  Still on antibiotics; day 6 Invanz yesterday next dose at 2300 today; and starting day 5 of Doxycycline today. No change in cultures.  No growth on blood cultures  DIABETES MELLITUS, TYPE II  - uncontrolled, with neuropathy  Severe renal insuffiencey (creatinine up to 4.74, yesterday down to 2.75)  DEPRESSION  HTN (hypertension)  Hyponatremia  Anemia of chronic disease  Foley out but incontinent.  Hypoxia (resolved) Possilbe cholecystitis/spleenomegaly    Plan:  I will order a HIDA scan for tomorrow, she needs to be without narcotics for 6 hours before the study, and I have told her that.  I will try and make sure the nurses know and have Hydro call us for dressing change tomorrow. WBC is normal.  LOS: 12 days    Jule Whitsel 02/04/2013

## 2013-02-04 NOTE — Progress Notes (Signed)
Pt re-evaluated. Petechial rash over R stump appears to be more confluent and erythematous. Slightly tender to palpation. Will transition pt from PO doxy to IV vanc empirically to cover for questionable cellulitis

## 2013-02-04 NOTE — Progress Notes (Signed)
Pt confused during the night, hallucinations, and talking to self and others not in the room.  Keeps taking oxygen off, sats down to 75% when O2 off.

## 2013-02-04 NOTE — Progress Notes (Signed)
Physical Therapy Treatment Patient Details Name: [name] MRN: [id number] DOB: 1961/01/04 Today's Date: 02/04/2013 Time: 1040-1105 PT Time Calculation (min): 25 min  PT Assessment / Plan / Recommendation Comments on Treatment Session  Worked on safe sit to stand and swing to gait pattern without Prosthesis on.  Wrapped the stump; with 2 ACE wraps to begin srinking the limb down to a size small enough to fit the prosthesis.    Follow Up Recommendations  SNF;Supervision/Assistance - 24 hour     Does the patient have the potential to tolerate intense rehabilitation     Barriers to Discharge        Equipment Recommendations  None recommended by PT    Recommendations for Other Services    Frequency Min 3X/week   Plan Discharge plan remains appropriate;Frequency remains appropriate    Precautions / Restrictions Precautions Precautions: Fall   Pertinent Vitals/Pain     Mobility  Bed Mobility Bed Mobility: Rolling Right;Right Sidelying to Sit;Sitting - Scoot to Edge of Bed;Rolling Left Rolling Right: 5: Supervision;With rail Rolling Left: 5: Supervision;With rail Right Sidelying to Sit: 5: Supervision;With rails Details for Bed Mobility Assistance: pt is slow, but needs no assistance Transfers Transfers: Sit to Stand;Stand to Sit Sit to Stand: 4: Min assist;With upper extremity assist;From bed Stand to Sit: 4: Min assist;With upper extremity assist;To bed;To chair/3-in-1 Details for Transfer Assistance: worked on safe transfer technique.  needed minimal steadying assist Ambulation/Gait Ambulation/Gait Assistance: 4: Min assist Ambulation Distance (Feet): 2 Feet (forward and back then another 8 feet) Assistive device: Rolling walker Ambulation/Gait Assistance Details: swing to gait with small steps.  pt did not swing backward well and L LE collapsed out from under her just as she reached the bed. Gait Pattern: Step-to pattern Stairs: No Wheelchair Mobility Wheelchair  Mobility: No    Exercises     PT Diagnosis:    PT Problem List:   PT Treatment Interventions:     PT Goals Acute Rehab PT Goals Time For Goal Achievement: 02/09/13 Potential to Achieve Goals: Good Pt will go Supine/Side to Sit: with modified independence PT Goal: Supine/Side to Sit - Progress: Progressing toward goal Pt will go Sit to Supine/Side: with modified independence PT Goal: Sit to Supine/Side - Progress: Progressing toward goal Pt will go Sit to Stand: with modified independence PT Goal: Sit to Stand - Progress: Progressing toward goal Pt will go Stand to Sit: with modified independence PT Goal: Stand to Sit - Progress: Progressing toward goal Pt will Ambulate: >150 feet;with supervision;with least restrictive assistive device PT Goal: Ambulate - Progress: Progressing toward goal Pt will Go Up / Down Stairs: 6-9 stairs;with supervision;with least restrictive assistive device PT Goal: Up/Down Stairs - Progress: Not met  Visit Information  Last PT Received On: 02/04/13 Assistance Needed: +2 (safety)    Subjective Data  Subjective: Backing up is hard   Cognition  Cognition Arousal/Alertness: Awake/alert Behavior During Therapy: WFL for tasks assessed/performed Overall Cognitive Status: Impaired/Different from baseline Area of Impairment: Attention;Safety/judgement;Problem solving Current Attention Level: Selective    Balance     End of Session PT - End of Session Equipment Utilized During Treatment: Other (comment) Activity Tolerance: Patient tolerated treatment well;Patient limited by fatigue Patient left: in chair;with call bell/phone within reach;with family/visitor present Nurse Communication: Mobility status   GP     Kesean Serviss, Eliseo Gum 02/04/2013, 12:29 PM 02/04/2013  [place] Bing, PT [phone]  (pager)

## 2013-02-04 NOTE — Progress Notes (Signed)
[facility] PROGRESS NOTE  LEELOO SILVERTHORNE ZOX:[id number] DOB: May 07, 1961 DOA: 01/23/2013 PCP: [name], MD  Assessment/Plan: Abscess of groin, right  -Status post I&D 6/6.  -Blood cultures from 5/31 shows no growth. GBS noted from abscess.  -had been on doxycycline and rocephin  -currently on monotherapy of doxycycline based on culture results  ARF  -improved today Cr 2.29->1.63 -Improved with IVF -Recent contrast nephropathy  -avoiding nephrotoxic agents  -foley place to follow urine output closely  -renal US essentially normal  Uncontrolled DIABETES MELLITUS, TYPE II  -HBA1C is 12.9.  -will continue levemir and SSI.  -Currently stable  DEPRESSION  Appears stable at this time, normal affect  -Continue Fluoxetine.  PERIPHERAL NEUROPATHY  Complication of diabetes, continue Neurontin per home dose  Hyponatremia  -Most likely due to hypovolemia.  -resolved  HTN (hypertension)  - improved  -Diuretic on hold secondary to ARF -On PRN hydralazine  -Ca channel blocker started  -Continue to monitor closely  Anemia of chronic disease  Hg and Hct stable and at pt's baseline.  Will monitor  Mild hyperkalemia:  -normalized  Hypoxia:  -Had been O2 dependent over the past several days  -abg, cxr, and VQ scan unremarkable  - Suspect possible hypoxia secondary to overmedication w/ narcs prior to dressing changes  - Would avoid over medication w/ pain meds  - Sats remains appropriate on minimal O2 support Acalculous cholecystitis: -elevated LFT's, alk phos, see abd [place] report -Surgery is already on board -Will start empiric imipenem -Will defer mgt to general surgery  Code Status: Full Family Communication: Pt in room (indicate person spoken with, relationship, and if by phone, the number) Disposition Plan: Pending  Consultants: Nephrology  General surgery  Procedures: Wound I/D on 6/6  R Groin Wound I/D 6/10  VQ on 01/31/13 negative for PE Abd [place] on 02/03/13  with abnormal gallbladder wall thickening and pericholecystic fluid  Antibiotics: IV Vanc 6/5>>>6/8  IV Ertapenem 6/5>>> 01/30/13  Doxycycline 6/8>>>  Rocephin 01/30/13>>>02/03/13 Imipenem 02/04/13>>>  HPI/Subjective: Complains of upper quadrant/epigastric "spasms." No other issues overnight.  Objective: Filed Vitals:   02/03/13 0605 02/03/13 1300 02/03/13 2216 02/04/13 0635  BP: 121/87 146/55 125/70 145/66  Pulse: 60 51 65 56  Temp: 98.8 F (37.1 C) 98.7 F (37.1 C) 97.9 F (36.6 C) 97.7 F (36.5 C)  TempSrc: Oral     Resp: 18 18 14 16   Height:      Weight:      SpO2: 94% 91% 90% 92%    Intake/Output Summary (Last 24 hours) at 02/04/13 0758 Last data filed at 02/04/13 0600  Gross per 24 hour  Intake   1240 ml  Output    500 ml  Net    740 ml   Filed Weights   02/01/13 0715 02/02/13 0500 02/03/13 0500  Weight: 106.686 kg (235 lb 3.2 oz) 109.77 kg (242 lb) 113.399 kg (250 lb)    Exam:   General:  Awake, in nad  Cardiovascular: regular, s1, s2  Respiratory: normal resp effort, no wheezing  Abdomen: mildly tender over epigastric region  Musculoskeletal: perfused, s/p R LE amputation   Data Reviewed: Basic Metabolic Panel:  Recent Labs Lab 01/28/13 0908  01/30/13 0425 02/01/13 0450 02/03/13 0750 02/03/13 1140 02/04/13 0528  NA 142  < > 140 143 137 136 141  K 3.8  < > 4.1 4.1 5.8* 5.5* 4.7  CL 105  < > 103 104 100 100 105  CO2 25  < >  28 27 25 23 26   GLUCOSE 90  < > 123* 84 198* 189* 115*  BUN 38*  < > 20 18 40* 42* 37*  CREATININE 1.73*  < > 1.18* 1.14* 2.29* 2.25* 1.63*  CALCIUM 8.8  < > 8.5 9.0 8.5 8.5 8.6  PHOS 4.2  --   --   --   --   --   --   < > = values in this interval not displayed. Liver Function Tests:  Recent Labs Lab 01/28/13 0908 01/30/13 0425 02/01/13 0450 02/04/13 0528  AST  --  72* 28 1322*  ALT  --  236* 106* 1456*  ALKPHOS  --  244* 222* 589*  BILITOT  --  0.2* 0.2* 0.4  PROT  --  6.3 6.3 6.4  ALBUMIN 1.9* 1.8* 1.9*  1.9*   No results found for this basename: LIPASE, AMYLASE,  in the last 168 hours No results found for this basename: AMMONIA,  in the last 168 hours CBC:  Recent Labs Lab 02/01/13 0450 02/03/13 1140 02/04/13 0528  WBC 8.0 12.7* 7.2  NEUTROABS 4.7  --  4.9  HGB 8.8* 8.5* 8.6*  HCT 27.0* 26.2* 26.4*  MCV 86.0 85.6 86.3  PLT 251 296 202   Cardiac Enzymes: No results found for this basename: CKTOTAL, CKMB, CKMBINDEX, TROPONINI,  in the last 168 hours BNP (last 3 results) No results found for this basename: PROBNP,  in the last 8760 hours CBG:  Recent Labs Lab 02/03/13 0708 02/03/13 1103 02/03/13 1555 02/03/13 2219 02/04/13 0639  GLUCAP 182* 177* 169* 152* 100*    Recent Results (from the past 240 hour(s))  CULTURE, ROUTINE-ABSCESS     Status: None   Collection Time    01/27/13  3:50 PM      Result Value Range Status   Specimen Description ABSCESS GROIN RIGHT   Final   Special Requests Normal   Final   Gram Stain     Final   Value: FEW WBC PRESENT,BOTH PMN AND MONONUCLEAR     NO SQUAMOUS EPITHELIAL CELLS SEEN     NO ORGANISMS SEEN   Culture     Final   Value: FEW GROUP B STREP(S.AGALACTIAE)ISOLATED     Note: TESTING AGAINST S. AGALACTIAE NOT ROUTINELY PERFORMED DUE TO PREDICTABILITY OF AMP/PEN/VAN SUSCEPTIBILITY.   Report Status 01/30/2013 FINAL   Final  URINE CULTURE     Status: None   Collection Time    01/30/13  2:37 PM      Result Value Range Status   Specimen Description URINE, CATHETERIZED   Final   Special Requests Unasyn, doxycycline   Final   Culture  Setup Time 01/30/2013 15:18   Final   Colony Count NO GROWTH   Final   Culture NO GROWTH   Final   Report Status 01/31/2013 FINAL   Final     Studies: US Abdomen Complete  02/03/2013   *RADIOLOGY REPORT*  Clinical Data:  52 year old female with abnormal LFTs.  Diabetes, cellulitis.  COMPLETE ABDOMINAL ULTRASOUND  Comparison:  Pelvis CT 01/23/2013.  Renal ultrasound 01/25/2013.  Findings:  Gallbladder:   Positive wall thickening up to 12 mm.  Trace pericholecystic fluid.  No sludge or stones identified.  No sonographic Murphy's sign elicited.  Common bile duct:  Normal measuring 2-3 mm diameter.  Liver:  Echotexture within normal limits.  No intrahepatic ductal dilatation.  No discrete liver lesion identified.  IVC:  Incompletely visualized due to overlying bowel gas, visualized portions within  normal limits.  Pancreas:  Incompletely visualized due to overlying bowel gas, visualized portions within normal limits.  Spleen:  Echotexture within normal limits.  Splenomegaly. Calculated splenic volume of 646 ml (Normal splenic volume range 83 - 412 mL). No discrete lesion identified.  Right Kidney:  Normal measuring 13.5 cm in length.  Left Kidney:  Normal measuring 13.6 cm in length.  Abdominal aorta:  Incompletely visualized due to overlying bowel gas, visualized portions within normal limits.  IMPRESSION: 1.  Abnormal gallbladder wall thickening and pericholecystic fluid. No gallstones identified.  Despite the absence of a sonographic Murphy's sign, the appearance is suspicious for acute acalculus cholecystitis. 2.  Splenomegaly without focal splenic lesion.  Perhaps this is reactive.  Study discussed by telephone with Provider L. Easterwood) on 02/03/2013 at 2035 hours.   Original Report Authenticated By: Erskine Speed, M.D.    Scheduled Meds: . amLODipine  5 mg Oral Daily  . atorvastatin  80 mg Oral q1800  . feeding supplement  237 mL Oral BID BM  . FLUoxetine  20 mg Oral Daily  . gabapentin  800 mg Oral TID  . heparin subcutaneous  5,000 Units Subcutaneous Q8H  . insulin aspart  0-20 Units Subcutaneous TID WC  . insulin aspart  0-5 Units Subcutaneous QHS  . insulin detemir  70 Units Subcutaneous BID  . levothyroxine  25 mcg Oral QAC breakfast  . metoCLOPramide  5 mg Oral TID WC  . oxybutynin  5 mg Oral Daily  . pantoprazole  40 mg Oral Daily  . triamcinolone cream   Topical BID   Continuous  Infusions: . sodium chloride 100 mL/hr at 02/03/13 1546  . lactated ringers 50 mL/hr at 01/28/13 4010    Principal Problem:   Abscess of groin, right Active Problems:   Diabetes mellitus type 2, uncontrolled, with complications   DEPRESSION   PERIPHERAL NEUROPATHY   HYPERCHOLESTEROLEMIA   Hyponatremia   HTN (hypertension)   Leukocytosis   Anemia of chronic disease   Hypothyroidism   ARF (acute renal failure)   Proteinuria    Time spent:    Devra Stare K  [facility] Pager [phone]. If 7PM-7AM, please contact night-coverage at [email or web address], password [facility] 02/04/2013, 7:58 AM  LOS: 12 days

## 2013-02-04 NOTE — Progress Notes (Signed)
Physical Therapy Wound Treatment Patient Details  Name: [name] MRN: [id number] Date of Birth: [birth year]  Today's Date: 02/04/2013 Time: 1010-1040 Time Calculation (min): 30 min  Subjective  Subjective: Here's the pain man Patient and Family Stated Goals: Want to go home  Pain Score: Pain Score:   4  Wound Assessment  Pressure Ulcer 12/06/12 Stage I -  Intact skin with non-blanchable redness of a localized area usually over a bony prominence. (Active)     Wound 01/23/13 Other (Comment) Groin Right (Active)  Site / Wound Assessment Clean;Pink;Yellow 02/04/2013 12:30 PM  % Wound base Red or Granulating 90% 02/04/2013 12:30 PM  % Wound base Yellow 10% 02/04/2013 12:30 PM  Peri-wound Assessment Intact 02/04/2013 12:30 PM  Wound Length (cm) 8 cm 02/01/2013 12:11 PM  Wound Width (cm) 2 cm 02/01/2013 12:11 PM  Wound Depth (cm) 3 cm 02/01/2013 12:11 PM  Tunneling (cm) 4 02/01/2013 12:11 PM  Undermining (cm) 5.5 02/01/2013 12:11 PM  Margins Attached edges (approximated) 02/04/2013 12:30 PM  Closure None 02/04/2013 12:30 PM  Drainage Amount Minimal 02/04/2013 12:30 PM  Drainage Description Serosanguineous 02/04/2013 12:30 PM  Treatment Cleansed;Debridement (Selective);Hydrotherapy (Pulse lavage);Packing (Saline gauze) 02/04/2013 12:30 PM  Dressing Type Gauze (Comment);ABD 02/04/2013 12:30 PM  Dressing Changed Changed 02/04/2013 12:30 PM  Dressing Status Intact;Clean;Dry 02/04/2013 12:30 PM     Wound 02/02/13 Abrasion(s) Coccyx Mid stage 1  ulcer (Active)  % Wound base Red or Granulating 100% 02/04/2013  8:00 AM  Wound Length (cm) 1 cm 02/02/2013  8:00 PM  Drainage Amount None 02/04/2013  8:00 AM  Treatment Cleansed 02/03/2013  5:52 AM  Dressing Type None 02/04/2013  8:00 AM     Incision 01/24/13 Groin Right (Active)  Site / Wound Assessment Clean;Dry 01/31/2013  8:30 PM  Margins Unattacted edges (unapproximated) 01/29/2013 10:29 AM  Drainage Amount Minimal 01/31/2013 10:00 AM  Drainage  Description Serosanguineous 01/31/2013 10:00 AM  Treatment Cleansed 02/03/2013  5:52 AM  Dressing Type Gauze (Comment) 02/04/2013  8:00 AM  Dressing Clean;Dry;Intact;Changed 02/04/2013  8:00 AM     Incision 01/28/13 Abdomen Right (Active)  Dressing Type ABD;Gauze (Comment) 01/30/2013  8:30 AM  Dressing Clean;Dry;Intact 01/30/2013  8:30 AM   Hydrotherapy Pulsed lavage therapy - wound location: R groin Pulsed Lavage with Suction (psi): 4 psi Pulsed Lavage with Suction - Normal Saline Used: 1000 mL Pulsed Lavage Tip: Tip with splash shield Selective Debridement Selective Debridement - Location: R groin Selective Debridement - Tools Used: Forceps;Scissors Selective Debridement - Tissue Removed: yellow slough/ necrotic tissue (difficult to get more than a small amount each session )   Wound Assessment and Plan  Wound Therapy - Assess/Plan/Recommendations Wound Therapy - Clinical Statement: pt can benefit from the PLS to flush the wound and tunnels to cleanse and hopefully decr the bacterial load to promote granulation Wound Therapy - Functional Problem List: decr mobility Factors Delaying/Impairing Wound Healing: Diabetes Mellitus;Vascular compromise Hydrotherapy Plan: Debridement;Dressing change;Electrical stimulation;Pulsatile lavage with suction Wound Therapy - Frequency: 6X / week Wound Therapy - Current Recommendations: Surgery consult;PT Wound Therapy - Follow Up Recommendations: Home health RN Wound Plan: cleanse, selective debridement, clean packing to promote healthy granulation  Wound Therapy Goals- Improve the function of patient's integumentary system by progressing the wound(s) through the phases of wound healing (inflammation - proliferation - remodeling) by: Decrease Necrotic Tissue to: 0% Decrease Necrotic Tissue - Progress: Progressing toward goal Increase Granulation Tissue to: 100% Increase Granulation Tissue - Progress: Progressing toward goal Improve Drainage  Characteristics: Serous  Improve Drainage Characteristics - Progress: Progressing toward goal Time For Goal Achievement: 2 weeks Wound Therapy - Potential for Goals: Good  Goals will be updated until maximal potential achieved or discharge criteria met.  Discharge criteria: when goals achieved, discharge from hospital, MD decision/surgical intervention, no progress towards goals, refusal/missing three consecutive treatments without notification or medical reason.  GP     Shanika Levings, Eliseo Gum 02/04/2013, 12:34 PM 02/04/2013  [place] Bing, PT [phone]  (pager)

## 2013-02-04 NOTE — Progress Notes (Signed)
Discussed with PA  Alma Muegge M. Rusty Villella, MD, FACS General, Bariatric, & Minimally Invasive Surgery [facility]  

## 2013-02-04 NOTE — Progress Notes (Addendum)
ANTIBIOTIC CONSULT NOTE - FOLLOW UP  Pharmacy Consult for Primaxin, Vancomycin Indication: acalculus cholecystitis, cellulitis  Allergies  Allergen Reactions  . Gadolinium      Code: VOM, Desc: Pt began vomiting immed post infusion of multihance, Onset Date: [id number]   . Ivp Dye (Iodinated Diagnostic Agents) Nausea And Vomiting  . Metformin Other (See Comments)     diarrhea  . Penicillins Hives    Patient Measurements: Height: 5\' 5"  (165.1 cm) Weight: 250 lb (113.399 kg) IBW/kg (Calculated) : 57  Vital Signs: Temp: 97.7 F (36.5 C) (06/17 0635) BP: 145/66 mmHg (06/17 0635) Pulse Rate: 56 (06/17 0635) Intake/Output from previous day: 06/16 0701 - 06/17 0700 In: 1480 [P.O.:480; I.V.:1000] Out: 500 [Urine:500]  Labs:  Recent Labs  02/03/13 0750 02/03/13 1140 02/04/13 0528  WBC  --  12.7* 7.2  HGB  --  8.5* 8.6*  PLT  --  296 202  CREATININE 2.29* 2.25* 1.63*   Estimated Creatinine Clearance: 51.3 ml/min (by C-G formula based on Cr of 1.63).  Microbiology: Recent Results (from the past 720 hour(s))  CULTURE, ROUTINE-ABSCESS     Status: None   Collection Time    01/18/13  5:48 AM      Result Value Range Status   Specimen Description ABSCESS   Final   Special Requests GROIN RIGHT   Final   Gram Stain     Final   Value: NO WBC SEEN     NO SQUAMOUS EPITHELIAL CELLS SEEN     MODERATE GRAM POSITIVE COCCI     IN PAIRS IN CHAINS   Culture     Final   Value: MODERATE GROUP B STREP(S.AGALACTIAE)ISOLATED     Note: TESTING AGAINST S. AGALACTIAE NOT ROUTINELY PERFORMED DUE TO PREDICTABILITY OF AMP/PEN/VAN SUSCEPTIBILITY.   Report Status 01/21/2013 FINAL   Final  CULTURE, BLOOD (ROUTINE X 2)     Status: None   Collection Time    01/18/13  6:45 AM      Result Value Range Status   Specimen Description BLOOD LEFT ARM   Final   Special Requests BOTTLES DRAWN AEROBIC AND ANAEROBIC 10CC   Final   Culture  Setup Time 01/18/2013 11:19   Final   Culture NO GROWTH 5 DAYS    Final   Report Status 01/24/2013 FINAL   Final  CULTURE, BLOOD (ROUTINE X 2)     Status: None   Collection Time    01/18/13  6:52 AM      Result Value Range Status   Specimen Description BLOOD LEFT HAND   Final   Special Requests BOTTLES DRAWN AEROBIC AND ANAEROBIC 10CC   Final   Culture  Setup Time 01/18/2013 11:19   Final   Culture NO GROWTH 5 DAYS   Final   Report Status 01/24/2013 FINAL   Final  SURGICAL PCR SCREEN     Status: Abnormal   Collection Time    01/24/13  5:54 AM      Result Value Range Status   MRSA, PCR NEGATIVE  NEGATIVE Final   Staphylococcus aureus POSITIVE (*) NEGATIVE Final   Comment:            The Xpert SA Assay (FDA     approved for NASAL specimens     in patients over 73 years of age),     is one component of     a comprehensive surveillance     program.  Test performance has     been validated by  [facility] for patients greater     than or equal to 33 year old.     It is not intended     to diagnose infection nor to     guide or monitor treatment.  CULTURE, ROUTINE-ABSCESS     Status: None   Collection Time    01/27/13  3:50 PM      Result Value Range Status   Specimen Description ABSCESS GROIN RIGHT   Final   Special Requests Normal   Final   Gram Stain     Final   Value: FEW WBC PRESENT,BOTH PMN AND MONONUCLEAR     NO SQUAMOUS EPITHELIAL CELLS SEEN     NO ORGANISMS SEEN   Culture     Final   Value: FEW GROUP B STREP(S.AGALACTIAE)ISOLATED     Note: TESTING AGAINST S. AGALACTIAE NOT ROUTINELY PERFORMED DUE TO PREDICTABILITY OF AMP/PEN/VAN SUSCEPTIBILITY.   Report Status 01/30/2013 FINAL   Final  URINE CULTURE     Status: None   Collection Time    01/30/13  2:37 PM      Result Value Range Status   Specimen Description URINE, CATHETERIZED   Final   Special Requests Unasyn, doxycycline   Final   Culture  Setup Time 01/30/2013 15:18   Final   Colony Count NO GROWTH   Final   Culture NO GROWTH   Final   Report Status 01/31/2013 FINAL    Final   Assessment:  s/p I&D of groin abscess 6/6 & 6/10.  Group B Strep from culture.    Vancomycin 6/5>>6/7 Ertapenem 6/5>>6/12 Ceftriaxone 6/12>>6/16 Doxycycline PO 6/8>>6/16   Changed to Primaxin this morning for gallbladder coverage. For HIDA scan 6/18. Scr up and down, recent contrast nephropathy. Afebrile, WBC 7.2.  Groin wound to be inspected by surgery team 6/18.   LFTs trended up, Lipitor discontinued.     Goal of Therapy:    Appropriate Primaxin dose for renal function and infection  Plan:   Primaxin 500 mg IV q8hrs begun this morning.  Will follow up renal function for any need to adjust.  Will follow up studies and clincial status.  Dennie Fetters, [place] Pager: [phone] 02/04/2013,2:09 PM  Added vancomycin this afternoon for cellulitis of right stump.  Plan: Vancomycin 2000 mg iv x 1 dose now, then 1500 mg iv Q 24 hours Continue to follow  Thank you. Okey Regal, PharmD 02/04/13 16:30 pm

## 2013-02-05 LAB — CBC WITH DIFFERENTIAL/PLATELET
Basophils Relative: 0 % (ref 0–1)
Eosinophils Absolute: 0.1 10*3/uL (ref 0.0–0.7)
Eosinophils Relative: 1 % (ref 0–5)
HCT: 27.7 % — ABNORMAL LOW (ref 36.0–46.0)
Hemoglobin: 8.8 g/dL — ABNORMAL LOW (ref 12.0–15.0)
Lymphs Abs: 1 10*3/uL (ref 0.7–4.0)
MCH: 27.7 pg (ref 26.0–34.0)
MCHC: 31.8 g/dL (ref 30.0–36.0)
MCV: 87.1 fL (ref 78.0–100.0)
Monocytes Absolute: 0.6 10*3/uL (ref 0.1–1.0)
Monocytes Relative: 6 % (ref 3–12)

## 2013-02-05 LAB — GLUCOSE, CAPILLARY: Glucose-Capillary: 105 mg/dL — ABNORMAL HIGH (ref 70–99)

## 2013-02-05 LAB — COMPREHENSIVE METABOLIC PANEL
Albumin: 1.9 g/dL — ABNORMAL LOW (ref 3.5–5.2)
BUN: 25 mg/dL — ABNORMAL HIGH (ref 6–23)
Creatinine, Ser: 1.07 mg/dL (ref 0.50–1.10)
GFR calc Af Amer: 68 mL/min — ABNORMAL LOW (ref >90)
Glucose, Bld: 106 mg/dL — ABNORMAL HIGH (ref 70–99)
Total Protein: 6.7 g/dL (ref 6.0–8.3)

## 2013-02-05 MED ORDER — VANCOMYCIN HCL IN DEXTROSE 1-5 GM/200ML-% IV SOLN
1000.0000 mg | Freq: Two times a day (BID) | INTRAVENOUS | Status: DC
  Administered 2013-02-06 – 2013-02-07 (×3): 1000 mg via INTRAVENOUS
  Filled 2013-02-05 (×4): qty 200

## 2013-02-05 MED ORDER — SODIUM CHLORIDE 0.9 % IV SOLN
500.0000 mg | Freq: Four times a day (QID) | INTRAVENOUS | Status: DC
  Administered 2013-02-05 – 2013-02-07 (×7): 500 mg via INTRAVENOUS
  Filled 2013-02-05 (×10): qty 500

## 2013-02-05 MED ORDER — INSULIN DETEMIR 100 UNIT/ML ~~LOC~~ SOLN
50.0000 [IU] | Freq: Two times a day (BID) | SUBCUTANEOUS | Status: DC
  Administered 2013-02-05: 50 [IU] via SUBCUTANEOUS
  Filled 2013-02-05 (×3): qty 0.5

## 2013-02-05 NOTE — Progress Notes (Signed)
8 Days Post-Op  Subjective: Sedated and only speaks when you touch her at dressing change. The nurse and record says she just had one percoct Objective: Vital signs in last 24 hours: Temp:  [97.7 F (36.5 C)-99 F (37.2 C)] 99 F (37.2 C) (06/18 0539) Pulse Rate:  [64-76] 69 (06/18 0539) Resp:  [16-17] 16 (06/18 0539) BP: (139-163)/(48-91) 163/91 mmHg (06/18 0539) SpO2:  [90 %-95 %] 90 % (06/18 0539) Weight:  [112.038 kg (247 lb)] 112.038 kg (247 lb) (06/18 0500) Last BM Date: 02/05/13  Intake/Output from previous day: 06/17 0701 - 06/18 0700 In: 1680 [P.O.:680; I.V.:1000] Out: 425 [Urine:425] Intake/Output this shift:    General appearance: slowed mentation and sedated, and complaining of pain with any touch this AM Skin: Skin color, texture, turgor normal. No rashes or lesions or Open wound looks good, she still has some exudate but it's clean.  Lab Results:   Recent Labs  02/04/13 0528 02/05/13 0440  WBC 7.2 9.6  HGB 8.6* 8.8*  HCT 26.4* 27.7*  PLT 202 210    BMET  Recent Labs  02/04/13 0528 02/05/13 0440  NA 141 139  K 4.7 5.2*  CL 105 104  CO2 26 25  GLUCOSE 115* 106*  BUN 37* 25*  CREATININE 1.63* 1.07  CALCIUM 8.6 8.4   PT/INR No results found for this basename: LABPROT, INR,  in the last 72 hours   Recent Labs Lab 01/30/13 0425 02/01/13 0450 02/04/13 0528 02/05/13 0440  AST 72* 28 1322* 498*  ALT 236* 106* 1456* 975*  ALKPHOS 244* 222* 589* 505*  BILITOT 0.2* 0.2* 0.4 0.4  PROT 6.3 6.3 6.4 6.7  ALBUMIN 1.8* 1.9* 1.9* 1.9*     Lipase     Component Value Date/Time   LIPASE 38 12/05/2012 0317     Studies/Results: US Abdomen Complete  02/03/2013   *RADIOLOGY REPORT*  Clinical Data:  52 year old female with abnormal LFTs.  Diabetes, cellulitis.  COMPLETE ABDOMINAL ULTRASOUND  Comparison:  Pelvis CT 01/23/2013.  Renal ultrasound 01/25/2013.  Findings:  Gallbladder:  Positive wall thickening up to 12 mm.  Trace pericholecystic fluid.  No  sludge or stones identified.  No sonographic Murphy's sign elicited.  Common bile duct:  Normal measuring 2-3 mm diameter.  Liver:  Echotexture within normal limits.  No intrahepatic ductal dilatation.  No discrete liver lesion identified.  IVC:  Incompletely visualized due to overlying bowel gas, visualized portions within normal limits.  Pancreas:  Incompletely visualized due to overlying bowel gas, visualized portions within normal limits.  Spleen:  Echotexture within normal limits.  Splenomegaly. Calculated splenic volume of 646 ml (Normal splenic volume range 83 - 412 mL). No discrete lesion identified.  Right Kidney:  Normal measuring 13.5 cm in length.  Left Kidney:  Normal measuring 13.6 cm in length.  Abdominal aorta:  Incompletely visualized due to overlying bowel gas, visualized portions within normal limits.  IMPRESSION: 1.  Abnormal gallbladder wall thickening and pericholecystic fluid. No gallstones identified.  Despite the absence of a sonographic Murphy's sign, the appearance is suspicious for acute acalculus cholecystitis. 2.  Splenomegaly without focal splenic lesion.  Perhaps this is reactive.  Study discussed by telephone with Provider L. Easterwood) on 02/03/2013 at 2035 hours.   Original Report Authenticated By: Erskine Speed, M.D.    Medications: . amLODipine  5 mg Oral Daily  . feeding supplement  237 mL Oral BID BM  . FLUoxetine  20 mg Oral Daily  . gabapentin  200  mg Oral TID  . heparin subcutaneous  5,000 Units Subcutaneous Q8H  . imipenem-cilastatin  500 mg Intravenous Q8H  . insulin aspart  0-20 Units Subcutaneous TID WC  . insulin aspart  0-5 Units Subcutaneous QHS  . insulin detemir  70 Units Subcutaneous BID  . levothyroxine  25 mcg Oral QAC breakfast  . metoCLOPramide  5 mg Oral TID WC  . oxybutynin  5 mg Oral Daily  . pantoprazole  40 mg Oral Daily  . triamcinolone cream   Topical BID  . vancomycin  1,500 mg Intravenous Q24H    Assessment/Plan Right groin wound.  Irrigation and debridement of the right groin Jenne Campus, MD, 01/28/2013  INCISION AND DRAINAGE ABSCESS right groin abcess, 01/24/2013, Cl[place]is Pu. Cornett, MD.  +MRSA on nasal swab  Wound culture: MODERATE GROUP B STREP(S.AGALACTIAE)ISOLATED  Still on antibiotics; day 6 Invanz yesterday next dose at 2300 today; and starting day 5 of Doxycycline today. No change in cultures.  No growth on blood cultures  DIABETES MELLITUS, TYPE II  - uncontrolled, with neuropathy  Severe renal insuffiencey (creatinine up to 4.74, yesterday down to 2.75)  DEPRESSION  HTN (hypertension)  Hyponatremia  Anemia of chronic disease  Foley out but incontinent.  Hypoxia (resolved)  Possilbe cholecystitis/spleenomegaly   Plan:  I ask the nurse to call and see if the percocet will interfere with HIDA.  I will get Dr Andrey Campanile to look at it and see if he thinks we might try a wound vac it this Area.       LOS: 13 days    Torence Palmeri 02/05/2013

## 2013-02-05 NOTE — Progress Notes (Signed)
[facility] PROGRESS NOTE  ARIDAY BRINKER WUJ:[id number] DOB: [birth year] DOA: 01/23/2013 PCP: [name], MD  Assessment/Plan: Right Groin Abscess -Cx with GBS. -Had been on doxy, but has been upgraded to vanc/invanz 2/2 cellulitis and cholecystitis. -Wound care as per surgery.  Transaminitis -Presumed 2/2 acalculous cholecystitis as evidenced on [place]. -HIDA scan pending. -CCS consulted (may need GB out this admission). -Trending down. Pincus Sanes added 6/17 to abx regimen.  Right Stump Cellulitis -Agree with broadening of antibiotics for now.  Petechial Rash -Over right stump and LLE. -Cause remains unclear. -My recommendation will be to observe, and consider an OP punch biopsy by dermatology if no significant improvement in 2-3 weeks. -Platelet count ok at 210.  ARF -Improving.  DM II -Has been hypoglymic. -This could explain her somnolence at times. -Will decrease lantus to 50 units from 70 units.  HTN -Fair control. -No plans to change medications.  Code Status: Full code Family Communication: Patient and mother at bedside  Disposition Plan: SNF once medically ready.   Consultants:  CCS, Dr. Andrey Campanile   Antibiotics:  Eli Hose   Subjective: Sleepy, but wakes up to voice and is appropriate.  Objective: Filed Vitals:   02/04/13 2119 02/05/13 0500 02/05/13 0539 02/05/13 1400  BP: 163/48  163/91 146/62  Pulse: 64  69 63  Temp: 98.3 F (36.8 C)  99 F (37.2 C) 98.2 F (36.8 C)  TempSrc:      Resp: 16  16 17   Height:      Weight:  112.038 kg (247 lb)    SpO2: 95%  90% 93%    Intake/Output Summary (Last 24 hours) at 02/05/13 1532 Last data filed at 02/05/13 0700  Gross per 24 hour  Intake   1200 ml  Output    425 ml  Net    775 ml   Filed Weights   02/02/13 0500 02/03/13 0500 02/05/13 0500  Weight: 109.77 kg (242 lb) 113.399 kg (250 lb) 112.038 kg (247 lb)    Exam:   General:  AA Ox3  Cardiovascular: RRR  Respiratory: CTA  B  Abdomen: obese, S/NT/ND/+BS  Extremities: Bilateral petechial lesions , no C/C?E   Neurologic:  Grossly intact and non-focal.  Data Reviewed: Basic Metabolic Panel:  Recent Labs Lab 02/01/13 0450 02/03/13 0750 02/03/13 1140 02/04/13 0528 02/05/13 0440  NA 143 137 136 141 139  K 4.1 5.8* 5.5* 4.7 5.2*  CL 104 100 100 105 104  CO2 27 25 23 26 25   GLUCOSE 84 198* 189* 115* 106*  BUN 18 40* 42* 37* 25*  CREATININE 1.14* 2.29* 2.25* 1.63* 1.07  CALCIUM 9.0 8.5 8.5 8.6 8.4   Liver Function Tests:  Recent Labs Lab 01/30/13 0425 02/01/13 0450 02/04/13 0528 02/05/13 0440  AST 72* 28 1322* 498*  ALT 236* 106* 1456* 975*  ALKPHOS 244* 222* 589* 505*  BILITOT 0.2* 0.2* 0.4 0.4  PROT 6.3 6.3 6.4 6.7  ALBUMIN 1.8* 1.9* 1.9* 1.9*   No results found for this basename: LIPASE, AMYLASE,  in the last 168 hours No results found for this basename: AMMONIA,  in the last 168 hours CBC:  Recent Labs Lab 02/01/13 0450 02/03/13 1140 02/04/13 0528 02/05/13 0440  WBC 8.0 12.7* 7.2 9.6  NEUTROABS 4.7  --  4.9 8.0*  HGB 8.8* 8.5* 8.6* 8.8*  HCT 27.0* 26.2* 26.4* 27.7*  MCV 86.0 85.6 86.3 87.1  PLT 251 296 202 210   Cardiac Enzymes: No results found for this basename:  CKTOTAL, CKMB, CKMBINDEX, TROPONINI,  in the last 168 hours BNP (last 3 results) No results found for this basename: PROBNP,  in the last 8760 hours CBG:  Recent Labs Lab 02/04/13 1613 02/04/13 2152 02/05/13 0400 02/05/13 0833 02/05/13 1242  GLUCAP 88 118* 105* 66* 62*    Recent Results (from the past 240 hour(s))  CULTURE, ROUTINE-ABSCESS     Status: None   Collection Time    01/27/13  3:50 PM      Result Value Range Status   Specimen Description ABSCESS GROIN RIGHT   Final   Special Requests Normal   Final   Gram Stain     Final   Value: FEW WBC PRESENT,BOTH PMN AND MONONUCLEAR     NO SQUAMOUS EPITHELIAL CELLS SEEN     NO ORGANISMS SEEN   Culture     Final   Value: FEW GROUP B  STREP(S.AGALACTIAE)ISOLATED     Note: TESTING AGAINST S. AGALACTIAE NOT ROUTINELY PERFORMED DUE TO PREDICTABILITY OF AMP/PEN/VAN SUSCEPTIBILITY.   Report Status 01/30/2013 FINAL   Final  URINE CULTURE     Status: None   Collection Time    01/30/13  2:37 PM      Result Value Range Status   Specimen Description URINE, CATHETERIZED   Final   Special Requests Unasyn, doxycycline   Final   Culture  Setup Time 01/30/2013 15:18   Final   Colony Count NO GROWTH   Final   Culture NO GROWTH   Final   Report Status 01/31/2013 FINAL   Final  CULTURE, BLOOD (ROUTINE X 2)     Status: None   Collection Time    02/04/13  5:15 PM      Result Value Range Status   Specimen Description BLOOD RIGHT ARM   Final   Special Requests BOTTLES DRAWN AEROBIC ONLY 10CC   Final   Culture  Setup Time 02/04/2013 21:34   Final   Culture     Final   Value:        BLOOD CULTURE RECEIVED NO GROWTH TO DATE CULTURE WILL BE HELD FOR 5 DAYS BEFORE ISSUING A FINAL NEGATIVE REPORT   Report Status PENDING   Incomplete  CULTURE, BLOOD (ROUTINE X 2)     Status: None   Collection Time    02/04/13  5:25 PM      Result Value Range Status   Specimen Description BLOOD RIGHT HAND   Final   Special Requests BOTTLES DRAWN AEROBIC AND ANAEROBIC 5CC   Final   Culture  Setup Time 02/04/2013 21:36   Final   Culture     Final   Value:        BLOOD CULTURE RECEIVED NO GROWTH TO DATE CULTURE WILL BE HELD FOR 5 DAYS BEFORE ISSUING A FINAL NEGATIVE REPORT   Report Status PENDING   Incomplete     Studies: US Abdomen Complete  02/03/2013   *RADIOLOGY REPORT*  Clinical Data:  52 year old female with abnormal LFTs.  Diabetes, cellulitis.  COMPLETE ABDOMINAL ULTRASOUND  Comparison:  Pelvis CT 01/23/2013.  Renal ultrasound 01/25/2013.  Findings:  Gallbladder:  Positive wall thickening up to 12 mm.  Trace pericholecystic fluid.  No sludge or stones identified.  No sonographic Murphy's sign elicited.  Common bile duct:  Normal measuring 2-3 mm  diameter.  Liver:  Echotexture within normal limits.  No intrahepatic ductal dilatation.  No discrete liver lesion identified.  IVC:  Incompletely visualized due to overlying bowel gas, visualized portions within  normal limits.  Pancreas:  Incompletely visualized due to overlying bowel gas, visualized portions within normal limits.  Spleen:  Echotexture within normal limits.  Splenomegaly. Calculated splenic volume of 646 ml (Normal splenic volume range 83 - 412 mL). No discrete lesion identified.  Right Kidney:  Normal measuring 13.5 cm in length.  Left Kidney:  Normal measuring 13.6 cm in length.  Abdominal aorta:  Incompletely visualized due to overlying bowel gas, visualized portions within normal limits.  IMPRESSION: 1.  Abnormal gallbladder wall thickening and pericholecystic fluid. No gallstones identified.  Despite the absence of a sonographic Murphy's sign, the appearance is suspicious for acute acalculus cholecystitis. 2.  Splenomegaly without focal splenic lesion.  Perhaps this is reactive.  Study discussed by telephone with Provider L. Easterwood) on 02/03/2013 at 2035 hours.   Original Report Authenticated By: Erskine Speed, M.D.    Scheduled Meds: . amLODipine  5 mg Oral Daily  . feeding supplement  237 mL Oral BID BM  . FLUoxetine  20 mg Oral Daily  . gabapentin  200 mg Oral TID  . heparin subcutaneous  5,000 Units Subcutaneous Q8H  . imipenem-cilastatin  500 mg Intravenous Q6H  . insulin aspart  0-20 Units Subcutaneous TID WC  . insulin aspart  0-5 Units Subcutaneous QHS  . insulin detemir  70 Units Subcutaneous BID  . levothyroxine  25 mcg Oral QAC breakfast  . metoCLOPramide  5 mg Oral TID WC  . oxybutynin  5 mg Oral Daily  . pantoprazole  40 mg Oral Daily  . triamcinolone cream   Topical BID  . vancomycin  1,500 mg Intravenous Q24H  . [START ON 02/06/2013] vancomycin  1,000 mg Intravenous Q12H   Continuous Infusions: . sodium chloride 100 mL/hr at 02/05/13 0700  . lactated  ringers 50 mL/hr at 01/28/13 2440    Principal Problem:   Abscess of groin, right Active Problems:   Diabetes mellitus type 2, uncontrolled, with complications   DEPRESSION   PERIPHERAL NEUROPATHY   HYPERCHOLESTEROLEMIA   Hyponatremia   HTN (hypertension)   Leukocytosis   Anemia of chronic disease   Hypothyroidism   ARF (acute renal failure)   Proteinuria    Time spent: 35 minutes    HERNANDEZ ACOSTA,Lateka Rady  [facility] Pager [phone]  If 7PM-7AM, please contact night-coverage at [email or web address], password [facility] 02/05/2013, 3:32 PM  LOS: 13 days

## 2013-02-05 NOTE — Progress Notes (Signed)
Cr up today. Will consider increasing IVF For HIDA tomorrow No abdominal pain

## 2013-02-05 NOTE — Progress Notes (Signed)
ANTIBIOTIC CONSULT NOTE - FOLLOW UP  Pharmacy Consult for Vancomycin and Primaxin Indication: acalculus cholecystitis and right stump cellulitis  Allergies  Allergen Reactions  . Gadolinium      Code: VOM, Desc: Pt began vomiting immed post infusion of multihance, Onset Date: [id number]   . Ivp Dye (Iodinated Diagnostic Agents) Nausea And Vomiting  . Metformin Other (See Comments)     diarrhea  . Penicillins Hives    Patient Measurements: Height: 5\' 5"  (165.1 cm) Weight: 247 lb (112.038 kg) IBW/kg (Calculated) : 57  Vital Signs: Temp: 99 F (37.2 C) (06/18 0539) BP: 163/91 mmHg (06/18 0539) Pulse Rate: 69 (06/18 0539) Intake/Output from previous day: 06/17 0701 - 06/18 0700 In: 1680 [P.O.:680; I.V.:1000] Out: 425 [Urine:425]  Labs:  Recent Labs  02/03/13 1140 02/04/13 0528 02/05/13 0440  WBC 12.7* 7.2 9.6  HGB 8.5* 8.6* 8.8*  PLT 296 202 210  CREATININE 2.25* 1.63* 1.07   Estimated Creatinine Clearance: 77.6 ml/min (by C-G formula based on Cr of 1.07).  Microbiology: Recent Results (from the past 720 hour(s))  CULTURE, ROUTINE-ABSCESS     Status: None   Collection Time    01/18/13  5:48 AM      Result Value Range Status   Specimen Description ABSCESS   Final   Special Requests GROIN RIGHT   Final   Gram Stain     Final   Value: NO WBC SEEN     NO SQUAMOUS EPITHELIAL CELLS SEEN     MODERATE GRAM POSITIVE COCCI     IN PAIRS IN CHAINS   Culture     Final   Value: MODERATE GROUP B STREP(S.AGALACTIAE)ISOLATED     Note: TESTING AGAINST S. AGALACTIAE NOT ROUTINELY PERFORMED DUE TO PREDICTABILITY OF AMP/PEN/VAN SUSCEPTIBILITY.   Report Status 01/21/2013 FINAL   Final  CULTURE, BLOOD (ROUTINE X 2)     Status: None   Collection Time    01/18/13  6:45 AM      Result Value Range Status   Specimen Description BLOOD LEFT ARM   Final   Special Requests BOTTLES DRAWN AEROBIC AND ANAEROBIC 10CC   Final   Culture  Setup Time 01/18/2013 11:19   Final   Culture NO  GROWTH 5 DAYS   Final   Report Status 01/24/2013 FINAL   Final  CULTURE, BLOOD (ROUTINE X 2)     Status: None   Collection Time    01/18/13  6:52 AM      Result Value Range Status   Specimen Description BLOOD LEFT HAND   Final   Special Requests BOTTLES DRAWN AEROBIC AND ANAEROBIC 10CC   Final   Culture  Setup Time 01/18/2013 11:19   Final   Culture NO GROWTH 5 DAYS   Final   Report Status 01/24/2013 FINAL   Final  SURGICAL PCR SCREEN     Status: Abnormal   Collection Time    01/24/13  5:54 AM      Result Value Range Status   MRSA, PCR NEGATIVE  NEGATIVE Final   Staphylococcus aureus POSITIVE (*) NEGATIVE Final   Comment:            The Xpert SA Assay (FDA     approved for NASAL specimens     in patients over 24 years of age),     is one component of     a comprehensive surveillance     program.  Test performance has     been validated by [employer] [facility] for patients greater     than or equal to 81 year old.     It is not intended     to diagnose infection nor to     guide or monitor treatment.  CULTURE, ROUTINE-ABSCESS     Status: None   Collection Time    01/27/13  3:50 PM      Result Value Range Status   Specimen Description ABSCESS GROIN RIGHT   Final   Special Requests Normal   Final   Gram Stain     Final   Value: FEW WBC PRESENT,BOTH PMN AND MONONUCLEAR     NO SQUAMOUS EPITHELIAL CELLS SEEN     NO ORGANISMS SEEN   Culture     Final   Value: FEW GROUP B STREP(S.AGALACTIAE)ISOLATED     Note: TESTING AGAINST S. AGALACTIAE NOT ROUTINELY PERFORMED DUE TO PREDICTABILITY OF AMP/PEN/VAN SUSCEPTIBILITY.   Report Status 01/30/2013 FINAL   Final  URINE CULTURE     Status: None   Collection Time    01/30/13  2:37 PM      Result Value Range Status   Specimen Description URINE, CATHETERIZED   Final   Special Requests Unasyn, doxycycline   Final   Culture  Setup Time 01/30/2013 15:18   Final   Colony Count NO GROWTH   Final   Culture NO GROWTH   Final   Report Status  01/31/2013 FINAL   Final  CULTURE, BLOOD (ROUTINE X 2)     Status: None   Collection Time    02/04/13  5:15 PM      Result Value Range Status   Specimen Description BLOOD RIGHT ARM   Final   Special Requests BOTTLES DRAWN AEROBIC ONLY 10CC   Final   Culture  Setup Time 02/04/2013 21:34   Final   Culture     Final   Value:        BLOOD CULTURE RECEIVED NO GROWTH TO DATE CULTURE WILL BE HELD FOR 5 DAYS BEFORE ISSUING A FINAL NEGATIVE REPORT   Report Status PENDING   Incomplete  CULTURE, BLOOD (ROUTINE X 2)     Status: None   Collection Time    02/04/13  5:25 PM      Result Value Range Status   Specimen Description BLOOD RIGHT HAND   Final   Special Requests BOTTLES DRAWN AEROBIC AND ANAEROBIC 5CC   Final   Culture  Setup Time 02/04/2013 21:36   Final   Culture     Final   Value:        BLOOD CULTURE RECEIVED NO GROWTH TO DATE CULTURE WILL BE HELD FOR 5 DAYS BEFORE ISSUING A FINAL NEGATIVE REPORT   Report Status PENDING   Incomplete   Assessment:  s/p I&D of groin abscess 6/6 & 6/10. Group B Strep from culture.  Vancomycin 6/5>>6/7  Ertapenem 6/5>>6/12  Ceftriaxone 6/12>>6/16  Doxycycline PO 6/8>>6/16  Primaxin 6/17>> Vancomycin 6/17>> Changed to Primaxin 6/17 am for gallbladder coverage. Vancomycin added 6/17 pm for right stump cellulitis.  For HIDA scan 6/18. Scr up and down, recent contrast nephropathy, but has now normalized. Afebrile, WBC 9.6. Groin wound inspected by surgery team today. Noted looks good, but some exudate, looks clean. Surgeon to evaluate for possible wound VAC. LFTs trended up, Lipitor discontinued, now trending down.  Goal of Therapy:  Vancomycin trough level 10-15 mcg/ml appropriate Primaxin dose for renal function and infection.  Plan:   Will increase Primaxin 500 mg IV from  q8hrs to q6hrs with improved renal function.   Vancomycin 2 gram IV loading dose given 6/17 ~6 pm, and 1st maintenance dose of 1500 mg IV q24h due at 5pm today. Will continue with  that dose, then change to Vancomycin 1 gram IV q12hrs.   Follow up renal funtion for any need for further adjustments.   Will consider vancomycin trough level at steady state.  Dennie Fetters, [place] Pager: [phone] 02/05/2013,2:49 PM

## 2013-02-05 NOTE — Progress Notes (Signed)
Physical Therapy Wound Treatment Patient Details  Name: [name] MRN: [id number] Date of Birth: [birth year]  Today's Date: 02/05/2013 Time: 1025-1055 Time Calculation (min): 30 min  Subjective  Subjective: Pt was completely out (only 1 percocet given) Patient and Family Stated Goals: Want to go home  Pain Score:    Wound Assessment  Pressure Ulcer 12/06/12 Stage I -  Intact skin with non-blanchable redness of a localized area usually over a bony prominence. (Active)     Wound 01/23/13 Other (Comment) Groin Right (Active)  Site / Wound Assessment Clean;Pink;Yellow 02/05/2013 11:00 AM  % Wound base Red or Granulating 95% 02/05/2013 11:00 AM  % Wound base Yellow 5% 02/05/2013 11:00 AM  Peri-wound Assessment Intact 02/05/2013 11:00 AM  Wound Length (cm) 8 cm 02/01/2013 12:11 PM  Wound Width (cm) 2 cm 02/01/2013 12:11 PM  Wound Depth (cm) 3 cm 02/01/2013 12:11 PM  Tunneling (cm) 4 02/01/2013 12:11 PM  Undermining (cm) 5.5 02/01/2013 12:11 PM  Margins Attached edges (approximated) 02/05/2013 11:00 AM  Closure None 02/05/2013 11:00 AM  Drainage Amount Minimal 02/05/2013 11:00 AM  Drainage Description Serous;Sanguineous;No odor 02/05/2013 11:00 AM  Treatment Cleansed;Hydrotherapy (Pulse lavage) 02/05/2013 11:00 AM  Dressing Type Gauze (Comment);ABD 02/05/2013 11:00 AM  Dressing Changed Changed 02/05/2013 11:00 AM  Dressing Status Intact;Clean;Dry 02/05/2013 11:00 AM     Wound 02/02/13 Abrasion(s) Coccyx Mid stage 1  ulcer (Active)  % Wound base Red or Granulating 100% 02/04/2013  8:00 AM  Wound Length (cm) 1 cm 02/02/2013  8:00 PM  Drainage Amount None 02/04/2013  8:00 AM  Treatment Cleansed 02/04/2013  8:00 PM  Dressing Type None 02/04/2013  8:00 AM     Incision 01/24/13 Groin Right (Active)  Site / Wound Assessment Clean;Dry 01/31/2013  8:30 PM  Margins Unattacted edges (unapproximated) 01/29/2013 10:29 AM  Drainage Amount Minimal 01/31/2013 10:00 AM  Drainage Description Serosanguineous  01/31/2013 10:00 AM  Treatment Cleansed 02/03/2013  5:52 AM  Dressing Type Gauze (Comment) 02/04/2013  8:00 PM  Dressing Changed;Clean 02/04/2013  8:00 PM     Incision 01/28/13 Abdomen Right (Active)  Dressing Type ABD;Gauze (Comment) 01/30/2013  8:30 AM  Dressing Clean;Dry;Intact 01/30/2013  8:30 AM   Hydrotherapy Pulsed lavage therapy - wound location: R groin Pulsed Lavage with Suction (psi): 4 psi Pulsed Lavage with Suction - Normal Saline Used: 1000 mL Pulsed Lavage Tip: Tip with splash shield Selective Debridement Selective Debridement - Location: R groin Selective Debridement - Tools Used: Forceps;Scissors Selective Debridement - Tissue Removed: yellow slough/ necrotic tissue (difficult to get more than a small amount each session )   Wound Assessment and Plan  Wound Therapy - Assess/Plan/Recommendations Wound Therapy - Clinical Statement: pt can benefit from the PLS to flush the wound and tunnels to cleanse and hopefully decr the bacterial load to promote granulation Wound Therapy - Functional Problem List: decr mobility Factors Delaying/Impairing Wound Healing: Diabetes Mellitus;Vascular compromise Hydrotherapy Plan: Debridement;Dressing change;Electrical stimulation;Pulsatile lavage with suction Wound Therapy - Frequency: 6X / week Wound Therapy - Current Recommendations: Surgery consult;PT Wound Therapy - Follow Up Recommendations: Home health RN Wound Plan: cleanse, selective debridement, clean packing to promote healthy granulation  Wound Therapy Goals- Improve the function of patient's integumentary system by progressing the wound(s) through the phases of wound healing (inflammation - proliferation - remodeling) by: Decrease Necrotic Tissue to: 0% Decrease Necrotic Tissue - Progress: Progressing toward goal Increase Granulation Tissue to: 100% Increase Granulation Tissue - Progress: Progressing toward goal Improve Drainage Characteristics: Serous Improve Drainage  Characteristics - Progress: Progressing toward goal Time For Goal Achievement: 2 weeks Wound Therapy - Potential for Goals: Good  Goals will be updated until maximal potential achieved or discharge criteria met.  Discharge criteria: when goals achieved, discharge from hospital, MD decision/surgical intervention, no progress towards goals, refusal/missing three consecutive treatments without notification or medical reason.  GP     Alicia Ackert, Eliseo Gum 02/05/2013, 11:03 AM  02/05/2013  [place] Bing, PT [phone]  (pager)

## 2013-02-06 ENCOUNTER — Inpatient Hospital Stay (HOSPITAL_COMMUNITY): Payer: Medicaid Other

## 2013-02-06 DIAGNOSIS — I10 Essential (primary) hypertension: Secondary | ICD-10-CM

## 2013-02-06 DIAGNOSIS — D649 Anemia, unspecified: Secondary | ICD-10-CM

## 2013-02-06 DIAGNOSIS — R1011 Right upper quadrant pain: Secondary | ICD-10-CM

## 2013-02-06 LAB — BASIC METABOLIC PANEL WITH GFR
BUN: 16 mg/dL (ref 6–23)
CO2: 30 meq/L (ref 19–32)
Calcium: 8.5 mg/dL (ref 8.4–10.5)
Chloride: 104 meq/L (ref 96–112)
Creatinine, Ser: 0.87 mg/dL (ref 0.50–1.10)
GFR calc Af Amer: 88 mL/min — ABNORMAL LOW (ref >90)
GFR calc non Af Amer: 76 mL/min — ABNORMAL LOW (ref >90)
Glucose, Bld: 65 mg/dL — ABNORMAL LOW (ref 70–99)
Potassium: 3.9 meq/L (ref 3.5–5.1)
Sodium: 141 meq/L (ref 135–145)

## 2013-02-06 LAB — CBC
HCT: 28.5 % — ABNORMAL LOW (ref 36.0–46.0)
Hemoglobin: 9 g/dL — ABNORMAL LOW (ref 12.0–15.0)
MCH: 27.7 pg (ref 26.0–34.0)
MCHC: 31.6 g/dL (ref 30.0–36.0)
MCV: 87.7 fL (ref 78.0–100.0)
Platelets: 208 K/uL (ref 150–400)
RBC: 3.25 MIL/uL — ABNORMAL LOW (ref 3.87–5.11)
RDW: 16.1 % — ABNORMAL HIGH (ref 11.5–15.5)
WBC: 5.4 K/uL (ref 4.0–10.5)

## 2013-02-06 LAB — GLUCOSE, CAPILLARY
Glucose-Capillary: 158 mg/dL — ABNORMAL HIGH (ref 70–99)
Glucose-Capillary: 101 mg/dL — ABNORMAL HIGH (ref 70–99)
Glucose-Capillary: 205 mg/dL — ABNORMAL HIGH (ref 70–99)

## 2013-02-06 MED ORDER — TECHNETIUM TC 99M MEBROFENIN IV KIT
5.0000 | PACK | Freq: Once | INTRAVENOUS | Status: AC | PRN
  Administered 2013-02-06: 5 via INTRAVENOUS

## 2013-02-06 MED ORDER — INSULIN DETEMIR 100 UNIT/ML ~~LOC~~ SOLN
40.0000 [IU] | Freq: Two times a day (BID) | SUBCUTANEOUS | Status: DC
  Administered 2013-02-06 – 2013-02-07 (×2): 40 [IU] via SUBCUTANEOUS
  Filled 2013-02-06 (×3): qty 0.4

## 2013-02-06 NOTE — Progress Notes (Signed)
9 Days Post-Op  Subjective: Pt feeling nauseated and complaining, denies abdominal pain.  Just came back from HIDA scan, pt was seen around 1pm, note added late   Objective: Vital signs in last 24 hours: Temp:  [98.3 F (36.8 C)-98.5 F (36.9 C)] 98.3 F (36.8 C) (06/19 1350) Pulse Rate:  [58-63] 63 (06/19 1350) Resp:  [18] 18 (06/19 1350) BP: (141-177)/(75-80) 177/75 mmHg (06/19 1350) SpO2:  [91 %-93 %] 93 % (06/19 1350) Weight:  [251 lb (113.853 kg)] 251 lb (113.853 kg) (06/19 0500) Last BM Date: 02/05/13  Intake/Output from previous day:   Intake/Output this shift: Total I/O In: 480 [P.O.:480] Out: -   General appearance: alert and oriented x3.  NAD.  VSS. Abd: RUQ tenderness to palpation.  +BS.  No h/s/m. Skin: Skin color, texture, turgor normal. No rashes or lesions or Open wound looks good, she still has some exudate but it's clean.  Left pannus excoriation, monitor for now, apply barrier cream.   Lab Results:   Recent Labs  02/05/13 0440 02/06/13 0809  WBC 9.6 5.4  HGB 8.8* 9.0*  HCT 27.7* 28.5*  PLT 210 208   BMET  Recent Labs  02/05/13 0440 02/06/13 0809  NA 139 141  K 5.2* 3.9  CL 104 104  CO2 25 30  GLUCOSE 106* 65*  BUN 25* 16  CREATININE 1.07 0.87  CALCIUM 8.4 8.5   Studies/Results: Nm Hepatobiliary  02/06/2013   *RADIOLOGY REPORT*  Clinical Data: Abnormal LFTs, thickened gallbladder wall with pericholecystic fluid on ultrasound question cholecystitis  NUCLEAR MEDICINE HEPATOHBILIARY INCLUDE GB  Radiopharmaceutical:  5 mCi Tc-38m mebrofenin  Comparison: None Correlation:  Ultrasound abdomen 02/03/2013  Findings: Prompt tracer extraction from bloodstream indicating normal hepatocellular function. Prompt excretion of tracer into biliary tree. Gallbladder is visualized at 10 minutes. Small bowel is visualized at 9 minutes. No focal hepatic retention of tracer.  IMPRESSION: Patent biliary tree. No evidence of cystic duct obstruction or acute  cholecystitis.   Original Report Authenticated By: Ulyses Southward, M.D.    Anti-infectives: Anti-infectives   Start     Dose/Rate Route Frequency Ordered Stop   02/06/13 0600  vancomycin (VANCOCIN) IVPB 1000 mg/200 mL premix     1,000 mg 200 mL/hr over 60 Minutes Intravenous Every 12 hours 02/05/13 1448     02/05/13 1700  vancomycin (VANCOCIN) 1,500 mg in sodium chloride 0.9 % 500 mL IVPB     1,500 mg 250 mL/hr over 120 Minutes Intravenous Every 24 hours 02/04/13 1629 02/06/13 1659   02/05/13 1600  imipenem-cilastatin (PRIMAXIN) 500 mg in sodium chloride 0.9 % 100 mL IVPB     500 mg 200 mL/hr over 30 Minutes Intravenous Every 6 hours 02/05/13 1446     02/04/13 1730  vancomycin (VANCOCIN) 2,000 mg in sodium chloride 0.9 % 500 mL IVPB     2,000 mg 250 mL/hr over 120 Minutes Intravenous  Once 02/04/13 1628 02/04/13 2041   02/04/13 0900  imipenem-cilastatin (PRIMAXIN) 500 mg in sodium chloride 0.9 % 100 mL IVPB  Status:  Discontinued     500 mg 200 mL/hr over 30 Minutes Intravenous Every 8 hours 02/04/13 0831 02/05/13 1446   01/30/13 1500  cefTRIAXone (ROCEPHIN) 1 g in dextrose 5 % 50 mL IVPB  Status:  Discontinued     1 g 100 mL/hr over 30 Minutes Intravenous Every 24 hours 01/30/13 1344 02/03/13 0959   01/29/13 2300  ertapenem (INVANZ) 1 g in sodium chloride 0.9 % 50 mL IVPB  Status:  Discontinued     1 g 100 mL/hr over 30 Minutes Intravenous Every 24 hours 01/29/13 1418 01/30/13 1329   01/26/13 1530  doxycycline (VIBRA-TABS) tablet 100 mg  Status:  Discontinued     100 mg Oral Every 12 hours 01/26/13 1427 02/03/13 1135   01/25/13 2300  ertapenem (INVANZ) 0.5 g in sodium chloride 0.9 % 50 mL IVPB  Status:  Discontinued     500 mg 100 mL/hr over 30 Minutes Intravenous Every 24 hours 01/25/13 1906 01/29/13 1418   01/25/13 0000  ertapenem (INVANZ) 1 g in sodium chloride 0.9 % 50 mL IVPB  Status:  Discontinued     1 g 100 mL/hr over 30 Minutes Intravenous Every 24 hours 01/23/13 2307 01/25/13  1902   01/24/13 1000  vancomycin (VANCOCIN) IVPB 1000 mg/200 mL premix  Status:  Discontinued     1,000 mg 200 mL/hr over 60 Minutes Intravenous Every 12 hours 01/23/13 2309 01/25/13 1859   01/23/13 2315  vancomycin (VANCOCIN) IVPB 1000 mg/200 mL premix     1,000 mg 200 mL/hr over 60 Minutes Intravenous  Once 01/23/13 2309 01/24/13 0314   01/23/13 2300  ertapenem (INVANZ) 1 g in sodium chloride 0.9 % 50 mL IVPB     1 g 100 mL/hr over 30 Minutes Intravenous  Once 01/23/13 2252 01/24/13 0053   01/23/13 2200  vancomycin (VANCOCIN) IVPB 1000 mg/200 mL premix     1,000 mg 200 mL/hr over 60 Minutes Intravenous  Once 01/23/13 2146 01/23/13 2311      Assessment/Plan: Right groin wound. Irrigation and debridement of the right groin Jenne Campus, MD, 01/28/2013  INCISION AND DRAINAGE ABSCESS right groin abcess, 01/24/2013, [place]is Pu. Cornett, MD.  +MRSA on nasal swab  Wound culture: GBS On primaxin and vanc RUQ abdominal pain: US revealed gallstones, negative HIDA.  The patient still exhibits RUQ pain and nausea.  I will discuss with Dr. Andrey Campanile whether a laparoscopic cholecystectomy is necessary for the symptomatic cholelithiasis.     Problems managed by Internal medicine  Diabetes mellitus Depression Right stump cellulitis HTN Hyponatremia Anemia of chronic disease   LOS: 14 days    Abi Shoults ANP-BC  02/06/2013 3:45 PM

## 2013-02-06 NOTE — Progress Notes (Signed)
Hypoglycemic Event  CBG: 62  Treatment: 15 GM carbohydrate snack  Symptoms: Hungry  Follow-up CBG: Time:1400 CBG Resutl: 89  Possible Reasons for Event: Inadequate meal intake  Comments/MD notified:Pt had been NPO for testing earlier today. Carb Mod diet has been re-ordered. Dr. Ardyth Harps was made aware.     Margy Clarks  Remember to initiate Hypoglycemia Order Set & complete

## 2013-02-06 NOTE — Progress Notes (Signed)
[facility] PROGRESS NOTE  [name] ZOX:[id number] DOB: 02-24-1961 DOA: 01/23/2013 PCP: [name], MD  Assessment/Plan: Right Groin Abscess -Cx with GBS. -Had been on doxy, but has been upgraded to vanc/invanz 2/2 cellulitis and cholecystitis. -Wound care as per surgery.  Transaminitis -Presumed 2/2 acalculous cholecystitis as evidenced on [place]. -HIDA scan without evidence for cholecystitis or cystic duct obstruction. -Await CCS recommendations, but doubt we will proceed with any interventions given negative HIDA scan. -Trending down. Pincus Sanes added 6/17 to abx regimen.  Right Stump Cellulitis -Agree with broadening of antibiotics for now.  Petechial Rash -Over right stump and LLE. -Cause remains unclear. -My recommendation will be to observe, and consider an OP punch biopsy by dermatology if no significant improvement in 2-3 weeks. -Platelet count ok at 210.  ARF -Improving.  DM II -Has been hypoglymic. -This could explain her somnolence at times. -Will further decrease levemir to 40 units.  HTN -Fair control. -No plans to change medications.  Code Status: Full code Family Communication: Patient only today. Disposition Plan: SNF once medically ready anticipate in 24-48 hours.   Consultants:  [facility], Dr. Andrey Campanile   Antibiotics:  Eli Hose   Subjective: Sleepy, but wakes up to voice and is appropriate.  Objective: Filed Vitals:   02/05/13 1400 02/05/13 2101 02/06/13 0500 02/06/13 1350  BP: 146/62 141/80  177/75  Pulse: 63 58  63  Temp: 98.2 F (36.8 C) 98.5 F (36.9 C)  98.3 F (36.8 C)  TempSrc:  Oral  Oral  Resp: 17 18  18   Height:      Weight:   113.853 kg (251 lb)   SpO2: 93% 91%  93%    Intake/Output Summary (Last 24 hours) at 02/06/13 1503 Last data filed at 02/06/13 1351  Gross per 24 hour  Intake    480 ml  Output      0 ml  Net    480 ml   Filed Weights   02/03/13 0500 02/05/13 0500 02/06/13 0500  Weight: 113.399  kg (250 lb) 112.038 kg (247 lb) 113.853 kg (251 lb)    Exam:   General:  AA Ox3  Cardiovascular: RRR  Respiratory: CTA B  Abdomen: obese, S/NT/ND/+BS  Extremities: Bilateral petechial lesions , no C/C?E   Neurologic:  Grossly intact and non-focal.  Data Reviewed: Basic Metabolic Panel:  Recent Labs Lab 02/03/13 0750 02/03/13 1140 02/04/13 0528 02/05/13 0440 02/06/13 0809  NA 137 136 141 139 141  K 5.8* 5.5* 4.7 5.2* 3.9  CL 100 100 105 104 104  CO2 25 23 26 25 30   GLUCOSE 198* 189* 115* 106* 65*  BUN 40* 42* 37* 25* 16  CREATININE 2.29* 2.25* 1.63* 1.07 0.87  CALCIUM 8.5 8.5 8.6 8.4 8.5   Liver Function Tests:  Recent Labs Lab 02/01/13 0450 02/04/13 0528 02/05/13 0440  AST 28 1322* 498*  ALT 106* 1456* 975*  ALKPHOS 222* 589* 505*  BILITOT 0.2* 0.4 0.4  PROT 6.3 6.4 6.7  ALBUMIN 1.9* 1.9* 1.9*   No results found for this basename: LIPASE, AMYLASE,  in the last 168 hours No results found for this basename: AMMONIA,  in the last 168 hours CBC:  Recent Labs Lab 02/01/13 0450 02/03/13 1140 02/04/13 0528 02/05/13 0440 02/06/13 0809  WBC 8.0 12.7* 7.2 9.6 5.4  NEUTROABS 4.7  --  4.9 8.0*  --   HGB 8.8* 8.5* 8.6* 8.8* 9.0*  HCT 27.0* 26.2* 26.4* 27.7* 28.5*  MCV 86.0 85.6 86.3 87.1  87.7  PLT 251 296 202 210 208   Cardiac Enzymes: No results found for this basename: CKTOTAL, CKMB, CKMBINDEX, TROPONINI,  in the last 168 hours BNP (last 3 results) No results found for this basename: PROBNP,  in the last 8760 hours CBG:  Recent Labs Lab 02/05/13 1638 02/05/13 2104 02/06/13 0636 02/06/13 1258 02/06/13 1337  GLUCAP 142* 158* 101* 63* 81    Recent Results (from the past 240 hour(s))  CULTURE, ROUTINE-ABSCESS     Status: None   Collection Time    01/27/13  3:50 PM      Result Value Range Status   Specimen Description ABSCESS GROIN RIGHT   Final   Special Requests Normal   Final   Gram Stain     Final   Value: FEW WBC PRESENT,BOTH PMN AND  MONONUCLEAR     NO SQUAMOUS EPITHELIAL CELLS SEEN     NO ORGANISMS SEEN   Culture     Final   Value: FEW GROUP B STREP(S.AGALACTIAE)ISOLATED     Note: TESTING AGAINST S. AGALACTIAE NOT ROUTINELY PERFORMED DUE TO PREDICTABILITY OF AMP/PEN/VAN SUSCEPTIBILITY.   Report Status 01/30/2013 FINAL   Final  URINE CULTURE     Status: None   Collection Time    01/30/13  2:37 PM      Result Value Range Status   Specimen Description URINE, CATHETERIZED   Final   Special Requests Unasyn, doxycycline   Final   Culture  Setup Time 01/30/2013 15:18   Final   Colony Count NO GROWTH   Final   Culture NO GROWTH   Final   Report Status 01/31/2013 FINAL   Final  CULTURE, BLOOD (ROUTINE X 2)     Status: None   Collection Time    02/04/13  5:15 PM      Result Value Range Status   Specimen Description BLOOD RIGHT ARM   Final   Special Requests BOTTLES DRAWN AEROBIC ONLY 10CC   Final   Culture  Setup Time 02/04/2013 21:34   Final   Culture     Final   Value:        BLOOD CULTURE RECEIVED NO GROWTH TO DATE CULTURE WILL BE HELD FOR 5 DAYS BEFORE ISSUING A FINAL NEGATIVE REPORT   Report Status PENDING   Incomplete  CULTURE, BLOOD (ROUTINE X 2)     Status: None   Collection Time    02/04/13  5:25 PM      Result Value Range Status   Specimen Description BLOOD RIGHT HAND   Final   Special Requests BOTTLES DRAWN AEROBIC AND ANAEROBIC 5CC   Final   Culture  Setup Time 02/04/2013 21:36   Final   Culture     Final   Value:        BLOOD CULTURE RECEIVED NO GROWTH TO DATE CULTURE WILL BE HELD FOR 5 DAYS BEFORE ISSUING A FINAL NEGATIVE REPORT   Report Status PENDING   Incomplete     Studies: Nm Hepatobiliary  02/06/2013   *RADIOLOGY REPORT*  Clinical Data: Abnormal LFTs, thickened gallbladder wall with pericholecystic fluid on ultrasound question cholecystitis  NUCLEAR MEDICINE HEPATOHBILIARY INCLUDE GB  Radiopharmaceutical:  5 mCi Tc-65m mebrofenin  Comparison: None Correlation:  Ultrasound abdomen 02/03/2013   Findings: Prompt tracer extraction from bloodstream indicating normal hepatocellular function. Prompt excretion of tracer into biliary tree. Gallbladder is visualized at 10 minutes. Small bowel is visualized at 9 minutes. No focal hepatic retention of tracer.  IMPRESSION: Patent biliary tree.  No evidence of cystic duct obstruction or acute cholecystitis.   Original Report Authenticated By: Ulyses Southward, M.D.    Scheduled Meds: . amLODipine  5 mg Oral Daily  . feeding supplement  237 mL Oral BID BM  . FLUoxetine  20 mg Oral Daily  . gabapentin  200 mg Oral TID  . heparin subcutaneous  5,000 Units Subcutaneous Q8H  . imipenem-cilastatin  500 mg Intravenous Q6H  . insulin aspart  0-20 Units Subcutaneous TID WC  . insulin aspart  0-5 Units Subcutaneous QHS  . insulin detemir  50 Units Subcutaneous BID  . levothyroxine  25 mcg Oral QAC breakfast  . metoCLOPramide  5 mg Oral TID WC  . oxybutynin  5 mg Oral Daily  . pantoprazole  40 mg Oral Daily  . triamcinolone cream   Topical BID  . vancomycin  1,500 mg Intravenous Q24H  . vancomycin  1,000 mg Intravenous Q12H   Continuous Infusions: . sodium chloride 100 mL/hr at 02/06/13 0321  . lactated ringers 50 mL/hr at 01/28/13 1610    Principal Problem:   Abscess of groin, right Active Problems:   Diabetes mellitus type 2, uncontrolled, with complications   DEPRESSION   PERIPHERAL NEUROPATHY   HYPERCHOLESTEROLEMIA   Hyponatremia   HTN (hypertension)   Leukocytosis   Anemia of chronic disease   Hypothyroidism   ARF (acute renal failure)   Proteinuria    Time spent: 35 minutes    HERNANDEZ ACOSTA,Kyser Wandel  [facility] Pager [phone]  If 7PM-7AM, please contact night-coverage at [email or web address], password [facility] 02/06/2013, 3:03 PM  LOS: 14 days

## 2013-02-06 NOTE — Progress Notes (Signed)
Pt seen and examined  Denies abd pain. Pt groggy.  Obese, soft, some TTP in RUQ. No rebound/guarding  Elevated LFTs - ?etiology U/s showed thickened wall without stones and sludge suggestive of acalculous cholecystitis However, HIDA was normal. No evidence of cholecystitis.  lfts did trend down today  For Now, i would continue to trend the LFTs. Pt has not had fever, tachycardia, and has normal wbc Will follow wound and abd pain  Mary Sella. Andrey Campanile, MD, FACS General, Bariatric, & Minimally Invasive Surgery [facility], [place]

## 2013-02-07 DIAGNOSIS — J9600 Acute respiratory failure, unspecified whether with hypoxia or hypercapnia: Secondary | ICD-10-CM

## 2013-02-07 LAB — COMPREHENSIVE METABOLIC PANEL
ALT: 483 U/L — ABNORMAL HIGH (ref 0–35)
Albumin: 2 g/dL — ABNORMAL LOW (ref 3.5–5.2)
Alkaline Phosphatase: 379 U/L — ABNORMAL HIGH (ref 39–117)
BUN: 11 mg/dL (ref 6–23)
Chloride: 105 mEq/L (ref 96–112)
GFR calc Af Amer: >90 mL/min (ref >90)
Glucose, Bld: 100 mg/dL — ABNORMAL HIGH (ref 70–99)
Potassium: 3.5 mEq/L (ref 3.5–5.1)
Sodium: 143 mEq/L (ref 135–145)
Total Bilirubin: 0.4 mg/dL (ref 0.3–1.2)
Total Protein: 6.5 g/dL (ref 6.0–8.3)

## 2013-02-07 LAB — CBC
HCT: 27.5 % — ABNORMAL LOW (ref 36.0–46.0)
MCHC: 32.4 g/dL (ref 30.0–36.0)
MCV: 87.6 fL (ref 78.0–100.0)
Platelets: 218 10*3/uL (ref 150–400)
RDW: 16.4 % — ABNORMAL HIGH (ref 11.5–15.5)
WBC: 5 10*3/uL (ref 4.0–10.5)

## 2013-02-07 LAB — GLUCOSE, CAPILLARY
Glucose-Capillary: 103 mg/dL — ABNORMAL HIGH (ref 70–99)
Glucose-Capillary: 143 mg/dL — ABNORMAL HIGH (ref 70–99)

## 2013-02-07 MED ORDER — INSULIN DETEMIR 100 UNIT/ML ~~LOC~~ SOLN: 40.0000 [IU] | Freq: Two times a day (BID) | SUBCUTANEOUS | Status: DC

## 2013-02-07 MED ORDER — OXYCODONE-ACETAMINOPHEN 5-325 MG PO TABS: 1.0000 | ORAL_TABLET | Freq: Four times a day (QID) | ORAL | Status: DC | PRN

## 2013-02-07 MED ORDER — DOXYCYCLINE HYCLATE 100 MG PO TABS
100.0000 mg | ORAL_TABLET | Freq: Two times a day (BID) | ORAL | Status: DC
  Filled 2013-02-07 (×2): qty 1

## 2013-02-07 MED ORDER — DOXYCYCLINE HYCLATE 100 MG PO TABS: 100.0000 mg | ORAL_TABLET | Freq: Two times a day (BID) | ORAL | Status: AC

## 2013-02-07 MED ORDER — AMLODIPINE BESYLATE 5 MG PO TABS: 5.0000 mg | ORAL_TABLET | Freq: Every day | ORAL | Status: DC

## 2013-02-07 MED ORDER — ONDANSETRON HCL 4 MG PO TABS: 4.0000 mg | ORAL_TABLET | Freq: Four times a day (QID) | ORAL | Status: DC | PRN

## 2013-02-07 NOTE — Progress Notes (Signed)
10 Days Post-Op  Subjective: Denies pain, nausea has resolved.  Completed hydrotherapy today.  Mom at bedside.  Objective: Vital signs in last 24 hours: Temp:  [97.9 F (36.6 C)-98.3 F (36.8 C)] 98.2 F (36.8 C) (06/20 0500) Pulse Rate:  [63-64] 64 (06/20 0500) Resp:  [18-20] 20 (06/20 0500) BP: (156-177)/(59-75) 160/62 mmHg (06/20 0500) SpO2:  [90 %-93 %] 90 % (06/20 0500) Weight:  [248 lb 6.4 oz (112.674 kg)] 248 lb 6.4 oz (112.674 kg) (06/20 0410) Last BM Date: 02/06/13  Intake/Output from previous day: 06/19 0701 - 06/20 0700 In: 1860 [P.O.:960; I.V.:900] Out: 1 [Urine:1] Intake/Output this shift:   PE General appearance: alert and oriented x3. NAD. VSS.  Abd: no tenderness or organomegaly.. +BS. No h/s/m.  Skin: Skin color, texture, turgor normal. No rashes or lesions or Open wound looks good, she still has some exudate but it's clean. Left pannus excoriation, monitor for now, apply barrier cream. 8x2x3cm right groin wound with minimal serous, serosanguinous drainage.  There is an area of tunneling 1 oclock that is 2cm.  Lab Results:   Recent Labs  02/06/13 0809 02/07/13 0505  WBC 5.4 5.0  HGB 9.0* 8.9*  HCT 28.5* 27.5*  PLT 208 218   BMET  Recent Labs  02/06/13 0809 02/07/13 0505  NA 141 143  K 3.9 3.5  CL 104 105  CO2 30 29  GLUCOSE 65* 100*  BUN 16 11  CREATININE 0.87 0.78  CALCIUM 8.5 8.5    Studies/Results: Nm Hepatobiliary  02/06/2013   *RADIOLOGY REPORT*  Clinical Data: Abnormal LFTs, thickened gallbladder wall with pericholecystic fluid on ultrasound question cholecystitis  NUCLEAR MEDICINE HEPATOHBILIARY INCLUDE GB  Radiopharmaceutical:  5 mCi Tc-14m mebrofenin  Comparison: None Correlation:  Ultrasound abdomen 02/03/2013  Findings: Prompt tracer extraction from bloodstream indicating normal hepatocellular function. Prompt excretion of tracer into biliary tree. Gallbladder is visualized at 10 minutes. Small bowel is visualized at 9 minutes. No  focal hepatic retention of tracer.  IMPRESSION: Patent biliary tree. No evidence of cystic duct obstruction or acute cholecystitis.   Original Report Authenticated By: Ulyses Southward, M.D.    Anti-infectives: Anti-infectives   Start     Dose/Rate Route Frequency Ordered Stop   02/07/13 1045  doxycycline (VIBRA-TABS) tablet 100 mg     100 mg Oral Every 12 hours 02/07/13 1024 02/21/13 0959   02/06/13 0600  vancomycin (VANCOCIN) IVPB 1000 mg/200 mL premix     1,000 mg 200 mL/hr over 60 Minutes Intravenous Every 12 hours 02/05/13 1448     02/05/13 1700  vancomycin (VANCOCIN) 1,500 mg in sodium chloride 0.9 % 500 mL IVPB     1,500 mg 250 mL/hr over 120 Minutes Intravenous Every 24 hours 02/04/13 1629 02/06/13 1659   02/05/13 1600  imipenem-cilastatin (PRIMAXIN) 500 mg in sodium chloride 0.9 % 100 mL IVPB     500 mg 200 mL/hr over 30 Minutes Intravenous Every 6 hours 02/05/13 1446     02/04/13 1730  vancomycin (VANCOCIN) 2,000 mg in sodium chloride 0.9 % 500 mL IVPB     2,000 mg 250 mL/hr over 120 Minutes Intravenous  Once 02/04/13 1628 02/04/13 2041   02/04/13 0900  imipenem-cilastatin (PRIMAXIN) 500 mg in sodium chloride 0.9 % 100 mL IVPB  Status:  Discontinued     500 mg 200 mL/hr over 30 Minutes Intravenous Every 8 hours 02/04/13 0831 02/05/13 1446   01/30/13 1500  cefTRIAXone (ROCEPHIN) 1 g in dextrose 5 % 50 mL IVPB  Status:  Discontinued     1 g 100 mL/hr over 30 Minutes Intravenous Every 24 hours 01/30/13 1344 02/03/13 0959   01/29/13 2300  ertapenem (INVANZ) 1 g in sodium chloride 0.9 % 50 mL IVPB  Status:  Discontinued     1 g 100 mL/hr over 30 Minutes Intravenous Every 24 hours 01/29/13 1418 01/30/13 1329   01/26/13 1530  doxycycline (VIBRA-TABS) tablet 100 mg  Status:  Discontinued     100 mg Oral Every 12 hours 01/26/13 1427 02/03/13 1135   01/25/13 2300  ertapenem (INVANZ) 0.5 g in sodium chloride 0.9 % 50 mL IVPB  Status:  Discontinued     500 mg 100 mL/hr over 30 Minutes  Intravenous Every 24 hours 01/25/13 1906 01/29/13 1418   01/25/13 0000  ertapenem (INVANZ) 1 g in sodium chloride 0.9 % 50 mL IVPB  Status:  Discontinued     1 g 100 mL/hr over 30 Minutes Intravenous Every 24 hours 01/23/13 2307 01/25/13 1902   01/24/13 1000  vancomycin (VANCOCIN) IVPB 1000 mg/200 mL premix  Status:  Discontinued     1,000 mg 200 mL/hr over 60 Minutes Intravenous Every 12 hours 01/23/13 2309 01/25/13 1859   01/23/13 2315  vancomycin (VANCOCIN) IVPB 1000 mg/200 mL premix     1,000 mg 200 mL/hr over 60 Minutes Intravenous  Once 01/23/13 2309 01/24/13 0314   01/23/13 2300  ertapenem (INVANZ) 1 g in sodium chloride 0.9 % 50 mL IVPB     1 g 100 mL/hr over 30 Minutes Intravenous  Once 01/23/13 2252 01/24/13 0053   01/23/13 2200  vancomycin (VANCOCIN) IVPB 1000 mg/200 mL premix     1,000 mg 200 mL/hr over 60 Minutes Intravenous  Once 01/23/13 2146 01/23/13 2311      Assessment/Plan: Right groin wound. Irrigation and debridement of the right groin Jenne Campus, MD, 01/28/2013  INCISION AND DRAINAGE ABSCESS right groin abcess, 01/24/2013, Clovis Pu. Cornett, MD.  Wound culture: GBS atbx -stable for discharge -her wound looks very good.  Continue with BID NS wet to dry dressing changes.  Follow up with Dr. Magnus Ivan in 2 weeks.  Office has been contacted to schedule the appt.   RUQ abdominal pain: US revealed gallstones, negative HIDA.  LFTs are trending down.  She is symptoms free today.  Would recommend obtaining hepatitis panel.

## 2013-02-07 NOTE — Progress Notes (Signed)
Physical Therapy Wound Treatment  @@@  HYDROTHERAPY to SIGN UYQ@@@  Patient Details  Name: [name] MRN: [id number] Date of Birth: 03-16-1961  Today's Date: 02/07/2013 Time: 6387-5643 Time Calculation (min): 33 min  Subjective  Subjective: So it looks good? 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 (Active)     Wound 01/23/13 Other (Comment) Groin Right (Active)  Site / Wound Assessment Clean;Pink;Yellow 02/07/2013  9:35 AM  % Wound base Red or Granulating 95% 02/07/2013  9:35 AM  % Wound base Yellow 5% 02/05/2013 11:00 AM  Peri-wound Assessment Intact 02/07/2013  9:35 AM  Wound Length (cm) 8 cm 02/01/2013 12:11 PM  Wound Width (cm) 2 cm 02/01/2013 12:11 PM  Wound Depth (cm) 3 cm 02/01/2013 12:11 PM  Tunneling (cm) 4 02/01/2013 12:11 PM  Undermining (cm) 5.5 02/01/2013 12:11 PM  Margins Attached edges (approximated) 02/07/2013  9:35 AM  Closure None 02/07/2013  9:35 AM  Drainage Amount Minimal 02/07/2013  9:35 AM  Drainage Description Serous;Sanguineous;No odor 02/07/2013  9:35 AM  Treatment Cleansed;Hydrotherapy (Pulse lavage) 02/05/2013 11:00 AM  Dressing Type Gauze (Comment);ABD 02/07/2013  9:35 AM  Dressing Changed Changed 02/05/2013 11:00 AM  Dressing Status Intact;Clean;Dry 02/07/2013  9:35 AM     Wound 02/02/13 Abrasion(s) Coccyx Mid stage 1  ulcer (Active)  % Wound base Red or Granulating 100% 02/04/2013  8:00 AM  Wound Length (cm) 1 cm 02/02/2013  8:00 PM  Drainage Amount None 02/04/2013  8:00 AM  Treatment Cleansed 02/04/2013  8:00 PM  Dressing Type None 02/04/2013  8:00 AM     Incision 01/24/13 Groin Right (Active)  Site / Wound Assessment Clean;Dry 01/31/2013  8:30 PM  Margins Unattacted edges (unapproximated) 01/29/2013 10:29 AM  Drainage Amount Minimal 01/31/2013 10:00 AM  Drainage Description  Serosanguineous 01/31/2013 10:00 AM  Treatment Cleansed 02/03/2013  5:52 AM  Dressing Type Gauze (Comment) 02/06/2013  8:00 PM  Dressing Changed;Clean 02/06/2013  8:00 PM     Incision 01/28/13 Abdomen Right (Active)  Dressing Type ABD;Gauze (Comment) 01/30/2013  8:30 AM  Dressing Clean;Dry;Intact 01/30/2013  8:30 AM   Hydrotherapy Pulsed lavage therapy - wound location: R groin Pulsed Lavage with Suction (psi): 4 psi Pulsed Lavage with Suction - Normal Saline Used: 1000 mL Pulsed Lavage Tip: Tip with splash shield Selective Debridement Selective Debridement - Location: R groin Selective Debridement - Tools Used: Forceps;Scissors Selective Debridement - Tissue Removed: yellow slough (virtually 100% granulated)   Wound Assessment and Plan  Wound Therapy - Assess/Plan/Recommendations Wound Therapy - Clinical Statement: pt can benefit from the PLS to flush the wound and tunnels to cleanse and hopefully decr the bacterial load to promote granulation Wound Therapy - Functional Problem List: decr mobility Factors Delaying/Impairing Wound Healing: Diabetes Mellitus;Vascular compromise Hydrotherapy Plan: Debridement;Dressing change;Electrical stimulation;Pulsatile lavage with suction Wound Therapy - Frequency: 6X / week Wound Therapy - Current Recommendations: Surgery consult;PT Wound Therapy - Follow Up Recommendations: Skilled nursing facility Wound Plan: cleanse, selective debridement, clean packing to promote healthy granulation  Wound Therapy Goals- Improve the function of patient's integumentary system by progressing the wound(s) through the phases of wound healing (inflammation - proliferation - remodeling) by: Decrease Necrotic Tissue to: 0% Decrease Necrotic Tissue - Progress: Partly met Increase Granulation Tissue to: 100% Increase Granulation Tissue - Progress: Partly met Improve Drainage Characteristics: Serous Improve  Drainage Characteristics - Progress: Met Goals/treatment  plan/discharge plan were made with and agreed upon by patient/family: Yes Time For Goal Achievement: 2 weeks Wound Therapy - Potential for Goals: Good  Goals will be updated until maximal potential achieved or discharge criteria met.  Discharge criteria: when goals achieved, discharge from hospital, MD decision/surgical intervention, no progress towards goals, refusal/missing three consecutive treatments without notification or medical reason.  GP     Shalynn Jorstad, Eliseo Gum 02/07/2013, 9:38 AM 02/07/2013  Waucoma Bing, PT [phone]  (pager)

## 2013-02-07 NOTE — Progress Notes (Signed)
Clinical social worker assisted with patient discharge to skilled nursing facility,[facility] .  CSW addressed all family questions and concerns. CSW copied chart and added all important documents. CSW also set up patient transportation with [employer]. Clinical Social Worker will sign off for now as social work intervention is no longer needed.   Sabino Niemann, MSW [phone]

## 2013-02-07 NOTE — Discharge Summary (Signed)
Physician Discharge Summary  [name] YNW:[id number] DOB: 05-10-1961 DOA: 01/23/2013  PCP: Nichole Creek, MD  Admit date: 01/23/2013 Discharge date: 02/07/2013  Time spent: Greater than 30 minutes  Recommendations for Outpatient Follow-up:  -Will be discharged to SNF for rehab purposes. -Needs to follow up with surgery, Dr. Andrey Campanile, in 1 week for her right groin abscess. -LFTs should be rechecked in 1 weeks to ensure continued downward trend.  -If petechial rash over right stump and LLE continue, consider dermatology referral for biopsy.  Discharge Diagnoses:  Principal Problem:   Abscess of groin, right Active Problems:   Diabetes mellitus type 2, uncontrolled, with complications   DEPRESSION   PERIPHERAL NEUROPATHY   HYPERCHOLESTEROLEMIA   Hyponatremia   HTN (hypertension)   Leukocytosis   Anemia of chronic disease   Hypothyroidism   ARF (acute renal failure)   Proteinuria   Discharge Condition: Stable and improved.  Filed Weights   02/05/13 0500 02/06/13 0500 02/07/13 0410  Weight: 112.038 kg (247 lb) 113.853 kg (251 lb) 112.674 kg (248 lb 6.4 oz)    History of present illness:  Patient is a 52 y.o. female with a history of diabetes mellitus, insulin dependent who present to [facility] with main concerns persistent right inguinal abscess which was drained 5 days prior to this admission in the emergency department, and continues to drain yellow pus, she was discharged on Clindamycin ut explains it has not been getting much better. She describes throbbing pain un the right inguinal area, intermittent and 5/10 in severity, radiating throughout groin area, no specific alleviating or aggravating factors. Pt denies fevers, chills, other systemic concerns. We were asked to admit her for further evaluation and management.   Hospital Course:   Right Groin Abscess  -Cx with GBS.  -Plan for doxycycline for 14 days. -Will need follow up with general surgery in 1 week. -Did  receive hydrotherapy while in the hospital, but this has been discontinued.  Transaminitis  -Initially Presumed 2/2 acalculous cholecystitis as evidenced on [place]; however, HIDA scan without evidence for cholecystitis or cystic duct obstruction.  -Could have been statin-induced. -Trending down.  -Would advise rechecking LFTs in 1 week to ensure continued downward trend.  Right Stump Cellulitis  -Resolved on broad spectrum antibiotics.  Petechial Rash  -Over right stump and LLE.  -Cause remains unclear.  -My recommendation will be to observe, and consider an OP punch biopsy by dermatology if no significant improvement in 2-3 weeks.  -Platelet count ok at 218.   ARF  -Resolved with IVF. -Believed 2/2 prerenal azotemia.  DM II  -Has been hypoglymic.  -Levemir has been decreased to 40 units by time of DC.   HTN  -Fair control.  -No plans to change medications.  Acute Encephalopathy/Hypoxemia -Resolved. -Thought to be narcotic-induced; so would be careful with narcotic prescription while at SNF and after DC from SNF.   Procedures:  None   Consultations:  Surgery, Dr. Andrey Campanile  Discharge Instructions  Discharge Orders   Future Orders Complete By Expires     Diet - low sodium heart healthy  As directed     Discontinue IV  As directed     Increase activity slowly  As directed         Medication List    STOP taking these medications       atorvastatin 80 MG tablet  Commonly known as:  LIPITOR     clindamycin 150 MG capsule  Commonly known as:  CLEOCIN  gemfibrozil 600 MG tablet  Commonly known as:  LOPID     hydrochlorothiazide 12.5 MG capsule  Commonly known as:  MICROZIDE     HYDROcodone-acetaminophen 10-325 MG per tablet  Commonly known as:  NORCO     insulin aspart 100 UNIT/ML injection  Commonly known as:  novoLOG     ondansetron 4 MG disintegrating tablet  Commonly known as:  ZOFRAN ODT      TAKE these medications       amLODipine 5 MG tablet   Commonly known as:  NORVASC  Take 1 tablet (5 mg total) by mouth daily.     FLUoxetine 20 MG tablet  Commonly known as:  PROZAC  Take 20 mg by mouth daily.     gabapentin 800 MG tablet  Commonly known as:  NEURONTIN  Take 800 mg by mouth 3 (three) times daily.     insulin detemir 100 UNIT/ML injection  Commonly known as:  LEVEMIR  Inject 0.4 mLs (40 Units total) into the skin 2 (two) times daily.     levothyroxine 25 MCG tablet  Commonly known as:  SYNTHROID, LEVOTHROID  Take 25 mcg by mouth every morning.     metoCLOPramide 5 MG tablet  Commonly known as:  REGLAN  Take 5 mg by mouth 3 (three) times daily.     omeprazole 20 MG capsule  Commonly known as:  PRILOSEC  Take 20 mg by mouth daily.     ondansetron 4 MG tablet  Commonly known as:  ZOFRAN  Take 1 tablet (4 mg total) by mouth every 6 (six) hours as needed for nausea.     oxybutynin 5 MG tablet  Commonly known as:  DITROPAN  Take 5 mg by mouth every morning.     oxyCODONE-acetaminophen 5-325 MG per tablet  Commonly known as:  PERCOCET/ROXICET  Take 1 tablet by mouth every 6 (six) hours as needed for pain.       Allergies  Allergen Reactions  . Gadolinium      Code: VOM, Desc: Pt began vomiting immed post infusion of multihance, Onset Date: [id number]   . Ivp Dye (Iodinated Diagnostic Agents) Nausea And Vomiting  . Metformin Other (See Comments)     diarrhea  . Penicillins Hives      The results of significant diagnostics from this hospitalization (including imaging, microbiology, ancillary and laboratory) are listed below for reference.    Significant Diagnostic Studies: Ct Pelvis W Contrast  01/23/2013   *RADIOLOGY REPORT*  Clinical Data:  Right groin boil lanced 5 days ago with persistent pain and drainage from the area  CT PELVIS WITH CONTRAST  Technique:  Multidetector CT imaging of the pelvis was performed using the standard protocol following the bolus administration of intravenous contrast.   Contrast: OMNIPAQUE IOHEXOL 300 MG/ML  SOLN  Comparison:   None.  Findings:  Visualized bowel is unremarkable.  Uterus and ovaries are normal.  Bladder is normal.  No intrapelvic lymphadenopathy or free fluid.  Prominent right groin lymph nodes are noted measuring 1.4 cm in maximal short axis diameter image 39.  There is fusiform enlargement of the gracilis muscle and adductor longus muscle on the right, with overlying subcutaneous soft tissue edema.  Areas of hypo enhancement are noted within these muscles with mild peripheral enhancement which may indicate abscess formation, measuring 1.7 cm within the abductor longus muscle and 1.6 cm within the gracilis muscle.  There is minimal fluid overlying the gracilis muscle subjacent to the dermis but  this itself is likely too small for percutaneous drainage, for example image 55 measuring 8 mm.  No acute osseous abnormality.  IMPRESSION: Fusiform enlargement of the right adductor longus and gracilis muscles with overlying skin edema and fluid most compatible with cellulitis and myositis with internal areas of probable abscess formation.  No subcutaneous or dermal drainable fluid collection.   Original Report Authenticated By: Christiana Pellant, M.D.   Nm Hepatobiliary  02/06/2013   *RADIOLOGY REPORT*  Clinical Data: Abnormal LFTs, thickened gallbladder wall with pericholecystic fluid on ultrasound question cholecystitis  NUCLEAR MEDICINE HEPATOHBILIARY INCLUDE GB  Radiopharmaceutical:  5 mCi Tc-71m mebrofenin  Comparison: None Correlation:  Ultrasound abdomen 02/03/2013  Findings: Prompt tracer extraction from bloodstream indicating normal hepatocellular function. Prompt excretion of tracer into biliary tree. Gallbladder is visualized at 10 minutes. Small bowel is visualized at 9 minutes. No focal hepatic retention of tracer.  IMPRESSION: Patent biliary tree. No evidence of cystic duct obstruction or acute cholecystitis.   Original Report Authenticated By: Ulyses Southward, M.D.   US Abdomen Complete  02/03/2013   *RADIOLOGY REPORT*  Clinical Data:  52 year old female with abnormal LFTs.  Diabetes, cellulitis.  COMPLETE ABDOMINAL ULTRASOUND  Comparison:  Pelvis CT 01/23/2013.  Renal ultrasound 01/25/2013.  Findings:  Gallbladder:  Positive wall thickening up to 12 mm.  Trace pericholecystic fluid.  No sludge or stones identified.  No sonographic Murphy's sign elicited.  Common bile duct:  Normal measuring 2-3 mm diameter.  Liver:  Echotexture within normal limits.  No intrahepatic ductal dilatation.  No discrete liver lesion identified.  IVC:  Incompletely visualized due to overlying bowel gas, visualized portions within normal limits.  Pancreas:  Incompletely visualized due to overlying bowel gas, visualized portions within normal limits.  Spleen:  Echotexture within normal limits.  Splenomegaly. Calculated splenic volume of 646 ml (Normal splenic volume range 83 - 412 mL). No discrete lesion identified.  Right Kidney:  Normal measuring 13.5 cm in length.  Left Kidney:  Normal measuring 13.6 cm in length.  Abdominal aorta:  Incompletely visualized due to overlying bowel gas, visualized portions within normal limits.  IMPRESSION: 1.  Abnormal gallbladder wall thickening and pericholecystic fluid. No gallstones identified.  Despite the absence of a sonographic Murphy's sign, the appearance is suspicious for acute acalculus cholecystitis. 2.  Splenomegaly without focal splenic lesion.  Perhaps this is reactive.  Study discussed by telephone with Provider L. Easterwood) on 02/03/2013 at 2035 hours.   Original Report Authenticated By: Erskine Speed, M.D.   US Renal  01/25/2013   *RADIOLOGY REPORT*  Clinical Data:  Acute renal failure  RENAL/URINARY TRACT ULTRASOUND COMPLETE  Comparison:  Prior renal ultrasound 10/07/2011  Findings:  Right Kidney:  Normal in size (13.4 cm) and parenchymal echogenicity.  No evidence of mass or hydronephrosis.  Left Kidney:  Normal in size (13.1 cm)  and parenchymal echogenicity.  No evidence of mass or hydronephrosis.  Bladder:  Decompressed with a Foley catheter in place.  IMPRESSION:  Normal study.  Foley catheter noted in the decompressed bladder.   Original Report Authenticated By: Malachy Moan, M.D.   Nm Pulmonary Perf And Vent  01/31/2013   *RADIOLOGY REPORT*  Clinical Data:  Short of breath, concern for pulmonary embolism.  NUCLEAR MEDICINE VENTILATION - PERFUSION LUNG SCAN  Technique:  Ventilation images were obtained in multiple projections using inhaled aerosol technetium 99 M DTPA.  Perfusion images were obtained in multiple projections after intravenous injection of Tc-83m MAA.  Radiopharmaceuticals:  Tc-56m DTPA  aerosol and six mCi Tc-50m MAA.  Comparison: Chest radiograph 01/30/2013  Findings:  Ventilation:   No focal ventilation defect.  Perfusion:   No wedge shaped peripheral perfusion defects to suggest acute pulmonary embolism  IMPRESSION: Very low probability for acute pulmonary embolism.   Original Report Authenticated By: Genevive Bi, M.D.   Dg Chest Port 1 View  01/30/2013   *RADIOLOGY REPORT*  Clinical Data: Shortness of breath and hypoxia.  PORTABLE CHEST - 1 VIEW  Comparison: 12/05/2012  Findings: Portable view of the chest was obtained.  Heart size is upper limits normal but may be related to the technique.  Slightly prominent lung markings without focal airspace disease or pulmonary edema. Trachea midline.  Slightly increased density along the anterior aspect of the left seventh rib probably represent overlying shadows and similar to the previous examination.  There may be subsegmental atelectasis in the medial right lower chest.  IMPRESSION: No focal chest disease.  Probable atelectasis in the right lung.  Few densities at the left lung base and near the anterior left seventh rib as described.  Findings probable represent overlying shadows but recommend further evaluation with a two view chest exam.   Original  Report Authenticated By: Richarda Overlie, M.D.    Microbiology: Recent Results (from the past 240 hour(s))  URINE CULTURE     Status: None   Collection Time    01/30/13  2:37 PM      Result Value Range Status   Specimen Description URINE, CATHETERIZED   Final   Special Requests Unasyn, doxycycline   Final   Culture  Setup Time 01/30/2013 15:18   Final   Colony Count NO GROWTH   Final   Culture NO GROWTH   Final   Report Status 01/31/2013 FINAL   Final  CULTURE, BLOOD (ROUTINE X 2)     Status: None   Collection Time    02/04/13  5:15 PM      Result Value Range Status   Specimen Description BLOOD RIGHT ARM   Final   Special Requests BOTTLES DRAWN AEROBIC ONLY 10CC   Final   Culture  Setup Time 02/04/2013 21:34   Final   Culture     Final   Value:        BLOOD CULTURE RECEIVED NO GROWTH TO DATE CULTURE WILL BE HELD FOR 5 DAYS BEFORE ISSUING A FINAL NEGATIVE REPORT   Report Status PENDING   Incomplete  CULTURE, BLOOD (ROUTINE X 2)     Status: None   Collection Time    02/04/13  5:25 PM      Result Value Range Status   Specimen Description BLOOD RIGHT HAND   Final   Special Requests BOTTLES DRAWN AEROBIC AND ANAEROBIC 5CC   Final   Culture  Setup Time 02/04/2013 21:36   Final   Culture     Final   Value:        BLOOD CULTURE RECEIVED NO GROWTH TO DATE CULTURE WILL BE HELD FOR 5 DAYS BEFORE ISSUING A FINAL NEGATIVE REPORT   Report Status PENDING   Incomplete     Labs: Basic Metabolic Panel:  Recent Labs Lab 02/03/13 1140 02/04/13 0528 02/05/13 0440 02/06/13 0809 02/07/13 0505  NA 136 141 139 141 143  K 5.5* 4.7 5.2* 3.9 3.5  CL 100 105 104 104 105  CO2 23 26 25 30 29   GLUCOSE 189* 115* 106* 65* 100*  BUN 42* 37* 25* 16 11  CREATININE 2.25* 1.63* 1.07 0.87  0.78  CALCIUM 8.5 8.6 8.4 8.5 8.5   Liver Function Tests:  Recent Labs Lab 02/01/13 0450 02/04/13 0528 02/05/13 0440 02/07/13 0505  AST 28 1322* 498* 139*  ALT 106* 1456* 975* 483*  ALKPHOS 222* 589* 505* 379*   BILITOT 0.2* 0.4 0.4 0.4  PROT 6.3 6.4 6.7 6.5  ALBUMIN 1.9* 1.9* 1.9* 2.0*   No results found for this basename: LIPASE, AMYLASE,  in the last 168 hours No results found for this basename: AMMONIA,  in the last 168 hours CBC:  Recent Labs Lab 02/01/13 0450 02/03/13 1140 02/04/13 0528 02/05/13 0440 02/06/13 0809 02/07/13 0505  WBC 8.0 12.7* 7.2 9.6 5.4 5.0  NEUTROABS 4.7  --  4.9 8.0*  --   --   HGB 8.8* 8.5* 8.6* 8.8* 9.0* 8.9*  HCT 27.0* 26.2* 26.4* 27.7* 28.5* 27.5*  MCV 86.0 85.6 86.3 87.1 87.7 87.6  PLT 251 296 202 210 208 218   Cardiac Enzymes: No results found for this basename: CKTOTAL, CKMB, CKMBINDEX, TROPONINI,  in the last 168 hours BNP: BNP (last 3 results) No results found for this basename: PROBNP,  in the last 8760 hours CBG:  Recent Labs Lab 02/06/13 1337 02/06/13 1619 02/06/13 2051 02/07/13 0433 02/07/13 0816  GLUCAP 81 205* 206* 96 103*       Signed:  HERNANDEZ ACOSTA,Raider Valbuena  [facility] Pager: [phone] 02/07/2013, 10:25 AM

## 2013-02-07 NOTE — Progress Notes (Signed)
Discussed with PA  See plan below  Phillippa Straub M. Carliyah Cotterman, MD, FACS General, Bariatric, & Minimally Invasive Surgery [facility] \ 

## 2013-02-10 LAB — CULTURE, BLOOD (ROUTINE X 2): Culture: NO GROWTH

## 2013-02-18 ENCOUNTER — Encounter (INDEPENDENT_AMBULATORY_CARE_PROVIDER_SITE_OTHER): Payer: Self-pay | Admitting: Surgery

## 2013-02-18 ENCOUNTER — Ambulatory Visit (INDEPENDENT_AMBULATORY_CARE_PROVIDER_SITE_OTHER): Payer: Medicaid Other | Admitting: Surgery

## 2013-02-18 VITALS — BP 160/78 | HR 86 | Temp 98.7°F | Resp 16 | Ht 65.0 in | Wt 240.8 lb

## 2013-02-18 DIAGNOSIS — Z09 Encounter for follow-up examination after completed treatment for conditions other than malignant neoplasm: Secondary | ICD-10-CM

## 2013-02-18 NOTE — Progress Notes (Signed)
Subjective:     Patient ID: [name], female   DOB: Jan 31, 1961, 52 y.o.   MRN: [id number]  HPI She is here for followup of her right groin wound. She is currently in a nursing facility. She is undergoing daily wet-to-dry dressing changes. She has no complaints  Review of Systems     Objective:   Physical Exam The wound actually looks great and is better than expected. There is excellent granulation tissue and no evidence of infection    Assessment:     Right groin wound status post incision and drainage of abscess     Plan:     We will continue the daily wound care. From a surgical standpoint, this can be done at home with home health. I will see her back in 4 weeks

## 2013-03-11 ENCOUNTER — Telehealth (INDEPENDENT_AMBULATORY_CARE_PROVIDER_SITE_OTHER): Payer: Self-pay

## 2013-03-11 NOTE — Telephone Encounter (Signed)
Chris (home health nurse) calling into office to see if home health orders can be changed to every other day.  Patient not able to do daily wound care.  Home Health nurse states the patients wound appears to be healing well.  They would also like to know if they can continue with using Iodaform gauze for wound packing.  I reviewed the last office note from 02/18/13 and ask if they were doing wet-to-dry dressing changes and [facility]) states they have not been doing wet-to-dry.  Can we extend home health and switch to every other day home visits?

## 2013-03-11 NOTE — Telephone Encounter (Signed)
That is fine 

## 2013-03-12 NOTE — Telephone Encounter (Signed)
Called and left message for Thayer Ohm, RN @ [facility], per Dr. Magnus Ivan home visits for every other day will be fine and they can continue using  Iodaform gauze for wound packing.

## 2013-03-17 ENCOUNTER — Encounter (INDEPENDENT_AMBULATORY_CARE_PROVIDER_SITE_OTHER): Payer: Medicaid Other | Admitting: Surgery

## 2013-03-18 NOTE — Telephone Encounter (Signed)
Pt calling in b/c the packing in the groin wound keeps falling out to where she can't pack it she has to call the home health nurse Thayer Ohm to come to the home to repack the wound. The pt wants to know does she have to repack the wound anymore since it keeps falling out. The pt said the wound is in a hard area to keep packed so she would like to stopping packing it. Pls call pt and the home health nurse Thayer Ohm to give orders.

## 2013-03-18 NOTE — Telephone Encounter (Signed)
Can cover with dry guaze daily and prn and stop the packing

## 2013-03-18 NOTE — Telephone Encounter (Signed)
Called [name] to notify her that Dr Magnus Ivan did advise the [name] can stop packing the wound and cover with a dry gauze as needed. The [name] understands. I notified Thayer Ohm the [facility] nurse of the new instructions.

## 2013-03-19 ENCOUNTER — Telehealth (INDEPENDENT_AMBULATORY_CARE_PROVIDER_SITE_OTHER): Payer: Self-pay | Admitting: General Surgery

## 2013-03-19 NOTE — Telephone Encounter (Signed)
Called and spoke to [employer] back about the wound changes on the patient and they can do every other day dressing changes in the groin.

## 2013-03-19 NOTE — Telephone Encounter (Signed)
Message copied by Wilder Glade on Wed Mar 19, 2013 12:35 PM ------      Message from: Marin Shutter      Created: Wed Mar 19, 2013 12:14 PM      Regarding: Dr. Allie Bossier [place] called re this pt.  On her groin wound - would Dr. B want a dry dressing?  Every other day? How many times a week.      [phone] ------

## 2013-03-26 NOTE — Telephone Encounter (Signed)
Called Ramiro Harvest back and told her to do wet to dry daily dressing changes and she will do so. [phone]

## 2013-03-26 NOTE — Telephone Encounter (Signed)
Spoke with Thayer Ohm regarding this pt and she informed me that the wound specialist nurse suggests that they add calcium agitate  And change the dressing 3x weekly.  She said right now they are only putting a dry 4x4 on it every other day.  I explained that Dr. Magnus Ivan is out of the office today but that I would send this message to his nurse and see if our urgent office Doctor would give approval for this.

## 2013-03-31 ENCOUNTER — Telehealth (INDEPENDENT_AMBULATORY_CARE_PROVIDER_SITE_OTHER): Payer: Self-pay

## 2013-03-31 NOTE — Telephone Encounter (Signed)
Thayer Ohm, RN with [facility] called to report that the patient wound bed appears to be healing well.  Patient has a vaginal yeast infection and patient is taking diflucan.  Patient is non-compliant with dietary recommendations.  Patient A1C and glucose is high.  Patient has follow up appointment with Dr. Magnus Ivan 04/04/13 and will await for wound care instructions at that time.

## 2013-04-04 ENCOUNTER — Telehealth (INDEPENDENT_AMBULATORY_CARE_PROVIDER_SITE_OTHER): Payer: Self-pay | Admitting: General Surgery

## 2013-04-04 ENCOUNTER — Encounter (INDEPENDENT_AMBULATORY_CARE_PROVIDER_SITE_OTHER): Payer: Self-pay | Admitting: Surgery

## 2013-04-04 ENCOUNTER — Ambulatory Visit (INDEPENDENT_AMBULATORY_CARE_PROVIDER_SITE_OTHER): Payer: Medicaid Other | Admitting: Surgery

## 2013-04-04 VITALS — BP 132/82 | HR 64 | Temp 98.6°F | Resp 14 | Ht 65.0 in | Wt 224.2 lb

## 2013-04-04 DIAGNOSIS — Z09 Encounter for follow-up examination after completed treatment for conditions other than malignant neoplasm: Secondary | ICD-10-CM

## 2013-04-04 MED ORDER — FLUCONAZOLE 200 MG PO TABS: 200.0000 mg | ORAL_TABLET | Freq: Every day | ORAL | Status: DC

## 2013-04-04 NOTE — Telephone Encounter (Signed)
Called [facility] Ramiro Harvest [phone]. And gave her orders to do daily wet to dry daily dressing changes and Dr Magnus Ivan gave the patient more refill on her diflucan and it was sent to her out with a Rx

## 2013-04-04 NOTE — Progress Notes (Signed)
Subjective:     Patient ID: [name], female   DOB: May 06, 1961, 52 y.o.   MRN: [id number]  HPI She is here for another wound check. She has a yeast infection and has not improved on Diflucan. She is undergoing wet to dry dressing changes at home health is only doing every other day  Review of Systems     Objective:   Physical Exam The wound actually looks great and continues to contract well. It is approximately 2 and half centimeters in size with excellent granulation tissue. I treated this with silver nitrate    Assessment:     Slowly healing groin wound     Plan:      we will continue the wet-to-dry dressing changes and changes daily.I will give her another dose of Diflucan. I will see her back in one month

## 2013-04-20 ENCOUNTER — Emergency Department (HOSPITAL_COMMUNITY): Payer: Medicaid Other

## 2013-04-20 ENCOUNTER — Encounter (HOSPITAL_COMMUNITY): Payer: Self-pay | Admitting: Physical Medicine and Rehabilitation

## 2013-04-20 ENCOUNTER — Inpatient Hospital Stay (HOSPITAL_COMMUNITY): Payer: Medicaid Other

## 2013-04-20 ENCOUNTER — Inpatient Hospital Stay (HOSPITAL_COMMUNITY)
Admission: EM | Admit: 2013-04-20 | Discharge: 2013-04-22 | DRG: 292 | Disposition: A | Discharge status: Home / Self Care | Payer: Medicaid Other | Attending: Internal Medicine | Admitting: Internal Medicine

## 2013-04-20 DIAGNOSIS — Z91041 Radiographic dye allergy status: Secondary | ICD-10-CM

## 2013-04-20 DIAGNOSIS — L02219 Cutaneous abscess of trunk, unspecified: Secondary | ICD-10-CM | POA: Diagnosis present

## 2013-04-20 DIAGNOSIS — Z880 Allergy status to penicillin: Secondary | ICD-10-CM

## 2013-04-20 DIAGNOSIS — S98139A Complete traumatic amputation of one unspecified lesser toe, initial encounter: Secondary | ICD-10-CM

## 2013-04-20 DIAGNOSIS — I509 Heart failure, unspecified: Secondary | ICD-10-CM | POA: Diagnosis present

## 2013-04-20 DIAGNOSIS — E1165 Type 2 diabetes mellitus with hyperglycemia: Secondary | ICD-10-CM

## 2013-04-20 DIAGNOSIS — R0902 Hypoxemia: Secondary | ICD-10-CM

## 2013-04-20 DIAGNOSIS — F329 Major depressive disorder, single episode, unspecified: Secondary | ICD-10-CM | POA: Diagnosis present

## 2013-04-20 DIAGNOSIS — R0602 Shortness of breath: Secondary | ICD-10-CM | POA: Diagnosis present

## 2013-04-20 DIAGNOSIS — E119 Type 2 diabetes mellitus without complications: Secondary | ICD-10-CM | POA: Diagnosis present

## 2013-04-20 DIAGNOSIS — E1142 Type 2 diabetes mellitus with diabetic polyneuropathy: Secondary | ICD-10-CM | POA: Diagnosis present

## 2013-04-20 DIAGNOSIS — F17200 Nicotine dependence, unspecified, uncomplicated: Secondary | ICD-10-CM | POA: Diagnosis present

## 2013-04-20 DIAGNOSIS — M129 Arthropathy, unspecified: Secondary | ICD-10-CM | POA: Diagnosis present

## 2013-04-20 DIAGNOSIS — I10 Essential (primary) hypertension: Secondary | ICD-10-CM | POA: Diagnosis present

## 2013-04-20 DIAGNOSIS — IMO0002 Reserved for concepts with insufficient information to code with codable children: Secondary | ICD-10-CM

## 2013-04-20 DIAGNOSIS — Z888 Allergy status to other drugs, medicaments and biological substances status: Secondary | ICD-10-CM

## 2013-04-20 DIAGNOSIS — F3289 Other specified depressive episodes: Secondary | ICD-10-CM | POA: Diagnosis present

## 2013-04-20 DIAGNOSIS — I5031 Acute diastolic (congestive) heart failure: Principal | ICD-10-CM | POA: Diagnosis present

## 2013-04-20 DIAGNOSIS — Z794 Long term (current) use of insulin: Secondary | ICD-10-CM

## 2013-04-20 DIAGNOSIS — E1149 Type 2 diabetes mellitus with other diabetic neurological complication: Secondary | ICD-10-CM | POA: Diagnosis present

## 2013-04-20 DIAGNOSIS — G4733 Obstructive sleep apnea (adult) (pediatric): Secondary | ICD-10-CM | POA: Diagnosis present

## 2013-04-20 DIAGNOSIS — E785 Hyperlipidemia, unspecified: Secondary | ICD-10-CM | POA: Diagnosis present

## 2013-04-20 DIAGNOSIS — E039 Hypothyroidism, unspecified: Secondary | ICD-10-CM | POA: Diagnosis present

## 2013-04-20 DIAGNOSIS — S88119A Complete traumatic amputation at level between knee and ankle, unspecified lower leg, initial encounter: Secondary | ICD-10-CM

## 2013-04-20 DIAGNOSIS — Z87442 Personal history of urinary calculi: Secondary | ICD-10-CM

## 2013-04-20 HISTORY — DX: Anxiety disorder, unspecified: F41.9

## 2013-04-20 HISTORY — DX: Type 2 diabetes mellitus without complications: E11.9

## 2013-04-20 HISTORY — DX: Peripheral vascular disease, unspecified: I73.9

## 2013-04-20 HISTORY — DX: Adverse effect of unspecified anesthetic, initial encounter: T41.45XA

## 2013-04-20 HISTORY — DX: Gastro-esophageal reflux disease without esophagitis: K21.9

## 2013-04-20 LAB — POCT I-STAT 3, ART BLOOD GAS (G3+)
Acid-Base Excess: 6 mmol/L — ABNORMAL HIGH (ref 0.0–2.0)
Bicarbonate: 32.6 mEq/L — ABNORMAL HIGH (ref 20.0–24.0)
TCO2: 34 mmol/L (ref 0–100)
pO2, Arterial: 80 mmHg (ref 80.0–100.0)

## 2013-04-20 LAB — CBC WITH DIFFERENTIAL/PLATELET
Basophils Absolute: 0 10*3/uL (ref 0.0–0.1)
Basophils Relative: 0 % (ref 0–1)
Eosinophils Relative: 1 % (ref 0–5)
HCT: 37.4 % (ref 36.0–46.0)
Lymphocytes Relative: 29 % (ref 12–46)
MCHC: 33.4 g/dL (ref 30.0–36.0)
Monocytes Absolute: 0.9 10*3/uL (ref 0.1–1.0)
Neutro Abs: 5.6 10*3/uL (ref 1.7–7.7)
Platelets: 170 10*3/uL (ref 150–400)
RDW: 14.4 % (ref 11.5–15.5)
WBC: 9.3 10*3/uL (ref 4.0–10.5)

## 2013-04-20 LAB — COMPREHENSIVE METABOLIC PANEL
ALT: 32 U/L (ref 0–35)
AST: 29 U/L (ref 0–37)
Albumin: 2.9 g/dL — ABNORMAL LOW (ref 3.5–5.2)
Calcium: 9.8 mg/dL (ref 8.4–10.5)
Chloride: 98 mEq/L (ref 96–112)
Creatinine, Ser: 0.7 mg/dL (ref 0.50–1.10)
Sodium: 138 mEq/L (ref 135–145)

## 2013-04-20 LAB — TROPONIN I: Troponin I: <0.3 ng/mL (ref <0.30)

## 2013-04-20 LAB — GLUCOSE, CAPILLARY: Glucose-Capillary: 152 mg/dL — ABNORMAL HIGH (ref 70–99)

## 2013-04-20 LAB — D-DIMER, QUANTITATIVE: D-Dimer, Quant: 0.38 ug/mL-FEU (ref 0.00–0.48)

## 2013-04-20 LAB — CK: Total CK: 114 U/L (ref 7–177)

## 2013-04-20 MED ORDER — INSULIN DETEMIR 100 UNIT/ML ~~LOC~~ SOLN
70.0000 [IU] | Freq: Every day | SUBCUTANEOUS | Status: DC
  Administered 2013-04-21 – 2013-04-22 (×2): 70 [IU] via SUBCUTANEOUS
  Filled 2013-04-20 (×3): qty 0.7

## 2013-04-20 MED ORDER — ASPIRIN EC 325 MG PO TBEC: 325.0000 mg | DELAYED_RELEASE_TABLET | Freq: Every day | ORAL | Status: DC

## 2013-04-20 MED ORDER — OXYCODONE HCL 5 MG PO TABS: 5.0000 mg | ORAL_TABLET | ORAL | Status: DC | PRN

## 2013-04-20 MED ORDER — ALBUTEROL SULFATE (5 MG/ML) 0.5% IN NEBU: 2.5000 mg | INHALATION_SOLUTION | RESPIRATORY_TRACT | Status: DC | PRN

## 2013-04-20 MED ORDER — FLUOXETINE HCL 20 MG PO CAPS
20.0000 mg | ORAL_CAPSULE | Freq: Two times a day (BID) | ORAL | Status: DC
  Administered 2013-04-20 – 2013-04-22 (×4): 20 mg via ORAL
  Filled 2013-04-20 (×5): qty 1

## 2013-04-20 MED ORDER — HYDROCHLOROTHIAZIDE 12.5 MG PO CAPS
12.5000 mg | ORAL_CAPSULE | Freq: Every day | ORAL | Status: DC
  Administered 2013-04-21 – 2013-04-22 (×2): 12.5 mg via ORAL
  Filled 2013-04-20 (×2): qty 1

## 2013-04-20 MED ORDER — SODIUM CHLORIDE 0.9 % IV SOLN: INTRAVENOUS | Status: DC

## 2013-04-20 MED ORDER — LISINOPRIL 5 MG PO TABS
5.0000 mg | ORAL_TABLET | Freq: Every day | ORAL | Status: DC
  Administered 2013-04-21 – 2013-04-22 (×2): 5 mg via ORAL
  Filled 2013-04-20 (×2): qty 1

## 2013-04-20 MED ORDER — INSULIN ASPART 100 UNIT/ML ~~LOC~~ SOLN
0.0000 [IU] | Freq: Three times a day (TID) | SUBCUTANEOUS | Status: DC
  Administered 2013-04-21: 1 [IU] via SUBCUTANEOUS
  Administered 2013-04-21: 12:00:00 5 [IU] via SUBCUTANEOUS
  Administered 2013-04-21: 17:00:00 3 [IU] via SUBCUTANEOUS
  Administered 2013-04-22: 2 [IU] via SUBCUTANEOUS
  Administered 2013-04-22: 5 [IU] via SUBCUTANEOUS

## 2013-04-20 MED ORDER — OXYBUTYNIN CHLORIDE 5 MG PO TABS
5.0000 mg | ORAL_TABLET | Freq: Every day | ORAL | Status: DC
  Administered 2013-04-21 – 2013-04-22 (×2): 5 mg via ORAL
  Filled 2013-04-20 (×2): qty 1

## 2013-04-20 MED ORDER — TECHNETIUM TC 99M DIETHYLENETRIAME-PENTAACETIC ACID: 40.0000 | Freq: Once | INTRAVENOUS | Status: AC | PRN

## 2013-04-20 MED ORDER — ENOXAPARIN SODIUM 60 MG/0.6ML ~~LOC~~ SOLN
50.0000 mg | SUBCUTANEOUS | Status: DC
  Administered 2013-04-20 – 2013-04-21 (×2): 50 mg via SUBCUTANEOUS
  Filled 2013-04-20 (×3): qty 0.6

## 2013-04-20 MED ORDER — SODIUM CHLORIDE 0.9 % IJ SOLN
3.0000 mL | Freq: Two times a day (BID) | INTRAMUSCULAR | Status: DC
  Administered 2013-04-20 – 2013-04-22 (×4): 3 mL via INTRAVENOUS

## 2013-04-20 MED ORDER — ACETAMINOPHEN 650 MG RE SUPP: 650.0000 mg | Freq: Four times a day (QID) | RECTAL | Status: DC | PRN

## 2013-04-20 MED ORDER — METOCLOPRAMIDE HCL 5 MG PO TABS
5.0000 mg | ORAL_TABLET | Freq: Three times a day (TID) | ORAL | Status: DC
  Administered 2013-04-20 – 2013-04-22 (×5): 5 mg via ORAL
  Filled 2013-04-20 (×7): qty 1

## 2013-04-20 MED ORDER — PANTOPRAZOLE SODIUM 40 MG PO TBEC
40.0000 mg | DELAYED_RELEASE_TABLET | Freq: Every day | ORAL | Status: DC
  Administered 2013-04-21 – 2013-04-22 (×2): 40 mg via ORAL
  Filled 2013-04-20 (×2): qty 1

## 2013-04-20 MED ORDER — ATORVASTATIN CALCIUM 80 MG PO TABS
80.0000 mg | ORAL_TABLET | Freq: Every day | ORAL | Status: DC
  Administered 2013-04-20 – 2013-04-22 (×3): 80 mg via ORAL
  Filled 2013-04-20 (×3): qty 1

## 2013-04-20 MED ORDER — INSULIN DETEMIR 100 UNIT/ML ~~LOC~~ SOLN
70.0000 [IU] | Freq: Every day | SUBCUTANEOUS | Status: DC
  Administered 2013-04-21 – 2013-04-22 (×2): 70 [IU] via SUBCUTANEOUS
  Filled 2013-04-20 (×3): qty 0.7

## 2013-04-20 MED ORDER — ASPIRIN EC 81 MG PO TBEC
81.0000 mg | DELAYED_RELEASE_TABLET | Freq: Every day | ORAL | Status: DC
  Administered 2013-04-21 – 2013-04-22 (×2): 81 mg via ORAL
  Filled 2013-04-20 (×2): qty 1

## 2013-04-20 MED ORDER — SODIUM CHLORIDE 0.9 % IJ SOLN
3.0000 mL | Freq: Two times a day (BID) | INTRAMUSCULAR | Status: DC
  Administered 2013-04-20 – 2013-04-22 (×3): 3 mL via INTRAVENOUS

## 2013-04-20 MED ORDER — TECHNETIUM TO 99M ALBUMIN AGGREGATED
6.0000 | Freq: Once | INTRAVENOUS | Status: AC | PRN
  Administered 2013-04-20: 22:00:00 6 via INTRAVENOUS

## 2013-04-20 MED ORDER — INSULIN DETEMIR 100 UNIT/ML ~~LOC~~ SOLN: 140.0000 [IU] | Freq: Every morning | SUBCUTANEOUS | Status: DC

## 2013-04-20 MED ORDER — ONDANSETRON HCL 4 MG PO TABS: 4.0000 mg | ORAL_TABLET | Freq: Four times a day (QID) | ORAL | Status: DC | PRN

## 2013-04-20 MED ORDER — LEVOTHYROXINE SODIUM 25 MCG PO TABS
25.0000 ug | ORAL_TABLET | Freq: Every day | ORAL | Status: DC
  Administered 2013-04-21 – 2013-04-22 (×2): 25 ug via ORAL
  Filled 2013-04-20 (×3): qty 1

## 2013-04-20 MED ORDER — OXYCODONE-ACETAMINOPHEN 10-325 MG PO TABS: 1.0000 | ORAL_TABLET | ORAL | Status: DC | PRN

## 2013-04-20 MED ORDER — ONDANSETRON HCL 4 MG/2ML IJ SOLN: 4.0000 mg | Freq: Four times a day (QID) | INTRAMUSCULAR | Status: DC | PRN

## 2013-04-20 MED ORDER — OXYCODONE-ACETAMINOPHEN 5-325 MG PO TABS: 1.0000 | ORAL_TABLET | ORAL | Status: DC | PRN

## 2013-04-20 MED ORDER — ACETAMINOPHEN 325 MG PO TABS
650.0000 mg | ORAL_TABLET | Freq: Four times a day (QID) | ORAL | Status: DC | PRN
  Administered 2013-04-21: 650 mg via ORAL
  Filled 2013-04-20: qty 2

## 2013-04-20 MED ORDER — GABAPENTIN 400 MG PO CAPS
800.0000 mg | ORAL_CAPSULE | Freq: Three times a day (TID) | ORAL | Status: DC
  Administered 2013-04-20 – 2013-04-22 (×5): 800 mg via ORAL
  Filled 2013-04-20 (×7): qty 2

## 2013-04-20 MED ORDER — GABAPENTIN 800 MG PO TABS
800.0000 mg | ORAL_TABLET | Freq: Three times a day (TID) | ORAL | Status: DC
  Filled 2013-04-20: qty 1

## 2013-04-20 MED ORDER — ENOXAPARIN SODIUM 40 MG/0.4ML ~~LOC~~ SOLN: 40.0000 mg | SUBCUTANEOUS | Status: DC

## 2013-04-20 NOTE — ED Provider Notes (Signed)
CSN: [id number]     Arrival date & time 04/20/13  1419 History   First MD Initiated Contact with Patient 04/20/13 1445     Chief Complaint  Patient presents with  . Shortness of Breath   (Consider location/radiation/quality/duration/timing/severity/associated sxs/prior Treatment) HPI Comments: Patient presents for shortness of breath and hypoxia that onset this morning around 3 AM. She woke up short of breath as been difficulty breathing and fatigue throughout the past day. She reports a history of sleep apnea, CHF, diabetes. Recent hospitalization for groin abscess. She reports her groin abscess is healing well. Denies any fevers, nausea or vomiting. She denies any cough or chest pain. She denies any history of asthma or COPD. She denies any increase in her lower extremity edema.  The history is provided by the patient.    Past Medical History  Diagnosis Date  . Diabetic neuropathy   . Cellulitis   . Thyroid disease   . Hyperlipidemia     not on meds  . Heart murmur   . Arthritis   . Neuromuscular disorder   . Hypertension     hx of;but doesn't require meds anymore;has been off of x 51yr  . Pneumonia     hx of;>2yr ago   . Concussion as a teenager    slight  . Diarrhea     pt takes Reglan tid  . History of kidney stones   . Hypothyroidism     takes Synthroid daily  . Diabetes mellitus     Novolog,Levemir,and Lovaza daily  . Depression     takes CYmbalta daily  . Peripheral edema     takes Lasix daily  . MRSA (methicillin resistant staph aureus) culture positive     hx of 2012  . Staph infection 2007   Past Surgical History  Procedure Laterality Date  . Amputation    . Leg amputation      right leg amputation below knee  . Toe amputation      left foot only 2 toes remaning.   . Tubal ligation    . Foot surgery      right foot  . Wrist surgery      right tendon  . Dilation and curettage of uterus    . Refractive surgery      left  . Incision and drainage  abscess N/A 01/24/2013    Procedure: INCISION AND DRAINAGE ABSCESS;  Surgeon: Clovis Pu. Cornett, MD;  Location: [facility] OR;  Service: General;  Laterality: N/A;  . Inguinal hernia repair N/A 01/28/2013    Procedure: I&D Right Groin Wound;  Surgeon: Shelly Rubenstein, MD;  Location: [facility] OR;  Service: General;  Laterality: N/A;   Family History  Problem Relation Age of Onset  . Hypertension Mother   . Hyperlipidemia Mother   . Hypertension Brother   . Hyperlipidemia Brother   . Cancer Maternal Aunt   . Diabetes Paternal Aunt   . Diabetes Paternal Uncle   . Diabetes Paternal Grandmother   . Anesthesia problems Neg Hx   . Hypotension Neg Hx   . Malignant hyperthermia Neg Hx   . Pseudochol deficiency Neg Hx    History  Substance Use Topics  . Smoking status: Current Every Day Smoker -- 1.00 packs/day for 31 years    Types: Cigarettes  . Smokeless tobacco: Never Used  . Alcohol Use: No   OB History   Grav Para Term Preterm Abortions TAB SAB Ect Mult Living  Review of Systems  Constitutional: Positive for activity change, appetite change and fatigue. Negative for fever.  HENT: Negative for congestion and rhinorrhea.   Respiratory: Positive for cough, chest tightness and shortness of breath.   Cardiovascular: Negative for chest pain.  Gastrointestinal: Negative for nausea, vomiting and abdominal pain.  Genitourinary: Negative for dysuria, vaginal bleeding and vaginal discharge.  Musculoskeletal: Negative for back pain.  Skin: Negative for rash.  Neurological: Positive for weakness. Negative for dizziness and headaches.  A complete 10 system review of systems was obtained and all systems are negative except as noted in the HPI and PMH.    Allergies  Gadolinium; Ivp dye; Metformin; and Penicillins  Home Medications   No current outpatient prescriptions on file. BP 129/61  Pulse 61  Temp(Src) 97.8 F (36.6 C) (Oral)  Resp 18  Ht 5\' 5"  (1.651 m)  Wt 225 lb 1.6 oz  (102.105 kg)  BMI 37.46 kg/m2  SpO2 94% Physical Exam  Constitutional: She is oriented to person, place, and time. She appears well-developed and well-nourished. No distress.  HENT:  Head: Normocephalic.  Mouth/Throat: Oropharynx is clear and moist. No oropharyngeal exudate.  Eyes: Conjunctivae and EOM are normal. Pupils are equal, round, and reactive to light.  Neck: Normal range of motion. Neck supple.  Cardiovascular: Normal rate, regular rhythm and normal heart sounds.   No murmur heard. Pulmonary/Chest: Effort normal and breath sounds normal. No respiratory distress. She has no wheezes.  Abdominal: Soft. There is no tenderness. There is no rebound and no guarding.  Musculoskeletal: Normal range of motion. She exhibits no edema and no tenderness.  R BKA  Neurological: She is alert and oriented to person, place, and time. No cranial nerve deficit. She exhibits normal muscle tone. Coordination normal.  Skin: Skin is warm.    ED Course  Procedures (including critical care time) Labs Review Labs Reviewed  COMPREHENSIVE METABOLIC PANEL - Abnormal; Notable for the following:    Glucose, Bld 155 (*)    Albumin 2.9 (*)    Alkaline Phosphatase 123 (*)    All other components within normal limits  PRO B NATRIURETIC PEPTIDE - Abnormal; Notable for the following:    Pro B Natriuretic peptide (BNP) 546.0 (*)    All other components within normal limits  POCT I-STAT 3, BLOOD GAS (G3+) - Abnormal; Notable for the following:    pCO2 arterial 55.1 (*)    Bicarbonate 32.6 (*)    Acid-Base Excess 6.0 (*)    All other components within normal limits  CBC WITH DIFFERENTIAL  TROPONIN I  D-DIMER, QUANTITATIVE  CK  TROPONIN I  TROPONIN I  TROPONIN I  COMPREHENSIVE METABOLIC PANEL  CBC WITH DIFFERENTIAL  TSH  CBC  GLUCOSE, CAPILLARY  CG4 I-STAT (LACTIC ACID)   Imaging Review Dg Chest 2 View  04/20/2013   *RADIOLOGY REPORT*  Clinical Data: Shortness of breath and chest pain.  CHEST - 2  VIEW  Comparison: PA and lateral chest 12/11/2011.  Findings: Lungs are clear.  Heart size is upper normal.  No pneumothorax or pleural fluid.  No focal bony abnormality.  IMPRESSION: No acute disease.   Original Report Authenticated By: Holley Dexter, M.D.    MDM   1. Hypoxia   2. Shortness of breath   3. Diabetes mellitus   4. HTN (hypertension)   5. Hypothyroidism   6. SOB (shortness of breath)    Shortness of breath and hypoxia since 3am.  No chest pain. Increased fatigue.  No distress, lungs clear.  No wheezing. Hx CHF, OSA. No evidence of volume overload on exam. CXR negative, EKG nsr. Mild CO2 retention on ABG appears stable.  Dyspneic with exertion and hypoxic to 80s with ambulation. Consider PE given recent hospitalization and surgery. VQ scan ordered as patient is allergic to contrast. Admission d./w Dr. Toniann Fail.   Date: 04/20/2013  Rate: 73  Rhythm: normal sinus rhythm  QRS Axis: normal  Intervals: normal  ST/T Wave abnormalities: normal  Conduction Disutrbances:left anterior fascicular block  Narrative Interpretation:   Old EKG Reviewed: unchanged    Glynn Octave, MD 04/20/13 2147

## 2013-04-20 NOTE — H&P (Signed)
[facility] History and Physical  CAMBER NINH FAO:[id number] DOB: 1960/11/05 DOA: 04/20/2013  Referring physician: ER physician. PCP: [name], MD   Chief Complaint: Shortness of breath and chest discomfort.  HPI: [name] is a 52 y.o. female with history of hypertension, diabetes mellitus type 2 who has had a right groin abscess and was admitted at [facility] presently he is getting dressing change at home with home health care was found to be hypoxic and referred to the ER. Patient states that she had sudden onset of shortness of breath with chest discomfort which woke her up from sleep at 4 AM. Episode lasted for around half an hour and resolved by itself. A home health care were found very hypoxic and was referred to the ER. In the ER patient was getting easily hypoxic with exertion. Patient was admitted for further management. Chest x-ray did not show anything acute. BNP is mildly elevated. VQ scan is pending. On my exam patient presently is asymptomatic and not in acute distress. Patient will be admitted for further management. Patient denies any fever chills productive cough. Presently patient does not have any chest pain.  Review of Systems: As presented in the history of presenting illness, rest negative.  Past Medical History  Diagnosis Date  . Diabetic neuropathy   . Cellulitis   . Thyroid disease   . Hyperlipidemia     not on meds  . Heart murmur   . Arthritis   . Neuromuscular disorder   . Hypertension     hx of;but doesn't require meds anymore;has been off of x 23yr  . Pneumonia     hx of;>31yr ago   . Concussion as a teenager    slight  . Diarrhea     pt takes Reglan tid  . History of kidney stones   . Hypothyroidism     takes Synthroid daily  . Diabetes mellitus     Novolog,Levemir,and Lovaza daily  . Depression     takes CYmbalta daily  . Peripheral edema     takes Lasix daily  . MRSA (methicillin resistant staph aureus) culture  positive     hx of 2012  . Staph infection 2007   Past Surgical History  Procedure Laterality Date  . Amputation    . Leg amputation      right leg amputation below knee  . Toe amputation      left foot only 2 toes remaning.   . Tubal ligation    . Foot surgery      right foot  . Wrist surgery      right tendon  . Dilation and curettage of uterus    . Refractive surgery      left  . Incision and drainage abscess N/A 01/24/2013    Procedure: INCISION AND DRAINAGE ABSCESS;  Surgeon: Clovis Pu. Cornett, MD;  Location: [facility] OR;  Service: General;  Laterality: N/A;  . Inguinal hernia repair N/A 01/28/2013    Procedure: I&D Right Groin Wound;  Surgeon: Shelly Rubenstein, MD;  Location: [facility] OR;  Service: General;  Laterality: N/A;   Social History:  reports that she has been smoking Cigarettes.  She has a 31 pack-year smoking history. She has never used smokeless tobacco. She reports that she does not drink alcohol or use illicit drugs. Home. where does patient live-- Can do ADLs. Can patient participate in ADLs?  Allergies  Allergen Reactions  . Gadolinium      Code: VOM,  Desc: Pt began vomiting immed post infusion of multihance, Onset Date: [id number]   . Ivp Dye [Iodinated Diagnostic Agents] Nausea And Vomiting  . Metformin Other (See Comments)     diarrhea  . Penicillins Hives    Family History  Problem Relation Age of Onset  . Hypertension Mother   . Hyperlipidemia Mother   . Hypertension Brother   . Hyperlipidemia Brother   . Cancer Maternal Aunt   . Diabetes Paternal Aunt   . Diabetes Paternal Uncle   . Diabetes Paternal Grandmother   . Anesthesia problems Neg Hx   . Hypotension Neg Hx   . Malignant hyperthermia Neg Hx   . Pseudochol deficiency Neg Hx       Prior to Admission medications   Medication Sig Start Date End Date Taking? Authorizing Provider  aspirin EC 81 MG tablet Take 81 mg by mouth daily.   Yes Historical Provider, MD  atorvastatin (LIPITOR) 80 MG  tablet Take 80 mg by mouth daily.   Yes Historical Provider, MD  FLUoxetine (PROZAC) 20 MG tablet Take 20 mg by mouth 2 (two) times daily.    Yes Historical Provider, MD  gabapentin (NEURONTIN) 800 MG tablet Take 800 mg by mouth 3 (three) times daily.    Yes Historical Provider, MD  hydrochlorothiazide (MICROZIDE) 12.5 MG capsule Take 12.5 mg by mouth daily.   Yes Historical Provider, MD  insulin aspart (NOVOLOG) 100 UNIT/ML injection Inject 36-48 Units into the skin 3 (three) times daily with meals. *per sliding scale   Yes Historical Provider, MD  insulin detemir (LEVEMIR) 100 UNIT/ML injection Inject 140 Units into the skin every morning. *uses 70 units on each side   Yes Historical Provider, MD  levothyroxine (SYNTHROID, LEVOTHROID) 25 MCG tablet Take 25 mcg by mouth every morning.    Yes Historical Provider, MD  lisinopril (PRINIVIL,ZESTRIL) 5 MG tablet Take 5 mg by mouth daily.   Yes Historical Provider, MD  metoCLOPramide (REGLAN) 5 MG tablet Take 5 mg by mouth 3 (three) times daily.    Yes Historical Provider, MD  omeprazole (PRILOSEC) 20 MG capsule Take 20 mg by mouth daily.   Yes Historical Provider, MD  oxybutynin (DITROPAN) 5 MG tablet Take 5 mg by mouth every morning.     Yes Historical Provider, MD  oxyCODONE-acetaminophen (PERCOCET) 10-325 MG per tablet Take 1 tablet by mouth every 4 (four) hours as needed for pain.   Yes Historical Provider, MD   Physical Exam: Filed Vitals:   04/20/13 1845 04/20/13 1930 04/20/13 1945 04/20/13 2000  BP: 150/62 137/72  148/68  Pulse: 65 62 64 65  Temp:      TempSrc:      Resp: 20 13 16 20   SpO2: 96% 93% 98% 96%     General:  Well-developed well-nourished.  Eyes: Anicteric no pallor.  ENT: No discharge from the ears eyes nose mouth.  Neck: No mass felt.  Cardiovascular: S1-S2 heard.  Respiratory: No rhonchi or crepitations.  Abdomen: Soft nontender. Bowel sounds present.  Skin: No rash.  Musculoskeletal: Right leg  amputation.  Psychiatric: Appears normal.  Neurologic: Alert awake and oriented to time place and person. Moves all extremities.  Labs on Admission:  Basic Metabolic Panel:  Recent Labs Lab 04/20/13 1510  NA 138  K 4.3  CL 98  CO2 31  GLUCOSE 155*  BUN 19  CREATININE 0.70  CALCIUM 9.8   Liver Function Tests:  Recent Labs Lab 04/20/13 1510  AST 29  ALT 32  ALKPHOS 123*  BILITOT 0.3  PROT 7.4  ALBUMIN 2.9*   No results found for this basename: LIPASE, AMYLASE,  in the last 168 hours No results found for this basename: AMMONIA,  in the last 168 hours CBC:  Recent Labs Lab 04/20/13 1510  WBC 9.3  NEUTROABS 5.6  HGB 12.5  HCT 37.4  MCV 83.1  PLT 170   Cardiac Enzymes:  Recent Labs Lab 04/20/13 1510  TROPONINI <0.30    BNP (last 3 results)  Recent Labs  04/20/13 1510  PROBNP 546.0*   CBG: No results found for this basename: GLUCAP,  in the last 168 hours  Radiological Exams on Admission: Dg Chest 2 View  04/20/2013   *RADIOLOGY REPORT*  Clinical Data: Shortness of breath and chest pain.  CHEST - 2 VIEW  Comparison: PA and lateral chest 12/11/2011.  Findings: Lungs are clear.  Heart size is upper normal.  No pneumothorax or pleural fluid.  No focal bony abnormality.  IMPRESSION: No acute disease.   Original Report Authenticated By: Holley Dexter, M.D.    EKG: Independently reviewed. Normal sinus rhythm.  Assessment/Plan Principal Problem:   SOB (shortness of breath) Active Problems:   HTN (hypertension)   Hypothyroidism   Diabetes mellitus   1. Shortness of breath the chest discomfort - presently asymptomatic. Cause not clear. Could be decompensated OSA/CHF. VQ scan is pending. Cycle cardiac markers. Check 2-D echo. Check drug screen. 2. Right groin abscess - patient states it has markedly improved. Wound consult. 3. Diabetes mellitus type 2 - closely follow CBGs. 4. Hypothyroidism - continue Synthroid. Check TSH. 5. Hypertension -  continue present medications.    Code Status: Full code.  Family Communication: None.  Disposition Plan: Admit to inpatient.    KAKRAKANDY,ARSHAD N. [facility] Pager [phone].  If 7PM-7AM, please contact night-coverage [email or web address] Password [facility] 04/20/2013, 8:39 PM

## 2013-04-20 NOTE — ED Notes (Signed)
Patient transported to X-ray 

## 2013-04-20 NOTE — ED Notes (Signed)
Ambulated out of room and about 20 feet down the hall. Saturations 87%, patient became diaphoretic, pale and stated she felt weak. She stated she did not feel short of breath and respirations did not become labored. Got her back into bed and sats 86% on room air. Placed back on [place] 2L; quickly recovered sats to 92% and more gradually recovered to 97%. Notified Dr. Manus Gunning of ambulation results.

## 2013-04-20 NOTE — ED Notes (Signed)
Pt presents to department for evaluation of SOB. Onset this morning. Also states low oxygen saturation at home per home health aid was 72%RA. Respirations unlabored. States she feels very tired and weak, sleeping more than normal. Denies chest pain. She is alert and oriented x4.

## 2013-04-20 NOTE — ED Notes (Signed)
Hooked pt back up to the monitor.

## 2013-04-20 NOTE — ED Notes (Addendum)
Patient returned from X-ray 

## 2013-04-21 ENCOUNTER — Encounter (HOSPITAL_COMMUNITY): Payer: Self-pay | Admitting: General Practice

## 2013-04-21 DIAGNOSIS — R0602 Shortness of breath: Secondary | ICD-10-CM

## 2013-04-21 DIAGNOSIS — I5031 Acute diastolic (congestive) heart failure: Secondary | ICD-10-CM

## 2013-04-21 LAB — RAPID URINE DRUG SCREEN, HOSP PERFORMED
Barbiturates: NOT DETECTED
Cocaine: NOT DETECTED
Tetrahydrocannabinol: NOT DETECTED

## 2013-04-21 LAB — GLUCOSE, CAPILLARY
Glucose-Capillary: 128 mg/dL — ABNORMAL HIGH (ref 70–99)
Glucose-Capillary: 207 mg/dL — ABNORMAL HIGH (ref 70–99)

## 2013-04-21 LAB — CBC WITH DIFFERENTIAL/PLATELET
HCT: 35.2 % — ABNORMAL LOW (ref 36.0–46.0)
Hemoglobin: 12 g/dL (ref 12.0–15.0)
Lymphocytes Relative: 36 % (ref 12–46)
Monocytes Absolute: 0.8 10*3/uL (ref 0.1–1.0)
Monocytes Relative: 10 % (ref 3–12)
Neutro Abs: 4.5 10*3/uL (ref 1.7–7.7)
WBC: 8.3 10*3/uL (ref 4.0–10.5)

## 2013-04-21 LAB — TSH: TSH: 1.939 u[IU]/mL (ref 0.350–4.500)

## 2013-04-21 LAB — COMPREHENSIVE METABOLIC PANEL
BUN: 20 mg/dL (ref 6–23)
Calcium: 9.1 mg/dL (ref 8.4–10.5)
GFR calc Af Amer: >90 mL/min (ref >90)
Glucose, Bld: 129 mg/dL — ABNORMAL HIGH (ref 70–99)
Sodium: 140 mEq/L (ref 135–145)
Total Protein: 6.4 g/dL (ref 6.0–8.3)

## 2013-04-21 MED ORDER — FUROSEMIDE 40 MG PO TABS
40.0000 mg | ORAL_TABLET | Freq: Every day | ORAL | Status: DC
  Administered 2013-04-21 – 2013-04-22 (×2): 40 mg via ORAL
  Filled 2013-04-21 (×2): qty 1

## 2013-04-21 NOTE — Progress Notes (Signed)
[facility] PROGRESS NOTE  [name] ZOX:[id number] DOB: 1961-04-06 DOA: 04/20/2013 PCP: [name], MD   brief narrative 52 y.o. female with history of hypertension, diabetes mellitus type 2 , hx of left BKA, who has had a right groin abscess and was admitted at [place] presently he is getting dressing change at home with home health care was found to be hypoxic and referred to the ER. Patient states that she had sudden onset of shortness of breath with chest discomfort which woke her up from sleep at 4 AM. Episode lasted for around half an hour and resolved by itself. A home health care were found very hypoxic and was referred to the ER. In the ER patient was getting easily hypoxic with exertion. CXR was negative, ABG showed CO2 of 55.  Pro BNP was mildly elevated. Patient was admitted for further management.    Assessment/Plan: Shortness of breath Possibly CHF exacerbation ( diastolic dysfn ) vs OSA/ OHS. No signs of infection. VQ low probability for PE. Patient however desats to 70s on RA and stable on 2L via [place]. 2D echo pending. Has trace leg edema. Will start her on po lasix.   Right groin abscess  - patient states it has markedly improved. No discharge or erythema on exam. Wound consult evaluated.   Diabetes mellitus type 2 Monitor fsg  Hypothyroidism   continue Synthroid. Check TSH.   Hypertension  - continue home medications.  Code Status: full Family Communication: none at bedside Disposition Plan: home once stable   Consultants:  none  Procedures:  none  Antibiotics:  none  HPI/Subjective: Patient reports some SOB on exertion.   Objective: Filed Vitals:   04/21/13 1424  BP: 110/51  Pulse: 61  Temp: 97.7 F (36.5 C)  Resp: 19    Intake/Output Summary (Last 24 hours) at 04/21/13 1601 Last data filed at 04/21/13 1530  Gross per 24 hour  Intake   1000 ml  Output   1201 ml  Net   -201 ml   Filed Weights   04/20/13 2052  04/21/13 0632  Weight: 102.105 kg (225 lb 1.6 oz) 102.105 kg (225 lb 1.6 oz)    Exam:   General:  Middle aged female in NAD  HEENT: no pallor, moist oral mucosa  Chest: clear b/l, no added sounds  CVS: NS1&S2, no murmurs  Abd: soft, NT, ND, BS+  Ext: warm, trace edema over left, rt BKA , small ulcer over rt groin clean  CNS; AAOX3    Data Reviewed: Basic Metabolic Panel:  Recent Labs Lab 04/20/13 1510 04/21/13 0445  NA 138 140  K 4.3 4.0  CL 98 100  CO2 31 29  GLUCOSE 155* 129*  BUN 19 20  CREATININE 0.70 0.78  CALCIUM 9.8 9.1   Liver Function Tests:  Recent Labs Lab 04/20/13 1510 04/21/13 0445  AST 29 30  ALT 32 24  ALKPHOS 123* 97  BILITOT 0.3 0.3  PROT 7.4 6.4  ALBUMIN 2.9* 2.4*   No results found for this basename: LIPASE, AMYLASE,  in the last 168 hours No results found for this basename: AMMONIA,  in the last 168 hours CBC:  Recent Labs Lab 04/20/13 1510 04/21/13 0445  WBC 9.3 8.3  NEUTROABS 5.6 4.5  HGB 12.5 12.0  HCT 37.4 35.2*  MCV 83.1 84.0  PLT 170 163   Cardiac Enzymes:  Recent Labs Lab 04/20/13 1510 04/20/13 2235 04/21/13 0445 04/21/13 0900  CKTOTAL  --  114  --   --  TROPONINI <0.30 <0.30 <0.30 <0.30   BNP (last 3 results)  Recent Labs  04/20/13 1510  PROBNP 546.0*   CBG:  Recent Labs Lab 04/20/13 2113 04/21/13 0547 04/21/13 1137  GLUCAP 152* 128* 258*    No results found for this or any previous visit (from the past 240 hour(s)).   Studies: Dg Chest 2 View  04/20/2013   *RADIOLOGY REPORT*  Clinical Data: Shortness of breath and chest pain.  CHEST - 2 VIEW  Comparison: PA and lateral chest 12/11/2011.  Findings: Lungs are clear.  Heart size is upper normal.  No pneumothorax or pleural fluid.  No focal bony abnormality.  IMPRESSION: No acute disease.   Original Report Authenticated By: Holley Dexter, M.D.   Nm Pulmonary Perf And Vent  04/20/2013   *RADIOLOGY REPORT*  Clinical Data: Hypoxia,  shortness of breath, contrast allergy  NM PULMONARY VENTILATION AND PERFUSION SCAN  Radiopharmaceutical: CURIE MAA TECHNETIUM TO 18M ALBUMIN AGGREGATED  Comparison: Nuclear medicine lung scan 01/31/2013  Findings:  Ventilation:  No focal defects are identified.  Perfusion:  No wedge-shaped perfusion defects are seen in the periphery of the lung to suggest acute pulmonary embolism.  IMPRESSION: Very low probability for pulmonary embolism.   Original Report Authenticated By: Britta Mccreedy, M.D.    Scheduled Meds: . aspirin EC  81 mg Oral Daily  . atorvastatin  80 mg Oral Daily  . enoxaparin (LOVENOX) injection  50 mg Subcutaneous Q24H  . FLUoxetine  20 mg Oral BID  . furosemide  40 mg Oral Daily  . gabapentin  800 mg Oral TID  . hydrochlorothiazide  12.5 mg Oral Daily  . insulin aspart  0-9 Units Subcutaneous TID WC  . insulin detemir  70 Units Subcutaneous Daily   And  . insulin detemir  70 Units Subcutaneous Daily  . levothyroxine  25 mcg Oral QAC breakfast  . lisinopril  5 mg Oral Daily  . metoCLOPramide  5 mg Oral TID  . oxybutynin  5 mg Oral Daily  . pantoprazole  40 mg Oral Daily  . sodium chloride  3 mL Intravenous Q12H  . sodium chloride  3 mL Intravenous Q12H   Continuous Infusions:     Time spent: 25 MINUTES    Wave Calzada  [facility] Pager [phone]. If 7PM-7AM, please contact night-coverage at [email or web address], password [facility] 04/21/2013, 4:01 PM  LOS: 1 day

## 2013-04-21 NOTE — Consult Note (Signed)
WOC consult Note Reason for Consult: Right groin wound (Chronic) Wound type:Surgical  (Dr. Magnus Ivan following as an outpatient. Last visit with Dr. Magnus Ivan was on 04/03/13) Pressure Ulcer POA: No Measurement: 1cm x 0.4cm x 0.2cm Wound ZOX:WRUEA, pink, granulating with periwound maceration to 0.5cm peripherally Drainage (amount, consistency, odor) scant amount of serous drainage on old dressing. Periwound: As above Dressing procedure/placement/frequency: I will continue the conservative but effective POC implemented by Dr. Magnus Ivan i.e., a saline moistened dressing with daily changes.  Also noted is that patient is wearing an adult containment garment for SUI (stress urinary incontinence).  Patient has been treated twice in the past 30 days for fungal overgrowth and it is recommended that the use of briefs be suspended while patient is an inpatient.  Patient education regarding the etiology of fungal overgrowth in the presence of moisture provided.  Patient indicated understanding.  Orders provided for the discontinuance of adult containment garments in acute care.  Additionally, patient is lying upon a DermaTherapy underpad (reusable), which can easily be changed when it becomes wet.  This antimicrobial linen will support skin health while in house. WOC Nursing Team will not follow, but will remain available as needed to this patient, her medical and nursing teams.  Please re-consult if needed. Thanks, Ladona Mow, MSN, RN, GNP, [place], CWON-AP [phone])

## 2013-04-21 NOTE — Progress Notes (Signed)
Dr. Gonzella Lex asked to assess patient's oxygen saturations on room air and to notify him of results. Patient desated to 84% on room air at rest. Patient was placed back on 2L O2 via nasal cannula and came up to 94%. Dr. Gonzella Lex made aware.

## 2013-04-21 NOTE — Progress Notes (Signed)
Echo Lab  2D Echocardiogram completed.  Teara Duerksen L Leif Loflin, RDCS 04/21/2013 11:07 AM

## 2013-04-22 DIAGNOSIS — E1165 Type 2 diabetes mellitus with hyperglycemia: Secondary | ICD-10-CM

## 2013-04-22 DIAGNOSIS — I5031 Acute diastolic (congestive) heart failure: Principal | ICD-10-CM | POA: Diagnosis present

## 2013-04-22 DIAGNOSIS — R0902 Hypoxemia: Secondary | ICD-10-CM

## 2013-04-22 DIAGNOSIS — F17200 Nicotine dependence, unspecified, uncomplicated: Secondary | ICD-10-CM

## 2013-04-22 LAB — GLUCOSE, CAPILLARY: Glucose-Capillary: 255 mg/dL — ABNORMAL HIGH (ref 70–99)

## 2013-04-22 MED ORDER — FUROSEMIDE 40 MG PO TABS: 40.0000 mg | ORAL_TABLET | Freq: Every day | ORAL | Status: DC

## 2013-04-22 MED ORDER — ALBUTEROL SULFATE HFA 108 (90 BASE) MCG/ACT IN AERS: 2.0000 | INHALATION_SPRAY | Freq: Four times a day (QID) | RESPIRATORY_TRACT | Status: DC | PRN

## 2013-04-22 NOTE — Discharge Summary (Signed)
Physician Discharge Summary  SPARROW SIRACUSA NWG:[id number] DOB: November 03, 1960 DOA: 04/20/2013  PCP: [name], MD  Admit date: 04/20/2013 Discharge date: 04/22/2013  Time spent: 40 minutes  Recommendations for Outpatient Follow-up:  Home with continuous home o2  ( 2L via [place]) Follow up with PCP in 1 week. Needs referral for PFTs and sleep study as outpatient.  Discharge Diagnoses:  Principal Problem:   Acute diastolic heart failure  Active Problems:   TOBACCO ABUSE   hypoxia   HTN (hypertension)   Hypothyroidism   SOB (shortness of breath)   Diabetes mellitus   Discharge Condition: fair  Diet recommendation: diabetic  Filed Weights   04/20/13 2052 04/21/13 5784 04/22/13 0535  Weight: 102.105 kg (225 lb 1.6 oz) 102.105 kg (225 lb 1.6 oz) 99.292 kg (218 lb 14.4 oz)    History of present illness:  Please refer to admission H&P for details but in brief, 52 y.o. female with history of hypertension, diabetes mellitus type 2 , hx of left BKA, who has had a right groin abscess and was admitted at [facility] presently he is getting dressing change at home with home health care was found to be hypoxic and referred to the ER. Patient states that she had sudden onset of shortness of breath with chest discomfort which woke her up from sleep at 4 AM. Episode lasted for around half an hour and resolved by itself. A home health care were found very hypoxic and was referred to the ER. In the ER patient was getting easily hypoxic with exertion. CXR was negative, ABG showed CO2 of 55. Pro BNP was mildly elevated. Patient was admitted for further management.    Hospital Course:  Shortness of breath  Possibly CHF exacerbation ( grade 2 diastolic dysfn on 2D echo ) . Likely has underlying  OSA/ OHS vs early COPD  . No signs of infection. VQ scan done showed low probability for PE. Patient however desats to 70s on RA and stable on 2L via [place]. Started on oral lasix with good urine output. i will  discharge her on po lasix daily. also added albuterol inhaler. She doesn't appear volume overloaded on exam today. She still desaturates to mid 80s on RA and sats improve to >95 on 2L via [place] upon discharge. She continues to smoke almost a PPD and i have instructed her clearly that she or her boyfriend CANNOT smoke while she has the oxygen on. Patient wishes to quit smoking but says she cannot afford the nicotine patch and will call 1-800 numbers to see if she can get free samples.    Right groin abscess  - patient states it has markedly improved. No discharge or erythema on exam. Wound consult evaluated.   Diabetes mellitus type 2  Uncontrolled and on high dose insulin  fsg stable Hypothyroidism  continue Synthroid. TSH normal .   Hypertension  - continue home medications.   Code Status: full  Family Communication: brother at bedside  Disposition Plan: home with outpt PCP follow up    Consultants:  none Procedures:  none Antibiotics:  none   Discharge Exam: Filed Vitals:   04/22/13 1051  BP: 132/70  Pulse:   Temp:   Resp:    General: Middle aged female in NAD  HEENT: no pallor, moist oral mucosa  Chest: clear b/l, no added sounds  CVS: NS1&S2, no murmurs  Abd: soft, NT, ND, BS+  Ext: warm, trace edema over left, rt BKA , small ulcer over  rt groin clean CNS; AAOX3   Discharge Instructions   Future Appointments Provider Department Dept Phone   05/05/2013 3:10 PM Shelly Rubenstein, MD [facility], [place] [phone]       Medication List    TAKE these medications       albuterol 108 (90 BASE) MCG/ACT inhaler  Commonly known as:  PROVENTIL HFA;VENTOLIN HFA  Inhale 2 puffs into the lungs every 6 (six) hours as needed for shortness of breath.     aspirin EC 81 MG tablet  Take 81 mg by mouth daily.     atorvastatin 80 MG tablet  Commonly known as:  LIPITOR  Take 80 mg by mouth daily.     FLUoxetine 20 MG tablet  Commonly known as:  PROZAC  Take  20 mg by mouth 2 (two) times daily.     furosemide 40 MG tablet  Commonly known as:  LASIX  Take 1 tablet (40 mg total) by mouth daily.     gabapentin 800 MG tablet  Commonly known as:  NEURONTIN  Take 800 mg by mouth 3 (three) times daily.     hydrochlorothiazide 12.5 MG capsule  Commonly known as:  MICROZIDE  Take 12.5 mg by mouth daily.     insulin detemir 100 UNIT/ML injection  Commonly known as:  LEVEMIR  Inject 140 Units into the skin every morning. *uses 70 units on each side     levothyroxine 25 MCG tablet  Commonly known as:  SYNTHROID, LEVOTHROID  Take 25 mcg by mouth every morning.     lisinopril 5 MG tablet  Commonly known as:  PRINIVIL,ZESTRIL  Take 5 mg by mouth daily.     metoCLOPramide 5 MG tablet  Commonly known as:  REGLAN  Take 5 mg by mouth 3 (three) times daily.     NOVOLOG 100 UNIT/ML injection  Generic drug:  insulin aspart  Inject 36-48 Units into the skin 3 (three) times daily with meals. *per sliding scale     oxybutynin 5 MG tablet  Commonly known as:  DITROPAN  Take 5 mg by mouth every morning.     oxyCODONE-acetaminophen 10-325 MG per tablet  Commonly known as:  PERCOCET  Take 1 tablet by mouth every 4 (four) hours as needed for pain.      ASK your doctor about these medications       omeprazole 20 MG capsule  Commonly known as:  PRILOSEC  Take 20 mg by mouth daily.       Allergies  Allergen Reactions  . Gadolinium      Code: VOM, Desc: Pt began vomiting immed post infusion of multihance, Onset Date: [id number]   . Ivp Dye [Iodinated Diagnostic Agents] Nausea And Vomiting  . Metformin Other (See Comments)     diarrhea  . Penicillins Hives       Follow-up Information   Follow up with TALBOT, DAVID C, MD In 1 week. (Needs outpatient sleep stufy and PFTs. as needed)    Specialty:  Internal Medicine   Contact information:   [address] [place] [address] [phone]        The results of significant  diagnostics from this hospitalization (including imaging, microbiology, ancillary and laboratory) are listed below for reference.    Significant Diagnostic Studies: Dg Chest 2 View  04/20/2013   *RADIOLOGY REPORT*  Clinical Data: Shortness of breath and chest pain.  CHEST - 2 VIEW  Comparison: PA and lateral chest 12/11/2011.  Findings: Lungs are clear.  Heart size is upper normal.  No pneumothorax or pleural fluid.  No focal bony abnormality.  IMPRESSION: No acute disease.   Original Report Authenticated By: Holley Dexter, M.D.   Nm Pulmonary Perf And Vent  04/20/2013   *RADIOLOGY REPORT*  Clinical Data: Hypoxia, shortness of breath, contrast allergy  NM PULMONARY VENTILATION AND PERFUSION SCAN  Radiopharmaceutical: CURIE MAA TECHNETIUM TO 59M ALBUMIN AGGREGATED  Comparison: Nuclear medicine lung scan 01/31/2013  Findings:  Ventilation:  No focal defects are identified.  Perfusion:  No wedge-shaped perfusion defects are seen in the periphery of the lung to suggest acute pulmonary embolism.  IMPRESSION: Very low probability for pulmonary embolism.   Original Report Authenticated By: Britta Mccreedy, M.D.    Microbiology: No results found for this or any previous visit (from the past 240 hour(s)).   Labs: Basic Metabolic Panel:  Recent Labs Lab 04/20/13 1510 04/21/13 0445  NA 138 140  K 4.3 4.0  CL 98 100  CO2 31 29  GLUCOSE 155* 129*  BUN 19 20  CREATININE 0.70 0.78  CALCIUM 9.8 9.1   Liver Function Tests:  Recent Labs Lab 04/20/13 1510 04/21/13 0445  AST 29 30  ALT 32 24  ALKPHOS 123* 97  BILITOT 0.3 0.3  PROT 7.4 6.4  ALBUMIN 2.9* 2.4*   No results found for this basename: LIPASE, AMYLASE,  in the last 168 hours No results found for this basename: AMMONIA,  in the last 168 hours CBC:  Recent Labs Lab 04/20/13 1510 04/21/13 0445  WBC 9.3 8.3  NEUTROABS 5.6 4.5  HGB 12.5 12.0  HCT 37.4 35.2*  MCV 83.1 84.0  PLT 170 163   Cardiac Enzymes:  Recent  Labs Lab 04/20/13 1510 04/20/13 2235 04/21/13 0445 04/21/13 0900  CKTOTAL  --  114  --   --   TROPONINI <0.30 <0.30 <0.30 <0.30   BNP: BNP (last 3 results)  Recent Labs  04/20/13 1510  PROBNP 546.0*   CBG:  Recent Labs Lab 04/21/13 0547 04/21/13 1137 04/21/13 1631 04/21/13 2027 04/22/13 0554  GLUCAP 128* 258* 207* 265* 172*       Signed:  Denisha Hoel  [facility] 04/22/2013, 11:13 AM

## 2013-04-22 NOTE — Progress Notes (Signed)
All d/c instructions explained and given to pt.  Verbalized understanding.  Pt transported off floor by NT with w/c. To awaiting transportation to home.  Amanda Pea, Charity fundraiser.

## 2013-04-23 NOTE — Progress Notes (Signed)
Utilization Review Completed.   Shey Bartmess, RN, BSN Nurse Case Manager  [phone]  

## 2013-04-25 ENCOUNTER — Telehealth (INDEPENDENT_AMBULATORY_CARE_PROVIDER_SITE_OTHER): Payer: Self-pay

## 2013-04-25 ENCOUNTER — Telehealth (INDEPENDENT_AMBULATORY_CARE_PROVIDER_SITE_OTHER): Payer: Self-pay | Admitting: General Surgery

## 2013-04-25 NOTE — Telephone Encounter (Signed)
[name] from [facility] called requesting resume wound care orders. Pt was admitted last week for CHF and is now being D/C home. Nurse can be reached at [phone]. I advised her I wound send request to Dr Magnus Ivan and his assistant.

## 2013-04-25 NOTE — Telephone Encounter (Signed)
Called [name] from [facility] and she stated that the patient wound has closed up and that she doesn't think that we still need to do wet to dry dressing on this wound. Ok per Dr Magnus Ivan. Gaylyn Rong is aware that the patient has an up coming apt with Dr Magnus Ivan on 05-05-13

## 2013-04-30 ENCOUNTER — Encounter (INDEPENDENT_AMBULATORY_CARE_PROVIDER_SITE_OTHER): Payer: Self-pay | Admitting: Surgery

## 2013-04-30 ENCOUNTER — Telehealth (INDEPENDENT_AMBULATORY_CARE_PROVIDER_SITE_OTHER): Payer: Self-pay | Admitting: General Surgery

## 2013-04-30 NOTE — Telephone Encounter (Signed)
Jess for [facility] called and stated that patient groin wound has closed but she is having yeast infection around in the groin are and wanted to know if we can call in Nystop powder 60 grams I asked Dr Johna Sheriff who I was working in the urgent office if I can call in the Rx and stated yes. I called it into the [facility] on [address] and spoke to Garner..[phone], I gave one refill of the powder. I told Jess that the patient will need to see Dr Magnus Ivan on Monday 05-05-13

## 2013-05-05 ENCOUNTER — Ambulatory Visit (INDEPENDENT_AMBULATORY_CARE_PROVIDER_SITE_OTHER): Payer: Medicaid Other | Admitting: Surgery

## 2013-05-05 ENCOUNTER — Encounter (INDEPENDENT_AMBULATORY_CARE_PROVIDER_SITE_OTHER): Payer: Self-pay | Admitting: Surgery

## 2013-05-05 VITALS — BP 140/78 | HR 72 | Temp 98.0°F | Resp 14 | Ht 65.0 in | Wt 221.4 lb

## 2013-05-05 DIAGNOSIS — T8189XA Other complications of procedures, not elsewhere classified, initial encounter: Secondary | ICD-10-CM

## 2013-05-05 NOTE — Progress Notes (Signed)
Subjective:     Patient ID: [name], female   DOB: 12-30-1960, 52 y.o.   MRN: [id number]  HPI She is here for another check of the right groin wound. She had incision and drainage twice for large abscess. Last surgery was June 10. The wound is now closed that she has had some recurrent yeast infections.  Review of Systems     Objective:   Physical Exam On exam, the wound is completely closed. A yeast infection looks like it has resolved    Assessment:     Nonhealing surgical wound     Plan:     At this point there is nothing further to offer. She will finish the nystatin powder. We will see her as needed

## 2013-05-17 ENCOUNTER — Other Ambulatory Visit: Payer: Self-pay | Admitting: Internal Medicine

## 2013-05-23 ENCOUNTER — Other Ambulatory Visit: Payer: Self-pay | Admitting: Internal Medicine

## 2013-08-15 ENCOUNTER — Encounter (HOSPITAL_COMMUNITY): Payer: Self-pay | Admitting: Emergency Medicine

## 2013-08-15 ENCOUNTER — Emergency Department (HOSPITAL_COMMUNITY): Payer: Medicaid Other

## 2013-08-15 ENCOUNTER — Inpatient Hospital Stay (HOSPITAL_COMMUNITY)
Admission: EM | Admit: 2013-08-15 | Discharge: 2013-08-28 | DRG: 871 | Disposition: A | Discharge status: Home Health | Payer: Medicaid Other | Attending: Internal Medicine | Admitting: Internal Medicine

## 2013-08-15 DIAGNOSIS — K219 Gastro-esophageal reflux disease without esophagitis: Secondary | ICD-10-CM | POA: Diagnosis present

## 2013-08-15 DIAGNOSIS — E119 Type 2 diabetes mellitus without complications: Secondary | ICD-10-CM

## 2013-08-15 DIAGNOSIS — Z8249 Family history of ischemic heart disease and other diseases of the circulatory system: Secondary | ICD-10-CM

## 2013-08-15 DIAGNOSIS — I739 Peripheral vascular disease, unspecified: Secondary | ICD-10-CM | POA: Diagnosis present

## 2013-08-15 DIAGNOSIS — I509 Heart failure, unspecified: Secondary | ICD-10-CM | POA: Diagnosis present

## 2013-08-15 DIAGNOSIS — E1149 Type 2 diabetes mellitus with other diabetic neurological complication: Secondary | ICD-10-CM | POA: Diagnosis present

## 2013-08-15 DIAGNOSIS — F3289 Other specified depressive episodes: Secondary | ICD-10-CM

## 2013-08-15 DIAGNOSIS — S98139A Complete traumatic amputation of one unspecified lesser toe, initial encounter: Secondary | ICD-10-CM

## 2013-08-15 DIAGNOSIS — I5031 Acute diastolic (congestive) heart failure: Secondary | ICD-10-CM

## 2013-08-15 DIAGNOSIS — D638 Anemia in other chronic diseases classified elsewhere: Secondary | ICD-10-CM

## 2013-08-15 DIAGNOSIS — E1142 Type 2 diabetes mellitus with diabetic polyneuropathy: Secondary | ICD-10-CM | POA: Diagnosis present

## 2013-08-15 DIAGNOSIS — R82998 Other abnormal findings in urine: Secondary | ICD-10-CM | POA: Diagnosis present

## 2013-08-15 DIAGNOSIS — E875 Hyperkalemia: Secondary | ICD-10-CM | POA: Diagnosis present

## 2013-08-15 DIAGNOSIS — D72829 Elevated white blood cell count, unspecified: Secondary | ICD-10-CM

## 2013-08-15 DIAGNOSIS — G609 Hereditary and idiopathic neuropathy, unspecified: Secondary | ICD-10-CM

## 2013-08-15 DIAGNOSIS — N179 Acute kidney failure, unspecified: Secondary | ICD-10-CM | POA: Diagnosis present

## 2013-08-15 DIAGNOSIS — IMO0002 Reserved for concepts with insufficient information to code with codable children: Secondary | ICD-10-CM

## 2013-08-15 DIAGNOSIS — L03116 Cellulitis of left lower limb: Secondary | ICD-10-CM

## 2013-08-15 DIAGNOSIS — N289 Disorder of kidney and ureter, unspecified: Secondary | ICD-10-CM

## 2013-08-15 DIAGNOSIS — F17200 Nicotine dependence, unspecified, uncomplicated: Secondary | ICD-10-CM

## 2013-08-15 DIAGNOSIS — J4489 Other specified chronic obstructive pulmonary disease: Secondary | ICD-10-CM | POA: Diagnosis present

## 2013-08-15 DIAGNOSIS — E7800 Pure hypercholesterolemia, unspecified: Secondary | ICD-10-CM

## 2013-08-15 DIAGNOSIS — L02214 Cutaneous abscess of groin: Secondary | ICD-10-CM

## 2013-08-15 DIAGNOSIS — A419 Sepsis, unspecified organism: Principal | ICD-10-CM

## 2013-08-15 DIAGNOSIS — R739 Hyperglycemia, unspecified: Secondary | ICD-10-CM

## 2013-08-15 DIAGNOSIS — E785 Hyperlipidemia, unspecified: Secondary | ICD-10-CM | POA: Diagnosis present

## 2013-08-15 DIAGNOSIS — I5032 Chronic diastolic (congestive) heart failure: Secondary | ICD-10-CM

## 2013-08-15 DIAGNOSIS — N170 Acute kidney failure with tubular necrosis: Secondary | ICD-10-CM | POA: Diagnosis present

## 2013-08-15 DIAGNOSIS — Z794 Long term (current) use of insulin: Secondary | ICD-10-CM

## 2013-08-15 DIAGNOSIS — L0390 Cellulitis, unspecified: Secondary | ICD-10-CM

## 2013-08-15 DIAGNOSIS — N390 Urinary tract infection, site not specified: Secondary | ICD-10-CM

## 2013-08-15 DIAGNOSIS — I10 Essential (primary) hypertension: Secondary | ICD-10-CM

## 2013-08-15 DIAGNOSIS — E039 Hypothyroidism, unspecified: Secondary | ICD-10-CM

## 2013-08-15 DIAGNOSIS — I5033 Acute on chronic diastolic (congestive) heart failure: Secondary | ICD-10-CM | POA: Diagnosis not present

## 2013-08-15 DIAGNOSIS — E871 Hypo-osmolality and hyponatremia: Secondary | ICD-10-CM

## 2013-08-15 DIAGNOSIS — S88119A Complete traumatic amputation at level between knee and ankle, unspecified lower leg, initial encounter: Secondary | ICD-10-CM

## 2013-08-15 DIAGNOSIS — E1165 Type 2 diabetes mellitus with hyperglycemia: Secondary | ICD-10-CM | POA: Diagnosis present

## 2013-08-15 DIAGNOSIS — J9600 Acute respiratory failure, unspecified whether with hypoxia or hypercapnia: Secondary | ICD-10-CM | POA: Diagnosis present

## 2013-08-15 DIAGNOSIS — L02419 Cutaneous abscess of limb, unspecified: Secondary | ICD-10-CM | POA: Diagnosis present

## 2013-08-15 DIAGNOSIS — F329 Major depressive disorder, single episode, unspecified: Secondary | ICD-10-CM | POA: Diagnosis present

## 2013-08-15 DIAGNOSIS — R0602 Shortness of breath: Secondary | ICD-10-CM

## 2013-08-15 DIAGNOSIS — N3080 Other cystitis without hematuria: Secondary | ICD-10-CM | POA: Diagnosis present

## 2013-08-15 DIAGNOSIS — J449 Chronic obstructive pulmonary disease, unspecified: Secondary | ICD-10-CM | POA: Diagnosis present

## 2013-08-15 DIAGNOSIS — L97509 Non-pressure chronic ulcer of other part of unspecified foot with unspecified severity: Secondary | ICD-10-CM

## 2013-08-15 DIAGNOSIS — Z833 Family history of diabetes mellitus: Secondary | ICD-10-CM

## 2013-08-15 DIAGNOSIS — R809 Proteinuria, unspecified: Secondary | ICD-10-CM

## 2013-08-15 LAB — COMPREHENSIVE METABOLIC PANEL
ALT: 9 U/L (ref 0–35)
AST: 8 U/L (ref 0–37)
Albumin: 2.1 g/dL — ABNORMAL LOW (ref 3.5–5.2)
Alkaline Phosphatase: 90 U/L (ref 39–117)
CO2: 29 mEq/L (ref 19–32)
Chloride: 86 mEq/L — ABNORMAL LOW (ref 96–112)
Creatinine, Ser: 1.5 mg/dL — ABNORMAL HIGH (ref 0.50–1.10)
GFR calc non Af Amer: 39 mL/min — ABNORMAL LOW (ref >90)
Potassium: 3.9 mEq/L (ref 3.5–5.1)
Sodium: 125 mEq/L — ABNORMAL LOW (ref 135–145)
Total Bilirubin: 0.4 mg/dL (ref 0.3–1.2)

## 2013-08-15 LAB — CBC WITH DIFFERENTIAL/PLATELET
Basophils Absolute: 0 10*3/uL (ref 0.0–0.1)
Basophils Relative: 0 % (ref 0–1)
HCT: 34.9 % — ABNORMAL LOW (ref 36.0–46.0)
Lymphocytes Relative: 13 % (ref 12–46)
MCHC: 35 g/dL (ref 30.0–36.0)
Monocytes Absolute: 1.2 10*3/uL — ABNORMAL HIGH (ref 0.1–1.0)
Neutro Abs: 11.7 10*3/uL — ABNORMAL HIGH (ref 1.7–7.7)
Neutrophils Relative %: 78 % — ABNORMAL HIGH (ref 43–77)
RDW: 13.3 % (ref 11.5–15.5)
WBC: 15 10*3/uL — ABNORMAL HIGH (ref 4.0–10.5)

## 2013-08-15 LAB — GLUCOSE, CAPILLARY: Glucose-Capillary: 363 mg/dL — ABNORMAL HIGH (ref 70–99)

## 2013-08-15 MED ORDER — SODIUM CHLORIDE 0.9 % IV SOLN
Freq: Once | INTRAVENOUS | Status: AC
  Administered 2013-08-16: 01:00:00 via INTRAVENOUS

## 2013-08-15 MED ORDER — MORPHINE SULFATE 4 MG/ML IJ SOLN
4.0000 mg | Freq: Once | INTRAMUSCULAR | Status: AC
  Administered 2013-08-16: 4 mg via INTRAVENOUS
  Filled 2013-08-15: qty 1

## 2013-08-15 MED ORDER — ONDANSETRON HCL 4 MG/2ML IJ SOLN
4.0000 mg | Freq: Once | INTRAMUSCULAR | Status: AC
  Administered 2013-08-16: 4 mg via INTRAVENOUS
  Filled 2013-08-15: qty 2

## 2013-08-15 MED ORDER — VANCOMYCIN HCL IN DEXTROSE 1-5 GM/200ML-% IV SOLN
1000.0000 mg | Freq: Once | INTRAVENOUS | Status: AC
  Administered 2013-08-16: 1000 mg via INTRAVENOUS
  Filled 2013-08-15: qty 200

## 2013-08-15 NOTE — ED Provider Notes (Signed)
CSN: [id number]     Arrival date & time 08/15/13  1634 History   First MD Initiated Contact with Patient 08/15/13 2251     Chief Complaint  Patient presents with  . 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 She reports having redness to her shin area for quite some time but approximately 9 days ago she had a fall and injured her distal left thigh and since that time has had gradual onset of redness which is progressive, very painful and is now severe. She has had subjective fevers and chills, has been nauseated and vomiting that she has been able to tolerate clindamycin which her doctor prescribed over the last 3 days. She has had no improvement in her symptoms and was sent to the hospital today for admission I her family doctor because of the progressive redness.  Family Dr. is Dr. Donia Guiles with [facility]   The history is provided by the patient.    Past Medical History  Diagnosis Date  . Diabetic neuropathy   . Cellulitis   . Thyroid disease   . Hyperlipidemia   . Heart murmur   . Neuromuscular disorder   . Concussion as a teenager    slight  . Diarrhea     pt takes Reglan tid  . Hypothyroidism     takes Synthroid daily  . Depression     takes CYmbalta daily  . Peripheral edema     takes Lasix daily  . MRSA (methicillin resistant staph aureus) culture positive     hx of 2012  . Staph infection 2007  . Complication of anesthesia 01/2013    "didn't know where I was; who I was; talking out the top of my head" (2013-05-13)  . Hypertension   . Peripheral vascular disease   . CHF (congestive heart failure)   . Pneumonia 1980's    "hospitalized w/it" (2013/05/13)  . Chronic bronchitis   . Orthopnea     "just one; yesterday morning"  (05/13/13)  . Type II diabetes mellitus     Novolog,Levemir,and Lovaza daily  . GERD (gastroesophageal reflux disease)   . Daily headache   . Rheumatoid arthritis     "both knees" (2013/05/13)  . Anxiety   . Kidney stone     "passed on before" (05/13/2013)   Past Surgical History  Procedure Laterality Date  . Amputation    . Leg amputation below knee Right 2012  . Toe amputation Left ?2011     only 2 toes remaning.   . Tubal ligation  1990's  . Foot surgery Right 2012    "put pins in one year; amputated toes another OR" (13-May-2013)  . Wrist surgery Right 1980's    "pinched nerve" (05/13/2013)  . Dilation and curettage of uterus  1980's  . Refractive surgery Bilateral 2014  . Incision and drainage abscess N/A 01/24/2013    Procedure: INCISION AND DRAINAGE ABSCESS;  Surgeon: Clovis Pu. Cornett, MD;  Location: [facility] OR;  Service: General;  Laterality: N/A;  . Inguinal hernia repair N/A 01/28/2013    Procedure: I&D Right Groin Wound;  Surgeon: Shelly Rubenstein, MD;  Location: [facility] OR;  Service: General;  Laterality: N/A;  . Ganglion cyst excision Left 1990's    "wrist" (2013-05-13)  . Inguinal hernia repair Right 01/2013  . Bladder surgery  1970's    "stretched the mouth of my bladder" (04/21/2013)   Family History  Problem Relation Age of Onset  . Hypertension Mother   . Hyperlipidemia Mother   . Hypertension Brother   . Hyperlipidemia Brother   . Cancer Maternal Aunt   . Diabetes Paternal Aunt   . Diabetes Paternal Uncle   . Diabetes Paternal Grandmother   . Anesthesia problems Neg Hx   . Hypotension Neg Hx   . Malignant hyperthermia Neg Hx   . Pseudochol deficiency Neg Hx    History  Substance Use Topics  . Smoking status: Current Every Day Smoker -- 0.50 packs/day for 33 years    Types: Cigarettes  . Smokeless tobacco: Never Used  . Alcohol Use: No   OB History   Grav Para Term Preterm Abortions TAB SAB Ect Mult Living                 Review of Systems  All other systems  reviewed and are negative.    Allergies  Gadolinium; Ivp dye; Metformin; and Penicillins  Home Medications   Current Outpatient Rx  Name  Route  Sig  Dispense  Refill  . albuterol (PROVENTIL HFA;VENTOLIN HFA) 108 (90 BASE) MCG/ACT inhaler   Inhalation   Inhale 2 puffs into the lungs every 6 (six) hours as needed for shortness of breath.   1 Inhaler   2   . aspirin EC 81 MG tablet   Oral   Take 81 mg by mouth daily.         Marland Kitchen atorvastatin (LIPITOR) 80 MG tablet   Oral   Take 80 mg by mouth daily.         . clindamycin (CLEOCIN) 300 MG capsule   Oral   Take 300 mg by mouth 3 (three) times daily. Started on 08-11-13 for 10 days.         Marland Kitchen FLUoxetine (PROZAC) 20 MG tablet   Oral   Take 20 mg by mouth 2 (two) times daily.          . Fluticasone Furoate-Vilanterol (BREO ELLIPTA) 100-25 MCG/INH AEPB   Inhalation   Inhale 1 puff into the lungs daily.         . furosemide (LASIX) 40 MG tablet   Oral   Take 1 tablet (40 mg total) by mouth daily.   30 tablet   0   . gabapentin (NEURONTIN) 800 MG tablet   Oral   Take 800 mg by mouth 3 (three) times daily.          . hydrochlorothiazide (MICROZIDE) 12.5 MG capsule   Oral   Take 12.5 mg by mouth daily.         . insulin aspart (NOVOLOG) 100 UNIT/ML injection   Subcutaneous   Inject 36-48 Units into the skin 3 (three) times daily with meals. *per sliding scale         . insulin detemir (LEVEMIR) 100 UNIT/ML injection   Subcutaneous   Inject 140 Units into the skin every morning. *uses 70 units on each side         . levothyroxine (SYNTHROID, LEVOTHROID) 25 MCG tablet   Oral   Take 25 mcg by mouth every morning.          Marland Kitchen lisinopril (PRINIVIL,ZESTRIL) 5 MG tablet   Oral   Take 5 mg by mouth daily.         . metoCLOPramide (REGLAN) 5 MG tablet   Oral  Take 5 mg by mouth 3 (three) times daily.          Marland Kitchen nystatin (MYCOSTATIN/NYSTOP) 100000 UNIT/GM POWD   Topical   Apply topically.          Marland Kitchen omeprazole (PRILOSEC) 20 MG capsule   Oral   Take 20 mg by mouth daily.         Marland Kitchen oxybutynin (DITROPAN) 5 MG tablet   Oral   Take 5 mg by mouth every morning.           Marland Kitchen oxyCODONE-acetaminophen (PERCOCET) 10-325 MG per tablet   Oral   Take 1 tablet by mouth every 4 (four) hours as needed for pain.         Marland Kitchen tiotropium (SPIRIVA) 18 MCG inhalation capsule   Inhalation   Place 18 mcg into inhaler and inhale daily.          BP 118/36  Pulse 78  Temp(Src) 98.4 F (36.9 C) (Oral)  Resp 17  SpO2 90% Physical Exam  Nursing note and vitals reviewed. Constitutional: She appears well-developed and well-nourished. No distress.  HENT:  Head: Normocephalic and atraumatic.  Mouth/Throat: Oropharynx is clear and moist. No oropharyngeal exudate.  Eyes: Conjunctivae and EOM are normal. Pupils are equal, round, and reactive to light. Right eye exhibits no discharge. Left eye exhibits no discharge. No scleral icterus.  Neck: Normal range of motion. Neck supple. No JVD present. No thyromegaly present.  Cardiovascular: Normal rate, regular rhythm, normal heart sounds and intact distal pulses.  Exam reveals no gallop and no friction rub.   No murmur heard. Pulmonary/Chest: Effort normal and breath sounds normal. No respiratory distress. She has no wheezes. She has no rales.  Abdominal: Soft. Bowel sounds are normal. She exhibits no distension and no mass. There is no tenderness.  Obese, soft and nontender  Musculoskeletal: Normal range of motion. She exhibits tenderness.  Supple knee joint on the left, tenderness, induration and redness on the distal left lateral thigh spreading in all directions. Redness and tenderness below the knee as well  Lymphadenopathy:    She has no cervical adenopathy.  Neurological: She is alert. Coordination normal.  Skin: Skin is warm and dry. There is erythema.  No subcutaneous emphysema on palpation  Psychiatric: She has a normal mood and affect.  Her behavior is normal.    ED Course  Procedures (including critical care time) Labs Review Labs Reviewed  CBC WITH DIFFERENTIAL - Abnormal; Notable for the following:    WBC 15.0 (*)    HCT 34.9 (*)    Neutrophils Relative % 78 (*)    Neutro Abs 11.7 (*)    Monocytes Absolute 1.2 (*)    All other components within normal limits  COMPREHENSIVE METABOLIC PANEL - Abnormal; Notable for the following:    Sodium 125 (*)    Chloride 86 (*)    Glucose, Bld 379 (*)    Creatinine, Ser 1.50 (*)    Albumin 2.1 (*)    GFR calc non Af Amer 39 (*)    GFR calc Af Amer 45 (*)    All other components within normal limits  GLUCOSE, CAPILLARY - Abnormal; Notable for the following:    Glucose-Capillary 363 (*)    All other components within normal limits  CG4 I-STAT (LACTIC ACID)   Imaging Review Ct Femur Left W Contrast  08/16/2013   CLINICAL DATA:  Left thigh swelling and erythema, with left lateral thigh pain just above the knee.  EXAM:  CT OF THE LEFT FEMUR WITH CONTRAST  TECHNIQUE: Multidetector CT imaging of the left femur was performed according to the standard protocol following intravenous contrast administration. Multiplanar CT image reconstructions were also generated.  CONTRAST:  OMNIPAQUE IOHEXOL 300 MG/ML  SOLN  COMPARISON:  Left knee radiographs performed 01/16/2006, and CT of the pelvis performed 01/23/2013  FINDINGS: There is diffuse soft tissue inflammation and edema along the lateral aspect of the left thigh, extending inferiorly below the left knee. Associated significant skin thickening is seen. Findings are most compatible with cellulitis. There is no evidence of underlying abscess at this time. There is minimal associated soft tissue edema underlying the fascia, though the underlying musculature is generally intact.  There is no evidence of vascular compromise. Very small knee joint effusion is noted. The knee joint is otherwise grossly unremarkable in appearance. Enlarged left  inguinal nodes are seen, measuring up to 1.6 cm in short axis. Scattered prominent collateral vessels are noted along the visualized anterior abdominal wall.  The left hip joint is unremarkable in appearance. No hip joint effusion is identified. The visualized portions of the uterus are grossly unremarkable. Note is made of a large amount of debris and solid material filling the bladder, with trace air noted outside of the bladder. This appears new from the CT of the pelvis performed 01/23/2013.  No acute osseous abnormalities are identified.  IMPRESSION: 1. Diffuse soft tissue inflammation and edema along the lateral aspect of the left thigh, extending inferiorly below the left knee, with associated skin thickening, compatible with cellulitis. No evidence of underlying abscess at this time. Minimal associated soft tissue edema seen underlying the fascia, though the underlying musculature is grossly intact. 2. There is small knee joint effusion noted. 3. Left inguinal lymphadenopathy seen, with nodes measuring up to 1.6 cm in short axis. 4. Prominent collateral vessels noted along the anterior abdominal wall. 5. Large amount of debris and solid material filling the bladder, with trace air noted outside the bladder. This is new from the CT of the pelvis performed 01/23/2013. This is a very unusual appearance, and could conceivably reflect a colovesical fistula, or possibly severe emphysematous cystitis. Would correlate for associated symptoms.  These results were called by telephone at the time of interpretation on 08/16/2013 at 1:27 AM to Dr. Eber Hong , who verbally acknowledged these results.   Electronically Signed   By: Roanna Raider M.D.   On: 08/16/2013 01:29    EKG Interpretation   None       MDM   1. Cellulitis of left leg   2. Hyperglycemia   3. Hyponatremia   4. Renal insufficiency    The patient's exam is consistent with a deep tissue infection, cellulitis versus a deep tissue abscess.  As she is a diabetic there is some concern that she may have a necrotizing fasciitis however the rest of her leg is nontender, there does not appear to be any palpable subcutaneous emphysema and she does not have an objective fever or tachycardia. I will proceed with IV antibiotics, labs, CT scan of the leg to further evaluate for deep tissue infection such as abscess or fasciitis. Pain medications have been ordered as well.  D/w RAdiologist - no nec fasc, abx ordered.  Pt has had fever and leukocytosis which is + SIRS but fever has defervesced - will be admitted  Discuss with Dr. Lovell Sheehan who will admit. Labs otherwise show some hyponatremia, leukocytosis  Meds given in ED:  Medications  vancomycin (VANCOCIN) IVPB 1000 mg/200 mL premix (1,000 mg Intravenous New Bag/Given 08/16/13 0058)  0.9 %  sodium chloride infusion ( Intravenous New Bag/Given 08/16/13 0059)  morphine 4 MG/ML injection 4 mg (4 mg Intravenous Given 08/16/13 0058)  ondansetron (ZOFRAN) injection 4 mg (4 mg Intravenous Given 08/16/13 0058)  iohexol (OMNIPAQUE) 300 MG/ML solution 100 mL (100 mLs Intravenous Contrast Given 08/16/13 0004)    Vida Roller, MD 08/16/13 0236

## 2013-08-15 NOTE — ED Notes (Signed)
Pt's CBG is 363

## 2013-08-15 NOTE — ED Notes (Signed)
Pt's left thigh is red and extremely tender to the touch. Pt states it has been red since the 17th.

## 2013-08-15 NOTE — ED Notes (Signed)
Pt here from home with c/o redness to the left upper thigh , pt is presently being treated for cellitis of the same leg  It originally started in the lower leg

## 2013-08-16 ENCOUNTER — Encounter (HOSPITAL_COMMUNITY): Payer: Self-pay | Admitting: Radiology

## 2013-08-16 ENCOUNTER — Observation Stay (HOSPITAL_COMMUNITY): Payer: Medicaid Other

## 2013-08-16 DIAGNOSIS — I5032 Chronic diastolic (congestive) heart failure: Secondary | ICD-10-CM

## 2013-08-16 DIAGNOSIS — E1165 Type 2 diabetes mellitus with hyperglycemia: Secondary | ICD-10-CM

## 2013-08-16 DIAGNOSIS — L0390 Cellulitis, unspecified: Secondary | ICD-10-CM | POA: Insufficient documentation

## 2013-08-16 DIAGNOSIS — N179 Acute kidney failure, unspecified: Secondary | ICD-10-CM

## 2013-08-16 DIAGNOSIS — D72829 Elevated white blood cell count, unspecified: Secondary | ICD-10-CM

## 2013-08-16 DIAGNOSIS — L03116 Cellulitis of left lower limb: Secondary | ICD-10-CM | POA: Diagnosis present

## 2013-08-16 DIAGNOSIS — L02419 Cutaneous abscess of limb, unspecified: Secondary | ICD-10-CM

## 2013-08-16 DIAGNOSIS — E871 Hypo-osmolality and hyponatremia: Secondary | ICD-10-CM

## 2013-08-16 DIAGNOSIS — A419 Sepsis, unspecified organism: Secondary | ICD-10-CM | POA: Diagnosis present

## 2013-08-16 LAB — GLUCOSE, CAPILLARY
Glucose-Capillary: 413 mg/dL — ABNORMAL HIGH (ref 70–99)
Glucose-Capillary: 319 mg/dL — ABNORMAL HIGH (ref 70–99)
Glucose-Capillary: 313 mg/dL — ABNORMAL HIGH (ref 70–99)

## 2013-08-16 LAB — BASIC METABOLIC PANEL
BUN: 24 mg/dL — ABNORMAL HIGH (ref 6–23)
Calcium: 8.3 mg/dL — ABNORMAL LOW (ref 8.4–10.5)
Creatinine, Ser: 1.65 mg/dL — ABNORMAL HIGH (ref 0.50–1.10)
GFR calc Af Amer: 40 mL/min — ABNORMAL LOW (ref >90)
GFR calc non Af Amer: 35 mL/min — ABNORMAL LOW (ref >90)
Potassium: 3.6 mEq/L (ref 3.5–5.1)
Sodium: 126 mEq/L — ABNORMAL LOW (ref 135–145)

## 2013-08-16 LAB — CBC
Hemoglobin: 11.1 g/dL — ABNORMAL LOW (ref 12.0–15.0)
MCHC: 34.5 g/dL (ref 30.0–36.0)
Platelets: 230 10*3/uL (ref 150–400)
RDW: 13.4 % (ref 11.5–15.5)

## 2013-08-16 MED ORDER — INSULIN ASPART 100 UNIT/ML ~~LOC~~ SOLN: 0.0000 [IU] | Freq: Every day | SUBCUTANEOUS | Status: DC

## 2013-08-16 MED ORDER — VANCOMYCIN HCL IN DEXTROSE 1-5 GM/200ML-% IV SOLN
1000.0000 mg | Freq: Two times a day (BID) | INTRAVENOUS | Status: DC
  Filled 2013-08-16: qty 200

## 2013-08-16 MED ORDER — SODIUM CHLORIDE 0.9 % IV SOLN
INTRAVENOUS | Status: AC
  Administered 2013-08-16: 10:00:00 via INTRAVENOUS

## 2013-08-16 MED ORDER — GABAPENTIN 800 MG PO TABS
800.0000 mg | ORAL_TABLET | Freq: Three times a day (TID) | ORAL | Status: DC
  Administered 2013-08-16: 800 mg via ORAL
  Filled 2013-08-16 (×3): qty 1

## 2013-08-16 MED ORDER — HYDROMORPHONE HCL PF 1 MG/ML IJ SOLN
0.5000 mg | INTRAMUSCULAR | Status: DC | PRN
  Administered 2013-08-16 (×2): 1 mg via INTRAVENOUS
  Filled 2013-08-16 (×2): qty 1

## 2013-08-16 MED ORDER — ONDANSETRON HCL 4 MG PO TABS: 4.0000 mg | ORAL_TABLET | Freq: Four times a day (QID) | ORAL | Status: DC | PRN

## 2013-08-16 MED ORDER — GLUCERNA SHAKE PO LIQD
237.0000 mL | Freq: Every day | ORAL | Status: DC
  Administered 2013-08-17 – 2013-08-28 (×10): 237 mL via ORAL

## 2013-08-16 MED ORDER — FLUTICASONE FUROATE-VILANTEROL 100-25 MCG/INH IN AEPB
1.0000 | INHALATION_SPRAY | Freq: Every day | RESPIRATORY_TRACT | Status: DC
  Administered 2013-08-21: 1 via RESPIRATORY_TRACT

## 2013-08-16 MED ORDER — SODIUM CHLORIDE 0.9 % IV SOLN
1.0000 g | Freq: Once | INTRAVENOUS | Status: AC
  Administered 2013-08-16: 1 g via INTRAVENOUS
  Filled 2013-08-16: qty 1

## 2013-08-16 MED ORDER — ACETAMINOPHEN 650 MG RE SUPP: 650.0000 mg | Freq: Four times a day (QID) | RECTAL | Status: DC | PRN

## 2013-08-16 MED ORDER — ONDANSETRON HCL 4 MG/2ML IJ SOLN: 4.0000 mg | Freq: Three times a day (TID) | INTRAMUSCULAR | Status: DC | PRN

## 2013-08-16 MED ORDER — SODIUM CHLORIDE 0.9 % IV SOLN: INTRAVENOUS | Status: DC

## 2013-08-16 MED ORDER — INSULIN ASPART 100 UNIT/ML ~~LOC~~ SOLN
0.0000 [IU] | Freq: Three times a day (TID) | SUBCUTANEOUS | Status: DC
  Administered 2013-08-16 (×2): 11 [IU] via SUBCUTANEOUS
  Administered 2013-08-17: 5 [IU] via SUBCUTANEOUS
  Administered 2013-08-17: 3 [IU] via SUBCUTANEOUS
  Administered 2013-08-17: 8 [IU] via SUBCUTANEOUS
  Administered 2013-08-18: 15 [IU] via SUBCUTANEOUS
  Administered 2013-08-18: 11 [IU] via SUBCUTANEOUS
  Administered 2013-08-18: 15 [IU] via SUBCUTANEOUS
  Administered 2013-08-19 (×2): 5 [IU] via SUBCUTANEOUS
  Administered 2013-08-19: 8 [IU] via SUBCUTANEOUS
  Administered 2013-08-21 (×2): 3 [IU] via SUBCUTANEOUS
  Administered 2013-08-22: 2 [IU] via SUBCUTANEOUS
  Administered 2013-08-22 (×2): 5 [IU] via SUBCUTANEOUS
  Administered 2013-08-23: 8 [IU] via SUBCUTANEOUS
  Administered 2013-08-24: 2 [IU] via SUBCUTANEOUS
  Administered 2013-08-24: 5 [IU] via SUBCUTANEOUS
  Administered 2013-08-26 (×2): 3 [IU] via SUBCUTANEOUS
  Administered 2013-08-27: 2 [IU] via SUBCUTANEOUS
  Administered 2013-08-28: 5 [IU] via SUBCUTANEOUS

## 2013-08-16 MED ORDER — LEVOTHYROXINE SODIUM 25 MCG PO TABS
25.0000 ug | ORAL_TABLET | Freq: Every day | ORAL | Status: DC
  Administered 2013-08-17 – 2013-08-28 (×11): 25 ug via ORAL
  Filled 2013-08-16 (×14): qty 1

## 2013-08-16 MED ORDER — ACETAMINOPHEN 325 MG PO TABS
650.0000 mg | ORAL_TABLET | Freq: Four times a day (QID) | ORAL | Status: DC | PRN
  Administered 2013-08-16 – 2013-08-24 (×10): 650 mg via ORAL
  Filled 2013-08-16 (×10): qty 2

## 2013-08-16 MED ORDER — METOCLOPRAMIDE HCL 5 MG PO TABS
5.0000 mg | ORAL_TABLET | Freq: Three times a day (TID) | ORAL | Status: DC
  Administered 2013-08-16 – 2013-08-28 (×33): 5 mg via ORAL
  Filled 2013-08-16 (×44): qty 1

## 2013-08-16 MED ORDER — INSULIN DETEMIR 100 UNIT/ML ~~LOC~~ SOLN
70.0000 [IU] | Freq: Two times a day (BID) | SUBCUTANEOUS | Status: DC
  Administered 2013-08-16: 70 [IU] via SUBCUTANEOUS
  Filled 2013-08-16 (×2): qty 0.7

## 2013-08-16 MED ORDER — OXYCODONE-ACETAMINOPHEN 10-325 MG PO TABS: 1.0000 | ORAL_TABLET | ORAL | Status: DC | PRN

## 2013-08-16 MED ORDER — FLUOXETINE HCL 20 MG PO CAPS
20.0000 mg | ORAL_CAPSULE | Freq: Two times a day (BID) | ORAL | Status: DC
  Administered 2013-08-16 – 2013-08-28 (×24): 20 mg via ORAL
  Filled 2013-08-16 (×26): qty 1

## 2013-08-16 MED ORDER — ENOXAPARIN SODIUM 40 MG/0.4ML ~~LOC~~ SOLN
40.0000 mg | SUBCUTANEOUS | Status: DC
  Filled 2013-08-16: qty 0.4

## 2013-08-16 MED ORDER — ATORVASTATIN CALCIUM 80 MG PO TABS
80.0000 mg | ORAL_TABLET | Freq: Every day | ORAL | Status: DC
  Administered 2013-08-16 – 2013-08-28 (×13): 80 mg via ORAL
  Filled 2013-08-16 (×13): qty 1

## 2013-08-16 MED ORDER — OXYCODONE HCL 5 MG PO TABS: 5.0000 mg | ORAL_TABLET | ORAL | Status: DC | PRN

## 2013-08-16 MED ORDER — ONDANSETRON HCL 4 MG/2ML IJ SOLN
4.0000 mg | Freq: Four times a day (QID) | INTRAMUSCULAR | Status: DC | PRN
  Administered 2013-08-16: 4 mg via INTRAVENOUS
  Filled 2013-08-16: qty 2

## 2013-08-16 MED ORDER — ADULT MULTIVITAMIN W/MINERALS CH
1.0000 | ORAL_TABLET | Freq: Every day | ORAL | Status: DC
  Administered 2013-08-17 – 2013-08-28 (×12): 1 via ORAL
  Filled 2013-08-16 (×14): qty 1

## 2013-08-16 MED ORDER — OXYCODONE HCL 5 MG PO TABS
5.0000 mg | ORAL_TABLET | ORAL | Status: DC | PRN
  Administered 2013-08-18 – 2013-08-24 (×4): 5 mg via ORAL
  Filled 2013-08-16 (×4): qty 1

## 2013-08-16 MED ORDER — ASPIRIN EC 81 MG PO TBEC
81.0000 mg | DELAYED_RELEASE_TABLET | Freq: Every day | ORAL | Status: DC
  Administered 2013-08-16 – 2013-08-28 (×13): 81 mg via ORAL
  Filled 2013-08-16 (×13): qty 1

## 2013-08-16 MED ORDER — GABAPENTIN 400 MG PO CAPS
800.0000 mg | ORAL_CAPSULE | Freq: Three times a day (TID) | ORAL | Status: DC
  Administered 2013-08-16 – 2013-08-18 (×5): 800 mg via ORAL
  Filled 2013-08-16 (×9): qty 2

## 2013-08-16 MED ORDER — POLYETHYLENE GLYCOL 3350 17 G PO PACK
17.0000 g | PACK | Freq: Every day | ORAL | Status: DC | PRN
  Filled 2013-08-16: qty 1

## 2013-08-16 MED ORDER — BIOTENE DRY MOUTH MT LIQD
15.0000 mL | Freq: Two times a day (BID) | OROMUCOSAL | Status: DC
  Administered 2013-08-16: 15 mL via OROMUCOSAL

## 2013-08-16 MED ORDER — OXYCODONE-ACETAMINOPHEN 5-325 MG PO TABS
1.0000 | ORAL_TABLET | ORAL | Status: DC | PRN
  Administered 2013-08-21 – 2013-08-24 (×2): 1 via ORAL
  Filled 2013-08-16 (×2): qty 1

## 2013-08-16 MED ORDER — INSULIN ASPART 100 UNIT/ML ~~LOC~~ SOLN
4.0000 [IU] | Freq: Three times a day (TID) | SUBCUTANEOUS | Status: DC
  Administered 2013-08-17 – 2013-08-19 (×6): 4 [IU] via SUBCUTANEOUS

## 2013-08-16 MED ORDER — PANTOPRAZOLE SODIUM 40 MG PO TBEC
40.0000 mg | DELAYED_RELEASE_TABLET | Freq: Every day | ORAL | Status: DC
  Administered 2013-08-16 – 2013-08-28 (×13): 40 mg via ORAL
  Filled 2013-08-16 (×12): qty 1

## 2013-08-16 MED ORDER — OXYBUTYNIN CHLORIDE 5 MG PO TABS
5.0000 mg | ORAL_TABLET | Freq: Every day | ORAL | Status: DC
  Administered 2013-08-17 – 2013-08-28 (×12): 5 mg via ORAL
  Filled 2013-08-16 (×15): qty 1

## 2013-08-16 MED ORDER — ONDANSETRON HCL 4 MG/2ML IJ SOLN
4.0000 mg | Freq: Four times a day (QID) | INTRAMUSCULAR | Status: DC | PRN
  Filled 2013-08-16: qty 2

## 2013-08-16 MED ORDER — INSULIN ASPART 100 UNIT/ML ~~LOC~~ SOLN
12.0000 [IU] | Freq: Once | SUBCUTANEOUS | Status: AC
  Administered 2013-08-16: 12 [IU] via SUBCUTANEOUS

## 2013-08-16 MED ORDER — SODIUM CHLORIDE 0.9 % IV SOLN
INTRAVENOUS | Status: DC
  Administered 2013-08-16: 04:00:00 via INTRAVENOUS

## 2013-08-16 MED ORDER — INSULIN ASPART 100 UNIT/ML ~~LOC~~ SOLN
0.0000 [IU] | Freq: Three times a day (TID) | SUBCUTANEOUS | Status: DC
  Administered 2013-08-16: 15 [IU] via SUBCUTANEOUS

## 2013-08-16 MED ORDER — ALUM & MAG HYDROXIDE-SIMETH 200-200-20 MG/5ML PO SUSP: 30.0000 mL | Freq: Four times a day (QID) | ORAL | Status: DC | PRN

## 2013-08-16 MED ORDER — INSULIN DETEMIR 100 UNIT/ML ~~LOC~~ SOLN
140.0000 [IU] | Freq: Every day | SUBCUTANEOUS | Status: DC
  Administered 2013-08-17 – 2013-08-28 (×11): 140 [IU] via SUBCUTANEOUS
  Filled 2013-08-16 (×12): qty 1.4

## 2013-08-16 MED ORDER — HEPARIN SODIUM (PORCINE) 5000 UNIT/ML IJ SOLN
5000.0000 [IU] | Freq: Three times a day (TID) | INTRAMUSCULAR | Status: DC
  Administered 2013-08-16 – 2013-08-24 (×25): 5000 [IU] via SUBCUTANEOUS
  Filled 2013-08-16 (×28): qty 1

## 2013-08-16 MED ORDER — IOHEXOL 300 MG/ML  SOLN
100.0000 mL | Freq: Once | INTRAMUSCULAR | Status: AC | PRN
  Administered 2013-08-16: 100 mL via INTRAVENOUS

## 2013-08-16 MED ORDER — DEXTROSE 5 % IV SOLN
1.0000 g | INTRAVENOUS | Status: DC
  Administered 2013-08-16 – 2013-08-28 (×13): 1 g via INTRAVENOUS
  Filled 2013-08-16 (×14): qty 10

## 2013-08-16 MED ORDER — ZOLPIDEM TARTRATE 5 MG PO TABS: 5.0000 mg | ORAL_TABLET | Freq: Every evening | ORAL | Status: DC | PRN

## 2013-08-16 MED ORDER — MORPHINE SULFATE 4 MG/ML IJ SOLN
4.0000 mg | INTRAMUSCULAR | Status: DC | PRN
  Administered 2013-08-16 – 2013-08-27 (×11): 4 mg via INTRAVENOUS
  Filled 2013-08-16 (×12): qty 1

## 2013-08-16 MED ORDER — TIOTROPIUM BROMIDE MONOHYDRATE 18 MCG IN CAPS
18.0000 ug | ORAL_CAPSULE | Freq: Every day | RESPIRATORY_TRACT | Status: DC
  Administered 2013-08-16 – 2013-08-28 (×12): 18 ug via RESPIRATORY_TRACT
  Filled 2013-08-16 (×3): qty 5

## 2013-08-16 MED ORDER — INSULIN ASPART 100 UNIT/ML ~~LOC~~ SOLN
0.0000 [IU] | Freq: Every day | SUBCUTANEOUS | Status: DC
  Administered 2013-08-18: 3 [IU] via SUBCUTANEOUS
  Administered 2013-08-25: 2 [IU] via SUBCUTANEOUS

## 2013-08-16 MED ORDER — SODIUM CHLORIDE 0.9 % IJ SOLN: 3.0000 mL | Freq: Two times a day (BID) | INTRAMUSCULAR | Status: DC

## 2013-08-16 MED ORDER — ACETAMINOPHEN 325 MG PO TABS: 650.0000 mg | ORAL_TABLET | Freq: Four times a day (QID) | ORAL | Status: DC | PRN

## 2013-08-16 NOTE — Progress Notes (Signed)
Utilization Review completed.  

## 2013-08-16 NOTE — ED Notes (Signed)
Report given to floor. Pt awaiting transport to floor.

## 2013-08-16 NOTE — ED Notes (Signed)
Pt ask for and given [place] sandwidh

## 2013-08-16 NOTE — H&P (Signed)
[facility] History and Physical  [name]:[id number] DOB: 16-Nov-1960 DOA: 08/15/2013  Referring physician: Dr. Hyacinth Meeker PCP: [name], MD   Chief Complaint: left leg  HPI: [name] is a 52 y.o. female  With past medical history of diabetes type 2 with hemoglobin A1c of 12.9 back on June, right BKA chronic diastolic heart failure, and hypertension that comes in for left lower extremity swelling and redness that started about 9 days prior to admission after a fall. She relates she's been having fevers and chills at home accompanied by nausea and vomiting unable to tolerate the clindamycin that her primary care doctor prescribe her 3 days ago she decided to come to the ED.  In the ED: A CBC was done that showed an elevated white count a basic metabolic panel showed acute kidney injury with hyponatremia and hypochloremia a CT scan of the abdomen was done that showed no acute pathology is a CT scan of the left leg showed edema but no abscess.   Review of Systems:  Constitutional:  No weight loss, night sweats. HEENT:  No headaches, Difficulty swallowing,Tooth/dental problems,Sore throat,  No sneezing, itching, ear ache, nasal congestion, post nasal drip,  Cardio-vascular:  No chest pain, Orthopnea, PND, swelling in lower extremities, anasarca, dizziness, palpitations  GI:  No heartburn, indigestion, abdominal pain, nausea, vomiting, diarrhea, change in bowel habits, loss of appetite  Resp:  No shortness of breath with exertion or at rest. No excess mucus, no productive cough, No non-productive cough, No coughing up of blood.No change in color of mucus.No wheezing.No chest wall deformity  GU:  no dysuria, change in color of urine, no urgency or frequency. No flank pain.  Musculoskeletal:  No joint pain or swelling. No decreased range of motion. No back pain.  Psych:  No change in mood or affect. No depression or anxiety. No memory loss.   Past Medical  History  Diagnosis Date  . Diabetic neuropathy   . Cellulitis   . Thyroid disease   . Hyperlipidemia   . Heart murmur   . Neuromuscular disorder   . Concussion as a teenager    slight  . Diarrhea     pt takes Reglan tid  . Hypothyroidism     takes Synthroid daily  . Depression     takes CYmbalta daily  . Peripheral edema     takes Lasix daily  . MRSA (methicillin resistant staph aureus) culture positive     hx of 2012  . Staph infection 2007  . Complication of anesthesia 01/2013    "didn't know where I was; who I was; talking out the top of my head" (04-28-13)  . Hypertension   . Peripheral vascular disease   . CHF (congestive heart failure)   . Pneumonia 1980's    "hospitalized w/it" (April 28, 2013)  . Chronic bronchitis   . Orthopnea     "just one; yesterday morning" (04/28/2013)  . Type II diabetes mellitus     Novolog,Levemir,and Lovaza daily  . GERD (gastroesophageal reflux disease)   . Daily headache   . Rheumatoid arthritis     "both knees" (April 28, 2013)  . Anxiety   . Kidney stone     "passed on before" (2013-04-28)   Past Surgical History  Procedure Laterality Date  . Amputation    . Leg amputation below knee Right 2012  . Toe amputation Left ?2011     only 2 toes remaning.   . Tubal ligation  1990's  .  Foot surgery Right 2012    "put pins in one year; amputated toes another OR" (04/21/2013)  . Wrist surgery Right 1980's    "pinched nerve" (04/21/2013)  . Dilation and curettage of uterus  1980's  . Refractive surgery Bilateral 2014  . Incision and drainage abscess N/A 01/24/2013    Procedure: INCISION AND DRAINAGE ABSCESS;  Surgeon: Clovis Pu. Cornett, MD;  Location: [facility] OR;  Service: General;  Laterality: N/A;  . Inguinal hernia repair N/A 01/28/2013    Procedure: I&D Right Groin Wound;  Surgeon: Shelly Rubenstein, MD;  Location: [facility] OR;  Service: General;  Laterality: N/A;  . Ganglion cyst excision Left 1990's    "wrist" (04/21/2013)  . Inguinal hernia repair Right  01/2013  . Bladder surgery  1970's    "stretched the mouth of my bladder" (04/21/2013)   Social History:  reports that she has been smoking Cigarettes.  She has a 16.5 pack-year smoking history. She has never used smokeless tobacco. She reports that she does not drink alcohol or use illicit drugs.  Allergies  Allergen Reactions  . Gadolinium      Code: VOM, Desc: Pt began vomiting immed post infusion of multihance, Onset Date: [id number]   . Ivp Dye [Iodinated Diagnostic Agents] Nausea And Vomiting  . Metformin Other (See Comments)     diarrhea  . Penicillins Hives    Family History  Problem Relation Age of Onset  . Hypertension Mother   . Hyperlipidemia Mother   . Hypertension Brother   . Hyperlipidemia Brother   . Cancer Maternal Aunt   . Diabetes Paternal Aunt   . Diabetes Paternal Uncle   . Diabetes Paternal Grandmother   . Anesthesia problems Neg Hx   . Hypotension Neg Hx   . Malignant hyperthermia Neg Hx   . Pseudochol deficiency Neg Hx      Prior to Admission medications   Medication Sig Start Date End Date Taking? Authorizing Provider  albuterol (PROVENTIL HFA;VENTOLIN HFA) 108 (90 BASE) MCG/ACT inhaler Inhale 2 puffs into the lungs every 6 (six) hours as needed for shortness of breath. 04/22/13  Yes Nishant Dhungel, MD  aspirin EC 81 MG tablet Take 81 mg by mouth daily.   Yes Historical Provider, MD  atorvastatin (LIPITOR) 80 MG tablet Take 80 mg by mouth daily.   Yes Historical Provider, MD  clindamycin (CLEOCIN) 300 MG capsule Take 300 mg by mouth 3 (three) times daily. Started on 08-11-13 for 10 days.   Yes Historical Provider, MD  FLUoxetine (PROZAC) 20 MG tablet Take 20 mg by mouth 2 (two) times daily.    Yes Historical Provider, MD  Fluticasone Furoate-Vilanterol (BREO ELLIPTA) 100-25 MCG/INH AEPB Inhale 1 puff into the lungs daily.   Yes Historical Provider, MD  furosemide (LASIX) 40 MG tablet Take 1 tablet (40 mg total) by mouth daily. 04/22/13  Yes Nishant Dhungel,  MD  gabapentin (NEURONTIN) 800 MG tablet Take 800 mg by mouth 3 (three) times daily.    Yes Historical Provider, MD  hydrochlorothiazide (MICROZIDE) 12.5 MG capsule Take 12.5 mg by mouth daily.   Yes Historical Provider, MD  insulin aspart (NOVOLOG) 100 UNIT/ML injection Inject 36-48 Units into the skin 3 (three) times daily with meals. *per sliding scale   Yes Historical Provider, MD  insulin detemir (LEVEMIR) 100 UNIT/ML injection Inject 140 Units into the skin every morning. *uses 70 units on each side   Yes Historical Provider, MD  levothyroxine (SYNTHROID, LEVOTHROID) 25 MCG tablet Take 25 mcg  by mouth every morning.    Yes Historical Provider, MD  lisinopril (PRINIVIL,ZESTRIL) 5 MG tablet Take 5 mg by mouth daily.   Yes Historical Provider, MD  metoCLOPramide (REGLAN) 5 MG tablet Take 5 mg by mouth 3 (three) times daily.    Yes Historical Provider, MD  nystatin (MYCOSTATIN/NYSTOP) 100000 UNIT/GM POWD Apply topically.   Yes Historical Provider, MD  omeprazole (PRILOSEC) 20 MG capsule Take 20 mg by mouth daily.   Yes Historical Provider, MD  oxybutynin (DITROPAN) 5 MG tablet Take 5 mg by mouth every morning.     Yes Historical Provider, MD  oxyCODONE-acetaminophen (PERCOCET) 10-325 MG per tablet Take 1 tablet by mouth every 4 (four) hours as needed for pain.   Yes Historical Provider, MD  tiotropium (SPIRIVA) 18 MCG inhalation capsule Place 18 mcg into inhaler and inhale daily.   Yes Historical Provider, MD   Physical Exam: Filed Vitals:   08/16/13 0403  BP: 142/69  Pulse: 76  Temp: 98.8 F (37.1 C)  Resp: 17    BP 142/69  Pulse 76  Temp(Src) 98.8 F (37.1 C) (Oral)  Resp 17  Ht 5\' 5"  (1.651 m)  Wt 97.024 kg (213 lb 14.4 oz)  BMI 35.59 kg/m2  SpO2 93%  General:  Appears calm and comfortable Eyes: PERRL, normal lids, irises & conjunctiva ENT: grossly normal hearing. lips & tongue dry Neck: no LAD, masses or thyromegaly Cardiovascular: RRR, no m/r/g. No LE edema. Telemetry:  SR, no arrhythmias  Respiratory: CTA bilaterally, no w/r/r. Normal respiratory effort. Abdomen: soft, ntnd Skin: There is significant redness on her left lower extremity in the middle two thirds of the left tibia, also there is an indurated area around the lateral left thigh with no fluctuation warm to touch exquisitely tender. There is also small abscess ease on her right knee with no fluctuation is in no tenderness on the right knee 2 mobility. The left knee is not tender to passive movement either. Musculoskeletal: grossly normal tone BUE/BLE Psychiatric: grossly normal mood and affect, speech fluent and appropriate Neurologic: grossly non-focal.          Labs on Admission:  Basic Metabolic Panel:  Recent Labs Lab 08/15/13 2256 08/16/13 0541  NA 125* 126*  K 3.9 3.6  CL 86* 87*  CO2 29 27  GLUCOSE 379* 387*  BUN 23 24*  CREATININE 1.50* 1.65*  CALCIUM 8.6 8.3*   Liver Function Tests:  Recent Labs Lab 08/15/13 2256  AST 8  ALT 9  ALKPHOS 90  BILITOT 0.4  PROT 7.1  ALBUMIN 2.1*   No results found for this basename: LIPASE, AMYLASE,  in the last 168 hours No results found for this basename: AMMONIA,  in the last 168 hours CBC:  Recent Labs Lab 08/15/13 2256 08/16/13 0541  WBC 15.0* 13.1*  NEUTROABS 11.7*  --   HGB 12.2 11.1*  HCT 34.9* 32.2*  MCV 82.9 83.6  PLT 243 230   Cardiac Enzymes: No results found for this basename: CKTOTAL, CKMB, CKMBINDEX, TROPONINI,  in the last 168 hours  BNP (last 3 results)  Recent Labs  04/20/13 1510  PROBNP 546.0*   CBG:  Recent Labs Lab 08/15/13 2341 08/16/13 0353 08/16/13 0805  GLUCAP 363* 413* 319*    Radiological Exams on Admission: Ct Abdomen Pelvis Wo Contrast  08/16/2013   CLINICAL DATA:  Abdominal pain.  EXAM: CT ABDOMEN AND PELVIS WITHOUT CONTRAST  TECHNIQUE: Multidetector CT imaging of the abdomen and pelvis was performed following the  standard protocol without intravenous contrast.  COMPARISON:  CT  of the left femur performed 08/15/2013, and CT of the pelvis performed 01/23/2013  FINDINGS: The visualized lung bases are clear.  The liver and spleen are unremarkable in appearance. The gallbladder is within normal limits. The pancreas and left adrenal gland are unremarkable. A 1.2 cm right adrenal lesion is noted, too high in attenuation to characterize as an adrenal adenoma on this study.  Residual contrast is seen within the renal calyces. Nonspecific perinephric stranding is noted bilaterally. A small 1.0 cm hypodensity at the lower pole of the right kidney may reflect a small cyst. There is no evidence of hydronephrosis. No definite obstructing ureteral stones are seen.  No free fluid is identified. The small bowel is unremarkable in appearance. The stomach is within normal limits. No acute vascular abnormalities are seen.  The appendix is normal in caliber and contains air, without evidence for appendicitis. The colon is unremarkable in appearance.  The bladder is largely filled with contrast. The previously noted debris and air in the bladder are displaced by contrast. The amount of debris seems decreased from the recent prior study; some of the debris may have been passed in the urine. Scattered tiny foci of air are again seen outside the bladder wall; the appearance raises suspicion for emphysematous cystitis. The uterus is unremarkable in appearance. The ovaries are relatively symmetric; no suspicious adnexal masses are seen. Enlarged left inguinal nodes are again seen.  No acute osseous abnormalities are identified.  IMPRESSION: 1. Previously noted debris and air in the bladder are displaced by contrast. The amount of debris appears decreased from the recent prior study; some of the debris may have been passed in the urine. Scattered tiny foci of air again seen outside the bladder wall; the appearance raises suspicion for emphysematous cystitis. 2. Small right renal cyst. 3. 1.2 cm right adrenal lesion,  too high in attenuation to characterize as an adrenal adenoma on this study. Adrenal protocol CT or MRI could be considered for further evaluation, when and as deemed clinically appropriate. 4. Enlarged left inguinal nodes again seen.   Electronically Signed   By: Roanna Raider M.D.   On: 08/16/2013 03:51   Ct Femur Left W Contrast  08/16/2013   CLINICAL DATA:  Left thigh swelling and erythema, with left lateral thigh pain just above the knee.  EXAM: CT OF THE LEFT FEMUR WITH CONTRAST  TECHNIQUE: Multidetector CT imaging of the left femur was performed according to the standard protocol following intravenous contrast administration. Multiplanar CT image reconstructions were also generated.  CONTRAST:  OMNIPAQUE IOHEXOL 300 MG/ML  SOLN  COMPARISON:  Left knee radiographs performed 01/16/2006, and CT of the pelvis performed 01/23/2013  FINDINGS: There is diffuse soft tissue inflammation and edema along the lateral aspect of the left thigh, extending inferiorly below the left knee. Associated significant skin thickening is seen. Findings are most compatible with cellulitis. There is no evidence of underlying abscess at this time. There is minimal associated soft tissue edema underlying the fascia, though the underlying musculature is generally intact.  There is no evidence of vascular compromise. Very small knee joint effusion is noted. The knee joint is otherwise grossly unremarkable in appearance. Enlarged left inguinal nodes are seen, measuring up to 1.6 cm in short axis. Scattered prominent collateral vessels are noted along the visualized anterior abdominal wall.  The left hip joint is unremarkable in appearance. No hip joint effusion is identified. The visualized portions of  the uterus are grossly unremarkable. Note is made of a large amount of debris and solid material filling the bladder, with trace air noted outside of the bladder. This appears new from the CT of the pelvis performed 01/23/2013.  No  acute osseous abnormalities are identified.  IMPRESSION: 1. Diffuse soft tissue inflammation and edema along the lateral aspect of the left thigh, extending inferiorly below the left knee, with associated skin thickening, compatible with cellulitis. No evidence of underlying abscess at this time. Minimal associated soft tissue edema seen underlying the fascia, though the underlying musculature is grossly intact. 2. There is small knee joint effusion noted. 3. Left inguinal lymphadenopathy seen, with nodes measuring up to 1.6 cm in short axis. 4. Prominent collateral vessels noted along the anterior abdominal wall. 5. Large amount of debris and solid material filling the bladder, with trace air noted outside the bladder. This is new from the CT of the pelvis performed 01/23/2013. This is a very unusual appearance, and could conceivably reflect a colovesical fistula, or possibly severe emphysematous cystitis. Would correlate for associated symptoms.  These results were called by telephone at the time of interpretation on 08/16/2013 at 1:27 AM to Dr. Eber Hong , who verbally acknowledged these results.   Electronically Signed   By: Roanna Raider M.D.   On: 08/16/2013 01:29    EKG: Independently reviewed.none  Assessment/Plan  Cellulitis of left leg/  Sepsis/ Leukocytosis: - Continue vancomycin and Rocephin per pharmacy, check blood cultures x2. - She currently does not have an abscess on CT scan. We'll use narcotics for pain. - Continue to monitor temperature, she has remained afebrile.  Diabetes mellitus type 2, uncontrolled, with complications: - Change centimeter to twice a day 70 units start her on sliding scale insulin check hemoglobin A1c - According to her last hemoglobin A1c she is poorly controlled diabetes mellitus.  Hyponatremia/ ARF (acute renal failure): - This most likely due to decreased intravascular volume, she also has acute renal failure. - The rise in her creatinine is most  likely secondary to decreased intravascular volume, at this point I don't think it's an organ failure due to sepsis. Will hold her Lasix and her lisinopril start her on gentle IV fluid as she does have diastolic heart failure. Check basic metabolic panel in the morning. - She relates that about a liter of normal saline in the emergency room, check urinary sodium urinary creatinine.  HTN (hypertension) - Will hold her Lasix and her lisinopril. Continue her atenolol.    Chronic diastolic heart failure - Seems to be compensated we'll hold Lasix and Cipro.   Code Status: full Family Communication: none Disposition Plan: inpatient  Time spent: 80 minutes  Marinda Elk [facility] Pager [phone]

## 2013-08-16 NOTE — Progress Notes (Signed)
Patient's temperature 101.2, will give Tylenol per prn order.  Daphane Shepherd, NP notified.  No new orders given.  Will continue to monitor.

## 2013-08-16 NOTE — Evaluation (Signed)
Physical Therapy Evaluation Patient Details Name: [name] MRN: [id number] DOB: Dec 17, 1960 Today's Date: 08/16/2013 Time: 4098-1191 PT Time Calculation (min): 21 min  PT Assessment / Plan / Recommendation History of Present Illness  Patient is a 52 yo female admitted with 9 day history of LLE edema and reddness following a fall.  Patient dx with cellulitis.  Patient with h/o DM with neuropathy, Rt BKA with prosthesis, HTN, PVD, CHF, anxiety.  Clinical Impression  Patient presents with problems listed below.  Will benefit from acute PT to maximize independence prior to discharge home.  Patient needs to achieve mod I level to return home alone.  Recommend HHPT at discharge.    PT Assessment  Patient needs continued PT services    Follow Up Recommendations  Home health PT;Supervision - Intermittent    Does the patient have the potential to tolerate intense rehabilitation      Barriers to Discharge Decreased caregiver support Patient lives alone.  Has prn assist    Equipment Recommendations  None recommended by PT    Recommendations for Other Services     Frequency Min 4X/week    Precautions / Restrictions Precautions Precautions: Fall Restrictions Weight Bearing Restrictions: No   Pertinent Vitals/Pain Pain 10/10 impacting functional mobility.      Mobility  Bed Mobility Bed Mobility: Supine to Sit;Sit to Supine Supine to Sit: 4: Min assist;With rails;HOB elevated Sit to Supine: 4: Min guard;With rail;HOB elevated Details for Bed Mobility Assistance: Verbal cues for technique.  Assist to raise trunk to sitting.  Once upright, patient with good sitting balance.  Pain increases with sitting. Transfers Transfers: Not assessed (Unable due to pain)    Exercises     PT Diagnosis: Difficulty walking;Generalized weakness;Acute pain  PT Problem List: Decreased strength;Decreased activity tolerance;Decreased mobility;Decreased cognition;Decreased knowledge of use of  DME;Impaired sensation;Pain PT Treatment Interventions: DME instruction;Gait training;Stair training;Functional mobility training;Patient/family education     PT Goals(Current goals can be found in the care plan section) Acute Rehab PT Goals Patient Stated Goal: To decrease pain. PT Goal Formulation: With patient Time For Goal Achievement: 08/23/13 Potential to Achieve Goals: Good  Visit Information  Last PT Received On: 08/16/13 Assistance Needed: +1 History of Present Illness: Patient is a 52 yo female admitted with 9 day history of LLE edema and reddness following a fall.  Patient dx with cellulitis.  Patient with h/o DM with neuropathy, Rt BKA with prosthesis, HTN, PVD, CHF, anxiety.       Prior Functioning  Home Living Family/patient expects to be discharged to:: Private residence Living Arrangements: Alone Available Help at Discharge: Family;Friend(s);Available PRN/intermittently Type of Home: Apartment [facility] [employer]) Home Access: Stairs to enter Secretary/administrator of Steps: 3 Entrance Stairs-Rails: None Home Layout: One level Home Equipment: Environmental consultant - 2 wheels;Cane - single point;Bedside commode;Shower seat;Wheelchair - manual Prior Function Level of Independence: Independent with assistive device(s);Needs assistance Gait / Transfers Assistance Needed: Patient uses cane and prosthesis for gait ADL's / Homemaking Assistance Needed: Assist with transport for errands/appointments. Communication Communication: No difficulties    Cognition  Cognition Arousal/Alertness: Lethargic;Suspect due to medications Behavior During Therapy: Flat affect Overall Cognitive Status: Within Functional Limits for tasks assessed    Extremity/Trunk Assessment Upper Extremity Assessment Upper Extremity Assessment: Overall WFL for tasks assessed Lower Extremity Assessment Lower Extremity Assessment: RLE deficits/detail;LLE deficits/detail RLE Deficits / Details: BKA LLE  Deficits / Details: Edema and reddness throughout.  Strength 3/5 due to pain. LLE: Unable to fully assess due to pain  LLE Sensation: history of peripheral neuropathy LLE Coordination: decreased gross motor Cervical / Trunk Assessment Cervical / Trunk Assessment: Normal   Balance Balance Balance Assessed: Yes Static Sitting Balance Static Sitting - Balance Support: No upper extremity supported;Feet unsupported Static Sitting - Level of Assistance: 5: Stand by assistance Static Sitting - Comment/# of Minutes: 6 minutes  End of Session PT - End of Session Equipment Utilized During Treatment:  (Oxygen off as PT entered room.   RN notified.) Activity Tolerance: Patient limited by pain;Patient limited by lethargy Patient left: in bed;with call bell/phone within reach Nurse Communication: Mobility status;Patient requests pain meds  GP     Vena Austria 08/16/2013, 2:13 PM Durenda Hurt. Renaldo Fiddler, [facility] Acute Rehab Services Pager [phone]

## 2013-08-16 NOTE — Progress Notes (Signed)
ANTIBIOTIC CONSULT NOTE - INITIAL  Pharmacy Consult for Vancomycin Indication: cellulitis  Allergies  Allergen Reactions  . Gadolinium      Code: VOM, Desc: Pt began vomiting immed post infusion of multihance, Onset Date: [id number]   . Ivp Dye [Iodinated Diagnostic Agents] Nausea And Vomiting  . Metformin Other (See Comments)     diarrhea  . Penicillins Hives    Patient Measurements:   Adjusted Body Weight: pending  Vital Signs: Temp: 98.4 F (36.9 C) (12/27 0223) Temp src: Oral (12/27 0223) BP: 118/36 mmHg (12/27 0223) Pulse Rate: 78 (12/27 0223) Intake/Output from previous day:   Intake/Output from this shift:    Labs:  Recent Labs  08/15/13 2256  WBC 15.0*  HGB 12.2  PLT 243  CREATININE 1.50*   The CrCl is unknown because both a height and weight (above a minimum accepted value) are required for this calculation. No results found for this basename: VANCOTROUGH, VANCOPEAK, VANCORANDOM, GENTTROUGH, GENTPEAK, GENTRANDOM, TOBRATROUGH, TOBRAPEAK, TOBRARND, AMIKACINPEAK, AMIKACINTROU, AMIKACIN,  in the last 72 hours   Microbiology: No results found for this or any previous visit (from the past 720 hour(s)).  Medical History: Past Medical History  Diagnosis Date  . Diabetic neuropathy   . Cellulitis   . Thyroid disease   . Hyperlipidemia   . Heart murmur   . Neuromuscular disorder   . Concussion as a teenager    slight  . Diarrhea     pt takes Reglan tid  . Hypothyroidism     takes Synthroid daily  . Depression     takes CYmbalta daily  . Peripheral edema     takes Lasix daily  . MRSA (methicillin resistant staph aureus) culture positive     hx of 2012  . Staph infection 2007  . Complication of anesthesia 01/2013    "didn't know where I was; who I was; talking out the top of my head" (05-18-13)  . Hypertension   . Peripheral vascular disease   . CHF (congestive heart failure)   . Pneumonia 1980's    "hospitalized w/it" (May 18, 2013)  . Chronic  bronchitis   . Orthopnea     "just one; yesterday morning" (05/18/2013)  . Type II diabetes mellitus     Novolog,Levemir,and Lovaza daily  . GERD (gastroesophageal reflux disease)   . Daily headache   . Rheumatoid arthritis     "both knees" (05/18/2013)  . Anxiety   . Kidney stone     "passed on before" (2013/05/18)    Medications:  Scheduled:  . enoxaparin (LOVENOX) injection  30 mg Subcutaneous Q24H  . sodium chloride  3 mL Intravenous Q12H   Assessment: 52 yo female admitted with cellulitis of left upper thigh.  PTA being treated for cellulitis of lower left leg with clindamycin as an outpatient.  Given ertapenem x 1 dose in ED at 315 AM, and vancomycin 1g at 0058.  Scr 1.5, est CrCl ~ 50 ml/min.  Weight from 05/05/13 ~ 100 kg.  Goal of Therapy:  Vancomycin trough level 10-15 mcg/ml  Plan:  1. Vancomycin 1g IV q 12 hrs. 2. Will f/u pt current weight when able. 3. F/u vancomycin trough at steady stated. 4. F/u clinical course and renal function.  Tad Moore, BCPS  Clinical Pharmacist Pager [phone]  08/16/2013 3:43 AM

## 2013-08-16 NOTE — Progress Notes (Signed)
Patient stated she takes 140 units Levemir in am not 70 units in the am and 70 units at night.  Daphane Shepherd, NP notified.

## 2013-08-16 NOTE — Progress Notes (Signed)
INITIAL NUTRITION ASSESSMENT  DOCUMENTATION CODES Per approved criteria  -Obesity Unspecified   INTERVENTION: Provide Glucerna Shake once daily Provide Multivitamin with minerals daily  NUTRITION DIAGNOSIS: Inadequate oral intake related to acute illness as evidenced by PO intake 0-30% of meals.   Goal: Pt to meet >/= 90% of their estimated nutrition needs   Monitor:  PO intake Weight Labs  Reason for Assessment: MST  52 y.o. female  Admitting Dx: Cellulitis of left leg  ASSESSMENT: 52 y.o. female with past medical history of diabetes type 2 with hemoglobin A1c of 12.9 back on June, right BKA chronic diastolic heart failure, and hypertension that comes in for left lower extremity swelling and redness that started about 9 days prior to admission after a fall. She relates she's been having fevers and chills at home accompanied by nausea and vomiting unable to tolerate the clindamycin that her primary care doctor prescribe her 3 days ago she decided to come to the ED.  Pt asleep at time of visit. Per nursing notes pt ate 30% of breakfast and 0% of lunch. Lunch tray still at bedside at time of visit (3 PM). Weight history shows 4% weight loss within the past 4 months (not significant) and 11% weight loss in the past 6 months (significant).    Height: Ht Readings from Last 1 Encounters:  08/16/13 5\' 5"  (1.651 m)    Weight: Wt Readings from Last 1 Encounters:  08/16/13 213 lb 14.4 oz (97.024 kg)    Ideal Body Weight: 118 lbs  % Ideal Body Weight: 181%  Wt Readings from Last 10 Encounters:  08/16/13 213 lb 14.4 oz (97.024 kg)  05/05/13 221 lb 6.4 oz (100.426 kg)  04/22/13 218 lb 14.4 oz (99.292 kg)  04/04/13 224 lb 3.2 oz (101.696 kg)  02/18/13 240 lb 12.8 oz (109.226 kg)  02/07/13 248 lb 6.4 oz (112.674 kg)  02/07/13 248 lb 6.4 oz (112.674 kg)  02/07/13 248 lb 6.4 oz (112.674 kg)  12/06/12 231 lb 7.7 oz (105 kg)  01/20/12 198 lb (89.812 kg)    Usual Body Weight:  unknown  % Usual Body Weight: NA  BMI:  Body mass index is 35.59 kg/(m^2).  (Obese)  Estimated Nutritional Needs: Kcal: 1700-1900 Protein: 100-110 grams Fluid: 2.6 L/day  Skin: +1 LLE edema and cellulitis; diabetic ulcer of left foot; abrasions on right knee  Diet Order: Carb Control  EDUCATION NEEDS: -No education needs identified at this time   Intake/Output Summary (Last 24 hours) at 08/16/13 1500 Last data filed at 08/16/13 1300  Gross per 24 hour  Intake 756.66 ml  Output      0 ml  Net 756.66 ml    Last BM: PTA  Labs:   Recent Labs Lab 08/15/13 2256 08/16/13 0541  NA 125* 126*  K 3.9 3.6  CL 86* 87*  CO2 29 27  BUN 23 24*  CREATININE 1.50* 1.65*  CALCIUM 8.6 8.3*  GLUCOSE 379* 387*    CBG (last 3)   Recent Labs  08/16/13 0353 08/16/13 0805 08/16/13 1149  GLUCAP 413* 319* 313*    Scheduled Meds: . aspirin EC  81 mg Oral Daily  . atorvastatin  80 mg Oral Daily  . cefTRIAXone (ROCEPHIN)  IV  1 g Intravenous Q24H  . FLUoxetine  20 mg Oral BID  . Fluticasone Furoate-Vilanterol  1 puff Inhalation Daily  . gabapentin  800 mg Oral TID  . heparin  5,000 Units Subcutaneous Q8H  . insulin aspart  0-15 Units Subcutaneous TID WC  . insulin aspart  0-5 Units Subcutaneous QHS  . insulin aspart  4 Units Subcutaneous TID WC  . insulin detemir  70 Units Subcutaneous BID  . [START ON 08/17/2013] levothyroxine  25 mcg Oral QAC breakfast  . metoCLOPramide  5 mg Oral TID  . [START ON 08/17/2013] oxybutynin  5 mg Oral QAC breakfast  . pantoprazole  40 mg Oral Daily  . tiotropium  18 mcg Inhalation Daily    Continuous Infusions: . sodium chloride 75 mL/hr at 08/16/13 1610    Past Medical History  Diagnosis Date  . Diabetic neuropathy   . Cellulitis   . Thyroid disease   . Hyperlipidemia   . Heart murmur   . Neuromuscular disorder   . Concussion as a teenager    slight  . Diarrhea     pt takes Reglan tid  . Hypothyroidism     takes Synthroid  daily  . Depression     takes CYmbalta daily  . Peripheral edema     takes Lasix daily  . MRSA (methicillin resistant staph aureus) culture positive     hx of 2012  . Staph infection 2007  . Complication of anesthesia 01/2013    "didn't know where I was; who I was; talking out the top of my head" (May 08, 2013)  . Hypertension   . Peripheral vascular disease   . CHF (congestive heart failure)   . Pneumonia 1980's    "hospitalized w/it" (2013/05/08)  . Chronic bronchitis   . Orthopnea     "just one; yesterday morning" (05/08/2013)  . Type II diabetes mellitus     Novolog,Levemir,and Lovaza daily  . GERD (gastroesophageal reflux disease)   . Daily headache   . Rheumatoid arthritis     "both knees" (2013-05-08)  . Anxiety   . Kidney stone     "passed on before" (2013-05-08)    Past Surgical History  Procedure Laterality Date  . Amputation    . Leg amputation below knee Right 2012  . Toe amputation Left ?2011     only 2 toes remaning.   . Tubal ligation  1990's  . Foot surgery Right 2012    "put pins in one year; amputated toes another OR" (05-08-13)  . Wrist surgery Right 1980's    "pinched nerve" (05/08/2013)  . Dilation and curettage of uterus  1980's  . Refractive surgery Bilateral 2014  . Incision and drainage abscess N/A 01/24/2013    Procedure: INCISION AND DRAINAGE ABSCESS;  Surgeon: Clovis Pu. Cornett, MD;  Location: [facility] OR;  Service: General;  Laterality: N/A;  . Inguinal hernia repair N/A 01/28/2013    Procedure: I&D Right Groin Wound;  Surgeon: Shelly Rubenstein, MD;  Location: [facility] OR;  Service: General;  Laterality: N/A;  . Ganglion cyst excision Left 1990's    "wrist" (May 08, 2013)  . Inguinal hernia repair Right 01/2013  . Bladder surgery  1970's    "stretched the mouth of my bladder" (May 08, 2013)    Ian Malkin RD, LDN Inpatient Clinical Dietitian Pager: [phone] After Hours Pager: [phone]

## 2013-08-17 DIAGNOSIS — I10 Essential (primary) hypertension: Secondary | ICD-10-CM

## 2013-08-17 DIAGNOSIS — F329 Major depressive disorder, single episode, unspecified: Secondary | ICD-10-CM

## 2013-08-17 DIAGNOSIS — E119 Type 2 diabetes mellitus without complications: Secondary | ICD-10-CM

## 2013-08-17 DIAGNOSIS — E039 Hypothyroidism, unspecified: Secondary | ICD-10-CM

## 2013-08-17 DIAGNOSIS — A419 Sepsis, unspecified organism: Principal | ICD-10-CM

## 2013-08-17 DIAGNOSIS — L0291 Cutaneous abscess, unspecified: Secondary | ICD-10-CM

## 2013-08-17 LAB — CBC
Hemoglobin: 11.5 g/dL — ABNORMAL LOW (ref 12.0–15.0)
MCH: 28.5 pg (ref 26.0–34.0)
MCHC: 33.6 g/dL (ref 30.0–36.0)
Platelets: 230 10*3/uL (ref 150–400)
RBC: 4.03 MIL/uL (ref 3.87–5.11)
WBC: 14.2 10*3/uL — ABNORMAL HIGH (ref 4.0–10.5)

## 2013-08-17 LAB — COMPREHENSIVE METABOLIC PANEL
ALT: 9 U/L (ref 0–35)
AST: 12 U/L (ref 0–37)
Calcium: 8.8 mg/dL (ref 8.4–10.5)
Creatinine, Ser: 3.38 mg/dL — ABNORMAL HIGH (ref 0.50–1.10)
GFR calc Af Amer: 17 mL/min — ABNORMAL LOW (ref >90)
Glucose, Bld: 237 mg/dL — ABNORMAL HIGH (ref 70–99)
Sodium: 128 mEq/L — ABNORMAL LOW (ref 135–145)
Total Protein: 7.4 g/dL (ref 6.0–8.3)

## 2013-08-17 LAB — GLUCOSE, CAPILLARY
Glucose-Capillary: 170 mg/dL — ABNORMAL HIGH (ref 70–99)
Glucose-Capillary: 202 mg/dL — ABNORMAL HIGH (ref 70–99)
Glucose-Capillary: 157 mg/dL — ABNORMAL HIGH (ref 70–99)

## 2013-08-17 MED ORDER — VANCOMYCIN HCL 10 G IV SOLR
1500.0000 mg | INTRAVENOUS | Status: DC
  Administered 2013-08-17: 1500 mg via INTRAVENOUS
  Filled 2013-08-17: qty 1500

## 2013-08-17 MED ORDER — SODIUM CHLORIDE 0.9 % IV SOLN
INTRAVENOUS | Status: DC
  Administered 2013-08-17 – 2013-08-19 (×3): via INTRAVENOUS

## 2013-08-17 MED ORDER — VANCOMYCIN HCL 10 G IV SOLR
2000.0000 mg | Freq: Once | INTRAVENOUS | Status: DC
  Filled 2013-08-17: qty 2000

## 2013-08-17 MED ORDER — VANCOMYCIN HCL 10 G IV SOLR: 1500.0000 mg | INTRAVENOUS | Status: DC

## 2013-08-17 NOTE — Progress Notes (Addendum)
ANTIBIOTIC CONSULT NOTE - INITIAL  Pharmacy Consult for Vancomycin Indication: Cellulitis   Allergies  Allergen Reactions  . Gadolinium      Code: VOM, Desc: Pt began vomiting immed post infusion of multihance, Onset Date: [id number]   . Ivp Dye [Iodinated Diagnostic Agents] Nausea And Vomiting  . Metformin Other (See Comments)     diarrhea  . Penicillins Hives    Patient Measurements: Height: 5\' 5"  (165.1 cm) Weight: 219 lb 9.6 oz (99.61 kg) IBW/kg (Calculated) : 57 Adjusted Body Weight: n/a  Vital Signs: Temp: 101.3 F (38.5 C) (12/28 1726) Temp src: Oral (12/28 1726) BP: 133/57 mmHg (12/28 1726) Pulse Rate: 87 (12/28 1726) Intake/Output from previous day: 12/27 0701 - 12/28 0700 In: 1103.3 [P.O.:120; I.V.:933.3; IV Piggyback:50] Out: -  Intake/Output from this shift: Total I/O In: 290 [P.O.:240; IV Piggyback:50] Out: -   Labs:  Recent Labs  08/15/13 2256 08/16/13 0541 08/17/13 1030  WBC 15.0* 13.1* 14.2*  HGB 12.2 11.1* 11.5*  PLT 243 230 230  CREATININE 1.50* 1.65* 3.38*   Estimated Creatinine Clearance: 22.7 ml/min (by C-G formula based on Cr of 3.38). No results found for this basename: VANCOTROUGH, Leodis Binet, VANCORANDOM, GENTTROUGH, GENTPEAK, GENTRANDOM, TOBRATROUGH, TOBRAPEAK, TOBRARND, AMIKACINPEAK, AMIKACINTROU, AMIKACIN,  in the last 72 hours   Microbiology: Recent Results (from the past 720 hour(s))  MRSA PCR SCREENING     Status: None   Collection Time    08/16/13  4:22 AM      Result Value Range Status   MRSA by PCR NEGATIVE  NEGATIVE Final   Comment:            The GeneXpert MRSA Assay (FDA     approved for NASAL specimens     only), is one component of a     comprehensive MRSA colonization     surveillance program. It is not     intended to diagnose MRSA     infection nor to guide or     monitor treatment for     MRSA infections.  CULTURE, BLOOD (ROUTINE X 2)     Status: None   Collection Time    08/16/13 11:00 AM      Result  Value Range Status   Specimen Description BLOOD RIGHT ARM   Final   Special Requests BOTTLES DRAWN AEROBIC AND ANAEROBIC 10CC   Final   Culture  Setup Time     Final   Value: 08/16/2013 18:50     Performed at [facility]   Culture     Final   Value:        BLOOD CULTURE RECEIVED NO GROWTH TO DATE CULTURE WILL BE HELD FOR 5 DAYS BEFORE ISSUING A FINAL NEGATIVE REPORT     Performed at [facility]   Report Status PENDING   Incomplete  CULTURE, BLOOD (ROUTINE X 2)     Status: None   Collection Time    08/16/13 11:15 AM      Result Value Range Status   Specimen Description BLOOD RIGHT ARM   Final   Special Requests BOTTLES DRAWN AEROBIC AND ANAEROBIC 10CC   Final   Culture  Setup Time     Final   Value: 08/16/2013 18:50     Performed at [facility]   Culture     Final   Value:        BLOOD CULTURE RECEIVED NO GROWTH TO DATE CULTURE WILL BE HELD FOR 5 DAYS BEFORE ISSUING A  FINAL NEGATIVE REPORT     Performed at [facility]   Report Status PENDING   Incomplete    Medical History: Past Medical History  Diagnosis Date  . Diabetic neuropathy   . Cellulitis   . Thyroid disease   . Hyperlipidemia   . Heart murmur   . Neuromuscular disorder   . Concussion as a teenager    slight  . Diarrhea     pt takes Reglan tid  . Hypothyroidism     takes Synthroid daily  . Depression     takes CYmbalta daily  . Peripheral edema     takes Lasix daily  . MRSA (methicillin resistant staph aureus) culture positive     hx of 2012  . Staph infection 2007  . Complication of anesthesia 01/2013    "didn't know where I was; who I was; talking out the top of my head" (05/10/2013)  . Hypertension   . Peripheral vascular disease   . CHF (congestive heart failure)   . Pneumonia 1980's    "hospitalized w/it" (05-10-13)  . Chronic bronchitis   . Orthopnea     "just one; yesterday morning" (10-May-2013)  . Type II diabetes mellitus     Novolog,Levemir,and Lovaza daily  .  GERD (gastroesophageal reflux disease)   . Daily headache   . Rheumatoid arthritis     "both knees" (2013-05-10)  . Anxiety   . Kidney stone     "passed on before" (05/10/2013)    Medications:  Prescriptions prior to admission  Medication Sig Dispense Refill  . albuterol (PROVENTIL HFA;VENTOLIN HFA) 108 (90 BASE) MCG/ACT inhaler Inhale 2 puffs into the lungs every 6 (six) hours as needed for shortness of breath.  1 Inhaler  2  . aspirin EC 81 MG tablet Take 81 mg by mouth daily.      Marland Kitchen atorvastatin (LIPITOR) 80 MG tablet Take 80 mg by mouth daily.      . clindamycin (CLEOCIN) 300 MG capsule Take 300 mg by mouth 3 (three) times daily. Started on 08-11-13 for 10 days.      Marland Kitchen FLUoxetine (PROZAC) 20 MG tablet Take 20 mg by mouth 2 (two) times daily.       . Fluticasone Furoate-Vilanterol (BREO ELLIPTA) 100-25 MCG/INH AEPB Inhale 1 puff into the lungs daily.      . furosemide (LASIX) 40 MG tablet Take 1 tablet (40 mg total) by mouth daily.  30 tablet  0  . gabapentin (NEURONTIN) 800 MG tablet Take 800 mg by mouth 3 (three) times daily.       . hydrochlorothiazide (MICROZIDE) 12.5 MG capsule Take 12.5 mg by mouth daily.      . insulin aspart (NOVOLOG) 100 UNIT/ML injection Inject 36-48 Units into the skin 3 (three) times daily with meals. *per sliding scale      . insulin detemir (LEVEMIR) 100 UNIT/ML injection Inject 140 Units into the skin every morning. *uses 70 units on each side      . levothyroxine (SYNTHROID, LEVOTHROID) 25 MCG tablet Take 25 mcg by mouth every morning.       Marland Kitchen lisinopril (PRINIVIL,ZESTRIL) 5 MG tablet Take 5 mg by mouth daily.      . metoCLOPramide (REGLAN) 5 MG tablet Take 5 mg by mouth 3 (three) times daily.       Marland Kitchen nystatin (MYCOSTATIN/NYSTOP) 100000 UNIT/GM POWD Apply topically.      Marland Kitchen omeprazole (PRILOSEC) 20 MG capsule Take 20 mg by mouth daily.      Marland Kitchen  oxybutynin (DITROPAN) 5 MG tablet Take 5 mg by mouth every morning.        Marland Kitchen oxyCODONE-acetaminophen (PERCOCET)  10-325 MG per tablet Take 1 tablet by mouth every 4 (four) hours as needed for pain.      Marland Kitchen tiotropium (SPIRIVA) 18 MCG inhalation capsule Place 18 mcg into inhaler and inhale daily.       Assessment: 27 YOF with sepsis secondary to cellulits of left leg. Currently on IV ceftriaxone and now starting IV vancomycin. CT scan does not show any abscess at this time. Blood Cx pending. CrCl ~ 22.7 mL/min (likely secondary to dehydration). WBC 14.2. Max Temp 101.3 F.   Goal of Therapy:  Vancomycin trough level 10-15 mcg/ml  Plan:  1) Vancomycin 2000 mg IV x 1 dose  2) Vancomycin maintenance dose of 1500 mg IV Q 48  3) Ceftriaxone 1 gm IV Q 24 hours  4) F/u CBC, cultures, renal fx, and patient clinical status 5) Vanc trough at steady state  Vinnie Level, PharmD.  Clinical Pharmacist Pager [phone]   Addendum: Vancomycin 1000 mg IV dose x 1 was given in ED yesterday.   Plan: -Cancel Vanc 2 gm bolus dose -Start Vanc 1500 mg IV Q 48 this evening.   Vinnie Level, PharmD.  Clinical Pharmacist Pager [phone]

## 2013-08-17 NOTE — Progress Notes (Signed)
Temp 101.3. Dr. Catha Gosselin notified. Pt given tylenol. New orders received.  Kathlene November, Zian Delair [place]

## 2013-08-17 NOTE — Progress Notes (Signed)
Spoke to [name] from pharmacy who stated it was okay to infuse vancomycin at 139ml/hr despite it being ordered to infuse at 224ml/hr. Pt has a small gauge PIV in her left wrist and would not be able to tolerate such a high rate of infusion. Stated this was ok since medication is only ordered Q48H.   [name], [name] [place]

## 2013-08-17 NOTE — Progress Notes (Signed)
[facility]                                                                                Patient Demographics  [name], is a 52 y.o. female, DOB - 08/26/1960, JWJ:[id number]  Admit date - 08/15/2013   Admitting Physician Ron Parker, MD  Outpatient Primary MD for the patient is TALBOT, Dineen Kid, MD  LOS - 2   Chief Complaint  Patient presents with  . Leg Swelling        Assessment & Plan    Principal Problem:   Cellulitis of left leg Active Problems:   Diabetes mellitus type 2, uncontrolled, with complications   Hyponatremia   HTN (hypertension)   Leukocytosis   ARF (acute renal failure)   Chronic diastolic heart failure   Sepsis   Left leg cellulitis  Sepsis secondary to Cellulitis of left leg -Continue ceftriaxone and vancomycin -Pending blood cultures -CT scan does not show any abscess at this time -Continue pain control  Diabetes mellitus type 2, uncontrolled, with complications:  -Continue home insulin regimen of Levemir 140 units daily -Continue insulin sliding scale with CBG monitoring -HbA1c 11.1 -Will consult Diabetes coordinator -Continue gabapentin  Hyponatremia -Likely secondary to dehydration with acute renal injury. -Will give gentle IV fluid rehydration due to history of diastolic CHF -Will continue to monitor. -pending urine electrolytes  ARF (acute renal failure) -Likely secondary to dehydration -Will gently hydrate with IV fluids -Pending urine electrolytes -Holding nephrotoxic agents, Lasix, HCTZ and lisinopril -Pending BMP this morning  HTN (hypertension)  -Currently controlled.  -Holding Lasix, HCTZ, and lisinopril due to the acute kidney injury. -Will add on IV medications if needed.  Chronic diastolic heart failure  -Currently compensated, not in exacerbation. -Will continue to monitor her her daily weights, intake and output, fluid restriction.  Depression -Continue  fluoxetine  Hypothyroidism -Continue Synthroid  COPD -Compensated, continued spiriva and breo  Code Status: Full  Family Communication: None at bedside.  Disposition Plan: Admitted.  Will discharge once stable.     Procedures  None  Consults   None  DVT Prophylaxis  Heparin   Lab Results  Component Value Date   PLT 230 08/16/2013    Medications  Scheduled Meds: . aspirin EC  81 mg Oral Daily  . atorvastatin  80 mg Oral Daily  . cefTRIAXone (ROCEPHIN)  IV  1 g Intravenous Q24H  . feeding supplement (GLUCERNA SHAKE)  237 mL Oral Daily  . FLUoxetine  20 mg Oral BID  . Fluticasone Furoate-Vilanterol  1 puff Inhalation Daily  . gabapentin  800 mg Oral TID  . heparin  5,000 Units Subcutaneous Q8H  . insulin aspart  0-15 Units Subcutaneous TID WC  . insulin aspart  0-5 Units Subcutaneous QHS  . insulin aspart  4 Units Subcutaneous TID WC  . insulin detemir  140 Units Subcutaneous Daily  . levothyroxine  25 mcg Oral QAC breakfast  . metoCLOPramide  5 mg Oral TID  . multivitamin with minerals  1 tablet Oral Daily  . oxybutynin  5 mg Oral QAC breakfast  . pantoprazole  40 mg Oral Daily  . tiotropium  18 mcg Inhalation Daily  Continuous Infusions:  PRN Meds:.acetaminophen, acetaminophen, morphine injection, ondansetron (ZOFRAN) IV, ondansetron, oxyCODONE, oxyCODONE-acetaminophen, polyethylene glycol  Antibiotics    Anti-infectives   Start     Dose/Rate Route Frequency Ordered Stop   08/16/13 1200  vancomycin (VANCOCIN) IVPB 1000 mg/200 mL premix  Status:  Discontinued     1,000 mg 200 mL/hr over 60 Minutes Intravenous Every 12 hours 08/16/13 0344 08/16/13 0819   08/16/13 0900  cefTRIAXone (ROCEPHIN) 1 g in dextrose 5 % 50 mL IVPB     1 g 100 mL/hr over 30 Minutes Intravenous Every 24 hours 08/16/13 0819     08/16/13 0300  ertapenem (INVANZ) 1 g in sodium chloride 0.9 % 50 mL IVPB     1 g 100 mL/hr over 30 Minutes Intravenous  Once 08/16/13 0236 08/16/13 0349    08/15/13 2315  vancomycin (VANCOCIN) IVPB 1000 mg/200 mL premix     1,000 mg 200 mL/hr over 60 Minutes Intravenous  Once 08/15/13 2302 08/16/13 0316       Time Spent in minutes   30 minutes   Sheriff Rodenberg D.O. on 08/17/2013 at 10:23 AM  Between 7am to 7pm - Pager - [phone]  After 7pm go to [email or web address] - password [email or web address]  And look for the night coverage person covering for me after hours  [facility] Office  [phone]    Subjective:   [name] seen and examined today.  Patient continues to have pain in her left leg. She states that her cellulitis has improved on the lower aspect however continues to have pain with her cellulitis on her thigh. Patient denies dizziness, chest pain, shortness of breath, abdominal pain, N/V/D/C, new weakness, numbess, tingling.    Objective:   Filed Vitals:   08/17/13 0325 08/17/13 0327 08/17/13 0613 08/17/13 0944  BP:   149/81 131/62  Pulse:   72 77  Temp:   99.4 F (37.4 C) 98.2 F (36.8 C)  TempSrc:   Oral Oral  Resp:   18 17  Height:      Weight:      SpO2: 70% 96% 98% 94%    Wt Readings from Last 3 Encounters:  08/16/13 99.61 kg (219 lb 9.6 oz)  05/05/13 100.426 kg (221 lb 6.4 oz)  04/22/13 99.292 kg (218 lb 14.4 oz)     Intake/Output Summary (Last 24 hours) at 08/17/13 1023 Last data filed at 08/17/13 0951  Gross per 24 hour  Intake    685 ml  Output      0 ml  Net    685 ml    Exam  General: Well developed, well nourished, NAD, appears stated age  HEENT: NCAT, PERRLA, EOMI, Anicteic Sclera, mucous membranes moist. No pharyngeal erythema or exudates  Neck: Supple, no JVD, no masses  Cardiovascular: S1 S2 auscultated, no rubs, murmurs or gallops. Regular rate and rhythm.  Respiratory: Clear to auscultation bilaterally with equal chest rise  Abdomen: Soft, nontender, nondistended, + bowel sounds  Extremities: warm dry without cyanosis clubbing of LLE.  Cellulitis marked on the LLE,  improving.  L thigh shows indurated area, warm to touch, tenderness to palpation.  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  Neuro: AAOx3, cranial nerves grossly intact.  Skin: Without rashes exudates or nodules, cellulitis as noted above.  Psych: Normal affect and demeanor with intact judgement and insight  Data Review   Micro Results Recent Results (from the past 240 hour(s))  MRSA PCR SCREENING     Status:  None   Collection Time    08/16/13  4:22 AM      Result Value Range Status   MRSA by PCR NEGATIVE  NEGATIVE Final   Comment:            The GeneXpert MRSA Assay (FDA     approved for NASAL specimens     only), is one component of a     comprehensive MRSA colonization     surveillance program. It is not     intended to diagnose MRSA     infection nor to guide or     monitor treatment for     MRSA infections.    Radiology Reports Ct Abdomen Pelvis Wo Contrast  08/16/2013   CLINICAL DATA:  Abdominal pain.  EXAM: CT ABDOMEN AND PELVIS WITHOUT CONTRAST  TECHNIQUE: Multidetector CT imaging of the abdomen and pelvis was performed following the standard protocol without intravenous contrast.  COMPARISON:  CT of the left femur performed 08/15/2013, and CT of the pelvis performed 01/23/2013  FINDINGS: The visualized lung bases are clear.  The liver and spleen are unremarkable in appearance. The gallbladder is within normal limits. The pancreas and left adrenal gland are unremarkable. A 1.2 cm right adrenal lesion is noted, too high in attenuation to characterize as an adrenal adenoma on this study.  Residual contrast is seen within the renal calyces. Nonspecific perinephric stranding is noted bilaterally. A small 1.0 cm hypodensity at the lower pole of the right kidney may reflect a small cyst. There is no evidence of hydronephrosis. No definite obstructing ureteral stones are seen.  No free fluid is identified. The small bowel is unremarkable in appearance. The stomach is within normal  limits. No acute vascular abnormalities are seen.  The appendix is normal in caliber and contains air, without evidence for appendicitis. The colon is unremarkable in appearance.  The bladder is largely filled with contrast. The previously noted debris and air in the bladder are displaced by contrast. The amount of debris seems decreased from the recent prior study; some of the debris may have been passed in the urine. Scattered tiny foci of air are again seen outside the bladder wall; the appearance raises suspicion for emphysematous cystitis. The uterus is unremarkable in appearance. The ovaries are relatively symmetric; no suspicious adnexal masses are seen. Enlarged left inguinal nodes are again seen.  No acute osseous abnormalities are identified.  IMPRESSION: 1. Previously noted debris and air in the bladder are displaced by contrast. The amount of debris appears decreased from the recent prior study; some of the debris may have been passed in the urine. Scattered tiny foci of air again seen outside the bladder wall; the appearance raises suspicion for emphysematous cystitis. 2. Small right renal cyst. 3. 1.2 cm right adrenal lesion, too high in attenuation to characterize as an adrenal adenoma on this study. Adrenal protocol CT or MRI could be considered for further evaluation, when and as deemed clinically appropriate. 4. Enlarged left inguinal nodes again seen.   Electronically Signed   By: Roanna Raider M.D.   On: 08/16/2013 03:51   Ct Femur Left W Contrast  08/16/2013   CLINICAL DATA:  Left thigh swelling and erythema, with left lateral thigh pain just above the knee.  EXAM: CT OF THE LEFT FEMUR WITH CONTRAST  TECHNIQUE: Multidetector CT imaging of the left femur was performed according to the standard protocol following intravenous contrast administration. Multiplanar CT image reconstructions were also generated.  CONTRAST:   OMNIPAQUE IOHEXOL 300 MG/ML  SOLN  COMPARISON:  Left knee radiographs  performed 01/16/2006, and CT of the pelvis performed 01/23/2013  FINDINGS: There is diffuse soft tissue inflammation and edema along the lateral aspect of the left thigh, extending inferiorly below the left knee. Associated significant skin thickening is seen. Findings are most compatible with cellulitis. There is no evidence of underlying abscess at this time. There is minimal associated soft tissue edema underlying the fascia, though the underlying musculature is generally intact.  There is no evidence of vascular compromise. Very small knee joint effusion is noted. The knee joint is otherwise grossly unremarkable in appearance. Enlarged left inguinal nodes are seen, measuring up to 1.6 cm in short axis. Scattered prominent collateral vessels are noted along the visualized anterior abdominal wall.  The left hip joint is unremarkable in appearance. No hip joint effusion is identified. The visualized portions of the uterus are grossly unremarkable. Note is made of a large amount of debris and solid material filling the bladder, with trace air noted outside of the bladder. This appears new from the CT of the pelvis performed 01/23/2013.  No acute osseous abnormalities are identified.  IMPRESSION: 1. Diffuse soft tissue inflammation and edema along the lateral aspect of the left thigh, extending inferiorly below the left knee, with associated skin thickening, compatible with cellulitis. No evidence of underlying abscess at this time. Minimal associated soft tissue edema seen underlying the fascia, though the underlying musculature is grossly intact. 2. There is small knee joint effusion noted. 3. Left inguinal lymphadenopathy seen, with nodes measuring up to 1.6 cm in short axis. 4. Prominent collateral vessels noted along the anterior abdominal wall. 5. Large amount of debris and solid material filling the bladder, with trace air noted outside the bladder. This is new from the CT of the pelvis performed 01/23/2013.  This is a very unusual appearance, and could conceivably reflect a colovesical fistula, or possibly severe emphysematous cystitis. Would correlate for associated symptoms.  These results were called by telephone at the time of interpretation on 08/16/2013 at 1:27 AM to Dr. Eber Hong , who verbally acknowledged these results.   Electronically Signed   By: Roanna Raider M.D.   On: 08/16/2013 01:29    CBC  Recent Labs Lab 08/15/13 2256 08/16/13 0541  WBC 15.0* 13.1*  HGB 12.2 11.1*  HCT 34.9* 32.2*  PLT 243 230  MCV 82.9 83.6  MCH 29.0 28.8  MCHC 35.0 34.5  RDW 13.3 13.4  LYMPHSABS 2.0  --   MONOABS 1.2*  --   EOSABS 0.0  --   BASOSABS 0.0  --     Chemistries   Recent Labs Lab 08/15/13 2256 08/16/13 0541  NA 125* 126*  K 3.9 3.6  CL 86* 87*  CO2 29 27  GLUCOSE 379* 387*  BUN 23 24*  CREATININE 1.50* 1.65*  CALCIUM 8.6 8.3*  AST 8  --   ALT 9  --   ALKPHOS 90  --   BILITOT 0.4  --    ------------------------------------------------------------------------------------------------------------------ estimated creatinine clearance is 46.6 ml/min (by C-G formula based on Cr of 1.65). ------------------------------------------------------------------------------------------------------------------ No results found for this basename: HGBA1C,  in the last 72 hours ------------------------------------------------------------------------------------------------------------------ No results found for this basename: CHOL, HDL, LDLCALC, TRIG, CHOLHDL, LDLDIRECT,  in the last 72 hours ------------------------------------------------------------------------------------------------------------------ No results found for this basename: TSH, T4TOTAL, FREET3, T3FREE, THYROIDAB,  in the last 72 hours ------------------------------------------------------------------------------------------------------------------ No results found for this basename: VITAMINB12, FOLATE, FERRITIN, TIBC,  IRON, RETICCTPCT,  in the last 72 hours  Coagulation profile No results found for this basename: INR, PROTIME,  in the last 168 hours  No results found for this basename: DDIMER,  in the last 72 hours  Cardiac Enzymes No results found for this basename: CK, CKMB, TROPONINI, MYOGLOBIN,  in the last 168 hours ------------------------------------------------------------------------------------------------------------------ No components found with this basename: POCBNP,

## 2013-08-18 ENCOUNTER — Inpatient Hospital Stay (HOSPITAL_COMMUNITY): Payer: Medicaid Other

## 2013-08-18 DIAGNOSIS — D638 Anemia in other chronic diseases classified elsewhere: Secondary | ICD-10-CM

## 2013-08-18 DIAGNOSIS — I5031 Acute diastolic (congestive) heart failure: Secondary | ICD-10-CM

## 2013-08-18 LAB — BASIC METABOLIC PANEL
BUN: 43 mg/dL — ABNORMAL HIGH (ref 6–23)
CO2: 19 mEq/L (ref 19–32)
Chloride: 89 mEq/L — ABNORMAL LOW (ref 96–112)
GFR calc non Af Amer: 8 mL/min — ABNORMAL LOW (ref >90)
Glucose, Bld: 343 mg/dL — ABNORMAL HIGH (ref 70–99)
Potassium: 5.4 mEq/L — ABNORMAL HIGH (ref 3.5–5.1)
Sodium: 127 mEq/L — ABNORMAL LOW (ref 135–145)

## 2013-08-18 LAB — TSH: TSH: 4.222 u[IU]/mL (ref 0.350–4.500)

## 2013-08-18 LAB — GLUCOSE, CAPILLARY
Glucose-Capillary: 238 mg/dL — ABNORMAL HIGH (ref 70–99)
Glucose-Capillary: 351 mg/dL — ABNORMAL HIGH (ref 70–99)

## 2013-08-18 LAB — CBC
HCT: 31.8 % — ABNORMAL LOW (ref 36.0–46.0)
Hemoglobin: 10.7 g/dL — ABNORMAL LOW (ref 12.0–15.0)
MCHC: 33.6 g/dL (ref 30.0–36.0)
Platelets: 286 10*3/uL (ref 150–400)
RBC: 3.84 MIL/uL — ABNORMAL LOW (ref 3.87–5.11)

## 2013-08-18 MED ORDER — HYDRALAZINE HCL 20 MG/ML IJ SOLN
10.0000 mg | Freq: Four times a day (QID) | INTRAMUSCULAR | Status: DC | PRN
  Administered 2013-08-19 – 2013-08-22 (×2): 10 mg via INTRAVENOUS
  Filled 2013-08-18 (×2): qty 1

## 2013-08-18 MED ORDER — SODIUM CHLORIDE 0.9 % IV SOLN
1.0000 g | Freq: Once | INTRAVENOUS | Status: AC
  Administered 2013-08-18: 1 g via INTRAVENOUS
  Filled 2013-08-18: qty 10

## 2013-08-18 MED ORDER — GABAPENTIN 100 MG PO CAPS
100.0000 mg | ORAL_CAPSULE | Freq: Two times a day (BID) | ORAL | Status: DC
  Administered 2013-08-19 – 2013-08-28 (×19): 100 mg via ORAL
  Filled 2013-08-18 (×22): qty 1

## 2013-08-18 MED ORDER — SODIUM POLYSTYRENE SULFONATE 15 GM/60ML PO SUSP
30.0000 g | Freq: Once | ORAL | Status: AC
  Administered 2013-08-18: 30 g via ORAL
  Filled 2013-08-18: qty 120

## 2013-08-18 NOTE — Progress Notes (Signed)
Spoke with Simona Huh from Pharmacy, cautioned to make sure not to mix Calcium Gluconate with Rocephin, and advised that Fluticasone Furoate-Vilanterol inhaler not supplied in [facility], pt will need to have home medication brought in. Will continue to monitor.

## 2013-08-18 NOTE — Progress Notes (Signed)
Pt in bed asleep, difficult to arouse or to keep aroused. When awake PERRLA, no weakness/facial droop noted, skin warm and wet. CBG 305. Held 16:00 dose of gabapentin, pt is too drowsy to take. MD notified, will continue to monitor.  Filed Vitals:   08/18/13 1621  BP: 161/67  Pulse: 109  Temp: 98.6 F (37 C)  Resp:

## 2013-08-18 NOTE — Progress Notes (Signed)
Bladder scan revealed <17 ml urine in bladder. Pt bed pad wet, linens changed, will continue to monitor.

## 2013-08-18 NOTE — Progress Notes (Addendum)
Inpatient Diabetes Program Recommendations  AACE/ADA: New Consensus Statement on Inpatient Glycemic Control (2013)  Target Ranges:  Prepandial:   less than 140 mg/dL      Peak postprandial:   less than 180 mg/dL (1-2 hours)      Critically ill patients:  140 - 180 mg/dL   Reason for Visit: MD Consult regarding better management of DM  Results for TABBETHA, KUTSCHER (MRN [id number]) as of 08/18/2013 14:35  Ref. Range 08/17/2013 07:41 08/17/2013 11:27 08/17/2013 16:16 08/17/2013 21:04 08/18/2013 07:48 08/18/2013 11:44  Glucose-Capillary Latest Range: 70-99 mg/dL 308 (H) 657 (H) 846 (H) 157 (H) 351 (H) 372 (H)   Note:  Consult received.  Waited until after lunch to assess patient-- was started on Novlog 4 units tid as meal coverage yesterday at supper and wanted more data to assess effectiveness of dose.    CBG at breakfast 351 mg/dl.  Received a total of 19 units Novolog at breakfast, reportedly ate about 50%.  By CBG was slightly higher at 372 mg/dl.  Attempted to talk with patient.  Had waited for her to get back in bed from chair and entered room immediately thereafter.  Patient lying in bed with eyes closed.  She states that she is "worn out".  Asked her if I could return tomorrow to talk to her and she was agreeable.  Asked about meal coverage at home-- states she takes Novolog according to a sliding scale and that her readings are high a lot.  Told her she was getting 4 units meal coverage here so far, but I thought she needed more.  Asked her if she thought 20 units meal coverage would be too high and she thought it would be OK.    Request MD consider increasing meal coverage to Novolog 20 units tid with meals in addition to correction scale.   Will return tomorrow to continue assessment process with patient.  Thank you for the referral.  Simya Tercero S. Elsie Lincoln, RN, CNS, CDE Inpatient Diabetes Program, team pager [phone]   Addendum: Hgb A1C is 14.7 indicating mean glucose of 375 mg/dl.  Doubt  patient is taking insulin consistently at home.  (Previous Hgb A1C was 12.9 in June 2014.  Saw notes from [facility] encounter in 2013 in medical record.

## 2013-08-18 NOTE — Progress Notes (Addendum)
[facility]                                                                                Patient Demographics  [name], is a 52 y.o. female, DOB - 1961-08-02, AVW:[id number]  Admit date - 08/15/2013   Admitting Physician Ron Parker, MD  Outpatient Primary MD for the patient is TALBOT, Dineen Kid, MD  LOS - 3   Chief Complaint  Patient presents with  . Leg Swelling        Assessment & Plan    Principal Problem:   Cellulitis of left leg Active Problems:   Diabetes mellitus type 2, uncontrolled, with complications   Hyponatremia   HTN (hypertension)   Leukocytosis   ARF (acute renal failure)   Chronic diastolic heart failure   Sepsis   Left leg cellulitis  Sepsis secondary to Cellulitis of left leg -Improving -Continue ceftriaxone and vancomycin -Blood cultures show no growth to date -CT scan does not show any abscess at this time -Continue pain control  Diabetes mellitus type 2, uncontrolled, with complications:  -Continue home insulin regimen of Levemir 140 units daily -Continue insulin sliding scale with CBG monitoring -HbA1c 11.1 -Will consult Diabetes coordinator -Continue gabapentin  Hyponatremia -Likely secondary to dehydration with acute renal injury. -Will give gentle IV fluid rehydration due to history of diastolic CHF -Will continue to monitor. 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  Hyperkalemia -Likely secondary to acute renal failure -Will give calcium gluconate, Kayexalate and obtain EKG.  HTN (hypertension)  -Currently controlled.   -Holding Lasix, HCTZ, and lisinopril due to the acute kidney injury. -Will add on IV medications if needed.  Chronic diastolic heart failure  -Currently compensated, not in exacerbation. -Will continue to monitor her her daily weights, intake and output, fluid restriction.  Depression -Continue fluoxetine  Hypothyroidism -Continue Synthroid  COPD -Compensated, continued spiriva and breo  Code Status: Full  Family Communication: None at bedside.  Disposition Plan: Admitted.  Will discharge once stable.     Procedures  None  Consults   Nephrology  DVT Prophylaxis  Heparin   Lab Results  Component Value Date   PLT 286 08/18/2013    Medications  Scheduled Meds: . aspirin EC  81 mg Oral Daily  . atorvastatin  80 mg Oral Daily  . cefTRIAXone (ROCEPHIN)  IV  1 g Intravenous Q24H  . feeding supplement (GLUCERNA SHAKE)  237 mL Oral Daily  . FLUoxetine  20 mg Oral BID  . Fluticasone Furoate-Vilanterol  1 puff Inhalation Daily  . gabapentin  800 mg Oral TID  . heparin  5,000 Units Subcutaneous Q8H  . insulin aspart  0-15 Units Subcutaneous TID WC  . insulin aspart  0-5 Units Subcutaneous QHS  . insulin aspart  4 Units Subcutaneous TID WC  . insulin detemir  140 Units Subcutaneous Daily  . levothyroxine  25  mcg Oral QAC breakfast  . metoCLOPramide  5 mg Oral TID  . multivitamin with minerals  1 tablet Oral Daily  . oxybutynin  5 mg Oral QAC breakfast  . pantoprazole  40 mg Oral Daily  . tiotropium  18 mcg Inhalation Daily   Continuous Infusions: . sodium chloride 50 mL/hr at 08/18/13 0005   PRN Meds:.acetaminophen, acetaminophen, morphine injection, ondansetron (ZOFRAN) IV, ondansetron, oxyCODONE, oxyCODONE-acetaminophen, polyethylene glycol  Antibiotics    Anti-infectives   Start     Dose/Rate Route Frequency Ordered Stop   08/19/13 1800  vancomycin (VANCOCIN) 1,500 mg in sodium chloride 0.9 % 500 mL IVPB  Status:  Discontinued     1,500 mg 250 mL/hr over  120 Minutes Intravenous Every 48 hours 08/17/13 1800 08/17/13 1804   08/17/13 1900  vancomycin (VANCOCIN) 1,500 mg in sodium chloride 0.9 % 500 mL IVPB  Status:  Discontinued     1,500 mg 250 mL/hr over 120 Minutes Intravenous Every 48 hours 08/17/13 1804 08/18/13 1129   08/17/13 1830  vancomycin (VANCOCIN) 2,000 mg in sodium chloride 0.9 % 500 mL IVPB  Status:  Discontinued     2,000 mg 250 mL/hr over 120 Minutes Intravenous  Once 08/17/13 1759 08/17/13 1802   08/16/13 1200  vancomycin (VANCOCIN) IVPB 1000 mg/200 mL premix  Status:  Discontinued     1,000 mg 200 mL/hr over 60 Minutes Intravenous Every 12 hours 08/16/13 0344 08/16/13 0819   08/16/13 0900  cefTRIAXone (ROCEPHIN) 1 g in dextrose 5 % 50 mL IVPB     1 g 100 mL/hr over 30 Minutes Intravenous Every 24 hours 08/16/13 0819     08/16/13 0300  ertapenem (INVANZ) 1 g in sodium chloride 0.9 % 50 mL IVPB     1 g 100 mL/hr over 30 Minutes Intravenous  Once 08/16/13 0236 08/16/13 0349   08/15/13 2315  vancomycin (VANCOCIN) IVPB 1000 mg/200 mL premix     1,000 mg 200 mL/hr over 60 Minutes Intravenous  Once 08/15/13 2302 08/16/13 0316       Time Spent in minutes   30 minutes   Ontario Pettengill D.O. on 08/18/2013 at 12:12 PM  Between 7am to 7pm - Pager - [phone]  After 7pm go to [email or web address] - password [email or web address]  And look for the night coverage person covering for me after hours  [facility] Office  [phone]    Subjective:   [name] seen and examined today.  Patient continues to have pain in her left leg. She states that her cellulitis has improved on the lower aspect however continues to have pain with her cellulitis on her thigh.  Objective:   Filed Vitals:   08/18/13 0556 08/18/13 0601 08/18/13 0738 08/18/13 0750  BP: 137/70   102/63  Pulse: 74   59  Temp: 99.8 F (37.7 C)   99 F (37.2 C)  TempSrc: Oral   Oral  Resp:    20  Height:      Weight:      SpO2: 86% 93% 95% 93%    Wt  Readings from Last 3 Encounters:  08/17/13 102.059 kg (225 lb)  05/05/13 100.426 kg (221 lb 6.4 oz)  04/22/13 99.292 kg (218 lb 14.4 oz)     Intake/Output Summary (Last 24 hours) at 08/18/13 1212 Last data filed at 08/18/13 0910  Gross per 24 hour  Intake 980.83 ml  Output      0 ml  Net 980.83 ml    Exam  General: Well developed, well nourished, NAD, appears stated age  HEENT: NCAT, PERRLA, EOMI, Anicteic Sclera, mucous membranes moist. No pharyngeal erythema or exudates  Neck: Supple, no JVD, no masses  Cardiovascular: S1 S2 auscultated, no rubs, murmurs or gallops. Regular rate and rhythm.  Respiratory: Clear to auscultation bilaterally with equal chest rise  Abdomen: Soft, nontender, nondistended, + bowel sounds  Extremities: warm dry without cyanosis clubbing of LLE.  Cellulitis marked on the LLE, improving.  L thigh shows indurated area, warm to touch, tenderness to palpation.    Neuro: AAOx3, cranial nerves grossly intact.  Skin: Without rashes exudates or nodules, cellulitis as noted above.  Psych: Normal affect and demeanor with intact judgement and insight  Data Review   Micro Results Recent Results (from the past 240 hour(s))  MRSA PCR SCREENING     Status: None   Collection Time    08/16/13  4:22 AM      Result Value Range Status   MRSA by PCR NEGATIVE  NEGATIVE Final   Comment:            The GeneXpert MRSA Assay (FDA     approved for NASAL specimens     only), is one component of a     comprehensive MRSA colonization     surveillance program. It is not     intended to diagnose MRSA     infection nor to guide or     monitor treatment for     MRSA infections.  CULTURE, BLOOD (ROUTINE X 2)     Status: None   Collection Time    08/16/13 11:00 AM      Result Value Range Status   Specimen Description BLOOD RIGHT ARM   Final   Special Requests BOTTLES DRAWN AEROBIC AND ANAEROBIC 10CC   Final   Culture  Setup Time     Final   Value: 08/16/2013  18:50     Performed at [facility]   Culture     Final   Value:        BLOOD CULTURE RECEIVED NO GROWTH TO DATE CULTURE WILL BE HELD FOR 5 DAYS BEFORE ISSUING A FINAL NEGATIVE REPORT     Performed at [facility]   Report Status PENDING   Incomplete  CULTURE, BLOOD (ROUTINE X 2)     Status: None   Collection Time    08/16/13 11:15 AM      Result Value Range Status   Specimen Description BLOOD RIGHT ARM   Final   Special Requests BOTTLES DRAWN AEROBIC AND ANAEROBIC 10CC   Final   Culture  Setup Time     Final   Value: 08/16/2013 18:50     Performed at [facility]   Culture     Final   Value:        BLOOD CULTURE RECEIVED NO GROWTH TO DATE CULTURE WILL BE HELD FOR 5 DAYS BEFORE ISSUING A FINAL NEGATIVE REPORT     Performed at [facility]   Report Status PENDING   Incomplete    Radiology Reports Ct Abdomen Pelvis Wo Contrast  08/16/2013   CLINICAL DATA:  Abdominal pain.  EXAM: CT ABDOMEN AND PELVIS WITHOUT CONTRAST  TECHNIQUE: Multidetector CT imaging of the abdomen and pelvis was performed following the standard protocol without intravenous contrast.  COMPARISON:  CT of the left femur performed 08/15/2013, and CT of the pelvis performed 01/23/2013  FINDINGS: The visualized lung bases are clear.  The  liver and spleen are unremarkable in appearance. The gallbladder is within normal limits. The pancreas and left adrenal gland are unremarkable. A 1.2 cm right adrenal lesion is noted, too high in attenuation to characterize as an adrenal adenoma on this study.  Residual contrast is seen within the renal calyces. Nonspecific perinephric stranding is noted bilaterally. A small 1.0 cm hypodensity at the lower pole of the right kidney may reflect a small cyst. There is no evidence of hydronephrosis. No definite obstructing ureteral stones are seen.  No free fluid is identified. The small bowel is unremarkable in appearance. The stomach is within normal limits. No acute  vascular abnormalities are seen.  The appendix is normal in caliber and contains air, without evidence for appendicitis. The colon is unremarkable in appearance.  The bladder is largely filled with contrast. The previously noted debris and air in the bladder are displaced by contrast. The amount of debris seems decreased from the recent prior study; some of the debris may have been passed in the urine. Scattered tiny foci of air are again seen outside the bladder wall; the appearance raises suspicion for emphysematous cystitis. The uterus is unremarkable in appearance. The ovaries are relatively symmetric; no suspicious adnexal masses are seen. Enlarged left inguinal nodes are again seen.  No acute osseous abnormalities are identified.  IMPRESSION: 1. Previously noted debris and air in the bladder are displaced by contrast. The amount of debris appears decreased from the recent prior study; some of the debris may have been passed in the urine. Scattered tiny foci of air again seen outside the bladder wall; the appearance raises suspicion for emphysematous cystitis. 2. Small right renal cyst. 3. 1.2 cm right adrenal lesion, too high in attenuation to characterize as an adrenal adenoma on this study. Adrenal protocol CT or MRI could be considered for further evaluation, when and as deemed clinically appropriate. 4. Enlarged left inguinal nodes again seen.   Electronically Signed   By: Roanna Raider M.D.   On: 08/16/2013 03:51   Ct Femur Left W Contrast  08/16/2013   CLINICAL DATA:  Left thigh swelling and erythema, with left lateral thigh pain just above the knee.  EXAM: CT OF THE LEFT FEMUR WITH CONTRAST  TECHNIQUE: Multidetector CT imaging of the left femur was performed according to the standard protocol following intravenous contrast administration. Multiplanar CT image reconstructions were also generated.  CONTRAST:  OMNIPAQUE IOHEXOL 300 MG/ML  SOLN  COMPARISON:  Left knee radiographs performed  01/16/2006, and CT of the pelvis performed 01/23/2013  FINDINGS: There is diffuse soft tissue inflammation and edema along the lateral aspect of the left thigh, extending inferiorly below the left knee. Associated significant skin thickening is seen. Findings are most compatible with cellulitis. There is no evidence of underlying abscess at this time. There is minimal associated soft tissue edema underlying the fascia, though the underlying musculature is generally intact.  There is no evidence of vascular compromise. Very small knee joint effusion is noted. The knee joint is otherwise grossly unremarkable in appearance. Enlarged left inguinal nodes are seen, measuring up to 1.6 cm in short axis. Scattered prominent collateral vessels are noted along the visualized anterior abdominal wall.  The left hip joint is unremarkable in appearance. No hip joint effusion is identified. The visualized portions of the uterus are grossly unremarkable. Note is made of a large amount of debris and solid material filling the bladder, with trace air noted outside of the bladder. This appears new from  the CT of the pelvis performed 01/23/2013.  No acute osseous abnormalities are identified.  IMPRESSION: 1. Diffuse soft tissue inflammation and edema along the lateral aspect of the left thigh, extending inferiorly below the left knee, with associated skin thickening, compatible with cellulitis. No evidence of underlying abscess at this time. Minimal associated soft tissue edema seen underlying the fascia, though the underlying musculature is grossly intact. 2. There is small knee joint effusion noted. 3. Left inguinal lymphadenopathy seen, with nodes measuring up to 1.6 cm in short axis. 4. Prominent collateral vessels noted along the anterior abdominal wall. 5. Large amount of debris and solid material filling the bladder, with trace air noted outside the bladder. This is new from the CT of the pelvis performed 01/23/2013. This is a  very unusual appearance, and could conceivably reflect a colovesical fistula, or possibly severe emphysematous cystitis. Would correlate for associated symptoms.  These results were called by telephone at the time of interpretation on 08/16/2013 at 1:27 AM to Dr. Eber Hong , who verbally acknowledged these results.   Electronically Signed   By: Roanna Raider M.D.   On: 08/16/2013 01:29    CBC  Recent Labs Lab 08/15/13 2256 08/16/13 0541 08/17/13 1030 08/18/13 0642  WBC 15.0* 13.1* 14.2* 15.1*  HGB 12.2 11.1* 11.5* 10.7*  HCT 34.9* 32.2* 34.2* 31.8*  PLT 243 230 230 286  MCV 82.9 83.6 84.9 82.8  MCH 29.0 28.8 28.5 27.9  MCHC 35.0 34.5 33.6 33.6  RDW 13.3 13.4 13.7 13.9  LYMPHSABS 2.0  --   --   --   MONOABS 1.2*  --   --   --   EOSABS 0.0  --   --   --   BASOSABS 0.0  --   --   --     Chemistries   Recent Labs Lab 08/15/13 2256 08/16/13 0541 08/17/13 1030 08/18/13 0642  NA 125* 126* 128* 127*  K 3.9 3.6 3.9 5.4*  CL 86* 87* 90* 89*  CO2 29 27 22 19   GLUCOSE 379* 387* 237* 343*  BUN 23 24* 32* 43*  CREATININE 1.50* 1.65* 3.38* 5.62*  CALCIUM 8.6 8.3* 8.8 9.5  AST 8  --  12  --   ALT 9  --  9  --   ALKPHOS 90  --  127*  --   BILITOT 0.4  --  0.2*  --    ------------------------------------------------------------------------------------------------------------------ estimated creatinine clearance is 13.9 ml/min (by C-G formula based on Cr of 5.62). ------------------------------------------------------------------------------------------------------------------  Recent Labs  08/16/13 0545  HGBA1C 14.7*   ------------------------------------------------------------------------------------------------------------------ No results found for this basename: CHOL, HDL, LDLCALC, TRIG, CHOLHDL, LDLDIRECT,  in the last 72 hours ------------------------------------------------------------------------------------------------------------------ No results found for this  basename: TSH, T4TOTAL, FREET3, T3FREE, THYROIDAB,  in the last 72 hours ------------------------------------------------------------------------------------------------------------------ No results found for this basename: VITAMINB12, FOLATE, FERRITIN, TIBC, IRON, RETICCTPCT,  in the last 72 hours  Coagulation profile No results found for this basename: INR, PROTIME,  in the last 168 hours  No results found for this basename: DDIMER,  in the last 72 hours  Cardiac Enzymes No results found for this basename: CK, CKMB, TROPONINI, MYOGLOBIN,  in the last 168 hours ------------------------------------------------------------------------------------------------------------------ No components found with this basename: POCBNP,

## 2013-08-18 NOTE — Consult Note (Signed)
[name] is a 52 y.o. female with past medical history of poorly control diabetes type 2, right BKA chronic diastolic heart failure, and hypertension that comes in for left lower extremity pain, swelling and redness that started about 9 days prior to admission after a fall. She has a right BKA.  She relates she's been having fevers and chills at home accompanied by nausea and vomiting unable to tolerate the clindamycin that her primary care doctor prescribe her 3 days PTA therefore she decided to come to the ED.  She has developed the onset of oliguric renal failure since admission.  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  Results for [name], [name] (MRN [id number]) as of 08/18/2013 17:39  Ref. Range 02/03/2013 11:40 02/04/2013 05:28 02/05/2013 04:40 02/06/2013 08:09 02/07/2013 05:05 04/20/2013 15:10 May 02, 2013 04:45 08/15/2013 22:56 08/16/2013 05:41 08/17/2013 10:30 08/18/2013 06:42  Creatinine Latest Range: 0.50-1.10 mg/dL 1.19 (H) 1.47 (H) 8.29 0.87 0.78 0.70 0.78 1.50 (H) 1.65 (H) 3.38 (H) 5.62 (H)   Past Medical History  Diagnosis Date  . Diabetic neuropathy   . Cellulitis   . Thyroid disease   . Hyperlipidemia   . Heart murmur   . Neuromuscular disorder   . Concussion as a teenager    slight  . Diarrhea     pt takes Reglan tid  . Hypothyroidism     takes Synthroid daily  . Depression     takes CYmbalta daily  . Peripheral edema     takes Lasix daily  . MRSA (methicillin resistant staph aureus) culture positive     hx of 2012  . Staph infection 2007  . Complication of anesthesia 01/2013    "didn't know where I was; who I was; talking out the top of my head" (May 02, 2013)  . Hypertension   . Peripheral vascular disease   . CHF (congestive heart failure)   . Pneumonia 1980's    "hospitalized w/it"  (05-02-13)  . Chronic bronchitis   . Orthopnea     "just one; yesterday morning" (05-02-2013)  . Type II diabetes mellitus     Novolog,Levemir,and Lovaza daily  . GERD (gastroesophageal reflux disease)   . Daily headache   . Rheumatoid arthritis     "both knees" (May 02, 2013)  . Anxiety   . Kidney stone     "passed on before" (02-May-2013)   Past Surgical History  Procedure Laterality Date  . Amputation    . Leg amputation below knee Right 2012  . Toe amputation Left ?2011     only 2 toes remaning.   . Tubal ligation  1990's  . Foot surgery Right 2012    "put pins in one year; amputated toes another OR" (May 02, 2013)  . Wrist surgery Right 1980's    "pinched nerve" (05-02-2013)  . Dilation and curettage of uterus  1980's  . Refractive surgery Bilateral 2014  . Incision and drainage abscess N/A 01/24/2013    Procedure: INCISION AND DRAINAGE ABSCESS;  Surgeon: Clovis Pu. Cornett, MD;  Location: [facility] OR;  Service: General;  Laterality: N/A;  . Inguinal hernia repair N/A 01/28/2013    Procedure: I&D Right Groin Wound;  Surgeon: Shelly Rubenstein, MD;  Location: [facility] OR;  Service: General;  Laterality: N/A;  . Ganglion cyst excision Left 1990's    "wrist" (05/02/2013)  . Inguinal  hernia repair Right 01/2013  . Bladder surgery  1970's    "stretched the mouth of my bladder" (04/21/2013)   Social History:  reports that she has been smoking Cigarettes.  She has a 16.5 pack-year smoking history. She has never used smokeless tobacco. She reports that she does not drink alcohol or use illicit drugs. Allergies:  Allergies  Allergen Reactions  . Gadolinium      Code: VOM, Desc: Pt began vomiting immed post infusion of multihance, Onset Date: 16109604   . Ivp Dye [Iodinated Diagnostic Agents] Nausea And Vomiting  . Metformin Other (See Comments)     diarrhea  . Penicillins Hives   Family History  Problem Relation Age of Onset  . Hypertension Mother   . Hyperlipidemia Mother   . Hypertension Brother   .  Hyperlipidemia Brother   . Cancer Maternal Aunt   . Diabetes Paternal Aunt   . Diabetes Paternal Uncle   . Diabetes Paternal Grandmother   . Anesthesia problems Neg Hx   . Hypotension Neg Hx   . Malignant hyperthermia Neg Hx   . Pseudochol deficiency Neg Hx     Medications:  Prior to Admission:  Prescriptions prior to admission  Medication Sig Dispense Refill  . albuterol (PROVENTIL HFA;VENTOLIN HFA) 108 (90 BASE) MCG/ACT inhaler Inhale 2 puffs into the lungs every 6 (six) hours as needed for shortness of breath.  1 Inhaler  2  . aspirin EC 81 MG tablet Take 81 mg by mouth daily.      Marland Kitchen atorvastatin (LIPITOR) 80 MG tablet Take 80 mg by mouth daily.      . clindamycin (CLEOCIN) 300 MG capsule Take 300 mg by mouth 3 (three) times daily. Started on 08-11-13 for 10 days.      Marland Kitchen FLUoxetine (PROZAC) 20 MG tablet Take 20 mg by mouth 2 (two) times daily.       . Fluticasone Furoate-Vilanterol (BREO ELLIPTA) 100-25 MCG/INH AEPB Inhale 1 puff into the lungs daily.      . furosemide (LASIX) 40 MG tablet Take 1 tablet (40 mg total) by mouth daily.  30 tablet  0  . gabapentin (NEURONTIN) 800 MG tablet Take 800 mg by mouth 3 (three) times daily.       . hydrochlorothiazide (MICROZIDE) 12.5 MG capsule Take 12.5 mg by mouth daily.      . insulin aspart (NOVOLOG) 100 UNIT/ML injection Inject 36-48 Units into the skin 3 (three) times daily with meals. *per sliding scale      . insulin detemir (LEVEMIR) 100 UNIT/ML injection Inject 140 Units into the skin every morning. *uses 70 units on each side      . levothyroxine (SYNTHROID, LEVOTHROID) 25 MCG tablet Take 25 mcg by mouth every morning.       Marland Kitchen lisinopril (PRINIVIL,ZESTRIL) 5 MG tablet Take 5 mg by mouth daily.      . metoCLOPramide (REGLAN) 5 MG tablet Take 5 mg by mouth 3 (three) times daily.       Marland Kitchen nystatin (MYCOSTATIN/NYSTOP) 100000 UNIT/GM POWD Apply topically.      Marland Kitchen omeprazole (PRILOSEC) 20 MG capsule Take 20 mg by mouth daily.      Marland Kitchen  oxybutynin (DITROPAN) 5 MG tablet Take 5 mg by mouth every morning.        Marland Kitchen oxyCODONE-acetaminophen (PERCOCET) 10-325 MG per tablet Take 1 tablet by mouth every 4 (four) hours as needed for pain.      Marland Kitchen tiotropium (SPIRIVA) 18 MCG inhalation capsule Place  18 mcg into inhaler and inhale daily.       Scheduled: . aspirin EC  81 mg Oral Daily  . atorvastatin  80 mg Oral Daily  . cefTRIAXone (ROCEPHIN)  IV  1 g Intravenous Q24H  . feeding supplement (GLUCERNA SHAKE)  237 mL Oral Daily  . FLUoxetine  20 mg Oral BID  . Fluticasone Furoate-Vilanterol  1 puff Inhalation Daily  . [START ON 08/19/2013] gabapentin  100 mg Oral BID  . heparin  5,000 Units Subcutaneous Q8H  . insulin aspart  0-15 Units Subcutaneous TID WC  . insulin aspart  0-5 Units Subcutaneous QHS  . insulin aspart  4 Units Subcutaneous TID WC  . insulin detemir  140 Units Subcutaneous Daily  . levothyroxine  25 mcg Oral QAC breakfast  . metoCLOPramide  5 mg Oral TID  . multivitamin with minerals  1 tablet Oral Daily  . oxybutynin  5 mg Oral QAC breakfast  . pantoprazole  40 mg Oral Daily  . tiotropium  18 mcg Inhalation Daily   ROS: c/o chronic dribbling and cough urinary leakage   Blood pressure 136/73, pulse 66, temperature 99.4 F (37.4 C), temperature source Oral, resp. rate 18, height 5\' 5"  (1.651 m), weight 102.059 kg (225 lb), SpO2 92.00%.  General appearance: slowed mentation Head: Normocephalic, without obvious abnormality, atraumatic Eyes: negative Nose: Nares normal. Septum midline. Mucosa normal. No drainage or sinus tenderness. Resp: clear to auscultation bilaterally Chest wall: no tenderness Cardio: regular rate and rhythm, S1, S2 normal, no murmur, click, rub or gallop GI: soft, non-tender; bowel sounds normal; no masses,  no organomegaly Extremities: right BKA, LLE with erytehma lat calf nontender and lat thigh with tender area of induration aprx 8x6cm or so Skin: as per extrem Neurologic: non  focal Results for orders placed during the hospital encounter of 08/15/13 (from the past 48 hour(s))  GLUCOSE, CAPILLARY     Status: Abnormal   Collection Time    08/16/13  9:35 PM      Result Value Range   Glucose-Capillary 162 (*) 70 - 99 mg/dL  GLUCOSE, CAPILLARY     Status: Abnormal   Collection Time    08/17/13  7:41 AM      Result Value Range   Glucose-Capillary 170 (*) 70 - 99 mg/dL   Comment 1 Notify RN    CBC     Status: Abnormal   Collection Time    08/17/13 10:30 AM      Result Value Range   WBC 14.2 (*) 4.0 - 10.5 K/uL   RBC 4.03  3.87 - 5.11 MIL/uL   Hemoglobin 11.5 (*) 12.0 - 15.0 g/dL   HCT 16.1 (*) 09.6 - 04.5 %   MCV 84.9  78.0 - 100.0 fL   MCH 28.5  26.0 - 34.0 pg   MCHC 33.6  30.0 - 36.0 g/dL   RDW 40.9  81.1 - 91.4 %   Platelets 230  150 - 400 K/uL  COMPREHENSIVE METABOLIC PANEL     Status: Abnormal   Collection Time    08/17/13 10:30 AM      Result Value Range   Sodium 128 (*) 135 - 145 mEq/L   Potassium 3.9  3.5 - 5.1 mEq/L   Chloride 90 (*) 96 - 112 mEq/L   CO2 22  19 - 32 mEq/L   Glucose, Bld 237 (*) 70 - 99 mg/dL   BUN 32 (*) 6 - 23 mg/dL   Creatinine, Ser 7.82 (*) 0.50 -  1.10 mg/dL   Comment: DELTA CHECK NOTED   Calcium 8.8  8.4 - 10.5 mg/dL   Total Protein 7.4  6.0 - 8.3 g/dL   Albumin 1.7 (*) 3.5 - 5.2 g/dL   AST 12  0 - 37 U/L   ALT 9  0 - 35 U/L   Alkaline Phosphatase 127 (*) 39 - 117 U/L   Total Bilirubin 0.2 (*) 0.3 - 1.2 mg/dL   GFR calc non Af Amer 15 (*) >90 mL/min   GFR calc Af Amer 17 (*) >90 mL/min   Comment: (NOTE)     The eGFR has been calculated using the CKD EPI equation.     This calculation has not been validated in all clinical situations.     eGFR's persistently <90 mL/min signify possible Chronic Kidney     Disease.  GLUCOSE, CAPILLARY     Status: Abnormal   Collection Time    08/17/13 11:27 AM      Result Value Range   Glucose-Capillary 277 (*) 70 - 99 mg/dL   Comment 1 Notify RN    GLUCOSE, CAPILLARY      Status: Abnormal   Collection Time    08/17/13  4:16 PM      Result Value Range   Glucose-Capillary 202 (*) 70 - 99 mg/dL   Comment 1 Notify RN    GLUCOSE, CAPILLARY     Status: Abnormal   Collection Time    08/17/13  9:04 PM      Result Value Range   Glucose-Capillary 157 (*) 70 - 99 mg/dL  CBC     Status: Abnormal   Collection Time    08/18/13  6:42 AM      Result Value Range   WBC 15.1 (*) 4.0 - 10.5 K/uL   RBC 3.84 (*) 3.87 - 5.11 MIL/uL   Hemoglobin 10.7 (*) 12.0 - 15.0 g/dL   HCT 30.8 (*) 65.7 - 84.6 %   MCV 82.8  78.0 - 100.0 fL   MCH 27.9  26.0 - 34.0 pg   MCHC 33.6  30.0 - 36.0 g/dL   RDW 96.2  95.2 - 84.1 %   Platelets 286  150 - 400 K/uL  BASIC METABOLIC PANEL     Status: Abnormal   Collection Time    08/18/13  6:42 AM      Result Value Range   Sodium 127 (*) 135 - 145 mEq/L   Potassium 5.4 (*) 3.5 - 5.1 mEq/L   Chloride 89 (*) 96 - 112 mEq/L   CO2 19  19 - 32 mEq/L   Glucose, Bld 343 (*) 70 - 99 mg/dL   BUN 43 (*) 6 - 23 mg/dL   Creatinine, Ser 3.24 (*) 0.50 - 1.10 mg/dL   Comment: DELTA CHECK NOTED   Calcium 9.5  8.4 - 10.5 mg/dL   GFR calc non Af Amer 8 (*) >90 mL/min   GFR calc Af Amer 9 (*) >90 mL/min   Comment: (NOTE)     The eGFR has been calculated using the CKD EPI equation.     This calculation has not been validated in all clinical situations.     eGFR's persistently <90 mL/min signify possible Chronic Kidney     Disease.  GLUCOSE, CAPILLARY     Status: Abnormal   Collection Time    08/18/13  7:48 AM      Result Value Range   Glucose-Capillary 351 (*) 70 - 99 mg/dL  GLUCOSE, CAPILLARY  Status: Abnormal   Collection Time    08/18/13 11:44 AM      Result Value Range   Glucose-Capillary 372 (*) 70 - 99 mg/dL  GLUCOSE, CAPILLARY     Status: Abnormal   Collection Time    08/18/13  4:18 PM      Result Value Range   Glucose-Capillary 305 (*) 70 - 99 mg/dL   No results found.  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

## 2013-08-18 NOTE — Progress Notes (Signed)
Angie, CMT notified pt going to ultrasound.

## 2013-08-18 NOTE — Progress Notes (Signed)
Physical Therapy Treatment Patient Details Name: [name] MRN: [id number] DOB: 07-22-1961 Today's Date: 08/18/2013 Time: 9147-8295 PT Time Calculation (min): 13 min  PT Assessment / Plan / Recommendation  History of Present Illness Patient is a 52 yo female admitted with 9 day history of LLE edema and reddness following a fall.  Patient dx with cellulitis.  Patient with h/o DM with neuropathy, Rt BKA with prosthesis, HTN, PVD, CHF, anxiety.   PT Comments   Pt able to perform stand pivot transfer bed to chair with min (A) and setup (A). Pt is highly motivated to D/C home and not to post acute rehab facility. Pt refused ambulating today due to pain. Will need to be mod I prior to D/C home. Will cont  To follow per POC.   Follow Up Recommendations  Home health PT;Supervision - Intermittent     Does the patient have the potential to tolerate intense rehabilitation     Barriers to Discharge        Equipment Recommendations  None recommended by PT    Recommendations for Other Services    Frequency Min 4X/week   Progress towards PT Goals Progress towards PT goals: Progressing toward goals  Plan Current plan remains appropriate    Precautions / Restrictions Precautions Precautions: Fall Required Braces or Orthoses: Other Brace/Splint Other Brace/Splint: Rt LE prosthesis   Restrictions Weight Bearing Restrictions: No   Pertinent Vitals/Pain Did not rate pain but c/o pain. patient repositioned for comfort     Mobility  Bed Mobility Bed Mobility: Supine to Sit;Sitting - Scoot to Edge of Bed Supine to Sit: 5: Supervision;HOB elevated;With rails Sitting - Scoot to Edge of Bed: 5: Supervision Details for Bed Mobility Assistance: cues for hand placement and sequencing; pt relies heavily on handrails  Transfers Transfers: Sit to Stand;Stand to Sit;Stand Pivot Transfers Sit to Stand: 4: Min assist;From bed Stand to Sit: 4: Min assist;To chair/3-in-1 Stand Pivot Transfers: 4:  Min assist;With armrests;From elevated surface Details for Transfer Assistance: (A) to maintain balance with transfers; cues for hand placement and sequencing  Ambulation/Gait Ambulation/Gait Assistance: Not tested (comment) (pt refused ) Stairs: No Wheelchair Mobility Wheelchair Mobility: No         PT Diagnosis:    PT Problem List:   PT Treatment Interventions:     PT Goals (current goals can now be found in the care plan section) Acute Rehab PT Goals Patient Stated Goal: to go home and not rehab again  PT Goal Formulation: With patient Time For Goal Achievement: 08/23/13 Potential to Achieve Goals: Good  Visit Information  Last PT Received On: 08/18/13 Assistance Needed: +1 History of Present Illness: Patient is a 52 yo female admitted with 9 day history of LLE edema and reddness following a fall.  Patient dx with cellulitis.  Patient with h/o DM with neuropathy, Rt BKA with prosthesis, HTN, PVD, CHF, anxiety.    Subjective Data  Subjective: pt lying supine; agreeable to transfer to chair  Patient Stated Goal: to go home and not rehab again    Cognition  Cognition Arousal/Alertness: Awake/alert Behavior During Therapy: WFL for tasks assessed/performed Overall Cognitive Status: Within Functional Limits for tasks assessed    Balance  Balance Balance Assessed: Yes Static Sitting Balance Static Sitting - Balance Support: No upper extremity supported;Feet unsupported Static Sitting - Level of Assistance: 5: Stand by assistance Static Sitting - Comment/# of Minutes: pt toelrated sitting EOB ~4 min to resolve dizziness prior to transfer  End of Session  PT - End of Session Equipment Utilized During Treatment: Gait belt Activity Tolerance: Patient limited by pain Patient left: in chair;with call bell/phone within reach Nurse Communication: Mobility status;Precautions   GP     Donell Sievert, [place] [phone] 08/18/2013, 1:41 PM

## 2013-08-19 DIAGNOSIS — R7309 Other abnormal glucose: Secondary | ICD-10-CM

## 2013-08-19 LAB — VANCOMYCIN, RANDOM: Vancomycin Rm: 20.2 ug/mL

## 2013-08-19 LAB — BLOOD GAS, ARTERIAL
Bicarbonate: 22.4 mEq/L (ref 20.0–24.0)
O2 Saturation: 92.6 %
Patient temperature: 98.6
TCO2: 23.9 mmol/L (ref 0–100)
pH, Arterial: 7.27 — ABNORMAL LOW (ref 7.350–7.450)

## 2013-08-19 LAB — CBC
HCT: 27.4 % — ABNORMAL LOW (ref 36.0–46.0)
Hemoglobin: 9.3 g/dL — ABNORMAL LOW (ref 12.0–15.0)
MCHC: 33.9 g/dL (ref 30.0–36.0)
MCV: 82.5 fL (ref 78.0–100.0)
Platelets: 261 10*3/uL (ref 150–400)
RBC: 3.32 MIL/uL — ABNORMAL LOW (ref 3.87–5.11)
WBC: 12.8 10*3/uL — ABNORMAL HIGH (ref 4.0–10.5)

## 2013-08-19 LAB — URINE MICROSCOPIC-ADD ON

## 2013-08-19 LAB — URINALYSIS, ROUTINE W REFLEX MICROSCOPIC
Glucose, UA: 100 mg/dL — AB
Ketones, ur: NEGATIVE mg/dL
Nitrite: NEGATIVE
Protein, ur: 100 mg/dL — AB
Specific Gravity, Urine: 1.02 (ref 1.005–1.030)
Urobilinogen, UA: 0.2 mg/dL (ref 0.0–1.0)
pH: 5 (ref 5.0–8.0)

## 2013-08-19 LAB — GLUCOSE, CAPILLARY
Glucose-Capillary: 225 mg/dL — ABNORMAL HIGH (ref 70–99)
Glucose-Capillary: 276 mg/dL — ABNORMAL HIGH (ref 70–99)
Glucose-Capillary: 224 mg/dL — ABNORMAL HIGH (ref 70–99)

## 2013-08-19 LAB — SODIUM, URINE, RANDOM: Sodium, Ur: 28 mEq/L

## 2013-08-19 LAB — BASIC METABOLIC PANEL
BUN: 53 mg/dL — ABNORMAL HIGH (ref 6–23)
CO2: 21 mEq/L (ref 19–32)
Calcium: 9 mg/dL (ref 8.4–10.5)
Chloride: 90 mEq/L — ABNORMAL LOW (ref 96–112)
Creatinine, Ser: 6.6 mg/dL — ABNORMAL HIGH (ref 0.50–1.10)
GFR calc Af Amer: 8 mL/min — ABNORMAL LOW (ref >90)
GFR calc non Af Amer: 7 mL/min — ABNORMAL LOW (ref >90)
Glucose, Bld: 222 mg/dL — ABNORMAL HIGH (ref 70–99)
Potassium: 5.4 mEq/L — ABNORMAL HIGH (ref 3.7–5.3)
Sodium: 130 mEq/L — ABNORMAL LOW (ref 137–147)

## 2013-08-19 LAB — CK: Total CK: 2875 U/L — ABNORMAL HIGH (ref 7–177)

## 2013-08-19 MED ORDER — INSULIN ASPART 100 UNIT/ML ~~LOC~~ SOLN
20.0000 [IU] | Freq: Three times a day (TID) | SUBCUTANEOUS | Status: DC
  Administered 2013-08-20 – 2013-08-28 (×20): 20 [IU] via SUBCUTANEOUS

## 2013-08-19 NOTE — Progress Notes (Signed)
[facility]                                                                                Patient Demographics  [name], is a 52 y.o. female, DOB - [birth year], ZOX:[id number]  Admit date - 08/15/2013   Admitting Physician Ron Parker, MD  Outpatient Primary MD for the patient is TALBOT, Dineen Kid, MD  LOS - 4   Chief Complaint  Patient presents with  . Leg Swelling        Assessment & Plan  Principal Problem:   Cellulitis of left leg Active Problems:   Diabetes mellitus type 2, uncontrolled, with complications   Hyponatremia   HTN (hypertension)   Leukocytosis   ARF (acute renal failure)   Chronic diastolic heart failure   Sepsis   Left leg cellulitis  Sepsis secondary to Cellulitis of left leg -Improving -Continue ceftriaxone and vancomycin -Blood cultures show no growth to date -CT scan does not show any abscess at this time -Continue pain control  ARF (acute renal failure)/ATN -Likely secondary to dehydration -Will gently hydrate with IV fluids -Pending urine electrolytes -Holding nephrotoxic agents, Lasix, HCTZ and lisinopril -Creatinine continues to trend up, Cr 6.6 -Nephrology following -Renal US: The Mild increased parenchymal echotexture in both kidneys suggesting medical renal disease. No evidence of hydronephrosis. No significant atrophy.  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 -Consulted Dr. Berneice Heinrich, urologist  Diabetes mellitus type 2, uncontrolled, with complications:  -Continue home insulin regimen of Levemir 140 units daily -Continue insulin sliding scale with CBG monitoring -HbA1c 14.7 -Diabetes coordinator following -Continue gabapentin, renally adjusted  Hyponatremia -Improving, Na 130,Likely secondary to dehydration with acute renal injury. -Will give gentle IV fluid rehydration due to history of diastolic CHF -Will continue to monitor. -pending  urinalysis and urine electrolytes  Hyperkalemia -Likely secondary to acute renal failure  HTN (hypertension)  -Currently controlled.  -Holding Lasix, HCTZ, and lisinopril due to the acute kidney injury. -Will add on IV medications if needed.  Chronic diastolic heart failure  -Currently compensated, not in exacerbation. -Will continue to monitor her her daily weights, intake and output, fluid restriction.  Depression -Continue fluoxetine  Hypothyroidism -Continue Synthroid  COPD -Compensated, continued spiriva and breo  Code Status: Full  Family Communication: None at bedside.  Disposition Plan: Admitted.  Will discharge once stable.     Procedures  None  Consults   Nephrology  DVT Prophylaxis  Heparin   Lab Results  Component Value Date   PLT 261 08/19/2013    Medications  Scheduled Meds: . aspirin EC  81 mg Oral Daily  . atorvastatin  80 mg Oral Daily  . cefTRIAXone (ROCEPHIN)  IV  1 g Intravenous Q24H  . feeding supplement (GLUCERNA SHAKE)  237 mL Oral Daily  . FLUoxetine  20 mg Oral BID  . Fluticasone Furoate-Vilanterol  1 puff Inhalation Daily  . gabapentin  100 mg Oral BID  . heparin  5,000 Units Subcutaneous Q8H  . insulin aspart  0-15 Units Subcutaneous TID WC  . insulin aspart  0-5 Units Subcutaneous QHS  . insulin aspart  4 Units Subcutaneous TID WC  .  insulin detemir  140 Units Subcutaneous Daily  . levothyroxine  25 mcg Oral QAC breakfast  . metoCLOPramide  5 mg Oral TID  . multivitamin with minerals  1 tablet Oral Daily  . oxybutynin  5 mg Oral QAC breakfast  . pantoprazole  40 mg Oral Daily  . tiotropium  18 mcg Inhalation Daily   Continuous Infusions: . sodium chloride 50 mL/hr at 08/18/13 2033   PRN Meds:.acetaminophen, acetaminophen, hydrALAZINE, morphine injection, ondansetron (ZOFRAN) IV, ondansetron, oxyCODONE, oxyCODONE-acetaminophen, polyethylene glycol  Antibiotics    Anti-infectives   Start     Dose/Rate Route Frequency  Ordered Stop   08/19/13 1800  vancomycin (VANCOCIN) 1,500 mg in sodium chloride 0.9 % 500 mL IVPB  Status:  Discontinued     1,500 mg 250 mL/hr over 120 Minutes Intravenous Every 48 hours 08/17/13 1800 08/17/13 1804   08/17/13 1900  vancomycin (VANCOCIN) 1,500 mg in sodium chloride 0.9 % 500 mL IVPB  Status:  Discontinued     1,500 mg 250 mL/hr over 120 Minutes Intravenous Every 48 hours 08/17/13 1804 08/18/13 1129   08/17/13 1830  vancomycin (VANCOCIN) 2,000 mg in sodium chloride 0.9 % 500 mL IVPB  Status:  Discontinued     2,000 mg 250 mL/hr over 120 Minutes Intravenous  Once 08/17/13 1759 08/17/13 1802   08/16/13 1200  vancomycin (VANCOCIN) IVPB 1000 mg/200 mL premix  Status:  Discontinued     1,000 mg 200 mL/hr over 60 Minutes Intravenous Every 12 hours 08/16/13 0344 08/16/13 0819   08/16/13 0900  cefTRIAXone (ROCEPHIN) 1 g in dextrose 5 % 50 mL IVPB     1 g 100 mL/hr over 30 Minutes Intravenous Every 24 hours 08/16/13 0819     08/16/13 0300  ertapenem (INVANZ) 1 g in sodium chloride 0.9 % 50 mL IVPB     1 g 100 mL/hr over 30 Minutes Intravenous  Once 08/16/13 0236 08/16/13 0349   08/15/13 2315  vancomycin (VANCOCIN) IVPB 1000 mg/200 mL premix     1,000 mg 200 mL/hr over 60 Minutes Intravenous  Once 08/15/13 2302 08/16/13 0316       Time Spent in minutes   25 minutes   Malia Corsi D.O. on 08/19/2013 at 1:47 PM  Between 7am to 7pm - Pager - [phone]  After 7pm go to [email or web address] - password [email or web address]  And look for the night coverage person covering for me after hours  [facility] Office  [phone]    Subjective:   [name] seen and examined today.  Patient continues to have pain in her left leg, but states it has improved.  She feels that she is dry.  Objective:   Filed Vitals:   08/18/13 2036 08/19/13 0425 08/19/13 0831 08/19/13 1154  BP: 145/65 137/68 162/60 184/70  Pulse: 70 74 87 88  Temp: 98.1 F (36.7 C) 98.3 F (36.8 C) 98.7 F  (37.1 C) 98.6 F (37 C)  TempSrc: Axillary Oral Oral Oral  Resp: 18 18 22 20   Height:      Weight: 102.059 kg (225 lb)     SpO2: 93% 95% 96% 96%    Wt Readings from Last 3 Encounters:  08/18/13 102.059 kg (225 lb)  05/05/13 100.426 kg (221 lb 6.4 oz)  04/22/13 99.292 kg (218 lb 14.4 oz)     Intake/Output Summary (Last 24 hours) at 08/19/13 1347 Last data filed at 08/19/13 0900  Gross per 24 hour  Intake    840 ml  Output  0 ml  Net    840 ml    Exam  General: Well developed, well nourished, NAD, appears stated age  HEENT: NCAT, PERRLA, EOMI, Anicteic Sclera, mucous membranes dry.   Neck: Supple, no JVD, no masses  Cardiovascular: S1 S2 auscultated, no rubs, murmurs or gallops. Regular rate and rhythm.  Respiratory: Clear to auscultation bilaterally with equal chest rise  Abdomen: Soft, nontender, nondistended, + bowel sounds  Extremities: warm dry without cyanosis clubbing of LLE.  Cellulitis marked on the LLE, improving.  L thigh shows indurated area, warm to touch, tenderness to palpation.    Neuro: AAOx3, cranial nerves grossly intact.  Skin: Without rashes exudates or nodules, cellulitis as noted above.  Psych: Normal affect and demeanor with intact judgement and insight  Data Review   Micro Results Recent Results (from the past 240 hour(s))  MRSA PCR SCREENING     Status: None   Collection Time    08/16/13  4:22 AM      Result Value Range Status   MRSA by PCR NEGATIVE  NEGATIVE Final   Comment:            The GeneXpert MRSA Assay (FDA     approved for NASAL specimens     only), is one component of a     comprehensive MRSA colonization     surveillance program. It is not     intended to diagnose MRSA     infection nor to guide or     monitor treatment for     MRSA infections.  CULTURE, BLOOD (ROUTINE X 2)     Status: None   Collection Time    08/16/13 11:00 AM      Result Value Range Status   Specimen Description BLOOD RIGHT ARM   Final    Special Requests BOTTLES DRAWN AEROBIC AND ANAEROBIC 10CC   Final   Culture  Setup Time     Final   Value: 08/16/2013 18:50     Performed at [facility]   Culture     Final   Value:        BLOOD CULTURE RECEIVED NO GROWTH TO DATE CULTURE WILL BE HELD FOR 5 DAYS BEFORE ISSUING A FINAL NEGATIVE REPORT     Performed at [facility]   Report Status PENDING   Incomplete  CULTURE, BLOOD (ROUTINE X 2)     Status: None   Collection Time    08/16/13 11:15 AM      Result Value Range Status   Specimen Description BLOOD RIGHT ARM   Final   Special Requests BOTTLES DRAWN AEROBIC AND ANAEROBIC 10CC   Final   Culture  Setup Time     Final   Value: 08/16/2013 18:50     Performed at [facility]   Culture     Final   Value:        BLOOD CULTURE RECEIVED NO GROWTH TO DATE CULTURE WILL BE HELD FOR 5 DAYS BEFORE ISSUING A FINAL NEGATIVE REPORT     Performed at [facility]   Report Status PENDING   Incomplete    Radiology Reports Ct Abdomen Pelvis Wo Contrast  08/16/2013   CLINICAL DATA:  Abdominal pain.  EXAM: CT ABDOMEN AND PELVIS WITHOUT CONTRAST  TECHNIQUE: Multidetector CT imaging of the abdomen and pelvis was performed following the standard protocol without intravenous contrast.  COMPARISON:  CT of the left femur performed 08/15/2013, and CT of the pelvis performed 01/23/2013  FINDINGS: The visualized lung bases are clear.  The liver and spleen are unremarkable in appearance. The gallbladder is within normal limits. The pancreas and left adrenal gland are unremarkable. A 1.2 cm right adrenal lesion is noted, too high in attenuation to characterize as an adrenal adenoma on this study.  Residual contrast is seen within the renal calyces. Nonspecific perinephric stranding is noted bilaterally. A small 1.0 cm hypodensity at the lower pole of the right kidney may reflect a small cyst. There is no evidence of hydronephrosis. No definite obstructing ureteral stones are seen.   No free fluid is identified. The small bowel is unremarkable in appearance. The stomach is within normal limits. No acute vascular abnormalities are seen.  The appendix is normal in caliber and contains air, without evidence for appendicitis. The colon is unremarkable in appearance.  The bladder is largely filled with contrast. The previously noted debris and air in the bladder are displaced by contrast. The amount of debris seems decreased from the recent prior study; some of the debris may have been passed in the urine. Scattered tiny foci of air are again seen outside the bladder wall; the appearance raises suspicion for emphysematous cystitis. The uterus is unremarkable in appearance. The ovaries are relatively symmetric; no suspicious adnexal masses are seen. Enlarged left inguinal nodes are again seen.  No acute osseous abnormalities are identified.  IMPRESSION: 1. Previously noted debris and air in the bladder are displaced by contrast. The amount of debris appears decreased from the recent prior study; some of the debris may have been passed in the urine. Scattered tiny foci of air again seen outside the bladder wall; the appearance raises suspicion for emphysematous cystitis. 2. Small right renal cyst. 3. 1.2 cm right adrenal lesion, too high in attenuation to characterize as an adrenal adenoma on this study. Adrenal protocol CT or MRI could be considered for further evaluation, when and as deemed clinically appropriate. 4. Enlarged left inguinal nodes again seen.   Electronically Signed   By: Roanna Raider M.D.   On: 08/16/2013 03:51   Ct Femur Left W Contrast  08/16/2013   CLINICAL DATA:  Left thigh swelling and erythema, with left lateral thigh pain just above the knee.  EXAM: CT OF THE LEFT FEMUR WITH CONTRAST  TECHNIQUE: Multidetector CT imaging of the left femur was performed according to the standard protocol following intravenous contrast administration. Multiplanar CT image reconstructions  were also generated.  CONTRAST:  OMNIPAQUE IOHEXOL 300 MG/ML  SOLN  COMPARISON:  Left knee radiographs performed 01/16/2006, and CT of the pelvis performed 01/23/2013  FINDINGS: There is diffuse soft tissue inflammation and edema along the lateral aspect of the left thigh, extending inferiorly below the left knee. Associated significant skin thickening is seen. Findings are most compatible with cellulitis. There is no evidence of underlying abscess at this time. There is minimal associated soft tissue edema underlying the fascia, though the underlying musculature is generally intact.  There is no evidence of vascular compromise. Very small knee joint effusion is noted. The knee joint is otherwise grossly unremarkable in appearance. Enlarged left inguinal nodes are seen, measuring up to 1.6 cm in short axis. Scattered prominent collateral vessels are noted along the visualized anterior abdominal wall.  The left hip joint is unremarkable in appearance. No hip joint effusion is identified. The visualized portions of the uterus are grossly unremarkable. Note is made of a large amount of debris and solid material filling the bladder, with trace air  noted outside of the bladder. This appears new from the CT of the pelvis performed 01/23/2013.  No acute osseous abnormalities are identified.  IMPRESSION: 1. Diffuse soft tissue inflammation and edema along the lateral aspect of the left thigh, extending inferiorly below the left knee, with associated skin thickening, compatible with cellulitis. No evidence of underlying abscess at this time. Minimal associated soft tissue edema seen underlying the fascia, though the underlying musculature is grossly intact. 2. There is small knee joint effusion noted. 3. Left inguinal lymphadenopathy seen, with nodes measuring up to 1.6 cm in short axis. 4. Prominent collateral vessels noted along the anterior abdominal wall. 5. Large amount of debris and solid material filling the  bladder, with trace air noted outside the bladder. This is new from the CT of the pelvis performed 01/23/2013. This is a very unusual appearance, and could conceivably reflect a colovesical fistula, or possibly severe emphysematous cystitis. Would correlate for associated symptoms.  These results were called by telephone at the time of interpretation on 08/16/2013 at 1:27 AM to Dr. Eber Hong , who verbally acknowledged these results.   Electronically Signed   By: Roanna Raider M.D.   On: 08/16/2013 01:29    CBC  Recent Labs Lab 08/15/13 2256 08/16/13 0541 08/17/13 1030 08/18/13 0642 08/19/13 0735  WBC 15.0* 13.1* 14.2* 15.1* 12.8*  HGB 12.2 11.1* 11.5* 10.7* 9.3*  HCT 34.9* 32.2* 34.2* 31.8* 27.4*  PLT 243 230 230 286 261  MCV 82.9 83.6 84.9 82.8 82.5  MCH 29.0 28.8 28.5 27.9 28.0  MCHC 35.0 34.5 33.6 33.6 33.9  RDW 13.3 13.4 13.7 13.9 14.1  LYMPHSABS 2.0  --   --   --   --   MONOABS 1.2*  --   --   --   --   EOSABS 0.0  --   --   --   --   BASOSABS 0.0  --   --   --   --     Chemistries   Recent Labs Lab 08/15/13 2256 08/16/13 0541 08/17/13 1030 08/18/13 0642 08/19/13 0735  NA 125* 126* 128* 127* 130*  K 3.9 3.6 3.9 5.4* 5.4*  CL 86* 87* 90* 89* 90*  CO2 29 27 22 19 21   GLUCOSE 379* 387* 237* 343* 222*  BUN 23 24* 32* 43* 53*  CREATININE 1.50* 1.65* 3.38* 5.62* 6.60*  CALCIUM 8.6 8.3* 8.8 9.5 9.0  AST 8  --  12  --   --   ALT 9  --  9  --   --   ALKPHOS 90  --  127*  --   --   BILITOT 0.4  --  0.2*  --   --    ------------------------------------------------------------------------------------------------------------------ estimated creatinine clearance is 11.8 ml/min (by C-G formula based on Cr of 6.6). ------------------------------------------------------------------------------------------------------------------ No results found for this basename: HGBA1C,  in the last 72  hours ------------------------------------------------------------------------------------------------------------------ No results found for this basename: CHOL, HDL, LDLCALC, TRIG, CHOLHDL, LDLDIRECT,  in the last 72 hours ------------------------------------------------------------------------------------------------------------------  Recent Labs  08/18/13 1300  TSH 4.222   ------------------------------------------------------------------------------------------------------------------ No results found for this basename: VITAMINB12, FOLATE, FERRITIN, TIBC, IRON, RETICCTPCT,  in the last 72 hours  Coagulation profile No results found for this basename: INR, PROTIME,  in the last 168 hours  No results found for this basename: DDIMER,  in the last 72 hours  Cardiac Enzymes No results found for this basename: CK, CKMB, TROPONINI, MYOGLOBIN,  in the last 168 hours ------------------------------------------------------------------------------------------------------------------  No components found with this basename: POCBNP,

## 2013-08-19 NOTE — Progress Notes (Signed)
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 with hx of chronic urinary incontinence  Plan:  1 Check urine analysis(pending), ultrasound(pending), CK & ASO titer and complements  2 Rec Urology input regarding bladder  Subjective: Interval History: CK elevated at 2875 4 days post admission  Objective: Vital signs in last 24 hours: Temp:  [98.1 F (36.7 C)-99.4 F (37.4 C)] 98.6 F (37 C) (12/30 1154) Pulse Rate:  [66-109] 88 (12/30 1154) Resp:  [18-22] 20 (12/30 1154) BP: (136-184)/(60-73) 184/70 mmHg (12/30 1154) SpO2:  [92 %-96 %] 96 % (12/30 1154) Weight:  [102.059 kg (225 lb)] 102.059 kg (225 lb) (12/29 2036) Weight change: 0 kg (0 lb)  Intake/Output from previous day: 12/29 0701 - 12/30 0700 In: 870 [P.O.:870] Out: -  Intake/Output this shift: Total I/O In: 240 [P.O.:240] Out: -   Lab Results:  Recent Labs  08/18/13 0642 08/19/13 0735  WBC 15.1* 12.8*  HGB 10.7* 9.3*  HCT 31.8* 27.4*  PLT 286 261   BMET:  Recent Labs  08/18/13 0642 08/19/13 0735  NA 127* 130*  K 5.4* 5.4*  CL 89* 90*  CO2 19 21  GLUCOSE 343* 222*  BUN 43* 53*  CREATININE 5.62* 6.60*  CALCIUM 9.5 9.0   No results found for this basename: PTH,  in the last 72 hours Iron Studies: No results found for this basename: IRON, TIBC, TRANSFERRIN, FERRITIN,  in the last 72 hours Studies/Results: US Renal  08/19/2013   CLINICAL DATA:  Increasing creatinine.  EXAM: RENAL/URINARY TRACT ULTRASOUND COMPLETE  COMPARISON:  CT abdomen and pelvis 08/16/2013  FINDINGS: Right Kidney:  Length: 13.1 cm. Normal parenchymal thickness with mild increased cortical echotexture. Changes suggest medical renal disease. No hydronephrosis or solid mass.  Left Kidney:  Length: 12.4 cm. Normal  parenchymal thickness with mild increased cortical echotexture. Changes suggest medical renal disease. No hydronephrosis or solid mass.  Bladder:  Bladder is decompressed and not visualized.  IMPRESSION: The Mild increased parenchymal echotexture in both kidneys suggesting medical renal disease. No evidence of hydronephrosis. No significant atrophy.   Electronically Signed   By: Burman Nieves M.D.   On: 08/19/2013 02:01   Scheduled: . aspirin EC  81 mg Oral Daily  . atorvastatin  80 mg Oral Daily  . cefTRIAXone (ROCEPHIN)  IV  1 g Intravenous Q24H  . feeding supplement (GLUCERNA SHAKE)  237 mL Oral Daily  . FLUoxetine  20 mg Oral BID  . Fluticasone Furoate-Vilanterol  1 puff Inhalation Daily  . gabapentin  100 mg Oral BID  . heparin  5,000 Units Subcutaneous Q8H  . insulin aspart  0-15 Units Subcutaneous TID WC  . insulin aspart  0-5 Units Subcutaneous QHS  . insulin aspart  20 Units Subcutaneous TID WC  . insulin aspart  4 Units Subcutaneous TID WC  . insulin detemir  140 Units Subcutaneous Daily  . levothyroxine  25 mcg Oral QAC breakfast  . metoCLOPramide  5 mg Oral TID  . multivitamin with minerals  1 tablet Oral Daily  . oxybutynin  5 mg Oral QAC breakfast  . pantoprazole  40 mg Oral Daily  . tiotropium  18 mcg Inhalation Daily     LOS: 4 days   Sole Lengacher C 08/19/2013,2:07 PM

## 2013-08-19 NOTE — Progress Notes (Signed)
Inpatient Diabetes Program Recommendations  AACE/ADA: New Consensus Statement on Inpatient Glycemic Control (2013)  Target Ranges:  Prepandial:   less than 140 mg/dL      Peak postprandial:   less than 180 mg/dL (1-2 hours)      Critically ill patients:  140 - 180 mg/dL   Reason for Visit: MD referral regarding better management of diabetes  Results for [name], [name] (MRN [id number]) as of 08/19/2013 14:46  Ref. Range 08/18/2013 07:48 08/18/2013 11:44 08/18/2013 16:18 08/18/2013 20:40 08/19/2013 07:42 08/19/2013 11:53  Glucose-Capillary Latest Range: 70-99 mg/dL 409 (H) 811 (H) 914 (H) 246 (H) 225 (H) 276 (H)   Note: CBG's have improved from 300's yesterday to 200's today.  Has received Novolog 4 units meal coverage both at breakfast and lunch in anticipation of her eating, but ate only 20% at breakfast and 0 at lunch.  Patient is very lethargic and unable to talk to me.  Discussed with her nurse and MD is aware.  (Patient was lethargic like this yesterday as well.)  Again, unable to fully assess patient today.  Will try again tomorrow.  While patient is unable to eat, may benefit from changing CBG's to every 4 hours and holding the meal coverage.  Renal function is worsening-- may or may not be advisable to increase intensity of correction from moderate to resistant.  Thank you.  Lanetra Hartley S. Elsie Lincoln, RN, CNS, CDE Inpatient Diabetes Program, team pager [phone]

## 2013-08-19 NOTE — Progress Notes (Signed)
ANTIBIOTIC CONSULT NOTE - INITIAL  Pharmacy Consult for Vancomycin Indication: Cellulitis   Allergies  Allergen Reactions  . Gadolinium      Code: VOM, Desc: Pt began vomiting immed post infusion of multihance, Onset Date: [id number]   . Ivp Dye [Iodinated Diagnostic Agents] Nausea And Vomiting  . Metformin Other (See Comments)     diarrhea  . Penicillins Hives    Patient Measurements: Height: 5\' 5"  (165.1 cm) Weight: 225 lb (102.059 kg) IBW/kg (Calculated) : 57 Adjusted Body Weight: n/a  Vital Signs: Temp: 98.7 F (37.1 C) (12/30 0831) Temp src: Oral (12/30 0831) BP: 162/60 mmHg (12/30 0831) Pulse Rate: 87 (12/30 0831) Intake/Output from previous day: 12/29 0701 - 12/30 0700 In: 870 [P.O.:870] Out: -  Intake/Output from this shift: Total I/O In: 240 [P.O.:240] Out: -   Labs:  Recent Labs  08/17/13 1030 08/18/13 0642 08/19/13 0735  WBC 14.2* 15.1* 12.8*  HGB 11.5* 10.7* 9.3*  PLT 230 286 261  CREATININE 3.38* 5.62* 6.60*   Estimated Creatinine Clearance: 11.8 ml/min (by C-G formula based on Cr of 6.6).  Recent Labs  08/19/13 0735  VANCORANDOM 20.2     Microbiology: Recent Results (from the past 720 hour(s))  MRSA PCR SCREENING     Status: None   Collection Time    08/16/13  4:22 AM      Result Value Range Status   MRSA by PCR NEGATIVE  NEGATIVE Final   Comment:            The GeneXpert MRSA Assay (FDA     approved for NASAL specimens     only), is one component of a     comprehensive MRSA colonization     surveillance program. It is not     intended to diagnose MRSA     infection nor to guide or     monitor treatment for     MRSA infections.  CULTURE, BLOOD (ROUTINE X 2)     Status: None   Collection Time    08/16/13 11:00 AM      Result Value Range Status   Specimen Description BLOOD RIGHT ARM   Final   Special Requests BOTTLES DRAWN AEROBIC AND ANAEROBIC 10CC   Final   Culture  Setup Time     Final   Value: 08/16/2013 18:50      Performed at [facility]   Culture     Final   Value:        BLOOD CULTURE RECEIVED NO GROWTH TO DATE CULTURE WILL BE HELD FOR 5 DAYS BEFORE ISSUING A FINAL NEGATIVE REPORT     Performed at [facility]   Report Status PENDING   Incomplete  CULTURE, BLOOD (ROUTINE X 2)     Status: None   Collection Time    08/16/13 11:15 AM      Result Value Range Status   Specimen Description BLOOD RIGHT ARM   Final   Special Requests BOTTLES DRAWN AEROBIC AND ANAEROBIC 10CC   Final   Culture  Setup Time     Final   Value: 08/16/2013 18:50     Performed at [facility]   Culture     Final   Value:        BLOOD CULTURE RECEIVED NO GROWTH TO DATE CULTURE WILL BE HELD FOR 5 DAYS BEFORE ISSUING A FINAL NEGATIVE REPORT     Performed at [facility]   Report Status PENDING   Incomplete  Medical History: Past Medical History  Diagnosis Date  . Diabetic neuropathy   . Cellulitis   . Thyroid disease   . Hyperlipidemia   . Heart murmur   . Neuromuscular disorder   . Concussion as a teenager    slight  . Diarrhea     pt takes Reglan tid  . Hypothyroidism     takes Synthroid daily  . Depression     takes CYmbalta daily  . Peripheral edema     takes Lasix daily  . MRSA (methicillin resistant staph aureus) culture positive     hx of 2012  . Staph infection 2007  . Complication of anesthesia 01/2013    "didn't know where I was; who I was; talking out the top of my head" (05-07-13)  . Hypertension   . Peripheral vascular disease   . CHF (congestive heart failure)   . Pneumonia 1980's    "hospitalized w/it" (May 07, 2013)  . Chronic bronchitis   . Orthopnea     "just one; yesterday morning" (07-May-2013)  . Type II diabetes mellitus     Novolog,Levemir,and Lovaza daily  . GERD (gastroesophageal reflux disease)   . Daily headache   . Rheumatoid arthritis     "both knees" (May 07, 2013)  . Anxiety   . Kidney stone     "passed on before" (May 07, 2013)    Medications:   Prescriptions prior to admission  Medication Sig Dispense Refill  . albuterol (PROVENTIL HFA;VENTOLIN HFA) 108 (90 BASE) MCG/ACT inhaler Inhale 2 puffs into the lungs every 6 (six) hours as needed for shortness of breath.  1 Inhaler  2  . aspirin EC 81 MG tablet Take 81 mg by mouth daily.      Marland Kitchen atorvastatin (LIPITOR) 80 MG tablet Take 80 mg by mouth daily.      . clindamycin (CLEOCIN) 300 MG capsule Take 300 mg by mouth 3 (three) times daily. Started on 08-11-13 for 10 days.      Marland Kitchen FLUoxetine (PROZAC) 20 MG tablet Take 20 mg by mouth 2 (two) times daily.       . Fluticasone Furoate-Vilanterol (BREO ELLIPTA) 100-25 MCG/INH AEPB Inhale 1 puff into the lungs daily.      . furosemide (LASIX) 40 MG tablet Take 1 tablet (40 mg total) by mouth daily.  30 tablet  0  . gabapentin (NEURONTIN) 800 MG tablet Take 800 mg by mouth 3 (three) times daily.       . hydrochlorothiazide (MICROZIDE) 12.5 MG capsule Take 12.5 mg by mouth daily.      . insulin aspart (NOVOLOG) 100 UNIT/ML injection Inject 36-48 Units into the skin 3 (three) times daily with meals. *per sliding scale      . insulin detemir (LEVEMIR) 100 UNIT/ML injection Inject 140 Units into the skin every morning. *uses 70 units on each side      . levothyroxine (SYNTHROID, LEVOTHROID) 25 MCG tablet Take 25 mcg by mouth every morning.       Marland Kitchen lisinopril (PRINIVIL,ZESTRIL) 5 MG tablet Take 5 mg by mouth daily.      . metoCLOPramide (REGLAN) 5 MG tablet Take 5 mg by mouth 3 (three) times daily.       Marland Kitchen nystatin (MYCOSTATIN/NYSTOP) 100000 UNIT/GM POWD Apply topically.      Marland Kitchen omeprazole (PRILOSEC) 20 MG capsule Take 20 mg by mouth daily.      Marland Kitchen oxybutynin (DITROPAN) 5 MG tablet Take 5 mg by mouth every morning.        Marland Kitchen  oxyCODONE-acetaminophen (PERCOCET) 10-325 MG per tablet Take 1 tablet by mouth every 4 (four) hours as needed for pain.      Marland Kitchen tiotropium (SPIRIVA) 18 MCG inhalation capsule Place 18 mcg into inhaler and inhale daily.        Assessment: 39 YOF with sepsis secondary to cellulits of left leg. Currently on IV ceftriaxone and IV vancomycin. CT scan does not show any abscess at this time. Blood Cx with no growth to date, afeb, wbc trending down. Renal function continuing to decline, Vanc random level returned supratherapeutic, will continue to hold dose and recheck level in AM.   Goal of Therapy:  Vancomycin trough level 10-15 mcg/ml  Plan:  1) Continue to Hold Vanc, will redose when Vanc <15 2) Continue  Ceftriaxone 1 gm IV Q 24 hours  3) Recheck random vanc level 12/31 @0500   Saliah Crisp M. Allena Katz, PharmD Clinical Pharmacist- Resident Pager: [phone] Pharmacy: [phone] 08/19/2013 10:12 AM

## 2013-08-19 NOTE — Progress Notes (Signed)
Physical Therapy Treatment Patient Details Name: SEYNABOU FULTS MRN: [id number] DOB: [birth year] Today's Date: 08/19/2013 Time: 4098-1191 PT Time Calculation (min): 17 min  PT Assessment / Plan / Recommendation  History of Present Illness Patient is a 52 yo female admitted with 9 day history of LLE edema and reddness following a fall.  Patient dx with cellulitis.  Patient with h/o DM with neuropathy, Rt BKA with prosthesis, HTN, PVD, CHF, anxiety.   PT Comments   Pt very limited today with mobility secondary to decreased arousal and lethargy. Required max (A) to perform bed mobility. Was able to open eyes and answer questions with max multimodal cues. Pt was disoriented today and more confused vs yesterday's therapy session. RN and MD present at end of session and notified. D/C disposition updated at this time due to decreased mobility and due to decreased caregiver support (pt lives alone). Will cont to follow per POC.   Follow Up Recommendations  SNF;Supervision/Assistance - 24 hour     Does the patient have the potential to tolerate intense rehabilitation     Barriers to Discharge        Equipment Recommendations  None recommended by PT    Recommendations for Other Services    Frequency Min 3X/week   Progress towards PT Goals Progress towards PT goals: Not progressing toward goals - comment (limited today due to AMS and decr arousal level)  Plan Discharge plan needs to be updated;Frequency needs to be updated    Precautions / Restrictions Precautions Precautions: Fall Required Braces or Orthoses: Other Brace/Splint Other Brace/Splint: Rt LE prosthesis   Restrictions Weight Bearing Restrictions: No   Pertinent Vitals/Pain Yells out in pain with activity; did not rate pain.     Mobility  Bed Mobility Bed Mobility: Supine to Sit;Sitting - Scoot to [place] of Bed;Sit to Supine;Scooting to [facility] Supine to Sit: 2: Max assist;HOB elevated;With rails Sitting - Scoot to Edge of Bed:  2: Max assist;With rail Sit to Supine: 2: Max assist;HOB flat Scooting to HOB: 2: Max assist Details for Bed Mobility Assistance: pt required max (A) to perform all bed mobility today due to lethargy. pt would open her eyes and be verbal for short periods of time; had increased difficulty following simple one step commands today; pt yelled out in pain when advancing LEs to EOB; max multimodal cues for hand placement and sequencing  Transfers Transfers: Not assessed (pt unsafe today due to lethargy ) Ambulation/Gait Ambulation/Gait Assistance: Not tested (comment) Stairs: No Wheelchair Mobility Wheelchair Mobility: No         PT Diagnosis:    PT Problem List:   PT Treatment Interventions:     PT Goals (current goals can now be found in the care plan section) Acute Rehab PT Goals Patient Stated Goal: none stated PT Goal Formulation: With patient Time For Goal Achievement: 08/23/13 Potential to Achieve Goals: Good  Visit Information  Last PT Received On: 08/19/13 Assistance Needed: +1 History of Present Illness: Patient is a 52 yo female admitted with 9 day history of LLE edema and reddness following a fall.  Patient dx with cellulitis.  Patient with h/o DM with neuropathy, Rt BKA with prosthesis, HTN, PVD, CHF, anxiety.    Subjective Data  Subjective: pt lying supine; very lethargic; requires max multimodal cues to arrouse  Patient Stated Goal: none stated   Cognition  Cognition Arousal/Alertness: Lethargic Behavior During Therapy: Flat affect Overall Cognitive Status: Impaired/Different from baseline Area of Impairment: Orientation;Memory;Awareness;Following commands Orientation Level: Disoriented  to;Situation;Place;Time Memory: Decreased short-term memory Following Commands: Follows one step commands inconsistently Awareness: Emergent General Comments: pt unaware that she was in the hosptial; did not recall being in the hospital yesterday. very lethargic; sitting upright  on EOB pt required increased (A) today to maintain upright position; seemed unaware that she was lethargic. pt required increased time and multimodal cues to perform one step commands     Balance  Balance Balance Assessed: Yes Static Sitting Balance Static Sitting - Balance Support: Bilateral upper extremity supported;Feet supported Static Sitting - Level of Assistance: 3: Mod assist;4: Min assist Static Sitting - Comment/# of Minutes: pt required (A) at all times today to maintain sitting position at EOB; pt continuously losing her balance posteriorly and to the Rt; pt unaware of balance deficits even with multimodal cues and became agitated with arrousal. pt unsafe to transer with single person (A) today due to letharg; tolerated sitting EOB ~5 min with (A)    End of Session PT - End of Session Activity Tolerance: Patient limited by lethargy Patient left: in bed;with bed alarm set;with call bell/phone within reach;with nursing/sitter in room;Other (comment) (MD present at end of session) Nurse Communication: Mobility status;Precautions;Other (comment) (arousal level today )   GP     Shelva Majestic [place], Kelii Chittum [place] [phone] 08/19/2013, 2:49 PM

## 2013-08-19 NOTE — Consult Note (Signed)
Reason for Consult: Emphysematous Cystitis, Acute Renal Failure  Referring Physician: Rosine Beat MD  [name] is an 52 y.o. female.   HPI:   1 - Emphysematous Cystitis - Poorly compliant diabetic with A1c 15 noted to have bubbles of gas within wall or urinary bladder by CT, bacteruria, c/w emphysematous cystitis during admission for cellulitis and SIRS. Has been on rocephin since 12/17, new UCX obtained today (12/30) and pending. NO h/o inflammatory bowel disease, diverticulosis/diverticulitis, or pelvic radiation.  No baseline voiding complaints.  2 - Acute Renal Failure - Pt with acute rise in Cr to 5.6 range from baseline 1.5 during admission. CT w/o hydro / stones / or other s/s obstruction. She has received IV contrast, IV ABX including Vancomycin, and suspect SIRS.  Today [name] is seen in consultation for above.   Past Medical History  Diagnosis Date  . Diabetic neuropathy   . Cellulitis   . Thyroid disease   . Hyperlipidemia   . Heart murmur   . Neuromuscular disorder   . Concussion as a teenager    slight  . Diarrhea     pt takes Reglan tid  . Hypothyroidism     takes Synthroid daily  . Depression     takes CYmbalta daily  . Peripheral edema     takes Lasix daily  . MRSA (methicillin resistant staph aureus) culture positive     hx of 2012  . Staph infection 2007  . Complication of anesthesia 01/2013    "didn't know where I was; who I was; talking out the top of my head" (29-Apr-2013)  . Hypertension   . Peripheral vascular disease   . CHF (congestive heart failure)   . Pneumonia 1980's    "hospitalized w/it" (29-Apr-2013)  . Chronic bronchitis   . Orthopnea     "just one; yesterday morning" (04/29/13)  . Type II diabetes mellitus     Novolog,Levemir,and Lovaza daily  . GERD (gastroesophageal reflux disease)   . Daily headache   . Rheumatoid arthritis     "both knees" (2013/04/29)  . Anxiety   . Kidney stone     "passed on before" (29-Apr-2013)    Past  Surgical History  Procedure Laterality Date  . Amputation    . Leg amputation below knee Right 2012  . Toe amputation Left ?2011     only 2 toes remaning.   . Tubal ligation  1990's  . Foot surgery Right 2012    "put pins in one year; amputated toes another OR" (April 29, 2013)  . Wrist surgery Right 1980's    "pinched nerve" (29-Apr-2013)  . Dilation and curettage of uterus  1980's  . Refractive surgery Bilateral 2014  . Incision and drainage abscess N/A 01/24/2013    Procedure: INCISION AND DRAINAGE ABSCESS;  Surgeon: Clovis Pu. Cornett, MD;  Location: [facility] OR;  Service: General;  Laterality: N/A;  . Inguinal hernia repair N/A 01/28/2013    Procedure: I&D Right Groin Wound;  Surgeon: Shelly Rubenstein, MD;  Location: [facility] OR;  Service: General;  Laterality: N/A;  . Ganglion cyst excision Left 1990's    "wrist" (2013/04/29)  . Inguinal hernia repair Right 01/2013  . Bladder surgery  1970's    "stretched the mouth of my bladder" (04-29-2013)    Family History  Problem Relation Age of Onset  . Hypertension Mother   . Hyperlipidemia Mother   . Hypertension Brother   . Hyperlipidemia Brother   . Cancer Maternal Aunt   . Diabetes  Paternal Aunt   . Diabetes Paternal Uncle   . Diabetes Paternal Grandmother   . Anesthesia problems Neg Hx   . Hypotension Neg Hx   . Malignant hyperthermia Neg Hx   . Pseudochol deficiency Neg Hx     Social History:  reports that she has been smoking Cigarettes.  She has a 16.5 pack-year smoking history. She has never used smokeless tobacco. She reports that she does not drink alcohol or use illicit drugs.  Allergies:  Allergies  Allergen Reactions  . Gadolinium      Code: VOM, Desc: Pt began vomiting immed post infusion of multihance, Onset Date: [id number]   . Ivp Dye [Iodinated Diagnostic Agents] Nausea And Vomiting  . Metformin Other (See Comments)     diarrhea  . Penicillins Hives    Medications: I have reviewed the patient's current medications.  Results  for orders placed during the hospital encounter of 08/15/13 (from the past 48 hour(s))  GLUCOSE, CAPILLARY     Status: Abnormal   Collection Time    08/17/13  9:04 PM      Result Value Range   Glucose-Capillary 157 (*) 70 - 99 mg/dL  CBC     Status: Abnormal   Collection Time    08/18/13  6:42 AM      Result Value Range   WBC 15.1 (*) 4.0 - 10.5 K/uL   RBC 3.84 (*) 3.87 - 5.11 MIL/uL   Hemoglobin 10.7 (*) 12.0 - 15.0 g/dL   HCT 91.4 (*) 78.2 - 95.6 %   MCV 82.8  78.0 - 100.0 fL   MCH 27.9  26.0 - 34.0 pg   MCHC 33.6  30.0 - 36.0 g/dL   RDW 21.3  08.6 - 57.8 %   Platelets 286  150 - 400 K/uL  BASIC METABOLIC PANEL     Status: Abnormal   Collection Time    08/18/13  6:42 AM      Result Value Range   Sodium 127 (*) 135 - 145 mEq/L   Potassium 5.4 (*) 3.5 - 5.1 mEq/L   Chloride 89 (*) 96 - 112 mEq/L   CO2 19  19 - 32 mEq/L   Glucose, Bld 343 (*) 70 - 99 mg/dL   BUN 43 (*) 6 - 23 mg/dL   Creatinine, Ser 4.69 (*) 0.50 - 1.10 mg/dL   Comment: DELTA CHECK NOTED   Calcium 9.5  8.4 - 10.5 mg/dL   GFR calc non Af Amer 8 (*) >90 mL/min   GFR calc Af Amer 9 (*) >90 mL/min   Comment: (NOTE)     The eGFR has been calculated using the CKD EPI equation.     This calculation has not been validated in all clinical situations.     eGFR's persistently <90 mL/min signify possible Chronic Kidney     Disease.  GLUCOSE, CAPILLARY     Status: Abnormal   Collection Time    08/18/13  7:48 AM      Result Value Range   Glucose-Capillary 351 (*) 70 - 99 mg/dL  GLUCOSE, CAPILLARY     Status: Abnormal   Collection Time    08/18/13 11:44 AM      Result Value Range   Glucose-Capillary 372 (*) 70 - 99 mg/dL  TSH     Status: None   Collection Time    08/18/13  1:00 PM      Result Value Range   TSH 4.222  0.350 - 4.500 uIU/mL   Comment:  Performed at [facility]  GLUCOSE, CAPILLARY     Status: Abnormal   Collection Time    08/18/13  4:18 PM      Result Value Range   Glucose-Capillary 305  (*) 70 - 99 mg/dL  GLUCOSE, CAPILLARY     Status: Abnormal   Collection Time    08/18/13  8:40 PM      Result Value Range   Glucose-Capillary 246 (*) 70 - 99 mg/dL  VANCOMYCIN, RANDOM     Status: None   Collection Time    08/19/13  7:35 AM      Result Value Range   Vancomycin Rm 20.2     Comment:            Random Vancomycin therapeutic     range is dependent on dosage and     time of specimen collection.     A peak range is 20.0-40.0 ug/mL     A trough range is 5.0-15.0 ug/mL             CBC     Status: Abnormal   Collection Time    08/19/13  7:35 AM      Result Value Range   WBC 12.8 (*) 4.0 - 10.5 K/uL   RBC 3.32 (*) 3.87 - 5.11 MIL/uL   Hemoglobin 9.3 (*) 12.0 - 15.0 g/dL   HCT 16.1 (*) 09.6 - 04.5 %   MCV 82.5  78.0 - 100.0 fL   MCH 28.0  26.0 - 34.0 pg   MCHC 33.9  30.0 - 36.0 g/dL   RDW 40.9  81.1 - 91.4 %   Platelets 261  150 - 400 K/uL  BASIC METABOLIC PANEL     Status: Abnormal   Collection Time    08/19/13  7:35 AM      Result Value Range   Sodium 130 (*) 137 - 147 mEq/L   Comment: Please note change in reference range.   Potassium 5.4 (*) 3.7 - 5.3 mEq/L   Comment: Please note change in reference range.   Chloride 90 (*) 96 - 112 mEq/L   CO2 21  19 - 32 mEq/L   Glucose, Bld 222 (*) 70 - 99 mg/dL   BUN 53 (*) 6 - 23 mg/dL   Creatinine, Ser 7.82 (*) 0.50 - 1.10 mg/dL   Calcium 9.0  8.4 - 95.6 mg/dL   GFR calc non Af Amer 7 (*) >90 mL/min   GFR calc Af Amer 8 (*) >90 mL/min   Comment: (NOTE)     The eGFR has been calculated using the CKD EPI equation.     This calculation has not been validated in all clinical situations.     eGFR's persistently <90 mL/min signify possible Chronic Kidney     Disease.  CK     Status: Abnormal   Collection Time    08/19/13  7:35 AM      Result Value Range   Total CK 2875 (*) 7 - 177 U/L  GLUCOSE, CAPILLARY     Status: Abnormal   Collection Time    08/19/13  7:42 AM      Result Value Range   Glucose-Capillary 225 (*)  70 - 99 mg/dL  GLUCOSE, CAPILLARY     Status: Abnormal   Collection Time    08/19/13 11:53 AM      Result Value Range   Glucose-Capillary 276 (*) 70 - 99 mg/dL  SODIUM, URINE, RANDOM     Status:  None   Collection Time    08/19/13 12:25 PM      Result Value Range   Sodium, Ur 28    CREATININE, URINE, RANDOM     Status: None   Collection Time    08/19/13 12:25 PM      Result Value Range   Creatinine, Urine 183.08    URINALYSIS, ROUTINE W REFLEX MICROSCOPIC     Status: Abnormal   Collection Time    08/19/13 12:25 PM      Result Value Range   Color, Urine AMBER (*) YELLOW   Comment: BIOCHEMICALS MAY BE AFFECTED BY COLOR   APPearance TURBID (*) CLEAR   Specific Gravity, Urine 1.020  1.005 - 1.030   pH 5.0  5.0 - 8.0   Glucose, UA 100 (*) NEGATIVE mg/dL   Hgb urine dipstick LARGE (*) NEGATIVE   Bilirubin Urine SMALL (*) NEGATIVE   Ketones, ur NEGATIVE  NEGATIVE mg/dL   Protein, ur 161 (*) NEGATIVE mg/dL   Urobilinogen, UA 0.2  0.0 - 1.0 mg/dL   Nitrite NEGATIVE  NEGATIVE   Leukocytes, UA SMALL (*) NEGATIVE  URINE MICROSCOPIC-ADD ON     Status: Abnormal   Collection Time    08/19/13 12:25 PM      Result Value Range   Squamous Epithelial / LPF MANY (*) RARE   WBC, UA 7-10  <3 WBC/hpf   RBC / HPF 3-6  <3 RBC/hpf   Bacteria, UA MANY (*) RARE   Casts GRANULAR CAST (*) NEGATIVE   Comment: WAXY CAST   Urine-Other AMORPHOUS URATES/PHOSPHATES    GLUCOSE, CAPILLARY     Status: Abnormal   Collection Time    08/19/13  4:08 PM      Result Value Range   Glucose-Capillary 224 (*) 70 - 99 mg/dL  BLOOD GAS, ARTERIAL     Status: Abnormal   Collection Time    08/19/13  7:30 PM      Result Value Range   O2 Content 2.0     Delivery systems NASAL CANNULA     pH, Arterial 7.270 (*) 7.350 - 7.450   pCO2 arterial 50.4 (*) 35.0 - 45.0 mmHg   pO2, Arterial 76.5 (*) 80.0 - 100.0 mmHg   Bicarbonate 22.4  20.0 - 24.0 mEq/L   TCO2 23.9  0 - 100 mmol/L   Acid-base deficit 3.5 (*) 0.0 - 2.0  mmol/L   O2 Saturation 92.6     Patient temperature 98.6     Collection site RIGHT RADIAL     Drawn by [phone]     Sample type ARTERIAL DRAW     Allens test (pass/fail) PASS  PASS    US Renal  08/19/2013   CLINICAL DATA:  Increasing creatinine.  EXAM: RENAL/URINARY TRACT ULTRASOUND COMPLETE  COMPARISON:  CT abdomen and pelvis 08/16/2013  FINDINGS: Right Kidney:  Length: 13.1 cm. Normal parenchymal thickness with mild increased cortical echotexture. Changes suggest medical renal disease. No hydronephrosis or solid mass.  Left Kidney:  Length: 12.4 cm. Normal parenchymal thickness with mild increased cortical echotexture. Changes suggest medical renal disease. No hydronephrosis or solid mass.  Bladder:  Bladder is decompressed and not visualized.  IMPRESSION: The Mild increased parenchymal echotexture in both kidneys suggesting medical renal disease. No evidence of hydronephrosis. No significant atrophy.   Electronically Signed   By: Burman Nieves M.D.   On: 08/19/2013 02:01    Review of Systems  Constitutional: Positive for fever and malaise/fatigue.  HENT: Negative.  Eyes: Negative.   Respiratory: Negative.   Cardiovascular: Negative.   Gastrointestinal: Negative.  Negative for nausea and vomiting.  Genitourinary: Negative for dysuria, urgency, frequency, hematuria and flank pain.  Musculoskeletal: Negative.   Skin: Negative.   Neurological: Negative.   Endo/Heme/Allergies: Negative.   Psychiatric/Behavioral: Negative.    Blood pressure 171/58, pulse 79, temperature 97.4 F (36.3 C), temperature source Oral, resp. rate 18, height 5\' 5"  (1.651 m), weight 102.059 kg (225 lb), SpO2 93.00%. Physical Exam  Constitutional: She is oriented to person, place, and time.  Morbid obesity, lethargic. Responsive only to very active stimuli.  HENT:  Head: Normocephalic and atraumatic.  Eyes: Pupils are equal, round, and reactive to light.  Neck: Normal range of motion. Neck supple.   Cardiovascular: Normal rate.   Respiratory: Effort normal.  GI: Soft.  Obesity limits sensitivity of exam  Genitourinary:  Foley c/d/i with cloudy urine.   Musculoskeletal: Normal range of motion.  Neurological: She is alert and oriented to person, place, and time.  Skin: Skin is warm and dry.  Psychiatric: She has a normal mood and affect. Her behavior is normal. Judgment and thought content normal.    Assessment/Plan:  1 - Emphysematous Cystitis - impressive case, likely due to underlying hyperglysemia / glycosuria. Rec continue catheter until systemic infectious parameters and need for close UOP monitoring subsided, as well as at least 2 weeks CX-specifc ABX. Should current UCX be negative, would then likely continue cephalosporin such as current rocephin or PO equivalent for course. I do not favor any sort of GI-GU fistula given gas pattern and lack of risk-factors for such.  2 - Acute Renal Failure - Low suspicion for obstructive etiology given lack of hydro. Suspect acute medial renal stress from contrast, hypotension, ABX as likely cause.  No indication for GU operative intervention. Will follow prn while in house. Please call anytime with questions.   Keyan Folson 08/19/2013, 8:13 PM

## 2013-08-20 ENCOUNTER — Encounter (HOSPITAL_COMMUNITY): Payer: Self-pay | Admitting: Radiology

## 2013-08-20 ENCOUNTER — Inpatient Hospital Stay (HOSPITAL_COMMUNITY): Payer: Medicaid Other

## 2013-08-20 DIAGNOSIS — N390 Urinary tract infection, site not specified: Secondary | ICD-10-CM

## 2013-08-20 DIAGNOSIS — R0602 Shortness of breath: Secondary | ICD-10-CM

## 2013-08-20 DIAGNOSIS — E7800 Pure hypercholesterolemia, unspecified: Secondary | ICD-10-CM

## 2013-08-20 LAB — C3 COMPLEMENT: C3 Complement: 177 mg/dL (ref 90–180)

## 2013-08-20 LAB — BASIC METABOLIC PANEL
CO2: 25 mEq/L (ref 19–32)
Calcium: 8.8 mg/dL (ref 8.4–10.5)
Chloride: 100 mEq/L (ref 96–112)
Creatinine, Ser: 3.77 mg/dL — ABNORMAL HIGH (ref 0.50–1.10)
Glucose, Bld: 68 mg/dL — ABNORMAL LOW (ref 70–99)
Potassium: 4.4 mEq/L (ref 3.7–5.3)
Sodium: 141 mEq/L (ref 137–147)

## 2013-08-20 LAB — CBC
HCT: 25.2 % — ABNORMAL LOW (ref 36.0–46.0)
Hemoglobin: 8.4 g/dL — ABNORMAL LOW (ref 12.0–15.0)
MCHC: 33.3 g/dL (ref 30.0–36.0)
Platelets: 286 10*3/uL (ref 150–400)
RBC: 3 MIL/uL — ABNORMAL LOW (ref 3.87–5.11)

## 2013-08-20 LAB — C4 COMPLEMENT: Complement C4, Body Fluid: 31 mg/dL (ref 10–40)

## 2013-08-20 LAB — URINE CULTURE: Colony Count: NO GROWTH

## 2013-08-20 LAB — GLUCOSE, CAPILLARY
Glucose-Capillary: 76 mg/dL (ref 70–99)
Glucose-Capillary: 108 mg/dL — ABNORMAL HIGH (ref 70–99)

## 2013-08-20 LAB — VANCOMYCIN, RANDOM: Vancomycin Rm: 15 ug/mL

## 2013-08-20 MED ORDER — VANCOMYCIN HCL IN DEXTROSE 750-5 MG/150ML-% IV SOLN
750.0000 mg | Freq: Once | INTRAVENOUS | Status: AC
  Administered 2013-08-20: 750 mg via INTRAVENOUS
  Filled 2013-08-20: qty 150

## 2013-08-20 MED ORDER — DOCUSATE SODIUM 100 MG PO CAPS
100.0000 mg | ORAL_CAPSULE | Freq: Two times a day (BID) | ORAL | Status: DC
  Administered 2013-08-21 – 2013-08-28 (×11): 100 mg via ORAL
  Filled 2013-08-20 (×18): qty 1

## 2013-08-20 MED ORDER — WHITE PETROLATUM GEL
Status: AC
  Administered 2013-08-20: 0.2
  Filled 2013-08-20: qty 5

## 2013-08-20 NOTE — Progress Notes (Signed)
ANTIBIOTIC CONSULT NOTE - Follow-Up  Pharmacy Consult for Vancomycin Indication: Cellulitis   Allergies  Allergen Reactions  . Gadolinium      Code: VOM, Desc: Pt began vomiting immed post infusion of multihance, Onset Date: 95621308   . Ivp Dye [Iodinated Diagnostic Agents] Nausea And Vomiting  . Metformin Other (See Comments)     diarrhea  . Penicillins Hives    Patient Measurements: Height: 5\' 5"  (165.1 cm) Weight: 225 lb (102.059 kg) IBW/kg (Calculated) : 57 Adjusted Body Weight: n/a  Vital Signs: Temp: 102.2 F (39 C) (12/31 0854) Temp src: Oral (12/31 0854) BP: 141/53 mmHg (12/31 0518) Pulse Rate: 84 (12/31 0518) Intake/Output from previous day: 12/30 0701 - 12/31 0700 In: 1230 [P.O.:580; I.V.:600; IV Piggyback:50] Out: 510 [Urine:510] Intake/Output from this shift: Total I/O In: 30 [P.O.:30] Out: 2650 [Urine:2650]  Labs:  Recent Labs  08/18/13 0642 08/19/13 0735 08/19/13 1225 08/20/13 0925  WBC 15.1* 12.8*  --  11.5*  HGB 10.7* 9.3*  --  8.4*  PLT 286 261  --  286  LABCREA  --   --  183.08  --   CREATININE 5.62* 6.60*  --   --    Estimated Creatinine Clearance: 11.8 ml/min (by C-G formula based on Cr of 6.6).  Recent Labs  08/19/13 0735 08/20/13 0600  VANCORANDOM 20.2 15.0     Microbiology: Recent Results (from the past 720 hour(s))  MRSA PCR SCREENING     Status: None   Collection Time    08/16/13  4:22 AM      Result Value Range Status   MRSA by PCR NEGATIVE  NEGATIVE Final   Comment:            The GeneXpert MRSA Assay (FDA     approved for NASAL specimens     only), is one component of a     comprehensive MRSA colonization     surveillance program. It is not     intended to diagnose MRSA     infection nor to guide or     monitor treatment for     MRSA infections.  CULTURE, BLOOD (ROUTINE X 2)     Status: None   Collection Time    08/16/13 11:00 AM      Result Value Range Status   Specimen Description BLOOD RIGHT ARM   Final    Special Requests BOTTLES DRAWN AEROBIC AND ANAEROBIC 10CC   Final   Culture  Setup Time     Final   Value: 08/16/2013 18:50     Performed at [facility]   Culture     Final   Value:        BLOOD CULTURE RECEIVED NO GROWTH TO DATE CULTURE WILL BE HELD FOR 5 DAYS BEFORE ISSUING A FINAL NEGATIVE REPORT     Performed at [facility]   Report Status PENDING   Incomplete  CULTURE, BLOOD (ROUTINE X 2)     Status: None   Collection Time    08/16/13 11:15 AM      Result Value Range Status   Specimen Description BLOOD RIGHT ARM   Final   Special Requests BOTTLES DRAWN AEROBIC AND ANAEROBIC 10CC   Final   Culture  Setup Time     Final   Value: 08/16/2013 18:50     Performed at [facility]   Culture     Final   Value:        BLOOD CULTURE RECEIVED  NO GROWTH TO DATE CULTURE WILL BE HELD FOR 5 DAYS BEFORE ISSUING A FINAL NEGATIVE REPORT     Performed at [facility]   Report Status PENDING   Incomplete    Medical History: Past Medical History  Diagnosis Date  . Diabetic neuropathy   . Cellulitis   . Thyroid disease   . Hyperlipidemia   . Heart murmur   . Neuromuscular disorder   . Concussion as a teenager    slight  . Diarrhea     pt takes Reglan tid  . Hypothyroidism     takes Synthroid daily  . Depression     takes CYmbalta daily  . Peripheral edema     takes Lasix daily  . MRSA (methicillin resistant staph aureus) culture positive     hx of 2012  . Staph infection 2007  . Complication of anesthesia 01/2013    "didn't know where I was; who I was; talking out the top of my head" (May 18, 2013)  . Hypertension   . Peripheral vascular disease   . CHF (congestive heart failure)   . Pneumonia 1980's    "hospitalized w/it" (2013/05/18)  . Chronic bronchitis   . Orthopnea     "just one; yesterday morning" (05/18/13)  . Type II diabetes mellitus     Novolog,Levemir,and Lovaza daily  . GERD (gastroesophageal reflux disease)   . Daily headache   .  Rheumatoid arthritis     "both knees" (May 18, 2013)  . Anxiety   . Kidney stone     "passed on before" (18-May-2013)    Medications:  Prescriptions prior to admission  Medication Sig Dispense Refill  . albuterol (PROVENTIL HFA;VENTOLIN HFA) 108 (90 BASE) MCG/ACT inhaler Inhale 2 puffs into the lungs every 6 (six) hours as needed for shortness of breath.  1 Inhaler  2  . aspirin EC 81 MG tablet Take 81 mg by mouth daily.      Marland Kitchen atorvastatin (LIPITOR) 80 MG tablet Take 80 mg by mouth daily.      . clindamycin (CLEOCIN) 300 MG capsule Take 300 mg by mouth 3 (three) times daily. Started on 08-11-13 for 10 days.      Marland Kitchen FLUoxetine (PROZAC) 20 MG tablet Take 20 mg by mouth 2 (two) times daily.       . Fluticasone Furoate-Vilanterol (BREO ELLIPTA) 100-25 MCG/INH AEPB Inhale 1 puff into the lungs daily.      . furosemide (LASIX) 40 MG tablet Take 1 tablet (40 mg total) by mouth daily.  30 tablet  0  . gabapentin (NEURONTIN) 800 MG tablet Take 800 mg by mouth 3 (three) times daily.       . hydrochlorothiazide (MICROZIDE) 12.5 MG capsule Take 12.5 mg by mouth daily.      . insulin aspart (NOVOLOG) 100 UNIT/ML injection Inject 36-48 Units into the skin 3 (three) times daily with meals. *per sliding scale      . insulin detemir (LEVEMIR) 100 UNIT/ML injection Inject 140 Units into the skin every morning. *uses 70 units on each side      . levothyroxine (SYNTHROID, LEVOTHROID) 25 MCG tablet Take 25 mcg by mouth every morning.       Marland Kitchen lisinopril (PRINIVIL,ZESTRIL) 5 MG tablet Take 5 mg by mouth daily.      . metoCLOPramide (REGLAN) 5 MG tablet Take 5 mg by mouth 3 (three) times daily.       Marland Kitchen nystatin (MYCOSTATIN/NYSTOP) 100000 UNIT/GM POWD Apply topically.      Marland Kitchen  omeprazole (PRILOSEC) 20 MG capsule Take 20 mg by mouth daily.      Marland Kitchen oxybutynin (DITROPAN) 5 MG tablet Take 5 mg by mouth every morning.        Marland Kitchen oxyCODONE-acetaminophen (PERCOCET) 10-325 MG per tablet Take 1 tablet by mouth every 4 (four) hours as  needed for pain.      Marland Kitchen tiotropium (SPIRIVA) 18 MCG inhalation capsule Place 18 mcg into inhaler and inhale daily.       Assessment: 17 YOF with sepsis secondary to cellulits of left leg. Currently on IV ceftriaxone and IV vancomycin. CT scan does not show any abscess at this time. Blood Cx with no growth to date,wbc trending down.  Is febrile this AM, Tm 102.2.  Renal function continuing to decline, however no new BMET available today.  Vancomycin random level now within limits for redosing.  12/27 CTX>> 12/27 vanc 1g x 1 12/28 vanc 1500 x 1 > 12/30 level 20.1 > 12/31 level 15   12/27 bcx x2>> ngtd MRSA PCR >> neg 12/30 UCx > pending  Goal of Therapy:  Vancomycin trough level 10-15 mcg/ml  Plan:  1) Vancomycin 750 mg IV x 1 now. 2) Continue  Ceftriaxone 1 gm IV Q 24 hours  3) Will likely recheck random vancomycin level on Friday AM.  Reece Leader, Loura Back, BCPS  Clinical Pharmacist Pager [phone]  08/20/2013 11:05 AM

## 2013-08-20 NOTE — Progress Notes (Signed)
NUTRITION FOLLOW-UP  DOCUMENTATION CODES Per approved criteria  -Obesity Unspecified   INTERVENTION: Continue Glucerna Shake once daily. Recommend more aggressive bowel regimen if pt's last BM was actually 12/22 (RN unable to confirm/verify this.) RD to continue to follow nutrition care plan.  NUTRITION DIAGNOSIS: Inadequate oral intake related to acute illness as evidenced by PO intake 0-30% of meals. Ongoing.  Goal: Pt to meet >/= 90% of their estimated nutrition needs   Monitor:  PO intake Weight Labs  ASSESSMENT: 52 y.o. female with past medical history of diabetes type 2 with hemoglobin A1c of 12.9 back on June, right BKA chronic diastolic heart failure, and hypertension that comes in for left lower extremity swelling and redness that started about 9 days prior to admission after a fall. She relates she's been having fevers and chills at home accompanied by nausea and vomiting unable to tolerate the clindamycin that her primary care doctor prescribe her 3 days ago she decided to come to the ED.  RN reports that pt has been lethargic over the past few days but is now more alert (at this time.) Pt states that she has received Glucerna Shakes but doesn't really care for them. Family at bedside reports that pt's lethargy is what caused her poor oral intake. Encouraged pt to drink Glucernas. Pt asking when she can go home.  Sodium is low at 130 but trending up. Potassium elevated at 5.4  Height: Ht Readings from Last 1 Encounters:  08/16/13 5\' 5"  (1.651 m)    Weight: Wt Readings from Last 1 Encounters:  08/19/13 225 lb (102.059 kg)  Admit wt 213 lb - wt trending up  Estimated Nutritional Needs: Kcal: 1700-1900 Protein: 100-110 grams Fluid: approx 2 L/day  Skin: +1 LLE edema and cellulitis; diabetic ulcer of left foot; abrasions on right knee  Diet Order: Carb Control  EDUCATION NEEDS: -No education needs identified at this time   Intake/Output Summary (Last 24  hours) at 08/20/13 1127 Last data filed at 08/20/13 1013  Gross per 24 hour  Intake    850 ml  Output   3160 ml  Net  -2310 ml    Last BM: 12/22  Labs:   Recent Labs Lab 08/17/13 1030 08/18/13 0642 08/19/13 0735  NA 128* 127* 130*  K 3.9 5.4* 5.4*  CL 90* 89* 90*  CO2 22 19 21   BUN 32* 43* 53*  CREATININE 3.38* 5.62* 6.60*  CALCIUM 8.8 9.5 9.0  GLUCOSE 237* 343* 222*    CBG (last 3)   Recent Labs  08/19/13 1608 08/19/13 2144 08/20/13 0754  GLUCAP 224* 165* 90    Scheduled Meds: . aspirin EC  81 mg Oral Daily  . atorvastatin  80 mg Oral Daily  . cefTRIAXone (ROCEPHIN)  IV  1 g Intravenous Q24H  . feeding supplement (GLUCERNA SHAKE)  237 mL Oral Daily  . FLUoxetine  20 mg Oral BID  . Fluticasone Furoate-Vilanterol  1 puff Inhalation Daily  . gabapentin  100 mg Oral BID  . heparin  5,000 Units Subcutaneous Q8H  . insulin aspart  0-15 Units Subcutaneous TID WC  . insulin aspart  0-5 Units Subcutaneous QHS  . insulin aspart  20 Units Subcutaneous TID WC  . insulin aspart  4 Units Subcutaneous TID WC  . insulin detemir  140 Units Subcutaneous Daily  . levothyroxine  25 mcg Oral QAC breakfast  . metoCLOPramide  5 mg Oral TID  . multivitamin with minerals  1 tablet Oral Daily  .  oxybutynin  5 mg Oral QAC breakfast  . pantoprazole  40 mg Oral Daily  . tiotropium  18 mcg Inhalation Daily  . vancomycin  750 mg Intravenous Once    Continuous Infusions: . sodium chloride 50 mL/hr at 08/19/13 1827    Jarold Motto MS, RD, LDN Pager: [phone] After-hours pager: [phone]

## 2013-08-20 NOTE — Progress Notes (Signed)
[facility]                                                                                Patient Demographics  [name], is a 52 y.o. female, DOB - Nov 14, 1960, GEX:[id number]  Admit date - 08/15/2013   Admitting Physician Nichole Parker, MD  Outpatient Primary MD for the patient is [name] Nichole Saner, MD  LOS - 5   Chief Complaint  Patient presents with  . Leg Swelling        Assessment & Plan  Principal Problem:   Cellulitis of left leg Active Problems:   Diabetes mellitus type 2, uncontrolled, with complications   Hyponatremia   HTN (hypertension)   Leukocytosis   ARF (acute renal failure)   Chronic diastolic heart failure   Sepsis   Left leg cellulitis  Sepsis secondary to Cellulitis of left leg versus UTI/cystitis -Cellulitis and leukocytosis improving  -Continue ceftriaxone and vancomycin -Blood cultures show no growth to date -UA showed showed 7-10 white blood cells, small leukocytes, urine culture currently pending -CT scan does not show any abscess at this time -Continue pain control  ARF (acute renal failure)/ATN -Likely secondary to dehydration versus contrast versus infection -Creatinine currently improving, 3.77 -Will gently hydrate with IV fluids -Holding nephrotoxic agents, Lasix, HCTZ and lisinopril -Nephrology following -Renal US: The Mild increased parenchymal echotexture in both kidneys suggesting medical renal disease. No evidence of hydronephrosis. No significant atrophy.  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 -Consulted Dr. Berneice Heinrich, urologist  Diabetes mellitus type 2, uncontrolled, with complications:  -Continue home insulin regimen of Levemir 140 units daily -Continue insulin sliding scale with CBG monitoring -HbA1c 14.7 -Diabetes coordinator following -Continue gabapentin, renally adjusted  Hyponatremia -Improving, Na 141, Likely secondary to  dehydration with acute renal injury. -Will give gentle IV fluid rehydration due to history of diastolic CHF -Will continue to monitor.  Hyperkalemia -Resolved, K 4.4 Likely secondary to acute renal failure  HTN (hypertension)  -Currently controlled.  -Holding Lasix, HCTZ, and lisinopril due to the acute kidney injury. -Will add on IV medications if needed.  Chronic diastolic heart failure  -Currently compensated, not in exacerbation. -Will continue to monitor her her daily weights, intake and output, fluid restriction.  Depression -Continue fluoxetine  Hypothyroidism -Continue Synthroid  COPD -Compensated, continued spiriva and breo  Code Status: Full  Family Communication: None at bedside.  Disposition Plan: Admitted.     Procedures  -Renal US: The Mild increased parenchymal echotexture in both kidneys suggesting medical renal disease. No evidence of hydronephrosis. No significant atrophy.  Consults   Nephrology Urology  DVT Prophylaxis  Heparin   Lab Results  Component Value Date   PLT 286 08/20/2013    Medications  Scheduled Meds: . aspirin EC  81 mg Oral Daily  . atorvastatin  80 mg Oral Daily  . cefTRIAXone (ROCEPHIN)  IV  1 g Intravenous Q24H  . docusate sodium  100 mg Oral BID  . feeding supplement (GLUCERNA SHAKE)  237 mL Oral Daily  . FLUoxetine  20 mg Oral BID  . Fluticasone Furoate-Vilanterol  1 puff Inhalation Daily  . gabapentin  100  mg Oral BID  . heparin  5,000 Units Subcutaneous Q8H  . insulin aspart  0-15 Units Subcutaneous TID WC  . insulin aspart  0-5 Units Subcutaneous QHS  . insulin aspart  20 Units Subcutaneous TID WC  . insulin detemir  140 Units Subcutaneous Daily  . levothyroxine  25 mcg Oral QAC breakfast  . metoCLOPramide  5 mg Oral TID  . multivitamin with minerals  1 tablet Oral Daily  . oxybutynin  5 mg Oral QAC breakfast  . pantoprazole  40 mg Oral Daily  . tiotropium  18 mcg Inhalation Daily   Continuous  Infusions: . sodium chloride 50 mL/hr at 08/19/13 1827   PRN Meds:.acetaminophen, acetaminophen, hydrALAZINE, morphine injection, ondansetron (ZOFRAN) IV, ondansetron, oxyCODONE, oxyCODONE-acetaminophen, polyethylene glycol  Antibiotics    Anti-infectives   Start     Dose/Rate Route Frequency Ordered Stop   08/20/13 1100  vancomycin (VANCOCIN) IVPB 750 mg/150 ml premix     750 mg 150 mL/hr over 60 Minutes Intravenous  Once 08/20/13 1059 08/20/13 1407   08/19/13 1800  vancomycin (VANCOCIN) 1,500 mg in sodium chloride 0.9 % 500 mL IVPB  Status:  Discontinued     1,500 mg 250 mL/hr over 120 Minutes Intravenous Every 48 hours 08/17/13 1800 08/17/13 1804   08/17/13 1900  vancomycin (VANCOCIN) 1,500 mg in sodium chloride 0.9 % 500 mL IVPB  Status:  Discontinued     1,500 mg 250 mL/hr over 120 Minutes Intravenous Every 48 hours 08/17/13 1804 08/18/13 1129   08/17/13 1830  vancomycin (VANCOCIN) 2,000 mg in sodium chloride 0.9 % 500 mL IVPB  Status:  Discontinued     2,000 mg 250 mL/hr over 120 Minutes Intravenous  Once 08/17/13 1759 08/17/13 1802   08/16/13 1200  vancomycin (VANCOCIN) IVPB 1000 mg/200 mL premix  Status:  Discontinued     1,000 mg 200 mL/hr over 60 Minutes Intravenous Every 12 hours 08/16/13 0344 08/16/13 0819   08/16/13 0900  cefTRIAXone (ROCEPHIN) 1 g in dextrose 5 % 50 mL IVPB     1 g 100 mL/hr over 30 Minutes Intravenous Every 24 hours 08/16/13 0819     08/16/13 0300  ertapenem (INVANZ) 1 g in sodium chloride 0.9 % 50 mL IVPB     1 g 100 mL/hr over 30 Minutes Intravenous  Once 08/16/13 0236 08/16/13 0349   08/15/13 2315  vancomycin (VANCOCIN) IVPB 1000 mg/200 mL premix     1,000 mg 200 mL/hr over 60 Minutes Intravenous  Once 08/15/13 2302 08/16/13 0316       Time Spent in minutes   25 minutes   [name] D.O. on 08/20/2013 at 2:14 PM  Between 7am to 7pm - Pager - [phone]  After 7pm go to [email or web address] - password [email or web address]  And look for the night  coverage person covering for me after hours  [facility] Office  [phone]    Subjective:   Sonyia [name] seen and examined today.  Patient currently has little to no leg pain. States she feels her leg pain is improved. Patient continues to feel dry..  Objective:   Filed Vitals:   08/20/13 0518 08/20/13 0854 08/20/13 0900 08/20/13 0938  BP: 141/53  149/47   Pulse: 84  82   Temp: 99 F (37.2 C) 102.2 F (39 C) 102.2 F (39 C)   TempSrc: Oral Oral Oral   Resp: 18  20   Height:      Weight:      SpO2: 96%  95% 96%    Wt Readings from Last 3 Encounters:  08/19/13 102.059 kg (225 lb)  05/05/13 100.426 kg (221 lb 6.4 oz)  04/22/13 99.292 kg (218 lb 14.4 oz)     Intake/Output Summary (Last 24 hours) at 08/20/13 1414 Last data filed at 08/20/13 1057  Gross per 24 hour  Intake    840 ml  Output   3150 ml  Net  -2310 ml    Exam  General: Well developed, well nourished, NAD, appears stated age  HEENT: NCAT, PERRLA, EOMI, Anicteic Sclera, mucous membranes dry.   Neck: Supple, no JVD, no masses  Cardiovascular: S1 S2 auscultated, no rubs, murmurs or gallops. Regular rate and rhythm.  Respiratory: Clear to auscultation bilaterally with equal chest rise  Abdomen: Soft, nontender, nondistended, + bowel sounds  Extremities: warm dry without cyanosis clubbing of LLE.  Cellulitis marked on the LLE, improving.  L thigh shows indurated area, warm to touch, tenderness to palpation.    Neuro: AAOx3, cranial nerves grossly intact.  Skin: Without rashes exudates or nodules, cellulitis as noted above.  Psych: Normal affect and demeanor with intact judgement and insight  Data Review   Micro Results Recent Results (from the past 240 hour(s))  MRSA PCR SCREENING     Status: None   Collection Time    08/16/13  4:22 AM      Result Value Range Status   MRSA by PCR NEGATIVE  NEGATIVE Final   Comment:            The GeneXpert MRSA Assay (FDA     approved for  NASAL specimens     only), is one component of a     comprehensive MRSA colonization     surveillance program. It is not     intended to diagnose MRSA     infection nor to guide or     monitor treatment for     MRSA infections.  CULTURE, BLOOD (ROUTINE X 2)     Status: None   Collection Time    08/16/13 11:00 AM      Result Value Range Status   Specimen Description BLOOD RIGHT ARM   Final   Special Requests BOTTLES DRAWN AEROBIC AND ANAEROBIC 10CC   Final   Culture  Setup Time     Final   Value: 08/16/2013 18:50     Performed at [facility]   Culture     Final   Value:        BLOOD CULTURE RECEIVED NO GROWTH TO DATE CULTURE WILL BE HELD FOR 5 DAYS BEFORE ISSUING A FINAL NEGATIVE REPORT     Performed at [facility]   Report Status PENDING   Incomplete  CULTURE, BLOOD (ROUTINE X 2)     Status: None   Collection Time    08/16/13 11:15 AM      Result Value Range Status   Specimen Description BLOOD RIGHT ARM   Final   Special Requests BOTTLES DRAWN AEROBIC AND ANAEROBIC 10CC   Final   Culture  Setup Time     Final   Value: 08/16/2013 18:50     Performed at [facility]   Culture     Final   Value:        BLOOD CULTURE RECEIVED NO GROWTH TO DATE CULTURE WILL BE HELD FOR 5 DAYS BEFORE ISSUING A FINAL NEGATIVE REPORT     Performed at [facility]   Report Status PENDING  Incomplete    Radiology Reports Ct Abdomen Pelvis Wo Contrast  08/16/2013   CLINICAL DATA:  Abdominal pain.  EXAM: CT ABDOMEN AND PELVIS WITHOUT CONTRAST  TECHNIQUE: Multidetector CT imaging of the abdomen and pelvis was performed following the standard protocol without intravenous contrast.  COMPARISON:  CT of the left femur performed 08/15/2013, and CT of the pelvis performed 01/23/2013  FINDINGS: The visualized lung bases are clear.  The liver and spleen are unremarkable in appearance. The gallbladder is within normal limits. The pancreas and left adrenal gland are unremarkable. A  1.2 cm right adrenal lesion is noted, too high in attenuation to characterize as an adrenal adenoma on this study.  Residual contrast is seen within the renal calyces. Nonspecific perinephric stranding is noted bilaterally. A small 1.0 cm hypodensity at the lower pole of the right kidney may reflect a small cyst. There is no evidence of hydronephrosis. No definite obstructing ureteral stones are seen.  No free fluid is identified. The small bowel is unremarkable in appearance. The stomach is within normal limits. No acute vascular abnormalities are seen.  The appendix is normal in caliber and contains air, without evidence for appendicitis. The colon is unremarkable in appearance.  The bladder is largely filled with contrast. The previously noted debris and air in the bladder are displaced by contrast. The amount of debris seems decreased from the recent prior study; some of the debris may have been passed in the urine. Scattered tiny foci of air are again seen outside the bladder wall; the appearance raises suspicion for emphysematous cystitis. The uterus is unremarkable in appearance. The ovaries are relatively symmetric; no suspicious adnexal masses are seen. Enlarged left inguinal nodes are again seen.  No acute osseous abnormalities are identified.  IMPRESSION: 1. Previously noted debris and air in the bladder are displaced by contrast. The amount of debris appears decreased from the recent prior study; some of the debris may have been passed in the urine. Scattered tiny foci of air again seen outside the bladder wall; the appearance raises suspicion for emphysematous cystitis. 2. Small right renal cyst. 3. 1.2 cm right adrenal lesion, too high in attenuation to characterize as an adrenal adenoma on this study. Adrenal protocol CT or MRI could be considered for further evaluation, when and as deemed clinically appropriate. 4. Enlarged left inguinal nodes again seen.   Electronically Signed   By: Roanna Raider  M.D.   On: 08/16/2013 03:51   Ct Femur Left W Contrast  08/16/2013   CLINICAL DATA:  Left thigh swelling and erythema, with left lateral thigh pain just above the knee.  EXAM: CT OF THE LEFT FEMUR WITH CONTRAST  TECHNIQUE: Multidetector CT imaging of the left femur was performed according to the standard protocol following intravenous contrast administration. Multiplanar CT image reconstructions were also generated.  CONTRAST:  OMNIPAQUE IOHEXOL 300 MG/ML  SOLN  COMPARISON:  Left knee radiographs performed 01/16/2006, and CT of the pelvis performed 01/23/2013  FINDINGS: There is diffuse soft tissue inflammation and edema along the lateral aspect of the left thigh, extending inferiorly below the left knee. Associated significant skin thickening is seen. Findings are most compatible with cellulitis. There is no evidence of underlying abscess at this time. There is minimal associated soft tissue edema underlying the fascia, though the underlying musculature is generally intact.  There is no evidence of vascular compromise. Very small knee joint effusion is noted. The knee joint is otherwise grossly unremarkable in appearance. Enlarged left inguinal  nodes are seen, measuring up to 1.6 cm in short axis. Scattered prominent collateral vessels are noted along the visualized anterior abdominal wall.  The left hip joint is unremarkable in appearance. No hip joint effusion is identified. The visualized portions of the uterus are grossly unremarkable. Note is made of a large amount of debris and solid material filling the bladder, with trace air noted outside of the bladder. This appears new from the CT of the pelvis performed 01/23/2013.  No acute osseous abnormalities are identified.  IMPRESSION: 1. Diffuse soft tissue inflammation and edema along the lateral aspect of the left thigh, extending inferiorly below the left knee, with associated skin thickening, compatible with cellulitis. No evidence of underlying  abscess at this time. Minimal associated soft tissue edema seen underlying the fascia, though the underlying musculature is grossly intact. 2. There is small knee joint effusion noted. 3. Left inguinal lymphadenopathy seen, with nodes measuring up to 1.6 cm in short axis. 4. Prominent collateral vessels noted along the anterior abdominal wall. 5. Large amount of debris and solid material filling the bladder, with trace air noted outside the bladder. This is new from the CT of the pelvis performed 01/23/2013. This is a very unusual appearance, and could conceivably reflect a colovesical fistula, or possibly severe emphysematous cystitis. Would correlate for associated symptoms.  These results were called by telephone at the time of interpretation on 08/16/2013 at 1:27 AM to Dr. Eber Hong , who verbally acknowledged these results.   Electronically Signed   By: Roanna Raider M.D.   On: 08/16/2013 01:29    CBC  Recent Labs Lab 08/15/13 2256 08/16/13 0541 08/17/13 1030 08/18/13 0642 08/19/13 0735 08/20/13 0925  WBC 15.0* 13.1* 14.2* 15.1* 12.8* 11.5*  HGB 12.2 11.1* 11.5* 10.7* 9.3* 8.4*  HCT 34.9* 32.2* 34.2* 31.8* 27.4* 25.2*  PLT 243 230 230 286 261 286  MCV 82.9 83.6 84.9 82.8 82.5 84.0  MCH 29.0 28.8 28.5 27.9 28.0 28.0  MCHC 35.0 34.5 33.6 33.6 33.9 33.3  RDW 13.3 13.4 13.7 13.9 14.1 14.2  LYMPHSABS 2.0  --   --   --   --   --   MONOABS 1.2*  --   --   --   --   --   EOSABS 0.0  --   --   --   --   --   BASOSABS 0.0  --   --   --   --   --     Chemistries   Recent Labs Lab 08/15/13 2256 08/16/13 0541 08/17/13 1030 08/18/13 0642 08/19/13 0735 08/20/13 1140  NA 125* 126* 128* 127* 130* 141  K 3.9 3.6 3.9 5.4* 5.4* 4.4  CL 86* 87* 90* 89* 90* 100  CO2 29 27 22 19 21 25   GLUCOSE 379* 387* 237* 343* 222* 68*  BUN 23 24* 32* 43* 53* 44*  CREATININE 1.50* 1.65* 3.38* 5.62* 6.60* 3.77*  CALCIUM 8.6 8.3* 8.8 9.5 9.0 8.8  AST 8  --  12  --   --   --   ALT 9  --  9  --   --    --   ALKPHOS 90  --  127*  --   --   --   BILITOT 0.4  --  0.2*  --   --   --    ------------------------------------------------------------------------------------------------------------------ estimated creatinine clearance is 20.7 ml/min (by C-G formula based on Cr of 3.77). ------------------------------------------------------------------------------------------------------------------ No results found for this  basename: HGBA1C,  in the last 72 hours ------------------------------------------------------------------------------------------------------------------ No results found for this basename: CHOL, HDL, LDLCALC, TRIG, CHOLHDL, LDLDIRECT,  in the last 72 hours ------------------------------------------------------------------------------------------------------------------  Recent Labs  08/18/13 1300  TSH 4.222   ------------------------------------------------------------------------------------------------------------------ No results found for this basename: VITAMINB12, FOLATE, FERRITIN, TIBC, IRON, RETICCTPCT,  in the last 72 hours  Coagulation profile No results found for this basename: INR, PROTIME,  in the last 168 hours  No results found for this basename: DDIMER,  in the last 72 hours  Cardiac Enzymes No results found for this basename: CK, CKMB, TROPONINI, MYOGLOBIN,  in the last 168 hours ------------------------------------------------------------------------------------------------------------------ No components found with this basename: POCBNP,

## 2013-08-20 NOTE — Progress Notes (Signed)
Spoke with staff RN about patient's Novolog meal coverage orders.  Nurse to clarify orders with physician.  States that patient is not eating and still has decreased mental status.  Difficult to communicate with patient. Will continue to follow while in hospital and will talk with patient when patient is able to communicate effectively. CBGs have improved this AM.   Smith Mince RN BSN CDE

## 2013-08-20 NOTE — Progress Notes (Signed)
1 Acute Kidney Injury of multiple potential etiologies including post infectious, obstructive, hemodynamic with ACE-I on board, rhabdomyolysis exacerbated by iodinated contrast .  Given response coincident with foley catheter placement and improvement in sCreat I am suspicious of obstruction as cause. 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 with hx of chronic urinary incontinence s/p foley by GU Plan:  Continue foley drainage    Subjective: Interval History: Feels better  Objective: Vital signs in last 24 hours: Temp:  [97.4 F (36.3 C)-102.2 F (39 C)] 102.2 F (39 C) (12/31 0900) Pulse Rate:  [74-84] 82 (12/31 0900) Resp:  [18-20] 20 (12/31 0900) BP: (101-171)/(47-76) 149/47 mmHg (12/31 0900) SpO2:  [93 %-96 %] 96 % (12/31 0938) Weight:  [102.059 kg (225 lb)] 102.059 kg (225 lb) (12/30 2140) Weight change: 0 kg (0 lb)  Intake/Output from previous day: 12/30 0701 - 12/31 0700 In: 1230 [P.O.:580; I.V.:600; IV Piggyback:50] Out: 510 [Urine:510] Intake/Output this shift: Total I/O In: 200 [P.O.:150; IV Piggyback:50] Out: 2650 [Urine:2650]  General appearance: alert and cooperative Resp: clear to auscultation bilaterally Cardio: regular rate and rhythm, S1, S2 normal, no murmur, click, rub or gallop GI: soft, non-tender; bowel sounds normal; no masses,  no organomegaly Extremities: rbka, LLE w/ 1+ edema  Lab Results:  Recent Labs  08/19/13 0735 08/20/13 0925  WBC 12.8* 11.5*  HGB 9.3* 8.4*  HCT 27.4* 25.2*  PLT 261 286   BMET:  Recent Labs  08/19/13 0735 08/20/13 1140  NA 130* 141  K 5.4* 4.4  CL 90* 100  CO2 21 25  GLUCOSE 222* 68*  BUN 53* 44*  CREATININE 6.60* 3.77*  CALCIUM 9.0 8.8   No results found for this basename: PTH,  in the last 72 hours Iron Studies: No results found for this basename: IRON, TIBC, TRANSFERRIN, FERRITIN,  in the last 72 hours Studies/Results: Ct Head Wo  Contrast  08/20/2013   CLINICAL DATA:  Altered mental status.  EXAM: CT HEAD WITHOUT CONTRAST  TECHNIQUE: Contiguous axial images were obtained from the base of the skull through the vertex without intravenous contrast.  COMPARISON:  10/06/2011 CT.  05/03/2011 MR.  FINDINGS: No intracranial hemorrhage.  No CT evidence of large acute infarct  No intracranial mass lesion noted on this unenhanced exam.  Calcification adjacent to the superior aspect of the falx unchanged and probably an incidental finding rather than and a meningioma.  Mastoid air cells, middle ear cavities and visualized paranasal sinuses are clear. Very mild exophthalmos.  IMPRESSION: No acute abnormality.  Please see above.   Electronically Signed   By: Bridgett Larsson M.D.   On: 08/20/2013 11:44   US Renal  08/19/2013   CLINICAL DATA:  Increasing creatinine.  EXAM: RENAL/URINARY TRACT ULTRASOUND COMPLETE  COMPARISON:  CT abdomen and pelvis 08/16/2013  FINDINGS: Right Kidney:  Length: 13.1 cm. Normal parenchymal thickness with mild increased cortical echotexture. Changes suggest medical renal disease. No hydronephrosis or solid mass.  Left Kidney:  Length: 12.4 cm. Normal parenchymal thickness with mild increased cortical echotexture. Changes suggest medical renal disease. No hydronephrosis or solid mass.  Bladder:  Bladder is decompressed and not visualized.  IMPRESSION: The Mild increased parenchymal echotexture in both kidneys suggesting medical renal disease. No evidence of hydronephrosis. No significant atrophy.   Electronically Signed   By: Burman Nieves M.D.   On: 08/19/2013 02:01    Scheduled: . aspirin EC  81 mg Oral Daily  . atorvastatin  80  mg Oral Daily  . cefTRIAXone (ROCEPHIN)  IV  1 g Intravenous Q24H  . feeding supplement (GLUCERNA SHAKE)  237 mL Oral Daily  . FLUoxetine  20 mg Oral BID  . Fluticasone Furoate-Vilanterol  1 puff Inhalation Daily  . gabapentin  100 mg Oral BID  . heparin  5,000 Units Subcutaneous Q8H   . insulin aspart  0-15 Units Subcutaneous TID WC  . insulin aspart  0-5 Units Subcutaneous QHS  . insulin aspart  20 Units Subcutaneous TID WC  . insulin aspart  4 Units Subcutaneous TID WC  . insulin detemir  140 Units Subcutaneous Daily  . levothyroxine  25 mcg Oral QAC breakfast  . metoCLOPramide  5 mg Oral TID  . multivitamin with minerals  1 tablet Oral Daily  . oxybutynin  5 mg Oral QAC breakfast  . pantoprazole  40 mg Oral Daily  . tiotropium  18 mcg Inhalation Daily  . vancomycin  750 mg Intravenous Once    LOS: 5 days   Delwin Raczkowski C 08/20/2013,1:36 PM

## 2013-08-21 LAB — CBC
HCT: 24.8 % — ABNORMAL LOW (ref 36.0–46.0)
Hemoglobin: 8.2 g/dL — ABNORMAL LOW (ref 12.0–15.0)
MCH: 28 pg (ref 26.0–34.0)
MCHC: 33.1 g/dL (ref 30.0–36.0)
MCV: 84.6 fL (ref 78.0–100.0)
Platelets: 306 10*3/uL (ref 150–400)
RBC: 2.93 MIL/uL — ABNORMAL LOW (ref 3.87–5.11)
RDW: 14.3 % (ref 11.5–15.5)
WBC: 11.3 10*3/uL — ABNORMAL HIGH (ref 4.0–10.5)

## 2013-08-21 LAB — GLUCOSE, CAPILLARY
GLUCOSE-CAPILLARY: 59 mg/dL — AB (ref 70–99)
GLUCOSE-CAPILLARY: 200 mg/dL — AB (ref 70–99)
Glucose-Capillary: 139 mg/dL — ABNORMAL HIGH (ref 70–99)
Glucose-Capillary: 188 mg/dL — ABNORMAL HIGH (ref 70–99)
Glucose-Capillary: 142 mg/dL — ABNORMAL HIGH (ref 70–99)

## 2013-08-21 LAB — BASIC METABOLIC PANEL
BUN: 43 mg/dL — ABNORMAL HIGH (ref 6–23)
CO2: 23 mEq/L (ref 19–32)
Calcium: 8.9 mg/dL (ref 8.4–10.5)
Chloride: 100 mEq/L (ref 96–112)
Creatinine, Ser: 2.98 mg/dL — ABNORMAL HIGH (ref 0.50–1.10)
GFR calc Af Amer: 20 mL/min — ABNORMAL LOW (ref >90)
GFR calc non Af Amer: 17 mL/min — ABNORMAL LOW (ref >90)
Glucose, Bld: 79 mg/dL (ref 70–99)
Potassium: 4.3 mEq/L (ref 3.7–5.3)
Sodium: 139 mEq/L (ref 137–147)

## 2013-08-21 LAB — RETICULOCYTES
RBC.: 2.74 MIL/uL — AB (ref 3.87–5.11)
RETIC CT PCT: 1.5 % (ref 0.4–3.1)
Retic Count, Absolute: 41.1 10*3/uL (ref 19.0–186.0)

## 2013-08-21 NOTE — Progress Notes (Signed)
[facility]                                                                                Patient Demographics  [name], is a 53 y.o. female, DOB - [birth year], EHO:[id number]  Admit date - 08/15/2013   Admitting Physician Ron Parker, MD  Outpatient Primary MD for the patient is TALBOT, Dineen Kid, MD  LOS - 6   Chief Complaint  Patient presents with  . Leg Swelling        Assessment & Plan  Principal Problem:   Cellulitis of left leg Active Problems:   Diabetes mellitus type 2, uncontrolled, with complications   Hyponatremia   HTN (hypertension)   Leukocytosis   ARF (acute renal failure)   Chronic diastolic heart failure   Sepsis   Left leg cellulitis  Sepsis secondary to Cellulitis of left leg versus UTI/cystitis -Cellulitis and leukocytosis improving  -Continue ceftriaxone and vancomycin -Blood cultures show no growth to date -UA showed showed 7-10 white blood cells, small leukocytes, urine culture shows no growth -CT scan does not show any abscess at this time -Continue pain control  ARF (acute renal failure)/ATN -Likely secondary to dehydration versus contrast versus infection -Creatinine currently improving, 2.98 -Holding nephrotoxic agents, Lasix, HCTZ and lisinopril -Nephrology following -Renal US: The Mild increased parenchymal echotexture in both kidneys suggesting medical renal disease. No evidence of hydronephrosis. No significant atrophy.  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 -Consulted Dr. Berneice Heinrich, urologist  Diabetes mellitus type 2, uncontrolled, with complications:  -Continue home insulin regimen of Levemir 140 units daily -Continue insulin sliding scale with CBG monitoring -HbA1c 14.7 -Diabetes coordinator following -Continue gabapentin, renally adjusted  Hyponatremia -Resolved, Na 139 -Likely secondary to dehydration with acute renal injury. -Will  continue to monitor.  Hyperkalemia -Resolved, K 4.3 Likely secondary to acute renal failure  HTN (hypertension)  -Currently controlled.  -Holding Lasix, HCTZ, and lisinopril due to the acute kidney injury. -hydralazine 10mg  IV q6h PRN  Chronic diastolic heart failure  -Currently compensated, not in exacerbation. -Will continue to monitor her her daily weights, intake and output, fluid restriction.  Depression -Continue fluoxetine  Hypothyroidism -Continue Synthroid  COPD -Compensated, continued spiriva and breo  Anemia -Will obtain anemia panel -may be delusional component -Will continue to monitor CBC  Code Status: Full  Family Communication: None at bedside.  Disposition Plan: Admitted.     Procedures  -Renal US: The Mild increased parenchymal echotexture in both kidneys suggesting medical renal disease. No evidence of hydronephrosis. No significant atrophy.  Consults   Nephrology Urology  DVT Prophylaxis  Heparin   Lab Results  Component Value Date   PLT 306 08/21/2013    Medications  Scheduled Meds: . aspirin EC  81 mg Oral Daily  . atorvastatin  80 mg Oral Daily  . cefTRIAXone (ROCEPHIN)  IV  1 g Intravenous Q24H  . docusate sodium  100 mg Oral BID  . feeding supplement (GLUCERNA SHAKE)  237 mL Oral Daily  . FLUoxetine  20 mg Oral BID  . Fluticasone Furoate-Vilanterol  1 puff Inhalation Daily  . gabapentin  100 mg Oral BID  .  heparin  5,000 Units Subcutaneous Q8H  . insulin aspart  0-15 Units Subcutaneous TID WC  . insulin aspart  0-5 Units Subcutaneous QHS  . insulin aspart  20 Units Subcutaneous TID WC  . insulin detemir  140 Units Subcutaneous Daily  . levothyroxine  25 mcg Oral QAC breakfast  . metoCLOPramide  5 mg Oral TID  . multivitamin with minerals  1 tablet Oral Daily  . oxybutynin  5 mg Oral QAC breakfast  . pantoprazole  40 mg Oral Daily  . tiotropium  18 mcg Inhalation Daily   Continuous Infusions:   PRN Meds:.acetaminophen,  acetaminophen, hydrALAZINE, morphine injection, ondansetron (ZOFRAN) IV, ondansetron, oxyCODONE, oxyCODONE-acetaminophen, polyethylene glycol  Antibiotics    Anti-infectives   Start     Dose/Rate Route Frequency Ordered Stop   08/20/13 1100  vancomycin (VANCOCIN) IVPB 750 mg/150 ml premix     750 mg 150 mL/hr over 60 Minutes Intravenous  Once 08/20/13 1059 08/20/13 1407   08/19/13 1800  vancomycin (VANCOCIN) 1,500 mg in sodium chloride 0.9 % 500 mL IVPB  Status:  Discontinued     1,500 mg 250 mL/hr over 120 Minutes Intravenous Every 48 hours 08/17/13 1800 08/17/13 1804   08/17/13 1900  vancomycin (VANCOCIN) 1,500 mg in sodium chloride 0.9 % 500 mL IVPB  Status:  Discontinued     1,500 mg 250 mL/hr over 120 Minutes Intravenous Every 48 hours 08/17/13 1804 08/18/13 1129   08/17/13 1830  vancomycin (VANCOCIN) 2,000 mg in sodium chloride 0.9 % 500 mL IVPB  Status:  Discontinued     2,000 mg 250 mL/hr over 120 Minutes Intravenous  Once 08/17/13 1759 08/17/13 1802   08/16/13 1200  vancomycin (VANCOCIN) IVPB 1000 mg/200 mL premix  Status:  Discontinued     1,000 mg 200 mL/hr over 60 Minutes Intravenous Every 12 hours 08/16/13 0344 08/16/13 0819   08/16/13 0900  cefTRIAXone (ROCEPHIN) 1 g in dextrose 5 % 50 mL IVPB     1 g 100 mL/hr over 30 Minutes Intravenous Every 24 hours 08/16/13 0819     08/16/13 0300  ertapenem (INVANZ) 1 g in sodium chloride 0.9 % 50 mL IVPB     1 g 100 mL/hr over 30 Minutes Intravenous  Once 08/16/13 0236 08/16/13 0349   08/15/13 2315  vancomycin (VANCOCIN) IVPB 1000 mg/200 mL premix     1,000 mg 200 mL/hr over 60 Minutes Intravenous  Once 08/15/13 2302 08/16/13 0316       Time Spent in minutes   25 minutes   Normand Damron D.O. on 08/21/2013 at 12:13 PM  Between 7am to 7pm - Pager - [phone]  After 7pm go to [email or web address] - password [email or web address]  And look for the night coverage person covering for me after hours  [facility] Office   [phone]    Subjective:   Jamillia Bendell seen and examined today.  Patient has no complaints this morning, she does state that her left pain has improved.  Objective:   Filed Vitals:   08/20/13 1800 08/20/13 2229 08/21/13 0639 08/21/13 0957  BP: 136/51 157/62 147/64 120/59  Pulse: 72 66 69 75  Temp: 98.2 F (36.8 C) 98.3 F (36.8 C) 98.6 F (37 C) 98.4 F (36.9 C)  TempSrc: Oral Oral Oral Oral  Resp: 18 19 16 22   Height:      Weight:  104.101 kg (229 lb 8 oz)    SpO2: 95% 95% 95% 94%    Wt Readings from Last 3 Encounters:  08/20/13 104.101 kg (229 lb 8 oz)  05/05/13 100.426 kg (221 lb 6.4 oz)  04/22/13 99.292 kg (218 lb 14.4 oz)     Intake/Output Summary (Last 24 hours) at 08/21/13 1213 Last data filed at 08/21/13 0945  Gross per 24 hour  Intake   2310 ml  Output   1300 ml  Net   1010 ml    Exam  General: Well developed, well nourished, NAD, appears stated age  HEENT: NCAT, PERRLA, EOMI, Anicteic Sclera, mucous membranes dry.   Neck: Supple, no JVD, no masses  Cardiovascular: S1 S2 auscultated, no rubs, murmurs or gallops. Regular rate and rhythm.  Respiratory: Clear to auscultation bilaterally with equal chest rise  Abdomen: Soft, nontender, nondistended, + bowel sounds  Extremities: warm dry without cyanosis clubbing of LLE.  Cellulitis marked on the LLE, improving.  L thigh shows indurated area, warm to touch, tenderness to palpation.    Neuro: AAOx3, cranial nerves grossly intact.  Skin: Without rashes exudates or nodules, cellulitis as noted above.  Psych: Normal affect and demeanor with intact judgement and insight  Data Review   Micro Results Recent Results (from the past 240 hour(s))  MRSA PCR SCREENING     Status: None   Collection Time    08/16/13  4:22 AM      Result Value Range Status   MRSA by PCR NEGATIVE  NEGATIVE Final   Comment:            The GeneXpert MRSA Assay (FDA     approved for NASAL specimens     only), is one  component of a     comprehensive MRSA colonization     surveillance program. It is not     intended to diagnose MRSA     infection nor to guide or     monitor treatment for     MRSA infections.  CULTURE, BLOOD (ROUTINE X 2)     Status: None   Collection Time    08/16/13 11:00 AM      Result Value Range Status   Specimen Description BLOOD RIGHT ARM   Final   Special Requests BOTTLES DRAWN AEROBIC AND ANAEROBIC 10CC   Final   Culture  Setup Time     Final   Value: 08/16/2013 18:50     Performed at [facility]   Culture     Final   Value:        BLOOD CULTURE RECEIVED NO GROWTH TO DATE CULTURE WILL BE HELD FOR 5 DAYS BEFORE ISSUING A FINAL NEGATIVE REPORT     Performed at [facility]   Report Status PENDING   Incomplete  CULTURE, BLOOD (ROUTINE X 2)     Status: None   Collection Time    08/16/13 11:15 AM      Result Value Range Status   Specimen Description BLOOD RIGHT ARM   Final   Special Requests BOTTLES DRAWN AEROBIC AND ANAEROBIC 10CC   Final   Culture  Setup Time     Final   Value: 08/16/2013 18:50     Performed at [facility]   Culture     Final   Value:        BLOOD CULTURE RECEIVED NO GROWTH TO DATE CULTURE WILL BE HELD FOR 5 DAYS BEFORE ISSUING A FINAL NEGATIVE REPORT     Performed at [facility]   Report Status PENDING   Incomplete  URINE CULTURE     Status:  None   Collection Time    08/19/13 12:25 PM      Result Value Range Status   Specimen Description URINE, CATHETERIZED   Final   Special Requests PT ON CEFTRIAXONE VANCO   Final   Culture  Setup Time     Final   Value: 08/19/2013 17:57     Performed at [facility]   Colony Count     Final   Value: NO GROWTH     Performed at [facility]   Culture     Final   Value: NO GROWTH     Performed at [facility]   Report Status 08/20/2013 FINAL   Final    Radiology Reports Ct Abdomen Pelvis Wo Contrast  08/16/2013   CLINICAL DATA:  Abdominal pain.   EXAM: CT ABDOMEN AND PELVIS WITHOUT CONTRAST  TECHNIQUE: Multidetector CT imaging of the abdomen and pelvis was performed following the standard protocol without intravenous contrast.  COMPARISON:  CT of the left femur performed 08/15/2013, and CT of the pelvis performed 01/23/2013  FINDINGS: The visualized lung bases are clear.  The liver and spleen are unremarkable in appearance. The gallbladder is within normal limits. The pancreas and left adrenal gland are unremarkable. A 1.2 cm right adrenal lesion is noted, too high in attenuation to characterize as an adrenal adenoma on this study.  Residual contrast is seen within the renal calyces. Nonspecific perinephric stranding is noted bilaterally. A small 1.0 cm hypodensity at the lower pole of the right kidney may reflect a small cyst. There is no evidence of hydronephrosis. No definite obstructing ureteral stones are seen.  No free fluid is identified. The small bowel is unremarkable in appearance. The stomach is within normal limits. No acute vascular abnormalities are seen.  The appendix is normal in caliber and contains air, without evidence for appendicitis. The colon is unremarkable in appearance.  The bladder is largely filled with contrast. The previously noted debris and air in the bladder are displaced by contrast. The amount of debris seems decreased from the recent prior study; some of the debris may have been passed in the urine. Scattered tiny foci of air are again seen outside the bladder wall; the appearance raises suspicion for emphysematous cystitis. The uterus is unremarkable in appearance. The ovaries are relatively symmetric; no suspicious adnexal masses are seen. Enlarged left inguinal nodes are again seen.  No acute osseous abnormalities are identified.  IMPRESSION: 1. Previously noted debris and air in the bladder are displaced by contrast. The amount of debris appears decreased from the recent prior study; some of the debris may have been  passed in the urine. Scattered tiny foci of air again seen outside the bladder wall; the appearance raises suspicion for emphysematous cystitis. 2. Small right renal cyst. 3. 1.2 cm right adrenal lesion, too high in attenuation to characterize as an adrenal adenoma on this study. Adrenal protocol CT or MRI could be considered for further evaluation, when and as deemed clinically appropriate. 4. Enlarged left inguinal nodes again seen.   Electronically Signed   By: Roanna Raider M.D.   On: 08/16/2013 03:51   Ct Femur Left W Contrast  08/16/2013   CLINICAL DATA:  Left thigh swelling and erythema, with left lateral thigh pain just above the knee.  EXAM: CT OF THE LEFT FEMUR WITH CONTRAST  TECHNIQUE: Multidetector CT imaging of the left femur was performed according to the standard protocol following intravenous contrast administration. Multiplanar CT image reconstructions  were also generated.  CONTRAST:  100mL OMNIPAQUE IOHEXOL 300 MG/ML  SOLN  COMPARISON:  Left knee radiographs performed 01/16/2006, and CT of the pelvis performed 01/23/2013  FINDINGS: There is diffuse soft tissue inflammation and edema along the lateral aspect of the left thigh, extending inferiorly below the left knee. Associated significant skin thickening is seen. Findings are most compatible with cellulitis. There is no evidence of underlying abscess at this time. There is minimal associated soft tissue edema underlying the fascia, though the underlying musculature is generally intact.  There is no evidence of vascular compromise. Very small knee joint effusion is noted. The knee joint is otherwise grossly unremarkable in appearance. Enlarged left inguinal nodes are seen, measuring up to 1.6 cm in short axis. Scattered prominent collateral vessels are noted along the visualized anterior abdominal wall.  The left hip joint is unremarkable in appearance. No hip joint effusion is identified. The visualized portions of the uterus are grossly  unremarkable. Note is made of a large amount of debris and solid material filling the bladder, with trace air noted outside of the bladder. This appears new from the CT of the pelvis performed 01/23/2013.  No acute osseous abnormalities are identified.  IMPRESSION: 1. Diffuse soft tissue inflammation and edema along the lateral aspect of the left thigh, extending inferiorly below the left knee, with associated skin thickening, compatible with cellulitis. No evidence of underlying abscess at this time. Minimal associated soft tissue edema seen underlying the fascia, though the underlying musculature is grossly intact. 2. There is small knee joint effusion noted. 3. Left inguinal lymphadenopathy seen, with nodes measuring up to 1.6 cm in short axis. 4. Prominent collateral vessels noted along the anterior abdominal wall. 5. Large amount of debris and solid material filling the bladder, with trace air noted outside the bladder. This is new from the CT of the pelvis performed 01/23/2013. This is a very unusual appearance, and could conceivably reflect a colovesical fistula, or possibly severe emphysematous cystitis. Would correlate for associated symptoms.  These results were called by telephone at the time of interpretation on 08/16/2013 at 1:27 AM to Dr. Eber HongBRIAN MILLER , who verbally acknowledged these results.   Electronically Signed   By: Roanna RaiderJeffery  Chang M.D.   On: 08/16/2013 01:29    CBC  Recent Labs Lab 08/15/13 2256  08/17/13 1030 08/18/13 0642 08/19/13 0735 08/20/13 0925 08/21/13 0455  WBC 15.0*  < > 14.2* 15.1* 12.8* 11.5* 11.3*  HGB 12.2  < > 11.5* 10.7* 9.3* 8.4* 8.2*  HCT 34.9*  < > 34.2* 31.8* 27.4* 25.2* 24.8*  PLT 243  < > 230 286 261 286 306  MCV 82.9  < > 84.9 82.8 82.5 84.0 84.6  MCH 29.0  < > 28.5 27.9 28.0 28.0 28.0  MCHC 35.0  < > 33.6 33.6 33.9 33.3 33.1  RDW 13.3  < > 13.7 13.9 14.1 14.2 14.3  LYMPHSABS 2.0  --   --   --   --   --   --   MONOABS 1.2*  --   --   --   --   --   --    EOSABS 0.0  --   --   --   --   --   --   BASOSABS 0.0  --   --   --   --   --   --   < > = values in this interval not displayed.  Chemistries   Recent Labs Lab  08/15/13 2256  08/17/13 1030 08/18/13 0642 08/19/13 0735 08/20/13 1140 08/21/13 0455  NA 125*  < > 128* 127* 130* 141 139  K 3.9  < > 3.9 5.4* 5.4* 4.4 4.3  CL 86*  < > 90* 89* 90* 100 100  CO2 29  < > 22 19 21 25 23   GLUCOSE 379*  < > 237* 343* 222* 68* 79  BUN 23  < > 32* 43* 53* 44* 43*  CREATININE 1.50*  < > 3.38* 5.62* 6.60* 3.77* 2.98*  CALCIUM 8.6  < > 8.8 9.5 9.0 8.8 8.9  AST 8  --  12  --   --   --   --   ALT 9  --  9  --   --   --   --   ALKPHOS 90  --  127*  --   --   --   --   BILITOT 0.4  --  0.2*  --   --   --   --   < > = values in this interval not displayed. ------------------------------------------------------------------------------------------------------------------ estimated creatinine clearance is 26.4 ml/min (by C-G formula based on Cr of 2.98). ------------------------------------------------------------------------------------------------------------------ No results found for this basename: HGBA1C,  in the last 72 hours ------------------------------------------------------------------------------------------------------------------ No results found for this basename: CHOL, HDL, LDLCALC, TRIG, CHOLHDL, LDLDIRECT,  in the last 72 hours ------------------------------------------------------------------------------------------------------------------  Recent Labs  08/18/13 1300  TSH 4.222   ------------------------------------------------------------------------------------------------------------------ No results found for this basename: VITAMINB12, FOLATE, FERRITIN, TIBC, IRON, RETICCTPCT,  in the last 72 hours  Coagulation profile No results found for this basename: INR, PROTIME,  in the last 168 hours  No results found for this basename: DDIMER,  in the last 72 hours  Cardiac  Enzymes No results found for this basename: CK, CKMB, TROPONINI, MYOGLOBIN,  in the last 168 hours ------------------------------------------------------------------------------------------------------------------ No components found with this basename: POCBNP,

## 2013-08-21 NOTE — Progress Notes (Signed)
Nichole Poag, MD Physician Signed Nephrology Progress Notes Service date: 08/20/2013 1:24 PM   1 Acute Kidney Injury . Given response coincident with foley catheter placement and improvement in sCreat I am suspicious of outlet obstruction as cause.  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 with hx of chronic urinary incontinence s/p foley by GU  Plan:  Continue foley drainage, antibiotics and bladder treatment per urology We will sign off   Subjective: Interval History: Foley draining 3900 cc UOP last 24 hours after foley  Objective: Vital signs in last 24 hours: Temp:  [98.1 F (36.7 C)-102.2 F (39 C)] 98.6 F (37 C) (01/01 0639) Pulse Rate:  [66-82] 69 (01/01 0639) Resp:  [16-20] 16 (01/01 0639) BP: (118-157)/(41-64) 147/64 mmHg (01/01 0639) SpO2:  [95 %-96 %] 95 % (01/01 0639) Weight:  [104.101 kg (229 lb 8 oz)] 104.101 kg (229 lb 8 oz) (12/31 2229) Weight change: 2.041 kg (4 lb 8 oz)  Intake/Output from previous day: 12/31 0701 - 01/01 0700 In: 1340 [P.O.:990; IV Piggyback:50] Out: 3950 [Urine:3950] Intake/Output this shift:    General appearance: currently sleeping after being medicatred last PM according to her mother  Lab Results:  Recent Labs  08/20/13 0925 08/21/13 0455  WBC 11.5* 11.3*  HGB 8.4* 8.2*  HCT 25.2* 24.8*  PLT 286 306   BMET:  Recent Labs  08/20/13 1140 08/21/13 0455  NA 141 139  K 4.4 4.3  CL 100 100  CO2 25 23  GLUCOSE 68* 79  BUN 44* 43*  CREATININE 3.77* 2.98*  CALCIUM 8.8 8.9   No results found for this basename: PTH,  in the last 72 hours Iron Studies: No results found for this basename: IRON, TIBC, TRANSFERRIN, FERRITIN,  in the last 72 hours Studies/Results: Ct Head Wo Contrast  08/20/2013   CLINICAL DATA:  Altered mental status.  EXAM: CT HEAD WITHOUT CONTRAST  TECHNIQUE: Contiguous axial images were obtained from the base of the skull through the vertex without  intravenous contrast.  COMPARISON:  10/06/2011 CT.  05/03/2011 MR.  FINDINGS: No intracranial hemorrhage.  No CT evidence of large acute infarct  No intracranial mass lesion noted on this unenhanced exam.  Calcification adjacent to the superior aspect of the falx unchanged and probably an incidental finding rather than and a meningioma.  Mastoid air cells, middle ear cavities and visualized paranasal sinuses are clear. Very mild exophthalmos.  IMPRESSION: No acute abnormality.  Please see above.   Electronically Signed   By: Bridgett Larsson M.D.   On: 08/20/2013 11:44   Scheduled: . aspirin EC  81 mg Oral Daily  . atorvastatin  80 mg Oral Daily  . cefTRIAXone (ROCEPHIN)  IV  1 g Intravenous Q24H  . docusate sodium  100 mg Oral BID  . feeding supplement (GLUCERNA SHAKE)  237 mL Oral Daily  . FLUoxetine  20 mg Oral BID  . Fluticasone Furoate-Vilanterol  1 puff Inhalation Daily  . gabapentin  100 mg Oral BID  . heparin  5,000 Units Subcutaneous Q8H  . insulin aspart  0-15 Units Subcutaneous TID WC  . insulin aspart  0-5 Units Subcutaneous QHS  . insulin aspart  20 Units Subcutaneous TID WC  . insulin detemir  140 Units Subcutaneous Daily  . levothyroxine  25 mcg Oral QAC breakfast  . metoCLOPramide  5 mg Oral TID  . multivitamin with minerals  1 tablet Oral Daily  . oxybutynin  5 mg Oral  QAC breakfast  . pantoprazole  40 mg Oral Daily  . tiotropium  18 mcg Inhalation Daily     LOS: 6 days   Reno Clasby C 08/21/2013,7:28 AM

## 2013-08-21 NOTE — Progress Notes (Signed)
Hypoglycemic Event  CBG: 59  Treatment: 15 GM carbohydrate snack  Symptoms: Pale, Sweaty, Nervous/irritable and lethargic  Follow-up CBG: Time:0900 CBG Result:139  Possible Reasons for Event: Inadequate meal intake  Comments/MD notified:no     [name] D  Remember to initiate Hypoglycemia Order Set & complete

## 2013-08-22 DIAGNOSIS — F17200 Nicotine dependence, unspecified, uncomplicated: Secondary | ICD-10-CM

## 2013-08-22 LAB — BLOOD GAS, ARTERIAL
Acid-Base Excess: 2.1 mmol/L — ABNORMAL HIGH (ref 0.0–2.0)
Bicarbonate: 25.9 mEq/L — ABNORMAL HIGH (ref 20.0–24.0)
DRAWN BY: 225631
FIO2: 0.21 %
O2 SAT: 92 %
PATIENT TEMPERATURE: 98.6
PH ART: 7.444 (ref 7.350–7.450)
TCO2: 27 mmol/L (ref 0–100)
pCO2 arterial: 38.3 mmHg (ref 35.0–45.0)
pO2, Arterial: 67 mmHg — ABNORMAL LOW (ref 80.0–100.0)

## 2013-08-22 LAB — BASIC METABOLIC PANEL
BUN: 40 mg/dL — ABNORMAL HIGH (ref 6–23)
CALCIUM: 8.7 mg/dL (ref 8.4–10.5)
CO2: 27 mEq/L (ref 19–32)
Chloride: 99 mEq/L (ref 96–112)
Creatinine, Ser: 2.06 mg/dL — ABNORMAL HIGH (ref 0.50–1.10)
GFR calc Af Amer: 31 mL/min — ABNORMAL LOW (ref >90)
GFR, EST NON AFRICAN AMERICAN: 27 mL/min — AB (ref >90)
GLUCOSE: 124 mg/dL — AB (ref 70–99)
Potassium: 4.6 mEq/L (ref 3.7–5.3)
Sodium: 141 mEq/L (ref 137–147)

## 2013-08-22 LAB — IRON AND TIBC
Iron: 11 ug/dL — ABNORMAL LOW (ref 42–135)
SATURATION RATIOS: 8 % — AB (ref 20–55)
TIBC: 133 ug/dL — ABNORMAL LOW (ref 250–470)
UIBC: 122 ug/dL — AB (ref 125–400)

## 2013-08-22 LAB — CBC
HCT: 22.6 % — ABNORMAL LOW (ref 36.0–46.0)
Hemoglobin: 7.6 g/dL — ABNORMAL LOW (ref 12.0–15.0)
MCH: 28.4 pg (ref 26.0–34.0)
MCHC: 33.6 g/dL (ref 30.0–36.0)
MCV: 84.3 fL (ref 78.0–100.0)
PLATELETS: 305 10*3/uL (ref 150–400)
RBC: 2.68 MIL/uL — ABNORMAL LOW (ref 3.87–5.11)
RDW: 14.2 % (ref 11.5–15.5)
WBC: 7.8 10*3/uL (ref 4.0–10.5)

## 2013-08-22 LAB — CULTURE, BLOOD (ROUTINE X 2)
Culture: NO GROWTH
Culture: NO GROWTH

## 2013-08-22 LAB — GLUCOSE, CAPILLARY
Glucose-Capillary: 124 mg/dL — ABNORMAL HIGH (ref 70–99)
Glucose-Capillary: 245 mg/dL — ABNORMAL HIGH (ref 70–99)
Glucose-Capillary: 247 mg/dL — ABNORMAL HIGH (ref 70–99)
Glucose-Capillary: 163 mg/dL — ABNORMAL HIGH (ref 70–99)

## 2013-08-22 LAB — FOLATE: Folate: 13.2 ng/mL

## 2013-08-22 LAB — VANCOMYCIN, RANDOM: VANCOMYCIN RM: 10.7 ug/mL

## 2013-08-22 LAB — FERRITIN: FERRITIN: 591 ng/mL — AB (ref 10–291)

## 2013-08-22 LAB — VITAMIN B12: Vitamin B-12: 787 pg/mL (ref 211–911)

## 2013-08-22 MED ORDER — BUDESONIDE-FORMOTEROL FUMARATE 160-4.5 MCG/ACT IN AERO
2.0000 | INHALATION_SPRAY | Freq: Two times a day (BID) | RESPIRATORY_TRACT | Status: DC
Start: 2013-08-22 — End: 2013-08-28
  Administered 2013-08-22 – 2013-08-28 (×11): 2 via RESPIRATORY_TRACT
  Filled 2013-08-22 (×2): qty 6

## 2013-08-22 MED ORDER — HALOPERIDOL LACTATE 5 MG/ML IJ SOLN
1.0000 mg | Freq: Four times a day (QID) | INTRAMUSCULAR | Status: DC | PRN
  Administered 2013-08-22: 1 mg via INTRAVENOUS
  Filled 2013-08-22 (×2): qty 1

## 2013-08-22 MED ORDER — VANCOMYCIN HCL IN DEXTROSE 1-5 GM/200ML-% IV SOLN
1000.0000 mg | Freq: Once | INTRAVENOUS | Status: AC
  Administered 2013-08-22: 1000 mg via INTRAVENOUS
  Filled 2013-08-22: qty 200

## 2013-08-22 MED ORDER — SODIUM CHLORIDE 0.9 % IV SOLN
25.0000 mg | Freq: Once | INTRAVENOUS | Status: AC
  Administered 2013-08-22: 25 mg via INTRAVENOUS
  Filled 2013-08-22 (×2): qty 2

## 2013-08-22 MED ORDER — NA FERRIC GLUC CPLX IN SUCROSE 12.5 MG/ML IV SOLN
250.0000 mg | Freq: Once | INTRAVENOUS | Status: AC
  Administered 2013-08-22: 250 mg via INTRAVENOUS
  Filled 2013-08-22 (×3): qty 20

## 2013-08-22 NOTE — Progress Notes (Signed)
Bladder scan done to see if foley had good output, <16 ml detected. Pt had increased urinary output with movement and ambulation. Pt had bowel movement but family disposed of sample. Hemoccult pending. Will continue to monitor.

## 2013-08-22 NOTE — Progress Notes (Signed)
Pt unable to sleep throughout the night and becoming increasingly restless, speaking in nonsensical terms, and touching her feces. No PRN medication for agitation at this time. Myna Hidalgo MD notified. Per MD, new orders will be placed. Will continue to monitor. Gilman Schmidt

## 2013-08-22 NOTE — Progress Notes (Signed)
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 Has been on ABX since 12/27, new UCX obtained (12/30) and negative. NO h/o inflammatory bowel disease, diverticulosis/diverticulitis, or pelvic radiation. No baseline voiding complaints. Foley placed 12/30 for maximal drainage and decrease bladder wall tension while healing.  2 - Acute Renal Failure - Pt with acute rise in Cr to 5.6 range from baseline 1.5 during admission. CT w/o hydro / stones / or other s/s obstruction. She has received IV contrast, IV ABX including Vancomycin, and suspect SIRS. Peak Cr >5 and now trending back down.   Today [name] is seen in cf/u above. She c/o skin pain from her left leg cellulitis as well as some expected irrigation from her catheter. Her level of conciousness appeara to be waxing / waning, she is very lucid / logical / and pleasant today.   Objective: Vital signs in last 24 hours: Temp:  [97.7 F (36.5 C)-98.6 F (37 C)] 97.9 F (36.6 C) (01/02 0436) Pulse Rate:  [58-75] 58 (01/02 0436) Resp:  [16-22] 16 (01/02 0436) BP: (119-150)/(46-70) 150/70 mmHg (01/02 0436) SpO2:  [86 %-100 %] 86 % (01/02 0721) Weight:  [104.101 kg (229 lb 8 oz)] 104.101 kg (229 lb 8 oz) (01/01 2042) Last BM Date: 08/22/13  Intake/Output from previous day: 01/01 0701 - 01/02 0700 In: 2370 [P.O.:2370] Out: 1300 [Urine:1300] Intake/Output this shift: Total I/O In: 360 [P.O.:360] Out: -   General appearance: alert, cooperative and appears older than stated age Head: Normocephalic, without obvious abnormality, atraumatic Eyes: conjunctivae/corneas clear. PERRL, EOM's intact. Fundi benign. Ears: normal TM's and external ear canals both ears Nose: Nares normal. Septum midline. Mucosa normal. No drainage or sinus tenderness. Throat: poor  dentition Neck: no adenopathy, no carotid bruit, no JVD, supple, symmetrical, trachea midline and thyroid not enlarged, symmetric, no tenderness/mass/nodules Back: symmetric, no curvature. ROM normal. No CVA tenderness. Resp: clear to auscultation bilaterally Chest wall: no tenderness Cardio: regular rate and rhythm, S1, S2 normal, no murmur, click, rub or gallop GI: soft, non-tender; bowel sounds normal; no masses,  no organomegaly Female genitalia: foley c/d/i with thick, cloudy urine. No obvious enteric contents.  Extremities: extremities normal, atraumatic, no cyanosis or edema and RLE s/p BKA. LLE with area of erythema that appears improved to recetn prior. No crepitus. No extension to perineum / buttocks. Pulses: 2+ and symmetric Skin: Skin color, texture, turgor normal. No rashes or lesions Lymph nodes: Cervical, supraclavicular, and axillary nodes normal. Neurologic: Grossly normal  Lab Results:   Recent Labs  08/21/13 0455 08/22/13 0500  WBC 11.3* 7.8  HGB 8.2* 7.6*  HCT 24.8* 22.6*  PLT 306 305   BMET  Recent Labs  08/20/13 1140 08/21/13 0455  NA 141 139  K 4.4 4.3  CL 100 100  CO2 25 23  GLUCOSE 68* 79  BUN 44* 43*  CREATININE 3.77* 2.98*  CALCIUM 8.8 8.9   PT/INR No results found for this basename: LABPROT, INR,  in the last 72 hours ABG  Recent Labs  08/19/13 1930  PHART 7.270*  HCO3 22.4    Studies/Results: Ct Head Wo Contrast  08/20/2013   CLINICAL DATA:  Altered mental status.  EXAM: CT HEAD WITHOUT CONTRAST  TECHNIQUE: Contiguous axial images were obtained from the base of the skull through the vertex without intravenous contrast.  COMPARISON:  10/06/2011  CT.  05/03/2011 MR.  FINDINGS: No intracranial hemorrhage.  No CT evidence of large acute infarct  No intracranial mass lesion noted on this unenhanced exam.  Calcification adjacent to the superior aspect of the falx unchanged and probably an incidental finding rather than and a meningioma.   Mastoid air cells, middle ear cavities and visualized paranasal sinuses are clear. Very mild exophthalmos.  IMPRESSION: No acute abnormality.  Please see above.   Electronically Signed   By: Bridgett Larsson M.D.   On: 08/20/2013 11:44    Anti-infectives: Anti-infectives   Start     Dose/Rate Route Frequency Ordered Stop   08/22/13 0915  vancomycin (VANCOCIN) IVPB 1000 mg/200 mL premix     1,000 mg 200 mL/hr over 60 Minutes Intravenous  Once 08/22/13 0905     08/20/13 1100  vancomycin (VANCOCIN) IVPB 750 mg/150 ml premix     750 mg 150 mL/hr over 60 Minutes Intravenous  Once 08/20/13 1059 08/20/13 1407   08/19/13 1800  vancomycin (VANCOCIN) 1,500 mg in sodium chloride 0.9 % 500 mL IVPB  Status:  Discontinued     1,500 mg 250 mL/hr over 120 Minutes Intravenous Every 48 hours 08/17/13 1800 08/17/13 1804   08/17/13 1900  vancomycin (VANCOCIN) 1,500 mg in sodium chloride 0.9 % 500 mL IVPB  Status:  Discontinued     1,500 mg 250 mL/hr over 120 Minutes Intravenous Every 48 hours 08/17/13 1804 08/18/13 1129   08/17/13 1830  vancomycin (VANCOCIN) 2,000 mg in sodium chloride 0.9 % 500 mL IVPB  Status:  Discontinued     2,000 mg 250 mL/hr over 120 Minutes Intravenous  Once 08/17/13 1759 08/17/13 1802   08/16/13 1200  vancomycin (VANCOCIN) IVPB 1000 mg/200 mL premix  Status:  Discontinued     1,000 mg 200 mL/hr over 60 Minutes Intravenous Every 12 hours 08/16/13 0344 08/16/13 0819   08/16/13 0900  cefTRIAXone (ROCEPHIN) 1 g in dextrose 5 % 50 mL IVPB     1 g 100 mL/hr over 30 Minutes Intravenous Every 24 hours 08/16/13 0819     08/16/13 0300  ertapenem (INVANZ) 1 g in sodium chloride 0.9 % 50 mL IVPB     1 g 100 mL/hr over 30 Minutes Intravenous  Once 08/16/13 0236 08/16/13 0349   08/15/13 2315  vancomycin (VANCOCIN) IVPB 1000 mg/200 mL premix     1,000 mg 200 mL/hr over 60 Minutes Intravenous  Once 08/15/13 2302 08/16/13 0316      Assessment/Plan:  1 - Emphysematous Cystitis - impressive  case, likely due to underlying hyperglysemia / glycosuria. Rec continue catheter until systemic infectious parameters and need for close UOP monitoring subsided, as well as at least 2 weeks CX-specifc ABX. Should current UCX remain negative, would then likely continue cephalosporin or similar or PO equivalent for course. I do not favor any sort of GI-GU fistula given gas pattern and lack of risk-factors for such.   2 - Acute Renal Failure - Low suspicion for obstructive etiology given lack of hydro, though possible. Suspect acute medial renal stress from contrast, hypotension, ABX as likely cause.   No indication for GU operative intervention. Will continue to follow prn while in house. Please call anytime with questions.    [facility], Yaquelin Langelier 08/22/2013

## 2013-08-22 NOTE — Progress Notes (Signed)
Physical Therapy Treatment Patient Details Name: [name] MRN: [id number] DOB: 1960/10/08 Today's Date: 08/22/2013 Time: 1333-1400 PT Time Calculation (min): 27 min  PT Assessment / Plan / Recommendation  History of Present Illness Patient is a 53 yo female admitted with 9 day history of LLE edema and reddness following a fall.  Patient dx with cellulitis.  Patient with h/o DM with neuropathy, Rt BKA with prosthesis, HTN, PVD, CHF, anxiety.   PT Comments   Pt able to ambulate today with therapy. Pt much more alert and oriented. Pt adamant about D/C home vs. Rehab facility. Pt will have family members checking in on her intermittently. Would benefit from [facility] Aide for safe transition home. Pt also to benefit from OPPT to increase mobility and reduce need for caregiver at home. Will cont to follow per POC. Recommend OT evaluation to assess need for (A) with ADLs.    Follow Up Recommendations  Outpatient PT;Supervision - Intermittent;Supervision for mobility/OOB;Other (comment) (would benefit from [facility] aide )     Does the patient have the potential to tolerate intense rehabilitation     Barriers to Discharge        Equipment Recommendations  Rolling walker with 5" wheels    Recommendations for Other Services OT consult  Frequency Min 3X/week   Progress towards PT Goals Progress towards PT goals: Progressing toward goals  Plan Discharge plan needs to be updated    Precautions / Restrictions Precautions Precautions: Fall Required Braces or Orthoses: Other Brace/Splint Other Brace/Splint: Rt LE prosthesis   Restrictions Weight Bearing Restrictions: No   Pertinent Vitals/Pain Sitting EOB on RA pt at 93%; ambulating on RA pt desat to 85% but recovered to 90% with deep breathing technique    Mobility  Bed Mobility Bed Mobility: Supine to Sit;Sitting - Scoot to Edge of Bed;Sit to Supine Supine to Sit: 4: Min assist;HOB flat;With rails Sitting - Scoot to Edge of Bed: 5:  Supervision;With rail Sit to Supine: 5: Supervision;HOB flat;With rail Details for Bed Mobility Assistance: pt required min (A) to bring trunk to upright sitting position; cues for hand placement and sequencing; requires incr time for all bed mobility due to generalized weakness and pain  Transfers Transfers: Sit to Stand;Stand to Sit Sit to Stand: 4: Min guard;From bed Stand to Sit: 4: Min guard;To bed Details for Transfer Assistance: min guard to steady and for balance with transfers; cues for hand placement and sequencing  Ambulation/Gait Ambulation/Gait Assistance: 4: Min guard Ambulation Distance (Feet): 60 Feet Assistive device: Rolling walker Ambulation/Gait Assistance Details: pt amb on RA and desat to 86% while conversing; cues for deep breathing and pt would return to 89% on RA; cues for sequencing with RW; min guard to steady and for safety  Gait Pattern: Step-to pattern;Antalgic;Narrow base of support Gait velocity: decreased due to pain and decr activity tolerance  General Gait Details: c/o pain in Lt Hip Stairs: No Wheelchair Mobility Wheelchair Mobility: No         PT Diagnosis:    PT Problem List:   PT Treatment Interventions:     PT Goals (current goals can now be found in the care plan section) Acute Rehab PT Goals Patient Stated Goal: to not go to rehab  PT Goal Formulation: With patient Time For Goal Achievement: 08/23/13 Potential to Achieve Goals: Good  Visit Information  Last PT Received On: 08/22/13 Assistance Needed: +1 History of Present Illness: Patient is a 53 yo female admitted with 9 day history of LLE  edema and reddness following a fall.  Patient dx with cellulitis.  Patient with h/o DM with neuropathy, Rt BKA with prosthesis, HTN, PVD, CHF, anxiety.    Subjective Data  Subjective: pt lying supine with family and nurse present; agreeable to therapy. pt much more alert and oriented today. "i just want to do what i need to do to go home. i dont want  to go to no stinking rehab"  Patient Stated Goal: to not go to rehab    Cognition  Cognition Arousal/Alertness: Awake/alert Behavior During Therapy: WFL for tasks assessed/performed Overall Cognitive Status: Within Functional Limits for tasks assessed    Balance  Balance Balance Assessed: Yes Static Sitting Balance Static Sitting - Balance Support: Feet supported;Feet unsupported;No upper extremity supported Static Sitting - Level of Assistance: 5: Stand by assistance Dynamic Sitting Balance Dynamic Sitting - Balance Support: Feet supported;Feet unsupported;During functional activity;No upper extremity supported Dynamic Sitting - Level of Assistance: 5: Stand by assistance Dynamic Sitting - Balance Activities: Reaching for objects;Lateral lean/weight shifting;Forward lean/weight shifting Dynamic Sitting - Comments: pt tolerated sitting EOB to donn/doff prosthesis ~9 min total   End of Session PT - End of Session Equipment Utilized During Treatment: Gait belt;Oxygen (on intially and removed per RN) Activity Tolerance: Patient tolerated treatment well Patient left: in bed;with call bell/phone within reach;with nursing/sitter in room;with family/visitor present Nurse Communication: Mobility status;Precautions   GP     Donell Sievert, [place] [phone] 08/22/2013, 3:10 PM

## 2013-08-22 NOTE — Progress Notes (Signed)
ANTIBIOTIC CONSULT NOTE - Follow-Up  Pharmacy Consult for Vancomycin Indication: Cellulitis   Allergies  Allergen Reactions  . Gadolinium      Code: VOM, Desc: Pt began vomiting immed post infusion of multihance, Onset Date: [id number]   . Ivp Dye [Iodinated Diagnostic Agents] Nausea And Vomiting  . Metformin Other (See Comments)     diarrhea  . Penicillins Hives    Patient Measurements: Height: 5\' 5"  (165.1 cm) Weight: 229 lb 8 oz (104.101 kg) IBW/kg (Calculated) : 57 Adjusted Body Weight: n/a  Vital Signs: Temp: 97.3 F (36.3 C) (01/02 0900) Temp src: Oral (01/02 0900) BP: 172/59 mmHg (01/02 0900) Pulse Rate: 58 (01/02 0900) Intake/Output from previous day: 01/01 0701 - 01/02 0700 In: 2370 [P.O.:2370] Out: 1300 [Urine:1300] Intake/Output from this shift: Total I/O In: 480 [P.O.:480] Out: -   Labs:  Recent Labs  08/19/13 1225 08/20/13 0925 08/20/13 1140 08/21/13 0455 08/22/13 0500  WBC  --  11.5*  --  11.3* 7.8  HGB  --  8.4*  --  8.2* 7.6*  PLT  --  286  --  306 305  LABCREA 183.08  --   --   --   --   CREATININE  --   --  3.77* 2.98* 2.06*   Estimated Creatinine Clearance: 38.2 ml/min (by C-G formula based on Cr of 2.06).  Recent Labs  08/20/13 0600 08/22/13 0500  VANCORANDOM 15.0 10.7     Microbiology: Recent Results (from the past 720 hour(s))  MRSA PCR SCREENING     Status: None   Collection Time    08/16/13  4:22 AM      Result Value Range Status   MRSA by PCR NEGATIVE  NEGATIVE Final   Comment:            The GeneXpert MRSA Assay (FDA     approved for NASAL specimens     only), is one component of a     comprehensive MRSA colonization     surveillance program. It is not     intended to diagnose MRSA     infection nor to guide or     monitor treatment for     MRSA infections.  CULTURE, BLOOD (ROUTINE X 2)     Status: None   Collection Time    08/16/13 11:00 AM      Result Value Range Status   Specimen Description BLOOD RIGHT ARM    Final   Special Requests BOTTLES DRAWN AEROBIC AND ANAEROBIC 10CC   Final   Culture  Setup Time     Final   Value: 08/16/2013 18:50     Performed at [facility]   Culture     Final   Value: NO GROWTH 5 DAYS     Performed at [facility]   Report Status 08/22/2013 FINAL   Final  CULTURE, BLOOD (ROUTINE X 2)     Status: None   Collection Time    08/16/13 11:15 AM      Result Value Range Status   Specimen Description BLOOD RIGHT ARM   Final   Special Requests BOTTLES DRAWN AEROBIC AND ANAEROBIC 10CC   Final   Culture  Setup Time     Final   Value: 08/16/2013 18:50     Performed at [facility]   Culture     Final   Value: NO GROWTH 5 DAYS     Performed at [facility]   Report Status 08/22/2013  FINAL   Final  URINE CULTURE     Status: None   Collection Time    08/19/13 12:25 PM      Result Value Range Status   Specimen Description URINE, CATHETERIZED   Final   Special Requests PT ON CEFTRIAXONE VANCO   Final   Culture  Setup Time     Final   Value: 08/19/2013 17:57     Performed at [facility] Count     Final   Value: NO GROWTH     Performed at [facility]   Culture     Final   Value: NO GROWTH     Performed at [facility]   Report Status 08/20/2013 FINAL   Final    Medical History: Past Medical History  Diagnosis Date  . Diabetic neuropathy   . Cellulitis   . Thyroid disease   . Hyperlipidemia   . Heart murmur   . Neuromuscular disorder   . Concussion as a teenager    slight  . Diarrhea     pt takes Reglan tid  . Hypothyroidism     takes Synthroid daily  . Depression     takes CYmbalta daily  . Peripheral edema     takes Lasix daily  . MRSA (methicillin resistant staph aureus) culture positive     hx of 2012  . Staph infection 2007  . Complication of anesthesia 01/2013    "didn't know where I was; who I was; talking out the top of my head" (2013-04-24)  . Hypertension   . Peripheral  vascular disease   . CHF (congestive heart failure)   . Pneumonia 1980's    "hospitalized w/it" (04-24-2013)  . Chronic bronchitis   . Orthopnea     "just one; yesterday morning" (04-24-2013)  . Type II diabetes mellitus     Novolog,Levemir,and Lovaza daily  . GERD (gastroesophageal reflux disease)   . Daily headache   . Rheumatoid arthritis     "both knees" (2013/04/24)  . Anxiety   . Kidney stone     "passed on before" (04-24-2013)    Medications:  Prescriptions prior to admission  Medication Sig Dispense Refill  . albuterol (PROVENTIL HFA;VENTOLIN HFA) 108 (90 BASE) MCG/ACT inhaler Inhale 2 puffs into the lungs every 6 (six) hours as needed for shortness of breath.  1 Inhaler  2  . aspirin EC 81 MG tablet Take 81 mg by mouth daily.      Marland Kitchen atorvastatin (LIPITOR) 80 MG tablet Take 80 mg by mouth daily.      . clindamycin (CLEOCIN) 300 MG capsule Take 300 mg by mouth 3 (three) times daily. Started on 08-11-13 for 10 days.      Marland Kitchen FLUoxetine (PROZAC) 20 MG tablet Take 20 mg by mouth 2 (two) times daily.       . Fluticasone Furoate-Vilanterol (BREO ELLIPTA) 100-25 MCG/INH AEPB Inhale 1 puff into the lungs daily.      . furosemide (LASIX) 40 MG tablet Take 1 tablet (40 mg total) by mouth daily.  30 tablet  0  . gabapentin (NEURONTIN) 800 MG tablet Take 800 mg by mouth 3 (three) times daily.       . hydrochlorothiazide (MICROZIDE) 12.5 MG capsule Take 12.5 mg by mouth daily.      . insulin aspart (NOVOLOG) 100 UNIT/ML injection Inject 36-48 Units into the skin 3 (three) times daily with meals. *per sliding scale      .  insulin detemir (LEVEMIR) 100 UNIT/ML injection Inject 140 Units into the skin every morning. *uses 70 units on each side      . levothyroxine (SYNTHROID, LEVOTHROID) 25 MCG tablet Take 25 mcg by mouth every morning.       Marland Kitchen lisinopril (PRINIVIL,ZESTRIL) 5 MG tablet Take 5 mg by mouth daily.      . metoCLOPramide (REGLAN) 5 MG tablet Take 5 mg by mouth 3 (three) times daily.        Marland Kitchen nystatin (MYCOSTATIN/NYSTOP) 100000 UNIT/GM POWD Apply topically.      Marland Kitchen omeprazole (PRILOSEC) 20 MG capsule Take 20 mg by mouth daily.      Marland Kitchen oxybutynin (DITROPAN) 5 MG tablet Take 5 mg by mouth every morning.        Marland Kitchen oxyCODONE-acetaminophen (PERCOCET) 10-325 MG per tablet Take 1 tablet by mouth every 4 (four) hours as needed for pain.      Marland Kitchen tiotropium (SPIRIVA) 18 MCG inhalation capsule Place 18 mcg into inhaler and inhale daily.       Assessment: 36 YOF with sepsis secondary to cellulits of left leg. Currently on IV ceftriaxone and IV vancomycin. CT scan does not show any abscess at this time. Blood Cx with no growth to date,wbc trending down.  Is afebrile this AM  Renal function now much improved, Scr down to 2.06.  Vancomycin random level now within limits for redosing.  12/27 CTX>> 12/27 vanc 1g x 1 12/28 vanc 1500 x 1 >  12/30 level 20.1 >  12/31 level 15 > redose 750 mg x 1 1/2 level 10 > redose 1g x 1  12/27 bcx x2>> neg MRSA PCR >> neg 12/30 UCx > neg  Goal of Therapy:  Vancomycin trough level 10-15 mcg/ml  Plan:  1) Vancomycin 1000 mg IV x 1 now. 2) Continue  Ceftriaxone 1 gm IV Q 24 hours  3) Recheck random vancomycin level on Sat AM.  With her changing renal function, it is difficult to predict vancomycin clearance.  Tad Moore, BCPS  Clinical Pharmacist Pager [phone]  08/22/2013 9:59 AM

## 2013-08-22 NOTE — Progress Notes (Signed)
08/22/13 1038 Bernerd Limbo Jamesen Stahnke-Registered Nurse (Nurse)  Lengthy discussion with patient and family about patients mental status and pain medication. Have held sedating medications/narcotics as per family patient had similar episodes of AMS the last time she was here on pain medications. Pt family is concerned for patient safety if patient goes home alone b/c patient lives by herself with no-one to physically check on her throughout the day. Family reports that patient often will not answer calls and will report being "asleep" in the chair all day. Patient states that she is fine on home hydrocodone, but no one present is able to verify if she is able to care for self on home medications. Educated patient on dangers of being on narcotic medications if she is unable to care for self. Will continue to monitor.

## 2013-08-22 NOTE — Progress Notes (Signed)
[facility]                                                                                Patient Demographics  [name], is a 53 y.o. female, DOB - 12-07-1960, XBJ:[id number]  Admit date - 08/15/2013   Admitting Physician Ron Parker, MD  Outpatient Primary MD for the patient is TALBOT, DAVID [name], MD  LOS - 7   Chief Complaint  Patient presents with  . Leg Swelling        Assessment & Plan  Principal Problem:   Cellulitis of left leg Active Problems:   Diabetes mellitus type 2, uncontrolled, with complications   Hyponatremia   HTN (hypertension)   Leukocytosis   ARF (acute renal failure)   Chronic diastolic heart failure   Sepsis   Left leg cellulitis  Sepsis secondary to Cellulitis of left leg versus UTI/cystitis -Cellulitis and leukocytosis improving  -Continue ceftriaxone and vancomycin -Blood cultures show no growth to date -UA showed showed 7-10 white blood cells, small leukocytes, urine culture shows no growth -CT scan does not show any abscess at this time -Continue pain control  ARF (acute renal failure)/ATN -Likely secondary to dehydration versus contrast versus infection -Creatinine currently improving, 2.06 -Holding nephrotoxic agents, Lasix, HCTZ and lisinopril -Nephrology following and signed, feeling this was likely due to obstruction -Renal US: The Mild increased parenchymal echotexture in both kidneys suggesting medical renal disease. No evidence of hydronephrosis. No significant atrophy.  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 -Consulted Dr. Berneice Heinrich, urologist -Recommended 2 weeks of antibiotics  COPD with hypoxia -Patient becomes hypoxic on room air, will obtain ABG -May  also be secondary to anemia -continue spiriva and breo -Will place on supplemental oxygen, to maintain her O2 sats above 92% -Will continue to monitor  Diabetes mellitus type 2, uncontrolled, with complications:  -Continue home insulin regimen of Levemir 140 units daily -Continue insulin sliding scale with CBG monitoring -HbA1c 14.7 -Diabetes coordinator following -Continue gabapentin, renally adjusted  Hyponatremia -Resolved, Na 141 -Likely secondary to dehydration with acute renal injury. -Will continue to monitor.  Hyperkalemia -Resolved, K 4.6 Likely secondary to acute renal failure  HTN (hypertension)  -Currently controlled.  -Holding Lasix, HCTZ, and lisinopril due to the acute kidney injury. -hydralazine 10mg  IV q6h PRN  Chronic diastolic heart failure  -Currently compensated, not in exacerbation. -Will continue to monitor her her daily weights, intake and output, fluid restriction.  Depression -Continue fluoxetine  Hypothyroidism -Continue Synthroid   Code Status: Full  Family Communication: None at bedside.  Disposition Plan: Admitted.     Procedures  -Renal US: The Mild increased parenchymal echotexture in both kidneys suggesting medical renal disease. No evidence of hydronephrosis. No significant atrophy.  Consults   Nephrology Urology  DVT Prophylaxis  Heparin   Lab Results  Component Value Date   PLT 305 08/22/2013    Medications  Scheduled Meds: . aspirin EC  81 mg Oral Daily  . atorvastatin  80 mg Oral Daily  . budesonide-formoterol  2 puff Inhalation BID  . cefTRIAXone (ROCEPHIN)  IV  1 g Intravenous Q24H  . docusate sodium  100 mg Oral BID  . feeding supplement (GLUCERNA SHAKE)  237 mL Oral Daily  . FLUoxetine  20 mg Oral BID  . gabapentin  100 mg Oral BID  . heparin  5,000 Units Subcutaneous Q8H  . insulin aspart  0-15 Units Subcutaneous TID WC  . insulin aspart  0-5 Units Subcutaneous QHS  . insulin aspart  20 Units Subcutaneous  TID WC  . insulin detemir  140 Units Subcutaneous Daily  . levothyroxine  25 mcg Oral QAC breakfast  . metoCLOPramide  5 mg Oral TID  . multivitamin with minerals  1 tablet Oral Daily  . oxybutynin  5 mg Oral QAC breakfast  . pantoprazole  40 mg Oral Daily  . tiotropium  18 mcg Inhalation Daily   Continuous Infusions:   PRN Meds:.acetaminophen, acetaminophen, haloperidol lactate, hydrALAZINE, morphine injection, ondansetron (ZOFRAN) IV, ondansetron, oxyCODONE, oxyCODONE-acetaminophen, polyethylene glycol  Antibiotics    Anti-infectives   Start     Dose/Rate Route Frequency Ordered Stop   08/22/13 0915  vancomycin (VANCOCIN) IVPB 1000 mg/200 mL premix     1,000 mg 200 mL/hr over 60 Minutes Intravenous  Once 08/22/13 0905 08/22/13 1102   08/20/13 1100  vancomycin (VANCOCIN) IVPB 750 mg/150 ml premix     750 mg 150 mL/hr over 60 Minutes Intravenous  Once 08/20/13 1059 08/20/13 1407   08/19/13 1800  vancomycin (VANCOCIN) 1,500 mg in sodium chloride 0.9 % 500 mL IVPB  Status:  Discontinued     1,500 mg 250 mL/hr over 120 Minutes Intravenous Every 48 hours 08/17/13 1800 08/17/13 1804   08/17/13 1900  vancomycin (VANCOCIN) 1,500 mg in sodium chloride 0.9 % 500 mL IVPB  Status:  Discontinued     1,500 mg 250 mL/hr over 120 Minutes Intravenous Every 48 hours 08/17/13 1804 08/18/13 1129   08/17/13 1830  vancomycin (VANCOCIN) 2,000 mg in sodium chloride 0.9 % 500 mL IVPB  Status:  Discontinued     2,000 mg 250 mL/hr over 120 Minutes Intravenous  Once 08/17/13 1759 08/17/13 1802   08/16/13 1200  vancomycin (VANCOCIN) IVPB 1000 mg/200 mL premix  Status:  Discontinued     1,000 mg 200 mL/hr over 60 Minutes Intravenous Every 12 hours 08/16/13 0344 08/16/13 0819   08/16/13 0900  cefTRIAXone (ROCEPHIN) 1 g in dextrose 5 % 50 mL IVPB     1 g 100 mL/hr over 30 Minutes Intravenous Every 24 hours 08/16/13 0819     08/16/13 0300  ertapenem (INVANZ) 1 g in sodium chloride 0.9 % 50 mL IVPB     1  g 100 mL/hr over 30 Minutes Intravenous  Once 08/16/13 0236 08/16/13 0349   08/15/13 2315  vancomycin (VANCOCIN) IVPB 1000 mg/200 mL premix     1,000 mg 200 mL/hr over 60 Minutes Intravenous  Once 08/15/13 2302 08/16/13 0316       Time Spent in minutes   25 minutes   Jakin Pavao D.O. on 08/22/2013 at 1:40 PM  Between 7am to 7pm - Pager - [phone]  After 7pm go to [email or web address] - password [email or web address]  And look for the night coverage person covering for me after hours  [facility] Office  [phone]    Subjective:   [name] seen and examined today.  Patient has no complaints this morning, but feels she has been out  of the last several days. Patient states that she does not remember anything the last few days. Patient denies any nausea vomiting at this time. Denies any shortness of breath or chest pain.  Objective:   Filed Vitals:   08/22/13 0721 08/22/13 0900 08/22/13 0940 08/22/13 1221  BP:  172/59    Pulse:  58    Temp:  97.3 F (36.3 C)    TempSrc:  Oral    Resp:  18    Height:      Weight:      SpO2: 86% 95% 95% 95%    Wt Readings from Last 3 Encounters:  08/21/13 104.101 kg (229 lb 8 oz)  05/05/13 100.426 kg (221 lb 6.4 oz)  04/22/13 99.292 kg (218 lb 14.4 oz)     Intake/Output Summary (Last 24 hours) at 08/22/13 1340 Last data filed at 08/22/13 0900  Gross per 24 hour  Intake   1680 ml  Output   1300 ml  Net    380 ml    Exam  General: Well developed, well nourished, NAD, appears stated age  HEENT: NCAT, PERRLA, EOMI, Anicteic Sclera, mucous membranes dry.   Neck: Supple, no JVD, no masses  Cardiovascular: S1 S2 auscultated, no rubs, murmurs or gallops. Regular rate and rhythm.  Respiratory: Clear to auscultation bilaterally with equal chest rise  Abdomen: Soft, nontender, nondistended, + bowel sounds  Extremities: warm dry without cyanosis clubbing of LLE.  Cellulitis marked on the LLE, improving. RLE BKA  Neuro:  AAOx3, cranial nerves grossly intact.  Skin: Without rashes exudates or nodules, cellulitis as noted above.  Psych: Normal affect and demeanor with intact judgement and insight  Data Review   Micro Results Recent Results (from the past 240 hour(s))  MRSA PCR SCREENING     Status: None   Collection Time    08/16/13  4:22 AM      Result Value Range Status   MRSA by PCR NEGATIVE  NEGATIVE Final   Comment:            The GeneXpert MRSA Assay (FDA     approved for NASAL specimens     only), is one component of a     comprehensive MRSA colonization     surveillance program. It is not     intended to diagnose MRSA     infection nor to guide or     monitor treatment for     MRSA infections.  CULTURE, BLOOD (ROUTINE X 2)     Status: None   Collection Time    08/16/13 11:00 AM      Result Value Range Status   Specimen Description BLOOD RIGHT ARM   Final   Special Requests BOTTLES DRAWN AEROBIC AND ANAEROBIC 10CC   Final   Culture  Setup Time     Final   Value: 08/16/2013 18:50     Performed at [facility]   Culture     Final   Value: NO GROWTH 5 DAYS     Performed at [facility]   Report Status 08/22/2013 FINAL   Final  CULTURE, BLOOD (ROUTINE X 2)     Status: None   Collection Time    08/16/13 11:15 AM      Result Value Range Status   Specimen Description BLOOD RIGHT ARM   Final   Special Requests BOTTLES DRAWN AEROBIC AND ANAEROBIC 10CC   Final   Culture  Setup Time     Final  Value: 08/16/2013 18:50     Performed at [facility]   Culture     Final   Value: NO GROWTH 5 DAYS     Performed at [facility]   Report Status 08/22/2013 FINAL   Final  URINE CULTURE     Status: None   Collection Time    08/19/13 12:25 PM      Result Value Range Status   Specimen Description URINE, CATHETERIZED   Final   Special Requests PT ON CEFTRIAXONE VANCO   Final   Culture  Setup Time     Final   Value: 08/19/2013 17:57     Performed at [facility]     Final   Value: NO GROWTH     Performed at [facility]   Culture     Final   Value: NO GROWTH     Performed at [facility]   Report Status 08/20/2013 FINAL   Final    Radiology Reports Ct Abdomen Pelvis Wo Contrast  08/16/2013   CLINICAL DATA:  Abdominal pain.  EXAM: CT ABDOMEN AND PELVIS WITHOUT CONTRAST  TECHNIQUE: Multidetector CT imaging of the abdomen and pelvis was performed following the standard protocol without intravenous contrast.  COMPARISON:  CT of the left femur performed 08/15/2013, and CT of the pelvis performed 01/23/2013  FINDINGS: The visualized lung bases are clear.  The liver and spleen are unremarkable in appearance. The gallbladder is within normal limits. The pancreas and left adrenal gland are unremarkable. A 1.2 cm right adrenal lesion is noted, too high in attenuation to characterize as an adrenal adenoma on this study.  Residual contrast is seen within the renal calyces. Nonspecific perinephric stranding is noted bilaterally. A small 1.0 cm hypodensity at the lower pole of the right kidney may reflect a small cyst. There is no evidence of hydronephrosis. No definite obstructing ureteral stones are seen.  No free fluid is identified. The small bowel is unremarkable in appearance. The stomach is within normal limits. No acute vascular abnormalities are seen.  The appendix is normal in caliber and contains air, without evidence for appendicitis. The colon is unremarkable in appearance.  The bladder is largely filled with contrast. The previously noted debris and air in the bladder are displaced by contrast. The amount of debris seems decreased from the recent prior study; some of the debris may have been passed in the urine. Scattered tiny foci of air are again seen outside the bladder wall; the appearance raises suspicion for emphysematous cystitis. The uterus is unremarkable in appearance. The ovaries are relatively symmetric; no  suspicious adnexal masses are seen. Enlarged left inguinal nodes are again seen.  No acute osseous abnormalities are identified.  IMPRESSION: 1. Previously noted debris and air in the bladder are displaced by contrast. The amount of debris appears decreased from the recent prior study; some of the debris may have been passed in the urine. Scattered tiny foci of air again seen outside the bladder wall; the appearance raises suspicion for emphysematous cystitis. 2. Small right renal cyst. 3. 1.2 cm right adrenal lesion, too high in attenuation to characterize as an adrenal adenoma on this study. Adrenal protocol CT or MRI could be considered for further evaluation, when and as deemed clinically appropriate. 4. Enlarged left inguinal nodes again seen.   Electronically Signed   By: Roanna Raider M.D.   On: 08/16/2013 03:51   Ct Femur Left W Contrast  08/16/2013   CLINICAL DATA:  Left thigh swelling and erythema, with left lateral thigh pain just above the knee.  EXAM: CT OF THE LEFT FEMUR WITH CONTRAST  TECHNIQUE: Multidetector CT imaging of the left femur was performed according to the standard protocol following intravenous contrast administration. Multiplanar CT image reconstructions were also generated.  CONTRAST:  OMNIPAQUE IOHEXOL 300 MG/ML  SOLN  COMPARISON:  Left knee radiographs performed 01/16/2006, and CT of the pelvis performed 01/23/2013  FINDINGS: There is diffuse soft tissue inflammation and edema along the lateral aspect of the left thigh, extending inferiorly below the left knee. Associated significant skin thickening is seen. Findings are most compatible with cellulitis. There is no evidence of underlying abscess at this time. There is minimal associated soft tissue edema underlying the fascia, though the underlying musculature is generally intact.  There is no evidence of vascular compromise. Very small knee joint effusion is noted. The knee joint is otherwise grossly unremarkable in  appearance. Enlarged left inguinal nodes are seen, measuring up to 1.6 cm in short axis. Scattered prominent collateral vessels are noted along the visualized anterior abdominal wall.  The left hip joint is unremarkable in appearance. No hip joint effusion is identified. The visualized portions of the uterus are grossly unremarkable. Note is made of a large amount of debris and solid material filling the bladder, with trace air noted outside of the bladder. This appears new from the CT of the pelvis performed 01/23/2013.  No acute osseous abnormalities are identified.  IMPRESSION: 1. Diffuse soft tissue inflammation and edema along the lateral aspect of the left thigh, extending inferiorly below the left knee, with associated skin thickening, compatible with cellulitis. No evidence of underlying abscess at this time. Minimal associated soft tissue edema seen underlying the fascia, though the underlying musculature is grossly intact. 2. There is small knee joint effusion noted. 3. Left inguinal lymphadenopathy seen, with nodes measuring up to 1.6 cm in short axis. 4. Prominent collateral vessels noted along the anterior abdominal wall. 5. Large amount of debris and solid material filling the bladder, with trace air noted outside the bladder. This is new from the CT of the pelvis performed 01/23/2013. This is a very unusual appearance, and could conceivably reflect a colovesical fistula, or possibly severe emphysematous cystitis. Would correlate for associated symptoms.  These results were called by telephone at the time of interpretation on 08/16/2013 at 1:27 AM to Dr. Eber Hong , who verbally acknowledged these results.   Electronically Signed   By: Roanna Raider M.D.   On: 08/16/2013 01:29    CBC  Recent Labs Lab 08/15/13 2256  08/18/13 0642 08/19/13 0735 08/20/13 0925 08/21/13 0455 08/22/13 0500  WBC 15.0*  < > 15.1* 12.8* 11.5* 11.3* 7.8  HGB 12.2  < > 10.7* 9.3* 8.4* 8.2* 7.6*  HCT 34.9*  < >  31.8* 27.4* 25.2* 24.8* 22.6*  PLT 243  < > 286 261 286 306 305  MCV 82.9  < > 82.8 82.5 84.0 84.6 84.3  MCH 29.0  < > 27.9 28.0 28.0 28.0 28.4  MCHC 35.0  < > 33.6 33.9 33.3 33.1 33.6  RDW 13.3  < > 13.9 14.1 14.2 14.3 14.2  LYMPHSABS 2.0  --   --   --   --   --   --   MONOABS 1.2*  --   --   --   --   --   --   EOSABS 0.0  --   --   --   --   --   --  BASOSABS 0.0  --   --   --   --   --   --   < > = values in this interval not displayed.  Chemistries   Recent Labs Lab 08/15/13 2256  08/17/13 1030 08/18/13 0642 08/19/13 0735 08/20/13 1140 08/21/13 0455 08/22/13 0500  NA 125*  < > 128* 127* 130* 141 139 141  K 3.9  < > 3.9 5.4* 5.4* 4.4 4.3 4.6  CL 86*  < > 90* 89* 90* 100 100 99  CO2 29  < > 22 19 21 25 23 27   GLUCOSE 379*  < > 237* 343* 222* 68* 79 124*  BUN 23  < > 32* 43* 53* 44* 43* 40*  CREATININE 1.50*  < > 3.38* 5.62* 6.60* 3.77* 2.98* 2.06*  CALCIUM 8.6  < > 8.8 9.5 9.0 8.8 8.9 8.7  AST 8  --  12  --   --   --   --   --   ALT 9  --  9  --   --   --   --   --   ALKPHOS 90  --  127*  --   --   --   --   --   BILITOT 0.4  --  0.2*  --   --   --   --   --   < > = values in this interval not displayed. ------------------------------------------------------------------------------------------------------------------ estimated creatinine clearance is 38.2 ml/min (by C-G formula based on Cr of 2.06). ------------------------------------------------------------------------------------------------------------------ No results found for this basename: HGBA1C,  in the last 72 hours ------------------------------------------------------------------------------------------------------------------ No results found for this basename: CHOL, HDL, LDLCALC, TRIG, CHOLHDL, LDLDIRECT,  in the last 72 hours ------------------------------------------------------------------------------------------------------------------ No results found for this basename: TSH, T4TOTAL, FREET3, T3FREE,  THYROIDAB,  in the last 72 hours ------------------------------------------------------------------------------------------------------------------  Recent Labs  08/21/13 1314  VITAMINB12 787  FOLATE 13.2  FERRITIN 591*  TIBC 133*  IRON 11*  RETICCTPCT 1.5    Coagulation profile No results found for this basename: INR, PROTIME,  in the last 168 hours  No results found for this basename: DDIMER,  in the last 72 hours  Cardiac Enzymes No results found for this basename: CK, CKMB, TROPONINI, MYOGLOBIN,  in the last 168 hours ------------------------------------------------------------------------------------------------------------------ No components found with this basename: POCBNP,

## 2013-08-23 LAB — VANCOMYCIN, RANDOM: Vancomycin Rm: 14.2 ug/mL

## 2013-08-23 LAB — CBC
HEMATOCRIT: 25.4 % — AB (ref 36.0–46.0)
Hemoglobin: 8.3 g/dL — ABNORMAL LOW (ref 12.0–15.0)
MCH: 27.9 pg (ref 26.0–34.0)
MCHC: 32.7 g/dL (ref 30.0–36.0)
MCV: 85.2 fL (ref 78.0–100.0)
Platelets: 359 10*3/uL (ref 150–400)
RBC: 2.98 MIL/uL — ABNORMAL LOW (ref 3.87–5.11)
RDW: 14.2 % (ref 11.5–15.5)
WBC: 7.5 10*3/uL (ref 4.0–10.5)

## 2013-08-23 LAB — GLUCOSE, CAPILLARY
GLUCOSE-CAPILLARY: 110 mg/dL — AB (ref 70–99)
Glucose-Capillary: 104 mg/dL — ABNORMAL HIGH (ref 70–99)
Glucose-Capillary: 260 mg/dL — ABNORMAL HIGH (ref 70–99)
Glucose-Capillary: 106 mg/dL — ABNORMAL HIGH (ref 70–99)

## 2013-08-23 LAB — BASIC METABOLIC PANEL
BUN: 33 mg/dL — AB (ref 6–23)
CO2: 28 mEq/L (ref 19–32)
CREATININE: 1.4 mg/dL — AB (ref 0.50–1.10)
Calcium: 8.7 mg/dL (ref 8.4–10.5)
Chloride: 104 mEq/L (ref 96–112)
GFR, EST AFRICAN AMERICAN: 49 mL/min — AB (ref >90)
GFR, EST NON AFRICAN AMERICAN: 42 mL/min — AB (ref >90)
Glucose, Bld: 119 mg/dL — ABNORMAL HIGH (ref 70–99)
Potassium: 4.1 mEq/L (ref 3.7–5.3)
Sodium: 144 mEq/L (ref 137–147)

## 2013-08-23 LAB — OCCULT BLOOD X 1 CARD TO LAB, STOOL: Fecal Occult Bld: NEGATIVE

## 2013-08-23 MED ORDER — VANCOMYCIN HCL IN DEXTROSE 1-5 GM/200ML-% IV SOLN
1000.0000 mg | INTRAVENOUS | Status: DC
  Administered 2013-08-23 – 2013-08-28 (×6): 1000 mg via INTRAVENOUS
  Filled 2013-08-23 (×7): qty 200

## 2013-08-23 MED ORDER — FERROUS SULFATE 325 (65 FE) MG PO TABS
325.0000 mg | ORAL_TABLET | Freq: Three times a day (TID) | ORAL | Status: DC
  Administered 2013-08-23 – 2013-08-28 (×13): 325 mg via ORAL
  Filled 2013-08-23 (×17): qty 1

## 2013-08-23 NOTE — Evaluation (Addendum)
Occupational Therapy Evaluation Patient Details Name: [name] MRN: [id number] DOB: 10/30/1960 Today's Date: 08/23/2013 Time: 9242-6834 OT Time Calculation (min): 31 min  OT Assessment / Plan / Recommendation History of present illness Patient is a 53 yo female admitted with 9 day history of LLE edema and reddness following a fall.  Patient dx with cellulitis.  Patient with h/o DM with neuropathy, Rt BKA with prosthesis, HTN, PVD, CHF, anxiety.   Clinical Impression   Pt presents with below problem list. Pt could benefit from acute OT to increase independence and safety prior to d/c. Pt refusing to go to SNF for additional rehab prior to d/c home, so recommending HHOT.      OT Assessment  Patient needs continued OT Services    Follow Up Recommendations  Home health OT;Supervision/Assistance - 24 hour    Barriers to Discharge      Equipment Recommendations  3 in 1 bedside comode    Recommendations for Other Services    Frequency  Min 2X/week    Precautions / Restrictions Precautions Precautions: Fall Required Braces or Orthoses: Other Brace/Splint Other Brace/Splint: Rt LE prosthesis   Restrictions Weight Bearing Restrictions: No   Pertinent Vitals/Pain No pain reported. O2 in 80's at times on RA-educated and performed deep breathing and trended to 90's. Placed back on O2 at end of session.     ADL  Eating/Feeding: Independent Where Assessed - Eating/Feeding: Chair Upper Body Dressing: Set up Where Assessed - Upper Body Dressing: Supported sitting Lower Body Dressing: Min guard Where Assessed - Lower Body Dressing: Supported sit to stand Toilet Transfer: Hydrographic surveyor Method: Sit to Barista: Regular height toilet;Grab bars Toileting - Architect and Hygiene: Min guard Where Assessed - Engineer, mining and Hygiene: Sit on 3-in-1 or toilet;Standing Tub/Shower Transfer Method: Not assessed Equipment  Used: Gait belt;Rolling walker Transfers/Ambulation Related to ADLs: Min guard for ambulation and transfers. ADL Comments: Educated on deep breathing technique as well as energy conservation techniques. Educated on tub transfer techniques and recommended pt sponge bathing unless someone is with her to see her in tub. Educated on use of non skid rug in bathroom.  Pt donned prosthesis sitting EOB-cues for hand placement when standing.     OT Diagnosis: Generalized weakness;Other (comment) (decreased balance)  OT Problem List: Impaired balance (sitting and/or standing);Decreased knowledge of use of DME or AE;Decreased knowledge of precautions;Cardiopulmonary status limiting activity;Decreased strength;Decreased activity tolerance OT Treatment Interventions: Self-care/ADL training;DME and/or AE instruction;Therapeutic activities;Patient/family education;Balance training;Energy conservation   OT Goals(Current goals can be found in the care plan section) Acute Rehab OT Goals Patient Stated Goal: go home OT Goal Formulation: With patient Time For Goal Achievement: 08/30/13 Potential to Achieve Goals: Good ADL Goals Pt Will Perform Lower Body Bathing: with modified independence;sit to/from stand Pt Will Perform Lower Body Dressing: with modified independence;sit to/from stand Pt Will Transfer to Toilet: with modified independence;ambulating (3 in 1 over commode) Pt Will Perform Toileting - Clothing Manipulation and hygiene: with modified independence;sit to/from stand;sitting/lateral leans Additional ADL Goal #1: Pt will independently verbalize and demonstrate 3/3 energy conservation techniques.   Visit Information  Last OT Received On: 08/23/13 Assistance Needed: +1 History of Present Illness: Patient is a 53 yo female admitted with 9 day history of LLE edema and reddness following a fall.  Patient dx with cellulitis.  Patient with h/o DM with neuropathy, Rt BKA with prosthesis, HTN, PVD, CHF,  anxiety.       Prior  Functioning     Home Living Family/patient expects to be discharged to:: Private residence Living Arrangements: Alone Available Help at Discharge: Family;Friend(s);Available PRN/intermittently Type of Home: Apartment [facility] [employer]) Home Access: Stairs to enter Secretary/administrator of Steps: 3 Entrance Stairs-Rails: None Home Layout: One level Home Equipment: Environmental consultant - 2 wheels;Cane - single point;Bedside commode;Shower seat;Wheelchair - manual;Tub bench;Hand held shower head Prior Function Level of Independence: Independent with assistive device(s);Needs assistance Gait / Transfers Assistance Needed: Patient uses cane and prosthesis for gait ADL's / Homemaking Assistance Needed: Assist with transport for errands/appointments. Communication Communication: No difficulties         Vision/Perception     Cognition  Cognition Arousal/Alertness: Awake/alert Behavior During Therapy: WFL for tasks assessed/performed Overall Cognitive Status: Within Functional Limits for tasks assessed    Extremity/Trunk Assessment Upper Extremity Assessment Upper Extremity Assessment: Overall WFL for tasks assessed Lower Extremity Assessment Lower Extremity Assessment: Defer to PT evaluation     Mobility Bed Mobility Bed Mobility: Supine to Sit Supine to Sit: 4: Min guard Details for Bed Mobility Assistance: Cues to use mattress to get up versus rails. Transfers Transfers: Sit to Stand;Stand to Sit Sit to Stand: 4: Min guard;With upper extremity assist;From bed;From toilet Stand to Sit: 4: Min guard;To chair/3-in-1;To toilet;To bed Details for Transfer Assistance: Cues for hand placement.     Exercise     Balance     End of Session OT - End of Session Equipment Utilized During Treatment: Gait belt;Rolling walker Activity Tolerance: Patient tolerated treatment well Patient left: in chair;with call bell/phone within reach;with family/visitor  present Nurse Communication: Other (comment) (asked tech to change pt's bed; lead off)  GO     Earlie Raveling OTR/L [phone] 08/23/2013, 3:13 PM

## 2013-08-23 NOTE — Progress Notes (Signed)
ANTIBIOTIC CONSULT NOTE - FOLLOW UP  Pharmacy Consult for vancomycin Indication: cellulitis   Labs:  Recent Labs  08/21/13 0455 08/22/13 0500 08/23/13 0525  WBC 11.3* 7.8 7.5  HGB 8.2* 7.6* 8.3*  PLT 306 305 359  CREATININE 2.98* 2.06* 1.40*   Estimated Creatinine Clearance: 56.2 ml/min (by C-G formula based on Cr of 1.4).  Recent Labs  08/22/13 0500 08/23/13 0525  VANCORANDOM 10.7 14.2       Assessment/Plan:  52yo female with random vanc level at goal (10-15).  SCr has improved dramatically.  Will begin vancomycin 1000mg  IV Q24H and continue to monitor renal function and levels.  , PharmD, BCPS  08/23/2013,6:46 AM

## 2013-08-23 NOTE — Progress Notes (Signed)
[facility]                                                                                Patient Demographics  [name], is a 53 y.o. female, DOB - [birth year], ZOX:[id number]  Admit date - 08/15/2013   Admitting Physician Ron Parker, MD  Outpatient Primary MD for the patient is TALBOT, DAVID [name], MD  LOS - 8   Chief Complaint  Patient presents with  . Leg Swelling      Interim History: This is a 53 year old female history of type 2 diabetes, right BKA, chronic diastolic heart failure, hypertension that presented to the emergency room of her left lower extremity pain and swelling. Patient was having fever and chills at home. She was admitted for cellulitis of the left lower extremity. She has improved however she did have some altered mental status likely secondary to her sepsis. This has improved and resolved. Patient is currently with physical therapy during her hospital course she has Medicaid and cannot qualify for home health.   Assessment & Plan  Principal Problem:   Cellulitis of left leg Active Problems:   Diabetes mellitus type 2, uncontrolled, with complications   Hyponatremia   HTN (hypertension)   Leukocytosis   ARF (acute renal failure)   Chronic diastolic heart failure   Sepsis   Left leg cellulitis  Sepsis secondary to Cellulitis of left leg versus UTI/cystitis -Cellulitis and leukocytosis improving  -Continue ceftriaxone and vancomycin, day 8 -Blood cultures show no growth to date -UA showed showed 7-10 white blood cells, small leukocytes, urine culture shows no growth -CT scan does not show any abscess at this time -Continue pain control  ARF (acute renal failure)/ATN -Likely secondary to dehydration versus contrast versus infection -Creatinine currently improving, 1.4 -Holding nephrotoxic agents, Lasix, HCTZ and lisinopril -Nephrology following and signed, feeling this was likely due to obstruction -Renal US: The Mild increased  parenchymal echotexture in both kidneys suggesting medical renal disease. No evidence of hydronephrosis. No significant atrophy.  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 -Consulted Dr. Berneice Heinrich, urologist -Recommended 2 weeks of antibiotics  COPD with hypoxia -Patient becomes hypoxic on room air -May also be secondary to anemia -continue spiriva and breo -Will place on supplemental oxygen, to maintain her O2 sats above 92% -Will continue to monitor -Patient states she has oxygen at home -She will likely need outpatient evaluation for questionable sleep apnea  Diabetes mellitus type 2, uncontrolled, with complications:  -Continue home insulin regimen of Levemir 140 units daily -Continue insulin sliding scale with CBG monitoring -HbA1c 14.7 -Diabetes coordinator following -Continue gabapentin, renally adjusted  Hyponatremia -Resolved -Likely secondary to dehydration with acute renal injury. -Will continue to monitor.  Hyperkalemia -Resolved, Likely secondary to acute renal failure  HTN (hypertension)  -Currently controlled.  -Holding Lasix, HCTZ, and lisinopril due to the acute kidney injury. -hydralazine 10mg  IV q6h PRN  Chronic diastolic heart failure  -Currently compensated, not in  exacerbation. -Will continue to monitor her her daily weights, intake and output, fluid restriction.  Depression -Continue fluoxetine  Hypothyroidism -Continue Synthroid   Code Status: Full  Family Communication: Mother at bedside  Disposition Plan: Admitted.  Did not feel it safe for the patient to be discharged. She would like for her to work with physical therapy more. Physical therapy has seen the patient, she does  not qualify for home health at this time due to insurance purposes as well. Pending OT consult as well.   Procedures  -Renal US: The Mild increased parenchymal echotexture in both kidneys suggesting medical renal disease. No evidence of hydronephrosis. No significant atrophy.  Consults   Nephrology Urology  DVT Prophylaxis  Heparin   Lab Results  Component Value Date   PLT 359 08/23/2013    Medications  Scheduled Meds: . aspirin EC  81 mg Oral Daily  . atorvastatin  80 mg Oral Daily  . budesonide-formoterol  2 puff Inhalation BID  . cefTRIAXone (ROCEPHIN)  IV  1 g Intravenous Q24H  . docusate sodium  100 mg Oral BID  . feeding supplement (GLUCERNA SHAKE)  237 mL Oral Daily  . FLUoxetine  20 mg Oral BID  . gabapentin  100 mg Oral BID  . heparin  5,000 Units Subcutaneous Q8H  . insulin aspart  0-15 Units Subcutaneous TID WC  . insulin aspart  0-5 Units Subcutaneous QHS  . insulin aspart  20 Units Subcutaneous TID WC  . insulin detemir  140 Units Subcutaneous Daily  . levothyroxine  25 mcg Oral QAC breakfast  . metoCLOPramide  5 mg Oral TID  . multivitamin with minerals  1 tablet Oral Daily  . oxybutynin  5 mg Oral QAC breakfast  . pantoprazole  40 mg Oral Daily  . tiotropium  18 mcg Inhalation Daily  . vancomycin  1,000 mg Intravenous Q24H   Continuous Infusions:   PRN Meds:.acetaminophen, acetaminophen, haloperidol lactate, hydrALAZINE, morphine injection, ondansetron (ZOFRAN) IV, ondansetron, oxyCODONE, oxyCODONE-acetaminophen, polyethylene glycol  Antibiotics    Anti-infectives   Start     Dose/Rate Route Frequency Ordered Stop   08/23/13 0800  vancomycin (VANCOCIN) IVPB 1000 mg/200 mL premix     1,000 mg 200 mL/hr over 60 Minutes Intravenous Every 24 hours 08/23/13 0646     08/22/13 0915  vancomycin (VANCOCIN) IVPB 1000 mg/200 mL premix     1,000 mg 200 mL/hr over 60 Minutes Intravenous  Once 08/22/13 0905 08/22/13 1102   08/20/13 1100  vancomycin (VANCOCIN)  IVPB 750 mg/150 ml premix     750 mg 150 mL/hr over 60 Minutes Intravenous  Once 08/20/13 1059 08/20/13 1407   08/19/13 1800  vancomycin (VANCOCIN) 1,500 mg in sodium chloride 0.9 % 500 mL IVPB  Status:  Discontinued     1,500 mg 250 mL/hr over 120 Minutes Intravenous Every 48 hours 08/17/13 1800 08/17/13 1804   08/17/13 1900  vancomycin (VANCOCIN) 1,500 mg in sodium chloride 0.9 % 500 mL IVPB  Status:  Discontinued     1,500 mg 250 mL/hr over 120 Minutes Intravenous Every 48 hours 08/17/13 1804 08/18/13 1129   08/17/13 1830  vancomycin (VANCOCIN) 2,000 mg in sodium chloride 0.9 % 500 mL IVPB  Status:  Discontinued     2,000 mg 250 mL/hr over 120 Minutes Intravenous  Once 08/17/13 1759 08/17/13 1802   08/16/13 1200  vancomycin (VANCOCIN) IVPB 1000 mg/200 mL premix  Status:  Discontinued     1,000 mg 200 mL/hr over 60 Minutes Intravenous  Every 12 hours 08/16/13 0344 08/16/13 0819   08/16/13 0900  cefTRIAXone (ROCEPHIN) 1 g in dextrose 5 % 50 mL IVPB     1 g 100 mL/hr over 30 Minutes Intravenous Every 24 hours 08/16/13 0819     08/16/13 0300  ertapenem (INVANZ) 1 g in sodium chloride 0.9 % 50 mL IVPB     1 g 100 mL/hr over 30 Minutes Intravenous  Once 08/16/13 0236 08/16/13 0349   08/15/13 2315  vancomycin (VANCOCIN) IVPB 1000 mg/200 mL premix     1,000 mg 200 mL/hr over 60 Minutes Intravenous  Once 08/15/13 2302 08/16/13 0316       Time Spent in minutes   25 minutes   Trista Ciocca D.O. on 08/23/2013 at 12:05 PM  Between 7am to 7pm - Pager - [phone]  After 7pm go to [email or web address] - password [email or web address]  And look for the night coverage person covering for me after hours  [facility] Office  [phone]    Subjective:   Erza Mothershead seen and examined today.  Patient has no complaints this morning. She does state her shortness of breath has improved. She does have oxygen at home however has not been wearing it for some time. She denies any chest pain, dizziness.   She does complain of some weakness.  Objective:   Filed Vitals:   08/22/13 1840 08/22/13 2024 08/23/13 0408 08/23/13 0834  BP: 163/57 154/69 162/71 179/72  Pulse: 64 64 67 65  Temp:  97.9 F (36.6 C) 97.8 F (36.6 C) 98.6 F (37 C)  TempSrc:  Oral Oral Oral  Resp:  18 18 20   Height:      Weight:  104.101 kg (229 lb 8 oz)    SpO2:  93% 95% 93%    Wt Readings from Last 3 Encounters:  08/22/13 104.101 kg (229 lb 8 oz)  05/05/13 100.426 kg (221 lb 6.4 oz)  04/22/13 99.292 kg (218 lb 14.4 oz)     Intake/Output Summary (Last 24 hours) at 08/23/13 1205 Last data filed at 08/23/13 0800  Gross per 24 hour  Intake    870 ml  Output   1926 ml  Net  -1056 ml    Exam  General: Well developed, well nourished, NAD, appears stated age  HEENT: NCAT, PERRLA, EOMI, Anicteic Sclera, mucous membranes moist   Neck: Supple, no JVD, no masses  Cardiovascular: S1 S2 auscultated, Regular rate and rhythm.  Respiratory: Clear to auscultation bilaterally with equal chest rise  Abdomen: Soft, nontender, nondistended, + bowel sounds  Extremities: warm dry without cyanosis clubbing of LLE.  Cellulitis marked on the LLE, improving. RLE BKA  Neuro: AAOx3, cranial nerves grossly intact.  Skin: Without rashes exudates or nodules, cellulitis as noted above.  Psych: Normal affect and demeanor with intact judgement and insight  Data Review   Micro Results Recent Results (from the past 240 hour(s))  MRSA PCR SCREENING     Status: None   Collection Time    08/16/13  4:22 AM      Result Value Range Status   MRSA by PCR NEGATIVE  NEGATIVE Final   Comment:            The GeneXpert MRSA Assay (FDA     approved for NASAL specimens     only), is one component of a     comprehensive MRSA colonization     surveillance program. It is not     intended to diagnose MRSA  infection nor to guide or     monitor treatment for     MRSA infections.  CULTURE, BLOOD (ROUTINE X 2)     Status: None    Collection Time    08/16/13 11:00 AM      Result Value Range Status   Specimen Description BLOOD RIGHT ARM   Final   Special Requests BOTTLES DRAWN AEROBIC AND ANAEROBIC 10CC   Final   Culture  Setup Time     Final   Value: 08/16/2013 18:50     Performed at [facility]   Culture     Final   Value: NO GROWTH 5 DAYS     Performed at [facility]   Report Status 08/22/2013 FINAL   Final  CULTURE, BLOOD (ROUTINE X 2)     Status: None   Collection Time    08/16/13 11:15 AM      Result Value Range Status   Specimen Description BLOOD RIGHT ARM   Final   Special Requests BOTTLES DRAWN AEROBIC AND ANAEROBIC 10CC   Final   Culture  Setup Time     Final   Value: 08/16/2013 18:50     Performed at [facility]   Culture     Final   Value: NO GROWTH 5 DAYS     Performed at [facility]   Report Status 08/22/2013 FINAL   Final  URINE CULTURE     Status: None   Collection Time    08/19/13 12:25 PM      Result Value Range Status   Specimen Description URINE, CATHETERIZED   Final   Special Requests PT ON CEFTRIAXONE VANCO   Final   Culture  Setup Time     Final   Value: 08/19/2013 17:57     Performed at [facility] Count     Final   Value: NO GROWTH     Performed at [facility]   Culture     Final   Value: NO GROWTH     Performed at [facility]   Report Status 08/20/2013 FINAL   Final    Radiology Reports Ct Abdomen Pelvis Wo Contrast  08/16/2013   CLINICAL DATA:  Abdominal pain.  EXAM: CT ABDOMEN AND PELVIS WITHOUT CONTRAST  TECHNIQUE: Multidetector CT imaging of the abdomen and pelvis was performed following the standard protocol without intravenous contrast.  COMPARISON:  CT of the left femur performed 08/15/2013, and CT of the pelvis performed 01/23/2013  FINDINGS: The visualized lung bases are clear.  The liver and spleen are unremarkable in appearance. The gallbladder is within normal limits. The pancreas and left  adrenal gland are unremarkable. A 1.2 cm right adrenal lesion is noted, too high in attenuation to characterize as an adrenal adenoma on this study.  Residual contrast is seen within the renal calyces. Nonspecific perinephric stranding is noted bilaterally. A small 1.0 cm hypodensity at the lower pole of the right kidney may reflect a small cyst. There is no evidence of hydronephrosis. No definite obstructing ureteral stones are seen.  No free fluid is identified. The small bowel is unremarkable in appearance. The stomach is within normal limits. No acute vascular abnormalities are seen.  The appendix is normal in caliber and contains air, without evidence for appendicitis. The colon is unremarkable in appearance.  The bladder is largely filled with contrast. The previously noted debris and air in the bladder are displaced by contrast. The amount  of debris seems decreased from the recent prior study; some of the debris may have been passed in the urine. Scattered tiny foci of air are again seen outside the bladder wall; the appearance raises suspicion for emphysematous cystitis. The uterus is unremarkable in appearance. The ovaries are relatively symmetric; no suspicious adnexal masses are seen. Enlarged left inguinal nodes are again seen.  No acute osseous abnormalities are identified.  IMPRESSION: 1. Previously noted debris and air in the bladder are displaced by contrast. The amount of debris appears decreased from the recent prior study; some of the debris may have been passed in the urine. Scattered tiny foci of air again seen outside the bladder wall; the appearance raises suspicion for emphysematous cystitis. 2. Small right renal cyst. 3. 1.2 cm right adrenal lesion, too high in attenuation to characterize as an adrenal adenoma on this study. Adrenal protocol CT or MRI could be considered for further evaluation, when and as deemed clinically appropriate. 4. Enlarged left inguinal nodes again seen.    Electronically Signed   By: Roanna Raider M.D.   On: 08/16/2013 03:51   Ct Femur Left W Contrast  08/16/2013   CLINICAL DATA:  Left thigh swelling and erythema, with left lateral thigh pain just above the knee.  EXAM: CT OF THE LEFT FEMUR WITH CONTRAST  TECHNIQUE: Multidetector CT imaging of the left femur was performed according to the standard protocol following intravenous contrast administration. Multiplanar CT image reconstructions were also generated.  CONTRAST:  OMNIPAQUE IOHEXOL 300 MG/ML  SOLN  COMPARISON:  Left knee radiographs performed 01/16/2006, and CT of the pelvis performed 01/23/2013  FINDINGS: There is diffuse soft tissue inflammation and edema along the lateral aspect of the left thigh, extending inferiorly below the left knee. Associated significant skin thickening is seen. Findings are most compatible with cellulitis. There is no evidence of underlying abscess at this time. There is minimal associated soft tissue edema underlying the fascia, though the underlying musculature is generally intact.  There is no evidence of vascular compromise. Very small knee joint effusion is noted. The knee joint is otherwise grossly unremarkable in appearance. Enlarged left inguinal nodes are seen, measuring up to 1.6 cm in short axis. Scattered prominent collateral vessels are noted along the visualized anterior abdominal wall.  The left hip joint is unremarkable in appearance. No hip joint effusion is identified. The visualized portions of the uterus are grossly unremarkable. Note is made of a large amount of debris and solid material filling the bladder, with trace air noted outside of the bladder. This appears new from the CT of the pelvis performed 01/23/2013.  No acute osseous abnormalities are identified.  IMPRESSION: 1. Diffuse soft tissue inflammation and edema along the lateral aspect of the left thigh, extending inferiorly below the left knee, with associated skin thickening, compatible  with cellulitis. No evidence of underlying abscess at this time. Minimal associated soft tissue edema seen underlying the fascia, though the underlying musculature is grossly intact. 2. There is small knee joint effusion noted. 3. Left inguinal lymphadenopathy seen, with nodes measuring up to 1.6 cm in short axis. 4. Prominent collateral vessels noted along the anterior abdominal wall. 5. Large amount of debris and solid material filling the bladder, with trace air noted outside the bladder. This is new from the CT of the pelvis performed 01/23/2013. This is a very unusual appearance, and could conceivably reflect a colovesical fistula, or possibly severe emphysematous cystitis. Would correlate for associated symptoms.  These results  were called by telephone at the time of interpretation on 08/16/2013 at 1:27 AM to Dr. Eber HongBRIAN MILLER , who verbally acknowledged these results.   Electronically Signed   By: Roanna RaiderJeffery  Chang M.D.   On: 08/16/2013 01:29    CBC  Recent Labs Lab 08/19/13 0735 08/20/13 0925 08/21/13 0455 08/22/13 0500 08/23/13 0525  WBC 12.8* 11.5* 11.3* 7.8 7.5  HGB 9.3* 8.4* 8.2* 7.6* 8.3*  HCT 27.4* 25.2* 24.8* 22.6* 25.4*  PLT 261 286 306 305 359  MCV 82.5 84.0 84.6 84.3 85.2  MCH 28.0 28.0 28.0 28.4 27.9  MCHC 33.9 33.3 33.1 33.6 32.7  RDW 14.1 14.2 14.3 14.2 14.2    Chemistries   Recent Labs Lab 08/17/13 1030  08/19/13 0735 08/20/13 1140 08/21/13 0455 08/22/13 0500 08/23/13 0525  NA 128*  < > 130* 141 139 141 144  K 3.9  < > 5.4* 4.4 4.3 4.6 4.1  CL 90*  < > 90* 100 100 99 104  CO2 22  < > 21 25 23 27 28   GLUCOSE 237*  < > 222* 68* 79 124* 119*  BUN 32*  < > 53* 44* 43* 40* 33*  CREATININE 3.38*  < > 6.60* 3.77* 2.98* 2.06* 1.40*  CALCIUM 8.8  < > 9.0 8.8 8.9 8.7 8.7  AST 12  --   --   --   --   --   --   ALT 9  --   --   --   --   --   --   ALKPHOS 127*  --   --   --   --   --   --   BILITOT 0.2*  --   --   --   --   --   --   < > = values in this interval not  displayed. ------------------------------------------------------------------------------------------------------------------ estimated creatinine clearance is 56.2 ml/min (by C-G formula based on Cr of 1.4). ------------------------------------------------------------------------------------------------------------------ No results found for this basename: HGBA1C,  in the last 72 hours ------------------------------------------------------------------------------------------------------------------ No results found for this basename: CHOL, HDL, LDLCALC, TRIG, CHOLHDL, LDLDIRECT,  in the last 72 hours ------------------------------------------------------------------------------------------------------------------ No results found for this basename: TSH, T4TOTAL, FREET3, T3FREE, THYROIDAB,  in the last 72 hours ------------------------------------------------------------------------------------------------------------------  Recent Labs  08/21/13 1314  VITAMINB12 787  FOLATE 13.2  FERRITIN 591*  TIBC 133*  IRON 11*  RETICCTPCT 1.5    Coagulation profile No results found for this basename: INR, PROTIME,  in the last 168 hours  No results found for this basename: DDIMER,  in the last 72 hours  Cardiac Enzymes No results found for this basename: CK, CKMB, TROPONINI, MYOGLOBIN,  in the last 168 hours ------------------------------------------------------------------------------------------------------------------ No components found with this basename: POCBNP,

## 2013-08-24 ENCOUNTER — Encounter (HOSPITAL_COMMUNITY): Payer: Self-pay | Admitting: General Surgery

## 2013-08-24 ENCOUNTER — Inpatient Hospital Stay (HOSPITAL_COMMUNITY): Payer: Medicaid Other

## 2013-08-24 DIAGNOSIS — L03119 Cellulitis of unspecified part of limb: Secondary | ICD-10-CM

## 2013-08-24 DIAGNOSIS — L02419 Cutaneous abscess of limb, unspecified: Secondary | ICD-10-CM

## 2013-08-24 LAB — GLUCOSE, CAPILLARY
Glucose-Capillary: 72 mg/dL (ref 70–99)
Glucose-Capillary: 211 mg/dL — ABNORMAL HIGH (ref 70–99)
Glucose-Capillary: 127 mg/dL — ABNORMAL HIGH (ref 70–99)

## 2013-08-24 LAB — CBC
HEMATOCRIT: 25.5 % — AB (ref 36.0–46.0)
Hemoglobin: 8.3 g/dL — ABNORMAL LOW (ref 12.0–15.0)
MCH: 28 pg (ref 26.0–34.0)
MCHC: 32.5 g/dL (ref 30.0–36.0)
MCV: 86.1 fL (ref 78.0–100.0)
Platelets: 362 10*3/uL (ref 150–400)
RBC: 2.96 MIL/uL — ABNORMAL LOW (ref 3.87–5.11)
RDW: 14.3 % (ref 11.5–15.5)
WBC: 7 10*3/uL (ref 4.0–10.5)

## 2013-08-24 LAB — VANCOMYCIN, RANDOM: Vancomycin Rm: 15.1 ug/mL

## 2013-08-24 MED ORDER — FUROSEMIDE 10 MG/ML IJ SOLN
40.0000 mg | Freq: Every day | INTRAMUSCULAR | Status: DC
  Administered 2013-08-24 – 2013-08-27 (×4): 40 mg via INTRAVENOUS
  Filled 2013-08-24 (×4): qty 4

## 2013-08-24 MED ORDER — IPRATROPIUM-ALBUTEROL 0.5-2.5 (3) MG/3ML IN SOLN
3.0000 mL | Freq: Three times a day (TID) | RESPIRATORY_TRACT | Status: DC
  Administered 2013-08-24 – 2013-08-28 (×10): 3 mL via RESPIRATORY_TRACT
  Filled 2013-08-24 (×30): qty 3

## 2013-08-24 MED ORDER — HEPARIN SODIUM (PORCINE) 5000 UNIT/ML IJ SOLN
5000.0000 [IU] | Freq: Three times a day (TID) | INTRAMUSCULAR | Status: AC
  Administered 2013-08-24 (×2): 5000 [IU] via SUBCUTANEOUS
  Filled 2013-08-24 (×2): qty 1

## 2013-08-24 MED ORDER — HEPARIN SODIUM (PORCINE) 5000 UNIT/ML IJ SOLN
5000.0000 [IU] | Freq: Three times a day (TID) | INTRAMUSCULAR | Status: DC
  Administered 2013-08-26 – 2013-08-28 (×6): 5000 [IU] via SUBCUTANEOUS
  Filled 2013-08-24 (×10): qty 1

## 2013-08-24 MED ORDER — HEPARIN SODIUM (PORCINE) 5000 UNIT/ML IJ SOLN: 5000.0000 [IU] | Freq: Three times a day (TID) | INTRAMUSCULAR | Status: DC

## 2013-08-24 MED ORDER — AMLODIPINE BESYLATE 10 MG PO TABS
10.0000 mg | ORAL_TABLET | Freq: Every day | ORAL | Status: DC
  Administered 2013-08-24 – 2013-08-28 (×5): 10 mg via ORAL
  Filled 2013-08-24 (×5): qty 1

## 2013-08-24 NOTE — Progress Notes (Signed)
ANTIBIOTIC CONSULT NOTE - FOLLOW UP  Pharmacy Consult for vancomycin Indication: cellulitis   Labs:  Recent Labs  08/22/13 0500 08/23/13 0525 08/24/13 0500  WBC 7.8 7.5 7.0  HGB 7.6* 8.3* 8.3*  PLT 305 359 362  CREATININE 2.06* 1.40*  --    Estimated Creatinine Clearance: 56.2 ml/min (by C-G formula based on Cr of 1.4).  Recent Labs  08/23/13 0525 08/24/13 0712  VANCORANDOM 14.2 15.1       Assessment/Plan:  52yo female with sepsis 2/2 cellulitis of left leg. Currently on CTX and vancomycin. Blood Cx with no growth to date, afebrile, wbc wnl. Renal function now much improved, Scr down to 1.40. Random vanc level slightly above goal (15.1).   Goal of Therapy:  Vancomycin trough level 10-15 mcg/ml   Plan:  1) Continue with Vancomycin 1 gm IV q24 hours 2) Continue Ceftriaxone 1 gm IV Q 24 hours  3) Recheck random vancomycin level on Mon AM. With her changing renal function, it is difficult to predict vancomycin clearance. 4) F/u LOT   Margie Billet, PharmD Clinical Pharmacist - Resident Pager: [phone] Pharmacy: [phone] 08/24/2013 3:12 PM

## 2013-08-24 NOTE — Consult Note (Signed)
Seen, agree with above.    OR tomorrow.

## 2013-08-24 NOTE — Progress Notes (Signed)
PATIENT DETAILS Name: [name] Age: 53 y.o. Sex: female Date of Birth: [birth year] Admit Date: 08/15/2013 Admitting Physician Ron Parker, MD ERX:[id number], DAVID C, MD  Subjective: Has developed a fluctuant area in the left lat thigh-tender  Assessment/Plan: Sepsis secondary to Cellulitis of left leg - Sepsis has resolved with IV antibiotics and IV fluids  Left thigh cellulitis with abscess formation - Continue with empiric antibiotics-vancomycin and ceftriaxone day 9 -  general surgery consulted-to the OR for incision and drainage 1/5  Emphysematous Cystitis  - Urine culture obtained when patient was already on antibiotics, therefore urine culture negative. - Continue with IV Rocephin - Urology consulted- plans are to continue with antibiotics for a total of 2 weeks. Foley is in place, will discontinue over the next 1-2 days.  Acute renal failure - Multifactorial-dehydration, contrast, sepsis and possibly to obstruction - Creatinine significantly better, repeat electrolytes tomorrow - Nephrology consulted, has signed off   Acute hypoxic respiratory failure - Continue oxygen, continue nebs - Check chest x-ray - Incentive spirometry  Hypertension - Start amlodipine  Diabetes - CBGs stable - Continue with anatomy and NovoLog  Anemia - Suspect secondary to acute illness/dilutional, no evidence of overt loss - Monitor hemoglobin and hematocrit  Chronic diastolic heart failure  - Given hypoxia, check chest x-ray. - Clinically seems to be somewhat euvolemic.  Hyperkalemia/hyponatremia - Resolved  Hypothyroidism - Continue Synthroid  Disposition: Remain inpatient  DVT Prophylaxis: Prophylactic Heparin   Code Status: Full code   Family Communication Mother at bedside  Procedures:  None  CONSULTS:  nephrology, general surgery and urology  Time spent 40 minutes-which includes 50% of the time with face-to-face with patient and  family.   MEDICATIONS: Scheduled Meds: . aspirin EC  81 mg Oral Daily  . atorvastatin  80 mg Oral Daily  . budesonide-formoterol  2 puff Inhalation BID  . cefTRIAXone (ROCEPHIN)  IV  1 g Intravenous Q24H  . docusate sodium  100 mg Oral BID  . feeding supplement (GLUCERNA SHAKE)  237 mL Oral Daily  . ferrous sulfate  325 mg Oral TID WC  . FLUoxetine  20 mg Oral BID  . gabapentin  100 mg Oral BID  . heparin  5,000 Units Subcutaneous Q8H  . [START ON 08/26/2013] heparin  5,000 Units Subcutaneous Q8H  . insulin aspart  0-15 Units Subcutaneous TID WC  . insulin aspart  0-5 Units Subcutaneous QHS  . insulin aspart  20 Units Subcutaneous TID WC  . insulin detemir  140 Units Subcutaneous Daily  . levothyroxine  25 mcg Oral QAC breakfast  . metoCLOPramide  5 mg Oral TID  . multivitamin with minerals  1 tablet Oral Daily  . oxybutynin  5 mg Oral QAC breakfast  . pantoprazole  40 mg Oral Daily  . tiotropium  18 mcg Inhalation Daily  . vancomycin  1,000 mg Intravenous Q24H   Continuous Infusions:  PRN Meds:.acetaminophen, acetaminophen, haloperidol lactate, hydrALAZINE, morphine injection, ondansetron (ZOFRAN) IV, ondansetron, oxyCODONE, oxyCODONE-acetaminophen, polyethylene glycol  Antibiotics: Anti-infectives   Start     Dose/Rate Route Frequency Ordered Stop   08/23/13 0800  vancomycin (VANCOCIN) IVPB 1000 mg/200 mL premix     1,000 mg 200 mL/hr over 60 Minutes Intravenous Every 24 hours 08/23/13 0646     08/22/13 0915  vancomycin (VANCOCIN) IVPB 1000 mg/200 mL premix     1,000 mg 200 mL/hr over 60 Minutes Intravenous  Once 08/22/13 0905 08/22/13 1102   08/20/13 1100  vancomycin (VANCOCIN)  IVPB 750 mg/150 ml premix     750 mg 150 mL/hr over 60 Minutes Intravenous  Once 08/20/13 1059 08/20/13 1407   08/19/13 1800  vancomycin (VANCOCIN) 1,500 mg in sodium chloride 0.9 % 500 mL IVPB  Status:  Discontinued     1,500 mg 250 mL/hr over 120 Minutes Intravenous Every 48 hours 08/17/13 1800  08/17/13 1804   08/17/13 1900  vancomycin (VANCOCIN) 1,500 mg in sodium chloride 0.9 % 500 mL IVPB  Status:  Discontinued     1,500 mg 250 mL/hr over 120 Minutes Intravenous Every 48 hours 08/17/13 1804 08/18/13 1129   08/17/13 1830  vancomycin (VANCOCIN) 2,000 mg in sodium chloride 0.9 % 500 mL IVPB  Status:  Discontinued     2,000 mg 250 mL/hr over 120 Minutes Intravenous  Once 08/17/13 1759 08/17/13 1802   08/16/13 1200  vancomycin (VANCOCIN) IVPB 1000 mg/200 mL premix  Status:  Discontinued     1,000 mg 200 mL/hr over 60 Minutes Intravenous Every 12 hours 08/16/13 0344 08/16/13 0819   08/16/13 0900  cefTRIAXone (ROCEPHIN) 1 g in dextrose 5 % 50 mL IVPB     1 g 100 mL/hr over 30 Minutes Intravenous Every 24 hours 08/16/13 0819     08/16/13 0300  ertapenem (INVANZ) 1 g in sodium chloride 0.9 % 50 mL IVPB     1 g 100 mL/hr over 30 Minutes Intravenous  Once 08/16/13 0236 08/16/13 0349   08/15/13 2315  vancomycin (VANCOCIN) IVPB 1000 mg/200 mL premix     1,000 mg 200 mL/hr over 60 Minutes Intravenous  Once 08/15/13 2302 08/16/13 0316       PHYSICAL EXAM: Vital signs in last 24 hours: Filed Vitals:   08/24/13 0432 08/24/13 0805 08/24/13 0903 08/24/13 1305  BP: 146/54 197/81  184/88  Pulse: 63 62  59  Temp: 97.4 F (36.3 C) 97.4 F (36.3 C)  98 F (36.7 C)  TempSrc: Oral Oral  Oral  Resp: 20 18  18   Height:      Weight:      SpO2: 96% 88% 92% 89%    Weight change: 0 kg (0 lb) Filed Weights   08/21/13 2042 08/22/13 2024 08/23/13 2014  Weight: 104.101 kg (229 lb 8 oz) 104.101 kg (229 lb 8 oz) 104.101 kg (229 lb 8 oz)   Body mass index is 38.19 kg/(m^2).   Gen Exam: Awake and alert with clear speech.   Neck: Supple, No JVD.  Chest: B/L Clear.   CVS: S1 S2 Regular, no murmurs.  Abdomen: soft, BS +, non tender, non distended.  Extremities: Fluctuant area on the left lateral thigh-approximately 5x7 cm in size. Right BKA Neurologic: Non Focal.   Skin: No Rash.   Wounds:  N/A.    Intake/Output from previous day:  Intake/Output Summary (Last 24 hours) at 08/24/13 1545 Last data filed at 08/24/13 0845  Gross per 24 hour  Intake    360 ml  Output   1650 ml  Net  -1290 ml     LAB RESULTS: CBC  Recent Labs Lab 08/20/13 0925 08/21/13 0455 08/22/13 0500 08/23/13 0525 08/24/13 0500  WBC 11.5* 11.3* 7.8 7.5 7.0  HGB 8.4* 8.2* 7.6* 8.3* 8.3*  HCT 25.2* 24.8* 22.6* 25.4* 25.5*  PLT 286 306 305 359 362  MCV 84.0 84.6 84.3 85.2 86.1  MCH 28.0 28.0 28.4 27.9 28.0  MCHC 33.3 33.1 33.6 32.7 32.5  RDW 14.2 14.3 14.2 14.2 14.3    Chemistries  Recent Labs Lab 08/19/13 0735 08/20/13 1140 08/21/13 0455 08/22/13 0500 08/23/13 0525  NA 130* 141 139 141 144  K 5.4* 4.4 4.3 4.6 4.1  CL 90* 100 100 99 104  CO2 21 25 23 27 28   GLUCOSE 222* 68* 79 124* 119*  BUN 53* 44* 43* 40* 33*  CREATININE 6.60* 3.77* 2.98* 2.06* 1.40*  CALCIUM 9.0 8.8 8.9 8.7 8.7    CBG:  Recent Labs Lab 08/23/13 1140 08/23/13 1622 08/23/13 2019 08/24/13 0722 08/24/13 1135  GLUCAP 260* 110* 106* 72 211*    GFR Estimated Creatinine Clearance: 56.2 ml/min (by C-G formula based on Cr of 1.4).  Coagulation profile No results found for this basename: INR, PROTIME,  in the last 168 hours  Cardiac Enzymes No results found for this basename: CK, CKMB, TROPONINI, MYOGLOBIN,  in the last 168 hours  No components found with this basename: POCBNP,  No results found for this basename: DDIMER,  in the last 72 hours No results found for this basename: HGBA1C,  in the last 72 hours No results found for this basename: CHOL, HDL, LDLCALC, TRIG, CHOLHDL, LDLDIRECT,  in the last 72 hours No results found for this basename: TSH, T4TOTAL, FREET3, T3FREE, THYROIDAB,  in the last 72 hours No results found for this basename: VITAMINB12, FOLATE, FERRITIN, TIBC, IRON, RETICCTPCT,  in the last 72 hours No results found for this basename: LIPASE, AMYLASE,  in the last 72 hours  Urine  Studies No results found for this basename: UACOL, UAPR, USPG, UPH, UTP, UGL, UKET, UBIL, UHGB, UNIT, UROB, ULEU, UEPI, UWBC, URBC, UBAC, CAST, CRYS, UCOM, BILUA,  in the last 72 hours  MICROBIOLOGY: Recent Results (from the past 240 hour(s))  MRSA PCR SCREENING     Status: None   Collection Time    08/16/13  4:22 AM      Result Value Range Status   MRSA by PCR NEGATIVE  NEGATIVE Final   Comment:            The GeneXpert MRSA Assay (FDA     approved for NASAL specimens     only), is one component of a     comprehensive MRSA colonization     surveillance program. It is not     intended to diagnose MRSA     infection nor to guide or     monitor treatment for     MRSA infections.  CULTURE, BLOOD (ROUTINE X 2)     Status: None   Collection Time    08/16/13 11:00 AM      Result Value Range Status   Specimen Description BLOOD RIGHT ARM   Final   Special Requests BOTTLES DRAWN AEROBIC AND ANAEROBIC 10CC   Final   Culture  Setup Time     Final   Value: 08/16/2013 18:50     Performed at 08/18/2013   Culture     Final   Value: NO GROWTH 5 DAYS     Performed at [facility]   Report Status 08/22/2013 FINAL   Final  CULTURE, BLOOD (ROUTINE X 2)     Status: None   Collection Time    08/16/13 11:15 AM      Result Value Range Status   Specimen Description BLOOD RIGHT ARM   Final   Special Requests BOTTLES DRAWN AEROBIC AND ANAEROBIC 10CC   Final   Culture  Setup Time     Final   Value: 08/16/2013 18:50  Performed at [facility]     Final   Value: NO GROWTH 5 DAYS     Performed at [facility]   Report Status 08/22/2013 FINAL   Final  URINE CULTURE     Status: None   Collection Time    08/19/13 12:25 PM      Result Value Range Status   Specimen Description URINE, CATHETERIZED   Final   Special Requests PT ON CEFTRIAXONE VANCO   Final   Culture  Setup Time     Final   Value: 08/19/2013 17:57     Performed at [facility]     Final   Value: NO GROWTH     Performed at [facility]   Culture     Final   Value: NO GROWTH     Performed at [facility]   Report Status 08/20/2013 FINAL   Final    RADIOLOGY STUDIES/RESULTS: Ct Abdomen Pelvis Wo Contrast  08/16/2013   CLINICAL DATA:  Abdominal pain.  EXAM: CT ABDOMEN AND PELVIS WITHOUT CONTRAST  TECHNIQUE: Multidetector CT imaging of the abdomen and pelvis was performed following the standard protocol without intravenous contrast.  COMPARISON:  CT of the left femur performed 08/15/2013, and CT of the pelvis performed 01/23/2013  FINDINGS: The visualized lung bases are clear.  The liver and spleen are unremarkable in appearance. The gallbladder is within normal limits. The pancreas and left adrenal gland are unremarkable. A 1.2 cm right adrenal lesion is noted, too high in attenuation to characterize as an adrenal adenoma on this study.  Residual contrast is seen within the renal calyces. Nonspecific perinephric stranding is noted bilaterally. A small 1.0 cm hypodensity at the lower pole of the right kidney may reflect a small cyst. There is no evidence of hydronephrosis. No definite obstructing ureteral stones are seen.  No free fluid is identified. The small bowel is unremarkable in appearance. The stomach is within normal limits. No acute vascular abnormalities are seen.  The appendix is normal in caliber and contains air, without evidence for appendicitis. The colon is unremarkable in appearance.  The bladder is largely filled with contrast. The previously noted debris and air in the bladder are displaced by contrast. The amount of debris seems decreased from the recent prior study; some of the debris may have been passed in the urine. Scattered tiny foci of air are again seen outside the bladder wall; the appearance raises suspicion for emphysematous cystitis. The uterus is unremarkable in appearance. The ovaries are relatively symmetric; no  suspicious adnexal masses are seen. Enlarged left inguinal nodes are again seen.  No acute osseous abnormalities are identified.  IMPRESSION: 1. Previously noted debris and air in the bladder are displaced by contrast. The amount of debris appears decreased from the recent prior study; some of the debris may have been passed in the urine. Scattered tiny foci of air again seen outside the bladder wall; the appearance raises suspicion for emphysematous cystitis. 2. Small right renal cyst. 3. 1.2 cm right adrenal lesion, too high in attenuation to characterize as an adrenal adenoma on this study. Adrenal protocol CT or MRI could be considered for further evaluation, when and as deemed clinically appropriate. 4. Enlarged left inguinal nodes again seen.   Electronically Signed   By: Roanna Raider M.D.   On: 08/16/2013 03:51   Ct Head Wo Contrast  08/20/2013   CLINICAL DATA:  Altered mental  status.  EXAM: CT HEAD WITHOUT CONTRAST  TECHNIQUE: Contiguous axial images were obtained from the base of the skull through the vertex without intravenous contrast.  COMPARISON:  10/06/2011 CT.  05/03/2011 MR.  FINDINGS: No intracranial hemorrhage.  No CT evidence of large acute infarct  No intracranial mass lesion noted on this unenhanced exam.  Calcification adjacent to the superior aspect of the falx unchanged and probably an incidental finding rather than and a meningioma.  Mastoid air cells, middle ear cavities and visualized paranasal sinuses are clear. Very mild exophthalmos.  IMPRESSION: No acute abnormality.  Please see above.   Electronically Signed   By: Bridgett Larsson M.D.   On: 08/20/2013 11:44   Ct Femur Left W Contrast  08/16/2013   CLINICAL DATA:  Left thigh swelling and erythema, with left lateral thigh pain just above the knee.  EXAM: CT OF THE LEFT FEMUR WITH CONTRAST  TECHNIQUE: Multidetector CT imaging of the left femur was performed according to the standard protocol following intravenous contrast  administration. Multiplanar CT image reconstructions were also generated.  CONTRAST:  OMNIPAQUE IOHEXOL 300 MG/ML  SOLN  COMPARISON:  Left knee radiographs performed 01/16/2006, and CT of the pelvis performed 01/23/2013  FINDINGS: There is diffuse soft tissue inflammation and edema along the lateral aspect of the left thigh, extending inferiorly below the left knee. Associated significant skin thickening is seen. Findings are most compatible with cellulitis. There is no evidence of underlying abscess at this time. There is minimal associated soft tissue edema underlying the fascia, though the underlying musculature is generally intact.  There is no evidence of vascular compromise. Very small knee joint effusion is noted. The knee joint is otherwise grossly unremarkable in appearance. Enlarged left inguinal nodes are seen, measuring up to 1.6 cm in short axis. Scattered prominent collateral vessels are noted along the visualized anterior abdominal wall.  The left hip joint is unremarkable in appearance. No hip joint effusion is identified. The visualized portions of the uterus are grossly unremarkable. Note is made of a large amount of debris and solid material filling the bladder, with trace air noted outside of the bladder. This appears new from the CT of the pelvis performed 01/23/2013.  No acute osseous abnormalities are identified.  IMPRESSION: 1. Diffuse soft tissue inflammation and edema along the lateral aspect of the left thigh, extending inferiorly below the left knee, with associated skin thickening, compatible with cellulitis. No evidence of underlying abscess at this time. Minimal associated soft tissue edema seen underlying the fascia, though the underlying musculature is grossly intact. 2. There is small knee joint effusion noted. 3. Left inguinal lymphadenopathy seen, with nodes measuring up to 1.6 cm in short axis. 4. Prominent collateral vessels noted along the anterior abdominal wall. 5.  Large amount of debris and solid material filling the bladder, with trace air noted outside the bladder. This is new from the CT of the pelvis performed 01/23/2013. This is a very unusual appearance, and could conceivably reflect a colovesical fistula, or possibly severe emphysematous cystitis. Would correlate for associated symptoms.  These results were called by telephone at the time of interpretation on 08/16/2013 at 1:27 AM to Dr. Eber Hong , who verbally acknowledged these results.   Electronically Signed   By: Roanna Raider M.D.   On: 08/16/2013 01:29   US Renal  08/19/2013   CLINICAL DATA:  Increasing creatinine.  EXAM: RENAL/URINARY TRACT ULTRASOUND COMPLETE  COMPARISON:  CT abdomen and pelvis 08/16/2013  FINDINGS: Right  Kidney:  Length: 13.1 cm. Normal parenchymal thickness with mild increased cortical echotexture. Changes suggest medical renal disease. No hydronephrosis or solid mass.  Left Kidney:  Length: 12.4 cm. Normal parenchymal thickness with mild increased cortical echotexture. Changes suggest medical renal disease. No hydronephrosis or solid mass.  Bladder:  Bladder is decompressed and not visualized.  IMPRESSION: The Mild increased parenchymal echotexture in both kidneys suggesting medical renal disease. No evidence of hydronephrosis. No significant atrophy.   Electronically Signed   By: Burman Nieves M.D.   On: 08/19/2013 Delanna Ahmadi, MD  [facility] Pager:[phone]  If 7PM-7AM, please contact night-coverage [email or web address] Password [email or web address] 08/24/2013, 3:45 PM   LOS: 9 days

## 2013-08-24 NOTE — Consult Note (Signed)
[name] 1961-03-23  [id number].   Requesting MD: Dr. Oren Binet Chief Complaint/Reason for Consult: left thigh abscess HPI: This is a 53 yo female with multiple medical problems who presented with a right inguinal abscess in June of 2014.  Dr. Brantley Stage and Dr. Ninfa Linden both I&D'd this.  She has done well.  On 08-06-13, she fell.  Since that time, she has developed a cellulitis of her left leg.  She has been admitted and placed on Rocephin and Vancomycin.  The cellulitis has improved, but now she has an area that appears to be fluctuant.  We have been asked to evaluate her for possible I&D.  ROS: Please see HPI, otherwise all other systems are currently negative.  Family History  Problem Relation Age of Onset  . Hypertension Mother   . Hyperlipidemia Mother   . Hypertension Brother   . Hyperlipidemia Brother   . Cancer Maternal Aunt   . Diabetes Paternal Aunt   . Diabetes Paternal Uncle   . Diabetes Paternal Grandmother   . Anesthesia problems Neg Hx   . Hypotension Neg Hx   . Malignant hyperthermia Neg Hx   . Pseudochol deficiency Neg Hx     Past Medical History  Diagnosis Date  . Diabetic neuropathy   . Cellulitis   . Thyroid disease   . Hyperlipidemia   . Heart murmur   . Neuromuscular disorder   . Concussion as a teenager    slight  . Diarrhea     pt takes Reglan tid  . Hypothyroidism     takes Synthroid daily  . Depression     takes CYmbalta daily  . Peripheral edema     takes Lasix daily  . MRSA (methicillin resistant staph aureus) culture positive     hx of 2012  . Staph infection 2007  . Complication of anesthesia 01/2013    "didn't know where I was; who I was; talking out the top of my head" (2013/05/17)  . Hypertension   . Peripheral vascular disease   . CHF (congestive heart failure)   . Pneumonia 1980's    "hospitalized w/it" (05/17/2013)  . Chronic bronchitis   . Orthopnea     "just one; yesterday morning" (May 17, 2013)  . Type II diabetes  mellitus     Novolog,Levemir,and Lovaza daily  . GERD (gastroesophageal reflux disease)   . Daily headache   . Rheumatoid arthritis     "both knees" (May 17, 2013)  . Anxiety   . Kidney stone     "passed on before" (05-17-13)    Past Surgical History  Procedure Laterality Date  . Amputation    . Leg amputation below knee Right 2012  . Toe amputation Left ?2011     only 2 toes remaning.   . Tubal ligation  1990's  . Foot surgery Right 2012    "put pins in one year; amputated toes another OR" (2013/05/17)  . Wrist surgery Right 1980's    "pinched nerve" (2013/05/17)  . Dilation and curettage of uterus  1980's  . Refractive surgery Bilateral 2014  . Incision and drainage abscess N/A 01/24/2013    Procedure: INCISION AND DRAINAGE ABSCESS;  Surgeon: Joyice Faster. Cornett, MD;  Location: [facility];  Service: General;  Laterality: N/A;  . Inguinal hernia repair N/A 01/28/2013    Procedure: I&D Right Groin Wound;  Surgeon: Harl Bowie, MD;  Location: [facility];  Service: General;  Laterality: N/A;  . Ganglion cyst excision Left 1990's    "wrist" (  04/21/2013)  . Bladder surgery  1970's    "stretched the mouth of my bladder" (04/21/2013)    Social History:  reports that she has been smoking Cigarettes.  She has a 16.5 pack-year smoking history. She has never used smokeless tobacco. She reports that she does not drink alcohol or use illicit drugs.  Allergies:  Allergies  Allergen Reactions  . Gadolinium      Code: VOM, Desc: Pt began vomiting immed post infusion of multihance, Onset Date: [id number]   . Ivp Dye [Iodinated Diagnostic Agents] Nausea And Vomiting  . Metformin Other (See Comments)     diarrhea  . Penicillins Hives    Medications Prior to Admission  Medication Sig Dispense Refill  . albuterol (PROVENTIL HFA;VENTOLIN HFA) 108 (90 BASE) MCG/ACT inhaler Inhale 2 puffs into the lungs every 6 (six) hours as needed for shortness of breath.  1 Inhaler  2  . aspirin EC 81 MG tablet Take 81 mg  by mouth daily.      Marland Kitchen atorvastatin (LIPITOR) 80 MG tablet Take 80 mg by mouth daily.      . clindamycin (CLEOCIN) 300 MG capsule Take 300 mg by mouth 3 (three) times daily. Started on 08-11-13 for 10 days.      Marland Kitchen FLUoxetine (PROZAC) 20 MG tablet Take 20 mg by mouth 2 (two) times daily.       . Fluticasone Furoate-Vilanterol (BREO ELLIPTA) 100-25 MCG/INH AEPB Inhale 1 puff into the lungs daily.      . furosemide (LASIX) 40 MG tablet Take 1 tablet (40 mg total) by mouth daily.  30 tablet  0  . gabapentin (NEURONTIN) 800 MG tablet Take 800 mg by mouth 3 (three) times daily.       . hydrochlorothiazide (MICROZIDE) 12.5 MG capsule Take 12.5 mg by mouth daily.      . insulin aspart (NOVOLOG) 100 UNIT/ML injection Inject 36-48 Units into the skin 3 (three) times daily with meals. *per sliding scale      . insulin detemir (LEVEMIR) 100 UNIT/ML injection Inject 140 Units into the skin every morning. *uses 70 units on each side      . levothyroxine (SYNTHROID, LEVOTHROID) 25 MCG tablet Take 25 mcg by mouth every morning.       Marland Kitchen lisinopril (PRINIVIL,ZESTRIL) 5 MG tablet Take 5 mg by mouth daily.      . metoCLOPramide (REGLAN) 5 MG tablet Take 5 mg by mouth 3 (three) times daily.       Marland Kitchen nystatin (MYCOSTATIN/NYSTOP) 100000 UNIT/GM POWD Apply topically.      Marland Kitchen omeprazole (PRILOSEC) 20 MG capsule Take 20 mg by mouth daily.      Marland Kitchen oxybutynin (DITROPAN) 5 MG tablet Take 5 mg by mouth every morning.        Marland Kitchen oxyCODONE-acetaminophen (PERCOCET) 10-325 MG per tablet Take 1 tablet by mouth every 4 (four) hours as needed for pain.      Marland Kitchen tiotropium (SPIRIVA) 18 MCG inhalation capsule Place 18 mcg into inhaler and inhale daily.        Blood pressure 197/81, pulse 62, temperature 97.4 F (36.3 C), temperature source Oral, resp. rate 18, height '5\' 5"'  (1.651 m), weight 229 lb 8 oz (104.101 kg), SpO2 92.00%. Physical Exam: General: pleasant, obese white female who is sitting up in a chair in NAD HEENT: head is  normocephalic, atraumatic.  Sclera are noninjected.  PERRL.  Ears and nose without any masses or lesions.  Mouth is pink and moist Heart: regular,  rate, and rhythm.  Normal s1,s2. No obvious murmurs, gallops, or rubs noted.  Palpable radial and pedal pulses bilaterally Lungs: CTAB, no wheezes, rhonchi, or rales noted.  Respiratory effort nonlabored Abd: soft, NT, ND, +BS, no masses, hernias, or organomegaly MS: amputation of RLE.  LLE with resolving cellulitis, upper extremities normal.  No cyanosis, clubbing, or edema Skin: warm and dry with no masses, lesions, or rashes.  She has an area of erythema and fluctuance noted on the lateral aspect of her left thigh.  Erythema, induration, and fluctuance does track superiorly. This is all tender. Psych: A&Ox3 with an appropriate affect.    Results for orders placed during the hospital encounter of 08/15/13 (from the past 48 hour(s))  BLOOD GAS, ARTERIAL     Status: Abnormal   Collection Time    08/22/13  2:11 PM      Result Value Range   FIO2 0.21     pH, Arterial 7.444  7.350 - 7.450   pCO2 arterial 38.3  35.0 - 45.0 mmHg   pO2, Arterial 67.0 (*) 80.0 - 100.0 mmHg   Bicarbonate 25.9 (*) 20.0 - 24.0 mEq/L   TCO2 27.0  0 - 100 mmol/L   Acid-Base Excess 2.1 (*) 0.0 - 2.0 mmol/L   O2 Saturation 92.0     Patient temperature 98.6     Collection site RADIAL     Drawn by [id number]     Sample type ARTERIAL     Allens test (pass/fail) PASS  PASS  GLUCOSE, CAPILLARY     Status: Abnormal   Collection Time    08/22/13  4:52 PM      Result Value Range   Glucose-Capillary 247 (*) 70 - 99 mg/dL  GLUCOSE, CAPILLARY     Status: Abnormal   Collection Time    08/22/13  8:54 PM      Result Value Range   Glucose-Capillary 163 (*) 70 - 99 mg/dL  VANCOMYCIN, RANDOM     Status: None   Collection Time    08/23/13  5:25 AM      Result Value Range   Vancomycin Rm 14.2     Comment:            Random Vancomycin therapeutic     range is dependent on dosage  and     time of specimen collection.     A peak range is 20.0-40.0 ug/mL     A trough range is 5.0-15.0 ug/mL             CBC     Status: Abnormal   Collection Time    08/23/13  5:25 AM      Result Value Range   WBC 7.5  4.0 - 10.5 K/uL   RBC 2.98 (*) 3.87 - 5.11 MIL/uL   Hemoglobin 8.3 (*) 12.0 - 15.0 g/dL   HCT 25.4 (*) 36.0 - 46.0 %   MCV 85.2  78.0 - 100.0 fL   MCH 27.9  26.0 - 34.0 pg   MCHC 32.7  30.0 - 36.0 g/dL   RDW 14.2  11.5 - 15.5 %   Platelets 359  150 - 400 K/uL  BASIC METABOLIC PANEL     Status: Abnormal   Collection Time    08/23/13  5:25 AM      Result Value Range   Sodium 144  137 - 147 mEq/L   Comment: Please note change in reference range.   Potassium 4.1  3.7 - 5.3 mEq/L  Comment: Please note change in reference range.   Chloride 104  96 - 112 mEq/L   CO2 28  19 - 32 mEq/L   Glucose, Bld 119 (*) 70 - 99 mg/dL   BUN 33 (*) 6 - 23 mg/dL   Creatinine, Ser 1.40 (*) 0.50 - 1.10 mg/dL   Calcium 8.7  8.4 - 10.5 mg/dL   GFR calc non Af Amer 42 (*) >90 mL/min   GFR calc Af Amer 49 (*) >90 mL/min   Comment: (NOTE)     The eGFR has been calculated using the CKD EPI equation.     This calculation has not been validated in all clinical situations.     eGFR's persistently <90 mL/min signify possible Chronic Kidney     Disease.  GLUCOSE, CAPILLARY     Status: Abnormal   Collection Time    08/23/13  7:33 AM      Result Value Range   Glucose-Capillary 104 (*) 70 - 99 mg/dL  GLUCOSE, CAPILLARY     Status: Abnormal   Collection Time    08/23/13 11:40 AM      Result Value Range   Glucose-Capillary 260 (*) 70 - 99 mg/dL  OCCULT BLOOD X 1 CARD TO LAB, STOOL     Status: None   Collection Time    08/23/13 11:55 AM      Result Value Range   Fecal Occult Bld NEGATIVE  NEGATIVE  GLUCOSE, CAPILLARY     Status: Abnormal   Collection Time    08/23/13  4:22 PM      Result Value Range   Glucose-Capillary 110 (*) 70 - 99 mg/dL  GLUCOSE, CAPILLARY     Status: Abnormal    Collection Time    08/23/13  8:19 PM      Result Value Range   Glucose-Capillary 106 (*) 70 - 99 mg/dL  CBC     Status: Abnormal   Collection Time    08/24/13  5:00 AM      Result Value Range   WBC 7.0  4.0 - 10.5 K/uL   RBC 2.96 (*) 3.87 - 5.11 MIL/uL   Hemoglobin 8.3 (*) 12.0 - 15.0 g/dL   HCT 25.5 (*) 36.0 - 46.0 %   MCV 86.1  78.0 - 100.0 fL   MCH 28.0  26.0 - 34.0 pg   MCHC 32.5  30.0 - 36.0 g/dL   RDW 14.3  11.5 - 15.5 %   Platelets 362  150 - 400 K/uL  VANCOMYCIN, RANDOM     Status: None   Collection Time    08/24/13  7:12 AM      Result Value Range   Vancomycin Rm 15.1     Comment:            Random Vancomycin therapeutic     range is dependent on dosage and     time of specimen collection.     A peak range is 20.0-40.0 ug/mL     A trough range is 5.0-15.0 ug/mL             GLUCOSE, CAPILLARY     Status: None   Collection Time    08/24/13  7:22 AM      Result Value Range   Glucose-Capillary 72  70 - 99 mg/dL   No results found.     Assessment/Plan 1. Left thigh abscess 2. Improving left thigh cellulitis Patient Active Problem List   Diagnosis Date Noted  . Cellulitis of  left leg 08/16/2013  . Cellulitis 08/16/2013  . Chronic diastolic heart failure 88/41/6606  . Sepsis 08/16/2013  . Left leg cellulitis 08/16/2013  . Acute diastolic heart failure 30/16/0109  . SOB (shortness of breath) 04/20/2013  . Diabetes mellitus 04/20/2013  . Proteinuria 01/26/2013  . ARF (acute renal failure) 01/24/2013  . Abscess of groin, right 01/23/2013  . HTN (hypertension) 01/23/2013  . Leukocytosis 01/23/2013  . Anemia of chronic disease 01/23/2013  . Hypothyroidism 01/23/2013  . Hyponatremia 10/06/2011  . HYPERCHOLESTEROLEMIA 08/17/2010  . DIABETIC FOOT ULCER, TOE 02/18/2010  . DEPRESSION 02/01/2010  . TOBACCO ABUSE 06/04/2009  . PERIPHERAL NEUROPATHY 01/30/2008  . Diabetes mellitus type 2, uncontrolled, with complications 32/35/5732   Plan: 1. Will make NPO  p MN and plan to go to OR tomorrow for I&D.  The patient would be prefer to not be put completely to sleep, so we can attempt this under MAC sedation. 2. Hold heparin tomorrow 3. Cont abx therapy. 4. D/w patient and she is agreeable. 5. Will need HH set up for dressing changes afterwards.  Capri Raben E 08/24/2013, 12:05 PM Pager: [phone]

## 2013-08-25 ENCOUNTER — Encounter (HOSPITAL_COMMUNITY)
Admission: EM | Disposition: A | Discharge status: Home Health | Payer: Self-pay | Source: Home / Self Care | Attending: Internal Medicine

## 2013-08-25 ENCOUNTER — Encounter (HOSPITAL_COMMUNITY): Payer: Medicaid Other | Admitting: Anesthesiology

## 2013-08-25 ENCOUNTER — Inpatient Hospital Stay (HOSPITAL_COMMUNITY): Payer: Medicaid Other | Admitting: Anesthesiology

## 2013-08-25 ENCOUNTER — Encounter (HOSPITAL_COMMUNITY): Payer: Self-pay | Admitting: Anesthesiology

## 2013-08-25 HISTORY — PX: I&D EXTREMITY: SHX5045

## 2013-08-25 LAB — CBC
HCT: 26.7 % — ABNORMAL LOW (ref 36.0–46.0)
HEMOGLOBIN: 8.3 g/dL — AB (ref 12.0–15.0)
MCH: 27.5 pg (ref 26.0–34.0)
MCHC: 31.1 g/dL (ref 30.0–36.0)
MCV: 88.4 fL (ref 78.0–100.0)
Platelets: 393 10*3/uL (ref 150–400)
RBC: 3.02 MIL/uL — ABNORMAL LOW (ref 3.87–5.11)
RDW: 14.8 % (ref 11.5–15.5)
WBC: 7.2 10*3/uL (ref 4.0–10.5)

## 2013-08-25 LAB — BASIC METABOLIC PANEL
BUN: 30 mg/dL — AB (ref 6–23)
CHLORIDE: 100 meq/L (ref 96–112)
CO2: 29 mEq/L (ref 19–32)
Calcium: 8.7 mg/dL (ref 8.4–10.5)
Creatinine, Ser: 1.13 mg/dL — ABNORMAL HIGH (ref 0.50–1.10)
GFR calc Af Amer: 64 mL/min — ABNORMAL LOW (ref >90)
GFR calc non Af Amer: 55 mL/min — ABNORMAL LOW (ref >90)
GLUCOSE: 132 mg/dL — AB (ref 70–99)
POTASSIUM: 4.7 meq/L (ref 3.7–5.3)
SODIUM: 142 meq/L (ref 137–147)

## 2013-08-25 LAB — GLUCOSE, CAPILLARY
GLUCOSE-CAPILLARY: 124 mg/dL — AB (ref 70–99)
GLUCOSE-CAPILLARY: 103 mg/dL — AB (ref 70–99)
GLUCOSE-CAPILLARY: 106 mg/dL — AB (ref 70–99)
Glucose-Capillary: 132 mg/dL — ABNORMAL HIGH (ref 70–99)
Glucose-Capillary: 119 mg/dL — ABNORMAL HIGH (ref 70–99)
Glucose-Capillary: 235 mg/dL — ABNORMAL HIGH (ref 70–99)

## 2013-08-25 LAB — VANCOMYCIN, RANDOM: VANCOMYCIN RM: 16.6 ug/mL

## 2013-08-25 LAB — SURGICAL PCR SCREEN
MRSA, PCR: NEGATIVE
STAPHYLOCOCCUS AUREUS: NEGATIVE

## 2013-08-25 LAB — PRO B NATRIURETIC PEPTIDE: PRO B NATRI PEPTIDE: 2051 pg/mL — AB (ref 0–125)

## 2013-08-25 SURGERY — IRRIGATION AND DEBRIDEMENT EXTREMITY
Anesthesia: General | Site: Thigh | Laterality: Left

## 2013-08-25 MED ORDER — PROPOFOL 10 MG/ML IV BOLUS
INTRAVENOUS | Status: DC | PRN
  Administered 2013-08-25: 20 mg via INTRAVENOUS

## 2013-08-25 MED ORDER — OXYCODONE HCL 5 MG PO TABS: 5.0000 mg | ORAL_TABLET | Freq: Once | ORAL | Status: DC | PRN

## 2013-08-25 MED ORDER — PROMETHAZINE HCL 25 MG/ML IJ SOLN: 6.2500 mg | INTRAMUSCULAR | Status: DC | PRN

## 2013-08-25 MED ORDER — HYDROMORPHONE HCL PF 1 MG/ML IJ SOLN: 0.2500 mg | INTRAMUSCULAR | Status: DC | PRN

## 2013-08-25 MED ORDER — ONDANSETRON HCL 4 MG/2ML IJ SOLN
INTRAMUSCULAR | Status: DC | PRN
  Administered 2013-08-25: 4 mg via INTRAVENOUS

## 2013-08-25 MED ORDER — MIDAZOLAM HCL 5 MG/5ML IJ SOLN
INTRAMUSCULAR | Status: DC | PRN
  Administered 2013-08-25: 2 mg via INTRAVENOUS

## 2013-08-25 MED ORDER — FENTANYL CITRATE 0.05 MG/ML IJ SOLN
100.0000 ug | Freq: Once | INTRAMUSCULAR | Status: AC
  Administered 2013-08-25: 50 ug via INTRAVENOUS

## 2013-08-25 MED ORDER — OXYCODONE HCL 5 MG/5ML PO SOLN: 5.0000 mg | Freq: Once | ORAL | Status: DC | PRN

## 2013-08-25 MED ORDER — FENTANYL CITRATE 0.05 MG/ML IJ SOLN
INTRAMUSCULAR | Status: AC
  Administered 2013-08-25: 50 ug via INTRAVENOUS
  Filled 2013-08-25: qty 2

## 2013-08-25 MED ORDER — FENTANYL CITRATE 0.05 MG/ML IJ SOLN
100.0000 ug | Freq: Once | INTRAMUSCULAR | Status: AC
  Administered 2013-08-25: 50 ug via INTRAVENOUS
  Filled 2013-08-25: qty 2

## 2013-08-25 MED ORDER — PROPOFOL INFUSION 10 MG/ML OPTIME
INTRAVENOUS | Status: DC | PRN
  Administered 2013-08-25: 75 ug/kg/min via INTRAVENOUS

## 2013-08-25 MED ORDER — FENTANYL CITRATE 0.05 MG/ML IJ SOLN
INTRAMUSCULAR | Status: DC | PRN
  Administered 2013-08-25: 25 ug via INTRAVENOUS
  Administered 2013-08-25 (×2): 50 ug via INTRAVENOUS
  Administered 2013-08-25: 25 ug via INTRAVENOUS

## 2013-08-25 MED ORDER — METOPROLOL TARTRATE 12.5 MG HALF TABLET
12.5000 mg | ORAL_TABLET | Freq: Two times a day (BID) | ORAL | Status: DC
  Administered 2013-08-25 – 2013-08-26 (×2): 12.5 mg via ORAL
  Filled 2013-08-25 (×5): qty 1

## 2013-08-25 MED ORDER — LACTATED RINGERS IV SOLN
INTRAVENOUS | Status: DC | PRN
  Administered 2013-08-25: 15:00:00 via INTRAVENOUS

## 2013-08-25 MED ORDER — OXYCODONE-ACETAMINOPHEN 5-325 MG PO TABS
1.0000 | ORAL_TABLET | ORAL | Status: DC | PRN
  Administered 2013-08-26 – 2013-08-27 (×3): 2 via ORAL
  Filled 2013-08-25 (×3): qty 2

## 2013-08-25 MED ORDER — ESMOLOL HCL 10 MG/ML IV SOLN
INTRAVENOUS | Status: DC | PRN
  Administered 2013-08-25 (×2): 10 mg via INTRAVENOUS

## 2013-08-25 MED ORDER — BUPIVACAINE-EPINEPHRINE (PF) 0.25% -1:200000 IJ SOLN
INTRAMUSCULAR | Status: AC
  Filled 2013-08-25: qty 30

## 2013-08-25 MED ORDER — BUPIVACAINE-EPINEPHRINE 0.25% -1:200000 IJ SOLN
INTRAMUSCULAR | Status: DC | PRN
  Administered 2013-08-25: 17 mL

## 2013-08-25 MED ORDER — FENTANYL CITRATE 0.05 MG/ML IJ SOLN: 50.0000 ug | INTRAMUSCULAR | Status: DC | PRN

## 2013-08-25 SURGICAL SUPPLY — 28 items
BANDAGE ELASTIC 6 VELCRO ST LF (GAUZE/BANDAGES/DRESSINGS) ×3 IMPLANT
BANDAGE GAUZE ELAST BULKY 4 IN (GAUZE/BANDAGES/DRESSINGS) ×3 IMPLANT
CANISTER SUCTION 2500CC (MISCELLANEOUS) ×3 IMPLANT
COVER SURGICAL LIGHT HANDLE (MISCELLANEOUS) ×3 IMPLANT
DRAPE LAPAROSCOPIC ABDOMINAL (DRAPES) IMPLANT
DRAPE PED LAPAROTOMY (DRAPES) IMPLANT
DRAPE UTILITY 15X26 W/TAPE STR (DRAPE) ×12 IMPLANT
DRSG PAD ABDOMINAL 8X10 ST (GAUZE/BANDAGES/DRESSINGS) ×3 IMPLANT
ELECT CAUTERY BLADE 6.4 (BLADE) ×3 IMPLANT
ELECT REM PT RETURN 9FT ADLT (ELECTROSURGICAL) ×3
ELECTRODE REM PT RTRN 9FT ADLT (ELECTROSURGICAL) ×1 IMPLANT
GAUZE PACKING IODOFORM 1 (PACKING) ×3 IMPLANT
GLOVE BIO SURGEON STRL SZ7.5 (GLOVE) ×3 IMPLANT
GLOVE BIOGEL PI IND STRL 8 (GLOVE) ×1 IMPLANT
GLOVE BIOGEL PI INDICATOR 8 (GLOVE) ×2
GOWN STRL NON-REIN LRG LVL3 (GOWN DISPOSABLE) ×6 IMPLANT
GOWN STRL REIN XL XLG (GOWN DISPOSABLE) ×3 IMPLANT
KIT BASIN OR (CUSTOM PROCEDURE TRAY) ×3 IMPLANT
KIT ROOM TURNOVER OR (KITS) ×3 IMPLANT
NS IRRIG 1000ML POUR BTL (IV SOLUTION) ×3 IMPLANT
PACK GENERAL/GYN (CUSTOM PROCEDURE TRAY) ×3 IMPLANT
PAD ARMBOARD 7.5X6 YLW CONV (MISCELLANEOUS) ×3 IMPLANT
SPONGE GAUZE 4X4 12PLY (GAUZE/BANDAGES/DRESSINGS) IMPLANT
SPONGE GAUZE 4X4 12PLY STER LF (GAUZE/BANDAGES/DRESSINGS) ×3 IMPLANT
SWAB COLLECTION DEVICE MRSA (MISCELLANEOUS) IMPLANT
TOWEL OR 17X24 6PK STRL BLUE (TOWEL DISPOSABLE) ×3 IMPLANT
TOWEL OR 17X26 10 PK STRL BLUE (TOWEL DISPOSABLE) ×3 IMPLANT
TUBE ANAEROBIC SPECIMEN COL (MISCELLANEOUS) IMPLANT

## 2013-08-25 NOTE — Preoperative (Signed)
Beta Blockers   Reason not to administer Beta Blockers:Not Applicable 

## 2013-08-25 NOTE — Progress Notes (Signed)
Patient ready for OR today.  Getting IV started now, so can't evaluate her left leg.  We plan on this afternoon proceeding with I&D in OR.  [name] E 11:31 AM 08/25/2013

## 2013-08-25 NOTE — Progress Notes (Signed)
PATIENT DETAILS Name: [name] Age: 53 y.o. Sex: female Date of Birth: 1960/09/01 Admit Date: 08/15/2013 Admitting Physician Ron Parker, MD UEA:[id number], DAVID C, MD  Subjective: No major complaints overnight.  Assessment/Plan: Sepsis secondary to Cellulitis of left leg - Sepsis has resolved with IV antibiotics and IV fluids - Blood culture on 08/16/13 negative - Urine culture on 08/19/13 negative  Left thigh cellulitis with abscess formation - Continue with empiric antibiotics-vancomycin and ceftriaxone day 10 -  general surgery consulted-to the OR for incision and drainage 1/5  Emphysematous Cystitis  - Urine culture obtained when patient was already on antibiotics, therefore urine culture negative. - Continue with IV Rocephin - Urology was consulted- plans are to continue with antibiotics for a total of 2 weeks. Foley is in place, will discontinue over the next 1-2 days.  Acute renal failure - Multifactorial-dehydration, contrast, sepsis and possibly to obstruction - Creatinine significantly better, repeat electrolytes periodically - Nephrology consulted, and has signed off   Acute hypoxic respiratory failure - Suspected secondary to mild CHF decompensation - Continue oxygen, continue nebs, continue intravenous Lasix - Incentive spirometry, taper off oxygen  Acute diastolic heart failure with mild decompensation - Continue with IV Lasix, electrolytes will be checked periodically  Hypertension c/w amlodipine -will start low-dose beta blockers.  Diabetes - CBGs stable - Continue with anatomy and NovoLog  Anemia - Suspect secondary to acute illness/dilutional, no evidence of overt loss - Monitor hemoglobin and hematocrit  Hyperkalemia/hyponatremia - Resolved  Hypothyroidism - Continue Synthroid  Disposition: Remain inpatient  DVT Prophylaxis: Prophylactic Heparin   Code Status: Full code   Family Communication Mother at  bedside  Procedures:  None  CONSULTS:  nephrology, general surgery and urology   MEDICATIONS: Scheduled Meds: . amLODipine  10 mg Oral Daily  . aspirin EC  81 mg Oral Daily  . atorvastatin  80 mg Oral Daily  . budesonide-formoterol  2 puff Inhalation BID  . cefTRIAXone (ROCEPHIN)  IV  1 g Intravenous Q24H  . docusate sodium  100 mg Oral BID  . feeding supplement (GLUCERNA SHAKE)  237 mL Oral Daily  . ferrous sulfate  325 mg Oral TID WC  . FLUoxetine  20 mg Oral BID  . furosemide  40 mg Intravenous Daily  . gabapentin  100 mg Oral BID  . [START ON 08/26/2013] heparin  5,000 Units Subcutaneous Q8H  . insulin aspart  0-15 Units Subcutaneous TID WC  . insulin aspart  0-5 Units Subcutaneous QHS  . insulin aspart  20 Units Subcutaneous TID WC  . insulin detemir  140 Units Subcutaneous Daily  . ipratropium-albuterol  3 mL Nebulization TID  . levothyroxine  25 mcg Oral QAC breakfast  . metoCLOPramide  5 mg Oral TID  . multivitamin with minerals  1 tablet Oral Daily  . oxybutynin  5 mg Oral QAC breakfast  . pantoprazole  40 mg Oral Daily  . tiotropium  18 mcg Inhalation Daily  . vancomycin  1,000 mg Intravenous Q24H   Continuous Infusions:  PRN Meds:.acetaminophen, acetaminophen, haloperidol lactate, hydrALAZINE, morphine injection, ondansetron (ZOFRAN) IV, ondansetron, oxyCODONE, oxyCODONE-acetaminophen, polyethylene glycol  Antibiotics: Anti-infectives   Start     Dose/Rate Route Frequency Ordered Stop   08/23/13 0800  vancomycin (VANCOCIN) IVPB 1000 mg/200 mL premix     1,000 mg 200 mL/hr over 60 Minutes Intravenous Every 24 hours 08/23/13 0646     08/22/13 0915  vancomycin (VANCOCIN) IVPB 1000 mg/200 mL premix     1,000  mg 200 mL/hr over 60 Minutes Intravenous  Once 08/22/13 0905 08/22/13 1102   08/20/13 1100  vancomycin (VANCOCIN) IVPB 750 mg/150 ml premix     750 mg 150 mL/hr over 60 Minutes Intravenous  Once 08/20/13 1059 08/20/13 1407   08/19/13 1800  vancomycin  (VANCOCIN) 1,500 mg in sodium chloride 0.9 % 500 mL IVPB  Status:  Discontinued     1,500 mg 250 mL/hr over 120 Minutes Intravenous Every 48 hours 08/17/13 1800 08/17/13 1804   08/17/13 1900  vancomycin (VANCOCIN) 1,500 mg in sodium chloride 0.9 % 500 mL IVPB  Status:  Discontinued     1,500 mg 250 mL/hr over 120 Minutes Intravenous Every 48 hours 08/17/13 1804 08/18/13 1129   08/17/13 1830  vancomycin (VANCOCIN) 2,000 mg in sodium chloride 0.9 % 500 mL IVPB  Status:  Discontinued     2,000 mg 250 mL/hr over 120 Minutes Intravenous  Once 08/17/13 1759 08/17/13 1802   08/16/13 1200  vancomycin (VANCOCIN) IVPB 1000 mg/200 mL premix  Status:  Discontinued     1,000 mg 200 mL/hr over 60 Minutes Intravenous Every 12 hours 08/16/13 0344 08/16/13 0819   08/16/13 0900  cefTRIAXone (ROCEPHIN) 1 g in dextrose 5 % 50 mL IVPB     1 g 100 mL/hr over 30 Minutes Intravenous Every 24 hours 08/16/13 0819     08/16/13 0300  ertapenem (INVANZ) 1 g in sodium chloride 0.9 % 50 mL IVPB     1 g 100 mL/hr over 30 Minutes Intravenous  Once 08/16/13 0236 08/16/13 0349   08/15/13 2315  vancomycin (VANCOCIN) IVPB 1000 mg/200 mL premix     1,000 mg 200 mL/hr over 60 Minutes Intravenous  Once 08/15/13 2302 08/16/13 0316       PHYSICAL EXAM: Vital signs in last 24 hours: Filed Vitals:   08/24/13 2043 08/24/13 2146 08/25/13 0520 08/25/13 0910  BP:  161/62 130/50 162/75  Pulse: 59 62 66 63  Temp:  98.3 F (36.8 C) 98.3 F (36.8 C) 98.1 F (36.7 C)  TempSrc:  Oral Oral   Resp: 20 19 19 17   Height:      Weight:   105.325 kg (232 lb 3.2 oz)   SpO2: 94% 96% 95% 97%    Weight change: 1.225 kg (2 lb 11.2 oz) Filed Weights   08/22/13 2024 08/23/13 2014 08/25/13 0520  Weight: 104.101 kg (229 lb 8 oz) 104.101 kg (229 lb 8 oz) 105.325 kg (232 lb 3.2 oz)   Body mass index is 38.64 kg/(m^2).   Gen Exam: Awake and alert with clear speech.   Neck: Supple, No JVD.  Chest: B/L Clear.   CVS: S1 S2 Regular, no  murmurs.  Abdomen: soft, BS +, non tender, non distended.  Extremities: Fluctuant area on the left lateral thigh-approximately 5x7 cm in size. Right BKA Neurologic: Non Focal.   Skin: No Rash.   Wounds: N/A.    Intake/Output from previous day:  Intake/Output Summary (Last 24 hours) at 08/25/13 1057 Last data filed at 08/25/13 0800  Gross per 24 hour  Intake    360 ml  Output   2700 ml  Net  -2340 ml     LAB RESULTS: CBC  Recent Labs Lab 08/21/13 0455 08/22/13 0500 08/23/13 0525 08/24/13 0500 08/25/13 0722  WBC 11.3* 7.8 7.5 7.0 7.2  HGB 8.2* 7.6* 8.3* 8.3* 8.3*  HCT 24.8* 22.6* 25.4* 25.5* 26.7*  PLT 306 305 359 362 393  MCV 84.6 84.3 85.2 86.1  88.4  MCH 28.0 28.4 27.9 28.0 27.5  MCHC 33.1 33.6 32.7 32.5 31.1  RDW 14.3 14.2 14.2 14.3 14.8    Chemistries   Recent Labs Lab 08/20/13 1140 08/21/13 0455 08/22/13 0500 08/23/13 0525 08/25/13 0722  NA 141 139 141 144 142  K 4.4 4.3 4.6 4.1 4.7  CL 100 100 99 104 100  CO2 25 23 27 28 29   GLUCOSE 68* 79 124* 119* 132*  BUN 44* 43* 40* 33* 30*  CREATININE 3.77* 2.98* 2.06* 1.40* 1.13*  CALCIUM 8.8 8.9 8.7 8.7 8.7    CBG:  Recent Labs Lab 08/24/13 0722 08/24/13 1135 08/24/13 1657 08/24/13 2146 08/25/13 0739  GLUCAP 72 211* 127* 124* 132*    GFR Estimated Creatinine Clearance: 70.1 ml/min (by C-G formula based on Cr of 1.13).  Coagulation profile No results found for this basename: INR, PROTIME,  in the last 168 hours  Cardiac Enzymes No results found for this basename: CK, CKMB, TROPONINI, MYOGLOBIN,  in the last 168 hours  No components found with this basename: POCBNP,  No results found for this basename: DDIMER,  in the last 72 hours No results found for this basename: HGBA1C,  in the last 72 hours No results found for this basename: CHOL, HDL, LDLCALC, TRIG, CHOLHDL, LDLDIRECT,  in the last 72 hours No results found for this basename: TSH, T4TOTAL, FREET3, T3FREE, THYROIDAB,  in the last 72  hours No results found for this basename: VITAMINB12, FOLATE, FERRITIN, TIBC, IRON, RETICCTPCT,  in the last 72 hours No results found for this basename: LIPASE, AMYLASE,  in the last 72 hours  Urine Studies No results found for this basename: UACOL, UAPR, USPG, UPH, UTP, UGL, UKET, UBIL, UHGB, UNIT, UROB, ULEU, UEPI, UWBC, URBC, UBAC, CAST, CRYS, UCOM, BILUA,  in the last 72 hours  MICROBIOLOGY: Recent Results (from the past 240 hour(s))  MRSA PCR SCREENING     Status: None   Collection Time    08/16/13  4:22 AM      Result Value Range Status   MRSA by PCR NEGATIVE  NEGATIVE Final   Comment:            The GeneXpert MRSA Assay (FDA     approved for NASAL specimens     only), is one component of a     comprehensive MRSA colonization     surveillance program. It is not     intended to diagnose MRSA     infection nor to guide or     monitor treatment for     MRSA infections.  CULTURE, BLOOD (ROUTINE X 2)     Status: None   Collection Time    08/16/13 11:00 AM      Result Value Range Status   Specimen Description BLOOD RIGHT ARM   Final   Special Requests BOTTLES DRAWN AEROBIC AND ANAEROBIC 10CC   Final   Culture  Setup Time     Final   Value: 08/16/2013 18:50     Performed at 08/18/2013   Culture     Final   Value: NO GROWTH 5 DAYS     Performed at [facility]   Report Status 08/22/2013 FINAL   Final  CULTURE, BLOOD (ROUTINE X 2)     Status: None   Collection Time    08/16/13 11:15 AM      Result Value Range Status   Specimen Description BLOOD RIGHT ARM   Final   Special  Requests BOTTLES DRAWN AEROBIC AND ANAEROBIC 10CC   Final   Culture  Setup Time     Final   Value: 08/16/2013 18:50     Performed at [facility]   Culture     Final   Value: NO GROWTH 5 DAYS     Performed at [facility]   Report Status 08/22/2013 FINAL   Final  URINE CULTURE     Status: None   Collection Time    08/19/13 12:25 PM      Result Value Range Status    Specimen Description URINE, CATHETERIZED   Final   Special Requests PT ON CEFTRIAXONE VANCO   Final   Culture  Setup Time     Final   Value: 08/19/2013 17:57     Performed at [facility] Count     Final   Value: NO GROWTH     Performed at [facility]   Culture     Final   Value: NO GROWTH     Performed at [facility]   Report Status 08/20/2013 FINAL   Final  SURGICAL PCR SCREEN     Status: None   Collection Time    08/25/13  5:59 AM      Result Value Range Status   MRSA, PCR NEGATIVE  NEGATIVE Final   Staphylococcus aureus NEGATIVE  NEGATIVE Final   Comment:            The Xpert SA Assay (FDA     approved for NASAL specimens     in patients over 37 years of age),     is one component of     a comprehensive surveillance     program.  Test performance has     been validated by [facility] for patients greater     than or equal to 23 year old.     It is not intended     to diagnose infection nor to     guide or monitor treatment.    RADIOLOGY STUDIES/RESULTS: Ct Abdomen Pelvis Wo Contrast  08/16/2013   CLINICAL DATA:  Abdominal pain.  EXAM: CT ABDOMEN AND PELVIS WITHOUT CONTRAST  TECHNIQUE: Multidetector CT imaging of the abdomen and pelvis was performed following the standard protocol without intravenous contrast.  COMPARISON:  CT of the left femur performed 08/15/2013, and CT of the pelvis performed 01/23/2013  FINDINGS: The visualized lung bases are clear.  The liver and spleen are unremarkable in appearance. The gallbladder is within normal limits. The pancreas and left adrenal gland are unremarkable. A 1.2 cm right adrenal lesion is noted, too high in attenuation to characterize as an adrenal adenoma on this study.  Residual contrast is seen within the renal calyces. Nonspecific perinephric stranding is noted bilaterally. A small 1.0 cm hypodensity at the lower pole of the right kidney may reflect a small cyst. There is no evidence of  hydronephrosis. No definite obstructing ureteral stones are seen.  No free fluid is identified. The small bowel is unremarkable in appearance. The stomach is within normal limits. No acute vascular abnormalities are seen.  The appendix is normal in caliber and contains air, without evidence for appendicitis. The colon is unremarkable in appearance.  The bladder is largely filled with contrast. The previously noted debris and air in the bladder are displaced by contrast. The amount of debris seems decreased from the recent prior study; some of the debris may  have been passed in the urine. Scattered tiny foci of air are again seen outside the bladder wall; the appearance raises suspicion for emphysematous cystitis. The uterus is unremarkable in appearance. The ovaries are relatively symmetric; no suspicious adnexal masses are seen. Enlarged left inguinal nodes are again seen.  No acute osseous abnormalities are identified.  IMPRESSION: 1. Previously noted debris and air in the bladder are displaced by contrast. The amount of debris appears decreased from the recent prior study; some of the debris may have been passed in the urine. Scattered tiny foci of air again seen outside the bladder wall; the appearance raises suspicion for emphysematous cystitis. 2. Small right renal cyst. 3. 1.2 cm right adrenal lesion, too high in attenuation to characterize as an adrenal adenoma on this study. Adrenal protocol CT or MRI could be considered for further evaluation, when and as deemed clinically appropriate. 4. Enlarged left inguinal nodes again seen.   Electronically Signed   By: Roanna Raider M.D.   On: 08/16/2013 03:51   Ct Head Wo Contrast  08/20/2013   CLINICAL DATA:  Altered mental status.  EXAM: CT HEAD WITHOUT CONTRAST  TECHNIQUE: Contiguous axial images were obtained from the base of the skull through the vertex without intravenous contrast.  COMPARISON:  10/06/2011 CT.  05/03/2011 MR.  FINDINGS: No intracranial  hemorrhage.  No CT evidence of large acute infarct  No intracranial mass lesion noted on this unenhanced exam.  Calcification adjacent to the superior aspect of the falx unchanged and probably an incidental finding rather than and a meningioma.  Mastoid air cells, middle ear cavities and visualized paranasal sinuses are clear. Very mild exophthalmos.  IMPRESSION: No acute abnormality.  Please see above.   Electronically Signed   By: Bridgett Larsson M.D.   On: 08/20/2013 11:44   Ct Femur Left W Contrast  08/16/2013   CLINICAL DATA:  Left thigh swelling and erythema, with left lateral thigh pain just above the knee.  EXAM: CT OF THE LEFT FEMUR WITH CONTRAST  TECHNIQUE: Multidetector CT imaging of the left femur was performed according to the standard protocol following intravenous contrast administration. Multiplanar CT image reconstructions were also generated.  CONTRAST:  OMNIPAQUE IOHEXOL 300 MG/ML  SOLN  COMPARISON:  Left knee radiographs performed 01/16/2006, and CT of the pelvis performed 01/23/2013  FINDINGS: There is diffuse soft tissue inflammation and edema along the lateral aspect of the left thigh, extending inferiorly below the left knee. Associated significant skin thickening is seen. Findings are most compatible with cellulitis. There is no evidence of underlying abscess at this time. There is minimal associated soft tissue edema underlying the fascia, though the underlying musculature is generally intact.  There is no evidence of vascular compromise. Very small knee joint effusion is noted. The knee joint is otherwise grossly unremarkable in appearance. Enlarged left inguinal nodes are seen, measuring up to 1.6 cm in short axis. Scattered prominent collateral vessels are noted along the visualized anterior abdominal wall.  The left hip joint is unremarkable in appearance. No hip joint effusion is identified. The visualized portions of the uterus are grossly unremarkable. Note is made of a large  amount of debris and solid material filling the bladder, with trace air noted outside of the bladder. This appears new from the CT of the pelvis performed 01/23/2013.  No acute osseous abnormalities are identified.  IMPRESSION: 1. Diffuse soft tissue inflammation and edema along the lateral aspect of the left thigh, extending inferiorly below the left  knee, with associated skin thickening, compatible with cellulitis. No evidence of underlying abscess at this time. Minimal associated soft tissue edema seen underlying the fascia, though the underlying musculature is grossly intact. 2. There is small knee joint effusion noted. 3. Left inguinal lymphadenopathy seen, with nodes measuring up to 1.6 cm in short axis. 4. Prominent collateral vessels noted along the anterior abdominal wall. 5. Large amount of debris and solid material filling the bladder, with trace air noted outside the bladder. This is new from the CT of the pelvis performed 01/23/2013. This is a very unusual appearance, and could conceivably reflect a colovesical fistula, or possibly severe emphysematous cystitis. Would correlate for associated symptoms.  These results were called by telephone at the time of interpretation on 08/16/2013 at 1:27 AM to Dr. Eber HongBRIAN MILLER , who verbally acknowledged these results.   Electronically Signed   By: Roanna RaiderJeffery  Chang M.D.   On: 08/16/2013 01:29   [place] Renal  08/19/2013   CLINICAL DATA:  Increasing creatinine.  EXAM: RENAL/URINARY TRACT ULTRASOUND COMPLETE  COMPARISON:  CT abdomen and pelvis 08/16/2013  FINDINGS: Right Kidney:  Length: 13.1 cm. Normal parenchymal thickness with mild increased cortical echotexture. Changes suggest medical renal disease. No hydronephrosis or solid mass.  Left Kidney:  Length: 12.4 cm. Normal parenchymal thickness with mild increased cortical echotexture. Changes suggest medical renal disease. No hydronephrosis or solid mass.  Bladder:  Bladder is decompressed and not visualized.   IMPRESSION: The Mild increased parenchymal echotexture in both kidneys suggesting medical renal disease. No evidence of hydronephrosis. No significant atrophy.   Electronically Signed   By: Burman NievesWilliam  Stevens M.D.   On: 08/19/2013 Delanna Ahmadi02:01    Zigmond Trela, MD  [facility] Pager:[phone]  If 7PM-7AM, please contact night-coverage [email or web address] Password [email or web address] 08/25/2013, 10:57 AM   LOS: 10 days

## 2013-08-25 NOTE — Progress Notes (Signed)
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 Has been on ABX since 12/27, new UCX obtained (12/30) and negative. NO h/o inflammatory bowel disease, diverticulosis/diverticulitis, or pelvic radiation. No baseline voiding complaints. Foley placed 12/30 for maximal drainage and decrease bladder wall tension while healing.  2 - Acute Renal Failure - Pt with acute rise in Cr to 5.6 range from baseline 1.5 during admission. CT w/o hydro / stones / or other s/s obstruction. She has received IV contrast, IV ABX including Vancomycin, and suspect SIRS. Peak Cr >5 and now trending back down.   Today [name] is seen in f/u above. Remains afebrile, but going to OR today for I+D of organizing likley abscess on left leg. Foley working well, urine clearing but still with some sediment.   Objective: Vital signs in last 24 hours: Temp:  [97.7 F (36.5 C)-98.3 F (36.8 C)] 98.1 F (36.7 C) (01/05 0910) Pulse Rate:  [59-66] 63 (01/05 0910) Resp:  [17-20] 17 (01/05 0910) BP: (130-184)/(50-88) 162/75 mmHg (01/05 0910) SpO2:  [89 %-97 %] 97 % (01/05 0910) FiO2 (%):  [28 %] 28 % (01/04 1614) Weight:  [105.325 kg (232 lb 3.2 oz)] 105.325 kg (232 lb 3.2 oz) (01/05 0520) Last BM Date: 08/22/13  Intake/Output from previous day: 01/04 0701 - 01/05 0700 In: 480 [P.O.:480] Out: 2700 [Urine:2700] Intake/Output this shift:    General appearance: alert, cooperative, appears stated age and Much more lucid today. Head: Normocephalic, without obvious abnormality, atraumatic Eyes: conjunctivae/corneas clear. PERRL, EOM's intact. Fundi benign. Ears: normal TM's and external ear canals both ears Nose: Nares normal. Septum midline. Mucosa normal. No drainage or sinus tenderness. Throat: lips, mucosa, and tongue normal; teeth and gums  normal Neck: no adenopathy, no carotid bruit, no JVD, supple, symmetrical, trachea midline and thyroid not enlarged, symmetric, no tenderness/mass/nodules Back: symmetric, no curvature. ROM normal. No CVA tenderness. Resp: clear to auscultation bilaterally Chest wall: no tenderness Cardio: regular rate and rhythm, S1, S2 normal, no murmur, click, rub or gallop GI: soft, non-tender; bowel sounds normal; no masses,  no organomegaly Pelvic: external genitalia normal and foley c/d/i with clearing urine, still some sediment but greatly improved. Extremities: s/p Rt BKA. LLE with organized colleciton left thigh, erythema surroudning improved. Skin: Skin color, texture, turgor normal. No rashes or lesions Lymph nodes: Cervical, supraclavicular, and axillary nodes normal. Neurologic: Grossly normal  Lab Results:   Recent Labs  08/24/13 0500 08/25/13 0722  WBC 7.0 7.2  HGB 8.3* 8.3*  HCT 25.5* 26.7*  PLT 362 393   BMET  Recent Labs  08/23/13 0525 08/25/13 0722  NA 144 142  K 4.1 4.7  CL 104 100  CO2 28 29  GLUCOSE 119* 132*  BUN 33* 30*  CREATININE 1.40* 1.13*  CALCIUM 8.7 8.7   PT/INR No results found for this basename: LABPROT, INR,  in the last 72 hours ABG  Recent Labs  08/22/13 1411  PHART 7.444  HCO3 25.9*    Studies/Results: Dg Chest Port 1 View  08/24/2013   CLINICAL DATA:  Short of breath  EXAM: PORTABLE CHEST - 1 VIEW  COMPARISON:  DG CHEST 2V dated 07/09/2013  FINDINGS: Exam is lordotic. Interval enlargement of the cardiac silhouette. There are low lung volumes. There is increased perihilar fine airspace disease compared to prior. No pneumothorax.  IMPRESSION: New cardiomegaly  and pulmonary edema suggests congestive heart failure.   Electronically Signed   By: Genevive Bi M.D.   On: 08/24/2013 16:38    Anti-infectives: Anti-infectives   Start     Dose/Rate Route Frequency Ordered Stop   08/23/13 0800  vancomycin (VANCOCIN) IVPB 1000 mg/200 mL premix      1,000 mg 200 mL/hr over 60 Minutes Intravenous Every 24 hours 08/23/13 0646     08/22/13 0915  vancomycin (VANCOCIN) IVPB 1000 mg/200 mL premix     1,000 mg 200 mL/hr over 60 Minutes Intravenous  Once 08/22/13 0905 08/22/13 1102   08/20/13 1100  vancomycin (VANCOCIN) IVPB 750 mg/150 ml premix     750 mg 150 mL/hr over 60 Minutes Intravenous  Once 08/20/13 1059 08/20/13 1407   08/19/13 1800  vancomycin (VANCOCIN) 1,500 mg in sodium chloride 0.9 % 500 mL IVPB  Status:  Discontinued     1,500 mg 250 mL/hr over 120 Minutes Intravenous Every 48 hours 08/17/13 1800 08/17/13 1804   08/17/13 1900  vancomycin (VANCOCIN) 1,500 mg in sodium chloride 0.9 % 500 mL IVPB  Status:  Discontinued     1,500 mg 250 mL/hr over 120 Minutes Intravenous Every 48 hours 08/17/13 1804 08/18/13 1129   08/17/13 1830  vancomycin (VANCOCIN) 2,000 mg in sodium chloride 0.9 % 500 mL IVPB  Status:  Discontinued     2,000 mg 250 mL/hr over 120 Minutes Intravenous  Once 08/17/13 1759 08/17/13 1802   08/16/13 1200  vancomycin (VANCOCIN) IVPB 1000 mg/200 mL premix  Status:  Discontinued     1,000 mg 200 mL/hr over 60 Minutes Intravenous Every 12 hours 08/16/13 0344 08/16/13 0819   08/16/13 0900  cefTRIAXone (ROCEPHIN) 1 g in dextrose 5 % 50 mL IVPB     1 g 100 mL/hr over 30 Minutes Intravenous Every 24 hours 08/16/13 0819     08/16/13 0300  ertapenem (INVANZ) 1 g in sodium chloride 0.9 % 50 mL IVPB     1 g 100 mL/hr over 30 Minutes Intravenous  Once 08/16/13 0236 08/16/13 0349   08/15/13 2315  vancomycin (VANCOCIN) IVPB 1000 mg/200 mL premix     1,000 mg 200 mL/hr over 60 Minutes Intravenous  Once 08/15/13 2302 08/16/13 0316      Assessment/Plan:  1 - Emphysematous Cystitis - impressive case, likely due to underlying hyperglysemia / glycosuria. Rec continue catheter until systemic infectious parameters and need for close UOP monitoring subsided, as well as at least 2 weeks CX-specifc ABX. Should current UCX remain  negative, would then likely continue cephalosporin or similar or PO equivalent for course. I do not favor any sort of GI-GU fistula given gas pattern and lack of risk-factors for such.   COntinue foley for now.   2 - Acute Renal Failure - Low suspicion for obstructive etiology given lack of hydro, though possible. Suspect acute medial renal stress from contrast, hypotension, ABX as likely cause.   No indication for GU operative intervention. Will continue to follow prn while in house. Please call anytime with questions.    [facility], Janssen Zee 08/25/2013

## 2013-08-25 NOTE — Op Note (Signed)
08/25/2013  3:56 PM  PATIENT:  [name]  53 y.o. female  PRE-OPERATIVE DIAGNOSIS:  Left Leg Abscess  POST-OPERATIVE DIAGNOSIS:  Left Leg Abscess  PROCEDURE:  Procedure(s): IRRIGATION AND DEBRIDEMENT EXTREMITY (Left)  SURGEON:  Surgeon(s) and Role:    * Axel Filler, MD - Primary  PHYSICIAN ASSISTANT: none  ASSISTANTS: none   ANESTHESIA:   IV sedation  EBL:     BLOOD ADMINISTERED:none  DRAINS: none   LOCAL MEDICATIONS USED:  BUPIVICAINE   SPECIMEN:  Source of Specimen:  L Lateral leg  DISPOSITION OF SPECIMEN:  Microbiology  COUNTS:  YES  TOURNIQUET:  * No tourniquets in log *  DICTATION: .Dragon Dictation The patient was taking back to the operating room after obtaining consent.  She was placed in the supine position bilateral SCDs in place.  She was then prepped and draped in the usual sterile fashion.  After appropriate anesthesia 1% Marcaine with epinephrine was used as a field block. At this time a 15 blade was used to excise a portion of skin in an elliptical fashion. Cultures were taken from this area. Purulence was easily expressed. The area tracked cephalad approximately 4-5 cm. The loculations were broken up. 1 inch iodoform gauze was then used to pack the wound. The wounds and dressed with 4 x 4's, ABDs, cultural common and ace wrap.   The patient tolerated the procedure well taken to recovery in stable condition.  PLAN OF CARE: Admitted  PATIENT DISPOSITION:  PACU - hemodynamically stable.   Delay start of Pharmacological VTE agent (>24hrs) due to surgical blood loss or risk of bleeding: no

## 2013-08-25 NOTE — Progress Notes (Signed)
I have seen and examined the pt and agree with PA-Osborne's progress note.  

## 2013-08-25 NOTE — Progress Notes (Signed)
ANTIBIOTIC CONSULT NOTE - FOLLOW UP  Pharmacy Consult for vancomycin Indication: cellulitis   Labs:  Recent Labs  08/23/13 0525 08/24/13 0500 08/25/13 0722  WBC 7.5 7.0 7.2  HGB 8.3* 8.3* 8.3*  PLT 359 362 393  CREATININE 1.40*  --  1.13*   Estimated Creatinine Clearance: 70.1 ml/min (by C-G formula based on Cr of 1.13).  Recent Labs  08/24/13 0712 08/25/13 0722  VANCORANDOM 15.1 16.6    Assessment/Plan:  52yo female with sepsis 2/2 cellulitis of left leg. Currently on CTX and vancomycin. Blood Cx with no growth to date, afebrile, wbc wnl. Renal function now much improved, Scr down to 1.13. Random vanc level slightly above goal at 16.6 -- drawn approximately 2 hours prior to trough time, so within therapeutic range  Goal of Therapy:  Vancomycin trough level 10-15 mcg/ml   Plan:  1) Continue with Vancomycin 1 gm IV q24 hours 2) Continue Ceftriaxone 1 gm IV Q 24 hours  3) Follow up LOT  Thank you. Okey Regal, PharmD [phone]  08/25/2013 10:28 AM

## 2013-08-25 NOTE — Anesthesia Preprocedure Evaluation (Signed)
Anesthesia Evaluation  Patient identified by MRN, date of birth, ID band Patient awake    Airway Mallampati: II      Dental   Pulmonary shortness of breath, Current Smoker,  breath sounds clear to auscultation        Cardiovascular hypertension, + Peripheral Vascular Disease, +CHF and + Orthopnea Rhythm:Regular Rate:Normal     Neuro/Psych  Headaches, Anxiety Depression  Neuromuscular disease    GI/Hepatic   Endo/Other  diabetes  Renal/GU Renal disease     Musculoskeletal  (+) Arthritis -,   Abdominal   Peds  Hematology   Anesthesia Other Findings   Reproductive/Obstetrics                           Anesthesia Physical Anesthesia Plan  ASA: III  Anesthesia Plan: General   Post-op Pain Management:    Induction: Intravenous  Airway Management Planned: LMA  Additional Equipment:   Intra-op Plan:   Post-operative Plan: Extubation in OR  Informed Consent: I have reviewed the patients History and Physical, chart, labs and discussed the procedure including the risks, benefits and alternatives for the proposed anesthesia with the patient or authorized representative who has indicated his/her understanding and acceptance.   Dental advisory given  Plan Discussed with: CRNA and Surgeon  Anesthesia Plan Comments:         Anesthesia Quick Evaluation

## 2013-08-25 NOTE — Transfer of Care (Signed)
Immediate Anesthesia Transfer of Care Note  Patient: [name]  Procedure(s) Performed: Procedure(s): IRRIGATION AND DEBRIDEMENT EXTREMITY (Left)  Patient Location: PACU  Anesthesia Type:MAC  Level of Consciousness: awake, alert  and oriented  Airway & Oxygen Therapy: Patient Spontanous Breathing and Patient connected to face mask oxygen  Post-op Assessment: Report given to PACU RN and Post -op Vital signs reviewed and stable  Post vital signs: Reviewed and stable  Complications: No apparent anesthesia complications

## 2013-08-25 NOTE — Progress Notes (Signed)
PT Cancellation Note  Patient Details Name: [name] MRN: [id number] DOB: [birth year]   Cancelled Treatment:    Reason Eval/Treat Not Completed: Pain limiting ability to participate; reports scheduled for surgery today and currently NPO.  Prefers to try to mobilize after surgery.  Will check on pt tomorrow.   Daneil Beem,CYNDI 08/25/2013, 9:52 AM

## 2013-08-26 LAB — BASIC METABOLIC PANEL
BUN: 30 mg/dL — ABNORMAL HIGH (ref 6–23)
BUN: 32 mg/dL — ABNORMAL HIGH (ref 6–23)
CHLORIDE: 96 meq/L (ref 96–112)
CO2: 31 mEq/L (ref 19–32)
CO2: 33 mEq/L — ABNORMAL HIGH (ref 19–32)
Calcium: 8.8 mg/dL (ref 8.4–10.5)
Calcium: 9 mg/dL (ref 8.4–10.5)
Chloride: 100 mEq/L (ref 96–112)
Creatinine, Ser: 1.14 mg/dL — ABNORMAL HIGH (ref 0.50–1.10)
Creatinine, Ser: 1.18 mg/dL — ABNORMAL HIGH (ref 0.50–1.10)
GFR calc Af Amer: 63 mL/min — ABNORMAL LOW (ref >90)
GFR calc Af Amer: 60 mL/min — ABNORMAL LOW (ref >90)
GFR calc non Af Amer: 54 mL/min — ABNORMAL LOW (ref >90)
GFR calc non Af Amer: 52 mL/min — ABNORMAL LOW (ref >90)
GLUCOSE: 110 mg/dL — AB (ref 70–99)
Glucose, Bld: 207 mg/dL — ABNORMAL HIGH (ref 70–99)
POTASSIUM: 6.3 meq/L — AB (ref 3.7–5.3)
Potassium: 5.3 mEq/L (ref 3.7–5.3)
SODIUM: 138 meq/L (ref 137–147)
SODIUM: 145 meq/L (ref 137–147)

## 2013-08-26 LAB — GLUCOSE, CAPILLARY
Glucose-Capillary: 119 mg/dL — ABNORMAL HIGH (ref 70–99)
Glucose-Capillary: 196 mg/dL — ABNORMAL HIGH (ref 70–99)
Glucose-Capillary: 183 mg/dL — ABNORMAL HIGH (ref 70–99)
Glucose-Capillary: 101 mg/dL — ABNORMAL HIGH (ref 70–99)

## 2013-08-26 MED ORDER — DEXTROSE 50 % IV SOLN
1.0000 | Freq: Once | INTRAVENOUS | Status: AC
  Administered 2013-08-26: 50 mL via INTRAVENOUS
  Filled 2013-08-26: qty 50

## 2013-08-26 MED ORDER — INSULIN ASPART 100 UNIT/ML ~~LOC~~ SOLN
8.0000 [IU] | Freq: Once | SUBCUTANEOUS | Status: AC
  Administered 2013-08-26: 8 [IU] via INTRAVENOUS

## 2013-08-26 MED ORDER — MORPHINE SULFATE 2 MG/ML IJ SOLN
2.0000 mg | Freq: Once | INTRAMUSCULAR | Status: AC
  Administered 2013-08-26: 2 mg via INTRAVENOUS
  Filled 2013-08-26: qty 1

## 2013-08-26 MED ORDER — SODIUM POLYSTYRENE SULFONATE 15 GM/60ML PO SUSP
30.0000 g | Freq: Once | ORAL | Status: AC
  Administered 2013-08-26: 30 g via ORAL
  Filled 2013-08-26: qty 120

## 2013-08-26 NOTE — Progress Notes (Signed)
Occupational Therapy Treatment Patient Details Name: [name] MRN: [id number] DOB: 1961-03-04 Today's Date: 08/26/2013 Time: 7169-6789 OT Time Calculation (min): 11 min  OT Assessment / Plan / Recommendation  History of present illness Patient is a 53 yo female admitted with 9 day history of LLE edema and reddness following a fall.  Patient dx with cellulitis.  Patient with h/o DM with neuropathy, Rt BKA with prosthesis, HTN, PVD, CHF, anxiety.   OT comments  Pt doing well and making progress with functional goals. Pt to continue with acute OT services to address impairments to help restore PLOF to return home safely  Follow Up Recommendations  Home health OT;Supervision/Assistance - 24 hour    Barriers to Discharge   none    Equipment Recommendations  3 in 1 bedside comode    Recommendations for Other Services    Frequency Min 2X/week   Progress towards OT Goals Progress towards OT goals: Progressing toward goals  Plan Discharge plan remains appropriate    Precautions / Restrictions Precautions Precautions: Fall Required Braces or Orthoses: Other Brace/Splint Other Brace/Splint: Rt LE prosthesis   Restrictions Weight Bearing Restrictions: No   Pertinent Vitals/Pain 8/10 L LE, pt premedicated    ADL  Lower Body Bathing: Simulated;Min guard;Supervision/safety Lower Body Dressing: Supervision/safety;Min guard;Performed Toilet Transfer: Performed;Min guard;Supervision/safety Toilet Transfer Method: Sit to Barista: Materials engineer and Hygiene: Performed;Supervision/safety;Min guard Where Assessed - Engineer, mining and Hygiene: Standing Transfers/Ambulation Related to ADLs: min guard A for safety ADL Comments: Pt able to verbalize 3 energy conservation techniques during ADLhome mgt tasks    OT Diagnosis:    OT Problem List:   OT Treatment Interventions:     OT Goals(current goals can now be  found in the care plan section) Acute Rehab OT Goals Patient Stated Goal: go home  Visit Information  Last OT Received On: 08/26/13 History of Present Illness: Patient is a 53 yo female admitted with 9 day history of LLE edema and reddness following a fall.  Patient dx with cellulitis.  Patient with h/o DM with neuropathy, Rt BKA with prosthesis, HTN, PVD, CHF, anxiety.    Subjective Data      Prior Functioning       Cognition  Cognition Arousal/Alertness: Awake/alert Behavior During Therapy: WFL for tasks assessed/performed Overall Cognitive Status: Within Functional Limits for tasks assessed    Mobility  Bed Mobility Bed Mobility: Supine to Sit;Sitting - Scoot to [place] of Bed Sitting - Scoot to Edge of Bed: 6: Modified independent (Device/Increase time) Scooting to [facility]: 6: Modified independent (Device/Increase time) Transfers Transfers: Sit to Stand;Stand to Sit Sit to Stand: 4: Min guard;With upper extremity assist;From bed;From toilet Stand to Sit: 5: Supervision;To toilet;To bed    Exercises      Balance Static Sitting Balance Static Sitting - Balance Support: Feet supported;No upper extremity supported Static Sitting - Level of Assistance: 7: Independent Dynamic Sitting Balance Dynamic Sitting - Balance Support: Feet supported;During functional activity;No upper extremity supported Dynamic Sitting - Level of Assistance: 5: Stand by assistance   End of Session OT - End of Session Equipment Utilized During Treatment: Gait belt;Other (comment) (BSC) Activity Tolerance: Patient tolerated treatment well Patient left: in bed;with call bell/phone within reach;Other (comment) (sitting EOB)  GO     Margaretmary Eddy [facility] 08/26/2013, 3:01 PM

## 2013-08-26 NOTE — Progress Notes (Signed)
NUTRITION FOLLOW-UP  DOCUMENTATION CODES Per approved criteria  -Obesity Unspecified   INTERVENTION: Continue Glucerna Shake once daily. RD to continue to follow nutrition care plan.  NUTRITION DIAGNOSIS: Inadequate oral intake related to acute illness as evidenced by PO intake 0-30% of meals. Improving.  Goal: Pt to meet >/= 90% of their estimated nutrition needs.   Monitor:  PO intake, Weight, Labs  ASSESSMENT: 53 y.o. female with past medical history of diabetes type 2 with hemoglobin A1c of 12.9 back on June, right BKA chronic diastolic heart failure, and hypertension that comes in for left lower extremity swelling and redness that started about 9 days prior to admission after a fall. She relates she's been having fevers and chills at home accompanied by nausea and vomiting unable to tolerate the clindamycin that her primary care doctor prescribe her 3 days ago she decided to come to the ED.  Underwent I&D of L leg abscess on 1/5.  Oral intake has improved, pt eating on average 75% of meals. She states that she is drinking her Glucerna Shakes daily, encouraged her to continue to do so to promote healing.  Sodium is now WNL Potassium elevated at 6.3  Height: Ht Readings from Last 1 Encounters:  08/16/13 5\' 5"  (1.651 m)    Weight: Wt Readings from Last 1 Encounters:  08/25/13 228 lb 12.8 oz (103.783 kg)  Admit wt 213 lb - wt trending up  Estimated Nutritional Needs: Kcal: 1700-1900 Protein: 100-110 grams Fluid: approx 2 L/day  Skin:  L leg incision diabetic ulcer of left foot abrasions on right knee  Diet Order: Carb Control Medium (1600 - 2000)  EDUCATION NEEDS: -No education needs identified at this time   Intake/Output Summary (Last 24 hours) at 08/26/13 1156 Last data filed at 08/26/13 0900  Gross per 24 hour  Intake    860 ml  Output   3065 ml  Net  -2205 ml    Last BM: 1/4  Labs:   Recent Labs Lab 08/23/13 0525 08/25/13 0722  08/26/13 0430  NA 144 142 138  K 4.1 4.7 6.3*  CL 104 100 96  CO2 28 29 31   BUN 33* 30* 30*  CREATININE 1.40* 1.13* 1.14*  CALCIUM 8.7 8.7 8.8  GLUCOSE 119* 132* 207*    CBG (last 3)   Recent Labs  08/25/13 1714 08/25/13 2208 08/26/13 0755  GLUCAP 106* 235* 196*    Scheduled Meds: . amLODipine  10 mg Oral Daily  . aspirin EC  81 mg Oral Daily  . atorvastatin  80 mg Oral Daily  . budesonide-formoterol  2 puff Inhalation BID  . cefTRIAXone (ROCEPHIN)  IV  1 g Intravenous Q24H  . docusate sodium  100 mg Oral BID  . feeding supplement (GLUCERNA SHAKE)  237 mL Oral Daily  . ferrous sulfate  325 mg Oral TID WC  . FLUoxetine  20 mg Oral BID  . furosemide  40 mg Intravenous Daily  . gabapentin  100 mg Oral BID  . heparin  5,000 Units Subcutaneous Q8H  . insulin aspart  0-15 Units Subcutaneous TID WC  . insulin aspart  0-5 Units Subcutaneous QHS  . insulin aspart  20 Units Subcutaneous TID WC  . insulin detemir  140 Units Subcutaneous Daily  . ipratropium-albuterol  3 mL Nebulization TID  . levothyroxine  25 mcg Oral QAC breakfast  . metoCLOPramide  5 mg Oral TID  . metoprolol tartrate  12.5 mg Oral BID  . multivitamin with minerals  1 tablet Oral Daily  . oxybutynin  5 mg Oral QAC breakfast  . pantoprazole  40 mg Oral Daily  . tiotropium  18 mcg Inhalation Daily  . vancomycin  1,000 mg Intravenous Q24H    Continuous Infusions:    Jarold Motto MS, RD, LDN Pager: [phone] After-hours pager: [phone]

## 2013-08-26 NOTE — Progress Notes (Signed)
PATIENT DETAILS Name: [name] Age: 53 y.o. Sex: female Date of Birth: [birth year] Admit Date: 08/15/2013 Admitting Physician Ron Parker, MD GYF:VCBSWH, DAVID C, MD  Subjective: No major complaints overnight except pain in the incision and drainage site.  Assessment/Plan: Sepsis secondary to Cellulitis of left leg - Sepsis has resolved with IV antibiotics and IV fluids - Blood culture on 08/16/13 negative - Urine culture on 08/19/13 negative  Left thigh cellulitis with abscess formation - Continue with empiric antibiotics-vancomycin and ceftriaxone day 11 -  general surgery consulted-to the OR for incision and drainage 1/5 - Intraoperative wound care cultures negative so far  Emphysematous Cystitis  - Urine culture obtained when patient was already on antibiotics, therefore urine culture negative. - Continue with IV Rocephin- plan for a total of 14 days of antibiotics - Urology was consulted- plans are to continue with antibiotics for a total of 2 weeks. Foley to be discontinued today  Acute renal failure - Multifactorial-dehydration, contrast, sepsis and possibly to obstruction - Creatinine significantly better, repeat electrolytes periodically - Nephrology consulted, and has signed off   Acute hypoxic respiratory failure - Suspected secondary to mild CHF decompensation/COPD - Continue oxygen, continue nebs, continue intravenous Lasix - Incentive spirometry, taper off oxygen  Acute diastolic heart failure with mild decompensation - Continue with IV Lasix, electrolytes will be checked periodically  Hyperkalemia -? Etiology - Noted on 1/6-treated with IV insulin/D50/Kayexalate-plan to repeat chemistry panel later this afternoon  Hypertension c/w amlodipine - Controlled with low-dose beta blockers.  Diabetes - CBGs stable - Continue with Levemir and NovoLog  Anemia - Suspect secondary to acute illness/dilutional, no evidence of overt loss -  Monitor hemoglobin and hematocrit  Hyperkalemia/hyponatremia - Resolved  Hypothyroidism - Continue Synthroid  Disposition: Remain inpatient- home 1/7  DVT Prophylaxis: Prophylactic Heparin   Code Status: Full code   Family Communication Mother at bedside  Procedures:  None  CONSULTS:  nephrology, general surgery and urology   MEDICATIONS: Scheduled Meds: . amLODipine  10 mg Oral Daily  . aspirin EC  81 mg Oral Daily  . atorvastatin  80 mg Oral Daily  . budesonide-formoterol  2 puff Inhalation BID  . cefTRIAXone (ROCEPHIN)  IV  1 g Intravenous Q24H  . docusate sodium  100 mg Oral BID  . feeding supplement (GLUCERNA SHAKE)  237 mL Oral Daily  . ferrous sulfate  325 mg Oral TID WC  . FLUoxetine  20 mg Oral BID  . furosemide  40 mg Intravenous Daily  . gabapentin  100 mg Oral BID  . heparin  5,000 Units Subcutaneous Q8H  . insulin aspart  0-15 Units Subcutaneous TID WC  . insulin aspart  0-5 Units Subcutaneous QHS  . insulin aspart  20 Units Subcutaneous TID WC  . insulin detemir  140 Units Subcutaneous Daily  . ipratropium-albuterol  3 mL Nebulization TID  . levothyroxine  25 mcg Oral QAC breakfast  . metoCLOPramide  5 mg Oral TID  . metoprolol tartrate  12.5 mg Oral BID  . multivitamin with minerals  1 tablet Oral Daily  . oxybutynin  5 mg Oral QAC breakfast  . pantoprazole  40 mg Oral Daily  . tiotropium  18 mcg Inhalation Daily  . vancomycin  1,000 mg Intravenous Q24H   Continuous Infusions:  PRN Meds:.acetaminophen, acetaminophen, fentaNYL, haloperidol lactate, hydrALAZINE, morphine injection, ondansetron (ZOFRAN) IV, ondansetron, oxyCODONE-acetaminophen, polyethylene glycol  Antibiotics: Anti-infectives   Start     Dose/Rate Route Frequency Ordered Stop  08/23/13 0800  vancomycin (VANCOCIN) IVPB 1000 mg/200 mL premix     1,000 mg 200 mL/hr over 60 Minutes Intravenous Every 24 hours 08/23/13 0646     08/22/13 0915  vancomycin (VANCOCIN) IVPB 1000  mg/200 mL premix     1,000 mg 200 mL/hr over 60 Minutes Intravenous  Once 08/22/13 0905 08/22/13 1102   08/20/13 1100  vancomycin (VANCOCIN) IVPB 750 mg/150 ml premix     750 mg 150 mL/hr over 60 Minutes Intravenous  Once 08/20/13 1059 08/20/13 1407   08/19/13 1800  vancomycin (VANCOCIN) 1,500 mg in sodium chloride 0.9 % 500 mL IVPB  Status:  Discontinued     1,500 mg 250 mL/hr over 120 Minutes Intravenous Every 48 hours 08/17/13 1800 08/17/13 1804   08/17/13 1900  vancomycin (VANCOCIN) 1,500 mg in sodium chloride 0.9 % 500 mL IVPB  Status:  Discontinued     1,500 mg 250 mL/hr over 120 Minutes Intravenous Every 48 hours 08/17/13 1804 08/18/13 1129   08/17/13 1830  vancomycin (VANCOCIN) 2,000 mg in sodium chloride 0.9 % 500 mL IVPB  Status:  Discontinued     2,000 mg 250 mL/hr over 120 Minutes Intravenous  Once 08/17/13 1759 08/17/13 1802   08/16/13 1200  vancomycin (VANCOCIN) IVPB 1000 mg/200 mL premix  Status:  Discontinued     1,000 mg 200 mL/hr over 60 Minutes Intravenous Every 12 hours 08/16/13 0344 08/16/13 0819   08/16/13 0900  cefTRIAXone (ROCEPHIN) 1 g in dextrose 5 % 50 mL IVPB     1 g 100 mL/hr over 30 Minutes Intravenous Every 24 hours 08/16/13 0819     08/16/13 0300  ertapenem (INVANZ) 1 g in sodium chloride 0.9 % 50 mL IVPB     1 g 100 mL/hr over 30 Minutes Intravenous  Once 08/16/13 0236 08/16/13 0349   08/15/13 2315  vancomycin (VANCOCIN) IVPB 1000 mg/200 mL premix     1,000 mg 200 mL/hr over 60 Minutes Intravenous  Once 08/15/13 2302 08/16/13 0316       PHYSICAL EXAM: Vital signs in last 24 hours: Filed Vitals:   08/25/13 2119 08/25/13 2209 08/26/13 0444 08/26/13 0926  BP:  129/60 109/41 123/50  Pulse:  61 54 55  Temp:  98.3 F (36.8 C) 98.5 F (36.9 C) 98.4 F (36.9 C)  TempSrc:  Oral Oral Oral  Resp:  15 15 16   Height:      Weight:  103.783 kg (228 lb 12.8 oz)    SpO2: 98% 98% 95% 94%    Weight change: -1.542 kg (-3 lb 6.4 oz) Filed Weights    08/23/13 2014 08/25/13 0520 08/25/13 2209  Weight: 104.101 kg (229 lb 8 oz) 105.325 kg (232 lb 3.2 oz) 103.783 kg (228 lb 12.8 oz)   Body mass index is 38.07 kg/(m^2).   Gen Exam: Awake and alert with clear speech.   Neck: Supple, No JVD.  Chest: B/L Clear.   CVS: S1 S2 Regular, no murmurs.  Abdomen: soft, BS +, non tender, non distended.  Extremities: Left thigh in bandage-did not open Neurologic: Non Focal.   Skin: No Rash.   Wounds: N/A.    Intake/Output from previous day:  Intake/Output Summary (Last 24 hours) at 08/26/13 1159 Last data filed at 08/26/13 0900  Gross per 24 hour  Intake    860 ml  Output   3065 ml  Net  -2205 ml     LAB RESULTS: CBC  Recent Labs Lab 08/21/13 0455 08/22/13 0500 08/23/13  1829 08/24/13 0500 08/25/13 0722  WBC 11.3* 7.8 7.5 7.0 7.2  HGB 8.2* 7.6* 8.3* 8.3* 8.3*  HCT 24.8* 22.6* 25.4* 25.5* 26.7*  PLT 306 305 359 362 393  MCV 84.6 84.3 85.2 86.1 88.4  MCH 28.0 28.4 27.9 28.0 27.5  MCHC 33.1 33.6 32.7 32.5 31.1  RDW 14.3 14.2 14.2 14.3 14.8    Chemistries   Recent Labs Lab 08/21/13 0455 08/22/13 0500 08/23/13 0525 08/25/13 0722 08/26/13 0430  NA 139 141 144 142 138  K 4.3 4.6 4.1 4.7 6.3*  CL 100 99 104 100 96  CO2 23 27 28 29 31   GLUCOSE 79 124* 119* 132* 207*  BUN 43* 40* 33* 30* 30*  CREATININE 2.98* 2.06* 1.40* 1.13* 1.14*  CALCIUM 8.9 8.7 8.7 8.7 8.8    CBG:  Recent Labs Lab 08/25/13 1438 08/25/13 1609 08/25/13 1714 08/25/13 2208 08/26/13 0755  GLUCAP 119* 103* 106* 235* 196*    GFR Estimated Creatinine Clearance: 69 ml/min (by C-G formula based on Cr of 1.14).  Coagulation profile No results found for this basename: INR, PROTIME,  in the last 168 hours  Cardiac Enzymes No results found for this basename: CK, CKMB, TROPONINI, MYOGLOBIN,  in the last 168 hours  No components found with this basename: POCBNP,  No results found for this basename: DDIMER,  in the last 72 hours No results found for  this basename: HGBA1C,  in the last 72 hours No results found for this basename: CHOL, HDL, LDLCALC, TRIG, CHOLHDL, LDLDIRECT,  in the last 72 hours No results found for this basename: TSH, T4TOTAL, FREET3, T3FREE, THYROIDAB,  in the last 72 hours No results found for this basename: VITAMINB12, FOLATE, FERRITIN, TIBC, IRON, RETICCTPCT,  in the last 72 hours No results found for this basename: LIPASE, AMYLASE,  in the last 72 hours  Urine Studies No results found for this basename: UACOL, UAPR, USPG, UPH, UTP, UGL, UKET, UBIL, UHGB, UNIT, UROB, ULEU, UEPI, UWBC, URBC, UBAC, CAST, CRYS, UCOM, BILUA,  in the last 72 hours  MICROBIOLOGY: Recent Results (from the past 240 hour(s))  URINE CULTURE     Status: None   Collection Time    08/19/13 12:25 PM      Result Value Range Status   Specimen Description URINE, CATHETERIZED   Final   Special Requests PT ON CEFTRIAXONE VANCO   Final   Culture  Setup Time     Final   Value: 08/19/2013 17:57     Performed at 08/21/2013 Count     Final   Value: NO GROWTH     Performed at [facility]   Culture     Final   Value: NO GROWTH     Performed at [facility]   Report Status 08/20/2013 FINAL   Final  SURGICAL PCR SCREEN     Status: None   Collection Time    08/25/13  5:59 AM      Result Value Range Status   MRSA, PCR NEGATIVE  NEGATIVE Final   Staphylococcus aureus NEGATIVE  NEGATIVE Final   Comment:            The Xpert SA Assay (FDA     approved for NASAL specimens     in patients over 68 years of age),     is one component of     a comprehensive surveillance     program.  Test performance has  been validated by [facility] for patients greater     than or equal to 68 year old.     It is not intended     to diagnose infection nor to     guide or monitor treatment.  CULTURE, ROUTINE-ABSCESS     Status: None   Collection Time    08/25/13  4:00 PM      Result Value Range Status   Specimen  Description ABSCESS LEG LEFT   Final   Special Requests LEFT LATERAL THIGH PT ON VANCO ROCEPHIN   Final   Gram Stain     Final   Value: MODERATE WBC PRESENT,BOTH PMN AND MONONUCLEAR     NO SQUAMOUS EPITHELIAL CELLS SEEN     NO ORGANISMS SEEN     Performed at [facility]   Culture PENDING   Incomplete   Report Status PENDING   Incomplete  ANAEROBIC CULTURE     Status: None   Collection Time    08/25/13  4:00 PM      Result Value Range Status   Specimen Description ABSCESS LEG LEFT   Final   Special Requests LEFT LATERAL THIGH PT ON VANCO ROCEPHIN   Final   Gram Stain     Final   Value: MODERATE WBC PRESENT,BOTH PMN AND MONONUCLEAR     NO SQUAMOUS EPITHELIAL CELLS SEEN     NO ORGANISMS SEEN     Performed at [facility]   Culture     Final   Value: NO ANAEROBES ISOLATED; CULTURE IN PROGRESS FOR 5 DAYS     Performed at [facility]   Report Status PENDING   Incomplete    RADIOLOGY STUDIES/RESULTS: Ct Abdomen Pelvis Wo Contrast  08/16/2013   CLINICAL DATA:  Abdominal pain.  EXAM: CT ABDOMEN AND PELVIS WITHOUT CONTRAST  TECHNIQUE: Multidetector CT imaging of the abdomen and pelvis was performed following the standard protocol without intravenous contrast.  COMPARISON:  CT of the left femur performed 08/15/2013, and CT of the pelvis performed 01/23/2013  FINDINGS: The visualized lung bases are clear.  The liver and spleen are unremarkable in appearance. The gallbladder is within normal limits. The pancreas and left adrenal gland are unremarkable. A 1.2 cm right adrenal lesion is noted, too high in attenuation to characterize as an adrenal adenoma on this study.  Residual contrast is seen within the renal calyces. Nonspecific perinephric stranding is noted bilaterally. A small 1.0 cm hypodensity at the lower pole of the right kidney may reflect a small cyst. There is no evidence of hydronephrosis. No definite obstructing ureteral stones are seen.  No free fluid is  identified. The small bowel is unremarkable in appearance. The stomach is within normal limits. No acute vascular abnormalities are seen.  The appendix is normal in caliber and contains air, without evidence for appendicitis. The colon is unremarkable in appearance.  The bladder is largely filled with contrast. The previously noted debris and air in the bladder are displaced by contrast. The amount of debris seems decreased from the recent prior study; some of the debris may have been passed in the urine. Scattered tiny foci of air are again seen outside the bladder wall; the appearance raises suspicion for emphysematous cystitis. The uterus is unremarkable in appearance. The ovaries are relatively symmetric; no suspicious adnexal masses are seen. Enlarged left inguinal nodes are again seen.  No acute osseous abnormalities are identified.  IMPRESSION: 1. Previously noted debris and  air in the bladder are displaced by contrast. The amount of debris appears decreased from the recent prior study; some of the debris may have been passed in the urine. Scattered tiny foci of air again seen outside the bladder wall; the appearance raises suspicion for emphysematous cystitis. 2. Small right renal cyst. 3. 1.2 cm right adrenal lesion, too high in attenuation to characterize as an adrenal adenoma on this study. Adrenal protocol CT or MRI could be considered for further evaluation, when and as deemed clinically appropriate. 4. Enlarged left inguinal nodes again seen.   Electronically Signed   By: Roanna RaiderJeffery  Chang M.D.   On: 08/16/2013 03:51   Ct Head Wo Contrast  08/20/2013   CLINICAL DATA:  Altered mental status.  EXAM: CT HEAD WITHOUT CONTRAST  TECHNIQUE: Contiguous axial images were obtained from the base of the skull through the vertex without intravenous contrast.  COMPARISON:  10/06/2011 CT.  05/03/2011 MR.  FINDINGS: No intracranial hemorrhage.  No CT evidence of large acute infarct  No intracranial mass lesion noted  on this unenhanced exam.  Calcification adjacent to the superior aspect of the falx unchanged and probably an incidental finding rather than and a meningioma.  Mastoid air cells, middle ear cavities and visualized paranasal sinuses are clear. Very mild exophthalmos.  IMPRESSION: No acute abnormality.  Please see above.   Electronically Signed   By: Bridgett LarssonSteve  Olson M.D.   On: 08/20/2013 11:44   Ct Femur Left W Contrast  08/16/2013   CLINICAL DATA:  Left thigh swelling and erythema, with left lateral thigh pain just above the knee.  EXAM: CT OF THE LEFT FEMUR WITH CONTRAST  TECHNIQUE: Multidetector CT imaging of the left femur was performed according to the standard protocol following intravenous contrast administration. Multiplanar CT image reconstructions were also generated.  CONTRAST:  100mL OMNIPAQUE IOHEXOL 300 MG/ML  SOLN  COMPARISON:  Left knee radiographs performed 01/16/2006, and CT of the pelvis performed 01/23/2013  FINDINGS: There is diffuse soft tissue inflammation and edema along the lateral aspect of the left thigh, extending inferiorly below the left knee. Associated significant skin thickening is seen. Findings are most compatible with cellulitis. There is no evidence of underlying abscess at this time. There is minimal associated soft tissue edema underlying the fascia, though the underlying musculature is generally intact.  There is no evidence of vascular compromise. Very small knee joint effusion is noted. The knee joint is otherwise grossly unremarkable in appearance. Enlarged left inguinal nodes are seen, measuring up to 1.6 cm in short axis. Scattered prominent collateral vessels are noted along the visualized anterior abdominal wall.  The left hip joint is unremarkable in appearance. No hip joint effusion is identified. The visualized portions of the uterus are grossly unremarkable. Note is made of a large amount of debris and solid material filling the bladder, with trace air noted outside  of the bladder. This appears new from the CT of the pelvis performed 01/23/2013.  No acute osseous abnormalities are identified.  IMPRESSION: 1. Diffuse soft tissue inflammation and edema along the lateral aspect of the left thigh, extending inferiorly below the left knee, with associated skin thickening, compatible with cellulitis. No evidence of underlying abscess at this time. Minimal associated soft tissue edema seen underlying the fascia, though the underlying musculature is grossly intact. 2. There is small knee joint effusion noted. 3. Left inguinal lymphadenopathy seen, with nodes measuring up to 1.6 cm in short axis. 4. Prominent collateral vessels noted along the anterior  abdominal wall. 5. Large amount of debris and solid material filling the bladder, with trace air noted outside the bladder. This is new from the CT of the pelvis performed 01/23/2013. This is a very unusual appearance, and could conceivably reflect a colovesical fistula, or possibly severe emphysematous cystitis. Would correlate for associated symptoms.  These results were called by telephone at the time of interpretation on 08/16/2013 at 1:27 AM to Dr. Eber Hong , who verbally acknowledged these results.   Electronically Signed   By: Roanna Raider M.D.   On: 08/16/2013 01:29   US Renal  08/19/2013   CLINICAL DATA:  Increasing creatinine.  EXAM: RENAL/URINARY TRACT ULTRASOUND COMPLETE  COMPARISON:  CT abdomen and pelvis 08/16/2013  FINDINGS: Right Kidney:  Length: 13.1 cm. Normal parenchymal thickness with mild increased cortical echotexture. Changes suggest medical renal disease. No hydronephrosis or solid mass.  Left Kidney:  Length: 12.4 cm. Normal parenchymal thickness with mild increased cortical echotexture. Changes suggest medical renal disease. No hydronephrosis or solid mass.  Bladder:  Bladder is decompressed and not visualized.  IMPRESSION: The Mild increased parenchymal echotexture in both kidneys suggesting medical  renal disease. No evidence of hydronephrosis. No significant atrophy.   Electronically Signed   By: Burman Nieves M.D.   On: 08/19/2013 Delanna Ahmadi, MD  [facility] Pager:[phone]  If 7PM-7AM, please contact night-coverage [email or web address] Password [email or web address] 08/26/2013, 11:59 AM   LOS: 11 days

## 2013-08-26 NOTE — Progress Notes (Signed)
PT Cancellation Note  Patient Details Name: KIRSTEIN BAXLEY MRN: [id number] DOB: 1961/04/03   Cancelled Treatment:    Reason Eval/Treat Not Completed: Pain limiting ability to participate; attempted twice this am to see pt.  Had RN pre-medicate, then pt underwent dressing change and currently unable to tolerate mobilizing.  Does report she got up to chair yesterday evening after surgery.  Will try later this pm if time allows.   WYNN,CYNDI 08/26/2013, 10:37 AM

## 2013-08-27 ENCOUNTER — Encounter (HOSPITAL_COMMUNITY): Payer: Self-pay | Admitting: General Surgery

## 2013-08-27 LAB — BASIC METABOLIC PANEL
BUN: 34 mg/dL — ABNORMAL HIGH (ref 6–23)
BUN: 31 mg/dL — ABNORMAL HIGH (ref 6–23)
CALCIUM: 9.1 mg/dL (ref 8.4–10.5)
CO2: 34 mEq/L — ABNORMAL HIGH (ref 19–32)
CO2: 33 meq/L — AB (ref 19–32)
CREATININE: 1.28 mg/dL — AB (ref 0.50–1.10)
Calcium: 9 mg/dL (ref 8.4–10.5)
Chloride: 98 mEq/L (ref 96–112)
Chloride: 100 mEq/L (ref 96–112)
Creatinine, Ser: 1.2 mg/dL — ABNORMAL HIGH (ref 0.50–1.10)
GFR calc Af Amer: 55 mL/min — ABNORMAL LOW (ref >90)
GFR calc Af Amer: 59 mL/min — ABNORMAL LOW (ref >90)
GFR calc non Af Amer: 51 mL/min — ABNORMAL LOW (ref >90)
GFR, EST NON AFRICAN AMERICAN: 47 mL/min — AB (ref >90)
Glucose, Bld: 123 mg/dL — ABNORMAL HIGH (ref 70–99)
Glucose, Bld: 93 mg/dL (ref 70–99)
POTASSIUM: 4.9 meq/L (ref 3.7–5.3)
Potassium: 5.8 mEq/L — ABNORMAL HIGH (ref 3.7–5.3)
SODIUM: 142 meq/L (ref 137–147)
Sodium: 147 mEq/L (ref 137–147)

## 2013-08-27 LAB — CK: Total CK: 47 U/L (ref 7–177)

## 2013-08-27 LAB — GLUCOSE, CAPILLARY
GLUCOSE-CAPILLARY: 114 mg/dL — AB (ref 70–99)
Glucose-Capillary: 139 mg/dL — ABNORMAL HIGH (ref 70–99)
Glucose-Capillary: 115 mg/dL — ABNORMAL HIGH (ref 70–99)
Glucose-Capillary: 143 mg/dL — ABNORMAL HIGH (ref 70–99)
Glucose-Capillary: 118 mg/dL — ABNORMAL HIGH (ref 70–99)

## 2013-08-27 MED ORDER — DEXTROSE 50 % IV SOLN
1.0000 | Freq: Once | INTRAVENOUS | Status: AC
  Administered 2013-08-27: 50 mL via INTRAVENOUS
  Filled 2013-08-27: qty 50

## 2013-08-27 MED ORDER — SODIUM POLYSTYRENE SULFONATE 15 GM/60ML PO SUSP
45.0000 g | Freq: Once | ORAL | Status: AC
  Administered 2013-08-27: 45 g via ORAL
  Filled 2013-08-27: qty 180

## 2013-08-27 MED ORDER — INSULIN ASPART 100 UNIT/ML ~~LOC~~ SOLN
8.0000 [IU] | Freq: Once | SUBCUTANEOUS | Status: AC
  Administered 2013-08-27: 8 [IU] via INTRAVENOUS

## 2013-08-27 MED ORDER — OXYCODONE HCL 5 MG PO TABS
5.0000 mg | ORAL_TABLET | ORAL | Status: DC | PRN
  Administered 2013-08-27 – 2013-08-28 (×2): 10 mg via ORAL
  Filled 2013-08-27 (×2): qty 2

## 2013-08-27 MED ORDER — FUROSEMIDE 40 MG PO TABS
40.0000 mg | ORAL_TABLET | Freq: Every day | ORAL | Status: DC
  Administered 2013-08-28: 40 mg via ORAL
  Filled 2013-08-27 (×3): qty 1

## 2013-08-27 NOTE — Progress Notes (Signed)
I have seen and examined the pt and agree with NP-Riebock's progress note. Con't dressing changes IV abx x 24hrs and than can transition to PO

## 2013-08-27 NOTE — Anesthesia Postprocedure Evaluation (Signed)
  Anesthesia Post-op Note  Patient: [name]  Procedure(s) Performed: Procedure(s): IRRIGATION AND DEBRIDEMENT EXTREMITY (Left)  Patient Location: PACU and Nursing Unit  Anesthesia Type:MAC  Level of Consciousness: alert   Airway and Oxygen Therapy: Patient Spontanous Breathing  Post-op Pain: mild  Post-op Assessment: Post-op Vital signs reviewed, Patient's Cardiovascular Status Stable, Respiratory Function Stable, Patent Airway and No signs of Nausea or vomiting  Post-op Vital Signs: Reviewed and stable  Complications: No apparent anesthesia complications

## 2013-08-27 NOTE — Care Management Note (Signed)
   CARE MANAGEMENT NOTE 08/27/2013  Patient:  [name], [name]   Account Number:  [id number]  Date Initiated:  08/20/2013  Documentation initiated by:  Quinntin Malter  Subjective/Objective Assessment:   Sepsis due to cellulitis, I&D of lt leg on 08/26/2013, elevated K.     Action/Plan:   CM following for progression and d/c planning  08/27/13 Met with pt re d/c needs, pt has used [facility] previously and wishes to use that agency again for dressing changes.   Anticipated DC Date:  08/28/2013   Anticipated DC Plan:  [place]  CM consult      Choice offered to / List presented to:  C-1 Patient        [place] arranged  HH-1 RN      [place].   Status of service:  Completed, signed off Medicare Important Message given?   (If response is "NO", the following Medicare IM given date fields will be blank) Date Medicare IM given:   Date Additional Medicare IM given:    Discharge Disposition:  [place]  Per UR Regulation:    If discussed at Long Length of Stay Meetings, dates discussed:    Comments:  08/27/13 Met with pt and mother re d/c needs, pt has DME, explained to pt that [facility] will not come every day and that she and or family will need to learn to do the dressing change, she is in agreement. [place] notified of plan to d/c in am and need for dressing changes. Pt RN will begin to teach pt to do dressing change this pm. Jasmine Pang RN MPH, case manager, [phone]

## 2013-08-27 NOTE — Progress Notes (Signed)
Progress note dated 08/26/13.  The patient was seen at approximately 0930.  S: no complaints, given morphine approx 15 mins prior to dressing change. The patient stated she was able to do the dressing changes at home with patient teaching prior to discharge and home health  O: General-VSS.  NAD First dressing change completed, wound has mild erythema, drainage.  RN was told to pack with iodoform.  The patient was given morphine after the dressing was removed due to increased pain.    A/P Left lateral thigh cellulitis and abscess  S/p I&D 08/25/12 Dr. Derrell Lolling POD #1 start BID wet to dry dressing changes.   C/w antibiotics   [name], ANP-BC 08/27/13 12:59 PM

## 2013-08-27 NOTE — Progress Notes (Signed)
2 Days Post-Op  Subjective: No changes overnight.  Still asking for IV pain meds with dressing changes.  Objective: Vital signs in last 24 hours: Temp:  [97.8 F (36.6 C)-98.4 F (36.9 C)] 98.1 F (36.7 C) (01/07 0417) Pulse Rate:  [56-63] 61 (01/07 0417) Resp:  [16-18] 16 (01/07 0417) BP: (118-156)/(48-58) 156/58 mmHg (01/07 0417) SpO2:  [90 %-96 %] 90 % (01/07 0417) Last BM Date: 08/24/13  Intake/Output from previous day: 01/06 0701 - 01/07 0700 In: 600 [P.O.:600] Out: 500 [Urine:500] Intake/Output this shift:   PE Left lateral leg wound with erythema, some purulent drainage.  Lab Results:   Recent Labs  08/25/13 0722  WBC 7.2  HGB 8.3*  HCT 26.7*  PLT 393   BMET  Recent Labs  08/26/13 1522 08/27/13 0431  NA 145 142  K 5.3 5.8*  CL 100 98  CO2 33* 34*  GLUCOSE 110* 123*  BUN 32* 34*  CREATININE 1.18* 1.28*  CALCIUM 9.0 9.1   PT/INR No results found for this basename: LABPROT, INR,  in the last 72 hours ABG No results found for this basename: PHART, PCO2, PO2, HCO3,  in the last 72 hours  Studies/Results: No results found.  Anti-infectives: Anti-infectives   Start     Dose/Rate Route Frequency Ordered Stop   08/23/13 0800  vancomycin (VANCOCIN) IVPB 1000 mg/200 mL premix     1,000 mg 200 mL/hr over 60 Minutes Intravenous Every 24 hours 08/23/13 0646     08/22/13 0915  vancomycin (VANCOCIN) IVPB 1000 mg/200 mL premix     1,000 mg 200 mL/hr over 60 Minutes Intravenous  Once 08/22/13 0905 08/22/13 1102   08/20/13 1100  vancomycin (VANCOCIN) IVPB 750 mg/150 ml premix     750 mg 150 mL/hr over 60 Minutes Intravenous  Once 08/20/13 1059 08/20/13 1407   08/19/13 1800  vancomycin (VANCOCIN) 1,500 mg in sodium chloride 0.9 % 500 mL IVPB  Status:  Discontinued     1,500 mg 250 mL/hr over 120 Minutes Intravenous Every 48 hours 08/17/13 1800 08/17/13 1804   08/17/13 1900  vancomycin (VANCOCIN) 1,500 mg in sodium chloride 0.9 % 500 mL IVPB  Status:   Discontinued     1,500 mg 250 mL/hr over 120 Minutes Intravenous Every 48 hours 08/17/13 1804 08/18/13 1129   08/17/13 1830  vancomycin (VANCOCIN) 2,000 mg in sodium chloride 0.9 % 500 mL IVPB  Status:  Discontinued     2,000 mg 250 mL/hr over 120 Minutes Intravenous  Once 08/17/13 1759 08/17/13 1802   08/16/13 1200  vancomycin (VANCOCIN) IVPB 1000 mg/200 mL premix  Status:  Discontinued     1,000 mg 200 mL/hr over 60 Minutes Intravenous Every 12 hours 08/16/13 0344 08/16/13 0819   08/16/13 0900  cefTRIAXone (ROCEPHIN) 1 g in dextrose 5 % 50 mL IVPB     1 g 100 mL/hr over 30 Minutes Intravenous Every 24 hours 08/16/13 0819     08/16/13 0300  ertapenem (INVANZ) 1 g in sodium chloride 0.9 % 50 mL IVPB     1 g 100 mL/hr over 30 Minutes Intravenous  Once 08/16/13 0236 08/16/13 0349   08/15/13 2315  vancomycin (VANCOCIN) IVPB 1000 mg/200 mL premix     1,000 mg 200 mL/hr over 60 Minutes Intravenous  Once 08/15/13 2302 08/16/13 0316      Assessment/Plan: Left lateral thigh cellulitis and abscess S/p I&D 08/25/12 Dr. Derrell Lolling POD #2 BID wet to dry dressing changes, pre-medicate with oral pain meds.  Will need home health to aide with dressing changes Continue with antibiotics    LOS: 12 days    Bonner Puna [facility] ANP-BC Pager [phone] 08/27/2013 10:40 AM

## 2013-08-27 NOTE — Progress Notes (Addendum)
PATIENT DETAILS Name: [name] Age: 53 y.o. Sex: female Date of Birth: 05/30/1961 Admit Date: 08/15/2013 Admitting Physician Ron Parker, MD [id number], DAVID C, MD  Brief summary Patient is a 53 year old female with a past medical history of type 2 diabetes, right BKA, chronic diastolic heart failure, hypertension who was admitted on 08/16/13 with left thigh swelling and redness that started after she sustained a fall approximately 9 days prior to this admission. She was evaluated in the emergency room, thought to have cellulitis associated with acute renal failure. She was started on vancomycin and Rocephin, and subsequently admitted to the hospitalist service. A CT scan of the left thigh on admission showed cellulitis without any abscess, however it also showed questionable emphysematous cystitis. A subsequent CT scan of the abdomen did confirm emphysematous cystitis. Blood cultures drawn on 12/27 continued to be negative, urine culture drawn on 12/30 continues to be negative. Hospital course has been prolonged, and complicated by renal failure, hyperkalemia. After starting antibiotics, cellulitis improved significantly, however on the left thigh started becoming fluctuant. General surgery was then consulted, who subsequently took the patient to the or for incision and drainage. The latter half of her hospitalization has been complicated by development of hyperkalemia of unknown etiology. However she continues to improve, and plans out possible discharge in 1-2 days  Subjective: Continues to have pain at the incision and drainage site. Requiring IV medications.  Assessment/Plan: Sepsis secondary to Cellulitis of left leg - Sepsis has resolved with IV antibiotics and IV fluids - Blood culture on 08/16/13 negative - Urine culture on 08/19/13 negative - Intraoperative wound cultures on 08/25/13-negative so far  Left thigh cellulitis with abscess formation - Continue with  empiric antibiotics-vancomycin and ceftriaxone day 12 -  general surgery consulted-to the OR for incision and drainage 1/5 - Intraoperative wound care cultures negative so far - Postop care per general surgery  Emphysematous Cystitis  - Urine culture obtained when patient was already on antibiotics, therefore urine culture negative. - Continue with IV Rocephin- plan for a total of 14 days of antibiotics - Urology was consulted- plans are to continue with antibiotics for a total of 2 weeks. Foley catheter has been discontinued.  Acute renal failure - Multifactorial-dehydration, contrast, sepsis and possibly to obstruction - Creatinine significantly better, repeat electrolytes periodically - Nephrology consulted, and has signed off   Acute hypoxic respiratory failure - Suspected secondary to mild CHF decompensation/COPD - Required oxygen- now off oxygen, continue nebs, change intravenous Lasix to oral dosing - Incentive spirometry, taper off oxygen  Acute diastolic heart failure with mild decompensation - Initially started on IV Lasix, now transitioned to oral Lasix, electrolytes will be checked periodically  Hyperkalemia -? Etiology - Noted on 1/6 and 1/7 -treated with IV insulin/D50/Kayexalate on both occasions. Beta blocker discontinued. CK within normal limits. We'll recheck electrolytes this evening, and subsequently in a.m.  Hypertension - Fluctuating, but with moderate control c/w amlodipine Lasix  Diabetes - CBGs stable - Continue with Levemir and NovoLog  Anemia - Suspect secondary to acute illness/dilutional, no evidence of overt loss - Monitor hemoglobin and hematocrit  Hyperkalemia/hyponatremia - Resolved  Hypothyroidism - Continue Synthroid  Disposition: Remain inpatient- home 1/8  DVT Prophylaxis: Prophylactic Heparin   Code Status: Full code   Family Communication Mother at bedside  Procedures:  None  CONSULTS:  nephrology, general surgery and  urology   MEDICATIONS: Scheduled Meds: . amLODipine  10 mg Oral Daily  . aspirin EC  81 mg  Oral Daily  . atorvastatin  80 mg Oral Daily  . budesonide-formoterol  2 puff Inhalation BID  . cefTRIAXone (ROCEPHIN)  IV  1 g Intravenous Q24H  . docusate sodium  100 mg Oral BID  . feeding supplement (GLUCERNA SHAKE)  237 mL Oral Daily  . ferrous sulfate  325 mg Oral TID WC  . FLUoxetine  20 mg Oral BID  . furosemide  40 mg Oral Daily  . gabapentin  100 mg Oral BID  . heparin  5,000 Units Subcutaneous Q8H  . insulin aspart  0-15 Units Subcutaneous TID WC  . insulin aspart  0-5 Units Subcutaneous QHS  . insulin aspart  20 Units Subcutaneous TID WC  . insulin detemir  140 Units Subcutaneous Daily  . ipratropium-albuterol  3 mL Nebulization TID  . levothyroxine  25 mcg Oral QAC breakfast  . metoCLOPramide  5 mg Oral TID  . multivitamin with minerals  1 tablet Oral Daily  . oxybutynin  5 mg Oral QAC breakfast  . pantoprazole  40 mg Oral Daily  . tiotropium  18 mcg Inhalation Daily  . vancomycin  1,000 mg Intravenous Q24H   Continuous Infusions:  PRN Meds:.acetaminophen, acetaminophen, fentaNYL, haloperidol lactate, hydrALAZINE, morphine injection, ondansetron (ZOFRAN) IV, ondansetron, oxyCODONE, polyethylene glycol  Antibiotics: Anti-infectives   Start     Dose/Rate Route Frequency Ordered Stop   08/23/13 0800  vancomycin (VANCOCIN) IVPB 1000 mg/200 mL premix     1,000 mg 200 mL/hr over 60 Minutes Intravenous Every 24 hours 08/23/13 0646     08/22/13 0915  vancomycin (VANCOCIN) IVPB 1000 mg/200 mL premix     1,000 mg 200 mL/hr over 60 Minutes Intravenous  Once 08/22/13 0905 08/22/13 1102   08/20/13 1100  vancomycin (VANCOCIN) IVPB 750 mg/150 ml premix     750 mg 150 mL/hr over 60 Minutes Intravenous  Once 08/20/13 1059 08/20/13 1407   08/19/13 1800  vancomycin (VANCOCIN) 1,500 mg in sodium chloride 0.9 % 500 mL IVPB  Status:  Discontinued     1,500 mg 250 mL/hr over 120 Minutes  Intravenous Every 48 hours 08/17/13 1800 08/17/13 1804   08/17/13 1900  vancomycin (VANCOCIN) 1,500 mg in sodium chloride 0.9 % 500 mL IVPB  Status:  Discontinued     1,500 mg 250 mL/hr over 120 Minutes Intravenous Every 48 hours 08/17/13 1804 08/18/13 1129   08/17/13 1830  vancomycin (VANCOCIN) 2,000 mg in sodium chloride 0.9 % 500 mL IVPB  Status:  Discontinued     2,000 mg 250 mL/hr over 120 Minutes Intravenous  Once 08/17/13 1759 08/17/13 1802   08/16/13 1200  vancomycin (VANCOCIN) IVPB 1000 mg/200 mL premix  Status:  Discontinued     1,000 mg 200 mL/hr over 60 Minutes Intravenous Every 12 hours 08/16/13 0344 08/16/13 0819   08/16/13 0900  cefTRIAXone (ROCEPHIN) 1 g in dextrose 5 % 50 mL IVPB     1 g 100 mL/hr over 30 Minutes Intravenous Every 24 hours 08/16/13 0819     08/16/13 0300  ertapenem (INVANZ) 1 g in sodium chloride 0.9 % 50 mL IVPB     1 g 100 mL/hr over 30 Minutes Intravenous  Once 08/16/13 0236 08/16/13 0349   08/15/13 2315  vancomycin (VANCOCIN) IVPB 1000 mg/200 mL premix     1,000 mg 200 mL/hr over 60 Minutes Intravenous  Once 08/15/13 2302 08/16/13 0316       PHYSICAL EXAM: Vital signs in last 24 hours: Filed Vitals:   08/26/13 1653 08/26/13  2133 08/27/13 0417 08/27/13 1000  BP: 136/56 153/57 156/58 171/62  Pulse: 63 62 61 57  Temp: 98.4 F (36.9 C) 97.8 F (36.6 C) 98.1 F (36.7 C) 98.2 F (36.8 C)  TempSrc: Oral Oral Oral Oral  Resp: 16 16 16 18   Height:      Weight:      SpO2: 91% 94% 90% 96%    Weight change:  Filed Weights   08/23/13 2014 08/25/13 0520 08/25/13 2209  Weight: 104.101 kg (229 lb 8 oz) 105.325 kg (232 lb 3.2 oz) 103.783 kg (228 lb 12.8 oz)   Body mass index is 38.07 kg/(m^2).   Gen Exam: Awake and alert with clear speech.   Neck: Supple, No JVD.  Chest: B/L Clear.   CVS: S1 S2 Regular, no murmurs.  Abdomen: soft, BS +, non tender, non distended.  Extremities: Left thigh in bandage-did not open Neurologic: Non Focal.   Skin:  No Rash.   Wounds: N/A.    Intake/Output from previous day:  Intake/Output Summary (Last 24 hours) at 08/27/13 1210 Last data filed at 08/27/13 0900  Gross per 24 hour  Intake    600 ml  Output    600 ml  Net      0 ml     LAB RESULTS: CBC  Recent Labs Lab 08/21/13 0455 08/22/13 0500 08/23/13 0525 08/24/13 0500 08/25/13 0722  WBC 11.3* 7.8 7.5 7.0 7.2  HGB 8.2* 7.6* 8.3* 8.3* 8.3*  HCT 24.8* 22.6* 25.4* 25.5* 26.7*  PLT 306 305 359 362 393  MCV 84.6 84.3 85.2 86.1 88.4  MCH 28.0 28.4 27.9 28.0 27.5  MCHC 33.1 33.6 32.7 32.5 31.1  RDW 14.3 14.2 14.2 14.3 14.8    Chemistries   Recent Labs Lab 08/23/13 0525 08/25/13 0722 08/26/13 0430 08/26/13 1522 08/27/13 0431  NA 144 142 138 145 142  K 4.1 4.7 6.3* 5.3 5.8*  CL 104 100 96 100 98  CO2 28 29 31  33* 34*  GLUCOSE 119* 132* 207* 110* 123*  BUN 33* 30* 30* 32* 34*  CREATININE 1.40* 1.13* 1.14* 1.18* 1.28*  CALCIUM 8.7 8.7 8.8 9.0 9.1    CBG:  Recent Labs Lab 08/26/13 1159 08/26/13 1638 08/26/13 2135 08/27/13 0800 08/27/13 1135  GLUCAP 183* 101* 139* 115* 143*    GFR Estimated Creatinine Clearance: 61.4 ml/min (by C-G formula based on Cr of 1.28).  Coagulation profile No results found for this basename: INR, PROTIME,  in the last 168 hours  Cardiac Enzymes No results found for this basename: CK, CKMB, TROPONINI, MYOGLOBIN,  in the last 168 hours  No components found with this basename: POCBNP,  No results found for this basename: DDIMER,  in the last 72 hours No results found for this basename: HGBA1C,  in the last 72 hours No results found for this basename: CHOL, HDL, LDLCALC, TRIG, CHOLHDL, LDLDIRECT,  in the last 72 hours No results found for this basename: TSH, T4TOTAL, FREET3, T3FREE, THYROIDAB,  in the last 72 hours No results found for this basename: VITAMINB12, FOLATE, FERRITIN, TIBC, IRON, RETICCTPCT,  in the last 72 hours No results found for this basename: LIPASE, AMYLASE,  in the  last 72 hours  Urine Studies No results found for this basename: UACOL, UAPR, USPG, UPH, UTP, UGL, UKET, UBIL, UHGB, UNIT, UROB, ULEU, UEPI, UWBC, URBC, UBAC, CAST, CRYS, UCOM, BILUA,  in the last 72 hours  MICROBIOLOGY: Recent Results (from the past 240 hour(s))  URINE CULTURE  Status: None   Collection Time    08/19/13 12:25 PM      Result Value Range Status   Specimen Description URINE, CATHETERIZED   Final   Special Requests PT ON CEFTRIAXONE VANCO   Final   Culture  Setup Time     Final   Value: 08/19/2013 17:57     Performed at [facility] Count     Final   Value: NO GROWTH     Performed at [facility]   Culture     Final   Value: NO GROWTH     Performed at [facility]   Report Status 08/20/2013 FINAL   Final  SURGICAL PCR SCREEN     Status: None   Collection Time    08/25/13  5:59 AM      Result Value Range Status   MRSA, PCR NEGATIVE  NEGATIVE Final   Staphylococcus aureus NEGATIVE  NEGATIVE Final   Comment:            The Xpert SA Assay (FDA     approved for NASAL specimens     in patients over 69 years of age),     is one component of     a comprehensive surveillance     program.  Test performance has     been validated by [facility] [employer] for patients greater     than or equal to 65 year old.     It is not intended     to diagnose infection nor to     guide or monitor treatment.  CULTURE, ROUTINE-ABSCESS     Status: None   Collection Time    08/25/13  4:00 PM      Result Value Range Status   Specimen Description ABSCESS LEG LEFT   Final   Special Requests LEFT LATERAL THIGH PT ON VANCO ROCEPHIN   Final   Gram Stain     Final   Value: MODERATE WBC PRESENT,BOTH PMN AND MONONUCLEAR     NO SQUAMOUS EPITHELIAL CELLS SEEN     NO ORGANISMS SEEN     Performed at [facility]   Culture     Final   Value: Culture reincubated for better growth     Performed at [facility]   Report Status PENDING    Incomplete  ANAEROBIC CULTURE     Status: None   Collection Time    08/25/13  4:00 PM      Result Value Range Status   Specimen Description ABSCESS LEG LEFT   Final   Special Requests LEFT LATERAL THIGH PT ON VANCO ROCEPHIN   Final   Gram Stain     Final   Value: MODERATE WBC PRESENT,BOTH PMN AND MONONUCLEAR     NO SQUAMOUS EPITHELIAL CELLS SEEN     NO ORGANISMS SEEN     Performed at [facility]   Culture     Final   Value: NO ANAEROBES ISOLATED; CULTURE IN PROGRESS FOR 5 DAYS     Performed at [facility]   Report Status PENDING   Incomplete    RADIOLOGY STUDIES/RESULTS: Ct Abdomen Pelvis Wo Contrast  08/16/2013   CLINICAL DATA:  Abdominal pain.  EXAM: CT ABDOMEN AND PELVIS WITHOUT CONTRAST  TECHNIQUE: Multidetector CT imaging of the abdomen and pelvis was performed following the standard protocol without intravenous contrast.  COMPARISON:  CT of the left femur performed 08/15/2013, and  CT of the pelvis performed 01/23/2013  FINDINGS: The visualized lung bases are clear.  The liver and spleen are unremarkable in appearance. The gallbladder is within normal limits. The pancreas and left adrenal gland are unremarkable. A 1.2 cm right adrenal lesion is noted, too high in attenuation to characterize as an adrenal adenoma on this study.  Residual contrast is seen within the renal calyces. Nonspecific perinephric stranding is noted bilaterally. A small 1.0 cm hypodensity at the lower pole of the right kidney may reflect a small cyst. There is no evidence of hydronephrosis. No definite obstructing ureteral stones are seen.  No free fluid is identified. The small bowel is unremarkable in appearance. The stomach is within normal limits. No acute vascular abnormalities are seen.  The appendix is normal in caliber and contains air, without evidence for appendicitis. The colon is unremarkable in appearance.  The bladder is largely filled with contrast. The previously noted debris and air  in the bladder are displaced by contrast. The amount of debris seems decreased from the recent prior study; some of the debris may have been passed in the urine. Scattered tiny foci of air are again seen outside the bladder wall; the appearance raises suspicion for emphysematous cystitis. The uterus is unremarkable in appearance. The ovaries are relatively symmetric; no suspicious adnexal masses are seen. Enlarged left inguinal nodes are again seen.  No acute osseous abnormalities are identified.  IMPRESSION: 1. Previously noted debris and air in the bladder are displaced by contrast. The amount of debris appears decreased from the recent prior study; some of the debris may have been passed in the urine. Scattered tiny foci of air again seen outside the bladder wall; the appearance raises suspicion for emphysematous cystitis. 2. Small right renal cyst. 3. 1.2 cm right adrenal lesion, too high in attenuation to characterize as an adrenal adenoma on this study. Adrenal protocol CT or MRI could be considered for further evaluation, when and as deemed clinically appropriate. 4. Enlarged left inguinal nodes again seen.   Electronically Signed   By: Roanna RaiderJeffery  Chang M.D.   On: 08/16/2013 03:51   Ct Head Wo Contrast  08/20/2013   CLINICAL DATA:  Altered mental status.  EXAM: CT HEAD WITHOUT CONTRAST  TECHNIQUE: Contiguous axial images were obtained from the base of the skull through the vertex without intravenous contrast.  COMPARISON:  10/06/2011 CT.  05/03/2011 MR.  FINDINGS: No intracranial hemorrhage.  No CT evidence of large acute infarct  No intracranial mass lesion noted on this unenhanced exam.  Calcification adjacent to the superior aspect of the falx unchanged and probably an incidental finding rather than and a meningioma.  Mastoid air cells, middle ear cavities and visualized paranasal sinuses are clear. Very mild exophthalmos.  IMPRESSION: No acute abnormality.  Please see above.   Electronically Signed    By: Bridgett LarssonSteve  Olson M.D.   On: 08/20/2013 11:44   Ct Femur Left W Contrast  08/16/2013   CLINICAL DATA:  Left thigh swelling and erythema, with left lateral thigh pain just above the knee.  EXAM: CT OF THE LEFT FEMUR WITH CONTRAST  TECHNIQUE: Multidetector CT imaging of the left femur was performed according to the standard protocol following intravenous contrast administration. Multiplanar CT image reconstructions were also generated.  CONTRAST:  100mL OMNIPAQUE IOHEXOL 300 MG/ML  SOLN  COMPARISON:  Left knee radiographs performed 01/16/2006, and CT of the pelvis performed 01/23/2013  FINDINGS: There is diffuse soft tissue inflammation and edema along the lateral aspect  of the left thigh, extending inferiorly below the left knee. Associated significant skin thickening is seen. Findings are most compatible with cellulitis. There is no evidence of underlying abscess at this time. There is minimal associated soft tissue edema underlying the fascia, though the underlying musculature is generally intact.  There is no evidence of vascular compromise. Very small knee joint effusion is noted. The knee joint is otherwise grossly unremarkable in appearance. Enlarged left inguinal nodes are seen, measuring up to 1.6 cm in short axis. Scattered prominent collateral vessels are noted along the visualized anterior abdominal wall.  The left hip joint is unremarkable in appearance. No hip joint effusion is identified. The visualized portions of the uterus are grossly unremarkable. Note is made of a large amount of debris and solid material filling the bladder, with trace air noted outside of the bladder. This appears new from the CT of the pelvis performed 01/23/2013.  No acute osseous abnormalities are identified.  IMPRESSION: 1. Diffuse soft tissue inflammation and edema along the lateral aspect of the left thigh, extending inferiorly below the left knee, with associated skin thickening, compatible with cellulitis. No evidence  of underlying abscess at this time. Minimal associated soft tissue edema seen underlying the fascia, though the underlying musculature is grossly intact. 2. There is small knee joint effusion noted. 3. Left inguinal lymphadenopathy seen, with nodes measuring up to 1.6 cm in short axis. 4. Prominent collateral vessels noted along the anterior abdominal wall. 5. Large amount of debris and solid material filling the bladder, with trace air noted outside the bladder. This is new from the CT of the pelvis performed 01/23/2013. This is a very unusual appearance, and could conceivably reflect a colovesical fistula, or possibly severe emphysematous cystitis. Would correlate for associated symptoms.  These results were called by telephone at the time of interpretation on 08/16/2013 at 1:27 AM to Dr. Eber Hong , who verbally acknowledged these results.   Electronically Signed   By: Roanna Raider M.D.   On: 08/16/2013 01:29   US Renal  08/19/2013   CLINICAL DATA:  Increasing creatinine.  EXAM: RENAL/URINARY TRACT ULTRASOUND COMPLETE  COMPARISON:  CT abdomen and pelvis 08/16/2013  FINDINGS: Right Kidney:  Length: 13.1 cm. Normal parenchymal thickness with mild increased cortical echotexture. Changes suggest medical renal disease. No hydronephrosis or solid mass.  Left Kidney:  Length: 12.4 cm. Normal parenchymal thickness with mild increased cortical echotexture. Changes suggest medical renal disease. No hydronephrosis or solid mass.  Bladder:  Bladder is decompressed and not visualized.  IMPRESSION: The Mild increased parenchymal echotexture in both kidneys suggesting medical renal disease. No evidence of hydronephrosis. No significant atrophy.   Electronically Signed   By: Burman Nieves M.D.   On: 08/19/2013 Delanna Ahmadi, MD  [facility] Pager:[phone]  If 7PM-7AM, please contact night-coverage [email or web address] Password [email or web address] 08/27/2013, 12:10 PM   LOS: 12 days

## 2013-08-27 NOTE — Progress Notes (Signed)
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 Has been on ABX since 12/27, new UCX obtained (12/30) and negative. NO h/o inflammatory bowel disease, diverticulosis/diverticulitis, or pelvic radiation. No baseline voiding complaints. Foley placed 12/30 for maximal drainage and decrease bladder wall tension while healing.F/u UCX negative and foley DC'd 1/6 after leg debridment. Now voiding w/o complaints.   2 - Acute Renal Failure - Pt with acute rise in Cr to 5.6 range from baseline 1.5 during admission. CT w/o hydro / stones / or other s/s obstruction. She has received IV contrast, IV ABX including Vancomycin, and suspect SIRS. Peak Cr >5 and now trending back down and stable near baseline again.  Today [name] is seen in f/u above. Remains afebrile. Her mental status has improved dramatically following treatment for her impressive infection. Family at bedside, very pleasant. No pnuematuria or fecaluria.  Objective: Vital signs in last 24 hours: Temp:  [97.8 F (36.6 C)-98.4 F (36.9 C)] 98.1 F (36.7 C) (01/07 0417) Pulse Rate:  [55-63] 61 (01/07 0417) Resp:  [16-18] 16 (01/07 0417) BP: (118-156)/(48-58) 156/58 mmHg (01/07 0417) SpO2:  [90 %-96 %] 90 % (01/07 0417) Last BM Date: 08/24/13  Intake/Output from previous day: 01/06 0701 - 01/07 0700 In: 600 [P.O.:600] Out: 500 [Urine:500] Intake/Output this shift:    General appearance: alert, cooperative, appears stated age and family at bedside Head: Normocephalic, without obvious abnormality, atraumatic Eyes: conjunctivae/corneas clear. PERRL, EOM's intact. Fundi benign. Ears: normal TM's and external ear canals both ears Nose: Nares normal. Septum midline. Mucosa normal. No drainage or sinus tenderness. Throat: lips, mucosa, and tongue normal; teeth and  gums normal Neck: no adenopathy, no carotid bruit, no JVD, supple, symmetrical, trachea midline and thyroid not enlarged, symmetric, no tenderness/mass/nodules Back: symmetric, no curvature. ROM normal. No CVA tenderness. Resp: clear to auscultation bilaterally Chest wall: no tenderness Cardio: regular rate and rhythm, S1, S2 normal, no murmur, click, rub or gallop GI: soft, non-tender; bowel sounds normal; no masses,  no organomegaly Pelvic: external genitalia normal and vagina normal without discharge Extremities: LLE with dressing in place, dramatically decreased skin erythema. RLE BKA. Pulses: 2+ and symmetric Skin: Skin color, texture, turgor normal. No rashes or lesions Lymph nodes: Cervical, supraclavicular, and axillary nodes normal. Neurologic: Grossly normal  Lab Results:   Recent Labs  08/25/13 0722  WBC 7.2  HGB 8.3*  HCT 26.7*  PLT 393   BMET  Recent Labs  08/26/13 0430 08/26/13 1522  NA 138 145  K 6.3* 5.3  CL 96 100  CO2 31 33*  GLUCOSE 207* 110*  BUN 30* 32*  CREATININE 1.14* 1.18*  CALCIUM 8.8 9.0   PT/INR No results found for this basename: LABPROT, INR,  in the last 72 hours ABG No results found for this basename: PHART, PCO2, PO2, HCO3,  in the last 72 hours  Studies/Results: No results found.  Anti-infectives: Anti-infectives   Start     Dose/Rate Route Frequency Ordered Stop   08/23/13 0800  vancomycin (VANCOCIN) IVPB 1000 mg/200 mL premix     1,000 mg 200 mL/hr over 60 Minutes Intravenous Every 24 hours 08/23/13 0646     08/22/13 0915  vancomycin (VANCOCIN) IVPB 1000 mg/200 mL premix     1,000 mg 200 mL/hr over 60 Minutes Intravenous  Once 08/22/13 0905 08/22/13 1102   08/20/13  1100  vancomycin (VANCOCIN) IVPB 750 mg/150 ml premix     750 mg 150 mL/hr over 60 Minutes Intravenous  Once 08/20/13 1059 08/20/13 1407   08/19/13 1800  vancomycin (VANCOCIN) 1,500 mg in sodium chloride 0.9 % 500 mL IVPB  Status:  Discontinued     1,500  mg 250 mL/hr over 120 Minutes Intravenous Every 48 hours 08/17/13 1800 08/17/13 1804   08/17/13 1900  vancomycin (VANCOCIN) 1,500 mg in sodium chloride 0.9 % 500 mL IVPB  Status:  Discontinued     1,500 mg 250 mL/hr over 120 Minutes Intravenous Every 48 hours 08/17/13 1804 08/18/13 1129   08/17/13 1830  vancomycin (VANCOCIN) 2,000 mg in sodium chloride 0.9 % 500 mL IVPB  Status:  Discontinued     2,000 mg 250 mL/hr over 120 Minutes Intravenous  Once 08/17/13 1759 08/17/13 1802   08/16/13 1200  vancomycin (VANCOCIN) IVPB 1000 mg/200 mL premix  Status:  Discontinued     1,000 mg 200 mL/hr over 60 Minutes Intravenous Every 12 hours 08/16/13 0344 08/16/13 0819   08/16/13 0900  cefTRIAXone (ROCEPHIN) 1 g in dextrose 5 % 50 mL IVPB     1 g 100 mL/hr over 30 Minutes Intravenous Every 24 hours 08/16/13 0819     08/16/13 0300  ertapenem (INVANZ) 1 g in sodium chloride 0.9 % 50 mL IVPB     1 g 100 mL/hr over 30 Minutes Intravenous  Once 08/16/13 0236 08/16/13 0349   08/15/13 2315  vancomycin (VANCOCIN) IVPB 1000 mg/200 mL premix     1,000 mg 200 mL/hr over 60 Minutes Intravenous  Once 08/15/13 2302 08/16/13 0316      Assessment/Plan:  1 - Emphysematous Cystitis - impressive case, likely due to underlying hyperglysemia / glycosuria. Rec continue catheter until systemic infectious parameters and need for close UOP monitoring subsided, as well as at least 2 weeks CX-specifc ABX. Should current UCX remain negative, would then likely continue cephalosporin or similar or PO equivalent for course. I do not favor any sort of GI-GU fistula given gas pattern and lack of risk-factors for such. She is doing well now s/p foley removal.   2 - Acute Renal Failure - Low suspicion for obstructive etiology given lack of hydro. Suspect acute medial renal stress from contrast, hypotension, ABX as likely cause.   3 - I will arrange GU follow up in about 1 month as outpatient. Clear to DC home from GU perspective.       [facility], Ibraheem Voris 08/27/2013

## 2013-08-28 LAB — CULTURE, ROUTINE-ABSCESS

## 2013-08-28 LAB — BASIC METABOLIC PANEL
BUN: 26 mg/dL — ABNORMAL HIGH (ref 6–23)
CHLORIDE: 96 meq/L (ref 96–112)
CO2: 31 mEq/L (ref 19–32)
Calcium: 9 mg/dL (ref 8.4–10.5)
Creatinine, Ser: 1.15 mg/dL — ABNORMAL HIGH (ref 0.50–1.10)
GFR calc Af Amer: 62 mL/min — ABNORMAL LOW (ref >90)
GFR calc non Af Amer: 54 mL/min — ABNORMAL LOW (ref >90)
GLUCOSE: 81 mg/dL (ref 70–99)
POTASSIUM: 4.9 meq/L (ref 3.7–5.3)
Sodium: 142 mEq/L (ref 137–147)

## 2013-08-28 LAB — GLUCOSE, CAPILLARY
GLUCOSE-CAPILLARY: 210 mg/dL — AB (ref 70–99)
Glucose-Capillary: 95 mg/dL (ref 70–99)

## 2013-08-28 MED ORDER — GLUCERNA SHAKE PO LIQD: 237.0000 mL | Freq: Every day | ORAL | Status: DC

## 2013-08-28 MED ORDER — FUROSEMIDE 40 MG PO TABS: 40.0000 mg | ORAL_TABLET | Freq: Every day | ORAL | Status: DC

## 2013-08-28 MED ORDER — OXYCODONE HCL 5 MG PO TABS: 5.0000 mg | ORAL_TABLET | ORAL | Status: DC | PRN

## 2013-08-28 MED ORDER — CIPROFLOXACIN HCL 500 MG PO TABS: 500.0000 mg | ORAL_TABLET | Freq: Two times a day (BID) | ORAL | Status: DC

## 2013-08-28 MED ORDER — AMLODIPINE BESYLATE 10 MG PO TABS: 10.0000 mg | ORAL_TABLET | Freq: Every day | ORAL | Status: DC

## 2013-08-28 MED ORDER — DOXYCYCLINE HYCLATE 50 MG PO CAPS: 50.0000 mg | ORAL_CAPSULE | Freq: Two times a day (BID) | ORAL | Status: DC

## 2013-08-28 MED ORDER — GABAPENTIN 100 MG PO CAPS: 100.0000 mg | ORAL_CAPSULE | Freq: Two times a day (BID) | ORAL | Status: DC

## 2013-08-28 NOTE — Discharge Instructions (Signed)

## 2013-08-28 NOTE — Care Management Note (Signed)
   CARE MANAGEMENT NOTE 08/28/2013  Patient:  [name], [name]   Account Number:  [id number]  Date Initiated:  08/20/2013  Documentation initiated by:  Khasir Woodrome  Subjective/Objective Assessment:   Sepsis due to cellulitis, I&D of lt leg on 08/26/2013, elevated K.     Action/Plan:   CM following for progression and d/c planning  08/27/13 Met with pt re d/c needs, pt has used [facility] previously and wishes to use that agency again for dressing changes.   Anticipated DC Date:  08/28/2013   Anticipated DC Plan:  [place]  CM consult      Choice offered to / List presented to:  C-1 Patient        [place] arranged  HH-1 RN      [place].   Status of service:  Completed, signed off Medicare Important Message given?   (If response is "NO", the following Medicare IM given date fields will be blank) Date Medicare IM given:   Date Additional Medicare IM given:    Discharge Disposition:  [place]  Per UR Regulation:    If discussed at Long Length of Stay Meetings, dates discussed:    Comments:  08/28/2013 Noted order for HHPT, however pt insurance will not cover this. Jasmine Pang RN MPH, case manager, [phone]  08/27/13 Met with pt and mother re d/c needs, pt has DME, explained to pt that [facility] will not come every day and that she and or family will need to learn to do the dressing change, she is in agreement. [place] notified of plan to d/c in am and need for dressing changes. Pt RN will begin to teach pt to do dressing change this pm. Jasmine Pang RN MPH, case manager, [phone]

## 2013-08-28 NOTE — Progress Notes (Signed)
3 Days Post-Op  Subjective: Pt having a bad day, frustrated.  Pain is okay.  Refuses to go to snf.   Objective: Vital signs in last 24 hours: Temp:  [97.8 F (36.6 C)-98.6 F (37 C)] 98.1 F (36.7 C) (01/08 0500) Pulse Rate:  [55-64] 63 (01/08 0500) Resp:  [16-18] 18 (01/08 0500) BP: (135-156)/(50-68) 156/68 mmHg (01/08 0500) SpO2:  [92 %-99 %] 94 % (01/08 0931) Weight:  [228 lb 9.6 oz (103.692 kg)] 228 lb 9.6 oz (103.692 kg) (01/07 2100) Last BM Date: 08/27/13  Intake/Output from previous day: 01/07 0701 - 01/08 0700 In: 1090 [P.O.:840; IV Piggyback:250] Out: 900 [Urine:900] Intake/Output this shift: Total I/O In: 240 [P.O.:240] Out: 650 [Urine:650]  Wound: c/d/i, mild erythema.  Will further assess after she has been given pain medication.  Lab Results:  No results found for this basename: WBC, HGB, HCT, PLT,  in the last 72 hours BMET  Recent Labs  08/27/13 1530 08/28/13 0554  NA 147 142  K 4.9 4.9  CL 100 96  CO2 33* 31  GLUCOSE 93 81  BUN 31* 26*  CREATININE 1.20* 1.15*  CALCIUM 9.0 9.0   PT/INR No results found for this basename: LABPROT, INR,  in the last 72 hours ABG No results found for this basename: PHART, PCO2, PO2, HCO3,  in the last 72 hours  Studies/Results: No results found.  Anti-infectives: Anti-infectives   Start     Dose/Rate Route Frequency Ordered Stop   08/28/13 0000  ciprofloxacin (CIPRO) 500 MG tablet     500 mg Oral 2 times daily 08/28/13 1026     08/28/13 0000  doxycycline (VIBRAMYCIN) 50 MG capsule     50 mg Oral 2 times daily 08/28/13 1026     08/23/13 0800  vancomycin (VANCOCIN) IVPB 1000 mg/200 mL premix     1,000 mg 200 mL/hr over 60 Minutes Intravenous Every 24 hours 08/23/13 0646     08/22/13 0915  vancomycin (VANCOCIN) IVPB 1000 mg/200 mL premix     1,000 mg 200 mL/hr over 60 Minutes Intravenous  Once 08/22/13 0905 08/22/13 1102   08/20/13 1100  vancomycin (VANCOCIN) IVPB 750 mg/150 ml premix     750 mg 150 mL/hr  over 60 Minutes Intravenous  Once 08/20/13 1059 08/20/13 1407   08/19/13 1800  vancomycin (VANCOCIN) 1,500 mg in sodium chloride 0.9 % 500 mL IVPB  Status:  Discontinued     1,500 mg 250 mL/hr over 120 Minutes Intravenous Every 48 hours 08/17/13 1800 08/17/13 1804   08/17/13 1900  vancomycin (VANCOCIN) 1,500 mg in sodium chloride 0.9 % 500 mL IVPB  Status:  Discontinued     1,500 mg 250 mL/hr over 120 Minutes Intravenous Every 48 hours 08/17/13 1804 08/18/13 1129   08/17/13 1830  vancomycin (VANCOCIN) 2,000 mg in sodium chloride 0.9 % 500 mL IVPB  Status:  Discontinued     2,000 mg 250 mL/hr over 120 Minutes Intravenous  Once 08/17/13 1759 08/17/13 1802   08/16/13 1200  vancomycin (VANCOCIN) IVPB 1000 mg/200 mL premix  Status:  Discontinued     1,000 mg 200 mL/hr over 60 Minutes Intravenous Every 12 hours 08/16/13 0344 08/16/13 0819   08/16/13 0900  cefTRIAXone (ROCEPHIN) 1 g in dextrose 5 % 50 mL IVPB     1 g 100 mL/hr over 30 Minutes Intravenous Every 24 hours 08/16/13 0819     08/16/13 0300  ertapenem (INVANZ) 1 g in sodium chloride 0.9 % 50 mL IVPB  1 g 100 mL/hr over 30 Minutes Intravenous  Once 08/16/13 0236 08/16/13 0349   08/15/13 2315  vancomycin (VANCOCIN) IVPB 1000 mg/200 mL premix     1,000 mg 200 mL/hr over 60 Minutes Intravenous  Once 08/15/13 2302 08/16/13 0316      Assessment/Plan: Left lateral thigh cellulitis and abscess  S/p I&D 08/25/12 Dr. Derrell Lolling POD #3 BID wet to dry dressing changes, pre-medicate with oral pain meds. Will need home health to aide with dressing changes  Complete oral antibiotics course. Follow up appt scheduled in our office.     LOS: 13 days    Gaynor Genco, [facility] 08/28/2013

## 2013-08-28 NOTE — Progress Notes (Signed)
Occupational Therapy Treatment Patient Details Name: [name] MRN: [id number] DOB: Jun 13, 1961 Today's Date: 08/28/2013 Time: 4825-0037 OT Time Calculation (min): 11 min  OT Assessment / Plan / Recommendation  History of present illness Patient is a 53 yo female admitted with 9 day history of LLE edema and reddness following a fall.  Patient dx with cellulitis.  Patient with h/o DM with neuropathy, Rt BKA with prosthesis, HTN, PVD, CHF, anxiety.   OT comments  Pt declining ADLs this morning, but agreeable to toilet transfers and toileting at [facility]. Pt planning to d/c home today. OT reiterated to pt the importance/benefits of OOB activity  Follow Up Recommendations  Home health OT;Supervision/Assistance - 24 hour    Barriers to Discharge   none    Equipment Recommendations  3 in 1 bedside comode    Recommendations for Other Services    Frequency Min 2X/week   Progress towards OT Goals Progress towards OT goals: Progressing toward goals  Plan Discharge plan remains appropriate    Precautions / Restrictions Precautions Precautions: Fall Required Braces or Orthoses: Other Brace/Splint Other Brace/Splint: Rt LE prosthesis, custom diabetic shoe left foot Restrictions Weight Bearing Restrictions: No   Pertinent Vitals/Pain 4/10 L LE    ADL  Toilet Transfer: Performed;Supervision/safety Statistician Method: Sit to Barista: Materials engineer and Hygiene: Performed;Supervision/safety;Min guard Where Assessed - Engineer, mining and Hygiene: Standing ADL Comments: Pt declined ADLs stating that she was leaving today and woudl wait to get home to take a shower and will wait til last minute to get dressed. Pt was agreeable to toileting    OT Diagnosis:    OT Problem List:   OT Treatment Interventions:     OT Goals(current goals can now be found in the care plan section) Acute Rehab OT Goals Patient Stated  Goal: go home  Visit Information  Last OT Received On: 08/28/13 Assistance Needed: +1 History of Present Illness: Patient is a 53 yo female admitted with 9 day history of LLE edema and reddness following a fall.  Patient dx with cellulitis.  Patient with h/o DM with neuropathy, Rt BKA with prosthesis, HTN, PVD, CHF, anxiety.    Subjective Data      Prior Functioning       Cognition  Cognition Arousal/Alertness: Awake/alert Behavior During Therapy: WFL for tasks assessed/performed Overall Cognitive Status: Within Functional Limits for tasks assessed    Mobility  Bed Mobility Overal bed mobility: Independent Transfers Overall transfer level: Needs assistance Equipment used: Rolling walker (2 wheeled) Transfers: Sit to/from Stand Sit to Stand: Supervision General transfer comment: needs set up assist, assist to don shoe.  Pt frustrated at her prosthesis not fitting, at first refuses to take therapist's suggestion for removing a sock since her limb is swollen.  After several minutes of frustration pt removed sock and was able to don prosthesis.  ? whether pt with be self motivated to get OOB at home          Balance    End of Session OT - End of Session Activity Tolerance: Patient tolerated treatment well Patient left: in bed;with call bell/phone within reach;with family/visitor present  GO     Galen Manila 08/28/2013, 1:13 PM

## 2013-08-28 NOTE — Progress Notes (Signed)
Home with HHN dressing changes.  Wilmon Arms. Corliss Skains, MD, [facility] Surgery  General/ Trauma Surgery  08/28/2013 3:03 PM

## 2013-08-28 NOTE — Discharge Summary (Signed)
PATIENT DETAILS Name: [name] Age: 53 y.o. Sex: female Date of Birth: [birth year] MRN: [id number]. Admit Date: 08/15/2013 Admitting Physician: Ron Parker, MD QIO:[id number], DAVID C, MD  Recommendations for Outpatient Follow-up:  1. Gen. health maintenance 2. Please recheck electrolytes in one week  PRIMARY DISCHARGE DIAGNOSIS:  Principal Problem:   Cellulitis of left leg with abscess formation Active Problems:   Emphysematous cystitis   Diabetes mellitus type 2, uncontrolled, with complications   Hyponatremia   HTN (hypertension)   Leukocytosis   ARF (acute renal failure)   Chronic diastolic heart failure   Sepsis    Hyperkalemia      PAST MEDICAL HISTORY: Past Medical History  Diagnosis Date  . Diabetic neuropathy   . Cellulitis   . Thyroid disease   . Hyperlipidemia   . Heart murmur   . Neuromuscular disorder   . Concussion as a teenager    slight  . Diarrhea     pt takes Reglan tid  . Hypothyroidism     takes Synthroid daily  . Depression     takes CYmbalta daily  . Peripheral edema     takes Lasix daily  . MRSA (methicillin resistant staph aureus) culture positive     hx of 2012  . Staph infection 2007  . Complication of anesthesia 01/2013    "didn't know where I was; who I was; talking out the top of my head" (30-Apr-2013)  . Hypertension   . Peripheral vascular disease   . CHF (congestive heart failure)   . Pneumonia 1980's    "hospitalized w/it" (30-Apr-2013)  . Chronic bronchitis   . Orthopnea     "just one; yesterday morning" (04-30-13)  . Type II diabetes mellitus     Novolog,Levemir,and Lovaza daily  . GERD (gastroesophageal reflux disease)   . Daily headache   . Rheumatoid arthritis     "both knees" (30-Apr-2013)  . Anxiety   . Kidney stone     "passed on before" (2013-04-30)    DISCHARGE MEDICATIONS:   Medication List    STOP taking these medications       clindamycin 300 MG capsule  Commonly known as:  CLEOCIN      gabapentin 800 MG tablet  Commonly known as:  NEURONTIN  Replaced by:  gabapentin 100 MG capsule     hydrochlorothiazide 12.5 MG capsule  Commonly known as:  MICROZIDE     lisinopril 5 MG tablet  Commonly known as:  PRINIVIL,ZESTRIL     nystatin 100000 UNIT/GM Powd     oxyCODONE-acetaminophen 10-325 MG per tablet  Commonly known as:  PERCOCET      TAKE these medications       albuterol 108 (90 BASE) MCG/ACT inhaler  Commonly known as:  PROVENTIL HFA;VENTOLIN HFA  Inhale 2 puffs into the lungs every 6 (six) hours as needed for shortness of breath.     amLODipine 10 MG tablet  Commonly known as:  NORVASC  Take 1 tablet (10 mg total) by mouth daily.     aspirin EC 81 MG tablet  Take 81 mg by mouth daily.     atorvastatin 80 MG tablet  Commonly known as:  LIPITOR  Take 80 mg by mouth daily.     BREO ELLIPTA 100-25 MCG/INH Aepb  Generic drug:  Fluticasone Furoate-Vilanterol  Inhale 1 puff into the lungs daily.     ciprofloxacin 500 MG tablet  Commonly known as:  CIPRO  Take 1 tablet (500 mg total) by  mouth 2 (two) times daily.     doxycycline 50 MG capsule  Commonly known as:  VIBRAMYCIN  Take 1 capsule (50 mg total) by mouth 2 (two) times daily.     feeding supplement (GLUCERNA SHAKE) Liqd  Take 237 mLs by mouth daily.     FLUoxetine 20 MG tablet  Commonly known as:  PROZAC  Take 20 mg by mouth 2 (two) times daily.     furosemide 40 MG tablet  Commonly known as:  LASIX  Take 1 tablet (40 mg total) by mouth daily.     gabapentin 100 MG capsule  Commonly known as:  NEURONTIN  Take 1 capsule (100 mg total) by mouth 2 (two) times daily.     insulin detemir 100 UNIT/ML injection  Commonly known as:  LEVEMIR  Inject 140 Units into the skin every morning. *uses 70 units on each side     levothyroxine 25 MCG tablet  Commonly known as:  SYNTHROID, LEVOTHROID  Take 25 mcg by mouth every morning.     metoCLOPramide 5 MG tablet  Commonly known as:  REGLAN  Take 5  mg by mouth 3 (three) times daily.     NOVOLOG 100 UNIT/ML injection  Generic drug:  insulin aspart  Inject 36-48 Units into the skin 3 (three) times daily with meals. *per sliding scale     omeprazole 20 MG capsule  Commonly known as:  PRILOSEC  Take 20 mg by mouth daily.     oxybutynin 5 MG tablet  Commonly known as:  DITROPAN  Take 5 mg by mouth every morning.     oxyCODONE 5 MG immediate release tablet  Commonly known as:  Oxy IR/ROXICODONE  Take 1-2 tablets (5-10 mg total) by mouth every 4 (four) hours as needed for moderate pain.     tiotropium 18 MCG inhalation capsule  Commonly known as:  SPIRIVA  Place 18 mcg into inhaler and inhale daily.        ALLERGIES:   Allergies  Allergen Reactions  . Gadolinium      Code: VOM, Desc: Pt began vomiting immed post infusion of multihance, Onset Date: [id number]   . Ivp Dye [Iodinated Diagnostic Agents] Nausea And Vomiting  . Metformin Other (See Comments)     diarrhea  . Penicillins Hives    BRIEF HPI:  See H&P, Labs, Consult and Test reports for all details in brief, Patient is a 53 year old female with a past medical history of type 2 diabetes, right BKA, chronic diastolic heart failure, hypertension who was admitted on 08/16/13 with left thigh swelling and redness that started after she sustained a fall approximately 9 days prior to this admission. She was evaluated in the emergency room, thought to have cellulitis associated with acute renal failure. She was started on vancomycin and Rocephin, and subsequently admitted to the hospitalist service. A CT scan of the left thigh on admission showed cellulitis without any abscess, however it also showed questionable emphysematous cystitis. A subsequent CT scan of the abdomen did confirm emphysematous cystitis.   CONSULTATIONS:   nephrology, general surgery and urology  PERTINENT RADIOLOGIC STUDIES: Ct Abdomen Pelvis Wo Contrast  08/16/2013   CLINICAL DATA:  Abdominal pain.   EXAM: CT ABDOMEN AND PELVIS WITHOUT CONTRAST  TECHNIQUE: Multidetector CT imaging of the abdomen and pelvis was performed following the standard protocol without intravenous contrast.  COMPARISON:  CT of the left femur performed 08/15/2013, and CT of the pelvis performed 01/23/2013  FINDINGS: The visualized lung bases  are clear.  The liver and spleen are unremarkable in appearance. The gallbladder is within normal limits. The pancreas and left adrenal gland are unremarkable. A 1.2 cm right adrenal lesion is noted, too high in attenuation to characterize as an adrenal adenoma on this study.  Residual contrast is seen within the renal calyces. Nonspecific perinephric stranding is noted bilaterally. A small 1.0 cm hypodensity at the lower pole of the right kidney may reflect a small cyst. There is no evidence of hydronephrosis. No definite obstructing ureteral stones are seen.  No free fluid is identified. The small bowel is unremarkable in appearance. The stomach is within normal limits. No acute vascular abnormalities are seen.  The appendix is normal in caliber and contains air, without evidence for appendicitis. The colon is unremarkable in appearance.  The bladder is largely filled with contrast. The previously noted debris and air in the bladder are displaced by contrast. The amount of debris seems decreased from the recent prior study; some of the debris may have been passed in the urine. Scattered tiny foci of air are again seen outside the bladder wall; the appearance raises suspicion for emphysematous cystitis. The uterus is unremarkable in appearance. The ovaries are relatively symmetric; no suspicious adnexal masses are seen. Enlarged left inguinal nodes are again seen.  No acute osseous abnormalities are identified.  IMPRESSION: 1. Previously noted debris and air in the bladder are displaced by contrast. The amount of debris appears decreased from the recent prior study; some of the debris may have been  passed in the urine. Scattered tiny foci of air again seen outside the bladder wall; the appearance raises suspicion for emphysematous cystitis. 2. Small right renal cyst. 3. 1.2 cm right adrenal lesion, too high in attenuation to characterize as an adrenal adenoma on this study. Adrenal protocol CT or MRI could be considered for further evaluation, when and as deemed clinically appropriate. 4. Enlarged left inguinal nodes again seen.   Electronically Signed   By: Roanna Raider M.D.   On: 08/16/2013 03:51   Ct Head Wo Contrast  08/20/2013   CLINICAL DATA:  Altered mental status.  EXAM: CT HEAD WITHOUT CONTRAST  TECHNIQUE: Contiguous axial images were obtained from the base of the skull through the vertex without intravenous contrast.  COMPARISON:  10/06/2011 CT.  05/03/2011 MR.  FINDINGS: No intracranial hemorrhage.  No CT evidence of large acute infarct  No intracranial mass lesion noted on this unenhanced exam.  Calcification adjacent to the superior aspect of the falx unchanged and probably an incidental finding rather than and a meningioma.  Mastoid air cells, middle ear cavities and visualized paranasal sinuses are clear. Very mild exophthalmos.  IMPRESSION: No acute abnormality.  Please see above.   Electronically Signed   By: Bridgett Larsson M.D.   On: 08/20/2013 11:44   Ct Femur Left W Contrast  08/16/2013   CLINICAL DATA:  Left thigh swelling and erythema, with left lateral thigh pain just above the knee.  EXAM: CT OF THE LEFT FEMUR WITH CONTRAST  TECHNIQUE: Multidetector CT imaging of the left femur was performed according to the standard protocol following intravenous contrast administration. Multiplanar CT image reconstructions were also generated.  CONTRAST:  OMNIPAQUE IOHEXOL 300 MG/ML  SOLN  COMPARISON:  Left knee radiographs performed 01/16/2006, and CT of the pelvis performed 01/23/2013  FINDINGS: There is diffuse soft tissue inflammation and edema along the lateral aspect of the left  thigh, extending inferiorly below the left knee. Associated significant  skin thickening is seen. Findings are most compatible with cellulitis. There is no evidence of underlying abscess at this time. There is minimal associated soft tissue edema underlying the fascia, though the underlying musculature is generally intact.  There is no evidence of vascular compromise. Very small knee joint effusion is noted. The knee joint is otherwise grossly unremarkable in appearance. Enlarged left inguinal nodes are seen, measuring up to 1.6 cm in short axis. Scattered prominent collateral vessels are noted along the visualized anterior abdominal wall.  The left hip joint is unremarkable in appearance. No hip joint effusion is identified. The visualized portions of the uterus are grossly unremarkable. Note is made of a large amount of debris and solid material filling the bladder, with trace air noted outside of the bladder. This appears new from the CT of the pelvis performed 01/23/2013.  No acute osseous abnormalities are identified.  IMPRESSION: 1. Diffuse soft tissue inflammation and edema along the lateral aspect of the left thigh, extending inferiorly below the left knee, with associated skin thickening, compatible with cellulitis. No evidence of underlying abscess at this time. Minimal associated soft tissue edema seen underlying the fascia, though the underlying musculature is grossly intact. 2. There is small knee joint effusion noted. 3. Left inguinal lymphadenopathy seen, with nodes measuring up to 1.6 cm in short axis. 4. Prominent collateral vessels noted along the anterior abdominal wall. 5. Large amount of debris and solid material filling the bladder, with trace air noted outside the bladder. This is new from the CT of the pelvis performed 01/23/2013. This is a very unusual appearance, and could conceivably reflect a colovesical fistula, or possibly severe emphysematous cystitis. Would correlate for associated  symptoms.  These results were called by telephone at the time of interpretation on 08/16/2013 at 1:27 AM to Dr. Eber Hong , who verbally acknowledged these results.   Electronically Signed   By: Roanna Raider M.D.   On: 08/16/2013 01:29   US Renal  08/19/2013   CLINICAL DATA:  Increasing creatinine.  EXAM: RENAL/URINARY TRACT ULTRASOUND COMPLETE  COMPARISON:  CT abdomen and pelvis 08/16/2013  FINDINGS: Right Kidney:  Length: 13.1 cm. Normal parenchymal thickness with mild increased cortical echotexture. Changes suggest medical renal disease. No hydronephrosis or solid mass.  Left Kidney:  Length: 12.4 cm. Normal parenchymal thickness with mild increased cortical echotexture. Changes suggest medical renal disease. No hydronephrosis or solid mass.  Bladder:  Bladder is decompressed and not visualized.  IMPRESSION: The Mild increased parenchymal echotexture in both kidneys suggesting medical renal disease. No evidence of hydronephrosis. No significant atrophy.   Electronically Signed   By: Burman Nieves M.D.   On: 08/19/2013 02:01   Dg Chest Port 1 View  08/24/2013   CLINICAL DATA:  Short of breath  EXAM: PORTABLE CHEST - 1 VIEW  COMPARISON:  DG CHEST 2V dated 07/09/2013  FINDINGS: Exam is lordotic. Interval enlargement of the cardiac silhouette. There are low lung volumes. There is increased perihilar fine airspace disease compared to prior. No pneumothorax.  IMPRESSION: New cardiomegaly and pulmonary edema suggests congestive heart failure.   Electronically Signed   By: Genevive Bi M.D.   On: 08/24/2013 16:38     PERTINENT LAB RESULTS: CBC: No results found for this basename: WBC, HGB, HCT, PLT,  in the last 72 hours CMET CMP     Component Value Date/Time   NA 142 08/28/2013 0554   K 4.9 08/28/2013 0554   CL 96 08/28/2013 0554   CO2  31 08/28/2013 0554   GLUCOSE 81 08/28/2013 0554   BUN 26* 08/28/2013 0554   CREATININE 1.15* 08/28/2013 0554   CALCIUM 9.0 08/28/2013 0554   PROT 7.4 08/17/2013 1030    ALBUMIN 1.7* 08/17/2013 1030   AST 12 08/17/2013 1030   ALT 9 08/17/2013 1030   ALKPHOS 127* 08/17/2013 1030   BILITOT 0.2* 08/17/2013 1030   GFRNONAA 54* 08/28/2013 0554   GFRAA 62* 08/28/2013 0554    GFR Estimated Creatinine Clearance: 68.4 ml/min (by C-G formula based on Cr of 1.15). No results found for this basename: LIPASE, AMYLASE,  in the last 72 hours  Recent Labs  08/27/13 0431  CKTOTAL 47   No components found with this basename: POCBNP,  No results found for this basename: DDIMER,  in the last 72 hours No results found for this basename: HGBA1C,  in the last 72 hours No results found for this basename: CHOL, HDL, LDLCALC, TRIG, CHOLHDL, LDLDIRECT,  in the last 72 hours No results found for this basename: TSH, T4TOTAL, FREET3, T3FREE, THYROIDAB,  in the last 72 hours No results found for this basename: VITAMINB12, FOLATE, FERRITIN, TIBC, IRON, RETICCTPCT,  in the last 72 hours Coags: No results found for this basename: PT, INR,  in the last 72 hours Microbiology: Recent Results (from the past 240 hour(s))  URINE CULTURE     Status: None   Collection Time    08/19/13 12:25 PM      Result Value Range Status   Specimen Description URINE, CATHETERIZED   Final   Special Requests PT ON CEFTRIAXONE VANCO   Final   Culture  Setup Time     Final   Value: 08/19/2013 17:57     Performed at [facility] Count     Final   Value: NO GROWTH     Performed at [facility]   Culture     Final   Value: NO GROWTH     Performed at [facility]   Report Status 08/20/2013 FINAL   Final  SURGICAL PCR SCREEN     Status: None   Collection Time    08/25/13  5:59 AM      Result Value Range Status   MRSA, PCR NEGATIVE  NEGATIVE Final   Staphylococcus aureus NEGATIVE  NEGATIVE Final   Comment:            The Xpert SA Assay (FDA     approved for NASAL specimens     in patients over 89 years of age),     is one component of     a comprehensive  surveillance     program.  Test performance has     been validated by [facility] for patients greater     than or equal to 34 year old.     It is not intended     to diagnose infection nor to     guide or monitor treatment.  CULTURE, ROUTINE-ABSCESS     Status: None   Collection Time    08/25/13  4:00 PM      Result Value Range Status   Specimen Description ABSCESS LEG LEFT   Final   Special Requests LEFT LATERAL THIGH PT ON VANCO ROCEPHIN   Final   Gram Stain     Final   Value: MODERATE WBC PRESENT,BOTH PMN AND MONONUCLEAR     NO SQUAMOUS EPITHELIAL CELLS SEEN  NO ORGANISMS SEEN     Performed at [facility]     Final   Value: Culture reincubated for better growth     Performed at [facility]   Report Status PENDING   Incomplete  ANAEROBIC CULTURE     Status: None   Collection Time    08/25/13  4:00 PM      Result Value Range Status   Specimen Description ABSCESS LEG LEFT   Final   Special Requests LEFT LATERAL THIGH PT ON VANCO ROCEPHIN   Final   Gram Stain     Final   Value: MODERATE WBC PRESENT,BOTH PMN AND MONONUCLEAR     NO SQUAMOUS EPITHELIAL CELLS SEEN     NO ORGANISMS SEEN     Performed at [facility]   Culture     Final   Value: NO ANAEROBES ISOLATED; CULTURE IN PROGRESS FOR 5 DAYS     Performed at [facility]   Report Status PENDING   Incomplete     BRIEF HOSPITAL COURSE:  Sepsis secondary to Cellulitis of left leg  - Sepsis has resolved with IV antibiotics and IV fluids  - Blood culture on 08/16/13 negative  - Urine culture on 08/19/13 negative  - Intraoperative wound cultures on 08/25/13-negative so far   Left thigh cellulitis with abscess formation  - Was empiric antibiotics-vancomycin and ceftriaxone day 13 on day of discharge, will be transitioned to doxycycline for 5 more days. - general surgery consulted-to the OR for incision and drainage 1/5  - Intraoperative wound care cultures negative so far   - Patient will need followup with general surgery on discharge.  Emphysematous Cystitis  - Urine culture obtained when patient was already on antibiotics, therefore urine culture negative.  - Continue with IV Rocephin- plan for a total of 14 days of antibiotics  - Urology was consulted- plans are to continue with antibiotics for a total of 2 weeks. Foley catheter has been discontinued.   Acute renal failure  - Multifactorial-dehydration, contrast, sepsis and possibly to obstruction  - Creatinine significantly better, repeat electrolytes periodically  - Nephrology consulted, and has signed off   Acute hypoxic respiratory failure  - Suspected secondary to mild CHF decompensation/COPD  - Required oxygen- now off oxygen, was treated with nebs, and Lasix - Incentive spirometry  Acute diastolic heart failure with mild decompensation  - Initially started on IV Lasix, now transitioned to oral Lasix, electrolytes will need to be checked periodically as an outpatient  Hyperkalemia  -? Etiology  - Noted on 1/6 and 1/7 -treated with IV insulin/D50/Kayexalate on both occasions. Beta blocker discontinued. CK within normal limits. This has subsequently resolved. Potassium on 1/8 within normal   Hypertension  - Fluctuating, but with moderate control c/w amlodipine Lasix   Diabetes  - CBGs stable  - Continue with Levemir and NovoLog discharge  Anemia  - Suspect secondary to acute illness/dilutional, no evidence of overt loss  - Monitor hemoglobin and hematocrit periodically  Hyperkalemia/hyponatremia  - Resolved   Hypothyroidism  - Continue Synthroid  TODAY-DAY OF DISCHARGE:  Subjective:   [name] today has no headache,no chest abdominal pain,no new weakness tingling or numbness, feels much better wants to go home today.   Objective:   Blood pressure 156/68, pulse 63, temperature 98.1 F (36.7 C), temperature source Oral, resp. rate 18, height 5\' 5"  (1.651 m), weight 103.692  kg (228 lb 9.6 oz), SpO2 94.00%.  Intake/Output Summary (Last  24 hours) at 08/28/13 1027 Last data filed at 08/28/13 0500  Gross per 24 hour  Intake    600 ml  Output    550 ml  Net     50 ml   Filed Weights   08/25/13 0520 08/25/13 2209 08/27/13 2100  Weight: 105.325 kg (232 lb 3.2 oz) 103.783 kg (228 lb 12.8 oz) 103.692 kg (228 lb 9.6 oz)    Exam Awake Alert, Oriented *3, No new F.N deficits, Normal affect Welcome.AT,PERRAL Supple Neck,No JVD, No cervical lymphadenopathy appriciated.  Symmetrical Chest wall movement, Good air movement bilaterally, CTAB RRR,No Gallops,Rubs or new Murmurs, No Parasternal Heave +ve B.Sounds, Abd Soft, Non tender, No organomegaly appriciated, No rebound -guarding or rigidity. No Cyanosis, Clubbing or edema, No new Rash or bruise  DISCHARGE CONDITION: Stable  DISPOSITION: Home with home health services  DISCHARGE INSTRUCTIONS:    Activity:  As tolerated   Diet recommendation: Diabetic Diet Heart Healthy diet      Discharge Orders   Future Orders Complete By Expires   (HEART FAILURE PATIENTS) Call MD:  Anytime you have any of the following symptoms: 1) 3 pound weight gain in 24 hours or 5 pounds in 1 week 2) shortness of breath, with or without a dry hacking cough 3) swelling in the hands, feet or stomach 4) if you have to sleep on extra pillows at night in order to breathe.  As directed    Call MD for:  redness, tenderness, or signs of infection (pain, swelling, redness, odor or green/yellow discharge around incision site)  As directed    Call MD for:  temperature >100.4  As directed    Diet - low sodium heart healthy  As directed    Increase activity slowly  As directed       Follow-up Information   Follow up with TALBOT, DAVID C, MD. Schedule an appointment as soon as possible for a visit in 1 week.   Specialty:  Internal Medicine   Contact information:   [address] [place] [address] [phone]       Follow up  with Marigene Ehlersamirez Jr., Jed LimerickArmando, MD. Schedule an appointment as soon as possible for a visit in 2 weeks.   Specialty:  General Surgery   Contact information:   [address] [place] [address] [phone]         Total Time spent on discharge equals 45 minutes.  SignedJeoffrey Massed: Lovely Kerins 08/28/2013 10:27 AM

## 2013-08-28 NOTE — Progress Notes (Signed)
Physical Therapy Treatment Patient Details Name: [name] MRN: [id number] DOB: October 17, 1960 Today's Date: 08/28/2013 Time: 6244-6950 PT Time Calculation (min): 24 min  PT Assessment / Plan / Recommendation  History of Present Illness Patient is a 53 yo female admitted with 9 day history of LLE edema and reddness following a fall.  Patient dx with cellulitis.  Patient with h/o DM with neuropathy, Rt BKA with prosthesis, HTN, PVD, CHF, anxiety.   PT Comments   Pt frustrated this session, requires increased encouragement to participate with therapy.  Pt able to gait and perform mobility with supervision/set up assistance.  ? Pt's motivation/ability to get OOB at home as she lives alone.  PT would recommend SNF for nursing and mobility care and training, pt refuses SNF at this time.  Follow Up Recommendations  Home health PT;Supervision for mobility/OOB     Does the patient have the potential to tolerate intense rehabilitation     Barriers to Discharge        Equipment Recommendations  Rolling walker with 5" wheels    Recommendations for Other Services    Frequency Min 3X/week   Progress towards PT Goals Progress towards PT goals: Progressing toward goals  Plan Current plan remains appropriate    Precautions / Restrictions Precautions Precautions: Fall Other Brace/Splint: Rt LE prosthesis, custom diabetic shoe left foot Restrictions Weight Bearing Restrictions: No   Pertinent Vitals/Pain Pt c/o pain in L LE, RN aware.    Mobility  Bed Mobility Overal bed mobility: Independent Transfers Overall transfer level: Needs assistance Equipment used: Rolling walker (2 wheeled) Transfers: Sit to/from Stand Sit to Stand: Supervision General transfer comment: needs set up assist, assist to don shoe.  Pt frustrated at her prosthesis not fitting, at first refuses to take therapist's suggestion for removing a sock since her limb is swollen.  After several minutes of frustration pt  removed sock and was able to don prosthesis.  ? whether pt with be self motivated to get OOB at home Ambulation/Gait Ambulation/Gait assistance: Supervision Ambulation Distance (Feet): 80 Feet Assistive device: Rolling walker (2 wheeled) General Gait Details: limited due to pt frustration    Exercises     PT Diagnosis:    PT Problem List:   PT Treatment Interventions:     PT Goals (current goals can now be found in the care plan section)    Visit Information  Last PT Received On: 08/28/13 Assistance Needed: +1 History of Present Illness: Patient is a 53 yo female admitted with 9 day history of LLE edema and reddness following a fall.  Patient dx with cellulitis.  Patient with h/o DM with neuropathy, Rt BKA with prosthesis, HTN, PVD, CHF, anxiety.    Subjective Data      Cognition  Cognition Arousal/Alertness: Awake/alert Behavior During Therapy: WFL for tasks assessed/performed Overall Cognitive Status: Within Functional Limits for tasks assessed    Balance     End of Session PT - End of Session Equipment Utilized During Treatment: Gait belt Activity Tolerance: Patient limited by fatigue Patient left: in bed;with call bell/phone within reach;with family/visitor present Nurse Communication: Mobility status   GP     Carollee Nussbaumer 08/28/2013, 11:28 AM

## 2013-08-28 NOTE — Progress Notes (Signed)
1433 08/28/13 nsg Pt. d/c to home explained discharge instructions and understood.

## 2013-08-30 LAB — ANAEROBIC CULTURE

## 2013-09-09 ENCOUNTER — Telehealth (INDEPENDENT_AMBULATORY_CARE_PROVIDER_SITE_OTHER): Payer: Self-pay

## 2013-09-09 NOTE — Telephone Encounter (Signed)
Chris at [facility] called wanting to give update on wd. She states the wd is 3cm x 1 " and has tunneling from 1 oclock to 3 oclock area .5cm deep. Wound is pink but not red or feverish. No odor. She also request order to change from 1" iodoform packing to 1/2 " iodoform packing. She can be reached at [phone]. I advised her that this would be sent to Dr Derrell Lolling and Neysa Bonito.

## 2013-09-10 ENCOUNTER — Telehealth (INDEPENDENT_AMBULATORY_CARE_PROVIDER_SITE_OTHER): Payer: Self-pay

## 2013-09-10 NOTE — Telephone Encounter (Signed)
Called and spoke to [facility] to give wound care instructions.  Per Dr. Derrell Lolling change the wound with iodoform guaze BID.  Also, made Thayer Ohm aware that patient will need to be seen next week.  I will notify patient of appt scheduled for 09/16/13 @ 2:30 pm in [facility]

## 2013-09-10 NOTE — Telephone Encounter (Signed)
Called and spoke to patient regarding appointment for next week.  Due to transportation issues patient cannot come in except late in the day.  Appointment made in urgent office for 09/17/13 @ 4:00 PM.

## 2013-09-10 NOTE — Telephone Encounter (Signed)
Message copied by Maryan Puls on Wed Sep 10, 2013 11:59 AM ------      Message from: Axel Filler      Created: Tue Sep 09, 2013 12:45 PM       She can change the wound with iodoform guaze..  They should be changing the packing BID.  She should also have a f/u appt with [facility].      Thanks      AR ------

## 2013-09-16 ENCOUNTER — Encounter (INDEPENDENT_AMBULATORY_CARE_PROVIDER_SITE_OTHER): Payer: Medicaid Other

## 2013-09-17 ENCOUNTER — Encounter (INDEPENDENT_AMBULATORY_CARE_PROVIDER_SITE_OTHER): Payer: Self-pay | Admitting: Surgery

## 2013-09-17 ENCOUNTER — Ambulatory Visit (INDEPENDENT_AMBULATORY_CARE_PROVIDER_SITE_OTHER): Payer: Medicaid Other | Admitting: Surgery

## 2013-09-17 VITALS — BP 130/81 | HR 80 | Temp 98.7°F | Resp 18

## 2013-09-17 DIAGNOSIS — S81009A Unspecified open wound, unspecified knee, initial encounter: Secondary | ICD-10-CM

## 2013-09-17 DIAGNOSIS — S91009A Unspecified open wound, unspecified ankle, initial encounter: Principal | ICD-10-CM

## 2013-09-17 DIAGNOSIS — S81809A Unspecified open wound, unspecified lower leg, initial encounter: Principal | ICD-10-CM

## 2013-09-17 MED ORDER — DOXYCYCLINE HYCLATE 50 MG PO CAPS: 100.0000 mg | ORAL_CAPSULE | Freq: Two times a day (BID) | ORAL | Status: DC

## 2013-09-17 NOTE — Patient Instructions (Signed)
Stop packing. Return 1 week for Dr Derrell Lolling. Start antibiotics.

## 2013-09-17 NOTE — Progress Notes (Signed)
Patient returns for wound check. She underwent incision and drainage 3 mass weeks ago by Dr. Derrell Lolling involving abscess left lateral thigh. It's not causing too much pain at this point. It's being packed.  Exam: Open wound noted to left lateral thigh measuring 1 cm. Small amount of packing removed. Minimal erythema or fluctuance. No significant tenderness. Area superior to that is mildly full but not fluctuant.  Impression: Status post debridement and drainage of left thigh abscess 3 and half weeks out by Dr. Derrell Lolling  Plan: The patient can stop packing the wound at this point. Return to clinic in 2 weeks to see Dr. Derrell Lolling or PA.

## 2013-09-24 ENCOUNTER — Other Ambulatory Visit: Payer: Self-pay | Admitting: Internal Medicine

## 2013-09-30 ENCOUNTER — Ambulatory Visit (INDEPENDENT_AMBULATORY_CARE_PROVIDER_SITE_OTHER): Payer: Medicaid Other | Admitting: General Surgery

## 2013-09-30 ENCOUNTER — Encounter (INDEPENDENT_AMBULATORY_CARE_PROVIDER_SITE_OTHER): Payer: Self-pay

## 2013-09-30 VITALS — BP 150/68 | HR 72 | Temp 98.2°F | Resp 14 | Ht 65.0 in | Wt 219.4 lb

## 2013-09-30 DIAGNOSIS — L02419 Cutaneous abscess of limb, unspecified: Secondary | ICD-10-CM

## 2013-09-30 DIAGNOSIS — L03119 Cellulitis of unspecified part of limb: Principal | ICD-10-CM

## 2013-09-30 NOTE — Progress Notes (Signed)
Subjective: Left Leg Abscess  PROCEDURE: IRRIGATION AND DEBRIDEMENT EXTREMITY (Left)  SURGEON: Surgeon(s) and Role:   Axel Filler, MD - 08/25/2013  Hospitalized 08/15/14 thru 08/28/13;  Last visit 09/17/13 Dr. Luisa Hart:   Exam: Open wound noted to left lateral thigh measuring 1 cm. Small amount of packing removed. Minimal erythema or fluctuance. No significant tenderness. Area superior to that is mildly full but not fluctuant.  Impression: Status post debridement and drainage of left thigh abscess 3 and half weeks out by Dr. Derrell Lolling  Plan: The patient can stop packing the wound at this point. Return to clinic in 2 weeks to see Dr. Derrell Lolling or PA.      Today the wound is almost completely healed.  She has a boot on her lower leg, with ulcer left food that Orthopedic surgery is following.   Objective: Vital signs in last 24 hours: BP 150/68  Pulse 72  Temp(Src) 98.2 F (36.8 C) (Oral)  Resp 14  Ht 5\' 5"  (1.651 m)  Wt 99.519 kg (219 lb 6.4 oz)  BMI 36.51 kg/m2  General appearance: alert, cooperative, no distress and smells like tobacco Extremities: left lower thigh abscess site is healed, small eschar still present but healing well.   Medications: Prior to Admission medications   Medication Sig Start Date End Date Taking? Authorizing Provider  albuterol (PROVENTIL HFA;VENTOLIN HFA) 108 (90 BASE) MCG/ACT inhaler Inhale 2 puffs into the lungs every 6 (six) hours as needed for shortness of breath. 04/22/13  Yes Nishant Dhungel, MD  amLODipine (NORVASC) 10 MG tablet Take 1 tablet (10 mg total) by mouth daily. 08/28/13  Yes Shanker 10/26/13, MD  aspirin EC 81 MG tablet Take 81 mg by mouth daily.   Yes Historical Provider, MD  atorvastatin (LIPITOR) 80 MG tablet Take 80 mg by mouth daily.   Yes Historical Provider, MD  doxycycline (VIBRAMYCIN) 50 MG capsule Take 2 capsules (100 mg total) by mouth 2 (two) times daily. 09/17/13  Yes Thomas A. Cornett, MD  feeding supplement, GLUCERNA SHAKE,  (GLUCERNA SHAKE) LIQD Take 237 mLs by mouth daily. 08/28/13  Yes Shanker 10/26/13, MD  FLUoxetine (PROZAC) 20 MG tablet Take 20 mg by mouth 2 (two) times daily.    Yes Historical Provider, MD  Fluticasone Furoate-Vilanterol (BREO ELLIPTA) 100-25 MCG/INH AEPB Inhale 1 puff into the lungs daily.   Yes Historical Provider, MD  furosemide (LASIX) 40 MG tablet Take 1 tablet (40 mg total) by mouth daily. 08/28/13  Yes Shanker 10/26/13, MD  gabapentin (NEURONTIN) 100 MG capsule Take 1 capsule (100 mg total) by mouth 2 (two) times daily. 08/28/13  Yes Shanker 10/26/13, MD  insulin aspart (NOVOLOG) 100 UNIT/ML injection Inject 36-48 Units into the skin 3 (three) times daily with meals. *per sliding scale   Yes Historical Provider, MD  insulin detemir (LEVEMIR) 100 UNIT/ML injection Inject 140 Units into the skin every morning. *uses 70 units on each side   Yes Historical Provider, MD  levothyroxine (SYNTHROID, LEVOTHROID) 25 MCG tablet Take 25 mcg by mouth every morning.    Yes Historical Provider, MD  metoCLOPramide (REGLAN) 5 MG tablet Take 5 mg by mouth 3 (three) times daily.    Yes Historical Provider, MD  omeprazole (PRILOSEC) 20 MG capsule Take 20 mg by mouth daily.   Yes Historical Provider, MD  oxybutynin (DITROPAN) 5 MG tablet Take 5 mg by mouth every morning.     Yes Historical Provider, MD  oxyCODONE (OXY IR/ROXICODONE) 5 MG immediate release  tablet Take 1-2 tablets (5-10 mg total) by mouth every 4 (four) hours as needed for moderate pain. 08/28/13  Yes Shanker Levora Dredge, MD  sulfamethoxazole-trimethoprim (BACTRIM DS) 800-160 MG per tablet Take 1 tablet by mouth 2 (two) times daily.   Yes Historical Provider, MD  tiotropium (SPIRIVA) 18 MCG inhalation capsule Place 18 mcg into inhaler and inhale daily.   Yes Historical Provider, MD   CBC    Component Value Date/Time   WBC 7.2 08/25/2013 0722   RBC 3.02* 08/25/2013 0722   RBC 2.74* 08/21/2013 1314   HGB 8.3* 08/25/2013 0722   HCT 26.7* 08/25/2013 0722   PLT  393 08/25/2013 0722   MCV 88.4 08/25/2013 0722   MCH 27.5 08/25/2013 0722   MCHC 31.1 08/25/2013 0722   RDW 14.8 08/25/2013 0722   LYMPHSABS 2.0 08/15/2013 2256   MONOABS 1.2* 08/15/2013 2256   EOSABS 0.0 08/15/2013 2256   BASOSABS 0.0 08/15/2013 2256       Assessment/Plan Cellulitis of left leg with abscess formation  Ulcer LLE, being followed by Orthopedics. Emphysematous cystitis  Diabetes mellitus type 2, uncontrolled, with complications  Hyponatremia  HTN (hypertension)  Leukocytosis  ARF (acute renal failure)  Chronic diastolic heart failure  Sepsis  Hyperkalemia Ongoing tobacco use  PLan:  She can follow up with PCP for her medical issues, and orthopedics for her foot ulcer.  We will see again as needed.     Alter Moss 09/30/2013

## 2013-09-30 NOTE — Patient Instructions (Signed)
You can clean this site with soap and water, no dressing needed, just keep clean and dry between bathing. See your PCP for follow up of your medical issues.  Follow up with Orthopedic surgeon for your foot ulcer.

## 2013-10-01 ENCOUNTER — Other Ambulatory Visit: Payer: Self-pay | Admitting: Internal Medicine

## 2013-10-08 ENCOUNTER — Other Ambulatory Visit: Payer: Self-pay | Admitting: Internal Medicine

## 2013-10-20 ENCOUNTER — Emergency Department (INDEPENDENT_AMBULATORY_CARE_PROVIDER_SITE_OTHER)
Admission: EM | Admit: 2013-10-20 | Discharge: 2013-10-20 | Disposition: A | Discharge status: Home / Self Care | Payer: Commercial Managed Care - HMO | Source: Home / Self Care | Attending: Emergency Medicine | Admitting: Emergency Medicine

## 2013-10-20 ENCOUNTER — Encounter (HOSPITAL_COMMUNITY): Payer: Self-pay | Admitting: Emergency Medicine

## 2013-10-20 ENCOUNTER — Emergency Department (INDEPENDENT_AMBULATORY_CARE_PROVIDER_SITE_OTHER): Payer: Commercial Managed Care - HMO

## 2013-10-20 DIAGNOSIS — S161XXA Strain of muscle, fascia and tendon at neck level, initial encounter: Secondary | ICD-10-CM

## 2013-10-20 DIAGNOSIS — S139XXA Sprain of joints and ligaments of unspecified parts of neck, initial encounter: Secondary | ICD-10-CM

## 2013-10-20 DIAGNOSIS — S0083XA Contusion of other part of head, initial encounter: Secondary | ICD-10-CM

## 2013-10-20 DIAGNOSIS — S0003XA Contusion of scalp, initial encounter: Secondary | ICD-10-CM

## 2013-10-20 DIAGNOSIS — S1093XA Contusion of unspecified part of neck, initial encounter: Secondary | ICD-10-CM

## 2013-10-20 MED ORDER — HYDROCODONE-ACETAMINOPHEN 10-325 MG PO TABS: 1.0000 | ORAL_TABLET | Freq: Four times a day (QID) | ORAL | Status: DC | PRN

## 2013-10-20 MED ORDER — HYDROCODONE-ACETAMINOPHEN 5-325 MG PO TABS
2.0000 | ORAL_TABLET | Freq: Once | ORAL | Status: AC
  Administered 2013-10-20: 2 via ORAL

## 2013-10-20 MED ORDER — HYDROCODONE-ACETAMINOPHEN 5-325 MG PO TABS
ORAL_TABLET | ORAL | Status: AC
  Filled 2013-10-20: qty 2

## 2013-10-20 NOTE — ED Notes (Signed)
Pt triaged and assessed by provider.   Provider in before nurse. 

## 2013-10-20 NOTE — Discharge Instructions (Signed)
Head Injury, Adult  You have received a head injury. It does not appear serious at this time. Headaches and vomiting are common following head injury. It should be easy to awaken from sleeping. Sometimes it is necessary for you to stay in the emergency department for a while for observation. Sometimes admission to the hospital may be needed. After injuries such as yours, most problems occur within the first 24 hours, but side effects may occur up to 7 10 days after the injury. It is important for you to carefully monitor your condition and contact your health care provider or seek immediate medical care if there is a change in your condition.  WHAT ARE THE TYPES OF HEAD INJURIES?  Head injuries can be as minor as a bump. Some head injuries can be more severe. More severe head injuries include:  · A jarring injury to the brain (concussion).  · A bruise of the brain (contusion). This mean there is bleeding in the brain that can cause swelling.  · A cracked skull (skull fracture).  · Bleeding in the brain that collects, clots, and forms a bump (hematoma).  WHAT CAUSES A HEAD INJURY?  A serious head injury is most likely to happen to someone who is in a car wreck and is not wearing a seat belt. Other causes of major head injuries include bicycle or motorcycle accidents, sports injuries, and falls.  HOW ARE HEAD INJURIES DIAGNOSED?  A complete history of the event leading to the injury and your current symptoms will be helpful in diagnosing head injuries. Many times, pictures of the brain, such as CT or MRI are needed to see the extent of the injury. Often, an overnight hospital stay is necessary for observation.   WHEN SHOULD I SEEK IMMEDIATE MEDICAL CARE?   You should get help right away if:  · You have confusion or drowsiness.  · You feel sick to your stomach (nauseous) or have continued, forceful vomiting.  · You have dizziness or unsteadiness that is getting worse.  · You have severe, continued headaches not  relieved by medicine. Only take over-the-counter or prescription medicines for pain, fever, or discomfort as directed by your health care provider.  · You do not have normal function of the arms or legs or are unable to walk.  · You notice changes in the black spots in the center of the colored part of your eye (pupil).  · You have a clear or bloody fluid coming from your nose or ears.  · You have a loss of vision.  During the next 24 hours after the injury, you must stay with someone who can watch you for the warning signs. This person should contact local emergency services (911 in the [place] if you have seizures, you become unconscious, or you are unable to wake up.  HOW CAN I PREVENT A HEAD INJURY IN THE FUTURE?  The most important factor for preventing major head injuries is avoiding motor vehicle accidents.  To minimize the potential for damage to your head, it is crucial to wear seat belts while riding in motor vehicles. Wearing helmets while bike riding and playing collision sports (like football) is also helpful. Also, avoiding dangerous activities around the house will further help reduce your risk of head injury.   WHEN CAN I RETURN TO NORMAL ACTIVITIES AND ATHLETICS?  You should be reevaluated by your health care provider before returning to these activities. If you have any of the following symptoms, you should not return   to activities or contact sports until 1 week after the symptoms have stopped:  · Persistent headache.  · Dizziness or vertigo.  · Poor attention and concentration.  · Confusion.  · Memory problems.  · Nausea or vomiting.  · Fatigue or tire easily.  · Irritability.  · Intolerant of bright lights or loud noises.  · Anxiety or depression.  · Disturbed sleep.  MAKE SURE YOU:   · Understand these instructions.  · Will watch your condition.  · Will get help right away if you are not doing well or get worse.  Document Released: 08/07/2005 Document Revised: 05/28/2013 Document Reviewed:  04/14/2013  [employer]® Patient Information ©2014 [employer].

## 2013-10-20 NOTE — ED Provider Notes (Signed)
Chief Complaint   Chief Complaint  Patient presents with  . Head Injury    History of Present Illness   [name] is a 53 year old female who injured her head this past Friday, 4 days ago. The patient tripped over a Cam Walker boot that she was wearing at her brother's house and fell backward, striking the back of her head against a brick chin. There was a small amount of bleeding has been very tender since then. The tenderness extends down into the neck. She has some pain with neck movement. Her range of motion is limited. She denies any loss of consciousness. There is been no bleeding from the ears or nose. No diplopia or blurred vision. She does have some headache. She denies any nausea or vomiting. She's had no paresthesias, weakness, difficulty with speech, swallowing, ambulation, coordination, or balance. She denies any irritability, tinnitus, photosensitivity, foggy thinking, or difficulty focusing or thinking.  Review of Systems   Other than as noted above, the patient denies any of the following symptoms: Eye:  No eye pain, diplopia or blurred vision ENT:  No bleeding from nose or ears.  No loose or broken teeth. Neck:  No pain or limited ROM. GI:  No nausea or vomiting. Neuro:  No loss of consciousness, seizure activity, numbness, tingling, or weakness.  PMFSH   Past medical history, family history, social history, meds, and allergies were reviewed. She's allergic to IVP dye and penicillin. She's on a long list of medications including Norvasc, aspirin, Lipitor, Prozac, fluticasone, Lasix, Neurontin, NovoLog insulin, Levemir insulin, Synthroid, metoclopramide, Prilosec, Ditropan, oxycodone, and albuterol.  Physical Examination   Vital signs:  BP 131/62  Pulse 76  Temp(Src) 98.4 F (36.9 C) (Oral)  Resp 17  SpO2 94%  LMP 10/05/2013 General:  Alert and oriented times 3.  In no distress. Eye:  PERRL, full EOMs.  Lids and conjunctivas normal. Fundi benign. HEENT:  She  has extreme tenderness to palpation over the posterior occipital area. There is a tiny abrasion there which was healing up well. I cannot feel any lump or bruise, but she is extremely tender with even tenderness I touch her hair.  TMs and canals normal, nasal mucosa normal.  No oral lacerations.  Teeth were intact without obvious oral trauma. Neck:  Neck is tender to palpation. She only has about 45 of rotation in each direction with pain. Neurological:  Alert and oriented.  Cranial nerves intact.  No pronator drift. Finger to nose test was normal.  No muscle weakness. DTRs were symmetrical.  Sensation was intact to light touch. Gait was normal.  Romberg's sign negative.  Able to perform tandem gait well.  Radiology   Dg Cervical Spine Complete  10/20/2013   CLINICAL DATA:  Fall and hit back of head.  Posterior head pain.  EXAM: CERVICAL SPINE  4+ VIEWS  COMPARISON:  Cervical radiographs dated 08/01/2013  FINDINGS: Alignment of the cervical spine is normal. Degenerative facet changes at C2-C3. Prevertebral soft tissues are normal. Again noted is right neural foraminal narrowing at C3-C4 and C4-C5. The right foraminal narrowing has progressed. Mild left foraminal narrowing at C3-C4. Alignment at the cervicothoracic junction is normal. Patient is edentulous.  IMPRESSION: Cervical spondylosis without acute bone abnormalities. Progression of the right neural foraminal narrowing.   Electronically Signed   By: Richarda Overlie M.D.   On: 10/20/2013 14:17    I reviewed the images independently and personally and concur with the radiologist's findings.  Assessment  The primary encounter diagnosis was Scalp contusion. A diagnosis of Neck strain was also pertinent to this visit.  No indication for CT scan.  Plan   1.  Meds:  The following meds were prescribed:   New Prescriptions   HYDROCODONE-ACETAMINOPHEN (NORCO) 10-325 MG PER TABLET    Take 1 tablet by mouth every 6 (six) hours as needed.    2.  Patient  Education/Counseling:  The patient was given appropriate handouts, self care instructions, and instructed in symptomatic relief.    3.  Follow up:  The patient was told to follow up here if no better in 3 to 4 days,or sooner if becoming worse in any way, and given some red flag symptoms such as change in level of consciousness, worsening headache, visual changes, persistent vomiting, or neurological symptoms which would prompt immediate return.       Reuben Likes, MD 10/20/13 [phone]

## 2013-10-21 ENCOUNTER — Emergency Department (HOSPITAL_COMMUNITY): Payer: Medicare HMO

## 2013-10-21 ENCOUNTER — Emergency Department (HOSPITAL_COMMUNITY)
Admission: EM | Admit: 2013-10-21 | Discharge: 2013-10-21 | Disposition: A | Discharge status: Home / Self Care | Payer: Medicare HMO | Attending: Emergency Medicine | Admitting: Emergency Medicine

## 2013-10-21 ENCOUNTER — Encounter (HOSPITAL_COMMUNITY): Payer: Self-pay | Admitting: Emergency Medicine

## 2013-10-21 DIAGNOSIS — E1149 Type 2 diabetes mellitus with other diabetic neurological complication: Secondary | ICD-10-CM | POA: Insufficient documentation

## 2013-10-21 DIAGNOSIS — Z7982 Long term (current) use of aspirin: Secondary | ICD-10-CM | POA: Insufficient documentation

## 2013-10-21 DIAGNOSIS — S0083XA Contusion of other part of head, initial encounter: Principal | ICD-10-CM

## 2013-10-21 DIAGNOSIS — Z794 Long term (current) use of insulin: Secondary | ICD-10-CM | POA: Insufficient documentation

## 2013-10-21 DIAGNOSIS — W19XXXA Unspecified fall, initial encounter: Secondary | ICD-10-CM

## 2013-10-21 DIAGNOSIS — S88119A Complete traumatic amputation at level between knee and ankle, unspecified lower leg, initial encounter: Secondary | ICD-10-CM | POA: Insufficient documentation

## 2013-10-21 DIAGNOSIS — F411 Generalized anxiety disorder: Secondary | ICD-10-CM | POA: Insufficient documentation

## 2013-10-21 DIAGNOSIS — Z880 Allergy status to penicillin: Secondary | ICD-10-CM | POA: Insufficient documentation

## 2013-10-21 DIAGNOSIS — IMO0002 Reserved for concepts with insufficient information to code with codable children: Secondary | ICD-10-CM | POA: Insufficient documentation

## 2013-10-21 DIAGNOSIS — E1142 Type 2 diabetes mellitus with diabetic polyneuropathy: Secondary | ICD-10-CM | POA: Insufficient documentation

## 2013-10-21 DIAGNOSIS — W1809XA Striking against other object with subsequent fall, initial encounter: Secondary | ICD-10-CM | POA: Insufficient documentation

## 2013-10-21 DIAGNOSIS — Z8709 Personal history of other diseases of the respiratory system: Secondary | ICD-10-CM | POA: Insufficient documentation

## 2013-10-21 DIAGNOSIS — Z8701 Personal history of pneumonia (recurrent): Secondary | ICD-10-CM | POA: Insufficient documentation

## 2013-10-21 DIAGNOSIS — F17200 Nicotine dependence, unspecified, uncomplicated: Secondary | ICD-10-CM | POA: Insufficient documentation

## 2013-10-21 DIAGNOSIS — Z8614 Personal history of Methicillin resistant Staphylococcus aureus infection: Secondary | ICD-10-CM | POA: Insufficient documentation

## 2013-10-21 DIAGNOSIS — S0003XA Contusion of scalp, initial encounter: Secondary | ICD-10-CM | POA: Insufficient documentation

## 2013-10-21 DIAGNOSIS — R011 Cardiac murmur, unspecified: Secondary | ICD-10-CM | POA: Insufficient documentation

## 2013-10-21 DIAGNOSIS — F329 Major depressive disorder, single episode, unspecified: Secondary | ICD-10-CM | POA: Insufficient documentation

## 2013-10-21 DIAGNOSIS — Z792 Long term (current) use of antibiotics: Secondary | ICD-10-CM | POA: Insufficient documentation

## 2013-10-21 DIAGNOSIS — I10 Essential (primary) hypertension: Secondary | ICD-10-CM | POA: Insufficient documentation

## 2013-10-21 DIAGNOSIS — Z79899 Other long term (current) drug therapy: Secondary | ICD-10-CM | POA: Insufficient documentation

## 2013-10-21 DIAGNOSIS — Z87442 Personal history of urinary calculi: Secondary | ICD-10-CM | POA: Insufficient documentation

## 2013-10-21 DIAGNOSIS — M069 Rheumatoid arthritis, unspecified: Secondary | ICD-10-CM | POA: Insufficient documentation

## 2013-10-21 DIAGNOSIS — K219 Gastro-esophageal reflux disease without esophagitis: Secondary | ICD-10-CM | POA: Insufficient documentation

## 2013-10-21 DIAGNOSIS — E785 Hyperlipidemia, unspecified: Secondary | ICD-10-CM | POA: Insufficient documentation

## 2013-10-21 DIAGNOSIS — S0093XA Contusion of unspecified part of head, initial encounter: Secondary | ICD-10-CM

## 2013-10-21 DIAGNOSIS — S1093XA Contusion of unspecified part of neck, initial encounter: Principal | ICD-10-CM

## 2013-10-21 DIAGNOSIS — E039 Hypothyroidism, unspecified: Secondary | ICD-10-CM | POA: Insufficient documentation

## 2013-10-21 DIAGNOSIS — Y929 Unspecified place or not applicable: Secondary | ICD-10-CM | POA: Insufficient documentation

## 2013-10-21 DIAGNOSIS — Z8619 Personal history of other infectious and parasitic diseases: Secondary | ICD-10-CM | POA: Insufficient documentation

## 2013-10-21 DIAGNOSIS — I509 Heart failure, unspecified: Secondary | ICD-10-CM | POA: Insufficient documentation

## 2013-10-21 DIAGNOSIS — Y939 Activity, unspecified: Secondary | ICD-10-CM | POA: Insufficient documentation

## 2013-10-21 DIAGNOSIS — Z872 Personal history of diseases of the skin and subcutaneous tissue: Secondary | ICD-10-CM | POA: Insufficient documentation

## 2013-10-21 DIAGNOSIS — F3289 Other specified depressive episodes: Secondary | ICD-10-CM | POA: Insufficient documentation

## 2013-10-21 MED ORDER — HYDROCODONE-ACETAMINOPHEN 5-325 MG PO TABS
2.0000 | ORAL_TABLET | Freq: Once | ORAL | Status: AC
  Administered 2013-10-21: 2 via ORAL
  Filled 2013-10-21: qty 2

## 2013-10-21 NOTE — Discharge Instructions (Signed)
Fall Prevention and Home Safety °Falls cause injuries and can affect all age groups. It is possible to use preventive measures to significantly decrease the likelihood of falls. There are many simple measures which can make your home safer and prevent falls. °OUTDOORS °· Repair cracks and edges of walkways and driveways. °· Remove high doorway thresholds. °· Trim shrubbery on the main path into your home. °· Have good outside lighting. °· Clear walkways of tools, rocks, debris, and clutter. °· Check that handrails are not broken and are securely fastened. Both sides of steps should have handrails. °· Have leaves, snow, and ice cleared regularly. °· Use sand or salt on walkways during winter months. °· In the garage, clean up grease or oil spills. °BATHROOM °· Install night lights. °· Install grab bars by the toilet and in the tub and shower. °· Use non-skid mats or decals in the tub or shower. °· Place a plastic non-slip stool in the shower to sit on, if needed. °· Keep floors dry and clean up all water on the floor immediately. °· Remove soap buildup in the tub or shower on a regular basis. °· Secure bath mats with non-slip, double-sided rug tape. °· Remove throw rugs and tripping hazards from the floors. °BEDROOMS °· Install night lights. °· Make sure a bedside light is easy to reach. °· Do not use oversized bedding. °· Keep a telephone by your bedside. °· Have a firm chair with side arms to use for getting dressed. °· Remove throw rugs and tripping hazards from the floor. °KITCHEN °· Keep handles on pots and pans turned toward the center of the stove. Use back burners when possible. °· Clean up spills quickly and allow time for drying. °· Avoid walking on wet floors. °· Avoid hot utensils and knives. °· Position shelves so they are not too high or low. °· Place commonly used objects within easy reach. °· If necessary, use a sturdy step stool with a grab bar when reaching. °· Keep electrical cables out of the  way. °· Do not use floor polish or wax that makes floors slippery. If you must use wax, use non-skid floor wax. °· Remove throw rugs and tripping hazards from the floor. °STAIRWAYS °· Never leave objects on stairs. °· Place handrails on both sides of stairways and use them. Fix any loose handrails. Make sure handrails on both sides of the stairways are as long as the stairs. °· Check carpeting to make sure it is firmly attached along stairs. Make repairs to worn or loose carpet promptly. °· Avoid placing throw rugs at the top or bottom of stairways, or properly secure the rug with carpet tape to prevent slippage. Get rid of throw rugs, if possible. °· Have an electrician put in a light switch at the top and bottom of the stairs. °OTHER FALL PREVENTION TIPS °· Wear low-heel or rubber-soled shoes that are supportive and fit well. Wear closed toe shoes. °· When using a stepladder, make sure it is fully opened and both spreaders are firmly locked. Do not climb a closed stepladder. °· Add color or contrast paint or tape to grab bars and handrails in your home. Place contrasting color strips on first and last steps. °· Learn and use mobility aids as needed. Install an electrical emergency response system. °· Turn on lights to avoid dark areas. Replace light bulbs that burn out immediately. Get light switches that glow. °· Arrange furniture to create clear pathways. Keep furniture in the same place. °·   Firmly attach carpet with non-skid or double-sided tape. °· Eliminate uneven floor surfaces. °· Select a carpet pattern that does not visually hide the edge of steps. °· Be aware of all pets. °OTHER HOME SAFETY TIPS °· Set the water temperature for 120° F (48.8° C). °· Keep emergency numbers on or near the telephone. °· Keep smoke detectors on every level of the home and near sleeping areas. °Document Released: 07/28/2002 Document Revised: 02/06/2012 Document Reviewed: 10/27/2011 °ExitCare® Patient Information ©2014  [employer]. ° °Head Injury, Adult °You have received a head injury. It does not appear serious at this time. Headaches and vomiting are common following head injury. It should be easy to awaken from sleeping. Sometimes it is necessary for you to stay in the emergency department for a while for observation. Sometimes admission to the hospital may be needed. After injuries such as yours, most problems occur within the first 24 hours, but side effects may occur up to 7 10 days after the injury. It is important for you to carefully monitor your condition and contact your health care provider or seek immediate medical care if there is a change in your condition. °WHAT ARE THE TYPES OF HEAD INJURIES? °Head injuries can be as minor as a bump. Some head injuries can be more severe. More severe head injuries include: °· A jarring injury to the brain (concussion). °· A bruise of the brain (contusion). This mean there is bleeding in the brain that can cause swelling. °· A cracked skull (skull fracture). °· Bleeding in the brain that collects, clots, and forms a bump (hematoma). °WHAT CAUSES A HEAD INJURY? °A serious head injury is most likely to happen to someone who is in a car wreck and is not wearing a seat belt. Other causes of major head injuries include bicycle or motorcycle accidents, sports injuries, and falls. °HOW ARE HEAD INJURIES DIAGNOSED? °A complete history of the event leading to the injury and your current symptoms will be helpful in diagnosing head injuries. Many times, pictures of the brain, such as CT or MRI are needed to see the extent of the injury. Often, an overnight hospital stay is necessary for observation.  °WHEN SHOULD I SEEK IMMEDIATE MEDICAL CARE?  °You should get help right away if: °· You have confusion or drowsiness. °· You feel sick to your stomach (nauseous) or have continued, forceful vomiting. °· You have dizziness or unsteadiness that is getting worse. °· You have severe, continued  headaches not relieved by medicine. Only take over-the-counter or prescription medicines for pain, fever, or discomfort as directed by your health care provider. °· You do not have normal function of the arms or legs or are unable to walk. °· You notice changes in the black spots in the center of the colored part of your eye (pupil). °· You have a clear or bloody fluid coming from your nose or ears. °· You have a loss of vision. °During the next 24 hours after the injury, you must stay with someone who can watch you for the warning signs. This person should contact local emergency services (911 in the [place] if you have seizures, you become unconscious, or you are unable to wake up. °HOW CAN I PREVENT A HEAD INJURY IN THE FUTURE? °The most important factor for preventing major head injuries is avoiding motor vehicle accidents.  To minimize the potential for damage to your head, it is crucial to wear seat belts while riding in motor vehicles. Wearing helmets while bike riding   and playing collision sports (like football) is also helpful. Also, avoiding dangerous activities around the house will further help reduce your risk of head injury.  °WHEN CAN I RETURN TO NORMAL ACTIVITIES AND ATHLETICS? °You should be reevaluated by your health care provider before returning to these activities. If you have any of the following symptoms, you should not return to activities or contact sports until 1 week after the symptoms have stopped: °· Persistent headache. °· Dizziness or vertigo. °· Poor attention and concentration. °· Confusion. °· Memory problems. °· Nausea or vomiting. °· Fatigue or tire easily. °· Irritability. °· Intolerant of bright lights or loud noises. °· Anxiety or depression. °· Disturbed sleep. °MAKE SURE YOU:  °· Understand these instructions. °· Will watch your condition. °· Will get help right away if you are not doing well or get worse. °Document Released: 08/07/2005 Document Revised: 05/28/2013 Document  Reviewed: 04/14/2013 °ExitCare® Patient Information ©2014 [employer]. ° °

## 2013-10-21 NOTE — ED Provider Notes (Signed)
CSN: [id number]     Arrival date & time 10/21/13  1632 History   First MD Initiated Contact with Patient 10/21/13 1850     Chief Complaint  Patient presents with  . Fall     (Consider location/radiation/quality/duration/timing/severity/associated sxs/prior Treatment) HPI  Patient to the ER with complaints of fall with head contusion that she sustained 4 days ago. She has a BTK amputation and a cam walker boot on her left foot that is too large for a diabetic ulcer, pt is also drinking [facility] in exam room. She slipped on the cam walker boot down 3 stairs and hit her head. It did bleed. She denies LOC. She was seen at the Urgent Care yesterday and she report an xray was done. THE UC Doc recommended she go the ER for CT scan but she did n't want to wait in the ER yesterday so she came this evening for a CT scan. No neurological deficits, nausea, vomiting, diarrhea, weakness, confusion. She continues to have mild headaches.  Past Medical History  Diagnosis Date  . Diabetic neuropathy   . Cellulitis   . Thyroid disease   . Hyperlipidemia   . Heart murmur   . Neuromuscular disorder   . Concussion as a teenager    slight  . Diarrhea     pt takes Reglan tid  . Hypothyroidism     takes Synthroid daily  . Depression     takes CYmbalta daily  . Peripheral edema     takes Lasix daily  . MRSA (methicillin resistant staph aureus) culture positive     hx of 2012  . Staph infection 2007  . Complication of anesthesia 01/2013    "didn't know where I was; who I was; talking out the top of my head" (2013/05/12)  . Hypertension   . Peripheral vascular disease   . CHF (congestive heart failure)   . Pneumonia 1980's    "hospitalized w/it" (May 12, 2013)  . Chronic bronchitis   . Orthopnea     "just one; yesterday morning" (05-12-2013)  . Type II diabetes mellitus     Novolog,Levemir,and Lovaza daily  . GERD (gastroesophageal reflux disease)   . Daily headache   . Rheumatoid arthritis     "both  knees" (05/12/13)  . Anxiety   . Kidney stone     "passed on before" (2013/05/12)   Past Surgical History  Procedure Laterality Date  . Amputation    . Leg amputation below knee Right 2012  . Toe amputation Left ?2011     only 2 toes remaning.   . Tubal ligation  1990's  . Foot surgery Right 2012    "put pins in one year; amputated toes another OR" (12-May-2013)  . Wrist surgery Right 1980's    "pinched nerve" (2013-05-12)  . Dilation and curettage of uterus  1980's  . Refractive surgery Bilateral 2014  . Incision and drainage abscess N/A 01/24/2013    Procedure: INCISION AND DRAINAGE ABSCESS;  Surgeon: Clovis Pu. Cornett, MD;  Location: [facility] OR;  Service: General;  Laterality: N/A;  . Inguinal hernia repair N/A 01/28/2013    Procedure: I&D Right Groin Wound;  Surgeon: Shelly Rubenstein, MD;  Location: [facility] OR;  Service: General;  Laterality: N/A;  . Ganglion cyst excision Left 1990's    "wrist" (May 12, 2013)  . Bladder surgery  1970's    "stretched the mouth of my bladder" (2013/05/12)  . I&d extremity Left 08/25/2013    Procedure: IRRIGATION AND DEBRIDEMENT EXTREMITY;  Surgeon: Axel Filler, MD;  Location: [facility];  Service: General;  Laterality: Left;   Family History  Problem Relation Age of Onset  . Hypertension Mother   . Hyperlipidemia Mother   . Hypertension Brother   . Hyperlipidemia Brother   . Cancer Maternal Aunt   . Diabetes Paternal Aunt   . Diabetes Paternal Uncle   . Diabetes Paternal Grandmother   . Anesthesia problems Neg Hx   . Hypotension Neg Hx   . Malignant hyperthermia Neg Hx   . Pseudochol deficiency Neg Hx    History  Substance Use Topics  . Smoking status: Current Every Day Smoker -- 0.50 packs/day for 33 years    Types: Cigarettes  . Smokeless tobacco: Never Used  . Alcohol Use: No   OB History   Grav Para Term Preterm Abortions TAB SAB Ect Mult Living                 Review of Systems  The patient denies anorexia, fever, weight loss,, vision loss,  decreased hearing, hoarseness, chest pain, syncope, dyspnea on exertion, peripheral edema, balance deficits, hemoptysis, abdominal pain, melena, hematochezia, severe indigestion/heartburn, hematuria, incontinence, genital sores, muscle weakness, suspicious skin lesions, transient blindness, difficulty walking, depression, unusual weight change, abnormal bleeding, enlarged lymph nodes, angioedema, and breast masses.   Allergies  Gadolinium; Ivp dye; Metformin; and Penicillins  Home Medications   Current Outpatient Rx  Name  Route  Sig  Dispense  Refill  . albuterol (PROVENTIL HFA;VENTOLIN HFA) 108 (90 BASE) MCG/ACT inhaler   Inhalation   Inhale 2 puffs into the lungs every 6 (six) hours as needed for shortness of breath.   1 Inhaler   2   . amLODipine (NORVASC) 10 MG tablet   Oral   Take 1 tablet (10 mg total) by mouth daily.   30 tablet   0   . aspirin EC 81 MG tablet   Oral   Take 81 mg by mouth daily.         Marland Kitchen atorvastatin (LIPITOR) 80 MG tablet   Oral   Take 80 mg by mouth daily.         Marland Kitchen doxycycline (VIBRAMYCIN) 50 MG capsule   Oral   Take 2 capsules (100 mg total) by mouth 2 (two) times daily.   40 capsule   0   . feeding supplement, GLUCERNA SHAKE, (GLUCERNA SHAKE) LIQD   Oral   Take 237 mLs by mouth daily.   30 Can   0   . FLUoxetine (PROZAC) 20 MG tablet   Oral   Take 20 mg by mouth 2 (two) times daily.          . Fluticasone Furoate-Vilanterol (BREO ELLIPTA) 100-25 MCG/INH AEPB   Inhalation   Inhale 1 puff into the lungs daily.         . furosemide (LASIX) 40 MG tablet   Oral   Take 1 tablet (40 mg total) by mouth daily.   30 tablet   0   . gabapentin (NEURONTIN) 100 MG capsule   Oral   Take 1 capsule (100 mg total) by mouth 2 (two) times daily.   60 capsule   0   . HYDROcodone-acetaminophen (NORCO) 10-325 MG per tablet   Oral   Take 1 tablet by mouth every 6 (six) hours as needed.   20 tablet   0   . insulin aspart (NOVOLOG) 100  UNIT/ML injection   Subcutaneous   Inject 36-48 Units into the  skin 3 (three) times daily with meals. *per sliding scale         . insulin detemir (LEVEMIR) 100 UNIT/ML injection   Subcutaneous   Inject 140 Units into the skin every morning. *uses 70 units on each side         . levothyroxine (SYNTHROID, LEVOTHROID) 25 MCG tablet   Oral   Take 25 mcg by mouth every morning.          . metoCLOPramide (REGLAN) 5 MG tablet   Oral   Take 5 mg by mouth 3 (three) times daily.          Marland Kitchen omeprazole (PRILOSEC) 20 MG capsule   Oral   Take 20 mg by mouth daily.         Marland Kitchen oxybutynin (DITROPAN) 5 MG tablet   Oral   Take 5 mg by mouth every morning.           Marland Kitchen oxyCODONE (OXY IR/ROXICODONE) 5 MG immediate release tablet   Oral   Take 1-2 tablets (5-10 mg total) by mouth every 4 (four) hours as needed for moderate pain.   30 tablet   0   . sulfamethoxazole-trimethoprim (BACTRIM DS) 800-160 MG per tablet   Oral   Take 1 tablet by mouth 2 (two) times daily.         Marland Kitchen tiotropium (SPIRIVA) 18 MCG inhalation capsule   Inhalation   Place 18 mcg into inhaler and inhale daily.          BP 136/47  Pulse 53  Temp(Src) 98.3 F (36.8 C) (Oral)  Resp 18  SpO2 92%  LMP 10/05/2013 Physical Exam  Nursing note and vitals reviewed. Constitutional: She is oriented to person, place, and time. She appears well-developed and well-nourished. No distress.  HENT:  Head: Normocephalic. Head is with abrasion and with contusion. Head is without raccoon's eyes, without Battle's sign, without laceration, without right periorbital erythema and without left periorbital erythema. Hair is normal.    Eyes: Pupils are equal, round, and reactive to light.  Neck: Normal range of motion. Neck supple.  Cardiovascular: Normal rate and regular rhythm.   Pulmonary/Chest: Effort normal.  Abdominal: Soft.  Neurological: She is alert and oriented to person, place, and time. She has normal strength. No  cranial nerve deficit or sensory deficit.  Skin: Skin is warm and dry.    ED Course  Procedures (including critical care time) Labs Review Labs Reviewed - No data to display Imaging Review Dg Cervical Spine Complete  10/20/2013   CLINICAL DATA:  Fall and hit back of head.  Posterior head pain.  EXAM: CERVICAL SPINE  4+ VIEWS  COMPARISON:  Cervical radiographs dated 08/01/2013  FINDINGS: Alignment of the cervical spine is normal. Degenerative facet changes at C2-C3. Prevertebral soft tissues are normal. Again noted is right neural foraminal narrowing at C3-C4 and C4-C5. The right foraminal narrowing has progressed. Mild left foraminal narrowing at C3-C4. Alignment at the cervicothoracic junction is normal. Patient is edentulous.  IMPRESSION: Cervical spondylosis without acute bone abnormalities. Progression of the right neural foraminal narrowing.   Electronically Signed   By: Richarda Overlie M.D.   On: 10/20/2013 14:17   Ct Head Wo Contrast  10/21/2013   CLINICAL DATA:  Fall with injury to the back of the head and some scalp bleeding. Headaches.  EXAM: CT HEAD WITHOUT CONTRAST  TECHNIQUE: Contiguous axial images were obtained from the base of the skull through the vertex without intravenous contrast.  COMPARISON:  CT HEAD W/O CM dated 08/20/2013; MR HEAD W/O CM dated 05/03/2011; MR C SPINE W/O CM dated 11/01/2003; CT HEAD W/O CM dated 10/06/2011  FINDINGS: The brainstem, cerebellum, cerebral peduncles, thalamus, basal ganglia, basilar cisterns, and ventricular system appear within normal limits. No intracranial hemorrhage, mass lesion, or acute CVA. Polypoid mucoperiosteal thickening noted in both maxillary sinuses.  IMPRESSION: 1. Chronic bilateral maxillary sinusitis. Otherwise, no significant abnormalities are observed.   Electronically Signed   By: Herbie Baltimore M.D.   On: 10/21/2013 17:51     EKG Interpretation None      MDM   Final diagnoses:  Fall  Head contusion    Normal head CT. Head  contusion happened 4 days ago, I do not feel that an MRI is necessary for further evaluation at this time.  No neurological deficits. NO confusion. Mild headache. Dr. Lorenz Coaster Rx Vicodin for her.  Pt advised to f/u with her PCP.  53 y.o.Azoria A Konz's evaluation in the Emergency Department is complete. It has been determined that no acute conditions requiring further emergency intervention are present at this time. The patient/guardian have been advised of the diagnosis and plan. We have discussed signs and symptoms that warrant return to the ED, such as changes or worsening in symptoms.  Vital signs are stable at discharge. Filed Vitals:   10/21/13 1928  BP: 136/47  Pulse: 53  Temp:   Resp: 18    Patient/guardian has voiced understanding and agreed to follow-up with the PCP or specialist.     Dorthula Matas, PA-C 10/21/13 2004

## 2013-10-21 NOTE — ED Notes (Signed)
Pt A&Ox4 at discharge, verbalizing no complaints at this time. This RN wheeled pt out of ER and into her friends car.

## 2013-10-21 NOTE — ED Provider Notes (Signed)
Medical screening examination/treatment/procedure(s) were performed by non-physician practitioner and as supervising physician I was immediately available for consultation/collaboration.  Oseias Horsey L Demontrez Rindfleisch, MD 10/21/13 2032 

## 2013-10-21 NOTE — ED Notes (Signed)
Pt reports she fell on Friday night down 3 steps, sts she fell because her boot was too big and it caused her to "swirl around and fall", reports she fell backwards and hit the left back of head, bleed a little bit but then stopped. Denies LOC/use of blood thinners. Pt c/o pain to back of head, has been having headaches since then. Denies blurred vision, vomiting. Went to urgent care yesterday and had an xray done, was told there was a bad bruise and she should come to ED to have a CT scan. Nad, skin warm and dry, resp e/u.

## 2013-11-10 ENCOUNTER — Encounter (HOSPITAL_COMMUNITY): Payer: Self-pay | Admitting: Emergency Medicine

## 2013-11-10 ENCOUNTER — Emergency Department (HOSPITAL_COMMUNITY)
Admission: EM | Admit: 2013-11-10 | Discharge: 2013-11-10 | Disposition: A | Discharge status: Home / Self Care | Payer: Medicare HMO | Attending: Emergency Medicine | Admitting: Emergency Medicine

## 2013-11-10 DIAGNOSIS — F17200 Nicotine dependence, unspecified, uncomplicated: Secondary | ICD-10-CM | POA: Insufficient documentation

## 2013-11-10 DIAGNOSIS — E1142 Type 2 diabetes mellitus with diabetic polyneuropathy: Secondary | ICD-10-CM | POA: Insufficient documentation

## 2013-11-10 DIAGNOSIS — Z888 Allergy status to other drugs, medicaments and biological substances status: Secondary | ICD-10-CM | POA: Insufficient documentation

## 2013-11-10 DIAGNOSIS — Z794 Long term (current) use of insulin: Secondary | ICD-10-CM | POA: Insufficient documentation

## 2013-11-10 DIAGNOSIS — R609 Edema, unspecified: Secondary | ICD-10-CM | POA: Insufficient documentation

## 2013-11-10 DIAGNOSIS — I509 Heart failure, unspecified: Secondary | ICD-10-CM | POA: Insufficient documentation

## 2013-11-10 DIAGNOSIS — L02212 Cutaneous abscess of back [any part, except buttock]: Secondary | ICD-10-CM

## 2013-11-10 DIAGNOSIS — J189 Pneumonia, unspecified organism: Secondary | ICD-10-CM | POA: Insufficient documentation

## 2013-11-10 DIAGNOSIS — L03319 Cellulitis of trunk, unspecified: Principal | ICD-10-CM

## 2013-11-10 DIAGNOSIS — Z87442 Personal history of urinary calculi: Secondary | ICD-10-CM | POA: Insufficient documentation

## 2013-11-10 DIAGNOSIS — R0601 Orthopnea: Secondary | ICD-10-CM | POA: Insufficient documentation

## 2013-11-10 DIAGNOSIS — K219 Gastro-esophageal reflux disease without esophagitis: Secondary | ICD-10-CM | POA: Insufficient documentation

## 2013-11-10 DIAGNOSIS — R197 Diarrhea, unspecified: Secondary | ICD-10-CM | POA: Insufficient documentation

## 2013-11-10 DIAGNOSIS — F329 Major depressive disorder, single episode, unspecified: Secondary | ICD-10-CM | POA: Insufficient documentation

## 2013-11-10 DIAGNOSIS — I739 Peripheral vascular disease, unspecified: Secondary | ICD-10-CM | POA: Insufficient documentation

## 2013-11-10 DIAGNOSIS — G709 Myoneural disorder, unspecified: Secondary | ICD-10-CM | POA: Insufficient documentation

## 2013-11-10 DIAGNOSIS — F411 Generalized anxiety disorder: Secondary | ICD-10-CM | POA: Insufficient documentation

## 2013-11-10 DIAGNOSIS — Z79899 Other long term (current) drug therapy: Secondary | ICD-10-CM | POA: Insufficient documentation

## 2013-11-10 DIAGNOSIS — Z7982 Long term (current) use of aspirin: Secondary | ICD-10-CM | POA: Insufficient documentation

## 2013-11-10 DIAGNOSIS — E1149 Type 2 diabetes mellitus with other diabetic neurological complication: Secondary | ICD-10-CM | POA: Insufficient documentation

## 2013-11-10 DIAGNOSIS — Z8614 Personal history of Methicillin resistant Staphylococcus aureus infection: Secondary | ICD-10-CM | POA: Insufficient documentation

## 2013-11-10 DIAGNOSIS — E039 Hypothyroidism, unspecified: Secondary | ICD-10-CM | POA: Insufficient documentation

## 2013-11-10 DIAGNOSIS — Z880 Allergy status to penicillin: Secondary | ICD-10-CM | POA: Insufficient documentation

## 2013-11-10 DIAGNOSIS — R51 Headache: Secondary | ICD-10-CM | POA: Insufficient documentation

## 2013-11-10 DIAGNOSIS — L02219 Cutaneous abscess of trunk, unspecified: Secondary | ICD-10-CM | POA: Insufficient documentation

## 2013-11-10 DIAGNOSIS — J42 Unspecified chronic bronchitis: Secondary | ICD-10-CM | POA: Insufficient documentation

## 2013-11-10 DIAGNOSIS — R011 Cardiac murmur, unspecified: Secondary | ICD-10-CM | POA: Insufficient documentation

## 2013-11-10 DIAGNOSIS — E785 Hyperlipidemia, unspecified: Secondary | ICD-10-CM | POA: Insufficient documentation

## 2013-11-10 DIAGNOSIS — Z8619 Personal history of other infectious and parasitic diseases: Secondary | ICD-10-CM | POA: Insufficient documentation

## 2013-11-10 DIAGNOSIS — E079 Disorder of thyroid, unspecified: Secondary | ICD-10-CM | POA: Insufficient documentation

## 2013-11-10 DIAGNOSIS — F3289 Other specified depressive episodes: Secondary | ICD-10-CM | POA: Insufficient documentation

## 2013-11-10 DIAGNOSIS — M069 Rheumatoid arthritis, unspecified: Secondary | ICD-10-CM | POA: Insufficient documentation

## 2013-11-10 DIAGNOSIS — Z91041 Radiographic dye allergy status: Secondary | ICD-10-CM | POA: Insufficient documentation

## 2013-11-10 DIAGNOSIS — I10 Essential (primary) hypertension: Secondary | ICD-10-CM | POA: Insufficient documentation

## 2013-11-10 MED ORDER — DOXYCYCLINE HYCLATE 100 MG PO CAPS: 100.0000 mg | ORAL_CAPSULE | Freq: Two times a day (BID) | ORAL | Status: DC

## 2013-11-10 MED ORDER — HYDROCODONE-ACETAMINOPHEN 5-325 MG PO TABS: 1.0000 | ORAL_TABLET | Freq: Four times a day (QID) | ORAL | Status: DC | PRN

## 2013-11-10 NOTE — ED Provider Notes (Signed)
CSN: [id number]     Arrival date & time 11/10/13  1930 History  This chart was scribed for non-physician practitioner Felicie Morn, NP working with Glynn Octave, MD by Valera Castle, ED scribe. This patient was seen in room TR10C/TR10C and the patient's care was started at 8:43 PM.   Chief Complaint  Patient presents with  . Abscess   (Consider location/radiation/quality/duration/timing/severity/associated sxs/prior Treatment) Patient is a 53 y.o. female presenting with abscess. The history is provided by the patient. No language interpreter was used.  Abscess Location:  Torso Torso abscess location:  Upper back Abscess quality: not draining   Duration:  3 days Chronicity:  Recurrent Associated symptoms: no fever    HPI Comments: DIALA WAXMAN is a 53 y.o. female who presents to the Emergency Department complaining of an abscess over her central, upper back, onset 3 days ago without drainage. She reports h/o similar abscess. She reports h/o DM and states her levels are not controlled very well due to eating habits. She reports taking her medication regularly. She denies fever, and any other associated symptoms. She reports h/o HTN, hypercholesterolemia, and COPD. She denies h/o renal problems.   PCP - [name], MD  Past Medical History  Diagnosis Date  . Diabetic neuropathy   . Cellulitis   . Thyroid disease   . Hyperlipidemia   . Heart murmur   . Neuromuscular disorder   . Concussion as a teenager    slight  . Diarrhea     pt takes Reglan tid  . Hypothyroidism     takes Synthroid daily  . Depression     takes CYmbalta daily  . Peripheral edema     takes Lasix daily  . MRSA (methicillin resistant staph aureus) culture positive     hx of 2012  . Staph infection 2007  . Complication of anesthesia 01/2013    "didn't know where I was; who I was; talking out the top of my head" (2013-05-09)  . Hypertension   . Peripheral vascular disease   . CHF (congestive heart  failure)   . Pneumonia 1980's    "hospitalized w/it" (May 09, 2013)  . Chronic bronchitis   . Orthopnea     "just one; yesterday morning" (05/09/2013)  . Type II diabetes mellitus     Novolog,Levemir,and Lovaza daily  . GERD (gastroesophageal reflux disease)   . Daily headache   . Rheumatoid arthritis     "both knees" (09-May-2013)  . Anxiety   . Kidney stone     "passed on before" (05-09-13)   Past Surgical History  Procedure Laterality Date  . Amputation    . Leg amputation below knee Right 2012  . Toe amputation Left ?2011     only 2 toes remaning.   . Tubal ligation  1990's  . Foot surgery Right 2012    "put pins in one year; amputated toes another OR" (May 09, 2013)  . Wrist surgery Right 1980's    "pinched nerve" (2013-05-09)  . Dilation and curettage of uterus  1980's  . Refractive surgery Bilateral 2014  . Incision and drainage abscess N/A 01/24/2013    Procedure: INCISION AND DRAINAGE ABSCESS;  Surgeon: Clovis Pu. Cornett, MD;  Location: [facility] OR;  Service: General;  Laterality: N/A;  . Inguinal hernia repair N/A 01/28/2013    Procedure: I&D Right Groin Wound;  Surgeon: Shelly Rubenstein, MD;  Location: [facility] OR;  Service: General;  Laterality: N/A;  . Ganglion cyst excision Left 1990's    "wrist" (  04/21/2013)  . Bladder surgery  1970's    "stretched the mouth of my bladder" (04/21/2013)  . I&d extremity Left 08/25/2013    Procedure: IRRIGATION AND DEBRIDEMENT EXTREMITY;  Surgeon: Axel Filler, MD;  Location: [facility] OR;  Service: General;  Laterality: Left;   Family History  Problem Relation Age of Onset  . Hypertension Mother   . Hyperlipidemia Mother   . Hypertension Brother   . Hyperlipidemia Brother   . Cancer Maternal Aunt   . Diabetes Paternal Aunt   . Diabetes Paternal Uncle   . Diabetes Paternal Grandmother   . Anesthesia problems Neg Hx   . Hypotension Neg Hx   . Malignant hyperthermia Neg Hx   . Pseudochol deficiency Neg Hx    History  Substance Use Topics  . Smoking  status: Current Every Day Smoker -- 0.50 packs/day for 33 years    Types: Cigarettes  . Smokeless tobacco: Never Used  . Alcohol Use: No   OB History   Grav Para Term Preterm Abortions TAB SAB Ect Mult Living                 Review of Systems  Constitutional: Negative for fever and chills.  Skin:       abscess over upper back, without drainage  All other systems reviewed and are negative.    Allergies  Gadolinium; Ivp dye; Metformin; and Penicillins  Home Medications   Current Outpatient Rx  Name  Route  Sig  Dispense  Refill  . albuterol (PROVENTIL HFA;VENTOLIN HFA) 108 (90 BASE) MCG/ACT inhaler   Inhalation   Inhale 2 puffs into the lungs every 6 (six) hours as needed for shortness of breath.   1 Inhaler   2   . amLODipine (NORVASC) 10 MG tablet   Oral   Take 1 tablet (10 mg total) by mouth daily.   30 tablet   0   . aspirin EC 81 MG tablet   Oral   Take 81 mg by mouth daily.         Marland Kitchen atorvastatin (LIPITOR) 80 MG tablet   Oral   Take 80 mg by mouth daily.         Marland Kitchen FLUoxetine (PROZAC) 20 MG tablet   Oral   Take 20 mg by mouth 2 (two) times daily.          . Fluticasone Furoate-Vilanterol (BREO ELLIPTA) 100-25 MCG/INH AEPB   Inhalation   Inhale 1 puff into the lungs daily.         . furosemide (LASIX) 20 MG tablet   Oral   Take 20 mg by mouth daily.         Marland Kitchen gabapentin (NEURONTIN) 100 MG capsule   Oral   Take 1 capsule (100 mg total) by mouth 2 (two) times daily.   60 capsule   0   . insulin aspart (NOVOLOG) 100 UNIT/ML injection   Subcutaneous   Inject 36-48 Units into the skin 3 (three) times daily with meals. *per sliding scale         . insulin detemir (LEVEMIR) 100 UNIT/ML injection   Subcutaneous   Inject 140 Units into the skin every morning. *uses 70 units on each side         . levothyroxine (SYNTHROID, LEVOTHROID) 25 MCG tablet   Oral   Take 25 mcg by mouth every morning.          . metoCLOPramide (REGLAN) 5 MG  tablet   Oral  Take 5 mg by mouth 3 (three) times daily.          Marland Kitchen omeprazole (PRILOSEC) 20 MG capsule   Oral   Take 20 mg by mouth daily.         Marland Kitchen oxybutynin (DITROPAN) 5 MG tablet   Oral   Take 5 mg by mouth every morning.           . tiotropium (SPIRIVA) 18 MCG inhalation capsule   Inhalation   Place 18 mcg into inhaler and inhale daily.         Marland Kitchen HYDROcodone-acetaminophen (NORCO) 10-325 MG per tablet   Oral   Take 1 tablet by mouth every 6 (six) hours as needed.   20 tablet   0    BP 132/44  Pulse 66  Temp(Src) 98 F (36.7 C) (Oral)  Resp 18  Ht 5\' 5"  (1.651 m)  Wt 219 lb (99.338 kg)  BMI 36.44 kg/m2  SpO2 95%  LMP 10/05/2013  Physical Exam  Nursing note and vitals reviewed. Constitutional: She is oriented to person, place, and time. She appears well-developed and well-nourished. No distress.  HENT:  Head: Normocephalic and atraumatic.  Eyes: EOM are normal.  Neck: Neck supple. No tracheal deviation present.  Cardiovascular: Normal rate, regular rhythm and normal heart sounds.   No murmur heard. Pulmonary/Chest: Effort normal and breath sounds normal. No respiratory distress. She has no wheezes. She has no rales.  Musculoskeletal: Normal range of motion.  Neurological: She is alert and oriented to person, place, and time.  Skin: Skin is warm and dry. There is erythema.  Quarter sized area of redness and induration over midline T-5.   Psychiatric: She has a normal mood and affect. Her behavior is normal.    ED Course  Procedures (including critical care time)  DIAGNOSTIC STUDIES: Oxygen Saturation is 95% on room air, normal by my interpretation.    COORDINATION OF CARE: 8:48 PM-Discussed treatment plan which includes I&D with pt at bedside and pt agreed to plan.   INCISION AND DRAINAGE PROCEDURE NOTE: Patient identification was confirmed and verbal consent was obtained. This procedure was performed by 10/07/2013, NP at 8:59 PM. Site: Mid,  upper back Sterile procedures observed: Saline Needle size: 27 Anesthetic used (type and amt): 2% Lidocaine without Epinephrine  Drainage: copious  Drainage initiated spontaneously while administering anesthetic.  Labs Review Labs Reviewed - No data to display Imaging Review No results found.   EKG Interpretation None     Medications - No data to display MDM   Final diagnoses:  None   Abscess of back.  History of diabetes, BKA right leg d/t diabetic complications.  Will cover with antibiotics.  I personally performed the services described in this documentation, which was scribed in my presence. The recorded information has been reviewed and is accurate.    Felicie Morn, NP 11/11/13 [phone]

## 2013-11-10 NOTE — Discharge Instructions (Signed)
Abscess An abscess is an infected area that contains a collection of pus and debris.It can occur in almost any part of the body. An abscess is also known as a furuncle or boil. CAUSES  An abscess occurs when tissue gets infected. This can occur from blockage of oil or sweat glands, infection of hair follicles, or a minor injury to the skin. As the body tries to fight the infection, pus collects in the area and creates pressure under the skin. This pressure causes pain. People with weakened immune systems have difficulty fighting infections and get certain abscesses more often.  SYMPTOMS Usually an abscess develops on the skin and becomes a painful mass that is red, warm, and tender. If the abscess forms under the skin, you may feel a moveable soft area under the skin. Some abscesses break open (rupture) on their own, but most will continue to get worse without care. The infection can spread deeper into the body and eventually into the bloodstream, causing you to feel ill.  DIAGNOSIS  Your caregiver will take your medical history and perform a physical exam. A sample of fluid may also be taken from the abscess to determine what is causing your infection. TREATMENT  Your caregiver may prescribe antibiotic medicines to fight the infection. However, taking antibiotics alone usually does not cure an abscess. Your caregiver may need to make a small cut (incision) in the abscess to drain the pus. In some cases, gauze is packed into the abscess to reduce pain and to continue draining the area. HOME CARE INSTRUCTIONS   Only take over-the-counter or prescription medicines for pain, discomfort, or fever as directed by your caregiver.  If you were prescribed antibiotics, take them as directed. Finish them even if you start to feel better.  If gauze is used, follow your caregiver's directions for changing the gauze.  To avoid spreading the infection:  Keep your draining abscess covered with a  bandage.  Wash your hands well.  Do not share personal care items, towels, or whirlpools with others.  Avoid skin contact with others.  Keep your skin and clothes clean around the abscess.  Keep all follow-up appointments as directed by your caregiver. SEEK MEDICAL CARE IF:   You have increased pain, swelling, redness, fluid drainage, or bleeding.  You have muscle aches, chills, or a general ill feeling.  You have a fever. MAKE SURE YOU:   Understand these instructions.  Will watch your condition.  Will get help right away if you are not doing well or get worse. Document Released: 05/17/2005 Document Revised: 02/06/2012 Document Reviewed: 10/20/2011 [employer] Patient Information 2014 [employer].  

## 2013-11-10 NOTE — ED Notes (Signed)
Raised area located on the MID.-Upper back. Pt states there have been other areas the same.

## 2013-11-10 NOTE — ED Notes (Signed)
Pt. reports abscess at upper back onset 3 days ago with no drainage .

## 2013-11-10 NOTE — ED Notes (Signed)
Smith, NP at bedside.  

## 2013-11-11 NOTE — ED Provider Notes (Signed)
Medical screening examination/treatment/procedure(s) were performed by non-physician practitioner and as supervising physician I was immediately available for consultation/collaboration.   EKG Interpretation None       Shantice Menger, MD 11/11/13 1014 

## 2013-11-24 ENCOUNTER — Encounter (HOSPITAL_COMMUNITY): Payer: Self-pay | Admitting: Emergency Medicine

## 2013-11-24 ENCOUNTER — Other Ambulatory Visit: Payer: Self-pay

## 2013-11-24 ENCOUNTER — Emergency Department (HOSPITAL_COMMUNITY)
Admission: EM | Admit: 2013-11-24 | Discharge: 2013-11-24 | Disposition: A | Discharge status: Home / Self Care | Payer: Medicare HMO | Attending: Emergency Medicine | Admitting: Emergency Medicine

## 2013-11-24 DIAGNOSIS — M069 Rheumatoid arthritis, unspecified: Secondary | ICD-10-CM | POA: Insufficient documentation

## 2013-11-24 DIAGNOSIS — Z8614 Personal history of Methicillin resistant Staphylococcus aureus infection: Secondary | ICD-10-CM | POA: Insufficient documentation

## 2013-11-24 DIAGNOSIS — F329 Major depressive disorder, single episode, unspecified: Secondary | ICD-10-CM | POA: Insufficient documentation

## 2013-11-24 DIAGNOSIS — Z8619 Personal history of other infectious and parasitic diseases: Secondary | ICD-10-CM | POA: Insufficient documentation

## 2013-11-24 DIAGNOSIS — E1149 Type 2 diabetes mellitus with other diabetic neurological complication: Secondary | ICD-10-CM | POA: Insufficient documentation

## 2013-11-24 DIAGNOSIS — R739 Hyperglycemia, unspecified: Secondary | ICD-10-CM

## 2013-11-24 DIAGNOSIS — F3289 Other specified depressive episodes: Secondary | ICD-10-CM | POA: Insufficient documentation

## 2013-11-24 DIAGNOSIS — Z880 Allergy status to penicillin: Secondary | ICD-10-CM | POA: Insufficient documentation

## 2013-11-24 DIAGNOSIS — Z7982 Long term (current) use of aspirin: Secondary | ICD-10-CM | POA: Insufficient documentation

## 2013-11-24 DIAGNOSIS — E785 Hyperlipidemia, unspecified: Secondary | ICD-10-CM | POA: Insufficient documentation

## 2013-11-24 DIAGNOSIS — R011 Cardiac murmur, unspecified: Secondary | ICD-10-CM | POA: Insufficient documentation

## 2013-11-24 DIAGNOSIS — E1142 Type 2 diabetes mellitus with diabetic polyneuropathy: Secondary | ICD-10-CM | POA: Insufficient documentation

## 2013-11-24 DIAGNOSIS — Z79899 Other long term (current) drug therapy: Secondary | ICD-10-CM | POA: Insufficient documentation

## 2013-11-24 DIAGNOSIS — Z86718 Personal history of other venous thrombosis and embolism: Secondary | ICD-10-CM | POA: Insufficient documentation

## 2013-11-24 DIAGNOSIS — Z8709 Personal history of other diseases of the respiratory system: Secondary | ICD-10-CM | POA: Insufficient documentation

## 2013-11-24 DIAGNOSIS — Z87442 Personal history of urinary calculi: Secondary | ICD-10-CM | POA: Insufficient documentation

## 2013-11-24 DIAGNOSIS — F17200 Nicotine dependence, unspecified, uncomplicated: Secondary | ICD-10-CM | POA: Insufficient documentation

## 2013-11-24 DIAGNOSIS — IMO0002 Reserved for concepts with insufficient information to code with codable children: Secondary | ICD-10-CM | POA: Insufficient documentation

## 2013-11-24 DIAGNOSIS — I10 Essential (primary) hypertension: Secondary | ICD-10-CM | POA: Insufficient documentation

## 2013-11-24 DIAGNOSIS — Z87828 Personal history of other (healed) physical injury and trauma: Secondary | ICD-10-CM | POA: Insufficient documentation

## 2013-11-24 DIAGNOSIS — I509 Heart failure, unspecified: Secondary | ICD-10-CM | POA: Insufficient documentation

## 2013-11-24 DIAGNOSIS — Z794 Long term (current) use of insulin: Secondary | ICD-10-CM | POA: Insufficient documentation

## 2013-11-24 DIAGNOSIS — F411 Generalized anxiety disorder: Secondary | ICD-10-CM | POA: Insufficient documentation

## 2013-11-24 DIAGNOSIS — E039 Hypothyroidism, unspecified: Secondary | ICD-10-CM | POA: Insufficient documentation

## 2013-11-24 LAB — COMPREHENSIVE METABOLIC PANEL
ALBUMIN: 2.9 g/dL — AB (ref 3.5–5.2)
ALK PHOS: 158 U/L — AB (ref 39–117)
ALT: 15 U/L (ref 0–35)
AST: 13 U/L (ref 0–37)
BUN: 47 mg/dL — ABNORMAL HIGH (ref 6–23)
CO2: 26 mEq/L (ref 19–32)
Calcium: 9.1 mg/dL (ref 8.4–10.5)
Chloride: 98 mEq/L (ref 96–112)
Creatinine, Ser: 1.54 mg/dL — ABNORMAL HIGH (ref 0.50–1.10)
GFR calc Af Amer: 44 mL/min — ABNORMAL LOW (ref >90)
GFR calc non Af Amer: 38 mL/min — ABNORMAL LOW (ref >90)
Glucose, Bld: 230 mg/dL — ABNORMAL HIGH (ref 70–99)
POTASSIUM: 5.4 meq/L — AB (ref 3.7–5.3)
Sodium: 139 mEq/L (ref 137–147)
Total Protein: 7.2 g/dL (ref 6.0–8.3)

## 2013-11-24 LAB — I-STAT VENOUS BLOOD GAS, ED
Acid-Base Excess: 3 mmol/L — ABNORMAL HIGH (ref 0.0–2.0)
Bicarbonate: 30.9 mEq/L — ABNORMAL HIGH (ref 20.0–24.0)
O2 Saturation: 74 %
PCO2 VEN: 59.3 mmHg — AB (ref 45.0–50.0)
PH VEN: 7.325 — AB (ref 7.250–7.300)
TCO2: 33 mmol/L (ref 0–100)
pO2, Ven: 44 mmHg (ref 30.0–45.0)

## 2013-11-24 LAB — CBG MONITORING, ED: GLUCOSE-CAPILLARY: 280 mg/dL — AB (ref 70–99)

## 2013-11-24 MED ORDER — SODIUM CHLORIDE 0.9 % IV BOLUS (SEPSIS)
1000.0000 mL | Freq: Once | INTRAVENOUS | Status: AC
  Administered 2013-11-24: 1000 mL via INTRAVENOUS

## 2013-11-24 NOTE — ED Provider Notes (Signed)
CSN: [id number]     Arrival date & time 11/24/13  1154 History   First MD Initiated Contact with Patient 11/24/13 1155     Chief Complaint  Patient presents with  . Hyperglycemia  . Second Degree Heart Block      (Consider location/radiation/quality/duration/timing/severity/associated sxs/prior Treatment) Patient is a 53 y.o. female presenting with hyperglycemia. The history is provided by the patient.  Hyperglycemia Blood sugar level PTA:  ~400s Severity:  Unable to specify (no sxs) Onset quality:  Unable to specify Timing:  Constant Progression:  Resolved Chronicity:  New Diabetes status:  Controlled with insulin (poorly controlled) Current diabetic therapy:  Insulin Context comment:  Pt states "I am always 300s-400s", this is nothing new for me Relieved by:  Insulin Associated symptoms: no abdominal pain, no altered mental status, no chest pain, no diaphoresis, no dysuria, no fever, no nausea, no shortness of breath, no syncope and no vomiting   Risk factors: family hx of diabetes and obesity     Past Medical History  Diagnosis Date  . Diabetic neuropathy   . Cellulitis   . Thyroid disease   . Hyperlipidemia   . Heart murmur   . Neuromuscular disorder   . Concussion as a teenager    slight  . Diarrhea     pt takes Reglan tid  . Hypothyroidism     takes Synthroid daily  . Depression     takes CYmbalta daily  . Peripheral edema     takes Lasix daily  . MRSA (methicillin resistant staph aureus) culture positive     hx of 2012  . Staph infection 2007  . Complication of anesthesia 01/2013    "didn't know where I was; who I was; talking out the top of my head" (2013/05/13)  . Hypertension   . Peripheral vascular disease   . CHF (congestive heart failure)   . Pneumonia 1980's    "hospitalized w/it" (May 13, 2013)  . Chronic bronchitis   . Orthopnea     "just one; yesterday morning" (13-May-2013)  . Type II diabetes mellitus     Novolog,Levemir,and Lovaza daily  . GERD  (gastroesophageal reflux disease)   . Daily headache   . Rheumatoid arthritis     "both knees" (May 13, 2013)  . Anxiety   . Kidney stone     "passed on before" (05-13-2013)   Past Surgical History  Procedure Laterality Date  . Amputation    . Leg amputation below knee Right 2012  . Toe amputation Left ?2011     only 2 toes remaning.   . Tubal ligation  1990's  . Foot surgery Right 2012    "put pins in one year; amputated toes another OR" (05/13/13)  . Wrist surgery Right 1980's    "pinched nerve" (2013/05/13)  . Dilation and curettage of uterus  1980's  . Refractive surgery Bilateral 2014  . Incision and drainage abscess N/A 01/24/2013    Procedure: INCISION AND DRAINAGE ABSCESS;  Surgeon: Clovis Pu. Cornett, MD;  Location: [facility] OR;  Service: General;  Laterality: N/A;  . Inguinal hernia repair N/A 01/28/2013    Procedure: I&D Right Groin Wound;  Surgeon: Shelly Rubenstein, MD;  Location: [facility] OR;  Service: General;  Laterality: N/A;  . Ganglion cyst excision Left 1990's    "wrist" (May 13, 2013)  . Bladder surgery  1970's    "stretched the mouth of my bladder" (05/13/2013)  . I&d extremity Left 08/25/2013    Procedure: IRRIGATION AND DEBRIDEMENT EXTREMITY;  Surgeon: Axel Filler,  MD;  Location: [facility] OR;  Service: General;  Laterality: Left;   Family History  Problem Relation Age of Onset  . Hypertension Mother   . Hyperlipidemia Mother   . Hypertension Brother   . Hyperlipidemia Brother   . Cancer Maternal Aunt   . Diabetes Paternal Aunt   . Diabetes Paternal Uncle   . Diabetes Paternal Grandmother   . Anesthesia problems Neg Hx   . Hypotension Neg Hx   . Malignant hyperthermia Neg Hx   . Pseudochol deficiency Neg Hx    History  Substance Use Topics  . Smoking status: Current Every Day Smoker -- 0.50 packs/day for 33 years    Types: Cigarettes  . Smokeless tobacco: Never Used  . Alcohol Use: No   OB History   Grav Para Term Preterm Abortions TAB SAB Ect Mult Living                  Review of Systems  Constitutional: Negative for fever, diaphoresis, activity change and appetite change.  HENT: Negative for congestion and rhinorrhea.   Eyes: Negative for discharge, redness and itching.  Respiratory: Negative for shortness of breath and wheezing.   Cardiovascular: Negative for chest pain, palpitations and syncope.  Gastrointestinal: Negative for nausea, vomiting, abdominal pain and diarrhea.  Genitourinary: Negative for dysuria and hematuria.  Skin: Negative for rash.  Neurological: Negative for syncope.      Allergies  Gadolinium; Ivp dye; Metformin; and Penicillins  Home Medications   Current Outpatient Rx  Name  Route  Sig  Dispense  Refill  . amLODipine (NORVASC) 10 MG tablet   Oral   Take 1 tablet (10 mg total) by mouth daily.   30 tablet   0   . aspirin EC 81 MG tablet   Oral   Take 81 mg by mouth daily.         Marland Kitchen atorvastatin (LIPITOR) 80 MG tablet   Oral   Take 80 mg by mouth daily.         Marland Kitchen FLUoxetine (PROZAC) 20 MG tablet   Oral   Take 20 mg by mouth 2 (two) times daily.          . Fluticasone Furoate-Vilanterol (BREO ELLIPTA) 100-25 MCG/INH AEPB   Inhalation   Inhale 1 puff into the lungs daily.         . furosemide (LASIX) 20 MG tablet   Oral   Take 20 mg by mouth daily.         Marland Kitchen gabapentin (NEURONTIN) 100 MG capsule   Oral   Take 1 capsule (100 mg total) by mouth 2 (two) times daily.   60 capsule   0   . HYDROcodone-acetaminophen (NORCO/VICODIN) 5-325 MG per tablet   Oral   Take 1 tablet by mouth every 6 (six) hours as needed for severe pain.   10 tablet   0   . insulin aspart (NOVOLOG) 100 UNIT/ML injection   Subcutaneous   Inject 36-48 Units into the skin 3 (three) times daily with meals. *per sliding scale         . insulin detemir (LEVEMIR) 100 UNIT/ML injection   Subcutaneous   Inject 140 Units into the skin every morning. *uses 70 units on each side         . levothyroxine (SYNTHROID,  LEVOTHROID) 25 MCG tablet   Oral   Take 25 mcg by mouth every morning.          . metoCLOPramide (REGLAN) 5  MG tablet   Oral   Take 5 mg by mouth 3 (three) times daily.          Marland Kitchen omeprazole (PRILOSEC) 20 MG capsule   Oral   Take 20 mg by mouth daily.         Marland Kitchen oxybutynin (DITROPAN) 5 MG tablet   Oral   Take 5 mg by mouth every morning.           . sulfamethoxazole-trimethoprim (BACTRIM DS) 800-160 MG per tablet   Oral   Take 1 tablet by mouth 3 (three) times daily. For 10 days         . tiotropium (SPIRIVA) 18 MCG inhalation capsule   Inhalation   Place 18 mcg into inhaler and inhale daily.         Marland Kitchen albuterol (PROVENTIL HFA;VENTOLIN HFA) 108 (90 BASE) MCG/ACT inhaler   Inhalation   Inhale 2 puffs into the lungs every 6 (six) hours as needed for shortness of breath.   1 Inhaler   2    BP 127/46  Pulse 50  Temp(Src) 97.6 F (36.4 C) (Oral)  Resp 18  Ht 5\' 5"  (1.651 m)  Wt 225 lb 9.6 oz (102.331 kg)  BMI 37.54 kg/m2  SpO2 93% Physical Exam  Constitutional: She is oriented to person, place, and time. She appears well-developed and well-nourished. No distress.  HENT:  Head: Normocephalic and atraumatic.  Mouth/Throat: Oropharynx is clear and moist.  Eyes: Conjunctivae and EOM are normal. Pupils are equal, round, and reactive to light. Right eye exhibits no discharge. Left eye exhibits no discharge. No scleral icterus.  Neck: Normal range of motion. Neck supple.  Cardiovascular: Intact distal pulses.  Exam reveals no gallop and no friction rub.   No murmur heard. Huston Foley, irreg  Pulmonary/Chest: Effort normal and breath sounds normal. No respiratory distress. She has no wheezes. She has no rales.  Abdominal: Soft. She exhibits no distension and no mass. There is no tenderness.  Musculoskeletal: Normal range of motion.  Neurological: She is alert and oriented to person, place, and time. No cranial nerve deficit. She exhibits normal muscle tone. Coordination  normal.  Skin: She is not diaphoretic.    ED Course  Procedures (including critical care time) Labs Review Labs Reviewed  COMPREHENSIVE METABOLIC PANEL - Abnormal; Notable for the following:    Potassium 5.4 (*)    Glucose, Bld 230 (*)    BUN 47 (*)    Creatinine, Ser 1.54 (*)    Albumin 2.9 (*)    Alkaline Phosphatase 158 (*)    Total Bilirubin <0.2 (*)    GFR calc non Af Amer 38 (*)    GFR calc Af Amer 44 (*)    All other components within normal limits  CBG MONITORING, ED - Abnormal; Notable for the following:    Glucose-Capillary 280 (*)    All other components within normal limits  I-STAT VENOUS BLOOD GAS, ED - Abnormal; Notable for the following:    pH, Ven 7.325 (*)    pCO2, Ven 59.3 (*)    Bicarbonate 30.9 (*)    Acid-Base Excess 3.0 (*)    All other components within normal limits  BLOOD GAS, VENOUS   Imaging Review No results found.   EKG Interpretation None      MDM   MDM: 53 y.o. Y.o. WF w/ PMHx of DM, HTN, CHF, hx of neuropathy w/ cc: of hyperglycemia. Pt lives at home, has home health three times a week,  her nurse recommended she come for her BG. Pt normally poorly controlled, baseline is 300-400s, and today was in the 400s. She states she felt fine, had no sxs, no chest pain, SOB, n/v/d, numbness, weakness, or HA. Nurse was concerned and recommend she come. En route, EMS told patient she had "2nd degree heart block" but denies any palpitations, chest pain, SOB, syncope. AFVSS, mildly bradycardic in 50s while in ED. Well appearing, NAD. BG here 280. EKG shows similar EKG when compared to multiple visits in the past year, with no new ischemic or arrythmic change. Bigeminy vs PAC. Pt labs show no DKA, likely dehydration from poorly controlled diabetes. Pt given 1 L IVF. Pt has follow up with PCP in 3 days and I reiterated the importance of keeping that appointment. Recommend return to ED if sxs change. Discharged. Care of case d/w my attending.  Final diagnoses:   Hyperglycemia    Discharged  Pilar Jarvis, MD 11/24/13 [phone]

## 2013-11-24 NOTE — ED Notes (Signed)
Provider at the bedside.  

## 2013-11-24 NOTE — ED Notes (Signed)
Per EMS, Caregiver reported fasted blood glucose of 432 at 0900. Caregiver 26 units of Novolog. CBG rechecked and reported 388. EMS called due to Blood Sugar  Not responding and Bradycardia. EMS assessed upon arrival and noted Second Degree Heart Block in pt's EKG. Pt Alert and Oriented x4. Patient reported Lasix has been changed this week from 40 mg to 80 mg and then MD returned medication back to 40 mg/day due to dehydration on Saturday. Vitals per EMS: 44 HR, 388 CBG, 146/86, 93% on RA. History of Smoking, HTN, CHF, and COPD.

## 2013-11-24 NOTE — ED Provider Notes (Signed)
Medical screening examination/treatment/procedure(s) were conducted as a shared visit with non-physician practitioner(s) or resident and myself. I personally evaluated the patient during the encounter and agree with the findings and plan unless otherwise indicated.  I have personally reviewed any xrays and/ or EKG's with the provider and I agree with interpretation.  Is with uncontrolled diabetes, neuropathy, diabetic ulcer left foot, lipids, high blood pressure presents with elevated glucose and bradycardia. Patient denies any new medicines only recent change was Lasix going from 80 mg to 40 mg. Caregiver send patient for further evaluation. Patient denies vomiting, cough, chest pain, belly pain and otherwise feels well. Patient on home O2 2 L nasal cannula. Patient normal glucoses are 3-400 range and she is in the 200s in the ER. BMP consistent with uncontrolled diabetes and dehydration. Plan for IV fluid bolus and close followup outpatient. Pt improved and has outpt fup.  EKG similar to previous, plan for fup outpt with cardiology, PAC vs bigeminy.  Date: 11/24/2013  Rate: 63  Rhythm: normal sinus rhythm  QRS Axis: normal  Intervals: normal  ST/T Wave abnormalities: nonspecific T wave changes  Conduction Disutrbances:PAC vs bigeminy  Narrative Interpretation:  Old EKG Reviewed: unchanged  Uncontrolled diabetes, dehydration   Enid Skeens, MD 11/24/13 1642

## 2013-11-24 NOTE — ED Notes (Signed)
Phlebotomy at the bedside  

## 2013-11-24 NOTE — ED Notes (Signed)
MD Zavitz at the bedside.  

## 2013-11-24 NOTE — Discharge Instructions (Signed)

## 2013-11-24 NOTE — ED Notes (Signed)
Patient is resting comfortably. 

## 2014-02-02 IMAGING — CR DG TIBIA/FIBULA 2V*R*
2 series · 2 of 2 positions shown · non-contrast
Comparison: None.

CLINICAL DATA: Status post below-the-knee amputation.  Nonhealing
wound.

RIGHT TIBIA AND FIBULA - 2 VIEW

[view not recorded (1 of 2)]
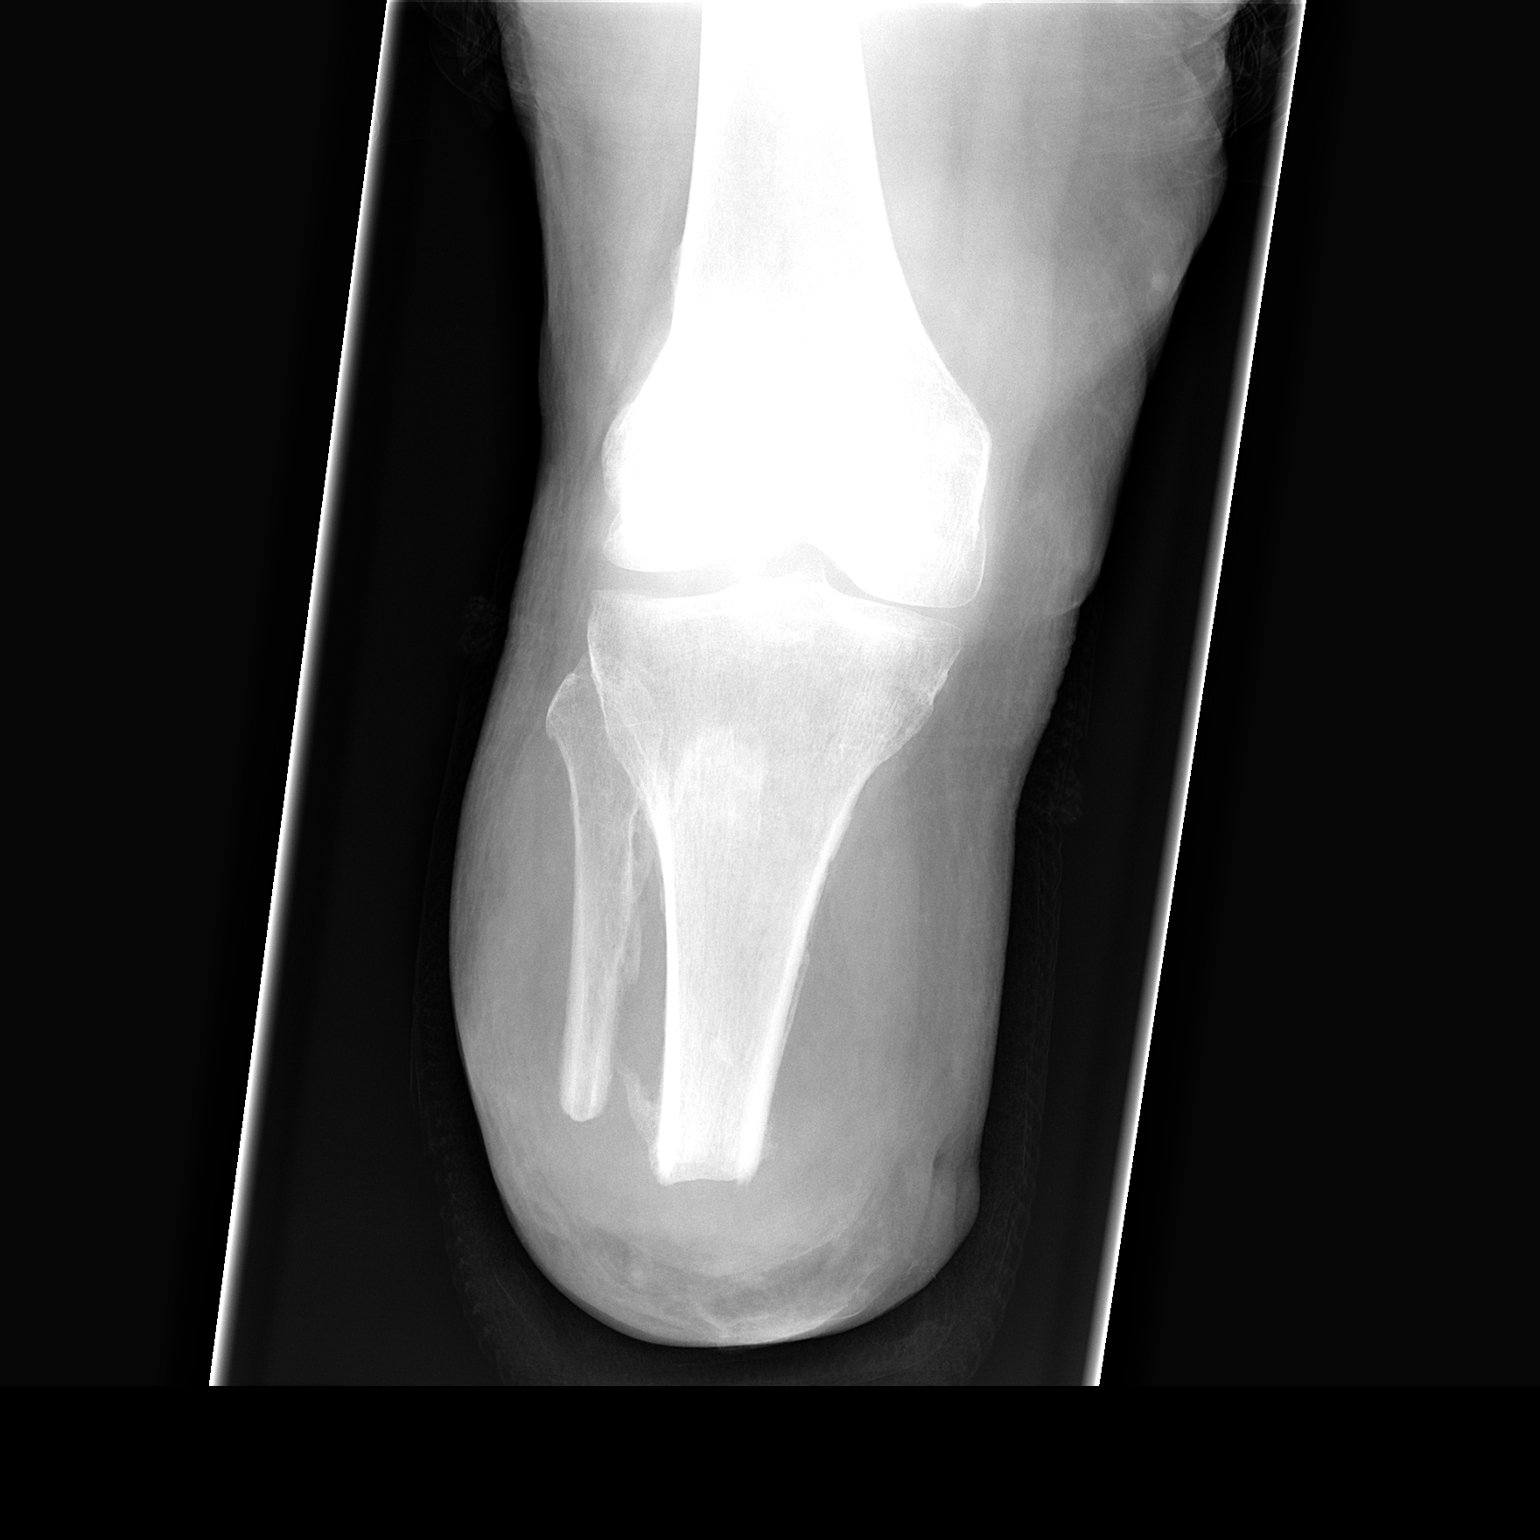

[view not recorded (2 of 2)]
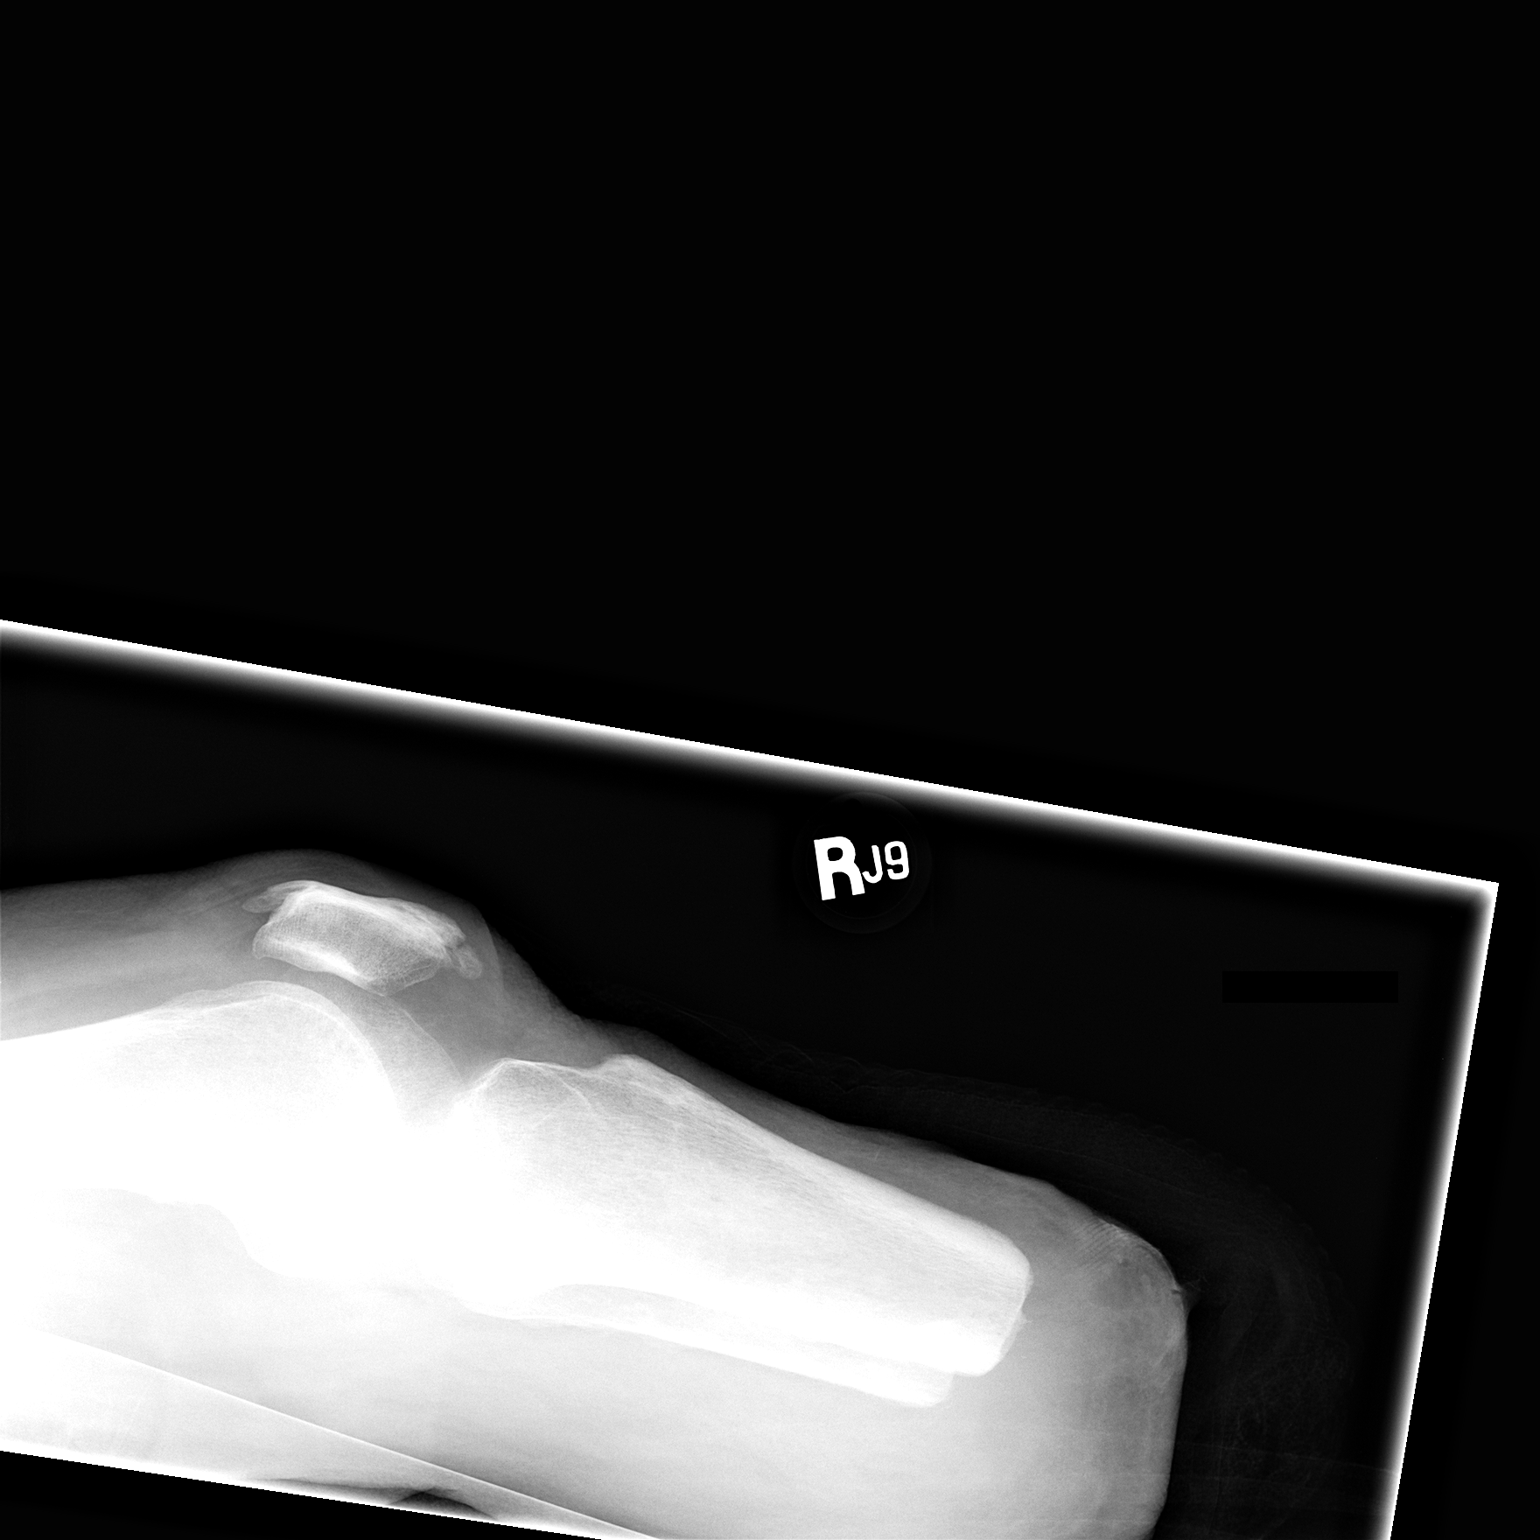

[2 of 2 positions shown; findings below may reference images not displayed]

FINDINGS: Postoperative change below-the-knee amputation is
identified.  There appears to be some irregularity in the
subcutaneous tissues along the anterior aspect of the patient's
stump.  No radiopaque foreign body is identified.  There is no bony
destructive change to suggest osteomyelitis.
IMPRESSION: Soft tissue wound along the anterior aspect of the patient's stump.
Negative for osteomyelitis.

## 2014-03-04 ENCOUNTER — Encounter (HOSPITAL_COMMUNITY): Payer: Self-pay | Admitting: Emergency Medicine

## 2014-03-04 ENCOUNTER — Inpatient Hospital Stay (HOSPITAL_COMMUNITY)
Admission: EM | Admit: 2014-03-04 | Discharge: 2014-03-07 | DRG: 243 | Disposition: A | Discharge status: Home Health | Payer: Medicare HMO | Attending: Cardiology | Admitting: Cardiology

## 2014-03-04 ENCOUNTER — Emergency Department (HOSPITAL_COMMUNITY): Payer: Medicare HMO

## 2014-03-04 DIAGNOSIS — Z9981 Dependence on supplemental oxygen: Secondary | ICD-10-CM | POA: Diagnosis not present

## 2014-03-04 DIAGNOSIS — I498 Other specified cardiac arrhythmias: Secondary | ICD-10-CM | POA: Diagnosis present

## 2014-03-04 DIAGNOSIS — M069 Rheumatoid arthritis, unspecified: Secondary | ICD-10-CM | POA: Diagnosis present

## 2014-03-04 DIAGNOSIS — E1165 Type 2 diabetes mellitus with hyperglycemia: Secondary | ICD-10-CM | POA: Diagnosis present

## 2014-03-04 DIAGNOSIS — F3289 Other specified depressive episodes: Secondary | ICD-10-CM | POA: Diagnosis present

## 2014-03-04 DIAGNOSIS — Z9989 Dependence on other enabling machines and devices: Secondary | ICD-10-CM

## 2014-03-04 DIAGNOSIS — L97509 Non-pressure chronic ulcer of other part of unspecified foot with unspecified severity: Secondary | ICD-10-CM | POA: Diagnosis present

## 2014-03-04 DIAGNOSIS — I509 Heart failure, unspecified: Secondary | ICD-10-CM | POA: Diagnosis present

## 2014-03-04 DIAGNOSIS — F329 Major depressive disorder, single episode, unspecified: Secondary | ICD-10-CM | POA: Diagnosis present

## 2014-03-04 DIAGNOSIS — E1142 Type 2 diabetes mellitus with diabetic polyneuropathy: Secondary | ICD-10-CM | POA: Diagnosis present

## 2014-03-04 DIAGNOSIS — E7800 Pure hypercholesterolemia, unspecified: Secondary | ICD-10-CM | POA: Diagnosis present

## 2014-03-04 DIAGNOSIS — G4733 Obstructive sleep apnea (adult) (pediatric): Secondary | ICD-10-CM | POA: Diagnosis present

## 2014-03-04 DIAGNOSIS — Z794 Long term (current) use of insulin: Secondary | ICD-10-CM | POA: Diagnosis not present

## 2014-03-04 DIAGNOSIS — J4489 Other specified chronic obstructive pulmonary disease: Secondary | ICD-10-CM | POA: Diagnosis present

## 2014-03-04 DIAGNOSIS — Z91041 Radiographic dye allergy status: Secondary | ICD-10-CM

## 2014-03-04 DIAGNOSIS — R0602 Shortness of breath: Secondary | ICD-10-CM

## 2014-03-04 DIAGNOSIS — Z8614 Personal history of Methicillin resistant Staphylococcus aureus infection: Secondary | ICD-10-CM | POA: Diagnosis not present

## 2014-03-04 DIAGNOSIS — S88119A Complete traumatic amputation at level between knee and ankle, unspecified lower leg, initial encounter: Secondary | ICD-10-CM

## 2014-03-04 DIAGNOSIS — E039 Hypothyroidism, unspecified: Secondary | ICD-10-CM | POA: Diagnosis present

## 2014-03-04 DIAGNOSIS — Z8249 Family history of ischemic heart disease and other diseases of the circulatory system: Secondary | ICD-10-CM | POA: Diagnosis not present

## 2014-03-04 DIAGNOSIS — I4460 Unspecified fascicular block: Secondary | ICD-10-CM | POA: Diagnosis present

## 2014-03-04 DIAGNOSIS — IMO0002 Reserved for concepts with insufficient information to code with codable children: Secondary | ICD-10-CM | POA: Diagnosis present

## 2014-03-04 DIAGNOSIS — E1149 Type 2 diabetes mellitus with other diabetic neurological complication: Secondary | ICD-10-CM | POA: Diagnosis present

## 2014-03-04 DIAGNOSIS — I5032 Chronic diastolic (congestive) heart failure: Secondary | ICD-10-CM | POA: Diagnosis present

## 2014-03-04 DIAGNOSIS — Z833 Family history of diabetes mellitus: Secondary | ICD-10-CM

## 2014-03-04 DIAGNOSIS — J449 Chronic obstructive pulmonary disease, unspecified: Secondary | ICD-10-CM | POA: Diagnosis present

## 2014-03-04 DIAGNOSIS — F17200 Nicotine dependence, unspecified, uncomplicated: Secondary | ICD-10-CM | POA: Diagnosis present

## 2014-03-04 DIAGNOSIS — I10 Essential (primary) hypertension: Secondary | ICD-10-CM | POA: Diagnosis present

## 2014-03-04 DIAGNOSIS — Z7982 Long term (current) use of aspirin: Secondary | ICD-10-CM

## 2014-03-04 DIAGNOSIS — I495 Sick sinus syndrome: Secondary | ICD-10-CM | POA: Diagnosis present

## 2014-03-04 DIAGNOSIS — E118 Type 2 diabetes mellitus with unspecified complications: Secondary | ICD-10-CM

## 2014-03-04 DIAGNOSIS — K219 Gastro-esophageal reflux disease without esophagitis: Secondary | ICD-10-CM | POA: Diagnosis present

## 2014-03-04 DIAGNOSIS — R001 Bradycardia, unspecified: Secondary | ICD-10-CM

## 2014-03-04 HISTORY — DX: Chronic obstructive pulmonary disease, unspecified: J44.9

## 2014-03-04 HISTORY — DX: Bradycardia, unspecified: R00.1

## 2014-03-04 HISTORY — DX: Dependence on other enabling machines and devices: Z99.89

## 2014-03-04 HISTORY — DX: Obstructive sleep apnea (adult) (pediatric): G47.33

## 2014-03-04 LAB — URINALYSIS, ROUTINE W REFLEX MICROSCOPIC
BILIRUBIN URINE: NEGATIVE
Glucose, UA: 250 mg/dL — AB
Ketones, ur: NEGATIVE mg/dL
Leukocytes, UA: NEGATIVE
Nitrite: NEGATIVE
PH: 5 (ref 5.0–8.0)
Protein, ur: >300 mg/dL — AB
Specific Gravity, Urine: 1.026 (ref 1.005–1.030)
Urobilinogen, UA: 0.2 mg/dL (ref 0.0–1.0)

## 2014-03-04 LAB — URINE MICROSCOPIC-ADD ON

## 2014-03-04 LAB — BASIC METABOLIC PANEL
ANION GAP: 16 — AB (ref 5–15)
BUN: 29 mg/dL — AB (ref 6–23)
CHLORIDE: 99 meq/L (ref 96–112)
CO2: 26 mEq/L (ref 19–32)
Calcium: 9.5 mg/dL (ref 8.4–10.5)
Creatinine, Ser: 1.12 mg/dL — ABNORMAL HIGH (ref 0.50–1.10)
GFR calc Af Amer: 64 mL/min — ABNORMAL LOW (ref >90)
GFR calc non Af Amer: 55 mL/min — ABNORMAL LOW (ref >90)
Glucose, Bld: 183 mg/dL — ABNORMAL HIGH (ref 70–99)
Potassium: 4.5 mEq/L (ref 3.7–5.3)
Sodium: 141 mEq/L (ref 137–147)

## 2014-03-04 LAB — CBC WITH DIFFERENTIAL/PLATELET
Basophils Absolute: 0 10*3/uL (ref 0.0–0.1)
Basophils Relative: 0 % (ref 0–1)
Eosinophils Absolute: 0.1 10*3/uL (ref 0.0–0.7)
Eosinophils Relative: 1 % (ref 0–5)
HCT: 42.8 % (ref 36.0–46.0)
HEMOGLOBIN: 14.3 g/dL (ref 12.0–15.0)
LYMPHS PCT: 42 % (ref 12–46)
Lymphs Abs: 3.1 10*3/uL (ref 0.7–4.0)
MCH: 27.9 pg (ref 26.0–34.0)
MCHC: 33.4 g/dL (ref 30.0–36.0)
MCV: 83.4 fL (ref 78.0–100.0)
MONOS PCT: 7 % (ref 3–12)
Monocytes Absolute: 0.5 10*3/uL (ref 0.1–1.0)
NEUTROS ABS: 3.6 10*3/uL (ref 1.7–7.7)
Neutrophils Relative %: 49 % (ref 43–77)
Platelets: 190 10*3/uL (ref 150–400)
RBC: 5.13 MIL/uL — ABNORMAL HIGH (ref 3.87–5.11)
RDW: 15.3 % (ref 11.5–15.5)
WBC: 7.3 10*3/uL (ref 4.0–10.5)

## 2014-03-04 LAB — TROPONIN I: Troponin I: <0.3 ng/mL (ref <0.30)

## 2014-03-04 LAB — GLUCOSE, CAPILLARY: GLUCOSE-CAPILLARY: 181 mg/dL — AB (ref 70–99)

## 2014-03-04 LAB — PRO B NATRIURETIC PEPTIDE: Pro B Natriuretic peptide (BNP): 501.5 pg/mL — ABNORMAL HIGH (ref 0–125)

## 2014-03-04 MED ORDER — FLUTICASONE FUROATE-VILANTEROL 100-25 MCG/INH IN AEPB: 1.0000 | INHALATION_SPRAY | Freq: Every day | RESPIRATORY_TRACT | Status: DC

## 2014-03-04 MED ORDER — INSULIN DETEMIR 100 UNIT/ML ~~LOC~~ SOLN
100.0000 [IU] | Freq: Every day | SUBCUTANEOUS | Status: DC
  Filled 2014-03-04: qty 1

## 2014-03-04 MED ORDER — OXYBUTYNIN CHLORIDE 5 MG PO TABS
5.0000 mg | ORAL_TABLET | Freq: Every day | ORAL | Status: DC
  Administered 2014-03-05 – 2014-03-07 (×3): 5 mg via ORAL
  Filled 2014-03-04 (×3): qty 1

## 2014-03-04 MED ORDER — SODIUM CHLORIDE 0.9 % IV SOLN
INTRAVENOUS | Status: DC
  Administered 2014-03-04: 23:00:00 via INTRAVENOUS

## 2014-03-04 MED ORDER — ONDANSETRON HCL 4 MG PO TABS: 4.0000 mg | ORAL_TABLET | Freq: Four times a day (QID) | ORAL | Status: DC | PRN

## 2014-03-04 MED ORDER — ACETAMINOPHEN 650 MG RE SUPP: 650.0000 mg | Freq: Four times a day (QID) | RECTAL | Status: DC | PRN

## 2014-03-04 MED ORDER — ONDANSETRON HCL 4 MG/2ML IJ SOLN: 4.0000 mg | Freq: Four times a day (QID) | INTRAMUSCULAR | Status: DC | PRN

## 2014-03-04 MED ORDER — LEVOTHYROXINE SODIUM 25 MCG PO TABS
25.0000 ug | ORAL_TABLET | Freq: Every day | ORAL | Status: DC
  Administered 2014-03-05 – 2014-03-07 (×3): 25 ug via ORAL
  Filled 2014-03-04 (×5): qty 1

## 2014-03-04 MED ORDER — GABAPENTIN 100 MG PO CAPS
100.0000 mg | ORAL_CAPSULE | Freq: Two times a day (BID) | ORAL | Status: DC
  Administered 2014-03-04 – 2014-03-07 (×6): 100 mg via ORAL
  Filled 2014-03-04 (×9): qty 1

## 2014-03-04 MED ORDER — FUROSEMIDE 20 MG PO TABS
20.0000 mg | ORAL_TABLET | Freq: Every day | ORAL | Status: DC
  Administered 2014-03-05 – 2014-03-07 (×3): 20 mg via ORAL
  Filled 2014-03-04 (×3): qty 1

## 2014-03-04 MED ORDER — ALBUTEROL SULFATE (2.5 MG/3ML) 0.083% IN NEBU: 2.5000 mg | INHALATION_SOLUTION | Freq: Four times a day (QID) | RESPIRATORY_TRACT | Status: DC | PRN

## 2014-03-04 MED ORDER — BUDESONIDE-FORMOTEROL FUMARATE 160-4.5 MCG/ACT IN AERO
2.0000 | INHALATION_SPRAY | Freq: Two times a day (BID) | RESPIRATORY_TRACT | Status: DC
  Administered 2014-03-05 – 2014-03-07 (×3): 2 via RESPIRATORY_TRACT
  Filled 2014-03-04 (×3): qty 6

## 2014-03-04 MED ORDER — INSULIN ASPART 100 UNIT/ML ~~LOC~~ SOLN: 0.0000 [IU] | Freq: Every day | SUBCUTANEOUS | Status: DC

## 2014-03-04 MED ORDER — SODIUM CHLORIDE 0.9 % IJ SOLN
3.0000 mL | Freq: Two times a day (BID) | INTRAMUSCULAR | Status: DC
  Administered 2014-03-04 – 2014-03-06 (×4): 3 mL via INTRAVENOUS

## 2014-03-04 MED ORDER — HEPARIN SODIUM (PORCINE) 5000 UNIT/ML IJ SOLN
5000.0000 [IU] | Freq: Three times a day (TID) | INTRAMUSCULAR | Status: DC
  Administered 2014-03-04 – 2014-03-07 (×7): 5000 [IU] via SUBCUTANEOUS
  Filled 2014-03-04 (×11): qty 1

## 2014-03-04 MED ORDER — DOCUSATE SODIUM 100 MG PO CAPS
100.0000 mg | ORAL_CAPSULE | Freq: Two times a day (BID) | ORAL | Status: DC
  Administered 2014-03-05 – 2014-03-07 (×3): 100 mg via ORAL
  Filled 2014-03-04 (×8): qty 1

## 2014-03-04 MED ORDER — TIOTROPIUM BROMIDE MONOHYDRATE 18 MCG IN CAPS
18.0000 ug | ORAL_CAPSULE | Freq: Every day | RESPIRATORY_TRACT | Status: DC
Start: 2014-03-05 — End: 2014-03-07
  Administered 2014-03-05 – 2014-03-07 (×2): 18 ug via RESPIRATORY_TRACT
  Filled 2014-03-04 (×3): qty 5

## 2014-03-04 MED ORDER — ACETAMINOPHEN 325 MG PO TABS: 650.0000 mg | ORAL_TABLET | Freq: Four times a day (QID) | ORAL | Status: DC | PRN

## 2014-03-04 MED ORDER — ASPIRIN EC 81 MG PO TBEC
81.0000 mg | DELAYED_RELEASE_TABLET | Freq: Every day | ORAL | Status: DC
  Administered 2014-03-05 – 2014-03-07 (×3): 81 mg via ORAL
  Filled 2014-03-04 (×3): qty 1

## 2014-03-04 MED ORDER — PANTOPRAZOLE SODIUM 40 MG PO TBEC
40.0000 mg | DELAYED_RELEASE_TABLET | Freq: Every day | ORAL | Status: DC
  Administered 2014-03-05 – 2014-03-07 (×3): 40 mg via ORAL
  Filled 2014-03-04 (×3): qty 1

## 2014-03-04 MED ORDER — ATORVASTATIN CALCIUM 80 MG PO TABS
80.0000 mg | ORAL_TABLET | Freq: Every day | ORAL | Status: DC
  Administered 2014-03-05 – 2014-03-06 (×2): 80 mg via ORAL
  Filled 2014-03-04 (×4): qty 1

## 2014-03-04 MED ORDER — INSULIN ASPART 100 UNIT/ML ~~LOC~~ SOLN: 0.0000 [IU] | Freq: Three times a day (TID) | SUBCUTANEOUS | Status: DC

## 2014-03-04 MED ORDER — FLUOXETINE HCL 20 MG PO TABS
20.0000 mg | ORAL_TABLET | Freq: Two times a day (BID) | ORAL | Status: DC
  Administered 2014-03-05 – 2014-03-07 (×5): 20 mg via ORAL
  Filled 2014-03-04 (×8): qty 1

## 2014-03-04 MED ORDER — ASPIRIN EC 81 MG PO TBEC: 81.0000 mg | DELAYED_RELEASE_TABLET | Freq: Every day | ORAL | Status: DC

## 2014-03-04 NOTE — ED Provider Notes (Signed)
CSN: [id number]     Arrival date & time 03/04/14  1544 History   First MD Initiated Contact with Patient 03/04/14 1609     Chief Complaint  Patient presents with  . Bradycardia      HPI Pt was seen at 1615. Per pt, c/o gradual onset and worsening of persistent "slow heart rate" for the past 2 to 3 months. Pt states her Home Health RN came to check on her today and noted her "HR to be 37." Pt states she has felt "lightheaded" and "SOB" for the past several days. States SOB worsens with laying flat. Pt states she was evaluated by her Cards MD 2 days ago for same: her lasix and amlodipine doses were lowered. Denies palpitations, no cough, no abd pain, no N/V/D, no focal motor weakness, no tingling/numbness in extremities, no syncope.     Cards: Dr. Bary Castilla in [facility] Past Medical History  Diagnosis Date  . Diabetic neuropathy   . Cellulitis   . Thyroid disease   . Hyperlipidemia   . Heart murmur   . Neuromuscular disorder   . Concussion as a teenager    slight  . Diarrhea     pt takes Reglan tid  . Hypothyroidism     takes Synthroid daily  . Depression     takes CYmbalta daily  . Peripheral edema     takes Lasix daily  . MRSA (methicillin resistant staph aureus) culture positive     hx of 2012  . Staph infection 2007  . Complication of anesthesia 01/2013    "didn't know where I was; who I was; talking out the top of my head" (2013/05/06)  . Hypertension   . Peripheral vascular disease   . CHF (congestive heart failure)   . Pneumonia 1980's    "hospitalized w/it" (May 06, 2013)  . Chronic bronchitis   . Orthopnea     "just one; yesterday morning" (May 06, 2013)  . Type II diabetes mellitus     Novolog,Levemir,and Lovaza daily  . GERD (gastroesophageal reflux disease)   . Daily headache   . Rheumatoid arthritis     "both knees" (2013-05-06)  . Anxiety   . Kidney stone     "passed on before" (2013-05-06)   Past Surgical History  Procedure Laterality Date  . Amputation    . Leg  amputation below knee Right 2012  . Toe amputation Left ?2011     only 2 toes remaning.   . Tubal ligation  1990's  . Foot surgery Right 2012    "put pins in one year; amputated toes another OR" (06-May-2013)  . Wrist surgery Right 1980's    "pinched nerve" (2013/05/06)  . Dilation and curettage of uterus  1980's  . Refractive surgery Bilateral 2014  . Incision and drainage abscess N/A 01/24/2013    Procedure: INCISION AND DRAINAGE ABSCESS;  Surgeon: Clovis Pu. Cornett, MD;  Location: [facility] OR;  Service: General;  Laterality: N/A;  . Inguinal hernia repair N/A 01/28/2013    Procedure: I&D Right Groin Wound;  Surgeon: Shelly Rubenstein, MD;  Location: [facility] OR;  Service: General;  Laterality: N/A;  . Ganglion cyst excision Left 1990's    "wrist" (2013-05-06)  . Bladder surgery  1970's    "stretched the mouth of my bladder" (05/06/13)  . I&d extremity Left 08/25/2013    Procedure: IRRIGATION AND DEBRIDEMENT EXTREMITY;  Surgeon: Axel Filler, MD;  Location: [facility] OR;  Service: General;  Laterality: Left;   Family History  Problem Relation  Age of Onset  . Hypertension Mother   . Hyperlipidemia Mother   . Hypertension Brother   . Hyperlipidemia Brother   . Cancer Maternal Aunt   . Diabetes Paternal Aunt   . Diabetes Paternal Uncle   . Diabetes Paternal Grandmother   . Anesthesia problems Neg Hx   . Hypotension Neg Hx   . Malignant hyperthermia Neg Hx   . Pseudochol deficiency Neg Hx    History  Substance Use Topics  . Smoking status: Current Every Day Smoker -- 0.50 packs/day for 33 years    Types: Cigarettes  . Smokeless tobacco: Never Used  . Alcohol Use: No    Review of Systems ROS: Statement: All systems negative except as marked or noted in the HPI; Constitutional: Negative for fever and chills. ; ; Eyes: Negative for eye pain, redness and discharge. ; ; ENMT: Negative for ear pain, hoarseness, nasal congestion, sinus pressure and sore throat. ; ; Cardiovascular: +SOB. Negative for chest  pain, palpitations, diaphoresis, and peripheral edema. ; ; Respiratory: Negative for cough, wheezing and stridor. ; ; Gastrointestinal: Negative for nausea, vomiting, diarrhea, abdominal pain, blood in stool, hematemesis, jaundice and rectal bleeding. . ; ; Genitourinary: Negative for dysuria, flank pain and hematuria. ; ; Musculoskeletal: Negative for back pain and neck pain. Negative for swelling and trauma.; ; Skin: Negative for pruritus, rash, abrasions, blisters, bruising and skin lesion.; ; Neuro: +lightheadedness. Negative for headache and neck stiffness. Negative for weakness, altered level of consciousness , altered mental status, extremity weakness, paresthesias, involuntary movement, seizure and syncope.      Allergies  Gadolinium; Ivp dye; Metformin; and Penicillins  Home Medications   Prior to Admission medications   Medication Sig Start Date End Date Taking? Authorizing Provider  albuterol (PROVENTIL HFA;VENTOLIN HFA) 108 (90 BASE) MCG/ACT inhaler Inhale 2 puffs into the lungs every 6 (six) hours as needed for wheezing or shortness of breath.   Yes Historical Provider, MD  amLODipine (NORVASC) 10 MG tablet Take 10 mg by mouth daily.   Yes Historical Provider, MD  gabapentin (NEURONTIN) 100 MG capsule Take 100 mg by mouth 2 (two) times daily.   Yes Historical Provider, MD  aspirin EC 81 MG tablet Take 81 mg by mouth daily.    Historical Provider, MD  atorvastatin (LIPITOR) 80 MG tablet Take 80 mg by mouth daily.    Historical Provider, MD  FLUoxetine (PROZAC) 20 MG tablet Take 20 mg by mouth 2 (two) times daily.     Historical Provider, MD  Fluticasone Furoate-Vilanterol (BREO ELLIPTA) 100-25 MCG/INH AEPB Inhale 1 puff into the lungs daily.    Historical Provider, MD  furosemide (LASIX) 20 MG tablet Take 20 mg by mouth daily.    Historical Provider, MD  insulin aspart (NOVOLOG) 100 UNIT/ML injection Inject 36-48 Units into the skin 3 (three) times daily with meals. *per sliding scale     Historical Provider, MD  insulin detemir (LEVEMIR) 100 UNIT/ML injection Inject 140 Units into the skin every morning. *uses 70 units on each side    Historical Provider, MD  levothyroxine (SYNTHROID, LEVOTHROID) 25 MCG tablet Take 25 mcg by mouth every morning.     Historical Provider, MD  metoCLOPramide (REGLAN) 5 MG tablet Take 5 mg by mouth 3 (three) times daily.     Historical Provider, MD  omeprazole (PRILOSEC) 20 MG capsule Take 20 mg by mouth daily.    Historical Provider, MD  oxybutynin (DITROPAN) 5 MG tablet Take 5 mg by mouth every  morning.      Historical Provider, MD  sulfamethoxazole-trimethoprim (BACTRIM DS) 800-160 MG per tablet Take 1 tablet by mouth 3 (three) times daily. For 10 days 11/22/13   Historical Provider, MD  tiotropium (SPIRIVA) 18 MCG inhalation capsule Place 18 mcg into inhaler and inhale daily.    Historical Provider, MD   BP 135/62  Temp(Src) 98.3 F (36.8 C) (Oral)  Resp 22  SpO2 95% Physical Exam 1520: Physical examination:  Nursing notes reviewed; Vital signs and O2 SAT reviewed;  Constitutional: Well developed, Well nourished, Well hydrated, In no acute distress; Head:  Normocephalic, atraumatic; Eyes: EOMI, PERRL, No scleral icterus; ENMT: Mouth and pharynx normal, Mucous membranes moist; Neck: Supple, Full range of motion, No lymphadenopathy; Cardiovascular: Bradycardic rate and rhythm, No gallop; Respiratory: Breath sounds coarse & equal bilaterally, No wheezes.  Speaking full sentences with ease, Normal respiratory effort/excursion; Chest: Nontender, Movement normal; Abdomen: Soft, Nontender, Nondistended, Normal bowel sounds; Genitourinary: No CVA tenderness; Extremities: Pulses normal, No tenderness, No edema, +RLE BKA..; Neuro: AA&Ox3, Major CN grossly intact.  Speech clear. No gross focal motor or sensory deficits in extremities.; Skin: Color normal, Warm, Dry.   ED Course  Procedures     EKG Interpretation   Date/Time:  Wednesday March 04 2014  15:47:50 EDT Ventricular Rate:  44 PR Interval:  203 QRS Duration: 95 QT Interval:  501 QTC Calculation: 429 R Axis:   -52 Text Interpretation:  Sinus bradycardia Left anterior fascicular block  Abnormal R-wave progression, late transition LVH with secondary  repolarization abnormality Baseline wander When compared with ECG of  11/24/2013 Rate slower Confirmed by [facility]  MD, Graves Nipp [phone]) on  03/04/2014 4:36:22 PM       EKG Interpretation  Date/Time:  Wednesday March 04 2014 18:13:39 EDT Ventricular Rate:  37 PR Interval:  142 QRS Duration: 93 QT Interval:  538 QTC Calculation: 422 R Axis:   -50 Text Interpretation:  Sinus bradycardia Left anterior fascicular block Left axis deviation Abnormal R-wave progression, late transition Consider left ventricular hypertrophy Since last tracing of earlier today No significant change was found Confirmed by [facility]  MD, Nicholos Johns [phone]) on 03/04/2014 6:39:56 PM        MDM  MDM Reviewed: previous chart, nursing note and vitals Reviewed previous: labs and ECG Interpretation: labs, ECG and x-ray   Results for orders placed during the hospital encounter of 03/04/14  CBC WITH DIFFERENTIAL      Result Value Ref Range   WBC 7.3  4.0 - 10.5 K/uL   RBC 5.13 (*) 3.87 - 5.11 MIL/uL   Hemoglobin 14.3  12.0 - 15.0 g/dL   HCT 09.8  11.9 - 14.7 %   MCV 83.4  78.0 - 100.0 fL   MCH 27.9  26.0 - 34.0 pg   MCHC 33.4  30.0 - 36.0 g/dL   RDW 82.9  56.2 - 13.0 %   Platelets 190  150 - 400 K/uL   Neutrophils Relative % 49  43 - 77 %   Neutro Abs 3.6  1.7 - 7.7 K/uL   Lymphocytes Relative 42  12 - 46 %   Lymphs Abs 3.1  0.7 - 4.0 K/uL   Monocytes Relative 7  3 - 12 %   Monocytes Absolute 0.5  0.1 - 1.0 K/uL   Eosinophils Relative 1  0 - 5 %   Eosinophils Absolute 0.1  0.0 - 0.7 K/uL   Basophils Relative 0  0 - 1 %   Basophils Absolute 0.0  0.0 - 0.1 K/uL  BASIC METABOLIC PANEL      Result Value Ref Range   Sodium 141  137 - 147 mEq/L    Potassium 4.5  3.7 - 5.3 mEq/L   Chloride 99  96 - 112 mEq/L   CO2 26  19 - 32 mEq/L   Glucose, Bld 183 (*) 70 - 99 mg/dL   BUN 29 (*) 6 - 23 mg/dL   Creatinine, Ser 4.19 (*) 0.50 - 1.10 mg/dL   Calcium 9.5  8.4 - 62.2 mg/dL   GFR calc non Af Amer 55 (*) >90 mL/min   GFR calc Af Amer 64 (*) >90 mL/min   Anion gap 16 (*) 5 - 15  URINALYSIS, ROUTINE W REFLEX MICROSCOPIC      Result Value Ref Range   Color, Urine YELLOW  YELLOW   APPearance CLEAR  CLEAR   Specific Gravity, Urine 1.026  1.005 - 1.030   pH 5.0  5.0 - 8.0   Glucose, UA 250 (*) NEGATIVE mg/dL   Hgb urine dipstick MODERATE (*) NEGATIVE   Bilirubin Urine NEGATIVE  NEGATIVE   Ketones, ur NEGATIVE  NEGATIVE mg/dL   Protein, ur >297 (*) NEGATIVE mg/dL   Urobilinogen, UA 0.2  0.0 - 1.0 mg/dL   Nitrite NEGATIVE  NEGATIVE   Leukocytes, UA NEGATIVE  NEGATIVE  TROPONIN I      Result Value Ref Range   Troponin I <0.30  <0.30 ng/mL  PRO B NATRIURETIC PEPTIDE      Result Value Ref Range   Pro B Natriuretic peptide (BNP) 501.5 (*) 0 - 125 pg/mL  URINE MICROSCOPIC-ADD ON      Result Value Ref Range   Squamous Epithelial / LPF FEW (*) RARE   WBC, UA 0-2  <3 WBC/hpf   RBC / HPF 3-6  <3 RBC/hpf   Bacteria, UA FEW (*) RARE   Casts HYALINE CASTS (*) NEGATIVE   Urine-Other MUCOUS PRESENT     Dg Chest 2 View 03/04/2014   CLINICAL DATA:  Bradycardia. Dizziness. Weakness. Diabetes and hypertension.  EXAM: CHEST  2 VIEW  COMPARISON:  08/24/2013  FINDINGS: The heart size and mediastinal contours are within normal limits. Both lungs are clear. The visualized skeletal structures are unremarkable.  IMPRESSION: No active cardiopulmonary disease.   Electronically Signed   By: Myles Rosenthal M.D.   On: 03/04/2014 18:21    1850:  Pt not orthostatic. Monitor with sinus bradycardia to 37, with pt Sats dropping to 87% R/A and c/o generalized fatigue. BP stable. Continues to deny CP. Dx and testing d/w pt.  Questions answered.  Verb understanding,  agreeable to admit. T/C to Cardiology, case discussed, including:  HPI, pertinent PM/SHx, VS/PE, dx testing, ED course and treatment:  Agreeable to come to ED for evaluation to admit.       Laray Anger, DO 03/05/14 1544

## 2014-03-04 NOTE — ED Notes (Signed)
Pt to ED via [facility] for evaluation of bradycardia- pt has home health nurse who reports pt has been bradycardic since May as low as 40bpm, today the home health nurse found pulse to be 37 and requested transport.  Pt reports seeing her cardiologist and is scheduled to have Holter monitor placed in two weeks.  Pt reports dizziness associated with bradycardia, denies any abnormal symptoms today.   Pt alert and oriented X 4 at present. Denies chest pain or shortness of breath.

## 2014-03-04 NOTE — ED Notes (Signed)
EDP- Dr. Clarene Duke aware of pt heart rate- does not want pacer pads placed at this time.

## 2014-03-04 NOTE — ED Notes (Signed)
IV attempt X 2 without success- will have 2nd RN assess.

## 2014-03-04 NOTE — H&P (Signed)
[name] is an 53 y.o. female.    Primary Cardiologist:NEW, thuogh does see cardiologist in [facility] PCP: TALBOT, DAVID C, MD  Chief Complaint: sleepy, HHRN found her HR to be 39s HPI: 53 year old WF with cardiac hx of heart failure on lasix, or at least a lot of fluid, poor historian.  She believes she has had an echo in the past but is not sure.  She did have echo 04/2013 with EF 60-65% with mild LVH and grade 2 diastolic dysfunction.   + aortic sclerosis and hx of junctional rhythm at times dating back to 08/18/14.  She reports problems with HR beginning in May of this year.  AT times it is very slow, sometimes she feels like she may pass out, she becomes nauseated as well and is dizzy at times.  When she has seen her cardiologist, her HR will be normal.    These episodes are becoming more frequent.  She also feels more sleepy than usual.  Today her home health RN was there to attend the nonhealing wound on her foot and found the pt's HR to be in the 30s.  Initial EKG with HR 37 with LAFB, LAD, Sinus [name].  On Monitor she has been down to 30 and at times junctional rhythm.  She has no chest pain, only SOB is when she lies flat and that is due she believes to recent decrease in her lasix.  Her BP has been stable while in stretcher.  She is not on any rate lowering meds.    Other hx as below with insulin dependent diabetes, and rt BKA, now Lt non healing wound o Lt foot. COPD with home oxygen and OSA with CPAP and oxygen.   Past Medical History  Diagnosis Date  . Diabetic neuropathy   . Cellulitis   . Thyroid disease   . Hyperlipidemia   . Heart murmur   . Neuromuscular disorder   . Concussion as a teenager    slight  . Diarrhea     pt takes Reglan tid  . Hypothyroidism     takes Synthroid daily  . Depression     takes CYmbalta daily  . Peripheral edema     takes Lasix daily  . MRSA (methicillin resistant staph aureus) culture positive     hx of 2012  . Staph  infection 2007  . Complication of anesthesia 01/2013    "didn't know where I was; who I was; talking out the top of my head" (05/18/13)  . Hypertension   . Peripheral vascular disease   . CHF (congestive heart failure)   . Pneumonia 1980's    "hospitalized w/it" (05-18-2013)  . Chronic bronchitis   . Orthopnea     "just one; yesterday morning" (05-18-13)  . Type II diabetes mellitus     Novolog,Levemir,and Lovaza daily  . GERD (gastroesophageal reflux disease)   . Daily headache   . Rheumatoid arthritis     "both knees" (May 18, 2013)  . Anxiety   . Kidney stone     "passed on before" (May 18, 2013)  . Non-healing wound of lower extremity     lt foot  . OSA on CPAP     and oxygen  . COPD (chronic obstructive pulmonary disease)     wear oxygen at home  . Symptomatic bradycardia 03/04/2014    Past Surgical History  Procedure Laterality Date  . Amputation    . Leg amputation below  knee Right 2012  . Toe amputation Left ?2011     only 2 toes remaning.   . Tubal ligation  1990's  . Foot surgery Right 2012    "put pins in one year; amputated toes another OR" (04/21/2013)  . Wrist surgery Right 1980's    "pinched nerve" (04/21/2013)  . Dilation and curettage of uterus  1980's  . Refractive surgery Bilateral 2014  . Incision and drainage abscess N/A 01/24/2013    Procedure: INCISION AND DRAINAGE ABSCESS;  Surgeon: Joyice Faster. Cornett, MD;  Location: [place];  Service: General;  Laterality: N/A;  . Inguinal hernia repair N/A 01/28/2013    Procedure: I&D Right Groin Wound;  Surgeon: Harl Bowie, MD;  Location: [place];  Service: General;  Laterality: N/A;  . Ganglion cyst excision Left 1990's    "wrist" (04/21/2013)  . Bladder surgery  1970's    "stretched the mouth of my bladder" (04/21/2013)  . I&d extremity Left 08/25/2013    Procedure: IRRIGATION AND DEBRIDEMENT EXTREMITY;  Surgeon: Ralene Ok, MD;  Location: [facility];  Service: General;  Laterality: Left;    Family History  Problem  Relation Age of Onset  . Hypertension Mother   . Hyperlipidemia Mother   . Hypertrophic cardiomyopathy Mother     has pacer  . Hypertension Brother   . Hyperlipidemia Brother   . Cancer Maternal Aunt   . Diabetes Paternal Aunt   . Diabetes Paternal Uncle   . Diabetes Paternal Grandmother   . Anesthesia problems Neg Hx   . Hypotension Neg Hx   . Malignant hyperthermia Neg Hx   . Pseudochol deficiency Neg Hx   . Alzheimer's disease Father    Social History:  reports that she has been smoking Cigarettes.  She has a 33 pack-year smoking history. She has never used smokeless tobacco. She reports that she does not drink alcohol or use illicit drugs.  Allergies:  Allergies  Allergen Reactions  . Gadolinium      Code: VOM, Desc: Pt began vomiting immed post infusion of multihance, Onset Date: [id number]   . Ivp Dye [Iodinated Diagnostic Agents] Nausea And Vomiting  . Metformin Other (See Comments)     diarrhea  . Penicillins Hives    No current facility-administered medications on file prior to encounter.   Current Outpatient Prescriptions on File Prior to Encounter  Medication Sig Dispense Refill  . aspirin EC 81 MG tablet Take 81 mg by mouth daily.      Marland Kitchen atorvastatin (LIPITOR) 80 MG tablet Take 80 mg by mouth daily.      Marland Kitchen FLUoxetine (PROZAC) 20 MG tablet Take 20 mg by mouth 2 (two) times daily.       . Fluticasone Furoate-Vilanterol (BREO ELLIPTA) 100-25 MCG/INH AEPB Inhale 1 puff into the lungs daily.      . furosemide (LASIX) 20 MG tablet Take 20 mg by mouth daily.      . insulin aspart (NOVOLOG) 100 UNIT/ML injection Inject 36-48 Units into the skin 3 (three) times daily with meals. *per sliding scale      . insulin detemir (LEVEMIR) 100 UNIT/ML injection Inject 140 Units into the skin every morning. *uses 70 units on each side      . levothyroxine (SYNTHROID, LEVOTHROID) 25 MCG tablet Take 25 mcg by mouth every morning.       . metoCLOPramide (REGLAN) 5 MG tablet Take 5 mg by  mouth 3 (three) times daily.       Marland Kitchen omeprazole (  PRILOSEC) 20 MG capsule Take 20 mg by mouth daily.      Marland Kitchen oxybutynin (DITROPAN) 5 MG tablet Take 5 mg by mouth every morning.        . tiotropium (SPIRIVA) 18 MCG inhalation capsule Place 18 mcg into inhaler and inhale daily.        Results for orders placed during the hospital encounter of 03/04/14 (from the past 48 hour(s))  CBC WITH DIFFERENTIAL     Status: Abnormal   Collection Time    03/04/14  4:34 PM      Result Value Ref Range   WBC 7.3  4.0 - 10.5 K/uL   RBC 5.13 (*) 3.87 - 5.11 MIL/uL   Hemoglobin 14.3  12.0 - 15.0 g/dL   HCT 42.8  36.0 - 46.0 %   MCV 83.4  78.0 - 100.0 fL   MCH 27.9  26.0 - 34.0 pg   MCHC 33.4  30.0 - 36.0 g/dL   RDW 15.3  11.5 - 15.5 %   Platelets 190  150 - 400 K/uL   Comment: REPEATED TO VERIFY     SPECIMEN CHECKED FOR CLOTS   Neutrophils Relative % 49  43 - 77 %   Neutro Abs 3.6  1.7 - 7.7 K/uL   Lymphocytes Relative 42  12 - 46 %   Lymphs Abs 3.1  0.7 - 4.0 K/uL   Monocytes Relative 7  3 - 12 %   Monocytes Absolute 0.5  0.1 - 1.0 K/uL   Eosinophils Relative 1  0 - 5 %   Eosinophils Absolute 0.1  0.0 - 0.7 K/uL   Basophils Relative 0  0 - 1 %   Basophils Absolute 0.0  0.0 - 0.1 K/uL  BASIC METABOLIC PANEL     Status: Abnormal   Collection Time    03/04/14  4:34 PM      Result Value Ref Range   Sodium 141  137 - 147 mEq/L   Potassium 4.5  3.7 - 5.3 mEq/L   Chloride 99  96 - 112 mEq/L   CO2 26  19 - 32 mEq/L   Glucose, Bld 183 (*) 70 - 99 mg/dL   BUN 29 (*) 6 - 23 mg/dL   Creatinine, Ser 1.12 (*) 0.50 - 1.10 mg/dL   Calcium 9.5  8.4 - 10.5 mg/dL   GFR calc non Af Amer 55 (*) >90 mL/min   GFR calc Af Amer 64 (*) >90 mL/min   Comment: (NOTE)     The eGFR has been calculated using the CKD EPI equation.     This calculation has not been validated in all clinical situations.     eGFR's persistently <90 mL/min signify possible Chronic Kidney     Disease.   Anion gap 16 (*) 5 - 15  TROPONIN I      Status: None   Collection Time    03/04/14  4:34 PM      Result Value Ref Range   Troponin I <0.30  <0.30 ng/mL   Comment:            Due to the release kinetics of cTnI,     a negative result within the first hours     of the onset of symptoms does not rule out     myocardial infarction with certainty.     If myocardial infarction is still suspected,     repeat the test at appropriate intervals.  PRO B NATRIURETIC PEPTIDE  Status: Abnormal   Collection Time    03/04/14  4:34 PM      Result Value Ref Range   Pro B Natriuretic peptide (BNP) 501.5 (*) 0 - 125 pg/mL  URINALYSIS, ROUTINE W REFLEX MICROSCOPIC     Status: Abnormal   Collection Time    03/04/14  4:55 PM      Result Value Ref Range   Color, Urine YELLOW  YELLOW   APPearance CLEAR  CLEAR   Specific Gravity, Urine 1.026  1.005 - 1.030   pH 5.0  5.0 - 8.0   Glucose, UA 250 (*) NEGATIVE mg/dL   Hgb urine dipstick MODERATE (*) NEGATIVE   Bilirubin Urine NEGATIVE  NEGATIVE   Ketones, ur NEGATIVE  NEGATIVE mg/dL   Protein, ur >300 (*) NEGATIVE mg/dL   Urobilinogen, UA 0.2  0.0 - 1.0 mg/dL   Nitrite NEGATIVE  NEGATIVE   Leukocytes, UA NEGATIVE  NEGATIVE  URINE MICROSCOPIC-ADD ON     Status: Abnormal   Collection Time    03/04/14  4:55 PM      Result Value Ref Range   Squamous Epithelial / LPF FEW (*) RARE   WBC, UA 0-2  <3 WBC/hpf   RBC / HPF 3-6  <3 RBC/hpf   Bacteria, UA FEW (*) RARE   Casts HYALINE CASTS (*) NEGATIVE   Urine-Other MUCOUS PRESENT     Dg Chest 2 View  03/04/2014   CLINICAL DATA:  Bradycardia. Dizziness. Weakness. Diabetes and hypertension.  EXAM: CHEST  2 VIEW  COMPARISON:  08/24/2013  FINDINGS: The heart size and mediastinal contours are within normal limits. Both lungs are clear. The visualized skeletal structures are unremarkable.  IMPRESSION: No active cardiopulmonary disease.   Electronically Signed   By: Earle Gell M.D.   On: 03/04/2014 18:21    ROS: General:no colds or fevers, no weight  changes Skin:no rashes + Lt foot ulcer with boot in place, dressing changed by [facility] every other day HEENT:no blurred vision, no congestion CV:see HPI PUL:see HPI GI:no diarrhea constipation or melena, no indigestion GU:no hematuria, no dysuria MS:no joint pain, no claudication, Rt BKA due to  Neuro:+near syncope at times since May, + lightheadedness/dizziness at times Endo:+insulin dependent diabetes X 22years, + thyroid disease   Blood pressure 142/60, pulse 37, temperature 98.3 F (36.8 C), temperature source Oral, resp. rate 13, SpO2 93.00%. PE: General:Pleasant affect, NAD Skin:Warm and dry, brisk capillary refill HEENT:normocephalic, sclera clear, mucus membranes moist Neck:supple, no JVD, no bruits  Heart:S1S2 RRR with soft systolic murmur, no gallup, rub or click Lungs: without rales, some rhonchi, no wheezes JJO:ACZYS, soft, non tender, + BS, do not palpate liver spleen or masses Ext:no lower ext edema, on Lt, Rt BKA, Lt with boot in place, dressing changed today 2+ radial pulses Neuro:alert and oriented X 3, MAE, follows commands, + facial symmetry    Assessment/Plan Principal Problem:   Symptomatic bradycardia- in the 30s, junct at times, has had episodes of near syncope, on no rate lowering meds.  Check thyroid keep NPO after MN with EP consult for PPM in am. Active Problems:   Diabetes mellitus type 2, uncontrolled, with complications   TOBACCO ABUSE   HYPERCHOLESTEROLEMIA-treated   Hypothyroidism-treated   Chronic diastolic heart failure- pro BNP at 500 but CXR without HF   OSA on CPAP with oxygen   COPD (chronic obstructive pulmonary disease), on home O2    [facility] R Nurse Practitioner Certified [place] Pager [phone] or after  5pm or weekends call [phone] 03/04/2014, 8:53 PM    Patient seen and discussed with NP Dorene Ar, I agree with her documentation above. She is a 53 yo female with history of DM2, chronic diastolic heart  failure, prior below the knee ampuation, chronic foot ulcer admitted with symptomatic bradycardia   Pro-BNP 501, trop neg x1, Cr 1.12, K 4.5, Hgb 14.3, plt 190 CXR no acute process EKG sinus brady 45, LAFB. Telemetry shows junctional rhythm at times low 40s. Blood pressure remains stable.   Will admit to stepdown overnight with pace pads in place in case needed, though she remains hemodynamically stable. Hold meds associated with bradycardia, check TSH. Will ask EP to eval in AM.  Zandra Abts MD

## 2014-03-05 ENCOUNTER — Encounter (HOSPITAL_COMMUNITY)
Admission: EM | Disposition: A | Discharge status: Home Health | Payer: Self-pay | Source: Home / Self Care | Attending: Cardiology

## 2014-03-05 DIAGNOSIS — I379 Nonrheumatic pulmonary valve disorder, unspecified: Secondary | ICD-10-CM

## 2014-03-05 DIAGNOSIS — I495 Sick sinus syndrome: Secondary | ICD-10-CM

## 2014-03-05 HISTORY — PX: PERMANENT PACEMAKER INSERTION: SHX5480

## 2014-03-05 LAB — T4, FREE: Free T4: 1.04 ng/dL (ref 0.80–1.80)

## 2014-03-05 LAB — COMPREHENSIVE METABOLIC PANEL
ALK PHOS: 127 U/L — AB (ref 39–117)
ALT: 23 U/L (ref 0–35)
AST: 25 U/L (ref 0–37)
Albumin: 2.8 g/dL — ABNORMAL LOW (ref 3.5–5.2)
Anion gap: 16 — ABNORMAL HIGH (ref 5–15)
BUN: 29 mg/dL — ABNORMAL HIGH (ref 6–23)
CO2: 26 meq/L (ref 19–32)
Calcium: 9 mg/dL (ref 8.4–10.5)
Chloride: 100 mEq/L (ref 96–112)
Creatinine, Ser: 1.14 mg/dL — ABNORMAL HIGH (ref 0.50–1.10)
GFR calc Af Amer: 63 mL/min — ABNORMAL LOW (ref >90)
GFR, EST NON AFRICAN AMERICAN: 54 mL/min — AB (ref >90)
GLUCOSE: 148 mg/dL — AB (ref 70–99)
POTASSIUM: 4.5 meq/L (ref 3.7–5.3)
SODIUM: 142 meq/L (ref 137–147)
TOTAL PROTEIN: 7 g/dL (ref 6.0–8.3)
Total Bilirubin: <0.2 mg/dL — ABNORMAL LOW (ref 0.3–1.2)

## 2014-03-05 LAB — GLUCOSE, CAPILLARY
GLUCOSE-CAPILLARY: 109 mg/dL — AB (ref 70–99)
Glucose-Capillary: 136 mg/dL — ABNORMAL HIGH (ref 70–99)
Glucose-Capillary: 228 mg/dL — ABNORMAL HIGH (ref 70–99)

## 2014-03-05 LAB — URINE CULTURE

## 2014-03-05 LAB — APTT: aPTT: 29 seconds (ref 24–37)

## 2014-03-05 LAB — MRSA PCR SCREENING: MRSA by PCR: NEGATIVE

## 2014-03-05 LAB — TSH: TSH: 3.91 u[IU]/mL (ref 0.350–4.500)

## 2014-03-05 LAB — HEMOGLOBIN A1C
HEMOGLOBIN A1C: 10 % — AB (ref <5.7)
MEAN PLASMA GLUCOSE: 240 mg/dL — AB (ref <117)

## 2014-03-05 LAB — TROPONIN I: Troponin I: <0.3 ng/mL (ref <0.30)

## 2014-03-05 LAB — CBC
HCT: 41.3 % (ref 36.0–46.0)
Hemoglobin: 13.9 g/dL (ref 12.0–15.0)
MCH: 27.9 pg (ref 26.0–34.0)
MCHC: 33.7 g/dL (ref 30.0–36.0)
MCV: 82.9 fL (ref 78.0–100.0)
PLATELETS: 145 10*3/uL — AB (ref 150–400)
RBC: 4.98 MIL/uL (ref 3.87–5.11)
RDW: 15.5 % (ref 11.5–15.5)
WBC: 5.5 10*3/uL (ref 4.0–10.5)

## 2014-03-05 LAB — PROTIME-INR
INR: 0.94 (ref 0.00–1.49)
Prothrombin Time: 12.6 seconds (ref 11.6–15.2)

## 2014-03-05 SURGERY — PERMANENT PACEMAKER INSERTION
Anesthesia: LOCAL

## 2014-03-05 MED ORDER — LIDOCAINE HCL (PF) 1 % IJ SOLN
INTRAMUSCULAR | Status: AC
  Filled 2014-03-05: qty 30

## 2014-03-05 MED ORDER — SODIUM CHLORIDE 0.9 % IV SOLN
INTRAVENOUS | Status: AC
  Administered 2014-03-05: 17:00:00 via INTRAVENOUS

## 2014-03-05 MED ORDER — VANCOMYCIN HCL IN DEXTROSE 1-5 GM/200ML-% IV SOLN
1000.0000 mg | Freq: Two times a day (BID) | INTRAVENOUS | Status: AC
  Administered 2014-03-06: 1000 mg via INTRAVENOUS
  Filled 2014-03-05: qty 200

## 2014-03-05 MED ORDER — HEPARIN (PORCINE) IN NACL 2-0.9 UNIT/ML-% IJ SOLN
INTRAMUSCULAR | Status: AC
  Filled 2014-03-05: qty 500

## 2014-03-05 MED ORDER — HYDROCODONE-ACETAMINOPHEN 5-325 MG PO TABS
1.0000 | ORAL_TABLET | ORAL | Status: DC | PRN
  Administered 2014-03-05 – 2014-03-06 (×4): 2 via ORAL
  Filled 2014-03-05 (×4): qty 2

## 2014-03-05 MED ORDER — MIDAZOLAM HCL 5 MG/5ML IJ SOLN
INTRAMUSCULAR | Status: AC
  Filled 2014-03-05: qty 5

## 2014-03-05 MED ORDER — FENTANYL CITRATE 0.05 MG/ML IJ SOLN
INTRAMUSCULAR | Status: AC
  Filled 2014-03-05: qty 2

## 2014-03-05 MED ORDER — ONDANSETRON HCL 4 MG/2ML IJ SOLN
4.0000 mg | Freq: Four times a day (QID) | INTRAMUSCULAR | Status: DC | PRN
  Administered 2014-03-06: 4 mg via INTRAVENOUS
  Filled 2014-03-05: qty 2

## 2014-03-05 MED ORDER — CHLORHEXIDINE GLUCONATE 4 % EX LIQD
60.0000 mL | Freq: Once | CUTANEOUS | Status: DC
  Filled 2014-03-05: qty 60

## 2014-03-05 MED ORDER — ATROPINE SULFATE 0.1 MG/ML IJ SOLN
INTRAMUSCULAR | Status: AC
  Filled 2014-03-05: qty 10

## 2014-03-05 MED ORDER — SODIUM CHLORIDE 0.9 % IV SOLN: INTRAVENOUS | Status: DC

## 2014-03-05 MED ORDER — LIDOCAINE HCL (PF) 1 % IJ SOLN
INTRAMUSCULAR | Status: AC
  Filled 2014-03-05: qty 60

## 2014-03-05 MED ORDER — VANCOMYCIN HCL 10 G IV SOLR
1500.0000 mg | INTRAVENOUS | Status: DC
  Filled 2014-03-05: qty 1500

## 2014-03-05 MED ORDER — ACETAMINOPHEN 325 MG PO TABS: 325.0000 mg | ORAL_TABLET | ORAL | Status: DC | PRN

## 2014-03-05 MED ORDER — INSULIN ASPART 100 UNIT/ML ~~LOC~~ SOLN
0.0000 [IU] | SUBCUTANEOUS | Status: DC
  Administered 2014-03-05: 5 [IU] via SUBCUTANEOUS
  Administered 2014-03-06: 3 [IU] via SUBCUTANEOUS
  Administered 2014-03-06: 5 [IU] via SUBCUTANEOUS
  Administered 2014-03-06: 8 [IU] via SUBCUTANEOUS
  Administered 2014-03-06: 11 [IU] via SUBCUTANEOUS
  Administered 2014-03-06 (×2): 5 [IU] via SUBCUTANEOUS
  Administered 2014-03-07 (×2): 3 [IU] via SUBCUTANEOUS

## 2014-03-05 MED ORDER — SODIUM CHLORIDE 0.9 % IR SOLN
80.0000 mg | Status: DC
  Filled 2014-03-05: qty 2

## 2014-03-05 NOTE — Progress Notes (Signed)
Utilization review completed. Jerrika Ledlow, RN, BSN. 

## 2014-03-05 NOTE — CV Procedure (Signed)
Preop DX::sinus node dysfunction  Post op DX:: same  Procedure  dual pacemaker implantation  After routine prep and drape, lidocaine was infiltrated in the prepectoral subclavicular region on the left side an incision was made and carried down to later the prepectoral fascia using electrocautery and sharp dissection a pocket was formed similarly. Hemostasis was obtained.  After this, we turned our attention to gaining accessm to the extrathoracic,left subclavian vein. This was accomplished without difficulty and without the aspiration of air or puncture of the artery. 2 separate venipunctures were accomplished; guidewires were placed and retained and sequentially 7 French sheath through which were  passed an Medtronic 5076 ventricular lead serial [id number] and an Medtronic 5076  atrial lead serial number [id number] .  The ventricular lead was manipulated to the right ventricular apex with a bipolar R wave was 15.8, the pacing impedance was 850, the threshold was 1.5 @ 0.4 msec  Current at threshold was   2.0  Ma and the current of injury was  BRISK.  The right atrial lead was manipulated to the right atrial appendage with a bipolar P-wave  3.2, the pacing impedance was 682, the threshold 1.6@ 0.4 msec   Current at threshold was 2.5  Ma and the current of injury was BRISK.  The leads were affixed to the prepectoral fascia and attached to a BIOTRONIK pulse generator serial number [id number].  Hemostasis was obtained. The pocket was copiously irrigated with antibiotic containing saline solution. The leads and the pulse generator were placed in the pocket and affixed to the prepectoral fascia. The wound was then closed in 3 layers in the normal fashion.  The wound was washed dried . And a dermabond adhesive was applied   Needle  Count, sponge counts and instrument counts were correct at the end of the procedure .   The patient tolerated the procedure without apparent complication.  Gerlene Burdock.D.

## 2014-03-05 NOTE — Progress Notes (Signed)
Report received from [facility] in cath lab.  Awaiting patients arrival.  Reita Cliche 03/05/2014 5:01 PM

## 2014-03-05 NOTE — Progress Notes (Signed)
Patient refused to wear CPAP tonight. Patient aware to call RN or RT if she changes her mind.      

## 2014-03-05 NOTE — Progress Notes (Signed)
Patient arrived from cath lab.  Patient alert, oriented, verbally responsive, breathing regular and non-labored throughout, no s/s of distress noted throughout, no c/o pain throughout.  Patient and family oriented to unit and room, needs attended to.  VS WNL.  Will continue to monitor.  Reita Cliche 03/05/2014

## 2014-03-05 NOTE — Progress Notes (Signed)
[facility]  Patient Status: Active (receiving services up to time of hospitalization)  [facility] is providing the following services: RN  If patient discharges after hours, please call [phone].   [name] 03/05/2014, 10:27 AM

## 2014-03-05 NOTE — Progress Notes (Signed)
Inpatient Diabetes Program Recommendations  AACE/ADA: New Consensus Statement on Inpatient Glycemic Control (2013)  Target Ranges:  Prepandial:   less than 140 mg/dL      Peak postprandial:   less than 180 mg/dL (1-2 hours)      Critically ill patients:  140 - 180 mg/dL  Results for MEGAHN, KILLINGS (MRN [id number]) as of 03/05/2014 11:51  Ref. Range 03/04/2014 22:31 03/05/2014 07:44  Glucose-Capillary Latest Range: 70-99 mg/dL 017 (H) 793 (H)    Inpatient Diabetes Program Recommendations Insulin - Basal: consider a lower dose of Levemir while NPO (would not recommend full home dose at this time) Correction (SSI): change to Q4 while NPO  Note: This coordinator spoke with patient about current insulin regimen.  Pt reports that she now takes Levemir 185 units in the morning.  She also reports that she took her usual dose the morning of 7/15.  Her fasting glucose was 109 this morning which she feels is unusually low for her.  Recommend holding Levemir for now and use Novolog correction Q4 until after procedure.  Continue to monitor CBGs Q4 and add a portion of patient's Levemir after procedure.   Will follow. Thank you  Piedad Climes BSN, RN,CDE Inpatient Diabetes Coordinator [phone] (team pager)

## 2014-03-05 NOTE — Consult Note (Addendum)
ELECTROPHYSIOLOGY CONSULT NOTE  Patient ID: [name], MRN: [id number], DOB/AGE: 53-Sep-1962 53 y.o. Admit date: 03/04/2014 Date of Consult: 03/05/2014  Primary Physician: [name], MD Primary Cardiologist: new  Chief Complaint: sinus bradycardia   HPI [name] is a 53 y.o. female  Admitted with symptomatic bradycardia.. Nted by [facility] after pt complained of seated lightheadedness to have HR 44 and then few minues later HR mid 30s   [facility]ought to [facility] and had documented ECG HR whiie awake 47 and 37 with  Normal intervals  This has been increasingly frequent of late with one episode of presyncope following use of commode, but frequent episodes of mild lightheadedness  She is s/p RBKA and uses prosthesis, functional status remains stable  She has  A non healing wound on left foot but no systemic symptoms to suggest infection  She has been on clinda  Family hx notable for many pacemakers and ? Hypertrophic cardiomyopathy  No known CAD does not recall prior function or anatomic assessment of CA;  No prior MI  No palpitations  Echo 9/14  >> normal LV function with mild LVH  No evidence of HCM  Past Medical History  Diagnosis Date  . Diabetic neuropathy   . Cellulitis   . Thyroid disease   . Hyperlipidemia   . Heart murmur   . Neuromuscular disorder   . Concussion as a teenager    slight  . Diarrhea     pt takes Reglan tid  . Hypothyroidism     takes Synthroid daily  . Depression     takes CYmbalta daily  . Peripheral edema     takes Lasix daily  . MRSA (methicillin resistant staph aureus) culture positive     hx of 2012  . Staph infection 2007  . Complication of anesthesia 01/2013    "didn't know where I was; who I was; talking out the top of my head" (May 07, 2013)  . Hypertension   . Peripheral vascular disease   . CHF (congestive heart failure)   . Pneumonia 1980's    "hospitalized w/it" (2013-05-07)  . Chronic bronchitis   . Orthopnea     "just one;  yesterday morning" (2013-05-07)  . Type II diabetes mellitus     Novolog,Levemir,and Lovaza daily  . GERD (gastroesophageal reflux disease)   . Daily headache   . Rheumatoid arthritis     "both knees" (05-07-13)  . Anxiety   . Kidney stone     "passed on before" (05-07-13)  . Non-healing wound of lower extremity     lt foot  . OSA on CPAP     and oxygen  . COPD (chronic obstructive pulmonary disease)     wear oxygen at home  . Symptomatic bradycardia 03/04/2014      Surgical History:  Past Surgical History  Procedure Laterality Date  . Amputation    . Leg amputation below knee Right 2012  . Toe amputation Left ?2011     only 2 toes remaning.   . Tubal ligation  1990's  . Foot surgery Right 2012    "put pins in one year; amputated toes another OR" (May 07, 2013)  . Wrist surgery Right 1980's    "pinched nerve" (05/07/13)  . Dilation and curettage of uterus  1980's  . Refractive surgery Bilateral 2014  . Incision and drainage abscess N/A 01/24/2013    Procedure: INCISION AND DRAINAGE ABSCESS;  Surgeon: Clovis Pu. Cornett, MD;  Location: [facility] OR;  Service: General;  Laterality: N/A;  . Inguinal hernia repair N/A 01/28/2013    Procedure: I&D Right Groin Wound;  Surgeon: Shelly Rubenstein, MD;  Location: [facility] OR;  Service: General;  Laterality: N/A;  . Ganglion cyst excision Left 1990's    "wrist" (04/21/2013)  . Bladder surgery  1970's    "stretched the mouth of my bladder" (04/21/2013)  . I&d extremity Left 08/25/2013    Procedure: IRRIGATION AND DEBRIDEMENT EXTREMITY;  Surgeon: Axel Filler, MD;  Location: [facility] OR;  Service: General;  Laterality: Left;     Home Meds: Prior to Admission medications   Medication Sig Start Date End Date Taking? Authorizing Provider  albuterol (PROVENTIL HFA;VENTOLIN HFA) 108 (90 BASE) MCG/ACT inhaler Inhale 2 puffs into the lungs every 6 (six) hours as needed for wheezing or shortness of breath.   Yes Historical Provider, MD  amLODipine (NORVASC) 10 MG  tablet Take 10 mg by mouth daily.   Yes Historical Provider, MD  aspirin EC 81 MG tablet Take 81 mg by mouth daily.   Yes Historical Provider, MD  atorvastatin (LIPITOR) 80 MG tablet Take 80 mg by mouth daily.   Yes Historical Provider, MD  FLUoxetine (PROZAC) 20 MG tablet Take 20 mg by mouth 2 (two) times daily.    Yes Historical Provider, MD  Fluticasone Furoate-Vilanterol (BREO ELLIPTA) 100-25 MCG/INH AEPB Inhale 1 puff into the lungs daily.   Yes Historical Provider, MD  furosemide (LASIX) 20 MG tablet Take 20 mg by mouth daily.   Yes Historical Provider, MD  gabapentin (NEURONTIN) 100 MG capsule Take 100 mg by mouth 2 (two) times daily.   Yes Historical Provider, MD  insulin aspart (NOVOLOG) 100 UNIT/ML injection Inject 36-48 Units into the skin 3 (three) times daily with meals. *per sliding scale   Yes Historical Provider, MD  insulin detemir (LEVEMIR) 100 UNIT/ML injection Inject 140 Units into the skin every morning. *uses 70 units on each side   Yes Historical Provider, MD  levothyroxine (SYNTHROID, LEVOTHROID) 25 MCG tablet Take 25 mcg by mouth every morning.    Yes Historical Provider, MD  metoCLOPramide (REGLAN) 5 MG tablet Take 5 mg by mouth 3 (three) times daily.    Yes Historical Provider, MD  omeprazole (PRILOSEC) 20 MG capsule Take 20 mg by mouth daily.   Yes Historical Provider, MD  oxybutynin (DITROPAN) 5 MG tablet Take 5 mg by mouth every morning.     Yes Historical Provider, MD  tiotropium (SPIRIVA) 18 MCG inhalation capsule Place 18 mcg into inhaler and inhale daily.   Yes Historical Provider, MD    Inpatient Medications:  . aspirin EC  81 mg Oral Daily  . atorvastatin  80 mg Oral q1800  . atropine      . budesonide-formoterol  2 puff Inhalation BID  . docusate sodium  100 mg Oral BID  . FLUoxetine  20 mg Oral BID  . furosemide  20 mg Oral Daily  . gabapentin  100 mg Oral BID  . heparin  5,000 Units Subcutaneous 3 times per day  . insulin aspart  0-15 Units  Subcutaneous TID WC  . insulin aspart  0-5 Units Subcutaneous QHS  . insulin detemir  100 Units Subcutaneous Daily  . levothyroxine  25 mcg Oral QAC breakfast  . oxybutynin  5 mg Oral Daily  . pantoprazole  40 mg Oral Daily  . sodium chloride  3 mL Intravenous Q12H  . tiotropium  18 mcg Inhalation Daily     Allergies:  Allergies  Allergen Reactions  . Gadolinium      Code: VOM, Desc: Pt began vomiting immed post infusion of multihance, Onset Date: [id number]   . Ivp Dye [Iodinated Diagnostic Agents] Nausea And Vomiting  . Metformin Other (See Comments)     diarrhea  . Penicillins Hives    History   Social History  . Marital Status: Single    Spouse Name: N/A    Number of Children: N/A  . Years of Education: N/A   Occupational History  . Not on file.   Social History Main Topics  . Smoking status: Current Every Day Smoker -- 1.00 packs/day for 33 years    Types: Cigarettes  . Smokeless tobacco: Never Used  . Alcohol Use: No  . Drug Use: No  . Sexual Activity: Yes   Other Topics Concern  . Not on file   Social History Narrative  . No narrative on file     Family History  Problem Relation Age of Onset  . Hypertension Mother   . Hyperlipidemia Mother   . Hypertrophic cardiomyopathy Mother     has pacer  . Hypertension Brother   . Hyperlipidemia Brother   . Cancer Maternal Aunt   . Diabetes Paternal Aunt   . Diabetes Paternal Uncle   . Diabetes Paternal Grandmother   . Anesthesia problems Neg Hx   . Hypotension Neg Hx   . Malignant hyperthermia Neg Hx   . Pseudochol deficiency Neg Hx   . Alzheimer's disease Father      ROS:  Please see the history of present illness.   Negative except as noted in H and P  All other systems reviewed and negative.    Physical Exam:   Blood pressure 137/41, pulse 57, temperature 97.8 F (36.6 C), temperature source Oral, resp. rate 11, height 5\' 5"  (1.651 m), weight 263 lb 10.7 oz (119.6 kg), SpO2 98.00%. General: Well  developed, well nourished female in no acute distress. Head: Normocephalic, atraumatic, sclera non-icteric, no xanthomas, nares are without discharge. EENT: normal Lymph Nodes:  none Back: without scoliosis/kyphosis *, no CVA tendersness Neck: Negative for carotid bruits. JVD not elevated. Lungs: Clear bilaterally to auscultation without wheezes, rales, or rhonchi. Breathing is unlabored. Heart: RRR with S1 S2. No murmur , rubs, or gallops appreciated. Abdomen: Soft, non-tender, non-distended with normoactive bowel sounds. No hepatomegaly. No rebound/guarding. No obvious abdominal masses. Msk:  Strength and tone appear normal for age. Extremities: No clubbing or cyanosis. No*  edema.  Distal pedal pulses are 2+ and equal bilaterally. S/p RBKA   Left foot bandaged Skin: Warm and Dry Neuro: Alert and oriented X 3. CN III-XII intact Grossly normal sensory and motor function . Psych:  Responds to questions appropriately with a normal affect.      Labs: Cardiac Enzymes  Recent Labs  03/04/14 1634 03/04/14 2333 03/05/14 0305  TROPONINI <0.30 <0.30 <0.30   CBC Lab Results  Component Value Date   WBC 5.5 03/05/2014   HGB 13.9 03/05/2014   HCT 41.3 03/05/2014   MCV 82.9 03/05/2014   PLT 145* 03/05/2014   PROTIME:  Recent Labs  03/05/14 0305  LABPROT 12.6  INR 0.94   Chemistry  Recent Labs Lab 03/05/14 0305  NA 142  K 4.5  CL 100  CO2 26  BUN 29*  CREATININE 1.14*  CALCIUM 9.0  PROT 7.0  BILITOT <0.2*  ALKPHOS 127*  ALT 23  AST 25  GLUCOSE 148*   Lipids Lab Results  Component Value  Date   CHOL 254* 07/01/2009   HDL 33* 07/01/2009   LDLCALC See Comment mg/dL 62/94/7654   TRIG 650* 07/01/2009   BNP Pro B Natriuretic peptide (BNP)  Date/Time Value Ref Range Status  03/04/2014  4:34 PM 501.5* 0 - 125 pg/mL Final  08/25/2013  7:22 AM 2051.0* 0 - 125 pg/mL Final  04/20/2013  3:10 PM 546.0* 0 - 125 pg/mL Final  03/20/2010  1:53 PM 110.0* 0.0 - 100.0 pg/mL Final    Miscellaneous Lab Results  Component Value Date   DDIMER 0.38 04/20/2013    Radiology/Studies:  Dg Chest 2 View  03/04/2014   CLINICAL DATA:  Bradycardia. Dizziness. Weakness. Diabetes and hypertension.  EXAM: CHEST  2 VIEW  COMPARISON:  08/24/2013  FINDINGS: The heart size and mediastinal contours are within normal limits. Both lungs are clear. The visualized skeletal structures are unremarkable.  IMPRESSION: No active cardiopulmonary disease.   Electronically Signed   By: Myles Rosenthal M.D.   On: 03/04/2014 18:21    EKG: Sinus rates 37-68 20/10/48   Assessment and Plan:  Symptomatic sinus bradycardia  Fhx of HCM  DM  OSA   Pt with symptomatic bradycardia HR>>35  While awake on no slowing drugs.. This has been progressive problem and appropriately referred for pacing The benefits and risks were reviewed including but not limited to death,  perforation, infection, lead dislodgement and device malfunction.  The patient understands agrees and is willing to proceed. ECho >>no hcm  Will get IM to assist with diabetes  Sherryl Manges

## 2014-03-05 NOTE — Consult Note (Signed)
WOC wound consult note Reason for Consult: pt with neuropathic foot ulcer. She is followed by Dr. Romualdo Bolk in [facility] (podiatrist)  She wears offloading boot and has a HHRN QOD for silver antimicrobial dressing. She has hx of amputation on the right and has significant neuropathy on the left. Wound type:neuropathic foot ulcer left plantar surface Measurement:1.0cm x 1.0cm x 0.5cm  Wound bed: unable to really visualize due to the chronic use of silver dressing which has stained the base.   Drainage (amount, consistency, odor) none Periwound:hyperkeratosis  Dressing procedure/placement/frequency: pack small strip of silver hydrofiber into the wound bed every other day. Wrap with kelix. Offloading boot to be worn if up on the leg.  Discussed POC with patient and bedside nurse.  Re consult if needed, will not follow at this time. Thanks  Trygg Mantz [employer], CWOCN [phone])

## 2014-03-05 NOTE — Progress Notes (Signed)
Placed pt on CPAP as per order. Pt  Tolerating well at this time.

## 2014-03-05 NOTE — Progress Notes (Signed)
  Echocardiogram 2D Echocardiogram has been performed.  Kenya Shiraishi 03/05/2014, 11:05 AM

## 2014-03-05 NOTE — Progress Notes (Signed)
CTSP due to 2 pauses.  One was 4 seconds and the other was 5.5 sec.  Patient was asymptomatic and sleeping at the time.  HR currently sinus bradycardia at 57bpm.  Will continue to monitor.

## 2014-03-06 ENCOUNTER — Inpatient Hospital Stay (HOSPITAL_COMMUNITY): Payer: Medicare HMO

## 2014-03-06 DIAGNOSIS — E119 Type 2 diabetes mellitus without complications: Secondary | ICD-10-CM

## 2014-03-06 DIAGNOSIS — J449 Chronic obstructive pulmonary disease, unspecified: Secondary | ICD-10-CM

## 2014-03-06 LAB — GLUCOSE, CAPILLARY
GLUCOSE-CAPILLARY: 175 mg/dL — AB (ref 70–99)
GLUCOSE-CAPILLARY: 216 mg/dL — AB (ref 70–99)
GLUCOSE-CAPILLARY: 235 mg/dL — AB (ref 70–99)
Glucose-Capillary: 199 mg/dL — ABNORMAL HIGH (ref 70–99)
Glucose-Capillary: 224 mg/dL — ABNORMAL HIGH (ref 70–99)
Glucose-Capillary: 280 mg/dL — ABNORMAL HIGH (ref 70–99)
Glucose-Capillary: 301 mg/dL — ABNORMAL HIGH (ref 70–99)

## 2014-03-06 MED ORDER — INSULIN DETEMIR 100 UNIT/ML ~~LOC~~ SOLN
30.0000 [IU] | Freq: Two times a day (BID) | SUBCUTANEOUS | Status: DC
  Administered 2014-03-06 – 2014-03-07 (×3): 30 [IU] via SUBCUTANEOUS
  Filled 2014-03-06 (×4): qty 0.3

## 2014-03-06 NOTE — Progress Notes (Signed)
Patient refused to wear CPAP tonight. Patient aware to call RN or RT if she changes her mind.

## 2014-03-06 NOTE — Evaluation (Signed)
Physical Therapy Evaluation Patient Details Name: [name] MRN: [id number] DOB: 01-27-1961 Today's Date: 03/06/2014   History of Present Illness  Patient is a 53 y/o female found to be bradycardic at home by [facility] with pulse of 37 bpm. S/p dual pacemaker implantation 7/16. PMH positive for diabetic neuropathy, cellutlis, heart murmur, depression, CJF, PVD, PNA, HTN, MRSA, non healing ulcer L foot, R BKA, COPD.   Clinical Impression  Patient presents with functional limitations as listed in PT problem list. Educated pt on pacemaker precautions and importance of minimizing mobility of LUE however pt demonstrates/verbalizes non compliance. "I need to use my arm, I am left handed." Pt's mobility is limited by pain, weakness and wounds on right stump/left foot (per report, pt not to be wearing prosthesis or ambulating). Pt has no caregiver support at home. Concerned how patient will safely enter apt as pt has steps to negotiate. Pt refusing SNF placement at this time however not safe to return home alone. Pt would benefit from skilled therapy in acute setting to maximize independence and safely improve mobility prior to discharge.   Follow Up Recommendations Supervision/Assistance - 24 hour;Home health PT (Pt refusing SNF placement. )    Equipment Recommendations  Wheelchair (measurements PT);Wheelchair cushion (measurements PT)    Recommendations for Other Services OT consult     Precautions / Restrictions Precautions Precautions: ICD/Pacemaker;Fall Precaution Comments: Instructed pt on pacemaker precautions, however during session pt non compliant. Pt not in LUE sling upon arrival. Reports getting up on left side of bed on surgical side. Refusing to wear sling and actively mobilizing LUE even after constant cues to refrain from mobilizing LUE. Required Braces or Orthoses: Sling Other Brace/Splint: LUE. Post op boot for LLE. Prosthesis for RLE. Restrictions Other Position/Activity  Restrictions: Pt encouraged not to mobilize LUE.      Mobility  Bed Mobility Overal bed mobility: Needs Assistance Bed Mobility: Supine to Sit     Supine to sit: Min guard;HOB elevated     General bed mobility comments: with handrails.  Transfers Overall transfer level: Needs assistance Equipment used: None Transfers: Sit to/from [employer] Sit to Stand: Min guard Stand pivot transfers: Min guard       General transfer comment: Stood x2 from EOB without AD. Pt would not let therapist assist with donning prosthesis, 'there is a specific way to do it, I need to do it." Encouraged not to donn prosthesis due to using LUE however pt continued to do so. SPT to [facility] Min guard assist for safety.  Ambulation/Gait Ambulation/Gait assistance: Min guard Ambulation Distance (Feet): 10 Feet Assistive device: None Gait Pattern/deviations: Step-to pattern;Decreased stride length   Gait velocity interpretation: Below normal speed for age/gender General Gait Details: Pt holding onto furniture in room to ambulate for support. Unsteady. Pt reports she is supposed to do minimal ambulation at this time until wound heels on R stump. Non compliant as pt reports she needs to walk up steps to enter home and to get into bathroom. Questionable historian?  Stairs            Wheelchair Mobility    Modified Rankin (Stroke Patients Only)       Balance Overall balance assessment: Needs assistance   Sitting balance-[name]: Good     Standing balance support: During functional activity Standing balance-[name]: Poor Standing balance comment: Requires assist for mobility to maintain balance.  Pertinent Vitals/Pain Reports pain at surgical site on L chest. RN made aware of pain, pt repositioned with pillows for comfort. Sa02 ranged from 90%-96% during session.    Home Living Family/patient expects to be discharged to:: Private  residence Living Arrangements: Alone Available Help at Discharge: Family;Available PRN/intermittently;Friend(s) Type of Home: Apartment Home Access: Stairs to enter Entrance Stairs-Rails: None Entrance Stairs-Number of Steps: 3 Home Layout: One level Home Equipment: Walker - 2 wheels;Cane - single point;Bedside commode;Wheelchair - manual;Tub bench;Hand held shower head Additional Comments: Pt reports wheel fell off w/c.    Prior Function Level of Independence: Independent with assistive device(s)         Comments: Pt's family assists with grocery shopping. Pt reports (I) with ADLs. Uses w/c for mobility recently as pt told not to ambulate with prosthesis secondary to wound on R stump.     Hand Dominance        Extremity/Trunk Assessment   Upper Extremity Assessment: LUE deficits/detail;RUE deficits/detail RUE Deficits / Details: AROM/strength WFL.     LUE Deficits / Details: Pt mobilizing LUE when therapist entering room. Instructed pt to minimize all mobility of LUE for healing purposes. Pt non compliant. "I need this arm, I am left handed."   Lower Extremity Assessment: Generalized weakness;LLE deficits/detail;RLE deficits/detail RLE Deficits / Details: Stump AROM/strength WFL. LLE Deficits / Details: Diminished light touch sensation. Generalized weakness noted secondary to hospitalization.     Communication   Communication: No difficulties  Cognition Arousal/Alertness: Lethargic Behavior During Therapy: WFL for tasks assessed/performed Overall Cognitive Status: Within Functional Limits for tasks assessed       Memory: Decreased recall of precautions (Unwillingly/unable to recall precautions for s/p pacemaker placement.)              General Comments General comments (skin integrity, edema, etc.): Wound present on R stump. LLE wound not visible.    Exercises        Assessment/Plan    PT Assessment Patient needs continued PT services  PT Diagnosis  Acute pain;Generalized weakness;Difficulty walking   PT Problem List Decreased strength;Decreased activity tolerance;Decreased safety awareness;Decreased skin integrity;Decreased balance;Decreased knowledge of precautions;Pain;Decreased mobility;Decreased coordination;Impaired sensation  PT Treatment Interventions DME instruction;Balance training;Gait training;Neuromuscular re-education;Stair training;Patient/family education;Therapeutic activities;Therapeutic exercise;Functional mobility training   PT Goals (Current goals can be found in the Care Plan section) Acute Rehab PT Goals Patient Stated Goal: to go home  PT Goal Formulation: With patient Time For Goal Achievement: 03/20/14 Potential to Achieve Goals: Good    Frequency Min 3X/week   Barriers to discharge Decreased caregiver support Pt lives alone.    Co-evaluation               End of Session Equipment Utilized During Treatment: Gait belt;Other (comment) (Sling doned LUE until patient ripped it off post transfer. Post op boot on LLE,.) Activity Tolerance: Patient limited by pain;Patient limited by fatigue Patient left: in chair;with call bell/phone within reach Nurse Communication: Mobility status;Precautions         Time: 1450-1520 PT Time Calculation (min): 30 min   Charges:   PT Evaluation $Initial PT Evaluation Tier I: 1 Procedure PT Treatments $Therapeutic Activity: 8-22 mins   PT G CodesAlvie Heidelberg A 03/06/2014, 3:38 PM Alvie Heidelberg, PT, DPT [phone]

## 2014-03-06 NOTE — Progress Notes (Signed)
CARDIAC REHAB PHASE I   PRE:  Rate/Rhythm: 61 PAcing  BP:  Supine: 110/70  Sitting: 132/70  Standing:    SaO2: 93 RA  MODE:  Ambulation: few steps ft   POST:  Rate/Rhythm: 67  BP:  Supine: 140/80  Sitting:   Standing:    SaO2: 91 RA 0900-0940 On arrival pt in bed sleeping, aroused easily. She c/o of no sleep last night and is tired. Sat pt up on side of bed, she continues very reluctant to walk or move. States that she feels nauseated. Helped pt apply her prosthesis. She also c/o of feeling dizzy sitting on side of bed, checked BP with her sitting BP stable. Assisted X 2 and used walker to attempt ambulation. Pt took only a few steps inside the room and she turned around and went to bed. We had recliner ready for her to sit in and she refused states, "I am sick and I am going to bed." Pt states that she only walks inside the house, mostly. She admits to using her w/c most of the time. We are signing off and would recommend a Physical Therapy consult, she is not appro for our service. Pt with multiple c/o this morning of how bad she feels and that she should not walk.  Melina Copa RN 03/06/2014 9:38 AM

## 2014-03-06 NOTE — Progress Notes (Signed)
ELECTROPHYSIOLOGY ROUNDING NOTE    Patient Name: [name] Date of Encounter: 03/06/2014    SUBJECTIVE:Status post pacemaker implant 03-05-14.  No chest pain or shortness of breath.  Moderate incisional soreness.  She is concerned about her mobility at discharge and does not feel ready to go home today.   TELEMETRY: Reviewed telemetry pt in sinus rhythm with intermittent atrial pacing Filed Vitals:   03/05/14 1735 03/05/14 1756 03/05/14 2100 03/06/14 0426  BP: 132/60 142/67 147/55 138/59  Pulse: 60  62 63  Temp:   98.1 F (36.7 C) 97.8 F (36.6 C)  TempSrc:   Oral Oral  Resp:  12 16 16   Height:      Weight:      SpO2:  91% 90% 90%    Intake/Output Summary (Last 24 hours) at 03/06/14 0704 Last data filed at 03/05/14 2200  Gross per 24 hour  Intake    440 ml  Output    200 ml  Net    240 ml    CURRENT MEDICATIONS: . aspirin EC  81 mg Oral Daily  . atorvastatin  80 mg Oral q1800  . budesonide-formoterol  2 puff Inhalation BID  . docusate sodium  100 mg Oral BID  . FLUoxetine  20 mg Oral BID  . furosemide  20 mg Oral Daily  . gabapentin  100 mg Oral BID  . heparin  5,000 Units Subcutaneous 3 times per day  . insulin aspart  0-15 Units Subcutaneous 6 times per day  . levothyroxine  25 mcg Oral QAC breakfast  . oxybutynin  5 mg Oral Daily  . pantoprazole  40 mg Oral Daily  . sodium chloride  3 mL Intravenous Q12H  . tiotropium  18 mcg Inhalation Daily    LABS: Basic Metabolic Panel:  Recent Labs  03/07/14 1634 03/05/14 0305  NA 141 142  K 4.5 4.5  CL 99 100  CO2 26 26  GLUCOSE 183* 148*  BUN 29* 29*  CREATININE 1.12* 1.14*  CALCIUM 9.5 9.0   Liver Function Tests:  Recent Labs  03/05/14 0305  AST 25  ALT 23  ALKPHOS 127*  BILITOT <0.2*  PROT 7.0  ALBUMIN 2.8*   CBC:  Recent Labs  03/04/14 1634 03/05/14 0305  WBC 7.3 5.5  NEUTROABS 3.6  --   HGB 14.3 13.9  HCT 42.8 41.3  MCV 83.4 82.9  PLT 190 145*   Cardiac Enzymes:  Recent  Labs  03/04/14 2333 03/05/14 0305 03/05/14 1025  TROPONINI <0.30 <0.30 <0.30   Hemoglobin A1C:  Recent Labs  03/05/14 0305  HGBA1C 10.0*   Thyroid Function Tests:  Recent Labs  03/04/14 2333  TSH 3.910     Radiology/Studies:  Final result pending, leads in stable position.  PHYSICAL EXAM Well developed and nourished in no acute distress HENT normal Neck supple with JVP-flat Clear Pocket without hematoma Regular rate and rhythm, no murmurs or gallops Abd-soft with active BS No Clubbing cyanosis edema Skin-warm and dry A & Oriented  Grossly normal sensory and motor function   DEVICE INTERROGATION: Device interrogation pending  Principal Problem:   Symptomatic bradycardia Active Problems:   Diabetes mellitus type 2, uncontrolled, with complications   TOBACCO ABUSE   HYPERCHOLESTEROLEMIA   Hypothyroidism   Chronic diastolic heart failure   OSA on CPAP with oxygen   COPD (chronic obstructive pulmonary disease), on home O2   Bradycardia   Wound care, restrictions reviewed with patient.  Routine follow up scheduled.  Discharge in am Will need careful attention to her dabetes

## 2014-03-06 NOTE — Progress Notes (Signed)
Per Melina Copa, Cardiac Rehab, patient not a candidate for cardiac rehab and needs to be seen by physical therapy.  Gypsy Balsam, NP notified; new order received.  Will continue to monitor.

## 2014-03-06 NOTE — Progress Notes (Signed)
Left anterior upper chest PPM site assessed.  Dressing D/I, no redness or oozing, miniscule bruising, pt states she has soreness at site but discomfort is tolerable, 2/10. Reviewed signs of infection to watch for  / pt verbalized understanding and will call Dr Odessa Fleming office with questions or concerns.

## 2014-03-06 NOTE — Progress Notes (Signed)
Inpatient Diabetes Program Recommendations  AACE/ADA: New Consensus Statement on Inpatient Glycemic Control (2013)  Target Ranges:  Prepandial:   less than 140 mg/dL      Peak postprandial:   less than 180 mg/dL (1-2 hours)      Critically ill patients:  140 - 180 mg/dL     Results for AMRIT, ERCK (MRN [id number]) as of 03/06/2014 08:38  Ref. Range 03/06/2014 00:00 03/06/2014 04:39 03/06/2014 07:42  Glucose-Capillary Latest Range: 70-99 mg/dL 888 (H) 757 (H) 972 (H)     CBGs on the rise since midnight.  No basal insulin ordered at present.  Spoke with Dr. Graciela Husbands by phone to discuss.  Dr. Graciela Husbands gave me orders to start 1/3 home dose of Levemir- Levemir 30 units bid start this AM.  Orders placed into EPIC.  Called RN to alert to new orders.   Will follow Ambrose Finland RN, MSN, CDE Diabetes Coordinator Inpatient Diabetes Program Team Pager: [phone] (8a-10p)

## 2014-03-06 NOTE — Discharge Summary (Signed)
ELECTROPHYSIOLOGY PROCEDURE DISCHARGE SUMMARY    Patient ID: [name],  MRN: [id number], DOB/AGE: [birth year] 53 y.o.  Admit date: 03/04/2014 Discharge date: 03/06/2014  Primary Care Physician: [name], MD Primary Cardiologist: Graciela Husbands  Primary Discharge Diagnosis:  Symptomatic bradycardia status post pacemaker implantation this admission  Secondary Discharge Diagnosis:  1.  Diabetes 2.  Hyperlipidemia 3.  Hypothyroidism 4.  COPD 5.  PVD s/p left BKA  Allergies  Allergen Reactions  . Gadolinium      Code: VOM, Desc: Pt began vomiting immed post infusion of multihance, Onset Date: [id number]   . Ivp Dye [Iodinated Diagnostic Agents] Nausea And Vomiting  . Metformin Other (See Comments)     diarrhea  . Penicillins Hives     Procedures This Admission:  1.  Implantation of a dual chamber pacemaker on 03-05-14 by Dr Graciela Husbands.  The patient received a Biotronik dual chamber pacemaker.  See op note for full details.  There were no early apparent complications.  2.  CXR on 03-06-14 demonstrated no ptx status post device implant.  3.  Echocardiogram 03-05-14 demonstrated normal EF, grade 2 diastolic dysfunction, no significant valvular abnormalities.   Brief HPI/Hospital Course:  [name] is a 53 y.o. female with a past medical history as outlined above.  She has had progressive exercise interolance and periods of pre-syncope.  Her HHRN found her heart rate to be in the 30's and referred her to the hospital for further evaluation.  She was evaluated by EP and there were no reversible causes for bradycardia found. Risks, benefits, and alternatives to pacemaker implantation were reviewed with the patient who wished to proceed. The patient underwent implantation of a Biotronik dual chamber pacemaker with details as outlined above.   She was monitored on telemetry overnight which demonstrated atrial pacing with intrinsic ventricular conduction.  Left chest was without  hematoma or ecchymosis.  The device was interrogated and found to be functioning normally.  CXR was obtained and demonstrated no pneumothorax status post device implantation.  Due to left BKA and poor home support, she was evaluated by PT who recommended SNF; however, the patient refused.  Home health PT will be ordered at discharge. Wound care, arm mobility, and restrictions were reviewed with the patient.  Dr Ladona Ridgel examined the patient and considered them stable for discharge to home.    Discharge Vitals: Blood pressure 126/54, pulse 73, temperature 97.8 F (36.6 C), temperature source Oral, resp. rate 16, height 5\' 5"  (1.651 m), weight 263 lb 10.7 oz (119.6 kg), SpO2 94.00%.   Labs:   Lab Results  Component Value Date   WBC 5.5 03/05/2014   HGB 13.9 03/05/2014   HCT 41.3 03/05/2014   MCV 82.9 03/05/2014   PLT 145* 03/05/2014    Recent Labs Lab 03/05/14 0305  NA 142  K 4.5  CL 100  CO2 26  BUN 29*  CREATININE 1.14*  CALCIUM 9.0  PROT 7.0  BILITOT <0.2*  ALKPHOS 127*  ALT 23  AST 25  GLUCOSE 148*      Discharge Medications:    Medication List    ASK your doctor about these medications       albuterol 108 (90 BASE) MCG/ACT inhaler  Commonly known as:  PROVENTIL HFA;VENTOLIN HFA  Inhale 2 puffs into the lungs every 6 (six) hours as needed for wheezing or shortness of breath.     amLODipine 10 MG tablet  Commonly known as:  NORVASC  Take 10  mg by mouth daily.     aspirin EC 81 MG tablet  Take 81 mg by mouth daily.     atorvastatin 80 MG tablet  Commonly known as:  LIPITOR  Take 80 mg by mouth daily.     BREO ELLIPTA 100-25 MCG/INH Aepb  Generic drug:  Fluticasone Furoate-Vilanterol  Inhale 1 puff into the lungs daily.     FLUoxetine 20 MG tablet  Commonly known as:  PROZAC  Take 20 mg by mouth 2 (two) times daily.     furosemide 20 MG tablet  Commonly known as:  LASIX  Take 20 mg by mouth daily.     gabapentin 100 MG capsule  Commonly known as:   NEURONTIN  Take 100 mg by mouth 2 (two) times daily.     insulin detemir 100 UNIT/ML injection  Commonly known as:  LEVEMIR  Inject 140 Units into the skin every morning. *uses 70 units on each side     levothyroxine 25 MCG tablet  Commonly known as:  SYNTHROID, LEVOTHROID  Take 25 mcg by mouth every morning.     metoCLOPramide 5 MG tablet  Commonly known as:  REGLAN  Take 5 mg by mouth 3 (three) times daily.     NOVOLOG 100 UNIT/ML injection  Generic drug:  insulin aspart  Inject 36-48 Units into the skin 3 (three) times daily with meals. *per sliding scale     omeprazole 20 MG capsule  Commonly known as:  PRILOSEC  Take 20 mg by mouth daily.     oxybutynin 5 MG tablet  Commonly known as:  DITROPAN  Take 5 mg by mouth every morning.     tiotropium 18 MCG inhalation capsule  Commonly known as:  SPIRIVA  Place 18 mcg into inhaler and inhale daily.        Disposition:     Duration of Discharge Encounter: less than 30 minutes including physician time.  Signed,  Lewayne Bunting, M.D.

## 2014-03-07 LAB — GLUCOSE, CAPILLARY
GLUCOSE-CAPILLARY: 182 mg/dL — AB (ref 70–99)
GLUCOSE-CAPILLARY: 166 mg/dL — AB (ref 70–99)

## 2014-03-07 NOTE — Discharge Instructions (Signed)
° ° °  Supplemental Discharge Instructions for  Pacemaker/Defibrillator Patients  Activity No heavy lifting or vigorous activity with your left/right arm for 6 to 8 weeks.  Do not raise your left/right arm above your head for one week.  Gradually raise your affected arm as drawn below.              03/07/14                    7/19                    7/20                            7/21 __  NO DRIVING for 1 week. You may begin driving on 1/55  WOUND CARE   Keep the wound area clean and dry.  Do not get this area wet for one week. No showers for one week; you may shower on     .   The tape/steri-strips on your wound will fall off; do not pull them off.  No bandage is needed on the site.  DO  NOT apply any creams, oils, or ointments to the wound area.   If you notice any drainage or discharge from the wound, any swelling or bruising at the site, or you develop a fever > 101? F after you are discharged home, call the office at once.  Special Instructions   You are still able to use cellular telephones; use the ear opposite the side where you have your pacemaker/defibrillator.  Avoid carrying your cellular phone near your device.   When traveling through airports, show security personnel your identification card to avoid being screened in the metal detectors.  Ask the security personnel to use the hand wand.   Avoid arc welding equipment, MRI testing (magnetic resonance imaging), TENS units (transcutaneous nerve stimulators).  Call the office for questions about other devices.   Avoid electrical appliances that are in poor condition or are not properly grounded.   Microwave ovens are safe to be near or to operate.  Additional information for defibrillator patients should your device go off:   If your device goes off ONCE and you feel fine afterward, notify the device clinic nurses.   If your device goes off ONCE and you do not feel well afterward, call 911.   If your device goes off TWICE, call  911.   If your device goes off THREE times in one day, call 911.  DO NOT DRIVE YOURSELF OR A FAMILY MEMBER WITH A DEFIBRILLATOR TO THE HOSPITAL--CALL 911.

## 2014-03-10 ENCOUNTER — Telehealth: Payer: Self-pay | Admitting: Internal Medicine

## 2014-03-10 NOTE — Telephone Encounter (Addendum)
Pt tells me site is stinging, burning, aching - which is unchanged since surgery. Explained that we normally don't give rx pain med post procedure. Advised to take OTC Tylenol, regular. If not improved by Thursday morning to call me so that we may address again. Pt denies fever, swelling at implant site, redness, drainage. Pt currently on abx for foot callous. Pt agreeable to calling back if problem continues.

## 2014-03-10 NOTE — Telephone Encounter (Signed)
New message   Patient calling asking for something for pain. S/p pacemaker inserted last Thursday.

## 2014-03-13 ENCOUNTER — Telehealth: Payer: Self-pay | Admitting: Internal Medicine

## 2014-03-13 NOTE — Telephone Encounter (Signed)
Spoke w/pt in regards to monitor.

## 2014-03-13 NOTE — Telephone Encounter (Signed)
Will forward to [employer] in the device clinic to address.

## 2014-03-13 NOTE — Telephone Encounter (Signed)
New problem:      Pt would like to know if she should take her Biotronic machine with her on her trip this afternoon please give her a call back by  2 pm

## 2014-03-14 ENCOUNTER — Encounter (HOSPITAL_COMMUNITY): Payer: Self-pay | Admitting: *Deleted

## 2014-03-18 ENCOUNTER — Other Ambulatory Visit: Payer: Self-pay | Admitting: Internal Medicine

## 2014-03-18 ENCOUNTER — Ambulatory Visit (INDEPENDENT_AMBULATORY_CARE_PROVIDER_SITE_OTHER): Payer: Medicare HMO | Admitting: *Deleted

## 2014-03-18 DIAGNOSIS — R001 Bradycardia, unspecified: Secondary | ICD-10-CM

## 2014-03-18 DIAGNOSIS — Z950 Presence of cardiac pacemaker: Secondary | ICD-10-CM

## 2014-03-18 DIAGNOSIS — I498 Other specified cardiac arrhythmias: Secondary | ICD-10-CM

## 2014-03-18 LAB — MDC_IDC_ENUM_SESS_TYPE_INCLINIC
Implantable Pulse Generator Model: 394969
Implantable Pulse Generator Serial Number: 68266686

## 2014-03-18 NOTE — Progress Notes (Signed)
Wound check appointment.  Wound without redness or edema. Incision edges approximated, wound well healed. Normal device function. Thresholds, sensing, and impedances consistent with implant measurements. Device programmed at 3.5V/auto capture programmed on for extra safety margin until 3 month visit. Histogram distribution appropriate for patient and level of activity. No mode switches or high ventricular rates noted. Patient educated about wound care, arm mobility, lifting restrictions. ROV in 3 months with implanting physician. 

## 2014-04-16 ENCOUNTER — Encounter: Payer: Self-pay | Admitting: Internal Medicine

## 2014-06-18 ENCOUNTER — Encounter: Payer: Self-pay | Admitting: *Deleted

## 2014-06-23 ENCOUNTER — Ambulatory Visit (INDEPENDENT_AMBULATORY_CARE_PROVIDER_SITE_OTHER): Payer: Medicare HMO | Admitting: Internal Medicine

## 2014-06-23 ENCOUNTER — Encounter: Payer: Self-pay | Admitting: Internal Medicine

## 2014-06-23 VITALS — BP 96/50 | HR 79 | Ht 65.0 in

## 2014-06-23 DIAGNOSIS — I952 Hypotension due to drugs: Secondary | ICD-10-CM

## 2014-06-23 DIAGNOSIS — Z45018 Encounter for adjustment and management of other part of cardiac pacemaker: Secondary | ICD-10-CM | POA: Diagnosis not present

## 2014-06-23 DIAGNOSIS — R001 Bradycardia, unspecified: Secondary | ICD-10-CM

## 2014-06-23 DIAGNOSIS — R55 Syncope and collapse: Secondary | ICD-10-CM

## 2014-06-23 NOTE — Progress Notes (Signed)
Patient Care Team: [name], MD as PCP - General (Internal Medicine)   HPI  [name] is a 53 y.o. female Seen following pacemaker implantation for sinus node dysfunction 7/15 ; echocardiogram 9/14 demonstrated normal LV function without evidence of HCM  S/pR BKA  Uses prosthesis   She is much better with less shortness of breath and no further syncope    Past Medical History  Diagnosis Date  . Diabetic neuropathy   . Cellulitis   . Thyroid disease   . Hyperlipidemia   . Neuromuscular disorder   . Concussion as a teenager    slight  . Diarrhea     pt takes Reglan tid  . Hypothyroidism     takes Synthroid daily  . Depression     takes CYmbalta daily  . MRSA (methicillin resistant staph aureus) culture positive     hx of 2012  . Staph infection 2007  . Complication of anesthesia 01/2013    "didn't know where I was; who I was; talking out the top of my head" (May 18, 2013)  . Hypertension   . Peripheral vascular disease   . CHF (congestive heart failure)   . Pneumonia 1980's    "hospitalized w/it" (2013/05/18)  . Chronic bronchitis   . Orthopnea     "just one; yesterday morning" (May 18, 2013)  . Type II diabetes mellitus     Novolog,Levemir,and Lovaza daily  . GERD (gastroesophageal reflux disease)   . Daily headache   . Rheumatoid arthritis     "both knees" (05/18/2013)  . Anxiety   . Kidney stone     "passed on before" (05-18-13)  . Non-healing wound of lower extremity     lt foot  . OSA on CPAP     and oxygen  . COPD (chronic obstructive pulmonary disease)     wear oxygen at home  . Symptomatic bradycardia 03/04/2014    Past Surgical History  Procedure Laterality Date  . Amputation    . Leg amputation below knee Right 2012  . Toe amputation Left ?2011     only 2 toes remaning.   . Tubal ligation  1990's  . Foot surgery Right 2012    "put pins in one year; amputated toes another OR" (05-18-13)  . Wrist surgery Right 1980's    "pinched  nerve" (2013-05-18)  . Dilation and curettage of uterus  1980's  . Refractive surgery Bilateral 2014  . Incision and drainage abscess N/A 01/24/2013    Procedure: INCISION AND DRAINAGE ABSCESS;  Surgeon: Clovis Pu. Cornett, MD;  Location: [facility] OR;  Service: General;  Laterality: N/A;  . Inguinal hernia repair N/A 01/28/2013    Procedure: I&D Right Groin Wound;  Surgeon: Shelly Rubenstein, MD;  Location: [facility] OR;  Service: General;  Laterality: N/A;  . Ganglion cyst excision Left 1990's    "wrist" (18-May-2013)  . Bladder surgery  1970's    "stretched the mouth of my bladder" (18-May-2013)  . I&d extremity Left 08/25/2013    Procedure: IRRIGATION AND DEBRIDEMENT EXTREMITY;  Surgeon: Axel Filler, MD;  Location: [facility] OR;  Service: General;  Laterality: Left;  . Pacemaker insertion  03/05/14    Biotronik dual chamber pacemaker implanted by Dr Graciela Husbands for symptomatic bradycardia    Current Outpatient Prescriptions  Medication Sig Dispense Refill  . amLODipine (NORVASC) 5 MG tablet Take 5 mg by mouth daily.  6  . aspirin EC 81 MG tablet Take 81 mg by mouth daily.    Marland Kitchen  atorvastatin (LIPITOR) 80 MG tablet Take 80 mg by mouth daily.    Marland Kitchen FLUoxetine (PROZAC) 20 MG tablet Take 20 mg by mouth 3 (three) times daily.     . Fluticasone Furoate-Vilanterol (BREO ELLIPTA) 100-25 MCG/INH AEPB Inhale 1 puff into the lungs daily.    . furosemide (LASIX) 40 MG tablet Take 40 mg by mouth daily.  2  . gabapentin (NEURONTIN) 300 MG capsule Take 300 mg by mouth 3 (three) times daily.  11  . insulin detemir (LEVEMIR) 100 UNIT/ML injection Inject 200 Units into the skin daily.     Marland Kitchen levothyroxine (SYNTHROID, LEVOTHROID) 25 MCG tablet Take 25 mcg by mouth every morning.     Marland Kitchen lisinopril (PRINIVIL,ZESTRIL) 5 MG tablet Take 5 mg by mouth daily.  11  . metoCLOPramide (REGLAN) 10 MG tablet Take 10 mg by mouth 4 (four) times daily.    Marland Kitchen NOVOLOG FLEXPEN 100 UNIT/ML FlexPen Take 100 Units by mouth daily. Sliding scale  6  . omeprazole  (PRILOSEC) 20 MG capsule Take 20 mg by mouth daily.    Marland Kitchen oxybutynin (DITROPAN) 5 MG tablet Take 5 mg by mouth every morning.      . tiotropium (SPIRIVA) 18 MCG inhalation capsule Place 18 mcg into inhaler and inhale daily.     No current facility-administered medications for this visit.    Allergies  Allergen Reactions  . Gadolinium Nausea And Vomiting     Code: VOM, Desc: Pt began vomiting immed post infusion of multihance, Onset Date: [id number]   . Ivp Dye [Iodinated Diagnostic Agents] Nausea And Vomiting    Nausea and vomiting   . Metformin Diarrhea     diarrhea  . Penicillins Hives    Hives     Review of Systems negative except from HPI and PMH  Physical Exam BP 96/50 mmHg  Pulse 79  Ht 5\' 5"  (1.651 m)  Wt  Well developed and well nourished in no acute distress HENT normal E scleral and icterus clear Neck Supple JVP flat; carotids brisk and full Clear to ausculation Device pocket well healed; without hematoma or erythema.  There is no tethering Regular rate and rhythm, no murmurs gallops or rub Soft with active bowel sounds No clubbing cyanosis  prothesis Edema Alert and oriented, grossly normal motor and sensory function Skin Warm and Dry  ECG A pacing intrinsic conduction 16/08/40  Assessment and  Plan  Sinus node dysfunction   Pacemaker  Syncope   Hypotension  We will Discontinue her amlodipine as she has recently been introduced to lisinopril. She will be following up with her PCP next week.  Pacemaker function normal device program to maximize longevity  She has had no recurrent syncope

## 2014-06-23 NOTE — Patient Instructions (Signed)
Your physician has recommended you make the following change in your medication:  1) STOP Amlodipine  Remote monitoring is used to monitor your Pacemaker of ICD from home. This monitoring reduces the number of office visits required to check your device to one time per year. It allows [place] to keep an eye on the functioning of your device to ensure it is working properly. You are scheduled for a device check from home on 09/24/14. You may send your transmission at any time that day. If you have a wireless device, the transmission will be sent automatically. After your physician reviews your transmission, you will receive a postcard with your next transmission date.  Your physician wants you to follow-up in: 9 months with Dr. Graciela Husbands.  You will receive a reminder letter in the mail two months in advance. If you don't receive a letter, please call our office to schedule the follow-up appointment.

## 2014-06-24 LAB — MDC_IDC_ENUM_SESS_TYPE_INCLINIC
Brady Statistic RA Percent Paced: 39 %
Brady Statistic RV Percent Paced: 0 %
Date Time Interrogation Session: 20151103194700
Implantable Pulse Generator Model: 394969
Implantable Pulse Generator Serial Number: 68266686
Lead Channel Impedance Value: 741 Ohm
Lead Channel Pacing Threshold Amplitude: 0.6 V
Lead Channel Pacing Threshold Amplitude: 1 V
Lead Channel Pacing Threshold Pulse Width: 0.4 ms
Lead Channel Pacing Threshold Pulse Width: 0.4 ms
Lead Channel Pacing Threshold Pulse Width: 0.4 ms
Lead Channel Pacing Threshold Pulse Width: 0.4 ms
Lead Channel Sensing Intrinsic Amplitude: 7.3 mV
Lead Channel Sensing Intrinsic Amplitude: 7.6 mV
Lead Channel Sensing Intrinsic Amplitude: 9.1 mV
Lead Channel Setting Pacing Amplitude: 2 V
Lead Channel Setting Pacing Amplitude: 2.4 V
Lead Channel Setting Pacing Pulse Width: 0.4 ms
MDC IDC MSMT LEADCHNL RA IMPEDANCE VALUE: 429 Ohm
MDC IDC MSMT LEADCHNL RA PACING THRESHOLD AMPLITUDE: 1 V
MDC IDC MSMT LEADCHNL RA PACING THRESHOLD AMPLITUDE: 1 V
MDC IDC MSMT LEADCHNL RA PACING THRESHOLD PULSEWIDTH: 0.4 ms
MDC IDC MSMT LEADCHNL RV PACING THRESHOLD AMPLITUDE: 0.5 V
MDC IDC MSMT LEADCHNL RV PACING THRESHOLD AMPLITUDE: 0.6 V
MDC IDC MSMT LEADCHNL RV PACING THRESHOLD PULSEWIDTH: 0.4 ms
MDC IDC MSMT LEADCHNL RV SENSING INTR AMPL: 9.2 mV

## 2014-07-30 ENCOUNTER — Encounter (HOSPITAL_COMMUNITY): Payer: Self-pay | Admitting: Internal Medicine

## 2014-08-12 ENCOUNTER — Telehealth: Payer: Self-pay | Admitting: *Deleted

## 2014-08-12 NOTE — Telephone Encounter (Signed)
Faxed clearance to [facility]. Faxed surgical clearance for cataract extraction w/ intraocular lens implantation bilateral eyes on 09/21/14 & 10/05/14.

## 2014-09-15 ENCOUNTER — Emergency Department (HOSPITAL_COMMUNITY)
Admission: EM | Admit: 2014-09-15 | Discharge: 2014-09-15 | Disposition: A | Discharge status: Home / Self Care | Payer: Medicare HMO | Attending: Emergency Medicine | Admitting: Emergency Medicine

## 2014-09-15 ENCOUNTER — Encounter (HOSPITAL_COMMUNITY): Payer: Self-pay | Admitting: Family Medicine

## 2014-09-15 DIAGNOSIS — G4733 Obstructive sleep apnea (adult) (pediatric): Secondary | ICD-10-CM | POA: Diagnosis not present

## 2014-09-15 DIAGNOSIS — R011 Cardiac murmur, unspecified: Secondary | ICD-10-CM | POA: Diagnosis not present

## 2014-09-15 DIAGNOSIS — J449 Chronic obstructive pulmonary disease, unspecified: Secondary | ICD-10-CM | POA: Insufficient documentation

## 2014-09-15 DIAGNOSIS — Z950 Presence of cardiac pacemaker: Secondary | ICD-10-CM | POA: Insufficient documentation

## 2014-09-15 DIAGNOSIS — Z8701 Personal history of pneumonia (recurrent): Secondary | ICD-10-CM | POA: Diagnosis not present

## 2014-09-15 DIAGNOSIS — I509 Heart failure, unspecified: Secondary | ICD-10-CM | POA: Insufficient documentation

## 2014-09-15 DIAGNOSIS — K219 Gastro-esophageal reflux disease without esophagitis: Secondary | ICD-10-CM | POA: Diagnosis not present

## 2014-09-15 DIAGNOSIS — Z880 Allergy status to penicillin: Secondary | ICD-10-CM | POA: Diagnosis not present

## 2014-09-15 DIAGNOSIS — Z87442 Personal history of urinary calculi: Secondary | ICD-10-CM | POA: Insufficient documentation

## 2014-09-15 DIAGNOSIS — E785 Hyperlipidemia, unspecified: Secondary | ICD-10-CM | POA: Insufficient documentation

## 2014-09-15 DIAGNOSIS — E039 Hypothyroidism, unspecified: Secondary | ICD-10-CM | POA: Diagnosis not present

## 2014-09-15 DIAGNOSIS — Z79899 Other long term (current) drug therapy: Secondary | ICD-10-CM | POA: Diagnosis not present

## 2014-09-15 DIAGNOSIS — I10 Essential (primary) hypertension: Secondary | ICD-10-CM | POA: Diagnosis not present

## 2014-09-15 DIAGNOSIS — Z794 Long term (current) use of insulin: Secondary | ICD-10-CM | POA: Insufficient documentation

## 2014-09-15 DIAGNOSIS — Z720 Tobacco use: Secondary | ICD-10-CM | POA: Insufficient documentation

## 2014-09-15 DIAGNOSIS — K529 Noninfective gastroenteritis and colitis, unspecified: Secondary | ICD-10-CM | POA: Diagnosis not present

## 2014-09-15 DIAGNOSIS — R197 Diarrhea, unspecified: Secondary | ICD-10-CM

## 2014-09-15 DIAGNOSIS — Z7982 Long term (current) use of aspirin: Secondary | ICD-10-CM | POA: Diagnosis not present

## 2014-09-15 DIAGNOSIS — Z8614 Personal history of Methicillin resistant Staphylococcus aureus infection: Secondary | ICD-10-CM | POA: Insufficient documentation

## 2014-09-15 DIAGNOSIS — F329 Major depressive disorder, single episode, unspecified: Secondary | ICD-10-CM | POA: Diagnosis not present

## 2014-09-15 DIAGNOSIS — Z872 Personal history of diseases of the skin and subcutaneous tissue: Secondary | ICD-10-CM | POA: Diagnosis not present

## 2014-09-15 DIAGNOSIS — E1140 Type 2 diabetes mellitus with diabetic neuropathy, unspecified: Secondary | ICD-10-CM | POA: Insufficient documentation

## 2014-09-15 DIAGNOSIS — Z7951 Long term (current) use of inhaled steroids: Secondary | ICD-10-CM | POA: Diagnosis not present

## 2014-09-15 DIAGNOSIS — Z87828 Personal history of other (healed) physical injury and trauma: Secondary | ICD-10-CM | POA: Diagnosis not present

## 2014-09-15 DIAGNOSIS — F419 Anxiety disorder, unspecified: Secondary | ICD-10-CM | POA: Insufficient documentation

## 2014-09-15 DIAGNOSIS — R112 Nausea with vomiting, unspecified: Secondary | ICD-10-CM

## 2014-09-15 DIAGNOSIS — M069 Rheumatoid arthritis, unspecified: Secondary | ICD-10-CM | POA: Diagnosis not present

## 2014-09-15 DIAGNOSIS — Z8619 Personal history of other infectious and parasitic diseases: Secondary | ICD-10-CM | POA: Insufficient documentation

## 2014-09-15 DIAGNOSIS — Z9981 Dependence on supplemental oxygen: Secondary | ICD-10-CM | POA: Diagnosis not present

## 2014-09-15 LAB — CBC WITH DIFFERENTIAL/PLATELET
Basophils Absolute: 0 10*3/uL (ref 0.0–0.1)
Basophils Relative: 0 % (ref 0–1)
Eosinophils Absolute: 0.1 10*3/uL (ref 0.0–0.7)
Eosinophils Relative: 1 % (ref 0–5)
HEMATOCRIT: 42.9 % (ref 36.0–46.0)
Hemoglobin: 14.8 g/dL (ref 12.0–15.0)
LYMPHS ABS: 1.4 10*3/uL (ref 0.7–4.0)
LYMPHS PCT: 10 % — AB (ref 12–46)
MCH: 29.8 pg (ref 26.0–34.0)
MCHC: 34.5 g/dL (ref 30.0–36.0)
MCV: 86.5 fL (ref 78.0–100.0)
Monocytes Absolute: 1.3 10*3/uL — ABNORMAL HIGH (ref 0.1–1.0)
Monocytes Relative: 9 % (ref 3–12)
NEUTROS ABS: 11.8 10*3/uL — AB (ref 1.7–7.7)
Neutrophils Relative %: 80 % — ABNORMAL HIGH (ref 43–77)
Platelets: 169 10*3/uL (ref 150–400)
RBC: 4.96 MIL/uL (ref 3.87–5.11)
RDW: 14.3 % (ref 11.5–15.5)
WBC: 14.6 10*3/uL — ABNORMAL HIGH (ref 4.0–10.5)

## 2014-09-15 LAB — COMPREHENSIVE METABOLIC PANEL
ALT: 22 U/L (ref 0–35)
AST: 32 U/L (ref 0–37)
Albumin: 3.4 g/dL — ABNORMAL LOW (ref 3.5–5.2)
Alkaline Phosphatase: 134 U/L — ABNORMAL HIGH (ref 39–117)
Anion gap: 10 (ref 5–15)
BUN: 40 mg/dL — AB (ref 6–23)
CO2: 26 mmol/L (ref 19–32)
Calcium: 9 mg/dL (ref 8.4–10.5)
Chloride: 101 mmol/L (ref 96–112)
Creatinine, Ser: 1.34 mg/dL — ABNORMAL HIGH (ref 0.50–1.10)
GFR calc Af Amer: 51 mL/min — ABNORMAL LOW (ref >90)
GFR, EST NON AFRICAN AMERICAN: 44 mL/min — AB (ref >90)
Glucose, Bld: 206 mg/dL — ABNORMAL HIGH (ref 70–99)
Potassium: 4.8 mmol/L (ref 3.5–5.1)
Sodium: 137 mmol/L (ref 135–145)
Total Bilirubin: 0.8 mg/dL (ref 0.3–1.2)
Total Protein: 7.3 g/dL (ref 6.0–8.3)

## 2014-09-15 LAB — MAGNESIUM: Magnesium: 1.8 mg/dL (ref 1.5–2.5)

## 2014-09-15 MED ORDER — LACTATED RINGERS IV BOLUS (SEPSIS)
1000.0000 mL | Freq: Once | INTRAVENOUS | Status: AC
  Administered 2014-09-15: 1000 mL via INTRAVENOUS

## 2014-09-15 MED ORDER — ONDANSETRON 8 MG PO TBDP: 8.0000 mg | ORAL_TABLET | Freq: Three times a day (TID) | ORAL | Status: DC | PRN

## 2014-09-15 NOTE — ED Notes (Signed)
Provided patient hygiene, she had diarrhea. Changed her diaper.

## 2014-09-15 NOTE — ED Notes (Signed)
Pt went to the restroom.  Did not have a BM for stool sample and missed the cup for UA.

## 2014-09-15 NOTE — ED Notes (Signed)
Per EMS and patient, she is experiencing nausea, vomiting, diarrhea and flu-like symptoms. Pt reports symptoms started at 23:00 last night. She reports she started with gas and then diarrhea and vomiting. Pt reports having good chills.

## 2014-09-15 NOTE — ED Notes (Signed)
Bed: WA05 Expected date:  Expected time:  Means of arrival:  Comments: EMS 

## 2014-09-15 NOTE — ED Notes (Signed)
[facility] called for transportation home 

## 2014-09-15 NOTE — ED Provider Notes (Signed)
CSN: [id number]     Arrival date & time 09/15/14  0103 History   First MD Initiated Contact with Patient 09/15/14 0127     Chief Complaint  Patient presents with  . Emesis  . Diarrhea  . Flu-Like Symptoms      (Consider location/radiation/quality/duration/timing/severity/associated sxs/prior Treatment) HPI Comments: Pt with hx of iddm, htn, pvd, rheumatoid arthritis comes in with cc of n/v/diarrhea/abd pain. Pt was feelinf "gassy" this evening. She went to bed, but at 11 pm she started having diarrhea. Pt has had 5-6 loose, watery, foul smelling stools, no blood. Pt then started having cramping abd pain and nausea with emesis x 2. Pt denies any recent travel hx, any suspicious po intake, and no recent antibiotic exposure. No BM or emesis - since arrival to the ER. Pt has no new meds, or changes to meds and no sick contacts.  Patient is a 54 y.o. female presenting with vomiting and diarrhea. The history is provided by the patient.  Emesis Associated symptoms: abdominal pain and diarrhea   Associated symptoms: no headaches   Diarrhea Associated symptoms: abdominal pain and vomiting   Associated symptoms: no fever and no headaches     Past Medical History  Diagnosis Date  . Diabetic neuropathy   . Cellulitis   . Thyroid disease   . Hyperlipidemia   . Neuromuscular disorder   . Concussion as a teenager    slight  . Diarrhea     pt takes Reglan tid  . Hypothyroidism     takes Synthroid daily  . Depression     takes CYmbalta daily  . MRSA (methicillin resistant staph aureus) culture positive     hx of 2012  . Staph infection 2007  . Complication of anesthesia 01/2013    "didn't know where I was; who I was; talking out the top of my head" (05-20-13)  . Hypertension   . Peripheral vascular disease   . CHF (congestive heart failure)   . Pneumonia 1980's    "hospitalized w/it" (05/20/13)  . Chronic bronchitis   . Orthopnea     "just one; yesterday morning" (05-20-13)  . Type  II diabetes mellitus     Novolog,Levemir,and Lovaza daily  . GERD (gastroesophageal reflux disease)   . Daily headache   . Rheumatoid arthritis     "both knees" (05-20-2013)  . Anxiety   . Kidney stone     "passed on before" (05/20/2013)  . Non-healing wound of lower extremity     lt foot  . OSA on CPAP     and oxygen  . COPD (chronic obstructive pulmonary disease)     wear oxygen at home  . Symptomatic bradycardia 03/04/2014   Past Surgical History  Procedure Laterality Date  . Amputation    . Leg amputation below knee Right 2012  . Toe amputation Left ?2011     only 2 toes remaning.   . Tubal ligation  1990's  . Foot surgery Right 2012    "put pins in one year; amputated toes another OR" (2013-05-20)  . Wrist surgery Right 1980's    "pinched nerve" (2013/05/20)  . Dilation and curettage of uterus  1980's  . Refractive surgery Bilateral 2014  . Incision and drainage abscess N/A 01/24/2013    Procedure: INCISION AND DRAINAGE ABSCESS;  Surgeon: Clovis Pu. Cornett, MD;  Location: [facility] OR;  Service: General;  Laterality: N/A;  . Inguinal hernia repair N/A 01/28/2013    Procedure: I&D Right Groin Wound;  Surgeon: Shelly Rubenstein, MD;  Location: [facility] OR;  Service: General;  Laterality: N/A;  . Ganglion cyst excision Left 1990's    "wrist" (04/21/2013)  . Bladder surgery  1970's    "stretched the mouth of my bladder" (04/21/2013)  . I&d extremity Left 08/25/2013    Procedure: IRRIGATION AND DEBRIDEMENT EXTREMITY;  Surgeon: Axel Filler, MD;  Location: [facility] OR;  Service: General;  Laterality: Left;  . Pacemaker insertion  03/05/14    Biotronik dual chamber pacemaker implanted by Dr Graciela Husbands for symptomatic bradycardia  . Permanent pacemaker insertion N/A 03/05/2014    Procedure: PERMANENT PACEMAKER INSERTION;  Surgeon: Duke Salvia, MD;  Location: [facility] CATH LAB;  Service: Cardiovascular;  Laterality: N/A;   Family History  Problem Relation Age of Onset  . Hypertension Mother   . Hyperlipidemia  Mother   . Hypertrophic cardiomyopathy Mother     has pacer  . Hypertension Brother   . Hyperlipidemia Brother   . Cancer Maternal Aunt   . Diabetes Paternal Aunt   . Diabetes Paternal Uncle   . Diabetes Paternal Grandmother   . Anesthesia problems Neg Hx   . Hypotension Neg Hx   . Malignant hyperthermia Neg Hx   . Pseudochol deficiency Neg Hx   . Alzheimer's disease Father    History  Substance Use Topics  . Smoking status: Current Every Day Smoker -- 1.00 packs/day for 33 years    Types: Cigarettes  . Smokeless tobacco: Never Used  . Alcohol Use: No   OB History    No data available     Review of Systems  Constitutional: Negative for fever, activity change and unexpected weight change.  Respiratory: Negative for shortness of breath.   Cardiovascular: Negative for chest pain.  Gastrointestinal: Positive for nausea, vomiting, abdominal pain and diarrhea.  Genitourinary: Negative for dysuria.  Musculoskeletal: Negative for neck pain.  Skin: Negative for rash.  Neurological: Positive for dizziness and light-headedness. Negative for syncope and headaches.      Allergies  Gadolinium; Ivp dye; Metformin; and Penicillins  Home Medications   Prior to Admission medications   Medication Sig Start Date End Date Taking? Authorizing Provider  ADVAIR DISKUS 250-50 MCG/DOSE AEPB Inhale 1 puff into the lungs 2 (two) times daily. 08/25/14  Yes Historical Provider, MD  amLODipine (NORVASC) 5 MG tablet Take 5 mg by mouth daily. 08/31/14  Yes Historical Provider, MD  aspirin EC 81 MG tablet Take 81 mg by mouth daily.   Yes Historical Provider, MD  atorvastatin (LIPITOR) 80 MG tablet Take 80 mg by mouth daily.   Yes Historical Provider, MD  FLUoxetine (PROZAC) 20 MG tablet Take 60 mg by mouth.    Yes Historical Provider, MD  Fluticasone Furoate-Vilanterol (BREO ELLIPTA) 100-25 MCG/INH AEPB Inhale 1 puff into the lungs daily.   Yes Historical Provider, MD  furosemide (LASIX) 40 MG tablet  Take 40 mg by mouth daily. 03/29/14  Yes Historical Provider, MD  gabapentin (NEURONTIN) 300 MG capsule Take 300 mg by mouth 3 (three) times daily. 06/12/14  Yes Historical Provider, MD  insulin detemir (LEVEMIR) 100 UNIT/ML injection Inject 200 Units into the skin daily.    Yes Historical Provider, MD  levothyroxine (SYNTHROID, LEVOTHROID) 25 MCG tablet Take 25 mcg by mouth every morning.    Yes Historical Provider, MD  lisinopril (PRINIVIL,ZESTRIL) 5 MG tablet Take 5 mg by mouth daily. 06/16/14  Yes Historical Provider, MD  metoCLOPramide (REGLAN) 10 MG tablet Take 10 mg by mouth 4 (four)  times daily.   Yes Historical Provider, MD  NOVOLOG FLEXPEN 100 UNIT/ML FlexPen Take 8-25 Units by mouth daily. Sliding scale 05/29/14  Yes Historical Provider, MD  omeprazole (PRILOSEC) 20 MG capsule Take 20 mg by mouth daily.   Yes Historical Provider, MD  oxybutynin (DITROPAN) 5 MG tablet Take 5 mg by mouth every morning.     Yes Historical Provider, MD  tiotropium (SPIRIVA) 18 MCG inhalation capsule Place 18 mcg into inhaler and inhale daily.   Yes Historical Provider, MD  ondansetron (ZOFRAN ODT) 8 MG disintegrating tablet Take 1 tablet (8 mg total) by mouth every 8 (eight) hours as needed for nausea. 09/15/14   Arlen Legendre Rhunette Croft, MD   BP 117/62 mmHg  Pulse 74  Temp(Src) 99 F (37.2 C) (Oral)  Resp 16  Ht 5\' 5"  (1.651 m)  Wt 230 lb (104.327 kg)  BMI 38.27 kg/m2  SpO2 94% Physical Exam  Constitutional: She is oriented to person, place, and time. She appears well-developed and well-nourished.  HENT:  Head: Normocephalic and atraumatic.  Eyes: EOM are normal. Pupils are equal, round, and reactive to light.  Neck: Neck supple.  Cardiovascular: Normal rate and regular rhythm.   Murmur heard. Pulmonary/Chest: Effort normal. No respiratory distress.  Abdominal: Soft. She exhibits no distension. There is no tenderness. There is no rebound and no guarding.  Musculoskeletal:  R below the knee amputation. L  leg - pt has a cast  Neurological: She is alert and oriented to person, place, and time.  Skin: Skin is warm and dry.  Nursing note and vitals reviewed.   ED Course  Procedures (including critical care time) Labs Review Labs Reviewed  CBC WITH DIFFERENTIAL/PLATELET - Abnormal; Notable for the following:    WBC 14.6 (*)    Neutrophils Relative % 80 (*)    Neutro Abs 11.8 (*)    Lymphocytes Relative 10 (*)    Monocytes Absolute 1.3 (*)    All other components within normal limits  COMPREHENSIVE METABOLIC PANEL - Abnormal; Notable for the following:    Glucose, Bld 206 (*)    BUN 40 (*)    Creatinine, Ser 1.34 (*)    Albumin 3.4 (*)    Alkaline Phosphatase 134 (*)    GFR calc non Af Amer 44 (*)    GFR calc Af Amer 51 (*)    All other components within normal limits  MAGNESIUM    Imaging Review No results found.   EKG Interpretation None      MDM   Final diagnoses:  Gastroenteritis  Diarrhea  Nausea and vomiting, vomiting of unspecified type    Pt comes in with n/v/diarrhea, flu like sx, all starting this evening. PT has hx of IDDM, CHF, ? PVD. She has no chest pain, dib, and there is no significant cardiac hx. Pt has a non peritoneal abd pain. DDX: Vascular pathology, including ichemia, infectious pathology - bacterial or viral, toxin induced GI pathology are all high on the ddx. PT has no abd pain - so ischemic pathology deemed less likely. She has no specific cdiff risk factros. Labs show that pt has leukocytosis - but that is a non specific value. She has no DKA, mild hyperglycemia. Pt has no signs of profound dehydration or elyte abnormalities.  Unsure what the cause of her sx is, but after careful observation and 3-4 reassessments, it appears that this is unlikely to be something surgical or vascular. Pt unable to give a stool sample, while she was  in the ER for 7 hours.   Return precautions discussed, and results were shared with the pt. We have planned to  go with conservative management for now.    Derwood Kaplan, MD 09/16/14 2217

## 2014-09-15 NOTE — Discharge Instructions (Signed)
We saw you in the ER for the nausea and vomiting, with diarrhea. All the results in the ER are normal, labs and imaging. We are not sure what is causing your symptoms. The workup in the ER is not complete, and is limited to screening for life threatening and emergent conditions only, so please see a primary care doctor for further evaluation.  Please return to the ER if your symptoms worsen; you have increased pain, fevers, chills, inability to keep any medications down, confusion, bloody stools. Otherwise see the outpatient doctor as requested.   Nausea and Vomiting Nausea is a sick feeling that often comes before throwing up (vomiting). Vomiting is a reflex where stomach contents come out of your mouth. Vomiting can cause severe loss of body fluids (dehydration). Children and elderly adults can become dehydrated quickly, especially if they also have diarrhea. Nausea and vomiting are symptoms of a condition or disease. It is important to find the cause of your symptoms. CAUSES   Direct irritation of the stomach lining. This irritation can result from increased acid production (gastroesophageal reflux disease), infection, food poisoning, taking certain medicines (such as nonsteroidal anti-inflammatory drugs), alcohol use, or tobacco use.  Signals from the brain.These signals could be caused by a headache, heat exposure, an inner ear disturbance, increased pressure in the brain from injury, infection, a tumor, or a concussion, pain, emotional stimulus, or metabolic problems.  An obstruction in the gastrointestinal tract (bowel obstruction).  Illnesses such as diabetes, hepatitis, gallbladder problems, appendicitis, kidney problems, cancer, sepsis, atypical symptoms of a heart attack, or eating disorders.  Medical treatments such as chemotherapy and radiation.  Receiving medicine that makes you sleep (general anesthetic) during surgery. DIAGNOSIS Your caregiver may ask for tests to be done if  the problems do not improve after a few days. Tests may also be done if symptoms are severe or if the reason for the nausea and vomiting is not clear. Tests may include:  Urine tests.  Blood tests.  Stool tests.  Cultures (to look for evidence of infection).  X-rays or other imaging studies. Test results can help your caregiver make decisions about treatment or the need for additional tests. TREATMENT You need to stay well hydrated. Drink frequently but in small amounts.You may wish to drink water, sports drinks, clear broth, or eat frozen ice pops or gelatin dessert to help stay hydrated.When you eat, eating slowly may help prevent nausea.There are also some antinausea medicines that may help prevent nausea. HOME CARE INSTRUCTIONS   Take all medicine as directed by your caregiver.  If you do not have an appetite, do not force yourself to eat. However, you must continue to drink fluids.  If you have an appetite, eat a normal diet unless your caregiver tells you differently.  Eat a variety of complex carbohydrates (rice, wheat, potatoes, bread), lean meats, yogurt, fruits, and vegetables.  Avoid high-fat foods because they are more difficult to digest.  Drink enough water and fluids to keep your urine clear or pale yellow.  If you are dehydrated, ask your caregiver for specific rehydration instructions. Signs of dehydration may include:  Severe thirst.  Dry lips and mouth.  Dizziness.  Dark urine.  Decreasing urine frequency and amount.  Confusion.  Rapid breathing or pulse. SEEK IMMEDIATE MEDICAL CARE IF:   You have blood or brown flecks (like coffee grounds) in your vomit.  You have black or bloody stools.  You have a severe headache or stiff neck.  You are confused.  You have severe abdominal pain.  You have chest pain or trouble breathing.  You do not urinate at least once every 8 hours.  You develop cold or clammy skin.  You continue to vomit for  longer than 24 to 48 hours.  You have a fever. MAKE SURE YOU:   Understand these instructions.  Will watch your condition.  Will get help right away if you are not doing well or get worse. Document Released: 08/07/2005 Document Revised: 10/30/2011 Document Reviewed: 01/04/2011 [facility] Patient Information 2015 [employer], [place]. This information is not intended to replace advice given to you by your health care provider. Make sure you discuss any questions you have with your health care provider.  Diarrhea Diarrhea is frequent loose and watery bowel movements. It can cause you to feel weak and dehydrated. Dehydration can cause you to become tired and thirsty, have a dry mouth, and have decreased urination that often is dark yellow. Diarrhea is a sign of another problem, most often an infection that will not last long. In most cases, diarrhea typically lasts 2-3 days. However, it can last longer if it is a sign of something more serious. It is important to treat your diarrhea as directed by your caregiver to lessen or prevent future episodes of diarrhea. CAUSES  Some common causes include:  Gastrointestinal infections caused by viruses, bacteria, or parasites.  Food poisoning or food allergies.  Certain medicines, such as antibiotics, chemotherapy, and laxatives.  Artificial sweeteners and fructose.  Digestive disorders. HOME CARE INSTRUCTIONS  Ensure adequate fluid intake (hydration): Have 1 cup (8 oz) of fluid for each diarrhea episode. Avoid fluids that contain simple sugars or sports drinks, fruit juices, whole milk products, and sodas. Your urine should be clear or pale yellow if you are drinking enough fluids. Hydrate with an oral rehydration solution that you can purchase at pharmacies, retail stores, and online. You can prepare an oral rehydration solution at home by mixing the following ingredients together:   - tsp table salt.   tsp baking soda.   tsp salt substitute  containing potassium chloride.  1  tablespoons sugar.  1 L (34 oz) of water.  Certain foods and beverages may increase the speed at which food moves through the gastrointestinal (GI) tract. These foods and beverages should be avoided and include:  Caffeinated and alcoholic beverages.  High-fiber foods, such as raw fruits and vegetables, nuts, seeds, and whole grain breads and cereals.  Foods and beverages sweetened with sugar alcohols, such as xylitol, sorbitol, and mannitol.  Some foods may be well tolerated and may help thicken stool including:  Starchy foods, such as rice, toast, pasta, low-sugar cereal, oatmeal, grits, baked potatoes, crackers, and bagels.  Bananas.  Applesauce.  Add probiotic-rich foods to help increase healthy bacteria in the GI tract, such as yogurt and fermented milk products.  Wash your hands well after each diarrhea episode.  Only take over-the-counter or prescription medicines as directed by your caregiver.  Take a warm bath to relieve any burning or pain from frequent diarrhea episodes. SEEK IMMEDIATE MEDICAL CARE IF:   You are unable to keep fluids down.  You have persistent vomiting.  You have blood in your stool, or your stools are black and tarry.  You do not urinate in 6-8 hours, or there is only a small amount of very dark urine.  You have abdominal pain that increases or localizes.  You have weakness, dizziness, confusion, or light-headedness.  You have a severe headache.  Your diarrhea gets worse or does not get better.  You have a fever or persistent symptoms for more than 2-3 days.  You have a fever and your symptoms suddenly get worse. MAKE SURE YOU:   Understand these instructions.  Will watch your condition.  Will get help right away if you are not doing well or get worse. Document Released: 07/28/2002 Document Revised: 12/22/2013 Document Reviewed: 04/14/2012 [facility] Patient Information 2015 [place], [place]. This  information is not intended to replace advice given to you by your health care provider. Make sure you discuss any questions you have with your health care provider.

## 2014-09-15 NOTE — ED Notes (Signed)
Attempted IV x 2 without success  Pt tolerated well  IV team consult placed

## 2014-09-15 NOTE — ED Notes (Signed)
Inquired about going to the restroom for a urine sample. Pt states she would like to wait to get up to urinate. Pt has no needs or complaints.

## 2014-09-15 NOTE — ED Notes (Signed)
Patient does not feel she needs to urinate yet.

## 2014-09-24 ENCOUNTER — Telehealth: Payer: Self-pay | Admitting: Internal Medicine

## 2014-09-24 ENCOUNTER — Ambulatory Visit (INDEPENDENT_AMBULATORY_CARE_PROVIDER_SITE_OTHER): Payer: Medicare HMO | Admitting: *Deleted

## 2014-09-24 DIAGNOSIS — R001 Bradycardia, unspecified: Secondary | ICD-10-CM

## 2014-09-24 NOTE — Progress Notes (Signed)
Remote pacemaker transmission.   

## 2014-09-24 NOTE — Telephone Encounter (Signed)
Informed pt that remote transmission was received.

## 2014-09-24 NOTE — Telephone Encounter (Signed)
New Message  Pt calling to confirm if the remote pacer check went through. Please call back and discuss.

## 2014-10-02 LAB — MDC_IDC_ENUM_SESS_TYPE_INCLINIC
Brady Statistic RA Percent Paced: 39 %
Brady Statistic RV Percent Paced: 0 %
Lead Channel Impedance Value: 390 Ohm
Lead Channel Pacing Threshold Amplitude: 0.5 V
Lead Channel Pacing Threshold Pulse Width: 0.4 ms
Lead Channel Pacing Threshold Pulse Width: 0.4 ms
Lead Channel Sensing Intrinsic Amplitude: 2.5 mV
Lead Channel Setting Pacing Amplitude: 2 V
Lead Channel Setting Pacing Amplitude: 2.4 V
Lead Channel Setting Pacing Pulse Width: 0.4 ms
MDC IDC MSMT LEADCHNL RA PACING THRESHOLD AMPLITUDE: 0.9 V
MDC IDC MSMT LEADCHNL RV IMPEDANCE VALUE: 605 Ohm
MDC IDC MSMT LEADCHNL RV SENSING INTR AMPL: 8.3 mV
MDC IDC PG MODEL: 394969
MDC IDC PG SERIAL: 68266686

## 2014-10-19 ENCOUNTER — Encounter: Payer: Self-pay | Admitting: Cardiology

## 2014-10-23 ENCOUNTER — Encounter: Payer: Self-pay | Admitting: Internal Medicine

## 2014-12-07 ENCOUNTER — Encounter (HOSPITAL_COMMUNITY): Payer: Self-pay | Admitting: Nurse Practitioner

## 2014-12-07 ENCOUNTER — Emergency Department (HOSPITAL_COMMUNITY): Payer: Medicare HMO

## 2014-12-07 ENCOUNTER — Inpatient Hospital Stay (HOSPITAL_COMMUNITY)
Admission: EM | Admit: 2014-12-07 | Discharge: 2014-12-14 | DRG: 580 | Disposition: A | Discharge status: Home / Self Care | Payer: Medicare HMO | Attending: Internal Medicine | Admitting: Internal Medicine

## 2014-12-07 DIAGNOSIS — M069 Rheumatoid arthritis, unspecified: Secondary | ICD-10-CM | POA: Diagnosis present

## 2014-12-07 DIAGNOSIS — Z7982 Long term (current) use of aspirin: Secondary | ICD-10-CM

## 2014-12-07 DIAGNOSIS — K219 Gastro-esophageal reflux disease without esophagitis: Secondary | ICD-10-CM | POA: Diagnosis present

## 2014-12-07 DIAGNOSIS — L97509 Non-pressure chronic ulcer of other part of unspecified foot with unspecified severity: Secondary | ICD-10-CM

## 2014-12-07 DIAGNOSIS — L97529 Non-pressure chronic ulcer of other part of left foot with unspecified severity: Secondary | ICD-10-CM | POA: Diagnosis present

## 2014-12-07 DIAGNOSIS — L03112 Cellulitis of left axilla: Secondary | ICD-10-CM | POA: Diagnosis present

## 2014-12-07 DIAGNOSIS — I5032 Chronic diastolic (congestive) heart failure: Secondary | ICD-10-CM | POA: Diagnosis present

## 2014-12-07 DIAGNOSIS — G8918 Other acute postprocedural pain: Secondary | ICD-10-CM | POA: Diagnosis not present

## 2014-12-07 DIAGNOSIS — T8740 Infection of amputation stump, unspecified extremity: Secondary | ICD-10-CM | POA: Diagnosis present

## 2014-12-07 DIAGNOSIS — K130 Diseases of lips: Secondary | ICD-10-CM | POA: Diagnosis not present

## 2014-12-07 DIAGNOSIS — J449 Chronic obstructive pulmonary disease, unspecified: Secondary | ICD-10-CM | POA: Diagnosis present

## 2014-12-07 DIAGNOSIS — L03115 Cellulitis of right lower limb: Principal | ICD-10-CM | POA: Diagnosis present

## 2014-12-07 DIAGNOSIS — L0201 Cutaneous abscess of face: Secondary | ICD-10-CM | POA: Diagnosis present

## 2014-12-07 DIAGNOSIS — G4733 Obstructive sleep apnea (adult) (pediatric): Secondary | ICD-10-CM | POA: Diagnosis present

## 2014-12-07 DIAGNOSIS — L02412 Cutaneous abscess of left axilla: Secondary | ICD-10-CM | POA: Diagnosis present

## 2014-12-07 DIAGNOSIS — F17210 Nicotine dependence, cigarettes, uncomplicated: Secondary | ICD-10-CM | POA: Diagnosis present

## 2014-12-07 DIAGNOSIS — E118 Type 2 diabetes mellitus with unspecified complications: Secondary | ICD-10-CM

## 2014-12-07 DIAGNOSIS — Z91041 Radiographic dye allergy status: Secondary | ICD-10-CM

## 2014-12-07 DIAGNOSIS — Y835 Amputation of limb(s) as the cause of abnormal reaction of the patient, or of later complication, without mention of misadventure at the time of the procedure: Secondary | ICD-10-CM | POA: Diagnosis present

## 2014-12-07 DIAGNOSIS — E039 Hypothyroidism, unspecified: Secondary | ICD-10-CM | POA: Diagnosis present

## 2014-12-07 DIAGNOSIS — I472 Ventricular tachycardia: Secondary | ICD-10-CM | POA: Diagnosis present

## 2014-12-07 DIAGNOSIS — F419 Anxiety disorder, unspecified: Secondary | ICD-10-CM | POA: Diagnosis present

## 2014-12-07 DIAGNOSIS — I129 Hypertensive chronic kidney disease with stage 1 through stage 4 chronic kidney disease, or unspecified chronic kidney disease: Secondary | ICD-10-CM | POA: Diagnosis present

## 2014-12-07 DIAGNOSIS — E861 Hypovolemia: Secondary | ICD-10-CM | POA: Diagnosis not present

## 2014-12-07 DIAGNOSIS — N189 Chronic kidney disease, unspecified: Secondary | ICD-10-CM | POA: Diagnosis present

## 2014-12-07 DIAGNOSIS — IMO0002 Reserved for concepts with insufficient information to code with codable children: Secondary | ICD-10-CM | POA: Diagnosis present

## 2014-12-07 DIAGNOSIS — D638 Anemia in other chronic diseases classified elsewhere: Secondary | ICD-10-CM | POA: Diagnosis present

## 2014-12-07 DIAGNOSIS — L02414 Cutaneous abscess of left upper limb: Secondary | ICD-10-CM

## 2014-12-07 DIAGNOSIS — L0390 Cellulitis, unspecified: Secondary | ICD-10-CM | POA: Diagnosis present

## 2014-12-07 DIAGNOSIS — Z9981 Dependence on supplemental oxygen: Secondary | ICD-10-CM

## 2014-12-07 DIAGNOSIS — E11621 Type 2 diabetes mellitus with foot ulcer: Secondary | ICD-10-CM

## 2014-12-07 DIAGNOSIS — H00013 Hordeolum externum right eye, unspecified eyelid: Secondary | ICD-10-CM

## 2014-12-07 DIAGNOSIS — R001 Bradycardia, unspecified: Secondary | ICD-10-CM | POA: Diagnosis present

## 2014-12-07 DIAGNOSIS — I10 Essential (primary) hypertension: Secondary | ICD-10-CM | POA: Diagnosis present

## 2014-12-07 DIAGNOSIS — L02619 Cutaneous abscess of unspecified foot: Secondary | ICD-10-CM

## 2014-12-07 DIAGNOSIS — E1165 Type 2 diabetes mellitus with hyperglycemia: Secondary | ICD-10-CM | POA: Diagnosis present

## 2014-12-07 DIAGNOSIS — H00019 Hordeolum externum unspecified eye, unspecified eyelid: Secondary | ICD-10-CM | POA: Diagnosis present

## 2014-12-07 DIAGNOSIS — E785 Hyperlipidemia, unspecified: Secondary | ICD-10-CM | POA: Diagnosis present

## 2014-12-07 DIAGNOSIS — Z89512 Acquired absence of left leg below knee: Secondary | ICD-10-CM

## 2014-12-07 DIAGNOSIS — Z950 Presence of cardiac pacemaker: Secondary | ICD-10-CM

## 2014-12-07 DIAGNOSIS — R739 Hyperglycemia, unspecified: Secondary | ICD-10-CM

## 2014-12-07 DIAGNOSIS — N179 Acute kidney failure, unspecified: Secondary | ICD-10-CM | POA: Diagnosis present

## 2014-12-07 DIAGNOSIS — R2240 Localized swelling, mass and lump, unspecified lower limb: Secondary | ICD-10-CM

## 2014-12-07 DIAGNOSIS — E1142 Type 2 diabetes mellitus with diabetic polyneuropathy: Secondary | ICD-10-CM | POA: Diagnosis present

## 2014-12-07 DIAGNOSIS — Z794 Long term (current) use of insulin: Secondary | ICD-10-CM

## 2014-12-07 DIAGNOSIS — E875 Hyperkalemia: Secondary | ICD-10-CM | POA: Diagnosis not present

## 2014-12-07 DIAGNOSIS — E11319 Type 2 diabetes mellitus with unspecified diabetic retinopathy without macular edema: Secondary | ICD-10-CM | POA: Diagnosis present

## 2014-12-07 DIAGNOSIS — Z9989 Dependence on other enabling machines and devices: Secondary | ICD-10-CM

## 2014-12-07 DIAGNOSIS — F17200 Nicotine dependence, unspecified, uncomplicated: Secondary | ICD-10-CM | POA: Diagnosis present

## 2014-12-07 DIAGNOSIS — F329 Major depressive disorder, single episode, unspecified: Secondary | ICD-10-CM | POA: Diagnosis present

## 2014-12-07 LAB — COMPREHENSIVE METABOLIC PANEL
ALBUMIN: 2.3 g/dL — AB (ref 3.5–5.2)
ALT: 18 U/L (ref 0–35)
AST: 20 U/L (ref 0–37)
Alkaline Phosphatase: 121 U/L — ABNORMAL HIGH (ref 39–117)
Anion gap: 12 (ref 5–15)
BUN: 33 mg/dL — ABNORMAL HIGH (ref 6–23)
CALCIUM: 8.2 mg/dL — AB (ref 8.4–10.5)
CO2: 24 mmol/L (ref 19–32)
Chloride: 92 mmol/L — ABNORMAL LOW (ref 96–112)
Creatinine, Ser: 1.65 mg/dL — ABNORMAL HIGH (ref 0.50–1.10)
GFR calc non Af Amer: 34 mL/min — ABNORMAL LOW (ref >90)
GFR, EST AFRICAN AMERICAN: 40 mL/min — AB (ref >90)
Glucose, Bld: 532 mg/dL — ABNORMAL HIGH (ref 70–99)
POTASSIUM: 4.4 mmol/L (ref 3.5–5.1)
SODIUM: 128 mmol/L — AB (ref 135–145)
Total Bilirubin: 0.5 mg/dL (ref 0.3–1.2)
Total Protein: 6 g/dL (ref 6.0–8.3)

## 2014-12-07 LAB — CBC WITH DIFFERENTIAL/PLATELET
BASOS ABS: 0 10*3/uL (ref 0.0–0.1)
Basophils Relative: 0 % (ref 0–1)
EOS ABS: 0 10*3/uL (ref 0.0–0.7)
Eosinophils Relative: 0 % (ref 0–5)
HCT: 34.2 % — ABNORMAL LOW (ref 36.0–46.0)
Hemoglobin: 11.4 g/dL — ABNORMAL LOW (ref 12.0–15.0)
LYMPHS PCT: 19 % (ref 12–46)
Lymphs Abs: 2 10*3/uL (ref 0.7–4.0)
MCH: 28.2 pg (ref 26.0–34.0)
MCHC: 33.3 g/dL (ref 30.0–36.0)
MCV: 84.7 fL (ref 78.0–100.0)
Monocytes Absolute: 0.8 10*3/uL (ref 0.1–1.0)
Monocytes Relative: 8 % (ref 3–12)
NEUTROS ABS: 7.7 10*3/uL (ref 1.7–7.7)
NEUTROS PCT: 73 % (ref 43–77)
PLATELETS: 151 10*3/uL (ref 150–400)
RBC: 4.04 MIL/uL (ref 3.87–5.11)
RDW: 14.2 % (ref 11.5–15.5)
WBC: 10.5 10*3/uL (ref 4.0–10.5)

## 2014-12-07 LAB — URINE MICROSCOPIC-ADD ON

## 2014-12-07 LAB — URINALYSIS, ROUTINE W REFLEX MICROSCOPIC
Bilirubin Urine: NEGATIVE
Glucose, UA: >1000 mg/dL — AB
KETONES UR: NEGATIVE mg/dL
LEUKOCYTES UA: NEGATIVE
NITRITE: POSITIVE — AB
Protein, ur: 100 mg/dL — AB
Specific Gravity, Urine: 1.022 (ref 1.005–1.030)
Urobilinogen, UA: 0.2 mg/dL (ref 0.0–1.0)
pH: 5 (ref 5.0–8.0)

## 2014-12-07 LAB — SEDIMENTATION RATE: Sed Rate: 70 mm/hr — ABNORMAL HIGH (ref 0–22)

## 2014-12-07 LAB — CBG MONITORING, ED: GLUCOSE-CAPILLARY: 516 mg/dL — AB (ref 70–99)

## 2014-12-07 MED ORDER — INSULIN ASPART 100 UNIT/ML ~~LOC~~ SOLN
10.0000 [IU] | Freq: Once | SUBCUTANEOUS | Status: AC
  Administered 2014-12-07: 10 [IU] via INTRAVENOUS
  Filled 2014-12-07: qty 1

## 2014-12-07 MED ORDER — SODIUM CHLORIDE 0.9 % IV BOLUS (SEPSIS)
1000.0000 mL | Freq: Once | INTRAVENOUS | Status: AC
  Administered 2014-12-07: 1000 mL via INTRAVENOUS

## 2014-12-07 MED ORDER — CLINDAMYCIN PHOSPHATE 600 MG/50ML IV SOLN
600.0000 mg | Freq: Once | INTRAVENOUS | Status: AC
  Administered 2014-12-07: 600 mg via INTRAVENOUS
  Filled 2014-12-07: qty 50

## 2014-12-07 NOTE — ED Notes (Signed)
CBG [phone]

## 2014-12-07 NOTE — ED Notes (Signed)
She has multiple issues she would like to be seen for. She has a painful red sore to L upper lip, painful abscesses under L armpit. She also states her RLE has been too swollen to fit her prosthesis on. She also reports loose stools since this morning.

## 2014-12-07 NOTE — ED Provider Notes (Signed)
CSN: [id number]     Arrival date & time 12/07/14  1656 History   First MD Initiated Contact with Patient 12/07/14 2004     Chief Complaint  Patient presents with  . Diarrhea  . Skin Problem     (Consider location/radiation/quality/duration/timing/severity/associated sxs/prior Treatment) Patient is a 54 y.o. female presenting with rash. The history is provided by the patient. No language interpreter was used.  Rash Location:  Leg and foot Leg rash location:  R knee Foot rash location:  Sole of L foot Quality: painful, redness and swelling   Pain details:    Quality:  Aching   Onset quality:  Gradual   Timing:  Constant   Progression:  Worsening Severity:  Moderate Onset quality:  Gradual Timing:  Constant Progression:  Worsening Chronicity:  New Relieved by:  Nothing Worsened by:  Nothing tried Ineffective treatments:  None tried Associated symptoms: diarrhea (1 x loose stool today) and nausea   Associated symptoms: no abdominal pain, no fatigue, no fever, no headaches, no myalgias, no shortness of breath and not wheezing     Past Medical History  Diagnosis Date  . Diabetic neuropathy   . Cellulitis   . Thyroid disease   . Hyperlipidemia   . Neuromuscular disorder   . Concussion as a teenager    slight  . Diarrhea     pt takes Reglan tid  . Hypothyroidism     takes Synthroid daily  . Depression     takes CYmbalta daily  . MRSA (methicillin resistant staph aureus) culture positive     hx of 2012  . Staph infection 2007  . Complication of anesthesia 01/2013    "didn't know where I was; who I was; talking out the top of my head" (April 30, 2013)  . Hypertension   . Peripheral vascular disease   . CHF (congestive heart failure)   . Pneumonia 1980's    "hospitalized w/it" (April 30, 2013)  . Chronic bronchitis   . Orthopnea     "just one; yesterday morning" (2013/04/30)  . Type II diabetes mellitus     Novolog,Levemir,and Lovaza daily  . GERD (gastroesophageal reflux  disease)   . Daily headache   . Rheumatoid arthritis     "both knees" (2013/04/30)  . Anxiety   . Kidney stone     "passed on before" (04/30/13)  . Non-healing wound of lower extremity     lt foot  . OSA on CPAP     and oxygen  . COPD (chronic obstructive pulmonary disease)     wear oxygen at home  . Symptomatic bradycardia 03/04/2014   Past Surgical History  Procedure Laterality Date  . Amputation    . Leg amputation below knee Right 2012  . Toe amputation Left ?2011     only 2 toes remaning.   . Tubal ligation  1990's  . Foot surgery Right 2012    "put pins in one year; amputated toes another OR" (April 30, 2013)  . Wrist surgery Right 1980's    "pinched nerve" (04-30-13)  . Dilation and curettage of uterus  1980's  . Refractive surgery Bilateral 2014  . Incision and drainage abscess N/A 01/24/2013    Procedure: INCISION AND DRAINAGE ABSCESS;  Surgeon: Clovis Pu. Cornett, MD;  Location: [facility] OR;  Service: General;  Laterality: N/A;  . Inguinal hernia repair N/A 01/28/2013    Procedure: I&D Right Groin Wound;  Surgeon: Shelly Rubenstein, MD;  Location: [facility] OR;  Service: General;  Laterality: N/A;  . Ganglion  cyst excision Left 1990's    "wrist" (04/21/2013)  . Bladder surgery  1970's    "stretched the mouth of my bladder" (04/21/2013)  . I&d extremity Left 08/25/2013    Procedure: IRRIGATION AND DEBRIDEMENT EXTREMITY;  Surgeon: Axel Filler, MD;  Location: [facility] OR;  Service: General;  Laterality: Left;  . Pacemaker insertion  03/05/14    Biotronik dual chamber pacemaker implanted by Dr Graciela Husbands for symptomatic bradycardia  . Permanent pacemaker insertion N/A 03/05/2014    Procedure: PERMANENT PACEMAKER INSERTION;  Surgeon: Duke Salvia, MD;  Location: [facility] CATH LAB;  Service: Cardiovascular;  Laterality: N/A;   Family History  Problem Relation Age of Onset  . Hypertension Mother   . Hyperlipidemia Mother   . Hypertrophic cardiomyopathy Mother     has pacer  . Hypertension Brother   .  Hyperlipidemia Brother   . Cancer Maternal Aunt   . Diabetes Paternal Aunt   . Diabetes Paternal Uncle   . Diabetes Paternal Grandmother   . Anesthesia problems Neg Hx   . Hypotension Neg Hx   . Malignant hyperthermia Neg Hx   . Pseudochol deficiency Neg Hx   . Alzheimer's disease Father    History  Substance Use Topics  . Smoking status: Current Every Day Smoker -- 1.00 packs/day for 33 years    Types: Cigarettes  . Smokeless tobacco: Never Used  . Alcohol Use: No   OB History    No data available     Review of Systems  Constitutional: Negative for fever and fatigue.  Eyes: Positive for visual disturbance.  Respiratory: Negative for cough, chest tightness, shortness of breath and wheezing.   Cardiovascular: Negative for chest pain.  Gastrointestinal: Positive for nausea and diarrhea (1 x loose stool today). Negative for abdominal pain.  Musculoskeletal: Positive for gait problem. Negative for myalgias.  Skin: Positive for rash (right BKA stump) and wound (left foot).  Neurological: Negative for weakness, light-headedness and headaches.  Psychiatric/Behavioral: Negative for confusion.  All other systems reviewed and are negative.     Allergies  Erythromycin; Gadolinium; Ivp dye; Metformin; and Penicillins  Home Medications   Prior to Admission medications   Medication Sig Start Date End Date Taking? Authorizing Provider  ADVAIR DISKUS 250-50 MCG/DOSE AEPB Inhale 1 puff into the lungs 2 (two) times daily. 08/25/14  Yes Historical Provider, MD  amLODipine (NORVASC) 5 MG tablet Take 5 mg by mouth daily. 08/31/14  Yes Historical Provider, MD  aspirin EC 81 MG tablet Take 81 mg by mouth daily.   Yes Historical Provider, MD  atorvastatin (LIPITOR) 80 MG tablet Take 80 mg by mouth daily.   Yes Historical Provider, MD  FLUoxetine (PROZAC) 20 MG tablet Take 60 mg by mouth.    Yes Historical Provider, MD  Fluticasone Furoate-Vilanterol (BREO ELLIPTA) 100-25 MCG/INH AEPB Inhale 1  puff into the lungs daily.   Yes Historical Provider, MD  furosemide (LASIX) 40 MG tablet Take 40 mg by mouth daily. 03/29/14  Yes Historical Provider, MD  gabapentin (NEURONTIN) 300 MG capsule Take 300 mg by mouth 3 (three) times daily. 06/12/14  Yes Historical Provider, MD  ibuprofen (ADVIL,MOTRIN) 200 MG tablet Take 400 mg by mouth every 6 (six) hours as needed for mild pain.   Yes Historical Provider, MD  insulin detemir (LEVEMIR) 100 UNIT/ML injection Inject 200 Units into the skin daily.    Yes Historical Provider, MD  levothyroxine (SYNTHROID, LEVOTHROID) 25 MCG tablet Take 25 mcg by mouth every morning.  Yes Historical Provider, MD  lisinopril (PRINIVIL,ZESTRIL) 5 MG tablet Take 5 mg by mouth daily. 06/16/14  Yes Historical Provider, MD  metoCLOPramide (REGLAN) 10 MG tablet Take 10 mg by mouth 4 (four) times daily.   Yes Historical Provider, MD  NOVOLOG FLEXPEN 100 UNIT/ML FlexPen Take 8-25 Units by mouth daily. Sliding scale 05/29/14  Yes Historical Provider, MD  omeprazole (PRILOSEC) 20 MG capsule Take 20 mg by mouth daily.   Yes Historical Provider, MD  oxybutynin (DITROPAN) 5 MG tablet Take 5 mg by mouth every morning.     Yes Historical Provider, MD  tiotropium (SPIRIVA) 18 MCG inhalation capsule Place 18 mcg into inhaler and inhale daily.   Yes Historical Provider, MD  ondansetron (ZOFRAN ODT) 8 MG disintegrating tablet Take 1 tablet (8 mg total) by mouth every 8 (eight) hours as needed for nausea. Patient not taking: Reported on 12/07/2014 09/15/14   Derwood Kaplan, MD   BP 131/48 mmHg  Pulse 80  Temp(Src) 98.8 F (37.1 C) (Oral)  Resp 28  Ht  (1.651 m)  Wt 230 lb (104.327 kg)  BMI 38.27 kg/m2  SpO2 91% Physical Exam  Constitutional: She is oriented to person, place, and time. She appears well-developed and well-nourished. No distress.  HENT:  Head: Normocephalic and atraumatic.  Nose: Nose normal.  Mouth/Throat: Oropharynx is clear and moist. No oropharyngeal exudate.    Left upper lip/medial cheek erythema and mild swelling, with scabbed wound above left cheek.  No underlying fluctuance palpable  Eyes: EOM are normal. Pupils are equal, round, and reactive to light.  Right lower eyelid syte.   Neck: Normal range of motion. Neck supple.  Cardiovascular: Normal rate, regular rhythm, normal heart sounds and intact distal pulses.   No murmur heard. Pulmonary/Chest: Effort normal and breath sounds normal. No respiratory distress. She has no wheezes. She exhibits no tenderness.  Abdominal: Soft. There is no tenderness. There is no rebound and no guarding.  Musculoskeletal: Normal range of motion. She exhibits tenderness (right stump).  3 x pinpoint abscesses at left axilla, s/p spontaneous rupture, one still draining purulent material.  Tender on right stump.   Lymphadenopathy:    She has no cervical adenopathy.  Neurological: She is alert and oriented to person, place, and time. No cranial nerve deficit. Coordination normal.  Skin: Skin is warm and dry. Lesion and rash noted. She is not diaphoretic.     Psychiatric: She has a normal mood and affect. Her behavior is normal. Judgment and thought content normal.  Nursing note and vitals reviewed.   ED Course  Procedures (including critical care time) Labs Review Labs Reviewed  CBC WITH DIFFERENTIAL/PLATELET - Abnormal; Notable for the following:    Hemoglobin 11.4 (*)    HCT 34.2 (*)    All other components within normal limits  COMPREHENSIVE METABOLIC PANEL - Abnormal; Notable for the following:    Sodium 128 (*)    Chloride 92 (*)    Glucose, Bld 532 (*)    BUN 33 (*)    Creatinine, Ser 1.65 (*)    Calcium 8.2 (*)    Albumin 2.3 (*)    Alkaline Phosphatase 121 (*)    GFR calc non Af Amer 34 (*)    GFR calc Af Amer 40 (*)    All other components within normal limits  URINALYSIS, ROUTINE W REFLEX MICROSCOPIC - Abnormal; Notable for the following:    APPearance CLOUDY (*)    Glucose, UA >1000 (*)  Hgb urine dipstick SMALL (*)    Protein, ur 100 (*)    Nitrite POSITIVE (*)    All other components within normal limits  SEDIMENTATION RATE - Abnormal; Notable for the following:    Sed Rate 70 (*)    All other components within normal limits  URINE MICROSCOPIC-ADD ON - Abnormal; Notable for the following:    Bacteria, UA FEW (*)    All other components within normal limits  CBG MONITORING, ED - Abnormal; Notable for the following:    Glucose-Capillary 516 (*)    All other components within normal limits  CULTURE, BLOOD (ROUTINE X 2)  CULTURE, BLOOD (ROUTINE X 2)  BLOOD GAS, VENOUS  C-REACTIVE PROTEIN   Imaging Review Dg Knee Complete 4 Views Right  12/07/2014   CLINICAL DATA:  Right distal stump infection.  EXAM: RIGHT KNEE - COMPLETE 4+ VIEW  COMPARISON:  12/20/2011  FINDINGS: Below the knee amputation. Mild hypertrophic changes at the osteotomy with associated sclerosis at the margin. No destructive or resorptive change. No appreciable soft tissue gas. Enthesopathic changes at the patella.  IMPRESSION: Below the knee amputation with mild sclerosis at the osteotomy margin which may be reactive or reflect sequelae of prior infection. Recommend MRI if clinical concern for acute osteomyelitis persists.   Electronically Signed   By: Jearld Lesch M.D.   On: 12/07/2014 22:37   Dg Foot 2 Views Left  12/07/2014   CLINICAL DATA:  Soft tissues stump infection.  EXAM: LEFT FOOT - 2 VIEW  COMPARISON:  05/03/2011  FINDINGS: There has been previous amputation of the great toe at the metatarsal level, the second toe an the third toe at the phalangeal level. Soft tissues shows swelling and areas of lucency consistent with infection. There is chronic cortical thickening in the metatarsal region, most pronounced at the second. There is no finding specific for osteomyelitis presently. Chronic degenerative/ neuropathic changes remain evident in the midfoot.  IMPRESSION: No plain radiographic finding  specific for osteomyelitis.   Electronically Signed   By: Paulina Fusi M.D.   On: 12/07/2014 22:35     EKG Interpretation None      MDM   Final diagnoses:  Cellulitis of right leg  Diabetic ulcer of left foot associated with type 2 diabetes mellitus  Abscess of left arm  Hyperglycemia  Lip abscess  Stye, right    Pt is a 54 yo F with hx of DM, HLD, hypothyroidism, COPD, PVD s/p left BKA, bradycardia with ICD placement, and retinopathy of diabetes who presents with multiple skin infectious complaints.  Patient has a right BKA with pain and erythema at the stump for the past few days.  She denies fever, myalgias, or nausea.  Patient also has a chronic left foot ulcer.  She is s/p amputation of the first several toes on her left due to PVD.  She is insensate at her distal LLE.  Scant purulence able to be expressed, no surrounding erythema of left foot.  Also has several small abscesses in left axilla that have spontaneously ruptured.  One of the abscesses was actively draining, and had a moderate amount of purulent drainage easily expressed.  Patient voiced relief in pain after this was expressed.  She has an erythematous area on her left upper lip/cheek with an associated scab, but no underlying fluctuance/no area to drain.  Right eye lower lid stye.   Patient reports that she has poor vision at baseline.  She is followed by optho and has  an appointment for her retinopathy on the 28th.  Because of her poor vision, patient has difficulty managing her diabetes.  She has difficulty reading the numbers on her glucometer and dialing in the right amounts on her insulin pins.  She states her mom is in town and helped her this weekend, and often had readings in the 400 range.  She took 20 units of novolog subQ this afternoon for a glucose of 450.    Due to her multiple abscesses and skin infections, blood cultures were obtained and she was started on clindamycin.   Xrays of her right knee stump and  left foot were ordered, both of which showed no underlying bony involvement of infection.   She was given 10 units of IV novolog and NS bolus for her hyperglycemia.   Na low, but ok when corrected for glucose.  UA negative for ketones.   Admitted to internal medicine teaching service for right leg cellulitis, left foot ulcer concerning for possible osteomyelitis, hyperglycemia, and assistance with setting up home health services on discharge as she is currently not able to take care of her DM or multiple wounds at home by herself.  Appreciate their help with this patient.   Patient was seen with ED Attending, Dr. Sharl Ma, MD   Lenell Antu, MD 12/08/14 [id number]  Dione Booze, MD 12/08/14 [phone]

## 2014-12-08 ENCOUNTER — Other Ambulatory Visit (HOSPITAL_COMMUNITY): Payer: Self-pay

## 2014-12-08 ENCOUNTER — Inpatient Hospital Stay (HOSPITAL_COMMUNITY): Payer: Medicare HMO

## 2014-12-08 DIAGNOSIS — E1142 Type 2 diabetes mellitus with diabetic polyneuropathy: Secondary | ICD-10-CM

## 2014-12-08 DIAGNOSIS — M069 Rheumatoid arthritis, unspecified: Secondary | ICD-10-CM

## 2014-12-08 DIAGNOSIS — E875 Hyperkalemia: Secondary | ICD-10-CM | POA: Diagnosis not present

## 2014-12-08 DIAGNOSIS — L02412 Cutaneous abscess of left axilla: Secondary | ICD-10-CM | POA: Diagnosis present

## 2014-12-08 DIAGNOSIS — E785 Hyperlipidemia, unspecified: Secondary | ICD-10-CM

## 2014-12-08 DIAGNOSIS — F419 Anxiety disorder, unspecified: Secondary | ICD-10-CM

## 2014-12-08 DIAGNOSIS — F329 Major depressive disorder, single episode, unspecified: Secondary | ICD-10-CM | POA: Diagnosis present

## 2014-12-08 DIAGNOSIS — R001 Bradycardia, unspecified: Secondary | ICD-10-CM | POA: Diagnosis present

## 2014-12-08 DIAGNOSIS — L0201 Cutaneous abscess of face: Secondary | ICD-10-CM | POA: Diagnosis present

## 2014-12-08 DIAGNOSIS — E039 Hypothyroidism, unspecified: Secondary | ICD-10-CM | POA: Diagnosis present

## 2014-12-08 DIAGNOSIS — D638 Anemia in other chronic diseases classified elsewhere: Secondary | ICD-10-CM | POA: Diagnosis present

## 2014-12-08 DIAGNOSIS — I5032 Chronic diastolic (congestive) heart failure: Secondary | ICD-10-CM | POA: Diagnosis present

## 2014-12-08 DIAGNOSIS — Z9981 Dependence on supplemental oxygen: Secondary | ICD-10-CM | POA: Diagnosis not present

## 2014-12-08 DIAGNOSIS — F17210 Nicotine dependence, cigarettes, uncomplicated: Secondary | ICD-10-CM | POA: Diagnosis present

## 2014-12-08 DIAGNOSIS — Z91041 Radiographic dye allergy status: Secondary | ICD-10-CM | POA: Diagnosis not present

## 2014-12-08 DIAGNOSIS — G4733 Obstructive sleep apnea (adult) (pediatric): Secondary | ICD-10-CM | POA: Diagnosis present

## 2014-12-08 DIAGNOSIS — Y839 Surgical procedure, unspecified as the cause of abnormal reaction of the patient, or of later complication, without mention of misadventure at the time of the procedure: Secondary | ICD-10-CM | POA: Diagnosis not present

## 2014-12-08 DIAGNOSIS — L03211 Cellulitis of face: Secondary | ICD-10-CM | POA: Diagnosis not present

## 2014-12-08 DIAGNOSIS — K219 Gastro-esophageal reflux disease without esophagitis: Secondary | ICD-10-CM | POA: Diagnosis present

## 2014-12-08 DIAGNOSIS — K130 Diseases of lips: Secondary | ICD-10-CM | POA: Diagnosis present

## 2014-12-08 DIAGNOSIS — I120 Hypertensive chronic kidney disease with stage 5 chronic kidney disease or end stage renal disease: Secondary | ICD-10-CM

## 2014-12-08 DIAGNOSIS — N189 Chronic kidney disease, unspecified: Secondary | ICD-10-CM

## 2014-12-08 DIAGNOSIS — T8740 Infection of amputation stump, unspecified extremity: Secondary | ICD-10-CM | POA: Diagnosis present

## 2014-12-08 DIAGNOSIS — E11319 Type 2 diabetes mellitus with unspecified diabetic retinopathy without macular edema: Secondary | ICD-10-CM | POA: Diagnosis present

## 2014-12-08 DIAGNOSIS — E1165 Type 2 diabetes mellitus with hyperglycemia: Secondary | ICD-10-CM | POA: Diagnosis present

## 2014-12-08 DIAGNOSIS — L03115 Cellulitis of right lower limb: Principal | ICD-10-CM

## 2014-12-08 DIAGNOSIS — J449 Chronic obstructive pulmonary disease, unspecified: Secondary | ICD-10-CM

## 2014-12-08 DIAGNOSIS — L03112 Cellulitis of left axilla: Secondary | ICD-10-CM

## 2014-12-08 DIAGNOSIS — Z7982 Long term (current) use of aspirin: Secondary | ICD-10-CM | POA: Diagnosis not present

## 2014-12-08 DIAGNOSIS — L97509 Non-pressure chronic ulcer of other part of unspecified foot with unspecified severity: Secondary | ICD-10-CM

## 2014-12-08 DIAGNOSIS — H00019 Hordeolum externum unspecified eye, unspecified eyelid: Secondary | ICD-10-CM | POA: Diagnosis present

## 2014-12-08 DIAGNOSIS — Z89512 Acquired absence of left leg below knee: Secondary | ICD-10-CM | POA: Diagnosis not present

## 2014-12-08 DIAGNOSIS — L97529 Non-pressure chronic ulcer of other part of left foot with unspecified severity: Secondary | ICD-10-CM | POA: Diagnosis present

## 2014-12-08 DIAGNOSIS — I472 Ventricular tachycardia: Secondary | ICD-10-CM | POA: Diagnosis present

## 2014-12-08 DIAGNOSIS — E861 Hypovolemia: Secondary | ICD-10-CM | POA: Diagnosis not present

## 2014-12-08 DIAGNOSIS — N179 Acute kidney failure, unspecified: Secondary | ICD-10-CM

## 2014-12-08 DIAGNOSIS — Z794 Long term (current) use of insulin: Secondary | ICD-10-CM | POA: Diagnosis not present

## 2014-12-08 DIAGNOSIS — G8918 Other acute postprocedural pain: Secondary | ICD-10-CM | POA: Diagnosis not present

## 2014-12-08 DIAGNOSIS — B9689 Other specified bacterial agents as the cause of diseases classified elsewhere: Secondary | ICD-10-CM | POA: Diagnosis not present

## 2014-12-08 DIAGNOSIS — I5030 Unspecified diastolic (congestive) heart failure: Secondary | ICD-10-CM

## 2014-12-08 DIAGNOSIS — T8743 Infection of amputation stump, right lower extremity: Secondary | ICD-10-CM | POA: Diagnosis not present

## 2014-12-08 DIAGNOSIS — E11621 Type 2 diabetes mellitus with foot ulcer: Secondary | ICD-10-CM

## 2014-12-08 DIAGNOSIS — I129 Hypertensive chronic kidney disease with stage 1 through stage 4 chronic kidney disease, or unspecified chronic kidney disease: Secondary | ICD-10-CM | POA: Diagnosis present

## 2014-12-08 DIAGNOSIS — Y835 Amputation of limb(s) as the cause of abnormal reaction of the patient, or of later complication, without mention of misadventure at the time of the procedure: Secondary | ICD-10-CM | POA: Diagnosis present

## 2014-12-08 DIAGNOSIS — Z950 Presence of cardiac pacemaker: Secondary | ICD-10-CM | POA: Diagnosis not present

## 2014-12-08 LAB — BASIC METABOLIC PANEL
ANION GAP: 9 (ref 5–15)
BUN: 31 mg/dL — ABNORMAL HIGH (ref 6–23)
CALCIUM: 8.3 mg/dL — AB (ref 8.4–10.5)
CO2: 26 mmol/L (ref 19–32)
Chloride: 98 mmol/L (ref 96–112)
Creatinine, Ser: 1.39 mg/dL — ABNORMAL HIGH (ref 0.50–1.10)
GFR calc non Af Amer: 42 mL/min — ABNORMAL LOW (ref >90)
GFR, EST AFRICAN AMERICAN: 49 mL/min — AB (ref >90)
Glucose, Bld: 455 mg/dL — ABNORMAL HIGH (ref 70–99)
Potassium: 4.2 mmol/L (ref 3.5–5.1)
Sodium: 133 mmol/L — ABNORMAL LOW (ref 135–145)

## 2014-12-08 LAB — CBC
HEMATOCRIT: 31.9 % — AB (ref 36.0–46.0)
Hemoglobin: 10.5 g/dL — ABNORMAL LOW (ref 12.0–15.0)
MCH: 28.3 pg (ref 26.0–34.0)
MCHC: 32.9 g/dL (ref 30.0–36.0)
MCV: 86 fL (ref 78.0–100.0)
Platelets: 151 10*3/uL (ref 150–400)
RBC: 3.71 MIL/uL — ABNORMAL LOW (ref 3.87–5.11)
RDW: 14.2 % (ref 11.5–15.5)
WBC: 9.3 10*3/uL (ref 4.0–10.5)

## 2014-12-08 LAB — GLUCOSE, CAPILLARY
GLUCOSE-CAPILLARY: 356 mg/dL — AB (ref 70–99)
GLUCOSE-CAPILLARY: 425 mg/dL — AB (ref 70–99)
Glucose-Capillary: 454 mg/dL — ABNORMAL HIGH (ref 70–99)
Glucose-Capillary: 315 mg/dL — ABNORMAL HIGH (ref 70–99)
Glucose-Capillary: 314 mg/dL — ABNORMAL HIGH (ref 70–99)

## 2014-12-08 LAB — C-REACTIVE PROTEIN: CRP: 27.6 mg/dL — AB (ref <0.60)

## 2014-12-08 LAB — CBG MONITORING, ED: GLUCOSE-CAPILLARY: 326 mg/dL — AB (ref 70–99)

## 2014-12-08 MED ORDER — AMLODIPINE BESYLATE 5 MG PO TABS
5.0000 mg | ORAL_TABLET | Freq: Every day | ORAL | Status: DC
  Administered 2014-12-09 – 2014-12-14 (×5): 5 mg via ORAL
  Filled 2014-12-08 (×6): qty 1

## 2014-12-08 MED ORDER — INSULIN DETEMIR 100 UNIT/ML ~~LOC~~ SOLN
50.0000 [IU] | Freq: Every day | SUBCUTANEOUS | Status: DC
  Administered 2014-12-08: 50 [IU] via SUBCUTANEOUS
  Filled 2014-12-08: qty 0.5

## 2014-12-08 MED ORDER — LISINOPRIL 5 MG PO TABS: 5.0000 mg | ORAL_TABLET | Freq: Every day | ORAL | Status: DC

## 2014-12-08 MED ORDER — FUROSEMIDE 40 MG PO TABS: 40.0000 mg | ORAL_TABLET | Freq: Every day | ORAL | Status: DC

## 2014-12-08 MED ORDER — INSULIN ASPART 100 UNIT/ML ~~LOC~~ SOLN
0.0000 [IU] | Freq: Three times a day (TID) | SUBCUTANEOUS | Status: DC
  Administered 2014-12-08: 11 [IU] via SUBCUTANEOUS
  Administered 2014-12-09: 8 [IU] via SUBCUTANEOUS
  Administered 2014-12-09: 15 [IU] via SUBCUTANEOUS
  Administered 2014-12-09: 11 [IU] via SUBCUTANEOUS
  Administered 2014-12-10: 3 [IU] via SUBCUTANEOUS
  Administered 2014-12-10 – 2014-12-11 (×3): 5 [IU] via SUBCUTANEOUS
  Administered 2014-12-12 (×2): 11 [IU] via SUBCUTANEOUS
  Administered 2014-12-12: 3 [IU] via SUBCUTANEOUS
  Administered 2014-12-13: 5 [IU] via SUBCUTANEOUS

## 2014-12-08 MED ORDER — GABAPENTIN 300 MG PO CAPS
300.0000 mg | ORAL_CAPSULE | Freq: Three times a day (TID) | ORAL | Status: DC
  Administered 2014-12-09 – 2014-12-14 (×15): 300 mg via ORAL
  Filled 2014-12-08 (×18): qty 1

## 2014-12-08 MED ORDER — CLINDAMYCIN PHOSPHATE 600 MG/50ML IV SOLN
600.0000 mg | Freq: Three times a day (TID) | INTRAVENOUS | Status: DC
  Administered 2014-12-08 – 2014-12-14 (×20): 600 mg via INTRAVENOUS
  Filled 2014-12-08 (×23): qty 50

## 2014-12-08 MED ORDER — LEVOTHYROXINE SODIUM 25 MCG PO TABS
25.0000 ug | ORAL_TABLET | Freq: Every day | ORAL | Status: DC
  Administered 2014-12-08 – 2014-12-14 (×6): 25 ug via ORAL
  Filled 2014-12-08 (×8): qty 1

## 2014-12-08 MED ORDER — SODIUM CHLORIDE 0.9 % IV SOLN
INTRAVENOUS | Status: AC
  Administered 2014-12-08: 03:00:00 via INTRAVENOUS

## 2014-12-08 MED ORDER — INSULIN ASPART 100 UNIT/ML ~~LOC~~ SOLN
0.0000 [IU] | Freq: Three times a day (TID) | SUBCUTANEOUS | Status: DC
  Administered 2014-12-08 (×2): 15 [IU] via SUBCUTANEOUS

## 2014-12-08 MED ORDER — METOCLOPRAMIDE HCL 10 MG PO TABS
10.0000 mg | ORAL_TABLET | Freq: Three times a day (TID) | ORAL | Status: DC
  Administered 2014-12-08 – 2014-12-14 (×22): 10 mg via ORAL
  Filled 2014-12-08 (×29): qty 1

## 2014-12-08 MED ORDER — SODIUM CHLORIDE 0.9 % IJ SOLN
3.0000 mL | Freq: Two times a day (BID) | INTRAMUSCULAR | Status: DC
  Administered 2014-12-08 – 2014-12-12 (×8): 3 mL via INTRAVENOUS

## 2014-12-08 MED ORDER — HEPARIN SODIUM (PORCINE) 5000 UNIT/ML IJ SOLN
5000.0000 [IU] | Freq: Three times a day (TID) | INTRAMUSCULAR | Status: DC
  Administered 2014-12-08 – 2014-12-12 (×5): 5000 [IU] via SUBCUTANEOUS
  Filled 2014-12-08 (×19): qty 1

## 2014-12-08 MED ORDER — ASPIRIN EC 81 MG PO TBEC
81.0000 mg | DELAYED_RELEASE_TABLET | Freq: Every day | ORAL | Status: DC
  Administered 2014-12-08 – 2014-12-14 (×6): 81 mg via ORAL
  Filled 2014-12-08 (×7): qty 1

## 2014-12-08 MED ORDER — CETYLPYRIDINIUM CHLORIDE 0.05 % MT LIQD
7.0000 mL | Freq: Two times a day (BID) | OROMUCOSAL | Status: DC
  Administered 2014-12-08 – 2014-12-14 (×9): 7 mL via OROMUCOSAL

## 2014-12-08 MED ORDER — HYDROCODONE-ACETAMINOPHEN 5-325 MG PO TABS
1.0000 | ORAL_TABLET | Freq: Once | ORAL | Status: AC
  Administered 2014-12-08: 1 via ORAL
  Filled 2014-12-08: qty 1

## 2014-12-08 MED ORDER — FLUOXETINE HCL 20 MG PO CAPS
60.0000 mg | ORAL_CAPSULE | Freq: Every day | ORAL | Status: DC
  Administered 2014-12-08 – 2014-12-14 (×6): 60 mg via ORAL
  Filled 2014-12-08 (×7): qty 3

## 2014-12-08 MED ORDER — OXYBUTYNIN CHLORIDE 5 MG PO TABS
5.0000 mg | ORAL_TABLET | Freq: Every day | ORAL | Status: DC
  Administered 2014-12-08 – 2014-12-13 (×5): 5 mg via ORAL
  Filled 2014-12-08 (×7): qty 1

## 2014-12-08 MED ORDER — INSULIN ASPART 100 UNIT/ML ~~LOC~~ SOLN
0.0000 [IU] | Freq: Every day | SUBCUTANEOUS | Status: DC
  Administered 2014-12-08: 4 [IU] via SUBCUTANEOUS
  Administered 2014-12-09: 2 [IU] via SUBCUTANEOUS
  Administered 2014-12-11: 4 [IU] via SUBCUTANEOUS
  Administered 2014-12-12: 2 [IU] via SUBCUTANEOUS
  Administered 2014-12-13: 3 [IU] via SUBCUTANEOUS

## 2014-12-08 MED ORDER — GLUCERNA SHAKE PO LIQD
237.0000 mL | Freq: Every day | ORAL | Status: DC
  Administered 2014-12-08 – 2014-12-14 (×5): 237 mL via ORAL

## 2014-12-08 MED ORDER — HYDROCODONE-ACETAMINOPHEN 5-325 MG PO TABS
1.0000 | ORAL_TABLET | Freq: Four times a day (QID) | ORAL | Status: DC | PRN
  Administered 2014-12-08 – 2014-12-11 (×8): 1 via ORAL
  Filled 2014-12-08 (×8): qty 1

## 2014-12-08 MED ORDER — INSULIN ASPART 100 UNIT/ML ~~LOC~~ SOLN
6.0000 [IU] | Freq: Three times a day (TID) | SUBCUTANEOUS | Status: DC
  Administered 2014-12-08: 6 [IU] via SUBCUTANEOUS

## 2014-12-08 MED ORDER — INSULIN DETEMIR 100 UNIT/ML ~~LOC~~ SOLN
75.0000 [IU] | Freq: Every day | SUBCUTANEOUS | Status: DC
  Administered 2014-12-09 – 2014-12-12 (×3): 75 [IU] via SUBCUTANEOUS
  Filled 2014-12-08 (×4): qty 0.75

## 2014-12-08 MED ORDER — MOMETASONE FURO-FORMOTEROL FUM 100-5 MCG/ACT IN AERO
2.0000 | INHALATION_SPRAY | Freq: Two times a day (BID) | RESPIRATORY_TRACT | Status: DC
  Administered 2014-12-08 – 2014-12-14 (×12): 2 via RESPIRATORY_TRACT
  Filled 2014-12-08: qty 8.8

## 2014-12-08 MED ORDER — PRO-STAT SUGAR FREE PO LIQD
30.0000 mL | Freq: Two times a day (BID) | ORAL | Status: DC
  Administered 2014-12-08 – 2014-12-14 (×10): 30 mL via ORAL
  Filled 2014-12-08 (×14): qty 30

## 2014-12-08 MED ORDER — TIOTROPIUM BROMIDE MONOHYDRATE 18 MCG IN CAPS
18.0000 ug | ORAL_CAPSULE | Freq: Every day | RESPIRATORY_TRACT | Status: DC
  Administered 2014-12-08 – 2014-12-14 (×6): 18 ug via RESPIRATORY_TRACT
  Filled 2014-12-08 (×2): qty 5

## 2014-12-08 MED ORDER — PANTOPRAZOLE SODIUM 40 MG PO TBEC
40.0000 mg | DELAYED_RELEASE_TABLET | Freq: Every day | ORAL | Status: DC
  Administered 2014-12-08 – 2014-12-14 (×6): 40 mg via ORAL
  Filled 2014-12-08 (×7): qty 1

## 2014-12-08 MED ORDER — ATORVASTATIN CALCIUM 80 MG PO TABS
80.0000 mg | ORAL_TABLET | Freq: Every day | ORAL | Status: DC
  Administered 2014-12-08 – 2014-12-14 (×6): 80 mg via ORAL
  Filled 2014-12-08 (×7): qty 1

## 2014-12-08 NOTE — Evaluation (Addendum)
Occupational Therapy Evaluation Patient Details Name: [name] MRN: [id number] DOB: 1960/12/13 Today's Date: 12/08/2014    History of Present Illness [name] is a 54 year old woman with history of type 2 diabetes with peripheral neuropathy, peripheral vascular disease status post right BKA, multiple skin infections, hypothyroidism, hypertension, hyperlipidemia, bradycardia status post pacemaker and ICD placement, CHF, GERD, rheumatoid arthritis, depression, anxiety, obstructive sleep apnea, and COPD on home oxygen presenting with swelling and pain of her right BKA stump. She reports developing redness and swelling at her right BKA stump over the last week making it difficult to wear her prosthesis. Sores at axillae and plantar aspect of L foot   Clinical Impression   Discussed with pt other d/c options besides her apartment which has stairs and pt states she doesn't have any local family and her mother lives in [place]. She is currently limited by R stump pain and sores on L plantar surface midfoot and R stump area. Awaiting ortho recommendations regarding plan of care and any activity precautions. Will follow on acute to progress ADL independence for next venue.     Follow Up Recommendations  SNF;Supervision/Assistance - 24 hour    Equipment Recommendations   (TBA)    Recommendations for Other Services       Precautions / Restrictions Precautions Precautions: Other (comment) Precaution Comments: Given sores on plantar surface of L foot and at R distal limb, I'm very hesitant to put weight on L foot, or through R prosthesis Restrictions Weight Bearing Restrictions: No Other Position/Activity Restrictions: At this point, would minimize weight bearing through either LE; Noted Ortho will consult; will await recs      Mobility Bed Mobility               Transfers        General transfer comment: not tested this visit.    Balance                                             ADL Overall ADL's : Needs assistance/impaired Eating/Feeding: Independent;Sitting   Grooming: Wash/dry face;Set up;Sitting   Upper Body Bathing: Set up;Sitting   Lower Body Bathing: Moderate assistance;Sitting/lateral leans   Upper Body Dressing : Set up;Sitting   Lower Body Dressing: Moderate assistance;Sitting/lateral leans                 General ADL Comments: Pt in chair and requesting to stay in chair for lunch. She states the bed is uncomfortable. Worked on side to side leans in the chair with unweighting each hip via pushing UEs through armrest of chair. Pt with difficulty currently being able to lean far enough to each side to adequately reach for periarea hygiene as well as she has difficulty reaching periarea to clean. Discused toilet aid option and pt states she does own a toilet aid but hasnt used it in quite awhile. Discussed options of A-P transfers depending on ortho recs for L LE and weightbearing. Demonstrated A-P transfer for pt and explained how this would work with a wheelchair and bed. Pt able to bring L LE up to her in the chair to don/doff sock but with much increased effort.      Vision     Perception     Praxis      Pertinent Vitals/Pain Pain Assessment: 0-10 Pain Score: 7  Pain Location: R stump  Pain Descriptors / Indicators: Aching Pain Intervention(s): Repositioned     Hand Dominance Left   Extremity/Trunk Assessment Upper Extremity Assessment Upper Extremity Assessment: Overall WFL for tasks assessed          Communication Communication Communication: No difficulties   Cognition Arousal/Alertness: Awake/alert Behavior During Therapy: WFL for tasks assessed/performed Overall Cognitive Status: Within Functional Limits for tasks assessed                     General Comments       Exercises       Shoulder Instructions      Home Living Family/patient expects to be discharged  to:: Private residence Living Arrangements: Alone Available Help at Discharge: Family;Available PRN/intermittently;Friend(s) Type of Home: Apartment Home Access: Stairs to enter Entrance Stairs-Number of Steps: 3 (2 on sidewalk to get in, 1 to get in) Entrance Stairs-Rails: None Home Layout: One level     Bathroom Shower/Tub: Producer, television/film/video: Standard     Home Equipment: Environmental consultant - 2 wheels;Cane - single point;Bedside commode;Wheelchair - manual;Hand held shower head;Shower seat;Grab bars - tub/shower;Adaptive equipment   Additional Comments: toilet aid      Prior Functioning/Environment Level of Independence: Independent with assistive device(s)        Comments: Pt's family assists with grocery shopping. Pt reports (I) with ADLs. Uses w/c for mobility recently as pt told not to ambulate with prosthesis secondary to wound on R stump. -- this per PT note 9 months ago; the wound at R residual limb persists, and pt has been using prosthesis here recently    OT Diagnosis: Generalized weakness;Acute pain   OT Problem List: Decreased strength;Decreased knowledge of use of DME or AE;Pain   OT Treatment/Interventions: Self-care/ADL training;Patient/family education;Therapeutic activities;DME and/or AE instruction    OT Goals(Current goals can be found in the care plan section) Acute Rehab OT Goals Patient Stated Goal: happy to sit in chair and work with OT OT Goal Formulation: With patient Time For Goal Achievement: 12/22/14 Potential to Achieve Goals: Good  OT Frequency: Min 2X/week   Barriers to D/C:            Co-evaluation              End of Session Equipment Utilized During Treatment: Oxygen  Activity Tolerance: Patient limited by pain Patient left: in chair;with call bell/phone within reach   Time: 1158-1220 OT Time Calculation (min): 22 min Charges:  OT General Charges $OT Visit: 1 Procedure OT Evaluation $Initial OT Evaluation Tier I: 1  Procedure G-Codes:    [name]  [phone] 12/08/2014, 12:52 PM

## 2014-12-08 NOTE — Progress Notes (Signed)
Patient ID: [name], female   DOB: 1961/04/08, 54 y.o.   MRN: [id number]  Nichole Campbell, MD   Nichole Code, PA-C [address], [place], [place]  [address]                             [phone]   ORTHOPAEDIC CONSULTATION  [name]            MRN:  [id number] DOB/SEX:  April 28, 1961/female    REQUESTING PHYSICIAN:    CHIEF COMPLAINT:  Painful right below knee amputation stump  HISTORY: [name] a 54 y.o. female , IDDM, with a prior history of a right BKA in 2012. She has done well with a prosthesis only to experience onset of pain, redness and swelling 4 days ago around the stump. Seen in the ER yesterdaymand admitted for further w/u with at least a cellulitiis . IV vancomycin has significantly decreased the area of erythema by at least 50%. Ultrasound of the stump did not suggest an abscess( pt has implanted defibrillator)   PAST MEDICAL HISTORY: Patient Active Problem List   Diagnosis Date Noted  . Diabetic foot ulcer 12/08/2014  . Symptomatic bradycardia 03/04/2014  . OSA on CPAP with oxygen 03/04/2014  . COPD (chronic obstructive pulmonary disease), on home O2 03/04/2014  . Bradycardia 03/04/2014  . Open wound of knee, leg (except thigh), and ankle, complicated 09/17/2013  . Cellulitis 08/16/2013  . Chronic diastolic heart failure 08/16/2013  . Sepsis 08/16/2013  . Left leg cellulitis 08/16/2013  . SOB (shortness of breath) 04/20/2013  . Proteinuria 01/26/2013  . Acute on chronic kidney failure 01/24/2013  . Abscess of groin, right 01/23/2013  . HTN (hypertension) 01/23/2013  . Leukocytosis 01/23/2013  . Anemia of chronic disease 01/23/2013  . Hypothyroidism 01/23/2013  . Hyponatremia 10/06/2011  . HYPERCHOLESTEROLEMIA 08/17/2010  . DIABETIC FOOT ULCER, TOE 02/18/2010  . DEPRESSION 02/01/2010  . TOBACCO ABUSE 06/04/2009  . PERIPHERAL NEUROPATHY 01/30/2008  . Diabetes mellitus type 2, uncontrolled, with complications 08/06/2007   Past  Medical History  Diagnosis Date  . Diabetic neuropathy   . Cellulitis   . Thyroid disease   . Hyperlipidemia   . Neuromuscular disorder   . Concussion as a teenager    slight  . Diarrhea     pt takes Reglan tid  . Hypothyroidism     takes Synthroid daily  . Depression     takes CYmbalta daily  . MRSA (methicillin resistant staph aureus) culture positive     hx of 2012  . Staph infection 2007  . Complication of anesthesia 01/2013    "didn't know where I was; who I was; talking out the top of my head" (05-21-2013)  . Hypertension   . Peripheral vascular disease   . CHF (congestive heart failure)   . Pneumonia 1980's    "hospitalized w/it" (05-21-2013)  . Chronic bronchitis   . Orthopnea     "just one; yesterday morning" (05-21-2013)  . Type II diabetes mellitus     Novolog,Levemir,and Lovaza daily  . GERD (gastroesophageal reflux disease)   . Daily headache   . Rheumatoid arthritis     "both knees" (05-21-13)  . Anxiety   . Kidney stone     "passed on before" (05/21/13)  . Non-healing wound of lower extremity     lt foot  . OSA on CPAP     and oxygen  . COPD (chronic obstructive pulmonary  disease)     wear oxygen at home  . Symptomatic bradycardia 03/04/2014   Past Surgical History  Procedure Laterality Date  . Amputation    . Leg amputation below knee Right 2012  . Toe amputation Left ?2011     only 2 toes remaning.   . Tubal ligation  1990's  . Foot surgery Right 2012    "put pins in one year; amputated toes another OR" (04/21/2013)  . Wrist surgery Right 1980's    "pinched nerve" (04/21/2013)  . Dilation and curettage of uterus  1980's  . Refractive surgery Bilateral 2014  . Incision and drainage abscess N/A 01/24/2013    Procedure: INCISION AND DRAINAGE ABSCESS;  Surgeon: Clovis Pu. Cornett, MD;  Location: [facility] OR;  Service: General;  Laterality: N/A;  . Inguinal hernia repair N/A 01/28/2013    Procedure: I&D Right Groin Wound;  Surgeon: Shelly Rubenstein, MD;  Location:  [facility] OR;  Service: General;  Laterality: N/A;  . Ganglion cyst excision Left 1990's    "wrist" (04/21/2013)  . Bladder surgery  1970's    "stretched the mouth of my bladder" (04/21/2013)  . I&d extremity Left 08/25/2013    Procedure: IRRIGATION AND DEBRIDEMENT EXTREMITY;  Surgeon: Axel Filler, MD;  Location: [facility] OR;  Service: General;  Laterality: Left;  . Pacemaker insertion  03/05/14    Biotronik dual chamber pacemaker implanted by Dr Graciela Husbands for symptomatic bradycardia  . Permanent pacemaker insertion N/A 03/05/2014    Procedure: PERMANENT PACEMAKER INSERTION;  Surgeon: Duke Salvia, MD;  Location: [facility] CATH LAB;  Service: Cardiovascular;  Laterality: N/A;    MEDICATIONS:   Current facility-administered medications:  .  [START ON 12/09/2014] amLODipine (NORVASC) tablet 5 mg, 5 mg, Oral, Daily, Yolanda Manges, DO .  antiseptic oral rinse (CPC / CETYLPYRIDINIUM CHLORIDE 0.05%) solution 7 mL, 7 mL, Mouth Rinse, BID, Aletta Edouard, MD, 7 mL at 12/08/14 0941 .  aspirin EC tablet 81 mg, 81 mg, Oral, Daily, Yolanda Manges, DO, 81 mg at 12/08/14 0948 .  atorvastatin (LIPITOR) tablet 80 mg, 80 mg, Oral, Daily, Yolanda Manges, DO, 80 mg at 12/08/14 0948 .  clindamycin (CLEOCIN) IVPB 600 mg, 600 mg, Intravenous, 3 times per day, Yolanda Manges, DO, 600 mg at 12/08/14 1342 .  feeding supplement (GLUCERNA SHAKE) (GLUCERNA SHAKE) liquid 237 mL, 237 mL, Oral, Q1500, Stephanie La, RD, 237 mL at 12/08/14 1346 .  feeding supplement (PRO-STAT SUGAR FREE 64) liquid 30 mL, 30 mL, Oral, BID, Stephanie La, RD, 30 mL at 12/08/14 1349 .  FLUoxetine (PROZAC) capsule 60 mg, 60 mg, Oral, Daily, Yolanda Manges, DO, 60 mg at 12/08/14 0947 .  [START ON 12/09/2014] gabapentin (NEURONTIN) capsule 300 mg, 300 mg, Oral, TID, Yolanda Manges, DO .  heparin injection 5,000 Units, 5,000 Units, Subcutaneous, 3 times per day, Yolanda Manges, DO, 5,000 Units at 12/08/14 1345 .  HYDROcodone-acetaminophen (NORCO/VICODIN) 5-325 MG per tablet 1  tablet, 1 tablet, Oral, Q6H PRN, Yolanda Manges, DO, 1 tablet at 12/08/14 0947 .  insulin aspart (novoLOG) injection 0-15 Units, 0-15 Units, Subcutaneous, TID WC, Harold Barban, MD .  insulin aspart (novoLOG) injection 0-5 Units, 0-5 Units, Subcutaneous, QHS, Harold Barban, MD .  insulin aspart (novoLOG) injection 6 Units, 6 Units, Subcutaneous, TID WC, Harold Barban, MD .  Melene Muller ON 12/09/2014] insulin detemir (LEVEMIR) injection 75 Units, 75 Units, Subcutaneous, Daily, Harold Barban, MD .  levothyroxine (SYNTHROID, LEVOTHROID) tablet 25 mcg, 25 mcg, Oral, QAC  breakfast, Yolanda Manges, DO, 25 mcg at 12/08/14 6381 .  metoCLOPramide (REGLAN) tablet 10 mg, 10 mg, Oral, TID AC & HS, Yolanda Manges, DO, 10 mg at 12/08/14 1337 .  mometasone-formoterol (DULERA) 100-5 MCG/ACT inhaler 2 puff, 2 puff, Inhalation, BID, Yolanda Manges, DO, 2 puff at 12/08/14 [id number] .  oxybutynin (DITROPAN) tablet 5 mg, 5 mg, Oral, Q breakfast, Yolanda Manges, DO, 5 mg at 12/08/14 6579 .  pantoprazole (PROTONIX) EC tablet 40 mg, 40 mg, Oral, Daily, Yolanda Manges, DO, 40 mg at 12/08/14 0954 .  sodium chloride 0.9 % injection 3 mL, 3 mL, Intravenous, Q12H, Yolanda Manges, DO, 3 mL at 12/08/14 0950 .  tiotropium [facility]) inhalation capsule 18 mcg, 18 mcg, Inhalation, Daily, Yolanda Manges, DO, 18 mcg at 12/08/14 0383  ALLERGIES:   Allergies  Allergen Reactions  . Erythromycin Diarrhea  . Gadolinium Nausea And Vomiting     [name]: VOM, Desc: Pt began vomiting immed post infusion of multihance, Onset Date: [id number]   . Ivp Dye [Iodinated Diagnostic Agents] Nausea And Vomiting    Nausea and vomiting   . Metformin Diarrhea     diarrhea  . Penicillins Hives    Hives     REVIEW OF SYSTEMS: REVIEWED IN DETAIL IN CHART  FAMILY HISTORY:   Family History  Problem Relation Age of Onset  . Hypertension Mother   . Hyperlipidemia Mother   . Hypertrophic cardiomyopathy Mother     has pacer  . Hypertension Brother   . Hyperlipidemia  Brother   . Cancer Maternal Aunt   . Diabetes Paternal Aunt   . Diabetes Paternal Uncle   . Diabetes Paternal Grandmother   . Anesthesia problems Neg Hx   . Hypotension Neg Hx   . Malignant hyperthermia Neg Hx   . Pseudochol deficiency Neg Hx   . Alzheimer's disease Father     SOCIAL HISTORY:   reports that she has been smoking Cigarettes.  She has a 33 pack-year smoking history. She has never used smokeless tobacco. She reports that she does not drink alcohol or use illicit drugs.   EXAMINATION: Vital signs in last 24 hours: Temp:  [98.1 F (36.7 C)-98.8 F (37.1 C)] 98.4 F (36.9 C) (04/19 1630) Pulse Rate:  [74-88] 76 (04/19 1630) Resp:  [11-32] 16 (04/19 1630) BP: (103-149)/(40-62) 129/56 mmHg (04/19 1630) SpO2:  [91 %-100 %] 92 % (04/19 1630) Weight:  [104.327 kg (230 lb)-105.87 kg (233 lb 6.4 oz)] 105.87 kg (233 lb 6.4 oz) (04/19 0307)    Musculoskeletal Exam  . Right BKA stump tender anteriorly with associated erythema.Based on the outline of erythema margin from yesterday the redness has resolved approx. 50-60%. No fluctuation. Pt able to actively flex and extend knee without much pain. WBC 10.5-9.3 in 24 hrs   DIAGNOSTIC STUDIES: Recent laboratory studies:  Recent Labs  12/07/14 1809 12/08/14 0700  WBC 10.5 9.3  HGB 11.4* 10.5*  HCT 34.2* 31.9*  PLT 151 151    Recent Labs  12/07/14 1809 12/08/14 0700  NA 128* 133*  K 4.4 4.2  CL 92* 98  CO2 24 26  BUN 33* 31*  CREATININE 1.65* 1.39*  GLUCOSE 532* 455*  CALCIUM 8.2* 8.3*   Lab Results  Component Value Date   INR 0.94 03/05/2014   INR 1.18 01/23/2013   INR 0.83 12/28/2011     Recent Radiographic Studies :  [place] Extrem Low Left Ltd  12/08/2014   CLINICAL DATA:  Left foot ulcer.  EXAM: ULTRASOUND left LOWER EXTREMITY LIMITED  TECHNIQUE: Ultrasound examination of the lower extremity soft tissues was performed in the area of clinical concern.  COMPARISON:  October 08, 2014.  FINDINGS: Sonographic  evaluation of the medial aspect of the left foot along plantar surface demonstrates no fluid collection or abscess.  IMPRESSION: No definite abscess or fluid collection seen in the area of concern.   Electronically Signed   By: Lupita Raider, M.D.   On: 12/08/2014 15:54   Dg Knee Complete 4 Views Right  12/07/2014   CLINICAL DATA:  Right distal stump infection.  EXAM: RIGHT KNEE - COMPLETE 4+ VIEW  COMPARISON:  12/20/2011  FINDINGS: Below the knee amputation. Mild hypertrophic changes at the osteotomy with associated sclerosis at the margin. No destructive or resorptive change. No appreciable soft tissue gas. Enthesopathic changes at the patella.  IMPRESSION: Below the knee amputation with mild sclerosis at the osteotomy margin which may be reactive or reflect sequelae of prior infection. Recommend MRI if clinical concern for acute osteomyelitis persists.   Electronically Signed   By: Jearld Lesch M.D.   On: 12/07/2014 22:37   Dg Foot 2 Views Left  12/07/2014   CLINICAL DATA:  Soft tissues stump infection.  EXAM: LEFT FOOT - 2 VIEW  COMPARISON:  05/03/2011  FINDINGS: There has been previous amputation of the great toe at the metatarsal level, the second toe an the third toe at the phalangeal level. Soft tissues shows swelling and areas of lucency consistent with infection. There is chronic cortical thickening in the metatarsal region, most pronounced at the second. There is no finding specific for osteomyelitis presently. Chronic degenerative/ neuropathic changes remain evident in the midfoot.  IMPRESSION: No plain radiographic finding specific for osteomyelitis.   Electronically Signed   By: Paulina Fusi M.D.   On: 12/07/2014 22:35    ASSESSMENT:appears that present stump pain is cellulitis and responding to IV vancomycin   PLAN: continue above with hopeful complete resolution-will follow Valeria Batman 12/08/2014, 4:44 PM

## 2014-12-08 NOTE — Progress Notes (Signed)
Pt stated that she doesn't use CPAP at home and doesn't want to use a machine here.  RT to monitor and assess as needed.

## 2014-12-08 NOTE — Progress Notes (Signed)
Subjective:  Patient states that she continues to have tenderness in her right BKA stump. She is also having pain from the lesion on her left upper lip.  Objective: Vital signs in last 24 hours: Filed Vitals:   12/08/14 0200 12/08/14 0230 12/08/14 0307 12/08/14 0530  BP: 103/41 114/51 131/40 124/61  Pulse: 76 82 83 74  Temp:   98.7 F (37.1 C) 98.7 F (37.1 C)  TempSrc:   Oral Oral  Resp: '14 17 16 18  ' Height:   '5\' 5"'  (1.651 m)   Weight:   233 lb 6.4 oz (105.87 kg)   SpO2: 93% 91% 91% 92%   Weight change:   Intake/Output Summary (Last 24 hours) at 12/08/14 1554 Last data filed at 12/08/14 1342  Gross per 24 hour  Intake   1320 ml  Output    600 ml  Net    720 ml    Constitutional: She is oriented to person, place, and time and well-developed, well-nourished, and in no distress. No distress.  Obese.  HENT:  Crusted lesion on left upper lip associated with swelling of inner left cheek soft tissues.  Eyes: Conjunctivae and EOM are normal. Pupils are equal, round, and reactive to light. Right eye exhibits discharge (mucous present). Left eye exhibits discharge. No scleral icterus.  Neck: Normal range of motion. Neck supple.  Cardiovascular: Normal rate and regular rhythm. Exam reveals no gallop and no friction rub.  Murmur (3/6 systolic) heard. Pulmonary/Chest: Effort normal and breath sounds normal. No respiratory distress. She has no wheezes.  Abdominal: Soft. Bowel sounds are normal. She exhibits no distension. There is no tenderness.  Musculoskeletal: Normal range of motion. She exhibits tenderness (right BKA stump). She exhibits no edema.  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  Neurological: She is alert and oriented to person, place, and time. No cranial nerve deficit. She exhibits normal muscle tone.  Skin: Skin is warm. She is not diaphoretic. There is erythema.  Erythema surrounding right BKA stump. Ulcer on plantar aspect of left  foot associated with swelling.   Lab Results: Basic Metabolic Panel:  Recent Labs Lab 12/07/14 1809 12/08/14 0700  NA 128* 133*  K 4.4 4.2  CL 92* 98  CO2 24 26  GLUCOSE 532* 455*  BUN 33* 31*  CREATININE 1.65* 1.39*  CALCIUM 8.2* 8.3*   Liver Function Tests:  Recent Labs Lab 12/07/14 1809  AST 20  ALT 18  ALKPHOS 121*  BILITOT 0.5  PROT 6.0  ALBUMIN 2.3*   No results for input(s): LIPASE, AMYLASE in the last 168 hours. No results for input(s): AMMONIA in the last 168 hours. CBC:  Recent Labs Lab 12/07/14 1809 12/08/14 0700  WBC 10.5 9.3  NEUTROABS 7.7  --   HGB 11.4* 10.5*  HCT 34.2* 31.9*  MCV 84.7 86.0  PLT 151 151   Cardiac Enzymes: No results for input(s): CKTOTAL, CKMB, CKMBINDEX, TROPONINI in the last 168 hours. BNP: No results for input(s): PROBNP in the last 168 hours. D-Dimer: No results for input(s): DDIMER in the last 168 hours. CBG:  Recent Labs Lab 12/07/14 1946 12/08/14 0156 12/08/14 0306 12/08/14 0747 12/08/14 1204  GLUCAP 516* 326* 356* 425* 454*   Hemoglobin A1C: No results for input(s): HGBA1C in the last 168 hours. Fasting Lipid Panel: No results for input(s): CHOL, HDL, LDLCALC, TRIG, CHOLHDL, LDLDIRECT in the last 168 hours. Thyroid Function Tests: No results for input(s): TSH, T4TOTAL, FREET4, T3FREE, THYROIDAB in  the last 168 hours. Coagulation: No results for input(s): LABPROT, INR in the last 168 hours. Anemia Panel: No results for input(s): VITAMINB12, FOLATE, FERRITIN, TIBC, IRON, RETICCTPCT in the last 168 hours. Urine Drug Screen: Drugs of Abuse     Component Value Date/Time   LABOPIA NONE DETECTED 04/21/2013 1531   COCAINSCRNUR NONE DETECTED 04/21/2013 1531   LABBENZ NONE DETECTED 04/21/2013 1531   AMPHETMU NONE DETECTED 04/21/2013 1531   THCU NONE DETECTED 04/21/2013 1531   LABBARB NONE DETECTED 04/21/2013 1531    Alcohol Level: No results for input(s): ETH in the last 168 hours. Urinalysis:  Recent  Labs Lab 12/07/14 2125  COLORURINE YELLOW  LABSPEC 1.022  PHURINE 5.0  GLUCOSEU >1000*  HGBUR SMALL*  BILIRUBINUR NEGATIVE  KETONESUR NEGATIVE  PROTEINUR 100*  UROBILINOGEN 0.2  NITRITE POSITIVE*  LEUKOCYTESUR NEGATIVE    Micro Results: No results found for this or any previous visit (from the past 240 hour(s)). Studies/Results: Dg Knee Complete 4 Views Right  12/07/2014   CLINICAL DATA:  Right distal stump infection.  EXAM: RIGHT KNEE - COMPLETE 4+ VIEW  COMPARISON:  12/20/2011  FINDINGS: Below the knee amputation. Mild hypertrophic changes at the osteotomy with associated sclerosis at the margin. No destructive or resorptive change. No appreciable soft tissue gas. Enthesopathic changes at the patella.  IMPRESSION: Below the knee amputation with mild sclerosis at the osteotomy margin which may be reactive or reflect sequelae of prior infection. Recommend MRI if clinical concern for acute osteomyelitis persists.   Electronically Signed   By: Carlos Levering M.D.   On: 12/07/2014 22:37   Dg Foot 2 Views Left  12/07/2014   CLINICAL DATA:  Soft tissues stump infection.  EXAM: LEFT FOOT - 2 VIEW  COMPARISON:  05/03/2011  FINDINGS: There has been previous amputation of the great toe at the metatarsal level, the second toe an the third toe at the phalangeal level. Soft tissues shows swelling and areas of lucency consistent with infection. There is chronic cortical thickening in the metatarsal region, most pronounced at the second. There is no finding specific for osteomyelitis presently. Chronic degenerative/ neuropathic changes remain evident in the midfoot.  IMPRESSION: No plain radiographic finding specific for osteomyelitis.   Electronically Signed   By: Nelson Chimes M.D.   On: 12/07/2014 22:35   Medications: I have reviewed the patient's current medications. Scheduled Meds: . [START ON 12/09/2014] amLODipine  5 mg Oral Daily  . antiseptic oral rinse  7 mL Mouth Rinse BID  . aspirin EC  81  mg Oral Daily  . atorvastatin  80 mg Oral Daily  . clindamycin (CLEOCIN) IV  600 mg Intravenous 3 times per day  . feeding supplement (GLUCERNA SHAKE)  237 mL Oral Q1500  . feeding supplement (PRO-STAT SUGAR FREE 64)  30 mL Oral BID  . FLUoxetine  60 mg Oral Daily  . [START ON 12/09/2014] gabapentin  300 mg Oral TID  . heparin  5,000 Units Subcutaneous 3 times per day  . insulin aspart  0-15 Units Subcutaneous TID WC  . insulin aspart  0-5 Units Subcutaneous QHS  . insulin aspart  6 Units Subcutaneous TID WC  . [START ON 12/09/2014] insulin detemir  75 Units Subcutaneous Daily  . levothyroxine  25 mcg Oral QAC breakfast  . metoCLOPramide  10 mg Oral TID AC & HS  . mometasone-formoterol  2 puff Inhalation BID  . oxybutynin  5 mg Oral Q breakfast  . pantoprazole  40 mg Oral Daily  .  sodium chloride  3 mL Intravenous Q12H  . tiotropium  18 mcg Inhalation Daily   Continuous Infusions:  PRN Meds:.HYDROcodone-acetaminophen Assessment/Plan: Principal Problem:   Cellulitis Active Problems:   Diabetes mellitus type 2, uncontrolled, with complications   TOBACCO ABUSE   HTN (hypertension)   Anemia of chronic disease   Hypothyroidism   Acute on chronic kidney failure   OSA on CPAP with oxygen   Diabetic foot ulcer   #Right lower extremity, left foot, left cheek, and left axilla cellulitis Patient continues to have some tenderness in her right BKA stump. No definitive evidence of osteomyelitis on plain film though with an elevated ESR of 70.  -Appreciate orthopedics consultation -Continue clindamycin IV -Will hold off on further imaging -Vicodin 5-325 mg every 6 hours when necessary. -Consult wound care. -PT and OT eval and treat. -CRP pending  #Type 2 diabetes with peripheral neuropathy and diabetic retinopathy Patient's blood glucose levels have been poorly controlled during this admission. Last A1c in our system 10.0 in July 2016. She takes Levemir 200 units daily at home along  with NovoLog sliding scale 8-25 units daily. -resistance sliding scale insulin -Increase Levemir from 50 units to 75 units -Start mealtime and nighttime coverage -Continue home gabapentin 300 mg 3 times a day. -Continue home metoclopramide 10 mg 4 times daily. -Continue home oxybutynin 5 mg daily.  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 tach on telemetry Follows with Dr. Caryl Comes of cardiology, pacemaker placed in July 2015 for sinus node dysfunction. Patient noted to intermittently be ventricular tachycardia on telemetry while in the emergency room. -will look into interrogation of device -Continue to monitor on telemetry  #Acute on chronic kidney failure Creatinine trending down from 1.65 to 1.39.  #COPD No acute issues. -Continue home Advair [place]) twice a day. -Continue home Spiriva daily. -Supple without oxygen with goal saturation of 92%.  #Diastolic congestive heart failure Echocardiogram from July 2015 with grade 2 diastolic dysfunction, ejection fraction previously normal. -Continue home aspirin 81 mg daily. -Continue home Lasix 40 mg daily.  #Hypothyroidism -Continue home Synthroid 25 g daily.  #Hypertension Currently normotensive in the ER. -Continue home amlodipine 5 mg daily and lisinopril 5 mg daily.  #Hyperlipidemia -Continue home atorvastatin 80 mg daily.  #GERD -Continue home PPI.  #Rheumatoid arthritis On ibuprofen at home. -Pain control as above.  #Depression/anxiety -Continue home Prozac 60 mg daily.  #DVT prophylaxis -Heparin.  Dispo: Disposition is deferred at this time, awaiting improvement of current medical problems. Anticipated discharge in approximately 3-5 day(s).   The patient does have a current PCP Marijean Bravo, MD), therefore will not be require OPC follow-up after discharge.   The patient does have transportation limitations that hinder transportation to clinic appointments.   LOS: 0 days   Services Needed  at time of discharge: Y = Yes, Blank = No PT:   OT:   RN:   Equipment:   Other:    Luan Moore, MD 12/08/2014, 3:54 PM

## 2014-12-08 NOTE — ED Notes (Signed)
Internal medicine consult at bedside.

## 2014-12-08 NOTE — H&P (Signed)
Date: 12/08/2014               Patient Name:  [name] MRN: [id number]  DOB: 12-Dec-1960 Age / Sex: 54 y.o., female   PCP: Marijean Bravo, MD              Medical Service: Internal Medicine Teaching Service              Attending Physician: Dr. Delora Fuel, MD    First Contact: Dr. Raelene Bott Pager: [phone]  Second Contact: Dr. Gordy Levan Pager: [phone]            After Hours (After 5p/  First Contact Pager: [phone]  weekends / holidays): Second Contact Pager: [phone]   Chief Complaint:  Swelling of right lower extremity stump.  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  She reports developing redness and swelling at her right BKA stump over the last week making it difficult to wear her prosthesis. The redness has expanded and she has had increasing pain over this time.  He also reports increased drainage from an ulcer on the bottom of her left foot. She has been following with Dr. Delora Fuel at [facility] regarding her ulcer, and he recently referred her to wound care who has been debriding the foot. Approximately a week ago, she also developed what initially appeared to be a pimple on her left lip. She tried to drain this without success, and she reports increasing swelling and pain of her left cheek, making it difficult for her to chew on the left side of her mouth. She reports having cold sores on her lips in the past. She has 2 additional abscesses in her left axilla, one of which spontaneously drained on its own. She also reports drainage from her eyes bilaterally for the past  couple of months, and she says she has a stye in her right eye.  She reports diaphoresis and cold chills in addition to intermittent nausea and vomiting over the past few days. She reports vomiting 2 times yesterday, but she denies any change in her appetite or inability to tolerate food.  In the ER, her vital signs were stable. She was given 1 L of normal saline, and started on clindamycin IV for R LE cellulitis.  Review of Systems: Review of Systems  Constitutional: Positive for chills and diaphoresis. Negative for fever and malaise/fatigue.  HENT: Negative for congestion and sore throat.   Eyes: Positive for blurred vision (Secondary to diabetic retinopathy.), discharge and redness. Negative for pain.  Respiratory: Negative for cough, sputum production and wheezing.   Cardiovascular: Negative for chest pain, claudication and PND.  Gastrointestinal: Positive for nausea and vomiting. Negative for diarrhea, constipation, blood in stool and melena.  Genitourinary: Negative for dysuria, urgency and frequency.  Musculoskeletal: Positive for myalgias and joint pain.  Skin: Positive for rash.  Neurological: Negative for dizziness, sensory change, focal weakness, weakness and headaches.    Meds:  (Not in a hospital admission) No current facility-administered medications for this encounter.   Current Outpatient Prescriptions  Medication Sig Dispense Refill  . ADVAIR DISKUS 250-50 MCG/DOSE AEPB Inhale 1 puff into the lungs 2 (two)  times daily.  11  . amLODipine (NORVASC) 5 MG tablet Take 5 mg by mouth daily.  6  . aspirin EC 81 MG tablet Take 81 mg by mouth daily.    Marland Kitchen atorvastatin (LIPITOR) 80 MG tablet Take 80 mg by mouth daily.    Marland Kitchen FLUoxetine (PROZAC) 20 MG tablet Take 60 mg by mouth.     . Fluticasone Furoate-Vilanterol (BREO ELLIPTA) 100-25 MCG/INH AEPB Inhale 1 puff into the lungs daily.    . furosemide (LASIX) 40 MG tablet Take 40 mg by mouth daily.  2  . gabapentin (NEURONTIN) 300 MG  capsule Take 300 mg by mouth 3 (three) times daily.  11  . ibuprofen (ADVIL,MOTRIN) 200 MG tablet Take 400 mg by mouth every 6 (six) hours as needed for mild pain.    Marland Kitchen insulin detemir (LEVEMIR) 100 UNIT/ML injection Inject 200 Units into the skin daily.     Marland Kitchen levothyroxine (SYNTHROID, LEVOTHROID) 25 MCG tablet Take 25 mcg by mouth every morning.     Marland Kitchen lisinopril (PRINIVIL,ZESTRIL) 5 MG tablet Take 5 mg by mouth daily.  11  . metoCLOPramide (REGLAN) 10 MG tablet Take 10 mg by mouth 4 (four) times daily.    Marland Kitchen NOVOLOG FLEXPEN 100 UNIT/ML FlexPen Take 8-25 Units by mouth daily. Sliding scale  6  . omeprazole (PRILOSEC) 20 MG capsule Take 20 mg by mouth daily.    Marland Kitchen oxybutynin (DITROPAN) 5 MG tablet Take 5 mg by mouth every morning.      . tiotropium (SPIRIVA) 18 MCG inhalation capsule Place 18 mcg into inhaler and inhale daily.    . ondansetron (ZOFRAN ODT) 8 MG disintegrating tablet Take 1 tablet (8 mg total) by mouth every 8 (eight) hours as needed for nausea. (Patient not taking: Reported on 12/07/2014) 20 tablet 0    Allergies: Allergies as of 12/07/2014 - Review Complete 12/07/2014  Allergen Reaction Noted  . Erythromycin Diarrhea 12/07/2014  . Gadolinium Nausea And Vomiting 03/28/2010  . Ivp dye [iodinated diagnostic agents] Nausea And Vomiting 10/06/2011  . Metformin Diarrhea 08/17/2010  . Penicillins Hives 01/30/2008   Past Medical History  Diagnosis Date  . Diabetic neuropathy   . Cellulitis   . Thyroid disease   . Hyperlipidemia   . Neuromuscular disorder   . Concussion as a teenager    slight  . Diarrhea     pt takes Reglan tid  . Hypothyroidism     takes Synthroid daily  . Depression     takes CYmbalta daily  . MRSA (methicillin resistant staph aureus) culture positive     hx of 2012  . Staph infection 2007  . Complication of anesthesia 01/2013    "didn't know where I was; who I was; talking out the top of my head" (May 03, 2013)  . Hypertension   . Peripheral vascular  disease   . CHF (congestive heart failure)   . Pneumonia 1980's    "hospitalized w/it" (03-May-2013)  . Chronic bronchitis   . Orthopnea     "just one; yesterday morning" (05-03-13)  . Type II diabetes mellitus     Novolog,Levemir,and Lovaza daily  . GERD (gastroesophageal reflux disease)   . Daily headache   . Rheumatoid arthritis     "both knees" (May 03, 2013)  . Anxiety   . Kidney stone     "passed on before" (05/03/13)  . Non-healing wound of lower extremity     lt foot  . OSA on CPAP     and oxygen  .  COPD (chronic obstructive pulmonary disease)     wear oxygen at home  . Symptomatic bradycardia 03/04/2014   Past Surgical History  Procedure Laterality Date  . Amputation    . Leg amputation below knee Right 2012  . Toe amputation Left ?2011     only 2 toes remaning.   . Tubal ligation  1990's  . Foot surgery Right 2012    "put pins in one year; amputated toes another OR" (04/21/2013)  . Wrist surgery Right 1980's    "pinched nerve" (04/21/2013)  . Dilation and curettage of uterus  1980's  . Refractive surgery Bilateral 2014  . Incision and drainage abscess N/A 01/24/2013    Procedure: INCISION AND DRAINAGE ABSCESS;  Surgeon: Joyice Faster. Cornett, MD;  Location: [place];  Service: General;  Laterality: N/A;  . Inguinal hernia repair N/A 01/28/2013    Procedure: I&D Right Groin Wound;  Surgeon: Harl Bowie, MD;  Location: [place];  Service: General;  Laterality: N/A;  . Ganglion cyst excision Left 1990's    "wrist" (04/21/2013)  . Bladder surgery  1970's    "stretched the mouth of my bladder" (04/21/2013)  . I&d extremity Left 08/25/2013    Procedure: IRRIGATION AND DEBRIDEMENT EXTREMITY;  Surgeon: Ralene Ok, MD;  Location: [place];  Service: General;  Laterality: Left;  . Pacemaker insertion  03/05/14    Biotronik dual chamber pacemaker implanted by Dr Caryl Comes for symptomatic bradycardia  . Permanent pacemaker insertion N/A 03/05/2014    Procedure: PERMANENT PACEMAKER INSERTION;   Surgeon: Deboraha Sprang, MD;  Location: [facility] CATH LAB;  Service: Cardiovascular;  Laterality: N/A;   Family History  Problem Relation Age of Onset  . Hypertension Mother   . Hyperlipidemia Mother   . Hypertrophic cardiomyopathy Mother     has pacer  . Hypertension Brother   . Hyperlipidemia Brother   . Cancer Maternal Aunt   . Diabetes Paternal Aunt   . Diabetes Paternal Uncle   . Diabetes Paternal Grandmother   . Anesthesia problems Neg Hx   . Hypotension Neg Hx   . Malignant hyperthermia Neg Hx   . Pseudochol deficiency Neg Hx   . Alzheimer's disease Father    History   Social History  . Marital Status: Single    Spouse Name: N/A  . Number of Children: N/A  . Years of Education: N/A   Occupational History  . Not on file.   Social History Main Topics  . Smoking status: Current Every Day Smoker -- 1.00 packs/day for 33 years    Types: Cigarettes  . Smokeless tobacco: Never Used  . Alcohol Use: No  . Drug Use: No  . Sexual Activity: Not on file   Other Topics Concern  . Not on file   Social History Narrative    Physical Exam: Filed Vitals:   12/08/14 0130  BP: 109/41  Pulse: 77  Temp: 98.1  Resp: 11   Physical Exam  Constitutional: She is oriented to person, place, and time and well-developed, well-nourished, and in no distress. No distress.  Obese.  HENT:  Crusted lesion on left upper lip associated with swelling of inner left cheek soft tissues.  Eyes: Conjunctivae and EOM are normal. Pupils are equal, round, and reactive to light. Right eye exhibits discharge (mucous present). Left eye exhibits discharge. No scleral icterus.  Neck: Normal range of motion. Neck supple.  Cardiovascular: Normal rate and regular rhythm.  Exam reveals no gallop and no friction rub.   Murmur (  3/6 systolic) heard. Pulmonary/Chest: Effort normal and breath sounds normal. No respiratory distress. She has no wheezes.  Abdominal: Soft. Bowel sounds are normal. She exhibits no  distension. There is no tenderness.  Musculoskeletal: Normal range of motion. She exhibits tenderness (right BKA stump). She exhibits no edema.  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  Neurological: She is alert and oriented to person, place, and time. No cranial nerve deficit. She exhibits normal muscle tone.  Skin: Skin is warm. She is not diaphoretic. There is erythema.  Erythema surrounding right BKA stump. Ulcer on plantar aspect of left foot associated with swelling.         Lab results: Basic Metabolic Panel:  Recent Labs  12/07/14 1809  NA 128*  K 4.4  CL 92*  CO2 24  GLUCOSE 532*  BUN 33*  CREATININE 1.65*  CALCIUM 8.2*   Liver Function Tests:  Recent Labs  12/07/14 1809  AST 20  ALT 18  ALKPHOS 121*  BILITOT 0.5  PROT 6.0  ALBUMIN 2.3*   CBC:  Recent Labs  12/07/14 1809  WBC 10.5  NEUTROABS 7.7  HGB 11.4*  HCT 34.2*  MCV 84.7  PLT 151   CBG:  Recent Labs  12/07/14 1946  GLUCAP 516*   Urine Drug Screen: Drugs of Abuse     Component Value Date/Time   LABOPIA NONE DETECTED 04/21/2013 1531   COCAINSCRNUR NONE DETECTED 04/21/2013 1531   LABBENZ NONE DETECTED 04/21/2013 1531   AMPHETMU NONE DETECTED 04/21/2013 1531   THCU NONE DETECTED 04/21/2013 1531   LABBARB NONE DETECTED 04/21/2013 1531    Urinalysis:  Recent Labs  12/07/14 2125  COLORURINE YELLOW  LABSPEC 1.022  PHURINE 5.0  GLUCOSEU >1000*  HGBUR SMALL*  BILIRUBINUR NEGATIVE  KETONESUR NEGATIVE  PROTEINUR 100*  UROBILINOGEN 0.2  NITRITE POSITIVE*  LEUKOCYTESUR NEGATIVE   Rare squamous epithelial cells, 0-2 white blood cells, 0-2 red blood cells, few bacteria, yeast.  Erythrocyte Sedimentation Rate     Component Value Date/Time   ESRSEDRATE 70* 12/07/2014 2049   Imaging results:  Dg Knee Complete 4 Views Right  12/07/2014   CLINICAL DATA:  Right distal stump infection.  EXAM: RIGHT KNEE - COMPLETE 4+ VIEW  COMPARISON:  12/20/2011   FINDINGS: Below the knee amputation. Mild hypertrophic changes at the osteotomy with associated sclerosis at the margin. No destructive or resorptive change. No appreciable soft tissue gas. Enthesopathic changes at the patella.  IMPRESSION: Below the knee amputation with mild sclerosis at the osteotomy margin which may be reactive or reflect sequelae of prior infection. Recommend MRI if clinical concern for acute osteomyelitis persists.   Electronically Signed   By: Carlos Levering M.D.   On: 12/07/2014 22:37   Dg Foot 2 Views Left  12/07/2014   CLINICAL DATA:  Soft tissues stump infection.  EXAM: LEFT FOOT - 2 VIEW  COMPARISON:  05/03/2011  FINDINGS: There has been previous amputation of the great toe at the metatarsal level, the second toe an the third toe at the phalangeal level. Soft tissues shows swelling and areas of lucency consistent with infection. There is chronic cortical thickening in the metatarsal region, most pronounced at the second. There is no finding specific for osteomyelitis presently. Chronic degenerative/ neuropathic changes remain evident in the midfoot.  IMPRESSION: No plain radiographic finding specific for osteomyelitis.   Electronically Signed   By: Nelson Chimes M.D.   On: 12/07/2014 22:35   Assessment & Plan by Problem:  Active Problems:   * No active hospital problems. *  #Right lower extremity, left foot, left cheek, and left axilla cellulitis Status post right BKA and left first through third toe amputations, likely due to combination of peripheral vascular disease and type 2 diabetes.  Several skin infections and abscesses previously drained. X-ray imaging demonstrates soft tissue swelling consistent with cellulitis and possible abscess of lower extremities. No definitive evidence of osteomyelitis on x-ray, but ESR elevated at 70. Started on clindamycin in the ER. She reports that her previous BKA and toe amputations were performed by Dr. Sharol Given. -Consult orthopedics in the  morning. -Continue clindamycin IV. -Consider MRI to rule out osteomyelitis. -Card modified diet. -Normal saline at 75 mL per hour for 12 hours. -Vicodin 5-325 mg every 6 hours when necessary. -Consult wound care. -PT and OT eval and treat. -Follow-up CRP.  #Type 2 diabetes with peripheral neuropathy and diabetic retinopathy Per the patient, she is planning to undergo a procedure to address her diabetic retinopathy in the next few weeks. Last A1c in our system 10.0 in July 2016. She takes Levemir 200 units daily at home along with NovoLog sliding scale 8-25 units daily. Hyperglycemic with glucose of 532 on presentation, no anion gap and asymptomatic. -CBG before meals at bedtime, sliding scale insulin moderate. -Start with Levemir 50 units in the morning and monitor blood sugars. -Continue home gabapentin 300 mg 3 times a day. -Continue home metoclopramide 10 mg 4 times daily. -Continue home oxybutynin 5 mg daily.  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 tach on telemetry Follows with Dr. Caryl Comes of cardiology, pacemaker placed in July 2015 for sinus node dysfunction. Patient noted to intermittently be ventricular tachycardia on telemetry while in the emergency room. -Check EKG. -Admit to telemetry.  #Acute on chronic kidney failure Creatinine slightly up from baseline of 1.12 on admission. Most likely prerenal. -IV fluids as above. -Trend creatinine.  #COPD Denies wheezing or shortness of breath currently. Intermittently takes her home meds. No pulmonary function tests currently in our system. She has both Advair and Breo Ellipta on her med list. She reports that she's supposed to be on 2 L of oxygen at home, but she does not usually use it. -Continue home Advair [facility]) twice a day. -Continue home Spiriva daily. -Supple without oxygen with goal saturation of 92%.  #Diastolic congestive heart failure Echocardiogram from July 2015 with grade 2 diastolic dysfunction,  ejection fraction previously normal. -Continue home aspirin 81 mg daily. -Continue home Lasix 40 mg daily.  #Hypothyroidism -Continue home Synthroid 25 g daily.  #Hypertension Currently normotensive in the ER. -Continue home amlodipine 5 mg daily and lisinopril 5 mg daily.  #Hyperlipidemia -Continue home atorvastatin 80 mg daily.  #GERD -Continue home PPI.  #Rheumatoid arthritis On ibuprofen at home. -Pain control as above.  #Depression/anxiety -Continue home Prozac 60 mg daily.  #DVT prophylaxis -Heparin.  Dispo: Disposition is deferred at this time, awaiting improvement of current medical problems. Anticipated discharge in approximately 3-6 day(s).   The patient does have a current PCP Marijean Bravo, MD), therefore will not be require OPC follow-up after discharge.   The patient does have transportation limitations that hinder transportation to clinic appointments.   Signed:  Arman Filter, MD, PhD PGY-1 Internal Medicine Teaching Service Pager: [phone] 12/08/2014, 12:20 AM

## 2014-12-08 NOTE — Evaluation (Signed)
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 She reports developing redness and swelling at her right BKA stump over the last week making it difficult to wear her prosthesis. Sores at axillae and plantar aspect of L foot  Clinical Impression   Pt admitted with above diagnosis. Pt currently with functional limitations due to the deficits listed below (see PT Problem List).  Pt will benefit from skilled PT to increase their independence and safety with mobility to allow discharge to the venue listed below.    I'm very hesitant to put any weight through either R or LLE for gait and transfers, given wounds at R distal limb and L plantar surface of midfoot; Worth considering post-acute rehab to maximize independence and safety with mobility, prior to dc home alone, likely at wheelchair level; Noted also SNF, ALF may be necessary for meds;   I offered to contact pt's prosthetist at [facility] [phone]) for them to evaluate her L foot and residual limb here in hospital, as it's clear her prosthesis needs to be altered to keep pressure off of wound; she could also use a specialty shoe L foot; she was very hesitant, and actually asked that I don't contact them, not wanting "another thing on her plate"  Unfortunately, [name]'s health literacy is likely not very good -- in a note 9 months ago, PT stated pt shouldn't wear prosthesis due to a wound on her residual limb, and she  continues with this wound and she wore her prosthesis with significantly increased pain even yesterday.     Follow Up Recommendations SNF (ALF with HHPT)    Equipment Recommendations  Wheelchair (measurements PT);Wheelchair cushion (measurements PT) (Does her wheelchair function properly?  )    Recommendations for Other Services OT consult     Precautions / Restrictions Precautions Precautions: Other (comment) Precaution Comments: Given sores on plantar surface of L foot and at R distal limb, I'm very hesitant to put weight on L foot, or through R prosthesis Restrictions Other Position/Activity Restrictions: At this point, would minimize weight bearing through either LE; Noted Ortho will consult; will await recs      Mobility  Bed Mobility Overal bed mobility: Modified Independent             General bed mobility comments: used bedrails  Transfers Overall transfer level: Needs assistance Equipment used: None Transfers: Sit to/from [employer] Transfers Sit to Stand: Min guard Stand pivot transfers: Min guard       General transfer comment: recliner positioned directly in front of pt, and she was able to stand with heavy use of momentum and pulling on armrests of recliner; Cues pt to keep wieght mostly on her heel due to sore at plantar surface of L midfoot  Ambulation/Gait             General Gait Details: Held any amb due to pressure areas   Stairs            Wheelchair Mobility    Modified Rankin (Stroke Patients Only)  Balance                                             Pertinent Vitals/Pain Pain Assessment: 0-10 Pain Score: 7  Pain Location: mostly R stump Pain Descriptors / Indicators: Aching Pain Intervention(s): Monitored during session;Limited activity within patient's tolerance;Repositioned    Home Living Family/patient expects to be discharged to:: Private residence Living Arrangements:  Alone Available Help at Discharge: Family;Available PRN/intermittently;Friend(s) Type of Home: Apartment Home Access: Stairs to enter Entrance Stairs-Rails: None Entrance Stairs-Number of Steps: 3 (2 on sidewalk to get in, 1 to get in) Home Layout: One level Home Equipment: Walker - 2 wheels;Cane - single point;Bedside commode;Wheelchair - manual;Tub bench;Hand held shower head      Prior Function Level of Independence: Independent with assistive device(s)         Comments: Pt's family assists with grocery shopping. Pt reports (I) with ADLs. Uses w/c for mobility recently as pt told not to ambulate with prosthesis secondary to wound on R stump. -- this per PT note 9 months ago; the wound at R residual limb persists, and pt has been using prosthesis here recently     Hand Dominance   Dominant Hand: Left    Extremity/Trunk Assessment   Upper Extremity Assessment: Overall WFL for tasks assessed (though pain L axilla)           Lower Extremity Assessment: Generalized weakness;RLE deficits/detail RLE Deficits / Details: Noted redeness and erythema distal residual limb (marked upon admission; small sore on anterior aspect of distal limb -- cousing terrible pain when pt wears prosthesis (which she reports she did yesterday)       Communication   Communication: No difficulties  Cognition Arousal/Alertness: Awake/alert Behavior During Therapy: WFL for tasks assessed/performed Overall Cognitive Status: Within Functional Limits for tasks assessed (question her health literacy)                      General Comments      Exercises        Assessment/Plan    PT Assessment Patient needs continued PT services  PT Diagnosis Difficulty walking;Acute pain;Generalized weakness   PT Problem List Decreased strength;Decreased range of motion;Decreased activity tolerance;Decreased mobility;Decreased knowledge of use of DME;Decreased knowledge of precautions;Pain;Decreased skin  integrity  PT Treatment Interventions DME instruction;Gait training;Stair training;Functional mobility training;Therapeutic activities;Therapeutic exercise;Balance training;Patient/family education;Wheelchair mobility training (gait if ok to bear weight L foot)   PT Goals (Current goals can be found in the Care Plan section) Acute Rehab PT Goals Patient Stated Goal: seemed to enjoy being OOB in the chair PT Goal Formulation: With patient Time For Goal Achievement: 12/22/14 Potential to Achieve Goals: Good    Frequency Min 3X/week   Barriers to discharge Inaccessible home environment;Decreased caregiver support lives alone and has steps to enter apartment; does she have any other dc options? perhaps to mother's home?    Co-evaluation               End of Session   Activity Tolerance: Patient tolerated treatment well (willing to get up despite pain) Patient left: in chair;with call bell/phone within reach;with family/visitor present Nurse Communication: Mobility status         Time: 5409-8119 PT Time Calculation (min) (ACUTE ONLY): 27 min   Charges:   PT Evaluation $Initial PT Evaluation Tier I: 1 Procedure PT  Treatments $Therapeutic Activity: 8-22 mins   PT G Codes:        Van Clines [facility] 12/08/2014, 11:57 AM  Van Clines, PT  Acute Rehabilitation Services Pager [phone] Office [phone]

## 2014-12-08 NOTE — Progress Notes (Addendum)
Report received. Awaiting for patient transfer to floor.

## 2014-12-08 NOTE — Care Management Note (Signed)
CARE MANAGEMENT NOTE 12/08/2014  Patient:  [name], [name]   Account Number:  [id number]  Date Initiated:  12/08/2014  Documentation initiated by:  Nicolaas Savo  Subjective/Objective Assessment:   CM following for progression and d/c planning.     Action/Plan:   Noted referral for assist in checking CBG and adm Insulin. This is not available on [name] daily basis, unless pt applies obtains PCS services and they are able to assist. Otherwise SNF or ALF may be needed.   Anticipated DC Date:  12/10/2014   Anticipated DC Plan:           Choice offered to / List presented to:             Status of service:  In process, will continue to follow Medicare Important Message given?   (If response is "NO", the following Medicare IM given date fields will be blank) Date Medicare IM given:   Medicare IM given by:   Date Additional Medicare IM given:   Additional Medicare IM given by:    Discharge Disposition:    Per UR Regulation:    If discussed at Long Length of Stay Meetings, dates discussed:    Comments:

## 2014-12-08 NOTE — ED Notes (Signed)
Report attempted, RN not available. 

## 2014-12-08 NOTE — Consult Note (Signed)
WOC evaluated the left foot wound, chronic neuropathic foot ulcer.  Has been followed by Dr. Romualdo Bolk in [facility] for this wound and has recently been referred to Dr. Carollee Massed at the [facility].  Per the patient she has had "everything" tried on her foot wounds. She has been in TCC, offloading boots, had serial debridements.  She has been admitted with multiple wounds and new onset of cellulitis on the R stump. She has a new lesion on her upper lip and she has two lesions in the left axilla.  The left foot wound is clean, pink, and moist in the base. Hypertrophic wound edges.  I have discussed with the patient use of silver gel, she is agreeable but after a long discussion with her it was discovered that she has been referred most recently from [facility] to Dr. Lajoyce Corners.  Dr. Lajoyce Corners is scheduled to consult on her.  I will not place any orders in the computer at this time pending his visit.  I have notified him of the same.  Gift Rueckert Fountain RN,CWOCN [phone]

## 2014-12-08 NOTE — Progress Notes (Signed)
New Admission Note:   Arrival Method: via stretcher from ED Mental Orientation: Alert and Oriented x4  Telemetry: Placed on box 6E13 Assessment: Completed Skin: Warm, dry. Right BKA with cellulitis marked. Left foot plantar aspect with diabetic foot ulcer cleansed and applied foam dressing. 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 Cracking to Left Heel. Canker sore to left upper lip. Healing blister to Left axilla. Sacrum pink, blanchable. Preventative foam dressing applied. IV: Left hand infusing Normal Saline at 75 mL/hr per MD order. Right upper arm Normal Saline locked Pain: Denies Tubes: N/A Safety Measures: Educated on fall prevention safety plan, patient acknowledged and understood. Admission: Completed [address] [place] Orientation: Patient has been orientated to the room, unit and staff.  Family: N/A  Orders have been reviewed and implemented. Will continue to monitor the patient. Call light has been placed within reach and bed alarm has been activated.    Billy Fischer, RN  Phone number: [phone]

## 2014-12-08 NOTE — Progress Notes (Signed)
Inpatient Diabetes Program Recommendations  AACE/ADA: New Consensus Statement on Inpatient Glycemic Control (2013)  Target Ranges:  Prepandial:   less than 140 mg/dL      Peak postprandial:   less than 180 mg/dL (1-2 hours)      Critically ill patients:  140 - 180 mg/dL     Results for KEILA, TURAN (MRN [id number]) as of 12/08/2014 14:01  Ref. Range 12/08/2014 01:56 12/08/2014 03:06 12/08/2014 07:47 12/08/2014 12:04  Glucose-Capillary Latest Ref Range: 70-99 mg/dL 035 (H) 597 (H) 416 (H) 454 (H)     Admit with: Swelling R LE Stump  History: DM, PVD, HTN, CHF, COPD  Home DM Meds: Levemir 200 units daily       Novolog 8-25 units tid per SSI  Current DM Orders: Levemir 50 units daily            Novolog Moderate SSI    **Note A1c pending  **Note Levemir 50 units started today at 9AM.  Patient's glucose levels are extremely elevated today despite Levemir and several large doses of Novolog SSI.    MD- If patient's CBGs do not improve by this afternoon, may want to consider starting IV insulin drip to safely being glucose levels down to goal  Otherwise, could consider increasing Levemir and adding Novolog meal coverage as well    Will follow Ambrose Finland RN, MSN, CDE Diabetes Coordinator Inpatient Diabetes Program Team Pager: [phone] (8a-5p)

## 2014-12-08 NOTE — Progress Notes (Signed)
INITIAL NUTRITION ASSESSMENT  DOCUMENTATION CODES Per approved criteria  -Obesity Unspecified   INTERVENTION: Glucerna Shake po once daily, each supplement provides 220 kcal and 10 grams of protein.  Provide 30 ml Prostat po BID, each supplement provides 100 kcal and 15 grams of protein.  NUTRITION DIAGNOSIS: Increased nutrient needs related to wound healing as evidenced by estimated nutrition needs.   Goal: Pt to meet >/= 90% of their estimated nutrition needs   Monitor:  PO intake, weight trends, labs, I/O's  Reason for Assessment: MD consult for wound healing  54 y.o. female  Admitting Dx: Cellulitis  ASSESSMENT: Pt with history of T2DM with peripheral neuropathy, PVD post right BKA, hypothyroidism, HTN, hyperlipidemia, bradycardia s/p pacemaker and ICD placement, CHF, GERD, rheumatoid arthritis, depression, anxiety, obstructive sleep apnea, and COPD presenting with swelling and pain of her right BKA stump.  Pt reports appetite is good currently and PTA at home eating at least 2 meals a day (mostly consumes sandwiches). Current meal completion is 75%. Weight has been stable. Pt with a diabetic foot ulcer and cellulitis. Pt is agreeable to supplements to aid in wound healing. RD to order Glucerna and prostat. Pt was encouraged to eat her food at meals.   Pt with no observed significant fat or muscle mass loss.   Labs: Low sodium, calcium, and GFR. High BUN and creatinine.   Height: Ht Readings from Last 1 Encounters:  12/08/14 5\' 5"  (1.651 m)    Weight: Wt Readings from Last 1 Encounters:  12/08/14 233 lb 6.4 oz (105.87 kg)    Ideal Body Weight: 117 lbs --- adjusted for R BKA  % Ideal Body Weight: 199%  Wt Readings from Last 10 Encounters:  12/08/14 233 lb 6.4 oz (105.87 kg)  09/15/14 230 lb (104.327 kg)  03/07/14 261 lb 12.8 oz (118.752 kg)  11/24/13 225 lb 9.6 oz (102.331 kg)  11/10/13 219 lb (99.338 kg)  09/30/13 219 lb 6.4 oz (99.519 kg)  08/27/13 228 lb  9.6 oz (103.692 kg)  05/05/13 221 lb 6.4 oz (100.426 kg)  04/22/13 218 lb 14.4 oz (99.292 kg)  04/04/13 224 lb 3.2 oz (101.696 kg)    Usual Body Weight: 233 lbs  % Usual Body Weight: 100%  BMI:  Body mass index is 38.84 kg/(m^2). Class II obesity  Estimated Nutritional Needs: Kcal: 2000-2200 Protein: 105-125 grams Fluid: 2-2.2 L/day  Skin: Diabetic ulcer L foot  Diet Order: Diet Carb Modified Fluid consistency:: Thin; Room service appropriate?: Yes  EDUCATION NEEDS: -No education needs identified at this time   Intake/Output Summary (Last 24 hours) at 12/08/14 0933 Last data filed at 12/08/14 0823  Gross per 24 hour  Intake    600 ml  Output    600 ml  Net      0 ml    Last BM: 4/18  Labs:   Recent Labs Lab 12/07/14 1809 12/08/14 0700  NA 128* 133*  K 4.4 4.2  CL 92* 98  CO2 24 26  BUN 33* 31*  CREATININE 1.65* 1.39*  CALCIUM 8.2* 8.3*  GLUCOSE 532* 455*    CBG (last 3)   Recent Labs  12/08/14 0156 12/08/14 0306 12/08/14 0747  GLUCAP 326* 356* 425*    Scheduled Meds: . [START ON 12/09/2014] amLODipine  5 mg Oral Daily  . antiseptic oral rinse  7 mL Mouth Rinse BID  . aspirin EC  81 mg Oral Daily  . atorvastatin  80 mg Oral Daily  . clindamycin (CLEOCIN) IV  600 mg Intravenous 3 times per day  . FLUoxetine  60 mg Oral Daily  . [START ON 12/09/2014] gabapentin  300 mg Oral TID  . heparin  5,000 Units Subcutaneous 3 times per day  . insulin aspart  0-15 Units Subcutaneous TID WC  . insulin detemir  50 Units Subcutaneous Daily  . levothyroxine  25 mcg Oral QAC breakfast  . metoCLOPramide  10 mg Oral TID AC & HS  . mometasone-formoterol  2 puff Inhalation BID  . oxybutynin  5 mg Oral Q breakfast  . pantoprazole  40 mg Oral Daily  . sodium chloride  3 mL Intravenous Q12H  . tiotropium  18 mcg Inhalation Daily    Continuous Infusions: . sodium chloride 75 mL/hr at 12/08/14 0309    Past Medical History  Diagnosis Date  . Diabetic  neuropathy   . Cellulitis   . Thyroid disease   . Hyperlipidemia   . Neuromuscular disorder   . Concussion as a teenager    slight  . Diarrhea     pt takes Reglan tid  . Hypothyroidism     takes Synthroid daily  . Depression     takes CYmbalta daily  . MRSA (methicillin resistant staph aureus) culture positive     hx of 2012  . Staph infection 2007  . Complication of anesthesia 01/2013    "didn't know where I was; who I was; talking out the top of my head" (2013-05-07)  . Hypertension   . Peripheral vascular disease   . CHF (congestive heart failure)   . Pneumonia 1980's    "hospitalized w/it" (2013-05-07)  . Chronic bronchitis   . Orthopnea     "just one; yesterday morning" (07-May-2013)  . Type II diabetes mellitus     Novolog,Levemir,and Lovaza daily  . GERD (gastroesophageal reflux disease)   . Daily headache   . Rheumatoid arthritis     "both knees" (05-07-2013)  . Anxiety   . Kidney stone     "passed on before" (05-07-2013)  . Non-healing wound of lower extremity     lt foot  . OSA on CPAP     and oxygen  . COPD (chronic obstructive pulmonary disease)     wear oxygen at home  . Symptomatic bradycardia 03/04/2014    Past Surgical History  Procedure Laterality Date  . Amputation    . Leg amputation below knee Right 2012  . Toe amputation Left ?2011     only 2 toes remaning.   . Tubal ligation  1990's  . Foot surgery Right 2012    "put pins in one year; amputated toes another OR" (07-May-2013)  . Wrist surgery Right 1980's    "pinched nerve" (2013/05/07)  . Dilation and curettage of uterus  1980's  . Refractive surgery Bilateral 2014  . Incision and drainage abscess N/A 01/24/2013    Procedure: INCISION AND DRAINAGE ABSCESS;  Surgeon: Clovis Pu. Cornett, MD;  Location: [facility] OR;  Service: General;  Laterality: N/A;  . Inguinal hernia repair N/A 01/28/2013    Procedure: I&D Right Groin Wound;  Surgeon: Shelly Rubenstein, MD;  Location: [facility] OR;  Service: General;  Laterality: N/A;   . Ganglion cyst excision Left 1990's    "wrist" (2013-05-07)  . Bladder surgery  1970's    "stretched the mouth of my bladder" (05/07/2013)  . I&d extremity Left 08/25/2013    Procedure: IRRIGATION AND DEBRIDEMENT EXTREMITY;  Surgeon: Axel Filler, MD;  Location: [facility] OR;  Service: General;  Laterality: Left;  . Pacemaker insertion  03/05/14    Biotronik dual chamber pacemaker implanted by Dr Graciela Husbands for symptomatic bradycardia  . Permanent pacemaker insertion N/A 03/05/2014    Procedure: PERMANENT PACEMAKER INSERTION;  Surgeon: Duke Salvia, MD;  Location: [facility] CATH LAB;  Service: Cardiovascular;  Laterality: N/A;    Marijean Niemann, MS, RD, LDN Pager # [phone] After hours/ weekend pager # [phone]

## 2014-12-09 ENCOUNTER — Inpatient Hospital Stay (HOSPITAL_COMMUNITY): Payer: Medicare HMO

## 2014-12-09 ENCOUNTER — Encounter (HOSPITAL_COMMUNITY): Payer: Self-pay | Admitting: *Deleted

## 2014-12-09 LAB — BASIC METABOLIC PANEL
Anion gap: 10 (ref 5–15)
BUN: 19 mg/dL (ref 6–23)
CALCIUM: 9.1 mg/dL (ref 8.4–10.5)
CO2: 26 mmol/L (ref 19–32)
Chloride: 100 mmol/L (ref 96–112)
Creatinine, Ser: 0.99 mg/dL (ref 0.50–1.10)
GFR calc Af Amer: 74 mL/min — ABNORMAL LOW (ref >90)
GFR calc non Af Amer: 64 mL/min — ABNORMAL LOW (ref >90)
GLUCOSE: 276 mg/dL — AB (ref 70–99)
Potassium: 4.2 mmol/L (ref 3.5–5.1)
SODIUM: 136 mmol/L (ref 135–145)

## 2014-12-09 LAB — CBC
HCT: 32 % — ABNORMAL LOW (ref 36.0–46.0)
HEMOGLOBIN: 10.4 g/dL — AB (ref 12.0–15.0)
MCH: 27.4 pg (ref 26.0–34.0)
MCHC: 32.5 g/dL (ref 30.0–36.0)
MCV: 84.4 fL (ref 78.0–100.0)
Platelets: 152 10*3/uL (ref 150–400)
RBC: 3.79 MIL/uL — ABNORMAL LOW (ref 3.87–5.11)
RDW: 13.9 % (ref 11.5–15.5)
WBC: 7.6 10*3/uL (ref 4.0–10.5)

## 2014-12-09 LAB — GLUCOSE, CAPILLARY
GLUCOSE-CAPILLARY: 290 mg/dL — AB (ref 70–99)
GLUCOSE-CAPILLARY: 247 mg/dL — AB (ref 70–99)
Glucose-Capillary: 370 mg/dL — ABNORMAL HIGH (ref 70–99)
Glucose-Capillary: 331 mg/dL — ABNORMAL HIGH (ref 70–99)

## 2014-12-09 LAB — HEMOGLOBIN A1C
Hgb A1c MFr Bld: 10.9 % — ABNORMAL HIGH (ref 4.8–5.6)
Mean Plasma Glucose: 266 mg/dL

## 2014-12-09 MED ORDER — INSULIN ASPART 100 UNIT/ML ~~LOC~~ SOLN
10.0000 [IU] | Freq: Three times a day (TID) | SUBCUTANEOUS | Status: DC
  Administered 2014-12-09 – 2014-12-10 (×4): 10 [IU] via SUBCUTANEOUS

## 2014-12-09 NOTE — Progress Notes (Signed)
Subjective:  Patient states that she continues have tenderness in her right BKA stump as well as the lesion in her left upper lip. She states that her left upper lip may be slightly improved proved in terms of drying out.  Objective: Vital signs in last 24 hours: Filed Vitals:   12/08/14 1630 12/08/14 2248 12/09/14 0442 12/09/14 0927  BP: 129/56 160/55 167/83 154/52  Pulse: 76 80 83 87  Temp: 98.4 F (36.9 C) 99 F (37.2 C) 98 F (36.7 C) 98.7 F (37.1 C)  TempSrc: Oral Oral Oral Axillary  Resp: 16 16 17 20   Height:      Weight:  243 lb 6.4 oz (110.406 kg)    SpO2: 92% 92% 93% 92%   Weight change: 13 lb 6.4 oz (6.078 kg)  Intake/Output Summary (Last 24 hours) at 12/09/14 1341 Last data filed at 12/09/14 12/11/14  Gross per 24 hour  Intake 2053.75 ml  Output    850 ml  Net 1203.75 ml    Constitutional: She is oriented to person, place, and time and well-developed, well-nourished, and in no distress. No distress.  Obese.  HENT:  Crusted lesion on left upper lip associated with swelling of inner left cheek soft tissues.  Eyes: Conjunctivae and EOM are normal. Pupils are equal, round, and reactive to light. Right eye exhibits discharge (mucous present). Left eye exhibits discharge. No scleral icterus.  Neck: Normal range of motion. Neck supple.  Cardiovascular: Normal rate and regular rhythm. Exam reveals no gallop and no friction rub.  Murmur (3/6 systolic) heard. Pulmonary/Chest: Effort normal and breath sounds normal. No respiratory distress. She has no wheezes.  Abdominal: Soft. Bowel sounds are normal. She exhibits no distension. There is no tenderness.  Musculoskeletal: Normal range of motion. She exhibits tenderness (right BKA stump). She exhibits no edema.  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  Neurological: She is alert and oriented to person, place, and time. No cranial nerve deficit. She exhibits normal muscle tone.  Skin: Skin is  warm. She is not diaphoretic. There is erythema.  Erythema surrounding right BKA stump. Ulcer on plantar aspect of left foot associated with swelling.   Lab Results: Basic Metabolic Panel:  Recent Labs Lab 12/08/14 0700 12/09/14 0503  NA 133* 136  K 4.2 4.2  CL 98 100  CO2 26 26  GLUCOSE 455* 276*  BUN 31* 19  CREATININE 1.39* 0.99  CALCIUM 8.3* 9.1   Liver Function Tests:  Recent Labs Lab 12/07/14 1809  AST 20  ALT 18  ALKPHOS 121*  BILITOT 0.5  PROT 6.0  ALBUMIN 2.3*   No results for input(s): LIPASE, AMYLASE in the last 168 hours. No results for input(s): AMMONIA in the last 168 hours. CBC:  Recent Labs Lab 12/07/14 1809 12/08/14 0700 12/09/14 0503  WBC 10.5 9.3 7.6  NEUTROABS 7.7  --   --   HGB 11.4* 10.5* 10.4*  HCT 34.2* 31.9* 32.0*  MCV 84.7 86.0 84.4  PLT 151 151 152   Cardiac Enzymes: No results for input(s): CKTOTAL, CKMB, CKMBINDEX, TROPONINI in the last 168 hours. BNP: No results for input(s): PROBNP in the last 168 hours. D-Dimer: No results for input(s): DDIMER in the last 168 hours. CBG:  Recent Labs Lab 12/08/14 0747 12/08/14 1204 12/08/14 1630 12/08/14 2231 12/09/14 0729 12/09/14 1125  GLUCAP 425* 454* 315* 314* 290* 370*   Hemoglobin A1C:  Recent Labs Lab 12/08/14 1800  HGBA1C 10.9*   Fasting  Lipid Panel: No results for input(s): CHOL, HDL, LDLCALC, TRIG, CHOLHDL, LDLDIRECT in the last 168 hours. Thyroid Function Tests: No results for input(s): TSH, T4TOTAL, FREET4, T3FREE, THYROIDAB in the last 168 hours. Coagulation: No results for input(s): LABPROT, INR in the last 168 hours. Anemia Panel: No results for input(s): VITAMINB12, FOLATE, FERRITIN, TIBC, IRON, RETICCTPCT in the last 168 hours. Urine Drug Screen: Drugs of Abuse     Component Value Date/Time   LABOPIA NONE DETECTED 04/21/2013 1531   COCAINSCRNUR NONE DETECTED 04/21/2013 1531   LABBENZ NONE DETECTED 04/21/2013 1531   AMPHETMU NONE DETECTED 04/21/2013  1531   THCU NONE DETECTED 04/21/2013 1531   LABBARB NONE DETECTED 04/21/2013 1531    Alcohol Level: No results for input(s): ETH in the last 168 hours. Urinalysis:  Recent Labs Lab 12/07/14 2125  COLORURINE YELLOW  LABSPEC 1.022  PHURINE 5.0  GLUCOSEU >1000*  HGBUR SMALL*  BILIRUBINUR NEGATIVE  KETONESUR NEGATIVE  PROTEINUR 100*  UROBILINOGEN 0.2  NITRITE POSITIVE*  LEUKOCYTESUR NEGATIVE    Micro Results: Recent Results (from the past 240 hour(s))  Blood culture (routine x 2)     Status: None (Preliminary result)   Collection Time: 12/07/14  8:49 PM  Result Value Ref Range Status   Specimen Description BLOOD LEFT WRIST  Final   Special Requests BOTTLES DRAWN AEROBIC AND ANAEROBIC 3CC EA  Final   Culture   Final           BLOOD CULTURE RECEIVED NO GROWTH TO DATE CULTURE WILL BE HELD FOR 5 DAYS BEFORE ISSUING A FINAL NEGATIVE REPORT Performed at [facility]    Report Status PENDING  Incomplete  Blood culture (routine x 2)     Status: None (Preliminary result)   Collection Time: 12/07/14  8:50 PM  Result Value Ref Range Status   Specimen Description BLOOD RIGHT ANTECUBITAL  Final   Special Requests BOTTLES DRAWN AEROBIC AND ANAEROBIC 5CC EA  Final   Culture   Final           BLOOD CULTURE RECEIVED NO GROWTH TO DATE CULTURE WILL BE HELD FOR 5 DAYS BEFORE ISSUING A FINAL NEGATIVE REPORT Performed at [facility]    Report Status PENDING  Incomplete   Studies/Results: [place] Extrem Low Left Ltd  12/08/2014   CLINICAL DATA:  Left foot ulcer.  EXAM: ULTRASOUND left LOWER EXTREMITY LIMITED  TECHNIQUE: Ultrasound examination of the lower extremity soft tissues was performed in the area of clinical concern.  COMPARISON:  October 08, 2014.  FINDINGS: Sonographic evaluation of the medial aspect of the left foot along plantar surface demonstrates no fluid collection or abscess.  IMPRESSION: No definite abscess or fluid collection seen in the area of concern.    Electronically Signed   By: Lupita Raider, M.D.   On: 12/08/2014 15:54   Dg Knee Complete 4 Views Right  12/07/2014   CLINICAL DATA:  Right distal stump infection.  EXAM: RIGHT KNEE - COMPLETE 4+ VIEW  COMPARISON:  12/20/2011  FINDINGS: Below the knee amputation. Mild hypertrophic changes at the osteotomy with associated sclerosis at the margin. No destructive or resorptive change. No appreciable soft tissue gas. Enthesopathic changes at the patella.  IMPRESSION: Below the knee amputation with mild sclerosis at the osteotomy margin which may be reactive or reflect sequelae of prior infection. Recommend MRI if clinical concern for acute osteomyelitis persists.   Electronically Signed   By: Jearld Lesch M.D.   On: 12/07/2014 22:37  Dg Foot 2 Views Left  12/07/2014   CLINICAL DATA:  Soft tissues stump infection.  EXAM: LEFT FOOT - 2 VIEW  COMPARISON:  05/03/2011  FINDINGS: There has been previous amputation of the great toe at the metatarsal level, the second toe an the third toe at the phalangeal level. Soft tissues shows swelling and areas of lucency consistent with infection. There is chronic cortical thickening in the metatarsal region, most pronounced at the second. There is no finding specific for osteomyelitis presently. Chronic degenerative/ neuropathic changes remain evident in the midfoot.  IMPRESSION: No plain radiographic finding specific for osteomyelitis.   Electronically Signed   By: Paulina Fusi M.D.   On: 12/07/2014 22:35   Medications: I have reviewed the patient's current medications. Scheduled Meds: . amLODipine  5 mg Oral Daily  . antiseptic oral rinse  7 mL Mouth Rinse BID  . aspirin EC  81 mg Oral Daily  . atorvastatin  80 mg Oral Daily  . clindamycin (CLEOCIN) IV  600 mg Intravenous 3 times per day  . feeding supplement (GLUCERNA SHAKE)  237 mL Oral Q1500  . feeding supplement (PRO-STAT SUGAR FREE 64)  30 mL Oral BID  . FLUoxetine  60 mg Oral Daily  . gabapentin  300 mg  Oral TID  . heparin  5,000 Units Subcutaneous 3 times per day  . insulin aspart  0-15 Units Subcutaneous TID WC  . insulin aspart  0-5 Units Subcutaneous QHS  . insulin aspart  10 Units Subcutaneous TID WC  . insulin detemir  75 Units Subcutaneous Daily  . levothyroxine  25 mcg Oral QAC breakfast  . metoCLOPramide  10 mg Oral TID AC & HS  . mometasone-formoterol  2 puff Inhalation BID  . oxybutynin  5 mg Oral Q breakfast  . pantoprazole  40 mg Oral Daily  . sodium chloride  3 mL Intravenous Q12H  . tiotropium  18 mcg Inhalation Daily   Continuous Infusions:  PRN Meds:.HYDROcodone-acetaminophen Assessment/Plan: Principal Problem:   Cellulitis Active Problems:   Diabetes mellitus type 2, uncontrolled, with complications   TOBACCO ABUSE   HTN (hypertension)   Anemia of chronic disease   Hypothyroidism   Acute on chronic kidney failure   OSA on CPAP with oxygen   Diabetic foot ulcer   #Right lower extremity, left foot, left cheek, and left axilla cellulitis Orthopedic surgery has evaluated the patient and is planning for an irrigation and debridement of an abscess at these sites of the right BKA stump. This is planned for Friday unless the infection resolves on its own. -Appreciate orthopedics consultation -Continue clindamycin IV -Ultrasound of right BKA stump for confirmation of abscess -Vicodin 5-325 mg every 6 hours when necessary. -Consult wound care. -PT and OT eval and treat.  #Type 2 diabetes with peripheral neuropathy and diabetic retinopathy :  Patient's blood glucoses are slightly improved from yesterday although still elevated between 276 and 315. Last A1c in our system 10.0 in July 2016. She takes Levemir 200 units daily at home along with NovoLog sliding scale 8-25 units daily. -resistance sliding scale insulin -Continue Levemir at 75 units -Continue mealtime and nighttime coverage (increase mealtime coverage to 10 units 3 times a day) -Continue home gabapentin  300 mg 3 times a day. -Continue home metoclopramide 10 mg 4 times daily. -Continue home oxybutynin 5 mg daily.  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 tach on telemetry Follows with Dr. 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 Patient noted to intermittently be ventricular tachycardia on telemetry while in the emergency room. -will look into interrogation of device -Continue to monitor on telemetry  #Acute on chronic kidney failure Creatinine continues to trend down to 0.99 from 1.39 yesterday.  #COPD No acute issues. -Continue home [facility]) twice a day. -Continue home Spiriva daily. -Supple without oxygen with goal saturation of 92%.  #Diastolic congestive heart failure Echocardiogram from July 2015 with grade 2 diastolic dysfunction, ejection fraction previously normal. -Continue home aspirin 81 mg daily. -Continue home Lasix 40 mg daily.  #Hypothyroidism -Continue home Synthroid 25 g daily.  #Hypertension Patient was slightly elevated blood pressures in the 150s to 160s over 50s to 80s. -Continue home amlodipine 5 mg daily. -Holding home lisinopril in the setting recent AKI. May consider resuming given stabilization.  #Hyperlipidemia -Continue home atorvastatin 80 mg daily.  #GERD -Continue home PPI.  #Rheumatoid arthritis On ibuprofen at home. -Pain control as above.  #Depression/anxiety -Continue home Prozac 60 mg daily.  #DVT prophylaxis -Heparin.  Dispo: Disposition is deferred at this time, pending possible surgery on Friday.  The patient does have a current PCP [name], MD), therefore will not be require OPC follow-up after discharge.   The patient does have transportation limitations that hinder transportation to clinic appointments.   LOS: 1 day   Services Needed at time of discharge: Y = Yes, Blank = No PT:   OT:   RN:   Equipment:   Other:    Harold Barban, MD 12/09/2014,  1:41 PM

## 2014-12-09 NOTE — Progress Notes (Signed)
Pt refused CPAP. Nurse Chama notified. No distress noted.

## 2014-12-09 NOTE — Consult Note (Signed)
Reason for Consult: Abscess cellulitis right transtibial amputation Referring Physician: Hospitalist  [name] is an 54 y.o. female.  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  Past Medical History  Diagnosis Date  . Diabetic neuropathy   . Cellulitis   . Thyroid disease   . Hyperlipidemia   . Neuromuscular disorder   . Concussion as a teenager    slight  . Diarrhea     pt takes Reglan tid  . Hypothyroidism     takes Synthroid daily  . Depression     takes CYmbalta daily  . MRSA (methicillin resistant staph aureus) culture positive     hx of 2012  . Staph infection 2007  . Complication of anesthesia 01/2013    "didn't know where I was; who I was; talking out the top of my head" (04/21/2013)  . Hypertension   . Peripheral vascular disease   . CHF (congestive heart failure)   . Pneumonia 1980's    "hospitalized w/it" (04/21/2013)  . Chronic bronchitis   . Orthopnea     "just one; yesterday morning" (04/21/2013)  . Type II diabetes mellitus     Novolog,Levemir,and Lovaza daily  . GERD (gastroesophageal reflux disease)   . Daily headache   . Rheumatoid arthritis     "both knees" (04/21/2013)  . Anxiety   . Kidney stone     "passed on before" (04/21/2013)  . Non-healing wound of lower extremity     lt foot  . OSA on CPAP     and oxygen  . COPD (chronic obstructive pulmonary disease)     wear oxygen at home  . Symptomatic bradycardia 03/04/2014    Past Surgical History  Procedure Laterality Date  . Amputation    . Leg amputation below knee Right 2012  . Toe amputation Left ?2011     only 2 toes remaning.   . Tubal ligation  1990's  . Foot surgery Right 2012    "put pins in one year; amputated toes another OR" (04/21/2013)  . Wrist surgery Right 1980's    "pinched nerve" (04/21/2013)   . Dilation and curettage of uterus  1980's  . Refractive surgery Bilateral 2014  . Incision and drainage abscess N/A 01/24/2013    Procedure: INCISION AND DRAINAGE ABSCESS;  Surgeon: Thomas A. Cornett, MD;  Location: [facility] OR;  Service: General;  Laterality: N/A;  . Inguinal hernia repair N/A 01/28/2013    Procedure: I&D Right Groin Wound;  Surgeon: Douglas A Blackman, MD;  Location: [facility] OR;  Service: General;  Laterality: N/A;  . Ganglion cyst excision Left 1990's    "wrist" (04/21/2013)  . Bladder surgery  1970's    "stretched the mouth of my bladder" (04/21/2013)  . I&d extremity Left 08/25/2013    Procedure: IRRIGATION AND DEBRIDEMENT EXTREMITY;  Surgeon: Armando Ramirez, MD;  Location: [facility] OR;  Service: General;  Laterality: Left;  . Pacemaker insertion  03/05/14    Biotronik dual chamber pacemaker implanted by Dr Klein for symptomatic bradycardia  . Permanent pacemaker insertion N/A 03/05/2014    Procedure: PERMANENT PACEMAKER INSERTION;  Surgeon: Steven C Klein, MD;  Location: [facility] CATH LAB;  Service: Cardiovascular;  Laterality: N/A;    Family History  Problem Relation Age of Onset  . Hypertension Mother   . Hyperlipidemia Mother   . Hypertrophic cardiomyopathy   Mother     has pacer  . Hypertension Brother   . Hyperlipidemia Brother   . Cancer Maternal Aunt   . Diabetes Paternal Aunt   . Diabetes Paternal Uncle   . Diabetes Paternal Grandmother   . Anesthesia problems Neg Hx   . Hypotension Neg Hx   . Malignant hyperthermia Neg Hx   . Pseudochol deficiency Neg Hx   . Alzheimer's disease Father     Social History:  reports that she has been smoking Cigarettes.  She has a 33 pack-year smoking history. She has never used smokeless tobacco. She reports that she does not drink alcohol or use illicit drugs.  Allergies:  Allergies  Allergen Reactions  . Erythromycin Diarrhea  . Gadolinium Nausea And Vomiting     Code: VOM, Desc: Pt began vomiting immed post infusion of multihance, Onset  Date: 08082011   . Ivp Dye [Iodinated Diagnostic Agents] Nausea And Vomiting    Nausea and vomiting   . Metformin Diarrhea     diarrhea  . Penicillins Hives    Hives     Medications: I have reviewed the patient's current medications.  Results for orders placed or performed during the hospital encounter of 12/07/14 (from the past 48 hour(s))  CBC with Differential     Status: Abnormal   Collection Time: 12/07/14  6:09 PM  Result Value Ref Range   WBC 10.5 4.0 - 10.5 K/uL   RBC 4.04 3.87 - 5.11 MIL/uL   Hemoglobin 11.4 (L) 12.0 - 15.0 g/dL   HCT 34.2 (L) 36.0 - 46.0 %   MCV 84.7 78.0 - 100.0 fL   MCH 28.2 26.0 - 34.0 pg   MCHC 33.3 30.0 - 36.0 g/dL   RDW 14.2 11.5 - 15.5 %   Platelets 151 150 - 400 K/uL   Neutrophils Relative % 73 43 - 77 %   Neutro Abs 7.7 1.7 - 7.7 K/uL   Lymphocytes Relative 19 12 - 46 %   Lymphs Abs 2.0 0.7 - 4.0 K/uL   Monocytes Relative 8 3 - 12 %   Monocytes Absolute 0.8 0.1 - 1.0 K/uL   Eosinophils Relative 0 0 - 5 %   Eosinophils Absolute 0.0 0.0 - 0.7 K/uL   Basophils Relative 0 0 - 1 %   Basophils Absolute 0.0 0.0 - 0.1 K/uL  Comprehensive metabolic panel     Status: Abnormal   Collection Time: 12/07/14  6:09 PM  Result Value Ref Range   Sodium 128 (L) 135 - 145 mmol/L   Potassium 4.4 3.5 - 5.1 mmol/L   Chloride 92 (L) 96 - 112 mmol/L   CO2 24 19 - 32 mmol/L   Glucose, Bld 532 (H) 70 - 99 mg/dL   BUN 33 (H) 6 - 23 mg/dL   Creatinine, Ser 1.65 (H) 0.50 - 1.10 mg/dL   Calcium 8.2 (L) 8.4 - 10.5 mg/dL   Total Protein 6.0 6.0 - 8.3 g/dL   Albumin 2.3 (L) 3.5 - 5.2 g/dL   AST 20 0 - 37 U/L   ALT 18 0 - 35 U/L   Alkaline Phosphatase 121 (H) 39 - 117 U/L   Total Bilirubin 0.5 0.3 - 1.2 mg/dL   GFR calc non Af Amer 34 (L) >90 mL/min   GFR calc Af Amer 40 (L) >90 mL/min    Comment: (NOTE) The eGFR has been calculated using the CKD EPI equation. This calculation has not been validated in all clinical situations. eGFR's persistently <90 mL/min    signify possible Chronic Kidney Disease.    Anion gap 12 5 - 15  POC CBG, ED     Status: Abnormal   Collection Time: 12/07/14  7:46 PM  Result Value Ref Range   Glucose-Capillary 516 (H) 70 - 99 mg/dL  Sedimentation rate     Status: Abnormal   Collection Time: 12/07/14  8:49 PM  Result Value Ref Range   Sed Rate 70 (H) 0 - 22 mm/hr  C-reactive protein     Status: Abnormal   Collection Time: 12/07/14  8:49 PM  Result Value Ref Range   CRP 27.6 (H) <0.60 mg/dL    Comment: Performed at [facility]  Urinalysis, Routine w reflex microscopic     Status: Abnormal   Collection Time: 12/07/14  9:25 PM  Result Value Ref Range   Color, Urine YELLOW YELLOW   APPearance CLOUDY (A) CLEAR   Specific Gravity, Urine 1.022 1.005 - 1.030   pH 5.0 5.0 - 8.0   Glucose, UA >1000 (A) NEGATIVE mg/dL   Hgb urine dipstick SMALL (A) NEGATIVE   Bilirubin Urine NEGATIVE NEGATIVE   Ketones, ur NEGATIVE NEGATIVE mg/dL   Protein, ur 100 (A) NEGATIVE mg/dL   Urobilinogen, UA 0.2 0.0 - 1.0 mg/dL   Nitrite POSITIVE (A) NEGATIVE   Leukocytes, UA NEGATIVE NEGATIVE  Urine microscopic-add on     Status: Abnormal   Collection Time: 12/07/14  9:25 PM  Result Value Ref Range   Squamous Epithelial / LPF RARE RARE   WBC, UA 0-2 <3 WBC/hpf   RBC / HPF 0-2 <3 RBC/hpf   Bacteria, UA FEW (A) RARE   Urine-Other YEAST   CBG monitoring, ED     Status: Abnormal   Collection Time: 12/08/14  1:56 AM  Result Value Ref Range   Glucose-Capillary 326 (H) 70 - 99 mg/dL  Glucose, capillary     Status: Abnormal   Collection Time: 12/08/14  3:06 AM  Result Value Ref Range   Glucose-Capillary 356 (H) 70 - 99 mg/dL  Basic metabolic panel     Status: Abnormal   Collection Time: 12/08/14  7:00 AM  Result Value Ref Range   Sodium 133 (L) 135 - 145 mmol/L   Potassium 4.2 3.5 - 5.1 mmol/L   Chloride 98 96 - 112 mmol/L   CO2 26 19 - 32 mmol/L   Glucose, Bld 455 (H) 70 - 99 mg/dL   BUN 31 (H) 6 - 23 mg/dL   Creatinine,  Ser 1.39 (H) 0.50 - 1.10 mg/dL   Calcium 8.3 (L) 8.4 - 10.5 mg/dL   GFR calc non Af Amer 42 (L) >90 mL/min   GFR calc Af Amer 49 (L) >90 mL/min    Comment: (NOTE) The eGFR has been calculated using the CKD EPI equation. This calculation has not been validated in all clinical situations. eGFR's persistently <90 mL/min signify possible Chronic Kidney Disease.    Anion gap 9 5 - 15  CBC     Status: Abnormal   Collection Time: 12/08/14  7:00 AM  Result Value Ref Range   WBC 9.3 4.0 - 10.5 K/uL   RBC 3.71 (L) 3.87 - 5.11 MIL/uL   Hemoglobin 10.5 (L) 12.0 - 15.0 g/dL   HCT 31.9 (L) 36.0 - 46.0 %   MCV 86.0 78.0 - 100.0 fL   MCH 28.3 26.0 - 34.0 pg   MCHC 32.9 30.0 - 36.0 g/dL   RDW 14.2 11.5 - 15.5 %   Platelets 151 150 -   400 K/uL  Glucose, capillary     Status: Abnormal   Collection Time: 12/08/14  7:47 AM  Result Value Ref Range   Glucose-Capillary 425 (H) 70 - 99 mg/dL  Glucose, capillary     Status: Abnormal   Collection Time: 12/08/14 12:04 PM  Result Value Ref Range   Glucose-Capillary 454 (H) 70 - 99 mg/dL  Glucose, capillary     Status: Abnormal   Collection Time: 12/08/14  4:30 PM  Result Value Ref Range   Glucose-Capillary 315 (H) 70 - 99 mg/dL  Hemoglobin A1c     Status: Abnormal   Collection Time: 12/08/14  6:00 PM  Result Value Ref Range   Hgb A1c MFr Bld 10.9 (H) 4.8 - 5.6 %    Comment: (NOTE)         Pre-diabetes: 5.7 - 6.4         Diabetes: >6.4         Glycemic control for adults with diabetes: <7.0    Mean Plasma Glucose 266 mg/dL    Comment: (NOTE) Performed At: [facility] [address] [place], [place] [id number] [place] William F MD Ph:[phone]   Glucose, capillary     Status: Abnormal   Collection Time: 12/08/14 10:31 PM  Result Value Ref Range   Glucose-Capillary 314 (H) 70 - 99 mg/dL  CBC     Status: Abnormal   Collection Time: 12/09/14  5:03 AM  Result Value Ref Range   WBC 7.6 4.0 - 10.5 K/uL   RBC 3.79 (L) 3.87 - 5.11 MIL/uL    Hemoglobin 10.4 (L) 12.0 - 15.0 g/dL   HCT 32.0 (L) 36.0 - 46.0 %   MCV 84.4 78.0 - 100.0 fL   MCH 27.4 26.0 - 34.0 pg   MCHC 32.5 30.0 - 36.0 g/dL   RDW 13.9 11.5 - 15.5 %   Platelets 152 150 - 400 K/uL  Basic metabolic panel     Status: Abnormal   Collection Time: 12/09/14  5:03 AM  Result Value Ref Range   Sodium 136 135 - 145 mmol/L   Potassium 4.2 3.5 - 5.1 mmol/L   Chloride 100 96 - 112 mmol/L   CO2 26 19 - 32 mmol/L   Glucose, Bld 276 (H) 70 - 99 mg/dL   BUN 19 6 - 23 mg/dL    Comment: DELTA CHECK NOTED   Creatinine, Ser 0.99 0.50 - 1.10 mg/dL   Calcium 9.1 8.4 - 10.5 mg/dL   GFR calc non Af Amer 64 (L) >90 mL/min   GFR calc Af Amer 74 (L) >90 mL/min    Comment: (NOTE) The eGFR has been calculated using the CKD EPI equation. This calculation has not been validated in all clinical situations. eGFR's persistently <90 mL/min signify possible Chronic Kidney Disease.    Anion gap 10 5 - 15    Us Extrem Low Left Ltd  12/08/2014   CLINICAL DATA:  Left foot ulcer.  EXAM: ULTRASOUND left LOWER EXTREMITY LIMITED  TECHNIQUE: Ultrasound examination of the lower extremity soft tissues was performed in the area of clinical concern.  COMPARISON:  October 08, 2014.  FINDINGS: Sonographic evaluation of the medial aspect of the left foot along plantar surface demonstrates no fluid collection or abscess.  IMPRESSION: No definite abscess or fluid collection seen in the area of concern.   Electronically Signed   By: James  Green Jr, M.D.   On: 12/08/2014 15:54   Dg Knee Complete 4 Views Right  12/07/2014     CLINICAL DATA:  Right distal stump infection.  EXAM: RIGHT KNEE - COMPLETE 4+ VIEW  COMPARISON:  12/20/2011  FINDINGS: Below the knee amputation. Mild hypertrophic changes at the osteotomy with associated sclerosis at the margin. No destructive or resorptive change. No appreciable soft tissue gas. Enthesopathic changes at the patella.  IMPRESSION: Below the knee amputation with mild  sclerosis at the osteotomy margin which may be reactive or reflect sequelae of prior infection. Recommend MRI if clinical concern for acute osteomyelitis persists.   Electronically Signed   By: Andrew  DelGaizo M.D.   On: 12/07/2014 22:37   Dg Foot 2 Views Left  12/07/2014   CLINICAL DATA:  Soft tissues stump infection.  EXAM: LEFT FOOT - 2 VIEW  COMPARISON:  05/03/2011  FINDINGS: There has been previous amputation of the great toe at the metatarsal level, the second toe an the third toe at the phalangeal level. Soft tissues shows swelling and areas of lucency consistent with infection. There is chronic cortical thickening in the metatarsal region, most pronounced at the second. There is no finding specific for osteomyelitis presently. Chronic degenerative/ neuropathic changes remain evident in the midfoot.  IMPRESSION: No plain radiographic finding specific for osteomyelitis.   Electronically Signed   By: Mark  Shogry M.D.   On: 12/07/2014 22:35    Review of Systems  All other systems reviewed and are negative.  Blood pressure 167/83, pulse 83, temperature 98 F (36.7 C), temperature source Oral, resp. rate 17, height 5' 5" (1.651 m), weight 110.406 kg (243 lb 6.4 oz), SpO2 93 %. Physical Exam On examination patient cellulitis is resolving on the right transtibial amputation however patient does have tenderness to palpation the local area of cellulitis which what appears to have fluctuance consistent with deep abscess. The Wagner grade 1 ulcer in the left foot is stable no signs of infection. Assessment/Plan: Assessment: Cellulitis abscess right transtibial amputation was stable ulceration left foot.  Plan: I will post the patient for irrigation and debridement of the transtibial amputation abscess on Friday if the infection resolves by Friday I will cancel her surgery.  Brayn Eckstein V 12/09/2014, 6:53 AM      

## 2014-12-09 NOTE — Progress Notes (Signed)
  PROGRESS NOTE MEDICINE TEACHING ATTENDING   Day 1 of stay Patient name: [name]   Medical record number: [id number] Date of birth: Oct 27, 1960   Met [name] with entire team this morning. She offers no new complaints.  Blood pressure 154/52, pulse 87, temperature 98.7 F (37.1 C), temperature source Axillary, resp. rate 20, height $RemoveBe'5\' 5"'LhQBfynFg$  (1.651 m), weight 243 lb 6.4 oz (110.406 kg), SpO2 92 %. Her right stump swelling is slightly decreased, or unchanged, its hard to say. The erythema seems to have decreased some and is now contained in a 5cm diameter. There is a new apparently pus filled blister emerging. I could not feel any fluctuance.   Reviewed Dr Jess Barters note, and he plans to do I&D on Friday if the wound is not better. He opines that there might be an abscess underneath however I could not feel fluctuance. We will do an ultrasound of the stump today to evaluate. We will continue clindamycin IV. For DM treatment - we plan to go up on mealtime insulin to balance it better with basal dose.   I have discussed the care of this patient with my IM team residents. Please see the resident note for details.  [place], [place] 12/09/2014, 1:41 PM.

## 2014-12-09 NOTE — Consult Note (Signed)
WOC wound follow up Wound type: neuropathic foot ulcer, chronic history x 1 year.  Measurement: 1.5cm x 1.0cm x 0.5cm  Wound bed: 100% pink, non granular, dry.  Drainage (amount, consistency, odor) none Periwound: hyperkeratotic Dressing procedure/placement/frequency: Will add silver gel for management of bio burden and to add moisture to the wound bed.  Will need follow up in wound care center of her choice at the time of DC for this wound.  Needs offloading of the ulcer, has orthotic for this foot. Orthopedics consulted on R stump.   Discussed POC with patient   Re consult if needed, will not follow at this time. Thanks  Ivo Moga [employer], CWOCN [phone])

## 2014-12-09 NOTE — Progress Notes (Addendum)
Inpatient Diabetes Program Recommendations  AACE/ADA: New Consensus Statement on Inpatient Glycemic Control (2013)  Target Ranges:  Prepandial:   less than 140 mg/dL      Peak postprandial:   less than 180 mg/dL (1-2 hours)      Critically ill patients:  140 - 180 mg/dL    Results for [name], [name] (MRN [id number]) as of 12/09/2014 12:58  Ref. Range 12/08/2014 01:56 12/08/2014 03:06 12/08/2014 07:47 12/08/2014 12:04 12/08/2014 16:30 12/08/2014 22:31  Glucose-Capillary Latest Ref Range: 70-99 mg/dL 409 (H) 811 (H) 914 (H) 454 (H) 315 (H) 314 (H)    Results for [name], [name] (MRN [id number]) as of 12/09/2014 12:58  Ref. Range 12/09/2014 07:29 12/09/2014 11:25  Glucose-Capillary Latest Ref Range: 70-99 mg/dL 086 (H) 578 (H)     Admit with: Swelling R LE Stump  History: DM, PVD, HTN, CHF, COPD  Home DM Meds: Levemir 200 units daily  Novolog 8-25 units tid per SSI  Current DM Orders: Levemir 75 units daily  Novolog Moderate SSI            Novolog 10 units tidwc    **Note Levemir dose increased to 75 units daily today.  **Also note 10 units Novolog Meal Coverage added this AM.    Spoke with patient this AM about her elevated A1c of 10.9%.  Explained what an A1c is and what it measures.  Reminded patient that her goal A1c is 7% or less per ADA standards to prevent both acute and long-term complications.  Explained to patient the extreme importance of good glucose control at home.  Encouraged patient to check her CBGs at least tid at home and to record all CBGs in a logbook for their PCP to review.  Patient told me she sees Dr. Reche Dixon with [facility] for regular medical care.  Dr. Reche Dixon also prescribes insulin for this patient.  Patient told me she uses the Novolog insulin pen for her SSI but uses Levemir vial for her her long-acting insulin.  Patient stated her Medicaid will only cover the Novolog  insulin pen but won't cover the Levemir insulin pen.  Patient stated she can hear the clicks on the Novolog pen so she doesn't have any difficulty with the Novolog insulin. However, patient stated she cannot see the lines on the insulin syringes for the Levemir.  Patient stated she uses 100 unit syringes at home and knows to pull back to the dark line at the bottom of the syringe for her dose of Levemir (draws up 100 units in two separate syringes).  Patient stated she has concerns about how she will get home (cannot drive and does not have family in town) and patient also stated she was wondering if she could get a nurse to come to her house to help her with the Levemir insulin.  Notified Care management of patient's concerns.  Asked patient if she has any neighbors that could help her draw up her Levemir dose.  Patient stated she has one neighbor who may be able to help in the evening.  Explained to patient that she can switch her Levemir dosing to the evening if needed (if she can get help from her neighbor in the evening).  Explained to patient that Levemir can be given in the AM or PM and that it just needs to be given every 24 hours.  Patient stated she would see about her neighbor helping her.     Will follow Donia Ast  Orlinda Blalock RN, MSN, CDE Diabetes Coordinator Inpatient Diabetes Program Team Pager: [phone] (8a-5p)

## 2014-12-10 ENCOUNTER — Other Ambulatory Visit (HOSPITAL_COMMUNITY): Payer: Self-pay | Admitting: Orthopedic Surgery

## 2014-12-10 ENCOUNTER — Inpatient Hospital Stay (HOSPITAL_COMMUNITY): Payer: Medicare HMO

## 2014-12-10 DIAGNOSIS — Z950 Presence of cardiac pacemaker: Secondary | ICD-10-CM

## 2014-12-10 DIAGNOSIS — I472 Ventricular tachycardia: Secondary | ICD-10-CM

## 2014-12-10 DIAGNOSIS — Z95810 Presence of automatic (implantable) cardiac defibrillator: Secondary | ICD-10-CM

## 2014-12-10 DIAGNOSIS — B9689 Other specified bacterial agents as the cause of diseases classified elsewhere: Secondary | ICD-10-CM

## 2014-12-10 DIAGNOSIS — F418 Other specified anxiety disorders: Secondary | ICD-10-CM

## 2014-12-10 DIAGNOSIS — I130 Hypertensive heart and chronic kidney disease with heart failure and stage 1 through stage 4 chronic kidney disease, or unspecified chronic kidney disease: Secondary | ICD-10-CM

## 2014-12-10 DIAGNOSIS — Y839 Surgical procedure, unspecified as the cause of abnormal reaction of the patient, or of later complication, without mention of misadventure at the time of the procedure: Secondary | ICD-10-CM

## 2014-12-10 DIAGNOSIS — E1140 Type 2 diabetes mellitus with diabetic neuropathy, unspecified: Secondary | ICD-10-CM

## 2014-12-10 DIAGNOSIS — T8743 Infection of amputation stump, right lower extremity: Secondary | ICD-10-CM

## 2014-12-10 DIAGNOSIS — Z794 Long term (current) use of insulin: Secondary | ICD-10-CM

## 2014-12-10 DIAGNOSIS — E1165 Type 2 diabetes mellitus with hyperglycemia: Secondary | ICD-10-CM

## 2014-12-10 DIAGNOSIS — E1122 Type 2 diabetes mellitus with diabetic chronic kidney disease: Secondary | ICD-10-CM

## 2014-12-10 LAB — GLUCOSE, CAPILLARY
GLUCOSE-CAPILLARY: 243 mg/dL — AB (ref 70–99)
GLUCOSE-CAPILLARY: 170 mg/dL — AB (ref 70–99)
Glucose-Capillary: 173 mg/dL — ABNORMAL HIGH (ref 70–99)
Glucose-Capillary: 247 mg/dL — ABNORMAL HIGH (ref 70–99)

## 2014-12-10 MED ORDER — INSULIN ASPART 100 UNIT/ML ~~LOC~~ SOLN
13.0000 [IU] | Freq: Three times a day (TID) | SUBCUTANEOUS | Status: DC
  Administered 2014-12-10 – 2014-12-14 (×9): 13 [IU] via SUBCUTANEOUS

## 2014-12-10 NOTE — Progress Notes (Signed)
Subjective:  Patient states that her pain is stable from yesterday. Patient states that she would prefer not to go to a rehabilitation facility after she is done with her possible procedure tomorrow, although she stated that she would be willing to think about it more. Otherwise, patient not reporting any complaints.  Objective: Vital signs in last 24 hours: Filed Vitals:   12/09/14 2100 12/10/14 0456 12/10/14 0900 12/10/14 0929  BP: 146/69 153/73  128/64  Pulse: 77 80  75  Temp: 99.6 F (37.6 C) 97.8 F (36.6 C)  98.4 F (36.9 C)  TempSrc: Oral Oral  Oral  Resp: 20 18  17   Height:      Weight: 239 lb (108.41 kg)     SpO2: 90% 98% 96% 92%   Weight change: -4 lb 6.4 oz (-1.996 kg)  Intake/Output Summary (Last 24 hours) at 12/10/14 1032 Last data filed at 12/10/14 0900  Gross per 24 hour  Intake   1257 ml  Output    625 ml  Net    632 ml    Constitutional: She is oriented to person, place, and time and well-developed, well-nourished, and in no distress. No distress.  Obese.  HENT:  Crusted lesion on left upper lip associated with swelling of inner left cheek soft tissues, stable. Eyes: Conjunctivae and EOM are normal. Pupils are equal, round, and reactive to light. Bilateral eyes without discharge. No scleral icterus.  Neck: Normal range of motion. Neck supple.  Cardiovascular: Normal rate and regular rhythm. No murmurs, rubs or gallops Pulmonary/Chest: Effort normal and breath sounds normal. No respiratory distress. She has no wheezes.  Abdominal: Soft. Bowel sounds are normal. She exhibits no distension. There is no tenderness.  Musculoskeletal: Normal range of motion. She exhibits tenderness (right BKA stump). She exhibits no edema.  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  Neurological: She is alert and oriented to person, place, and time. No cranial nerve deficit. She exhibits normal muscle tone.  Skin: Skin is warm. She is not  diaphoretic. There is erythema.  Erythema surrounding right BKA stump. Ulcer on plantar aspect of left foot associated with swelling.   Lab Results: Basic Metabolic Panel:  Recent Labs Lab 12/08/14 0700 12/09/14 0503  NA 133* 136  K 4.2 4.2  CL 98 100  CO2 26 26  GLUCOSE 455* 276*  BUN 31* 19  CREATININE 1.39* 0.99  CALCIUM 8.3* 9.1   Liver Function Tests:  Recent Labs Lab 12/07/14 1809  AST 20  ALT 18  ALKPHOS 121*  BILITOT 0.5  PROT 6.0  ALBUMIN 2.3*   No results for input(s): LIPASE, AMYLASE in the last 168 hours. No results for input(s): AMMONIA in the last 168 hours. CBC:  Recent Labs Lab 12/07/14 1809 12/08/14 0700 12/09/14 0503  WBC 10.5 9.3 7.6  NEUTROABS 7.7  --   --   HGB 11.4* 10.5* 10.4*  HCT 34.2* 31.9* 32.0*  MCV 84.7 86.0 84.4  PLT 151 151 152   Cardiac Enzymes: No results for input(s): CKTOTAL, CKMB, CKMBINDEX, TROPONINI in the last 168 hours. BNP: No results for input(s): PROBNP in the last 168 hours. D-Dimer: No results for input(s): DDIMER in the last 168 hours. CBG:  Recent Labs Lab 12/08/14 2231 12/09/14 0729 12/09/14 1125 12/09/14 1559 12/09/14 2113 12/10/14 0733  GLUCAP 314* 290* 370* 331* 247* 173*   Hemoglobin A1C:  Recent Labs Lab 12/08/14 1800  HGBA1C 10.9*   Fasting Lipid Panel: No  results for input(s): CHOL, HDL, LDLCALC, TRIG, CHOLHDL, LDLDIRECT in the last 168 hours. Thyroid Function Tests: No results for input(s): TSH, T4TOTAL, FREET4, T3FREE, THYROIDAB in the last 168 hours. Coagulation: No results for input(s): LABPROT, INR in the last 168 hours. Anemia Panel: No results for input(s): VITAMINB12, FOLATE, FERRITIN, TIBC, IRON, RETICCTPCT in the last 168 hours. Urine Drug Screen: Drugs of Abuse     Component Value Date/Time   LABOPIA NONE DETECTED 04/21/2013 1531   COCAINSCRNUR NONE DETECTED 04/21/2013 1531   LABBENZ NONE DETECTED 04/21/2013 1531   AMPHETMU NONE DETECTED 04/21/2013 1531   THCU NONE  DETECTED 04/21/2013 1531   LABBARB NONE DETECTED 04/21/2013 1531    Alcohol Level: No results for input(s): ETH in the last 168 hours. Urinalysis:  Recent Labs Lab 12/07/14 2125  COLORURINE YELLOW  LABSPEC 1.022  PHURINE 5.0  GLUCOSEU >1000*  HGBUR SMALL*  BILIRUBINUR NEGATIVE  KETONESUR NEGATIVE  PROTEINUR 100*  UROBILINOGEN 0.2  NITRITE POSITIVE*  LEUKOCYTESUR NEGATIVE    Micro Results: Recent Results (from the past 240 hour(s))  Blood culture (routine x 2)     Status: None (Preliminary result)   Collection Time: 12/07/14  8:49 PM  Result Value Ref Range Status   Specimen Description BLOOD LEFT WRIST  Final   Special Requests BOTTLES DRAWN AEROBIC AND ANAEROBIC 3CC EA  Final   Culture   Final           BLOOD CULTURE RECEIVED NO GROWTH TO DATE CULTURE WILL BE HELD FOR 5 DAYS BEFORE ISSUING A FINAL NEGATIVE REPORT Performed at [facility]    Report Status PENDING  Incomplete  Blood culture (routine x 2)     Status: None (Preliminary result)   Collection Time: 12/07/14  8:50 PM  Result Value Ref Range Status   Specimen Description BLOOD RIGHT ANTECUBITAL  Final   Special Requests BOTTLES DRAWN AEROBIC AND ANAEROBIC 5CC EA  Final   Culture   Final           BLOOD CULTURE RECEIVED NO GROWTH TO DATE CULTURE WILL BE HELD FOR 5 DAYS BEFORE ISSUING A FINAL NEGATIVE REPORT Performed at [facility]    Report Status PENDING  Incomplete   Studies/Results: [place] Extrem Low Left Ltd  12/08/2014   CLINICAL DATA:  Left foot ulcer.  EXAM: ULTRASOUND left LOWER EXTREMITY LIMITED  TECHNIQUE: Ultrasound examination of the lower extremity soft tissues was performed in the area of clinical concern.  COMPARISON:  October 08, 2014.  FINDINGS: Sonographic evaluation of the medial aspect of the left foot along plantar surface demonstrates no fluid collection or abscess.  IMPRESSION: No definite abscess or fluid collection seen in the area of concern.   Electronically Signed    By: Lupita Raider, M.D.   On: 12/08/2014 15:54   [place] Extrem Low Right Ltd  12/10/2014   CLINICAL DATA:  Abscess of amputation stump.  EXAM: ULTRASOUND RIGHT LOWER EXTREMITY LIMITED  TECHNIQUE: Ultrasound examination of the lower extremity soft tissues was performed in the area of clinical concern.  COMPARISON:  Ultrasound 12/08/2014.  FINDINGS: Diffuse edema is present in the subcutaneous tissues of the RIGHT below-the-knee amputation stump. There is an area of focal phlegmon in the subcutaneous fat anteriorly that measures 25 mm x 7 mm x 26 mm. This has internal echo does without definite increased through transmission. The borders are irregular and this is more compatible with phlegmon than abscess.  IMPRESSION: No abscess identified. 25 mm x  7 mm x 26 mm focal area of phlegmon in the soft tissues.   Electronically Signed   By: Andreas Newport M.D.   On: 12/10/2014 10:12   Medications: I have reviewed the patient's current medications. Scheduled Meds: . amLODipine  5 mg Oral Daily  . antiseptic oral rinse  7 mL Mouth Rinse BID  . aspirin EC  81 mg Oral Daily  . atorvastatin  80 mg Oral Daily  . clindamycin (CLEOCIN) IV  600 mg Intravenous 3 times per day  . feeding supplement (GLUCERNA SHAKE)  237 mL Oral Q1500  . feeding supplement (PRO-STAT SUGAR FREE 64)  30 mL Oral BID  . FLUoxetine  60 mg Oral Daily  . gabapentin  300 mg Oral TID  . heparin  5,000 Units Subcutaneous 3 times per day  . insulin aspart  0-15 Units Subcutaneous TID WC  . insulin aspart  0-5 Units Subcutaneous QHS  . insulin aspart  10 Units Subcutaneous TID WC  . insulin detemir  75 Units Subcutaneous Daily  . levothyroxine  25 mcg Oral QAC breakfast  . metoCLOPramide  10 mg Oral TID AC & HS  . mometasone-formoterol  2 puff Inhalation BID  . oxybutynin  5 mg Oral Q breakfast  . pantoprazole  40 mg Oral Daily  . sodium chloride  3 mL Intravenous Q12H  . tiotropium  18 mcg Inhalation Daily   Continuous Infusions:  PRN  Meds:.HYDROcodone-acetaminophen Assessment/Plan: Principal Problem:   Cellulitis Active Problems:   Diabetes mellitus type 2, uncontrolled, with complications   TOBACCO ABUSE   HTN (hypertension)   Anemia of chronic disease   Hypothyroidism   Acute on chronic kidney failure   OSA on CPAP with oxygen   Diabetic foot ulcer   #Right BKA stump cellulitis Orthopedic surgery has evaluated the patient and is planning for an irrigation and debridement of an abscess at the right BKA stump. This is planned for Friday unless the infection resolves on its own. Ultrasound showing a 25 mm x 7 mm x 26 mm of phlegmon with no definite abscess. -Appreciate orthopedics consultation -Continue clindamycin IV -Vicodin 5-325 mg every 6 hours when necessary. -PT and OT recommending SNF  #Type 2 diabetes with peripheral neuropathy and diabetic retinopathy: CBGs 240s to 370s over the last 24 hours. A1c from this admission of 10.9. Over the last 24 hours, she has received 75 units long acting and a sum of 66 units short acting. She takes Levemir 200 units daily at home along with NovoLog sliding scale 8-25 units daily. -resistance sliding scale insulin -Continue Levemir at 75 units -Continue mealtime and nighttime coverage (increase mealtime coverage to 13 units 3 times a day) -Continue home gabapentin 300 mg 3 times a day. -Continue home metoclopramide 10 mg 4 times daily. -Continue home oxybutynin 5 mg daily.  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 tach on telemetry Follows with Dr. Graciela Husbands of cardiology, pacemaker placed in July 2015 for sinus node dysfunction. Patient noted to intermittently be ventricular tachycardia on telemetry while in the emergency room. -Interrogation of device not found in chart. Will recheck with representative.  -Continue to monitor on telemetry  #CKD:  AKI has resolved.   #COPD No acute issues. -Continue home Advair [facility]) twice a day. -Continue home  Spiriva daily. -Supple without oxygen with goal saturation of 92%.  #Diastolic congestive heart failure Echocardiogram from July 2015 with grade 2 diastolic dysfunction, ejection fraction previously normal. -Continue home Lasix 40 mg daily.  #  Hypothyroidism -Continue home Synthroid 25 g daily.  #Hypertension Patient with mildly elevated blood pressures 146-153/60s. Likely exacerbated by pain currently from cellulitis.  -Continue home amlodipine 5 mg daily. -Holding home lisinopril in the setting recent AKI. May consider resuming given stabilization.  #Hyperlipidemia -Continue home atorvastatin 80 mg daily.  #GERD -Continue home PPI.  #Rheumatoid arthritis On ibuprofen at home. -Pain control as above.  #Depression/anxiety -Continue home Prozac 60 mg daily.  #DVT prophylaxis -Heparin.  Dispo: Disposition is deferred at this time, pending possible surgery on Friday. -Follow up with wound care clinic -PT and OT recommending SNF  The patient does have a current PCP [name], MD), therefore will not be require OPC follow-up after discharge.   The patient does have transportation limitations that hinder transportation to clinic appointments.   LOS: 2 days   Services Needed at time of discharge: Y = Yes, Blank = No PT:   OT:   RN:   Equipment:   Other:    Harold Barban, MD 12/10/2014, 10:32 AM

## 2014-12-10 NOTE — Progress Notes (Signed)
Inpatient Diabetes Program Recommendations  AACE/ADA: New Consensus Statement on Inpatient Glycemic Control (2013)  Target Ranges:  Prepandial:   less than 140 mg/dL      Peak postprandial:   less than 180 mg/dL (1-2 hours)      Critically ill patients:  140 - 180 mg/dL    Results for ALEXSIA, KLINDT (MRN [id number]) as of 12/10/2014 07:37  Ref. Range 12/09/2014 07:29 12/09/2014 11:25 12/09/2014 15:59 12/09/2014 21:13  Glucose-Capillary Latest Ref Range: 70-99 mg/dL 606 (H) 004 (H) 599 (H) 247 (H)     Admit with: Swelling R LE Stump  History: DM, PVD, HTN, CHF, COPD  Home DM Meds: Levemir 200 units daily  Novolog 8-25 units tid per SSI  Current DM Orders: Levemir 75 units daily  Novolog Moderate SSI  Novolog 10 units tidwc    **Note Levemir dose increased to 75 units daily yesterday.  **Also note 10 units Novolog Meal Coverage added yesterday AM.  **DM Coordinator spoke with patient yesterday about importance of home glucose control (see DM Coordinator note from 04/20 for details of conversation).     MD- Please consider the following in-hospital insulin adjustments:  1. Increase Levemir to 100 units daily  2. Increase Novolog Meal Coverage to 12 units tidwc     Will follow Ambrose Finland RN, MSN, CDE Diabetes Coordinator Inpatient Diabetes Program Team Pager: [phone] (8a-5p)

## 2014-12-10 NOTE — Progress Notes (Signed)
Pt dressing applied to left foot today using Silver (at bedside). Pt up in chair most of the day. Consent for surgery is in pt shadow chart. Cecille Rubin, RN

## 2014-12-10 NOTE — Progress Notes (Signed)
Pt. Refused cpap.Pt. States she does not wear cpap anymore.

## 2014-12-10 NOTE — Progress Notes (Signed)
PT Cancellation Note  Patient Details Name: [name] MRN: [id number] DOB: [birth year]   Cancelled Treatment:    Reason Eval/Treat Not Completed: Patient declined, no reason specified. Attempted to see patient today for PT.  Patient in chair.  States she wants to stay in chair for now, and is having surgery tomorrow.  Patient declined PT today.  MD:  Please write post-op PT orders when appropriate for PT to resume services.  Thank you.   Vena Austria 12/10/2014, 7:50 PM Durenda Hurt. Renaldo Fiddler, [facility] Pager [phone]

## 2014-12-10 NOTE — Progress Notes (Signed)
Subjective:   Procedure(s) (LRB): Right Below Knee Amputation Revision (Right) Patient reports pain as 4 on 0-10 scale.    Objective: Vital signs in last 24 hours: Temp:  [97.8 F (36.6 C)-99.6 F (37.6 C)] 97.8 F (36.6 C) (04/21 0456) Pulse Rate:  [77-87] 80 (04/21 0456) Resp:  [18-20] 18 (04/21 0456) BP: (146-154)/(52-73) 153/73 mmHg (04/21 0456) SpO2:  [90 %-98 %] 98 % (04/21 0456) Weight:  [108.41 kg (239 lb)] 108.41 kg (239 lb) (04/20 2100)  Intake/Output from previous day: 04/20 0701 - 04/21 0700 In: 1017 [P.O.:917; IV Piggyback:100] Out: 625 [Urine:625] Intake/Output this shift: Total I/O In: 240 [P.O.:240] Out: -    Recent Labs  12/07/14 1809 12/08/14 0700 12/09/14 0503  HGB 11.4* 10.5* 10.4*    Recent Labs  12/08/14 0700 12/09/14 0503  WBC 9.3 7.6  RBC 3.71* 3.79*  HCT 31.9* 32.0*  PLT 151 152    Recent Labs  12/08/14 0700 12/09/14 0503  NA 133* 136  K 4.2 4.2  CL 98 100  CO2 26 26  BUN 31* 19  CREATININE 1.39* 0.99  GLUCOSE 455* 276*  CALCIUM 8.3* 9.1   No results for input(s): LABPT, INR in the last 72 hours.  Cellulitic area around right BKA stump is unchanged from Tues-feels more fluctuant today and still painful  Assessment/Plan:   Procedure(s) (LRB): Right Below Knee Amputation Revision (Right) Agree with Dr Lajoyce Corners that formal I&D is worthwhile if no further change by tomorrow-will sign off and Dr Lajoyce Corners will assume ortho F/U  [facility] 12/10/2014, 8:47 AM

## 2014-12-10 NOTE — Progress Notes (Signed)
Pt states her mom is wanting her to go to an ALF, but pt states she is not ready to commit to an ALF long term. Pt does admit she can not see her medications and her mail. She states she was going to have eye surgery done next Friday, but if leg revision is not healed she will not be able to get prosthesis on, therefore she will have to cancel her appt. Pt asks if she could have an aid to come help her at home. Will pass this on to case management. Cecille Rubin, RN

## 2014-12-10 NOTE — Progress Notes (Signed)
  PROGRESS NOTE MEDICINE TEACHING ATTENDING   Day 2 of stay Patient name: [name]   Medical record number: [id number] Date of birth: June 17, 1961   She offers no new complaints. She reports less pain on the lip and cheek area.   Blood pressure 128/64, pulse 75, temperature 98.4 F (36.9 C), temperature source Oral, resp. rate 17, height 5\' 5"  (1.651 m), weight 239 lb (108.41 kg), SpO2 92 %. Her stump wound looks worse today with a pocket of purulent material visible which feels fluctuant and tender. Her lip cellulitis is improving.  USG stump shows a focal phlegmon. Per Dr notes, a below knee amputation revision will be performed tomorrow by Dr Hoy Register. We continue clinda IV for now.   I have discussed the care of this patient with my IM team residents. Please see the resident note for details.  Lajoyce Corners 12/10/2014, 3:56 PM.

## 2014-12-10 NOTE — Clinical Documentation Improvement (Signed)
Md's, NP's, and PA's  Documentation of " Acute on Chronic Kidney Disease" please provide the stage if appropriate for this admission.   Thank you   Possible Clinical Conditions?   CKD Stage I - GFR > OR = 90 CKD Stage II - GFR 60-80 CKD Stage III - GFR 30-59 CKD Stage IV - GFR 15-29 CKD Stage V - GFR < 15 Other condition Cannot Clinically determine      Signs and Symptoms:  GFR 64, 42, 34 since admisision  Diagnostics BMET daily  Treatment: reduced IV fluids   Thank You, Lavonda Jumbo ,RN Clinical Documentation Specialist:  [phone]  [facility]- Health Information Management

## 2014-12-11 ENCOUNTER — Encounter (HOSPITAL_COMMUNITY): Payer: Self-pay | Admitting: Certified Registered"

## 2014-12-11 ENCOUNTER — Inpatient Hospital Stay (HOSPITAL_COMMUNITY): Payer: Medicare HMO | Admitting: Certified Registered"

## 2014-12-11 ENCOUNTER — Encounter (HOSPITAL_COMMUNITY)
Admission: EM | Disposition: A | Discharge status: Home / Self Care | Payer: Self-pay | Source: Home / Self Care | Attending: Internal Medicine

## 2014-12-11 HISTORY — PX: STUMP REVISION: SHX6102

## 2014-12-11 LAB — CBC
HEMATOCRIT: 32.2 % — AB (ref 36.0–46.0)
Hemoglobin: 10.5 g/dL — ABNORMAL LOW (ref 12.0–15.0)
MCH: 27.6 pg (ref 26.0–34.0)
MCHC: 32.6 g/dL (ref 30.0–36.0)
MCV: 84.7 fL (ref 78.0–100.0)
Platelets: 201 10*3/uL (ref 150–400)
RBC: 3.8 MIL/uL — ABNORMAL LOW (ref 3.87–5.11)
RDW: 13.9 % (ref 11.5–15.5)
WBC: 8.4 10*3/uL (ref 4.0–10.5)

## 2014-12-11 LAB — GLUCOSE, CAPILLARY
GLUCOSE-CAPILLARY: 158 mg/dL — AB (ref 70–99)
Glucose-Capillary: 250 mg/dL — ABNORMAL HIGH (ref 70–99)
Glucose-Capillary: 204 mg/dL — ABNORMAL HIGH (ref 70–99)
Glucose-Capillary: 195 mg/dL — ABNORMAL HIGH (ref 70–99)
Glucose-Capillary: 163 mg/dL — ABNORMAL HIGH (ref 70–99)
Glucose-Capillary: 322 mg/dL — ABNORMAL HIGH (ref 70–99)

## 2014-12-11 LAB — SURGICAL PCR SCREEN
MRSA, PCR: POSITIVE — AB
Staphylococcus aureus: POSITIVE — AB

## 2014-12-11 LAB — BASIC METABOLIC PANEL
Anion gap: 8 (ref 5–15)
BUN: 24 mg/dL — AB (ref 6–23)
CHLORIDE: 96 mmol/L (ref 96–112)
CO2: 31 mmol/L (ref 19–32)
CREATININE: 1.09 mg/dL (ref 0.50–1.10)
Calcium: 8.9 mg/dL (ref 8.4–10.5)
GFR calc non Af Amer: 57 mL/min — ABNORMAL LOW (ref >90)
GFR, EST AFRICAN AMERICAN: 66 mL/min — AB (ref >90)
GLUCOSE: 246 mg/dL — AB (ref 70–99)
Potassium: 4.6 mmol/L (ref 3.5–5.1)
Sodium: 135 mmol/L (ref 135–145)

## 2014-12-11 SURGERY — REVISION, AMPUTATION SITE
Anesthesia: Monitor Anesthesia Care | Site: Leg Lower | Laterality: Right

## 2014-12-11 MED ORDER — METHOCARBAMOL 500 MG PO TABS: 500.0000 mg | ORAL_TABLET | Freq: Four times a day (QID) | ORAL | Status: DC | PRN

## 2014-12-11 MED ORDER — FENTANYL CITRATE (PF) 250 MCG/5ML IJ SOLN
INTRAMUSCULAR | Status: AC
  Filled 2014-12-11: qty 5

## 2014-12-11 MED ORDER — OXYCODONE HCL 5 MG/5ML PO SOLN: 5.0000 mg | Freq: Once | ORAL | Status: AC | PRN

## 2014-12-11 MED ORDER — MIDAZOLAM HCL 2 MG/2ML IJ SOLN
INTRAMUSCULAR | Status: AC
  Filled 2014-12-11: qty 2

## 2014-12-11 MED ORDER — FENTANYL CITRATE (PF) 100 MCG/2ML IJ SOLN: 25.0000 ug | INTRAMUSCULAR | Status: DC | PRN

## 2014-12-11 MED ORDER — OXYCODONE HCL 5 MG PO TABS
ORAL_TABLET | ORAL | Status: AC
  Filled 2014-12-11: qty 1

## 2014-12-11 MED ORDER — ACETAMINOPHEN 325 MG PO TABS: 650.0000 mg | ORAL_TABLET | Freq: Four times a day (QID) | ORAL | Status: DC | PRN

## 2014-12-11 MED ORDER — HYDROMORPHONE HCL 1 MG/ML IJ SOLN
1.0000 mg | INTRAMUSCULAR | Status: DC | PRN
  Administered 2014-12-11 – 2014-12-12 (×5): 1 mg via INTRAVENOUS
  Filled 2014-12-11 (×5): qty 1

## 2014-12-11 MED ORDER — ONDANSETRON HCL 4 MG/2ML IJ SOLN: 4.0000 mg | Freq: Once | INTRAMUSCULAR | Status: DC | PRN

## 2014-12-11 MED ORDER — HYDROMORPHONE HCL 1 MG/ML IJ SOLN
INTRAMUSCULAR | Status: AC
  Filled 2014-12-11: qty 1

## 2014-12-11 MED ORDER — CHLORHEXIDINE GLUCONATE CLOTH 2 % EX PADS
6.0000 | MEDICATED_PAD | Freq: Every day | CUTANEOUS | Status: DC
  Administered 2014-12-11 – 2014-12-14 (×4): 6 via TOPICAL

## 2014-12-11 MED ORDER — FENTANYL CITRATE (PF) 100 MCG/2ML IJ SOLN
INTRAMUSCULAR | Status: DC | PRN
  Administered 2014-12-11: 50 ug via INTRAVENOUS

## 2014-12-11 MED ORDER — MIDAZOLAM HCL 5 MG/5ML IJ SOLN
INTRAMUSCULAR | Status: DC | PRN
  Administered 2014-12-11: 2 mg via INTRAVENOUS

## 2014-12-11 MED ORDER — GENTAMICIN SULFATE 40 MG/ML IJ SOLN
INTRAMUSCULAR | Status: DC | PRN
  Administered 2014-12-11: 240 mg

## 2014-12-11 MED ORDER — PROPOFOL 10 MG/ML IV BOLUS
INTRAVENOUS | Status: AC
  Filled 2014-12-11: qty 20

## 2014-12-11 MED ORDER — SODIUM CHLORIDE 0.9 % IV SOLN
INTRAVENOUS | Status: DC
  Administered 2014-12-11: 10 mL/h via INTRAVENOUS

## 2014-12-11 MED ORDER — PROPOFOL 10 MG/ML IV BOLUS
INTRAVENOUS | Status: DC | PRN
  Administered 2014-12-11: 20 mg via INTRAVENOUS

## 2014-12-11 MED ORDER — LACTATED RINGERS IV SOLN
Freq: Once | INTRAVENOUS | Status: AC
  Administered 2014-12-11: 13:00:00 via INTRAVENOUS

## 2014-12-11 MED ORDER — METHOCARBAMOL 1000 MG/10ML IJ SOLN
500.0000 mg | Freq: Four times a day (QID) | INTRAVENOUS | Status: DC | PRN
  Filled 2014-12-11: qty 5

## 2014-12-11 MED ORDER — VANCOMYCIN HCL 1000 MG IV SOLR
INTRAVENOUS | Status: AC
  Filled 2014-12-11: qty 1000

## 2014-12-11 MED ORDER — ACETAMINOPHEN 650 MG RE SUPP: 650.0000 mg | Freq: Four times a day (QID) | RECTAL | Status: DC | PRN

## 2014-12-11 MED ORDER — 0.9 % SODIUM CHLORIDE (POUR BTL) OPTIME
TOPICAL | Status: DC | PRN
  Administered 2014-12-11: 1000 mL

## 2014-12-11 MED ORDER — GENTAMICIN SULFATE 40 MG/ML IJ SOLN
INTRAMUSCULAR | Status: AC
  Filled 2014-12-11: qty 6

## 2014-12-11 MED ORDER — MUPIROCIN 2 % EX OINT
1.0000 "application " | TOPICAL_OINTMENT | Freq: Two times a day (BID) | CUTANEOUS | Status: DC
  Administered 2014-12-11 – 2014-12-14 (×7): 1 via NASAL
  Filled 2014-12-11: qty 22

## 2014-12-11 MED ORDER — OXYCODONE HCL 5 MG PO TABS
5.0000 mg | ORAL_TABLET | Freq: Once | ORAL | Status: AC | PRN
  Administered 2014-12-11: 5 mg via ORAL

## 2014-12-11 MED ORDER — VANCOMYCIN HCL 1000 MG IV SOLR
INTRAVENOUS | Status: DC | PRN
  Administered 2014-12-11: 1000 mg

## 2014-12-11 MED ORDER — LACTATED RINGERS IV SOLN
INTRAVENOUS | Status: DC | PRN
  Administered 2014-12-11: 14:00:00 via INTRAVENOUS

## 2014-12-11 MED ORDER — OXYCODONE HCL 5 MG PO TABS
5.0000 mg | ORAL_TABLET | ORAL | Status: DC | PRN
  Administered 2014-12-12 – 2014-12-13 (×2): 5 mg via ORAL
  Filled 2014-12-11 (×2): qty 1

## 2014-12-11 SURGICAL SUPPLY — 65 items
BANDAGE ESMARK 6X9 LF (GAUZE/BANDAGES/DRESSINGS) IMPLANT
BLADE SAW RECIP 87.9 MT (BLADE) ×3 IMPLANT
BLADE SURG 10 STRL SS (BLADE) ×3 IMPLANT
BNDG COHESIVE 6X5 TAN STRL LF (GAUZE/BANDAGES/DRESSINGS) ×3 IMPLANT
BNDG ESMARK 6X9 LF (GAUZE/BANDAGES/DRESSINGS)
BNDG GAUZE ELAST 4 BULKY (GAUZE/BANDAGES/DRESSINGS) ×3 IMPLANT
BNDG GAUZE STRTCH 6 (GAUZE/BANDAGES/DRESSINGS) IMPLANT
COVER SURGICAL LIGHT HANDLE (MISCELLANEOUS) ×6 IMPLANT
CUFF TOURNIQUET SINGLE 34IN LL (TOURNIQUET CUFF) IMPLANT
DRAIN PENROSE 1/2X12 LTX STRL (WOUND CARE) ×3 IMPLANT
DRAPE EXTREMITY T 121X128X90 (DRAPE) ×3 IMPLANT
DRAPE IMP U-DRAPE 54X76 (DRAPES) ×3 IMPLANT
DRAPE PROXIMA HALF (DRAPES) ×6 IMPLANT
DRAPE U-SHAPE 47X51 STRL (DRAPES) ×3 IMPLANT
DRSG ADAPTIC 3X8 NADH LF (GAUZE/BANDAGES/DRESSINGS) ×3 IMPLANT
DURAPREP 26ML APPLICATOR (WOUND CARE) ×3 IMPLANT
ELECT CAUTERY BLADE 6.4 (BLADE) ×3 IMPLANT
ELECT REM PT RETURN 9FT ADLT (ELECTROSURGICAL) ×3
ELECTRODE REM PT RTRN 9FT ADLT (ELECTROSURGICAL) ×1 IMPLANT
EVACUATOR 1/8 PVC DRAIN (DRAIN) IMPLANT
GAUZE SPONGE 4X4 12PLY STRL (GAUZE/BANDAGES/DRESSINGS) ×3 IMPLANT
GLOVE BIO SURGEON STRL SZ 6.5 (GLOVE) ×2 IMPLANT
GLOVE BIO SURGEONS STRL SZ 6.5 (GLOVE) ×1
GLOVE BIOGEL PI IND STRL 6.5 (GLOVE) ×1 IMPLANT
GLOVE BIOGEL PI IND STRL 7.0 (GLOVE) ×1 IMPLANT
GLOVE BIOGEL PI IND STRL 7.5 (GLOVE) ×1 IMPLANT
GLOVE BIOGEL PI IND STRL 9 (GLOVE) ×1 IMPLANT
GLOVE BIOGEL PI INDICATOR 6.5 (GLOVE) ×2
GLOVE BIOGEL PI INDICATOR 7.0 (GLOVE) ×2
GLOVE BIOGEL PI INDICATOR 7.5 (GLOVE) ×2
GLOVE BIOGEL PI INDICATOR 9 (GLOVE) ×2
GLOVE ECLIPSE 6.5 STRL STRAW (GLOVE) ×3 IMPLANT
GLOVE SURG ORTHO 9.0 STRL STRW (GLOVE) ×3 IMPLANT
GOWN STRL REUS W/ TWL LRG LVL3 (GOWN DISPOSABLE) ×3 IMPLANT
GOWN STRL REUS W/ TWL XL LVL3 (GOWN DISPOSABLE) ×1 IMPLANT
GOWN STRL REUS W/TWL LRG LVL3 (GOWN DISPOSABLE) ×6
GOWN STRL REUS W/TWL XL LVL3 (GOWN DISPOSABLE) ×2
KIT BASIN OR (CUSTOM PROCEDURE TRAY) ×3 IMPLANT
KIT ROOM TURNOVER OR (KITS) ×3 IMPLANT
KIT STIMULAN RAPID CURE  10CC (Orthopedic Implant) ×2 IMPLANT
KIT STIMULAN RAPID CURE 10CC (Orthopedic Implant) ×1 IMPLANT
MANIFOLD NEPTUNE II (INSTRUMENTS) IMPLANT
NEEDLE 18GX1X1/2 (RX/OR ONLY) (NEEDLE) ×3 IMPLANT
NS IRRIG 1000ML POUR BTL (IV SOLUTION) ×3 IMPLANT
PACK GENERAL/GYN (CUSTOM PROCEDURE TRAY) ×3 IMPLANT
PAD ABD 8X10 STRL (GAUZE/BANDAGES/DRESSINGS) ×6 IMPLANT
PAD ARMBOARD 7.5X6 YLW CONV (MISCELLANEOUS) ×6 IMPLANT
PAD CAST 4YDX4 CTTN HI CHSV (CAST SUPPLIES) IMPLANT
PADDING CAST COTTON 4X4 STRL (CAST SUPPLIES)
PADDING CAST COTTON 6X4 STRL (CAST SUPPLIES) IMPLANT
SPONGE LAP 18X18 X RAY DECT (DISPOSABLE) IMPLANT
STAPLER VISISTAT 35W (STAPLE) IMPLANT
STOCKINETTE IMPERVIOUS LG (DRAPES) IMPLANT
SUT ETHILON 2 0 PSLX (SUTURE) ×6 IMPLANT
SUT PDS AB 1 CT  36 (SUTURE)
SUT PDS AB 1 CT 36 (SUTURE) IMPLANT
SUT SILK 2 0 (SUTURE) ×2
SUT SILK 2-0 18XBRD TIE 12 (SUTURE) ×1 IMPLANT
SUT VIC AB 1 CTX 36 (SUTURE)
SUT VIC AB 1 CTX36XBRD ANBCTR (SUTURE) IMPLANT
SYRINGE 10CC LL (SYRINGE) ×3 IMPLANT
TOWEL OR 17X24 6PK STRL BLUE (TOWEL DISPOSABLE) ×3 IMPLANT
TOWEL OR 17X26 10 PK STRL BLUE (TOWEL DISPOSABLE) ×3 IMPLANT
TUBE ANAEROBIC SPECIMEN COL (MISCELLANEOUS) IMPLANT
WATER STERILE IRR 1000ML POUR (IV SOLUTION) IMPLANT

## 2014-12-11 NOTE — H&P (View-Only) (Signed)
Reason for Consult: Abscess cellulitis right transtibial amputation Referring Physician: Hospitalist  [name] is an 54 y.o. female.  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  Past Medical History  Diagnosis Date  . Diabetic neuropathy   . Cellulitis   . Thyroid disease   . Hyperlipidemia   . Neuromuscular disorder   . Concussion as a teenager    slight  . Diarrhea     pt takes Reglan tid  . Hypothyroidism     takes Synthroid daily  . Depression     takes CYmbalta daily  . MRSA (methicillin resistant staph aureus) culture positive     hx of 2012  . Staph infection 2007  . Complication of anesthesia 01/2013    "didn't know where I was; who I was; talking out the top of my head" (05/01/13)  . Hypertension   . Peripheral vascular disease   . CHF (congestive heart failure)   . Pneumonia 1980's    "hospitalized w/it" (01-May-2013)  . Chronic bronchitis   . Orthopnea     "just one; yesterday morning" (2013-05-01)  . Type II diabetes mellitus     Novolog,Levemir,and Lovaza daily  . GERD (gastroesophageal reflux disease)   . Daily headache   . Rheumatoid arthritis     "both knees" (05-01-2013)  . Anxiety   . Kidney stone     "passed on before" (05-01-13)  . Non-healing wound of lower extremity     lt foot  . OSA on CPAP     and oxygen  . COPD (chronic obstructive pulmonary disease)     wear oxygen at home  . Symptomatic bradycardia 03/04/2014    Past Surgical History  Procedure Laterality Date  . Amputation    . Leg amputation below knee Right 2012  . Toe amputation Left ?2011     only 2 toes remaning.   . Tubal ligation  1990's  . Foot surgery Right 2012    "put pins in one year; amputated toes another OR" (May 01, 2013)  . Wrist surgery Right 1980's    "pinched nerve" (May 01, 2013)   . Dilation and curettage of uterus  1980's  . Refractive surgery Bilateral 2014  . Incision and drainage abscess N/A 01/24/2013    Procedure: INCISION AND DRAINAGE ABSCESS;  Surgeon: Joyice Faster. Cornett, MD;  Location: [place];  Service: General;  Laterality: N/A;  . Inguinal hernia repair N/A 01/28/2013    Procedure: I&D Right Groin Wound;  Surgeon: Harl Bowie, MD;  Location: [place];  Service: General;  Laterality: N/A;  . Ganglion cyst excision Left 1990's    "wrist" (May 01, 2013)  . Bladder surgery  1970's    "stretched the mouth of my bladder" (2013/05/01)  . I&d extremity Left 08/25/2013    Procedure: IRRIGATION AND DEBRIDEMENT EXTREMITY;  Surgeon: Ralene Ok, MD;  Location: [place];  Service: General;  Laterality: Left;  . Pacemaker insertion  03/05/14    Biotronik dual chamber pacemaker implanted by Dr Caryl Comes for symptomatic bradycardia  . Permanent pacemaker insertion N/A 03/05/2014    Procedure: PERMANENT PACEMAKER INSERTION;  Surgeon: Deboraha Sprang, MD;  Location: [facility] CATH [facility];  Service: Cardiovascular;  Laterality: N/A;    Family History  Problem Relation Age of Onset  . Hypertension Mother   . Hyperlipidemia Mother   . Hypertrophic cardiomyopathy  Mother     has pacer  . Hypertension Brother   . Hyperlipidemia Brother   . Cancer Maternal Aunt   . Diabetes Paternal Aunt   . Diabetes Paternal Uncle   . Diabetes Paternal Grandmother   . Anesthesia problems Neg Hx   . Hypotension Neg Hx   . Malignant hyperthermia Neg Hx   . Pseudochol deficiency Neg Hx   . Alzheimer's disease Father     Social History:  reports that she has been smoking Cigarettes.  She has a 33 pack-year smoking history. She has never used smokeless tobacco. She reports that she does not drink alcohol or use illicit drugs.  Allergies:  Allergies  Allergen Reactions  . Erythromycin Diarrhea  . Gadolinium Nausea And Vomiting     Code: VOM, Desc: Pt began vomiting immed post infusion of multihance, Onset  Date: [id number]   . Ivp Dye [Iodinated Diagnostic Agents] Nausea And Vomiting    Nausea and vomiting   . Metformin Diarrhea     diarrhea  . Penicillins Hives    Hives     Medications: I have reviewed the patient's current medications.  Results for orders placed or performed during the hospital encounter of 12/07/14 (from the past 48 hour(s))  CBC with Differential     Status: Abnormal   Collection Time: 12/07/14  6:09 PM  Result Value Ref Range   WBC 10.5 4.0 - 10.5 K/uL   RBC 4.04 3.87 - 5.11 MIL/uL   Hemoglobin 11.4 (L) 12.0 - 15.0 g/dL   HCT 34.2 (L) 36.0 - 46.0 %   MCV 84.7 78.0 - 100.0 fL   MCH 28.2 26.0 - 34.0 pg   MCHC 33.3 30.0 - 36.0 g/dL   RDW 14.2 11.5 - 15.5 %   Platelets 151 150 - 400 K/uL   Neutrophils Relative % 73 43 - 77 %   Neutro Abs 7.7 1.7 - 7.7 K/uL   Lymphocytes Relative 19 12 - 46 %   Lymphs Abs 2.0 0.7 - 4.0 K/uL   Monocytes Relative 8 3 - 12 %   Monocytes Absolute 0.8 0.1 - 1.0 K/uL   Eosinophils Relative 0 0 - 5 %   Eosinophils Absolute 0.0 0.0 - 0.7 K/uL   Basophils Relative 0 0 - 1 %   Basophils Absolute 0.0 0.0 - 0.1 K/uL  Comprehensive metabolic panel     Status: Abnormal   Collection Time: 12/07/14  6:09 PM  Result Value Ref Range   Sodium 128 (L) 135 - 145 mmol/L   Potassium 4.4 3.5 - 5.1 mmol/L   Chloride 92 (L) 96 - 112 mmol/L   CO2 24 19 - 32 mmol/L   Glucose, Bld 532 (H) 70 - 99 mg/dL   BUN 33 (H) 6 - 23 mg/dL   Creatinine, Ser 1.65 (H) 0.50 - 1.10 mg/dL   Calcium 8.2 (L) 8.4 - 10.5 mg/dL   Total Protein 6.0 6.0 - 8.3 g/dL   Albumin 2.3 (L) 3.5 - 5.2 g/dL   AST 20 0 - 37 U/L   ALT 18 0 - 35 U/L   Alkaline Phosphatase 121 (H) 39 - 117 U/L   Total Bilirubin 0.5 0.3 - 1.2 mg/dL   GFR calc non Af Amer 34 (L) >90 mL/min   GFR calc Af Amer 40 (L) >90 mL/min    Comment: (NOTE) The eGFR has been calculated using the CKD EPI equation. This calculation has not been validated in all clinical situations. eGFR's persistently <90 mL/min  signify possible Chronic Kidney Disease.    Anion gap 12 5 - 15  POC CBG, ED     Status: Abnormal   Collection Time: 12/07/14  7:46 PM  Result Value Ref Range   Glucose-Capillary 516 (H) 70 - 99 mg/dL  Sedimentation rate     Status: Abnormal   Collection Time: 12/07/14  8:49 PM  Result Value Ref Range   Sed Rate 70 (H) 0 - 22 mm/hr  C-reactive protein     Status: Abnormal   Collection Time: 12/07/14  8:49 PM  Result Value Ref Range   CRP 27.6 (H) <0.60 mg/dL    Comment: Performed at [facility]  Urinalysis, Routine w reflex microscopic     Status: Abnormal   Collection Time: 12/07/14  9:25 PM  Result Value Ref Range   Color, Urine YELLOW YELLOW   APPearance CLOUDY (A) CLEAR   Specific Gravity, Urine 1.022 1.005 - 1.030   pH 5.0 5.0 - 8.0   Glucose, UA >1000 (A) NEGATIVE mg/dL   Hgb urine dipstick SMALL (A) NEGATIVE   Bilirubin Urine NEGATIVE NEGATIVE   Ketones, ur NEGATIVE NEGATIVE mg/dL   Protein, ur 100 (A) NEGATIVE mg/dL   Urobilinogen, UA 0.2 0.0 - 1.0 mg/dL   Nitrite POSITIVE (A) NEGATIVE   Leukocytes, UA NEGATIVE NEGATIVE  Urine microscopic-add on     Status: Abnormal   Collection Time: 12/07/14  9:25 PM  Result Value Ref Range   Squamous Epithelial / LPF RARE RARE   WBC, UA 0-2 <3 WBC/hpf   RBC / HPF 0-2 <3 RBC/hpf   Bacteria, UA FEW (A) RARE   Urine-Other YEAST   CBG monitoring, ED     Status: Abnormal   Collection Time: 12/08/14  1:56 AM  Result Value Ref Range   Glucose-Capillary 326 (H) 70 - 99 mg/dL  Glucose, capillary     Status: Abnormal   Collection Time: 12/08/14  3:06 AM  Result Value Ref Range   Glucose-Capillary 356 (H) 70 - 99 mg/dL  Basic metabolic panel     Status: Abnormal   Collection Time: 12/08/14  7:00 AM  Result Value Ref Range   Sodium 133 (L) 135 - 145 mmol/L   Potassium 4.2 3.5 - 5.1 mmol/L   Chloride 98 96 - 112 mmol/L   CO2 26 19 - 32 mmol/L   Glucose, Bld 455 (H) 70 - 99 mg/dL   BUN 31 (H) 6 - 23 mg/dL   Creatinine,  Ser 1.39 (H) 0.50 - 1.10 mg/dL   Calcium 8.3 (L) 8.4 - 10.5 mg/dL   GFR calc non Af Amer 42 (L) >90 mL/min   GFR calc Af Amer 49 (L) >90 mL/min    Comment: (NOTE) The eGFR has been calculated using the CKD EPI equation. This calculation has not been validated in all clinical situations. eGFR's persistently <90 mL/min signify possible Chronic Kidney Disease.    Anion gap 9 5 - 15  CBC     Status: Abnormal   Collection Time: 12/08/14  7:00 AM  Result Value Ref Range   WBC 9.3 4.0 - 10.5 K/uL   RBC 3.71 (L) 3.87 - 5.11 MIL/uL   Hemoglobin 10.5 (L) 12.0 - 15.0 g/dL   HCT 31.9 (L) 36.0 - 46.0 %   MCV 86.0 78.0 - 100.0 fL   MCH 28.3 26.0 - 34.0 pg   MCHC 32.9 30.0 - 36.0 g/dL   RDW 14.2 11.5 - 15.5 %   Platelets 151 150 -  400 K/uL  Glucose, capillary     Status: Abnormal   Collection Time: 12/08/14  7:47 AM  Result Value Ref Range   Glucose-Capillary 425 (H) 70 - 99 mg/dL  Glucose, capillary     Status: Abnormal   Collection Time: 12/08/14 12:04 PM  Result Value Ref Range   Glucose-Capillary 454 (H) 70 - 99 mg/dL  Glucose, capillary     Status: Abnormal   Collection Time: 12/08/14  4:30 PM  Result Value Ref Range   Glucose-Capillary 315 (H) 70 - 99 mg/dL  Hemoglobin A1c     Status: Abnormal   Collection Time: 12/08/14  6:00 PM  Result Value Ref Range   Hgb A1c MFr Bld 10.9 (H) 4.8 - 5.6 %    Comment: (NOTE)         Pre-diabetes: 5.7 - 6.4         Diabetes: >6.4         Glycemic control for adults with diabetes: <7.0    Mean Plasma Glucose 266 mg/dL    Comment: (NOTE) Performed At: [facility] [place], [place] [id number] Lindon Romp MD EX:[id number]   Glucose, capillary     Status: Abnormal   Collection Time: 12/08/14 10:31 PM  Result Value Ref Range   Glucose-Capillary 314 (H) 70 - 99 mg/dL  CBC     Status: Abnormal   Collection Time: 12/09/14  5:03 AM  Result Value Ref Range   WBC 7.6 4.0 - 10.5 K/uL   RBC 3.79 (L) 3.87 - 5.11 MIL/uL    Hemoglobin 10.4 (L) 12.0 - 15.0 g/dL   HCT 32.0 (L) 36.0 - 46.0 %   MCV 84.4 78.0 - 100.0 fL   MCH 27.4 26.0 - 34.0 pg   MCHC 32.5 30.0 - 36.0 g/dL   RDW 13.9 11.5 - 15.5 %   Platelets 152 150 - 400 K/uL  Basic metabolic panel     Status: Abnormal   Collection Time: 12/09/14  5:03 AM  Result Value Ref Range   Sodium 136 135 - 145 mmol/L   Potassium 4.2 3.5 - 5.1 mmol/L   Chloride 100 96 - 112 mmol/L   CO2 26 19 - 32 mmol/L   Glucose, Bld 276 (H) 70 - 99 mg/dL   BUN 19 6 - 23 mg/dL    Comment: DELTA CHECK NOTED   Creatinine, Ser 0.99 0.50 - 1.10 mg/dL   Calcium 9.1 8.4 - 10.5 mg/dL   GFR calc non Af Amer 64 (L) >90 mL/min   GFR calc Af Amer 74 (L) >90 mL/min    Comment: (NOTE) The eGFR has been calculated using the CKD EPI equation. This calculation has not been validated in all clinical situations. eGFR's persistently <90 mL/min signify possible Chronic Kidney Disease.    Anion gap 10 5 - 15    [place] Extrem Low Left Ltd  12/08/2014   CLINICAL DATA:  Left foot ulcer.  EXAM: ULTRASOUND left LOWER EXTREMITY LIMITED  TECHNIQUE: Ultrasound examination of the lower extremity soft tissues was performed in the area of clinical concern.  COMPARISON:  October 08, 2014.  FINDINGS: Sonographic evaluation of the medial aspect of the left foot along plantar surface demonstrates no fluid collection or abscess.  IMPRESSION: No definite abscess or fluid collection seen in the area of concern.   Electronically Signed   By: Marijo Conception, M.D.   On: 12/08/2014 15:54   Dg Knee Complete 4 Views Right  12/07/2014  CLINICAL DATA:  Right distal stump infection.  EXAM: RIGHT KNEE - COMPLETE 4+ VIEW  COMPARISON:  12/20/2011  FINDINGS: Below the knee amputation. Mild hypertrophic changes at the osteotomy with associated sclerosis at the margin. No destructive or resorptive change. No appreciable soft tissue gas. Enthesopathic changes at the patella.  IMPRESSION: Below the knee amputation with mild  sclerosis at the osteotomy margin which may be reactive or reflect sequelae of prior infection. Recommend MRI if clinical concern for acute osteomyelitis persists.   Electronically Signed   By: Carlos Levering M.D.   On: 12/07/2014 22:37   Dg Foot 2 Views Left  12/07/2014   CLINICAL DATA:  Soft tissues stump infection.  EXAM: LEFT FOOT - 2 VIEW  COMPARISON:  05/03/2011  FINDINGS: There has been previous amputation of the great toe at the metatarsal level, the second toe an the third toe at the phalangeal level. Soft tissues shows swelling and areas of lucency consistent with infection. There is chronic cortical thickening in the metatarsal region, most pronounced at the second. There is no finding specific for osteomyelitis presently. Chronic degenerative/ neuropathic changes remain evident in the midfoot.  IMPRESSION: No plain radiographic finding specific for osteomyelitis.   Electronically Signed   By: Nelson Chimes M.D.   On: 12/07/2014 22:35    Review of Systems  All other systems reviewed and are negative.  Blood pressure 167/83, pulse 83, temperature 98 F (36.7 C), temperature source Oral, resp. rate 17, height _0  (1.651 m), weight 110.406 kg (243 lb 6.4 oz), SpO2 93 %. Physical Exam On examination patient cellulitis is resolving on the right transtibial amputation however patient does have tenderness to palpation the local area of cellulitis which what appears to have fluctuance consistent with deep abscess. The Wagner grade 1 ulcer in the left foot is stable no signs of infection. Assessment/Plan: Assessment: Cellulitis abscess right transtibial amputation was stable ulceration left foot.  Plan: I will post the patient for irrigation and debridement of the transtibial amputation abscess on Friday if the infection resolves by Friday I will cancel her surgery.  [name] V 12/09/2014, 6:53 AM

## 2014-12-11 NOTE — Progress Notes (Signed)
Occupational Therapy Treatment Patient Details Name: [name] MRN: [id number] DOB: 25-Dec-1960 Today's Date: 12/11/2014    History of present illness [name] is a 54 year old woman with history of type 2 diabetes with peripheral neuropathy, peripheral vascular disease status post right BKA, multiple skin infections, hypothyroidism, hypertension, hyperlipidemia, bradycardia status post pacemaker and ICD placement, CHF, GERD, rheumatoid arthritis, depression, anxiety, obstructive sleep apnea, and COPD on home oxygen presenting with swelling and pain of her right BKA stump. She reports developing redness and swelling at her right BKA stump over the last week making it difficult to wear her prosthesis. Sores at axillae and plantar aspect of L foot   OT comments  Pt making progress with functional goals and should continue with acute OT services to increase level of function and safety. Pt states that she would prefer to go to a SNF for rehab like she has in the past but cannot afford it  Follow Up Recommendations  SNF;Supervision/Assistance - 24 hour;Home health OT (pt refusing SNF to to worry of cost per pt)    Equipment Recommendations  3 in 1 bedside comode;Tub/shower seat;Other (comment) (AD A/E)    Recommendations for Other Services      Precautions / Restrictions Restrictions Weight Bearing Restrictions: No Other Position/Activity Restrictions: pt agreeable to [facility] transfer       Mobility Bed Mobility Overal bed mobility: Modified Independent             General bed mobility comments: pt sat EOB x 20 minutes  Transfers Overall transfer level: Needs assistance Equipment used: None Transfers: Sit to/from [employer] Sit to Stand: Min guard Stand pivot transfers: Min guard            Balance Overall balance assessment: No apparent balance deficits (not formally assessed)                                 ADL                        Lower Body Dressing: Moderate assistance;Sitting/lateral leans;Minimal assistance   Toilet Transfer: Min Publishing copy Details (indicate cue type and reason): pt declined transfers/OOB activity Toileting- Clothing Manipulation and Hygiene: Min guard;Sitting/lateral lean                Vision  no change form baseline, pt states that she has difficulty reading her mail and managing meds due to poor eyesight                   Perception Perception Perception Tested?: No   Praxis Praxis Praxis tested?: Not tested    Cognition   Behavior During Therapy: WFL for tasks assessed/performed Overall Cognitive Status: Within Functional Limits for tasks assessed                       Extremity/Trunk Assessment   WFL                        General Comments  pt pleasant     Pertinent Vitals/ Pain       Pain Assessment: 0-10 Pain Score: 3  Pain Location: R stump Pain Descriptors / Indicators: Aching;Sore Pain Intervention(s): Repositioned  Home Living  home alone  Prior Functioning/Environment  Mod I           Frequency Min 2X/week     Progress Toward Goals  OT Goals(current goals can now be found in the care plan section)  Progress towards OT goals: Progressing toward goals     Plan Discharge plan remains appropriate                     End of Session Equipment Utilized During Treatment: Gait belt   Activity Tolerance Patient tolerated treatment well   Patient Left in bed   Nurse Communication          Time: 6948-5462 OT Time Calculation (min): 25 min  Charges: OT General Charges $OT Visit: 1 Procedure OT Treatments $Self Care/Home Management : 8-22 mins $Therapeutic Activity: 8-22 mins  Galen Manila 12/11/2014, 12:35 PM

## 2014-12-11 NOTE — Interval H&P Note (Signed)
History and Physical Interval Note:  12/11/2014 6:28 AM  [name]  has presented today for surgery, with the diagnosis of Abscess Right Below Knee Amputation  The various methods of treatment have been discussed with the patient and family. After consideration of risks, benefits and other options for treatment, the patient has consented to  Procedure(s): Right Below Knee Amputation Revision (Right) as a surgical intervention .  The patient's history has been reviewed, patient examined, no change in status, stable for surgery.  I have reviewed the patient's chart and labs.  Questions were answered to the patient's satisfaction.     Yoseline Andersson V

## 2014-12-11 NOTE — Progress Notes (Signed)
Pt. Refused cpap. RT informed pt. To notify if she changes her mind. 

## 2014-12-11 NOTE — Progress Notes (Signed)
Upper dentures returned to pt  

## 2014-12-11 NOTE — Care Management Note (Addendum)
CARE MANAGEMENT NOTE 12/11/2014  Patient:  [name], [name]   Account Number:  [id number]  Date Initiated:  12/08/2014  Documentation initiated by:  Lavoris Sparling  Subjective/Objective Assessment:   CM following for progression and d/c planning.     Action/Plan:   Noted referral for assist in checking CBG and adm Insulin. This is not available on [name] daily basis, unless pt applies obtains PCS services and they are able to assist. Otherwise SNF or ALF may be needed.   Anticipated DC Date:  12/16/2014   Anticipated DC Plan:           Choice offered to / List presented to:             Status of service:  In process, will continue to follow Medicare Important Message given?  YES (If response is "NO", the following Medicare IM given date fields will be blank) Date Medicare IM given:  12/11/2014 Medicare IM given by:  Alyssandra Hulsebus Date Additional Medicare IM given:   Additional Medicare IM given by:    Discharge Disposition:    Per UR Regulation:    If discussed at Long Length of Stay Meetings, dates discussed:    Comments:  12/11/2014 Met with pt re [place] needs and plan to arrange as much support from [facility] as possible at the time of d/c if pt is able to return to home, may require short term rehab. Plan for surgery today, will continue to follow post op for d/c needs.  CRoyal RN MPH, case manager, [phone]

## 2014-12-11 NOTE — Anesthesia Preprocedure Evaluation (Signed)
Anesthesia Evaluation  Patient identified by MRN, date of birth, ID band Patient awake    Reviewed: Allergy & Precautions, NPO status , Patient's Chart, lab work & pertinent test results  Airway Mallampati: II  TM Distance: >3 FB     Dental  (+) Edentulous Upper, Edentulous Lower   Pulmonary Current Smoker,  breath sounds clear to auscultation        Cardiovascular hypertension, Rhythm:Regular Rate:Normal     Neuro/Psych    GI/Hepatic   Endo/Other  diabetes  Renal/GU      Musculoskeletal   Abdominal   Peds  Hematology   Anesthesia Other Findings   Reproductive/Obstetrics                             Anesthesia Physical Anesthesia Plan  ASA: III  Anesthesia Plan: MAC and Spinal   Post-op Pain Management:    Induction:   Airway Management Planned: Natural Airway and Nasal Cannula  Additional Equipment:   Intra-op Plan:   Post-operative Plan:   Informed Consent: I have reviewed the patients History and Physical, chart, labs and discussed the procedure including the risks, benefits and alternatives for the proposed anesthesia with the patient or authorized representative who has indicated his/her understanding and acceptance.     Plan Discussed with: CRNA and Anesthesiologist  Anesthesia Plan Comments:         Anesthesia Quick Evaluation

## 2014-12-11 NOTE — Progress Notes (Signed)
Subjective:  Patient states that her pain is stable from previous days during this hospitalization. She states that she is ready for her procedure today. Patient not reporting any complaints.  Objective: Vital signs in last 24 hours: Filed Vitals:   12/11/14 1528 12/11/14 1530 12/11/14 1545 12/11/14 1600  BP: 139/50  147/56 144/75  Pulse: 63 68 66 70  Temp: 98 F (36.7 C)     TempSrc:      Resp: 12 11 12 12   Height:      Weight:      SpO2: 97% 97% 97% 97%   Weight change: -0.3 oz (-0.01 kg)  Intake/Output Summary (Last 24 hours) at 12/11/14 1644 Last data filed at 12/11/14 1129  Gross per 24 hour  Intake    630 ml  Output   2600 ml  Net  -1970 ml    Constitutional: She is oriented to person, place, and time and well-developed, well-nourished, and in no distress. No distress.  Obese.  HENT:  Crusted lesion on left upper lip associated with swelling of inner left cheek soft tissues, stable. Eyes: Conjunctivae and EOM are normal. Pupils are equal, round, and reactive to light. Bilateral eyes without discharge. No scleral icterus.  Neck: Normal range of motion. Neck supple.  Cardiovascular: Normal rate and regular rhythm. No murmurs, rubs or gallops Pulmonary/Chest: Effort normal and breath sounds normal. No respiratory distress. She has no wheezes.  Abdominal: Soft. Bowel sounds are normal. She exhibits no distension. There is no tenderness.  Musculoskeletal: Normal range of motion. She exhibits tenderness (right BKA stump). There is mild serosanguineous drainage at the stump.  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  Neurological: She is alert and oriented to person, place, and time. No cranial nerve deficit. She exhibits normal muscle tone.   Lab Results: Basic Metabolic Panel:  Recent Labs Lab 12/09/14 0503 12/11/14 0615  NA 136 135  K 4.2 4.6  CL 100 96  CO2 26 31  GLUCOSE 276* 246*  BUN 19 24*  CREATININE 0.99 1.09  CALCIUM  9.1 8.9   Liver Function Tests:  Recent Labs Lab 12/07/14 1809  AST 20  ALT 18  ALKPHOS 121*  BILITOT 0.5  PROT 6.0  ALBUMIN 2.3*   No results for input(s): LIPASE, AMYLASE in the last 168 hours. No results for input(s): AMMONIA in the last 168 hours. CBC:  Recent Labs Lab 12/07/14 1809  12/09/14 0503 12/11/14 0615  WBC 10.5  < > 7.6 8.4  NEUTROABS 7.7  --   --   --   HGB 11.4*  < > 10.4* 10.5*  HCT 34.2*  < > 32.0* 32.2*  MCV 84.7  < > 84.4 84.7  PLT 151  < > 152 201  < > = values in this interval not displayed. Cardiac Enzymes: No results for input(s): CKTOTAL, CKMB, CKMBINDEX, TROPONINI in the last 168 hours. BNP: No results for input(s): PROBNP in the last 168 hours. D-Dimer: No results for input(s): DDIMER in the last 168 hours. CBG:  Recent Labs Lab 12/10/14 1618 12/10/14 2104 12/11/14 0724 12/11/14 1113 12/11/14 1312 12/11/14 1536  GLUCAP 247* 170* 250* 204* 195* 163*   Hemoglobin A1C:  Recent Labs Lab 12/08/14 1800  HGBA1C 10.9*   Fasting Lipid Panel: No results for input(s): CHOL, HDL, LDLCALC, TRIG, CHOLHDL, LDLDIRECT in the last 168 hours. Thyroid Function Tests: No results for input(s): TSH, T4TOTAL, FREET4, T3FREE, THYROIDAB in the last 168 hours. Coagulation:  No results for input(s): LABPROT, INR in the last 168 hours. Anemia Panel: No results for input(s): VITAMINB12, FOLATE, FERRITIN, TIBC, IRON, RETICCTPCT in the last 168 hours. Urine Drug Screen: Drugs of Abuse     Component Value Date/Time   LABOPIA NONE DETECTED 04/21/2013 1531   COCAINSCRNUR NONE DETECTED 04/21/2013 1531   LABBENZ NONE DETECTED 04/21/2013 1531   AMPHETMU NONE DETECTED 04/21/2013 1531   THCU NONE DETECTED 04/21/2013 1531   LABBARB NONE DETECTED 04/21/2013 1531    Alcohol Level: No results for input(s): ETH in the last 168 hours. Urinalysis:  Recent Labs Lab 12/07/14 2125  COLORURINE YELLOW  LABSPEC 1.022  PHURINE 5.0  GLUCOSEU >1000*  HGBUR  SMALL*  BILIRUBINUR NEGATIVE  KETONESUR NEGATIVE  PROTEINUR 100*  UROBILINOGEN 0.2  NITRITE POSITIVE*  LEUKOCYTESUR NEGATIVE    Micro Results: Recent Results (from the past 240 hour(s))  Blood culture (routine x 2)     Status: None (Preliminary result)   Collection Time: 12/07/14  8:49 PM  Result Value Ref Range Status   Specimen Description BLOOD LEFT WRIST  Final   Special Requests BOTTLES DRAWN AEROBIC AND ANAEROBIC 3CC EA  Final   Culture   Final           BLOOD CULTURE RECEIVED NO GROWTH TO DATE CULTURE WILL BE HELD FOR 5 DAYS BEFORE ISSUING A FINAL NEGATIVE REPORT Performed at [facility]    Report Status PENDING  Incomplete  Blood culture (routine x 2)     Status: None (Preliminary result)   Collection Time: 12/07/14  8:50 PM  Result Value Ref Range Status   Specimen Description BLOOD RIGHT ANTECUBITAL  Final   Special Requests BOTTLES DRAWN AEROBIC AND ANAEROBIC 5CC EA  Final   Culture   Final           BLOOD CULTURE RECEIVED NO GROWTH TO DATE CULTURE WILL BE HELD FOR 5 DAYS BEFORE ISSUING A FINAL NEGATIVE REPORT Performed at [facility]    Report Status PENDING  Incomplete  Surgical pcr screen     Status: Abnormal   Collection Time: 12/11/14  5:00 AM  Result Value Ref Range Status   MRSA, PCR POSITIVE (A) NEGATIVE Final   Staphylococcus aureus POSITIVE (A) NEGATIVE Final    Comment:        The Xpert SA Assay (FDA approved for NASAL specimens in patients over 69 years of age), is one component of a comprehensive surveillance program.  Test performance has been validated by [facility] for patients greater than or equal to 10 year old. It is not intended to diagnose infection nor to guide or monitor treatment.    Studies/Results: [place] Extrem Low Right Ltd  12/10/2014   CLINICAL DATA:  Abscess of amputation stump.  EXAM: ULTRASOUND RIGHT LOWER EXTREMITY LIMITED  TECHNIQUE: Ultrasound examination of the lower extremity soft tissues was  performed in the area of clinical concern.  COMPARISON:  Ultrasound 12/08/2014.  FINDINGS: Diffuse edema is present in the subcutaneous tissues of the RIGHT below-the-knee amputation stump. There is an area of focal phlegmon in the subcutaneous fat anteriorly that measures 25 mm x 7 mm x 26 mm. This has internal echo does without definite increased through transmission. The borders are irregular and this is more compatible with phlegmon than abscess.  IMPRESSION: No abscess identified. 25 mm x 7 mm x 26 mm focal area of phlegmon in the soft tissues.   Electronically Signed   By: Charolette Child.D.  On: 12/10/2014 10:12   Medications: I have reviewed the patient's current medications. Scheduled Meds: . [MAR Hold] amLODipine  5 mg Oral Daily  . [MAR Hold] antiseptic oral rinse  7 mL Mouth Rinse BID  . [MAR Hold] aspirin EC  81 mg Oral Daily  . [MAR Hold] atorvastatin  80 mg Oral Daily  . [MAR Hold] Chlorhexidine Gluconate Cloth  6 each Topical Q0600  . [MAR Hold] clindamycin (CLEOCIN) IV  600 mg Intravenous 3 times per day  . [MAR Hold] feeding supplement (GLUCERNA SHAKE)  237 mL Oral Q1500  . [MAR Hold] feeding supplement (PRO-STAT SUGAR FREE 64)  30 mL Oral BID  . [MAR Hold] FLUoxetine  60 mg Oral Daily  . [MAR Hold] gabapentin  300 mg Oral TID  . [MAR Hold] heparin  5,000 Units Subcutaneous 3 times per day  . [MAR Hold] insulin aspart  0-15 Units Subcutaneous TID WC  . [MAR Hold] insulin aspart  0-5 Units Subcutaneous QHS  . [MAR Hold] insulin aspart  13 Units Subcutaneous TID WC  . [MAR Hold] insulin detemir  75 Units Subcutaneous Daily  . [MAR Hold] levothyroxine  25 mcg Oral QAC breakfast  . [MAR Hold] metoCLOPramide  10 mg Oral TID AC & HS  . [MAR Hold] mometasone-formoterol  2 puff Inhalation BID  . [MAR Hold] mupirocin ointment  1 application Nasal BID  . [MAR Hold] oxybutynin  5 mg Oral Q breakfast  . oxyCODONE      . [MAR Hold] pantoprazole  40 mg Oral Daily  . [MAR Hold] sodium  chloride  3 mL Intravenous Q12H  . [MAR Hold] tiotropium  18 mcg Inhalation Daily   Continuous Infusions:  PRN Meds:.fentaNYL (SUBLIMAZE) injection, [MAR Hold] HYDROcodone-acetaminophen, ondansetron (ZOFRAN) IV Assessment/Plan: Principal Problem:   Cellulitis Active Problems:   Diabetes mellitus type 2, uncontrolled, with complications   TOBACCO ABUSE   HTN (hypertension)   Anemia of chronic disease   Hypothyroidism   Acute on chronic kidney failure   OSA on CPAP with oxygen   Diabetic foot ulcer   #Right BKA stump cellulitis Patient is status post incision and drainage of her BKA cellulitis with underlying abscess. -Appreciate orthopedics management. -Continue clindamycin IV for 48 hours. Plan for discharge on oral antibiotics in 48 hours. -Vicodin 5-325 mg every 6 hours when necessary. -PT and OT recommending SNF  #Type 2 diabetes with peripheral neuropathy and diabetic retinopathy: CBGs 170s to 240s over the last 24 hours. A1c from this admission of 10.9. Over the last 24 hours, she has received 75 units long acting and a sum of 66 units short acting. She takes Levemir 200 units daily at home along with NovoLog sliding scale 8-25 units daily. -resistance sliding scale insulin -Continue Levemir at 75 units -Continue mealtime and nighttime coverage (continue mealtime coverage to 13 units 3 times a day) -Continue home gabapentin 300 mg 3 times a day. -Continue home metoclopramide 10 mg 4 times daily. -Continue home oxybutynin 5 mg daily.  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 tach on telemetry Follows with Dr. Graciela Husbands of cardiology, pacemaker placed in July 2015 for sinus node dysfunction. Patient noted to intermittently be ventricular tachycardia on telemetry while in the emergency room. -Interrogation of device not found in chart. Will recheck with representative.  -Continue to monitor on telemetry  #CKD:  AKI has resolved.   #COPD No acute  issues. -Continue home Advair [facility]) twice a day. -Continue home Spiriva daily. -Supple without oxygen with  goal saturation of 92%.  #Diastolic congestive heart failure Echocardiogram from July 2015 with grade 2 diastolic dysfunction, ejection fraction previously normal. -Continue home Lasix 40 mg daily.  #Hypothyroidism -Continue home Synthroid 25 g daily.  #Hypertension Patient with mildly elevated blood pressures 140s to 150s systolic. Likely exacerbated by pain currently from cellulitis.  -Continue home amlodipine 5 mg daily. -Holding home lisinopril in the setting recent AKI. May consider resuming given stabilization.  #Hyperlipidemia -Continue home atorvastatin 80 mg daily.  #GERD -Continue home PPI.  #Rheumatoid arthritis On ibuprofen at home. -Pain control as above.  #Depression/anxiety -Continue home Prozac 60 mg daily.  #DVT prophylaxis -Heparin.  Dispo: Likely discharge within 48 hours. -Follow up with wound care clinic -PT and OT recommending SNF  The patient does have a current PCP [name], MD), therefore will not be require OPC follow-up after discharge.   The patient does have transportation limitations that hinder transportation to clinic appointments.   LOS: 3 days   Services Needed at time of discharge: Y = Yes, Blank = No PT:   OT:   RN:   Equipment:   Other:    Harold Barban, MD 12/11/2014, 4:44 PM

## 2014-12-11 NOTE — Anesthesia Procedure Notes (Signed)
Spinal  Start time: 12/11/2014 2:45 PM End time: 12/11/2014 2:50 PM Staffing Performed by: anesthesiologist  Preanesthetic Checklist Completed: patient identified, site marked, surgical consent, pre-op evaluation, timeout performed, IV checked, risks and benefits discussed and monitors and equipment checked Spinal Block Patient position: right lateral decubitus Prep: ChloraPrep Patient monitoring: heart rate, cardiac monitor, continuous pulse ox and blood pressure Approach: right paramedian Location: L3-4 Injection technique: single-shot Needle Needle type: Pencil-Tip  Needle gauge: 22 G Assessment Sensory level: T10 Additional Notes 10 mg 0.75% Bupivacaine injected easily

## 2014-12-11 NOTE — Anesthesia Postprocedure Evaluation (Signed)
  Anesthesia Post-op Note  Patient: [name]  Procedure(s) Performed: Procedure(s): Right Below Knee Amputation Revision (Right)  Patient Location: PACU  Anesthesia Type:Spinal  Level of Consciousness: awake, alert  and oriented  Airway and Oxygen Therapy: Patient Spontanous Breathing  Post-op Pain: mild  Post-op Assessment: Post-op Vital signs reviewed, Patient's Cardiovascular Status Stable, Respiratory Function Stable, Patent Airway and Pain level controlled  Post-op Vital Signs: stable  Last Vitals:  Filed Vitals:   12/11/14 1734  BP: 143/57  Pulse: 70  Temp: 36.7 C  Resp: 16    Complications: No apparent anesthesia complications

## 2014-12-11 NOTE — Progress Notes (Signed)
Continue IV antibiotics for 72 hours postoperatively. Patient did have an abscess and cultures were obtained. Antibiotic beads are also placed deep within the wound. Anticipate discharge to home once cultures are finalized and plan for 3 weeks of oral antibiotics at discharge.  Plan to remove the Penrose drain on Monday.

## 2014-12-11 NOTE — Transfer of Care (Signed)
Immediate Anesthesia Transfer of Care Note  Patient: [name]  Procedure(s) Performed: Procedure(s): Right Below Knee Amputation Revision (Right)  Patient Location: PACU  Anesthesia Type:Spinal  Level of Consciousness: awake and oriented  Airway & Oxygen Therapy: Patient Spontanous Breathing and Patient connected to nasal cannula oxygen  Post-op Assessment: Report given to RN, Post -op Vital signs reviewed and stable and Patient moving all extremities  Post vital signs: Reviewed and stable  Last Vitals:  Filed Vitals:   12/11/14 0849  BP: 147/66  Pulse: 69  Temp: 36.8 C  Resp: 17    Complications: No apparent anesthesia complications

## 2014-12-11 NOTE — Op Note (Signed)
12/07/2014 - 12/11/2014  3:03 PM  PATIENT:  [name]    PRE-OPERATIVE DIAGNOSIS:  Abscess Right Below Knee Amputation  POST-OPERATIVE DIAGNOSIS:  Same  PROCEDURE:  Right Below Knee Amputation Revision  SURGEON:  Nadara Mustard, MD  PHYSICIAN ASSISTANT:None ANESTHESIA:   General  PREOPERATIVE INDICATIONS:  [name] is a  54 y.o. female with a diagnosis of Abscess Right Below Knee Amputation who failed conservative measures and elected for surgical management.    The risks benefits and alternatives were discussed with the patient preoperatively including but not limited to the risks of infection, bleeding, nerve injury, cardiopulmonary complications, the need for revision surgery, among others, and the patient was willing to proceed.  OPERATIVE IMPLANTS: stimulan beads 500mg  vanc, 160mg  gent  OPERATIVE FINDINGS: Deep abscess.  OPERATIVE PROCEDURE: Patient was brought to the operating room and underwent a spinal anesthetic. After adequate levels of anesthesia were obtained patient's right transtibial amputation was prepped using DuraPrep draped into a sterile field a timeout was called. An incision was made through the previous surgical incision this was carried down through a large bursal abscess just dorsal to the tibia. The liner the abscess was excised the wound was irrigated with normal saline local tissue rearrangement was performed for wound closure. There is 2 other small areas of purulent drainage these were also opened and several additional pockets of abscess were identified drained and a Penrose drain was placed. The wounds again irrigated with normal saline. Antibiotic beads were placed within the abscess bursa. The skin was closed using 2-0 nylon. A sterile compressive dressing was applied. Patient was taken to the PACU in stable condition.  Resume IV antibiotics. Anticipate discharge in 48 hours on oral antibiotics.

## 2014-12-12 LAB — CBC
HEMATOCRIT: 31.1 % — AB (ref 36.0–46.0)
Hemoglobin: 10 g/dL — ABNORMAL LOW (ref 12.0–15.0)
MCH: 27.3 pg (ref 26.0–34.0)
MCHC: 32.2 g/dL (ref 30.0–36.0)
MCV: 85 fL (ref 78.0–100.0)
PLATELETS: 239 10*3/uL (ref 150–400)
RBC: 3.66 MIL/uL — AB (ref 3.87–5.11)
RDW: 13.9 % (ref 11.5–15.5)
WBC: 9.8 10*3/uL (ref 4.0–10.5)

## 2014-12-12 LAB — BASIC METABOLIC PANEL
ANION GAP: 7 (ref 5–15)
BUN: 23 mg/dL (ref 6–23)
CO2: 31 mmol/L (ref 19–32)
CREATININE: 1.17 mg/dL — AB (ref 0.50–1.10)
Calcium: 8.7 mg/dL (ref 8.4–10.5)
Chloride: 94 mmol/L — ABNORMAL LOW (ref 96–112)
GFR calc non Af Amer: 52 mL/min — ABNORMAL LOW (ref >90)
GFR, EST AFRICAN AMERICAN: 61 mL/min — AB (ref >90)
Glucose, Bld: 327 mg/dL — ABNORMAL HIGH (ref 70–99)
POTASSIUM: 5.2 mmol/L — AB (ref 3.5–5.1)
Sodium: 132 mmol/L — ABNORMAL LOW (ref 135–145)

## 2014-12-12 LAB — GLUCOSE, CAPILLARY
GLUCOSE-CAPILLARY: 317 mg/dL — AB (ref 70–99)
GLUCOSE-CAPILLARY: 343 mg/dL — AB (ref 70–99)
GLUCOSE-CAPILLARY: 223 mg/dL — AB (ref 70–99)
Glucose-Capillary: 167 mg/dL — ABNORMAL HIGH (ref 70–99)

## 2014-12-12 MED ORDER — INSULIN DETEMIR 100 UNIT/ML ~~LOC~~ SOLN
77.0000 [IU] | Freq: Every day | SUBCUTANEOUS | Status: DC
  Filled 2014-12-12: qty 0.77

## 2014-12-12 MED ORDER — HYDROCODONE-ACETAMINOPHEN 5-325 MG PO TABS
1.0000 | ORAL_TABLET | ORAL | Status: DC | PRN
  Administered 2014-12-13 (×2): 2 via ORAL
  Filled 2014-12-12 (×4): qty 2

## 2014-12-12 NOTE — Progress Notes (Signed)
Please note, there are no pending abscess culture results to guide abx therapy.

## 2014-12-12 NOTE — Progress Notes (Signed)
Med scanned by RN, but administered by RT.

## 2014-12-12 NOTE — Progress Notes (Addendum)
Subjective:  VSS. Pt c/o leg pain but states she just took a pain pill.  She is in the process of bathing.    Objective: Vital signs in last 24 hours: Filed Vitals:   12/12/14 2100 12/13/14 0504 12/13/14 0900 12/13/14 1143  BP: 143/66 117/59 130/64 124/59  Pulse: 80 78 86 83  Temp: 98.7 F (37.1 C) 98.3 F (36.8 C) 98.6 F (37 C) 97.9 F (36.6 C)  TempSrc: Oral Oral Oral Oral  Resp: 16 17 17 17   Height:      Weight: 106.867 kg (235 lb 9.6 oz)     SpO2: 93% 94% 93%    Weight change: -2.233 kg (-4 lb 14.8 oz)  Intake/Output Summary (Last 24 hours) at 12/13/14 1306 Last data filed at 12/13/14 1144  Gross per 24 hour  Intake   1195 ml  Output   1425 ml  Net   -230 ml    Constitutional: She is oriented to person, place, and time and well-developed, well-nourished, and in no distress. No distress.  HENT:  Crusted lesion on left upper lip associated with swelling of inner left cheek soft tissues, draining serosanguineous fluid. Eyes: Conjunctivae and EOM are normal. Bilateral eyes without discharge. No scleral icterus.  Neck: Normal range of motion. Neck supple.  Cardiovascular: Normal rate and regular rhythm. No murmurs, rubs or gallops Pulmonary/Chest: Effort normal and breath sounds normal. No respiratory distress. She has no wheezes.  Abdominal: Obese. Soft. Bowel sounds are normal. She exhibits no distension. There is no tenderness.  Musculoskeletal: R BKA stump is wrapped in ACE bandage. 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  Neurological: She is alert and oriented to person, place, and time. No cranial nerve deficit. She exhibits normal muscle tone.   Lab Results: Basic Metabolic Panel:  Recent Labs Lab 12/12/14 0655 12/13/14 0505  NA 132* 132*  K 5.2* 4.4  CL 94* 92*  CO2 31 30  GLUCOSE 327* 289*  BUN 23 39*  CREATININE 1.17* 1.70*  CALCIUM 8.7 9.0  PHOS  --  5.7*   Liver Function Tests:  Recent Labs Lab 12/07/14 1809  12/13/14 0505  AST 20  --   ALT 18  --   ALKPHOS 121*  --   BILITOT 0.5  --   PROT 6.0  --   ALBUMIN 2.3* 1.8*   No results for input(s): LIPASE, AMYLASE in the last 168 hours. No results for input(s): AMMONIA in the last 168 hours. CBC:  Recent Labs Lab 12/07/14 1809  12/12/14 0655 12/13/14 0505  WBC 10.5  < > 9.8 8.9  NEUTROABS 7.7  --   --   --   HGB 11.4*  < > 10.0* 8.8*  HCT 34.2*  < > 31.1* 27.1*  MCV 84.7  < > 85.0 85.2  PLT 151  < > 239 231  < > = values in this interval not displayed.  CBG:  Recent Labs Lab 12/12/14 0806 12/12/14 1141 12/12/14 1658 12/12/14 2139 12/13/14 0725 12/13/14 1143  GLUCAP 317* 343* 167* 223* 248* 208*   Hemoglobin A1C:  Recent Labs Lab 12/08/14 1800  HGBA1C 10.9*   Coagulation: No results for input(s): LABPROT, INR in the last 168 hours. Anemia Panel: No results for input(s): VITAMINB12, FOLATE, FERRITIN, TIBC, IRON, RETICCTPCT in the last 168 hours. Urine Drug Screen: Drugs of Abuse     Component Value Date/Time   LABOPIA NONE DETECTED 04/21/2013 1531   COCAINSCRNUR NONE DETECTED 04/21/2013 1531  LABBENZ NONE DETECTED 04/21/2013 1531   AMPHETMU NONE DETECTED 04/21/2013 1531   THCU NONE DETECTED 04/21/2013 1531   LABBARB NONE DETECTED 04/21/2013 1531    Alcohol Level: No results for input(s): ETH in the last 168 hours. Urinalysis:  Recent Labs Lab 12/07/14 2125  COLORURINE YELLOW  LABSPEC 1.022  PHURINE 5.0  GLUCOSEU >1000*  HGBUR SMALL*  BILIRUBINUR NEGATIVE  KETONESUR NEGATIVE  PROTEINUR 100*  UROBILINOGEN 0.2  NITRITE POSITIVE*  LEUKOCYTESUR NEGATIVE    Micro Results: Recent Results (from the past 240 hour(s))  Blood culture (routine x 2)     Status: None (Preliminary result)   Collection Time: 12/07/14  8:49 PM  Result Value Ref Range Status   Specimen Description BLOOD LEFT WRIST  Final   Special Requests BOTTLES DRAWN AEROBIC AND ANAEROBIC 3CC EA  Final   Culture   Final           BLOOD  CULTURE RECEIVED NO GROWTH TO DATE CULTURE WILL BE HELD FOR 5 DAYS BEFORE ISSUING A FINAL NEGATIVE REPORT Performed at [facility]    Report Status PENDING  Incomplete  Blood culture (routine x 2)     Status: None (Preliminary result)   Collection Time: 12/07/14  8:50 PM  Result Value Ref Range Status   Specimen Description BLOOD RIGHT ANTECUBITAL  Final   Special Requests BOTTLES DRAWN AEROBIC AND ANAEROBIC 5CC EA  Final   Culture   Final           BLOOD CULTURE RECEIVED NO GROWTH TO DATE CULTURE WILL BE HELD FOR 5 DAYS BEFORE ISSUING A FINAL NEGATIVE REPORT Performed at [facility]    Report Status PENDING  Incomplete  Surgical pcr screen     Status: Abnormal   Collection Time: 12/11/14  5:00 AM  Result Value Ref Range Status   MRSA, PCR POSITIVE (A) NEGATIVE Final   Staphylococcus aureus POSITIVE (A) NEGATIVE Final    Comment:        The Xpert SA Assay (FDA approved for NASAL specimens in patients over 23 years of age), is one component of a comprehensive surveillance program.  Test performance has been validated by [facility] for patients greater than or equal to 74 year old. It is not intended to diagnose infection nor to guide or monitor treatment.    Studies/Results: No results found. Medications: I have reviewed the patient's current medications. Scheduled Meds: . amLODipine  5 mg Oral Daily  . antiseptic oral rinse  7 mL Mouth Rinse BID  . aspirin EC  81 mg Oral Daily  . atorvastatin  80 mg Oral Daily  . Chlorhexidine Gluconate Cloth  6 each Topical Q0600  . clindamycin (CLEOCIN) IV  600 mg Intravenous 3 times per day  . feeding supplement (GLUCERNA SHAKE)  237 mL Oral Q1500  . feeding supplement (PRO-STAT SUGAR FREE 64)  30 mL Oral BID  . FLUoxetine  60 mg Oral Daily  . gabapentin  300 mg Oral TID  . heparin  5,000 Units Subcutaneous 3 times per day  . insulin aspart  0-20 Units Subcutaneous TID WC  . insulin aspart  0-5 Units Subcutaneous  QHS  . insulin aspart  13 Units Subcutaneous TID WC  . insulin detemir  77 Units Subcutaneous Daily  . levothyroxine  25 mcg Oral QAC breakfast  . metoCLOPramide  10 mg Oral TID AC & HS  . mometasone-formoterol  2 puff Inhalation BID  . mupirocin ointment  1 application Nasal  BID  . oxybutynin  5 mg Oral Q breakfast  . pantoprazole  40 mg Oral Daily  . sodium chloride  3 mL Intravenous Q12H  . tiotropium  18 mcg Inhalation Daily   Continuous Infusions: . sodium chloride Stopped (12/13/14 1012)  . sodium chloride 75 mL/hr at 12/13/14 1012   PRN Meds:.acetaminophen **OR** acetaminophen, HYDROcodone-acetaminophen, HYDROmorphone (DILAUDID) injection, methocarbamol **OR** methocarbamol (ROBAXIN)  IV, oxyCODONE Assessment/Plan: Principal Problem:   Cellulitis Active Problems:   Diabetes mellitus type 2, uncontrolled, with complications   TOBACCO ABUSE   HTN (hypertension)   Anemia of chronic disease   Hypothyroidism   Acute on chronic kidney failure   OSA on CPAP with oxygen   Diabetic foot ulcer  S/p Right BKA Abscess I&D on 4/22 Patient is postop day 2 s/p I&D of her BKA cellulitis with underlying abscess.  Drain in place. VSS.  No wound cx pending.  Blood cx pending NGTD.  -Appreciate ortho mgmt -Cont clindamycin IV until 4/25.  Plan for discharge on oral antibiotics.   -No wound cx pending  -dilaudid q3h PRN for severe pain  -Vicodin 5-325 mg q6h PRN -PT and OT recommending SNF (consulted CSW)   DMII with complications  At home, she takes Levemir 200 units daily at home along with NovoLog sliding scale 8-25 units daily. -resistance sliding scale insulin -increase Levemir at 77 units -Continue mealtime and nighttime coverage (continue mealtime coverage to 13 units 3 times a day) -Continue home gabapentin 300 mg 3 times a day. -Continue home metoclopramide 10 mg 4 times daily. -Continue home oxybutynin 5 mg daily. -Continue statin   Acute on chronic kidney disease    -monitor (no NSAIDs, IV contrast, ACEi/ARB, hypotension)  -add NS 18ml/h for 12 hrs   COPD -Continue home Advair ([place]) twice a day. -Continue home Spiriva daily. -Supple without oxygen with goal saturation of 92%.  Diastolic heart failure Echocardiogram from July 2015 with grade 2 diastolic dysfunction, ejection fraction previously normal. -d/c lasix, SCr elevated   Hypothyroidism -Continue home Synthroid 25 g daily.  Hypertension -Continue home amlodipine 5 mg daily. -Holding home lisinopril in the setting recent AKI. May consider resuming given stabilization.  Dispo: Likely discharge by 12/14/2014. -Follow up with wound care clinic -PT and OT recommending SNF  The patient does have a current PCP [name], MD), therefore will not be require OPC follow-up after discharge.   The patient does have transportation limitations that hinder transportation to clinic appointments.   LOS: 5 days    Nichole Salvage, MD 12/13/2014, 1:06 PM

## 2014-12-12 NOTE — Progress Notes (Signed)
PT Cancellation Note  Patient Details Name: [name] MRN: [id number] DOB: 11/22/1960   Cancelled Treatment:    Reason Eval/Treat Not Completed: Pain limiting ability to participate. Pt was just back to bed from [facility] and RN just provided pain medication. Pt requested to defer PT at this time.   Lilie Vezina 12/12/2014, 1:18 PM  Pager [phone]

## 2014-12-12 NOTE — Progress Notes (Addendum)
Subjective:  Patient states that she is having some increased pain after her surgery but is not reporting any other complaints.  Objective: Vital signs in last 24 hours: Filed Vitals:   12/11/14 2125 12/12/14 0226 12/12/14 0600 12/12/14 0817  BP: 132/67  144/59   Pulse: 79  70   Temp: 98.7 F (37.1 C)  99.8 F (37.7 C)   TempSrc: Oral  Oral   Resp: 16  13   Height: 5\' 5"  (1.651 m)     Weight: 240 lb 8.4 oz (109.1 kg) 240 lb 8.4 oz (109.1 kg)    SpO2: 90%  90% 93%   Weight change: 1 lb 8.7 oz (0.7 kg)  Intake/Output Summary (Last 24 hours) at 12/12/14 1242 Last data filed at 12/12/14 0600  Gross per 24 hour  Intake    585 ml  Output    450 ml  Net    135 ml    Constitutional: She is oriented to person, place, and time and well-developed, well-nourished, and in no distress. No distress.  Obese.  HENT:  Crusted lesion on left upper lip associated with swelling of inner left cheek soft tissues, draining serosanguineous fluid. Eyes: Conjunctivae and EOM are normal. Pupils are equal, round, and reactive to light. Bilateral eyes without discharge. No scleral icterus.  Neck: Normal range of motion. Neck supple.  Cardiovascular: Normal rate and regular rhythm. No murmurs, rubs or gallops Pulmonary/Chest: Effort normal and breath sounds normal. No respiratory distress. She has no wheezes.  Abdominal: Soft. Bowel sounds are normal. She exhibits no distension. There is no tenderness.  Musculoskeletal: Normal range of motion. She exhibits tenderness (right BKA stump). There is mild serosanguineous drainage at the stump.  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  Neurological: She is alert and oriented to person, place, and time. No cranial nerve deficit. She exhibits normal muscle tone.   Lab Results: Basic Metabolic Panel:  Recent Labs Lab 12/11/14 0615 12/12/14 0655  NA 135 132*  K 4.6 5.2*  CL 96 94*  CO2 31 31  GLUCOSE 246* 327*  BUN 24*  23  CREATININE 1.09 1.17*  CALCIUM 8.9 8.7   Liver Function Tests:  Recent Labs Lab 12/07/14 1809  AST 20  ALT 18  ALKPHOS 121*  BILITOT 0.5  PROT 6.0  ALBUMIN 2.3*   No results for input(s): LIPASE, AMYLASE in the last 168 hours. No results for input(s): AMMONIA in the last 168 hours. CBC:  Recent Labs Lab 12/07/14 1809  12/11/14 0615 12/12/14 0655  WBC 10.5  < > 8.4 9.8  NEUTROABS 7.7  --   --   --   HGB 11.4*  < > 10.5* 10.0*  HCT 34.2*  < > 32.2* 31.1*  MCV 84.7  < > 84.7 85.0  PLT 151  < > 201 239  < > = values in this interval not displayed. Cardiac Enzymes: No results for input(s): CKTOTAL, CKMB, CKMBINDEX, TROPONINI in the last 168 hours. BNP: No results for input(s): PROBNP in the last 168 hours. D-Dimer: No results for input(s): DDIMER in the last 168 hours. CBG:  Recent Labs Lab 12/11/14 1312 12/11/14 1536 12/11/14 1724 12/11/14 2123 12/12/14 0806 12/12/14 1141  GLUCAP 195* 163* 158* 322* 317* 343*   Hemoglobin A1C:  Recent Labs Lab 12/08/14 1800  HGBA1C 10.9*   Fasting Lipid Panel: No results for input(s): CHOL, HDL, LDLCALC, TRIG, CHOLHDL, LDLDIRECT in the last 168 hours. Thyroid Function Tests: No  results for input(s): TSH, T4TOTAL, FREET4, T3FREE, THYROIDAB in the last 168 hours. Coagulation: No results for input(s): LABPROT, INR in the last 168 hours. Anemia Panel: No results for input(s): VITAMINB12, FOLATE, FERRITIN, TIBC, IRON, RETICCTPCT in the last 168 hours. Urine Drug Screen: Drugs of Abuse     Component Value Date/Time   LABOPIA NONE DETECTED 04/21/2013 1531   COCAINSCRNUR NONE DETECTED 04/21/2013 1531   LABBENZ NONE DETECTED 04/21/2013 1531   AMPHETMU NONE DETECTED 04/21/2013 1531   THCU NONE DETECTED 04/21/2013 1531   LABBARB NONE DETECTED 04/21/2013 1531    Alcohol Level: No results for input(s): ETH in the last 168 hours. Urinalysis:  Recent Labs Lab 12/07/14 2125  COLORURINE YELLOW  LABSPEC 1.022    PHURINE 5.0  GLUCOSEU >1000*  HGBUR SMALL*  BILIRUBINUR NEGATIVE  KETONESUR NEGATIVE  PROTEINUR 100*  UROBILINOGEN 0.2  NITRITE POSITIVE*  LEUKOCYTESUR NEGATIVE    Micro Results: Recent Results (from the past 240 hour(s))  Blood culture (routine x 2)     Status: None (Preliminary result)   Collection Time: 12/07/14  8:49 PM  Result Value Ref Range Status   Specimen Description BLOOD LEFT WRIST  Final   Special Requests BOTTLES DRAWN AEROBIC AND ANAEROBIC 3CC EA  Final   Culture   Final           BLOOD CULTURE RECEIVED NO GROWTH TO DATE CULTURE WILL BE HELD FOR 5 DAYS BEFORE ISSUING A FINAL NEGATIVE REPORT Performed at [facility]    Report Status PENDING  Incomplete  Blood culture (routine x 2)     Status: None (Preliminary result)   Collection Time: 12/07/14  8:50 PM  Result Value Ref Range Status   Specimen Description BLOOD RIGHT ANTECUBITAL  Final   Special Requests BOTTLES DRAWN AEROBIC AND ANAEROBIC 5CC EA  Final   Culture   Final           BLOOD CULTURE RECEIVED NO GROWTH TO DATE CULTURE WILL BE HELD FOR 5 DAYS BEFORE ISSUING A FINAL NEGATIVE REPORT Performed at [facility]    Report Status PENDING  Incomplete  Surgical pcr screen     Status: Abnormal   Collection Time: 12/11/14  5:00 AM  Result Value Ref Range Status   MRSA, PCR POSITIVE (A) NEGATIVE Final   Staphylococcus aureus POSITIVE (A) NEGATIVE Final    Comment:        The Xpert SA Assay (FDA approved for NASAL specimens in patients over 70 years of age), is one component of a comprehensive surveillance program.  Test performance has been validated by [facility] for patients greater than or equal to 1 year old. It is not intended to diagnose infection nor to guide or monitor treatment.    Studies/Results: No results found. Medications: I have reviewed the patient's current medications. Scheduled Meds: . amLODipine  5 mg Oral Daily  . antiseptic oral rinse  7 mL Mouth Rinse  BID  . aspirin EC  81 mg Oral Daily  . atorvastatin  80 mg Oral Daily  . Chlorhexidine Gluconate Cloth  6 each Topical Q0600  . clindamycin (CLEOCIN) IV  600 mg Intravenous 3 times per day  . feeding supplement (GLUCERNA SHAKE)  237 mL Oral Q1500  . feeding supplement (PRO-STAT SUGAR FREE 64)  30 mL Oral BID  . FLUoxetine  60 mg Oral Daily  . gabapentin  300 mg Oral TID  . heparin  5,000 Units Subcutaneous 3 times per day  .  insulin aspart  0-15 Units Subcutaneous TID WC  . insulin aspart  0-5 Units Subcutaneous QHS  . insulin aspart  13 Units Subcutaneous TID WC  . insulin detemir  75 Units Subcutaneous Daily  . levothyroxine  25 mcg Oral QAC breakfast  . metoCLOPramide  10 mg Oral TID AC & HS  . mometasone-formoterol  2 puff Inhalation BID  . mupirocin ointment  1 application Nasal BID  . oxybutynin  5 mg Oral Q breakfast  . pantoprazole  40 mg Oral Daily  . sodium chloride  3 mL Intravenous Q12H  . tiotropium  18 mcg Inhalation Daily   Continuous Infusions: . sodium chloride 10 mL/hr (12/11/14 1830)   PRN Meds:.acetaminophen **OR** acetaminophen, HYDROcodone-acetaminophen, HYDROmorphone (DILAUDID) injection, methocarbamol **OR** methocarbamol (ROBAXIN)  IV, oxyCODONE Assessment/Plan: Principal Problem:   Cellulitis Active Problems:   Diabetes mellitus type 2, uncontrolled, with complications   TOBACCO ABUSE   HTN (hypertension)   Anemia of chronic disease   Hypothyroidism   Acute on chronic kidney failure   OSA on CPAP with oxygen   Diabetic foot ulcer  #Right BKA stump cellulitis Patient is postop day 1 status post incision and drainage of her BKA cellulitis with underlying abscess. -Appreciate orthopedics management. -Continue clindamycin IV for 72 hours after surgery. Plan for discharge on oral antibiotics roughly 12/14/2014. -Wound culture pending -Dilaudid 1 mg every 2 hours when necessary -Vicodin 5-325 mg every 6 hours when necessary. -PT and OT recommending  SNF  #Type 2 diabetes with peripheral neuropathy and diabetic retinopathy: CBGs somewhat less well controlled over the last 24 hours ranging from 163-322. Patient missed her dose of Levemir yesterday secondary to surgery. At home, she takes Levemir 200 units daily at home along with NovoLog sliding scale 8-25 units daily. -resistance sliding scale insulin -Continue Levemir at 75 units -Continue mealtime and nighttime coverage (continue mealtime coverage to 13 units 3 times a day) -Continue home gabapentin 300 mg 3 times a day. -Continue home metoclopramide 10 mg 4 times daily. -Continue home oxybutynin 5 mg daily.  #Mild hyperkalemia: Likely secondary to surgery. Only mildly elevated. Renal function overall stable.  -Continue to monitor.   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 No repeat episodes on telemetry since. -Continue to monitor on telemetry  #CKD: Slight rise in creatinine to 1.17 from 1.1. Likely in the postoperative period with compromise fluid status. -We'll continue to monitor  #COPD No acute issues. -Continue home Advair [facility]) twice a day. -Continue home Spiriva daily. -Supple without oxygen with goal saturation of 92%.  #Diastolic congestive heart failure Echocardiogram from July 2015 with grade 2 diastolic dysfunction, ejection fraction previously normal. -Continue home Lasix 40 mg daily.  #Hypothyroidism -Continue home Synthroid 25 g daily.  #Hypertension Patient with mildly elevated blood pressures 132-159 systolic. Exacerbated by pain in the postoperative period. -Continue home amlodipine 5 mg daily. -Holding home lisinopril in the setting recent AKI. May consider resuming given stabilization.  #Hyperlipidemia -Continue home atorvastatin 80 mg daily.  #GERD -Continue home PPI.  #Rheumatoid arthritis On ibuprofen at home. -Pain control as  above.  #Depression/anxiety -Continue home Prozac 60 mg daily.  #DVT prophylaxis -Heparin.  Dispo: Likely discharge by 12/14/2014. -Follow up with wound care clinic -PT and OT recommending SNF  The patient does have a current PCP [name], MD), therefore will not be require OPC follow-up after discharge.   The patient does have transportation limitations  that hinder transportation to clinic appointments.   LOS: 4 days   Services Needed at time of discharge: Y = Yes, Blank = No PT:   OT:   RN:   Equipment:   Other:    Harold Barban, MD 12/12/2014, 12:42 PM

## 2014-12-13 LAB — RENAL FUNCTION PANEL
ALBUMIN: 1.8 g/dL — AB (ref 3.5–5.2)
Anion gap: 10 (ref 5–15)
BUN: 39 mg/dL — AB (ref 6–23)
CALCIUM: 9 mg/dL (ref 8.4–10.5)
CO2: 30 mmol/L (ref 19–32)
Chloride: 92 mmol/L — ABNORMAL LOW (ref 96–112)
Creatinine, Ser: 1.7 mg/dL — ABNORMAL HIGH (ref 0.50–1.10)
GFR calc non Af Amer: 33 mL/min — ABNORMAL LOW (ref >90)
GFR, EST AFRICAN AMERICAN: 39 mL/min — AB (ref >90)
Glucose, Bld: 289 mg/dL — ABNORMAL HIGH (ref 70–99)
Phosphorus: 5.7 mg/dL — ABNORMAL HIGH (ref 2.3–4.6)
Potassium: 4.4 mmol/L (ref 3.5–5.1)
Sodium: 132 mmol/L — ABNORMAL LOW (ref 135–145)

## 2014-12-13 LAB — CBC
HEMATOCRIT: 27.1 % — AB (ref 36.0–46.0)
Hemoglobin: 8.8 g/dL — ABNORMAL LOW (ref 12.0–15.0)
MCH: 27.7 pg (ref 26.0–34.0)
MCHC: 32.5 g/dL (ref 30.0–36.0)
MCV: 85.2 fL (ref 78.0–100.0)
Platelets: 231 10*3/uL (ref 150–400)
RBC: 3.18 MIL/uL — AB (ref 3.87–5.11)
RDW: 14.2 % (ref 11.5–15.5)
WBC: 8.9 10*3/uL (ref 4.0–10.5)

## 2014-12-13 LAB — GLUCOSE, CAPILLARY
GLUCOSE-CAPILLARY: 208 mg/dL — AB (ref 70–99)
GLUCOSE-CAPILLARY: 229 mg/dL — AB (ref 70–99)
GLUCOSE-CAPILLARY: 263 mg/dL — AB (ref 70–99)
Glucose-Capillary: 248 mg/dL — ABNORMAL HIGH (ref 70–99)

## 2014-12-13 MED ORDER — SODIUM CHLORIDE 0.9 % IV SOLN: INTRAVENOUS | Status: AC

## 2014-12-13 MED ORDER — INSULIN ASPART 100 UNIT/ML ~~LOC~~ SOLN
0.0000 [IU] | Freq: Three times a day (TID) | SUBCUTANEOUS | Status: DC
  Administered 2014-12-13 (×2): 7 [IU] via SUBCUTANEOUS
  Administered 2014-12-14: 11 [IU] via SUBCUTANEOUS
  Administered 2014-12-14: 15 [IU] via SUBCUTANEOUS

## 2014-12-13 MED ORDER — SODIUM CHLORIDE 0.9 % IV SOLN: INTRAVENOUS | Status: DC

## 2014-12-13 MED ORDER — HYDROMORPHONE HCL 1 MG/ML IJ SOLN
1.0000 mg | INTRAMUSCULAR | Status: DC | PRN
  Administered 2014-12-13 (×3): 1 mg via INTRAVENOUS
  Filled 2014-12-13 (×4): qty 1

## 2014-12-13 MED ORDER — INSULIN DETEMIR 100 UNIT/ML ~~LOC~~ SOLN: 79.0000 [IU] | Freq: Every day | SUBCUTANEOUS | Status: DC

## 2014-12-13 MED ORDER — INSULIN DETEMIR 100 UNIT/ML ~~LOC~~ SOLN
77.0000 [IU] | Freq: Every day | SUBCUTANEOUS | Status: DC
  Administered 2014-12-13 – 2014-12-14 (×2): 77 [IU] via SUBCUTANEOUS
  Filled 2014-12-13 (×2): qty 0.77

## 2014-12-14 ENCOUNTER — Encounter (HOSPITAL_COMMUNITY): Payer: Self-pay | Admitting: Orthopedic Surgery

## 2014-12-14 LAB — RENAL FUNCTION PANEL
ALBUMIN: 2 g/dL — AB (ref 3.5–5.2)
Anion gap: 10 (ref 5–15)
BUN: 40 mg/dL — AB (ref 6–23)
CALCIUM: 8.8 mg/dL (ref 8.4–10.5)
CO2: 29 mmol/L (ref 19–32)
Chloride: 91 mmol/L — ABNORMAL LOW (ref 96–112)
Creatinine, Ser: 1.34 mg/dL — ABNORMAL HIGH (ref 0.50–1.10)
GFR calc Af Amer: 51 mL/min — ABNORMAL LOW (ref >90)
GFR calc non Af Amer: 44 mL/min — ABNORMAL LOW (ref >90)
Glucose, Bld: 304 mg/dL — ABNORMAL HIGH (ref 70–99)
Phosphorus: 4.7 mg/dL — ABNORMAL HIGH (ref 2.3–4.6)
Potassium: 5.1 mmol/L (ref 3.5–5.1)
Sodium: 130 mmol/L — ABNORMAL LOW (ref 135–145)

## 2014-12-14 LAB — GLUCOSE, CAPILLARY
GLUCOSE-CAPILLARY: 301 mg/dL — AB (ref 70–99)
Glucose-Capillary: 271 mg/dL — ABNORMAL HIGH (ref 70–99)
Glucose-Capillary: 292 mg/dL — ABNORMAL HIGH (ref 70–99)

## 2014-12-14 LAB — CULTURE, BLOOD (ROUTINE X 2)
Culture: NO GROWTH
Culture: NO GROWTH

## 2014-12-14 MED ORDER — INSULIN DETEMIR 100 UNIT/ML ~~LOC~~ SOLN: 77.0000 [IU] | Freq: Every day | SUBCUTANEOUS | Status: DC

## 2014-12-14 MED ORDER — INSULIN ASPART 100 UNIT/ML ~~LOC~~ SOLN: 13.0000 [IU] | Freq: Three times a day (TID) | SUBCUTANEOUS | Status: DC

## 2014-12-14 MED ORDER — HYDROCODONE-ACETAMINOPHEN 5-325 MG PO TABS: 1.0000 | ORAL_TABLET | ORAL | Status: DC | PRN

## 2014-12-14 MED ORDER — CLINDAMYCIN HCL 300 MG PO CAPS: 300.0000 mg | ORAL_CAPSULE | Freq: Four times a day (QID) | ORAL | Status: AC

## 2014-12-14 NOTE — Discharge Instructions (Signed)

## 2014-12-14 NOTE — Discharge Summary (Signed)
Name: [name] MRN: [id number] DOB: 01-11-1961 54 y.o. PCP: Nichole Creek, MD  Date of Admission: 12/07/2014  7:28 PM Date of Discharge: 12/14/2014 Attending Physician: Aletta Edouard, MD  Discharge Diagnosis: Principal Problem:   Cellulitis Active Problems:   Diabetes mellitus type 2, uncontrolled, with complications   TOBACCO ABUSE   HTN (hypertension)   Anemia of chronic disease   Hypothyroidism   Acute on chronic kidney failure   OSA on CPAP with oxygen   Diabetic foot ulcer  Discharge Medications:   Medication List    STOP taking these medications        BREO ELLIPTA 100-25 MCG/INH Aepb  Generic drug:  Fluticasone Furoate-Vilanterol     lisinopril 5 MG tablet  Commonly known as:  PRINIVIL,ZESTRIL      TAKE these medications        ADVAIR DISKUS 250-50 MCG/DOSE Aepb  Generic drug:  Fluticasone-Salmeterol  Inhale 1 puff into the lungs 2 (two) times daily.     amLODipine 5 MG tablet  Commonly known as:  NORVASC  Take 5 mg by mouth daily.     aspirin EC 81 MG tablet  Take 81 mg by mouth daily.     atorvastatin 80 MG tablet  Commonly known as:  LIPITOR  Take 80 mg by mouth daily.     clindamycin 300 MG capsule  Commonly known as:  CLEOCIN  Take 1 capsule (300 mg total) by mouth 4 (four) times daily.     FLUoxetine 20 MG tablet  Commonly known as:  PROZAC  Take 60 mg by mouth.     furosemide 40 MG tablet  Commonly known as:  LASIX  Take 40 mg by mouth daily.     gabapentin 300 MG capsule  Commonly known as:  NEURONTIN  Take 300 mg by mouth 3 (three) times daily.     HYDROcodone-acetaminophen 5-325 MG per tablet  Commonly known as:  NORCO/VICODIN  Take 1-2 tablets by mouth every 4 (four) hours as needed for moderate pain or severe pain.     ibuprofen 200 MG tablet  Commonly known as:  ADVIL,MOTRIN  Take 400 mg by mouth every 6 (six) hours as needed for mild pain.     insulin detemir 100 UNIT/ML injection  Commonly known as:  LEVEMIR   Inject 0.77 mLs (77 Units total) into the skin daily.     levothyroxine 25 MCG tablet  Commonly known as:  SYNTHROID, LEVOTHROID  Take 25 mcg by mouth every morning.     metoCLOPramide 10 MG tablet  Commonly known as:  REGLAN  Take 10 mg by mouth 4 (four) times daily.     NOVOLOG FLEXPEN 100 UNIT/ML FlexPen  Generic drug:  insulin aspart  Take 8-25 Units by mouth daily. Sliding scale     insulin aspart 100 UNIT/ML injection  Commonly known as:  novoLOG  Inject 13 Units into the skin 3 (three) times daily with meals.     omeprazole 20 MG capsule  Commonly known as:  PRILOSEC  Take 20 mg by mouth daily.     ondansetron 8 MG disintegrating tablet  Commonly known as:  ZOFRAN ODT  Take 1 tablet (8 mg total) by mouth every 8 (eight) hours as needed for nausea.     oxybutynin 5 MG tablet  Commonly known as:  DITROPAN  Take 5 mg by mouth every morning.     tiotropium 18 MCG inhalation capsule  Commonly known as:  SPIRIVA  Place 18 mcg into inhaler and inhale daily.        Disposition and follow-up:   [name] was discharged from [facility] in Stable condition.  At the hospital follow up visit please address:  1.  S/p Right BKA Abscess I&D on 4/22: Her planned end date for clindamycin is 01/01/2015. Patient has follow-up with Dr. Lajoyce Corners of orthopedic surgery.  DMII with complications: Would continue to trend the patient's blood glucose control given new regimen of diabetes medications.  Acute on chronic kidney disease: Would recheck patient's creatinine to ensure stability.   COPD: Confirm that patient is on only a single inhaled corticosteroid.  2.  Labs / imaging needed at time of follow-up: basic metabolic panel  3.  Pending labs/ test needing follow-up: none  Follow-up Appointments:     Follow-up Information    Follow up with Nichole Mustard, MD.   Specialty:  Orthopedic Surgery   Why:  Monday, May 2, 3:15 pm    Contact information:    [address] [name] [address] [phone]       Follow up with Nichole Creek, MD.   Specialty:  Internal Medicine   Why:  Dec 22, 2014;  10 AM   Contact information:   [address] [place] [address] [phone]       Discharge Instructions: Discharge Instructions    Call MD for:  difficulty breathing, headache or visual disturbances    Complete by:  As directed      Call MD for:  extreme fatigue    Complete by:  As directed      Call MD for:  hives    Complete by:  As directed      Call MD for:  persistant dizziness or light-headedness    Complete by:  As directed      Call MD for:  persistant nausea and vomiting    Complete by:  As directed      Call MD for:  redness, tenderness, or signs of infection (pain, swelling, redness, odor or green/yellow discharge around incision site)    Complete by:  As directed      Call MD for:  severe uncontrolled pain    Complete by:  As directed      Call MD for:  temperature >100.4    Complete by:  As directed      Diet - low sodium heart healthy    Complete by:  As directed      Increase activity slowly    Complete by:  As directed            Consultations:    Procedures Performed:  [place] Extrem Low Left Ltd  12/08/2014   CLINICAL DATA:  Left foot ulcer.  EXAM: ULTRASOUND left LOWER EXTREMITY LIMITED  TECHNIQUE: Ultrasound examination of the lower extremity soft tissues was performed in the area of clinical concern.  COMPARISON:  October 08, 2014.  FINDINGS: Sonographic evaluation of the medial aspect of the left foot along plantar surface demonstrates no fluid collection or abscess.  IMPRESSION: No definite abscess or fluid collection seen in the area of concern.   Electronically Signed   By: Lupita Raider, M.D.   On: 12/08/2014 15:54   [place] Extrem Low Right Ltd  12/10/2014   CLINICAL DATA:  Abscess of amputation stump.  EXAM: ULTRASOUND RIGHT LOWER EXTREMITY LIMITED  TECHNIQUE: Ultrasound examination  of the lower extremity soft tissues  was performed in the area of clinical concern.  COMPARISON:  Ultrasound 12/08/2014.  FINDINGS: Diffuse edema is present in the subcutaneous tissues of the RIGHT below-the-knee amputation stump. There is an area of focal phlegmon in the subcutaneous fat anteriorly that measures 25 mm x 7 mm x 26 mm. This has internal echo does without definite increased through transmission. The borders are irregular and this is more compatible with phlegmon than abscess.  IMPRESSION: No abscess identified. 25 mm x 7 mm x 26 mm focal area of phlegmon in the soft tissues.   Electronically Signed   By: Andreas Newport M.D.   On: 12/10/2014 10:12   Dg Knee Complete 4 Views Right  12/07/2014   CLINICAL DATA:  Right distal stump infection.  EXAM: RIGHT KNEE - COMPLETE 4+ VIEW  COMPARISON:  12/20/2011  FINDINGS: Below the knee amputation. Mild hypertrophic changes at the osteotomy with associated sclerosis at the margin. No destructive or resorptive change. No appreciable soft tissue gas. Enthesopathic changes at the patella.  IMPRESSION: Below the knee amputation with mild sclerosis at the osteotomy margin which may be reactive or reflect sequelae of prior infection. Recommend MRI if clinical concern for acute osteomyelitis persists.   Electronically Signed   By: Jearld Lesch M.D.   On: 12/07/2014 22:37   Dg Foot 2 Views Left  12/07/2014   CLINICAL DATA:  Soft tissues stump infection.  EXAM: LEFT FOOT - 2 VIEW  COMPARISON:  05/03/2011  FINDINGS: There has been previous amputation of the great toe at the metatarsal level, the second toe an the third toe at the phalangeal level. Soft tissues shows swelling and areas of lucency consistent with infection. There is chronic cortical thickening in the metatarsal region, most pronounced at the second. There is no finding specific for osteomyelitis presently. Chronic degenerative/ neuropathic changes remain evident in the midfoot.  IMPRESSION: No plain  radiographic finding specific for osteomyelitis.   Electronically Signed   By: Paulina Fusi M.D.   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 She reports developing redness and swelling at her right BKA stump over the last week making it difficult to wear her prosthesis. The redness has expanded and she has had increasing pain over this time. He also reports increased drainage from an ulcer on the bottom of her left foot. She has been following with Dr. Romualdo Bolk at [facility] regarding her ulcer, and he recently referred her to wound care who has been debriding the foot. Approximately a week ago, she also developed what initially appeared to be a pimple on her left lip. She tried to drain this without success, and she reports increasing swelling and pain of her left cheek, making it difficult for her to chew on the left side of her mouth. She reports having cold sores on her lips in the past. She has 2 additional abscesses in her left axilla, one of which spontaneously drained on its own. She also reports drainage from her eyes bilaterally for the past couple of months, and she says she has a stye in her right  eye. She reports diaphoresis and cold chills in addition to intermittent nausea and vomiting over the past few days. She reports vomiting 2 times yesterday, but she denies any change in her appetite or inability to tolerate food.  In the ER, her vital signs were stable. She was given 1 L of normal saline, and started on clindamycin IV for R  LE cellulitis.  Hospital Course by problem list: Principal Problem:   Cellulitis Active Problems:   Diabetes mellitus type 2, uncontrolled, with complications   TOBACCO ABUSE   HTN (hypertension)   Anemia of chronic disease   Hypothyroidism   Acute on chronic kidney failure   OSA on CPAP with oxygen   Diabetic foot ulcer   S/p Right BKA Abscess I&D on 4/22: Patient with a BKA along with left first through third toe amputations likely secondary to a combination of peripheral vascular disease and type 2 diabetes. In the past, several skin infections and abscesses have been drained. Patient initially presenting to this hospitalization with increased redness and erythema of her right BKA stump. Initial imaging showing cellulitis with possible abscess. There was no definitive evidence of osteomyelitis. Ultrasound of the stump showed underlying phlegmon. Patient was started on IV clindamycin and received irrigation and drainage from orthopedic surgery on 12/11/2014. Patient tolerated the procedure well and had no complications. Patient was continued on IV clindamycin for 72 hours after surgery and then was discharged on a total of 3 weeks of antibiotic oral therapy (clindamycin by mouth) from the time of surgery. Her planned end date is 01/01/2015. Physical therapy recommended that patient continue her recovery in a skilled nursing facility but patient declined to be transferred to a skilled nursing facility, stating that she wanted to go home. Patient did not provide any reasons for her decisions. Patient was discharged home with full home health services. Patient has follow-up with Dr. Lajoyce Corners of orthopedic surgery.  DMII with complications: Patient was initially on a regimen of Levemir 200 units daily at home along with NovoLog sliding scale insulin 8-25 units daily. This was felt to be an unbalanced regimen of long-acting versus short-acting insulin. There was also some concern that too high of a  long-acting insulin regimen would subject her to high risk of hypoglycemia especially in the perioperative period. By discharge, patient's regimen was reduced down to Levemir 77 units daily along with NovoLog 13 units 3 times a day with meals along with patient's previous sliding scale insulin. Otherwise, patient's gabapentin and the metoclopramide were continued. Would continue to trend the patient's blood glucose control given new regimen of diabetes medications.  Acute on chronic kidney disease: Patient had intermittent uptrend in her creatinine throughout her admission which were thought to be likely prerenal in etiology. Patient's creatinine had resolved with IV fluid repletion throughout her hospitalization. By the time of discharge, patient's creatinine had trended down to 1.34 to 1.7 the day prior to discharge. Would recheck patient's creatinine to ensure stability.   COPD: Patient with no acute issues as an inpatient. Patient was continued on Advair, Spiriva. Patient seems to be on Advair and Breo Ellipta concurrently upon admission. Her breo ellipta was discontinued upon discharge due to concern for redundant therapy. Confirm that patient is on only a single inhaled corticosteroid.  Diastolic heart failure: Patient with an echocardiogram from July 2015 with grade 2 diastolic dysfunction and normal ejection fraction. Patient's Lasix was discontinued in the setting of increased creatinine. Echocardiogram from July 2015 with grade 2 diastolic dysfunction, ejection  fraction previously normal. Patient's home Lasix dosage was resumed upon discharge given recovery of patient's creatinine.  Hypothyroidism: Patient's home Synthroid 25 g daily was continued.  Hypertension: Patient's pressures were moderately well controlled throughout her hospitalization. She was continued on her home amlodipine 5 mg daily. Her lisinopril discontinued in the setting of AKI and was not resumed given normal blood pressures  upon discharge. Would consider resuming patient's lisinopril at hospital follow-up.  Discharge Vitals:   BP 155/74 mmHg  Pulse 85  Temp(Src) 98.2 F (36.8 C) (Oral)  Resp 17  Ht 5\' 5"  (1.651 m)  Wt 233 lb 7.5 oz (105.9 kg)  BMI 38.85 kg/m2  SpO2 92%  Discharge Labs:  Results for orders placed or performed during the hospital encounter of 12/07/14 (from the past 24 hour(s))  Glucose, capillary     Status: Abnormal   Collection Time: 12/13/14  5:06 PM  Result Value Ref Range   Glucose-Capillary 229 (H) 70 - 99 mg/dL  Glucose, capillary     Status: Abnormal   Collection Time: 12/13/14  9:43 PM  Result Value Ref Range   Glucose-Capillary 263 (H) 70 - 99 mg/dL  Renal function panel     Status: Abnormal   Collection Time: 12/14/14  6:28 AM  Result Value Ref Range   Sodium 130 (L) 135 - 145 mmol/L   Potassium 5.1 3.5 - 5.1 mmol/L   Chloride 91 (L) 96 - 112 mmol/L   CO2 29 19 - 32 mmol/L   Glucose, Bld 304 (H) 70 - 99 mg/dL   BUN 40 (H) 6 - 23 mg/dL   Creatinine, Ser 3.34 (H) 0.50 - 1.10 mg/dL   Calcium 8.8 8.4 - 35.6 mg/dL   Phosphorus 4.7 (H) 2.3 - 4.6 mg/dL   Albumin 2.0 (L) 3.5 - 5.2 g/dL   GFR calc non Af Amer 44 (L) >90 mL/min   GFR calc Af Amer 51 (L) >90 mL/min   Anion gap 10 5 - 15  Glucose, capillary     Status: Abnormal   Collection Time: 12/14/14  7:24 AM  Result Value Ref Range   Glucose-Capillary 301 (H) 70 - 99 mg/dL  Glucose, capillary     Status: Abnormal   Collection Time: 12/14/14 11:34 AM  Result Value Ref Range   Glucose-Capillary 271 (H) 70 - 99 mg/dL    Signed: Harold Barban, MD 12/14/2014, 4:11 PM    Services Ordered on Discharge: Home health physical therapy, home health occupational therapy, home health nursing, home health aide Equipment Ordered on Discharge: none

## 2014-12-14 NOTE — Progress Notes (Addendum)
Subjective:  Patient states that she is feeling well. She states that she does not want to go to a skilled nursing facility to continue with her recovery. Upon repeated questioning, she does not provide reasons for why this is the case. She remains adamant in her decision. Otherwise, patient not reporting any complaints.  Objective: Vital signs in last 24 hours: Filed Vitals:   12/13/14 1143 12/13/14 1700 12/13/14 2100 12/14/14 0500  BP: 124/59 123/47 141/67 146/59  Pulse: 83 79 81 77  Temp: 97.9 F (36.6 C) 98.9 F (37.2 C) 98.5 F (36.9 C) 97.9 F (36.6 C)  TempSrc: Oral Oral Oral Oral  Resp: 17 17 18 18   Height:      Weight:   233 lb 7.5 oz (105.9 kg)   SpO2:  93% 94% 92%   Weight change: -2 lb 2.1 oz (-0.968 kg)  Intake/Output Summary (Last 24 hours) at 12/14/14 0735 Last data filed at 12/14/14 0500  Gross per 24 hour  Intake 1079.5 ml  Output   2250 ml  Net -1170.5 ml    Constitutional: She is oriented to person, place, and time and well-developed, well-nourished, and in no distress. No distress.  HENT:  Crusted lesion on left upper lip much improved over the interval  Eyes: Conjunctivae and EOM are normal. Bilateral eyes without discharge. No scleral icterus.  Neck: Normal range of motion. Neck supple.  Cardiovascular: Normal rate and regular rhythm. No murmurs, rubs or gallops Pulmonary/Chest: Effort normal and breath sounds normal. No respiratory distress. She has no wheezes.  Abdominal: Obese. Soft. Bowel sounds are normal. She exhibits no distension. There is no tenderness.  Musculoskeletal: R BKA stump is wrapped in ACE bandage. S/p right BKA and left first through third digit amputations, Penrose drain removed Neurological: She is alert and oriented to person, place, and time. No cranial nerve deficit. She exhibits normal muscle tone.   Lab Results: Basic Metabolic Panel:  Recent Labs Lab 12/12/14 0655 12/13/14 0505  NA 132* 132*  K 5.2* 4.4  CL 94*  92*  CO2 31 30  GLUCOSE 327* 289*  BUN 23 39*  CREATININE 1.17* 1.70*  CALCIUM 8.7 9.0  PHOS  --  5.7*   Liver Function Tests:  Recent Labs Lab 12/07/14 1809 12/13/14 0505  AST 20  --   ALT 18  --   ALKPHOS 121*  --   BILITOT 0.5  --   PROT 6.0  --   ALBUMIN 2.3* 1.8*   No results for input(s): LIPASE, AMYLASE in the last 168 hours. No results for input(s): AMMONIA in the last 168 hours. CBC:  Recent Labs Lab 12/07/14 1809  12/12/14 0655 12/13/14 0505  WBC 10.5  < > 9.8 8.9  NEUTROABS 7.7  --   --   --   HGB 11.4*  < > 10.0* 8.8*  HCT 34.2*  < > 31.1* 27.1*  MCV 84.7  < > 85.0 85.2  PLT 151  < > 239 231  < > = values in this interval not displayed.  CBG:  Recent Labs Lab 12/12/14 2139 12/13/14 0725 12/13/14 1143 12/13/14 1706 12/13/14 2143 12/14/14 0724  GLUCAP 223* 248* 208* 229* 263* 301*   Hemoglobin A1C:  Recent Labs Lab 12/08/14 1800  HGBA1C 10.9*   Coagulation: No results for input(s): LABPROT, INR in the last 168 hours. Anemia Panel: No results for input(s): VITAMINB12, FOLATE, FERRITIN, TIBC, IRON, RETICCTPCT in the last 168 hours. Urine Drug Screen: Drugs of Abuse  Component Value Date/Time   LABOPIA NONE DETECTED 04/21/2013 1531   COCAINSCRNUR NONE DETECTED 04/21/2013 1531   LABBENZ NONE DETECTED 04/21/2013 1531   AMPHETMU NONE DETECTED 04/21/2013 1531   THCU NONE DETECTED 04/21/2013 1531   LABBARB NONE DETECTED 04/21/2013 1531    Alcohol Level: No results for input(s): ETH in the last 168 hours. Urinalysis:  Recent Labs Lab 12/07/14 2125  COLORURINE YELLOW  LABSPEC 1.022  PHURINE 5.0  GLUCOSEU >1000*  HGBUR SMALL*  BILIRUBINUR NEGATIVE  KETONESUR NEGATIVE  PROTEINUR 100*  UROBILINOGEN 0.2  NITRITE POSITIVE*  LEUKOCYTESUR NEGATIVE    Micro Results: Recent Results (from the past 240 hour(s))  Blood culture (routine x 2)     Status: None (Preliminary result)   Collection Time: 12/07/14  8:49 PM  Result Value  Ref Range Status   Specimen Description BLOOD LEFT WRIST  Final   Special Requests BOTTLES DRAWN AEROBIC AND ANAEROBIC 3CC EA  Final   Culture   Final           BLOOD CULTURE RECEIVED NO GROWTH TO DATE CULTURE WILL BE HELD FOR 5 DAYS BEFORE ISSUING A FINAL NEGATIVE REPORT Performed at [facility]    Report Status PENDING  Incomplete  Blood culture (routine x 2)     Status: None (Preliminary result)   Collection Time: 12/07/14  8:50 PM  Result Value Ref Range Status   Specimen Description BLOOD RIGHT ANTECUBITAL  Final   Special Requests BOTTLES DRAWN AEROBIC AND ANAEROBIC 5CC EA  Final   Culture   Final           BLOOD CULTURE RECEIVED NO GROWTH TO DATE CULTURE WILL BE HELD FOR 5 DAYS BEFORE ISSUING A FINAL NEGATIVE REPORT Performed at [facility]    Report Status PENDING  Incomplete  Surgical pcr screen     Status: Abnormal   Collection Time: 12/11/14  5:00 AM  Result Value Ref Range Status   MRSA, PCR POSITIVE (A) NEGATIVE Final   Staphylococcus aureus POSITIVE (A) NEGATIVE Final    Comment:        The Xpert SA Assay (FDA approved for NASAL specimens in patients over 51 years of age), is one component of a comprehensive surveillance program.  Test performance has been validated by [facility] for patients greater than or equal to 28 year old. It is not intended to diagnose infection nor to guide or monitor treatment.    Studies/Results: No results found. Medications: I have reviewed the patient's current medications. Scheduled Meds: . amLODipine  5 mg Oral Daily  . antiseptic oral rinse  7 mL Mouth Rinse BID  . aspirin EC  81 mg Oral Daily  . atorvastatin  80 mg Oral Daily  . Chlorhexidine Gluconate Cloth  6 each Topical Q0600  . clindamycin (CLEOCIN) IV  600 mg Intravenous 3 times per day  . feeding supplement (GLUCERNA SHAKE)  237 mL Oral Q1500  . feeding supplement (PRO-STAT SUGAR FREE 64)  30 mL Oral BID  . FLUoxetine  60 mg Oral Daily  .  gabapentin  300 mg Oral TID  . heparin  5,000 Units Subcutaneous 3 times per day  . insulin aspart  0-20 Units Subcutaneous TID WC  . insulin aspart  0-5 Units Subcutaneous QHS  . insulin aspart  13 Units Subcutaneous TID WC  . insulin detemir  77 Units Subcutaneous Daily  . levothyroxine  25 mcg Oral QAC breakfast  . metoCLOPramide  10 mg Oral TID  AC & HS  . mometasone-formoterol  2 puff Inhalation BID  . mupirocin ointment  1 application Nasal BID  . pantoprazole  40 mg Oral Daily  . sodium chloride  3 mL Intravenous Q12H  . tiotropium  18 mcg Inhalation Daily   Continuous Infusions: . sodium chloride Stopped (12/13/14 1012)   PRN Meds:.acetaminophen **OR** acetaminophen, HYDROcodone-acetaminophen, HYDROmorphone (DILAUDID) injection, methocarbamol **OR** methocarbamol (ROBAXIN)  IV, oxyCODONE Assessment/Plan: Principal Problem:   Cellulitis Active Problems:   Diabetes mellitus type 2, uncontrolled, with complications   TOBACCO ABUSE   HTN (hypertension)   Anemia of chronic disease   Hypothyroidism   Acute on chronic kidney failure   OSA on CPAP with oxygen   Diabetic foot ulcer  S/p Right BKA Abscess I&D on 4/22: Patient is postop day 3 s/p I&D of her BKA cellulitis with underlying abscess. Penrose has been removed this morning. VSS.  Blood cx pending NGTD. Patient not agreeable to going to skilled nursing facility this morning. -Appreciate ortho mgmt -Plan for three weeks of oral clindamycin (end date 01/01/15).  -No wound cx pending  -Vicodin 5-325 mg q6h PRN -PT and OT recommending SNF (consulted CSW)   DMII with complications: At home, she takes Levemir 200 units daily at home along with NovoLog sliding scale 8-25 units daily. -resistant sliding scale insulin -Continue Levemir at 77 units -Continue mealtime and nighttime coverage (continue mealtime coverage to 13 units 3 times a day) -Continue home gabapentin 300 mg 3 times a day. -Continue home metoclopramide 10 mg 4  times daily. -Continue home oxybutynin 5 mg daily. -Continue statin   Acute on chronic kidney disease: Patient with creatinine down trending to 1.34 from 1.70 yesterday. Likely prerenal secondary to hypovolemia after surgery.  COPD -Continue home [facility]) twice a day. -Continue home Spiriva daily. -Supple without oxygen with goal saturation of 92%.  Diastolic heart failure Echocardiogram from July 2015 with grade 2 diastolic dysfunction, ejection fraction previously normal. -d/c lasix, SCr elevated   Hypothyroidism -Continue home Synthroid 25 g daily.  Hypertension: Pressures moderately well controlled. Likely exacerbated by pain.  -Continue home amlodipine 5 mg daily. -Holding home lisinopril in the setting recent AKI. May consider resuming given stabilization.  Dispo: Likely discharge today. -Follow up with wound care clinic -PT and OT recommending SNF -Lisinopril held for normotension and AKI for now. Consider resuming at OP f/u.  The patient does have a current PCP [name], MD), therefore will not be require OPC follow-up after discharge.   The patient does have transportation limitations that hinder transportation to clinic appointments.   LOS: 6 days    Harold Barban, MD 12/14/2014, 7:35 AM

## 2014-12-14 NOTE — Progress Notes (Signed)
Inpatient Diabetes Program Recommendations  AACE/ADA: New Consensus Statement on Inpatient Glycemic Control (2013)  Target Ranges:  Prepandial:   less than 140 mg/dL      Peak postprandial:   less than 180 mg/dL (1-2 hours)      Critically ill patients:  140 - 180 mg/dL   Results for [name], [name] (MRN [id number]) as of 12/14/2014 10:15  Ref. Range 12/13/2014 07:25 12/13/2014 11:43 12/13/2014 17:06 12/13/2014 21:43 12/14/2014 07:24  Glucose-Capillary Latest Ref Range: 70-99 mg/dL 325 (H) 498 (H) 264 (H) 263 (H) 301 (H)    Diabetes history: DM2 Outpatient Diabetes medications: Levemir 200 units daily, Novolog 8-25 units tid per SSI Current orders for Inpatient glycemic control: Levemir 77 units daily, Novolog 0-20 units TID with meals, Novolog 0-5 units HS, Novolog 13 units TID with meals  Inpatient Diabetes Program Recommendations Insulin - Basal: CBGs ranged from 208-263 mg/dl on 1/58 and fasting glucose was 301 mg/dl this morning. Please consider increasing Levemir to 100 units daily.  Thanks, Orlando Penner, RN, MSN, CCRN, CDE Diabetes Coordinator Inpatient Diabetes Program [phone] (Team Pager from 8am to 5pm) [phone] ([facility] office) [phone] [facility] office)

## 2014-12-14 NOTE — Progress Notes (Signed)
Patient ID: [name], female   DOB: 11-Jun-1961, 54 y.o.   MRN: [id number] Penrose drain was removed. Cellulitis is improving. Anticipate a discharge to home on antibiotics. Patient is safe for discharge from orthopedic standpoint.

## 2014-12-14 NOTE — Progress Notes (Signed)
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 She reports developing redness and swelling at her right BKA stump over the last week making it difficult to wear her prosthesis. Sores at axillae and plantar aspect of L foot    PT Comments    Lengthy discussion re: plans for dc, and the fact that pt is choosing home; Going home at wheelchair level is viable, the biggest obstacle will be her steps to enter as she cannot be fitted or wear a prosthesis until her BKA revision is healed; At this point, I recommend ambulance transport home; Pt tells me that she will use medical transport for getting to and from appts and that they have assisted her in the past; We did discuss some "out of the box" options fro getting up and down the steps -- for all of these ways she will need assistance;  She reports she has spoken with her prosthetist, Dorene Sorrow at [employer] about re-fitting her prosthesis and getting a custom shoe for the L foot  Follow Up Recommendations  Home health PT;Other (comment) (maximizing HH services)     Equipment Recommendations  Other (comment) (must consider ambulance transport home)    Recommendations for Other Services       Precautions / Restrictions Restrictions Weight Bearing Restrictions: Yes RLE Weight Bearing: Non weight bearing (residual limb)    Mobility  Bed Mobility Overal bed mobility: Modified Independent                 Transfers Overall transfer level: Needs assistance Equipment used: None Transfers: Sit to/from Stand;Stand Pivot Transfers Sit to Stand: Min guard Stand pivot transfers: Supervision       General transfer comment: Managing stand pivot transfers well, though needs to put weight through L foot; Stood to RW with minguard for safety; not needing physical assist; noted pt at times puts her Right knee (not end of  residual limb) on the bed for stability with transfers  Ambulation/Gait Ambulation/Gait assistance: Min guard (without physical contact) Ambulation Distance (Feet): 5 Feet Assistive device: Rolling walker (2 wheeled)       General Gait Details: Hop step on L foot with RW bed to recliner; not needing physical assist, but very taxing energetically   Stairs Stairs:  (Any options for negotiating the steps to enter her home require at least one person assist; we discussed "bumping" up in a WC with 2 person assist; setting a chair without armrests on a step and sitting back into it, then turning and stnading from it, "bumping" up on her bottom and having someone help her stand at the top)          Wheelchair Mobility    Modified Rankin (Stroke Patients Only)       Balance Overall balance assessment: Needs assistance             Standing balance comment: Definite need for UE support with standing activities; put her knee (not end of residual limb) on bed to stabilize while wiping post BSC use  Cognition Arousal/Alertness: Awake/alert Behavior During Therapy: WFL for tasks assessed/performed Overall Cognitive Status: Within Functional Limits for tasks assessed                      Exercises      General Comments        Pertinent Vitals/Pain Pain Assessment: No/denies pain Pain Intervention(s): Premedicated before session    Home Living                      Prior Function            PT Goals (current goals  can now be found in the care plan section) Acute Rehab PT Goals Patient Stated Goal: wants to get home PT Goal Formulation: With patient Time For Goal Achievement: 12/22/14 Potential to Achieve Goals: Good Progress towards PT goals: Progressing toward goals    Frequency  Min 3X/week    PT Plan Discharge plan needs to be updated    Co-evaluation             End of Session   Activity Tolerance: Patient tolerated treatment well Patient left: in chair;with call bell/phone within reach     Time: 2202-5427 PT Time Calculation (min) (ACUTE ONLY): 30 min  Charges:  $Gait Training: 8-22 mins $Therapeutic Activity: 8-22 mins                    G Codes:      Olen Pel 12/14/2014, 1:08 PM  Van Clines, [place]  Acute Rehabilitation Services Pager [phone] Office [phone]

## 2014-12-14 NOTE — Progress Notes (Signed)
OT Cancellation Note  Patient Details Name: [name] MRN: [id number] DOB: 03/16/1961   Cancelled Treatment:    Reason Eval/Treat Not Completed: Other (comment). Pt reports that she does not want to go anywhere but home. Currently she is asking me to come back to see her due to her meds she got earlier are making her very sleepy. Will try back as schedule allows.  Evette Georges [phone] 12/14/2014, 10:31 AM

## 2014-12-14 NOTE — Care Management Note (Addendum)
CARE MANAGEMENT NOTE 12/14/2014  Patient:  [name], [name]   Account Number:  [id number]  Date Initiated:  12/08/2014  Documentation initiated by:  Jaton Eilers  Subjective/Objective Assessment:   CM following for progression and d/c planning.     Action/Plan:   Noted referral for assist in checking CBG and adm Insulin. This is not available on [name] daily basis, unless pt applies obtains PCS services and they are able to assist. Otherwise SNF or ALF may be needed.   Anticipated DC Date:  12/14/2014   Anticipated DC Plan:  [place]         Choice offered to / List presented to:  C-1 Patient        [place] arranged  HH-1 RN  [place] agency  [place]   Status of service:  Completed, signed off Medicare Important Message given?  YES (If response is "NO", the following Medicare IM given date fields will be blank) Date Medicare IM given:  12/11/2014 Medicare IM given by:  Ren Aspinall Date Additional Medicare IM given:  12/14/2014 Additional Medicare IM given by:  [facility]  Discharge Disposition:  [place]  Per UR Regulation:    If discussed at Long Length of Stay Meetings, dates discussed:    Comments: 12/14/2014 This CM informed by [facility] that they can not begin [facility] services before Demetrius Charity, December 17, 2014, this CM informed pt and encouraged pt to selected another [facility] agency, however pt refused. Pt was given phone number for [facility] RN to call for questions or concerns before they are able to see her in the home.  Sharmon Revere RN coordinator with [facility] provided her number and stated that she would triage any problems that the pt may have before they are able to come to the home. This information given to the pt and written on d/c instuctions large enough and dark enough for the pt to read back to this CM. CRoyal RN MPH, case manager, [phone]    12/14/14 Noted pt plan to d/c to home with  [facility] services, she selected [facility] as she has used that agency previously. Info sent to [employer]. CRoyal RN MPH, case manager [phone]  12/11/2014 Met with pt re [place] needs and plan to arrange as much support from [facility] as possible at the time of d/c is pt is able to return to home, may require short term rehab. Plan for surgery today, will continue to follow post op for d/c needs.  CRoyal RN MPH, case manager, [phone]

## 2014-12-14 NOTE — Progress Notes (Signed)
  PROGRESS NOTE MEDICINE TEACHING ATTENDING   Day 6 of stay Patient name: [name]   Medical record number: [id number] Date of birth: 1960/09/05   [name] does not have any complaints. Her lip and cheek swelling is gone. There is no pain. Her stump cellulitis area has been debrided and is wrapped in bandages.  Blood pressure 155/74, pulse 85, temperature 98.2 F (36.8 C), temperature source Oral, resp. rate 17, height 5\' 5"  (1.651 m), weight 233 lb 7.5 oz (105.9 kg), SpO2 92 %. The patient is alert and oriented, comfortable, in no acute distress. PERRL, EOMI. Face swelling and erythema resolved. Heart exhibits regular rate and rhythm, no murmurs. Lungs are clear to auscultation. Abdomen is soft and non-tender. There is right BKA, stump in bandages, and good pedal pulses. There are no gross focal neurological deficits apparent.   Assessment/Plan She can be discharged home on antibiotics.  Diabetes management discussed with Dr . Patient will follow up with her PCP Dr [name] in [facility].  Patient refuses to go to SNF. She will transferred home with services.   I have discussed the care of this patient with my IM team residents. Please see the resident note for details.  [facility] 12/14/2014, 3:53 PM.

## 2014-12-14 NOTE — Progress Notes (Signed)
Discharge paper and d/c instruction ,prescription for vicodin given to patient.Patient to pick up prescription for clindamycin [facility].Patient verbalized understanding. Dung Prien, Drinda Butts, Charity fundraiser

## 2014-12-28 ENCOUNTER — Ambulatory Visit (INDEPENDENT_AMBULATORY_CARE_PROVIDER_SITE_OTHER): Payer: Medicare HMO | Admitting: *Deleted

## 2014-12-28 DIAGNOSIS — R001 Bradycardia, unspecified: Secondary | ICD-10-CM | POA: Diagnosis not present

## 2014-12-28 NOTE — Progress Notes (Signed)
Remote pacemaker transmission.   

## 2014-12-31 LAB — CUP PACEART REMOTE DEVICE CHECK
Brady Statistic RV Percent Paced: 0 %
Lead Channel Impedance Value: 702 Ohm
Lead Channel Setting Pacing Amplitude: 2 V
Lead Channel Setting Pacing Amplitude: 2.4 V
Lead Channel Setting Pacing Pulse Width: 0.4 ms
MDC IDC MSMT LEADCHNL RA IMPEDANCE VALUE: 449 Ohm
MDC IDC MSMT LEADCHNL RA SENSING INTR AMPL: 5.2 mV
MDC IDC MSMT LEADCHNL RV SENSING INTR AMPL: 8.9 mV
MDC IDC PG SERIAL: 68266686
MDC IDC SESS DTM: 20160512112318
MDC IDC STAT BRADY RA PERCENT PACED: 42 %
Pulse Gen Model: 394969

## 2015-01-11 ENCOUNTER — Encounter: Payer: Self-pay | Admitting: Cardiology

## 2015-01-14 ENCOUNTER — Encounter: Payer: Self-pay | Admitting: Internal Medicine

## 2015-04-16 ENCOUNTER — Encounter: Payer: Self-pay | Admitting: *Deleted

## 2015-05-19 ENCOUNTER — Encounter: Payer: Self-pay | Admitting: *Deleted

## 2015-06-22 ENCOUNTER — Encounter: Payer: Self-pay | Admitting: *Deleted

## 2015-06-29 ENCOUNTER — Telehealth: Payer: Self-pay | Admitting: Internal Medicine

## 2015-06-29 NOTE — Telephone Encounter (Signed)
New Message   Pt is calling she states that she needs to come in and see someone about her holter monitor

## 2015-06-29 NOTE — Telephone Encounter (Signed)
I called and spoke with the patient- she states that she has a transmitter at home and it is working fine, but she does not sleep in the same room with it. Last remote was 12/2014. She is slightly overdue for follow up with Dr. Graciela Husbands and is complaining of some fatigue issues. Appt made for 11/18 with Dr. Graciela Husbands.

## 2015-07-09 ENCOUNTER — Ambulatory Visit (INDEPENDENT_AMBULATORY_CARE_PROVIDER_SITE_OTHER): Payer: Medicare HMO | Admitting: Internal Medicine

## 2015-07-09 ENCOUNTER — Encounter: Payer: Self-pay | Admitting: Internal Medicine

## 2015-07-09 VITALS — BP 110/62 | HR 61 | Ht 65.0 in | Wt 218.0 lb

## 2015-07-09 DIAGNOSIS — R001 Bradycardia, unspecified: Secondary | ICD-10-CM

## 2015-07-09 LAB — CUP PACEART INCLINIC DEVICE CHECK
Brady Statistic RA Percent Paced: 49 %
Date Time Interrogation Session: 20161118154903
Implantable Lead Implant Date: 20150716
Implantable Lead Location: 753859
Implantable Lead Model: 5076
Lead Channel Impedance Value: 390 Ohm
Lead Channel Pacing Threshold Amplitude: 0.8 V
Lead Channel Setting Pacing Amplitude: 2 V
Lead Channel Setting Pacing Amplitude: 2.4 V
Lead Channel Setting Pacing Pulse Width: 0.4 ms
MDC IDC LEAD IMPLANT DT: 20150716
MDC IDC LEAD LOCATION: 753860
MDC IDC MSMT LEADCHNL RA PACING THRESHOLD PULSEWIDTH: 0.4 ms
MDC IDC MSMT LEADCHNL RV IMPEDANCE VALUE: 624 Ohm
MDC IDC MSMT LEADCHNL RV PACING THRESHOLD AMPLITUDE: 0.8 V
MDC IDC MSMT LEADCHNL RV PACING THRESHOLD PULSEWIDTH: 0.4 ms
MDC IDC MSMT LEADCHNL RV SENSING INTR AMPL: 9.4 mV
MDC IDC STAT BRADY RV PERCENT PACED: 1 %
Pulse Gen Model: 394929
Pulse Gen Serial Number: 68266686

## 2015-07-09 NOTE — Patient Instructions (Signed)
Medication Instructions: - no changes  Labwork: - none  Procedures/Testing: - none  Follow-Up: - Remote monitoring is used to monitor your Pacemaker of ICD from home. This monitoring reduces the number of office visits required to check your device to one time per year. It allows us to keep an eye on the functioning of your device to ensure it is working properly. You are scheduled for a device check from home on 10/11/15. You may send your transmission at any time that day. If you have a wireless device, the transmission will be sent automatically. After your physician reviews your transmission, you will receive a postcard with your next transmission date.  - Your physician wants you to follow-up in: 1 year with Amber Seiler, NP for Dr. Klein. You will receive a reminder letter in the mail two months in advance. If you don't receive a letter, please call our office to schedule the follow-up appointment.  Any Additional Special Instructions Will Be Listed Below (If Applicable).   

## 2015-07-09 NOTE — Progress Notes (Signed)
Patient Care Team: [name], MD as PCP - General (Internal Medicine)   HPI  [name] is a 54 y.o. female Seen following pacemaker implantation for sinus node dysfunction 7/15 ; echocardiogram 9/14 demonstrated normal LV function without evidence of HCM  S/pR BKA  Uses prosthesis   She has shortness of breath with minimal effort.  She also has lightheadedness with prolonged walking. She denies shower intolerance. She has some orthostatic intolerance at home.  Hemoglobin A1c most recently was in the 9 range    Past Medical History  Diagnosis Date  . Diabetic neuropathy (HCC)   . Cellulitis   . Thyroid disease   . Hyperlipidemia   . Neuromuscular disorder (HCC)   . Concussion as a teenager    slight  . Diarrhea     pt takes Reglan tid  . Hypothyroidism     takes Synthroid daily  . Depression     takes CYmbalta daily  . MRSA (methicillin resistant staph aureus) culture positive     hx of 2012  . Staph infection 2007  . Complication of anesthesia 01/2013    "didn't know where I was; who I was; talking out the top of my head" (05/03/2013)  . Hypertension   . Peripheral vascular disease (HCC)   . CHF (congestive heart failure) (HCC)   . Pneumonia 1980's    "hospitalized w/it" (03-May-2013)  . Chronic bronchitis (HCC)   . Orthopnea     "just one; yesterday morning" (2013/05/03)  . Type II diabetes mellitus (HCC)     Novolog,Levemir,and Lovaza daily  . GERD (gastroesophageal reflux disease)   . Daily headache   . Rheumatoid arthritis (HCC)     "both knees" (05/03/13)  . Anxiety   . Kidney stone     "passed on before" (May 03, 2013)  . Non-healing wound of lower extremity     lt foot  . OSA on CPAP     and oxygen  . COPD (chronic obstructive pulmonary disease) (HCC)     wear oxygen at home  . Symptomatic bradycardia 03/04/2014    Past Surgical History  Procedure Laterality Date  . Amputation    . Leg amputation below knee Right 2012  . Toe  amputation Left ?2011     only 2 toes remaning.   . Tubal ligation  1990's  . Foot surgery Right 2012    "put pins in one year; amputated toes another OR" (05-03-2013)  . Wrist surgery Right 1980's    "pinched nerve" (May 03, 2013)  . Dilation and curettage of uterus  1980's  . Refractive surgery Bilateral 2014  . Incision and drainage abscess N/A 01/24/2013    Procedure: INCISION AND DRAINAGE ABSCESS;  Surgeon: Clovis Pu. Cornett, MD;  Location: [facility] OR;  Service: General;  Laterality: N/A;  . Inguinal hernia repair N/A 01/28/2013    Procedure: I&D Right Groin Wound;  Surgeon: Shelly Rubenstein, MD;  Location: [facility] OR;  Service: General;  Laterality: N/A;  . Ganglion cyst excision Left 1990's    "wrist" (2013/05/03)  . Bladder surgery  1970's    "stretched the mouth of my bladder" (05-03-2013)  . I&d extremity Left 08/25/2013    Procedure: IRRIGATION AND DEBRIDEMENT EXTREMITY;  Surgeon: Axel Filler, MD;  Location: [facility] OR;  Service: General;  Laterality: Left;  . Pacemaker insertion  03/05/14    Biotronik dual chamber pacemaker implanted by Dr Graciela Husbands for symptomatic bradycardia  . Permanent pacemaker insertion N/A 03/05/2014  Procedure: PERMANENT PACEMAKER INSERTION;  Surgeon: Duke Salvia, MD;  Location: [facility] CATH LAB;  Service: Cardiovascular;  Laterality: N/A;  . Stump revision Right 12/11/2014    Procedure: Right Below Knee Amputation Revision;  Surgeon: Nadara Mustard, MD;  Location[facility] OR;  Service: Orthopedics;  Laterality: Right;    Current Outpatient Prescriptions  Medication Sig Dispense Refill  . aspirin EC 81 MG tablet Take 81 mg by mouth daily.    Marland Kitchen atorvastatin (LIPITOR) 80 MG tablet Take 80 mg by mouth daily.    . furosemide (LASIX) 40 MG tablet Take 40 mg by mouth daily.  2  . gabapentin (NEURONTIN) 300 MG capsule Take 300 mg by mouth 3 (three) times daily.  11  . insulin aspart (NOVOLOG) 100 UNIT/ML injection Inject 13 Units into the skin 3 (three) times daily with meals. 10 mL 0  .  insulin detemir (LEVEMIR) 100 UNIT/ML injection Inject 0.77 mLs (77 Units total) into the skin daily. 10 mL 11  . levothyroxine (SYNTHROID, LEVOTHROID) 25 MCG tablet Take 25 mcg by mouth every morning.     Marland Kitchen NOVOLOG FLEXPEN 100 UNIT/ML FlexPen Take 8-25 Units by mouth daily. Sliding scale  6  . omeprazole (PRILOSEC) 20 MG capsule Take 20 mg by mouth daily.    . ondansetron (ZOFRAN ODT) 8 MG disintegrating tablet Take 1 tablet (8 mg total) by mouth every 8 (eight) hours as needed for nausea. 20 tablet 0  . oxybutynin (DITROPAN) 5 MG tablet Take 5 mg by mouth every morning.       No current facility-administered medications for this visit.    Allergies  Allergen Reactions  . Erythromycin Diarrhea  . Iodine-131 Nausea And Vomiting  . Gadolinium Nausea And Vomiting     Code: VOM, Desc: Pt began vomiting immed post infusion of multihance, Onset Date: [id number]   . Ivp Dye [Iodinated Diagnostic Agents] Nausea And Vomiting    Nausea and vomiting   . Metformin Diarrhea     diarrhea  . Penicillins Hives    Hives     Review of Systems negative except from HPI and PMH  Physical Exam BP 110/62 mmHg  Pulse 61  Ht 5\' 5"  (1.651 m)  Wt 218 lb (98.884 kg)  BMI 36.28 kg/m2 Well developed and well nourished in no acute distress room cells of cigarette smoke HENT normal E scleral and icterus clear Neck Supple JVP flat; carotids brisk and full Clear to ausculation Device pocket well healed; without hematoma or erythema.  There is no tethering Regular rate and rhythm, no murmurs gallops or rub Soft with active bowel sounds No clubbing cyanosis  prothesis  Alert and oriented, grossly normal motor and sensory function Skin Warm and Dry  ECG REVIEWED FROM 4/16 sinus rhythm at 79 Intervals 15/10/42  Rightward axis Assessment and  Plan  Sinus node dysfunction   Pacemaker  Syncope   Hypotension-orthostatic  Cigarette abuse   She struggled with shortness of breath. Device function is  normal. There is no significant evidence of volume overload although she was examined in the seated position. Previous LV function was normal.  We have discussed the physiology of orthostatic intolerance and I recommended the use of an abdominal binder.  We have also discussed the importance of exercise and smoke with exercise strategy.  She is encouraged to stop smoking. We discussed patches abstinence

## 2015-09-30 ENCOUNTER — Emergency Department (HOSPITAL_COMMUNITY)
Admission: EM | Admit: 2015-09-30 | Discharge: 2015-10-01 | Disposition: A | Discharge status: Home / Self Care | Payer: Medicare HMO | Attending: Emergency Medicine | Admitting: Emergency Medicine

## 2015-09-30 ENCOUNTER — Encounter (HOSPITAL_COMMUNITY): Payer: Self-pay | Admitting: Emergency Medicine

## 2015-09-30 DIAGNOSIS — J449 Chronic obstructive pulmonary disease, unspecified: Secondary | ICD-10-CM | POA: Diagnosis not present

## 2015-09-30 DIAGNOSIS — K219 Gastro-esophageal reflux disease without esophagitis: Secondary | ICD-10-CM | POA: Diagnosis not present

## 2015-09-30 DIAGNOSIS — Z7982 Long term (current) use of aspirin: Secondary | ICD-10-CM | POA: Diagnosis not present

## 2015-09-30 DIAGNOSIS — Z87442 Personal history of urinary calculi: Secondary | ICD-10-CM | POA: Diagnosis not present

## 2015-09-30 DIAGNOSIS — W2203XA Walked into furniture, initial encounter: Secondary | ICD-10-CM | POA: Diagnosis not present

## 2015-09-30 DIAGNOSIS — F1110 Opioid abuse, uncomplicated: Secondary | ICD-10-CM | POA: Diagnosis not present

## 2015-09-30 DIAGNOSIS — Z794 Long term (current) use of insulin: Secondary | ICD-10-CM | POA: Insufficient documentation

## 2015-09-30 DIAGNOSIS — G4733 Obstructive sleep apnea (adult) (pediatric): Secondary | ICD-10-CM | POA: Insufficient documentation

## 2015-09-30 DIAGNOSIS — Z23 Encounter for immunization: Secondary | ICD-10-CM | POA: Insufficient documentation

## 2015-09-30 DIAGNOSIS — Z8701 Personal history of pneumonia (recurrent): Secondary | ICD-10-CM | POA: Insufficient documentation

## 2015-09-30 DIAGNOSIS — F17210 Nicotine dependence, cigarettes, uncomplicated: Secondary | ICD-10-CM | POA: Diagnosis not present

## 2015-09-30 DIAGNOSIS — Z8614 Personal history of Methicillin resistant Staphylococcus aureus infection: Secondary | ICD-10-CM | POA: Insufficient documentation

## 2015-09-30 DIAGNOSIS — I10 Essential (primary) hypertension: Secondary | ICD-10-CM | POA: Insufficient documentation

## 2015-09-30 DIAGNOSIS — Z9981 Dependence on supplemental oxygen: Secondary | ICD-10-CM | POA: Insufficient documentation

## 2015-09-30 DIAGNOSIS — Y998 Other external cause status: Secondary | ICD-10-CM | POA: Diagnosis not present

## 2015-09-30 DIAGNOSIS — S01119A Laceration without foreign body of unspecified eyelid and periocular area, initial encounter: Secondary | ICD-10-CM | POA: Insufficient documentation

## 2015-09-30 DIAGNOSIS — N390 Urinary tract infection, site not specified: Secondary | ICD-10-CM | POA: Diagnosis not present

## 2015-09-30 DIAGNOSIS — F419 Anxiety disorder, unspecified: Secondary | ICD-10-CM | POA: Diagnosis not present

## 2015-09-30 DIAGNOSIS — E039 Hypothyroidism, unspecified: Secondary | ICD-10-CM | POA: Diagnosis not present

## 2015-09-30 DIAGNOSIS — Y9289 Other specified places as the place of occurrence of the external cause: Secondary | ICD-10-CM | POA: Insufficient documentation

## 2015-09-30 DIAGNOSIS — M069 Rheumatoid arthritis, unspecified: Secondary | ICD-10-CM | POA: Insufficient documentation

## 2015-09-30 DIAGNOSIS — F329 Major depressive disorder, single episode, unspecified: Secondary | ICD-10-CM | POA: Insufficient documentation

## 2015-09-30 DIAGNOSIS — Z872 Personal history of diseases of the skin and subcutaneous tissue: Secondary | ICD-10-CM | POA: Insufficient documentation

## 2015-09-30 DIAGNOSIS — Z880 Allergy status to penicillin: Secondary | ICD-10-CM | POA: Diagnosis not present

## 2015-09-30 DIAGNOSIS — I509 Heart failure, unspecified: Secondary | ICD-10-CM | POA: Insufficient documentation

## 2015-09-30 DIAGNOSIS — Y9301 Activity, walking, marching and hiking: Secondary | ICD-10-CM | POA: Diagnosis not present

## 2015-09-30 DIAGNOSIS — R55 Syncope and collapse: Secondary | ICD-10-CM

## 2015-09-30 DIAGNOSIS — E1140 Type 2 diabetes mellitus with diabetic neuropathy, unspecified: Secondary | ICD-10-CM | POA: Insufficient documentation

## 2015-09-30 MED ORDER — TETANUS-DIPHTH-ACELL PERTUSSIS 5-2.5-18.5 LF-MCG/0.5 IM SUSP
0.5000 mL | Freq: Once | INTRAMUSCULAR | Status: AC
  Administered 2015-10-01: 0.5 mL via INTRAMUSCULAR
  Filled 2015-09-30: qty 0.5

## 2015-09-30 NOTE — ED Notes (Signed)
The patient said she fainted and does not remember what happened.  According to EMS, the patient fell and hit the corner of her dresser and she got a laceration to under the left eye.  The patient was negative for Orthostatics.  She rates her pain 3/10.

## 2015-09-30 NOTE — ED Provider Notes (Signed)
CSN: [id number]     Arrival date & time 09/30/15  2320 History  By signing my name below, I, Freida Busman, attest that this documentation has been prepared under the direction and in the presence of Tomasita Crumble, MD . Electronically Signed: Freida Busman, Scribe. 10/01/2015. 12:19 AM.    Chief Complaint  Patient presents with  . Loss of Consciousness    The patient said she fainted and does not remember what happened.  According to EMS, the patient fell and hit the corner of her dresser and she got a laceration to under the left eye.     The history is provided by the patient. No language interpreter was used.     HPI Comments:  [name] is a 55 y.o. female who presents to the Emergency Department s/p syncopal episode and fall this evening. Pt states she walked into her room felt dizzy and nauseous, lost consciousness and fell. She denies CP, SOB, and abdominal pain prior to fall. At this time pt is complaining of a small laceration under her left eye following the incident with mild pain to the site. She struck her face on a dresser when she fell.  She also denies fever, and vomiting. No alleviating factors noted.   Past Medical History  Diagnosis Date  . Diabetic neuropathy (HCC)   . Cellulitis   . Thyroid disease   . Hyperlipidemia   . Neuromuscular disorder (HCC)   . Concussion as a teenager    slight  . Diarrhea     pt takes Reglan tid  . Hypothyroidism     takes Synthroid daily  . Depression     takes CYmbalta daily  . MRSA (methicillin resistant staph aureus) culture positive     hx of 2012  . Staph infection 2007  . Complication of anesthesia 01/2013    "didn't know where I was; who I was; talking out the top of my head" (May 01, 2013)  . Hypertension   . Peripheral vascular disease (HCC)   . CHF (congestive heart failure) (HCC)   . Pneumonia 1980's    "hospitalized w/it" (05-01-13)  . Chronic bronchitis (HCC)   . Orthopnea     "just one; yesterday morning"  (05/01/2013)  . Type II diabetes mellitus (HCC)     Novolog,Levemir,and Lovaza daily  . GERD (gastroesophageal reflux disease)   . Daily headache   . Rheumatoid arthritis (HCC)     "both knees" (05-01-2013)  . Anxiety   . Kidney stone     "passed on before" (05/01/2013)  . Non-healing wound of lower extremity     lt foot  . OSA on CPAP     and oxygen  . COPD (chronic obstructive pulmonary disease) (HCC)     wear oxygen at home  . Symptomatic bradycardia 03/04/2014   Past Surgical History  Procedure Laterality Date  . Amputation    . Leg amputation below knee Right 2012  . Toe amputation Left ?2011     only 2 toes remaning.   . Tubal ligation  1990's  . Foot surgery Right 2012    "put pins in one year; amputated toes another OR" (01-May-2013)  . Wrist surgery Right 1980's    "pinched nerve" (05/01/13)  . Dilation and curettage of uterus  1980's  . Refractive surgery Bilateral 2014  . Incision and drainage abscess N/A 01/24/2013    Procedure: INCISION AND DRAINAGE ABSCESS;  Surgeon: Clovis Pu. Cornett, MD;  Location: [facility] OR;  Service: General;  Laterality: N/A;  . Inguinal hernia repair N/A 01/28/2013    Procedure: I&D Right Groin Wound;  Surgeon: Shelly Rubenstein, MD;  Location: [facility] OR;  Service: General;  Laterality: N/A;  . Ganglion cyst excision Left 1990's    "wrist" (04/21/2013)  . Bladder surgery  1970's    "stretched the mouth of my bladder" (04/21/2013)  . I&d extremity Left 08/25/2013    Procedure: IRRIGATION AND DEBRIDEMENT EXTREMITY;  Surgeon: Axel Filler, MD;  Location: [facility] OR;  Service: General;  Laterality: Left;  . Pacemaker insertion  03/05/14    Biotronik dual chamber pacemaker implanted by Dr Graciela Husbands for symptomatic bradycardia  . Permanent pacemaker insertion N/A 03/05/2014    Procedure: PERMANENT PACEMAKER INSERTION;  Surgeon: Duke Salvia, MD;  Location: [facility] CATH LAB;  Service: Cardiovascular;  Laterality: N/A;  . Stump revision Right 12/11/2014    Procedure: Right Below  Knee Amputation Revision;  Surgeon: Nadara Mustard, MD;  Location: [facility] OR;  Service: Orthopedics;  Laterality: Right;   Family History  Problem Relation Age of Onset  . Hypertension Mother   . Hyperlipidemia Mother   . Hypertrophic cardiomyopathy Mother     has pacer  . Hypertension Brother   . Hyperlipidemia Brother   . Cancer Maternal Aunt   . Diabetes Paternal Aunt   . Diabetes Paternal Uncle   . Diabetes Paternal Grandmother   . Anesthesia problems Neg Hx   . Hypotension Neg Hx   . Malignant hyperthermia Neg Hx   . Pseudochol deficiency Neg Hx   . Alzheimer's disease Father    Social History  Substance Use Topics  . Smoking status: Current Every Day Smoker -- 1.00 packs/day for 33 years    Types: Cigarettes  . Smokeless tobacco: Never Used  . Alcohol Use: No   OB History    No data available     Review of Systems  10 systems reviewed and all are negative for acute change except as noted in the HPI.  Allergies  Erythromycin; Iodine-131; Gadolinium; Ivp dye; Metformin; and Penicillins  Home Medications   Prior to Admission medications   Medication Sig Start Date End Date Taking? Authorizing Provider  insulin aspart (NOVOLOG) 100 UNIT/ML injection Inject 13 Units into the skin 3 (three) times daily with meals. Patient taking differently: Inject 8-28 Units into the skin 4 (four) times daily. Sliding scale 12/14/14  Yes Harold Barban, MD  insulin detemir (LEVEMIR) 100 UNIT/ML injection Inject 0.77 mLs (77 Units total) into the skin daily. Patient taking differently: Inject 90 Units into the skin daily.  12/14/14  Yes Harold Barban, MD  aspirin EC 81 MG tablet Take 81 mg by mouth daily.    Historical Provider, MD  atorvastatin (LIPITOR) 80 MG tablet Take 80 mg by mouth daily.    Historical Provider, MD  furosemide (LASIX) 40 MG tablet Take 40 mg by mouth daily. 03/29/14   Historical Provider, MD  gabapentin (NEURONTIN) 300 MG capsule Take 300 mg by mouth 3 (three) times daily.  06/12/14   Historical Provider, MD  levothyroxine (SYNTHROID, LEVOTHROID) 25 MCG tablet Take 25 mcg by mouth every morning.     Historical Provider, MD  NOVOLOG FLEXPEN 100 UNIT/ML FlexPen Take 8-25 Units by mouth daily. Sliding scale 05/29/14   Historical Provider, MD  omeprazole (PRILOSEC) 20 MG capsule Take 20 mg by mouth daily.    Historical Provider, MD  ondansetron (ZOFRAN ODT) 8 MG disintegrating tablet Take 1 tablet (8 mg total) by mouth every  8 (eight) hours as needed for nausea. 09/15/14   Derwood Kaplan, MD  oxybutynin (DITROPAN) 5 MG tablet Take 5 mg by mouth every morning.      Historical Provider, MD   BP 156/85 mmHg  Pulse 86  Temp(Src) 98.1 F (36.7 C) (Oral)  Resp 15  SpO2 95% Physical Exam  Constitutional: She is oriented to person, place, and time. She appears well-developed and well-nourished. No distress.  HENT:  Head: Normocephalic and atraumatic.  Nose: Nose normal.  Mouth/Throat: Oropharynx is clear and moist. No oropharyngeal exudate.  Eyes: Conjunctivae and EOM are normal. Pupils are equal, round, and reactive to light. No scleral icterus.  Neck: Normal range of motion. Neck supple. No JVD present. No tracheal deviation present. No thyromegaly present.  Cardiovascular: Normal rate, regular rhythm and normal heart sounds.  Exam reveals no gallop and no friction rub.   No murmur heard. Pulmonary/Chest: Effort normal and breath sounds normal. No respiratory distress. She has no wheezes. She exhibits no tenderness.  Abdominal: Soft. Bowel sounds are normal. She exhibits no distension and no mass. There is no tenderness. There is no rebound and no guarding.  Musculoskeletal: Normal range of motion. She exhibits no edema or tenderness.  Lymphadenopathy:    She has no cervical adenopathy.  Neurological: She is alert and oriented to person, place, and time. No cranial nerve deficit. She exhibits normal muscle tone.  S/P right BKA Normal strength and sensation to all  extremities Normal cerebellar testing  Skin: Skin is warm and dry. No rash noted. No erythema. No pallor.  Half cm abrasion to left check with TTP of left maxillary bone  Nursing note and vitals reviewed.   ED Course  Procedures   DIAGNOSTIC STUDIES:  Oxygen Saturation is 94% on RA, adequate by my interpretation.    COORDINATION OF CARE:  12:00 AM Discussed treatment plan with pt at bedside and pt agreed to plan.  Labs Review Labs Reviewed  BASIC METABOLIC PANEL - Abnormal; Notable for the following:    Sodium 134 (*)    Chloride 97 (*)    Glucose, Bld 251 (*)    BUN 21 (*)    Creatinine, Ser 1.30 (*)    GFR calc non Af Amer 46 (*)    GFR calc Af Amer 53 (*)    All other components within normal limits  CBC WITH DIFFERENTIAL/PLATELET - Abnormal; Notable for the following:    WBC 13.9 (*)    RBC 3.29 (*)    Hemoglobin 9.5 (*)    HCT 27.1 (*)    Neutro Abs 8.7 (*)    All other components within normal limits  I-STAT CG4 LACTIC ACID, ED - Abnormal; Notable for the following:    Lactic Acid, Venous 2.85 (*)    All other components within normal limits  URINE CULTURE  MAGNESIUM  ETHANOL  URINALYSIS, ROUTINE W REFLEX MICROSCOPIC (NOT AT [facility])  URINE RAPID DRUG SCREEN, HOSP PERFORMED  I-STAT TROPOININ, ED  CBG MONITORING, ED    Imaging Review Ct Head Wo Contrast  10/01/2015  CLINICAL DATA:  55 year old female with fall and laceration to the left cheek. EXAM: CT HEAD WITHOUT CONTRAST CT MAXILLOFACIAL WITHOUT CONTRAST TECHNIQUE: Multidetector CT imaging of the head and maxillofacial structures were performed using the standard protocol without intravenous contrast. Multiplanar CT image reconstructions of the maxillofacial structures were also generated. COMPARISON:  Head CT dated 10/21/2013 FINDINGS: CT HEAD FINDINGS There is mild prominence of the ventricles and sulci compatible  with age-related atrophy. Mild periventricular and deep white matter hypodensities compatible  with chronic microvascular ischemic changes. No acute intracranial hemorrhage. No mass effect or midline shift. The visualized paranasal sinuses and mastoid air cells are well aerated. The calvarium is intact. Left periorbital skin laceration with small pockets of gas noted. CT MAXILLOFACIAL FINDINGS There is no acute facial bone fracture. The maxilla, mandible, and pterygoid plates are intact. The globes, retro-orbital fat, and orbital walls are preserved. There is small laceration of the left preorbital soft tissue with small pockets of air. No large hematoma identified. The visualized paranasal sinuses are clear. IMPRESSION: No acute intracranial hemorrhage. No acute facial bone fractures. Electronically Signed   By: Elgie Collard M.D.   On: 10/01/2015 00:53   Ct Maxillofacial Wo Cm  10/01/2015  CLINICAL DATA:  55 year old female with fall and laceration to the left cheek. EXAM: CT HEAD WITHOUT CONTRAST CT MAXILLOFACIAL WITHOUT CONTRAST TECHNIQUE: Multidetector CT imaging of the head and maxillofacial structures were performed using the standard protocol without intravenous contrast. Multiplanar CT image reconstructions of the maxillofacial structures were also generated. COMPARISON:  Head CT dated 10/21/2013 FINDINGS: CT HEAD FINDINGS There is mild prominence of the ventricles and sulci compatible with age-related atrophy. Mild periventricular and deep white matter hypodensities compatible with chronic microvascular ischemic changes. No acute intracranial hemorrhage. No mass effect or midline shift. The visualized paranasal sinuses and mastoid air cells are well aerated. The calvarium is intact. Left periorbital skin laceration with small pockets of gas noted. CT MAXILLOFACIAL FINDINGS There is no acute facial bone fracture. The maxilla, mandible, and pterygoid plates are intact. The globes, retro-orbital fat, and orbital walls are preserved. There is small laceration of the left preorbital soft tissue  with small pockets of air. No large hematoma identified. The visualized paranasal sinuses are clear. IMPRESSION: No acute intracranial hemorrhage. No acute facial bone fractures. Electronically Signed   By: Elgie Collard M.D.   On: 10/01/2015 00:53   I have personally reviewed and evaluated these images and lab results as part of my medical decision-making.   EKG Interpretation  Date/Time:  Thursday September 30 2015 23:30:19 EST Ventricular Rate:  73 PR Interval:  161 QRS Duration: 94 QT Interval:  445 QTC Calculation: 490 R Axis:   -76 Text Interpretation:  Sinus rhythm Left anterior fascicular block Abnormal  R-wave progression, late transition LVH with secondary repolarization  abnormality Borderline prolonged QT interval Baseline wander in lead(s) V6  No significant change since last tracing Confirmed by Erroll Luna [phone]) on 09/30/2015 11:52:48 PM      MDM   Final diagnoses:  None     Patient presents to the ED for syncopal episode at home.  Unsure if it was true syncope.  She states she got dizzy, fell over, and woke up when her head hit the table.  She states she remembers the entire event.  Her tetanus was updated for small lac on face.  CT scan is pending.  She was given IVF and morphine for pain control.    Upon repeat evaluation, patient states her pain is better.  CT scan is negative for injury.  She was advised to fu with PCP within 3 days to evaluate for her possible syncope.  No recurrence of her symptoms in the ED .  She appears well and in NAD.  VS remain within her normal limits and she is safe for Dc.  I personally performed the services described in this documentation, which  was scribed in my presence. The recorded information has been reviewed and is accurate.     Tomasita Crumble, MD 10/01/15 [phone]

## 2015-10-01 ENCOUNTER — Emergency Department (HOSPITAL_COMMUNITY): Payer: Medicare HMO

## 2015-10-01 DIAGNOSIS — R55 Syncope and collapse: Secondary | ICD-10-CM | POA: Diagnosis not present

## 2015-10-01 LAB — URINE MICROSCOPIC-ADD ON

## 2015-10-01 LAB — BASIC METABOLIC PANEL
ANION GAP: 13 (ref 5–15)
BUN: 21 mg/dL — ABNORMAL HIGH (ref 6–20)
CO2: 24 mmol/L (ref 22–32)
Calcium: 9.2 mg/dL (ref 8.9–10.3)
Chloride: 97 mmol/L — ABNORMAL LOW (ref 101–111)
Creatinine, Ser: 1.3 mg/dL — ABNORMAL HIGH (ref 0.44–1.00)
GFR calc Af Amer: 53 mL/min — ABNORMAL LOW (ref >60)
GFR calc non Af Amer: 46 mL/min — ABNORMAL LOW (ref >60)
GLUCOSE: 251 mg/dL — AB (ref 65–99)
POTASSIUM: 4.5 mmol/L (ref 3.5–5.1)
Sodium: 134 mmol/L — ABNORMAL LOW (ref 135–145)

## 2015-10-01 LAB — CBC WITH DIFFERENTIAL/PLATELET
BASOS ABS: 0 10*3/uL (ref 0.0–0.1)
Basophils Relative: 0 %
Eosinophils Absolute: 0.1 10*3/uL (ref 0.0–0.7)
Eosinophils Relative: 1 %
HEMATOCRIT: 27.1 % — AB (ref 36.0–46.0)
Hemoglobin: 9.5 g/dL — ABNORMAL LOW (ref 12.0–15.0)
LYMPHS ABS: 4 10*3/uL (ref 0.7–4.0)
LYMPHS PCT: 29 %
MCH: 28.9 pg (ref 26.0–34.0)
MCHC: 35.1 g/dL (ref 30.0–36.0)
MCV: 82.4 fL (ref 78.0–100.0)
MONO ABS: 1 10*3/uL (ref 0.1–1.0)
MONOS PCT: 7 %
NEUTROS ABS: 8.7 10*3/uL — AB (ref 1.7–7.7)
Neutrophils Relative %: 63 %
Platelets: 277 10*3/uL (ref 150–400)
RBC: 3.29 MIL/uL — ABNORMAL LOW (ref 3.87–5.11)
RDW: 13.7 % (ref 11.5–15.5)
WBC: 13.9 10*3/uL — ABNORMAL HIGH (ref 4.0–10.5)

## 2015-10-01 LAB — URINALYSIS, ROUTINE W REFLEX MICROSCOPIC
Bilirubin Urine: NEGATIVE
GLUCOSE, UA: 250 mg/dL — AB
Ketones, ur: NEGATIVE mg/dL
Nitrite: POSITIVE — AB
Protein, ur: >300 mg/dL — AB
SPECIFIC GRAVITY, URINE: 1.018 (ref 1.005–1.030)
pH: 5 (ref 5.0–8.0)

## 2015-10-01 LAB — ETHANOL

## 2015-10-01 LAB — RAPID URINE DRUG SCREEN, HOSP PERFORMED
AMPHETAMINES: NOT DETECTED
BENZODIAZEPINES: NOT DETECTED
Barbiturates: NOT DETECTED
COCAINE: NOT DETECTED
OPIATES: POSITIVE — AB
TETRAHYDROCANNABINOL: NOT DETECTED

## 2015-10-01 LAB — I-STAT CG4 LACTIC ACID, ED: Lactic Acid, Venous: 2.85 mmol/L (ref 0.5–2.0)

## 2015-10-01 LAB — MAGNESIUM: Magnesium: 2 mg/dL (ref 1.7–2.4)

## 2015-10-01 LAB — I-STAT TROPONIN, ED: Troponin i, poc: 0.01 ng/mL (ref 0.00–0.08)

## 2015-10-01 MED ORDER — METOCLOPRAMIDE HCL 10 MG PO TABS
10.0000 mg | ORAL_TABLET | Freq: Once | ORAL | Status: AC
  Administered 2015-10-01: 10 mg via ORAL
  Filled 2015-10-01: qty 1

## 2015-10-01 MED ORDER — MORPHINE SULFATE (PF) 4 MG/ML IV SOLN
4.0000 mg | Freq: Once | INTRAVENOUS | Status: AC
  Administered 2015-10-01: 4 mg via INTRAVENOUS
  Filled 2015-10-01: qty 1

## 2015-10-01 MED ORDER — NITROFURANTOIN MONOHYD MACRO 100 MG PO CAPS
100.0000 mg | ORAL_CAPSULE | Freq: Once | ORAL | Status: DC
  Filled 2015-10-01: qty 1

## 2015-10-01 MED ORDER — NITROFURANTOIN MONOHYD MACRO 100 MG PO CAPS: 100.0000 mg | ORAL_CAPSULE | Freq: Two times a day (BID) | ORAL | Status: DC

## 2015-10-01 MED ORDER — SODIUM CHLORIDE 0.9 % IV BOLUS (SEPSIS)
1000.0000 mL | Freq: Once | INTRAVENOUS | Status: AC
  Administered 2015-10-01: 1000 mL via INTRAVENOUS

## 2015-10-01 NOTE — Discharge Instructions (Signed)
Syncope Ms. Cullimore, your work up today was normal and your CT scan did not show any further injuries.  Take antibiotics for your urine infection.  You need to see your primary care doctor within 3 days for close follow up.  If symptoms worsen, come back to the ED immediately.  Thank you. Syncope means a person passes out (faints). The person usually wakes up in less than 5 minutes. It is important to seek medical care for syncope. HOME CARE  Have someone stay with you until you feel normal.  Do not drive, use machines, or play sports until your doctor says it is okay.  Keep all doctor visits as told.  Lie down when you feel like you might pass out. Take deep breaths. Wait until you feel normal before standing up.  Drink enough fluids to keep your pee (urine) clear or pale yellow.  If you take blood pressure or heart medicine, get up slowly. Take several minutes to sit and then stand. GET HELP RIGHT AWAY IF:   You have a severe headache.  You have pain in the chest, belly (abdomen), or back.  You are bleeding from the mouth or butt (rectum).  You have black or tarry poop (stool).  You have an irregular or very fast heartbeat.  You have pain with breathing.  You keep passing out, or you have shaking (seizures) when you pass out.  You pass out when sitting or lying down.  You feel confused.  You have trouble walking.  You have severe weakness.  You have vision problems. If you fainted, call for help (911 in [place] Do not drive yourself to the hospital.   This information is not intended to replace advice given to you by your health care provider. Make sure you discuss any questions you have with your health care provider.   Document Released: 01/24/2008 Document Revised: 12/22/2014 Document Reviewed: 10/06/2011 Elsevier Interactive Patient Education 2016 Elsevier Inc. Urinary Tract Infection A urinary tract infection (UTI) can occur any place along the urinary tract.  The tract includes the kidneys, ureters, bladder, and urethra. A type of germ called bacteria often causes a UTI. UTIs are often helped with antibiotic medicine.  HOME CARE   If given, take antibiotics as told by your doctor. Finish them even if you start to feel better.  Drink enough fluids to keep your pee (urine) clear or pale yellow.  Avoid tea, drinks with caffeine, and bubbly (carbonated) drinks.  Pee often. Avoid holding your pee in for a long time.  Pee before and after having sex (intercourse).  Wipe from front to back after you poop (bowel movement) if you are a woman. Use each tissue only once. GET HELP RIGHT AWAY IF:   You have back pain.  You have lower belly (abdominal) pain.  You have chills.  You feel sick to your stomach (nauseous).  You throw up (vomit).  Your burning or discomfort with peeing does not go away.  You have a fever.  Your symptoms are not better in 3 days. MAKE SURE YOU:   Understand these instructions.  Will watch your condition.  Will get help right away if you are not doing well or get worse.   This information is not intended to replace advice given to you by your health care provider. Make sure you discuss any questions you have with your health care provider.   Document Released: 01/24/2008 Document Revised: 08/28/2014 Document Reviewed: 03/07/2012 Elsevier Interactive Patient Education 2016 Elsevier  Inc. ° °

## 2015-10-03 LAB — URINE CULTURE: Culture: >=100000

## 2015-10-04 ENCOUNTER — Telehealth (HOSPITAL_COMMUNITY): Payer: Self-pay

## 2015-10-04 NOTE — Telephone Encounter (Signed)
Post ED Visit - Positive Culture Follow-up  Culture report reviewed by antimicrobial stewardship pharmacist:  []  , Pharm.D. []  Enzo Bi, Pharm.D., BCPS []  , Pharm.D. [x]  Celedonio Miyamoto, Pharm.D., BCPS []  [place], Garvin Fila.D., BCPS, AAHIVP []  , Pharm.D., BCPS, AAHIVP []  Georgina Pillion, Pharm.D. []  , [place].D.  Positive urine culture, >/= 100,000 colonies -> E Coli Treated with Nitrofurantoin, organism sensitive to the same and no further patient follow-up is required at this time.  [place] 10/04/2015, 9:15 PM

## 2015-10-11 ENCOUNTER — Ambulatory Visit (INDEPENDENT_AMBULATORY_CARE_PROVIDER_SITE_OTHER): Payer: Medicare HMO | Admitting: *Deleted

## 2015-10-11 DIAGNOSIS — R001 Bradycardia, unspecified: Secondary | ICD-10-CM

## 2015-10-11 NOTE — Progress Notes (Signed)
Remote pacemaker transmission.   

## 2015-10-13 ENCOUNTER — Inpatient Hospital Stay (HOSPITAL_COMMUNITY)
Admission: EM | Admit: 2015-10-13 | Discharge: 2015-10-25 | DRG: 637 | Disposition: A | Discharge status: Home Health | Payer: Medicare HMO | Attending: Internal Medicine | Admitting: Internal Medicine

## 2015-10-13 ENCOUNTER — Inpatient Hospital Stay (HOSPITAL_COMMUNITY): Payer: Medicare HMO

## 2015-10-13 ENCOUNTER — Encounter (HOSPITAL_COMMUNITY): Payer: Self-pay | Admitting: Emergency Medicine

## 2015-10-13 DIAGNOSIS — I5030 Unspecified diastolic (congestive) heart failure: Secondary | ICD-10-CM | POA: Diagnosis not present

## 2015-10-13 DIAGNOSIS — D696 Thrombocytopenia, unspecified: Secondary | ICD-10-CM | POA: Diagnosis not present

## 2015-10-13 DIAGNOSIS — Z978 Presence of other specified devices: Secondary | ICD-10-CM

## 2015-10-13 DIAGNOSIS — D649 Anemia, unspecified: Secondary | ICD-10-CM | POA: Diagnosis present

## 2015-10-13 DIAGNOSIS — Z794 Long term (current) use of insulin: Secondary | ICD-10-CM

## 2015-10-13 DIAGNOSIS — M069 Rheumatoid arthritis, unspecified: Secondary | ICD-10-CM | POA: Diagnosis present

## 2015-10-13 DIAGNOSIS — Z89511 Acquired absence of right leg below knee: Secondary | ICD-10-CM | POA: Diagnosis not present

## 2015-10-13 DIAGNOSIS — R443 Hallucinations, unspecified: Secondary | ICD-10-CM | POA: Diagnosis present

## 2015-10-13 DIAGNOSIS — L97529 Non-pressure chronic ulcer of other part of left foot with unspecified severity: Secondary | ICD-10-CM | POA: Diagnosis present

## 2015-10-13 DIAGNOSIS — J9601 Acute respiratory failure with hypoxia: Secondary | ICD-10-CM | POA: Diagnosis present

## 2015-10-13 DIAGNOSIS — Z7982 Long term (current) use of aspirin: Secondary | ICD-10-CM | POA: Diagnosis not present

## 2015-10-13 DIAGNOSIS — I5032 Chronic diastolic (congestive) heart failure: Secondary | ICD-10-CM

## 2015-10-13 DIAGNOSIS — E0800 Diabetes mellitus due to underlying condition with hyperosmolarity without nonketotic hyperglycemic-hyperosmolar coma (NKHHC): Secondary | ICD-10-CM | POA: Diagnosis present

## 2015-10-13 DIAGNOSIS — R440 Auditory hallucinations: Secondary | ICD-10-CM | POA: Diagnosis present

## 2015-10-13 DIAGNOSIS — E039 Hypothyroidism, unspecified: Secondary | ICD-10-CM | POA: Diagnosis present

## 2015-10-13 DIAGNOSIS — R001 Bradycardia, unspecified: Secondary | ICD-10-CM | POA: Diagnosis not present

## 2015-10-13 DIAGNOSIS — E872 Acidosis, unspecified: Secondary | ICD-10-CM | POA: Insufficient documentation

## 2015-10-13 DIAGNOSIS — Z888 Allergy status to other drugs, medicaments and biological substances status: Secondary | ICD-10-CM | POA: Diagnosis not present

## 2015-10-13 DIAGNOSIS — I10 Essential (primary) hypertension: Secondary | ICD-10-CM

## 2015-10-13 DIAGNOSIS — K219 Gastro-esophageal reflux disease without esophagitis: Secondary | ICD-10-CM | POA: Diagnosis present

## 2015-10-13 DIAGNOSIS — Z880 Allergy status to penicillin: Secondary | ICD-10-CM

## 2015-10-13 DIAGNOSIS — F329 Major depressive disorder, single episode, unspecified: Secondary | ICD-10-CM | POA: Diagnosis present

## 2015-10-13 DIAGNOSIS — E1140 Type 2 diabetes mellitus with diabetic neuropathy, unspecified: Secondary | ICD-10-CM | POA: Diagnosis present

## 2015-10-13 DIAGNOSIS — B370 Candidal stomatitis: Secondary | ICD-10-CM | POA: Diagnosis not present

## 2015-10-13 DIAGNOSIS — G4733 Obstructive sleep apnea (adult) (pediatric): Secondary | ICD-10-CM | POA: Diagnosis present

## 2015-10-13 DIAGNOSIS — Z9119 Patient's noncompliance with other medical treatment and regimen: Secondary | ICD-10-CM

## 2015-10-13 DIAGNOSIS — Z881 Allergy status to other antibiotic agents status: Secondary | ICD-10-CM | POA: Diagnosis not present

## 2015-10-13 DIAGNOSIS — E785 Hyperlipidemia, unspecified: Secondary | ICD-10-CM | POA: Diagnosis present

## 2015-10-13 DIAGNOSIS — Z950 Presence of cardiac pacemaker: Secondary | ICD-10-CM | POA: Diagnosis not present

## 2015-10-13 DIAGNOSIS — E1100 Type 2 diabetes mellitus with hyperosmolarity without nonketotic hyperglycemic-hyperosmolar coma (NKHHC): Principal | ICD-10-CM | POA: Diagnosis present

## 2015-10-13 DIAGNOSIS — E860 Dehydration: Secondary | ICD-10-CM | POA: Diagnosis present

## 2015-10-13 DIAGNOSIS — F17210 Nicotine dependence, cigarettes, uncomplicated: Secondary | ICD-10-CM | POA: Diagnosis present

## 2015-10-13 DIAGNOSIS — E1151 Type 2 diabetes mellitus with diabetic peripheral angiopathy without gangrene: Secondary | ICD-10-CM | POA: Diagnosis present

## 2015-10-13 DIAGNOSIS — K567 Ileus, unspecified: Secondary | ICD-10-CM

## 2015-10-13 DIAGNOSIS — B009 Herpesviral infection, unspecified: Secondary | ICD-10-CM | POA: Diagnosis present

## 2015-10-13 DIAGNOSIS — Z9989 Dependence on other enabling machines and devices: Secondary | ICD-10-CM

## 2015-10-13 DIAGNOSIS — R739 Hyperglycemia, unspecified: Secondary | ICD-10-CM | POA: Diagnosis present

## 2015-10-13 DIAGNOSIS — R1310 Dysphagia, unspecified: Secondary | ICD-10-CM | POA: Diagnosis present

## 2015-10-13 DIAGNOSIS — E11610 Type 2 diabetes mellitus with diabetic neuropathic arthropathy: Secondary | ICD-10-CM | POA: Insufficient documentation

## 2015-10-13 DIAGNOSIS — J449 Chronic obstructive pulmonary disease, unspecified: Secondary | ICD-10-CM | POA: Diagnosis present

## 2015-10-13 DIAGNOSIS — E11319 Type 2 diabetes mellitus with unspecified diabetic retinopathy without macular edema: Secondary | ICD-10-CM | POA: Diagnosis present

## 2015-10-13 DIAGNOSIS — I472 Ventricular tachycardia: Secondary | ICD-10-CM | POA: Diagnosis not present

## 2015-10-13 DIAGNOSIS — Z9981 Dependence on supplemental oxygen: Secondary | ICD-10-CM

## 2015-10-13 DIAGNOSIS — Z79899 Other long term (current) drug therapy: Secondary | ICD-10-CM | POA: Diagnosis not present

## 2015-10-13 DIAGNOSIS — G9341 Metabolic encephalopathy: Secondary | ICD-10-CM | POA: Diagnosis present

## 2015-10-13 DIAGNOSIS — R410 Disorientation, unspecified: Secondary | ICD-10-CM | POA: Diagnosis not present

## 2015-10-13 DIAGNOSIS — R21 Rash and other nonspecific skin eruption: Secondary | ICD-10-CM | POA: Diagnosis not present

## 2015-10-13 DIAGNOSIS — B001 Herpesviral vesicular dermatitis: Secondary | ICD-10-CM | POA: Insufficient documentation

## 2015-10-13 DIAGNOSIS — R4182 Altered mental status, unspecified: Secondary | ICD-10-CM | POA: Insufficient documentation

## 2015-10-13 DIAGNOSIS — Z01818 Encounter for other preprocedural examination: Secondary | ICD-10-CM

## 2015-10-13 DIAGNOSIS — N1832 Chronic kidney disease, stage 3b: Secondary | ICD-10-CM | POA: Diagnosis present

## 2015-10-13 DIAGNOSIS — Z87820 Personal history of traumatic brain injury: Secondary | ICD-10-CM | POA: Diagnosis not present

## 2015-10-13 DIAGNOSIS — N179 Acute kidney failure, unspecified: Secondary | ICD-10-CM | POA: Diagnosis present

## 2015-10-13 DIAGNOSIS — F419 Anxiety disorder, unspecified: Secondary | ICD-10-CM | POA: Diagnosis present

## 2015-10-13 DIAGNOSIS — E871 Hypo-osmolality and hyponatremia: Secondary | ICD-10-CM | POA: Diagnosis present

## 2015-10-13 DIAGNOSIS — N182 Chronic kidney disease, stage 2 (mild): Secondary | ICD-10-CM | POA: Diagnosis present

## 2015-10-13 DIAGNOSIS — N183 Chronic kidney disease, stage 3 unspecified: Secondary | ICD-10-CM | POA: Diagnosis present

## 2015-10-13 DIAGNOSIS — I130 Hypertensive heart and chronic kidney disease with heart failure and stage 1 through stage 4 chronic kidney disease, or unspecified chronic kidney disease: Secondary | ICD-10-CM | POA: Diagnosis present

## 2015-10-13 DIAGNOSIS — Z4659 Encounter for fitting and adjustment of other gastrointestinal appliance and device: Secondary | ICD-10-CM

## 2015-10-13 DIAGNOSIS — E1122 Type 2 diabetes mellitus with diabetic chronic kidney disease: Secondary | ICD-10-CM | POA: Diagnosis present

## 2015-10-13 DIAGNOSIS — Z91041 Radiographic dye allergy status: Secondary | ICD-10-CM

## 2015-10-13 DIAGNOSIS — G9340 Encephalopathy, unspecified: Secondary | ICD-10-CM | POA: Diagnosis present

## 2015-10-13 LAB — BASIC METABOLIC PANEL
ANION GAP: 18 — AB (ref 5–15)
Anion gap: 15 (ref 5–15)
BUN: 22 mg/dL — AB (ref 6–20)
BUN: 18 mg/dL (ref 6–20)
CALCIUM: 8.9 mg/dL (ref 8.9–10.3)
CALCIUM: 8.8 mg/dL — AB (ref 8.9–10.3)
CHLORIDE: 100 mmol/L — AB (ref 101–111)
CO2: 20 mmol/L — AB (ref 22–32)
CO2: 18 mmol/L — AB (ref 22–32)
CREATININE: 1.48 mg/dL — AB (ref 0.44–1.00)
Chloride: 93 mmol/L — ABNORMAL LOW (ref 101–111)
Creatinine, Ser: 1.26 mg/dL — ABNORMAL HIGH (ref 0.44–1.00)
GFR calc Af Amer: 45 mL/min — ABNORMAL LOW (ref >60)
GFR calc non Af Amer: 39 mL/min — ABNORMAL LOW (ref >60)
GFR calc non Af Amer: 47 mL/min — ABNORMAL LOW (ref >60)
GFR, EST AFRICAN AMERICAN: 55 mL/min — AB (ref >60)
GLUCOSE: 939 mg/dL — AB (ref 65–99)
Glucose, Bld: 461 mg/dL — ABNORMAL HIGH (ref 65–99)
Potassium: 5 mmol/L (ref 3.5–5.1)
Potassium: 4.3 mmol/L (ref 3.5–5.1)
Sodium: 128 mmol/L — ABNORMAL LOW (ref 135–145)
Sodium: 136 mmol/L (ref 135–145)

## 2015-10-13 LAB — URINALYSIS, ROUTINE W REFLEX MICROSCOPIC
BILIRUBIN URINE: NEGATIVE
Glucose, UA: >1000 mg/dL — AB
Ketones, ur: NEGATIVE mg/dL
Leukocytes, UA: NEGATIVE
NITRITE: NEGATIVE
PROTEIN: 100 mg/dL — AB
SPECIFIC GRAVITY, URINE: 1.028 (ref 1.005–1.030)
pH: 5.5 (ref 5.0–8.0)

## 2015-10-13 LAB — CBG MONITORING, ED
GLUCOSE-CAPILLARY: 357 mg/dL — AB (ref 65–99)
GLUCOSE-CAPILLARY: 415 mg/dL — AB (ref 65–99)
GLUCOSE-CAPILLARY: 320 mg/dL — AB (ref 65–99)
GLUCOSE-CAPILLARY: 237 mg/dL — AB (ref 65–99)
Glucose-Capillary: 330 mg/dL — ABNORMAL HIGH (ref 65–99)
Glucose-Capillary: 328 mg/dL — ABNORMAL HIGH (ref 65–99)
Glucose-Capillary: 362 mg/dL — ABNORMAL HIGH (ref 65–99)
Glucose-Capillary: 448 mg/dL — ABNORMAL HIGH (ref 65–99)

## 2015-10-13 LAB — I-STAT TROPONIN, ED: TROPONIN I, POC: 0.17 ng/mL — AB (ref 0.00–0.08)

## 2015-10-13 LAB — I-STAT CG4 LACTIC ACID, ED
Lactic Acid, Venous: 5.17 mmol/L (ref 0.5–2.0)
Lactic Acid, Venous: 5.88 mmol/L (ref 0.5–2.0)

## 2015-10-13 LAB — GLUCOSE, CAPILLARY: GLUCOSE-CAPILLARY: 325 mg/dL — AB (ref 65–99)

## 2015-10-13 LAB — RAPID URINE DRUG SCREEN, HOSP PERFORMED
AMPHETAMINES: NOT DETECTED
BENZODIAZEPINES: NOT DETECTED
Barbiturates: NOT DETECTED
COCAINE: NOT DETECTED
OPIATES: NOT DETECTED
Tetrahydrocannabinol: NOT DETECTED

## 2015-10-13 LAB — I-STAT ARTERIAL BLOOD GAS, ED
Acid-base deficit: 1 mmol/L (ref 0.0–2.0)
Acid-base deficit: 6 mmol/L — ABNORMAL HIGH (ref 0.0–2.0)
Acid-base deficit: 5 mmol/L — ABNORMAL HIGH (ref 0.0–2.0)
BICARBONATE: 26.2 meq/L — AB (ref 20.0–24.0)
BICARBONATE: 22.2 meq/L (ref 20.0–24.0)
Bicarbonate: 23.8 mEq/L (ref 20.0–24.0)
O2 SAT: 100 %
O2 Saturation: 95 %
O2 Saturation: 84 %
PCO2 ART: 52.1 mmHg — AB (ref 35.0–45.0)
PCO2 ART: 51.1 mmHg — AB (ref 35.0–45.0)
PCO2 ART: 59.3 mmHg — AB (ref 35.0–45.0)
PH ART: 7.212 — AB (ref 7.350–7.450)
PO2 ART: 81 mmHg (ref 80.0–100.0)
PO2 ART: 57 mmHg — AB (ref 80.0–100.0)
TCO2: 28 mmol/L (ref 0–100)
TCO2: 24 mmol/L (ref 0–100)
TCO2: 26 mmol/L (ref 0–100)
pH, Arterial: 7.31 — ABNORMAL LOW (ref 7.350–7.450)
pH, Arterial: 7.246 — ABNORMAL LOW (ref 7.350–7.450)
pO2, Arterial: 328 mmHg — ABNORMAL HIGH (ref 80.0–100.0)

## 2015-10-13 LAB — LACTATE DEHYDROGENASE: LDH: 317 U/L — ABNORMAL HIGH (ref 98–192)

## 2015-10-13 LAB — URINE MICROSCOPIC-ADD ON

## 2015-10-13 LAB — CBC
HCT: 42 % (ref 36.0–46.0)
HEMOGLOBIN: 13.9 g/dL (ref 12.0–15.0)
MCH: 27.6 pg (ref 26.0–34.0)
MCHC: 33.1 g/dL (ref 30.0–36.0)
MCV: 83.5 fL (ref 78.0–100.0)
PLATELETS: 171 10*3/uL (ref 150–400)
RBC: 5.03 MIL/uL (ref 3.87–5.11)
RDW: 13.8 % (ref 11.5–15.5)
WBC: 6.3 10*3/uL (ref 4.0–10.5)

## 2015-10-13 LAB — LACTIC ACID, PLASMA
LACTIC ACID, VENOUS: 1.9 mmol/L (ref 0.5–2.0)
LACTIC ACID, VENOUS: 4.2 mmol/L — AB (ref 0.5–2.0)
Lactic Acid, Venous: 4.7 mmol/L (ref 0.5–2.0)

## 2015-10-13 LAB — PROCALCITONIN: Procalcitonin: <0.1 ng/mL

## 2015-10-13 LAB — HEMOGLOBIN AND HEMATOCRIT, BLOOD
HEMATOCRIT: 38.8 % (ref 36.0–46.0)
HEMOGLOBIN: 13 g/dL (ref 12.0–15.0)

## 2015-10-13 LAB — TSH: TSH: 3.621 u[IU]/mL (ref 0.350–4.500)

## 2015-10-13 LAB — TROPONIN I: Troponin I: 4.15 ng/mL (ref <0.031)

## 2015-10-13 MED ORDER — SODIUM CHLORIDE 0.9% FLUSH
3.0000 mL | Freq: Two times a day (BID) | INTRAVENOUS | Status: DC
  Administered 2015-10-13 – 2015-10-25 (×17): 3 mL via INTRAVENOUS

## 2015-10-13 MED ORDER — OXYBUTYNIN CHLORIDE 5 MG PO TABS
5.0000 mg | ORAL_TABLET | Freq: Every day | ORAL | Status: DC
  Administered 2015-10-13: 5 mg via ORAL
  Filled 2015-10-13: qty 1

## 2015-10-13 MED ORDER — INSULIN DETEMIR 100 UNIT/ML ~~LOC~~ SOLN
20.0000 [IU] | Freq: Once | SUBCUTANEOUS | Status: AC
  Administered 2015-10-13: 20 [IU] via SUBCUTANEOUS
  Filled 2015-10-13: qty 0.2

## 2015-10-13 MED ORDER — CHLORHEXIDINE GLUCONATE 0.12% ORAL RINSE (MEDLINE KIT)
15.0000 mL | Freq: Two times a day (BID) | OROMUCOSAL | Status: DC
  Administered 2015-10-13 – 2015-10-14 (×2): 15 mL via OROMUCOSAL

## 2015-10-13 MED ORDER — ROCURONIUM BROMIDE 50 MG/5ML IV SOLN
INTRAVENOUS | Status: DC | PRN
  Administered 2015-10-13: 30 mg via INTRAVENOUS

## 2015-10-13 MED ORDER — LEVOTHYROXINE SODIUM 25 MCG PO TABS
25.0000 ug | ORAL_TABLET | ORAL | Status: DC
  Administered 2015-10-13: 25 ug via ORAL
  Filled 2015-10-13: qty 1

## 2015-10-13 MED ORDER — GABAPENTIN 300 MG PO CAPS
300.0000 mg | ORAL_CAPSULE | Freq: Three times a day (TID) | ORAL | Status: DC
  Administered 2015-10-13: 300 mg via ORAL
  Filled 2015-10-13: qty 1

## 2015-10-13 MED ORDER — ALBUTEROL SULFATE (2.5 MG/3ML) 0.083% IN NEBU: 2.5000 mg | INHALATION_SOLUTION | RESPIRATORY_TRACT | Status: DC | PRN

## 2015-10-13 MED ORDER — SODIUM CHLORIDE 0.9 % IV SOLN
INTRAVENOUS | Status: DC
  Administered 2015-10-13 – 2015-10-15 (×3): via INTRAVENOUS

## 2015-10-13 MED ORDER — LEVOTHYROXINE SODIUM 100 MCG IV SOLR
12.5000 ug | Freq: Every day | INTRAVENOUS | Status: DC
  Administered 2015-10-13 – 2015-10-20 (×8): 12.5 ug via INTRAVENOUS
  Filled 2015-10-13 (×8): qty 5

## 2015-10-13 MED ORDER — FUROSEMIDE 20 MG PO TABS
40.0000 mg | ORAL_TABLET | Freq: Every day | ORAL | Status: DC
  Administered 2015-10-13: 40 mg via ORAL
  Filled 2015-10-13: qty 2

## 2015-10-13 MED ORDER — INSULIN ASPART 100 UNIT/ML ~~LOC~~ SOLN
10.0000 [IU] | Freq: Once | SUBCUTANEOUS | Status: AC
  Administered 2015-10-13: 10 [IU] via INTRAVENOUS
  Filled 2015-10-13: qty 1

## 2015-10-13 MED ORDER — MIDAZOLAM HCL 5 MG/5ML IJ SOLN
INTRAMUSCULAR | Status: DC | PRN
  Administered 2015-10-13: 2 mg via INTRAVENOUS

## 2015-10-13 MED ORDER — ATORVASTATIN CALCIUM 80 MG PO TABS
80.0000 mg | ORAL_TABLET | Freq: Every day | ORAL | Status: DC
  Administered 2015-10-14 – 2015-10-20 (×7): 80 mg
  Filled 2015-10-13 (×7): qty 1

## 2015-10-13 MED ORDER — ONDANSETRON HCL 4 MG/2ML IJ SOLN: 4.0000 mg | Freq: Four times a day (QID) | INTRAMUSCULAR | Status: DC | PRN

## 2015-10-13 MED ORDER — HALOPERIDOL LACTATE 5 MG/ML IJ SOLN
INTRAMUSCULAR | Status: AC
  Administered 2015-10-13: 1 mg
  Filled 2015-10-13: qty 1

## 2015-10-13 MED ORDER — ENOXAPARIN SODIUM 40 MG/0.4ML ~~LOC~~ SOLN
40.0000 mg | SUBCUTANEOUS | Status: DC
Start: 2015-10-13 — End: 2015-10-13
  Filled 2015-10-13 (×2): qty 0.4

## 2015-10-13 MED ORDER — SODIUM CHLORIDE 0.9 % IV SOLN: INTRAVENOUS | Status: DC

## 2015-10-13 MED ORDER — PANTOPRAZOLE SODIUM 40 MG PO TBEC
40.0000 mg | DELAYED_RELEASE_TABLET | Freq: Every day | ORAL | Status: DC
  Administered 2015-10-13: 40 mg via ORAL
  Filled 2015-10-13: qty 1

## 2015-10-13 MED ORDER — FENTANYL CITRATE (PF) 100 MCG/2ML IJ SOLN
100.0000 ug | INTRAMUSCULAR | Status: DC | PRN
  Administered 2015-10-16 – 2015-10-18 (×5): 100 ug via INTRAVENOUS
  Filled 2015-10-13 (×5): qty 2

## 2015-10-13 MED ORDER — INSULIN ASPART 100 UNIT/ML ~~LOC~~ SOLN: 0.0000 [IU] | Freq: Three times a day (TID) | SUBCUTANEOUS | Status: DC

## 2015-10-13 MED ORDER — PANTOPRAZOLE SODIUM 40 MG IV SOLR: 40.0000 mg | INTRAVENOUS | Status: DC

## 2015-10-13 MED ORDER — ASPIRIN EC 81 MG PO TBEC
81.0000 mg | DELAYED_RELEASE_TABLET | Freq: Every day | ORAL | Status: DC
  Administered 2015-10-13: 81 mg via ORAL
  Filled 2015-10-13: qty 1

## 2015-10-13 MED ORDER — PROPOFOL 1000 MG/100ML IV EMUL
INTRAVENOUS | Status: AC
  Filled 2015-10-13: qty 100

## 2015-10-13 MED ORDER — ETOMIDATE 2 MG/ML IV SOLN
INTRAVENOUS | Status: DC | PRN
  Administered 2015-10-13: 10 mg via INTRAVENOUS

## 2015-10-13 MED ORDER — ANTISEPTIC ORAL RINSE SOLUTION (CORINZ)
7.0000 mL | Freq: Four times a day (QID) | OROMUCOSAL | Status: DC
  Administered 2015-10-14 (×2): 7 mL via OROMUCOSAL

## 2015-10-13 MED ORDER — LORAZEPAM 2 MG/ML IJ SOLN: 1.0000 mg | Freq: Once | INTRAMUSCULAR | Status: DC

## 2015-10-13 MED ORDER — ASPIRIN 81 MG PO CHEW
81.0000 mg | CHEWABLE_TABLET | Freq: Every day | ORAL | Status: DC
  Administered 2015-10-14 – 2015-10-20 (×7): 81 mg
  Filled 2015-10-13 (×8): qty 1

## 2015-10-13 MED ORDER — INSULIN DETEMIR 100 UNIT/ML ~~LOC~~ SOLN: 40.0000 [IU] | Freq: Two times a day (BID) | SUBCUTANEOUS | Status: DC

## 2015-10-13 MED ORDER — MIDAZOLAM HCL 2 MG/2ML IJ SOLN
INTRAMUSCULAR | Status: AC
  Filled 2015-10-13: qty 2

## 2015-10-13 MED ORDER — ONDANSETRON HCL 4 MG PO TABS: 4.0000 mg | ORAL_TABLET | Freq: Four times a day (QID) | ORAL | Status: DC | PRN

## 2015-10-13 MED ORDER — SODIUM CHLORIDE 0.9 % IV SOLN
1000.0000 mL | INTRAVENOUS | Status: DC
  Administered 2015-10-13: 1000 mL via INTRAVENOUS

## 2015-10-13 MED ORDER — SODIUM CHLORIDE 0.9 % IV SOLN
INTRAVENOUS | Status: DC
  Administered 2015-10-13: 5.4 [IU]/h via INTRAVENOUS
  Filled 2015-10-13: qty 2.5

## 2015-10-13 MED ORDER — LORAZEPAM 2 MG/ML IJ SOLN
INTRAMUSCULAR | Status: AC
  Administered 2015-10-13: 1 mg
  Filled 2015-10-13: qty 1

## 2015-10-13 MED ORDER — FENTANYL CITRATE (PF) 100 MCG/2ML IJ SOLN
INTRAMUSCULAR | Status: AC
  Filled 2015-10-13: qty 2

## 2015-10-13 MED ORDER — FAMOTIDINE IN NACL 20-0.9 MG/50ML-% IV SOLN
20.0000 mg | Freq: Two times a day (BID) | INTRAVENOUS | Status: DC
  Administered 2015-10-13 – 2015-10-20 (×14): 20 mg via INTRAVENOUS
  Filled 2015-10-13 (×16): qty 50

## 2015-10-13 MED ORDER — HALOPERIDOL LACTATE 5 MG/ML IJ SOLN: 5.0000 mg | Freq: Four times a day (QID) | INTRAMUSCULAR | Status: DC | PRN

## 2015-10-13 MED ORDER — SODIUM CHLORIDE 0.9 % IV SOLN
1000.0000 mL | Freq: Once | INTRAVENOUS | Status: AC
  Administered 2015-10-13: 1000 mL via INTRAVENOUS

## 2015-10-13 MED ORDER — ENOXAPARIN SODIUM 40 MG/0.4ML ~~LOC~~ SOLN
40.0000 mg | Freq: Every day | SUBCUTANEOUS | Status: DC
  Administered 2015-10-13 – 2015-10-20 (×8): 40 mg via SUBCUTANEOUS
  Filled 2015-10-13 (×8): qty 0.4

## 2015-10-13 MED ORDER — PROPOFOL 1000 MG/100ML IV EMUL
5.0000 ug/kg/min | INTRAVENOUS | Status: DC
  Administered 2015-10-13 – 2015-10-16 (×14): 40 ug/kg/min via INTRAVENOUS
  Filled 2015-10-13 (×15): qty 100

## 2015-10-13 MED ORDER — PROPOFOL 1000 MG/100ML IV EMUL
5.0000 ug/kg/min | Freq: Once | INTRAVENOUS | Status: AC
  Administered 2015-10-13: 20 ug/kg/min via INTRAVENOUS

## 2015-10-13 MED ORDER — FENTANYL CITRATE (PF) 100 MCG/2ML IJ SOLN
100.0000 ug | INTRAMUSCULAR | Status: AC | PRN
  Administered 2015-10-13 – 2015-10-14 (×3): 100 ug via INTRAVENOUS
  Filled 2015-10-13 (×3): qty 2

## 2015-10-13 MED ORDER — ATORVASTATIN CALCIUM 10 MG PO TABS
80.0000 mg | ORAL_TABLET | Freq: Every day | ORAL | Status: DC
  Administered 2015-10-13: 80 mg via ORAL
  Filled 2015-10-13: qty 8

## 2015-10-13 MED ORDER — FENTANYL CITRATE (PF) 100 MCG/2ML IJ SOLN
INTRAMUSCULAR | Status: DC | PRN
  Administered 2015-10-13: 100 ug via INTRAVENOUS

## 2015-10-13 MED ORDER — INSULIN ASPART 100 UNIT/ML ~~LOC~~ SOLN
0.0000 [IU] | SUBCUTANEOUS | Status: DC
  Administered 2015-10-13: 11 [IU] via SUBCUTANEOUS
  Administered 2015-10-13: 15 [IU] via SUBCUTANEOUS
  Administered 2015-10-14: 8 [IU] via SUBCUTANEOUS
  Administered 2015-10-14 (×3): 5 [IU] via SUBCUTANEOUS
  Administered 2015-10-14: 3 [IU] via SUBCUTANEOUS
  Administered 2015-10-14: 5 [IU] via SUBCUTANEOUS
  Administered 2015-10-14: 11 [IU] via SUBCUTANEOUS
  Administered 2015-10-15 (×4): 3 [IU] via SUBCUTANEOUS
  Administered 2015-10-15: 2 [IU] via SUBCUTANEOUS
  Administered 2015-10-16: 3 [IU] via SUBCUTANEOUS
  Administered 2015-10-16: 5 [IU] via SUBCUTANEOUS
  Administered 2015-10-16 (×2): 3 [IU] via SUBCUTANEOUS
  Administered 2015-10-16: 5 [IU] via SUBCUTANEOUS
  Administered 2015-10-16: 3 [IU] via SUBCUTANEOUS
  Administered 2015-10-17: 8 [IU] via SUBCUTANEOUS
  Administered 2015-10-17: 3 [IU] via SUBCUTANEOUS
  Administered 2015-10-17: 5 [IU] via SUBCUTANEOUS
  Administered 2015-10-17 (×2): 8 [IU] via SUBCUTANEOUS
  Administered 2015-10-18: 5 [IU] via SUBCUTANEOUS
  Administered 2015-10-18: 3 [IU] via SUBCUTANEOUS
  Administered 2015-10-18: 5 [IU] via SUBCUTANEOUS
  Administered 2015-10-18: 2 [IU] via SUBCUTANEOUS
  Administered 2015-10-18: 5 [IU] via SUBCUTANEOUS
  Administered 2015-10-18: 2 [IU] via SUBCUTANEOUS
  Administered 2015-10-19: 5 [IU] via SUBCUTANEOUS
  Administered 2015-10-19: 8 [IU] via SUBCUTANEOUS
  Administered 2015-10-19: 2 [IU] via SUBCUTANEOUS
  Filled 2015-10-13: qty 1

## 2015-10-13 NOTE — ED Notes (Signed)
Spoke with MD advised not to start D10. Advised to keep on Insulin drip and give Long active Insulin recheck in 1 hour.

## 2015-10-13 NOTE — Progress Notes (Signed)
eLink Physician-Brief Progress Note Patient Name: [name] DOB: [birth year] MRN: [id number]   Date of Service  10/13/2015  HPI/Events of Note  Notified of need for DVT prophylaxis. Creatinine = 1.26 and Platelets = 171K.  eICU Interventions  Will order Lovenox 40 mg [place] now and Q day.      Intervention Category Intermediate Interventions: Best-practice therapies (e.g. DVT, beta blocker, etc.)  Sholonda Jobst Eugene 10/13/2015, 9:30 PM

## 2015-10-13 NOTE — ED Notes (Signed)
Glick MD at bedside  

## 2015-10-13 NOTE — ED Notes (Signed)
Pt taken to restroom.

## 2015-10-13 NOTE — ED Notes (Signed)
RN at NT completed full lien change on patient's bed.

## 2015-10-13 NOTE — ED Notes (Signed)
MD placed order to stop Insulin drip 2 hours after Levemir given

## 2015-10-13 NOTE — ED Provider Notes (Signed)
CSN: [id number]     Arrival date & time 10/13/15  0111 History   First MD Initiated Contact with Patient 10/13/15 0448     Chief Complaint  Patient presents with  . Hyperglycemia  . Medication Management     (Consider location/radiation/quality/duration/timing/severity/associated sxs/prior Treatment) Patient is a 55 y.o. female presenting with hyperglycemia. The history is provided by the patient.  Hyperglycemia She States that her that sugar was high yesterday. She did not check her sugar but she knows when her blood sugars time by how she feels. History is somewhat difficult to obtain and she is giving inconsistent stories. She told triage nurse that she had not taken her insulin for 2 days but she told me that she has been taking her insulin but sometimes misses doses. Her mouth is dry. She's been urinating frequently. She denies abdominal pain, nausea, vomiting. She denies fever or chills.  Past Medical History  Diagnosis Date  . Diabetic neuropathy (HCC)   . Cellulitis   . Thyroid disease   . Hyperlipidemia   . Neuromuscular disorder (HCC)   . Concussion as a teenager    slight  . Diarrhea     pt takes Reglan tid  . Hypothyroidism     takes Synthroid daily  . Depression     takes CYmbalta daily  . MRSA (methicillin resistant staph aureus) culture positive     hx of 2012  . Staph infection 2007  . Complication of anesthesia 01/2013    "didn't know where I was; who I was; talking out the top of my head" (2013-05-12)  . Hypertension   . Peripheral vascular disease (HCC)   . CHF (congestive heart failure) (HCC)   . Pneumonia 1980's    "hospitalized w/it" (05-12-13)  . Chronic bronchitis (HCC)   . Orthopnea     "just one; yesterday morning" (05-12-13)  . Type II diabetes mellitus (HCC)     Novolog,Levemir,and Lovaza daily  . GERD (gastroesophageal reflux disease)   . Daily headache   . Rheumatoid arthritis (HCC)     "both knees" (12-May-2013)  . Anxiety   . Kidney stone      "passed on before" (05-12-13)  . Non-healing wound of lower extremity     lt foot  . OSA on CPAP     and oxygen  . COPD (chronic obstructive pulmonary disease) (HCC)     wear oxygen at home  . Symptomatic bradycardia 03/04/2014   Past Surgical History  Procedure Laterality Date  . Amputation    . Leg amputation below knee Right 2012  . Toe amputation Left ?2011     only 2 toes remaning.   . Tubal ligation  1990's  . Foot surgery Right 2012    "put pins in one year; amputated toes another OR" (2013-05-12)  . Wrist surgery Right 1980's    "pinched nerve" (05/12/2013)  . Dilation and curettage of uterus  1980's  . Refractive surgery Bilateral 2014  . Incision and drainage abscess N/A 01/24/2013    Procedure: INCISION AND DRAINAGE ABSCESS;  Surgeon: Clovis Pu. Cornett, MD;  Location: [facility] OR;  Service: General;  Laterality: N/A;  . Inguinal hernia repair N/A 01/28/2013    Procedure: I&D Right Groin Wound;  Surgeon: Shelly Rubenstein, MD;  Location: [facility] OR;  Service: General;  Laterality: N/A;  . Ganglion cyst excision Left 1990's    "wrist" (2013-05-12)  . Bladder surgery  1970's    "stretched the mouth of my bladder" (05-12-13)  .  I&d extremity Left 08/25/2013    Procedure: IRRIGATION AND DEBRIDEMENT EXTREMITY;  Surgeon: Axel Filler, MD;  Location: [facility] OR;  Service: General;  Laterality: Left;  . Pacemaker insertion  03/05/14    Biotronik dual chamber pacemaker implanted by Dr Graciela Husbands for symptomatic bradycardia  . Permanent pacemaker insertion N/A 03/05/2014    Procedure: PERMANENT PACEMAKER INSERTION;  Surgeon: Duke Salvia, MD;  Location: [facility] CATH LAB;  Service: Cardiovascular;  Laterality: N/A;  . Stump revision Right 12/11/2014    Procedure: Right Below Knee Amputation Revision;  Surgeon: Nadara Mustard, MD;  Location: [facility] OR;  Service: Orthopedics;  Laterality: Right;   Family History  Problem Relation Age of Onset  . Hypertension Mother   . Hyperlipidemia Mother   . Hypertrophic  cardiomyopathy Mother     has pacer  . Hypertension Brother   . Hyperlipidemia Brother   . Cancer Maternal Aunt   . Diabetes Paternal Aunt   . Diabetes Paternal Uncle   . Diabetes Paternal Grandmother   . Anesthesia problems Neg Hx   . Hypotension Neg Hx   . Malignant hyperthermia Neg Hx   . Pseudochol deficiency Neg Hx   . Alzheimer's disease Father    Social History  Substance Use Topics  . Smoking status: Current Every Day Smoker -- 1.00 packs/day for 33 years    Types: Cigarettes  . Smokeless tobacco: Never Used  . Alcohol Use: No   OB History    No data available     Review of Systems  All other systems reviewed and are negative.     Allergies  Erythromycin; Iodine-131; Gadolinium; Ivp dye; Metformin; and Penicillins  Home Medications   Prior to Admission medications   Medication Sig Start Date End Date Taking? Authorizing Provider  aspirin EC 81 MG tablet Take 81 mg by mouth daily.    Historical Provider, MD  atorvastatin (LIPITOR) 80 MG tablet Take 80 mg by mouth daily.    Historical Provider, MD  furosemide (LASIX) 40 MG tablet Take 40 mg by mouth daily. 03/29/14   Historical Provider, MD  gabapentin (NEURONTIN) 300 MG capsule Take 300 mg by mouth 3 (three) times daily. 06/12/14   Historical Provider, MD  insulin aspart (NOVOLOG) 100 UNIT/ML injection Inject 13 Units into the skin 3 (three) times daily with meals. Patient taking differently: Inject 8-28 Units into the skin 4 (four) times daily. Sliding scale 12/14/14   Harold Barban, MD  insulin detemir (LEVEMIR) 100 UNIT/ML injection Inject 0.77 mLs (77 Units total) into the skin daily. Patient taking differently: Inject 90 Units into the skin daily.  12/14/14   Harold Barban, MD  levothyroxine (SYNTHROID, LEVOTHROID) 25 MCG tablet Take 25 mcg by mouth every morning.     Historical Provider, MD  nitrofurantoin, macrocrystal-monohydrate, (MACROBID) 100 MG capsule Take 1 capsule (100 mg total) by mouth 2 (two) times  daily. X 7 days 10/01/15   Tomasita Crumble, MD  NOVOLOG FLEXPEN 100 UNIT/ML FlexPen Take 8-25 Units by mouth daily. Sliding scale 05/29/14   Historical Provider, MD  omeprazole (PRILOSEC) 20 MG capsule Take 20 mg by mouth daily.    Historical Provider, MD  ondansetron (ZOFRAN ODT) 8 MG disintegrating tablet Take 1 tablet (8 mg total) by mouth every 8 (eight) hours as needed for nausea. 09/15/14   Derwood Kaplan, MD  oxybutynin (DITROPAN) 5 MG tablet Take 5 mg by mouth every morning.      Historical Provider, MD   BP 173/64 mmHg  Pulse  72  Temp(Src) 98.3 F (36.8 C) (Oral)  Resp 18  Ht  (1.651 m)  Wt 216 lb (97.977 kg)  BMI 35.94 kg/m2  SpO2 94% Physical Exam  Nursing note and vitals reviewed.  55 year old female, resting comfortably and in no acute distress. Vital signs are significant for hypertension. Oxygen saturation is 94%, which is normal. Head is normocephalic and atraumatic. PERRLA, EOMI. Oropharynx is clear. Mucous membranes are dry. Neck is nontender and supple without adenopathy or JVD. Back is nontender and there is no CVA tenderness. Lungs are clear without rales, wheezes, or rhonchi. Chest is nontender. Heart has regular rate and rhythm without murmur. Abdomen is soft, flat, nontender without masses or hepatosplenomegaly and peristalsis is normoactive. Extremities: Right below-the-knee amputation. Skin is warm and dry without rash. Neurologic: Mental status is normal, cranial nerves are intact, there are no motor or sensory deficits. ED Course  Procedures (including critical care time) Labs Review Results for orders placed or performed during the hospital encounter of 10/13/15  Basic metabolic panel  Result Value Ref Range   Sodium 128 (L) 135 - 145 mmol/L   Potassium 5.0 3.5 - 5.1 mmol/L   Chloride 93 (L) 101 - 111 mmol/L   CO2 20 (L) 22 - 32 mmol/L   Glucose, Bld 939 (HH) 65 - 99 mg/dL   BUN 22 (H) 6 - 20 mg/dL   Creatinine, Ser 1.61 (H) 0.44 - 1.00 mg/dL    Calcium 8.9 8.9 - 09.6 mg/dL   GFR calc non Af Amer 39 (L) >60 mL/min   GFR calc Af Amer 45 (L) >60 mL/min   Anion gap 15 5 - 15  CBC  Result Value Ref Range   WBC 6.3 4.0 - 10.5 K/uL   RBC 5.03 3.87 - 5.11 MIL/uL   Hemoglobin 13.9 12.0 - 15.0 g/dL   HCT 04.5 40.9 - 81.1 %   MCV 83.5 78.0 - 100.0 fL   MCH 27.6 26.0 - 34.0 pg   MCHC 33.1 30.0 - 36.0 g/dL   RDW 91.4 78.2 - 95.6 %   Platelets 171 150 - 400 K/uL  Urinalysis, Routine w reflex microscopic (not at [facility])  Result Value Ref Range   Color, Urine STRAW (A) YELLOW   APPearance CLEAR CLEAR   Specific Gravity, Urine 1.028 1.005 - 1.030   pH 5.5 5.0 - 8.0   Glucose, UA >1000 (A) NEGATIVE mg/dL   Hgb urine dipstick SMALL (A) NEGATIVE   Bilirubin Urine NEGATIVE NEGATIVE   Ketones, ur NEGATIVE NEGATIVE mg/dL   Protein, ur 213 (A) NEGATIVE mg/dL   Nitrite NEGATIVE NEGATIVE   Leukocytes, UA NEGATIVE NEGATIVE  Urine microscopic-add on  Result Value Ref Range   Squamous Epithelial / LPF 0-5 (A) NONE SEEN   WBC, UA 0-5 0 - 5 WBC/hpf   RBC / HPF 0-5 0 - 5 RBC/hpf   Bacteria, UA RARE (A) NONE SEEN   Urine-Other YEAST PRESENT   CBG monitoring, ED  Result Value Ref Range   Glucose-Capillary >600 (HH) 65 - 99 mg/dL   Comment 1 Notify RN    Comment 2 Document in Chart     I have personally reviewed and evaluated these lab results as part of my medical decision-making.   CRITICAL CARE Performed by: Dione Booze Total critical care time: 35 minutes Critical care time was exclusive of separately billable procedures and treating other patients. Critical care was necessary to treat or prevent imminent or life-threatening deterioration. Critical care was  time spent personally by me on the following activities: development of treatment plan with patient and/or surrogate as well as nursing, discussions with consultants, evaluation of patient's response to treatment, examination of patient, obtaining history from patient or surrogate,  ordering and performing treatments and interventions, ordering and review of laboratory studies, ordering and review of radiographic studies, pulse oximetry and re-evaluation of patient's condition. MDM   Final diagnoses:  Hyperglycemia    Severe hyperglycemia. Although anion gap is normal, CO2 is noted to have dropped compared with last value and is slightly low. I worry that she is starting to go into ketoacidosis. Renal insufficiency is present and is slightly worse than last value recorded but within the range that she has been in. She is started on glucose stabilizer and will need to be admitted. Old records are reviewed and she does have ED visits for hyperglycemia but it do not see any prior hospital admissions for hyperglycemia.Case is discussed with Dr. Toniann Fail of [facility] who agrees to admit the patient and requests he be placed on stepdown unit.    Dione Booze, MD 10/13/15 [phone]

## 2015-10-13 NOTE — ED Notes (Signed)
Critical aware patient CBG over 400 advised to give 15 units repeat in 4 hours if still high patient to be placed on Drip.

## 2015-10-13 NOTE — H&P (Addendum)
[facility] History and Physical  [name]:[id number] DOB: 1961-04-20 DOA: 10/13/2015  Referring physician: Dr Preston Fleeting - [facility] PCP: [name], MD   Chief Complaint: Hyperglycemia  HPI: [name] is a 55 y.o. female  Level V caveat: Patient presenting with altered mental state. Patient actively endorsing auditory hallucinations. Patient states that she is "hearing stuff. " Patient states that she recognizes that she is hearing voices and knows that they are not real. States that the voices are telling her to take her medicines and to go to sleep. Patient states that her home glucose ranges from 300-400. States that she has reversed before and is being treated by her primary care doctor Dr. Reche Dixon. Patient's only complaint at this time is that her sugars are high and she occasionally has to pee but states that this is normal.    Review of Systems: Per HPI. Unable to obtain further review of systems due to patient's mental status.  Past Medical History  Diagnosis Date  . Diabetic neuropathy (HCC)   . Cellulitis   . Thyroid disease   . Hyperlipidemia   . Neuromuscular disorder (HCC)   . Concussion as a teenager    slight  . Diarrhea     pt takes Reglan tid  . Hypothyroidism     takes Synthroid daily  . Depression     takes CYmbalta daily  . MRSA (methicillin resistant staph aureus) culture positive     hx of 2012  . Staph infection 2007  . Complication of anesthesia 01/2013    "didn't know where I was; who I was; talking out the top of my head" (April 22, 2013)  . Hypertension   . Peripheral vascular disease (HCC)   . CHF (congestive heart failure) (HCC)   . Pneumonia 1980's    "hospitalized w/it" (Apr 22, 2013)  . Chronic bronchitis (HCC)   . Orthopnea     "just one; yesterday morning" (04/22/2013)  . Type II diabetes mellitus (HCC)     Novolog,Levemir,and Lovaza daily  . GERD (gastroesophageal reflux disease)   . Daily headache   . Rheumatoid arthritis  (HCC)     "both knees" (Apr 22, 2013)  . Anxiety   . Kidney stone     "passed on before" (04-22-13)  . Non-healing wound of lower extremity     lt foot  . OSA on CPAP     and oxygen  . COPD (chronic obstructive pulmonary disease) (HCC)     wear oxygen at home  . Symptomatic bradycardia 03/04/2014   Past Surgical History  Procedure Laterality Date  . Amputation    . Leg amputation below knee Right 2012  . Toe amputation Left ?2011     only 2 toes remaning.   . Tubal ligation  1990's  . Foot surgery Right 2012    "put pins in one year; amputated toes another OR" (04/22/2013)  . Wrist surgery Right 1980's    "pinched nerve" (Apr 22, 2013)  . Dilation and curettage of uterus  1980's  . Refractive surgery Bilateral 2014  . Incision and drainage abscess N/A 01/24/2013    Procedure: INCISION AND DRAINAGE ABSCESS;  Surgeon: Clovis Pu. Cornett, MD;  Location: [facility] OR;  Service: General;  Laterality: N/A;  . Inguinal hernia repair N/A 01/28/2013    Procedure: I&D Right Groin Wound;  Surgeon: Shelly Rubenstein, MD;  Location: [facility] OR;  Service: General;  Laterality: N/A;  . Ganglion cyst excision Left 1990's    "wrist" (Apr 22, 2013)  . Bladder  surgery  1970's    "stretched the mouth of my bladder" (04/21/2013)  . I&d extremity Left 08/25/2013    Procedure: IRRIGATION AND DEBRIDEMENT EXTREMITY;  Surgeon: Axel Filler, MD;  Location: [facility] OR;  Service: General;  Laterality: Left;  . Pacemaker insertion  03/05/14    Biotronik dual chamber pacemaker implanted by Dr Graciela Husbands for symptomatic bradycardia  . Permanent pacemaker insertion N/A 03/05/2014    Procedure: PERMANENT PACEMAKER INSERTION;  Surgeon: Duke Salvia, MD;  Location: [name] C[facility] LAB;  Service: Cardiovascular;  Laterality: N/A;  . Stump revision Right 12/11/2014    Procedure: Right Below Knee Amputation Revision;  Surgeon: Nadara Mustard, MD;  Location: [facility] OR;  Service: Orthopedics;  Laterality: Right;   Social History:  reports that she has been smoking  Cigarettes.  She has a 33 pack-year smoking history. She has never used smokeless tobacco. She reports that she does not drink alcohol or use illicit drugs.  Allergies  Allergen Reactions  . Erythromycin Diarrhea  . Iodine-131 Nausea And Vomiting  . Gadolinium Nausea And Vomiting     Code: VOM, Desc: Pt began vomiting immed post infusion of multihance, Onset Date: [id number]   . Ivp Dye [Iodinated Diagnostic Agents] Nausea And Vomiting    Nausea and vomiting   . Metformin Diarrhea     diarrhea  . Penicillins Hives    Has patient had a PCN reaction causing immediate rash, facial/tongue/throat swelling, SOB or lightheadedness with hypotension: No Has patient had a PCN reaction causing severe rash involving mucus membranes or skin necrosis: No Has patient had a PCN reaction that required hospitalization No Has patient had a PCN reaction occurring within the last 10 years: No If all of the above answers are "NO", then may proceed with Cephalosporin use.     Family History  Problem Relation Age of Onset  . Hypertension Mother   . Hyperlipidemia Mother   . Hypertrophic cardiomyopathy Mother     has pacer  . Hypertension Brother   . Hyperlipidemia Brother   . Cancer Maternal Aunt   . Diabetes Paternal Aunt   . Diabetes Paternal Uncle   . Diabetes Paternal Grandmother   . Anesthesia problems Neg Hx   . Hypotension Neg Hx   . Malignant hyperthermia Neg Hx   . Pseudochol deficiency Neg Hx   . Alzheimer's disease Father      Prior to Admission medications   Medication Sig Start Date End Date Taking? Authorizing Provider  aspirin EC 81 MG tablet Take 81 mg by mouth daily.   Yes Historical Provider, MD  atorvastatin (LIPITOR) 80 MG tablet Take 80 mg by mouth daily.   Yes Historical Provider, MD  Exenatide ER 2 MG PEN Inject 2 mg into the skin every 7 (seven) days.    Yes Historical Provider, MD  furosemide (LASIX) 40 MG tablet Take 40 mg by mouth daily. 03/29/14  Yes Historical  Provider, MD  gabapentin (NEURONTIN) 300 MG capsule Take 300 mg by mouth 3 (three) times daily. 06/12/14  Yes Historical Provider, MD  insulin aspart (NOVOLOG) 100 UNIT/ML injection Inject 13 Units into the skin 3 (three) times daily with meals. Patient taking differently: Inject 8-28 Units into the skin 4 (four) times daily. Sliding scale 12/14/14  Yes Harold Barban, MD  insulin detemir (LEVEMIR) 100 UNIT/ML injection Inject 0.77 mLs (77 Units total) into the skin daily. Patient taking differently: Inject 90 Units into the skin daily.  12/14/14  Yes Harold Barban, MD  levothyroxine (  SYNTHROID, LEVOTHROID) 25 MCG tablet Take 25 mcg by mouth every morning.    Yes Historical Provider, MD  omeprazole (PRILOSEC) 20 MG capsule Take 20 mg by mouth daily.   Yes Historical Provider, MD  ondansetron (ZOFRAN ODT) 8 MG disintegrating tablet Take 1 tablet (8 mg total) by mouth every 8 (eight) hours as needed for nausea. 09/15/14  Yes Derwood Kaplan, MD  oxybutynin (DITROPAN) 5 MG tablet Take 5 mg by mouth every morning.     Yes Historical Provider, MD  exenatide (BYETTA) 5 MCG/0.02ML SOPN injection Inject 5 mcg into the skin 2 (two) times daily with a meal.    Historical Provider, MD  nitrofurantoin, macrocrystal-monohydrate, (MACROBID) 100 MG capsule Take 1 capsule (100 mg total) by mouth 2 (two) times daily. X 7 days Patient not taking: Reported on 10/13/2015 10/01/15   Tomasita Crumble, MD  NOVOLOG FLEXPEN 100 UNIT/ML FlexPen Take 8-25 Units by mouth daily. Sliding scale 05/29/14   Historical Provider, MD   Physical Exam: Filed Vitals:   10/13/15 0532 10/13/15 0601 10/13/15 0615 10/13/15 0700  BP: 154/100 156/78 162/108 124/68  Pulse: 63  87 59  Temp:      TempSrc:      Resp: 19 17 20 21   Height:      Weight:      SpO2: 99% 98% 100% 97%    Wt Readings from Last 3 Encounters:  10/13/15 97.977 kg (216 lb)  07/09/15 98.884 kg (218 lb)  12/13/14 105.9 kg (233 lb 7.5 oz)    General: Sleepy but arousable.  Calm. Eyes:  PERRL, EOMI, normal lids, iris ENT: Dry MM Neck:  no LAD, masses or thyromegaly Cardiovascular:  RRR, no m/r/g. No LE edema.  Respiratory:  CTA bilaterally, no w/r/r. Normal respiratory effort. Abdomen:  soft, ntnd Skin:  no rash or induration seen on limited exam Musculoskeletal:  grossly normal tone BUE/BLE Psychiatric:  Endorsing auditory hallucinations, answers questions appropriately, very sleepy and unable to fully participate.  Neurologic:  CN 2-12 grossly intact, moves all extremities in coordinated fashion.          Labs on Admission:  Basic Metabolic Panel:  Recent Labs Lab 10/12/15 2142  NA 128*  K 5.0  CL 93*  CO2 20*  GLUCOSE 939*  BUN 22*  CREATININE 1.48*  CALCIUM 8.9   Liver Function Tests: No results for input(s): AST, ALT, ALKPHOS, BILITOT, PROT, ALBUMIN in the last 168 hours. No results for input(s): LIPASE, AMYLASE in the last 168 hours. No results for input(s): AMMONIA in the last 168 hours. CBC:  Recent Labs Lab 10/12/15 2142  WBC 6.3  HGB 13.9  HCT 42.0  MCV 83.5  PLT 171   Cardiac Enzymes: No results for input(s): CKTOTAL, CKMB, CKMBINDEX, TROPONINI in the last 168 hours.  BNP (last 3 results) No results for input(s): BNP in the last 8760 hours.  ProBNP (last 3 results) No results for input(s): PROBNP in the last 8760 hours.   CREATININE: 1.48 mg/dL ABNORMAL (10/14/15 16/10/96) Estimated creatinine clearance - 50.4 mL/min  CBG:  Recent Labs Lab 10/13/15 0128 10/13/15 0634 10/13/15 0727  GLUCAP >600* 415* 357*    Radiological Exams on Admission: No results found.   Assessment/Plan Active Problems:   Hyponatremia   HTN (hypertension)   Bradycardia   Hyperglycemia   Diabetes mellitus due to underlying condition with hyperosmolarity without nonketotic hyperglycemic-hyperosmolar coma [facility]) (HCC)   CKD (chronic kidney disease) stage 2, GFR 60-89 ml/min   AKI (acute kidney  injury) (HCC)   Diastolic CHF (HCC)    Acute encephalopathy  Acute encephalopathy: likely sedonary to HHNK. Auditory hallucinations, and intermittent somnolence. Unlikely related to seizure, medications, infection. - Treatment of HHNK as below - UDS - ABG, lactic acid - O2 PRN - BCX - BMET and CBG monitoring - Psych consult if persists as w/ h/o depression  HHNK: Serum glucose 939 on admission, no acidosis, no ketones, AMS. Glucose stabilizer started by ED. Unable to fully assertain pts knowledge base w/ regards to DM given her mental state. Pt obviously poorly controlled given her DM related complications. Last A1c 10.9 11/2014. Pts insulin could be optimized. Pt home SSI is for 8-28 units Novolog QID.  - Glucose stabilizer - Change home Levemir (90 units QHS) as it looses efficacy after 60 units per dose. Will change to 40 units BID w/ first dose now.  - SSI - when off stabilizer - DM education - Social work consult  AoCKD: mild. Cr 1.48. Baseline 1.3. Was 0.9 10 mo ago.  - IVF - BMET - DM control  Diastolic CHF: no acute decompensation though pt receiving large volume IVF due to HHNK. Last Echo, 2015, showing Grade 2 diastolic dysfunction. - Continue lasix - Strict I/O, dly wts - Repeat Echo - Consider starting ACEi and bblocker after improvement in renal function and acute illness.  Hyponatremia: 128 (given Hyperglycemia Na is actually nml).  - IVF, and correction of hyperglycemia  Hypertension: no previous h/o HTN.  - Hydralazine PRN  Bradycardia: from sinus node dysfunction w/ pacemaker placement in 2015. Pulse 60-70 on admission - f/u Cards outpt.  - EKG  R BKA:  - PT/OT  HLD: - continue lipitor  Hypothyroid: - continue synthroid  GERD: - continue PPI  Peripheral neuropathy: - continue neurontin    Code Status: FULL  DVT Prophylaxis: Lovenox Family Communication: none Disposition Plan: Pending Improvement    MERRELL, DAVID J, MD Family [facility] [email or web address] Password [email or web address]

## 2015-10-13 NOTE — ED Notes (Signed)
RN started given Oral contrast Via OG . Contrast noted in patient mouth placement for OG rechecked.

## 2015-10-13 NOTE — Plan of Care (Signed)
55 year old female with history of diabetes mellitus presents with elevated blood sugar and is found to have blood sugar in the 900s. Patient was started on insulin infusion and admitted for hyperosmolar status.  [name].

## 2015-10-13 NOTE — Progress Notes (Signed)
Patient arrived to 2H, intubated and sedated on 50 mcg of propofol. Per radiology consider replacement of OG tube. Orders per Dr. Arsenio Loader to replace OG and get Abdominal CXR. Report given to night shift RN.

## 2015-10-13 NOTE — Consult Note (Signed)
PULMONARY / CRITICAL CARE MEDICINE   Name: [name] MRN: [id number] DOB: 15-May-1961    ADMISSION DATE:  10/13/2015 CONSULTATION DATE:  2/22  REFERRING MD:  FPTS   CHIEF COMPLAINT:  AMS, lactic acidosis, HHNK   HISTORY OF PRESENT ILLNESS:   55yo female with multiple medical problems including DM with PVD s/p R BKA, HTN, dCHF, CKD 2, GERD, RA, COPD, OSA on CPAP. She presented 2/22 with AMS endorsing auditory hallucinations and increased blood glucose. Her blood sugar was noted to be 939 on admission without ketones or acidosis. Initial w/u also revealed hyponatremia and cute on CKD. She was started on IV insulin gtt and given fluids. She was awaiting admission when she became increasingly confused, combative and a f/u lactic acid was elevated at 5 and PCCM consulted to assess for ICU admission.    On Dr. Gwendolyn Grant arrival pt was in acute distress, RR 50's, desatting.  Intubated urgently.   PAST MEDICAL HISTORY :  She  has a past medical history of Diabetic neuropathy (HCC); Cellulitis; Thyroid disease; Hyperlipidemia; Neuromuscular disorder (HCC); Concussion (as a teenager); Diarrhea; Hypothyroidism; Depression; MRSA (methicillin resistant staph aureus) culture positive; Staph infection (2007); Complication of anesthesia (01/2013); Hypertension; Peripheral vascular disease (HCC); CHF (congestive heart failure) (HCC); Pneumonia (1980's); Chronic bronchitis (HCC); Orthopnea; Type II diabetes mellitus (HCC); GERD (gastroesophageal reflux disease); Daily headache; Rheumatoid arthritis (HCC); Anxiety; Kidney stone; Non-healing wound of lower extremity; OSA on CPAP; COPD (chronic obstructive pulmonary disease) (HCC); and Symptomatic bradycardia (03/04/2014).  PAST SURGICAL HISTORY: She  has past surgical history that includes Amputation; Leg amputation below knee (Right, 2012); Toe amputation (Left, ?2011); Tubal ligation (1990's); Foot surgery (Right, 2012); Wrist surgery (Right, 1980's);  Dilation and curettage of uterus (1980's); Refractive surgery (Bilateral, 2014); Incision and drainage abscess (N/A, 01/24/2013); Inguinal hernia repair (N/A, 01/28/2013); Ganglion cyst excision (Left, 1990's); Bladder surgery (1970's); I&D extremity (Left, 08/25/2013); Pacemaker insertion (03/05/14); permanent pacemaker insertion (N/A, 03/05/2014); and Stump revision (Right, 12/11/2014).  Allergies  Allergen Reactions  . Erythromycin Diarrhea  . Iodine-131 Nausea And Vomiting  . Gadolinium Nausea And Vomiting     Code: VOM, Desc: Pt began vomiting immed post infusion of multihance, Onset Date: [id number]   . Ivp Dye [Iodinated Diagnostic Agents] Nausea And Vomiting    Nausea and vomiting   . Metformin Diarrhea     diarrhea  . Penicillins Hives    Has patient had a PCN reaction causing immediate rash, facial/tongue/throat swelling, SOB or lightheadedness with hypotension: No Has patient had a PCN reaction causing severe rash involving mucus membranes or skin necrosis: No Has patient had a PCN reaction that required hospitalization No Has patient had a PCN reaction occurring within the last 10 years: No If all of the above answers are "NO", then may proceed with Cephalosporin use.     No current facility-administered medications on file prior to encounter.   Current Outpatient Prescriptions on File Prior to Encounter  Medication Sig  . aspirin EC 81 MG tablet Take 81 mg by mouth daily.  Marland Kitchen atorvastatin (LIPITOR) 80 MG tablet Take 80 mg by mouth daily.  . furosemide (LASIX) 40 MG tablet Take 40 mg by mouth daily.  Marland Kitchen gabapentin (NEURONTIN) 300 MG capsule Take 300 mg by mouth 3 (three) times daily.  . insulin aspart (NOVOLOG) 100 UNIT/ML injection Inject 13 Units into the skin 3 (three) times daily with meals. (Patient taking differently: Inject 8-28 Units into the skin 4 (four) times daily. Sliding scale)  .  insulin detemir (LEVEMIR) 100 UNIT/ML injection Inject 0.77 mLs (77 Units total) into the  skin daily. (Patient taking differently: Inject 90 Units into the skin daily. )  . levothyroxine (SYNTHROID, LEVOTHROID) 25 MCG tablet Take 25 mcg by mouth every morning.   Marland Kitchen omeprazole (PRILOSEC) 20 MG capsule Take 20 mg by mouth daily.  . ondansetron (ZOFRAN ODT) 8 MG disintegrating tablet Take 1 tablet (8 mg total) by mouth every 8 (eight) hours as needed for nausea.  Marland Kitchen oxybutynin (DITROPAN) 5 MG tablet Take 5 mg by mouth every morning.    . nitrofurantoin, macrocrystal-monohydrate, (MACROBID) 100 MG capsule Take 1 capsule (100 mg total) by mouth 2 (two) times daily. X 7 days (Patient not taking: Reported on 10/13/2015)  . NOVOLOG FLEXPEN 100 UNIT/ML FlexPen Take 8-25 Units by mouth daily. Sliding scale    FAMILY HISTORY:  Her indicated that her mother is alive. She indicated that her father is alive.   SOCIAL HISTORY: She  reports that she has been smoking Cigarettes.  She has a 33 pack-year smoking history. She has never used smokeless tobacco. She reports that she does not drink alcohol or use illicit drugs.  REVIEW OF SYSTEMS:   As per HPI - otherwise unable to obtain r/t AMS.    SUBJECTIVE:    VITAL SIGNS: BP 148/107 mmHg  Pulse 87  Temp(Src) 99.7 F (37.6 C) (Rectal)  Resp 96  Ht 5\' 5"  (1.651 m)  Wt 97.977 kg (216 lb)  BMI 35.94 kg/m2  SpO2 96%  HEMODYNAMICS:    VENTILATOR SETTINGS: Vent Mode:  [-] PRVC FiO2 (%):  [100 %] 100 % Set Rate:  [22 bmp] 22 bmp Vt Set:  [500 mL] 500 mL PEEP:  [5 cmH20] 5 cmH20 Plateau Pressure:  [18 cmH20] 18 cmH20  INTAKE / OUTPUT:    PHYSICAL EXAMINATION: General:  Chronically ill appearing female, NAD post intubation  Neuro:  Sedated post intubation  HEENT:  Mm moist, ETT, R IJ CVL c/d  Cardiovascular:  s1s2 rrr Lungs:  Distant, resps even non labored on vent  Abdomen:  Round, soft, non distended, non tender per RN Musculoskeletal:  Warm and dry, R BKA, ulceration L foot   LABS:  BMET  Recent Labs Lab 10/12/15 2142   NA 128*  K 5.0  CL 93*  CO2 20*  BUN 22*  CREATININE 1.48*  GLUCOSE 939*    Electrolytes  Recent Labs Lab 10/12/15 2142  CALCIUM 8.9    CBC  Recent Labs Lab 10/12/15 2142  WBC 6.3  HGB 13.9  HCT 42.0  PLT 171    Coag's No results for input(s): APTT, INR in the last 168 hours.  Sepsis Markers  Recent Labs Lab 10/13/15 0953 10/13/15 1426 10/13/15 1510  LATICACIDVEN 1.9 5.17* 5.88*    ABG  Recent Labs Lab 10/13/15 0844 10/13/15 1452 10/13/15 1535  PHART 7.310* 7.246* 7.212*  PCO2ART 52.1* 51.1* 59.3*  PO2ART 81.0 57.0* 328.0*    Liver Enzymes No results for input(s): AST, ALT, ALKPHOS, BILITOT, ALBUMIN in the last 168 hours.  Cardiac Enzymes No results for input(s): TROPONINI, PROBNP in the last 168 hours.  Glucose  Recent Labs Lab 10/13/15 0727 10/13/15 0841 10/13/15 0941 10/13/15 1049 10/13/15 1205 10/13/15 1350  GLUCAP 357* 330* 328* 320* 237* 362*    Imaging Ct Head Wo Contrast  10/13/2015  CLINICAL DATA:  Acute encephalopathy. Altered mental status. Hallucinations. EXAM: CT HEAD WITHOUT CONTRAST TECHNIQUE: Contiguous axial images were obtained from the base of  the skull through the vertex without intravenous contrast. COMPARISON:  10/01/2015 FINDINGS: Study is degraded by motion. Allowing for that, there is no evidence of old or acute infarction, mass lesion, hemorrhage, hydrocephalus or extra-axial collection. Sinuses are clear. No calvarial abnormality. IMPRESSION: Motion degraded study.  Normal examination allowing for that. Electronically Signed   By: Paulina Fusi M.D.   On: 10/13/2015 14:21     STUDIES:  CT head 2/22>>> neg acute (motion degraded)  2D echo 2/22>>>  CULTURES: BC x2 2/22>>> Urine 2/22>>>  ANTIBIOTICS: Vanc 2/22>>> Zosyn 2/22>>>  SIGNIFICANT EVENTS:   LINES/TUBES: ETT 2/22>>> R IJ CVL 2/22>>>  DISCUSSION:   ASSESSMENT / PLAN:  PULMONARY Acute respiratory failure - r/t AMS, lactic acidosis Hx  COPD P:   Vent support - 8cc/kg  F/u CXR  F/u ABG PRN BD   CARDIOVASCULAR Hx HTN Troponin elevation - mild  Elevated lactate P:  Trend troponin  2D echo  Check CVP  Hold home lasix  RENAL Acute on CKD  Hyponatremia  P:   F/u chem  Gentle volume  F/u lactate now  NS @ 124ml/hr   GASTROINTESTINAL Diarrhea  P:   Check CDiff  CT abd/pelvis r/o infection   HEMATOLOGIC No active issue  P:  Sq heparin  F/u CBC   INFECTIOUS No clear source for infection but unknown cause of elevated lactate  P:   Pan culture  Empiric abx as above  Pct  Trend lactate   ENDOCRINE DM2 HHNK - no acidosis, neg ketones   Hypothyroid  P:   Hold insulin gtt for now  SSI alone for now  Check tsh Cont home synthroid   NEUROLOGIC AMS -  Likely metabolic encephalopathy.   P:   RASS goal: -1 Propofol gtt    FAMILY  - Updates:  No family available 2/22   Dirk Dress, NP 10/13/2015  3:38 PM Pager: [phone] or [phone]  STAFF NOTE: Cindi Carbon, MD FACP have personally reviewed patient's available data, including medical history, events of note, physical examination and test results as part of my evaluation. I have discussed with resident/NP and other care providers such as pharmacist, RN and RRT. In addition, I personally evaluated patient and elicited key findings of: upon walking in room, she is in severe distress, altered MS, required emergent intubation and line placement, lactic noted rising in setting HONK and dehydration, lactic seems out of proportion to HONK and volume status, ABG repeat , increase rate, repeat ABG, lactic acidosis unclear etiology, get trop, echo, CT abdo assess for abdo ischemia with increased stools in ER, get cvp, volume resus and repeat lactic, although exam is reassuring, assess stool cdiff, add empiric abx abdo for now, requires prop for now, int fent, there was no family present to discuss, RN stated in am she had normal MS, i  thnk her uncompensated acidosis has caused her neuro issues, re assess after correction ph , get cvp The patient is critically ill with multiple organ systems failure and requires high complexity decision making for assessment and support, frequent evaluation and titration of therapies, application of advanced monitoring technologies and extensive interpretation of multiple databases.   Critical Care Time devoted to patient care services described in this note is 85 Minutes. This time reflects time of care of this signee: Rory Percy, MD FACP. This critical care time does not reflect procedure time, or teaching time or supervisory time of PA/NP/Med student/Med Resident etc but could involve care discussion time.  Rest per NP/medical resident whose note is outlined above and that I agree with   Mcarthur Rossetti. Tyson Alias, MD, FACP Pgr: [phone] [facility] 10/13/2015 4:25 PM

## 2015-10-13 NOTE — Procedures (Signed)
Intubation Procedure Note SHELI DORIN [id number] 04-29-1961  Procedure: Intubation Indications: Respiratory insufficiency  Procedure Details Consent: Unable to obtain consent because of emergent medical necessity. Time Out: Verified patient identification, verified procedure, site/side was marked, verified correct patient position, special equipment/implants available, medications/allergies/relevent history reviewed, required imaging and test results available.  Performed  Maximum sterile technique was used including gloves, gown, hand hygiene and mask.  MAC and 3    Evaluation Hemodynamic Status: BP stable throughout; O2 sats: stable throughout Patient's Current Condition: stable Complications: No apparent complications Patient did tolerate procedure well. Chest X-ray ordered to verify placement.  CXR: pending.   Nelda Bucks 10/13/2015  Some pus on cords  Mcarthur Rossetti. Tyson Alias, MD, FACP Pgr: [phone] [facility]

## 2015-10-13 NOTE — Code Documentation (Addendum)
Tube placed 22 @ lip . Color changed noted on easy Cap. Bilateral Breathe sounds noted.

## 2015-10-13 NOTE — ED Notes (Signed)
Patient here with complaint of hyperglycemia throughout today. Hasn't been taking her insulin for 2 days. Endorses CBG readings today @ 400-500. EMS reports that CBG was reading >600.

## 2015-10-13 NOTE — Procedures (Signed)
Central Venous Catheter Insertion Procedure Note KATILYN MILTENBERGER [id number] Jul 05, 1961  Procedure: Insertion of Central Venous Catheter Indications: Assessment of intravascular volume, Drug and/or fluid administration and Frequent blood sampling  Procedure Details Consent: Unable to obtain consent because of emergent medical necessity. Time Out: Verified patient identification, verified procedure, site/side was marked, verified correct patient position, special equipment/implants available, medications/allergies/relevent history reviewed, required imaging and test results available.  Performed  Maximum sterile technique was used including antiseptics, cap, gloves, gown, hand hygiene, mask and sheet. Skin prep: Chlorhexidine; local anesthetic administered A antimicrobial bonded/coated triple lumen catheter was placed in the right internal jugular vein using the Seldinger technique.  Evaluation Blood flow good Complications: No apparent complications Patient did tolerate procedure well. Chest X-ray ordered to verify placement.  CXR: pending.  Nelda Bucks 10/13/2015, 3:27 PM  [place]  Mcarthur Rossetti. Tyson Alias, MD, FACP Pgr: [phone]  Pulmonary & Critical Care

## 2015-10-13 NOTE — ED Notes (Addendum)
Pt was found in bed covered with blood, pt confused, IV intact with tubing removed, pt actively bleeding from the site, IV was removed, pressure applied, pt continues to be combative & moves around in bed, bedside cleaning completed, pt having large formed BM, pt cleaned up, Admitting MD paged & aware, MD to bedside, IV started in pts L upper arm area, pt remains combative & requires x 4 person restraining to prevent self harm, pt reoriented, attempted verbal reassurance, pt to receive soft restraints & medication, full linen change completed, Janee, RN at bedside, pt placed back on monitor

## 2015-10-13 NOTE — ED Notes (Addendum)
Spoke with MD advised patient still combative, patient now unable to control  Bowel and bladder.  Patient now has snoring respirations.

## 2015-10-13 NOTE — ED Notes (Signed)
Critical Care paged for CBG over 400.

## 2015-10-13 NOTE — ED Notes (Signed)
MD advised patient is becoming Altered. Patient removed IV. RN placed new IV and wrapped in gauzed.

## 2015-10-13 NOTE — ED Notes (Signed)
Spoke with the admitting MD advised when to DC insulin drip. MD advised since patient did not have a gap. Contact MD once CBG at 250 to place on long active.

## 2015-10-13 NOTE — ED Notes (Signed)
Ordered lunch tray 

## 2015-10-13 NOTE — ED Notes (Signed)
Propofol verbal by [place] rate going 11.7ML/Hr.

## 2015-10-14 ENCOUNTER — Encounter (HOSPITAL_COMMUNITY): Payer: Self-pay | Admitting: Radiology

## 2015-10-14 ENCOUNTER — Inpatient Hospital Stay (HOSPITAL_COMMUNITY): Payer: Medicare HMO

## 2015-10-14 DIAGNOSIS — J9601 Acute respiratory failure with hypoxia: Secondary | ICD-10-CM | POA: Diagnosis present

## 2015-10-14 LAB — BASIC METABOLIC PANEL
ANION GAP: 14 (ref 5–15)
BUN: 18 mg/dL (ref 6–20)
CALCIUM: 7.9 mg/dL — AB (ref 8.9–10.3)
CO2: 17 mmol/L — AB (ref 22–32)
Chloride: 101 mmol/L (ref 101–111)
Creatinine, Ser: 1.24 mg/dL — ABNORMAL HIGH (ref 0.44–1.00)
GFR calc Af Amer: 56 mL/min — ABNORMAL LOW (ref >60)
GFR calc non Af Amer: 48 mL/min — ABNORMAL LOW (ref >60)
GLUCOSE: 256 mg/dL — AB (ref 65–99)
Potassium: 3.3 mmol/L — ABNORMAL LOW (ref 3.5–5.1)
Sodium: 132 mmol/L — ABNORMAL LOW (ref 135–145)

## 2015-10-14 LAB — COMPREHENSIVE METABOLIC PANEL
ALBUMIN: 2.4 g/dL — AB (ref 3.5–5.0)
ALK PHOS: 95 U/L (ref 38–126)
ALT: 21 U/L (ref 14–54)
ANION GAP: 12 (ref 5–15)
AST: 41 U/L (ref 15–41)
BUN: 20 mg/dL (ref 6–20)
CO2: 20 mmol/L — AB (ref 22–32)
Calcium: 8.3 mg/dL — ABNORMAL LOW (ref 8.9–10.3)
Chloride: 103 mmol/L (ref 101–111)
Creatinine, Ser: 1.42 mg/dL — ABNORMAL HIGH (ref 0.44–1.00)
GFR calc Af Amer: 48 mL/min — ABNORMAL LOW (ref >60)
GFR calc non Af Amer: 41 mL/min — ABNORMAL LOW (ref >60)
GLUCOSE: 259 mg/dL — AB (ref 65–99)
POTASSIUM: 3.7 mmol/L (ref 3.5–5.1)
SODIUM: 135 mmol/L (ref 135–145)
Total Bilirubin: 0.2 mg/dL — ABNORMAL LOW (ref 0.3–1.2)
Total Protein: 5.7 g/dL — ABNORMAL LOW (ref 6.5–8.1)

## 2015-10-14 LAB — CBC
HCT: 24.2 % — ABNORMAL LOW (ref 36.0–46.0)
HCT: 31.5 % — ABNORMAL LOW (ref 36.0–46.0)
HEMOGLOBIN: 8.6 g/dL — AB (ref 12.0–15.0)
Hemoglobin: 10.7 g/dL — ABNORMAL LOW (ref 12.0–15.0)
MCH: 29.5 pg (ref 26.0–34.0)
MCH: 27.7 pg (ref 26.0–34.0)
MCHC: 35.5 g/dL (ref 30.0–36.0)
MCHC: 34 g/dL (ref 30.0–36.0)
MCV: 82.9 fL (ref 78.0–100.0)
MCV: 81.6 fL (ref 78.0–100.0)
PLATELETS: 147 10*3/uL — AB (ref 150–400)
Platelets: 227 10*3/uL (ref 150–400)
RBC: 2.92 MIL/uL — ABNORMAL LOW (ref 3.87–5.11)
RBC: 3.86 MIL/uL — AB (ref 3.87–5.11)
RDW: 14.1 % (ref 11.5–15.5)
RDW: 14.2 % (ref 11.5–15.5)
WBC: 16.7 10*3/uL — AB (ref 4.0–10.5)
WBC: 11.5 10*3/uL — AB (ref 4.0–10.5)

## 2015-10-14 LAB — GLUCOSE, CAPILLARY
GLUCOSE-CAPILLARY: 269 mg/dL — AB (ref 65–99)
GLUCOSE-CAPILLARY: 235 mg/dL — AB (ref 65–99)
Glucose-Capillary: 190 mg/dL — ABNORMAL HIGH (ref 65–99)
Glucose-Capillary: 259 mg/dL — ABNORMAL HIGH (ref 65–99)
Glucose-Capillary: 334 mg/dL — ABNORMAL HIGH (ref 65–99)
Glucose-Capillary: 227 mg/dL — ABNORMAL HIGH (ref 65–99)

## 2015-10-14 LAB — PHOSPHORUS
PHOSPHORUS: 3.8 mg/dL (ref 2.5–4.6)
Phosphorus: <1 mg/dL — CL (ref 2.5–4.6)

## 2015-10-14 LAB — LACTIC ACID, PLASMA: LACTIC ACID, VENOUS: 3.4 mmol/L — AB (ref 0.5–2.0)

## 2015-10-14 LAB — TROPONIN I: TROPONIN I: 1.09 ng/mL — AB (ref <0.031)

## 2015-10-14 LAB — HEMOGLOBIN A1C
Hgb A1c MFr Bld: 12.6 % — ABNORMAL HIGH (ref 4.8–5.6)
MEAN PLASMA GLUCOSE: 315 mg/dL

## 2015-10-14 LAB — MAGNESIUM
MAGNESIUM: 2.2 mg/dL (ref 1.7–2.4)
Magnesium: 1.5 mg/dL — ABNORMAL LOW (ref 1.7–2.4)

## 2015-10-14 LAB — MRSA PCR SCREENING: MRSA BY PCR: NEGATIVE

## 2015-10-14 MED ORDER — ACETAMINOPHEN 160 MG/5ML PO SOLN
650.0000 mg | ORAL | Status: DC | PRN
  Administered 2015-10-14 (×2): 650 mg
  Filled 2015-10-14 (×2): qty 20.3

## 2015-10-14 MED ORDER — DEXTROSE 5 % IV SOLN
30.0000 mmol | Freq: Once | INTRAVENOUS | Status: AC
  Administered 2015-10-14: 30 mmol via INTRAVENOUS
  Filled 2015-10-14: qty 10

## 2015-10-14 MED ORDER — PIPERACILLIN-TAZOBACTAM 3.375 G IVPB
3.3750 g | Freq: Three times a day (TID) | INTRAVENOUS | Status: DC
  Administered 2015-10-14 – 2015-10-18 (×14): 3.375 g via INTRAVENOUS
  Filled 2015-10-14 (×15): qty 50

## 2015-10-14 MED ORDER — SODIUM CHLORIDE 0.9% FLUSH: 10.0000 mL | INTRAVENOUS | Status: DC | PRN

## 2015-10-14 MED ORDER — SODIUM CHLORIDE 0.9% FLUSH
10.0000 mL | Freq: Two times a day (BID) | INTRAVENOUS | Status: DC
  Administered 2015-10-15 – 2015-10-16 (×2): 10 mL
  Administered 2015-10-17: 20 mL
  Administered 2015-10-17 – 2015-10-18 (×3): 10 mL
  Administered 2015-10-19: 30 mL

## 2015-10-14 MED ORDER — MAGNESIUM SULFATE 2 GM/50ML IV SOLN
2.0000 g | Freq: Once | INTRAVENOUS | Status: AC
Start: 2015-10-14 — End: 2015-10-14
  Administered 2015-10-14: 2 g via INTRAVENOUS
  Filled 2015-10-14: qty 50

## 2015-10-14 MED ORDER — ANTISEPTIC ORAL RINSE SOLUTION (CORINZ): 7.0000 mL | OROMUCOSAL | Status: DC

## 2015-10-14 MED ORDER — ANTISEPTIC ORAL RINSE SOLUTION (CORINZ)
7.0000 mL | OROMUCOSAL | Status: DC
  Administered 2015-10-14 – 2015-10-18 (×45): 7 mL via OROMUCOSAL

## 2015-10-14 MED ORDER — CHLORHEXIDINE GLUCONATE 0.12% ORAL RINSE (MEDLINE KIT)
15.0000 mL | Freq: Two times a day (BID) | OROMUCOSAL | Status: DC
  Administered 2015-10-14 – 2015-10-18 (×8): 15 mL via OROMUCOSAL

## 2015-10-14 MED ORDER — VANCOMYCIN HCL IN DEXTROSE 750-5 MG/150ML-% IV SOLN
750.0000 mg | Freq: Two times a day (BID) | INTRAVENOUS | Status: DC
  Administered 2015-10-14 – 2015-10-15 (×4): 750 mg via INTRAVENOUS
  Filled 2015-10-14 (×5): qty 150

## 2015-10-14 NOTE — Progress Notes (Signed)
eLink Physician-Brief Progress Note Patient Name: [name] DOB: 21-May-1961 MRN: [id number]   Date of Service  10/14/2015  HPI/Events of Note  Temp 104. Pt on vanco, zosyn  eICU Interventions  Tylenol and cooling blanket.     Intervention Category Intermediate Interventions: Other:  Shalissa Easterwood 10/14/2015, 12:25 AM

## 2015-10-14 NOTE — Progress Notes (Signed)
Initial Nutrition Assessment  DOCUMENTATION CODES:   Obesity unspecified  INTERVENTION:   Recommend initiating enteral nutrition when medically stable if patient is unable to be extubated: Vital High Protein at goal rate of 30 mL/hr via OG tube.  Provide 30 mL Prostat TID.    Tube feeding regimen would provide 1020 kcals, 108 grams of protein, and 602 mL of water.  Tube feeding and Propofol regimen would provide a total of 1485 kcal (15 kcal/kg of actual weight).  NUTRITION DIAGNOSIS:   Inadequate oral intake related to inability to eat as evidenced by NPO status.  GOAL:   Provide needs based on ASPEN/SCCM guidelines  MONITOR:   Vent status, Labs, Skin  REASON FOR ASSESSMENT:   Ventilator    ASSESSMENT:   55 yo female with hx of DM, PVD, s/p R BKA, HTN, CHF, OSA on CPAP. Presented with altered mental state, saying that she is "hearing stuff". Pt states that her home glucose ranges from 300-400. Pt's only complaint at this time are her high sugars and she occasionally has to pee but states that this is normal. On 2/22 pt was emergently intubated due to severe distress and altered mental status.     Patient is currently intubated on ventilator support. MV: 17.4 mL/min Temp (24 hrs), Avg: 102.1 F (39 C), Min: 99.7 F (37.6 C), Max: 104 F (40 C) OG Tube, tip in the proximal stomach with low intermittent suction Propofol: 17.6 mL/hr, provides 465 kcals from lipid per day  Nutrition Focused Physical Exam was conducted.  Findings include no fat depletion, no muscle depletion, and no edema.   Medications reviewed and included: novolog.  Labs reviewed and included: CBGs 259-334, sodium 135, phosphorus <1.0, magnesium 1.5.  Episodes of diarrhea, CT of abdomen negative, C Diff pending.  Diet Order:  Diet NPO time specified  Skin:  Wound (see comment) (Diabetic foot ulcer)  Last BM:  2/22  Height:   Ht Readings from Last 1 Encounters:  10/13/15 5\' 5"  (1.651 m)     Weight:   Wt Readings from Last 1 Encounters:  10/14/15 215 lb 6.2 oz (97.7 kg)    Ideal Body Weight:  53.1 kg  BMI:  Body mass index is 35.84 kg/(m^2).  Estimated Nutritional Needs:   Kcal:  10/16/15  Protein:  >/= 106 grams  Fluid:  >/= 2L  EDUCATION NEEDS:   No education needs identified at this time  [phone], Dietetic Intern Pager: [phone]

## 2015-10-14 NOTE — Progress Notes (Signed)
eLink Physician-Brief Progress Note Patient Name: [name] DOB: 1960-09-22 MRN: [id number]   Date of Service  10/14/2015  HPI/Events of Note  Phos <1  eICU Interventions  Replete. Recheck labs later today.     Intervention Category Intermediate Interventions: Electrolyte abnormality - evaluation and management  Raiya Stainback 10/14/2015, 6:50 AM

## 2015-10-14 NOTE — Progress Notes (Signed)
PT Cancellation Note  Patient Details Name: [name] MRN: [id number] DOB: 27-May-1961   Cancelled Treatment:    Reason Eval/Treat Not Completed: Patient not medically ready per nursing, intubated.   Fabio Asa 10/14/2015, 9:36 AM Charlotte Crumb, PT DPT  [phone]'

## 2015-10-14 NOTE — Consult Note (Signed)
PULMONARY / CRITICAL CARE MEDICINE   Name: [name] MRN: [id number] DOB: 1960/09/02    ADMISSION DATE:  10/13/2015 CONSULTATION DATE:  2/22  REFERRING MD:  FPTS   CHIEF COMPLAINT:  AMS, lactic acidosis, HHNK   BRIEF 54yo female with multiple medical problems including DM with PVD s/p R BKA, HTN, dCHF, CKD 2, GERD, RA, COPD, OSA on CPAP. She presented 2/22 with AMS endorsing auditory hallucinations and increased blood glucose. Her blood sugar was noted to be 939 on admission without ketones or acidosis. Initial w/u also revealed hyponatremia and cute on CKD. She was started on IV insulin gtt and given fluids. She was awaiting admission when she became increasingly confused, combative and a f/u lactic acid was elevated at 5 and PCCM consulted to assess for ICU admission.    On Dr. Gwendolyn Grant arrival pt was in acute distress, RR 50's, desatting.  Intubated urgently.     STUDIES:  CT head 2/22>>> neg acute (motion degraded)  2D echo 2/22>>>  CULTURES: BC x2 2/22>>> Urine 2/22>>>  Results for orders placed or performed during the hospital encounter of 10/13/15  MRSA PCR Screening     Status: None   Collection Time: 10/13/15  7:44 PM  Result Value Ref Range Status   MRSA by PCR NEGATIVE NEGATIVE Final    Comment:        The GeneXpert MRSA Assay (FDA approved for NASAL specimens only), is one component of a comprehensive MRSA colonization surveillance program. It is not intended to diagnose MRSA infection nor to guide or monitor treatment for MRSA infections.      ANTIBIOTICS: Vanc 2/22>>> Zosyn 2/22>>>  SIGNIFICANT EVENTS:   LINES/TUBES: ETT 2/22>>> R IJ CVL 2/22>>>  SUBJECTIVE:   10/14/15 - not on pressors. On cooling blanet. On diprivan + fent PRN - wau gets agitated but does not follow commands. On C Diff precaution but no diarrhea yet. Making urine. No family at bedsie.  40% fio2  VITAL SIGNS: BP 139/49 mmHg  Pulse 73  Temp(Src) 100.2 F  (37.9 C) (Rectal)  Resp 29  Ht 5\' 5"  (1.651 m)  Wt 97.7 kg (215 lb 6.2 oz)  BMI 35.84 kg/m2  SpO2 100%  HEMODYNAMICS: CVP:  [14 mmHg-15 mmHg] 14 mmHg  VENTILATOR SETTINGS: Vent Mode:  [-] PRVC FiO2 (%):  [40 %-100 %] 40 % Set Rate:  [22 bmp-35 bmp] 35 bmp Vt Set:  [500 mL] 500 mL PEEP:  [5 cmH20] 5 cmH20 Plateau Pressure:  [18 cmH20-24 cmH20] 20 cmH20  INTAKE / OUTPUT: I/O last 3 completed shifts: In: 2087.9 [I.V.:1787.9; IV Piggyback:300] Out: 950 [Urine:950]  PHYSICAL EXAMINATION: General:  Chronically and critically ill appearing female, NAD post intubation  Neuro:  Sedated post intubation  HEENT:  Mm moist, ETT, R IJ CVL c/d  Cardiovascular:  s1s2 rrr Lungs:  Distant, resps even non labored on vent  Abdomen:  Round, soft, non distended, non tender per RN Musculoskeletal:  Warm and dry, R BKA, ulceration L foot   LABS:  PULMONARY  Recent Labs Lab 10/13/15 0844 10/13/15 1452 10/13/15 1535  PHART 7.310* 7.246* 7.212*  PCO2ART 52.1* 51.1* 59.3*  PO2ART 81.0 57.0* 328.0*  HCO3 26.2* 22.2 23.8  TCO2 28 24 26   O2SAT 95.0 84.0 100.0    CBC  Recent Labs Lab 10/12/15 2142 10/13/15 1538 10/14/15 0500  HGB 13.9 13.0 8.6*  HCT 42.0 38.8 24.2*  WBC 6.3  --  16.7*  PLT 171  --  227  COAGULATION No results for input(s): INR in the last 168 hours.  CARDIAC   Recent Labs Lab 10/13/15 2050  TROPONINI 4.15*   No results for input(s): PROBNP in the last 168 hours.   CHEMISTRY  Recent Labs Lab 10/12/15 2142 10/13/15 1530 10/14/15 0500  NA 128* 136 135  K 5.0 4.3 3.7  CL 93* 100* 103  CO2 20* 18* 20*  GLUCOSE 939* 461* 259*  BUN 22* 18 20  CREATININE 1.48* 1.26* 1.42*  CALCIUM 8.9 8.8* 8.3*  MG  --   --  1.5*  PHOS  --   --  <1.0*   Estimated Creatinine Clearance: 52.4 mL/min (by C-G formula based on Cr of 1.42).   LIVER  Recent Labs Lab 10/14/15 0500  AST 41  ALT 21  ALKPHOS 95  BILITOT 0.2*  PROT 5.7*  ALBUMIN 2.4*      INFECTIOUS  Recent Labs Lab 10/13/15 1510 10/13/15 1538 10/13/15 2050  LATICACIDVEN 5.88* 4.2* 4.7*  PROCALCITON  --  <0.10  --      ENDOCRINE CBG (last 3)   Recent Labs  10/14/15 0327 10/14/15 0759 10/14/15 1137  GLUCAP 269* 259* 334*         IMAGING x48h  - image(s) personally visualized  -   highlighted in bold Ct Abdomen Pelvis Wo Contrast  10/14/2015  CLINICAL DATA:  Lactate acid acidosis. EXAM: CT ABDOMEN AND PELVIS WITHOUT CONTRAST TECHNIQUE: Multidetector CT imaging of the abdomen and pelvis was performed following the standard protocol without IV contrast. COMPARISON:  CT 08/16/2013 FINDINGS: Lower chest: The heart is enlarged. There are linear bibasilar opacities, left greater than right. No pleural effusion. Liver: Hepatic steatosis. No focal lesion. No pneumobilia or portal venous gas. Hepatobiliary: Gallbladder physiologically distended, no calcified stone. No biliary dilatation. Pancreas: No ductal dilatation or inflammation. Spleen: Normal. Adrenal glands: 12 mm hypodense nodule from the right adrenal gland consistent with benign adenoma, unchanged. Left adrenal gland is normal. Kidneys: No hydronephrosis. Minimal left ureteral prominence without ribs urolithiasis. No ureteral calculus. Mild nonspecific perinephric stranding. Stomach/Bowel: Stomach physiologically distended. There are no dilated or thickened small bowel loops. Small volume of stool throughout the colon without colonic wall thickening. No findings to suggest bowel ischemia. Colonic diverticulosis distally without diverticulitis. The appendix is normal. Vascular/Lymphatic: No retroperitoneal adenopathy. Small right common iliac node measures 8 mm short axis, nonspecific. Prominent inguinal nodes are unchanged. Abdominal aorta is normal in caliber. Mild atherosclerosis without aneurysm. Reproductive: Uterus and adnexa normal for age. Bladder: Decompressed by Foley catheter. Other: No free air, free  fluid, or intra-abdominal fluid collection. Sebaceous cyst in the posterior left flank soft tissues is unchanged. Musculoskeletal: There are no acute or suspicious osseous abnormalities. IMPRESSION: 1. No acute abnormality in the abdomen/pelvis or findings to explain lactic acidosis. 2. Right adrenal adenoma, stable from prior. Electronically Signed   By: Rubye Oaks M.D.   On: 10/14/2015 03:11   Ct Head Wo Contrast  10/13/2015  CLINICAL DATA:  Acute encephalopathy. Altered mental status. Hallucinations. EXAM: CT HEAD WITHOUT CONTRAST TECHNIQUE: Contiguous axial images were obtained from the base of the skull through the vertex without intravenous contrast. COMPARISON:  10/01/2015 FINDINGS: Study is degraded by motion. Allowing for that, there is no evidence of old or acute infarction, mass lesion, hemorrhage, hydrocephalus or extra-axial collection. Sinuses are clear. No calvarial abnormality. IMPRESSION: Motion degraded study.  Normal examination allowing for that. Electronically Signed   By: Paulina Fusi M.D.   On: 10/13/2015 14:21  Dg Chest Port 1 View  10/13/2015  CLINICAL DATA:  Intubated EXAM: PORTABLE CHEST 1 VIEW COMPARISON:  08/25/2014 FINDINGS: Endotracheal tube has its tip 2.5 cm above the carina. Nasogastric tube enters the stomach. Right internal jugular central line has its tip in the SVC above the right atrium. Dual lead pacemaker has leads in the region of the right atrium and right ventricle. There is my normal atelectasis at the lung bases. No pulmonary edema. No consolidation. IMPRESSION: Lines and tubes well positioned.  Minimal basilar atelectasis. Electronically Signed   By: Paulina Fusi M.D.   On: 10/13/2015 16:08   Dg Abd Portable 1v  10/13/2015  CLINICAL DATA:  Nasogastric catheter placement EXAM: PORTABLE ABDOMEN - 1 VIEW COMPARISON:  10/13/2015 FINDINGS: Nasogastric catheter is again seen but now directed towards the distal stomach. Scattered large and small bowel gas is  noted. IMPRESSION: Nasogastric catheter as described. Electronically Signed   By: Alcide Clever M.D.   On: 10/13/2015 21:30   Dg Abd Portable 1v  10/13/2015  CLINICAL DATA:  Orogastric tube placement. EXAM: PORTABLE ABDOMEN - 1 VIEW COMPARISON:  Earlier today. FINDINGS: Orogastric tube tip in the proximal stomach. Normal bowel gas pattern. Thoracolumbar spine degenerative changes and mild scoliosis. IMPRESSION: Orogastric tube tip in the proximal stomach. Electronically Signed   By: Beckie Salts M.D.   On: 10/13/2015 19:17   Dg Abd Portable 1v  10/13/2015  CLINICAL DATA:  OG tube placement. EXAM: PORTABLE ABDOMEN - 1 VIEW COMPARISON:  Earlier the same day. FINDINGS: 1811 hours. There is no evidence for OG tube overlying the lower mediastinum or upper abdomen. Bowel gas pattern is nonspecific. IMPRESSION: No evidence for OG tube over the lower chest or upper abdomen. Consider chest x-ray to assess tube position or tube removal and replacement These results will be called to the ordering clinician or representative by the Radiologist Assistant, and communication documented in the PACS or zVision Dashboard. Electronically Signed   By: Kennith Center M.D.   On: 10/13/2015 18:36   Dg Abd Portable 1v  10/13/2015  CLINICAL DATA:  Orogastric tube placement EXAM: PORTABLE ABDOMEN - 1 VIEW COMPARISON:  None. FINDINGS: Orogastric tube tip is in the mid body of the stomach. Side hole is just pass the gastroesophageal junction. IMPRESSION: Orogastric tube tip in the mid body of the stomach. Electronically Signed   By: Paulina Fusi M.D.   On: 10/13/2015 16:09        DISCUSSION:   ASSESSMENT / PLAN:  PULMONARY Acute respiratory failure - r/t AMS, lactic acidosis Hx COPD   - does not meet extubation criteria P:   Vent support - 8cc/kg  F/u CXR  F/u ABG PRN BD   CARDIOVASCULAR Hx HTN and diast dysfn in 2015  Troponin elevation - mild     - not on presssors P:  Trend troponin  - recheck 3pm 2D  echo  Check CVP  Hold home lasix  RENAL Acute on CKD (aseline 1.1-1.3) Hyponatremia   - Creat just above baseline. Low phos repleted. Low Mag +   P:   replet mag F/u chem  Gentle volume  F/u lactate now  NS @ 150ml/hr   GASTROINTESTINAL Diarrhea . CT abd neg   - none 10/14/15. CT P:   Check CDiff  If no stool by end of 10/14/15 can dc isolation   HEMATOLOGIC New anemia 10/14/15 - No active bleeding but big drop in 24h  P:  Sq heparin  F/u CBC -  recheck 3pm  INFECTIOUS  Recent Labs Lab 10/13/15 1510 10/13/15 1538 10/13/15 2050  LATICACIDVEN 5.88* 4.2* 4.7*  PROCALCITON  --  <0.10  --     No clear source for infection but unknown cause of elevated lactate  P:   Pan culture  Empiric abx as above  Pct  Trend lactate -recheck 3pm  ENDOCRINE DM2 HHNK - no acidosis, neg ketones   Hypothyroid  P:   Hold insulin gtt for now  SSI alone for now  Check tsh Cont home synthroid   NEUROLOGIC AMS -  Likely metabolic encephalopathy.   P:   RASS goal: -1 Propofol gtt    FAMILY  - Updates:  No family available 2/22 and 10/14/15   Global -recheck trop, lactate at 3pm. Await echo,  The patient is critically ill with multiple organ systems failure and requires high complexity decision making for assessment and support, frequent evaluation and titration of therapies, application of advanced monitoring technologies and extensive interpretation of multiple databases.   Critical Care Time devoted to patient care services described in this note is  30  Minutes. This time reflects time of care of this signee Dr Kalman Shan. This critical care time does not reflect procedure time, or teaching time or supervisory time of PA/NP/Med student/Med Resident etc but could involve care discussion time    Dr. Kalman Shan, M.D., [facility].C.P Pulmonary and Critical Care Medicine Staff Physician [facility] [place] [facility] Pager: [phone], If  no answer or between  15:00h - 7:00h: call [phone]  10/14/2015 1:04 PM

## 2015-10-14 NOTE — Progress Notes (Signed)
Pharmacy Antibiotic Note  [name] is a 55 y.o. female admitted on 10/13/2015 with sepsis.  Pharmacy has been consulted for vancomycin and zosyn dosing. Pt presented with AMS, lactic acidosis and HHNK.  Plan: Vancomycin 750mg  IV every 12 hours.  Goal trough 15-20 mcg/mL. Zosyn 3.375g IV q8h (4 hour infusion).  Monitor culture data, renal function and clinical course VT at SS prn  Height: 5\' 5"  (165.1 cm) Weight: 216 lb (97.977 kg) IBW/kg (Calculated) : 57  Temp (24hrs), Avg:100.6 F (38.1 C), Min:98.2 F (36.8 C), Max:104 F (40 C)   Recent Labs Lab 10/12/15 2142 10/13/15 0953 10/13/15 1426 10/13/15 1510 10/13/15 1530 10/13/15 1538 10/13/15 2050  WBC 6.3  --   --   --   --   --   --   CREATININE 1.48*  --   --   --  1.26*  --   --   LATICACIDVEN  --  1.9 5.17* 5.88*  --  4.2* 4.7*    Estimated Creatinine Clearance: 59.1 mL/min (by C-G formula based on Cr of 1.26).    Allergies  Allergen Reactions  . Erythromycin Diarrhea  . Iodine-131 Nausea And Vomiting  . Gadolinium Nausea And Vomiting     Code: VOM, Desc: Pt began vomiting immed post infusion of multihance, Onset Date: 10/15/15   . Ivp Dye [Iodinated Diagnostic Agents] Nausea And Vomiting    Nausea and vomiting   . Metformin Diarrhea     diarrhea  . Penicillins Hives    Has patient had a PCN reaction causing immediate rash, facial/tongue/throat swelling, SOB or lightheadedness with hypotension: No Has patient had a PCN reaction causing severe rash involving mucus membranes or skin necrosis: No Has patient had a PCN reaction that required hospitalization No Has patient had a PCN reaction occurring within the last 10 years: No If all of the above answers are "NO", then may proceed with Cephalosporin use.     Antimicrobials this admission: Vanc 2/23 >>  Zosyn 2/23 >>   Dose adjustments this admission: n/a  Microbiology results: 2/22 BCx: sent 2/22 UCx: sent   Sputum:   2/22 MRSA PCR:  neg   3/22. 3/22, PharmD, BCPS Clinical Pharmacist Pager [phone] 10/14/2015 12:35 AM

## 2015-10-14 NOTE — Progress Notes (Signed)
Echocardiogram 2D Echocardiogram has been performed.  [name] 10/14/2015, 2:49 PM

## 2015-10-14 NOTE — Progress Notes (Signed)
CRITICAL VALUE ALERT  Critical value received:  P+<1  Date of notification:  10/14/2015  Time of notification:  0649  Critical value read back:Yes.    Nurse who received alert:  Shary Decamp  MD notified (1st page):  ELink  Time of first page:  [phone]  MD notified (2nd page):  Time of second page:  Responding MD:  Pola Corn Dr. Colbert Coyer  Time MD responded:  [phone]

## 2015-10-14 NOTE — Progress Notes (Signed)
OT Cancellation Note  Patient Details Name: [name] MRN: [id number] DOB: May 17, 1961   Cancelled Treatment:    Reason Eval/Treat Not Completed: Patient not medically ready Patient not medically ready per nursing, intubated.  Evette Georges [phone] 10/14/2015, 9:44 AM

## 2015-10-14 NOTE — Progress Notes (Signed)
Utilization review completed. Othar Curto, RN, BSN. 

## 2015-10-14 NOTE — Progress Notes (Signed)
CRITICAL VALUE ALERT  Critical value received:  Lactic Acid 3.4  Date of notification:  10/14/2015  Time of notification:  1640  Critical value read back:Yes.    Nurse who received alert:  Agatha Duplechain, Casilda Carls  Value is less than previous reported.

## 2015-10-14 NOTE — Progress Notes (Signed)
Inpatient Diabetes Program Recommendations  AACE/ADA: New Consensus Statement on Inpatient Glycemic Control (2015)  Target Ranges:  Prepandial:   less than 140 mg/dL      Peak postprandial:   less than 180 mg/dL (1-2 hours)      Critically ill patients:  140 - 180 mg/dL   Review of Glycemic Control:  Results for GAZELLE, TOWE (MRN [id number]) as of 10/14/2015 11:00  Ref. Range 10/13/2015 17:12 10/13/2015 20:11 10/13/2015 23:31 10/14/2015 03:27 10/14/2015 07:59  Glucose-Capillary Latest Ref Range: 65-99 mg/dL 539 (H) 767 (H) 341 (H) 269 (H) 259 (H)   Diabetes history:  Outpatient Diabetes medications: Levemir 90 units daily, Novolog 13 units tid with meals Current orders for Inpatient glycemic control:  Novolog moderate q 4 hours  Inpatient Diabetes Program Recommendations:    Please consider adding ICU glycemic control protocol.  Patient was on large dose of basal insulin prior to admission and likely will not be controlled on only SQ correction insulin.  Thanks, Beryl Meager, RN, BC-ADM Inpatient Diabetes Coordinator Pager [phone]

## 2015-10-14 NOTE — Progress Notes (Signed)
Per report pt had formed BM today and lab rejected sample due to it being formed. Dr. Tona Sensing for CCM made aware. Order to d/c contact precautions.

## 2015-10-15 ENCOUNTER — Inpatient Hospital Stay (HOSPITAL_COMMUNITY): Payer: Medicare HMO

## 2015-10-15 LAB — TROPONIN I: TROPONIN I: 0.48 ng/mL — AB (ref <0.031)

## 2015-10-15 LAB — URINE CULTURE: CULTURE: NO GROWTH

## 2015-10-15 LAB — GLUCOSE, CAPILLARY
GLUCOSE-CAPILLARY: 201 mg/dL — AB (ref 65–99)
GLUCOSE-CAPILLARY: 147 mg/dL — AB (ref 65–99)
GLUCOSE-CAPILLARY: 191 mg/dL — AB (ref 65–99)
GLUCOSE-CAPILLARY: 183 mg/dL — AB (ref 65–99)
Glucose-Capillary: 181 mg/dL — ABNORMAL HIGH (ref 65–99)
Glucose-Capillary: 154 mg/dL — ABNORMAL HIGH (ref 65–99)
Glucose-Capillary: 186 mg/dL — ABNORMAL HIGH (ref 65–99)

## 2015-10-15 LAB — CK TOTAL AND CKMB (NOT AT ARMC)
CK TOTAL: 344 U/L — AB (ref 38–234)
CK, MB: 3.2 ng/mL (ref 0.5–5.0)
Relative Index: 0.9 (ref 0.0–2.5)

## 2015-10-15 LAB — LACTIC ACID, PLASMA: Lactic Acid, Venous: 2.2 mmol/L (ref 0.5–2.0)

## 2015-10-15 LAB — PROCALCITONIN: PROCALCITONIN: 0.67 ng/mL

## 2015-10-15 MED ORDER — DEXTROSE-NACL 5-0.45 % IV SOLN
INTRAVENOUS | Status: DC
  Administered 2015-10-15: 14:00:00 via INTRAVENOUS

## 2015-10-15 NOTE — Progress Notes (Signed)
PT Cancellation Note  Patient Details Name: [name] MRN: [id number] DOB: 02/24/1961   Cancelled Treatment:    Reason Eval/Treat Not Completed: Patient not medically ready, remains sedated on ventilator support   Fabio Asa 10/15/2015, 7:22 AM Charlotte Crumb, PT DPT  [phone]

## 2015-10-15 NOTE — Progress Notes (Signed)
Critical Lactic Acid 2.2 called to Dr. Bea Laura.  Deterding.

## 2015-10-15 NOTE — Progress Notes (Signed)
OT Cancellation Note  Patient Details Name: [name] MRN: [id number] DOB: 1961-02-13   Cancelled Treatment:    Reason Eval/Treat Not Completed: Patient not medically ready Currently on VENT  Felecia Shelling   OTR/L Pager: [phone] Office: [phone] .  10/15/2015, 7:29 AM

## 2015-10-15 NOTE — Consult Note (Signed)
PULMONARY / CRITICAL CARE MEDICINE   Name: [name] MRN: [id number] DOB: 08-23-1960    ADMISSION DATE:  10/13/2015 CONSULTATION DATE:  2/22  REFERRING MD:  FPTS   CHIEF COMPLAINT:  AMS, lactic acidosis, HHNK   BRIEF 54yo female with multiple medical problems including DM with PVD s/p R BKA, HTN, dCHF, CKD 2, GERD, RA, COPD, OSA on CPAP. She presented 2/22 with AMS endorsing auditory hallucinations and increased blood glucose. Her blood sugar was noted to be 939 on admission without ketones or acidosis. Initial w/u also revealed hyponatremia and cute on CKD. She was started on IV insulin gtt and given fluids. She was awaiting admission when she became increasingly confused, combative and a f/u lactic acid was elevated at 5 and PCCM consulted to assess for ICU admission.    On Dr. Gwendolyn Grant arrival pt was in acute distress, RR 50's, desatting.  Intubated urgently.     STUDIES:  CT head 2/22>>> neg acute (motion degraded)  2D echo 2/22>>>  CULTURES: BC x2 2/22>>> Urine 2/22>>>  Results for orders placed or performed during the hospital encounter of 10/13/15  Culture, blood (routine x 2)     Status: None (Preliminary result)   Collection Time: 10/13/15  9:45 AM  Result Value Ref Range Status   Specimen Description BLOOD LEFT HAND  Final   Special Requests BOTTLES DRAWN AEROBIC AND ANAEROBIC  Final   Culture NO GROWTH 1 DAY  Final   Report Status PENDING  Incomplete  Culture, blood (routine x 2)     Status: None (Preliminary result)   Collection Time: 10/13/15  9:50 AM  Result Value Ref Range Status   Specimen Description BLOOD LEFT FOREARM  Final   Special Requests BOTTLES DRAWN AEROBIC AND ANAEROBIC  Final   Culture NO GROWTH 1 DAY  Final   Report Status PENDING  Incomplete  MRSA PCR Screening     Status: None   Collection Time: 10/13/15  7:44 PM  Result Value Ref Range Status   MRSA by PCR NEGATIVE NEGATIVE Final    Comment:        The GeneXpert  MRSA Assay (FDA approved for NASAL specimens only), is one component of a comprehensive MRSA colonization surveillance program. It is not intended to diagnose MRSA infection nor to guide or monitor treatment for MRSA infections.      ANTIBIOTICS: Vanc 2/22>>>2/24 Zosyn 2/22>>>  LINES/TUBES: ETT 2/22>>> R IJ CVL 2/22>>>  EVENTS  10/14/15 - not on pressors. On cooling blanet. On diprivan + fent PRN - wau gets agitated but does not follow commands. On C Diff precaution but no diarrhea yet. Making urine. No family at bedsie.  40% fio2    SUBJECTIVE/OVERNIGHT/INTERVAL HX 10/15/15 - creat better. Making urine. Trop and lactate normalizing. LVEF 55% - unclear about wall motion abnormality. Brother shane at [employer] - reports chronic heavy smoker, chronic dysphagia and hallcinations and drinking 1 gallon of tea pror to admit with blood sugar 900 at home prior to EMS. He denies her being on psych meds Fever curve improving - on cooling blanket.   VITAL SIGNS: BP 116/56 mmHg  Pulse 70  Temp(Src) 97 F (36.1 C) (Rectal)  Resp 35  Ht 5\' 5"  (1.651 m)  Wt 99 kg (218 lb 4.1 oz)  BMI 36.32 kg/m2  SpO2 100%  HEMODYNAMICS: CVP:  [6 mmHg-9 mmHg] 6 mmHg  VENTILATOR SETTINGS: Vent Mode:  [-] PRVC FiO2 (%):  [40 %] 40 % Set Rate:  [  35 bmp] 35 bmp Vt Set:  [500 mL] 500 mL PEEP:  [5 cmH20] 5 cmH20 Plateau Pressure:  [20 cmH20-23 cmH20] 21 cmH20  INTAKE / OUTPUT: I/O last 3 completed shifts: In: 6539.6 [I.V.:5299.6; NG/GT:80; IV Piggyback:1160] Out: 4950 [Urine:4600; Emesis/NG output:350]  PHYSICAL EXAMINATION: General:  Chronically and critically ill appearing female, NAD post intubation  Neuro:  Sedated post intubation . Easily agitated per RN on WUA HEENT:  Mm moist, ETT, R IJ CVL c/d  Cardiovascular:  s1s2 rrr Lungs:  Distant, resps even non labored on vent  Abdomen:  Round, soft, non distended, non tender per RN Musculoskeletal:  Warm and dry, R BKA, ulceration L foot    LABS:  PULMONARY  Recent Labs Lab 10/13/15 0844 10/13/15 1452 10/13/15 1535  PHART 7.310* 7.246* 7.212*  PCO2ART 52.1* 51.1* 59.3*  PO2ART 81.0 57.0* 328.0*  HCO3 26.2* 22.2 23.8  TCO2 28 24 26   O2SAT 95.0 84.0 100.0    CBC  Recent Labs Lab 10/12/15 2142 10/13/15 1538 10/14/15 0500 10/14/15 1529  HGB 13.9 13.0 8.6* 10.7*  HCT 42.0 38.8 24.2* 31.5*  WBC 6.3  --  16.7* 11.5*  PLT 171  --  227 147*    COAGULATION No results for input(s): INR in the last 168 hours.  CARDIAC    Recent Labs Lab 10/13/15 2050 10/14/15 1529 10/15/15 0522  TROPONINI 4.15* 1.09* 0.48*   No results for input(s): PROBNP in the last 168 hours.   CHEMISTRY  Recent Labs Lab 10/12/15 2142 10/13/15 1530 10/14/15 0500 10/14/15 1529  NA 128* 136 135 132*  K 5.0 4.3 3.7 3.3*  CL 93* 100* 103 101  CO2 20* 18* 20* 17*  GLUCOSE 939* 461* 259* 256*  BUN 22* 18 20 18   CREATININE 1.48* 1.26* 1.42* 1.24*  CALCIUM 8.9 8.8* 8.3* 7.9*  MG  --   --  1.5* 2.2  PHOS  --   --  <1.0* 3.8   Estimated Creatinine Clearance: 60.4 mL/min (by C-G formula based on Cr of 1.24).   LIVER  Recent Labs Lab 10/14/15 0500  AST 41  ALT 21  ALKPHOS 95  BILITOT 0.2*  PROT 5.7*  ALBUMIN 2.4*     INFECTIOUS  Recent Labs Lab 10/13/15 1538 10/13/15 2050 10/14/15 1529 10/15/15 0008 10/15/15 0522  LATICACIDVEN 4.2* 4.7* 3.4* 2.2*  --   PROCALCITON <0.10  --   --   --  0.67     ENDOCRINE CBG (last 3)   Recent Labs  10/15/15 0401 10/15/15 0835 10/15/15 1146  GLUCAP 181* 147* 154*         IMAGING x48h  - image(s) personally visualized  -   highlighted in bold Ct Abdomen Pelvis Wo Contrast  10/14/2015  CLINICAL DATA:  Lactate acid acidosis. EXAM: CT ABDOMEN AND PELVIS WITHOUT CONTRAST TECHNIQUE: Multidetector CT imaging of the abdomen and pelvis was performed following the standard protocol without IV contrast. COMPARISON:  CT 08/16/2013 FINDINGS: Lower chest: The heart is  enlarged. There are linear bibasilar opacities, left greater than right. No pleural effusion. Liver: Hepatic steatosis. No focal lesion. No pneumobilia or portal venous gas. Hepatobiliary: Gallbladder physiologically distended, no calcified stone. No biliary dilatation. Pancreas: No ductal dilatation or inflammation. Spleen: Normal. Adrenal glands: 12 mm hypodense nodule from the right adrenal gland consistent with benign adenoma, unchanged. Left adrenal gland is normal. Kidneys: No hydronephrosis. Minimal left ureteral prominence without ribs urolithiasis. No ureteral calculus. Mild nonspecific perinephric stranding. Stomach/Bowel: Stomach physiologically distended. There are  no dilated or thickened small bowel loops. Small volume of stool throughout the colon without colonic wall thickening. No findings to suggest bowel ischemia. Colonic diverticulosis distally without diverticulitis. The appendix is normal. Vascular/Lymphatic: No retroperitoneal adenopathy. Small right common iliac node measures 8 mm short axis, nonspecific. Prominent inguinal nodes are unchanged. Abdominal aorta is normal in caliber. Mild atherosclerosis without aneurysm. Reproductive: Uterus and adnexa normal for age. Bladder: Decompressed by Foley catheter. Other: No free air, free fluid, or intra-abdominal fluid collection. Sebaceous cyst in the posterior left flank soft tissues is unchanged. Musculoskeletal: There are no acute or suspicious osseous abnormalities. IMPRESSION: 1. No acute abnormality in the abdomen/pelvis or findings to explain lactic acidosis. 2. Right adrenal adenoma, stable from prior. Electronically Signed   By: Rubye Oaks M.D.   On: 10/14/2015 03:11   Ct Head Wo Contrast  10/13/2015  CLINICAL DATA:  Acute encephalopathy. Altered mental status. Hallucinations. EXAM: CT HEAD WITHOUT CONTRAST TECHNIQUE: Contiguous axial images were obtained from the base of the skull through the vertex without intravenous contrast.  COMPARISON:  10/01/2015 FINDINGS: Study is degraded by motion. Allowing for that, there is no evidence of old or acute infarction, mass lesion, hemorrhage, hydrocephalus or extra-axial collection. Sinuses are clear. No calvarial abnormality. IMPRESSION: Motion degraded study.  Normal examination allowing for that. Electronically Signed   By: Paulina Fusi M.D.   On: 10/13/2015 14:21   Dg Chest Port 1 View  10/15/2015  CLINICAL DATA:  Intubated patient, acute respiratory failure, CHF, diabetes and hyperosmolarity EXAM: PORTABLE CHEST 1 VIEW COMPARISON:  Portable chest x-ray of October 13, 2015 FINDINGS: The lungs are adequately inflated. The interstitial markings remain mildly increased on the left. There is left basilar atelectasis. There is no significant pleural effusion. The right lung is clear. The heart is normal in size. The pulmonary vascularity is not engorged. The endotracheal tube tip lies approximately 5.2 cm above the carina. The esophagogastric tube tip projects below the inferior margin of the image. The right internal jugular venous catheter tip projects over the midportion of the SVC. The permanent pacemaker is in stable position. IMPRESSION: Stable appearance of the chest since the earlier study. There is left lower lobe atelectasis. The support tubes are in reasonable position. Electronically Signed   By: David  [place] M.D.   On: 10/15/2015 07:31   Dg Chest Port 1 View  10/13/2015  CLINICAL DATA:  Intubated EXAM: PORTABLE CHEST 1 VIEW COMPARISON:  08/25/2014 FINDINGS: Endotracheal tube has its tip 2.5 cm above the carina. Nasogastric tube enters the stomach. Right internal jugular central line has its tip in the SVC above the right atrium. Dual lead pacemaker has leads in the region of the right atrium and right ventricle. There is my normal atelectasis at the lung bases. No pulmonary edema. No consolidation. IMPRESSION: Lines and tubes well positioned.  Minimal basilar atelectasis.  Electronically Signed   By: Paulina Fusi M.D.   On: 10/13/2015 16:08   Dg Abd Portable 1v  10/13/2015  CLINICAL DATA:  Nasogastric catheter placement EXAM: PORTABLE ABDOMEN - 1 VIEW COMPARISON:  10/13/2015 FINDINGS: Nasogastric catheter is again seen but now directed towards the distal stomach. Scattered large and small bowel gas is noted. IMPRESSION: Nasogastric catheter as described. Electronically Signed   By: Alcide Clever M.D.   On: 10/13/2015 21:30   Dg Abd Portable 1v  10/13/2015  CLINICAL DATA:  Orogastric tube placement. EXAM: PORTABLE ABDOMEN - 1 VIEW COMPARISON:  Earlier today. FINDINGS: Orogastric tube tip  in the proximal stomach. Normal bowel gas pattern. Thoracolumbar spine degenerative changes and mild scoliosis. IMPRESSION: Orogastric tube tip in the proximal stomach. Electronically Signed   By: Beckie Salts M.D.   On: 10/13/2015 19:17   Dg Abd Portable 1v  10/13/2015  CLINICAL DATA:  OG tube placement. EXAM: PORTABLE ABDOMEN - 1 VIEW COMPARISON:  Earlier the same day. FINDINGS: 1811 hours. There is no evidence for OG tube overlying the lower mediastinum or upper abdomen. Bowel gas pattern is nonspecific. IMPRESSION: No evidence for OG tube over the lower chest or upper abdomen. Consider chest x-ray to assess tube position or tube removal and replacement These results will be called to the ordering clinician or representative by the Radiologist Assistant, and communication documented in the PACS or zVision Dashboard. Electronically Signed   By: Kennith Center M.D.   On: 10/13/2015 18:36   Dg Abd Portable 1v  10/13/2015  CLINICAL DATA:  Orogastric tube placement EXAM: PORTABLE ABDOMEN - 1 VIEW COMPARISON:  None. FINDINGS: Orogastric tube tip is in the mid body of the stomach. Side hole is just pass the gastroesophageal junction. IMPRESSION: Orogastric tube tip in the mid body of the stomach. Electronically Signed   By: Paulina Fusi M.D.   On: 10/13/2015 16:09         DISCUSSION:   ASSESSMENT / PLAN:  PULMONARY Acute respiratory failure - r/t AMS, lactic acidosis Hx COPD   - does not meet extubation criteria due to agitated encephalopathy P:   Vent support - 8cc/kg  F/u CXR  F/u ABG PRN BD   CARDIOVASCULAR Hx HTN and diast dysfn in 2015  Troponin elevation - mild . REsolving. ECHO normal. . not on presssors P: Hold home lasix for now  RENAL Acute on CKD (aseline 1.1-1.3) Hyponatremia   - AKI improving   P:  F/u chem  Gentle volume  F/u lactate now  NS @ 164ml/hr -> reduce to d5 half at kvo   GASTROINTESTINAL Diarrhea . CT abd neg   - no issues. Dysphagia + at home P:   PPI prophylaxis Speech eval pos extubation needed  HEMATOLOGIC New anemia 10/14/15 - No active bleeding P:  Sq heparin  F/u CBC - recheck 3pm  INFECTIOUS  Recent Labs Lab 10/13/15 1538 10/13/15 2050 10/14/15 1529 10/15/15 0008 10/15/15 0522  LATICACIDVEN 4.2* 4.7* 3.4* 2.2*  --   PROCALCITON <0.10  --   --   --  0.67    No clear source for infection but possible she has apiration pna (hx of dysphagia +)  P:   Pan culture  Empiric abx as above  Pct  Trend lactate -recheck 3pm  ENDOCRINE DM2 HHNK - no acidosis, neg ketones   Hypothyroid  P:   SSI alone for now  Check tsh Cont home synthroid   NEUROLOGIC AMS -  Likely metabolic encephalopathy.  CT head negattve P:   RASS goal: -1 Propofol gtt    FAMILY  - Updates:  No family available 2/22 and 10/14/15. Brotehr [name] updated in detail 10/15/2015. Hopefull extubate nxt few days   Globalx x  The patient is critically ill with multiple organ systems failure and requires high complexity decision making for assessment and support, frequent evaluation and titration of therapies, application of advanced monitoring technologies and extensive interpretation of multiple databases.   Critical Care Time devoted to patient care services described in this note is  30  Minutes.  This time reflects time of care of this signee  Dr Kalman Shan. This critical care time does not reflect procedure time, or teaching time or supervisory time of PA/NP/Med student/Med Resident etc but could involve care discussion time    Dr. Kalman Shan, M.D., [facility].C.P Pulmonary and Critical Care Medicine Staff Physician [facility] Pager: [phone], If no answer or between  15:00h - 7:00h: call [phone]  10/15/2015 12:41 PM

## 2015-10-16 ENCOUNTER — Inpatient Hospital Stay (HOSPITAL_COMMUNITY): Payer: Medicare HMO

## 2015-10-16 LAB — MAGNESIUM: Magnesium: 2 mg/dL (ref 1.7–2.4)

## 2015-10-16 LAB — CBC WITH DIFFERENTIAL/PLATELET
Basophils Absolute: 0 10*3/uL (ref 0.0–0.1)
Basophils Relative: 0 %
Eosinophils Absolute: 0.1 10*3/uL (ref 0.0–0.7)
Eosinophils Relative: 1 %
HEMATOCRIT: 29.7 % — AB (ref 36.0–46.0)
HEMOGLOBIN: 9.8 g/dL — AB (ref 12.0–15.0)
LYMPHS ABS: 1.7 10*3/uL (ref 0.7–4.0)
LYMPHS PCT: 22 %
MCH: 27.1 pg (ref 26.0–34.0)
MCHC: 33 g/dL (ref 30.0–36.0)
MCV: 82.3 fL (ref 78.0–100.0)
Monocytes Absolute: 0.4 10*3/uL (ref 0.1–1.0)
Monocytes Relative: 6 %
NEUTROS PCT: 71 %
Neutro Abs: 5.3 10*3/uL (ref 1.7–7.7)
Platelets: 128 10*3/uL — ABNORMAL LOW (ref 150–400)
RBC: 3.61 MIL/uL — AB (ref 3.87–5.11)
RDW: 14.8 % (ref 11.5–15.5)
WBC: 7.6 10*3/uL (ref 4.0–10.5)

## 2015-10-16 LAB — PROTIME-INR
INR: 1.26 (ref 0.00–1.49)
PROTHROMBIN TIME: 15.9 s — AB (ref 11.6–15.2)

## 2015-10-16 LAB — BASIC METABOLIC PANEL
Anion gap: 14 (ref 5–15)
BUN: 15 mg/dL (ref 6–20)
CHLORIDE: 108 mmol/L (ref 101–111)
CO2: 19 mmol/L — AB (ref 22–32)
Calcium: 8 mg/dL — ABNORMAL LOW (ref 8.9–10.3)
Creatinine, Ser: 1.14 mg/dL — ABNORMAL HIGH (ref 0.44–1.00)
GFR calc Af Amer: >60 mL/min (ref >60)
GFR calc non Af Amer: 54 mL/min — ABNORMAL LOW (ref >60)
GLUCOSE: 179 mg/dL — AB (ref 65–99)
POTASSIUM: 3.1 mmol/L — AB (ref 3.5–5.1)
Sodium: 141 mmol/L (ref 135–145)

## 2015-10-16 LAB — HEPATIC FUNCTION PANEL
ALBUMIN: 1.8 g/dL — AB (ref 3.5–5.0)
ALK PHOS: 91 U/L (ref 38–126)
ALT: 15 U/L (ref 14–54)
AST: 25 U/L (ref 15–41)
BILIRUBIN DIRECT: 0.2 mg/dL (ref 0.1–0.5)
BILIRUBIN TOTAL: 0.6 mg/dL (ref 0.3–1.2)
Indirect Bilirubin: 0.4 mg/dL (ref 0.3–0.9)
Total Protein: 5.5 g/dL — ABNORMAL LOW (ref 6.5–8.1)

## 2015-10-16 LAB — PHOSPHORUS: PHOSPHORUS: 4.1 mg/dL (ref 2.5–4.6)

## 2015-10-16 LAB — GLUCOSE, CAPILLARY
GLUCOSE-CAPILLARY: 153 mg/dL — AB (ref 65–99)
GLUCOSE-CAPILLARY: 162 mg/dL — AB (ref 65–99)
Glucose-Capillary: 179 mg/dL — ABNORMAL HIGH (ref 65–99)
Glucose-Capillary: 213 mg/dL — ABNORMAL HIGH (ref 65–99)
Glucose-Capillary: 220 mg/dL — ABNORMAL HIGH (ref 65–99)

## 2015-10-16 LAB — CK TOTAL AND CKMB (NOT AT ARMC)
CK TOTAL: 251 U/L — AB (ref 38–234)
CK, MB: 10.7 ng/mL — ABNORMAL HIGH (ref 0.5–5.0)
RELATIVE INDEX: 4.3 — AB (ref 0.0–2.5)

## 2015-10-16 LAB — LACTIC ACID, PLASMA: Lactic Acid, Venous: 1.9 mmol/L (ref 0.5–2.0)

## 2015-10-16 LAB — PROCALCITONIN: PROCALCITONIN: 0.42 ng/mL

## 2015-10-16 MED ORDER — DEXMEDETOMIDINE HCL IN NACL 400 MCG/100ML IV SOLN
0.4000 ug/kg/h | INTRAVENOUS | Status: DC
  Administered 2015-10-16: 0.5 ug/kg/h via INTRAVENOUS
  Administered 2015-10-16 (×2): 1 ug/kg/h via INTRAVENOUS
  Administered 2015-10-16: 0.8 ug/kg/h via INTRAVENOUS
  Administered 2015-10-16 – 2015-10-17 (×4): 1 ug/kg/h via INTRAVENOUS
  Administered 2015-10-17: 0.5 ug/kg/h via INTRAVENOUS
  Administered 2015-10-17 (×2): 1 ug/kg/h via INTRAVENOUS
  Administered 2015-10-17: 0.5 ug/kg/h via INTRAVENOUS
  Administered 2015-10-17 (×2): 1 ug/kg/h via INTRAVENOUS
  Administered 2015-10-18: 1.2 ug/kg/h via INTRAVENOUS
  Administered 2015-10-18 (×2): 1 ug/kg/h via INTRAVENOUS
  Filled 2015-10-16 (×3): qty 50
  Filled 2015-10-16 (×3): qty 100
  Filled 2015-10-16 (×4): qty 50
  Filled 2015-10-16: qty 100
  Filled 2015-10-16 (×6): qty 50

## 2015-10-16 MED ORDER — VITAL HIGH PROTEIN PO LIQD: 1000.0000 mL | ORAL | Status: DC

## 2015-10-16 MED ORDER — POTASSIUM CHLORIDE 20 MEQ/15ML (10%) PO SOLN
40.0000 meq | Freq: Once | ORAL | Status: AC
  Administered 2015-10-16: 40 meq via ORAL
  Filled 2015-10-16: qty 30

## 2015-10-16 MED ORDER — ALTEPLASE 2 MG IJ SOLR
2.0000 mg | Freq: Once | INTRAMUSCULAR | Status: AC
  Administered 2015-10-16: 2 mg
  Filled 2015-10-16: qty 2

## 2015-10-16 MED ORDER — PROPOFOL 1000 MG/100ML IV EMUL
INTRAVENOUS | Status: AC
  Filled 2015-10-16: qty 100

## 2015-10-16 NOTE — Progress Notes (Signed)
PT Cancellation Note  Patient Details Name: [name] MRN: [id number] DOB: 23-Nov-1960   Cancelled Treatment:    Reason Eval/Treat Not Completed: Patient not medically ready Patient remains on vent.  Will continue to monitor until appropriate for PT evaluation.  Vena Austria 10/16/2015, 10:07 AM Durenda Hurt. Renaldo Fiddler, [facility] Acute Rehab Services Pager [phone]

## 2015-10-16 NOTE — Progress Notes (Signed)
Pt with OG tube draining green "bile looking" fluid from abdomen; belly soft with active bowel sounds; CCM MD aware; orders received to hold tube feeding overnight

## 2015-10-16 NOTE — Progress Notes (Signed)
Nutrition Consult/Follow Up  DOCUMENTATION CODES:   Obesity unspecified  INTERVENTION:     Initiate Vital High Protein formula at 25 ml/hr and increase by 10 ml every 4 hours to goal rate of 55 ml/hr  Above TF regimen to provide 1320 kcals, 115 gm protein,1103 ml of free water  NUTRITION DIAGNOSIS:   Inadequate oral intake related to inability to eat as evidenced by NPO status, ongoing  GOAL:   Provide needs based on ASPEN/SCCM guidelines, progressing  MONITOR:   TF tolerance, Vent status, Labs, Weight trends, Skin, I & O's  ASSESSMENT:   55 yo female with hx of DM, PVD, s/p R BKA, HTN, CHF, OSA on CPAP. Presented with altered mental state, saying that she is "hearing stuff". Pt states that her home glucose ranges from 300-400. Pt's only complaint at this time are her high sugars and she occasionally has to pee but states that this is normal. On 2/22 pt was emergently intubated due to severe distress and altered mental status.     Patient is currently intubated on ventilator support -- OGT in place Temp (24hrs), Avg:98.4 F (36.9 C), Min:98.1 F (36.7 C), Max:98.6 F (37 C)   Propofol discontinued.  Changed to Precedex. CCM note 2/25 reviewed.  Pt does not meet extubation criteria at this time given agitation, encephalopathy. RD consulted for TF initiation & management.  Diet Order:  Diet NPO time specified  Skin:  Wound (see comment) (Diabetic foot ulcer)  Last BM:  2/23  Height:   Ht Readings from Last 1 Encounters:  10/13/15 5\' 5"  (1.651 m)    Weight:   Wt Readings from Last 1 Encounters:  10/16/15 217 lb 9.5 oz (98.7 kg)    Ideal Body Weight:  53.1 kg  BMI:  Body mass index is 36.21 kg/(m^2).  Estimated Nutritional Needs:   Kcal:  10/18/15  Protein:  >/= 106 grams  Fluid:  >/= 2L  EDUCATION NEEDS:   No education needs identified at this time   [id number], RD, LDN Pager #: [phone] After-Hours Pager #: [phone]

## 2015-10-16 NOTE — Consult Note (Signed)
PULMONARY / CRITICAL CARE MEDICINE   Name: [name] MRN: [id number] DOB: [birth year]    ADMISSION DATE:  10/13/2015 CONSULTATION DATE:  2/22  REFERRING MD:  FPTS   CHIEF COMPLAINT:  AMS, lactic acidosis, HHNK   BRIEF 55yo female with multiple medical problems including DM with PVD s/p R BKA, HTN, dCHF, CKD 2, GERD, RA, COPD, OSA on CPAP. She presented 2/22 with AMS endorsing auditory hallucinations and increased blood glucose. Her blood sugar was noted to be 939 on admission without ketones or acidosis. Initial w/u also revealed hyponatremia and cute on CKD. She was started on IV insulin gtt and given fluids. She was awaiting admission when she became increasingly confused, combative and a f/u lactic acid was elevated at 5 and PCCM consulted to assess for ICU admission.    On Dr. Gwendolyn Grant arrival pt was in acute distress, RR 50's, desatting.  Intubated urgently.     STUDIES:  CT head 2/22>>> neg acute (motion degraded)  2D echo 2/22>>>  CULTURES: BC x2 2/22>>> Urine 2/22>>>  Results for orders placed or performed during the hospital encounter of 10/13/15  Culture, blood (routine x 2)     Status: None (Preliminary result)   Collection Time: 10/13/15  9:45 AM  Result Value Ref Range Status   Specimen Description BLOOD LEFT HAND  Final   Special Requests BOTTLES DRAWN AEROBIC AND ANAEROBIC  Final   Culture NO GROWTH 3 DAYS  Final   Report Status PENDING  Incomplete  Culture, blood (routine x 2)     Status: None (Preliminary result)   Collection Time: 10/13/15  9:50 AM  Result Value Ref Range Status   Specimen Description BLOOD LEFT FOREARM  Final   Special Requests BOTTLES DRAWN AEROBIC AND ANAEROBIC  Final   Culture NO GROWTH 3 DAYS  Final   Report Status PENDING  Incomplete  MRSA PCR Screening     Status: None   Collection Time: 10/13/15  7:44 PM  Result Value Ref Range Status   MRSA by PCR NEGATIVE NEGATIVE Final    Comment:        The GeneXpert  MRSA Assay (FDA approved for NASAL specimens only), is one component of a comprehensive MRSA colonization surveillance program. It is not intended to diagnose MRSA infection nor to guide or monitor treatment for MRSA infections.   Urine culture     Status: None   Collection Time: 10/13/15  9:00 PM  Result Value Ref Range Status   Specimen Description URINE, CATHETERIZED  Final   Special Requests NONE  Final   Culture NO GROWTH 1 DAY  Final   Report Status 10/15/2015 FINAL  Final     ANTIBIOTICS: Vanc 2/22>>>2/24 Zosyn 2/22>>>  LINES/TUBES: ETT 2/22>>> R IJ CVL 2/22>>>  EVENTS  10/14/15 - not on pressors. On cooling blanet. On diprivan + fent PRN - wau gets agitated but does not follow commands. On C Diff precaution but no diarrhea yet. Making urine. No family at bedsie.  40% fio2    10/15/15 - creat better. Making urine. Trop and lactate normalizing. LVEF 55% - unclear about wall motion abnormality. Brother [name] at [employer] - reports chronic heavy smoker, chronic dysphagia and hallcinations and drinking 1 gallon of tea pror to admit with blood sugar 900 at home prior to EMS. He denies her being on psych meds Fever curve improving - on cooling blanket.      SUBJECTIVE/OVERNIGHT/INTERVAL HX 10/16/15 - beginning to follow commands but has  cough (baseline has chronic cough) and still agitation. RN does not think patient ready for extubation. Urine turning green with diprivan. CK 344 but creat down and lactate normal   VITAL SIGNS: BP 155/62 mmHg  Pulse 66  Temp(Src) 98.6 F (37 C) (Rectal)  Resp 31  Ht 5\' 5"  (1.651 m)  Wt 98.7 kg (217 lb 9.5 oz)  BMI 36.21 kg/m2  SpO2 100%  HEMODYNAMICS: CVP:  [6 mmHg-8 mmHg] 8 mmHg  VENTILATOR SETTINGS: Vent Mode:  [-] PRVC FiO2 (%):  [40 %] 40 % Set Rate:  [35 bmp] 35 bmp Vt Set:  [500 mL] 500 mL PEEP:  [5 cmH20] 5 cmH20 Plateau Pressure:  [20 cmH20-24 cmH20] 22 cmH20  INTAKE / OUTPUT: I/O last 3 completed shifts: In: 3862  [I.V.:3162; NG/GT:150; IV Piggyback:550] Out: 5005 [Urine:3655; Emesis/NG output:1350]  PHYSICAL EXAMINATION: General:  Chronically and critically ill appearing female, NAD post intubation  Neuro:  Sedated post intubation  RASS -3 but now tracking to voice. Coughs + HEENT:  Mm moist, ETT, R IJ CVL c/d  Cardiovascular:  s1s2 rrr Lungs:  Distant, resps even non labored on vent  Abdomen:  Round, soft, non distended, non tender per RN Musculoskeletal:  Warm and dry, R BKA, ulceration L foot with metatarsal amputation  LABS:  PULMONARY  Recent Labs Lab 10/13/15 0844 10/13/15 1452 10/13/15 1535  PHART 7.310* 7.246* 7.212*  PCO2ART 52.1* 51.1* 59.3*  PO2ART 81.0 57.0* 328.0*  HCO3 26.2* 22.2 23.8  TCO2 28 24 26   O2SAT 95.0 84.0 100.0    CBC  Recent Labs Lab 10/14/15 0500 10/14/15 1529 10/16/15 0545  HGB 8.6* 10.7* 9.8*  HCT 24.2* 31.5* 29.7*  WBC 16.7* 11.5* 7.6  PLT 227 147* 128*    COAGULATION  Recent Labs Lab 10/16/15 0545  INR 1.26    CARDIAC    Recent Labs Lab 10/13/15 2050 10/14/15 1529 10/15/15 0522  TROPONINI 4.15* 1.09* 0.48*   No results for input(s): PROBNP in the last 168 hours.   CHEMISTRY  Recent Labs Lab 10/12/15 2142 10/13/15 1530 10/14/15 0500 10/14/15 1529 10/16/15 0545  NA 128* 136 135 132* 141  K 5.0 4.3 3.7 3.3* 3.1*  CL 93* 100* 103 101 108  CO2 20* 18* 20* 17* 19*  GLUCOSE 939* 461* 259* 256* 179*  BUN 22* 18 20 18 15   CREATININE 1.48* 1.26* 1.42* 1.24* 1.14*  CALCIUM 8.9 8.8* 8.3* 7.9* 8.0*  MG  --   --  1.5* 2.2 2.0  PHOS  --   --  <1.0* 3.8 4.1   Estimated Creatinine Clearance: 65.6 mL/min (by C-G formula based on Cr of 1.14).   LIVER  Recent Labs Lab 10/14/15 0500 10/16/15 0545  AST 41 25  ALT 21 15  ALKPHOS 95 91  BILITOT 0.2* 0.6  PROT 5.7* 5.5*  ALBUMIN 2.4* 1.8*  INR  --  1.26     INFECTIOUS  Recent Labs Lab 10/13/15 1538  10/14/15 1529 10/15/15 0008 10/15/15 0522 10/16/15 0050   LATICACIDVEN 4.2*  < > 3.4* 2.2*  --  1.9  PROCALCITON <0.10  --   --   --  0.67  --   < > = values in this interval not displayed.   ENDOCRINE CBG (last 3)   Recent Labs  10/15/15 2324 10/16/15 0505 10/16/15 0738  GLUCAP 186* 179* 153*         IMAGING x48h  - image(s) personally visualized  -   highlighted in bold Dg Chest  Port 1 View  10/16/2015  CLINICAL DATA:  Evaluate ETT. EXAM: PORTABLE CHEST 1 VIEW COMPARISON:  October 15, 2015 FINDINGS: The support apparatus, including the ET tube, is stable in the interval. Left basilar atelectasis is stable. Mild opacity in the medial right lung base has a linear component, likely atelectasis, also stable. No change in the cardiomediastinal silhouette. No pneumothorax. IMPRESSION: No interval change in support apparatus or bibasilar atelectasis. Electronically Signed   By: Gerome Sam III M.D   On: 10/16/2015 07:11   Dg Chest Port 1 View  10/15/2015  CLINICAL DATA:  Intubated patient, acute respiratory failure, CHF, diabetes and hyperosmolarity EXAM: PORTABLE CHEST 1 VIEW COMPARISON:  Portable chest x-ray of October 13, 2015 FINDINGS: The lungs are adequately inflated. The interstitial markings remain mildly increased on the left. There is left basilar atelectasis. There is no significant pleural effusion. The right lung is clear. The heart is normal in size. The pulmonary vascularity is not engorged. The endotracheal tube tip lies approximately 5.2 cm above the carina. The esophagogastric tube tip projects below the inferior margin of the image. The right internal jugular venous catheter tip projects over the midportion of the SVC. The permanent pacemaker is in stable position. IMPRESSION: Stable appearance of the chest since the earlier study. There is left lower lobe atelectasis. The support tubes are in reasonable position. Electronically Signed   By: David  [place] M.D.   On: 10/15/2015 07:31        DISCUSSION:   ASSESSMENT /  PLAN:  PULMONARY Acute respiratory failure - r/t AMS, lactic acidosis Hx COPD   - does not meet extubation criteria due to agitated encephalopathy though improving P:   Vent support - 8cc/kg  F/u CXR  F/u ABG PRN BD   CARDIOVASCULAR Hx HTN and diast dysfn in 2015  Troponin elevation - mild . REsolving. ECHO normal. . not on presssors P: Hold home lasix for now  RENAL Acute on CKD (aseline 1.1-1.3) Hyponatremia   - AKI improving   P:    d5 half at kvo   GASTROINTESTINAL Diarrhea . CT abd neg   - no issues. Dysphagia + at home P:   Start TF PPI prophylaxis Speech eval pos extubation needed  HEMATOLOGIC New anemia 10/14/15 - No active bleeding Platelent beginning to drop P:   Sq heparin - monitor platelet Track Hgb - prbbc if < 7gm% or active bleed    INFECTIOUS  Recent Labs Lab 10/13/15 1538  10/14/15 1529 10/15/15 0008 10/15/15 0522 10/16/15 0050  LATICACIDVEN 4.2*  < > 3.4* 2.2*  --  1.9  PROCALCITON <0.10  --   --   --  0.67  --   < > = values in this interval not displayed.  No clear source for infection but possible she has apiration pna (hx of dysphagia +)  P:   Pan culture  Empiric abx as above  Pct    ENDOCRINE DM2 HHNK - no acidosis, neg ketones   Hypothyroid  P:   SSI alone for now  Await  tsh Cont home synthroid   NEUROLOGIC AMS -  Likely metabolic encephalopathy.  CT head negattve   - slowly improving. Urine green with diprivan and CK 344 P:   RASS goal: -1 Propofol gtt  -> change to precedex   FAMILY  - Updates:  No family available 2/22 and 10/14/15. Brotehr [name] updated in detail 10/15/2015.   [facility] [place] extubate nxt few days; started precedex 10/16/2015  The patient is critically ill with multiple organ systems failure and requires high complexity decision making for assessment and support, frequent evaluation and titration of therapies, application of advanced monitoring technologies and extensive  interpretation of multiple databases.   Critical Care Time devoted to patient care services described in this note is  30  Minutes. This time reflects time of care of this signee Dr Kalman Shan. This critical care time does not reflect procedure time, or teaching time or supervisory time of PA/NP/Med student/Med Resident etc but could involve care discussion time    Dr. Kalman Shan, M.D., [facility].C.P Pulmonary and Critical Care Medicine Staff Physician [facility] [place] [facility] Critical [facility] Pager: [phone], If no answer or between  15:00h - 7:00h: call [phone]  10/16/2015 11:32 AM

## 2015-10-17 ENCOUNTER — Inpatient Hospital Stay (HOSPITAL_COMMUNITY): Payer: Medicare HMO

## 2015-10-17 LAB — CBC WITH DIFFERENTIAL/PLATELET
BASOS PCT: 0 %
Basophils Absolute: 0 10*3/uL (ref 0.0–0.1)
EOS ABS: 0.1 10*3/uL (ref 0.0–0.7)
Eosinophils Relative: 1 %
HCT: 28.5 % — ABNORMAL LOW (ref 36.0–46.0)
HEMOGLOBIN: 9.3 g/dL — AB (ref 12.0–15.0)
LYMPHS ABS: 1.7 10*3/uL (ref 0.7–4.0)
Lymphocytes Relative: 30 %
MCH: 26.8 pg (ref 26.0–34.0)
MCHC: 32.6 g/dL (ref 30.0–36.0)
MCV: 82.1 fL (ref 78.0–100.0)
Monocytes Absolute: 0.3 10*3/uL (ref 0.1–1.0)
Monocytes Relative: 5 %
NEUTROS PCT: 64 %
Neutro Abs: 3.6 10*3/uL (ref 1.7–7.7)
Platelets: 127 10*3/uL — ABNORMAL LOW (ref 150–400)
RBC: 3.47 MIL/uL — AB (ref 3.87–5.11)
RDW: 14.4 % (ref 11.5–15.5)
WBC: 5.7 10*3/uL (ref 4.0–10.5)

## 2015-10-17 LAB — PHOSPHORUS: Phosphorus: 4.1 mg/dL (ref 2.5–4.6)

## 2015-10-17 LAB — BASIC METABOLIC PANEL
ANION GAP: 11 (ref 5–15)
BUN: 13 mg/dL (ref 6–20)
CALCIUM: 8.3 mg/dL — AB (ref 8.9–10.3)
CHLORIDE: 109 mmol/L (ref 101–111)
CO2: 18 mmol/L — ABNORMAL LOW (ref 22–32)
Creatinine, Ser: 1.28 mg/dL — ABNORMAL HIGH (ref 0.44–1.00)
GFR calc non Af Amer: 47 mL/min — ABNORMAL LOW (ref >60)
GFR, EST AFRICAN AMERICAN: 54 mL/min — AB (ref >60)
Glucose, Bld: 264 mg/dL — ABNORMAL HIGH (ref 65–99)
POTASSIUM: 3.4 mmol/L — AB (ref 3.5–5.1)
SODIUM: 138 mmol/L (ref 135–145)

## 2015-10-17 LAB — GLUCOSE, CAPILLARY
GLUCOSE-CAPILLARY: 247 mg/dL — AB (ref 65–99)
GLUCOSE-CAPILLARY: 255 mg/dL — AB (ref 65–99)
GLUCOSE-CAPILLARY: 279 mg/dL — AB (ref 65–99)

## 2015-10-17 LAB — MAGNESIUM: MAGNESIUM: 1.9 mg/dL (ref 1.7–2.4)

## 2015-10-17 LAB — LACTIC ACID, PLASMA: LACTIC ACID, VENOUS: 1.1 mmol/L (ref 0.5–2.0)

## 2015-10-17 MED ORDER — FUROSEMIDE 10 MG/ML IJ SOLN
40.0000 mg | Freq: Once | INTRAMUSCULAR | Status: AC
  Administered 2015-10-17: 40 mg via INTRAVENOUS
  Filled 2015-10-17: qty 4

## 2015-10-17 MED ORDER — POTASSIUM CHLORIDE 20 MEQ/15ML (10%) PO SOLN
40.0000 meq | Freq: Once | ORAL | Status: AC
  Administered 2015-10-17: 40 meq
  Filled 2015-10-17: qty 30

## 2015-10-17 MED ORDER — FLUCONAZOLE 40 MG/ML PO SUSR
100.0000 mg | Freq: Every day | ORAL | Status: DC
  Administered 2015-10-17 – 2015-10-20 (×4): 100 mg via ORAL
  Filled 2015-10-17 (×4): qty 2.5

## 2015-10-17 MED ORDER — WHITE PETROLATUM GEL
Status: AC
  Administered 2015-10-17: 1
  Filled 2015-10-17: qty 1

## 2015-10-17 MED ORDER — MAGNESIUM SULFATE 2 GM/50ML IV SOLN
2.0000 g | Freq: Once | INTRAVENOUS | Status: AC
  Administered 2015-10-17: 2 g via INTRAVENOUS
  Filled 2015-10-17: qty 50

## 2015-10-17 NOTE — Progress Notes (Signed)
PULMONARY / CRITICAL CARE MEDICINE   Name: [name] MRN: [id number] DOB: Feb 19, 1961    ADMISSION DATE:  10/13/2015 CONSULTATION DATE:  2/22  REFERRING MD:  FPTS   CHIEF COMPLAINT:  AMS, lactic acidosis, HHNK   BRIEF 55yo female with multiple medical problems including DM with PVD s/p R BKA, HTN, dCHF, CKD 2, GERD, RA, COPD, OSA on CPAP. She presented 2/22 with AMS endorsing auditory hallucinations and increased blood glucose. Her blood sugar was noted to be 939 on admission without ketones or acidosis. Initial w/u also revealed hyponatremia and cute on CKD. She was started on IV insulin gtt and given fluids. She was awaiting admission when she became increasingly confused, combative and a f/u lactic acid was elevated at 5 and PCCM consulted to assess for ICU admission.    On Dr. Gwendolyn Grant arrival pt was in acute distress, RR 50's, desatting.  Intubated urgently.     STUDIES:  CT head 2/22>>> neg acute (motion degraded)  2D echo 2/22>>>  CULTURES: BC x2 2/22>>> Urine 2/22>>>neg     ANTIBIOTICS: Vanc 2/22>>>2/24 Zosyn 2/22>>>  LINES/TUBES: ETT 2/22>>> R IJ CVL 2/22>>>  EVENTS  10/14/15 - not on pressors. On cooling blanet. On diprivan + fent PRN - wau gets agitated but does not follow commands. On C Diff precaution but no diarrhea yet. Making urine. No family at bedsie.  40% fio2    10/15/15 - creat better. Making urine. Trop and lactate normalizing. LVEF 55% - unclear about wall motion abnormality. Brother shane at [employer] - reports chronic heavy smoker, chronic dysphagia and hallcinations and drinking 1 gallon of tea pror to admit with blood sugar 900 at home prior to EMS. He denies her being on psych meds Fever curve improving - on cooling blanket.     10/16/15 - beginning to follow commands but has cough (baseline has chronic cough) and still agitation. RN does not think patient ready for extubation. Urine turning green with diprivan. CK 344 but creat down  and lactate normal   SUBJECTIVE/OVERNIGHT/INTERVAL HX 10/17/15 - on precedex. Meets SBT criteria. WUA in progress but agitation improved an dcough improved. RN reports oral thrush   VITAL SIGNS: BP 179/94 mmHg  Pulse 76  Temp(Src) 98.1 F (36.7 C) (Rectal)  Resp 35  Ht 5\' 5"  (1.651 m)  Wt 94.4 kg (208 lb 1.8 oz)  BMI 34.63 kg/m2  SpO2 100%  HEMODYNAMICS:    VENTILATOR SETTINGS: Vent Mode:  [-] PRVC FiO2 (%):  [40 %] 40 % Set Rate:  [35 bmp] 35 bmp Vt Set:  [500 mL] 500 mL PEEP:  [5 cmH20] 5 cmH20 Plateau Pressure:  [20 cmH20-22 cmH20] 21 cmH20  INTAKE / OUTPUT: I/O last 3 completed shifts: In: 55 1616.8 [I.V.:1216.8; IV Piggyback:400] Out: 3540 [Urine:1840; Emesis/NG output:1700]  PHYSICAL EXAMINATION: General:  Chronically and critically ill appearing female, Neuro:  On precedex. Less cough. RASS -1 to 0 on precedex. Improved tracking but not fully following commands. Moves all 4 HEENT:  Mm moist, ETT, R IJ CVL c/d (thrush per RN) Cardiovascular:  s1s2 rrr Lungs:  Distant, resps even non labored on vent  Abdomen:  Round, soft, non distended, non tender per RN Musculoskeletal:  Warm and dry, R BKA, ulceration L foot with metatarsal amputation  LABS:  PULMONARY  Recent Labs Lab 10/13/15 0844 10/13/15 1452 10/13/15 1535  PHART 7.310* 7.246* 7.212*  PCO2ART 52.1* 51.1* 59.3*  PO2ART 81.0 57.0* 328.0*  HCO3 26.2* 22.2 23.8  TCO2 28 24 26  O2SAT 95.0 84.0 100.0    CBC  Recent Labs Lab 10/14/15 1529 10/16/15 0545 10/17/15 0440  HGB 10.7* 9.8* 9.3*  HCT 31.5* 29.7* 28.5*  WBC 11.5* 7.6 5.7  PLT 147* 128* 127*    COAGULATION  Recent Labs Lab 10/16/15 0545  INR 1.26    CARDIAC    Recent Labs Lab 10/13/15 2050 10/14/15 1529 10/15/15 0522  TROPONINI 4.15* 1.09* 0.48*   No results for input(s): PROBNP in the last 168 hours.   CHEMISTRY  Recent Labs Lab 10/13/15 1530 10/14/15 0500 10/14/15 1529 10/16/15 0545 10/17/15 0440  NA 136  135 132* 141 138  K 4.3 3.7 3.3* 3.1* 3.4*  CL 100* 103 101 108 109  CO2 18* 20* 17* 19* 18*  GLUCOSE 461* 259* 256* 179* 264*  BUN 18 20 18 15 13   CREATININE 1.26* 1.42* 1.24* 1.14* 1.28*  CALCIUM 8.8* 8.3* 7.9* 8.0* 8.3*  MG  --  1.5* 2.2 2.0 1.9  PHOS  --  <1.0* 3.8 4.1 4.1   Estimated Creatinine Clearance: 57.1 mL/min (by C-G formula based on Cr of 1.28).   LIVER  Recent Labs Lab 10/14/15 0500 10/16/15 0545  AST 41 25  ALT 21 15  ALKPHOS 95 91  BILITOT 0.2* 0.6  PROT 5.7* 5.5*  ALBUMIN 2.4* 1.8*  INR  --  1.26     INFECTIOUS  Recent Labs Lab 10/13/15 1538  10/15/15 0008 10/15/15 0522 10/16/15 0050 10/16/15 0545 10/17/15 0010  LATICACIDVEN 4.2*  < > 2.2*  --  1.9  --  1.1  PROCALCITON <0.10  --   --  0.67  --  0.42  --   < > = values in this interval not displayed.   ENDOCRINE CBG (last 3)   Recent Labs  10/16/15 2028 10/17/15 0006 10/17/15 0526  GLUCAP 220* 255* 247*         IMAGING x48h  - image(s) personally visualized  -   highlighted in bold Dg Chest Port 1 View  10/17/2015  CLINICAL DATA:  ET tube present EXAM: PORTABLE CHEST 1 VIEW COMPARISON:  10/16/2015 FINDINGS: Support devices including endotracheal tube are stable. Left pacer is stable. Mild cardiomegaly. Patchy left upper lobe and left basilar airspace opacities. This is similar to prior study. Minimal right base atelectasis, improved. No visible effusions. No acute bony abnormality. IMPRESSION: Patchy left upper lobe and left base atelectasis or infiltrate. Improving right base atelectasis. Electronically Signed   By: 10/18/2015 M.D.   On: 10/17/2015 07:17   Dg Chest Port 1 View  10/16/2015  CLINICAL DATA:  Evaluate ETT. EXAM: PORTABLE CHEST 1 VIEW COMPARISON:  October 15, 2015 FINDINGS: The support apparatus, including the ET tube, is stable in the interval. Left basilar atelectasis is stable. Mild opacity in the medial right lung base has a linear component, likely atelectasis,  also stable. No change in the cardiomediastinal silhouette. No pneumothorax. IMPRESSION: No interval change in support apparatus or bibasilar atelectasis. Electronically Signed   By: October 17, 2015 III M.D   On: 10/16/2015 07:11        DISCUSSION:   ASSESSMENT / PLAN:  PULMONARY Acute respiratory failure - r/t AMS, lactic acidosis Hx COPD   - meets  sbt criteria  P:   SBT on lower level of precedex -> extubate if does well Vent support - 8cc/kg  F/u CXR  F/u ABG PRN BD   CARDIOVASCULAR Hx HTN and diast dysfn in 2015  Troponin elevation - mild .  REsolving. ECHO normal. . not on presssors P:  Restart lasix. Give 1 dose challenge 10/18/15  RENAL Acute on CKD (aseline 1.1-1.3) Hyponatremia   - AKI improved but ? Reversing trend 2/27.  - Mild low K and mild low mag   P:   Lasix x 1 challenge 2./27 KCL and mag repletion d5 half at kvo   GASTROINTESTINAL Diarrhea . CT abd neg   - no issues. Dysphagia + at home. TF held 10/16/15 due to high NG returns  P:   NPO PPI prophylaxis Speech eval pos extubation needed  HEMATOLOGIC New anemia 10/14/15 - No active bleeding Platelent lower but holding 2/27  P:   Sq heparin - monitor platelet Track Hgb - prbbc if < 7gm% or active bleed    INFECTIOUS  Recent Labs Lab 10/13/15 1538  10/15/15 0008 10/15/15 0522 10/16/15 0050 10/16/15 0545 10/17/15 0010  LATICACIDVEN 4.2*  < > 2.2*  --  1.9  --  1.1  PROCALCITON <0.10  --   --  0.67  --  0.42  --   < > = values in this interval not displayed.  No clear source for infection but possible she has apiration pna (hx of dysphagia +) Oral thrush per RN   P:   PO diflucan 2/27 x 7 days (get ekg 2/28 fo Qtc check) Pan culture  Empiric abx as above - narrow as culture results come in  Pct    ENDOCRINE DM2 HHNK - no acidosis, neg ketones   Hypothyroid  P:   SSI alone for now  Await  tsh Cont home synthroid   NEUROLOGIC AMS -  Likely metabolic  encephalopathy.  CT head negattve.  Urine green with diprivan and CK 344 - diprivan stopped 2/26    - slowly improving.  P:   RASS goal: 0 to +1 precedex  FAMILY  - Updates:  No family available 2/22 and 10/14/15. Brotehr [name] updated in detail 10/15/2015. No family 10/15/14 and 10/17/15  Globalx Do sbt and aim to extubate on precedexx 2/26 or 2/27. Give lasix. Monitor creat. Rx thrush     The patient is critically ill with multiple organ systems failure and requires high complexity decision making for assessment and support, frequent evaluation and titration of therapies, application of advanced monitoring technologies and extensive interpretation of multiple databases.   Critical Care Time devoted to patient care services described in this note is  30  Minutes. This time reflects time of care of this signee Dr Kalman Shan. This critical care time does not reflect procedure time, or teaching time or supervisory time of PA/NP/Med student/Med Resident etc but could involve care discussion time    Dr. Kalman Shan, M.D., [facility].C.P Pulmonary and Critical Care Medicine Staff Physician [facility] [place] Pulmonary and Critical Care Pager: [phone], If no answer or between  15:00h - 7:00h: call [phone]  10/17/2015 8:52 AM

## 2015-10-17 NOTE — Progress Notes (Signed)
eLink Physician-Brief Progress Note Patient Name: WINONA SISON DOB: Oct 22, 1960 MRN: [id number]   Date of Service  10/17/2015  HPI/Events of Note  ABG on 35/500/40% PEEP 5 : 7.49/18/168ABG   eICU Interventions  Will decrease respiratory rate and wean FiO2 to maintain sats > 92%     Intervention Category Major Interventions: Acid-Base disturbance - evaluation and management  Dorothyann Gibbs 10/17/2015, 5:22 PM

## 2015-10-17 NOTE — Progress Notes (Signed)
Pharmacy Antibiotic Note  [name] is a 55 y.o. female admitted on 10/13/2015 with sepsis.  Pharmacy has been consulted for Zosyn dosing.  Pt also now has oral thrush. Fluconazole has been started per CCM. Cultures remain negative, pt afebrile, WBC wnl  Plan: Zosyn 3.375g IV q8h (4 hour infusion). f/u duration of treatment. Fluconazole 100 mg daily x 7 days  Height: 5\' 5"  (165.1 cm) Weight: 208 lb 1.8 oz (94.4 kg) IBW/kg (Calculated) : 57  Temp (24hrs), Avg:98.5 F (36.9 C), Min:97.9 F (36.6 C), Max:99.1 F (37.3 C)   Recent Labs Lab 10/12/15 2142  10/13/15 1530  10/13/15 2050 10/14/15 0500 10/14/15 1529 10/15/15 0008 10/16/15 0050 10/16/15 0545 10/17/15 0010 10/17/15 0440  WBC 6.3  --   --   --   --  16.7* 11.5*  --   --  7.6  --  5.7  CREATININE 1.48*  --  1.26*  --   --  1.42* 1.24*  --   --  1.14*  --  1.28*  LATICACIDVEN  --   < >  --   < > 4.7*  --  3.4* 2.2* 1.9  --  1.1  --   < > = values in this interval not displayed.  Estimated Creatinine Clearance: 57.1 mL/min (by C-G formula based on Cr of 1.28).    Allergies  Allergen Reactions  . Erythromycin Diarrhea  . Iodine-131 Nausea And Vomiting  . Gadolinium Nausea And Vomiting     Code: VOM, Desc: Pt began vomiting immed post infusion of multihance, Onset Date: 10/19/15   . Ivp Dye [Iodinated Diagnostic Agents] Nausea And Vomiting    Nausea and vomiting   . Metformin Diarrhea     diarrhea  . Penicillins Hives    Has patient had a PCN reaction causing immediate rash, facial/tongue/throat swelling, SOB or lightheadedness with hypotension: No Has patient had a PCN reaction causing severe rash involving mucus membranes or skin necrosis: No Has patient had a PCN reaction that required hospitalization No Has patient had a PCN reaction occurring within the last 10 years: No If all of the above answers are "NO", then may proceed with Cephalosporin use.     Antimicrobials this admission: 2/23  vanc>>2/24 2/23 Zosyn >> 2/27 fluconazole >> (7 days-stop entered)  Dose adjustments this admission: n/a  Microbiology results: 2/22 BCx x2 >> NGx4D 2/22 UCx >> NG 2/22 PCR - neg  Thank you for allowing pharmacy to be a part of this patient's care.  3/22, PharmD Clinical Pharmacy Resident Pager: [phone] 10/17/2015 1:51 PM

## 2015-10-18 ENCOUNTER — Inpatient Hospital Stay (HOSPITAL_COMMUNITY): Payer: Medicare HMO

## 2015-10-18 LAB — BLOOD GAS, ARTERIAL
Bicarbonate: 13.9 mEq/L — ABNORMAL LOW (ref 20.0–24.0)
DRAWN BY: 331761
FIO2: 0.4
MECHVT: 500 mL
O2 Saturation: 98.3 %
PATIENT TEMPERATURE: 98.6
PEEP: 5 cmH2O
PH ART: 7.494 — AB (ref 7.350–7.450)
RATE: 35 resp/min
TCO2: 14.4 mmol/L (ref 0–100)
pCO2 arterial: 18.2 mmHg — CL (ref 35.0–45.0)
pO2, Arterial: 168 mmHg — ABNORMAL HIGH (ref 80.0–100.0)

## 2015-10-18 LAB — POCT I-STAT 3, ART BLOOD GAS (G3+)
Acid-base deficit: 3 mmol/L — ABNORMAL HIGH (ref 0.0–2.0)
Bicarbonate: 18.9 mEq/L — ABNORMAL LOW (ref 20.0–24.0)
O2 Saturation: 98 %
TCO2: 20 mmol/L (ref 0–100)
pCO2 arterial: 25.8 mmHg — ABNORMAL LOW (ref 35.0–45.0)
pH, Arterial: 7.475 — ABNORMAL HIGH (ref 7.350–7.450)
pO2, Arterial: 102 mmHg — ABNORMAL HIGH (ref 80.0–100.0)

## 2015-10-18 LAB — CULTURE, BLOOD (ROUTINE X 2)
CULTURE: NO GROWTH
Culture: NO GROWTH

## 2015-10-18 LAB — PHOSPHORUS: PHOSPHORUS: 3.9 mg/dL (ref 2.5–4.6)

## 2015-10-18 LAB — GLUCOSE, CAPILLARY
GLUCOSE-CAPILLARY: 131 mg/dL — AB (ref 65–99)
GLUCOSE-CAPILLARY: 195 mg/dL — AB (ref 65–99)
GLUCOSE-CAPILLARY: 211 mg/dL — AB (ref 65–99)
GLUCOSE-CAPILLARY: 231 mg/dL — AB (ref 65–99)
Glucose-Capillary: 200 mg/dL — ABNORMAL HIGH (ref 65–99)
Glucose-Capillary: 280 mg/dL — ABNORMAL HIGH (ref 65–99)
Glucose-Capillary: 276 mg/dL — ABNORMAL HIGH (ref 65–99)
Glucose-Capillary: 233 mg/dL — ABNORMAL HIGH (ref 65–99)
Glucose-Capillary: 138 mg/dL — ABNORMAL HIGH (ref 65–99)
Glucose-Capillary: 106 mg/dL — ABNORMAL HIGH (ref 65–99)

## 2015-10-18 LAB — BASIC METABOLIC PANEL
ANION GAP: 12 (ref 5–15)
BUN: 14 mg/dL (ref 6–20)
CHLORIDE: 105 mmol/L (ref 101–111)
CO2: 19 mmol/L — ABNORMAL LOW (ref 22–32)
Calcium: 8.6 mg/dL — ABNORMAL LOW (ref 8.9–10.3)
Creatinine, Ser: 1.36 mg/dL — ABNORMAL HIGH (ref 0.44–1.00)
GFR calc non Af Amer: 43 mL/min — ABNORMAL LOW (ref >60)
GFR, EST AFRICAN AMERICAN: 50 mL/min — AB (ref >60)
Glucose, Bld: 239 mg/dL — ABNORMAL HIGH (ref 65–99)
POTASSIUM: 3.5 mmol/L (ref 3.5–5.1)
SODIUM: 136 mmol/L (ref 135–145)

## 2015-10-18 LAB — CBC WITH DIFFERENTIAL/PLATELET
BASOS PCT: 0 %
Basophils Absolute: 0 10*3/uL (ref 0.0–0.1)
EOS ABS: 0.1 10*3/uL (ref 0.0–0.7)
Eosinophils Relative: 1 %
HCT: 32.2 % — ABNORMAL LOW (ref 36.0–46.0)
HEMOGLOBIN: 10.4 g/dL — AB (ref 12.0–15.0)
LYMPHS ABS: 1.9 10*3/uL (ref 0.7–4.0)
Lymphocytes Relative: 26 %
MCH: 26.7 pg (ref 26.0–34.0)
MCHC: 32.3 g/dL (ref 30.0–36.0)
MCV: 82.6 fL (ref 78.0–100.0)
Monocytes Absolute: 0.7 10*3/uL (ref 0.1–1.0)
Monocytes Relative: 9 %
NEUTROS PCT: 64 %
Neutro Abs: 4.6 10*3/uL (ref 1.7–7.7)
Platelets: 188 10*3/uL (ref 150–400)
RBC: 3.9 MIL/uL (ref 3.87–5.11)
RDW: 14.1 % (ref 11.5–15.5)
WBC: 7.2 10*3/uL (ref 4.0–10.5)

## 2015-10-18 LAB — MAGNESIUM: Magnesium: 1.8 mg/dL (ref 1.7–2.4)

## 2015-10-18 MED ORDER — ACETAMINOPHEN 650 MG RE SUPP
650.0000 mg | Freq: Four times a day (QID) | RECTAL | Status: DC | PRN
  Filled 2015-10-18: qty 1

## 2015-10-18 MED ORDER — DEXTROSE 5 % IV SOLN
1.0000 g | INTRAVENOUS | Status: DC
  Administered 2015-10-18: 1 g via INTRAVENOUS
  Filled 2015-10-18 (×2): qty 10

## 2015-10-18 MED ORDER — CETYLPYRIDINIUM CHLORIDE 0.05 % MT LIQD
7.0000 mL | Freq: Two times a day (BID) | OROMUCOSAL | Status: DC
  Administered 2015-10-18 – 2015-10-25 (×7): 7 mL via OROMUCOSAL

## 2015-10-18 MED ORDER — CETYLPYRIDINIUM CHLORIDE 0.05 % MT LIQD: 7.0000 mL | Freq: Two times a day (BID) | OROMUCOSAL | Status: DC

## 2015-10-18 MED ORDER — CHLORHEXIDINE GLUCONATE 0.12 % MT SOLN
15.0000 mL | Freq: Two times a day (BID) | OROMUCOSAL | Status: DC
  Administered 2015-10-18 – 2015-10-25 (×9): 15 mL via OROMUCOSAL
  Filled 2015-10-18 (×10): qty 15

## 2015-10-18 MED ORDER — INSULIN GLARGINE 100 UNIT/ML ~~LOC~~ SOLN
10.0000 [IU] | Freq: Every day | SUBCUTANEOUS | Status: DC
  Administered 2015-10-18 – 2015-10-20 (×3): 10 [IU] via SUBCUTANEOUS
  Filled 2015-10-18 (×3): qty 0.1

## 2015-10-18 NOTE — Progress Notes (Signed)
eLink Physician-Brief Progress Note Patient Name: [name] DOB: July 13, 1961 MRN: [id number]   Date of Service  10/18/2015  HPI/Events of Note  RN called with abg results. 7.48/26/102 on Rate of 26 , TV 500, 30% fio2. Was hypercapneic prior  eICU Interventions  Dec rate to 20. Check abg.      Intervention Category Intermediate Interventions: Other:  Louann Sjogren 10/18/2015, 6:37 AM

## 2015-10-18 NOTE — Procedures (Signed)
Extubation Procedure Note  Patient Details:   Name: [name] DOB: 05-29-1961 MRN: [id number]   Airway Documentation:  Airway (Active)    Evaluation  O2 sats: stable throughout Complications: No apparent complications Patient did tolerate procedure well. Bilateral Breath Sounds: Clear, Diminished Suctioning: Airway Yes   Pt. Was extubated to a 4L [place] with RN at the bedside without any complications, dyspnea or stridor noted. Pt. Is doing well at this time & all vitals are within normal limits.  Kuba Shepherd, Margaretmary Dys 10/18/2015, 9:40 AM

## 2015-10-18 NOTE — Care Management Important Message (Signed)
Important Message  Patient Details  Name: [name] MRN: [id number] Date of Birth: 1961-02-04   Medicare Important Message Given:  Yes    [name] [name] 10/18/2015, 4:35 PM

## 2015-10-18 NOTE — Progress Notes (Signed)
EKG CRITICAL VALUE     12 lead EKG performed.  Critical value noted.  Emeterio Reeve , RN notified.   Ivis Henneman, CCT 10/18/2015 8:58 AM

## 2015-10-18 NOTE — Progress Notes (Signed)
PULMONARY / CRITICAL CARE MEDICINE   Name: [name] MRN: [id number] DOB: 1961/02/14    ADMISSION DATE:  10/13/2015 CONSULTATION DATE:  2/22  REFERRING MD:  FPTS   CHIEF COMPLAINT:  AMS, lactic acidosis, HHNK   BRIEF 54yo female with multiple medical problems including DM with PVD s/p R BKA, HTN, dCHF, CKD 2, GERD, RA, COPD, OSA on CPAP. She presented 2/22 with AMS endorsing auditory hallucinations and increased blood glucose. devloped unclear lactic acidosis and required ETT.  STUDIES:  CT head 2/22>>> neg acute (motion degraded)  2D echo 2/22>>>55%  CULTURES: BC x2 2/22>>> Urine 2/22>>>neg  ANTIBIOTICS: Vanc 2/22>>>2/24 Zosyn 2/22>>>  LINES/TUBES: ETT 2/22>>> R IJ CVL 2/22>>>  EVENTS  10/14/15 - not on pressors. On cooling blanet. On diprivan + fent PRN - wau gets agitated but does not follow commands. On C Diff precaution but no diarrhea yet. Making urine. No family at bedsie.  40% fio2  10/15/15 - creat better. Making urine. Trop and lactate normalizing. LVEF 55% - unclear about wall motion abnormality. Brother shane at [employer] - reports chronic heavy smoker, chronic dysphagia and hallcinations and drinking 1 gallon of tea pror to admit with blood sugar 900 at home prior to EMS. He denies her being on psych meds Fever curve improving - on cooling blanket.   10/16/15 - beginning to follow commands but has cough (baseline has chronic cough) and still agitation. RN does not think patient ready for extubation. Urine turning green with diprivan. CK 344 but creat down and lactate normal  SUBJECTIVE/OVERNIGHT/INTERVAL HX On precedex  VITAL SIGNS: BP 178/62 mmHg  Pulse 68  Temp(Src) 98.6 F (37 C) (Oral)  Resp 27  Ht 5\' 5"  (1.651 m)  Wt 92.4 kg (203 lb 11.3 oz)  BMI 33.90 kg/m2  SpO2 100%  HEMODYNAMICS: CVP:  [6 mmHg] 6 mmHg  VENTILATOR SETTINGS: Vent Mode:  [-] PSV;CPAP FiO2 (%):  [30 %-40 %] 30 % Set Rate:  [26 bmp-35 bmp] 26 bmp Vt Set:  [500 mL] 500  mL PEEP:  [5 cmH20] 5 cmH20 Pressure Support:  [5 cmH20] 5 cmH20 Plateau Pressure:  [17 cmH20-24 cmH20] 17 cmH20  INTAKE / OUTPUT: I/O last 3 completed shifts: In: 1655.3 [I.V.:1085.3; NG/GT:170; IV Piggyback:400] Out: 3760 [Urine:3160; Emesis/NG output:600]  PHYSICAL EXAMINATION: General:  Chronically and critically ill appearing female, Neuro:  On precedex. Follows commands HEENT:  Mm moist, ETT, R IJ CVL dry (thrush per RN) Cardiovascular:  s1s2 rrr Lungs:  reduced  Abdomen:  Round, soft, non distended, non tender per RN Musculoskeletal:  Warm and dry, R BKA, ulceration L foot with metatarsal amputation  LABS:  PULMONARY  Recent Labs Lab 10/13/15 0844 10/13/15 1452 10/13/15 1535 10/17/15 1709 10/18/15 0401  PHART 7.310* 7.246* 7.212* PENDING 7.475*  PCO2ART 52.1* 51.1* 59.3* 18.2* 25.8*  PO2ART 81.0 57.0* 328.0* 168* 102.0*  HCO3 26.2* 22.2 23.8 13.9* 18.9*  TCO2 28 24 26  14.4 20  O2SAT 95.0 84.0 100.0 98.3 98.0    CBC  Recent Labs Lab 10/16/15 0545 10/17/15 0440 10/18/15 0441  HGB 9.8* 9.3* 10.4*  HCT 29.7* 28.5* 32.2*  WBC 7.6 5.7 7.2  PLT 128* 127* 188    COAGULATION  Recent Labs Lab 10/16/15 0545  INR 1.26    CARDIAC    Recent Labs Lab 10/13/15 2050 10/14/15 1529 10/15/15 0522  TROPONINI 4.15* 1.09* 0.48*   No results for input(s): PROBNP in the last 168 hours.   CHEMISTRY  Recent Labs Lab 10/14/15 0500  10/14/15 1529 10/16/15 0545 10/17/15 0440 10/18/15 0441  NA 135 132* 141 138 136  K 3.7 3.3* 3.1* 3.4* 3.5  CL 103 101 108 109 105  CO2 20* 17* 19* 18* 19*  GLUCOSE 259* 256* 179* 264* 239*  BUN 20 18 15 13 14   CREATININE 1.42* 1.24* 1.14* 1.28* 1.36*  CALCIUM 8.3* 7.9* 8.0* 8.3* 8.6*  MG 1.5* 2.2 2.0 1.9 1.8  PHOS <1.0* 3.8 4.1 4.1 3.9   Estimated Creatinine Clearance: 53.2 mL/min (by C-G formula based on Cr of 1.36).   LIVER  Recent Labs Lab 10/14/15 0500 10/16/15 0545  AST 41 25  ALT 21 15  ALKPHOS 95 91   BILITOT 0.2* 0.6  PROT 5.7* 5.5*  ALBUMIN 2.4* 1.8*  INR  --  1.26     INFECTIOUS  Recent Labs Lab 10/13/15 1538  10/15/15 0008 10/15/15 0522 10/16/15 0050 10/16/15 0545 10/17/15 0010  LATICACIDVEN 4.2*  < > 2.2*  --  1.9  --  1.1  PROCALCITON <0.10  --   --  0.67  --  0.42  --   < > = values in this interval not displayed.   ENDOCRINE CBG (last 3)   Recent Labs  10/17/15 2346 10/18/15 0423 10/18/15 0752  GLUCAP 231* 233* 211*    IMAGING x48h  - image(s) personally visualized  -   highlighted in bold Dg Chest Port 1 View  10/18/2015  CLINICAL DATA:  Hypoxia EXAM: PORTABLE CHEST 1 VIEW COMPARISON:  October 17, 2015 FINDINGS: Endotracheal tube tip is 4.4 cm above the carina. Central catheter tip is in the superior cava. Nasogastric tube tip and side port are below the diaphragm. Pacemaker leads are attached to the right atrium and right ventricle. No pneumothorax. There is atelectatic change in the left base. The lungs elsewhere are clear. Heart size and pulmonary vascularity are normal. No adenopathy. No bone lesions. IMPRESSION: Tube and catheter positions as described without pneumothorax. Left base atelectasis. Lungs elsewhere clear. No change in cardiac silhouette. Electronically Signed   By: October 19, 2015 III M.D.   On: 10/18/2015 07:13   Dg Chest Port 1 View  10/17/2015  CLINICAL DATA:  ET tube present EXAM: PORTABLE CHEST 1 VIEW COMPARISON:  10/16/2015 FINDINGS: Support devices including endotracheal tube are stable. Left pacer is stable. Mild cardiomegaly. Patchy left upper lobe and left basilar airspace opacities. This is similar to prior study. Minimal right base atelectasis, improved. No visible effusions. No acute bony abnormality. IMPRESSION: Patchy left upper lobe and left base atelectasis or infiltrate. Improving right base atelectasis. Electronically Signed   By: 10/18/2015 M.D.   On: 10/17/2015 07:17    DISCUSSION:   ASSESSMENT /  PLAN:  PULMONARY Acute respiratory failure - r/t AMS, lactic acidosis Hx COPD  P:   SBT on low dose precedex cpap 5 ps5, goal 30 min assess rsbi pcxr without infiltrate \\may  need rate redcution  CARDIOVASCULAR Hx HTN and diast dysfn in 2015 No evidence ischemia  P:  Was neg 1.3 liters, see renal  RENAL Acute on CKD (aseline 1.1-1.3)  P:   Lasix x 1 challenge 2./27, cvp 6, hold further, no edema on pcxr kvo  GASTROINTESTINAL Diarrhea . CT abd neg R/o ileus P:   NPO PPI prophylaxis Obtain kub likely needs reglan!  HEMATOLOGIC New anemia 10/14/15 - No active bleeding Platelent lower but holding 2/27  P:   Sq heparin - monitor platelet  INFECTIOUS  Recent Labs Lab 10/13/15 1538  10/15/15  0008 10/15/15 0522 10/16/15 0050 10/16/15 0545 10/17/15 0010  LATICACIDVEN 4.2*  < > 2.2*  --  1.9  --  1.1  PROCALCITON <0.10  --   --  0.67  --  0.42  --   < > = values in this interval not displayed.  No clear source for infection but possible she has apiration pna (hx of dysphagia +) Oral thrush per RN   P:   PO diflucan 2/27 x 7 days (get ekg 2/28 fo Qtc check) Pan culture  Culture neg, no evidence infection, narrow at min to cefriaxone  ENDOCRINE DM2 HHNK - no acidosis, neg ketones   Hypothyroid  P:   SSI Add lantus Cont home synthroid   NEUROLOGIC AMS -  Likely metabolic encephalopathy.  CT head negattve.  Urine green with diprivan and CK 344 - diprivan stopped 2/26  P:   RASS goal: 0  precedex redcution  FAMILY  - Updates:  No family available 2/22 and 10/14/15. Brotehr [name] updated in detail 10/15/2015. No family 10/15/14 and 10/17/15   Ccm time 35 min   Mcarthur Rossetti. Tyson Alias, MD, FACP Pgr: [phone] [facility]

## 2015-10-18 NOTE — Progress Notes (Signed)
PT Cancellation Note  Patient Details Name: [name] MRN: [id number] DOB: 1961-08-21   Cancelled Treatment:    Reason Eval/Treat Not Completed: Patient not medically ready   Noted per CCM on 2/26: "SBT on lower level of precedex -> extubate if does well" Will continue to follow and proceed with PT eval when appropriate -- likely post extubation.  Thanks,  Van Clines, PT  Acute Rehabilitation Services Pager [phone] Office [phone]    Van Clines [facility] 10/18/2015, 8:15 AM

## 2015-10-19 DIAGNOSIS — R21 Rash and other nonspecific skin eruption: Secondary | ICD-10-CM

## 2015-10-19 DIAGNOSIS — B001 Herpesviral vesicular dermatitis: Secondary | ICD-10-CM

## 2015-10-19 DIAGNOSIS — B009 Herpesviral infection, unspecified: Secondary | ICD-10-CM | POA: Diagnosis present

## 2015-10-19 DIAGNOSIS — A047 Enterocolitis due to Clostridium difficile: Secondary | ICD-10-CM

## 2015-10-19 DIAGNOSIS — R410 Disorientation, unspecified: Secondary | ICD-10-CM

## 2015-10-19 DIAGNOSIS — Z950 Presence of cardiac pacemaker: Secondary | ICD-10-CM

## 2015-10-19 DIAGNOSIS — R440 Auditory hallucinations: Secondary | ICD-10-CM

## 2015-10-19 DIAGNOSIS — R443 Hallucinations, unspecified: Secondary | ICD-10-CM | POA: Diagnosis present

## 2015-10-19 DIAGNOSIS — R509 Fever, unspecified: Secondary | ICD-10-CM

## 2015-10-19 LAB — BLOOD GAS, ARTERIAL

## 2015-10-19 LAB — CBC WITH DIFFERENTIAL/PLATELET
BASOS PCT: 0 %
Basophils Absolute: 0 10*3/uL (ref 0.0–0.1)
EOS PCT: 2 %
Eosinophils Absolute: 0.2 10*3/uL (ref 0.0–0.7)
HEMATOCRIT: 38.4 % (ref 36.0–46.0)
Hemoglobin: 12.7 g/dL (ref 12.0–15.0)
LYMPHS ABS: 1.8 10*3/uL (ref 0.7–4.0)
LYMPHS PCT: 17 %
MCH: 28.1 pg (ref 26.0–34.0)
MCHC: 33.1 g/dL (ref 30.0–36.0)
MCV: 85 fL (ref 78.0–100.0)
MONOS PCT: 9 %
Monocytes Absolute: 1 10*3/uL (ref 0.1–1.0)
NEUTROS ABS: 7.6 10*3/uL (ref 1.7–7.7)
Neutrophils Relative %: 72 %
Platelets: 278 10*3/uL (ref 150–400)
RBC: 4.52 MIL/uL (ref 3.87–5.11)
RDW: 14.3 % (ref 11.5–15.5)
WBC: 10.6 10*3/uL — ABNORMAL HIGH (ref 4.0–10.5)

## 2015-10-19 LAB — COMPREHENSIVE METABOLIC PANEL
ALK PHOS: 130 U/L — AB (ref 38–126)
ALT: 17 U/L (ref 14–54)
AST: 33 U/L (ref 15–41)
Albumin: 2.4 g/dL — ABNORMAL LOW (ref 3.5–5.0)
Anion gap: 15 (ref 5–15)
BILIRUBIN TOTAL: 0.7 mg/dL (ref 0.3–1.2)
BUN: 22 mg/dL — ABNORMAL HIGH (ref 6–20)
CALCIUM: 9.2 mg/dL (ref 8.9–10.3)
CO2: 21 mmol/L — AB (ref 22–32)
CREATININE: 1.47 mg/dL — AB (ref 0.44–1.00)
Chloride: 108 mmol/L (ref 101–111)
GFR, EST AFRICAN AMERICAN: 46 mL/min — AB (ref >60)
GFR, EST NON AFRICAN AMERICAN: 39 mL/min — AB (ref >60)
Glucose, Bld: 127 mg/dL — ABNORMAL HIGH (ref 65–99)
Potassium: 4 mmol/L (ref 3.5–5.1)
SODIUM: 144 mmol/L (ref 135–145)
TOTAL PROTEIN: 6.8 g/dL (ref 6.5–8.1)

## 2015-10-19 LAB — GLUCOSE, CAPILLARY
GLUCOSE-CAPILLARY: 284 mg/dL — AB (ref 65–99)
Glucose-Capillary: 109 mg/dL — ABNORMAL HIGH (ref 65–99)
Glucose-Capillary: 142 mg/dL — ABNORMAL HIGH (ref 65–99)
Glucose-Capillary: 279 mg/dL — ABNORMAL HIGH (ref 65–99)
Glucose-Capillary: 236 mg/dL — ABNORMAL HIGH (ref 65–99)

## 2015-10-19 LAB — C DIFFICILE QUICK SCREEN W PCR REFLEX
C DIFFICLE (CDIFF) ANTIGEN: POSITIVE — AB
C Diff toxin: NEGATIVE

## 2015-10-19 LAB — PHOSPHORUS: Phosphorus: 6 mg/dL — ABNORMAL HIGH (ref 2.5–4.6)

## 2015-10-19 LAB — MAGNESIUM: Magnesium: 1.9 mg/dL (ref 1.7–2.4)

## 2015-10-19 MED ORDER — DEXTROSE 5 % IV SOLN
600.0000 mg | Freq: Two times a day (BID) | INTRAVENOUS | Status: DC
  Administered 2015-10-19 – 2015-10-21 (×4): 600 mg via INTRAVENOUS
  Filled 2015-10-19 (×7): qty 12

## 2015-10-19 MED ORDER — BENZONATATE 100 MG PO CAPS
100.0000 mg | ORAL_CAPSULE | Freq: Three times a day (TID) | ORAL | Status: DC
  Administered 2015-10-19 – 2015-10-21 (×7): 100 mg via ORAL
  Filled 2015-10-19 (×7): qty 1

## 2015-10-19 MED ORDER — MAGIC MOUTHWASH
5.0000 mL | Freq: Three times a day (TID) | ORAL | Status: DC | PRN
  Filled 2015-10-19: qty 5

## 2015-10-19 MED ORDER — HYDRALAZINE HCL 25 MG PO TABS
25.0000 mg | ORAL_TABLET | Freq: Three times a day (TID) | ORAL | Status: DC
  Administered 2015-10-19 – 2015-10-25 (×16): 25 mg via ORAL
  Filled 2015-10-19 (×17): qty 1

## 2015-10-19 MED ORDER — INSULIN ASPART 100 UNIT/ML ~~LOC~~ SOLN
0.0000 [IU] | Freq: Three times a day (TID) | SUBCUTANEOUS | Status: DC
  Administered 2015-10-20: 7 [IU] via SUBCUTANEOUS
  Administered 2015-10-20: 5 [IU] via SUBCUTANEOUS
  Administered 2015-10-20: 9 [IU] via SUBCUTANEOUS

## 2015-10-19 NOTE — Progress Notes (Signed)
[name] Admission Data: 10/19/2015 6:50 PM Attending Provider: Nelda Bucks, MD  YBO:[id number], DAVID C, MD Consults/ Treatment Team:    [name] is a 55 y.o. female patient transferred from 2 heart. Patient is alert  & orientated  X 3. Pt is slow to respond,  Full Code, Pt is on room air. Tele # [phone] placed and verified with [facility] CNA Tech.    IV site WDL:  Right Forearm with a transparent dsg that's clean dry and intact. Saline locked.   Allergies:   Allergies  Allergen Reactions  . Erythromycin Diarrhea  . Iodine-131 Nausea And Vomiting  . Gadolinium Nausea And Vomiting     Code: VOM, Desc: Pt began vomiting immed post infusion of multihance, Onset Date: [id number]   . Ivp Dye [Iodinated Diagnostic Agents] Nausea And Vomiting    Nausea and vomiting   . Metformin Diarrhea     diarrhea  . Penicillins Hives    Has patient had a PCN reaction causing immediate rash, facial/tongue/throat swelling, SOB or lightheadedness with hypotension: No Has patient had a PCN reaction causing severe rash involving mucus membranes or skin necrosis: No Has patient had a PCN reaction that required hospitalization No Has patient had a PCN reaction occurring within the last 10 years: No If all of the above answers are "NO", then may proceed with Cephalosporin use.      Past Medical History  Diagnosis Date  . Diabetic neuropathy (HCC)   . Cellulitis   . Thyroid disease   . Hyperlipidemia   . Neuromuscular disorder (HCC)   . Concussion as a teenager    slight  . Diarrhea     pt takes Reglan tid  . Hypothyroidism     takes Synthroid daily  . Depression     takes CYmbalta daily  . MRSA (methicillin resistant staph aureus) culture positive     hx of 2012  . Staph infection 2007  . Complication of anesthesia 01/2013    "didn't know where I was; who I was; talking out the top of my head" (May 12, 2013)  . Hypertension   . Peripheral vascular disease (HCC)   . CHF  (congestive heart failure) (HCC)   . Pneumonia 1980's    "hospitalized w/it" (05-12-13)  . Chronic bronchitis (HCC)   . Orthopnea     "just one; yesterday morning" (12-May-2013)  . Type II diabetes mellitus (HCC)     Novolog,Levemir,and Lovaza daily  . GERD (gastroesophageal reflux disease)   . Daily headache   . Rheumatoid arthritis (HCC)     "both knees" (2013/05/12)  . Anxiety   . Kidney stone     "passed on before" (May 12, 2013)  . Non-healing wound of lower extremity     lt foot  . OSA on CPAP     and oxygen  . COPD (chronic obstructive pulmonary disease) (HCC)     wear oxygen at home  . Symptomatic bradycardia 03/04/2014   Pt orientation to unit, room and routine. SR up x 2, Bed low and locked. Bed alarm on. Pt verbalizes an understanding of how to use the call bell and to call for help before getting out of bed.  RN Noted sores on pt's lips, abrasions to pt face and neck. Multiple stages of bruising to BUE. Excoriation to peri area and buttocks. Barrier cream applied.   Will cont to monitor and assist as needed. Will report off to night shift.   Tobin Chad, RN 10/19/2015 6:56  PM

## 2015-10-19 NOTE — Progress Notes (Signed)
Patient placed on Airborne and currently placed on Enteric Precautions @ 1045 pending Infection Prevention MD consult and pending C-Diff results.  Dr. Tyson Alias @ bedside speaking with mother.  Precaution information given to mother and informed her of proper PPE when visiting.  Dr. Daiva Eves @ bedside speaking with mother @ 1200 and assessing patient.  Per Dr. Daiva Eves Airborne precautions can be discontinue, however C-Diff results indicate need for continuing Enteric Precautions.

## 2015-10-19 NOTE — Progress Notes (Signed)
Nutrition Follow-up  DOCUMENTATION CODES:   Obesity unspecified  INTERVENTION:  -RD to continue to monitor for needs -PT declined ONS at this time   NUTRITION DIAGNOSIS:   Inadequate oral intake related to inability to eat as evidenced by NPO status.  Pt is now eating  GOAL:   Patient will meet greater than or equal to 90% of their needs  New goal  MONITOR:   PO intake, I & O's, Labs, Weight trends  REASON FOR ASSESSMENT:   Consult Enteral/tube feeding initiation and management  ASSESSMENT:   55 yo female with hx of DM, PVD, s/p R BKA, HTN, CHF, OSA on CPAP. Presented with altered mental state, saying that she is "hearing stuff". Pt states that her home glucose ranges from 300-400. Pt's only complaint at this time are her high sugars and she occasionally has to pee but states that this is normal. On 2/22 pt was emergently intubated due to severe distress and altered mental status.   Spoke with pt briefly at bedside. She endorses a good appetite following VENT removal. She had a tray by her bed during visit that she consumed 100% of. Denies any issues with chewing/swallowing or nausea/vomiting. She also declined ONS.   Per CCM, pt will likely transfer upstairs today. Her AMS seems to have cleared up some.  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 Poss CT of the brain later to look at possible CNS infection vs HSV-1 related encephalitis. Pt started on acyclovir/valtrex.  Continue to follow for PO intake, transfer to floor.  Diet Order:  Diet Carb Modified Fluid consistency:: Thin; Room service appropriate?: Yes  Skin:  Wound (see comment) (Diabetic foot ulcer)  Last BM:  2/23  Height:   Ht Readings from Last 1 Encounters:  10/13/15 5\' 5"  (1.651 m)    Weight:   Wt Readings from Last 1 Encounters:  10/19/15 201 lb 11.5 oz (91.5 kg)    Ideal Body  Weight:  53.1 kg  BMI:  Body mass index is 33.57 kg/(m^2).  Estimated Nutritional Needs:   Kcal:  10/21/15  Protein:  >/= 106 grams  Fluid:  >/= 2L  EDUCATION NEEDS:   No education needs identified at this time  [id number]. Kynadee Dam, MS, RD LDN After Hours/Weekend Pager [phone]

## 2015-10-19 NOTE — Progress Notes (Signed)
PULMONARY / CRITICAL CARE MEDICINE   Name: [name] MRN: [id number] DOB: [birth year]    ADMISSION DATE:  10/13/2015 CONSULTATION DATE:  2/22  REFERRING MD:  FPTS   CHIEF COMPLAINT:  AMS, lactic acidosis, HHNK   BRIEF 55yo female with multiple medical problems including DM with PVD s/p R BKA, HTN, dCHF, CKD 2, GERD, RA, COPD, OSA on CPAP. She presented 2/22 with AMS endorsing auditory hallucinations and increased blood glucose. devloped unclear lactic acidosis and required ETT.  STUDIES:  CT head 2/22>>> neg acute (motion degraded)  2D echo 2/22>>>55%  CULTURES: BC x2 2/22>>> Urine 2/22>>>neg  ANTIBIOTICS: Vanc 2/22>>>2/24 Zosyn 2/22>>>  LINES/TUBES: ETT 2/22>>> R IJ CVL 2/22>>>  EVENTS  10/14/15 - not on pressors. On cooling blanet. On diprivan + fent PRN - wau gets agitated but does not follow commands. On C Diff precaution but no diarrhea yet. Making urine. No family at bedsie.  40% fio2  10/15/15 - creat better. Making urine. Trop and lactate normalizing. LVEF 55% - unclear about wall motion abnormality. Brother shane at [employer] - reports chronic heavy smoker, chronic dysphagia and hallcinations and drinking 1 gallon of tea pror to admit with blood sugar 900 at home prior to EMS. He denies her being on psych meds Fever curve improving - on cooling blanket.   10/16/15 - beginning to follow commands but has cough (baseline has chronic cough) and still agitation. RN does not think patient ready for extubation. Urine turning green with diprivan. CK 344 but creat down and lactate normal  SUBJECTIVE/OVERNIGHT/INTERVAL HX Extubated no distress   VITAL SIGNS: BP 143/69 mmHg  Pulse 70  Temp(Src) 98.5 F (36.9 C) (Oral)  Resp 20  Ht 5\' 5"  (1.651 m)  Wt 91.5 kg (201 lb 11.5 oz)  BMI 33.57 kg/m2  SpO2 96%  HEMODYNAMICS: CVP:  [7 mmHg-10 mmHg] 7 mmHg  VENTILATOR SETTINGS:    INTAKE / OUTPUT: I/O last 3 completed shifts: In: 713.3 [I.V.:413.3; IV  Piggyback:300] Out: 1300 [Urine:900; Emesis/NG output:400]  PHYSICAL EXAMINATION: General:  Off vent, no distress Neuro:  Follows commands, nonfocal examination HEENT:  Thrush concerns Cardiovascular:  s1s2 rrr Lungs:  CTA Abdomen:  Round, soft, non distended, non tender per RN Musculoskeletal:  Warm and dry, R BKA, ulceration L foot with metatarsal amputation  LABS:  PULMONARY  Recent Labs Lab 10/13/15 0844 10/13/15 1452 10/13/15 1535 10/17/15 1709 10/18/15 0401  PHART 7.310* 7.246* 7.212* TEST WILL BE CREDITED  7.494* 7.475*  PCO2ART 52.1* 51.1* 59.3* TEST WILL BE CREDITED  18.2* 25.8*  PO2ART 81.0 57.0* 328.0* TEST WILL BE CREDITED  168* 102.0*  HCO3 26.2* 22.2 23.8 TEST WILL BE CREDITED  13.9* 18.9*  TCO2 28 24 26  TEST WILL BE CREDITED  14.4 20  O2SAT 95.0 84.0 100.0 TEST WILL BE CREDITED  98.3 98.0    CBC  Recent Labs Lab 10/17/15 0440 10/18/15 0441 10/19/15 0500  HGB 9.3* 10.4* 12.7  HCT 28.5* 32.2* 38.4  WBC 5.7 7.2 10.6*  PLT 127* 188 278    COAGULATION  Recent Labs Lab 10/16/15 0545  INR 1.26    CARDIAC    Recent Labs Lab 10/13/15 2050 10/14/15 1529 10/15/15 0522  TROPONINI 4.15* 1.09* 0.48*   No results for input(s): PROBNP in the last 168 hours.   CHEMISTRY  Recent Labs Lab 10/14/15 1529 10/16/15 0545 10/17/15 0440 10/18/15 0441 10/19/15 0500  NA 132* 141 138 136 144  K 3.3* 3.1* 3.4* 3.5 4.0  CL 101 108  109 105 108  CO2 17* 19* 18* 19* 21*  GLUCOSE 256* 179* 264* 239* 127*  BUN 18 15 13 14  22*  CREATININE 1.24* 1.14* 1.28* 1.36* 1.47*  CALCIUM 7.9* 8.0* 8.3* 8.6* 9.2  MG 2.2 2.0 1.9 1.8 1.9  PHOS 3.8 4.1 4.1 3.9 6.0*   Estimated Creatinine Clearance: 48.9 mL/min (by C-G formula based on Cr of 1.47).   LIVER  Recent Labs Lab 10/14/15 0500 10/16/15 0545 10/19/15 0500  AST 41 25 33  ALT 21 15 17   ALKPHOS 95 91 130*  BILITOT 0.2* 0.6 0.7  PROT 5.7* 5.5* 6.8  ALBUMIN 2.4* 1.8* 2.4*  INR  --  1.26  --       INFECTIOUS  Recent Labs Lab 10/13/15 1538  10/15/15 0008 10/15/15 0522 10/16/15 0050 10/16/15 0545 10/17/15 0010  LATICACIDVEN 4.2*  < > 2.2*  --  1.9  --  1.1  PROCALCITON <0.10  --   --  0.67  --  0.42  --   < > = values in this interval not displayed.   ENDOCRINE CBG (last 3)   Recent Labs  10/18/15 2345 10/19/15 0407 10/19/15 0921  GLUCAP 131* 109* 142*    IMAGING x48h  - image(s) personally visualized  -   highlighted in bold Dg Abd 1 View  10/18/2015  CLINICAL DATA:  K56.7 (ICD-10-CM) - Ileus (HCC) Extubated/? ileus EXAM: ABDOMEN - 1 VIEW COMPARISON:  CT 10/14/2015 FINDINGS: Paucity of small bowel gas. Colon is nondilated, with residual oral contrast in the descending and sigmoid portions. Transvenous pacing lead is partially seen. Regional bones unremarkable. Right pelvic phlebolith. IMPRESSION: 1. Normal bowel gas pattern with residual oral contrast material in the colon. Electronically Signed   By: 10/20/2015 M.D.   On: 10/18/2015 10:07   Dg Chest Port 1 View  10/18/2015  CLINICAL DATA:  Hypoxia EXAM: PORTABLE CHEST 1 VIEW COMPARISON:  October 17, 2015 FINDINGS: Endotracheal tube tip is 4.4 cm above the carina. Central catheter tip is in the superior cava. Nasogastric tube tip and side port are below the diaphragm. Pacemaker leads are attached to the right atrium and right ventricle. No pneumothorax. There is atelectatic change in the left base. The lungs elsewhere are clear. Heart size and pulmonary vascularity are normal. No adenopathy. No bone lesions. IMPRESSION: Tube and catheter positions as described without pneumothorax. Left base atelectasis. Lungs elsewhere clear. No change in cardiac silhouette. Electronically Signed   By: 10/20/2015 III M.D.   On: 10/18/2015 07:13    DISCUSSION:   ASSESSMENT / PLAN:  PULMONARY Acute respiratory failure - r/t AMS, lactic acidosis Hx COPD  P:   sats goal 92%, she is ON RA  CARDIOVASCULAR Hx HTN and  diast dysfn in 2015 No evidence ischemia  P:  Dc tele  RENAL Acute on CKD (aseline 1.1-1.3)  P:   Lasix dc further crt to follow  GASTROINTESTINAL Diarrhea . CT abd neg R/o ileus, improved Increased stools, r/o cdiff, low suspicion P:   diet PPI prophylaxis  HEMATOLOGIC New anemia 10/14/15 - No active bleeding Platelent lower but holding 2/27  P:   Sq heparin - dc when ambulation, has amputation  INFECTIOUS  Recent Labs Lab 10/13/15 1538  10/15/15 0008 10/15/15 0522 10/16/15 0050 10/16/15 0545 10/17/15 0010  LATICACIDVEN 4.2*  < > 2.2*  --  1.9  --  1.1  PROCALCITON <0.10  --   --  0.67  --  0.42  --   < > =  values in this interval not displayed.  No clear source for infection but possible she has apiration pna (hx of dysphagia +) Oral thrush per RN   P:   PO diflucan thrush Pan culture  Limit ceftriaxone course, unimpressed infection active  ENDOCRINE DM2, controlled HHNK - no acidosis, neg ketones   Hypothyroid  P:   SSI antus keep same dose doet started, follow trend Cont home synthroid   NEUROLOGIC AMS -  Likely metabolic encephalopathy.  CT head negattve.  Urine green with diprivan and CK 344 - diprivan stopped 2/26  P:   Pt  To triad, flooor  FAMILY  - Updates:  No family available 2/22 and 10/14/15. Brotehr [name] updated in detail 10/15/2015. No family 10/15/14 and 10/17/15   Mcarthur Rossetti. Tyson Alias, MD, FACP Pgr: [phone] [facility]

## 2015-10-19 NOTE — Progress Notes (Signed)
Pharmacy Antibiotic Note  [name] is a 55 y.o. female admitted on 10/13/2015 with sepsis.  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 Patient continues to have confusion so acyclovir to be started. Other abx have been stopped.  Plan: Acyclovir 10mg /kg q12 hours (600mg ) based on IBW   Height: 5\' 5"  (165.1 cm) Weight: 201 lb 11.5 oz (91.5 kg) IBW/kg (Calculated) : 57  Temp (24hrs), Avg:98.1 F (36.7 C), Min:97.7 F (36.5 C), Max:98.5 F (36.9 C)   Recent Labs Lab 10/13/15 2050  10/14/15 1529 10/15/15 0008 10/16/15 0050 10/16/15 0545 10/17/15 0010 10/17/15 0440 10/18/15 0441 10/19/15 0500  WBC  --   < > 11.5*  --   --  7.6  --  5.7 7.2 10.6*  CREATININE  --   < > 1.24*  --   --  1.14*  --  1.28* 1.36* 1.47*  LATICACIDVEN 4.7*  --  3.4* 2.2* 1.9  --  1.1  --   --   --   < > = values in this interval not displayed.  Estimated Creatinine Clearance: 48.9 mL/min (by C-G formula based on Cr of 1.47).    Allergies  Allergen Reactions  . Erythromycin Diarrhea  . Iodine-131 Nausea And Vomiting  . Gadolinium Nausea And Vomiting     Code: VOM, Desc: Pt began vomiting immed post infusion of multihance, Onset Date: 10/19/15   . Ivp Dye [Iodinated Diagnostic Agents] Nausea And Vomiting    Nausea and vomiting   . Metformin Diarrhea     diarrhea  . Penicillins Hives    Has patient had a PCN reaction causing immediate rash, facial/tongue/throat swelling, SOB or lightheadedness with hypotension: No Has patient had a PCN reaction causing severe rash involving mucus membranes or skin necrosis: No Has patient had a PCN reaction that required hospitalization No Has patient had a PCN reaction occurring within the last 10 years: No If all of the above answers are "NO", then may proceed with Cephalosporin use.     Antimicrobials this admission: 2/23 vanc>>2/24 2/23 Zosyn >>2/27 2/27 fluconazole >>3/3 2/28 acyclovir >>   Dose adjustments this  admission: n/a  Microbiology results: 2/22 BCx x2 >> NGx4D 2/22 UCx >> NG 2/22 PCR - neg  Thank you for allowing pharmacy to be a part of this patient's care.  3/22 PharmD., BCPS Clinical Pharmacist Pager [phone] 10/19/2015 12:45 PM

## 2015-10-19 NOTE — Progress Notes (Signed)
eLink Physician-Brief Progress Note Patient Name: [name] DOB: [birth year] MRN: [id number]   Date of Service  10/19/2015  HPI/Events of Note  Pt with watery diarrhea per RN  eICU Interventions  Will send for cdiff per request     Intervention Category Intermediate Interventions: Abdominal pain - evaluation and management;Other:  Louann Sjogren 10/19/2015, 4:46 AM

## 2015-10-19 NOTE — Progress Notes (Signed)
sw spoke w pt. She lives alone w aid 2-3 hrs 7days per week. phy there rec snf but pt wants to go home at disch. Pt's mother lives in [place]. Pt has hx w  [facility] homecare and would like them again for hhrn and hhpt. Let pt know if she changes her mind and wants snf for rehab we can still work on this. Will cont to follow.

## 2015-10-19 NOTE — Evaluation (Signed)
Physical Therapy Evaluation Patient Details Name: [name] MRN: [id number] DOB: November 22, 1960 Today's Date: 10/19/2015   History of Present Illness  [name] is an 55 y.o. female with poorly controlled diabetes mellitus with diabetic retinopathy, obstructive sleep apnea, R BKA, L toe amputation who was admitted to [facility] on February 22 with altered mental status endorsing any hallucinations. She is found to be in HHNK with glucose in 939 range, without ketones. Lactic acidosis. She is febrile to over 103  Clinical Impression  Patient presents with decreased independence with mobility due to deficits listed in PT problem list and will benefit from skilled PT in the acute setting to allow return home following SNF level rehab stay.    Follow Up Recommendations SNF    Equipment Recommendations  None recommended by PT    Recommendations for Other Services       Precautions / Restrictions Precautions Precautions: Fall Precaution Comments: R BKA has prosthesis      Mobility  Bed Mobility Overal bed mobility: Needs Assistance Bed Mobility: Rolling;Supine to Sit;Sit to Supine Rolling: Min assist   Supine to sit: Mod assist Sit to supine: Min assist      Transfers Overall transfer level: Needs assistance Equipment used: None Transfers: Lateral/Scoot Transfers          Lateral/Scoot Transfers: +2 safety/equipment;Mod assist General transfer comment: scooting up to [facility], increased time, limited to in bed due to incontinent of stool and pt to get PICC line later per mother  Ambulation/Gait                Stairs            Wheelchair Mobility    Modified Rankin (Stroke Patients Only)       Balance Overall balance assessment: Needs assistance Sitting-balance support: Feet supported Sitting balance-[name]: Fair         Standing balance comment: NT                             Pertinent Vitals/Pain Faces Pain Scale:  Hurts little more Pain Location: R leg phantom pain, sore throat Pain Descriptors / Indicators: Sore;Throbbing Pain Intervention(s): Monitored during session;Repositioned    Home Living Family/patient expects to be discharged to:: Skilled nursing facility Living Arrangements: Alone Available Help at Discharge: Personal care attendant;Available PRN/intermittently Type of Home: Apartment Home Access: Stairs to enter Entrance Stairs-Rails: Right;Left Entrance Stairs-Number of Steps: 2 at sidewalk with  bilateral rails and one to get in Home Layout: One level Home Equipment: Walker - 2 wheels;Cane - single point;Bedside commode;Wheelchair - manual;Hand held shower head;Shower seat;Grab bars - tub/shower;Adaptive equipment (prosthesis)      Prior Function Level of Independence: Independent with assistive device(s)         Comments: uses w/c mainly, walks short distances, negotiates steps at entrance     Hand Dominance        Extremity/Trunk Assessment               Lower Extremity Assessment: Generalized weakness;LLE deficits/detail         Communication   Communication: No difficulties  Cognition Arousal/Alertness: Awake/alert Behavior During Therapy: WFL for tasks assessed/performed Overall Cognitive Status: Impaired/Different from baseline Area of Impairment: Orientation;Safety/judgement;Problem solving Orientation Level: Situation;Time       Safety/Judgement: Decreased awareness of deficits;Decreased awareness of safety   Problem Solving: Slow processing      General Comments General comments (skin  integrity, edema, etc.): patient lying in fecal matter in the bed and unaware.  Rolled in bed to clean and change linens, sat up at EOB and incontinent again    Exercises        Assessment/Plan    PT Assessment Patient needs continued PT services  PT Diagnosis Generalized weakness   PT Problem List Decreased strength;Decreased cognition;Decreased  knowledge of use of DME;Decreased activity tolerance;Decreased balance;Decreased mobility;Decreased coordination;Decreased knowledge of precautions;Decreased safety awareness  PT Treatment Interventions DME instruction;Balance training;Functional mobility training;Therapeutic activities;Therapeutic exercise;Wheelchair mobility training;Patient/family education   PT Goals (Current goals can be found in the Care Plan section) Acute Rehab PT Goals Patient Stated Goal: To go to nursing home PT Goal Formulation: With family Time For Goal Achievement: 10/26/15 Potential to Achieve Goals: Good    Frequency Min 3X/week   Barriers to discharge Decreased caregiver support      Co-evaluation               End of Session Equipment Utilized During Treatment: Gait belt Activity Tolerance: Patient limited by fatigue;Other (comment) (incontinent of stool) Patient left: in bed;with call bell/phone within reach;with bed alarm set;with family/visitor present           Time: 8469-6295 PT Time Calculation (min) (ACUTE ONLY): 30 min   Charges:   PT Evaluation $PT Eval Moderate Complexity: 1 Procedure PT Treatments $Therapeutic Activity: 8-22 mins   PT G Codes:        Elray Mcgregor 10-29-15, 12:58 PM  Sheran Lawless, PT [phone] 29-Oct-2015

## 2015-10-19 NOTE — Consult Note (Signed)
Date of Admission:  10/13/2015  Date of Consult:  10/19/2015  Reason for Consult: evaluate for VZV, HSV, rash Referring Physician: Dr. Titus Mould   HPI: [name] is an 55 y.o. female with poorly controlled diabetes mellitus with diabetic retinopathy, obstructive sleep apnea who was admitted to [facility] on February 22 with altered mental status endorsing any hallucinations. She is found to be in [place] with glucose in 939 range, without ketones. Lactic acidosis. She is febrile to over 103. Her HHNK was treated and she was initially rx with broad-spectrum antibiotics in the form of vancomycin and Zosyn from the 22nd through the 27th. Since then her mental status has been improving and her initial defect fevers that she had have defervesced. There is never a clear source identified for her fevers and her antibiotics since been stopped. In the interim she has developed some dysphasia been placed on fluconazole for thrush. 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 She also had a rash on her flank there was concern for varicella-zoster infection and she was placed on airborne precautions temporarily.  She is also further had some loose stools last night and C. difficile assay is positive for antigen but negative for toxin she had only one small bowel movement today so far.   Past Medical History  Diagnosis Date  . Diabetic neuropathy ([place])   . Cellulitis   . Thyroid disease   . Hyperlipidemia   . Neuromuscular disorder ([place])   . Concussion as a teenager    slight  . Diarrhea     pt takes Reglan tid  . Hypothyroidism     takes Synthroid daily  . Depression     takes CYmbalta daily  . MRSA (methicillin resistant staph aureus) culture positive     hx of 2012  . Staph infection 2007  . Complication of anesthesia 01/2013    "didn't know where I was; who I was; talking out the top of my head" (04/22/13)  . Hypertension   .  Peripheral vascular disease ([place])   . CHF (congestive heart failure) ([place])   . Pneumonia 1980's    "hospitalized w/it" (04-22-2013)  . Chronic bronchitis ([place])   . Orthopnea     "just one; yesterday morning" (04-22-13)  . Type II diabetes mellitus (HCC)     Novolog,Levemir,and Lovaza daily  . GERD (gastroesophageal reflux disease)   . Daily headache   . Rheumatoid arthritis ([place])     "both knees" (April 22, 2013)  . Anxiety   . Kidney stone     "passed on before" (2013/04/22)  . Non-healing wound of lower extremity     lt foot  . OSA on CPAP     and oxygen  . COPD (chronic obstructive pulmonary disease) (HCC)     wear oxygen at home  . Symptomatic bradycardia 03/04/2014    Past Surgical History  Procedure Laterality Date  . Amputation    . Leg amputation below knee Right 2012  . Toe amputation Left ?2011     only 2 toes remaning.   . Tubal ligation  1990's  . Foot surgery Right 2012    "put pins in one year; amputated toes another OR" (2013/04/22)  . Wrist surgery Right 1980's    "pinched nerve" (04-22-13)  . Dilation and curettage of uterus  1980's  . Refractive surgery Bilateral 2014  . Incision and drainage abscess N/A 01/24/2013  Procedure: INCISION AND DRAINAGE ABSCESS;  Surgeon: Joyice Faster. Cornett, MD;  Location: [place];  Service: General;  Laterality: N/A;  . Inguinal hernia repair N/A 01/28/2013    Procedure: I&D Right Groin Wound;  Surgeon: Harl Bowie, MD;  Location: [facility];  Service: General;  Laterality: N/A;  . Ganglion cyst excision Left 1990's    "wrist" (04/21/2013)  . Bladder surgery  1970's    "stretched the mouth of my bladder" (04/21/2013)  . I&d extremity Left 08/25/2013    Procedure: IRRIGATION AND DEBRIDEMENT EXTREMITY;  Surgeon: Ralene Ok, MD;  Location: [facility];  Service: General;  Laterality: Left;  . Pacemaker insertion  03/05/14    Biotronik dual chamber pacemaker implanted by Dr Caryl Comes for symptomatic bradycardia  . Permanent pacemaker insertion N/A  03/05/2014    Procedure: PERMANENT PACEMAKER INSERTION;  Surgeon: Deboraha Sprang, MD;  Location: [facility] CATH LAB;  Service: Cardiovascular;  Laterality: N/A;  . Stump revision Right 12/11/2014    Procedure: Right Below Knee Amputation Revision;  Surgeon: Newt Minion, MD;  Location: [place];  Service: Orthopedics;  Laterality: Right;    Social History:  reports that she has been smoking Cigarettes.  She has a 33 pack-year smoking history. She has never used smokeless tobacco. She reports that she does not drink alcohol or use illicit drugs.   Family History  Problem Relation Age of Onset  . Hypertension Mother   . Hyperlipidemia Mother   . Hypertrophic cardiomyopathy Mother     has pacer  . Hypertension Brother   . Hyperlipidemia Brother   . Cancer Maternal Aunt   . Diabetes Paternal Aunt   . Diabetes Paternal Uncle   . Diabetes Paternal Grandmother   . Anesthesia problems Neg Hx   . Hypotension Neg Hx   . Malignant hyperthermia Neg Hx   . Pseudochol deficiency Neg Hx   . Alzheimer's disease Father     Allergies  Allergen Reactions  . Erythromycin Diarrhea  . Iodine-131 Nausea And Vomiting  . Gadolinium Nausea And Vomiting     Code: VOM, Desc: Pt began vomiting immed post infusion of multihance, Onset Date: [id number]   . Ivp Dye [Iodinated Diagnostic Agents] Nausea And Vomiting    Nausea and vomiting   . Metformin Diarrhea     diarrhea  . Penicillins Hives    Has patient had a PCN reaction causing immediate rash, facial/tongue/throat swelling, SOB or lightheadedness with hypotension: No Has patient had a PCN reaction causing severe rash involving mucus membranes or skin necrosis: No Has patient had a PCN reaction that required hospitalization No Has patient had a PCN reaction occurring within the last 10 years: No If all of the above answers are "NO", then may proceed with Cephalosporin use.      Medications: I have reviewed patients current medications as documented in  Epic Anti-infectives    Start     Dose/Rate Route Frequency Ordered Stop   10/18/15 1200  cefTRIAXone (ROCEPHIN) 1 g in dextrose 5 % 50 mL IVPB  Status:  Discontinued     1 g 100 mL/hr over 30 Minutes Intravenous Every 24 hours 10/18/15 0930 10/19/15 1053   10/17/15 1000  fluconazole (DIFLUCAN) 40 MG/ML suspension 100 mg     100 mg Oral Daily 10/17/15 0900 10/24/15 0959   10/14/15 0100  piperacillin-tazobactam (ZOSYN) IVPB 3.375 g  Status:  Discontinued     3.375 g 12.5 mL/hr over 240 Minutes Intravenous Every 8 hours 10/14/15 0037 10/18/15  0930   10/14/15 0100  vancomycin (VANCOCIN) IVPB 750 mg/150 ml premix  Status:  Discontinued     750 mg 150 mL/hr over 60 Minutes Intravenous Every 12 hours 10/14/15 0037 10/15/15 1249         ROS: as in HPI otherwise remainder of 12 point Review of Systems is not obtainable due to patient's confusion   Blood pressure 143/69, pulse 70, temperature 98.5 F (36.9 C), temperature source Oral, resp. rate 20, height '5\' 5"'  (1.651 m), weight 201 lb 11.5 oz (91.5 kg), SpO2 96 %. General: Alert and awake,oriented to person but confused HEENT: anicteric sclera,  EOMI, could not discern thrush she has dentures on the upper  She has HSV-1 type lesions around her lips see picture 10/19/2015:      Cardiovascular: regular rate, normal r,  no murmur rubs or gallops Pulmonary: fairly clear to auscultation bilaterally, no wheezing, rales or rhonchi Gastrointestinal: soft nontender, nondistended, normal bowel sounds, Musculoskeletal: no  clubbing or edema noted bilaterally Skin: Linear rash in one of her folds posteriorly this looks to be an excoriated I do not think it looks consistent with varicella-zoster see picture to 10/19/15: If it were zoster it should really involve much more of the dermatomerather than this striated isolated area       Neuro: nonfocal, beyond her confusion   Results for orders placed or performed during the hospital encounter  of 10/13/15 (from the past 48 hour(s))  Glucose, capillary     Status: Abnormal   Collection Time: 10/17/15 12:20 PM  Result Value Ref Range   Glucose-Capillary 200 (H) 65 - 99 mg/dL  Blood gas, arterial     Status: Abnormal   Collection Time: 10/17/15  5:09 PM  Result Value Ref Range   FIO2 TEST WILL BE CREDITED    O2 Content TEST WILL BE CREDITED L/min   Delivery systems TEST WILL BE CREDITED    Mode TEST WILL BE CREDITED    VT TEST WILL BE CREDITED mL   LHR TEST WILL BE CREDITED resp/min   Hi Frequency JET Vent Rate TEST WILL BE CREDITED    Peep/cpap TEST WILL BE CREDITED cm H20   PIP TEST WILL BE CREDITED cm H2O   Hi Frequency JET Vent PIP TEST WILL BE CREDITED    Inspiratory PAP TEST WILL BE CREDITED    Expiratory PAP TEST WILL BE CREDITED    Pressure support TEST WILL BE CREDITED cm H20   Pressure control TEST WILL BE CREDITED cm H20   pH, Arterial TEST WILL BE CREDITED 7.350 - 7.450   pCO2 arterial TEST WILL BE CREDITED 35.0 - 45.0 mmHg   pO2, Arterial TEST WILL BE CREDITED 80.0 - 100.0 mmHg   Bicarbonate TEST WILL BE CREDITED 20.0 - 24.0 mEq/L   TCO2 TEST WILL BE CREDITED 0 - 100 mmol/L   Acid-Base Excess TEST WILL BE CREDITED 0.0 - 2.0 mmol/L   Acid-base deficit TEST WILL BE CREDITED 0.0 - 2.0 mmol/L   O2 Saturation TEST WILL BE CREDITED %   Patient temperature TEST WILL BE CREDITED    Amplitude TEST WILL BE CREDITED    Map TEST WILL BE CREDITED cmH20   Hertz TEST WILL BE CREDITED    Oxygen index TEST WILL BE CREDITED    Nitric Oxide TEST WILL BE CREDITED    Collection site TEST WILL BE CREDITED    Drawn by TEST WILL BE CREDITED    Sample type TEST WILL BE CREDITED  Allens test (pass/fail) TEST WILL BE CREDITED (A) PASS   Mechanical Rate TEST WILL BE CREDITED   Blood gas, arterial     Status: Abnormal   Collection Time: 10/17/15  5:09 PM  Result Value Ref Range   FIO2 0.40    Delivery systems VENTILATOR    Mode PRESSURE REGULATED VOLUME CONTROL    VT 500 mL    LHR 35.0 resp/min   Peep/cpap 5.0 cm H20   pH, Arterial 7.494 (H) 7.350 - 7.450   pCO2 arterial 18.2 (LL) 35.0 - 45.0 mmHg    Comment: CRITICAL RESULT CALLED TO, READ BACK BY AND VERIFIED WITH:  DR Lyndel Safe, MD @ 17:18, BY ACOBB RRT, RCP ON 10/17/2015    pO2, Arterial 168 (H) 80.0 - 100.0 mmHg   Bicarbonate 13.9 (L) 20.0 - 24.0 mEq/L   TCO2 14.4 0 - 100 mmol/L   O2 Saturation 98.3 %   Patient temperature 98.6    Collection site RIGHT RADIAL    Drawn by [id number]    Sample type ARTERIAL DRAW    Allens test (pass/fail) PASS PASS  Glucose, capillary     Status: Abnormal   Collection Time: 10/17/15  6:16 PM  Result Value Ref Range   Glucose-Capillary 280 (H) 65 - 99 mg/dL  Glucose, capillary     Status: Abnormal   Collection Time: 10/17/15  8:32 PM  Result Value Ref Range   Glucose-Capillary 276 (H) 65 - 99 mg/dL   Comment 1 Capillary Specimen   Glucose, capillary     Status: Abnormal   Collection Time: 10/17/15 11:46 PM  Result Value Ref Range   Glucose-Capillary 231 (H) 65 - 99 mg/dL   Comment 1 Capillary Specimen   I-STAT 3, arterial blood gas (G3+)     Status: Abnormal   Collection Time: 10/18/15  4:01 AM  Result Value Ref Range   pH, Arterial 7.475 (H) 7.350 - 7.450   pCO2 arterial 25.8 (L) 35.0 - 45.0 mmHg   pO2, Arterial 102.0 (H) 80.0 - 100.0 mmHg   Bicarbonate 18.9 (L) 20.0 - 24.0 mEq/L   TCO2 20 0 - 100 mmol/L   O2 Saturation 98.0 %   Acid-base deficit 3.0 (H) 0.0 - 2.0 mmol/L   Patient temperature 99.4 F    Collection site RADIAL, ALLEN'S TEST ACCEPTABLE    Drawn by RT    Sample type ARTERIAL   Glucose, capillary     Status: Abnormal   Collection Time: 10/18/15  4:23 AM  Result Value Ref Range   Glucose-Capillary 233 (H) 65 - 99 mg/dL   Comment 1 Venous Specimen   CBC with Differential/Platelet     Status: Abnormal   Collection Time: 10/18/15  4:41 AM  Result Value Ref Range   WBC 7.2 4.0 - 10.5 K/uL   RBC 3.90 3.87 - 5.11 MIL/uL   Hemoglobin 10.4 (L) 12.0 -  15.0 g/dL   HCT 32.2 (L) 36.0 - 46.0 %   MCV 82.6 78.0 - 100.0 fL   MCH 26.7 26.0 - 34.0 pg   MCHC 32.3 30.0 - 36.0 g/dL   RDW 14.1 11.5 - 15.5 %   Platelets 188 150 - 400 K/uL   Neutrophils Relative % 64 %   Neutro Abs 4.6 1.7 - 7.7 K/uL   Lymphocytes Relative 26 %   Lymphs Abs 1.9 0.7 - 4.0 K/uL   Monocytes Relative 9 %   Monocytes Absolute 0.7 0.1 - 1.0 K/uL   Eosinophils Relative 1 %  Eosinophils Absolute 0.1 0.0 - 0.7 K/uL   Basophils Relative 0 %   Basophils Absolute 0.0 0.0 - 0.1 K/uL  Basic metabolic panel     Status: Abnormal   Collection Time: 10/18/15  4:41 AM  Result Value Ref Range   Sodium 136 135 - 145 mmol/L   Potassium 3.5 3.5 - 5.1 mmol/L   Chloride 105 101 - 111 mmol/L   CO2 19 (L) 22 - 32 mmol/L   Glucose, Bld 239 (H) 65 - 99 mg/dL   BUN 14 6 - 20 mg/dL   Creatinine, Ser 1.36 (H) 0.44 - 1.00 mg/dL   Calcium 8.6 (L) 8.9 - 10.3 mg/dL   GFR calc non Af Amer 43 (L) >60 mL/min   GFR calc Af Amer 50 (L) >60 mL/min    Comment: (NOTE) The eGFR has been calculated using the CKD EPI equation. This calculation has not been validated in all clinical situations. eGFR's persistently <60 mL/min signify possible Chronic Kidney Disease.    Anion gap 12 5 - 15  Phosphorus     Status: None   Collection Time: 10/18/15  4:41 AM  Result Value Ref Range   Phosphorus 3.9 2.5 - 4.6 mg/dL  Magnesium     Status: None   Collection Time: 10/18/15  4:41 AM  Result Value Ref Range   Magnesium 1.8 1.7 - 2.4 mg/dL  Glucose, capillary     Status: Abnormal   Collection Time: 10/18/15  7:52 AM  Result Value Ref Range   Glucose-Capillary 211 (H) 65 - 99 mg/dL   Comment 1 Capillary Specimen   Glucose, capillary     Status: Abnormal   Collection Time: 10/18/15 12:42 PM  Result Value Ref Range   Glucose-Capillary 195 (H) 65 - 99 mg/dL   Comment 1 Capillary Specimen   Glucose, capillary     Status: Abnormal   Collection Time: 10/18/15  5:03 PM  Result Value Ref Range    Glucose-Capillary 138 (H) 65 - 99 mg/dL   Comment 1 Capillary Specimen   Glucose, capillary     Status: Abnormal   Collection Time: 10/18/15  7:57 PM  Result Value Ref Range   Glucose-Capillary 106 (H) 65 - 99 mg/dL   Comment 1 Capillary Specimen   Glucose, capillary     Status: Abnormal   Collection Time: 10/18/15 11:45 PM  Result Value Ref Range   Glucose-Capillary 131 (H) 65 - 99 mg/dL   Comment 1 Capillary Specimen   Glucose, capillary     Status: Abnormal   Collection Time: 10/19/15  4:07 AM  Result Value Ref Range   Glucose-Capillary 109 (H) 65 - 99 mg/dL   Comment 1 Capillary Specimen   CBC with Differential/Platelet     Status: Abnormal   Collection Time: 10/19/15  5:00 AM  Result Value Ref Range   WBC 10.6 (H) 4.0 - 10.5 K/uL    Comment: WHITE COUNT CONFIRMED ON SMEAR   RBC 4.52 3.87 - 5.11 MIL/uL   Hemoglobin 12.7 12.0 - 15.0 g/dL   HCT 38.4 36.0 - 46.0 %   MCV 85.0 78.0 - 100.0 fL   MCH 28.1 26.0 - 34.0 pg   MCHC 33.1 30.0 - 36.0 g/dL   RDW 14.3 11.5 - 15.5 %   Platelets 278 150 - 400 K/uL    Comment: PLATELET COUNT CONFIRMED BY SMEAR   Neutrophils Relative % 72 %   Lymphocytes Relative 17 %   Monocytes Relative 9 %   Eosinophils  Relative 2 %   Basophils Relative 0 %   Neutro Abs 7.6 1.7 - 7.7 K/uL   Lymphs Abs 1.8 0.7 - 4.0 K/uL   Monocytes Absolute 1.0 0.1 - 1.0 K/uL   Eosinophils Absolute 0.2 0.0 - 0.7 K/uL   Basophils Absolute 0.0 0.0 - 0.1 K/uL  Phosphorus     Status: Abnormal   Collection Time: 10/19/15  5:00 AM  Result Value Ref Range   Phosphorus 6.0 (H) 2.5 - 4.6 mg/dL  Magnesium     Status: None   Collection Time: 10/19/15  5:00 AM  Result Value Ref Range   Magnesium 1.9 1.7 - 2.4 mg/dL  Comprehensive metabolic panel     Status: Abnormal   Collection Time: 10/19/15  5:00 AM  Result Value Ref Range   Sodium 144 135 - 145 mmol/L    Comment: DELTA CHECK NOTED   Potassium 4.0 3.5 - 5.1 mmol/L   Chloride 108 101 - 111 mmol/L   CO2 21 (L) 22 - 32  mmol/L   Glucose, Bld 127 (H) 65 - 99 mg/dL   BUN 22 (H) 6 - 20 mg/dL   Creatinine, Ser 1.47 (H) 0.44 - 1.00 mg/dL   Calcium 9.2 8.9 - 10.3 mg/dL   Total Protein 6.8 6.5 - 8.1 g/dL   Albumin 2.4 (L) 3.5 - 5.0 g/dL   AST 33 15 - 41 U/L   ALT 17 14 - 54 U/L   Alkaline Phosphatase 130 (H) 38 - 126 U/L   Total Bilirubin 0.7 0.3 - 1.2 mg/dL   GFR calc non Af Amer 39 (L) >60 mL/min   GFR calc Af Amer 46 (L) >60 mL/min    Comment: (NOTE) The eGFR has been calculated using the CKD EPI equation. This calculation has not been validated in all clinical situations. eGFR's persistently <60 mL/min signify possible Chronic Kidney Disease.    Anion gap 15 5 - 15  C difficile quick scan w PCR reflex     Status: Abnormal   Collection Time: 10/19/15  7:52 AM  Result Value Ref Range   C Diff antigen POSITIVE (A) NEGATIVE   C Diff toxin NEGATIVE NEGATIVE   C Diff interpretation      C. difficile present, but toxin not detected. This indicates colonization. In most cases, this does not require treatment. If patient has signs and symptoms consistent with colitis, consider treatment. Requires ENTERIC precautions.  Glucose, capillary     Status: Abnormal   Collection Time: 10/19/15  9:21 AM  Result Value Ref Range   Glucose-Capillary 142 (H) 65 - 99 mg/dL   Comment 1 Capillary Specimen    '@BRIEFLABTABLE' (sdes,specrequest,cult,reptstatus)   ) Recent Results (from the past 720 hour(s))  Urine culture     Status: None   Collection Time: 10/01/15  2:49 AM  Result Value Ref Range Status   Specimen Description URINE, CATHETERIZED  Final   Special Requests NONE  Final   Culture >=100,000 COLONIES/mL ESCHERICHIA COLI  Final   Report Status 10/03/2015 FINAL  Final   Organism ID, Bacteria ESCHERICHIA COLI  Final      Susceptibility   Escherichia coli - MIC*    AMPICILLIN >=32 RESISTANT Resistant     CEFAZOLIN <=4 SENSITIVE Sensitive     CEFTRIAXONE <=1 SENSITIVE Sensitive     CIPROFLOXACIN 1 SENSITIVE  Sensitive     GENTAMICIN <=1 SENSITIVE Sensitive     IMIPENEM <=0.25 SENSITIVE Sensitive     NITROFURANTOIN 32 SENSITIVE Sensitive  TRIMETH/SULFA >=320 RESISTANT Resistant     AMPICILLIN/SULBACTAM >=32 RESISTANT Resistant     PIP/TAZO <=4 SENSITIVE Sensitive     * >=100,000 COLONIES/mL ESCHERICHIA COLI  Culture, blood (routine x 2)     Status: None   Collection Time: 10/13/15  9:45 AM  Result Value Ref Range Status   Specimen Description BLOOD LEFT HAND  Final   Special Requests BOTTLES DRAWN AEROBIC AND ANAEROBIC 5MLS  Final   Culture NO GROWTH 5 DAYS  Final   Report Status 10/18/2015 FINAL  Final  Culture, blood (routine x 2)     Status: None   Collection Time: 10/13/15  9:50 AM  Result Value Ref Range Status   Specimen Description BLOOD LEFT FOREARM  Final   Special Requests BOTTLES DRAWN AEROBIC AND ANAEROBIC 5MLS  Final   Culture NO GROWTH 5 DAYS  Final   Report Status 10/18/2015 FINAL  Final  MRSA PCR Screening     Status: None   Collection Time: 10/13/15  7:44 PM  Result Value Ref Range Status   MRSA by PCR NEGATIVE NEGATIVE Final    Comment:        The GeneXpert MRSA Assay (FDA approved for NASAL specimens only), is one component of a comprehensive MRSA colonization surveillance program. It is not intended to diagnose MRSA infection nor to guide or monitor treatment for MRSA infections.   Urine culture     Status: None   Collection Time: 10/13/15  9:00 PM  Result Value Ref Range Status   Specimen Description URINE, CATHETERIZED  Final   Special Requests NONE  Final   Culture NO GROWTH 1 DAY  Final   Report Status 10/15/2015 FINAL  Final  C difficile quick scan w PCR reflex     Status: Abnormal   Collection Time: 10/19/15  7:52 AM  Result Value Ref Range Status   C Diff antigen POSITIVE (A) NEGATIVE Final   C Diff toxin NEGATIVE NEGATIVE Final   C Diff interpretation   Final    C. difficile present, but toxin not detected. This indicates colonization. In  most cases, this does not require treatment. If patient has signs and symptoms consistent with colitis, consider treatment. Requires ENTERIC precautions.     Impression/Recommendation  Active Problems:   Hyponatremia   HTN (hypertension)   Bradycardia   Hyperglycemia   Diabetes mellitus due to underlying condition with hyperosmolarity without nonketotic hyperglycemic-hyperosmolar coma [facility]) (HCC)   CKD (chronic kidney disease) stage 2, GFR 60-89 ml/min   AKI (acute kidney injury) ([place])   Diastolic CHF ([place])   Acute encephalopathy   HHNC (hyperglycemic hyperosmolar nonketotic coma) ([place])   Acute respiratory failure with hypoxia ([place])   Charae A Coin is a 55 y.o. female with  HHNK fevers, lactic acidosis confusion who improved with treatment of her diabetes broad-spectrum antibiotics that have been taken away after she received more than 6 days of therapy without a clear source who now has developed vesicles around her lips as well as a rash on her back. Confusion does persist but is improved.  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  See below I would consider CT head with contrast when and if renal function permits to try  to rule out HSV encephalitis.   Otherwise would continue acyclovir in the interim. If she improves could consider change to high dose valtrex. If we do not rule out CNS infection with HSV then would give her 21 day course in total of acyclovir/valtrex  #3 Confusion with auditory hallucinations: Given HHNK , fevers there are multiple reasons that she might be confused. However given the fact that she has interruption of herpes simplex type I and HSV 1 is known to cause encephalopathy I think  it is reasonable start with acyclovir at central nervous system type dosing at 10 mg/kg with renal adjustment.  If she continues to improve and renal function permits would consider CT of [place] see if she has abnormalities of the temporal lobes--if she does not then she does not have HSV encephalitis.   However at present cr is trending up and ACV already may pose renal risk already  (Because she has a pacemaker we cannot get an MRI of brain)  #4 C diff + antigen, neg toxin: enteric precautions but no treatment needed  I will sign off for now  Please call us back if further clarity is needed after CNS imaging or her clinical picture changes   10/19/2015, 12:03 PM   Thank you so much for this interesting consult  [place] [facility] [place] [phone] (pager) [phone] (office) 10/19/2015, 12:03 PM  Rhina Br[place] 10/19/2015, 12:03 PM

## 2015-10-19 NOTE — Evaluation (Signed)
Clinical/Bedside Swallow Evaluation Patient Details  Name: [name] MRN: [id number] Date of Birth: [birth year]  Today's Date: 10/19/2015 Time: SLP Start Time (ACUTE ONLY): 0818 SLP Stop Time (ACUTE ONLY): 0828 SLP Time Calculation (min) (ACUTE ONLY): 10 min  Past Medical History:  Past Medical History  Diagnosis Date  . Diabetic neuropathy (HCC)   . Cellulitis   . Thyroid disease   . Hyperlipidemia   . Neuromuscular disorder (HCC)   . Concussion as a teenager    slight  . Diarrhea     pt takes Reglan tid  . Hypothyroidism     takes Synthroid daily  . Depression     takes CYmbalta daily  . MRSA (methicillin resistant staph aureus) culture positive     hx of 2012  . Staph infection 2007  . Complication of anesthesia 01/2013    "didn't know where I was; who I was; talking out the top of my head" (04/22/13)  . Hypertension   . Peripheral vascular disease (HCC)   . CHF (congestive heart failure) (HCC)   . Pneumonia 1980's    "hospitalized w/it" (Apr 22, 2013)  . Chronic bronchitis (HCC)   . Orthopnea     "just one; yesterday morning" (2013-04-22)  . Type II diabetes mellitus (HCC)     Novolog,Levemir,and Lovaza daily  . GERD (gastroesophageal reflux disease)   . Daily headache   . Rheumatoid arthritis (HCC)     "both knees" (April 22, 2013)  . Anxiety   . Kidney stone     "passed on before" (22-Apr-2013)  . Non-healing wound of lower extremity     lt foot  . OSA on CPAP     and oxygen  . COPD (chronic obstructive pulmonary disease) (HCC)     wear oxygen at home  . Symptomatic bradycardia 03/04/2014   Past Surgical History:  Past Surgical History  Procedure Laterality Date  . Amputation    . Leg amputation below knee Right 2012  . Toe amputation Left ?2011     only 2 toes remaning.   . Tubal ligation  1990's  . Foot surgery Right 2012    "put pins in one year; amputated toes another OR" (22-Apr-2013)  . Wrist surgery Right 1980's    "pinched nerve" (04-22-2013)  . Dilation  and curettage of uterus  1980's  . Refractive surgery Bilateral 2014  . Incision and drainage abscess N/A 01/24/2013    Procedure: INCISION AND DRAINAGE ABSCESS;  Surgeon: Clovis Pu. Cornett, MD;  Location: [facility] OR;  Service: General;  Laterality: N/A;  . Inguinal hernia repair N/A 01/28/2013    Procedure: I&D Right Groin Wound;  Surgeon: Shelly Rubenstein, MD;  Location: [facility] OR;  Service: General;  Laterality: N/A;  . Ganglion cyst excision Left 1990's    "wrist" (Apr 22, 2013)  . Bladder surgery  1970's    "stretched the mouth of my bladder" (Apr 22, 2013)  . I&d extremity Left 08/25/2013    Procedure: IRRIGATION AND DEBRIDEMENT EXTREMITY;  Surgeon: Axel Filler, MD;  Location: [facility] OR;  Service: General;  Laterality: Left;  . Pacemaker insertion  03/05/14    Biotronik dual chamber pacemaker implanted by Dr Graciela Husbands for symptomatic bradycardia  . Permanent pacemaker insertion N/A 03/05/2014    Procedure: PERMANENT PACEMAKER INSERTION;  Surgeon: Duke Salvia, MD;  Location: [facility] CATH LAB;  Service: Cardiovascular;  Laterality: N/A;  . Stump revision Right 12/11/2014    Procedure: Right Below Knee Amputation Revision;  Surgeon: Nadara Mustard, MD;  Location: [facility] OR;  Service: Orthopedics;  Laterality: Right;   HPI:  55 yr old with PMH: neuromuscular disorder, pna, chronic bronchitis, DM, presenting with altered mental state, auditory hallucinations (per MD note she is aware they are not real) and elevated glucose levels (300-400). CT head negative. Per MD note, brother reports chronic dysphagia. Intubated 2/22-2/27. CXR 2/27 left base atelectasis. Lungs elsewhere clear.No prior ST notes seen.   Assessment / Plan / Recommendation Clinical Impression  Pt exhibited delayed throat clearing when presented with thin liquids via straw, however further episodes prevented with removal of straw. No other overt s/s of penetration/aspiration observed during PO intake. Mastication WFL when dentures properly donned. SLP provided  mod verbal and tactile cues for pt to take small sips/bites. Pt educated re: diet recommendation of regular textures, thin liquids (no straws), meds whole in puree. SLP will f/u x1 to determine diet toleration, as prolonged intubation increases aspiration risk.    Aspiration Risk  Moderate aspiration risk    Diet Recommendation Regular;Thin liquid   Liquid Administration via: Cup;No straw Medication Administration: Whole meds with puree Supervision: Patient able to self feed;Intermittent supervision to cue for compensatory strategies Compensations: Minimize environmental distractions;Small sips/bites;Slow rate Postural Changes: Seated upright at 90 degrees    Other  Recommendations Oral Care Recommendations: Oral care BID   Follow up Recommendations   (TBD)    Frequency and Duration min 1 x/week  1 week       Prognosis Prognosis for Safe Diet Advancement: Good      Swallow Study   General HPI: 55 yr old with PMH: neuromuscular disorder, pna, chronic bronchitis, DM, presenting with altered mental state, auditory hallucinations (per MD note she is aware they are not real) and elevated glucose levels (300-400). CT head negative. Per MD note, brother reports chronic dysphagia. Intubated 2/22-2/27. CXR 2/27 left base atelectasis. Lungs elsewhere clear.No prior ST notes seen. Type of Study: Bedside Swallow Evaluation Previous Swallow Assessment: see HPI Diet Prior to this Study: Regular;Thin liquids Temperature Spikes Noted: No Respiratory Status: Nasal cannula History of Recent Intubation: Yes Length of Intubations (days): 6 days Date extubated: 10/18/15 Behavior/Cognition: Cooperative;Alert;Pleasant mood Oral Cavity Assessment: Within Functional Limits Oral Care Completed by SLP: Yes Oral Cavity - Dentition: Dentures, top Vision: Functional for self-feeding Self-Feeding Abilities: Able to feed self Patient Positioning: Upright in bed Baseline Vocal Quality: Low vocal  intensity;Hoarse Volitional Cough: Strong Volitional Swallow: Able to elicit    Oral/Motor/Sensory Function Overall Oral Motor/Sensory Function: Within functional limits   Ice Chips Ice chips: Not tested   Thin Liquid Thin Liquid: Impaired Presentation: Cup;Straw;Self Fed Oral Phase Impairments:  (none) Oral Phase Functional Implications:  (none) Pharyngeal  Phase Impairments: Throat Clearing - Delayed    Nectar Thick Nectar Thick Liquid: Not tested   Honey Thick Honey Thick Liquid: Not tested   Puree Puree: Within functional limits   Solid   GO   Solid: Within functional limits        Baran Kuhrt 10/19/2015,10:45 AM   Lynita Lombard, Student-SLP

## 2015-10-19 NOTE — Progress Notes (Signed)
eLink Physician-Brief Progress Note Patient Name: GEORGENA WEISHEIT DOB: December 04, 1960 MRN: [id number]   Date of Service  10/19/2015  HPI/Events of Note  RN calls regarding pt's chronic cough. Has been coughing since  extubated at 10am  eICU Interventions  Tessalon perles. Denies sinus issues. On H2 blocker for gerd.      Intervention Category Evaluation Type: Other  Jose Bridgette Habermann Dios 10/19/2015, 12:48 AM

## 2015-10-19 NOTE — Progress Notes (Signed)
OT Cancellation Note  Patient Details Name: ALYSSAMARIE MOUNSEY MRN: [id number] DOB: 12-21-1960   Cancelled Treatment:    Reason Eval/Treat Not Completed: Patient at procedure or test/ unavailable (working with PT / working with IV team) OT to continue to follow acutely and reattempt at more appropriate time.   Felecia Shelling   OTR/L Pager: [phone] Office: [phone] .  10/19/2015, 1:44 PM

## 2015-10-19 NOTE — Progress Notes (Signed)
Pt. Cental line has stopped pulling blood back in all ports.  All ports still flush.  MD made aware.

## 2015-10-20 DIAGNOSIS — R443 Hallucinations, unspecified: Secondary | ICD-10-CM

## 2015-10-20 DIAGNOSIS — J449 Chronic obstructive pulmonary disease, unspecified: Secondary | ICD-10-CM

## 2015-10-20 DIAGNOSIS — E039 Hypothyroidism, unspecified: Secondary | ICD-10-CM

## 2015-10-20 DIAGNOSIS — B009 Herpesviral infection, unspecified: Secondary | ICD-10-CM

## 2015-10-20 DIAGNOSIS — B370 Candidal stomatitis: Secondary | ICD-10-CM | POA: Diagnosis present

## 2015-10-20 DIAGNOSIS — G629 Polyneuropathy, unspecified: Secondary | ICD-10-CM

## 2015-10-20 LAB — BASIC METABOLIC PANEL
ANION GAP: 11 (ref 5–15)
BUN: 15 mg/dL (ref 6–20)
CHLORIDE: 102 mmol/L (ref 101–111)
CO2: 24 mmol/L (ref 22–32)
Calcium: 9 mg/dL (ref 8.9–10.3)
Creatinine, Ser: 1.08 mg/dL — ABNORMAL HIGH (ref 0.44–1.00)
GFR calc Af Amer: >60 mL/min (ref >60)
GFR calc non Af Amer: 57 mL/min — ABNORMAL LOW (ref >60)
GLUCOSE: 321 mg/dL — AB (ref 65–99)
POTASSIUM: 3.5 mmol/L (ref 3.5–5.1)
Sodium: 137 mmol/L (ref 135–145)

## 2015-10-20 LAB — BLOOD GAS, ARTERIAL
Acid-Base Excess: 1.5 mmol/L (ref 0.0–2.0)
BICARBONATE: 25.8 meq/L — AB (ref 20.0–24.0)
Drawn by: 398981
FIO2: 0.21
O2 Saturation: 91.6 %
PATIENT TEMPERATURE: 98.6
PCO2 ART: 42 mmHg (ref 35.0–45.0)
PH ART: 7.405 (ref 7.350–7.450)
PO2 ART: 69.5 mmHg — AB (ref 80.0–100.0)
TCO2: 27.1 mmol/L (ref 0–100)

## 2015-10-20 LAB — PREALBUMIN: Prealbumin: 18.3 mg/dL (ref 18–38)

## 2015-10-20 LAB — GLUCOSE, CAPILLARY
GLUCOSE-CAPILLARY: 334 mg/dL — AB (ref 65–99)
GLUCOSE-CAPILLARY: 387 mg/dL — AB (ref 65–99)
Glucose-Capillary: 300 mg/dL — ABNORMAL HIGH (ref 65–99)

## 2015-10-20 LAB — HIV ANTIBODY (ROUTINE TESTING W REFLEX): HIV Screen 4th Generation wRfx: NONREACTIVE

## 2015-10-20 MED ORDER — INSULIN GLARGINE 100 UNIT/ML ~~LOC~~ SOLN
18.0000 [IU] | Freq: Every day | SUBCUTANEOUS | Status: DC
  Administered 2015-10-21: 18 [IU] via SUBCUTANEOUS
  Filled 2015-10-20: qty 0.18

## 2015-10-20 MED ORDER — HYDRALAZINE HCL 20 MG/ML IJ SOLN: 10.0000 mg | INTRAMUSCULAR | Status: DC | PRN

## 2015-10-20 MED ORDER — ONDANSETRON HCL 4 MG/2ML IJ SOLN
4.0000 mg | Freq: Four times a day (QID) | INTRAMUSCULAR | Status: DC
  Filled 2015-10-20: qty 2

## 2015-10-20 MED ORDER — GABAPENTIN 300 MG PO CAPS
300.0000 mg | ORAL_CAPSULE | Freq: Three times a day (TID) | ORAL | Status: DC
  Administered 2015-10-20 (×2): 300 mg via ORAL
  Filled 2015-10-20 (×2): qty 1

## 2015-10-20 MED ORDER — FLUCONAZOLE 100 MG PO TABS: 100.0000 mg | ORAL_TABLET | Freq: Every day | ORAL | Status: DC

## 2015-10-20 MED ORDER — LEVOTHYROXINE SODIUM 25 MCG PO TABS
25.0000 ug | ORAL_TABLET | ORAL | Status: DC
  Filled 2015-10-20: qty 1

## 2015-10-20 MED ORDER — FAMOTIDINE 20 MG PO TABS
20.0000 mg | ORAL_TABLET | Freq: Two times a day (BID) | ORAL | Status: DC
  Administered 2015-10-20: 20 mg via ORAL
  Filled 2015-10-20: qty 1

## 2015-10-20 MED ORDER — ONDANSETRON HCL 4 MG/2ML IJ SOLN
4.0000 mg | Freq: Four times a day (QID) | INTRAMUSCULAR | Status: DC | PRN
  Administered 2015-10-20 (×2): 4 mg via INTRAVENOUS
  Filled 2015-10-20: qty 2

## 2015-10-20 MED ORDER — INSULIN ASPART 100 UNIT/ML ~~LOC~~ SOLN
0.0000 [IU] | Freq: Three times a day (TID) | SUBCUTANEOUS | Status: DC
  Administered 2015-10-21: 5 [IU] via SUBCUTANEOUS
  Administered 2015-10-21: 8 [IU] via SUBCUTANEOUS
  Administered 2015-10-21 – 2015-10-22 (×2): 11 [IU] via SUBCUTANEOUS
  Administered 2015-10-22 – 2015-10-23 (×2): 5 [IU] via SUBCUTANEOUS
  Administered 2015-10-23: 11 [IU] via SUBCUTANEOUS
  Administered 2015-10-23 – 2015-10-24 (×2): 8 [IU] via SUBCUTANEOUS
  Administered 2015-10-24: 15 [IU] via SUBCUTANEOUS
  Administered 2015-10-24: 8 [IU] via SUBCUTANEOUS
  Administered 2015-10-25: 5 [IU] via SUBCUTANEOUS
  Administered 2015-10-25: 11 [IU] via SUBCUTANEOUS

## 2015-10-20 MED ORDER — INSULIN ASPART 100 UNIT/ML ~~LOC~~ SOLN
0.0000 [IU] | Freq: Every day | SUBCUTANEOUS | Status: DC
  Administered 2015-10-20 – 2015-10-23 (×3): 3 [IU] via SUBCUTANEOUS
  Administered 2015-10-24: 4 [IU] via SUBCUTANEOUS

## 2015-10-20 MED ORDER — ONDANSETRON HCL 4 MG/2ML IJ SOLN
4.0000 mg | Freq: Once | INTRAMUSCULAR | Status: AC
  Administered 2015-10-20: 4 mg via INTRAVENOUS
  Filled 2015-10-20: qty 2

## 2015-10-20 MED ORDER — ASPIRIN EC 81 MG PO TBEC: 81.0000 mg | DELAYED_RELEASE_TABLET | Freq: Every day | ORAL | Status: DC

## 2015-10-20 NOTE — Progress Notes (Signed)
Speech Language Pathology Treatment: Dysphagia  Patient Details Name: [name] MRN: [id number] DOB: 06/27/1961 Today's Date: 10/20/2015 Time: 5852-7782 SLP Time Calculation (min) (ACUTE ONLY): 8 min  Assessment / Plan / Recommendation Clinical Impression  Pt politely declined solid PO trials due to concerns about her blood sugars, but did consume cup sips of water. She independently recalled recommendation from evaluation to not use straws as well as the rationale as to why. No overt s/s of aspiration observed as pt took small, single cup sips with Mod I. Will f/u briefly as she remains in house to better assess with solid foods as well, but do not anticipate further f/u needed upon d/c.   HPI HPI: 55 yr old with PMH: neuromuscular disorder, pna, chronic bronchitis, DM, presenting with altered mental state, auditory hallucinations (per MD note she is aware they are not real) and elevated glucose levels (300-400). CT head negative. Per MD note, brother reports chronic dysphagia. Intubated 2/22-2/27. CXR 2/27 left base atelectasis. Lungs elsewhere clear.No prior ST notes seen.      SLP Plan  Continue with current plan of care     Recommendations  Diet recommendations: Regular;Thin liquid Liquids provided via: Cup;No straw Medication Administration: Whole meds with puree Supervision: Patient able to self feed;Intermittent supervision to cue for compensatory strategies Compensations: Minimize environmental distractions;Small sips/bites;Slow rate Postural Changes and/or Swallow Maneuvers: Seated upright 90 degrees;Upright 30-60 min after meal             Oral Care Recommendations: Oral care BID Follow up Recommendations:  (tba - none anticipated) Plan: Continue with current plan of care     GO               Maxcine Ham, M.A. CCC-SLP [phone]  Maxcine Ham 10/20/2015, 9:27 AM

## 2015-10-20 NOTE — Progress Notes (Signed)
[facility] Progress Note  Patient: [name] NFA:[id number]   PCP: [name], MD DOB: [birth year]   DOA: 10/13/2015   DOS: 10/20/2015   Date of Service: the patient was seen and examined on 10/20/2015  Subjective: The patient has been remaining confused. She thinks that she is at her home. She tells the nurse that she is going to go home with the nurse. She was not able to tell me the name of the president and she was telling me the current year 1972 Nutrition: Tolerating oral diet Activity: Out of bed to chair Last BM: 10/19/2015  Assessment and Plan: 1. Diabetes mellitus due to underlying condition with hyperosmolarity without nonketotic hyperglycemic-hyperosmolar coma [facility]) Patient presented with significantly high blood glucose with encephalopathy. She was given IV fluids and IV insulin drip. Usually consulted to [facility] for admission but later on was transferred to critical care for further care. Most transition to [facility] hospitalist on 10/20/2015. Blood glucose continues to remain elevated. I would increase her Lantus to 18 units depending on her prior coverage. I would also change her sliding scale from sensitive to moderate.  2. Acute encephalopathy. Herpes simplex labialis. Initial CT of the head was unremarkable. Later on the patient developed labial rash suspicious for herpes simplex 1. Infectious diseases was consulted. The patient was started on IV acyclovir. Recommendation was to consider CT scan with contrast if there is any improvement in patient's condition. Patient's encephalopathy appears to be about the same today. I will get CT had with and without contrast to rule out any acute abnormality. I will also check EEG in the morning. Continue to closely monitor on telemetry.  3. Acute respiratory failure with hypoxia. Most likely due to acute encephalopathy currently stable on room air.  4. chronic diastolic dysfunction. Doesn't appear to be volume  overloaded after receiving IV fluids in the ICU. We will continue to closely monitor.  5. acute on chronic kidney disease. Most likely due to dehydration due to hyperglycemia. Nephrotoxic medications were initially discontinued. Currently her renal function has been back to baseline. Related to monitor renal function after giving her IV contrast.  6. IV contrast allergy. Patient does not have any allergy and appears to be intolerance causing nausea and vomiting. We will use when necessary Zofran.  7. hypothyroidism. Continue Synthroid.  8. oral thrush. On Diflucan.  9. Anemia with thrombocytopenia. Patient was having anemia in the ICU with thrombocytopenia currently all her counts are elevated. We will need to continue monitoring for hemoconcentration.  10. Essential hypertension. Blood pressure elevated. Continue hydralazine.  DVT Prophylaxis: subcutaneous Heparin Nutrition: Cardiac and diabetic diet Advance goals of care discussion: Full code  Brief Summary of Hospitalization:  HPI: As per the H and P dictated on admission, "Patient presenting with altered mental state. Patient actively endorsing auditory hallucinations. Patient states that she is "hearing stuff. " Patient states that she recognizes that she is hearing voices and knows that they are not real. States that the voices are telling her to take her medicines and to go to sleep. Patient states that her home glucose ranges from 300-400. States that she has reversed before and is being treated by her primary care doctor Dr. Reche Dixon. Patient's only complaint at this time is that her sugars are high and she occasionally has to pee but states that this is normal. " Daily update, Procedures: Echocardiogram Consultants: Critical care, infectious disease Antibiotics: Anti-infectives    Start     Dose/Rate Route Frequency  Ordered Stop   10/20/15 1200  fluconazole (DIFLUCAN) tablet 100 mg     100 mg Oral Daily 10/20/15 1154      10/19/15 1400  acyclovir (ZOVIRAX) 600 mg in dextrose 5 % 100 mL IVPB     600 mg 112 mL/hr over 60 Minutes Intravenous Every 12 hours 10/19/15 1236     10/18/15 1200  cefTRIAXone (ROCEPHIN) 1 g in dextrose 5 % 50 mL IVPB  Status:  Discontinued     1 g 100 mL/hr over 30 Minutes Intravenous Every 24 hours 10/18/15 0930 10/19/15 1053   10/17/15 1000  fluconazole (DIFLUCAN) 40 MG/ML suspension 100 mg  Status:  Discontinued     100 mg Oral Daily 10/17/15 0900 10/20/15 1154   10/14/15 0100  piperacillin-tazobactam (ZOSYN) IVPB 3.375 g  Status:  Discontinued     3.375 g 12.5 mL/hr over 240 Minutes Intravenous Every 8 hours 10/14/15 0037 10/18/15 0930   10/14/15 0100  vancomycin (VANCOCIN) IVPB 750 mg/150 ml premix  Status:  Discontinued     750 mg 150 mL/hr over 60 Minutes Intravenous Every 12 hours 10/14/15 0037 10/15/15 1249       Family Communication: family was present at bedside, at the time of interview.  Opportunity was given to ask question and all questions were answered satisfactorily.   Disposition:  Barriers to safe discharge: Improvement in mental status   Intake/Output Summary (Last 24 hours) at 10/20/15 1749 Last data filed at 10/20/15 0913  Gross per 24 hour  Intake    342 ml  Output      0 ml  Net    342 ml   Filed Weights   10/18/15 0500 10/19/15 0400 10/19/15 2115  Weight: 92.4 kg (203 lb 11.3 oz) 91.5 kg (201 lb 11.5 oz) 91.5 kg (201 lb 11.5 oz)    Objective: Physical Exam: Filed Vitals:   10/19/15 1852 10/19/15 2115 10/20/15 0443 10/20/15 0841  BP: 190/64 189/62 166/75 173/63  Pulse: 73 75 74 73  Temp: 98.3 F (36.8 C) 97.5 F (36.4 C) 98.2 F (36.8 C) 97.6 F (36.4 C)  TempSrc: Oral Oral Oral Oral  Resp: 18 18 19 20   Height:      Weight:  91.5 kg (201 lb 11.5 oz)    SpO2: 96% 94% 94% 96%     General: Appear in mild distress, oral Rash; Oral Mucosa moist. Cardiovascular: S1 and S2 Present, no Murmur, no JVD Respiratory: Bilateral Air entry  present and Clear to Auscultation, no Crackles, no wheezes Abdomen: Bowel Sound present, Soft and no tenderness Extremities: no Pedal edema, no calf tenderness Neurology: Grossly no focal neuro deficit other than confusion  Data Reviewed: CBC:  Recent Labs Lab 10/14/15 1529 10/16/15 0545 10/17/15 0440 10/18/15 0441 10/19/15 0500  WBC 11.5* 7.6 5.7 7.2 10.6*  NEUTROABS  --  5.3 3.6 4.6 7.6  HGB 10.7* 9.8* 9.3* 10.4* 12.7  HCT 31.5* 29.7* 28.5* 32.2* 38.4  MCV 81.6 82.3 82.1 82.6 85.0  PLT 147* 128* 127* 188 278   Basic Metabolic Panel:  Recent Labs Lab 10/14/15 1529 10/16/15 0545 10/17/15 0440 10/18/15 0441 10/19/15 0500 10/20/15 0654  NA 132* 141 138 136 144 137  K 3.3* 3.1* 3.4* 3.5 4.0 3.5  CL 101 108 109 105 108 102  CO2 17* 19* 18* 19* 21* 24  GLUCOSE 256* 179* 264* 239* 127* 321*  BUN 18 15 13 14  22* 15  CREATININE 1.24* 1.14* 1.28* 1.36* 1.47* 1.08*  CALCIUM 7.9*  8.0* 8.3* 8.6* 9.2 9.0  MG 2.2 2.0 1.9 1.8 1.9  --   PHOS 3.8 4.1 4.1 3.9 6.0*  --    Liver Function Tests:  Recent Labs Lab 10/14/15 0500 10/16/15 0545 10/19/15 0500  AST 41 25 33  ALT 21 15 17   ALKPHOS 95 91 130*  BILITOT 0.2* 0.6 0.7  PROT 5.7* 5.5* 6.8  ALBUMIN 2.4* 1.8* 2.4*   No results for input(s): LIPASE, AMYLASE in the last 168 hours. No results for input(s): AMMONIA in the last 168 hours.  Cardiac Enzymes:  Recent Labs Lab 10/13/15 2050 10/14/15 1529 10/15/15 0522 10/16/15 0545  CKTOTAL  --   --  344* 251*  CKMB  --   --  3.2 10.7*  TROPONINI 4.15* 1.09* 0.48*  --     BNP (last 3 results) No results for input(s): BNP in the last 8760 hours.  CBG:  Recent Labs Lab 10/19/15 1240 10/19/15 1651 10/19/15 2109 10/20/15 1200 10/20/15 1619  GLUCAP 279* 236* 284* 334* 387*    Recent Results (from the past 240 hour(s))  Culture, blood (routine x 2)     Status: None   Collection Time: 10/13/15  9:45 AM  Result Value Ref Range Status   Specimen Description BLOOD  LEFT HAND  Final   Special Requests BOTTLES DRAWN AEROBIC AND ANAEROBIC  Final   Culture NO GROWTH 5 DAYS  Final   Report Status 10/18/2015 FINAL  Final  Culture, blood (routine x 2)     Status: None   Collection Time: 10/13/15  9:50 AM  Result Value Ref Range Status   Specimen Description BLOOD LEFT FOREARM  Final   Special Requests BOTTLES DRAWN AEROBIC AND ANAEROBIC  Final   Culture NO GROWTH 5 DAYS  Final   Report Status 10/18/2015 FINAL  Final  MRSA PCR Screening     Status: None   Collection Time: 10/13/15  7:44 PM  Result Value Ref Range Status   MRSA by PCR NEGATIVE NEGATIVE Final    Comment:        The GeneXpert MRSA Assay (FDA approved for NASAL specimens only), is one component of a comprehensive MRSA colonization surveillance program. It is not intended to diagnose MRSA infection nor to guide or monitor treatment for MRSA infections.   Urine culture     Status: None   Collection Time: 10/13/15  9:00 PM  Result Value Ref Range Status   Specimen Description URINE, CATHETERIZED  Final   Special Requests NONE  Final   Culture NO GROWTH 1 DAY  Final   Report Status 10/15/2015 FINAL  Final  C difficile quick scan w PCR reflex     Status: Abnormal   Collection Time: 10/19/15  7:52 AM  Result Value Ref Range Status   C Diff antigen POSITIVE (A) NEGATIVE Final   C Diff toxin NEGATIVE NEGATIVE Final   C Diff interpretation   Final    C. difficile present, but toxin not detected. This indicates colonization. In most cases, this does not require treatment. If patient has signs and symptoms consistent with colitis, consider treatment. Requires ENTERIC precautions.     Studies: No results found.   Scheduled Meds: . acyclovir  600 mg Intravenous Q12H  . antiseptic oral rinse  7 mL Mouth Rinse BID  . aspirin EC  81 mg Oral Daily  . atorvastatin  80 mg Per Tube Daily  . benzonatate  100 mg Oral TID  . chlorhexidine  15  mL Mouth Rinse BID  . enoxaparin  (LOVENOX) injection  40 mg Subcutaneous QHS  . famotidine  20 mg Oral BID  . fluconazole  100 mg Oral Daily  . gabapentin  300 mg Oral TID  . hydrALAZINE  25 mg Oral 3 times per day  . insulin aspart  0-9 Units Subcutaneous TID AC & HS  . [START ON 10/21/2015] insulin glargine  18 Units Subcutaneous Daily  . [START ON 10/21/2015] levothyroxine  25 mcg Oral BH-q7a  . sodium chloride flush  10-40 mL Intracatheter Q12H  . sodium chloride flush  3 mL Intravenous Q12H   Continuous Infusions:  PRN Meds: acetaminophen, albuterol, magic mouthwash, ondansetron (ZOFRAN) IV, sodium chloride flush  Time spent: 30 minutes  Author: Lynden Oxford, MD [facility] Pager: [phone] 10/20/2015 5:49 PM  If 7PM-7AM, please contact night-coverage at [email or web address], password [email or web address]

## 2015-10-20 NOTE — Clinical Social Work Note (Addendum)
Clinical Social Work Assessment  Patient Details  Name: [name] MRN: [id number] Date of Birth: 23-Jul-1961  Date of referral:  10/19/2015           Reason for consult:  Discharge Planning, Facility Placement                Permission sought to share information with:  Family Supports, Case Manager Permission granted to share information::  Yes, Verbal Permission Granted  Name::     [name] [phone])  Agency::     Relationship::     Contact Information:     Housing/Transportation Living arrangements for the past 2 months:  Single Family Home Source of Information:  Patient, Parent Patient Interpreter Needed:  None Criminal Activity/Legal Involvement Pertinent to Current Situation/Hospitalization:  No - Comment as needed Significant Relationships:  Parents, Siblings Lives with:  Self Do you feel safe going back to the place where you live?  Yes Need for family participation in patient care:  No (Coment)  Care giving concerns:  Patient denies any care giving concerns at this time. Patient reports that she an aid who comes to assist her 7 days per week. Patient's mother reports that she lives in [place] and is unable to care for Patient and Patient lives home alone.    Social Worker assessment / plan:  Patient is a 55 YO Caucasian female who presented with altered mental status. CSW engaged with Patient at her bedside alongside Patient's mother. CSW introduced self, role of CSW, and began discussion regarding discharge planning. PT has recommended skilled nursing facility. Patient reports that she would like to return home upon discharge with home health instead of a skilled nursing facility. Patient reports that she an aid who comes to assist her 7 days per week. Patient's mother reports that she lives in [place] and is unable to care for Patient and Patient lives home alone. Patient has a history with [facility] and would like to receive services  from them. CSW encouraged Patient to please contact if she changes her mind and wants to go to a skilled nursing facility for rehab. CSW will continue to follow for disposition.    Employment status:  Disabled (Comment on whether or not currently receiving Disability) Insurance information:  Managed Medicare PT Recommendations:  Skilled Nursing Facility Information / Referral to community resources:     Patient/Family's Response to care:  Patient reports that she would like to return to prior level of independence with continued home health already in place. Patient appreciative of care at this time.   Patient/Family's Understanding of and Emotional Response to Diagnosis, Current Treatment, and Prognosis: Patient reports an understanding of current diagnosis, treatment, and prognosis. Patient may lack some insight into her physical health concerns.   Emotional Assessment Appearance:  Appears older than stated age Attitude/Demeanor/Rapport:   (Cooperative) Affect (typically observed):  Calm, Appropriate Orientation:  Oriented to Self, Oriented to Place, Oriented to Situation, Oriented to  Time Alcohol / Substance use:  Not Applicable Psych involvement (Current and /or in the community):  No (Comment)  Discharge Needs  Concerns to be addressed:  Discharge Planning Concerns, Care Coordination Readmission within the last 30 days:  No Current discharge risk:  Lives alone Barriers to Discharge:  No Barriers Identified   Nichole Germany, LCSW 10/20/2015, 10:00 AM

## 2015-10-20 NOTE — Progress Notes (Signed)
Inpatient Diabetes Program Recommendations  AACE/ADA: New Consensus Statement on Inpatient Glycemic Control (2015)  Target Ranges:  Prepandial:   less than 140 mg/dL      Peak postprandial:   less than 180 mg/dL (1-2 hours)      Critically ill patients:  140 - 180 mg/dL   Results for CHARNE, MCBRIEN (MRN [id number]) as of 10/20/2015 08:50  Ref. Range 10/19/2015 09:21 10/19/2015 12:40 10/19/2015 16:51 10/19/2015 21:09  Glucose-Capillary Latest Ref Range: 65-99 mg/dL 026 (H) 378 (H) 588 (H) 284 (H)   Results for MARKESHA, HANNIG (MRN [id number]) as of 10/20/2015 08:50  Ref. Range 10/20/2015 06:54  Glucose Latest Ref Range: 65-99 mg/dL 786 (H)   Review of Glycemic Control  Diabetes history: DM 2 Outpatient Diabetes medications: Levemir 90 units, Novolog 8-28 units 4x/day Current orders for Inpatient glycemic control: Lantus 10 units, Novolog Sensitive ACHS  Inpatient Diabetes Program Recommendations: Insulin - Basal: Fasting glucose elevated >200 mg/dl. Please consider increasing basal insulin to Lantus 14 units Daily.  Thanks,  Christena Deem RN, MSN, [facility] Inpatient Diabetes Coordinator Team Pager [phone] (8a-5p)

## 2015-10-20 NOTE — Evaluation (Signed)
Occupational Therapy Evaluation Patient Details Name: [name] MRN: [id number] DOB: [birth year] Today's Date: 10/20/2015    History of Present Illness [name] is an 55 y.o. female with poorly controlled diabetes mellitus with diabetic retinopathy, obstructive sleep apnea, R BKA, L toe amputation who was admitted to [facility] on February 22 with altered mental status endorsing any hallucinations. She is found to be in HHNK with glucose in 939 range, without ketones. Lactic acidosis. She is febrile to over 103   Clinical Impression   PT admitted with see above statement. Pt currently with functional limitiations due to the deficits listed below (see OT problem list). PTA was at home w/c level with (A) from aid several hours a day but majority of the time home alone. Mother lives out of town so patient must be MOD i to return home. Pt currently requires (A) for all adls and personal hygiene.  Pt will benefit from skilled OT to increase their independence and safety with adls and balance to allow discharge SNF.  Pt with cognitive deficits noted and impulsive at times during session.      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 Upon static sitting with posterior pelvic tilt and LOB posteriorly. Pt needed incr time and effort to shift weight and body anterior to counter balance with bil LE off EOB  Transfers Overall transfer level:  Needs assistance Equipment used: Rolling walker (2 wheeled) Transfers: Sit to/from Stand Sit to Stand: +2 physical assistance;Mod assist         General transfer comment: bed elevated to (A) and pt able to initate. pt able to static stand for peri care    Balance Overall balance assessment: Needs assistance Sitting-balance support: Bilateral upper extremity supported;Feet supported Sitting balance-[name]: Poor                                      ADL Overall ADL's : Needs assistance/impaired Eating/Feeding: Set up;Bed level Eating/Feeding Details (indicate cue type and reason): finishing breakfast on arrival Grooming: Wash/dry face;Min guard;Sitting               Lower Body Dressing: Maximal assistance;Bed level Lower Body Dressing Details (indicate cue type and reason): required (A) to don shoe L foot and (A) to sequence and don R prosthetic. Pt unable to recall correct order to don. pt without prosthetic completely locked and attempting to stand Toilet Transfer: +2 for physical assistance;Moderate assistance;Stand-pivot Toilet Transfer Details (indicate cue type and reason): simulated eob to chair           General ADL Comments: Pt dizziness upon eob sitting. See vitals input by RN Vonna Kotyk. Pt with elevated BP this session. pt with decr dizziness with prolonged sitting.      Vision     Perception     Praxis      Pertinent Vitals/Pain  Pain Assessment: No/denies pain (reports dizziness)     Hand Dominance Left   Extremity/Trunk Assessment Upper Extremity Assessment Upper Extremity Assessment: Generalized weakness   Lower Extremity Assessment Lower Extremity Assessment: Defer to PT evaluation;RLE deficits/detail RLE Deficits / Details: R BKA,  LLE Sensation: history of peripheral neuropathy   Cervical / Trunk Assessment Cervical / Trunk Assessment: Kyphotic   Communication Communication Communication: No difficulties   Cognition  Arousal/Alertness: Awake/alert Behavior During Therapy: Impulsive Overall Cognitive Status: Impaired/Different from baseline Area of Impairment: Awareness;Problem solving;Attention   Current Attention Level: Sustained Memory: Decreased short-term memory   Safety/Judgement: Decreased awareness of safety;Decreased awareness of deficits Awareness: Intellectual Problem Solving: Slow processing General Comments: Pt void of bowel and no awareness. Pt attempting to reach for the floor with anterior tilt and needed cues for EOB. pt demonstrates inital decr awareness to EOB sitting with posterior lean   General Comments       Exercises       Shoulder Instructions      Home Living Family/patient expects to be discharged to:: Skilled nursing facility Living Arrangements: Alone Available Help at Discharge: Personal care attendant;Available PRN/intermittently Type of Home: Apartment Home Access: Stairs to enter Entrance Stairs-Number of Steps: 2 at sidewalk with  bilateral rails and one to get in Entrance Stairs-Rails: Right;Left Home Layout: One level     Bathroom Shower/Tub: Walk-in shower         Home Equipment: Grab bars - tub/shower   Additional Comments: toilet aid      Prior Functioning/Environment Level of Independence: Independent with assistive device(s)        Comments: uses w/c mainly, walks short distances, negotiates steps at entrance    OT Diagnosis: Generalized weakness;Cognitive deficits;Altered mental status   OT Problem List: Decreased strength;Decreased activity tolerance;Impaired balance (sitting and/or standing);Decreased cognition;Decreased safety awareness;Decreased knowledge of use of DME or AE;Decreased knowledge of precautions;Cardiopulmonary status limiting activity;Obesity   OT Treatment/Interventions: Self-care/ADL training;Therapeutic exercise;DME and/or AE instruction;Energy conservation;Therapeutic activities;Cognitive  remediation/compensation;Patient/family education;Balance training    OT Goals(Current goals can be found in the care plan section) Acute Rehab OT Goals Patient Stated Goal: none stated this session by patient OT Goal Formulation: Patient unable to participate in goal setting Time For Goal Achievement: 11/03/15 Potential to Achieve Goals: Good  OT Frequency: Min 2X/week   Barriers to D/C: Decreased caregiver support  pt with aid a few hours a day adn will be home alone majority of the time in a w/c. Pt  demonstrates cognitive deficits       Co-evaluation              End of Session Equipment Utilized During Treatment: Gait belt;Rolling walker Nurse Communication: Mobility status;Precautions;Need for lift equipment  Activity Tolerance: Patient tolerated treatment well;Other (comment) (BP elevated) Patient left: in chair;with call bell/phone within reach;with chair alarm set   Time: 0835-0900 OT Time Calculation (min): 25 min Charges:  OT General Charges $OT Visit: 1 Procedure OT Evaluation $OT Eval High Complexity: 1 Procedure OT Treatments $Self Care/Home Management : 8-22 mins G-Codes:    Boone Master B 11-11-15, 10:58 AM   Mateo Flow   OTR/L Pager: [phone] Office: [phone] .

## 2015-10-21 ENCOUNTER — Inpatient Hospital Stay (HOSPITAL_COMMUNITY): Payer: Medicare HMO

## 2015-10-21 ENCOUNTER — Encounter (HOSPITAL_COMMUNITY): Payer: Self-pay | Admitting: Radiology

## 2015-10-21 DIAGNOSIS — G4733 Obstructive sleep apnea (adult) (pediatric): Secondary | ICD-10-CM

## 2015-10-21 LAB — CBC WITH DIFFERENTIAL/PLATELET
BASOS ABS: 0 10*3/uL (ref 0.0–0.1)
BASOS PCT: 0 %
EOS ABS: 0.2 10*3/uL (ref 0.0–0.7)
Eosinophils Relative: 4 %
HEMATOCRIT: 34.8 % — AB (ref 36.0–46.0)
HEMOGLOBIN: 11.7 g/dL — AB (ref 12.0–15.0)
Lymphocytes Relative: 38 %
Lymphs Abs: 2 10*3/uL (ref 0.7–4.0)
MCH: 27.9 pg (ref 26.0–34.0)
MCHC: 33.6 g/dL (ref 30.0–36.0)
MCV: 82.9 fL (ref 78.0–100.0)
MONOS PCT: 11 %
Monocytes Absolute: 0.6 10*3/uL (ref 0.1–1.0)
NEUTROS ABS: 2.5 10*3/uL (ref 1.7–7.7)
NEUTROS PCT: 47 %
Platelets: 286 10*3/uL (ref 150–400)
RBC: 4.2 MIL/uL (ref 3.87–5.11)
RDW: 13.9 % (ref 11.5–15.5)
WBC: 5.3 10*3/uL (ref 4.0–10.5)

## 2015-10-21 LAB — AMMONIA: AMMONIA: 36 umol/L — AB (ref 9–35)

## 2015-10-21 LAB — COMPREHENSIVE METABOLIC PANEL
ALBUMIN: 2.2 g/dL — AB (ref 3.5–5.0)
ALT: 17 U/L (ref 14–54)
ANION GAP: 12 (ref 5–15)
AST: 21 U/L (ref 15–41)
Alkaline Phosphatase: 112 U/L (ref 38–126)
BUN: 12 mg/dL (ref 6–20)
CO2: 25 mmol/L (ref 22–32)
Calcium: 9 mg/dL (ref 8.9–10.3)
Chloride: 103 mmol/L (ref 101–111)
Creatinine, Ser: 1.16 mg/dL — ABNORMAL HIGH (ref 0.44–1.00)
GFR calc Af Amer: >60 mL/min (ref >60)
GFR calc non Af Amer: 52 mL/min — ABNORMAL LOW (ref >60)
GLUCOSE: 289 mg/dL — AB (ref 65–99)
POTASSIUM: 3.9 mmol/L (ref 3.5–5.1)
SODIUM: 140 mmol/L (ref 135–145)
Total Bilirubin: 0.4 mg/dL (ref 0.3–1.2)
Total Protein: 5.9 g/dL — ABNORMAL LOW (ref 6.5–8.1)

## 2015-10-21 LAB — GLUCOSE, CAPILLARY
GLUCOSE-CAPILLARY: 200 mg/dL — AB (ref 65–99)
Glucose-Capillary: 244 mg/dL — ABNORMAL HIGH (ref 65–99)
Glucose-Capillary: 262 mg/dL — ABNORMAL HIGH (ref 65–99)
Glucose-Capillary: 327 mg/dL — ABNORMAL HIGH (ref 65–99)

## 2015-10-21 LAB — MAGNESIUM: Magnesium: 2 mg/dL (ref 1.7–2.4)

## 2015-10-21 LAB — HEMOGLOBIN A1C
Hgb A1c MFr Bld: 11.9 % — ABNORMAL HIGH (ref 4.8–5.6)
MEAN PLASMA GLUCOSE: 295 mg/dL

## 2015-10-21 LAB — HCV COMMENT:

## 2015-10-21 LAB — RPR: RPR Ser Ql: NONREACTIVE

## 2015-10-21 LAB — C-REACTIVE PROTEIN: CRP: 3.4 mg/dL — ABNORMAL HIGH (ref <1.0)

## 2015-10-21 LAB — PROTIME-INR
INR: 1.03 (ref 0.00–1.49)
Prothrombin Time: 13.7 seconds (ref 11.6–15.2)

## 2015-10-21 LAB — T4, FREE: FREE T4: 1.08 ng/dL (ref 0.61–1.12)

## 2015-10-21 LAB — TSH: TSH: 5.975 u[IU]/mL — AB (ref 0.350–4.500)

## 2015-10-21 LAB — SEDIMENTATION RATE: SED RATE: 119 mm/h — AB (ref 0–22)

## 2015-10-21 LAB — HEPATITIS C ANTIBODY (REFLEX)

## 2015-10-21 LAB — CORTISOL-AM, BLOOD: CORTISOL - AM: 14.8 ug/dL (ref 6.7–22.6)

## 2015-10-21 MED ORDER — IOHEXOL 300 MG/ML  SOLN
75.0000 mL | Freq: Once | INTRAMUSCULAR | Status: AC | PRN
  Administered 2015-10-21: 75 mL via INTRAVENOUS

## 2015-10-21 MED ORDER — INSULIN GLARGINE 100 UNIT/ML ~~LOC~~ SOLN
24.0000 [IU] | Freq: Every day | SUBCUTANEOUS | Status: DC
  Administered 2015-10-22 – 2015-10-23 (×2): 24 [IU] via SUBCUTANEOUS
  Filled 2015-10-21 (×2): qty 0.24

## 2015-10-21 MED ORDER — ASPIRIN 81 MG PO CHEW
81.0000 mg | CHEWABLE_TABLET | Freq: Every day | ORAL | Status: DC
  Administered 2015-10-21 – 2015-10-25 (×5): 81 mg via ORAL
  Filled 2015-10-21 (×5): qty 1

## 2015-10-21 MED ORDER — FLUCONAZOLE IN SODIUM CHLORIDE 100-0.9 MG/50ML-% IV SOLN
100.0000 mg | INTRAVENOUS | Status: DC
  Administered 2015-10-21 – 2015-10-22 (×2): 100 mg via INTRAVENOUS
  Filled 2015-10-21 (×6): qty 50

## 2015-10-21 MED ORDER — LEVOTHYROXINE SODIUM 100 MCG IV SOLR
12.5000 ug | Freq: Every day | INTRAVENOUS | Status: DC
Start: 2015-10-21 — End: 2015-10-22
  Administered 2015-10-21: 12.5 ug via INTRAVENOUS
  Filled 2015-10-21 (×3): qty 5

## 2015-10-21 MED ORDER — FAMOTIDINE 40 MG/5ML PO SUSR
20.0000 mg | Freq: Two times a day (BID) | ORAL | Status: DC
  Administered 2015-10-21: 20 mg via ORAL
  Filled 2015-10-21: qty 3
  Filled 2015-10-21 (×3): qty 2.5

## 2015-10-21 MED ORDER — ATORVASTATIN CALCIUM 80 MG PO TABS
80.0000 mg | ORAL_TABLET | Freq: Every day | ORAL | Status: DC
  Administered 2015-10-22 – 2015-10-25 (×4): 80 mg via ORAL
  Filled 2015-10-21 (×4): qty 1

## 2015-10-21 MED ORDER — ACYCLOVIR SODIUM 50 MG/ML IV SOLN
600.0000 mg | Freq: Three times a day (TID) | INTRAVENOUS | Status: DC
  Administered 2015-10-21 – 2015-10-24 (×9): 600 mg via INTRAVENOUS
  Filled 2015-10-21 (×12): qty 12

## 2015-10-21 NOTE — Progress Notes (Signed)
PT Cancellation Note  Patient Details Name: [name] MRN: [id number] DOB: 1960/09/05   Cancelled Treatment:    Reason Eval/Treat Not Completed: Fatigue/lethargy limiting ability to participate Patient too lethargic to participate with therapies this afternoon. Requires constant stimulation just to open eyes. RN notified. Will continue to follow and progress as tolerated.  [name] 10/21/2015, 3:14 PM [name], [place] [phone]

## 2015-10-21 NOTE — Progress Notes (Signed)
[facility] Progress Note  Patient: [name] ASN:[id number]   PCP: [name], MD DOB: May 03, 1961   DOA: 10/13/2015   DOS: 10/21/2015   Date of Service: the patient was seen and examined on 10/21/2015  Subjective: Patient has been more somnolent this morning. On awakening she is able to tell appropriate history she is unable to tell me whether she has any pain or not. But she is able to follow command Nutrition: Tolerating oral diet Activity: Out of bed to chair Last BM: 10/19/2015  Assessment and Plan: 1. Diabetes mellitus due to underlying condition with hyperosmolarity without nonketotic hyperglycemic-hyperosmolar coma [facility]) [facility]) Patient presented with significantly high blood glucose with encephalopathy. She was given IV fluids and IV insulin drip. Usually consulted to [facility] for admission but later on was transferred to critical care for further care. Patient was transferred to hospitalist on 10/20/2015. Blood glucose continues to remain elevated. I would increase her Lantus to 24 units  I would also change her sliding scale from sensitive to moderate. Hemoglobin A1c 11.9  2. Acute encephalopathy. Herpes simplex labialis. Initial CT of the head was unremarkable. Later on the patient developed labial rash suspicious for herpes simplex 1. Infectious diseases was consulted. The patient was started on IV acyclovir. Patient's encephalopathy appears to be worsening today.  CT of the head with and without contrast did not show any acute abnormality. EEG is currently pending. Neurology was consulted for further input. I ER was consulted for lumbar puncture. Discontinue gabapentin  3. Acute respiratory failure with hypoxia. Most likely due to acute encephalopathy currently stable on room air.  4. chronic diastolic dysfunction. Doesn't appear to be volume overloaded after receiving IV fluids in the ICU. We will continue to closely monitor.  5. acute on chronic  kidney disease. Most likely due to dehydration due to hyperglycemia. Nephrotoxic medications were initially discontinued. Currently her renal function has been back to baseline. Need to monitor renal function after giving her IV contrast.  6. IV contrast allergy. Patient does not have any allergy and appears to be intolerance causing nausea and vomiting. We will use when necessary Zofran.  7. hypothyroidism. Continue Synthroid.  8. oral thrush. On Diflucan.  9. Anemia with thrombocytopenia. Patient was having anemia in the ICU with thrombocytopenia currently all her counts are elevated. We will need to continue monitoring for hemoconcentration.  10. Essential hypertension. Blood pressure elevated. Continue hydralazine. Use when necessary hydralazine  DVT Prophylaxis: subcutaneous Heparin Nutrition: Cardiac and diabetic diet Advance goals of care discussion: Full code  Brief Summary of Hospitalization:  HPI: As per the H and P dictated on admission, "Patient presenting with altered mental state. Patient actively endorsing auditory hallucinations. Patient states that she is "hearing stuff. " Patient states that she recognizes that she is hearing voices and knows that they are not real. States that the voices are telling her to take her medicines and to go to sleep. Patient states that her home glucose ranges from 300-400. States that she has reversed before and is being treated by her primary care doctor Dr. Reche Dixon. Patient's only complaint at this time is that her sugars are high and she occasionally has to pee but states that this is normal."  Procedures: Echocardiogram Consultants: Critical care, infectious disease Antibiotics: Anti-infectives    Start     Dose/Rate Route Frequency Ordered Stop   10/21/15 1400  acyclovir (ZOVIRAX) 600 mg in dextrose 5 % 100 mL IVPB     600 mg 112  mL/hr over 60 Minutes Intravenous 3 times per day 10/21/15 1137     10/21/15 1300  fluconazole  (DIFLUCAN) IVPB 100 mg     100 mg 50 mL/hr over 60 Minutes Intravenous Every 24 hours 10/21/15 1128 10/23/15 1259   10/20/15 1200  fluconazole (DIFLUCAN) tablet 100 mg  Status:  Discontinued     100 mg Oral Daily 10/20/15 1154 10/21/15 1128   10/19/15 1400  acyclovir (ZOVIRAX) 600 mg in dextrose 5 % 100 mL IVPB  Status:  Discontinued     600 mg 112 mL/hr over 60 Minutes Intravenous Every 12 hours 10/19/15 1236 10/21/15 1137   10/18/15 1200  cefTRIAXone (ROCEPHIN) 1 g in dextrose 5 % 50 mL IVPB  Status:  Discontinued     1 g 100 mL/hr over 30 Minutes Intravenous Every 24 hours 10/18/15 0930 10/19/15 1053   10/17/15 1000  fluconazole (DIFLUCAN) 40 MG/ML suspension 100 mg  Status:  Discontinued     100 mg Oral Daily 10/17/15 0900 10/20/15 1154   10/14/15 0100  piperacillin-tazobactam (ZOSYN) IVPB 3.375 g  Status:  Discontinued     3.375 g 12.5 mL/hr over 240 Minutes Intravenous Every 8 hours 10/14/15 0037 10/18/15 0930   10/14/15 0100  vancomycin (VANCOCIN) IVPB 750 mg/150 ml premix  Status:  Discontinued     750 mg 150 mL/hr over 60 Minutes Intravenous Every 12 hours 10/14/15 0037 10/15/15 1249      Family Communication: family was present at bedside, at the time of interview.  Opportunity was given to ask question and all questions were answered satisfactorily.   Disposition:  Barriers to safe discharge: Improvement in mental status   Intake/Output Summary (Last 24 hours) at 10/21/15 1640 Last data filed at 10/21/15 1349  Gross per 24 hour  Intake    704 ml  Output      0 ml  Net    704 ml   Filed Weights   10/19/15 0400 10/19/15 2115 10/20/15 2105  Weight: 91.5 kg (201 lb 11.5 oz) 91.5 kg (201 lb 11.5 oz) 91.717 kg (202 lb 3.2 oz)    Objective: Physical Exam: Filed Vitals:   10/20/15 1757 10/20/15 2105 10/21/15 0702 10/21/15 1604  BP: 151/55 184/75 160/75 152/65  Pulse: 76 73 72 74  Temp: 98 F (36.7 C) 98.2 F (36.8 C) 98.2 F (36.8 C)   TempSrc: Oral Oral Oral     Resp: 20 20 20 18   Height:      Weight:  91.717 kg (202 lb 3.2 oz)    SpO2: 96% 91% 96% 98%     General: Appear in mild distress, oral Rash; Oral Mucosa moist. Cardiovascular: S1 and S2 Present, no Murmur, no JVD Respiratory: Bilateral Air entry present and Clear to Auscultation, no Crackles, no wheezes Abdomen: Bowel Sound present, Soft and no tenderness Extremities: no Pedal edema, no calf tenderness Neurology: Significantly somnolent as well as continues to have confusion  Data Reviewed: CBC:  Recent Labs Lab 10/16/15 0545 10/17/15 0440 10/18/15 0441 10/19/15 0500 10/21/15 0634  WBC 7.6 5.7 7.2 10.6* 5.3  NEUTROABS 5.3 3.6 4.6 7.6 2.5  HGB 9.8* 9.3* 10.4* 12.7 11.7*  HCT 29.7* 28.5* 32.2* 38.4 34.8*  MCV 82.3 82.1 82.6 85.0 82.9  PLT 128* 127* 188 278 286   Basic Metabolic Panel:  Recent Labs Lab 10/16/15 0545 10/17/15 0440 10/18/15 0441 10/19/15 0500 10/20/15 0654 10/21/15 0634  NA 141 138 136 144 137 140  K 3.1* 3.4* 3.5 4.0 3.5  3.9  CL 108 109 105 108 102 103  CO2 19* 18* 19* 21* 24 25  GLUCOSE 179* 264* 239* 127* 321* 289*  BUN 15 13 14  22* 15 12  CREATININE 1.14* 1.28* 1.36* 1.47* 1.08* 1.16*  CALCIUM 8.0* 8.3* 8.6* 9.2 9.0 9.0  MG 2.0 1.9 1.8 1.9  --  2.0  PHOS 4.1 4.1 3.9 6.0*  --   --    Liver Function Tests:  Recent Labs Lab 10/16/15 0545 10/19/15 0500 10/21/15 0634  AST 25 33 21  ALT 15 17 17   ALKPHOS 91 130* 112  BILITOT 0.6 0.7 0.4  PROT 5.5* 6.8 5.9*  ALBUMIN 1.8* 2.4* 2.2*   No results for input(s): LIPASE, AMYLASE in the last 168 hours.  Recent Labs Lab 10/21/15 0634  AMMONIA 36*    Cardiac Enzymes:  Recent Labs Lab 10/15/15 0522 10/16/15 0545  CKTOTAL 344* 251*  CKMB 3.2 10.7*  TROPONINI 0.48*  --     BNP (last 3 results) No results for input(s): BNP in the last 8760 hours.  CBG:  Recent Labs Lab 10/20/15 1200 10/20/15 1619 10/20/15 2107 10/21/15 0803 10/21/15 1157  GLUCAP 334* 387* 300* 244* 262*     Recent Results (from the past 240 hour(s))  Culture, blood (routine x 2)     Status: None   Collection Time: 10/13/15  9:45 AM  Result Value Ref Range Status   Specimen Description BLOOD LEFT HAND  Final   Special Requests BOTTLES DRAWN AEROBIC AND ANAEROBIC  Final   Culture NO GROWTH 5 DAYS  Final   Report Status 10/18/2015 FINAL  Final  Culture, blood (routine x 2)     Status: None   Collection Time: 10/13/15  9:50 AM  Result Value Ref Range Status   Specimen Description BLOOD LEFT FOREARM  Final   Special Requests BOTTLES DRAWN AEROBIC AND ANAEROBIC  Final   Culture NO GROWTH 5 DAYS  Final   Report Status 10/18/2015 FINAL  Final  MRSA PCR Screening     Status: None   Collection Time: 10/13/15  7:44 PM  Result Value Ref Range Status   MRSA by PCR NEGATIVE NEGATIVE Final    Comment:        The GeneXpert MRSA Assay (FDA approved for NASAL specimens only), is one component of a comprehensive MRSA colonization surveillance program. It is not intended to diagnose MRSA infection nor to guide or monitor treatment for MRSA infections.   Urine culture     Status: None   Collection Time: 10/13/15  9:00 PM  Result Value Ref Range Status   Specimen Description URINE, CATHETERIZED  Final   Special Requests NONE  Final   Culture NO GROWTH 1 DAY  Final   Report Status 10/15/2015 FINAL  Final  C difficile quick scan w PCR reflex     Status: Abnormal   Collection Time: 10/19/15  7:52 AM  Result Value Ref Range Status   C Diff antigen POSITIVE (A) NEGATIVE Final   C Diff toxin NEGATIVE NEGATIVE Final   C Diff interpretation   Final    C. difficile present, but toxin not detected. This indicates colonization. In most cases, this does not require treatment. If patient has signs and symptoms consistent with colitis, consider treatment. Requires ENTERIC precautions.     Studies: Ct Head W Wo Contrast  10/21/2015  CLINICAL DATA:  Follow-up acute encephalopathy. History of  diabetes, rheumatoid arthritis, concussion, hyperlipidemia, sepsis. EXAM: CT HEAD WITHOUT  AND WITH CONTRAST TECHNIQUE: Contiguous axial images were obtained from the bSmitVi629[facility],5TrRichardC15rCarlynn WirelessRelations.coMahlon GaSouth LauMemo14782MineNinettaRemonia KentuckyRich8Gemma Milinda AntEpimenio Fo55Earlene PlPaul Oliver Memorial HospitaOptic9123 6Marland Kitchen57Boston H107SmitVi629[facility],5TrRichardC26rCarlynn WirelessRelations.coMahlon GaLake14782MineNinettaRemonia KentuckyRich8Gemma Milinda AntEpimenio Fo60Earlene PlPella Regional Health CenteOptic916Marland KSmitVi629[facility],5TrRichardC55rCarlynn WirelessRelations.coMahlon GaWinfiBeltway Surgery Centers LLC Dba Me14782MineNinettaRemonia KentuckyRich8Gemma Milinda AntBear ValEpimenio Fo43Earlene PlChristus Santa Rosa Physicians Ambulatory Surgery Center New BraunfelOptic88876Marland Kitchen57GillisSmitVi629[facility],5TrRichardC62rCarlynn WirelessRelations.coMahlon GaOgSeton Medical Center Harker 14782MineNinettaRemonia KentuckyRich8Gemma Milinda AntPortEpimenio Fo14Earlene PlProvidence Hospital NortheasOptic96Marland KitSmitVi629[facility],5TrRichardC61rCarlynn WirelessRelations.coMahlon GaRenw14782MineNinettaRemonia KentuckyRich8Gemma MilindEpimenio Fo75Earlene PlNorth Hills Surgery Center LLOptic6 R6Marland KitchSmitVi629[facility],5TrRichardC32rCarlynn WirelessRelations.coMahlon GaGarPr14782MineNinettaRemonia KentuckyRich8Gemma Milinda AnEpimenio Fo30Earlene PlKindred Hospital - Denver SoutOptic65 Joy 6Marland Kitchen57Climax S3yo08Games SmitVi629[facility],5TrRichardC25rCarlynn WirelessRelations.coMahlon GaHastings14782MineNinettaRemonia KentuckyRich8Gemma Milinda Epimenio Fo2Earlene PlSt. Elizabeth HospitaOptic744 6Marland KitcSmitVi629[facility],5TrRichardC58rCarlynn Wireless14782MineNinettaRemonia KentuckyRich8Gemma Milinda AntCrEpimenio Fo66Earlene PlFish Pond Surgery CenteOptic708 Oa6Marland KitcheSmitVi629[facility],5TrRichardC26rCarlynn WirelessRelations.coMahlon GaSheppards [facility] Medic14782MineNinettaRemonia KentuckyRich8Gemma Milinda AnEpimenio Fo92Earlene PlOcean View Psychiatric Health FacilitOptic347 B6Marland KitchSmitVi629Ku [facility],5TrRichardC65rCarlynn WirelessRelations.coMahlon GaCarsonviVision One Laser An14782MineNinettaRemonia KentuckyRich8Gemma MilindEpimenio Fo63Earlene PlKentucky Correctional Psychiatric CenteOptic881 B6Marland Kitchen57SmitVi629[facility],5TrRichardC35rCarlynn WirelessRelations.coMahlon GaPigeon FaHouston Behavioral Healthca14782MineNinettaRemonia KentuckyRich8Gemma MilindEpimenio Fo76Earlene Pl[address] [facility] LLOptic99806Marland Kitchen57Abbo9SmitVi629[facility],5TrRichardC16rCarlynn WirelessRelations.coMahlon GaCenter HSouth Sound A14782MineNinettaRemonia KentuckyRich8Gemma Milinda AnEpimenio Fo84Earlene PlSt Lukes Hospital Sacred Heart CampuOptic18 S. A6Marland KitchSmitVi629[facility],5TrRichardC40rCarlynn WirelessRelations.coMahlon GaLithClarksville Surg14782MineNinettaRemonia KentuckyRich8Gemma MilEpimenio Fo82Earlene PlTexas Health Harris Methodist Hospital AlliancOptic9355 Mul6Marland Kitchen537yo08GameSmitVi629[facility],5TrRichardC13rCarlynn WirelessRelations.coMahlon GaHeHock14782MineNinettaRemonia KentuckyRich8Gemma MilindEpimenio Fo43Earlene PlRex HospitaOptic95006Marland Kitchen57Bays[place] G80yo08SmitVi629[facility],5TrRichardC24rCarlynn WirelessRelations.coMahlon GaD14782MineNinettaRemonia KentuckyRich8Gemma Milinda AntChrEpimenio Fo46Earlene PlNorth Georgia Medical CenteOptic16Marland Kitchen57MSmitVi629[facility],5TrRichardC44rCarlynn WirelessRelations.coMahlon 14782MineNinettaRemonia KentuckyRich8Gemma MilindEpimenio Fo75Earlene PlCaprock HospitaOptic9383 R6Marland Kitchen57SouSmitVi629[facility],5TrRichardC58rCarlynn WirelessRelations.coMahlon14782MineNinettaRemonia KentuckyRich8Gemma Milinda AEpimenio Fo92Earlene PlClarke County Public HospitaOptic126Marland KiSmitVi629[facility],5TrRichardC82rCarlynn WirelessRelations.coMahlon GaMorehoTulsa Er &14782MineNinettaRemonia KentuckyRich8Gemma Milinda AntLake PEpimenio Fo42Earlene PlGood Shepherd Medical Center - LindeOptic9 Oak 6Marland Kitchen57BrSmitVi629[facility],5TrRichardC28rCarlynn WirelessRelations.coMahlon GaLa RPoplar Bluff R14782MineNinettaRemonia KentuckyRich8Gemma Milinda AntTEpimenio Fo26Earlene PlSouthern Crescent Hospital For Specialty CarOptic79 P6Marland Kitchen57GunSmitVi629[facility],5TrRichardC17rCarlynn WirelessRelations.coMahlon GaBeaOpelousas Genera14782MineNinettaRemonia KentuckyRich8Gemma Milinda AntMEpimenio Fo71Earlene PlFloyd Cherokee Medical CenteOptic9959 Camb6Marland KSmitVi629[facility],5TrRichardC11rCarlynn WirelessRelations.coMahlon GaMineAl14782MineNinettaRemonia KentuckyRich8Gemma Milinda AntEpimenio Fo69Earlene PlEminent Medical CenteOptic45 North Bric6Marland Kitchen57MSmitVi629[facility],5TrRichardC68rCarlynn WirelessRelations.coMahlon GaAsh14782MineNinettaRemonia KentuckyRich8Gemma Milinda AntThe Village of Epimenio Fo47Earlene PlSanford Vermillion HospitaOptic8586Marland KiSmitVi629[facility] Dba [facility],5TrRichardC31rCarlynn WirelessRelations.coMahlon GaCat14782MineNinettaRemonia KentuckyRich8Gemma Milinda Epimenio Fo11Earlene PlAlbany Medical CenteOptic8021 6Marland Kitchen57ZepSmitVi629[facility],5TrRichardC39rCarlynn WirelessRelati14782MineNinettaRemonia KentuckyRich8Gemma Milinda AntCEpimenio Fo73Earlene PlMountain View HospitaOptic5956Marland KitSmitVi629[facility],5TrRichardC57rCarlynn WirelessRelations.coMahlon GaOn Top of the World Designated PlHoly14782MineNinettaRemonia KentuckyRich8Gemma MilindaEpimenio Fo90Earlene PlRaleigh Endoscopy Center MaiOptic6Marland Kitchen57[place]62yo08SmitVi629[facility],5TrRichardC9rCarlynn WirelessRelations.coMahlon GaLanKent County Memorial Hospitala14782MineNinettaRemonia KentuckyRich8Gemma Milinda AnEpimenio Fo42Earlene PlBridgepoint National HarboOptic560 Wa6Marland SmitVi629[facility],5TrRichardC77rCarlynn WirelessRelations.co14782MineNinettaRemonia KentuckyRich8Gemma Milinda AnEpimenio Fo22Earlene PlKaiser Foundation Hospital - VacavillOptic9406Marland KitchSmitVi629[facility] Hsptl,5TrRichardC88rCarlynn WirelessRelations.coMahlon GaArnolds PWhite 14782MineNinettaRemonia KentuckyRich8Gemma Milinda Epimenio Fo93Earlene PlAcadian Medical Center (A Campus Of Mercy Regional Medical CenterOptic78636Marland KitSmitVi629[facility],5TrRichardC20rCarlynn WirelessRelations.coMahlon GaPlatCentral Alabama Veterans14782MineNinettaRemonia KentuckyRich8Gemma Milinda AntChenEpimenio Fo63Earlene PlPenn State Hershey Rehabilitation HospitaOptic7163 Wa6Marland KiSmitVi629[facility],5TrRichardC87rCarlynn WirelessRelations.coMahlon GaCynthiUcsf Medical Center At Mi14782MineNinettaRemonia KentuckyRich8Gemma MilinEpimenio Fo76Earlene PlMercy Gilbert Medical CenteOptic96 S. K6Marland KSmitVi629[facility],5TrRichardC55rCarlynn WirelessRelations.coMahlon14782MineNinettaRemonia KentuckyRich8Gemma MilindaEpimenio Fo67Earlene PlSalem HospitaOptic7476Marland Kitchen57NortSmitVi629[facility],5TrRichardC84rCarlynn WirelessRelations.coMahlon GaIgnaMidm14782MineNinettaRemonia KentuckyRich8Gemma Milinda AntPorEpimenio Fo26Earlene PlCataract And Laser InstitutOptic92276Marland Kitchen57Las [place]73yo08Games d64SanHYSmitVi629[facility],5TrRichardC59rCarlynn WirelessRelations.coMahlon GaChief LUniversity Of Colorado Heal14782MineNinettaRemonia KentuckyRich8Gemma MilindEpimenio Fo29Earlene PlCarolina Healthcare Associates InOptic356Marland Kitchen57Lak21yo08Games d81SanHYQ657Ladene (220CordelLKentSmitVi629[facility],5TrRichardC56rCarlynn WirelessRelations.coMahlon GaKim14782MineNinettaRemonia KentuckyRich8Gemma Milinda AnEpimenio Fo46Earlene PlOutpatient Surgery Center Of [place] LLOptic666Marland Kitchen5SmitVi629[facility],5TrRichardC57rCarlynn WirelessRelations.coMahlon GaVini14782MineNinettaRemonia KentuckyRich8Gemma Milinda AnEpimenio Fo75Earlene PlBeth Israel Deaconess Hospital MiltoOptic7582 East 6Marland Kitchen57CSmitVi629[facility],5TrRichardC92rCarlynn WirelessRelations.coMahlon GaDe14782MineNinettaRemonia KentuckyRich8Gemma Milinda AntMoEpimenio Fo60Earlene PlRome Memorial HospitaOptic7858 E.6Marland KitcheSmitVi629[facility],5TrRichardC38rCarlynn WirelessRelations.coMahlon GaCasa ColorPreston Memorial HospitalaErma HeEas14782MineNinettaRemonia KentuckyRich8Gemma Milinda AntEpimenio Fo65Earlene PlFairbankOptic6Marland KitcSmitVi629[facility],5TrRichardC74rCarlynn WirelessRelations.coMahlon GaRobbinsviMercy General H14782MineNinettaRemonia KentuckyRich8Gemma Milinda AntMEpimenio Fo68Earlene PlSt. Luke'S Cornwall Hospital - Newburgh CampuOptic85686Marland KitcheSmitVi629[facility],5TrRichardC57rCarlynn WirelessRelations.coMahlon GaPalisJacksonville Endoscopy Centers LLC Dba Jacksonvi14782MineNinettaRemonia KentuckyRich8Gemma MilindaEpimenio Fo24Earlene PlCentegra Health System - Woodstock HospitaOptic9483 S. L6Marland Kitchen53yo08Games d23SanHYQ657LadenSmitVi629[facility],5TrRichardC50rCarlynn WirelessRelations.coMahlon GaEmerRocky 14782MineNinettaRemonia KentuckyRich8Gemma Milinda AnEpimenio Fo50Earlene PlKessleSmitVi629[facility],5TrRichardC48rCarlynn WirelessRelations.coMahlon GaFe14782MineNinettaRemonia KentuckyRich8Gemma Milinda Epimenio Fo64Earlene PlLexington Medical CenteOptic485 N. ArSmitVi629Va [facility],5TrRichardC45rCarlynn WirelessRelation14782MineNinettaRemonia KentuckyRich8Gemma MilEpimenio Fo73Earlene PlChildren'S Hospital Of AlabamOptic76Marland SmitVi629[facility],5TrRichardC61rCarlynn WirelessRelations.coMahlon GaMaria AntoUpstate Unive14782MineNinettaRemonia KentuckyRich8Gemma Milinda Epimenio Fo57Earlene PlSaint Michaels Medical CenteOptic7541 Vall6Marland Kitchen57CleSmitVi629[facility],5TrRichardC26rCarlynn WirelessRelations.coMahlon GaArl14782MineNinettaRemonia KentuckyRich8Gemma Milinda Epimenio Fo48Earlene PlMorgan County Arh HospitaOptic961 6MarlSmitVi629[facility],5TrRichardC17rCarlynn WirelessRelations.coMahlon14782MineNinettaRemonia KentuckyRich8Gemma Milinda Epimenio Fo20Earlene PlOsage Beach Center For Cognitive DisorderOptic694 Silve6Marland KitcheSmitVi629[facility],5TrRichardC30rCarlynn WirelessRelations.coMahlon GaBothAcuity Specialty Hospital Oh14782MineNinettaRemonia KentuckyRich8Gemma MilindaEpimenio Fo52Earlene PlKindred Hospital RomOptic176 6Marland Kitchen576SmitVi629[facility],5TrRichardC30rCarlynn WirelessRelations.coMahlon GaLaCroEmory Uni14782MineNinettaRemonia KentuckyRich8Gemma Milinda AntSewiEpimenio Fo65Earlene PlPalo Pinto General HospitaOptic57 Fa6Marland Kitchen57Caesar38yo08GamesSmitVi629[facility],5TrRichardC51rCarlynn WirelessRelations.coMahlon GaEmigrUniversity Hospitals14782MineNinettaRemonia KentuckyRich8Gemma Milinda AntEpimenio Fo40Earlene PlWestside Gi CenteOptic548 Il6MarlanSmitVi629[facility]estive Disease Center Ii,5TrRichardC84rCarlynn WirelessRelations.coMahlon GaFin14782MineNinettaRemonia KentuckyRich8Gemma Milinda AEpimenio Fo44Earlene PlLife Line HospitaOptic8584 N6Marland Kitchen57Santa77yo08Games d22SSmitVi629[facility],5TrRichardC7rCarlynn WirelessRelations.coMahlon GaBroad14782MineNinettaRemonia KentuckyRich8Gemma Milinda AnEpimenio Fo28Earlene PlLong Term Acute Care Hospital Mosaic Life Care At St. JosepOptic586Marland Kitchen57St5yo08Games dSmitVi629[facility],5TrRichardC68rCarlynn WirelessRelations.coMahlon GaCynthiRiver Vista14782MineNinettaRemonia KentuckyRich8Gemma Milinda Epimenio Fo96Earlene PlRiverside Park Surgicenter InOptic90 NE.6SmitVi629[facility],5TrRichardC27rCarlynn WirelessRelations.coMahlon GaBig SprDale Me14782MineNinettaRemonia KentuckyRich8Gemma Milinda AntEpimenio Fo24Earlene PlPrairie Ridge Hosp Hlth SerOptic86Marland Kitchen57Culve35yo08Games d93SmitVi629[facility],5TrRichardC50rCarlynn WirelessRelations.coMahlon GaKingsviVibra Rehabilitatio14782MineNinettaRemonia KentuckyRich8Gemma Milinda AntMEpimenio Fo58Earlene PlCentennial Asc LLOptic9380 Eas6Marland SmitVi629[facility],5TrRichardC59rCarlynn WirelessRelations.coMahlon GaLarim14782MineNinettaRemonia KentuckyRich8Gemma Milinda AntEpimenio Fo66Earlene PlBanner Desert Medical CenteOptic66Marland Kitchen57[place]73yo08Games d78SanHYQ657Ladene (867CordelRolling KentuckSmitVi629[facility]'S,5TrRichardC38rCarlynn WirelessRelations.coMahlon14782MineNinettaRemonia KentuckyRich8Gemma MilindaEpimenio Fo34Earlene PlNorthern Light Maine Coast HospitaOptic63 Well6Marland Kitchen57Meiner49yo08GameSmitVi629[facility],5TrRichardC75rCarlynn WirelessRelations.coMahlon GaPawnee RTifton 14782MineNinettaRemonia KentuckyRich8Gemma Milinda AEpimenio Fo50Earlene PlEliza Coffee Memorial HospitaOptic12 North N6MarlSmitVi629[facility],5TrRichardC14rCarlynn WirelessRelations.coMahl14782MineNinettaRemonia KentuckyRich8Gemma Milinda AEpimenio Fo18Earlene PlHigh Point Treatment CenteOptic896 N. Wra6Marland Kitchen5SmitVi629[facility],5TrRichardC5rCarlynn WirelessRelations.coM14782MineNinettaRemonia KentuckyRich8Gemma MilindaEpimenio Fo6Earlene PlMercy Hospital St. LouiOptic43 E. Eliz6Marland Kitchen5SmitVi629[facility],5TrRichardC46rCarlynn WirelessRelations.coMahlon GaAmity GardEss14782MineNinettaRemonia KentuckyRich8Gemma MilindEpimenio Fo36Earlene PlStar View Adolescent - P H Optic655 6Marland KSmitVi629[facility],5TrRichardC10rCarlynn WirelessRelations.coMahlon GaCross [id number] KentuckyRich8Gemma Milinda AntEpimenio Fo29Earlene PlMich[facility] ParOptic88756Marland Kitchen57Hil56yo08Games d75SanHYQ657Ladene 743CorKentucky409d71eJohn F KennedLadona Ridge67lJMarcine MatarnlerCenter LLCaErma HeTherma733 [place] StrSuQuincy Valley [id number] ulomegaly on the basis of global parenchymal brain volume loss. No intraparenchymal hemorrhage, mass effect nor midline shift. No acute large vascular territory infarcts. Mild white matter changes compatible with chronic small vessel ischemic disease. No abnormal intracranial enhancement. No abnormal extra-axial fluid collections. Basal cisterns are patent. No skull fracture. The included ocular globes and orbital contents are non-suspicious. Status post bilateral ocular lens implants. The mastoid aircells and included paranasal sinuses are well-aerated. Patient is edentulous. IMPRESSION: No acute intracranial process. Mild global parenchymal brain volume loss for age. Mild chronic small vessel ischemic disease. Electronically Signed   By: Courtnay  Bloomer M.D.   On: 10/21/2015 01:41     Scheduled Meds: . acyclovir  600 mg Intravenous 3 times per day  . antiseptic oral rinse  7 mL Mouth Rinse BID  . aspirin  81 mg Oral Daily  . [START ON 10/22/2015] atorvastatin  80 mg Oral Daily  . benzonatate  100 mg Oral TID  . chlorhexidine  15 mL Mouth Rinse BID  . famotidine  20 mg Oral BID  . fluconazole (DIFLUCAN) IV  100 mg Intravenous Q24H  . hydrALAZINE  25 mg Oral 3 times per day  . insulin aspart  0-15 Units Subcutaneous TID WC  . insulin aspart  0-5 Units Subcutaneous QHS  . insulin glargine  18 Units Subcutaneous Daily  . levothyroxine  12.5 mcg Intravenous Daily  . sodium chloride flush  3 mL Intravenous Q12H   Continuous Infusions:  PRN Meds: acetaminophen, albuterol, hydrALAZINE, magic mouthwash, ondansetron (ZOFRAN) IV  Time spent: 30  minutes  Author: Armend Hochstatter, MD [facility] Pager: [phone] 10/21/2015 4:40 PM  If 7PM-7AM, please contact night-coverage at [email or web address], password [email or web address]

## 2015-10-21 NOTE — Progress Notes (Signed)
Routine EEG completed, results pending. 

## 2015-10-21 NOTE — Consult Note (Signed)
NEURO HOSPITALIST CONSULT NOTE   Requestig physician: Dr. Allena Katz   Reason for Consult: Altered mental status  HPI:                                                                                                                                         Majority of history obtained from chart as patient is not able to provide due to somnolence.  [name] is an 55 y.o. female "diabetes mellitus with diabetic retinopathy, obstructive sleep apnea who was admitted to [facility] on February 22 with altered mental status endorsing hallucinations. She was found to be in HHNK with glucose in 939 range, without ketones. Lactic acidosis. She is febrile to over 103. Her HHNK was treated and she was initially rx with broad-spectrum antibiotics in the form of vancomycin and Zosyn from the 22nd through the 27th. Since then her mental status has been improving and her initial defect fevers that she had have defervesced. There is never a clear source identified for her fevers and her antibiotics since been stopped. In the interim she has developed some dysphasia been placed on fluconazole for thrush. 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  CT scans were negative. Primary team is considering other etiologies for acute encephalopathy so neuro was consulted. Pt has already received 2 doses of acyclovir at this point with minimal response.    Past Medical History  Diagnosis Date  . Diabetic neuropathy (HCC)   . Cellulitis   . Thyroid disease   . Hyperlipidemia   . Neuromuscular disorder (HCC)   . Concussion as a teenager    slight  . Diarrhea     pt takes Reglan tid  . Hypothyroidism     takes Synthroid daily  . Depression     takes CYmbalta daily  . MRSA (methicillin resistant staph aureus) culture positive     hx of 2012  . Staph infection 2007  .  Complication of anesthesia 01/2013    "didn't know where I was; who I was; talking out the top of my head" (04/21/2013)  . Hypertension   . Peripheral vascular disease (HCC)   . CHF (congestive heart failure) (HCC)   . Pneumonia 1980's    "hospitalized w/it" (04/21/2013)  . Chronic bronchitis (HCC)   . Orthopnea     "just one; yesterday morning" (04/21/2013)  . Type II diabetes mellitus (HCC)     Novolog,Levemir,and Lovaza daily  . GERD (gastroesophageal reflux disease)   . Daily headache   . Rheumatoid arthritis (HCC)     "both knees" (04/21/2013)  . Anxiety   . Kidney stone     "passed on  before" (04/21/2013)  . Non-healing wound of lower extremity     lt foot  . OSA on CPAP     and oxygen  . COPD (chronic obstructive pulmonary disease) (HCC)     wear oxygen at home  . Symptomatic bradycardia 03/04/2014    Past Surgical History  Procedure Laterality Date  . Amputation    . Leg amputation below knee Right 2012  . Toe amputation Left ?2011     only 2 toes remaning.   . Tubal ligation  1990's  . Foot surgery Right 2012    "put pins in one year; amputated toes another OR" (04/21/2013)  . Wrist surgery Right 1980's    "pinched nerve" (04/21/2013)  . Dilation and curettage of uterus  1980's  . Refractive surgery Bilateral 2014  . Incision and drainage abscess N/A 01/24/2013    Procedure: INCISION AND DRAINAGE ABSCESS;  Surgeon: Clovis Pu. Cornett, MD;  Location: [facility] OR;  Service: General;  Laterality: N/A;  . Inguinal hernia repair N/A 01/28/2013    Procedure: I&D Right Groin Wound;  Surgeon: Shelly Rubenstein, MD;  Location: [facility] OR;  Service: General;  Laterality: N/A;  . Ganglion cyst excision Left 1990's    "wrist" (04/21/2013)  . Bladder surgery  1970's    "stretched the mouth of my bladder" (04/21/2013)  . I&d extremity Left 08/25/2013    Procedure: IRRIGATION AND DEBRIDEMENT EXTREMITY;  Surgeon: Axel Filler, MD;  Location: [facility] OR;  Service: General;  Laterality: Left;  . Pacemaker  insertion  03/05/14    Biotronik dual chamber pacemaker implanted by Dr Graciela Husbands for symptomatic bradycardia  . Permanent pacemaker insertion N/A 03/05/2014    Procedure: PERMANENT PACEMAKER INSERTION;  Surgeon: Duke Salvia, MD;  Location: [facility] CATH LAB;  Service: Cardiovascular;  Laterality: N/A;  . Stump revision Right 12/11/2014    Procedure: Right Below Knee Amputation Revision;  Surgeon: Nadara Mustard, MD;  Location[facility] OR;  Service: Orthopedics;  Laterality: Right;    Family History  Problem Relation Age of Onset  . Hypertension Mother   . Hyperlipidemia Mother   . Hypertrophic cardiomyopathy Mother     has pacer  . Hypertension Brother   . Hyperlipidemia Brother   . Cancer Maternal Aunt   . Diabetes Paternal Aunt   . Diabetes Paternal Uncle   . Diabetes Paternal Grandmother   . Anesthesia problems Neg Hx   . Hypotension Neg Hx   . Malignant hyperthermia Neg Hx   . Pseudochol deficiency Neg Hx   . Alzheimer's disease Father      Social History:  reports that she has been smoking Cigarettes.  She has a 33 pack-year smoking history. She has never used smokeless tobacco. She reports that she does not drink alcohol or use illicit drugs.  Allergies  Allergen Reactions  . Erythromycin Diarrhea  . Iodine-131 Nausea And Vomiting  . Gadolinium Nausea And Vomiting     Code: VOM, Desc: Pt began vomiting immed post infusion of multihance, Onset Date: [id number]   . Ivp Dye [Iodinated Diagnostic Agents] Nausea And Vomiting    Nausea and vomiting   . Metformin Diarrhea     diarrhea  . Penicillins Hives    Has patient had a PCN reaction causing immediate rash, facial/tongue/throat swelling, SOB or lightheadedness with hypotension: No Has patient had a PCN reaction causing severe rash involving mucus membranes or skin necrosis: No Has patient had a PCN reaction that required hospitalization No Has patient had a PCN  reaction occurring within the last 10 years: No If all of the above  answers are "NO", then may proceed with Cephalosporin use.     MEDICATIONS:                                                                                                                     Prior to Admission:  Prescriptions prior to admission  Medication Sig Dispense Refill Last Dose  . aspirin EC 81 MG tablet Take 81 mg by mouth daily.   10/12/2015 at Unknown time  . atorvastatin (LIPITOR) 80 MG tablet Take 80 mg by mouth daily.   10/12/2015 at Unknown time  . Exenatide ER 2 MG PEN Inject 2 mg into the skin every 7 (seven) days.    unknown at unknown  . furosemide (LASIX) 40 MG tablet Take 40 mg by mouth daily.  2 10/12/2015 at Unknown time  . gabapentin (NEURONTIN) 300 MG capsule Take 300 mg by mouth 3 (three) times daily.  11 10/12/2015 at Unknown time  . insulin aspart (NOVOLOG) 100 UNIT/ML injection Inject 13 Units into the skin 3 (three) times daily with meals. (Patient taking differently: Inject 8-28 Units into the skin 4 (four) times daily. Sliding scale) 10 mL 0 10/12/2015 at Unknown time  . insulin detemir (LEVEMIR) 100 UNIT/ML injection Inject 0.77 mLs (77 Units total) into the skin daily. (Patient taking differently: Inject 90 Units into the skin daily. ) 10 mL 11 10/12/2015 at Unknown time  . levothyroxine (SYNTHROID, LEVOTHROID) 25 MCG tablet Take 25 mcg by mouth every morning.    10/12/2015 at Unknown time  . omeprazole (PRILOSEC) 20 MG capsule Take 20 mg by mouth daily.   10/12/2015 at Unknown time  . ondansetron (ZOFRAN ODT) 8 MG disintegrating tablet Take 1 tablet (8 mg total) by mouth every 8 (eight) hours as needed for nausea. 20 tablet 0 Past Month at Unknown time  . oxybutynin (DITROPAN) 5 MG tablet Take 5 mg by mouth every morning.     10/12/2015 at Unknown time  . exenatide (BYETTA) 5 MCG/0.02ML SOPN injection Inject 5 mcg into the skin 2 (two) times daily with a meal.     . nitrofurantoin, macrocrystal-monohydrate, (MACROBID) 100 MG capsule Take 1 capsule (100 mg total) by  mouth 2 (two) times daily. X 7 days (Patient not taking: Reported on 10/13/2015) 10 capsule 0   . NOVOLOG FLEXPEN 100 UNIT/ML FlexPen Take 8-25 Units by mouth daily. Sliding scale  6 Taking   Scheduled: . acyclovir  600 mg Intravenous 3 times per day  . antiseptic oral rinse  7 mL Mouth Rinse BID  . aspirin  81 mg Oral Daily  . [START ON 10/22/2015] atorvastatin  80 mg Oral Daily  . benzonatate  100 mg Oral TID  . chlorhexidine  15 mL Mouth Rinse BID  . famotidine  20 mg Oral BID  . fluconazole (DIFLUCAN) IV  100 mg Intravenous Q24H  . hydrALAZINE  25 mg Oral 3 times per day  .  insulin aspart  0-15 Units Subcutaneous TID WC  . insulin aspart  0-5 Units Subcutaneous QHS  . insulin glargine  18 Units Subcutaneous Daily  . levothyroxine  12.5 mcg Intravenous Daily  . sodium chloride flush  3 mL Intravenous Q12H     ROS:                                                                                                                                       History obtained from unobtainable from patient due to mental status    Blood pressure 160/75, pulse 72, temperature 98.2 F (36.8 C), temperature source Oral, resp. rate 20, height  (1.651 m), weight 91.717 kg (202 lb 3.2 oz), SpO2 96 %.   Neurologic Examination:                                                                                                      HEENT-  Normocephalic, no lesions, without obvious abnormality.  Normal external eye and conjunctiva.  Normal TM's bilaterally.  Normal auditory canals and external ears. Normal external nose, mucus membranes and septum.  Normal pharynx. Cardiovascular- S1, S2 normal, pulses palpable throughout   Lungs- chest clear, no wheezing, rales, normal symmetric air entry Abdomen- normal findings: bowel sounds normal Extremities- bilateral leg amputation Lymph-no adenopathy palpable Musculoskeletal-no joint tenderness, deformity or swelling Skin-warm and dry, no hyperpigmentation,  vitiligo, or suspicious lesions  Neurological Examination Mental Status: Drowsy but with sternal rub was oriented to person, time, but not place, but able to state her mom lives in [place] and recognize her mother.  Speech fluent without evidence of aphasia.  Able to follow simple commands with constant noxious stimuli.  Cranial Nerves: II:  Visual fields grossly normal, pupils equal, round, reactive to light and accommodation III,IV, VI: ptosis not present, extra-ocular motions intact bilaterally V,VII: smile symmetric, facial light touch sensation normal bilaterally VIII: hearing normal bilaterally IX,X: uvula rises symmetrically XI: bilateral shoulder shrug XII: midline tongue extension Motor: Bilateral UE with 4/5 strength. Able to lift both legs antigravity with no difficulty.  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0  PHOS 3.8 4.1 4.1 3.9 6.0*  --   --     Liver Function Tests:  Recent Labs Lab 10/16/15 0545 10/19/15 0500 10/21/15 0634  AST 25 33 21  ALT 15 17 17   ALKPHOS 91 130* 112  BILITOT 0.6 0.7 0.4  PROT 5.5* 6.8 5.9*  ALBUMIN 1.8* 2.4* 2.2*   No results for input(s): LIPASE, AMYLASE in the last 168 hours.  Recent Labs Lab  10/21/15 0634  AMMONIA 36*    CBC:  Recent Labs Lab 10/16/15 0545 10/17/15 0440 10/18/15 0441 10/19/15 0500 10/21/15 0634  WBC 7.6 5.7 7.2 10.6* 5.3  NEUTROABS 5.3 3.6 4.6 7.6 2.5  HGB 9.8* 9.3* 10.4* 12.7 11.7*  HCT 29.7* 28.5* 32.2* 38.4 34.8*  MCV 82.3 82.1 82.6 85.0 82.9  PLT 128* 127* 188 278 286    Cardiac Enzymes:  Recent Labs Lab 10/14/15 1529 10/15/15 0522 10/16/15 0545  CKTOTAL  --  344* 251*  CKMB  --  3.2 10.7*  TROPONINI 1.09* 0.48*  --     Lipid Panel: No results for input(s): CHOL, TRIG, HDL, CHOLHDL, VLDL, LDLCALC in the last 168 hours.  CBG:  Recent Labs Lab 10/20/15 1200 10/20/15 1619 10/20/15 2107 10/21/15 0803 10/21/15 1157  GLUCAP 334* 387* 300* 244* 262*    Microbiology: Results for orders placed or performed during the hospital encounter of 10/13/15  Culture, blood (routine x 2)     Status: None   Collection Time: 10/13/15  9:45 AM  Result Value Ref Range Status   Specimen Description BLOOD LEFT HAND  Final   Special Requests BOTTLES DRAWN AEROBIC AND ANAEROBIC 10/15/15  Final   Culture NO GROWTH 5 DAYS  Final   Report Status 10/18/2015 FINAL  Final  Culture, blood (routine x 2)     Status: None   Collection Time: 10/13/15  9:50 AM  Result Value Ref Range Status   Specimen Description BLOOD LEFT FOREARM  Final   Special Requests BOTTLES DRAWN AEROBIC AND ANAEROBIC 10/20/2015  Final   Culture NO GROWTH 5 DAYS  Final   Report Status 10/18/2015 FINAL  Final  MRSA PCR Screening     Status: None   Collection Time: 10/13/15  7:44 PM  Result Value Ref Range Status   MRSA by PCR NEGATIVE NEGATIVE Final    Comment:        The GeneXpert MRSA Assay (FDA approved for NASAL specimens only), is one component of a comprehensive MRSA colonization surveillance program. It is not intended to diagnose MRSA infection nor to guide or monitor treatment for MRSA infections.   Urine culture     Status: None   Collection Time: 10/13/15  9:00 PM   Result Value Ref Range Status   Specimen Description URINE, CATHETERIZED  Final   Special Requests NONE  Final   Culture NO GROWTH 1 DAY  Final   Report Status 10/15/2015 FINAL  Final  C difficile quick scan w PCR reflex     Status:  Abnormal   Collection Time: 10/19/15  7:52 AM  Result Value Ref Range Status   C Diff antigen POSITIVE (A) NEGATIVE Final   C Diff toxin NEGATIVE NEGATIVE Final   C Diff interpretation   Final    C. difficile present, but toxin not detected. This indicates colonization. In most cases, this does not require treatment. If patient has signs and symptoms consistent with colitis, consider treatment. Requires ENTERIC precautions.    Coagulation Studies:  Recent Labs  10/21/15 0634  LABPROT 13.7  INR 1.03    Imaging: Ct Head W Wo Contrast  10/21/2015  CLINICAL DATA:  Follow-up acute encephalopathy. History of diabetes, rheumatoid arthritis, concussion, hyperlipidemia, sepsis. EXAM: CT HEAD WITHOUT AND WITH CONTRAST TECHNIQUE: Contiguous axial images were obtained from the base of the skull through the vertex without and with intravenous contrast CONTRAST:  40mL OMNIPAQUE IOHEXOL 300 MG/ML  SOLN COMPARISON:  CT head October 13, 2015 FINDINGS: Mild ventriculomegaly on the basis of global parenchymal brain volume loss. No intraparenchymal hemorrhage, mass effect nor midline shift. No acute large vascular territory infarcts. Mild white matter changes compatible with chronic small vessel ischemic disease. No abnormal intracranial enhancement. No abnormal extra-axial fluid collections. Basal cisterns are patent. No skull fracture. The included ocular globes and orbital contents are non-suspicious. Status post bilateral ocular lens implants. The mastoid aircells and included paranasal sinuses are well-aerated. Patient is edentulous. IMPRESSION: No acute intracranial process. Mild global parenchymal brain volume loss for age. Mild chronic small vessel ischemic disease.  Electronically Signed   By: Awilda Metro M.D.   On: 10/21/2015 01:41     Assessment and plan per attending neurologist  Felicie Morn PA-C [facility] [phone]  10/21/2015, 12:36 PM   Assessment/Plan:

## 2015-10-21 NOTE — Progress Notes (Signed)
Pharmacy Antibiotic Note  [name] is a 55 y.o. female admitted on 10/13/2015 with sepsis.  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 Patient continues to have confusion so acyclovir to be started.  Plan: Acyclovir 10mg /kg q8h hours (600mg ) based on IBW with improvement in renal function   Height: 5\' 5"  (165.1 cm) Weight: 202 lb 3.2 oz (91.717 kg) IBW/kg (Calculated) : 57  Temp (24hrs), Avg:98.1 F (36.7 C), Min:98 F (36.7 C), Max:98.2 F (36.8 C)   Recent Labs Lab 10/14/15 1529 10/15/15 0008 10/16/15 0050 10/16/15 0545 10/17/15 0010 10/17/15 0440 10/18/15 0441 10/19/15 0500 10/20/15 0654 10/21/15 0634  WBC 11.5*  --   --  7.6  --  5.7 7.2 10.6*  --  5.3  CREATININE 1.24*  --   --  1.14*  --  1.28* 1.36* 1.47* 1.08* 1.16*  LATICACIDVEN 3.4* 2.2* 1.9  --  1.1  --   --   --   --   --     Estimated Creatinine Clearance: 62.1 mL/min (by C-G formula based on Cr of 1.16).    Allergies  Allergen Reactions  . Erythromycin Diarrhea  . Iodine-131 Nausea And Vomiting  . Gadolinium Nausea And Vomiting     Code: VOM, Desc: Pt began vomiting immed post infusion of multihance, Onset Date: 10/21/15   . Ivp Dye [Iodinated Diagnostic Agents] Nausea And Vomiting    Nausea and vomiting   . Metformin Diarrhea     diarrhea  . Penicillins Hives    Has patient had a PCN reaction causing immediate rash, facial/tongue/throat swelling, SOB or lightheadedness with hypotension: No Has patient had a PCN reaction causing severe rash involving mucus membranes or skin necrosis: No Has patient had a PCN reaction that required hospitalization No Has patient had a PCN reaction occurring within the last 10 years: No If all of the above answers are "NO", then may proceed with Cephalosporin use.     Antimicrobials this admission: 2/23 vanc>>2/24 2/23 Zosyn >>2/27 2/27 fluconazole >>3/3 2/28 acyclovir >>  Dose adjustments this admission: 3/2: acyclovir  increased from q12h to q8h  Microbiology results: 2/22 BCx x2- neg 2/22 MRSA PCR - neg 2/28 cdiff - antigen pos, toxin neg 2/22 urine - neg  Thank you for allowing pharmacy to be a part of this patient's care.  Coyle Stordahl D. Doha Boling, PharmD, BCPS Clinical Pharmacist Pager: [phone] 10/21/2015 11:41 AM

## 2015-10-21 NOTE — Progress Notes (Signed)
Late Note for 10/20/15:  Patient was up in recliner more than 2 hrs.

## 2015-10-21 NOTE — Care Management Important Message (Signed)
Important Message  Patient Details  Name: HOLLI RENGEL MRN: [id number] Date of Birth: [birth year]   Medicare Important Message Given:  Yes    Jetaime Pinnix, Annamarie Major, RN 10/21/2015, 12:02 PM

## 2015-10-22 ENCOUNTER — Inpatient Hospital Stay (HOSPITAL_COMMUNITY): Payer: Medicare HMO

## 2015-10-22 LAB — COMPREHENSIVE METABOLIC PANEL
ALK PHOS: 102 U/L (ref 38–126)
ALT: 21 U/L (ref 14–54)
ANION GAP: 11 (ref 5–15)
AST: 27 U/L (ref 15–41)
Albumin: 2.3 g/dL — ABNORMAL LOW (ref 3.5–5.0)
BILIRUBIN TOTAL: 0.5 mg/dL (ref 0.3–1.2)
BUN: 13 mg/dL (ref 6–20)
CALCIUM: 9.4 mg/dL (ref 8.9–10.3)
CO2: 28 mmol/L (ref 22–32)
Chloride: 102 mmol/L (ref 101–111)
Creatinine, Ser: 1.05 mg/dL — ABNORMAL HIGH (ref 0.44–1.00)
GFR, EST NON AFRICAN AMERICAN: 59 mL/min — AB (ref >60)
Glucose, Bld: 235 mg/dL — ABNORMAL HIGH (ref 65–99)
Potassium: 3.6 mmol/L (ref 3.5–5.1)
SODIUM: 141 mmol/L (ref 135–145)
TOTAL PROTEIN: 6.7 g/dL (ref 6.5–8.1)

## 2015-10-22 LAB — CSF CELL COUNT WITH DIFFERENTIAL
Eosinophils, CSF: NONE SEEN % (ref 0–1)
RBC COUNT CSF: 104 /mm3 — AB
Tube #: 3
WBC CSF: 4 /mm3 (ref 0–5)

## 2015-10-22 LAB — CBC WITH DIFFERENTIAL/PLATELET
Basophils Absolute: 0 10*3/uL (ref 0.0–0.1)
Basophils Relative: 0 %
EOS ABS: 0.2 10*3/uL (ref 0.0–0.7)
Eosinophils Relative: 3 %
HCT: 34.9 % — ABNORMAL LOW (ref 36.0–46.0)
HEMOGLOBIN: 11.5 g/dL — AB (ref 12.0–15.0)
LYMPHS ABS: 2.2 10*3/uL (ref 0.7–4.0)
LYMPHS PCT: 39 %
MCH: 27.5 pg (ref 26.0–34.0)
MCHC: 33 g/dL (ref 30.0–36.0)
MCV: 83.5 fL (ref 78.0–100.0)
MONOS PCT: 9 %
Monocytes Absolute: 0.5 10*3/uL (ref 0.1–1.0)
NEUTROS PCT: 49 %
Neutro Abs: 2.8 10*3/uL (ref 1.7–7.7)
Platelets: 307 10*3/uL (ref 150–400)
RBC: 4.18 MIL/uL (ref 3.87–5.11)
RDW: 13.7 % (ref 11.5–15.5)
WBC: 5.8 10*3/uL (ref 4.0–10.5)

## 2015-10-22 LAB — GLUCOSE, CAPILLARY
Glucose-Capillary: 205 mg/dL — ABNORMAL HIGH (ref 65–99)
Glucose-Capillary: 313 mg/dL — ABNORMAL HIGH (ref 65–99)
Glucose-Capillary: 272 mg/dL — ABNORMAL HIGH (ref 65–99)

## 2015-10-22 LAB — TROPONIN I
TROPONIN I: 0.08 ng/mL — AB (ref <0.031)
TROPONIN I: 0.1 ng/mL — AB (ref <0.031)
TROPONIN I: 0.07 ng/mL — AB (ref <0.031)

## 2015-10-22 LAB — MAGNESIUM: Magnesium: 1.9 mg/dL (ref 1.7–2.4)

## 2015-10-22 LAB — GLUCOSE, CSF: GLUCOSE CSF: 108 mg/dL — AB (ref 40–70)

## 2015-10-22 LAB — CRYPTOCOCCAL ANTIGEN, CSF: Crypto Ag: NEGATIVE

## 2015-10-22 LAB — PROTEIN, CSF: TOTAL PROTEIN, CSF: 56 mg/dL — AB (ref 15–45)

## 2015-10-22 MED ORDER — POTASSIUM CHLORIDE CRYS ER 20 MEQ PO TBCR
40.0000 meq | EXTENDED_RELEASE_TABLET | Freq: Once | ORAL | Status: AC
  Administered 2015-10-22: 40 meq via ORAL
  Filled 2015-10-22: qty 2

## 2015-10-22 MED ORDER — MAGNESIUM SULFATE IN D5W 10-5 MG/ML-% IV SOLN
1.0000 g | Freq: Once | INTRAVENOUS | Status: AC
  Administered 2015-10-22: 1 g via INTRAVENOUS
  Filled 2015-10-22: qty 100

## 2015-10-22 MED ORDER — LEVOTHYROXINE SODIUM 50 MCG PO TABS
50.0000 ug | ORAL_TABLET | Freq: Every day | ORAL | Status: DC
  Administered 2015-10-22 – 2015-10-25 (×4): 50 ug via ORAL
  Filled 2015-10-22 (×4): qty 1

## 2015-10-22 MED ORDER — CARVEDILOL 3.125 MG PO TABS
3.1250 mg | ORAL_TABLET | Freq: Two times a day (BID) | ORAL | Status: DC
  Administered 2015-10-22 – 2015-10-25 (×6): 3.125 mg via ORAL
  Filled 2015-10-22 (×6): qty 1

## 2015-10-22 NOTE — Progress Notes (Signed)
PT Cancellation Note  Patient Details Name: [name] MRN: [id number] DOB: [birth year]   Cancelled Treatment:    Reason Eval/Treat Not Completed: Medical issues which prohibited therapy  Other (comment) (Pt recently returned from testing and must lay flat in bed until 1700 per RN.)    Berton Mount 10/22/2015, 2:04 PM  Sunday Spillers [place], [place] [phone]

## 2015-10-22 NOTE — Progress Notes (Signed)
Sent a pharm note and called pharmacy twice regarding iv diflucan and iv MG, the first note was at approximately 1330, and still have not received the meds.

## 2015-10-22 NOTE — Progress Notes (Signed)
Patient refused CPAP for the night  

## 2015-10-22 NOTE — Progress Notes (Signed)
Inpatient Diabetes Program Recommendations  AACE/ADA: New Consensus Statement on Inpatient Glycemic Control (2015)  Target Ranges:  Prepandial:   less than 140 mg/dL      Peak postprandial:   less than 180 mg/dL (1-2 hours)      Critically ill patients:  140 - 180 mg/dL  Results for BENELLI, WINTHER (MRN [id number]) as of 10/22/2015 12:26  Ref. Range 10/21/2015 08:03 10/21/2015 11:57 10/21/2015 17:34 10/21/2015 22:02 10/22/2015 10:57  Glucose-Capillary Latest Ref Range: 65-99 mg/dL 116 (H) 579 (H) 038 (H) 200 (H) 205 (H)   Review of Glycemic Control  Current orders for Inpatient glycemic control: Lantus 24 units daily, Novolog 0-15 units TID with meals, Novolog 0-5 units QHS  Inpatient Diabetes Program Recommendations: Insulin - Basal: Noted Lantus was increased from 18 to 24 units today and patient has already received Lantus 24 units this morning. Insulin - Meal Coverage: Please consider ordering Novolog meal coverage. Recommend ordering Novolog 5 units TID with meals for meal coverage if patient is eating at least 50% of meals.  Thanks, Orlando Penner, RN, MSN, CDE Diabetes Coordinator Inpatient Diabetes Program [phone] (Team Pager from 8am to 5pm) [phone] ([facility] office) [phone] [facility] office) [phone] [facility] office)

## 2015-10-22 NOTE — Procedures (Signed)
Informed consent obtained from patient's brother.  Fluoro guided LP performed at L4-L5.  Small amount of blood noted with initial CSF collection that immediately cleared up.  Opening pressure around 13 cm.  No immediate complication.  See full report in PACS/IMAGING.

## 2015-10-22 NOTE — Progress Notes (Signed)
[facility] Progress Note  Patient: [name] GLO:[id number]   PCP: [name], MD DOB: 1961-03-21   DOA: 10/13/2015   DOS: 10/22/2015   Date of Service: the patient was seen and examined on 10/22/2015  Subjective: Patient is less drowsy and lethargic. But continues to have confusion. No complaints of chest pain or abdominal pain. Nutrition: Tolerating oral diet Activity: Out of bed to chair Last BM: 10/19/2015  Assessment and Plan: 1. Diabetes mellitus due to underlying condition with hyperosmolarity without nonketotic hyperglycemic-hyperosmolar coma [facility]) [facility]) Patient presented with significantly high blood glucose with encephalopathy. She was given IV fluids and IV insulin drip. Usually consulted to triad for admission but later on was transferred to critical care for further care. Patient was transferred to hospitalist on 10/20/2015. Blood glucose continues to remain elevated. I would continue her Lantus to 24 units  Continue sliding scale moderate. Hemoglobin A1c 11.9  2. Acute encephalopathy. Herpes simplex labialis. Initial CT of the head was unremarkable. Later on the patient developed labial rash suspicious for herpes simplex 1. Infectious diseases was consulted. The patient was started on IV acyclovir. Patient's encephalopathy appears to be worsening today.  CT of the head with and without contrast did not show any acute abnormality. EEG is currently pending. Neurology was consulted for further input. IR was consulted for lumbar puncture. Discontinue gabapentin  3. Acute respiratory failure with hypoxia. Most likely due to acute encephalopathy currently stable on room air.  4. chronic diastolic dysfunction. Doesn't appear to be volume overloaded after receiving IV fluids in the ICU. We will continue to closely monitor.  5. acute on chronic kidney disease. Most likely due to dehydration due to hyperglycemia. Nephrotoxic medications were initially  discontinued. Currently her renal function has been back to baseline. Need to monitor renal function after giving her IV contrast.  6. IV contrast allergy. Patient does not have any allergy and appears to be intolerance causing nausea and vomiting. We will use when necessary Zofran.  7. hypothyroidism. Continue Synthroid.  8. oral thrush. On Diflucan.  9. Anemia with thrombocytopenia. Patient was having anemia in the ICU with thrombocytopenia currently all her counts are elevated. We will need to continue monitoring for hemoconcentration.  10. Essential hypertension. Blood pressure elevated. Continue hydralazine. Use when necessary hydralazine  11. NSVT. Yesterday evening the patient had a couple of NSVT is on telemetry although it is difficult to verify whether they were true or not but nobody was notified. Troponin mildly elevated will follow serial troponin. Echocardiogram was performed earlier in the admission. Magnesium will be replaced. Potassium will be replaced. Continue to monitor on telemetry  DVT Prophylaxis: subcutaneous Heparin Nutrition: Cardiac and diabetic diet Advance goals of care discussion: Full code  Brief Summary of Hospitalization:  HPI: As per the H and P dictated on admission, "Patient presenting with altered mental state. Patient actively endorsing auditory hallucinations. Patient states that she is "hearing stuff. " Patient states that she recognizes that she is hearing voices and knows that they are not real. States that the voices are telling her to take her medicines and to go to sleep. Patient states that her home glucose ranges from 300-400. States that she has reversed before and is being treated by her primary care doctor Dr. Reche Dixon. Patient's only complaint at this time is that her sugars are high and she occasionally has to pee but states that this is normal."  Procedures: Echocardiogram Consultants: Critical care, infectious  disease Antibiotics: Anti-infectives  Start     Dose/Rate Route Frequency Ordered Stop   10/21/15 1400  acyclovir (ZOVIRAX) 600 mg in dextrose 5 % 100 mL IVPB     600 mg 112 mL/hr over 60 Minutes Intravenous 3 times per day 10/21/15 1137     10/21/15 1300  fluconazole (DIFLUCAN) IVPB 100 mg     100 mg 50 mL/hr over 60 Minutes Intravenous Every 24 hours 10/21/15 1128 10/23/15 2359   10/20/15 1200  fluconazole (DIFLUCAN) tablet 100 mg  Status:  Discontinued     100 mg Oral Daily 10/20/15 1154 10/21/15 1128   10/19/15 1400  acyclovir (ZOVIRAX) 600 mg in dextrose 5 % 100 mL IVPB  Status:  Discontinued     600 mg 112 mL/hr over 60 Minutes Intravenous Every 12 hours 10/19/15 1236 10/21/15 1137   10/18/15 1200  cefTRIAXone (ROCEPHIN) 1 g in dextrose 5 % 50 mL IVPB  Status:  Discontinued     1 g 100 mL/hr over 30 Minutes Intravenous Every 24 hours 10/18/15 0930 10/19/15 1053   10/17/15 1000  fluconazole (DIFLUCAN) 40 MG/ML suspension 100 mg  Status:  Discontinued     100 mg Oral Daily 10/17/15 0900 10/20/15 1154   10/14/15 0100  piperacillin-tazobactam (ZOSYN) IVPB 3.375 g  Status:  Discontinued     3.375 g 12.5 mL/hr over 240 Minutes Intravenous Every 8 hours 10/14/15 0037 10/18/15 0930   10/14/15 0100  vancomycin (VANCOCIN) IVPB 750 mg/150 ml premix  Status:  Discontinued     750 mg 150 mL/hr over 60 Minutes Intravenous Every 12 hours 10/14/15 0037 10/15/15 1249      Family Communication: family was present at bedside, at the time of interview.  Opportunity was given to ask question and all questions were answered satisfactorily.   Disposition:  Barriers to safe discharge: Improvement in mental status   Intake/Output Summary (Last 24 hours) at 10/22/15 1740 Last data filed at 10/22/15 1721  Gross per 24 hour  Intake    704 ml  Output    150 ml  Net    554 ml   Filed Weights   10/19/15 2115 10/20/15 2105 10/21/15 2036  Weight: 91.5 kg (201 lb 11.5 oz) 91.717 kg (202 lb 3.2 oz)  92.352 kg (203 lb 9.6 oz)    Objective: Physical Exam: Filed Vitals:   10/21/15 2036 10/22/15 0418 10/22/15 1209 10/22/15 1708  BP: 157/77 154/77 153/55 156/57  Pulse: 71 73 78 75  Temp: 98.2 F (36.8 C) 98.6 F (37 C) 97.6 F (36.4 C) 98 F (36.7 C)  TempSrc: Oral  Oral Oral  Resp: 16 23 22 20   Height:      Weight: 92.352 kg (203 lb 9.6 oz)     SpO2: 98% 96% 93% 95%    General: Appear in mild distress, oral Rash; Oral Mucosa moist. Cardiovascular: S1 and S2 Present, no Murmur, no JVD Respiratory: Bilateral Air entry present and Clear to Auscultation, no Crackles, no wheezes Abdomen: Bowel Sound present, Soft and no tenderness Extremities: no Pedal edema, no calf tenderness Neurology: Improvement in somnolent as well as continues to have confusion  Data Reviewed: CBC:  Recent Labs Lab 10/17/15 0440 10/18/15 0441 10/19/15 0500 10/21/15 0634 10/22/15 1018  WBC 5.7 7.2 10.6* 5.3 5.8  NEUTROABS 3.6 4.6 7.6 2.5 2.8  HGB 9.3* 10.4* 12.7 11.7* 11.5*  HCT 28.5* 32.2* 38.4 34.8* 34.9*  MCV 82.1 82.6 85.0 82.9 83.5  PLT 127* 188 278 286 307   Basic Metabolic  Panel:  Recent Labs Lab 10/16/15 0545 10/17/15 0440 10/18/15 0441 10/19/15 0500 10/20/15 0654 10/21/15 0634 10/22/15 1018  NA 141 138 136 144 137 140 141  K 3.1* 3.4* 3.5 4.0 3.5 3.9 3.6  CL 108 109 105 108 102 103 102  CO2 19* 18* 19* 21* 24 25 28   GLUCOSE 179* 264* 239* 127* 321* 289* 235*  BUN 15 13 14  22* 15 12 13   CREATININE 1.14* 1.28* 1.36* 1.47* 1.08* 1.16* 1.05*  CALCIUM 8.0* 8.3* 8.6* 9.2 9.0 9.0 9.4  MG 2.0 1.9 1.8 1.9  --  2.0 1.9  PHOS 4.1 4.1 3.9 6.0*  --   --   --    Liver Function Tests:  Recent Labs Lab 10/16/15 0545 10/19/15 0500 10/21/15 0634 10/22/15 1018  AST 25 33 21 27  ALT 15 17 17 21   ALKPHOS 91 130* 112 102  BILITOT 0.6 0.7 0.4 0.5  PROT 5.5* 6.8 5.9* 6.7  ALBUMIN 1.8* 2.4* 2.2* 2.3*   No results for input(s): LIPASE, AMYLASE in the last 168 hours.  Recent  Labs Lab 10/21/15 0634  AMMONIA 36*    Cardiac Enzymes:  Recent Labs Lab 10/16/15 0545 10/22/15 1018  CKTOTAL 251*  --   CKMB 10.7*  --   TROPONINI  --  0.08*    BNP (last 3 results) No results for input(s): BNP in the last 8760 hours.  CBG:  Recent Labs Lab 10/21/15 1157 10/21/15 1734 10/21/15 2202 10/22/15 1057 10/22/15 1646  GLUCAP 262* 327* 200* 205* 313*    Recent Results (from the past 240 hour(s))  Culture, blood (routine x 2)     Status: None   Collection Time: 10/13/15  9:45 AM  Result Value Ref Range Status   Specimen Description BLOOD LEFT HAND  Final   Special Requests BOTTLES DRAWN AEROBIC AND ANAEROBIC  Final   Culture NO GROWTH 5 DAYS  Final   Report Status 10/18/2015 FINAL  Final  Culture, blood (routine x 2)     Status: None   Collection Time: 10/13/15  9:50 AM  Result Value Ref Range Status   Specimen Description BLOOD LEFT FOREARM  Final   Special Requests BOTTLES DRAWN AEROBIC AND ANAEROBIC  Final   Culture NO GROWTH 5 DAYS  Final   Report Status 10/18/2015 FINAL  Final  MRSA PCR Screening     Status: None   Collection Time: 10/13/15  7:44 PM  Result Value Ref Range Status   MRSA by PCR NEGATIVE NEGATIVE Final    Comment:        The GeneXpert MRSA Assay (FDA approved for NASAL specimens only), is one component of a comprehensive MRSA colonization surveillance program. It is not intended to diagnose MRSA infection nor to guide or monitor treatment for MRSA infections.   Urine culture     Status: None   Collection Time: 10/13/15  9:00 PM  Result Value Ref Range Status   Specimen Description URINE, CATHETERIZED  Final   Special Requests NONE  Final   Culture NO GROWTH 1 DAY  Final   Report Status 10/15/2015 FINAL  Final  C difficile quick scan w PCR reflex     Status: Abnormal   Collection Time: 10/19/15  7:52 AM  Result Value Ref Range Status   C Diff antigen POSITIVE (A) NEGATIVE Final   C Diff toxin NEGATIVE  NEGATIVE Final   C Diff interpretation   Final    C. difficile present, but toxin  not detected. This indicates colonization. In most cases, this does not require treatment. If patient has signs and symptoms consistent with colitis, consider treatment. Requires ENTERIC precautions.  CSF culture     Status: None (Preliminary result)   Collection Time: 10/22/15  9:15 AM  Result Value Ref Range Status   Specimen Description CSF  Final   Special Requests NONE  Final   Gram Stain   Final    CYTOSPIN SMEAR WBC PRESENT, PREDOMINANTLY MONONUCLEAR NO ORGANISMS SEEN    Culture PENDING  Incomplete   Report Status PENDING  Incomplete     Studies: Dg Fluoro Guide Lumbar Puncture  10/22/2015  CLINICAL DATA:  Acute encephalopathy. EXAM: DIAGNOSTIC LUMBAR PUNCTURE UNDER FLUOROSCOPIC GUIDANCE FLUOROSCOPY TIME:  If the device does not provide the exposure index: Fluoroscopy Time (in minutes and seconds):  26 seconds Number of Acquired Images:  2 PROCEDURE: Informed consent was obtained from the patient's brother because the patient is unable to give consent due to the encephalopathy. The risks of the procedure were discussed and informed consent was obtained over the telephone. With the patient prone, the lower back was prepped with Betadine. 1% Lidocaine was used for local anesthesia. Lumbar puncture was performed at the L4-L5 level using a 20 gauge needle. Only a trace amount of CSF was noted in the needle hub and this was probably related to the length of the needle and the patient's body habitus. The needle was repositioned a few times. Eventually, the table was elevated in order to help CSF drainage. Initially, a small amount of blood tinged CSF was noted but the CSF quickly cleared. 10.5 mL of CSF was removed. Opening pressure was approximately 13 cm. The patient tolerated the procedure well and there were no apparent complications. IMPRESSION: Successful fluoroscopic guided lumbar puncture. Electronically  Signed   By: Richarda Overlie M.D.   On: 10/22/2015 10:05     Scheduled Meds: . acyclovir  600 mg Intravenous 3 times per day  . antiseptic oral rinse  7 mL Mouth Rinse BID  . aspirin  81 mg Oral Daily  . atorvastatin  80 mg Oral Daily  . carvedilol  3.125 mg Oral BID WC  . chlorhexidine  15 mL Mouth Rinse BID  . fluconazole (DIFLUCAN) IV  100 mg Intravenous Q24H  . hydrALAZINE  25 mg Oral 3 times per day  . insulin aspart  0-15 Units Subcutaneous TID WC  . insulin aspart  0-5 Units Subcutaneous QHS  . insulin glargine  24 Units Subcutaneous Daily  . levothyroxine  50 mcg Oral QAC breakfast  . magnesium sulfate 1 - 4 g bolus IVPB  1 g Intravenous Once  . sodium chloride flush  3 mL Intravenous Q12H   Continuous Infusions:  PRN Meds: acetaminophen, albuterol, magic mouthwash, ondansetron (ZOFRAN) IV  Time spent: 30 minutes  Author: Lynden Oxford, MD [facility] Pager: [phone] 10/22/2015 5:40 PM  If 7PM-7AM, please contact night-coverage at [email or web address], password [facility]

## 2015-10-22 NOTE — Progress Notes (Signed)
OT Cancellation Note  Patient Details Name: [name] MRN: [id number] DOB: 01/16/1961   Cancelled Treatment:    Reason Eval/Treat Not Completed: Patient at procedure or test/ unavailable;Other (comment) (Pt recently returned from testing and must lay flat in bed until  1700 per RN.)   Lowella Grip, MS, OTR/L 10/22/2015, 1:49 PM

## 2015-10-23 LAB — GLUCOSE, CAPILLARY
GLUCOSE-CAPILLARY: 243 mg/dL — AB (ref 65–99)
GLUCOSE-CAPILLARY: 294 mg/dL — AB (ref 65–99)
GLUCOSE-CAPILLARY: 300 mg/dL — AB (ref 65–99)
Glucose-Capillary: 322 mg/dL — ABNORMAL HIGH (ref 65–99)

## 2015-10-23 MED ORDER — FLUCONAZOLE IN SODIUM CHLORIDE 100-0.9 MG/50ML-% IV SOLN
100.0000 mg | INTRAVENOUS | Status: DC
  Administered 2015-10-23: 100 mg via INTRAVENOUS
  Filled 2015-10-23 (×2): qty 50

## 2015-10-23 MED ORDER — INSULIN GLARGINE 100 UNIT/ML ~~LOC~~ SOLN
6.0000 [IU] | Freq: Once | SUBCUTANEOUS | Status: AC
  Administered 2015-10-23: 6 [IU] via SUBCUTANEOUS
  Filled 2015-10-23: qty 0.06

## 2015-10-23 MED ORDER — HYDRALAZINE HCL 20 MG/ML IJ SOLN
10.0000 mg | Freq: Four times a day (QID) | INTRAMUSCULAR | Status: DC | PRN
  Filled 2015-10-23: qty 1

## 2015-10-23 MED ORDER — INSULIN GLARGINE 100 UNIT/ML ~~LOC~~ SOLN
30.0000 [IU] | Freq: Every day | SUBCUTANEOUS | Status: DC
  Administered 2015-10-24: 30 [IU] via SUBCUTANEOUS
  Filled 2015-10-23: qty 0.3

## 2015-10-23 NOTE — Progress Notes (Signed)
Patient refused CPAP for the night  

## 2015-10-23 NOTE — Progress Notes (Addendum)
[facility] Progress Note  Patient: [name] ERD:[id number]   PCP: [name], MD DOB: 07/09/1961   DOA: 10/13/2015   DOS: 10/23/2015   Date of Service: the patient was seen and examined on 10/23/2015  Subjective: The patient is more awake and able to converse. Still not oriented to time but shows significant improvement in mentation Nutrition: Tolerating oral diet Activity: Out of bed to chair Last BM: 10/20/2015  Assessment and Plan: 1. Diabetes mellitus due to underlying condition with hyperosmolarity without nonketotic hyperglycemic-hyperosmolar coma [facility]) Patient presented with significantly high blood glucose with encephalopathy. She was given IV fluids and IV insulin drip. Usually consulted to [facility]ad for admission but later on was transferred to critical care for further care. Patient was transferred to hospitalist on 10/20/2015. Blood glucose continues to remain elevated. Given that the patient required 19 units of coverage yesterday I will increase her Lantus to 30 units. Continue sliding scale moderate. Hemoglobin A1c 11.9  2. Acute encephalopathy. Herpes simplex labialis. Initial CT of the head was unremarkable. Later on the patient developed labial rash suspicious for herpes simplex 1. Infectious diseases was consulted. The patient was started on IV acyclovir. Patient's encephalopathy appears to be worsening today.  CT of the head with and without contrast did not show any acute abnormality. EEG is currently pending. Neurology was consulted for further input. IR was consulted for lumbar puncture. Discontinue gabapentin  3. Acute respiratory failure with hypoxia. Most likely due to acute encephalopathy currently stable on room air.  4. chronic diastolic dysfunction. Doesn't appear to be volume overloaded after receiving IV fluids in the ICU. We will continue to closely monitor.  5. acute on chronic kidney disease. Most likely due to dehydration  due to hyperglycemia. Nephrotoxic medications were initially discontinued. Currently her renal function has been back to baseline. Need to monitor renal function after giving her IV contrast.  6. hypothyroidism. Continue Synthroid. Given that the patient's TSH has been elevated, with normal free T4, I will increase her Synthroid to 50 g from 25 due to her persistent complaint of weakness, fatigue as well as somnolence.  8. oral thrush. On Diflucan.  9. Anemia with thrombocytopenia. Patient was having anemia in the ICU with thrombocytopenia currently all her counts are stable.  10. Essential hypertension. Blood pressure stable. Continue hydralazine. Use when necessary hydralazine  11. NSVT. No further event, likely artifact. Telemetry discontinued.  12. Neuropathy. Patient was started on home dose of gabapentin but she become more somnolent and therefore it has been discontinued.  13. Obstructive sleep apnea. Patient has been refusing C-pap has not been using C-pap Will discontinue C-pap  DVT Prophylaxis: subcutaneous Heparin Nutrition: Cardiac and diabetic diet Advance goals of care discussion: Full code  Brief Summary of Hospitalization:  HPI: As per the H and P dictated on admission, "Patient presenting with altered mental state. Patient actively endorsing auditory hallucinations. Patient states that she is "hearing stuff. " Patient states that she recognizes that she is hearing voices and knows that they are not real. States that the voices are telling her to take her medicines and to go to sleep. Patient states that her home glucose ranges from 300-400. States that she has reversed before and is being treated by her primary care doctor Dr. Reche Dixon. Patient's only complaint at this time is that her sugars are high and she occasionally has to pee but states that this is normal."  Procedures: Echocardiogram Consultants: Critical care, infectious disease,  neurology Antibiotics:  Anti-infectives    Start     Dose/Rate Route Frequency Ordered Stop   10/21/15 1400  acyclovir (ZOVIRAX) 600 mg in dextrose 5 % 100 mL IVPB     600 mg 112 mL/hr over 60 Minutes Intravenous 3 times per day 10/21/15 1137     10/21/15 1300  fluconazole (DIFLUCAN) IVPB 100 mg     100 mg 50 mL/hr over 60 Minutes Intravenous Every 24 hours 10/21/15 1128 10/23/15 2359   10/20/15 1200  fluconazole (DIFLUCAN) tablet 100 mg  Status:  Discontinued     100 mg Oral Daily 10/20/15 1154 10/21/15 1128   10/19/15 1400  acyclovir (ZOVIRAX) 600 mg in dextrose 5 % 100 mL IVPB  Status:  Discontinued     600 mg 112 mL/hr over 60 Minutes Intravenous Every 12 hours 10/19/15 1236 10/21/15 1137   10/18/15 1200  cefTRIAXone (ROCEPHIN) 1 g in dextrose 5 % 50 mL IVPB  Status:  Discontinued     1 g 100 mL/hr over 30 Minutes Intravenous Every 24 hours 10/18/15 0930 10/19/15 1053   10/17/15 1000  fluconazole (DIFLUCAN) 40 MG/ML suspension 100 mg  Status:  Discontinued     100 mg Oral Daily 10/17/15 0900 10/20/15 1154   10/14/15 0100  piperacillin-tazobactam (ZOSYN) IVPB 3.375 g  Status:  Discontinued     3.375 g 12.5 mL/hr over 240 Minutes Intravenous Every 8 hours 10/14/15 0037 10/18/15 0930   10/14/15 0100  vancomycin (VANCOCIN) IVPB 750 mg/150 ml premix  Status:  Discontinued     750 mg 150 mL/hr over 60 Minutes Intravenous Every 12 hours 10/14/15 0037 10/15/15 1249      Family Communication: family was present at bedside, at the time of interview.  Opportunity was given to ask question and all questions were answered satisfactorily.   Disposition:  Barriers to safe discharge: Improvement in mental status   Intake/Output Summary (Last 24 hours) at 10/23/15 1536 Last data filed at 10/23/15 1500  Gross per 24 hour  Intake   1418 ml  Output    150 ml  Net   1268 ml   Filed Weights   10/20/15 2105 10/21/15 2036 10/23/15 0340  Weight: 91.717 kg (202 lb 3.2 oz) 92.352 kg (203 lb 9.6  oz) 90.719 kg (200 lb)    Objective: Physical Exam: Filed Vitals:   10/23/15 0046 10/23/15 0340 10/23/15 0900 10/23/15 1500  BP: 172/80 169/71 157/69 139/66  Pulse: 70 68 74 79  Temp:  98.9 F (37.2 C) 98 F (36.7 C) 97.8 F (36.6 C)  TempSrc:   Oral Oral  Resp:  22 20 20   Height:      Weight:  90.719 kg (200 lb)    SpO2:  94% 95% 90%    General: Appear in mild distress, oral Rash; Oral Mucosa moist. Cardiovascular: S1 and S2 Present, no Murmur, no JVD Respiratory: Bilateral Air entry present and Clear to Auscultation, no Crackles, no wheezes Abdomen: Bowel Sound present, Soft and no tenderness Extremities: no Pedal edema, no calf tenderness Neurology: Improvement in somnolent, continues to have confusion  Data Reviewed: CBC:  Recent Labs Lab 10/17/15 0440 10/18/15 0441 10/19/15 0500 10/21/15 0634 10/22/15 1018  WBC 5.7 7.2 10.6* 5.3 5.8  NEUTROABS 3.6 4.6 7.6 2.5 2.8  HGB 9.3* 10.4* 12.7 11.7* 11.5*  HCT 28.5* 32.2* 38.4 34.8* 34.9*  MCV 82.1 82.6 85.0 82.9 83.5  PLT 127* 188 278 286 307   Basic Metabolic Panel:  Recent Labs Lab 10/17/15 0440 10/18/15  4388 10/19/15 0500 10/20/15 0654 10/21/15 0634 10/22/15 1018  NA 138 136 144 137 140 141  K 3.4* 3.5 4.0 3.5 3.9 3.6  CL 109 105 108 102 103 102  CO2 18* 19* 21* 24 25 28   GLUCOSE 264* 239* 127* 321* 289* 235*  BUN 13 14 22* 15 12 13   CREATININE 1.28* 1.36* 1.47* 1.08* 1.16* 1.05*  CALCIUM 8.3* 8.6* 9.2 9.0 9.0 9.4  MG 1.9 1.8 1.9  --  2.0 1.9  PHOS 4.1 3.9 6.0*  --   --   --    Liver Function Tests:  Recent Labs Lab 10/19/15 0500 10/21/15 0634 10/22/15 1018  AST 33 21 27  ALT 17 17 21   ALKPHOS 130* 112 102  BILITOT 0.7 0.4 0.5  PROT 6.8 5.9* 6.7  ALBUMIN 2.4* 2.2* 2.3*   No results for input(s): LIPASE, AMYLASE in the last 168 hours.  Recent Labs Lab 10/21/15 0634  AMMONIA 36*    Cardiac Enzymes:  Recent Labs Lab 10/22/15 1018 10/22/15 1627 10/22/15 2223  TROPONINI 0.08*  0.10* 0.07*    BNP (last 3 results) No results for input(s): BNP in the last 8760 hours.  CBG:  Recent Labs Lab 10/22/15 1057 10/22/15 1646 10/22/15 2138 10/23/15 0745 10/23/15 1146  GLUCAP 205* 313* 272* 243* 294*    Recent Results (from the past 240 hour(s))  MRSA PCR Screening     Status: None   Collection Time: 10/13/15  7:44 PM  Result Value Ref Range Status   MRSA by PCR NEGATIVE NEGATIVE Final    Comment:        The GeneXpert MRSA Assay (FDA approved for NASAL specimens only), is one component of a comprehensive MRSA colonization surveillance program. It is not intended to diagnose MRSA infection nor to guide or monitor treatment for MRSA infections.   Urine culture     Status: None   Collection Time: 10/13/15  9:00 PM  Result Value Ref Range Status   Specimen Description URINE, CATHETERIZED  Final   Special Requests NONE  Final   Culture NO GROWTH 1 DAY  Final   Report Status 10/15/2015 FINAL  Final  C difficile quick scan w PCR reflex     Status: Abnormal   Collection Time: 10/19/15  7:52 AM  Result Value Ref Range Status   C Diff antigen POSITIVE (A) NEGATIVE Final   C Diff toxin NEGATIVE NEGATIVE Final   C Diff interpretation   Final    C. difficile present, but toxin not detected. This indicates colonization. In most cases, this does not require treatment. If patient has signs and symptoms consistent with colitis, consider treatment. Requires ENTERIC precautions.  CSF culture     Status: None (Preliminary result)   Collection Time: 10/22/15  9:15 AM  Result Value Ref Range Status   Specimen Description CSF  Final   Special Requests NONE  Final   Gram Stain   Final    CYTOSPIN SMEAR WBC PRESENT, PREDOMINANTLY MONONUCLEAR NO ORGANISMS SEEN    Culture NO GROWTH < 24 HOURS  Final   Report Status PENDING  Incomplete     Studies: No results found.   Scheduled Meds: . acyclovir  600 mg Intravenous 3 times per day  . antiseptic oral rinse  7 mL  Mouth Rinse BID  . aspirin  81 mg Oral Daily  . atorvastatin  80 mg Oral Daily  . carvedilol  3.125 mg Oral BID WC  . chlorhexidine  15 mL  Mouth Rinse BID  . fluconazole (DIFLUCAN) IV  100 mg Intravenous Q24H  . hydrALAZINE  25 mg Oral 3 times per day  . insulin aspart  0-15 Units Subcutaneous TID WC  . insulin aspart  0-5 Units Subcutaneous QHS  . insulin glargine  24 Units Subcutaneous Daily  . levothyroxine  50 mcg Oral QAC breakfast  . sodium chloride flush  3 mL Intravenous Q12H   Continuous Infusions:  PRN Meds: acetaminophen, albuterol, hydrALAZINE, magic mouthwash, ondansetron (ZOFRAN) IV  Time spent: 30 minutes  Author: Lynden Oxford, MD [facility] Pager: [phone] 10/23/2015 3:36 PM  If 7PM-7AM, please contact night-coverage at [email or web address], password [facility]

## 2015-10-24 DIAGNOSIS — R4182 Altered mental status, unspecified: Secondary | ICD-10-CM | POA: Insufficient documentation

## 2015-10-24 LAB — GLUCOSE, CAPILLARY
GLUCOSE-CAPILLARY: 360 mg/dL — AB (ref 65–99)
Glucose-Capillary: 264 mg/dL — ABNORMAL HIGH (ref 65–99)
Glucose-Capillary: 271 mg/dL — ABNORMAL HIGH (ref 65–99)
Glucose-Capillary: 339 mg/dL — ABNORMAL HIGH (ref 65–99)

## 2015-10-24 LAB — HERPES SIMPLEX VIRUS(HSV) DNA BY PCR
HSV 1 DNA: NEGATIVE
HSV 2 DNA: NEGATIVE

## 2015-10-24 MED ORDER — VALACYCLOVIR HCL 500 MG PO TABS
1000.0000 mg | ORAL_TABLET | Freq: Two times a day (BID) | ORAL | Status: DC
Start: 2015-10-24 — End: 2015-10-25
  Administered 2015-10-24 – 2015-10-25 (×3): 1000 mg via ORAL
  Filled 2015-10-24 (×3): qty 2

## 2015-10-24 MED ORDER — FLUCONAZOLE 100 MG PO TABS
100.0000 mg | ORAL_TABLET | ORAL | Status: DC
  Administered 2015-10-24: 100 mg via ORAL
  Filled 2015-10-24: qty 1

## 2015-10-24 MED ORDER — INSULIN GLARGINE 100 UNIT/ML ~~LOC~~ SOLN
10.0000 [IU] | Freq: Once | SUBCUTANEOUS | Status: AC
  Administered 2015-10-24: 10 [IU] via SUBCUTANEOUS
  Filled 2015-10-24: qty 0.1

## 2015-10-24 MED ORDER — INSULIN GLARGINE 100 UNIT/ML ~~LOC~~ SOLN
40.0000 [IU] | Freq: Every day | SUBCUTANEOUS | Status: DC
  Administered 2015-10-25: 40 [IU] via SUBCUTANEOUS
  Filled 2015-10-24: qty 0.4

## 2015-10-24 NOTE — Procedures (Signed)
  Current facility-administered medications:  .  acetaminophen (TYLENOL) suppository 650 mg, 650 mg, Rectal, Q6H PRN, Karl Ito, MD .  albuterol (PROVENTIL) (2.5 MG/3ML) 0.083% nebulizer solution 2.5 mg, 2.5 mg, Nebulization, Q3H PRN, Rahul P Desai, PA-C .  antiseptic oral rinse (CPC / CETYLPYRIDINIUM CHLORIDE 0.05%) solution 7 mL, 7 mL, Mouth Rinse, BID, Nelda Bucks, MD, 7 mL at 10/24/15 2209 .  aspirin chewable tablet 81 mg, 81 mg, Oral, Daily, Rolly Salter, MD, 81 mg at 10/24/15 1147 .  atorvastatin (LIPITOR) tablet 80 mg, 80 mg, Oral, Daily, Rolly Salter, MD, 80 mg at 10/24/15 1147 .  carvedilol (COREG) tablet 3.125 mg, 3.125 mg, Oral, BID WC, Rolly Salter, MD, 3.125 mg at 10/24/15 1802 .  chlorhexidine (PERIDEX) 0.12 % solution 15 mL, 15 mL, Mouth Rinse, BID, Nelda Bucks, MD, 15 mL at 10/24/15 2208 .  fluconazole (DIFLUCAN) tablet 100 mg, 100 mg, Oral, Q24H, Rolly Salter, MD, 100 mg at 10/24/15 1802 .  hydrALAZINE (APRESOLINE) injection 10 mg, 10 mg, Intravenous, Q6H PRN, Rolan Lipa, NP .  hydrALAZINE (APRESOLINE) tablet 25 mg, 25 mg, Oral, 3 times per day, Nelda Bucks, MD, 25 mg at 10/24/15 2209 .  insulin aspart (novoLOG) injection 0-15 Units, 0-15 Units, Subcutaneous, TID WC, Rolly Salter, MD, 15 Units at 10/24/15 1803 .  insulin aspart (novoLOG) injection 0-5 Units, 0-5 Units, Subcutaneous, QHS, Rolly Salter, MD, 3 Units at 10/23/15 2230 .  [START ON 10/25/2015] insulin glargine (LANTUS) injection 40 Units, 40 Units, Subcutaneous, Daily, Rolly Salter, MD .  levothyroxine (SYNTHROID, LEVOTHROID) tablet 50 mcg, 50 mcg, Oral, QAC breakfast, Rolly Salter, MD, 50 mcg at 10/24/15 [phone] .  magic mouthwash, 5 mL, Oral, TID PRN, Nelda Bucks, MD .  ondansetron [facility]) injection 4 mg, 4 mg, Intravenous, Q6H PRN, Rolly Salter, MD, 4 mg at 10/20/15 2219 .  sodium chloride flush (NS) 0.9 % injection 3 mL, 3 mL, Intravenous, Q12H, Ozella Rocks, MD, 3 mL at 10/24/15 1148 .  valACYclovir (VALTREX) tablet 1,000 mg, 1,000 mg, Oral, BID, Belinda Fisher Stone, RPH, 1,000 mg at 10/24/15 2208  Introduction:  This is a 19 channel routine scalp EEG performed at the bedside with bipolar and monopolar montages arranged in accordance to the international 10/20 system of electrode placement. One channel was dedicated to EKG recording.    Findings:  The best background activity in wakefulness reaches up to 9 Hz alpha . No definite evidence of abnormal epileptiform discharges or electrographic seizures were noted during this recording.   Impression:   Unremarkable awake and drowsy routine inpatient EEG. Clinical correlation is recommended .

## 2015-10-24 NOTE — Progress Notes (Signed)
[facility] Progress Note  Patient: [name] XHB:[id number]   PCP: [name], MD DOB: May 26, 1961   DOA: 10/13/2015   DOS: 10/24/2015   Date of Service: the patient was seen and examined on 10/24/2015  Subjective: Patient continues to improve. Does not have anymore confusion. No other complaints. The lesion are also improving on the lips Nutrition: Tolerating oral diet Activity: Out of bed to chair Last BM: 10/24/2015  Assessment and Plan: 1. Diabetes mellitus due to underlying condition with hyperosmolarity without nonketotic hyperglycemic-hyperosmolar coma [facility]) Patient presented with significantly high blood glucose with encephalopathy. She was given IV fluids and IV insulin drip. Usually consulted to [facility] for admission but later on was transferred to critical care for further care. Patient was transferred to hospitalist on 10/20/2015. Blood glucose continues to remain elevated. Given that the patient required 27 units of coverage yesterday I will increase her Lantus to 40 units. Continue sliding scale moderate. Hemoglobin A1c 11.9  2. Acute encephalopathy. Herpes simplex labialis. Initial CT of the head was unremarkable. Later on the patient developed labial rash suspicious for herpes simplex 1. Infectious diseases was consulted. The patient was started on IV acyclovir. Patient's encephalopathy appears to be worsening today.  CT of the head with and without contrast did not show any acute abnormality. EEG is negative. Neurology was consulted for further input. IR was consulted for lumbar puncture. Lumbar puncture does not show any evidence of acute infection, HSV PCR is negative. We will reduce the acyclovir to Valtrex oral Discontinue gabapentin  3. Acute respiratory failure with hypoxia. Most likely due to acute encephalopathy currently stable on room air.  4. chronic diastolic dysfunction. Doesn't appear to be volume overloaded after receiving IV  fluids in the ICU. We will continue to closely monitor.  5. acute on chronic kidney disease. Most likely due to dehydration due to hyperglycemia. Nephrotoxic medications were initially discontinued. Currently her renal function has been back to baseline. Need to monitor renal function after giving her IV contrast.  6. hypothyroidism. Continue Synthroid. Given that the patient's TSH has been elevated, with normal free T4, I will increase her Synthroid to 50 g from 25 due to her persistent complaint of weakness, fatigue as well as somnolence.  8. oral thrush. On Diflucan. Finish total 14 day treatment  9. Anemia with thrombocytopenia. Patient was having anemia in the ICU with thrombocytopenia currently all her counts are stable.  10. Essential hypertension. Blood pressure stable. Continue hydralazine. Use when necessary hydralazine  11. NSVT. No further event, likely artifact. Telemetry discontinued.  12. Neuropathy. Patient was started on home dose of gabapentin but she become more somnolent and therefore it has been discontinued.  13. Obstructive sleep apnea. Patient has been refusing C-pap has not been using C-pap Will discontinue C-pap  DVT Prophylaxis: subcutaneous Heparin Nutrition: Cardiac and diabetic diet Advance goals of care discussion: Full code  Brief Summary of Hospitalization:  HPI: As per the H and P dictated on admission, "Patient presenting with altered mental state. Patient actively endorsing auditory hallucinations. Patient states that she is "hearing stuff. " Patient states that she recognizes that she is hearing voices and knows that they are not real. States that the voices are telling her to take her medicines and to go to sleep. Patient states that her home glucose ranges from 300-400. States that she has reversed before and is being treated by her primary care doctor Dr. Reche Dixon. Patient's only complaint at this time is that her  sugars are high and she  occasionally has to pee but states that this is normal."  Procedures: Echocardiogram Consultants: Critical care, infectious disease, neurology Antibiotics: Anti-infectives    Start     Dose/Rate Route Frequency Ordered Stop   10/24/15 1300  valACYclovir (VALTREX) tablet 1,000 mg     1,000 mg Oral 2 times daily 10/24/15 0955     10/23/15 1545  fluconazole (DIFLUCAN) IVPB 100 mg     100 mg 50 mL/hr over 60 Minutes Intravenous Every 24 hours 10/23/15 1537 10/25/15 1544   10/21/15 1400  acyclovir (ZOVIRAX) 600 mg in dextrose 5 % 100 mL IVPB  Status:  Discontinued     600 mg 112 mL/hr over 60 Minutes Intravenous 3 times per day 10/21/15 1137 10/24/15 0928   10/21/15 1300  fluconazole (DIFLUCAN) IVPB 100 mg  Status:  Discontinued     100 mg 50 mL/hr over 60 Minutes Intravenous Every 24 hours 10/21/15 1128 10/23/15 1537   10/20/15 1200  fluconazole (DIFLUCAN) tablet 100 mg  Status:  Discontinued     100 mg Oral Daily 10/20/15 1154 10/21/15 1128   10/19/15 1400  acyclovir (ZOVIRAX) 600 mg in dextrose 5 % 100 mL IVPB  Status:  Discontinued     600 mg 112 mL/hr over 60 Minutes Intravenous Every 12 hours 10/19/15 1236 10/21/15 1137   10/18/15 1200  cefTRIAXone (ROCEPHIN) 1 g in dextrose 5 % 50 mL IVPB  Status:  Discontinued     1 g 100 mL/hr over 30 Minutes Intravenous Every 24 hours 10/18/15 0930 10/19/15 1053   10/17/15 1000  fluconazole (DIFLUCAN) 40 MG/ML suspension 100 mg  Status:  Discontinued     100 mg Oral Daily 10/17/15 0900 10/20/15 1154   10/14/15 0100  piperacillin-tazobactam (ZOSYN) IVPB 3.375 g  Status:  Discontinued     3.375 g 12.5 mL/hr over 240 Minutes Intravenous Every 8 hours 10/14/15 0037 10/18/15 0930   10/14/15 0100  vancomycin (VANCOCIN) IVPB 750 mg/150 ml premix  Status:  Discontinued     750 mg 150 mL/hr over 60 Minutes Intravenous Every 12 hours 10/14/15 0037 10/15/15 1249      Family Communication: family was present at bedside, at the time of interview.    Opportunity was given to ask question and all questions were answered satisfactorily.   Disposition:  Barriers to safe discharge: Improvement in mental status   Intake/Output Summary (Last 24 hours) at 10/24/15 1742 Last data filed at 10/24/15 0608  Gross per 24 hour  Intake    224 ml  Output      0 ml  Net    224 ml   Filed Weights   10/21/15 2036 10/23/15 0340 10/24/15 0500  Weight: 92.352 kg (203 lb 9.6 oz) 90.719 kg (200 lb) 90.717 kg (199 lb 15.9 oz)    Objective: Physical Exam: Filed Vitals:   10/23/15 0900 10/23/15 1500 10/23/15 2256 10/24/15 0500  BP: 157/69 139/66 177/67   Pulse: 74 79 76   Temp: 98 F (36.7 C) 97.8 F (36.6 C) 98.6 F (37 C)   TempSrc: Oral Oral Oral   Resp: 20 20 19    Height:      Weight:    90.717 kg (199 lb 15.9 oz)  SpO2: 95% 90% 96%     General: Appear in no distress, improving lesions on the lips; Oral Mucosa moist. Cardiovascular: S1 and S2 Present, no Murmur, no JVD Respiratory: Bilateral Air entry present and Clear to Auscultation, no Crackles,  no wheezes Abdomen: Bowel Sound present, Soft and no tenderness Extremities: no Pedal edema, no calf tenderness Neurology: No acute abnormality  Data Reviewed: CBC:  Recent Labs Lab 10/18/15 0441 10/19/15 0500 10/21/15 0634 10/22/15 1018  WBC 7.2 10.6* 5.3 5.8  NEUTROABS 4.6 7.6 2.5 2.8  HGB 10.4* 12.7 11.7* 11.5*  HCT 32.2* 38.4 34.8* 34.9*  MCV 82.6 85.0 82.9 83.5  PLT 188 278 286 307   Basic Metabolic Panel:  Recent Labs Lab 10/18/15 0441 10/19/15 0500 10/20/15 0654 10/21/15 0634 10/22/15 1018  NA 136 144 137 140 141  K 3.5 4.0 3.5 3.9 3.6  CL 105 108 102 103 102  CO2 19* 21* 24 25 28   GLUCOSE 239* 127* 321* 289* 235*  BUN 14 22* 15 12 13   CREATININE 1.36* 1.47* 1.08* 1.16* 1.05*  CALCIUM 8.6* 9.2 9.0 9.0 9.4  MG 1.8 1.9  --  2.0 1.9  PHOS 3.9 6.0*  --   --   --    Liver Function Tests:  Recent Labs Lab 10/19/15 0500 10/21/15 0634 10/22/15 1018  AST 33  21 27  ALT 17 17 21   ALKPHOS 130* 112 102  BILITOT 0.7 0.4 0.5  PROT 6.8 5.9* 6.7  ALBUMIN 2.4* 2.2* 2.3*   No results for input(s): LIPASE, AMYLASE in the last 168 hours.  Recent Labs Lab 10/21/15 0634  AMMONIA 36*    Cardiac Enzymes:  Recent Labs Lab 10/22/15 1018 10/22/15 1627 10/22/15 2223  TROPONINI 0.08* 0.10* 0.07*    BNP (last 3 results) No results for input(s): BNP in the last 8760 hours.  CBG:  Recent Labs Lab 10/23/15 1146 10/23/15 1618 10/23/15 2303 10/24/15 0746 10/24/15 1221  GLUCAP 294* 322* 300* 264* 271*    Recent Results (from the past 240 hour(s))  C difficile quick scan w PCR reflex     Status: Abnormal   Collection Time: 10/19/15  7:52 AM  Result Value Ref Range Status   C Diff antigen POSITIVE (A) NEGATIVE Final   C Diff toxin NEGATIVE NEGATIVE Final   C Diff interpretation   Final    C. difficile present, but toxin not detected. This indicates colonization. In most cases, this does not require treatment. If patient has signs and symptoms consistent with colitis, consider treatment. Requires ENTERIC precautions.  CSF culture     Status: None (Preliminary result)   Collection Time: 10/22/15  9:15 AM  Result Value Ref Range Status   Specimen Description CSF  Final   Special Requests NONE  Final   Gram Stain   Final    CYTOSPIN SMEAR WBC PRESENT, PREDOMINANTLY MONONUCLEAR NO ORGANISMS SEEN    Culture NO GROWTH 2 DAYS  Final   Report Status PENDING  Incomplete     Studies: No results found.   Scheduled Meds: . antiseptic oral rinse  7 mL Mouth Rinse BID  . aspirin  81 mg Oral Daily  . atorvastatin  80 mg Oral Daily  . carvedilol  3.125 mg Oral BID WC  . chlorhexidine  15 mL Mouth Rinse BID  . fluconazole (DIFLUCAN) IV  100 mg Intravenous Q24H  . hydrALAZINE  25 mg Oral 3 times per day  . insulin aspart  0-15 Units Subcutaneous TID WC  . insulin aspart  0-5 Units Subcutaneous QHS  . [START ON 10/25/2015] insulin glargine  40  Units Subcutaneous Daily  . levothyroxine  50 mcg Oral QAC breakfast  . sodium chloride flush  3 mL Intravenous Q12H  .  valACYclovir  1,000 mg Oral BID   Continuous Infusions:  PRN Meds: acetaminophen, albuterol, hydrALAZINE, magic mouthwash, ondansetron (ZOFRAN) IV  Time spent: 30 minutes  Author: Lynden Oxford, MD [facility] Pager: [phone] 10/24/2015 5:42 PM  If 7PM-7AM, please contact night-coverage at [email or web address], password [facility]

## 2015-10-24 NOTE — Progress Notes (Signed)
Interval History:                                                                                                                      [name] is an 55 y.o. female patient  with altered mental status.  Had extensive neurodiagnostic evaluation including lumbar puncture for CSF analysis. CSF studies have all been negative. HSV PCR is negative and CSF.  EEG done on 10/21/2014 was unremarkable.  CT of the head done on 10/21/2015 was unremarkable,  Showed no acute pathology.  Clinically she is been improving gradually day by day, and reports that her mental status level of functioning is nearly at her baseline now. No new neurological symptoms.   Past Medical History: Past Medical History  Diagnosis Date  . Diabetic neuropathy (HCC)   . Cellulitis   . Thyroid disease   . Hyperlipidemia   . Neuromuscular disorder (HCC)   . Concussion as a teenager    slight  . Diarrhea     pt takes Reglan tid  . Hypothyroidism     takes Synthroid daily  . Depression     takes CYmbalta daily  . MRSA (methicillin resistant staph aureus) culture positive     hx of 2012  . Staph infection 2007  . Complication of anesthesia 01/2013    "didn't know where I was; who I was; talking out the top of my head" (May 12, 2013)  . Hypertension   . Peripheral vascular disease (HCC)   . CHF (congestive heart failure) (HCC)   . Pneumonia 1980's    "hospitalized w/it" (05-12-13)  . Chronic bronchitis (HCC)   . Orthopnea     "just one; yesterday morning" (05/12/2013)  . Type II diabetes mellitus (HCC)     Novolog,Levemir,and Lovaza daily  . GERD (gastroesophageal reflux disease)   . Daily headache   . Rheumatoid arthritis (HCC)     "both knees" (2013-05-12)  . Anxiety   . Kidney stone     "passed on before" (05/12/13)  . Non-healing wound of lower extremity     lt foot  . OSA on CPAP     and oxygen  . COPD (chronic obstructive pulmonary disease) (HCC)     wear oxygen at home  . Symptomatic bradycardia 03/04/2014     Past Surgical History  Procedure Laterality Date  . Amputation    . Leg amputation below knee Right 2012  . Toe amputation Left ?2011     only 2 toes remaning.   . Tubal ligation  1990's  . Foot surgery Right 2012    "put pins in one year; amputated toes another OR" (05-12-13)  . Wrist surgery Right 1980's    "pinched nerve" (May 12, 2013)  . Dilation and curettage of uterus  1980's  . Refractive surgery Bilateral 2014  . Incision and drainage abscess N/A 01/24/2013    Procedure: INCISION AND DRAINAGE ABSCESS;  Surgeon: Clovis Pu. Cornett, MD;  Location: [facility] OR;  Service: General;  Laterality: N/A;  .  Inguinal hernia repair N/A 01/28/2013    Procedure: I&D Right Groin Wound;  Surgeon: Shelly Rubenstein, MD;  Location: [facility] OR;  Service: General;  Laterality: N/A;  . Ganglion cyst excision Left 1990's    "wrist" (04/21/2013)  . Bladder surgery  1970's    "stretched the mouth of my bladder" (04/21/2013)  . I&d extremity Left 08/25/2013    Procedure: IRRIGATION AND DEBRIDEMENT EXTREMITY;  Surgeon: Axel Filler, MD;  Location: [facility] OR;  Service: General;  Laterality: Left;  . Pacemaker insertion  03/05/14    Biotronik dual chamber pacemaker implanted by Dr Graciela Husbands for symptomatic bradycardia  . Permanent pacemaker insertion N/A 03/05/2014    Procedure: PERMANENT PACEMAKER INSERTION;  Surgeon: Duke Salvia, MD;  Location: [facility] CATH LAB;  Service: Cardiovascular;  Laterality: N/A;  . Stump revision Right 12/11/2014    Procedure: Right Below Knee Amputation Revision;  Surgeon: Nadara Mustard, MD;  Location: [facility] OR;  Service: Orthopedics;  Laterality: Right;    Family History: Family History  Problem Relation Age of Onset  . Hypertension Mother   . Hyperlipidemia Mother   . Hypertrophic cardiomyopathy Mother     has pacer  . Hypertension Brother   . Hyperlipidemia Brother   . Cancer Maternal Aunt   . Diabetes Paternal Aunt   . Diabetes Paternal Uncle   . Diabetes Paternal Grandmother   .  Anesthesia problems Neg Hx   . Hypotension Neg Hx   . Malignant hyperthermia Neg Hx   . Pseudochol deficiency Neg Hx   . Alzheimer's disease Father     Social History:   reports that she has been smoking Cigarettes.  She has a 33 pack-year smoking history. She has never used smokeless tobacco. She reports that she does not drink alcohol or use illicit drugs.  Allergies:  Allergies  Allergen Reactions  . Erythromycin Diarrhea  . Iodine-131 Nausea And Vomiting  . Gadolinium Nausea And Vomiting     Code: VOM, Desc: Pt began vomiting immed post infusion of multihance, Onset Date: [id number]   . Ivp Dye [Iodinated Diagnostic Agents] Nausea And Vomiting    Nausea and vomiting   . Metformin Diarrhea     diarrhea  . Penicillins Hives    Has patient had a PCN reaction causing immediate rash, facial/tongue/throat swelling, SOB or lightheadedness with hypotension: No Has patient had a PCN reaction causing severe rash involving mucus membranes or skin necrosis: No Has patient had a PCN reaction that required hospitalization No Has patient had a PCN reaction occurring within the last 10 years: No If all of the above answers are "NO", then may proceed with Cephalosporin use.      Medications:                                                                                                                         Current facility-administered medications:  .  acetaminophen (TYLENOL) suppository 650 mg, 650 mg, Rectal, Q6H PRN, Karl Ito,  MD .  albuterol (PROVENTIL) (2.5 MG/3ML) 0.083% nebulizer solution 2.5 mg, 2.5 mg, Nebulization, Q3H PRN, Rahul P Desai, PA-C .  antiseptic oral rinse (CPC / CETYLPYRIDINIUM CHLORIDE 0.05%) solution 7 mL, 7 mL, Mouth Rinse, BID, Nelda Bucks, MD, 7 mL at 10/23/15 2200 .  aspirin chewable tablet 81 mg, 81 mg, Oral, Daily, Rolly Salter, MD, 81 mg at 10/24/15 1147 .  atorvastatin (LIPITOR) tablet 80 mg, 80 mg, Oral, Daily, Rolly Salter, MD, 80 mg at  10/24/15 1147 .  carvedilol (COREG) tablet 3.125 mg, 3.125 mg, Oral, BID WC, Rolly Salter, MD, 3.125 mg at 10/24/15 5859 .  chlorhexidine (PERIDEX) 0.12 % solution 15 mL, 15 mL, Mouth Rinse, BID, Nelda Bucks, MD, 15 mL at 10/24/15 1148 .  fluconazole (DIFLUCAN) IVPB 100 mg, 100 mg, Intravenous, Q24H, Rolly Salter, MD, 100 mg at 10/23/15 1801 .  hydrALAZINE (APRESOLINE) injection 10 mg, 10 mg, Intravenous, Q6H PRN, Rolan Lipa, NP .  hydrALAZINE (APRESOLINE) tablet 25 mg, 25 mg, Oral, 3 times per day, Nelda Bucks, MD, 25 mg at 10/24/15 1540 .  insulin aspart (novoLOG) injection 0-15 Units, 0-15 Units, Subcutaneous, TID WC, Rolly Salter, MD, 8 Units at 10/24/15 1241 .  insulin aspart (novoLOG) injection 0-5 Units, 0-5 Units, Subcutaneous, QHS, Rolly Salter, MD, 3 Units at 10/23/15 2230 .  [START ON 10/25/2015] insulin glargine (LANTUS) injection 40 Units, 40 Units, Subcutaneous, Daily, Rolly Salter, MD .  levothyroxine (SYNTHROID, LEVOTHROID) tablet 50 mcg, 50 mcg, Oral, QAC breakfast, Rolly Salter, MD, 50 mcg at 10/24/15 [phone] .  magic mouthwash, 5 mL, Oral, TID PRN, Nelda Bucks, MD .  ondansetron [facility]) injection 4 mg, 4 mg, Intravenous, Q6H PRN, Rolly Salter, MD, 4 mg at 10/20/15 2219 .  sodium chloride flush (NS) 0.9 % injection 3 mL, 3 mL, Intravenous, Q12H, Ozella Rocks, MD, 3 mL at 10/24/15 1148 .  valACYclovir (VALTREX) tablet 1,000 mg, 1,000 mg, Oral, BID, Belinda Fisher Stone, RPH, 1,000 mg at 10/24/15 1240   Neurologic Examination:                                                                                                      Blood pressure 177/67, pulse 76, temperature 98.6 F (37 C), temperature source Oral, resp. rate 19, height 5\' 5"  (1.651 m), weight 90.717 kg (199 lb 15.9 oz), SpO2 96 %.  Evaluation of higher integrative functions including: Level of alertness: Alert,  Oriented to time, place and person Attention span and  concentration  - intact   Speech: fluent, no evidence of dysarthria or aphasia noted.  Test the following cranial nerves: 2-12 grossly intact Motor examination: Normal tone, bulk, full 5/5 motor strength in all 4 extremities Examination of sensation : Normal and symmetric sensation to pinprick in all 4 extremities and on face Examination of deep tendon reflexes: 2+, normal and symmetric in all extremities, normal plantars bilaterally Test coordination: Normal finger nose testing, with no evidence of limb appendicular ataxia or abnormal involuntary movements or tremors  noted.     Lab Results: Basic Metabolic Panel:  Recent Labs Lab 10/18/15 0441 10/19/15 0500 10/20/15 0654 10/21/15 0634 10/22/15 1018  NA 136 144 137 140 141  K 3.5 4.0 3.5 3.9 3.6  CL 105 108 102 103 102  CO2 19* 21* 24 25 28   GLUCOSE 239* 127* 321* 289* 235*  BUN 14 22* 15 12 13   CREATININE 1.36* 1.47* 1.08* 1.16* 1.05*  CALCIUM 8.6* 9.2 9.0 9.0 9.4  MG 1.8 1.9  --  2.0 1.9  PHOS 3.9 6.0*  --   --   --     Liver Function Tests:  Recent Labs Lab 10/19/15 0500 10/21/15 0634 10/22/15 1018  AST 33 21 27  ALT 17 17 21   ALKPHOS 130* 112 102  BILITOT 0.7 0.4 0.5  PROT 6.8 5.9* 6.7  ALBUMIN 2.4* 2.2* 2.3*   No results for input(s): LIPASE, AMYLASE in the last 168 hours.  Recent Labs Lab 10/21/15 0634  AMMONIA 36*    CBC:  Recent Labs Lab 10/18/15 0441 10/19/15 0500 10/21/15 0634 10/22/15 1018  WBC 7.2 10.6* 5.3 5.8  NEUTROABS 4.6 7.6 2.5 2.8  HGB 10.4* 12.7 11.7* 11.5*  HCT 32.2* 38.4 34.8* 34.9*  MCV 82.6 85.0 82.9 83.5  PLT 188 278 286 307    Cardiac Enzymes:  Recent Labs Lab 10/22/15 1018 10/22/15 1627 10/22/15 2223  TROPONINI 0.08* 0.10* 0.07*    Lipid Panel: No results for input(s): CHOL, TRIG, HDL, CHOLHDL, VLDL, LDLCALC in the last 168 hours.  CBG:  Recent Labs Lab 10/23/15 1146 10/23/15 1618 10/23/15 2303 10/24/15 0746 10/24/15 1221  GLUCAP 294* 322* 300* 264*  271*    Microbiology: Results for orders placed or performed during the hospital encounter of 10/13/15  Culture, blood (routine x 2)     Status: None   Collection Time: 10/13/15  9:45 AM  Result Value Ref Range Status   Specimen Description BLOOD LEFT HAND  Final   Special Requests BOTTLES DRAWN AEROBIC AND ANAEROBIC 12/24/15  Final   Culture NO GROWTH 5 DAYS  Final   Report Status 10/18/2015 FINAL  Final  Culture, blood (routine x 2)     Status: None   Collection Time: 10/13/15  9:50 AM  Result Value Ref Range Status   Specimen Description BLOOD LEFT FOREARM  Final   Special Requests BOTTLES DRAWN AEROBIC AND ANAEROBIC  Final   Culture NO GROWTH 5 DAYS  Final   Report Status 10/18/2015 FINAL  Final  MRSA PCR Screening     Status: None   Collection Time: 10/13/15  7:44 PM  Result Value Ref Range Status   MRSA by PCR NEGATIVE NEGATIVE Final    Comment:        The GeneXpert MRSA Assay (FDA approved for NASAL specimens only), is one component of a comprehensive MRSA colonization surveillance program. It is not intended to diagnose MRSA infection nor to guide or monitor treatment for MRSA infections.   Urine culture     Status: None   Collection Time: 10/13/15  9:00 PM  Result Value Ref Range Status   Specimen Description URINE, CATHETERIZED  Final   Special Requests NONE  Final   Culture NO GROWTH 1 DAY  Final   Report Status 10/15/2015 FINAL  Final  C difficile quick scan w PCR reflex     Status: Abnormal   Collection Time: 10/19/15  7:52 AM  Result Value Ref Range Status   C Diff antigen POSITIVE (  A) NEGATIVE Final   C Diff toxin NEGATIVE NEGATIVE Final   C Diff interpretation   Final    C. difficile present, but toxin not detected. This indicates colonization. In most cases, this does not require treatment. If patient has signs and symptoms consistent with colitis, consider treatment. Requires ENTERIC precautions.  CSF culture     Status: None (Preliminary result)    Collection Time: 10/22/15  9:15 AM  Result Value Ref Range Status   Specimen Description CSF  Final   Special Requests NONE  Final   Gram Stain   Final    CYTOSPIN SMEAR WBC PRESENT, PREDOMINANTLY MONONUCLEAR NO ORGANISMS SEEN    Culture NO GROWTH 2 DAYS  Final   Report Status PENDING  Incomplete    Imaging: Ct Head W Wo Contrast  10/21/2015  CLINICAL DATA:  Follow-up acute encephalopathy. History of diabetes, rheumatoid arthritis, concussion, hyperlipidemia, sepsis. EXAM: CT HEAD WITHOUT AND WITH CONTRAST TECHNIQUE: Contiguous axial images were obtained from the base of the skull through the vertex without and with intravenous contrast CONTRAST:  75mL OMNIPAQUE IOHEXOL 300 MG/ML  SOLN COMPARISON:  CT head October 13, 2015 FINDINGS: Mild ventriculomegaly on the basis of global parenchymal brain volume loss. No intraparenchymal hemorrhage, mass effect nor midline shift. No acute large vascular territory infarcts. Mild white matter changes compatible with chronic small vessel ischemic disease. No abnormal intracranial enhancement. No abnormal extra-axial fluid collections. Basal cisterns are patent. No skull fracture. The included ocular globes and orbital contents are non-suspicious. Status post bilateral ocular lens implants. The mastoid aircells and included paranasal sinuses are well-aerated. Patient is edentulous. IMPRESSION: No acute intracranial process. Mild global parenchymal brain volume loss for age. Mild chronic small vessel ischemic disease. Electronically Signed   By: Awilda Metro M.D.   On: 10/21/2015 01:41   Dg Fluoro Guide Lumbar Puncture  10/22/2015  CLINICAL DATA:  Acute encephalopathy. EXAM: DIAGNOSTIC LUMBAR PUNCTURE UNDER FLUOROSCOPIC GUIDANCE FLUOROSCOPY TIME:  If the device does not provide the exposure index: Fluoroscopy Time (in minutes and seconds):  26 seconds Number of Acquired Images:  2 PROCEDURE: Informed consent was obtained from the patient's brother because the  patient is unable to give consent due to the encephalopathy. The risks of the procedure were discussed and informed consent was obtained over the telephone. With the patient prone, the lower back was prepped with Betadine. 1% Lidocaine was used for local anesthesia. Lumbar puncture was performed at the L4-L5 level using a 20 gauge needle. Only a trace amount of CSF was noted in the needle hub and this was probably related to the length of the needle and the patient's body habitus. The needle was repositioned a few times. Eventually, the table was elevated in order to help CSF drainage. Initially, a small amount of blood tinged CSF was noted but the CSF quickly cleared. 10.5 mL of CSF was removed. Opening pressure was approximately 13 cm. The patient tolerated the procedure well and there were no apparent complications. IMPRESSION: Successful fluoroscopic guided lumbar puncture. Electronically Signed   By: Richarda Overlie M.D.   On: 10/22/2015 10:05    Assessment and plan:   BEDIE DOMINEY is an 55 y.o. female patient  with improved mental status,  likely resolved  metabolic encephalopathy.   Negative neurodiagnostic testing with negative CSF studies, EEG and CT of the head as described above. nonfocal neurologic examination.  At this time, no further neurological recommendations. We'll sign off. Please call for any further questions.

## 2015-10-24 NOTE — Progress Notes (Signed)
Pharmacy Antibiotic Note  [name] is a 55 y.o. 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  Pharmacy has been consulted for valtrex dosing. Patient was initially on IV acyclovir at CNS dosing but is to now be switched to valtrex since acute encephalopathy is not likely due to herpes simplex.  Plan: Valtrex 1 g PO q12h  Monitor renal function and clinical improvement  Height: 5\' 5"  (165.1 cm) Weight: 199 lb 15.9 oz (90.717 kg) IBW/kg (Calculated) : 57  Temp (24hrs), Avg:98.2 F (36.8 C), Min:97.8 F (36.6 C), Max:98.6 F (37 C)   Recent Labs Lab 10/18/15 0441 10/19/15 0500 10/20/15 0654 10/21/15 0634 10/22/15 1018  WBC 7.2 10.6*  --  5.3 5.8  CREATININE 1.36* 1.47* 1.08* 1.16* 1.05*    Estimated Creatinine Clearance: 68.2 mL/min (by C-G formula based on Cr of 1.05).    Allergies  Allergen Reactions  . Erythromycin Diarrhea  . Iodine-131 Nausea And Vomiting  . Gadolinium Nausea And Vomiting     Code: VOM, Desc: Pt began vomiting immed post infusion of multihance, Onset Date: 12/22/15   . Ivp Dye [Iodinated Diagnostic Agents] Nausea And Vomiting    Nausea and vomiting   . Metformin Diarrhea     diarrhea  . Penicillins Hives    Has patient had a PCN reaction causing immediate rash, facial/tongue/throat swelling, SOB or lightheadedness with hypotension: No Has patient had a PCN reaction causing severe rash involving mucus membranes or skin necrosis: No Has patient had a PCN reaction that required hospitalization No Has patient had a PCN reaction occurring within the last 10 years: No If all of the above answers are "NO", then may proceed with Cephalosporin use.     Antimicrobials this admission: vanc 2/23>>2/24 zosyn 2/23>>2/27 fluconazole 2/26>> (3/6) ctx 2/27>>2/28 acyclovir 2/28>>3/5 valacyclovir 3/5>>  Microbiology results: 2/22 BCx x2- neg 2/22 MRSA PCR - neg 2/28 cdiff - antigen pos, toxin neg 2/22 urine - neg 3/2 RPR:  nonreactive 3/3 crypto: neg for antigen 3/3 CSF: high glucose and protein 3/3 CSF cx: ng <24h 3/3 HSV CSF - neg  Thank you for allowing pharmacy to be a part of this patient's care.  3/22, PharmD. PGY-1 Pharmacy Resident Pager: [phone] 10/24/2015 9:43 AM

## 2015-10-25 LAB — COMPREHENSIVE METABOLIC PANEL
ALBUMIN: 2.5 g/dL — AB (ref 3.5–5.0)
ALT: 20 U/L (ref 14–54)
AST: 25 U/L (ref 15–41)
Alkaline Phosphatase: 100 U/L (ref 38–126)
Anion gap: 11 (ref 5–15)
BILIRUBIN TOTAL: 0.5 mg/dL (ref 0.3–1.2)
BUN: 19 mg/dL (ref 6–20)
CHLORIDE: 105 mmol/L (ref 101–111)
CO2: 25 mmol/L (ref 22–32)
Calcium: 9.2 mg/dL (ref 8.9–10.3)
Creatinine, Ser: 0.9 mg/dL (ref 0.44–1.00)
GFR calc Af Amer: >60 mL/min (ref >60)
GFR calc non Af Amer: >60 mL/min (ref >60)
GLUCOSE: 213 mg/dL — AB (ref 65–99)
POTASSIUM: 3.9 mmol/L (ref 3.5–5.1)
Sodium: 141 mmol/L (ref 135–145)
Total Protein: 6.5 g/dL (ref 6.5–8.1)

## 2015-10-25 LAB — CSF CULTURE W GRAM STAIN: Culture: NO GROWTH

## 2015-10-25 LAB — GLUCOSE, CAPILLARY
Glucose-Capillary: 211 mg/dL — ABNORMAL HIGH (ref 65–99)
Glucose-Capillary: 341 mg/dL — ABNORMAL HIGH (ref 65–99)

## 2015-10-25 LAB — CSF CULTURE

## 2015-10-25 LAB — VDRL, CSF: SYPHILIS VDRL QUANT CSF: NONREACTIVE

## 2015-10-25 MED ORDER — VALACYCLOVIR HCL 1 G PO TABS: 1000.0000 mg | ORAL_TABLET | Freq: Two times a day (BID) | ORAL | Status: DC

## 2015-10-25 MED ORDER — INSULIN DETEMIR 100 UNIT/ML ~~LOC~~ SOLN: 50.0000 [IU] | Freq: Every day | SUBCUTANEOUS | Status: DC

## 2015-10-25 MED ORDER — CARVEDILOL 3.125 MG PO TABS: 3.1250 mg | ORAL_TABLET | Freq: Two times a day (BID) | ORAL | Status: DC

## 2015-10-25 MED ORDER — HYDRALAZINE HCL 25 MG PO TABS: 25.0000 mg | ORAL_TABLET | Freq: Three times a day (TID) | ORAL | Status: DC

## 2015-10-25 MED ORDER — LEVOTHYROXINE SODIUM 25 MCG PO TABS: 50.0000 ug | ORAL_TABLET | ORAL | Status: DC

## 2015-10-25 MED ORDER — FUROSEMIDE 40 MG PO TABS: 20.0000 mg | ORAL_TABLET | Freq: Every day | ORAL | Status: DC

## 2015-10-25 MED ORDER — VALACYCLOVIR HCL 500 MG PO TABS
500.0000 mg | ORAL_TABLET | Freq: Every day | ORAL | Status: DC
Start: 2015-10-26 — End: 2015-12-04

## 2015-10-25 MED ORDER — FLUCONAZOLE 100 MG PO TABS: 100.0000 mg | ORAL_TABLET | ORAL | Status: AC

## 2015-10-25 NOTE — Progress Notes (Signed)
Patient refused ambulance for discharged.SW made aware.Disharged instruction printed,explained and given to the patient and her mother.Questions answered satisfactorily at the time of discharge.Disharged on stable condition.No major skin issues except herpes simplex wounds scattered on her skin and lips.

## 2015-10-25 NOTE — Progress Notes (Signed)
Physical Therapy Treatment Patient Details Name: [name] MRN: [id number] DOB: July 16, 1961 Today's Date: 10/25/2015    History of Present Illness [name] is an 55 y.o. female with poorly controlled diabetes mellitus with diabetic retinopathy, obstructive sleep apnea, R BKA, L toe amputation who was admitted to [facility] on February 22 with altered mental status endorsing any hallucinations. She is found to be in [place] with glucose in 939 range, without ketones. Lactic acidosis. She is febrile to over 103    PT Comments    [name] has made very good progress with functional mobility and activity tolerance since last PT session; Able to stand and take a few steps on her prosthesis, even with suboptimal fit; Lengthy discussion re: dc plan; I continue to heartily recommend post-acute rehab to maximize independence and safety with mobility prior to dc home; Still, she very much wants to dc straight home -- if she ultimately decides against SNF for rehab, we will need to max out [facility] services and consider ambulance transport home.  Follow Up Recommendations  SNF     Equipment Recommendations  None recommended by PT    Recommendations for Other Services       Precautions / Restrictions Precautions Precautions: Fall Precaution Comments: R BKA has prosthesis--ill fitting at current time due to not having "sock" here only neoprene sleeve. Per pt mother is suppose to bring one in today Restrictions Weight Bearing Restrictions: No    Mobility  Bed Mobility Overal bed mobility: Independent                Transfers Overall transfer level: Needs assistance Equipment used: Rolling walker (2 wheeled) Transfers: Sit to/from Stand Sit to Stand: Min assist         General transfer comment: Cues for hand placement and safety; Min assist to steady with standing to single limb stance; noted she also props her R knee on bed for stability as well; CLose guard for safety with  prosthesis on (supboptimal fit without sock liner); decr control of descent to sit  Ambulation/Gait Ambulation/Gait assistance: Min guard Ambulation Distance (Feet): 8 Feet Assistive device: Rolling walker (2 wheeled) Gait Pattern/deviations: Step-through pattern;Decreased stride length     General Gait Details: Cues to self-monitor for activity tolerance; good use of RW for stability; close guard because of suboptimal fit of prosthesis   Stairs Stairs: Yes Stairs assistance: Mod assist (+2 for safety) Stair Management: Forwards;With walker Number of Stairs: 1 General stair comments: Attempted step-ups for practice of steps in anticipation of going home possibly today; Able to step up one step, though quite weak and requiring mod assist; unable to perform more than that one step, and sat withou warning after the one step  Wheelchair Mobility    Modified Rankin (Stroke Patients Only)       Balance Overall balance assessment: Needs assistance Sitting-balance support: No upper extremity supported;Feet supported Sitting balance-[name]: Good     Standing balance support: Single extremity supported;During functional activity Standing balance-[name]: Fair Standing balance comment: reliant on at least 1 UE support in standing                    Cognition Arousal/Alertness: Awake/alert Behavior During Therapy: WFL for tasks assessed/performed Overall Cognitive Status: Impaired/Different from baseline Area of Impairment: Safety/judgement         Safety/Judgement: Decreased awareness of deficits (and her safety with being home alone)          Exercises  General Comments        Pertinent Vitals/Pain Pain Assessment: No/denies pain    Home Living                      Prior Function            PT Goals (current goals can now be found in the care plan section) Acute Rehab PT Goals Patient Stated Goal: REALLY wants to go home PT Goal  Formulation: With family Time For Goal Achievement: 11/01/15 Potential to Achieve Goals: Good Progress towards PT goals: Progressing toward goals;Goals met and updated - see care plan    Frequency  Min 3X/week    PT Plan Current plan remains appropriate    Co-evaluation PT/OT/SLP Co-Evaluation/Treatment: Yes Reason for Co-Treatment:  (based of all last sesson pt was a +2 for transfers and had n) PT goals addressed during session: Mobility/safety with mobility       End of Session Equipment Utilized During Treatment: Gait belt Activity Tolerance: Patient tolerated treatment well Patient left: in chair;with call bell/phone within reach;with chair alarm set     Time: 1898-4210 PT Time Calculation (min) (ACUTE ONLY): 56 min  Charges:  $Gait Training: 8-22 mins $Therapeutic Activity: 8-22 mins                    G Codes:      Quin Hoop 10/25/2015, 11:16 AM  Roney Marion, [place] Pager [phone] Office [phone]

## 2015-10-25 NOTE — Progress Notes (Signed)
Speech Language Pathology Treatment: Dysphagia  Patient Details Name: [name] MRN: [id number] DOB: 12/21/1960 Today's Date: 10/25/2015 Time: 1959-7471 SLP Time Calculation (min) (ACUTE ONLY): 17 min  Assessment / Plan / Recommendation Clinical Impression  Pt consumed regular textures and thin liquids with one delayed throat clear observed across all intake. She remains afebrile and lung sounds are unchanged. She appears to be tolerating current diet well.   Mother present reports occasional choking episodes PTA. She describes a chronic cough (not associated with PO intake) that has improved since she has been in the hospital. If coughing occurred during meals, pt when then start to get "strangled" on the food that was in her mouth (mother also describes rapid intake with large boluses). Although no apparent signs of difficulty were observed today, SLP reviewed general aspiration precautions for safer pacing. Pt/mother verbalized their understanding.   No further acute SLP needs identified at this time - will sign off. Please reorder if needed.   HPI HPI: 55 yr old with PMH: neuromuscular disorder, pna, chronic bronchitis, DM, presenting with altered mental state, auditory hallucinations (per MD note she is aware they are not real) and elevated glucose levels (300-400). CT head negative. Per MD note, brother reports chronic dysphagia. Intubated 2/22-2/27. CXR 2/27 left base atelectasis. Lungs elsewhere clear.No prior ST notes seen.      SLP Plan  All goals met     Recommendations  Diet recommendations: Regular;Thin liquid Liquids provided via: Cup;Straw Medication Administration: Whole meds with puree Supervision: Patient able to self feed;Intermittent supervision to cue for compensatory strategies Compensations: Minimize environmental distractions;Small sips/bites;Slow rate Postural Changes and/or Swallow Maneuvers: Seated upright 90 degrees;Upright 30-60 min after meal             Oral Care Recommendations: Oral care BID Follow up Recommendations: None Plan: All goals met     GO               Germain Osgood, M.A. CCC-SLP [phone]  Germain Osgood 10/25/2015, 11:11 AM

## 2015-10-25 NOTE — Clinical Social Work Note (Signed)
CSW received consult for SNF placement for ST rehab as physical therapy is recommending skilled placement. Nurse case manager Elnita Maxwell Royal talked with patient today and her discharge plan is home. Home health services set-up by nurse manager.  CSW signing off, however please re-consult if any other social work needs arise prior to d/c.   Genelle Bal, MSW, LCSW Licensed Clinical Social Worker Clinical Social Work Department [employer] [phone]

## 2015-10-25 NOTE — Progress Notes (Signed)
Occupational Therapy Treatment Patient Details Name: [name] MRN: [id number] DOB: 1961-07-07 Today's Date: 10/25/2015    History of present illness [name] is an 55 y.o. female with poorly controlled diabetes mellitus with diabetic retinopathy, obstructive sleep apnea, R BKA, L toe amputation who was admitted to [facility] on February 22 with altered mental status endorsing any hallucinations. She is found to be in HHNK with glucose in 939 range, without ketones. Lactic acidosis. She is febrile to over 103   OT comments  This 55 yo female admitted with above presents to acute OT making progress towards goals, but still not there totally yet as well as really needs to Mod I to Independent to go home since she lives by herself. Our original recommendation was SNF and it still is since she is not Mod I to independent at this time and lives alone.. Pt does not seem open to this--so if she refuses then I have discussed with CM and SW that she will need ambulance transport home and HHOT/PT. We will continue to follow acutely.  Follow Up Recommendations  SNF;Supervision/Assistance - 24 hour;Other (comment) (pt does not seem open to this right now, if not then needs HHOT)    Equipment Recommendations  None recommended by OT (pt reports she has a 3n1 and a shower seat)       Precautions / Restrictions Precautions Precautions: Fall Precaution Comments: R BKA has prosthesis--ill fitting at current time due to not having "sock" here only neoprene sleeve. Per pt mother is suppose to bring one in today Restrictions Weight Bearing Restrictions: No       Mobility Bed Mobility Overal bed mobility: Independent                Transfers Overall transfer level: Needs assistance Equipment used: Rolling walker (2 wheeled) Transfers: Sit to/from Stand Sit to Stand: Min guard         General transfer comment: Min A for ambulation with prothesis    Balance Overall balance  assessment: Needs assistance Sitting-balance support: No upper extremity supported;Feet supported Sitting balance-[name]: Good     Standing balance support: Single extremity supported;During functional activity Standing balance-[name]: Fair Standing balance comment: reliant on at least 1 UE support in standing                   ADL Overall ADL's : Needs assistance/impaired     Grooming: Set up;Sitting;Brushing hair;Wash/dry face               Lower Body Dressing: Set up (with min guard A sit<>stand)   Toilet Transfer: Minimal assistance;Ambulation;RW (bed>recliner 5 feet away)   Toileting- Clothing Manipulation and Hygiene: Min guard;Sit to/from stand                          Cognition   Behavior During Therapy: [facility] for tasks assessed/performed Overall Cognitive Status: Impaired/Different from baseline Area of Impairment: Safety/judgement          Safety/Judgement: Decreased awareness of deficits (and her safety with being home alone)                       Pertinent Vitals/ Pain       Pain Assessment: No/denies pain         Frequency Min 2X/week     Progress Toward Goals  OT Goals(current goals can now be found in the care plan section)  Progress towards OT goals: Progressing toward goals     Plan Discharge plan remains appropriate    Co-evaluation    PT/OT/SLP Co-Evaluation/Treatment: Yes Reason for Co-Treatment:  (based of all last sesson pt was a +2 for transfers and had not tried ambulation as of yet with prothesis)          End of Session Equipment Utilized During Treatment: Gait belt;Rolling walker   Activity Tolerance  (pt reports standing for any period of time is fatiguing right now)   Patient Left in chair;with call bell/phone within reach;with chair alarm set   Nurse Communication          Time: 6440-3474 OT Time Calculation (min): 56 min  Charges: OT General Charges $OT Visit: 1  Procedure OT Treatments $Self Care/Home Management : 23-37 mins  Evette Georges [phone] 10/25/2015, 10:30 AM

## 2015-10-25 NOTE — Progress Notes (Signed)
Inpatient Diabetes Program Recommendations  AACE/ADA: New Consensus Statement on Inpatient Glycemic Control (2015)  Target Ranges:  Prepandial:   less than 140 mg/dL      Peak postprandial:   less than 180 mg/dL (1-2 hours)      Critically ill patients:  140 - 180 mg/dL   Results for TULIP, MEHARG (MRN [id number]) as of 10/25/2015 10:03  Ref. Range 10/24/2015 07:46 10/24/2015 12:21 10/24/2015 17:45 10/24/2015 20:18 10/25/2015 07:37  Glucose-Capillary Latest Ref Range: 65-99 mg/dL 086 (H) 761 (H) 950 (H) 339 (H) 211 (H)   Review of Glycemic Control  Diabetes history: DM 2 Outpatient Diabetes medications: Levemir 90 Daily, Novolog 8-28 units 4x/day, 13 units TID meal coverage Current orders for Inpatient glycemic control: Levemir 40 units, Novolog Moderate + HS scale  Inpatient Diabetes Program Recommendations:   Fasting glucose was 211 after 40 units of basal total given yesterday. Please increase basal insulin to 45 units Daily today. May need an additional 5 units given today.  Thanks,  Christena Deem RN, MSN, [facility] Inpatient Diabetes Coordinator Team Pager [phone] (8a-5p)

## 2015-10-25 NOTE — Progress Notes (Signed)
Patient's mother requested to her daughter's breakfast tray to be heated.The writer explained politely that would not be possible since the patient is positive on C-diff antigen thus she is on enteric precautions.Explained hospital policy on isolation,.

## 2015-10-25 NOTE — Care Management Important Message (Signed)
Important Message  Patient Details  Name: [name] MRN: [id number] Date of Birth: 1960/09/09   Medicare Important Message Given:  Yes    Rameen Quinney, Annamarie Major, RN 10/25/2015, 12:10 PM

## 2015-10-25 NOTE — Care Management Note (Signed)
Case Management Note  Patient Details  Name: [name] MRN: [id number] Date of Birth: [birth year]  Subjective/Objective:        CM following for progression and d/c planning.            Action/Plan: 10/25/2015 Met with pt and mother, this pt plans to d/c to home today, per PT eval recommendation for SNF continues, however pt wishes to d/c to home. Pt has walker, wheelchair, cane, shower chair and 3:1 at home. Pt also had PCS aide 2 hr per day , 7 days per week. Pt mother has notified PCS worker that the pt will be d/c. [place] notified that pt has selected that agency for [facility] services.   Expected Discharge Date:      10/25/2015            Expected Discharge Plan:  [place]  In-House Referral:  Clinical Social Work  Discharge planning Services  CM Consult  Post Acute Care Choice:  Home Health Choice offered to:  Patient  DME Arranged:  N/A DME Agency:  NA  HH Arranged:  RN, PT, PCS/Personal Care Services, Nurse's Aide [place] Agency:  [place]  Status of Service:  Completed, signed off  Medicare Important Message Given:  Yes Date Medicare IM Given:    Medicare IM give by:    Date Additional Medicare IM Given:    Additional Medicare Important Message give by:     If discussed at [place] of Stay Meetings, dates discussed:    Additional Comments:  Adron Bene, RN 10/25/2015, 11:32 AM

## 2015-10-26 NOTE — Progress Notes (Signed)
Received message for return call to [facility] in regards to valtrex instructions. [name] questioned the need for 2 prescriptions of Valtrex. Informed [name] that per Dr. Eliane Decree note states pt's valtrex dose will be reduced.

## 2015-10-27 DIAGNOSIS — B001 Herpesviral vesicular dermatitis: Secondary | ICD-10-CM | POA: Insufficient documentation

## 2015-10-27 NOTE — Discharge Summary (Signed)
[facility] Discharge Summary   Patient: [name] HAL:[id number]   PCP: [name], MD DOB: 17-Jul-1961   Date of admission: 10/13/2015   Date of discharge: 10/25/2015    Discharge Diagnoses:  Principal Problem:   Diabetes mellitus due to underlying condition with hyperosmolarity without nonketotic hyperglycemic-hyperosmolar coma [facility]) (HCC) Active Problems:   Neuropathy (HCC)   Hyponatremia   HTN (hypertension)   Hypothyroidism   OSA on CPAP with oxygen   COPD (chronic obstructive pulmonary disease), on home O2   Bradycardia   CKD (chronic kidney disease) stage 2, GFR 60-89 ml/min   AKI (acute kidney injury) (HCC)   Diastolic CHF (HCC)   Acute encephalopathy   Acute respiratory failure with hypoxia (HCC)   HSV-1 (herpes simplex virus 1) infection   Hallucinations   Thrush   Altered mental status   Recommendations for Outpatient Follow-up:  1. Follow-up with PCP in one week to adjust insulin dose  Follow-up Information    Follow up with TALBOT, DAVID C, MD. Schedule an appointment as soon as possible for a visit in 1 week.   Specialty:  Internal Medicine   Why:  APPOINTMENT: Office is notified of the need for appointment and will call either [place] to the patient back to setup appointment   Contact information:   [address] [place] [address] [phone]       Diet recommendation: Carb modified diet  Activity: The patient is advised to gradually reintroduce usual activities.  Discharge Condition: fair  History of present illness: As per the H and P dictated on admission, "[name] is a 55 y.o. female  Level V caveat: Patient presenting with altered mental state. Patient actively endorsing auditory hallucinations. Patient states that she is "hearing stuff. " Patient states that she recognizes that she is hearing voices and knows that they are not real. States that the voices are telling her to take her medicines and to go to  sleep. Patient states that her home glucose ranges from 300-400. States that she has reversed before and is being treated by her primary care doctor Dr. Reche Dixon. Patient's only complaint at this time is that her sugars are high and she occasionally has to pee but states that this is normal. "  Hospital Course:  Summary of her active problems in the hospital is as following. 1. 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 She was given IV fluids and IV insulin drip. Usually consulted to [facility] for [facility] but later on was transferred to critical care for further care. Patient was transferred to hospitalist on 10/20/2015. Blood glucose continues to remain elevated. Patient will be discharged and 50 units of insulin along with sliding scale Hemoglobin A1c 11.9 She will do follow-up with PCP in one week to adjust her insulin dose. Patient has not been using her Byetta at home as well as NovoLog at home therefore we will discontinue this medications. Primary reason for patient's initial hyperglycemia was noncompliance with medical regimen and therefore I feel that current insulin regimen should prevent her from having life-threatening hypoglycemia as long as she remains compliant  2. Acute encephalopathy. Herpes simplex labialis. Initial CT of the head was unremarkable. Later on the patient developed labial rash suspicious for herpes simplex 1. Infectious diseases was consulted. The patient was started on IV acyclovir. Patient's encephalopathy appears to be worsening today.  CT  of the head with and without contrast did not show any acute abnormality. EEG is negative. Neurology was consulted for further input. IR was consulted for lumbar puncture. Lumbar puncture does not show any evidence of acute infection, HSV PCR is negative. We  will change the acyclovir to Valtrex oral Discontinue gabapentin on discharge due to encephalopathy and increased somnolence. PCP will decide AS AN OUTPATIENT WHETHER To CONTINUE VALACYCLOVIR as [place]IF[place] OR NOT.  3. Acute respiratory failure with hypoxia. Most likely due to acute encephalopathy currently stable on room air.  4. chronic diastolic dysfunction. Doesn't appear to be volume overloaded after receiving IV fluids in the ICU. Resume Lasix at half dose  5. acute on chronic kidney disease. Most likely due to dehydration due to hyperglycemia. Nephrotoxic medications were initially discontinued. Currently her renal function has been back to baseline. Need to monitor renal function after giving her IV contrast.  6. hypothyroidism. Continue Synthroid. Given that the patient's TSH has been elevated, with normal free T4, I will increase her Synthroid to 50 g from 25 due to her persistent complaint of weakness, fatigue as well as somnolence.  8. oral thrush. On Diflucan. Finish total 14 day treatment  9. Anemia with thrombocytopenia. Patient was having anemia in the ICU with thrombocytopenia currently all her counts are stable.  10. Essential hypertension. Blood pressure stable. Continue hydralazine.   11. NSVT. No further event, likely artifact. Telemetry discontinued.  12. Neuropathy. Patient was started on home dose of gabapentin but she become more somnolent and therefore it has been discontinued.  13. Obstructive sleep apnea. Noncompliant with C-pap   All other chronic medical condition were stable during the hospitalization.  Patient was seen by physical therapy, who recommended SNF, but patient and family refused to go to SNF  Therefore home health was arranged by Child psychotherapist and case Production designer, theatre/television/film. On the day of the discharge the patient's sugars were acceptable, mentation improved, vitals remained stable, and no other acute medical condition were reported by patient.  the patient was felt safe to be discharge at home with home health.  Procedures and Results:  Lumbar puncture   EEG Unremarkable awake and drowsy routine inpatient EEG. Clinical correlation is recommended   Echocardiogram Study Conclusions  - Left ventricle: The cavity size was normal. There was mild  concentric hypertrophy. Systolic function was normal. The  estimated ejection fraction was in the range of 55% to 60%.  Although no diagnostic regional wall motion abnormality was  identified, this possibility cannot be completely excluded on the  basis of this study. - Aortic valve: There was mild regurgitation. - Mitral valve: Mildly calcified annulus. Consultations:  Critical care, neurology, infectious disease  DISCHARGE MEDICATION: Discharge Medication List as of 10/25/2015  2:34 PM    START taking these medications   Details  carvedilol (COREG) 3.125 MG tablet Take 1 tablet (3.125 mg total) by mouth 2 (two) times daily with a meal., Starting 10/25/2015, Until Discontinued, Normal    fluconazole (DIFLUCAN) 100 MG tablet Take 1 tablet (100 mg total) by mouth daily., Starting 10/26/2015, Until Sun 10/31/15, Normal    hydrALAZINE (APRESOLINE) 25 MG tablet Take 1 tablet (25 mg total) by mouth every 8 (eight) hours., Starting 10/25/2015, Until Discontinued, Normal    !! valACYclovir (VALTREX) 1000 MG tablet Take 1 tablet (1,000 mg total) by mouth 2 (two) times daily., Starting 10/25/2015, Until Discontinued, Normal    !! valACYclovir (VALTREX) 500 MG tablet Take 1 tablet (500 mg total) by mouth daily.,  Starting 10/26/2015, Until Discontinued, Normal     !! - Potential duplicate medications found. Please discuss with provider.    CONTINUE these medications which have CHANGED   Details  furosemide (LASIX) 40 MG tablet Take 0.5 tablets (20 mg total) by mouth daily., Starting 10/25/2015, Until Discontinued, Normal    insulin detemir (LEVEMIR) 100 UNIT/ML injection Inject 0.5 mLs (50  Units total) into the skin daily., Starting 10/25/2015, Until Discontinued, Normal    levothyroxine (SYNTHROID, LEVOTHROID) 25 MCG tablet Take 2 tablets (50 mcg total) by mouth every morning., Starting 10/25/2015, Until Discontinued, Normal      CONTINUE these medications which have NOT CHANGED   Details  aspirin EC 81 MG tablet Take 81 mg by mouth daily., Until Discontinued, Historical Med    atorvastatin (LIPITOR) 80 MG tablet Take 80 mg by mouth daily., Until Discontinued, Historical Med    insulin aspart (NOVOLOG) 100 UNIT/ML injection Inject 13 Units into the skin 3 (three) times daily with meals., Starting 12/14/2014, Until Discontinued, Normal    omeprazole (PRILOSEC) 20 MG capsule Take 20 mg by mouth daily., Until Discontinued, Historical Med    ondansetron (ZOFRAN ODT) 8 MG disintegrating tablet Take 1 tablet (8 mg total) by mouth every 8 (eight) hours as needed for nausea., Starting 09/15/2014, Until Discontinued, Print    oxybutynin (DITROPAN) 5 MG tablet Take 5 mg by mouth every morning.  , Until Discontinued, Historical Med      STOP taking these medications     Exenatide ER 2 MG PEN      gabapentin (NEURONTIN) 300 MG capsule      exenatide (BYETTA) 5 MCG/0.02ML SOPN injection      nitrofurantoin, macrocrystal-monohydrate, (MACROBID) 100 MG capsule      NOVOLOG FLEXPEN 100 UNIT/ML FlexPen        Allergies  Allergen Reactions  . Erythromycin Diarrhea  . Iodine-131 Nausea And Vomiting  . Gadolinium Nausea And Vomiting     Code: VOM, Desc: Pt began vomiting immed post infusion of multihance, Onset Date: [id number]   . Ivp Dye [Iodinated Diagnostic Agents] Nausea And Vomiting    Nausea and vomiting   . Metformin Diarrhea     diarrhea  . Penicillins Hives    Has patient had a PCN reaction causing immediate rash, facial/tongue/throat swelling, SOB or lightheadedness with hypotension: No Has patient had a PCN reaction causing severe rash involving mucus membranes or skin  necrosis: No Has patient had a PCN reaction that required hospitalization No Has patient had a PCN reaction occurring within the last 10 years: No If all of the above answers are "NO", then may proceed with Cephalosporin use.    Discharge Instructions    Diet - low sodium heart healthy    Complete by:  As directed      Diet Carb Modified    Complete by:  As directed      Discharge instructions    Complete by:  As directed   It is important that you read following instructions as well as go over your medication list with RN to help you understand your care after this hospitalization.  Discharge Instructions: Please follow-up with PCP in one week  Please request your primary care physician to go over all Hospital Tests and Procedure/Radiological results at the follow up,  Please get all Hospital records sent to your PCP by signing hospital release before you go home.   Do not drive, operating heavy machinery, perform activities at heights, swimming or participation  in water activities or provide baby sitting services, until you have been seen by Primary Care Physician or a Neurologist and advised to do so again. Do not take more than prescribed Pain, Sleep and Anxiety Medications. You were cared for by a hospitalist during your hospital stay. If you have any questions about your discharge medications or the care you received while you were in the hospital after you are discharged, you can call the unit and ask to speak with the hospitalist on call if the hospitalist that took care of you is not available.  Once you are discharged, your primary care physician will handle any further medical issues. Please note that NO REFILLS for any discharge medications will be authorized once you are discharged, as it is imperative that you return to your primary care physician (or establish a relationship with a primary care physician if you do not have one) for your aftercare needs so that they can reassess  your need for medications and monitor your lab values. You Must read complete instructions/literature along with all the possible adverse reactions/side effects for all the Medicines you take and that have been prescribed to you. Take any new Medicines after you have completely understood and accept all the possible adverse reactions/side effects. Wear Seat belts while driving.     Increase activity slowly    Complete by:  As directed           Discharge Exam: Filed Weights   10/23/15 0340 10/24/15 0500 10/24/15 2022  Weight: 90.719 kg (200 lb) 90.717 kg (199 lb 15.9 oz) 91.2 kg (201 lb 1 oz)   Filed Vitals:   10/25/15 0351 10/25/15 1000  BP: 138/63 139/57  Pulse: 74 72  Temp: 98.5 F (36.9 C) 98.6 F (37 C)  Resp: 19 20   General: Appear in no distress, improvement in facial Rash; Oral Mucosa moist. Cardiovascular: S1 and S2 Present, no Murmur, no JVD Respiratory: Bilateral Air entry present and Clear to Auscultation, no Crackles, no wheezes Abdomen: Bowel Sound present, Soft and no tenderness Extremities: no Pedal edema, no calf tenderness Neurology: Grossly no focal neuro deficit.  The results of significant diagnostics from this hospitalization (including imaging, microbiology, ancillary and laboratory) are listed below for reference.    Significant Diagnostic Studies: Ct Abdomen Pelvis Wo Contrast  10/14/2015  CLINICAL DATA:  Lactate acid acidosis. EXAM: CT ABDOMEN AND PELVIS WITHOUT CONTRAST TECHNIQUE: Multidetector CT imaging of the abdomen and pelvis was performed following the standard protocol without IV contrast. COMPARISON:  CT 08/16/2013 FINDINGS: Lower chest: The heart is enlarged. There are linear bibasilar opacities, left greater than right. No pleural effusion. Liver: Hepatic steatosis. No focal lesion. No pneumobilia or portal venous gas. Hepatobiliary: Gallbladder physiologically distended, no calcified stone. No biliary dilatation. Pancreas: No ductal dilatation  or inflammation. Spleen: Normal. Adrenal glands: 12 mm hypodense nodule from the right adrenal gland consistent with benign adenoma, unchanged. Left adrenal gland is normal. Kidneys: No hydronephrosis. Minimal left ureteral prominence without ribs urolithiasis. No ureteral calculus. Mild nonspecific perinephric stranding. Stomach/Bowel: Stomach physiologically distended. There are no dilated or thickened small bowel loops. Small volume of stool throughout the colon without colonic wall thickening. No findings to suggest bowel ischemia. Colonic diverticulosis distally without diverticulitis. The appendix is normal. Vascular/Lymphatic: No retroperitoneal adenopathy. Small right common iliac node measures 8 mm short axis, nonspecific. Prominent inguinal nodes are unchanged. Abdominal aorta is normal in caliber. Mild atherosclerosis without aneurysm. Reproductive: Uterus and adnexa normal for age. Bladder:  Decompressed by Foley catheter. Other: No free air, free fluid, or intra-abdominal fluid collection. Sebaceous cyst in the posterior left flank soft tissues is unchanged. Musculoskeletal: There are no acute or suspicious osseous abnormalities. IMPRESSION: 1. No acute abnormality in the abdomen/pelvis or findings to explain lactic acidosis. 2. Right adrenal adenoma, stable from prior. Electronically Signed   By: Rubye Oaks M.D.   On: 10/14/2015 03:11   Dg Abd 1 View  10/18/2015  CLINICAL DATA:  K56.7 (ICD-10-CM) - Ileus (HCC) Extubated/? ileus EXAM: ABDOMEN - 1 VIEW COMPARISON:  CT 10/14/2015 FINDINGS: Paucity of small bowel gas. Colon is nondilated, with residual oral contrast in the descending and sigmoid portions. Transvenous pacing lead is partially seen. Regional bones unremarkable. Right pelvic phlebolith. IMPRESSION: 1. Normal bowel gas pattern with residual oral contrast material in the colon. Electronically Signed   By: Corlis Leak M.D.   On: 10/18/2015 10:07   Ct Head Wo Contrast  10/13/2015   CLINICAL DATA:  Acute encephalopathy. Altered mental status. Hallucinations. EXAM: CT HEAD WITHOUT CONTRAST TECHNIQUE: Contiguous axial images were obtained from the base of the skull through the vertex without intravenous contrast. COMPARISON:  10/01/2015 FINDINGS: Study is degraded by motion. Allowing for that, there is no evidence of old or acute infarction, mass lesion, hemorrhage, hydrocephalus or extra-axial collection. Sinuses are clear. No calvarial abnormality. IMPRESSION: Motion degraded study.  Normal examination allowing for that. Electronically Signed   By: Paulina Fusi M.D.   On: 10/13/2015 14:21   Ct Head Wo Contrast  10/01/2015  CLINICAL DATA:  55 year old female with fall and laceration to the left cheek. EXAM: CT HEAD WITHOUT CONTRAST CT MAXILLOFACIAL WITHOUT CONTRAST TECHNIQUE: Multidetector CT imaging of the head and maxillofacial structures were performed using the standard protocol without intravenous contrast. Multiplanar CT image reconstructions of the maxillofacial structures were also generated. COMPARISON:  Head CT dated 10/21/2013 FINDINGS: CT HEAD FINDINGS There is mild prominence of the ventricles and sulci compatible with age-related atrophy. Mild periventricular and deep white matter hypodensities compatible with chronic microvascular ischemic changes. No acute intracranial hemorrhage. No mass effect or midline shift. The visualized paranasal sinuses and mastoid air cells are well aerated. The calvarium is intact. Left periorbital skin laceration with small pockets of gas noted. CT MAXILLOFACIAL FINDINGS There is no acute facial bone fracture. The maxilla, mandible, and pterygoid plates are intact. The globes, retro-orbital fat, and orbital walls are preserved. There is small laceration of the left preorbital soft tissue with small pockets of air. No large hematoma identified. The visualized paranasal sinuses are clear. IMPRESSION: No acute intracranial hemorrhage. No acute facial  bone fractures. Electronically Signed   By: Elgie Collard M.D.   On: 10/01/2015 00:53   Ct Head W Wo Contrast  10/21/2015  CLINICAL DATA:  Follow-up acute encephalopathy. History of diabetes, rheumatoid arthritis, concussion, hyperlipidemia, sepsis. EXAM: CT HEAD WITHOUT AND WITH CONTRAST TECHNIQUE: Contiguous axial images were obtained from the base of the skull through the vertex without and with intravenous contrast CONTRAST:  75mL OMNIPAQUE IOHEXOL 300 MG/ML  SOLN COMPARISON:  CT head October 13, 2015 FINDINGS: Mild ventriculomegaly on the basis of global parenchymal brain volume loss. No intraparenchymal hemorrhage, mass effect nor midline shift. No acute large vascular territory infarcts. Mild white matter changes compatible with chronic small vessel ischemic disease. No abnormal intracranial enhancement. No abnormal extra-axial fluid collections. Basal cisterns are patent. No skull fracture. The included ocular globes and orbital contents are non-suspicious. Status post bilateral ocular lens implants.  The mastoid aircells and included paranasal sinuses are well-aerated. Patient is edentulous. IMPRESSION: No acute intracranial process. Mild global parenchymal brain volume loss for age. Mild chronic small vessel ischemic disease. Electronically Signed   By: Awilda Metro M.D.   On: 10/21/2015 01:41   Dg Chest Port 1 View  10/18/2015  CLINICAL DATA:  Hypoxia EXAM: PORTABLE CHEST 1 VIEW COMPARISON:  October 17, 2015 FINDINGS: Endotracheal tube tip is 4.4 cm above the carina. Central catheter tip is in the superior cava. Nasogastric tube tip and side port are below the diaphragm. Pacemaker leads are attached to the right atrium and right ventricle. No pneumothorax. There is atelectatic change in the left base. The lungs elsewhere are clear. Heart size and pulmonary vascularity are normal. No adenopathy. No bone lesions. IMPRESSION: Tube and catheter positions as described without pneumothorax. Left  base atelectasis. Lungs elsewhere clear. No change in cardiac silhouette. Electronically Signed   By: Bretta Bang III M.D.   On: 10/18/2015 07:13   Dg Chest Port 1 View  10/17/2015  CLINICAL DATA:  ET tube present EXAM: PORTABLE CHEST 1 VIEW COMPARISON:  10/16/2015 FINDINGS: Support devices including endotracheal tube are stable. Left pacer is stable. Mild cardiomegaly. Patchy left upper lobe and left basilar airspace opacities. This is similar to prior study. Minimal right base atelectasis, improved. No visible effusions. No acute bony abnormality. IMPRESSION: Patchy left upper lobe and left base atelectasis or infiltrate. Improving right base atelectasis. Electronically Signed   By: Charlett Nose M.D.   On: 10/17/2015 07:17   Dg Chest Port 1 View  10/16/2015  CLINICAL DATA:  Evaluate ETT. EXAM: PORTABLE CHEST 1 VIEW COMPARISON:  October 15, 2015 FINDINGS: The support apparatus, including the ET tube, is stable in the interval. Left basilar atelectasis is stable. Mild opacity in the medial right lung base has a linear component, likely atelectasis, also stable. No change in the cardiomediastinal silhouette. No pneumothorax. IMPRESSION: No interval change in support apparatus or bibasilar atelectasis. Electronically Signed   By: Gerome Sam III M.D   On: 10/16/2015 07:11   Dg Chest Port 1 View  10/15/2015  CLINICAL DATA:  Intubated patient, acute respiratory failure, CHF, diabetes and hyperosmolarity EXAM: PORTABLE CHEST 1 VIEW COMPARISON:  Portable chest x-ray of October 13, 2015 FINDINGS: The lungs are adequately inflated. The interstitial markings remain mildly increased on the left. There is left basilar atelectasis. There is no significant pleural effusion. The right lung is clear. The heart is normal in size. The pulmonary vascularity is not engorged. The endotracheal tube tip lies approximately 5.2 cm above the carina. The esophagogastric tube tip projects below the inferior margin of the  image. The right internal jugular venous catheter tip projects over the midportion of the SVC. The permanent pacemaker is in stable position. IMPRESSION: Stable appearance of the chest since the earlier study. There is left lower lobe atelectasis. The support tubes are in reasonable position. Electronically Signed   By: David  [place] M.D.   On: 10/15/2015 07:31   Dg Chest Port 1 View  10/13/2015  CLINICAL DATA:  Intubated EXAM: PORTABLE CHEST 1 VIEW COMPARISON:  08/25/2014 FINDINGS: Endotracheal tube has its tip 2.5 cm above the carina. Nasogastric tube enters the stomach. Right internal jugular central line has its tip in the SVC above the right atrium. Dual lead pacemaker has leads in the region of the right atrium and right ventricle. There is my normal atelectasis at the lung bases. No pulmonary edema. No consolidation.  IMPRESSION: Lines and tubes well positioned.  Minimal basilar atelectasis. Electronically Signed   By: Paulina Fusi M.D.   On: 10/13/2015 16:08   Dg Abd Portable 1v  10/13/2015  CLINICAL DATA:  Nasogastric catheter placement EXAM: PORTABLE ABDOMEN - 1 VIEW COMPARISON:  10/13/2015 FINDINGS: Nasogastric catheter is again seen but now directed towards the distal stomach. Scattered large and small bowel gas is noted. IMPRESSION: Nasogastric catheter as described. Electronically Signed   By: Alcide Clever M.D.   On: 10/13/2015 21:30   Dg Abd Portable 1v  10/13/2015  CLINICAL DATA:  Orogastric tube placement. EXAM: PORTABLE ABDOMEN - 1 VIEW COMPARISON:  Earlier today. FINDINGS: Orogastric tube tip in the proximal stomach. Normal bowel gas pattern. Thoracolumbar spine degenerative changes and mild scoliosis. IMPRESSION: Orogastric tube tip in the proximal stomach. Electronically Signed   By: Beckie Salts M.D.   On: 10/13/2015 19:17   Dg Abd Portable 1v  10/13/2015  CLINICAL DATA:  OG tube placement. EXAM: PORTABLE ABDOMEN - 1 VIEW COMPARISON:  Earlier the same day. FINDINGS: 1811 hours. There  is no evidence for OG tube overlying the lower mediastinum or upper abdomen. Bowel gas pattern is nonspecific. IMPRESSION: No evidence for OG tube over the lower chest or upper abdomen. Consider chest x-ray to assess tube position or tube removal and replacement These results will be called to the ordering clinician or representative by the Radiologist Assistant, and communication documented in the PACS or zVision Dashboard. Electronically Signed   By: Kennith Center M.D.   On: 10/13/2015 18:36   Dg Abd Portable 1v  10/13/2015  CLINICAL DATA:  Orogastric tube placement EXAM: PORTABLE ABDOMEN - 1 VIEW COMPARISON:  None. FINDINGS: Orogastric tube tip is in the mid body of the stomach. Side hole is just pass the gastroesophageal junction. IMPRESSION: Orogastric tube tip in the mid body of the stomach. Electronically Signed   By: Paulina Fusi M.D.   On: 10/13/2015 16:09   Ct Maxillofacial Wo Cm  10/01/2015  CLINICAL DATA:  55 year old female with fall and laceration to the left cheek. EXAM: CT HEAD WITHOUT CONTRAST CT MAXILLOFACIAL WITHOUT CONTRAST TECHNIQUE: Multidetector CT imaging of the head and maxillofacial structures were performed using the standard protocol without intravenous contrast. Multiplanar CT image reconstructions of the maxillofacial structures were also generated. COMPARISON:  Head CT dated 10/21/2013 FINDINGS: CT HEAD FINDINGS There is mild prominence of the ventricles and sulci compatible with age-related atrophy. Mild periventricular and deep white matter hypodensities compatible with chronic microvascular ischemic changes. No acute intracranial hemorrhage. No mass effect or midline shift. The visualized paranasal sinuses and mastoid air cells are well aerated. The calvarium is intact. Left periorbital skin laceration with small pockets of gas noted. CT MAXILLOFACIAL FINDINGS There is no acute facial bone fracture. The maxilla, mandible, and pterygoid plates are intact. The globes,  retro-orbital fat, and orbital walls are preserved. There is small laceration of the left preorbital soft tissue with small pockets of air. No large hematoma identified. The visualized paranasal sinuses are clear. IMPRESSION: No acute intracranial hemorrhage. No acute facial bone fractures. Electronically Signed   By: Elgie Collard M.D.   On: 10/01/2015 00:53   Dg Fluoro Guide Lumbar Puncture  10/22/2015  CLINICAL DATA:  Acute encephalopathy. EXAM: DIAGNOSTIC LUMBAR PUNCTURE UNDER FLUOROSCOPIC GUIDANCE FLUOROSCOPY TIME:  If the device does not provide the exposure index: Fluoroscopy Time (in minutes and seconds):  26 seconds Number of Acquired Images:  2 PROCEDURE: Informed consent was obtained from  the patient's brother because the patient is unable to give consent due to the encephalopathy. The risks of the procedure were discussed and informed consent was obtained over the telephone. With the patient prone, the lower back was prepped with Betadine. 1% Lidocaine was used for local anesthesia. Lumbar puncture was performed at the L4-L5 level using a 20 gauge needle. Only a trace amount of CSF was noted in the needle hub and this was probably related to the length of the needle and the patient's body habitus. The needle was repositioned a few times. Eventually, the table was elevated in order to help CSF drainage. Initially, a small amount of blood tinged CSF was noted but the CSF quickly cleared. 10.5 mL of CSF was removed. Opening pressure was approximately 13 cm. The patient tolerated the procedure well and there were no apparent complications. IMPRESSION: Successful fluoroscopic guided lumbar puncture. Electronically Signed   By: Richarda Overlie M.D.   On: 10/22/2015 10:05    Microbiology: Recent Results (from the past 240 hour(s))  C difficile quick scan w PCR reflex     Status: Abnormal   Collection Time: 10/19/15  7:52 AM  Result Value Ref Range Status   C Diff antigen POSITIVE (A) NEGATIVE Final   C  Diff toxin NEGATIVE NEGATIVE Final   C Diff interpretation   Final    C. difficile present, but toxin not detected. This indicates colonization. In most cases, this does not require treatment. If patient has signs and symptoms consistent with colitis, consider treatment. Requires ENTERIC precautions.  CSF culture     Status: None   Collection Time: 10/22/15  9:15 AM  Result Value Ref Range Status   Specimen Description CSF  Final   Special Requests NONE  Final   Gram Stain   Final    CYTOSPIN SMEAR WBC PRESENT, PREDOMINANTLY MONONUCLEAR NO ORGANISMS SEEN    Culture NO GROWTH 3 DAYS  Final   Report Status 10/25/2015 FINAL  Final   Labs: CBC:  Recent Labs Lab 10/21/15 0634 10/22/15 1018  WBC 5.3 5.8  NEUTROABS 2.5 2.8  HGB 11.7* 11.5*  HCT 34.8* 34.9*  MCV 82.9 83.5  PLT 286 307   Basic Metabolic Panel:  Recent Labs Lab 10/21/15 0634 10/22/15 1018 10/25/15 0401  NA 140 141 141  K 3.9 3.6 3.9  CL 103 102 105  CO2 25 28 25   GLUCOSE 289* 235* 213*  BUN 12 13 19   CREATININE 1.16* 1.05* 0.90  CALCIUM 9.0 9.4 9.2  MG 2.0 1.9  --    Liver Function Tests:  Recent Labs Lab 10/21/15 0634 10/22/15 1018 10/25/15 0401  AST 21 27 25   ALT 17 21 20   ALKPHOS 112 102 100  BILITOT 0.4 0.5 0.5  PROT 5.9* 6.7 6.5  ALBUMIN 2.2* 2.3* 2.5*   No results for input(s): LIPASE, AMYLASE in the last 168 hours.  Recent Labs Lab 10/21/15 0634  AMMONIA 36*   Cardiac Enzymes:  Recent Labs Lab 10/22/15 1018 10/22/15 1627 10/22/15 2223  TROPONINI 0.08* 0.10* 0.07*   BNP (last 3 results) No results for input(s): BNP in the last 8760 hours. CBG:  Recent Labs Lab 10/24/15 1221 10/24/15 1745 10/24/15 2018 10/25/15 0737 10/25/15 1149  GLUCAP 271* 360* 339* 211* 341*   Time spent: 30 minutes  Signed:  Branston Halsted  [facility] 10/25/2015 , 7:51 AM

## 2015-11-04 LAB — CUP PACEART REMOTE DEVICE CHECK
Date Time Interrogation Session: 20170316103330
Implantable Lead Implant Date: 20150716
Implantable Lead Location: 753859
Implantable Lead Model: 5076
Lead Channel Pacing Threshold Amplitude: 0.9 V
Lead Channel Pacing Threshold Amplitude: 1 V
Lead Channel Pacing Threshold Pulse Width: 0.4 ms
Lead Channel Sensing Intrinsic Amplitude: 11.4 mV
MDC IDC LEAD IMPLANT DT: 20150716
MDC IDC LEAD LOCATION: 753860
MDC IDC MSMT LEADCHNL RA IMPEDANCE VALUE: 351 Ohm
MDC IDC MSMT LEADCHNL RA SENSING INTR AMPL: 4.6 mV
MDC IDC MSMT LEADCHNL RV IMPEDANCE VALUE: 429 Ohm
MDC IDC MSMT LEADCHNL RV PACING THRESHOLD PULSEWIDTH: 0.4 ms
MDC IDC PG SERIAL: 68266686
MDC IDC STAT BRADY RA PERCENT PACED: 60 %
MDC IDC STAT BRADY RV PERCENT PACED: 2 %

## 2015-11-05 ENCOUNTER — Encounter: Payer: Self-pay | Admitting: Cardiology

## 2015-12-04 ENCOUNTER — Encounter (HOSPITAL_COMMUNITY): Payer: Self-pay | Admitting: Emergency Medicine

## 2015-12-04 ENCOUNTER — Observation Stay (HOSPITAL_COMMUNITY)
Admission: EM | Admit: 2015-12-04 | Discharge: 2015-12-07 | Disposition: A | Discharge status: Home / Self Care | Payer: Medicare HMO | Attending: Internal Medicine | Admitting: Internal Medicine

## 2015-12-04 ENCOUNTER — Emergency Department (HOSPITAL_COMMUNITY): Payer: Medicare HMO

## 2015-12-04 DIAGNOSIS — Z950 Presence of cardiac pacemaker: Secondary | ICD-10-CM | POA: Diagnosis not present

## 2015-12-04 DIAGNOSIS — E860 Dehydration: Secondary | ICD-10-CM | POA: Insufficient documentation

## 2015-12-04 DIAGNOSIS — I5032 Chronic diastolic (congestive) heart failure: Secondary | ICD-10-CM | POA: Diagnosis not present

## 2015-12-04 DIAGNOSIS — R21 Rash and other nonspecific skin eruption: Secondary | ICD-10-CM

## 2015-12-04 DIAGNOSIS — R739 Hyperglycemia, unspecified: Secondary | ICD-10-CM | POA: Diagnosis present

## 2015-12-04 DIAGNOSIS — E1151 Type 2 diabetes mellitus with diabetic peripheral angiopathy without gangrene: Secondary | ICD-10-CM | POA: Insufficient documentation

## 2015-12-04 DIAGNOSIS — B009 Herpesviral infection, unspecified: Secondary | ICD-10-CM | POA: Diagnosis not present

## 2015-12-04 DIAGNOSIS — E785 Hyperlipidemia, unspecified: Secondary | ICD-10-CM | POA: Insufficient documentation

## 2015-12-04 DIAGNOSIS — L03116 Cellulitis of left lower limb: Principal | ICD-10-CM | POA: Insufficient documentation

## 2015-12-04 DIAGNOSIS — E1140 Type 2 diabetes mellitus with diabetic neuropathy, unspecified: Secondary | ICD-10-CM | POA: Diagnosis not present

## 2015-12-04 DIAGNOSIS — I130 Hypertensive heart and chronic kidney disease with heart failure and stage 1 through stage 4 chronic kidney disease, or unspecified chronic kidney disease: Secondary | ICD-10-CM | POA: Diagnosis not present

## 2015-12-04 DIAGNOSIS — L03119 Cellulitis of unspecified part of limb: Secondary | ICD-10-CM

## 2015-12-04 DIAGNOSIS — N184 Chronic kidney disease, stage 4 (severe): Secondary | ICD-10-CM | POA: Insufficient documentation

## 2015-12-04 DIAGNOSIS — E039 Hypothyroidism, unspecified: Secondary | ICD-10-CM | POA: Diagnosis not present

## 2015-12-04 DIAGNOSIS — F17210 Nicotine dependence, cigarettes, uncomplicated: Secondary | ICD-10-CM | POA: Diagnosis not present

## 2015-12-04 DIAGNOSIS — N179 Acute kidney failure, unspecified: Secondary | ICD-10-CM | POA: Diagnosis not present

## 2015-12-04 DIAGNOSIS — K5900 Constipation, unspecified: Secondary | ICD-10-CM | POA: Diagnosis not present

## 2015-12-04 DIAGNOSIS — D638 Anemia in other chronic diseases classified elsewhere: Secondary | ICD-10-CM | POA: Diagnosis not present

## 2015-12-04 DIAGNOSIS — E118 Type 2 diabetes mellitus with unspecified complications: Secondary | ICD-10-CM

## 2015-12-04 DIAGNOSIS — Z79899 Other long term (current) drug therapy: Secondary | ICD-10-CM | POA: Insufficient documentation

## 2015-12-04 DIAGNOSIS — E871 Hypo-osmolality and hyponatremia: Secondary | ICD-10-CM | POA: Insufficient documentation

## 2015-12-04 DIAGNOSIS — Z794 Long term (current) use of insulin: Secondary | ICD-10-CM | POA: Diagnosis not present

## 2015-12-04 DIAGNOSIS — Z9981 Dependence on supplemental oxygen: Secondary | ICD-10-CM | POA: Insufficient documentation

## 2015-12-04 DIAGNOSIS — L97529 Non-pressure chronic ulcer of other part of left foot with unspecified severity: Secondary | ICD-10-CM | POA: Diagnosis not present

## 2015-12-04 DIAGNOSIS — Z89611 Acquired absence of right leg above knee: Secondary | ICD-10-CM | POA: Diagnosis not present

## 2015-12-04 DIAGNOSIS — L089 Local infection of the skin and subcutaneous tissue, unspecified: Secondary | ICD-10-CM

## 2015-12-04 DIAGNOSIS — N189 Chronic kidney disease, unspecified: Secondary | ICD-10-CM

## 2015-12-04 DIAGNOSIS — E11628 Type 2 diabetes mellitus with other skin complications: Secondary | ICD-10-CM | POA: Diagnosis not present

## 2015-12-04 DIAGNOSIS — G4733 Obstructive sleep apnea (adult) (pediatric): Secondary | ICD-10-CM | POA: Insufficient documentation

## 2015-12-04 DIAGNOSIS — E11621 Type 2 diabetes mellitus with foot ulcer: Secondary | ICD-10-CM | POA: Diagnosis not present

## 2015-12-04 DIAGNOSIS — Z7982 Long term (current) use of aspirin: Secondary | ICD-10-CM | POA: Diagnosis not present

## 2015-12-04 DIAGNOSIS — K219 Gastro-esophageal reflux disease without esophagitis: Secondary | ICD-10-CM | POA: Insufficient documentation

## 2015-12-04 DIAGNOSIS — M069 Rheumatoid arthritis, unspecified: Secondary | ICD-10-CM | POA: Diagnosis not present

## 2015-12-04 DIAGNOSIS — F329 Major depressive disorder, single episode, unspecified: Secondary | ICD-10-CM | POA: Insufficient documentation

## 2015-12-04 DIAGNOSIS — Z89422 Acquired absence of other left toe(s): Secondary | ICD-10-CM | POA: Insufficient documentation

## 2015-12-04 DIAGNOSIS — E1165 Type 2 diabetes mellitus with hyperglycemia: Secondary | ICD-10-CM | POA: Insufficient documentation

## 2015-12-04 DIAGNOSIS — G8929 Other chronic pain: Secondary | ICD-10-CM | POA: Insufficient documentation

## 2015-12-04 DIAGNOSIS — Z9989 Dependence on other enabling machines and devices: Secondary | ICD-10-CM

## 2015-12-04 DIAGNOSIS — L97509 Non-pressure chronic ulcer of other part of unspecified foot with unspecified severity: Secondary | ICD-10-CM

## 2015-12-04 DIAGNOSIS — E1122 Type 2 diabetes mellitus with diabetic chronic kidney disease: Secondary | ICD-10-CM | POA: Insufficient documentation

## 2015-12-04 DIAGNOSIS — Z8614 Personal history of Methicillin resistant Staphylococcus aureus infection: Secondary | ICD-10-CM | POA: Insufficient documentation

## 2015-12-04 DIAGNOSIS — J449 Chronic obstructive pulmonary disease, unspecified: Secondary | ICD-10-CM | POA: Insufficient documentation

## 2015-12-04 DIAGNOSIS — E875 Hyperkalemia: Secondary | ICD-10-CM | POA: Diagnosis not present

## 2015-12-04 DIAGNOSIS — IMO0002 Reserved for concepts with insufficient information to code with codable children: Secondary | ICD-10-CM | POA: Diagnosis present

## 2015-12-04 DIAGNOSIS — E1169 Type 2 diabetes mellitus with other specified complication: Secondary | ICD-10-CM

## 2015-12-04 LAB — COMPREHENSIVE METABOLIC PANEL
ALT: 23 U/L (ref 14–54)
AST: 23 U/L (ref 15–41)
Albumin: 2.2 g/dL — ABNORMAL LOW (ref 3.5–5.0)
Alkaline Phosphatase: 141 U/L — ABNORMAL HIGH (ref 38–126)
Anion gap: 12 (ref 5–15)
BUN: 35 mg/dL — ABNORMAL HIGH (ref 6–20)
CO2: 25 mmol/L (ref 22–32)
Calcium: 8.9 mg/dL (ref 8.9–10.3)
Chloride: 94 mmol/L — ABNORMAL LOW (ref 101–111)
Creatinine, Ser: 1.74 mg/dL — ABNORMAL HIGH (ref 0.44–1.00)
GFR calc Af Amer: 37 mL/min — ABNORMAL LOW (ref >60)
GFR calc non Af Amer: 32 mL/min — ABNORMAL LOW (ref >60)
Glucose, Bld: 283 mg/dL — ABNORMAL HIGH (ref 65–99)
Potassium: 3.9 mmol/L (ref 3.5–5.1)
Sodium: 131 mmol/L — ABNORMAL LOW (ref 135–145)
Total Bilirubin: 0.5 mg/dL (ref 0.3–1.2)
Total Protein: 6.5 g/dL (ref 6.5–8.1)

## 2015-12-04 LAB — CBC WITH DIFFERENTIAL/PLATELET
Basophils Absolute: 0 10*3/uL (ref 0.0–0.1)
Basophils Relative: 0 %
Eosinophils Absolute: 0 10*3/uL (ref 0.0–0.7)
Eosinophils Relative: 0 %
HCT: 33.9 % — ABNORMAL LOW (ref 36.0–46.0)
Hemoglobin: 11.1 g/dL — ABNORMAL LOW (ref 12.0–15.0)
Lymphocytes Relative: 20 %
Lymphs Abs: 2.2 10*3/uL (ref 0.7–4.0)
MCH: 27.5 pg (ref 26.0–34.0)
MCHC: 32.7 g/dL (ref 30.0–36.0)
MCV: 84.1 fL (ref 78.0–100.0)
Monocytes Absolute: 1.1 10*3/uL — ABNORMAL HIGH (ref 0.1–1.0)
Monocytes Relative: 10 %
Neutro Abs: 8 10*3/uL — ABNORMAL HIGH (ref 1.7–7.7)
Neutrophils Relative %: 70 %
Platelets: 201 10*3/uL (ref 150–400)
RBC: 4.03 MIL/uL (ref 3.87–5.11)
RDW: 14.6 % (ref 11.5–15.5)
WBC: 11.4 10*3/uL — ABNORMAL HIGH (ref 4.0–10.5)

## 2015-12-04 LAB — I-STAT CG4 LACTIC ACID, ED: Lactic Acid, Venous: 1.01 mmol/L (ref 0.5–2.0)

## 2015-12-04 MED ORDER — VANCOMYCIN HCL 10 G IV SOLR
1750.0000 mg | Freq: Once | INTRAVENOUS | Status: AC
  Administered 2015-12-04: 1750 mg via INTRAVENOUS
  Filled 2015-12-04: qty 1750

## 2015-12-04 MED ORDER — MORPHINE SULFATE (PF) 4 MG/ML IV SOLN
4.0000 mg | Freq: Once | INTRAVENOUS | Status: AC
Start: 2015-12-04 — End: 2015-12-04
  Administered 2015-12-04: 4 mg via INTRAVENOUS
  Filled 2015-12-04: qty 1

## 2015-12-04 MED ORDER — VANCOMYCIN HCL 10 G IV SOLR
1500.0000 mg | INTRAVENOUS | Status: DC
  Administered 2015-12-05: 1500 mg via INTRAVENOUS
  Filled 2015-12-04 (×2): qty 1500

## 2015-12-04 MED ORDER — SODIUM CHLORIDE 0.9 % IV BOLUS (SEPSIS)
1000.0000 mL | Freq: Once | INTRAVENOUS | Status: AC
  Administered 2015-12-04: 1000 mL via INTRAVENOUS

## 2015-12-04 NOTE — ED Notes (Signed)
Pt states she thinks she has cellulitis in left lower leg. Leg feels hot and is red. Pt states she has had this before.

## 2015-12-04 NOTE — ED Provider Notes (Signed)
CSN: [id number]     Arrival date & time 12/04/15  1549 History   First MD Initiated Contact with Patient 12/04/15 1816     Chief Complaint  Patient presents with  . 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 Presents today with cellulitis to the left lower extremity. Patient reports the pain on her left upper thigh began early yesterday morning when the patient was sleeping. The patient states that she has some decreased sensation to her left lower leg and her mother pointed out the redness when she came to visit her yesterday. The patient states that the redness could have been there longer than yesterday. She describes the pain as stinging, burning. She states her pain and symptoms are similar to her past history of cellulitis. Patient has history of cellulitis in the same area  1-2 years ago. Patient reports that she had fallen previously but had no noticeable cuts. Patient denies any trauma to the area or falls this time. Patient denies any fevers. Patient did have an episode of vomiting and diarrhea yesterday that has resolved. Patient is no longer nauseous or vomiting and had a normal bowel movement today. Patient denies chest pain, abdominal pain, dysuria. Patient has chronic shortness of breath when she is talking due to her COPD. Patient has had a cough for the past week and she has associated arthritis type pain and shoulder and pain in her head when she coughs.  The history is provided by the patient.    Past Medical History  Diagnosis Date  . Diabetic neuropathy (HCC)   . Cellulitis   . Thyroid disease   . Hyperlipidemia   . Neuromuscular disorder (HCC)   . Concussion as a teenager    slight  . Diarrhea     pt takes Reglan tid  . Hypothyroidism     takes Synthroid  daily  . Depression     takes CYmbalta daily  . MRSA (methicillin resistant staph aureus) culture positive     hx of 2012  . Staph infection 2007  . Complication of anesthesia 01/2013    "didn't know where I was; who I was; talking out the top of my head" (14-May-2013)  . Hypertension   . Peripheral vascular disease (HCC)   . CHF (congestive heart failure) (HCC)   . Pneumonia 1980's    "hospitalized w/it" (05/14/13)  . Chronic bronchitis (HCC)   . Orthopnea     "just one; yesterday morning" (05-14-13)  . Type II diabetes mellitus (HCC)     Novolog,Levemir,and Lovaza daily  . GERD (gastroesophageal reflux disease)   . Daily headache   . Rheumatoid arthritis (HCC)     "both knees" (05-14-13)  . Anxiety   . Kidney stone     "passed on before" (14-May-2013)  . Non-healing wound of lower extremity     lt foot  . OSA on CPAP     and oxygen  . COPD (chronic obstructive pulmonary disease) (HCC)     wear oxygen at home  . Symptomatic bradycardia 03/04/2014   Past Surgical History  Procedure Laterality Date  . Amputation    . Leg amputation below knee Right 2012  . Toe amputation Left ?2011     only 2 toes remaning.   . Tubal ligation  1990's  . Foot surgery Right 2012    "  put pins in one year; amputated toes another OR" (04/21/2013)  . Wrist surgery Right 1980's    "pinched nerve" (04/21/2013)  . Dilation and curettage of uterus  1980's  . Refractive surgery Bilateral 2014  . Incision and drainage abscess N/A 01/24/2013    Procedure: INCISION AND DRAINAGE ABSCESS;  Surgeon: Clovis Pu. Cornett, MD;  Location: [facility] OR;  Service: General;  Laterality: N/A;  . Inguinal hernia repair N/A 01/28/2013    Procedure: I&D Right Groin Wound;  Surgeon: Shelly Rubenstein, MD;  Location: [facility] OR;  Service: General;  Laterality: N/A;  . Ganglion cyst excision Left 1990's    "wrist" (04/21/2013)  . Bladder surgery  1970's    "stretched the mouth of my bladder" (04/21/2013)  . I&d extremity Left 08/25/2013     Procedure: IRRIGATION AND DEBRIDEMENT EXTREMITY;  Surgeon: Axel Filler, MD;  Location: [facility] OR;  Service: General;  Laterality: Left;  . Pacemaker insertion  03/05/14    Biotronik dual chamber pacemaker implanted by Dr Graciela Husbands for symptomatic bradycardia  . Permanent pacemaker insertion N/A 03/05/2014    Procedure: PERMANENT PACEMAKER INSERTION;  Surgeon: Duke Salvia, MD;  Location: [facility] CATH LAB;  Service: Cardiovascular;  Laterality: N/A;  . Stump revision Right 12/11/2014    Procedure: Right Below Knee Amputation Revision;  Surgeon: Nadara Mustard, MD;  Location: [facility] OR;  Service: Orthopedics;  Laterality: Right;   Family History  Problem Relation Age of Onset  . Hypertension Mother   . Hyperlipidemia Mother   . Hypertrophic cardiomyopathy Mother     has pacer  . Hypertension Brother   . Hyperlipidemia Brother   . Cancer Maternal Aunt   . Diabetes Paternal Aunt   . Diabetes Paternal Uncle   . Diabetes Paternal Grandmother   . Anesthesia problems Neg Hx   . Hypotension Neg Hx   . Malignant hyperthermia Neg Hx   . Pseudochol deficiency Neg Hx   . Alzheimer's disease Father    Social History  Substance Use Topics  . Smoking status: Current Every Day Smoker -- 1.00 packs/day for 33 years    Types: Cigarettes  . Smokeless tobacco: Never Used  . Alcohol Use: No   OB History    No data available     Review of Systems  Constitutional: Negative for fever and chills.  HENT: Negative for facial swelling and sore throat.   Respiratory: Positive for cough and shortness of breath.   Cardiovascular: Negative for chest pain.  Gastrointestinal: Negative for nausea, vomiting and abdominal pain.  Genitourinary: Negative for dysuria.  Musculoskeletal: Negative for back pain.  Skin: Positive for rash and wound.  Neurological: Negative for headaches.  Psychiatric/Behavioral: The patient is not nervous/anxious.       Allergies  Erythromycin; Iodine-131; Gadolinium; Ivp dye; Metformin;  and Penicillins  Home Medications   Prior to Admission medications   Medication Sig Start Date End Date Taking? Authorizing Provider  fluticasone furoate-vilanterol (BREO ELLIPTA) 100-25 MCG/INH AEPB Inhale 1 puff into the lungs daily as needed (shortness of breath/ wheezing).   Yes Historical Provider, MD  insulin detemir (LEVEMIR) 100 UNIT/ML injection Inject 0.5 mLs (50 Units total) into the skin daily. Patient taking differently: Inject 90 Units into the skin daily.  10/25/15  Yes Rolly Salter, MD  aspirin EC 81 MG tablet Take 81 mg by mouth daily.    Historical Provider, MD  atorvastatin (LIPITOR) 80 MG tablet Take 80 mg by mouth daily.    Historical Provider, MD  carvedilol (COREG) 3.125 MG tablet Take 1 tablet (3.125 mg total) by mouth 2 (two) times daily with a meal. 10/25/15   Rolly Salter, MD  FLUoxetine (PROZAC) 20 MG tablet Take 60 mg by mouth daily. 11/24/15   Historical Provider, MD  furosemide (LASIX) 40 MG tablet Take 0.5 tablets (20 mg total) by mouth daily. 10/25/15   Rolly Salter, MD  gabapentin (NEURONTIN) 300 MG capsule Take 300 mg by mouth at bedtime. 12/01/15   Historical Provider, MD  hydrALAZINE (APRESOLINE) 25 MG tablet Take 1 tablet (25 mg total) by mouth every 8 (eight) hours. 10/25/15   Rolly Salter, MD  insulin aspart (NOVOLOG) 100 UNIT/ML injection Inject 13 Units into the skin 3 (three) times daily with meals. Patient taking differently: Inject 8-28 Units into the skin 4 (four) times daily. Sliding scale 12/14/14   Harold Barban, MD  levothyroxine (SYNTHROID, LEVOTHROID) 25 MCG tablet Take 2 tablets (50 mcg total) by mouth every morning. 10/25/15   Rolly Salter, MD  lisinopril (PRINIVIL,ZESTRIL) 5 MG tablet Take 5 mg by mouth 2 (two) times daily. 12/04/15   Historical Provider, MD  metoCLOPramide (REGLAN) 10 MG tablet Take 10 mg by mouth 4 (four) times daily. Take 1 tablet (10 mg) by mouth 30 minutes before meals and at bedtime 11/25/15   Historical Provider, MD    omeprazole (PRILOSEC) 40 MG capsule Take 40 mg by mouth at bedtime. 11/22/15   Historical Provider, MD  ondansetron (ZOFRAN ODT) 8 MG disintegrating tablet Take 1 tablet (8 mg total) by mouth every 8 (eight) hours as needed for nausea. 09/15/14   Derwood Kaplan, MD  oxybutynin (DITROPAN-XL) 5 MG 24 hr tablet Take 5 mg by mouth daily. 11/22/15   Historical Provider, MD  rosuvastatin (CRESTOR) 20 MG tablet Take 20 mg by mouth daily. 11/22/15   Historical Provider, MD  SSD 1 % cream Apply 1 application topically daily. 11/29/15   Historical Provider, MD   BP 143/58 mmHg  Pulse 70  Temp(Src) 98 F (36.7 C) (Oral)  Resp 15  Ht 5\' 5"  (1.651 m)  Wt 93.123 kg  BMI 34.16 kg/m2  SpO2 96% Physical Exam  Constitutional: She appears well-developed and well-nourished. No distress.  HENT:  Head: Normocephalic and atraumatic.  Eyes: Conjunctivae are normal. Pupils are equal, round, and reactive to light. Right eye exhibits no discharge. Left eye exhibits no discharge. No scleral icterus.  Neck: Normal range of motion. Neck supple. No thyromegaly present.  Cardiovascular: Normal rate, regular rhythm and normal heart sounds.  Exam reveals no gallop and no friction rub.   No murmur heard. Pulmonary/Chest: Effort normal and breath sounds normal. No stridor. No respiratory distress. She has no wheezes. She has no rales.  Abdominal: Soft. Bowel sounds are normal. She exhibits no distension. There is no tenderness. There is no rebound and no guarding.  Musculoskeletal: She exhibits no edema.  Lymphadenopathy:    She has no cervical adenopathy.  Neurological: She is alert. Coordination normal.  Skin: Skin is warm and dry. No rash noted. She is not diaphoretic. No pallor.     Erythema to L lower leg in area demarcated; erythema to lower thigh in area demarcated with overlying papules  Psychiatric: She has a normal mood and affect.  Nursing note and vitals reviewed.   ED Course  Procedures (including critical  care time) Labs Review Labs Reviewed  CBC WITH DIFFERENTIAL/PLATELET - Abnormal; Notable for the following:    WBC 11.4 (*)  Hemoglobin 11.1 (*)    HCT 33.9 (*)    Neutro Abs 8.0 (*)    Monocytes Absolute 1.1 (*)    All other components within normal limits  COMPREHENSIVE METABOLIC PANEL - Abnormal; Notable for the following:    Sodium 131 (*)    Chloride 94 (*)    Glucose, Bld 283 (*)    BUN 35 (*)    Creatinine, Ser 1.74 (*)    Albumin 2.2 (*)    Alkaline Phosphatase 141 (*)    GFR calc non Af Amer 32 (*)    GFR calc Af Amer 37 (*)    All other components within normal limits  CULTURE, BLOOD (ROUTINE X 2)  CULTURE, BLOOD (ROUTINE X 2)  I-STAT CG4 LACTIC ACID, ED    Imaging Review No results found. I have personally reviewed and evaluated these images and lab results as part of my medical decision-making.   EKG Interpretation None      MDM   Patient presentation consistent with past cellulitis symptoms. Afebrile. No tachycardia, hypotension or other symptoms suggestive of severe infection. Elevated WBC, 11.4. HGB 11.1, HCT 33.9. CMP shows hyponatremia at 131, chloride 94, elevated BUN and creatinine, 35 and 1.74 respectively. Glucose 283, albumin 2.2. Elevated alkaline phosphatase 141. Patient has history of similar. CT ordered to rule out necrotizing fascitis. Patient also evaluated by Dr. Dalene Seltzer who will continue evaluation and care at shift change.   Final diagnoses:  Rash       Emi Holes, PA-C 12/04/15 2036  Alvira Monday, MD 12/07/15 [phone]

## 2015-12-04 NOTE — Progress Notes (Signed)
Pharmacy Antibiotic Note  [name] is a 55 y.o. female admitted on 12/04/2015 with cellulitis.  Pharmacy has been consulted for vancomycin dosing.  Plan: -Vancomycin 1750 mg IV x1 then 1g/12h -Monitor renal fx, cultures, duration of therapy   Height: 5\' 5"  (165.1 cm) Weight: 205 lb 4.8 oz (93.123 kg) IBW/kg (Calculated) : 57  Temp (24hrs), Avg:98 F (36.7 C), Min:98 F (36.7 C), Max:98 F (36.7 C)   Recent Labs Lab 12/04/15 1915  WBC 11.4*  CREATININE 1.74*    Estimated Creatinine Clearance: 41.7 mL/min (by C-G formula based on Cr of 1.74).    Allergies  Allergen Reactions  . Erythromycin Diarrhea  . Iodine-131 Nausea And Vomiting  . Gadolinium Nausea And Vomiting     Code: VOM, Desc: Pt began vomiting immed post infusion of multihance, Onset Date: 12/06/15   . Ivp Dye [Iodinated Diagnostic Agents] Nausea And Vomiting       . Metformin Diarrhea  . Penicillins Hives    Has patient had a PCN reaction causing immediate rash, facial/tongue/throat swelling, SOB or lightheadedness with hypotension: No Has patient had a PCN reaction causing severe rash involving mucus membranes or skin necrosis: No Has patient had a PCN reaction that required hospitalization No Has patient had a PCN reaction occurring within the last 10 years: No If all of the above answers are "NO", then may proceed with Cephalosporin use.     Antimicrobials this admission: 4/15 vancomycin >>    Dose adjustments this admission: NA  Microbiology results: 4/15 BCx:  4/15 UCx:    Thank you for allowing pharmacy to be a part of this patient's care.  5/15 12/04/2015 8:09 PM

## 2015-12-05 ENCOUNTER — Encounter (HOSPITAL_COMMUNITY): Payer: Self-pay | Admitting: Family Medicine

## 2015-12-05 DIAGNOSIS — L03119 Cellulitis of unspecified part of limb: Secondary | ICD-10-CM

## 2015-12-05 DIAGNOSIS — J410 Simple chronic bronchitis: Secondary | ICD-10-CM

## 2015-12-05 DIAGNOSIS — L03116 Cellulitis of left lower limb: Secondary | ICD-10-CM | POA: Diagnosis not present

## 2015-12-05 DIAGNOSIS — E11628 Type 2 diabetes mellitus with other skin complications: Secondary | ICD-10-CM | POA: Diagnosis present

## 2015-12-05 DIAGNOSIS — E118 Type 2 diabetes mellitus with unspecified complications: Secondary | ICD-10-CM | POA: Diagnosis not present

## 2015-12-05 DIAGNOSIS — I5032 Chronic diastolic (congestive) heart failure: Secondary | ICD-10-CM

## 2015-12-05 DIAGNOSIS — E1165 Type 2 diabetes mellitus with hyperglycemia: Secondary | ICD-10-CM | POA: Diagnosis not present

## 2015-12-05 LAB — GLUCOSE, CAPILLARY
GLUCOSE-CAPILLARY: 130 mg/dL — AB (ref 65–99)
GLUCOSE-CAPILLARY: 310 mg/dL — AB (ref 65–99)
Glucose-Capillary: 144 mg/dL — ABNORMAL HIGH (ref 65–99)
Glucose-Capillary: 247 mg/dL — ABNORMAL HIGH (ref 65–99)
Glucose-Capillary: 276 mg/dL — ABNORMAL HIGH (ref 65–99)

## 2015-12-05 LAB — URINALYSIS, ROUTINE W REFLEX MICROSCOPIC
Bilirubin Urine: NEGATIVE
GLUCOSE, UA: 100 mg/dL — AB
KETONES UR: NEGATIVE mg/dL
LEUKOCYTES UA: NEGATIVE
Nitrite: NEGATIVE
PH: 5 (ref 5.0–8.0)
Protein, ur: >300 mg/dL — AB
SPECIFIC GRAVITY, URINE: 1.016 (ref 1.005–1.030)

## 2015-12-05 LAB — BASIC METABOLIC PANEL
ANION GAP: 11 (ref 5–15)
BUN: 30 mg/dL — ABNORMAL HIGH (ref 6–20)
CALCIUM: 8.3 mg/dL — AB (ref 8.9–10.3)
CHLORIDE: 99 mmol/L — AB (ref 101–111)
CO2: 24 mmol/L (ref 22–32)
CREATININE: 1.4 mg/dL — AB (ref 0.44–1.00)
GFR calc Af Amer: 48 mL/min — ABNORMAL LOW (ref >60)
GFR calc non Af Amer: 42 mL/min — ABNORMAL LOW (ref >60)
GLUCOSE: 148 mg/dL — AB (ref 65–99)
Potassium: 3.3 mmol/L — ABNORMAL LOW (ref 3.5–5.1)
Sodium: 134 mmol/L — ABNORMAL LOW (ref 135–145)

## 2015-12-05 LAB — URINE MICROSCOPIC-ADD ON

## 2015-12-05 LAB — CBC
HCT: 30.9 % — ABNORMAL LOW (ref 36.0–46.0)
HEMOGLOBIN: 10.1 g/dL — AB (ref 12.0–15.0)
MCH: 27.5 pg (ref 26.0–34.0)
MCHC: 32.7 g/dL (ref 30.0–36.0)
MCV: 84.2 fL (ref 78.0–100.0)
Platelets: 178 10*3/uL (ref 150–400)
RBC: 3.67 MIL/uL — AB (ref 3.87–5.11)
RDW: 14.8 % (ref 11.5–15.5)
WBC: 9.1 10*3/uL (ref 4.0–10.5)

## 2015-12-05 LAB — MRSA PCR SCREENING: MRSA by PCR: NEGATIVE

## 2015-12-05 LAB — C-REACTIVE PROTEIN: CRP: 22.8 mg/dL — AB (ref <1.0)

## 2015-12-05 LAB — SEDIMENTATION RATE

## 2015-12-05 LAB — PREALBUMIN: PREALBUMIN: 10.4 mg/dL — AB (ref 18–38)

## 2015-12-05 LAB — CREATININE, URINE, RANDOM: Creatinine, Urine: 112.64 mg/dL

## 2015-12-05 MED ORDER — HYDRALAZINE HCL 25 MG PO TABS
25.0000 mg | ORAL_TABLET | Freq: Three times a day (TID) | ORAL | Status: DC
  Administered 2015-12-05 – 2015-12-07 (×8): 25 mg via ORAL
  Filled 2015-12-05 (×9): qty 1

## 2015-12-05 MED ORDER — PANTOPRAZOLE SODIUM 40 MG PO TBEC
80.0000 mg | DELAYED_RELEASE_TABLET | Freq: Every day | ORAL | Status: DC
  Administered 2015-12-05 – 2015-12-07 (×3): 80 mg via ORAL
  Filled 2015-12-05 (×3): qty 2

## 2015-12-05 MED ORDER — CARVEDILOL 3.125 MG PO TABS
3.1250 mg | ORAL_TABLET | Freq: Two times a day (BID) | ORAL | Status: DC
  Administered 2015-12-05 – 2015-12-07 (×5): 3.125 mg via ORAL
  Filled 2015-12-05 (×5): qty 1

## 2015-12-05 MED ORDER — LEVOTHYROXINE SODIUM 50 MCG PO TABS
50.0000 ug | ORAL_TABLET | ORAL | Status: DC
  Administered 2015-12-05 – 2015-12-07 (×3): 50 ug via ORAL
  Filled 2015-12-05 (×3): qty 1

## 2015-12-05 MED ORDER — HYDROCODONE-ACETAMINOPHEN 5-325 MG PO TABS
1.0000 | ORAL_TABLET | ORAL | Status: DC | PRN
  Administered 2015-12-05 – 2015-12-07 (×7): 2 via ORAL
  Filled 2015-12-05 (×8): qty 2

## 2015-12-05 MED ORDER — GABAPENTIN 300 MG PO CAPS
300.0000 mg | ORAL_CAPSULE | Freq: Every day | ORAL | Status: DC
  Administered 2015-12-05: 300 mg via ORAL
  Filled 2015-12-05: qty 1

## 2015-12-05 MED ORDER — ONDANSETRON HCL 4 MG/2ML IJ SOLN
4.0000 mg | Freq: Four times a day (QID) | INTRAMUSCULAR | Status: DC | PRN
  Administered 2015-12-05: 4 mg via INTRAVENOUS
  Filled 2015-12-05: qty 2

## 2015-12-05 MED ORDER — SODIUM CHLORIDE 0.9 % IV SOLN
INTRAVENOUS | Status: DC
  Administered 2015-12-05: 02:00:00 via INTRAVENOUS

## 2015-12-05 MED ORDER — INSULIN DETEMIR 100 UNIT/ML ~~LOC~~ SOLN
50.0000 [IU] | Freq: Two times a day (BID) | SUBCUTANEOUS | Status: DC
  Administered 2015-12-05: 50 [IU] via SUBCUTANEOUS
  Filled 2015-12-05 (×3): qty 0.5

## 2015-12-05 MED ORDER — METRONIDAZOLE 500 MG PO TABS
500.0000 mg | ORAL_TABLET | Freq: Three times a day (TID) | ORAL | Status: DC
  Administered 2015-12-05 – 2015-12-06 (×5): 500 mg via ORAL
  Filled 2015-12-05 (×5): qty 1

## 2015-12-05 MED ORDER — ACETAMINOPHEN 650 MG RE SUPP: 650.0000 mg | Freq: Four times a day (QID) | RECTAL | Status: DC | PRN

## 2015-12-05 MED ORDER — SODIUM CHLORIDE 0.9 % IV SOLN
INTRAVENOUS | Status: DC
  Administered 2015-12-05: 13:00:00 via INTRAVENOUS
  Administered 2015-12-06: 1 mL via INTRAVENOUS

## 2015-12-05 MED ORDER — INSULIN ASPART 100 UNIT/ML ~~LOC~~ SOLN
0.0000 [IU] | Freq: Three times a day (TID) | SUBCUTANEOUS | Status: DC
  Administered 2015-12-05: 11 [IU] via SUBCUTANEOUS
  Administered 2015-12-05: 5 [IU] via SUBCUTANEOUS
  Administered 2015-12-05: 2 [IU] via SUBCUTANEOUS
  Administered 2015-12-06: 5 [IU] via SUBCUTANEOUS
  Administered 2015-12-06: 8 [IU] via SUBCUTANEOUS
  Administered 2015-12-06 – 2015-12-07 (×2): 3 [IU] via SUBCUTANEOUS

## 2015-12-05 MED ORDER — ATORVASTATIN CALCIUM 80 MG PO TABS
80.0000 mg | ORAL_TABLET | Freq: Every day | ORAL | Status: DC
  Administered 2015-12-05 – 2015-12-06 (×2): 80 mg via ORAL
  Filled 2015-12-05 (×2): qty 1

## 2015-12-05 MED ORDER — SENNOSIDES-DOCUSATE SODIUM 8.6-50 MG PO TABS: 1.0000 | ORAL_TABLET | Freq: Every evening | ORAL | Status: DC | PRN

## 2015-12-05 MED ORDER — INSULIN DETEMIR 100 UNIT/ML ~~LOC~~ SOLN
45.0000 [IU] | Freq: Two times a day (BID) | SUBCUTANEOUS | Status: DC
  Administered 2015-12-05: 45 [IU] via SUBCUTANEOUS
  Filled 2015-12-05 (×2): qty 0.45

## 2015-12-05 MED ORDER — POTASSIUM CHLORIDE CRYS ER 20 MEQ PO TBCR
40.0000 meq | EXTENDED_RELEASE_TABLET | ORAL | Status: AC
  Administered 2015-12-05 (×2): 40 meq via ORAL
  Filled 2015-12-05 (×2): qty 2

## 2015-12-05 MED ORDER — ONDANSETRON HCL 4 MG PO TABS: 4.0000 mg | ORAL_TABLET | Freq: Four times a day (QID) | ORAL | Status: DC | PRN

## 2015-12-05 MED ORDER — DEXTROSE 5 % IV SOLN
2.0000 g | INTRAVENOUS | Status: DC
  Administered 2015-12-05 – 2015-12-06 (×2): 2 g via INTRAVENOUS
  Filled 2015-12-05 (×2): qty 2

## 2015-12-05 MED ORDER — ENOXAPARIN SODIUM 40 MG/0.4ML ~~LOC~~ SOLN
40.0000 mg | SUBCUTANEOUS | Status: DC
  Administered 2015-12-05 – 2015-12-07 (×3): 40 mg via SUBCUTANEOUS
  Filled 2015-12-05 (×3): qty 0.4

## 2015-12-05 MED ORDER — INSULIN ASPART 100 UNIT/ML ~~LOC~~ SOLN
0.0000 [IU] | Freq: Every day | SUBCUTANEOUS | Status: DC
  Administered 2015-12-05: 3 [IU] via SUBCUTANEOUS

## 2015-12-05 MED ORDER — FLUTICASONE FUROATE-VILANTEROL 100-25 MCG/INH IN AEPB: 1.0000 | INHALATION_SPRAY | Freq: Every day | RESPIRATORY_TRACT | Status: DC | PRN

## 2015-12-05 MED ORDER — FLUOXETINE HCL 20 MG PO CAPS
60.0000 mg | ORAL_CAPSULE | Freq: Every day | ORAL | Status: DC
  Administered 2015-12-05 – 2015-12-07 (×3): 60 mg via ORAL
  Filled 2015-12-05 (×4): qty 3

## 2015-12-05 MED ORDER — ACETAMINOPHEN 325 MG PO TABS
650.0000 mg | ORAL_TABLET | Freq: Four times a day (QID) | ORAL | Status: DC | PRN
  Administered 2015-12-05: 650 mg via ORAL
  Filled 2015-12-05: qty 2

## 2015-12-05 MED ORDER — ASPIRIN EC 81 MG PO TBEC
81.0000 mg | DELAYED_RELEASE_TABLET | Freq: Every day | ORAL | Status: DC
Start: 2015-12-05 — End: 2015-12-07
  Administered 2015-12-05 – 2015-12-07 (×3): 81 mg via ORAL
  Filled 2015-12-05 (×3): qty 1

## 2015-12-05 MED ORDER — OXYBUTYNIN CHLORIDE ER 5 MG PO TB24
5.0000 mg | ORAL_TABLET | Freq: Every day | ORAL | Status: DC
  Administered 2015-12-05 – 2015-12-07 (×3): 5 mg via ORAL
  Filled 2015-12-05 (×3): qty 1

## 2015-12-05 NOTE — Evaluation (Addendum)
Physical Therapy Evaluation and Discharge Patient Details Name: [name] MRN: [id number] DOB: Sep 07, 1960 Today's Date: 12/05/2015   History of Present Illness  55 y.o. female with a past medical history significant for Right AKA, IDDM, hypothyroidism, HTN, chronic diastolic CHF EF 55%, OSA not on CPAP, and COPD not on home O2 anymore who presents with leg redness and pain  Clinical Impression  Patient seen for evaluation in conjunction with OT. Patient performed surprisingly well with mobility despite medical co-morbidities and pain in LLE. Patient utilized w/c for mobility at home the majority of the time, but is able to maneuver short distance with use of guard rails. At this time, patient is at her baseline for mobility and demonstrates no further acute PT needs, will sign off.     Follow Up Recommendations No PT follow up    Equipment Recommendations  None recommended by PT    Recommendations for Other Services       Precautions / Restrictions Precautions Precautions: Fall Required Braces or Orthoses: Other Brace/Splint (RLE prosthesis)      Mobility  Bed Mobility Overal bed mobility: Modified Independent             General bed mobility comments: increased effort, no physical assist required  Transfers Overall transfer level: Modified independent Equipment used: None             General transfer comment: Able to transfer from bed to bedside commode without difficulty, able to stand from bedside commode without assist  Ambulation/Gait Ambulation/Gait assistance: Modified independent (Device/Increase time) Ambulation Distance (Feet): 3 Feet Assistive device: None Gait Pattern/deviations: Step-to pattern Gait velocity: decreased   General Gait Details: steps to chair, no assitive device utilized  Information systems manager [place] (Stroke Patients Only)       Balance Overall balance assessment: No apparent balance  deficits (not formally assessed)   Sitting balance-[name]: Good       Standing balance-[name]: Fair Standing balance comment: able to stand on LE and prosthesis without assist                             Pertinent Vitals/Pain Pain Assessment: Faces Faces Pain Scale: Hurts little more Pain Location: left leg Pain Descriptors / Indicators: Sore Pain Intervention(s): Limited activity within patient's tolerance;Monitored during session;Repositioned    Home Living Family/patient expects to be discharged to:: Private residence Living Arrangements: Alone Available Help at Discharge: Personal care attendant;Available PRN/intermittently Type of Home: Apartment Home Access: Stairs to enter Entrance Stairs-Rails: Right;Left Entrance Stairs-Number of Steps: 2 at sidewalk with  bilateral rails and one to get in Home Layout: One level Home Equipment: Grab bars - tub/shower Additional Comments: toilet aid    Prior Function Level of Independence: Independent with assistive device(s);Needs assistance   Gait / Transfers Assistance Needed: uses grab bars to ambulate into bathroom from w/c  ADL's / Homemaking Assistance Needed: independent with dressing, bathing. has assist for household chores  Comments: uses w/c mainly, walks short distances, negotiates steps at entrance     Hand Dominance   Dominant Hand: Left    Extremity/Trunk Assessment   Upper Extremity Assessment: Overall WFL for tasks assessed           Lower Extremity Assessment: RLE deficits/detail;LLE deficits/detail RLE Deficits / Details: R AKA LLE Deficits / Details: left foor ulcer, increased erythema and  swelling noted.      Communication   Communication: No difficulties  Cognition Arousal/Alertness: Awake/alert Behavior During Therapy: WFL for tasks assessed/performed Overall Cognitive Status: Within Functional Limits for tasks assessed                      General Comments       Exercises        Assessment/Plan    PT Assessment Patent does not need any further PT services  PT Diagnosis Acute pain   PT Problem List    PT Treatment Interventions     PT Goals (Current goals can be found in the Care Plan section) Acute Rehab PT Goals Patient Stated Goal: to go home PT Goal Formulation: All assessment and education complete, DC therapy    Frequency     Barriers to discharge        Co-evaluation PT/OT/SLP Co-Evaluation/Treatment: Yes Reason for Co-Treatment: Complexity of the patient's impairments (multi-system involvement);For patient/therapist safety PT goals addressed during session: Mobility/safety with mobility         End of Session   Activity Tolerance: Patient tolerated treatment well Patient left: in chair;with call bell/phone within reach Nurse Communication: Mobility status         Time: 6629-4765 PT Time Calculation (min) (ACUTE ONLY): 13 min   Charges:   PT Evaluation $PT Eval Low Complexity: 1 Procedure     PT G Codes:   PT G-Codes **NOT FOR INPATIENT CLASS** Functional Assessment Tool Used: clinical judgement Functional Limitation: Mobility: Walking and moving around Mobility: Walking and Moving Around Current Status ([id number]): At least 1 percent but less than 20 percent impaired, limited or restricted Mobility: Walking and Moving Around Goal Status [phone]): At least 1 percent but less than 20 percent impaired, limited or restricted Mobility: Walking and Moving Around Discharge Status [phone]): At least 1 percent but less than 20 percent impaired, limited or restricted    Fabio Asa 12/05/2015, 9:47 AM Charlotte Crumb, PT DPT  [phone]

## 2015-12-05 NOTE — Progress Notes (Signed)
PROGRESS NOTE                                                                                                                                                                                                             Patient Demographics:    Bryonna Mowdy, is a 55 y.o. female, DOB - 1961/06/12, PNP:[id number]  Admit date - 12/04/2015   Admitting Physician Alberteen Sam, MD  Outpatient Primary MD for the patient is TALBOT, Dineen Kid, MD  LOS -   Outpatient Specialists: Ortho Dr Lajoyce Corners  Chief Complaint  Patient presents with  . Cellulitis       Brief Narrative     Subjective:    Valincia Schall today has, No headache, No chest pain, No abdominal pain - No Nausea, No new weakness tingling or numbness, No Cough - SOB. Mild L Leg redness   Assessment  & Plan :     1.Left leg cellulitis with chronic left diabetic foot ulcer as below. I do not think this ulcer is infected, she follows with Dr. Lajoyce Corners for the same, continue empiric antibiotics for cellulitis. She has clinically improved. We will likely place her on oral antibiotics tomorrow for another 7 days and discharged home with orthopedic follow-up.  2. Chronic diastolic dysfunction EF 60%. Completely compensated.  3. Dyslipidemia. Continue home dose statin  4. Hypothyroidism. On Synthroid continue.  5. Anemia of chronic disease. Supportive care. No need for transfusion.  6. GERD. On PPI.  7. Hypertension. On Coreg stable.  8. History of Charcot foot with diabetic neuropathy. Supportive care.   9. OSA. CPAP daily at bedtime.   10. Underlying COPD. Stable. No wheezing.   11. Ongoing smoking. Counseled to quit.   12. DM type II. On Levimir 45 units along with sliding scale. Monitor. Poor outpatient control. We'll increase Levemir to 50 twice a day.  Lab Results  Component Value Date   HGBA1C 11.9* 10/20/2015   CBG (last 3)   Recent Labs  12/05/15 0144 12/05/15 0740 12/05/15 1153  GLUCAP 130* 144* 247*       Code Status : Full  Family Communication  : None  Disposition Plan  : Home 1-2 days  Barriers For Discharge : Left foot and leg infection  Consults  :  None  Procedures  :  CT soft tissue left  leg confirming cellulitis  DVT Prophylaxis  :  Lovenox    Lab Results  Component Value Date   PLT 178 12/05/2015    Antibiotics  :     Anti-infectives    Start     Dose/Rate Route Frequency Ordered Stop   12/05/15 2100  vancomycin (VANCOCIN) 1,500 mg in sodium chloride 0.9 % 500 mL IVPB     1,500 mg 250 mL/hr over 120 Minutes Intravenous Every 24 hours 12/04/15 2009     12/05/15 0130  metroNIDAZOLE (FLAGYL) tablet 500 mg     500 mg Oral 3 times per day 12/05/15 0127     12/05/15 0127  cefTRIAXone (ROCEPHIN) 2 g in dextrose 5 % 50 mL IVPB     2 g 100 mL/hr over 30 Minutes Intravenous Every 24 hours 12/05/15 0127     12/04/15 2030  vancomycin (VANCOCIN) 1,750 mg in sodium chloride 0.9 % 500 mL IVPB     1,750 mg 250 mL/hr over 120 Minutes Intravenous  Once 12/04/15 2008 12/05/15 0700        Objective:   Filed Vitals:   12/05/15 0100 12/05/15 0108 12/05/15 0135 12/05/15 0507  BP: 167/62  140/52 108/40  Pulse: 83  82 71  Temp:  98.8 F (37.1 C) 100.6 F (38.1 C) 98.4 F (36.9 C)  TempSrc:  Oral Oral Oral  Resp: 24  20 20   Height:   5\' 5"  (1.651 m)   Weight:   96.616 kg (213 lb)   SpO2: 94%  95% 96%    Wt Readings from Last 3 Encounters:  12/05/15 96.616 kg (213 lb)  10/24/15 91.2 kg (201 lb 1 oz)  07/09/15 98.884 kg (218 lb)     Intake/Output Summary (Last 24 hours) at 12/05/15 1236 Last data filed at 12/05/15 0934  Gross per 24 hour  Intake 1523.33 ml  Output    450 ml  Net 1073.33 ml     Physical Exam  Awake Alert, Oriented X 3, No new F.N deficits, Normal affect [place].AT,PERRAL Supple Neck,No JVD, No cervical lymphadenopathy appriciated.  Symmetrical Chest wall movement, Good  air movement bilaterally, CTAB RRR,No Gallops,Rubs or new Murmurs, No Parasternal Heave +ve B.Sounds, Abd Soft, No tenderness, No organomegaly appriciated, No rebound - guarding or rigidity. No Cyanosis, Clubbing or edema, No new Rash or bruise , R BKA  L foot     Outer left thigh:      Data Review:    CBC  Recent Labs Lab 12/04/15 1915 12/05/15 0337  WBC 11.4* 9.1  HGB 11.1* 10.1*  HCT 33.9* 30.9*  PLT 201 178  MCV 84.1 84.2  MCH 27.5 27.5  MCHC 32.7 32.7  RDW 14.6 14.8  LYMPHSABS 2.2  --   MONOABS 1.1*  --   EOSABS 0.0  --   BASOSABS 0.0  --     Chemistries   Recent Labs Lab 12/04/15 1915 12/05/15 0337  NA 131* 134*  K 3.9 3.3*  CL 94* 99*  CO2 25 24  GLUCOSE 283* 148*  BUN 35* 30*  CREATININE 1.74* 1.40*  CALCIUM 8.9 8.3*  AST 23  --   ALT 23  --   ALKPHOS 141*  --   BILITOT 0.5  --    ------------------------------------------------------------------------------------------------------------------ No results for input(s): CHOL, HDL, LDLCALC, TRIG, CHOLHDL, LDLDIRECT in the last 72 hours.  Lab Results  Component Value Date   HGBA1C 11.9* 10/20/2015   ------------------------------------------------------------------------------------------------------------------ No results for input(s): TSH, T4TOTAL, T3FREE, THYROIDAB in  the last 72 hours.  Invalid input(s): FREET3 ------------------------------------------------------------------------------------------------------------------ No results for input(s): VITAMINB12, FOLATE, FERRITIN, TIBC, IRON, RETICCTPCT in the last 72 hours.  Coagulation profile No results for input(s): INR, PROTIME in the last 168 hours.  No results for input(s): DDIMER in the last 72 hours.  Cardiac Enzymes No results for input(s): CKMB, TROPONINI, MYOGLOBIN in the last 168 hours.  Invalid input(s):  CK ------------------------------------------------------------------------------------------------------------------ No results found for: BNP  Inpatient Medications  Scheduled Meds: . aspirin EC  81 mg Oral Daily  . atorvastatin  80 mg Oral q1800  . carvedilol  3.125 mg Oral BID WC  . cefTRIAXone (ROCEPHIN)  IV  2 g Intravenous Q24H   And  . metroNIDAZOLE  500 mg Oral 3 times per day  . enoxaparin (LOVENOX) injection  40 mg Subcutaneous Q24H  . FLUoxetine  60 mg Oral Daily  . gabapentin  300 mg Oral QHS  . hydrALAZINE  25 mg Oral 3 times per day  . insulin aspart  0-15 Units Subcutaneous TID WC  . insulin aspart  0-5 Units Subcutaneous QHS  . insulin detemir  45 Units Subcutaneous BID  . levothyroxine  50 mcg Oral BH-q7a  . oxybutynin  5 mg Oral Daily  . pantoprazole  80 mg Oral Daily  . potassium chloride  40 mEq Oral Q4H  . vancomycin  1,500 mg Intravenous Q24H   Continuous Infusions: . sodium chloride 75 mL/hr at 12/05/15 0850   PRN Meds:.acetaminophen **OR** [DISCONTINUED] acetaminophen, HYDROcodone-acetaminophen, [DISCONTINUED] ondansetron **OR** ondansetron (ZOFRAN) IV, senna-docusate  Micro Results Recent Results (from the past 240 hour(s))  MRSA PCR Screening     Status: None   Collection Time: 12/05/15  9:41 AM  Result Value Ref Range Status   MRSA by PCR NEGATIVE NEGATIVE Final    Comment:        The GeneXpert MRSA Assay (FDA approved for NASAL specimens only), is one component of a comprehensive MRSA colonization surveillance program. It is not intended to diagnose MRSA infection nor to guide or monitor treatment for MRSA infections.     Radiology Reports Ct Femur Left Wo Contrast  12/04/2015  CLINICAL DATA:  Distal left femoral rash. Nonhealing wound left foot. EXAM: CT OF THE LEFT FEMUR WITHOUT CONTRAST TECHNIQUE: Multidetector CT imaging was performed according to the standard protocol. Multiplanar CT image reconstructions were also generated.  COMPARISON:  None. FINDINGS: There is subcutaneous edema and mild skin thickening involving the lateral aspect of the distal left thigh. The abnormality is confined to the skin and subcutaneous tissues. There is no intramuscular collection or inflammation. There is no soft tissue gas. There is no drainable fluid collection. A few reactive appearing nodes are present of the left inguinal region, also surrounded by mild subcutaneous stranding. No bony abnormality. IMPRESSION: The findings are most consistent with cellulitis. No soft tissue gas. No abscess. No intramuscular or bony abnormality. Electronically Signed   By: Ellery Plunk M.D.   On: 12/04/2015 21:56    Time Spent in minutes  25   SINGH,PRASHANT K M.D on 12/05/2015 at 12:36 PM  Between 7am to 7pm - Pager - [phone]  After 7pm go to [email or web address] - password [facility]  [facility] -  Office  [phone]

## 2015-12-05 NOTE — Evaluation (Addendum)
Occupational Therapy Evaluation and Discharge Patient Details Name: [name] MRN: [id number] DOB: Aug 17, 1961 Today's Date: 12/05/2015    History of Present Illness 56 y.o. female with a past medical history significant for Right AKA, IDDM, hypothyroidism, HTN, chronic diastolic CHF EF 55%, OSA not on CPAP, and COPD not on home O2 anymore who presents with leg redness and pain   Clinical Impression   Pt reports she was performing ADLs and mobility with mod I PTA. Currently pt is at baseline level; able to demo ADLs and functional mobility with mod I. Pt has a home aide that assist with household chores daily. No further acute OT needs identified; signing off at this time. Please re-consult if needs change. Thank you for this referral.    Follow Up Recommendations  No OT follow up;Supervision - Intermittent    Equipment Recommendations  None recommended by OT    Recommendations for Other Services       Precautions / Restrictions Precautions Precautions: Fall Required Braces or Orthoses: Other Brace/Splint (RLE prosthesis) Restrictions Weight Bearing Restrictions: No      Mobility Bed Mobility Overal bed mobility: Modified Independent             General bed mobility comments: increased effort, no physical assist required  Transfers Overall transfer level: Modified independent Equipment used: None             General transfer comment: Able to transfer from bed to bedside commode without difficulty, able to stand from bedside commode without assist    Balance Overall balance assessment: No apparent balance deficits (not formally assessed)   Sitting balance-[name]: Good       Standing balance-[name]: Fair Standing balance comment: able to stand on LE and prosthesis without assist                            ADL Overall ADL's : Modified independent;At baseline                                     Functional  mobility during ADLs: Modified independent (for short distance mobility) General ADL Comments: Pt reports she feels she is functioning at baseline level with ADLs and functional mobility currently. Able to perform LB dressing (sock and prosthesis), stand pivot transfer to [facility], toilet hygiene with mod I.     Vision Vision Assessment?: No apparent visual deficits   Perception     Praxis      Pertinent Vitals/Pain Pain Assessment: Faces Faces Pain Scale: Hurts little more Pain Location: L leg Pain Descriptors / Indicators: Sore Pain Intervention(s): Limited activity within patient's tolerance;Monitored during session;Repositioned     Hand Dominance Left   Extremity/Trunk Assessment Upper Extremity Assessment Upper Extremity Assessment: Overall WFL for tasks assessed   Lower Extremity Assessment Lower Extremity Assessment: Defer to PT evaluation        Communication Communication Communication: No difficulties   Cognition Arousal/Alertness: Awake/alert Behavior During Therapy: WFL for tasks assessed/performed Overall Cognitive Status: Within Functional Limits for tasks assessed                     General Comments       Exercises       Shoulder Instructions      Home Living Family/patient expects to be discharged to:: Private residence Living Arrangements: Alone Available Help at  Discharge: Personal care attendant;Available PRN/intermittently Type of Home: Apartment Home Access: Stairs to enter Entrance Stairs-Number of Steps: 2 at sidewalk with  bilateral rails and one to get in Entrance Stairs-Rails: Right;Left Home Layout: One level     Bathroom Shower/Tub: Estate manager/land agent Accessibility: No   Home Equipment: Grab bars - tub/shower;Bedside commode;Shower seat   Additional Comments: toilet aid      Prior Functioning/Environment Level of Independence: Independent with assistive device(s);Needs assistance  Gait / Transfers  Assistance Needed: uses grab bars to ambulate into bathroom from w/c ADL's / Homemaking Assistance Needed: independent with dressing, bathing. has assist for household chores   Comments: uses w/c mainly, walks short distances, negotiates steps at entrance    OT Diagnosis: Acute pain;Generalized weakness   OT Problem List:     OT Treatment/Interventions:      OT Goals(Current goals can be found in the care plan section) Acute Rehab OT Goals Patient Stated Goal: to go home OT Goal Formulation: All assessment and education complete, DC therapy  OT Frequency:     Barriers to D/C:            Co-evaluation PT/OT/SLP Co-Evaluation/Treatment: Yes Reason for Co-Treatment: Complexity of the patient's impairments (multi-system involvement);For patient/therapist safety PT goals addressed during session: Mobility/safety with mobility OT goals addressed during session: ADL's and self-care;Other (comment) (mobility)      End of Session Equipment Utilized During Treatment: Other (comment) (RLE prosthesis)  Activity Tolerance: Patient tolerated treatment well Patient left: in chair;with call bell/phone within reach;Other (comment) (with MD)   Time: 7121-9758 OT Time Calculation (min): 13 min Charges:  OT General Charges $OT Visit: 1 Procedure OT Evaluation $OT Eval Low Complexity: 1 Procedure G-Codes: OT G-codes **NOT FOR INPATIENT CLASS** Functional Assessment Tool Used: Clinical judgement Functional Limitation: Self care Self Care Current Status (I3254): At least 1 percent but less than 20 percent impaired, limited or restricted Self Care Goal Status (D8264): At least 1 percent but less than 20 percent impaired, limited or restricted Self Care Discharge Status [phone]): At least 1 percent but less than 20 percent impaired, limited or restricted   Gaye Alken M.S., OTR/L Pager: [phone]  12/05/2015, 10:03 AM

## 2015-12-05 NOTE — H&P (Signed)
History and Physical  Patient Name: [name]     BMW:[id number]    DOB: 10/28/1960    DOA: 12/04/2015 Referring provider: Gareth Morgan, MD PCP: Marijean Bravo, MD  Outpatient specialists: Virl Axe, Cardiology,  Patient coming from: Home  Chief Complaint: Redness and swelling of the left leg  HPI: [name] is a 55 y.o. female with a past medical history significant for IDDM, hypothyroidism, HTN, chronic diastolic CHF EF 27%, OSA not on CPAP, and COPD not on home O2 anymore who presents with leg redness and pain, found to have elevated creatinine incidentally.  The patient was in her usual state of health until about 2 days ago when she started to develop a patch of redness and pain on her left leg. This is in 2 places 1 on the outer left thigh, and the other circumferentially on the left calf. She has a chronic ulcer the base of the left foot, which she doesn't think has changed much. She's had some malaise, chills today, and vomiting all day yesterday and diarrhea, which she attributed to "stomach flu". Today the redness was worse and pain is worse and so she came to get "checked out".  In the ED, she was afebrile, hemodynamically stable without tachycardia.  Na 131, glucose 283, Cr 1.74 from baseline 0.9.  WBC 11.4K, Hgb 11.1 stable from baseline. Lactate normal.  Blood cultures were obtained and fluid and vancomycin were administered.  TRH were asked to evaluate for admission.    She was recently admitted in Feb for HHS and encephalopathy requiring intubation for a period of time.  She only uses O2 at home for her COPD sometimes, when traveling outside the home.  She does not use CPAP because not compliant.  She has used her insulin as usual this week.     Review of Systems:  All other systems negative except as just noted or noted in the history of present illness.    Past Medical History  Diagnosis Date  . Diabetic neuropathy ([place])   . Cellulitis   . Thyroid  disease   . Hyperlipidemia   . Neuromuscular disorder ([place])   . Concussion as a teenager    slight  . Diarrhea     pt takes Reglan tid  . Hypothyroidism     takes Synthroid daily  . Depression     takes CYmbalta daily  . MRSA (methicillin resistant staph aureus) culture positive     hx of 2012  . Staph infection 2007  . Complication of anesthesia 01/2013    "didn't know where I was; who I was; talking out the top of my head" (05-02-2013)  . Hypertension   . Peripheral vascular disease ([place])   . CHF (congestive heart failure) ([place])   . Pneumonia 1980's    "hospitalized w/it" (May 02, 2013)  . Chronic bronchitis ([place])   . Orthopnea     "just one; yesterday morning" (May 02, 2013)  . Type II diabetes mellitus ([facility])     Novolog,Levemir,and Lovaza daily  . GERD (gastroesophageal reflux disease)   . Daily headache   . Rheumatoid arthritis ([place])     "both knees" (05-02-13)  . Anxiety   . Kidney stone     "passed on before" (May 02, 2013)  . Non-healing wound of lower extremity     lt foot  . OSA on CPAP     and oxygen  . COPD (chronic obstructive pulmonary disease) ([facility])     wear oxygen at home  .  Symptomatic bradycardia 03/04/2014    Past Surgical History  Procedure Laterality Date  . Amputation    . Leg amputation below knee Right 2012  . Toe amputation Left ?2011     only 2 toes remaning.   . Tubal ligation  1990's  . Foot surgery Right 2012    "put pins in one year; amputated toes another OR" (04/21/2013)  . Wrist surgery Right 1980's    "pinched nerve" (04/21/2013)  . Dilation and curettage of uterus  1980's  . Refractive surgery Bilateral 2014  . Incision and drainage abscess N/A 01/24/2013    Procedure: INCISION AND DRAINAGE ABSCESS;  Surgeon: Joyice Faster. Cornett, MD;  Location: [facility];  Service: General;  Laterality: N/A;  . Inguinal hernia repair N/A 01/28/2013    Procedure: I&D Right Groin Wound;  Surgeon: Harl Bowie, MD;  Location: [facility];  Service: General;  Laterality:  N/A;  . Ganglion cyst excision Left 1990's    "wrist" (04/21/2013)  . Bladder surgery  1970's    "stretched the mouth of my bladder" (04/21/2013)  . I&d extremity Left 08/25/2013    Procedure: IRRIGATION AND DEBRIDEMENT EXTREMITY;  Surgeon: Ralene Ok, MD;  Location: [facility];  Service: General;  Laterality: Left;  . Pacemaker insertion  03/05/14    Biotronik dual chamber pacemaker implanted by Dr Caryl Comes for symptomatic bradycardia  . Permanent pacemaker insertion N/A 03/05/2014    Procedure: PERMANENT PACEMAKER INSERTION;  Surgeon: Deboraha Sprang, MD;  Location: [facility] C[facility] LAB;  Service: Cardiovascular;  Laterality: N/A;  . Stump revision Right 12/11/2014    Procedure: Right Below Knee Amputation Revision;  Surgeon: Newt Minion, MD;  Location: [facility];  Service: Orthopedics;  Laterality: Right;    Social History: Patient lives by herself.  Her mother lives in [place].  She smokes. 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  She is from [place] originally.  She has no dementia.    Allergies  Allergen Reactions  . Erythromycin Diarrhea  . Iodine-131 Nausea And Vomiting  . Gadolinium Nausea And Vomiting     Code: VOM, Desc: Pt began vomiting immed post infusion of multihance, Onset Date: [id number]   . Ivp Dye [Iodinated Diagnostic Agents] Nausea And Vomiting       . Metformin Diarrhea  . Penicillins Hives    Has patient had a PCN reaction causing immediate rash, facial/tongue/throat swelling, SOB or lightheadedness with hypotension: No Has patient had a PCN reaction causing severe rash involving mucus membranes or skin necrosis: No Has patient had a PCN reaction that required hospitalization No Has patient had a PCN reaction occurring within the last 10 years: No If all of the above answers are "NO", then may proceed with Cephalosporin use.     Family history: family history includes Alzheimer's disease in her father; Cancer in her maternal aunt; Diabetes  in her paternal aunt, paternal grandmother, and paternal uncle; Hyperlipidemia in her brother and mother; Hypertension in her brother and mother; Hypertrophic cardiomyopathy in her mother. There is no history of Anesthesia problems, Hypotension, Malignant hyperthermia, or Pseudochol deficiency.  Prior to Admission medications   Medication Sig Start Date End Date Taking? Authorizing Provider  fluticasone furoate-vilanterol (BREO ELLIPTA) 100-25 MCG/INH AEPB Inhale 1 puff into the lungs daily as needed (shortness of breath/ wheezing).   Yes Historical Provider, MD  insulin detemir (LEVEMIR) 100 UNIT/ML injection Inject 0.5 mLs (50 Units total) into the skin daily. Patient taking  differently: Inject 90 Units into the skin daily.  10/25/15  Yes Lavina Hamman, MD  aspirin EC 81 MG tablet Take 81 mg by mouth daily.    Historical Provider, MD  atorvastatin (LIPITOR) 80 MG tablet Take 80 mg by mouth daily.    Historical Provider, MD  carvedilol (COREG) 3.125 MG tablet Take 1 tablet (3.125 mg total) by mouth 2 (two) times daily with a meal. 10/25/15   Lavina Hamman, MD  FLUoxetine (PROZAC) 20 MG tablet Take 60 mg by mouth daily. 11/24/15   Historical Provider, MD  furosemide (LASIX) 40 MG tablet Take 0.5 tablets (20 mg total) by mouth daily. 10/25/15   Lavina Hamman, MD  gabapentin (NEURONTIN) 300 MG capsule Take 300 mg by mouth at bedtime. 12/01/15   Historical Provider, MD  hydrALAZINE (APRESOLINE) 25 MG tablet Take 1 tablet (25 mg total) by mouth every 8 (eight) hours. 10/25/15   Lavina Hamman, MD  insulin aspart (NOVOLOG) 100 UNIT/ML injection Inject 13 Units into the skin 3 (three) times daily with meals. Patient taking differently: Inject 8-28 Units into the skin 4 (four) times daily. Sliding scale 12/14/14   Luan Moore, MD  levothyroxine (SYNTHROID, LEVOTHROID) 25 MCG tablet Take 2 tablets (50 mcg total) by mouth every morning. 10/25/15   Lavina Hamman, MD  lisinopril (PRINIVIL,ZESTRIL) 5 MG tablet Take 5 mg  by mouth 2 (two) times daily. 12/04/15   Historical Provider, MD  metoCLOPramide (REGLAN) 10 MG tablet Take 10 mg by mouth 4 (four) times daily. Take 1 tablet (10 mg) by mouth 30 minutes before meals and at bedtime 11/25/15   Historical Provider, MD  omeprazole (PRILOSEC) 40 MG capsule Take 40 mg by mouth at bedtime. 11/22/15   Historical Provider, MD  ondansetron (ZOFRAN ODT) 8 MG disintegrating tablet Take 1 tablet (8 mg total) by mouth every 8 (eight) hours as needed for nausea. 09/15/14   Varney Biles, MD  oxybutynin (DITROPAN-XL) 5 MG 24 hr tablet Take 5 mg by mouth daily. 11/22/15   Historical Provider, MD  rosuvastatin (CRESTOR) 20 MG tablet Take 20 mg by mouth daily. 11/22/15   Historical Provider, MD  SSD 1 % cream Apply 1 application topically daily. 11/29/15   Historical Provider, MD       Physical Exam: BP 141/75 mmHg  Pulse 107  Temp(Src) 98 F (36.7 C) (Oral)  Resp 19  Ht '5\' 5"'  (1.651 m)  Wt 93.123 kg (205 lb 4.8 oz)  BMI 34.16 kg/m2  SpO2 95% General appearance: Obese adult female, alert and in acute distress.   Eyes: Anicteric, conjunctiva red, lids and lashes normal.     ENT: No nasal deformity, discharge, or epistaxis.  OP moist without lesions.  Edentulous. Lymph: No cervical or supraclavicular lymphadenopathy. Skin: Warm and dry.  No mottling.  No jaundice.  Redness high up on leg as below, pain is mostly high on leg (at calf and outer thigh), not clear that area around ulcer is swollen or painful.  Outer thigh patch is somewhat eczematous, vesicular, herpetiform.   Outer left thigh:    Cardiac: RRR, nl S1-S2, 1/6 early SEM.  Capillary refill is brisk.  JVP not visible.  No LE edema.  Radial pulses 2+ and symmetric.  Left DP pulse not palpable, but warm. Respiratory: Normal respiratory rate and rhythm.  CTAB without rales or wheezes. Abdomen: Abdomen soft without rigidity.  No TTP. No ascites, distension.   MSK: No deformities or effusions.  RIGHT AKA.  Neuro: Cranial  nerves normal.Sensorium intact and responding to questions, attention normal.  Speech is fluent.  Moves all extremities equally and with normal coordination.    Psych: Behavior appropriate.  Affect  normal.  No evidence of aural or visual hallucinations or delusions.        Labs on Admission:  I have personally reviewed the following studies: The metabolic panel shows Hyponatremia, elevated BUN creatinine ratio, and elevated creatinine. Hyperglycemia. Albumin is 2.2. Transaminitis and bilirubin are normal. Lactate is normal. The complete blood count shows mild leukocytosis, normocytic anemia, normal platelets.   Radiological Exams on Admission: Ct Femur Left Wo Contrast  12/04/2015  CLINICAL DATA:  Distal left femoral rash. Nonhealing wound left foot. EXAM: CT OF THE LEFT FEMUR WITHOUT CONTRAST TECHNIQUE: Multidetector CT imaging was performed according to the standard protocol. Multiplanar CT image reconstructions were also generated. COMPARISON:  None. FINDINGS: There is subcutaneous edema and mild skin thickening involving the lateral aspect of the distal left thigh. The abnormality is confined to the skin and subcutaneous tissues. There is no intramuscular collection or inflammation. There is no soft tissue gas. There is no drainable fluid collection. A few reactive appearing nodes are present of the left inguinal region, also surrounded by mild subcutaneous stranding. No bony abnormality. IMPRESSION: The findings are most consistent with cellulitis. No soft tissue gas. No abscess. No intramuscular or bony abnormality. Electronically Signed   By: Andreas Newport M.D.   On: 12/04/2015 21:56    Echocardiogram Feb 2016: EF 55% no significant valvular disease    Assessment/Plan 1. AKI/dehydration:  Presumably pre-renal.  Check urinalysis and urine electrolytes.   -Check FeURea -Urinalysis -Fluids and trend BMP   2. Cellulitis in diabetic:  Has large ulcer at base of foot, not  clear how much cellulitis is near this ulcer, suspect this is just the portal of entry, and pain redness is moslty higher up on leg.  Will treat as DFI.  Pt has history of MRSA. -Ceftriaxone, metronidazole, vancomycin -Check ABI left  -Check CRP, prealb, ESR  3. Hypoalbuminemia:   4. Hyponatremia:  Suspect this is mostly artifact from hyperglycemia. -Trend BMP  5. IDDM:  -Continue home Levemir 45 units BID per patient -Corrections ordered  6. Hypothyroidism:  -Continue levothyroxine  7. Chronic disatolic CHF:  Euvolemic. -Hold furosemide and ACEi given Cr -Continue aspirin, statin, BB, hydralazine -Strict I/Os  8. OSA:  -Continue CPAP  9. COPD not on home O2 anymore:  -Continue Breo  10. Depression and chronic pain:  -Continue fluoxetine, gabapentin  11. Bladder:  -Continue oxybutynin  12. Smoking: Cessation was recommended    DVT prophylaxis: Lovenox  Code Status: FULL  Family Communication: None present  Disposition Plan: Anticipate IV antibiotics overnight.  Trend BMP.  If creatinine completely normal tomorrow and no new fever/tachycardia, anticipate patient home tomorrow with oral antibiotics.  Otherwise IV until redness improves and then home. Consults called: None  Admission status: I recommend admission to medical surgical bed, observation status.  Clinical condition: stable.      Edwin Dada [facility] Pager [phone]

## 2015-12-06 ENCOUNTER — Observation Stay (HOSPITAL_BASED_OUTPATIENT_CLINIC_OR_DEPARTMENT_OTHER): Payer: Medicare HMO

## 2015-12-06 DIAGNOSIS — I5032 Chronic diastolic (congestive) heart failure: Secondary | ICD-10-CM | POA: Diagnosis not present

## 2015-12-06 DIAGNOSIS — L03116 Cellulitis of left lower limb: Secondary | ICD-10-CM | POA: Diagnosis not present

## 2015-12-06 DIAGNOSIS — M7989 Other specified soft tissue disorders: Secondary | ICD-10-CM | POA: Diagnosis not present

## 2015-12-06 DIAGNOSIS — J410 Simple chronic bronchitis: Secondary | ICD-10-CM | POA: Diagnosis not present

## 2015-12-06 LAB — MAGNESIUM: MAGNESIUM: 2 mg/dL (ref 1.7–2.4)

## 2015-12-06 LAB — HEMOGLOBIN A1C
HEMOGLOBIN A1C: 10 % — AB (ref 4.8–5.6)
Mean Plasma Glucose: 240 mg/dL

## 2015-12-06 LAB — BASIC METABOLIC PANEL
ANION GAP: 9 (ref 5–15)
BUN: 34 mg/dL — ABNORMAL HIGH (ref 6–20)
CALCIUM: 8.5 mg/dL — AB (ref 8.9–10.3)
CO2: 24 mmol/L (ref 22–32)
Chloride: 105 mmol/L (ref 101–111)
Creatinine, Ser: 1.45 mg/dL — ABNORMAL HIGH (ref 0.44–1.00)
GFR, EST AFRICAN AMERICAN: 46 mL/min — AB (ref >60)
GFR, EST NON AFRICAN AMERICAN: 40 mL/min — AB (ref >60)
Glucose, Bld: 229 mg/dL — ABNORMAL HIGH (ref 65–99)
POTASSIUM: 4.6 mmol/L (ref 3.5–5.1)
SODIUM: 138 mmol/L (ref 135–145)

## 2015-12-06 LAB — GLUCOSE, CAPILLARY
GLUCOSE-CAPILLARY: 212 mg/dL — AB (ref 65–99)
GLUCOSE-CAPILLARY: 282 mg/dL — AB (ref 65–99)
GLUCOSE-CAPILLARY: 164 mg/dL — AB (ref 65–99)
Glucose-Capillary: 176 mg/dL — ABNORMAL HIGH (ref 65–99)

## 2015-12-06 LAB — UREA NITROGEN, URINE: Urea Nitrogen, Ur: 518 mg/dL

## 2015-12-06 MED ORDER — SULFAMETHOXAZOLE-TRIMETHOPRIM 800-160 MG PO TABS
1.0000 | ORAL_TABLET | Freq: Two times a day (BID) | ORAL | Status: DC
Start: 2015-12-06 — End: 2015-12-07
  Administered 2015-12-06 – 2015-12-07 (×3): 1 via ORAL
  Filled 2015-12-06 (×4): qty 1

## 2015-12-06 MED ORDER — VALACYCLOVIR HCL 500 MG PO TABS
500.0000 mg | ORAL_TABLET | Freq: Three times a day (TID) | ORAL | Status: DC
  Filled 2015-12-06: qty 1

## 2015-12-06 MED ORDER — ACYCLOVIR 5 % EX CREA
TOPICAL_CREAM | CUTANEOUS | Status: DC
  Filled 2015-12-06: qty 5

## 2015-12-06 MED ORDER — GABAPENTIN 300 MG PO CAPS
300.0000 mg | ORAL_CAPSULE | Freq: Three times a day (TID) | ORAL | Status: DC
  Administered 2015-12-06 – 2015-12-07 (×4): 300 mg via ORAL
  Filled 2015-12-06 (×5): qty 1

## 2015-12-06 MED ORDER — SILVER SULFADIAZINE 1 % EX CREA
TOPICAL_CREAM | Freq: Every day | CUTANEOUS | Status: DC
  Administered 2015-12-06 – 2015-12-07 (×2): via TOPICAL
  Filled 2015-12-06: qty 85

## 2015-12-06 MED ORDER — INSULIN DETEMIR 100 UNIT/ML ~~LOC~~ SOLN
55.0000 [IU] | Freq: Two times a day (BID) | SUBCUTANEOUS | Status: DC
  Administered 2015-12-06 – 2015-12-07 (×3): 55 [IU] via SUBCUTANEOUS
  Filled 2015-12-06 (×5): qty 0.55

## 2015-12-06 MED ORDER — VALACYCLOVIR HCL 500 MG PO TABS
1000.0000 mg | ORAL_TABLET | Freq: Three times a day (TID) | ORAL | Status: DC
  Administered 2015-12-06 – 2015-12-07 (×4): 1000 mg via ORAL
  Filled 2015-12-06 (×5): qty 2

## 2015-12-06 MED ORDER — ACYCLOVIR 5 % EX OINT
TOPICAL_OINTMENT | CUTANEOUS | Status: DC
  Administered 2015-12-06 – 2015-12-07 (×6): via TOPICAL
  Administered 2015-12-07: 1 via TOPICAL
  Administered 2015-12-07: 03:00:00 via TOPICAL
  Filled 2015-12-06 (×2): qty 15

## 2015-12-06 NOTE — Progress Notes (Addendum)
Initial Nutrition Assessment  DOCUMENTATION CODES:   Obesity unspecified  INTERVENTION:   -Continue Heart Healthy/Carb Modified diet, monitor PO intake, and supplement diet as appropriate  NUTRITION DIAGNOSIS:   Increased nutrient needs related to wound healing as evidenced by estimated needs.  GOAL:   Patient will meet greater than or equal to 90% of their needs  MONITOR:   PO intake, Supplement acceptance, Labs, Weight trends, Skin, I & O's  REASON FOR ASSESSMENT:   Malnutrition Screening Tool    ASSESSMENT:   [name] is a 55 y.o. female with a past medical history significant for IDDM, hypothyroidism, HTN, chronic diastolic CHF EF 55%, OSA not on CPAP, and COPD not on home O2 anymore who presents with leg redness and pain, found to have elevated creatinine incidentally.  Pt admitted with AKI and dehydration.   Hx obtained from pt and pt mother at bedside. Pt reports chronically good appetite. She typically consumes 3 meals per day. She sharess that her CBGS tend to run between 200-300, but reports they historically run in the 400's. Pt reports that she has been able to decrease her CBGS as a result of "eating less junk". She reports she now just drinks water and diet soda and expresses pride in this accomplishment ("I used to hate water, but I know it's good for me").   Pt endorses weight loss, however, reports wt fluctuates at baseline. Pt and mother report UBW ranges between 201-208#.   Nutrition-Focused physical exam completed. Findings are no fat depletion, no muscle depletion, and no edema. Pt mother reports that pt has been more active lately.   Case discussed with RN, who confirms presence of lt leg cellulitis and lt foot DM ulcer. Per RN, pt will be transitioned to isolation room, due to suspected shingles. Pt with rt BKA.   Discussed importance of good PO intake and efforts to improve glycemic control to assist with wound healing. Pt denies any further  questions about diet or DM management. Praised pt for transition to calorie free beverages.   Labs reviewed: CBGS: 212-310. Latest Hgb A1c: 10.0.  Diet Order:  Diet heart healthy/carb modified Room service appropriate?: Yes; Fluid consistency:: Thin  Skin:  Wound (see comment) (lt leg cellulitis, chronic DM lt foot ulcer)  Last BM:  12/03/15  Height:   Ht Readings from Last 1 Encounters:  12/05/15 5\' 5"  (1.651 m)    Weight:   Wt Readings from Last 1 Encounters:  12/05/15 213 lb (96.616 kg)    Ideal Body Weight:  53.1 kg  BMI:  Body mass index is 35.44 kg/(m^2).  Estimated Nutritional Needs:   Kcal:  1600-1800  Protein:  80-95 grams  Fluid:  1.6-1.8 L  EDUCATION NEEDS:   Education needs addressed  Meily Glowacki A. 12/07/15, RD, LDN, CDE Pager: [phone] After hours Pager: [phone]

## 2015-12-06 NOTE — Progress Notes (Signed)
VASCULAR LAB PRELIMINARY  ARTERIAL  ABI completed:Right ABI not ascertained secondary to BKA.  Left ABI indicates mild reduction in arterial blood flow.     RIGHT    LEFT    PRESSURE WAVEFORM  PRESSURE WAVEFORM  BRACHIAL 157 T BRACHIAL TAPE   DP   DP    AT   AT 140 T  PT   PT 136 T  PER   PER    GREAT TOE  NA GREAT TOE  NA    RIGHT LEFT  ABI  0.89     Remington Highbaugh, RVT 12/06/2015, 11:35 AM

## 2015-12-06 NOTE — Progress Notes (Signed)
PROGRESS NOTE                                                                                                                                                                                                             Patient Demographics:    [name], is a 55 y.o. female, DOB - 04-Dec-1960, FOY:[id number]  Admit date - 12/04/2015   Admitting Physician Alberteen Sam, MD  Outpatient Primary MD for the patient is TALBOT, Dineen Kid, MD  LOS -   Outpatient Specialists: Ortho Dr Lajoyce Corners  Chief Complaint  Patient presents with  . Cellulitis       Brief Narrative     Subjective:    [name] today has, No headache, No chest pain, No abdominal pain - No Nausea, No new weakness tingling or numbness, No Cough - SOB. Mild L Leg redness   Assessment  & Plan :     1.Left leg cellulitis with chronic left diabetic foot ulcer as below. I do not think this ulcer is infected, she follows with Dr. Lajoyce Corners for the same, For her leg cellulitis we'll stop antibiotics and transitioned to oral Bactrim, we'll have her follow with Dr. Lajoyce Corners within 3-4 days, her left outer thigh rash has now developed some vesicles, question if this is shingles. She does complain of some burning on that area as well. Will place her on valacyclovir topical and oral along with Neurontin and monitor, placed on droplet precautions.  2. Chronic diastolic dysfunction EF 60%. Completely compensated.  3. Dyslipidemia. Continue home dose statin  4. Hypothyroidism. On Synthroid continue.  5. Anemia of chronic disease. Supportive care. No need for transfusion.  6. GERD. On PPI.  7. Hypertension. On Coreg stable.  8. History of Charcot foot with diabetic neuropathy. Supportive care.   9. OSA. CPAP daily at bedtime.   10. Underlying COPD. Stable. No wheezing.   11. Ongoing smoking. Counseled to quit.   12. DM type II. On Levimir 45 units at home  along with sliding scale. Monitor. Poor outpatient control. We'll increase Levemir to 55 twice a day.  Lab Results  Component Value Date   HGBA1C 11.9* 10/20/2015   CBG (last 3)   Recent Labs  12/05/15 1705 12/05/15 2233 12/06/15 0753  GLUCAP 310* 276* 212*       Code Status :  Full  Family Communication  : None  Disposition Plan  : Home in am  Barriers For Discharge : Left foot and leg infection  Consults  :  None  Procedures  :  CT soft tissue left leg confirming cellulitis  DVT Prophylaxis  :  Lovenox    Lab Results  Component Value Date   PLT 178 12/05/2015    Antibiotics  :     Anti-infectives    Start     Dose/Rate Route Frequency Ordered Stop   12/06/15 1000  valACYclovir (VALTREX) tablet 500 mg  Status:  Discontinued     500 mg Oral 3 times daily 12/06/15 0843 12/06/15 0937   12/06/15 1000  sulfamethoxazole-trimethoprim (BACTRIM DS,SEPTRA DS) 800-160 MG per tablet 1 tablet     1 tablet Oral Every 12 hours 12/06/15 0936     12/06/15 1000  valACYclovir (VALTREX) tablet 1,000 mg     1,000 mg Oral 3 times daily 12/06/15 0937     12/05/15 2100  vancomycin (VANCOCIN) 1,500 mg in sodium chloride 0.9 % 500 mL IVPB  Status:  Discontinued     1,500 mg 250 mL/hr over 120 Minutes Intravenous Every 24 hours 12/04/15 2009 12/06/15 0936   12/05/15 0130  metroNIDAZOLE (FLAGYL) tablet 500 mg  Status:  Discontinued     500 mg Oral 3 times per day 12/05/15 0127 12/06/15 0936   12/05/15 0127  cefTRIAXone (ROCEPHIN) 2 g in dextrose 5 % 50 mL IVPB  Status:  Discontinued     2 g 100 mL/hr over 30 Minutes Intravenous Every 24 hours 12/05/15 0127 12/06/15 0936   12/04/15 2030  vancomycin (VANCOCIN) 1,750 mg in sodium chloride 0.9 % 500 mL IVPB     1,750 mg 250 mL/hr over 120 Minutes Intravenous  Once 12/04/15 2008 12/05/15 0700        Objective:   Filed Vitals:   12/05/15 1444 12/05/15 2235 12/06/15 0621 12/06/15 0908  BP: 120/51 128/66 135/65 138/64  Pulse: 71 106 71  83  Temp: 98 F (36.7 C) 97.6 F (36.4 C) 97.1 F (36.2 C)   TempSrc: Oral Oral    Resp: 20 19 18    Height:      Weight:      SpO2: 96% 94% 96%     Wt Readings from Last 3 Encounters:  12/05/15 96.616 kg (213 lb)  10/24/15 91.2 kg (201 lb 1 oz)  07/09/15 98.884 kg (218 lb)     Intake/Output Summary (Last 24 hours) at 12/06/15 0937 Last data filed at 12/06/15 0839  Gross per 24 hour  Intake 1687.5 ml  Output    350 ml  Net 1337.5 ml     Physical Exam  Awake Alert, Oriented X 3, No new F.N deficits, Normal affect .AT,PERRAL Supple Neck,No JVD, No cervical lymphadenopathy appriciated.  Symmetrical Chest wall movement, Good air movement bilaterally, CTAB RRR,No Gallops,Rubs or new Murmurs, No Parasternal Heave +ve B.Sounds, Abd Soft, No tenderness, No organomegaly appriciated, No rebound - guarding or rigidity. No Cyanosis, Clubbing or edema, No new Rash or bruise , R BKA  L foot     Outer left thigh:      Data Review:    CBC  Recent Labs Lab 12/04/15 1915 12/05/15 0337  WBC 11.4* 9.1  HGB 11.1* 10.1*  HCT 33.9* 30.9*  PLT 201 178  MCV 84.1 84.2  MCH 27.5 27.5  MCHC 32.7 32.7  RDW 14.6 14.8  LYMPHSABS 2.2  --   MONOABS  1.1*  --   EOSABS 0.0  --   BASOSABS 0.0  --     Chemistries   Recent Labs Lab 12/04/15 1915 12/05/15 0337 12/06/15 0242  NA 131* 134* 138  K 3.9 3.3* 4.6  CL 94* 99* 105  CO2 25 24 24   GLUCOSE 283* 148* 229*  BUN 35* 30* 34*  CREATININE 1.74* 1.40* 1.45*  CALCIUM 8.9 8.3* 8.5*  MG  --   --  2.0  AST 23  --   --   ALT 23  --   --   ALKPHOS 141*  --   --   BILITOT 0.5  --   --    ------------------------------------------------------------------------------------------------------------------ No results for input(s): CHOL, HDL, LDLCALC, TRIG, CHOLHDL, LDLDIRECT in the last 72 hours.  Lab Results  Component Value Date   HGBA1C 11.9* 10/20/2015    ------------------------------------------------------------------------------------------------------------------ No results for input(s): TSH, T4TOTAL, T3FREE, THYROIDAB in the last 72 hours.  Invalid input(s): FREET3 ------------------------------------------------------------------------------------------------------------------ No results for input(s): VITAMINB12, FOLATE, FERRITIN, TIBC, IRON, RETICCTPCT in the last 72 hours.  Coagulation profile No results for input(s): INR, PROTIME in the last 168 hours.  No results for input(s): DDIMER in the last 72 hours.  Cardiac Enzymes No results for input(s): CKMB, TROPONINI, MYOGLOBIN in the last 168 hours.  Invalid input(s): CK ------------------------------------------------------------------------------------------------------------------ No results found for: BNP  Inpatient Medications  Scheduled Meds: . acyclovir ointment   Topical Q3H  . aspirin EC  81 mg Oral Daily  . atorvastatin  80 mg Oral q1800  . carvedilol  3.125 mg Oral BID WC  . enoxaparin (LOVENOX) injection  40 mg Subcutaneous Q24H  . FLUoxetine  60 mg Oral Daily  . gabapentin  300 mg Oral TID  . hydrALAZINE  25 mg Oral 3 times per day  . insulin aspart  0-15 Units Subcutaneous TID WC  . insulin aspart  0-5 Units Subcutaneous QHS  . insulin detemir  50 Units Subcutaneous BID  . levothyroxine  50 mcg Oral BH-q7a  . oxybutynin  5 mg Oral Daily  . pantoprazole  80 mg Oral Daily  . sulfamethoxazole-trimethoprim  1 tablet Oral Q12H  . valACYclovir  1,000 mg Oral TID   Continuous Infusions:   PRN Meds:.acetaminophen **OR** [DISCONTINUED] acetaminophen, HYDROcodone-acetaminophen, [DISCONTINUED] ondansetron **OR** ondansetron (ZOFRAN) IV, senna-docusate  Micro Results Recent Results (from the past 240 hour(s))  Culture, blood (Routine X 2) w Reflex to ID Panel     Status: None (Preliminary result)   Collection Time: 12/04/15  8:20 PM  Result Value Ref Range  Status   Specimen Description BLOOD RIGHT HAND  Final   Special Requests BOTTLES DRAWN AEROBIC AND ANAEROBIC 5CC  Final   Culture NO GROWTH < 24 HOURS  Final   Report Status PENDING  Incomplete  Culture, blood (Routine X 2) w Reflex to ID Panel     Status: None (Preliminary result)   Collection Time: 12/04/15  8:30 PM  Result Value Ref Range Status   Specimen Description BLOOD RIGHT ARM  Final   Special Requests BOTTLES DRAWN AEROBIC AND ANAEROBIC 5CC  Final   Culture NO GROWTH < 24 HOURS  Final   Report Status PENDING  Incomplete  MRSA PCR Screening     Status: None   Collection Time: 12/05/15  9:41 AM  Result Value Ref Range Status   MRSA by PCR NEGATIVE NEGATIVE Final    Comment:        The GeneXpert MRSA Assay (FDA approved for NASAL  specimens only), is one component of a comprehensive MRSA colonization surveillance program. It is not intended to diagnose MRSA infection nor to guide or monitor treatment for MRSA infections.     Radiology Reports Ct Femur Left Wo Contrast  12/04/2015  CLINICAL DATA:  Distal left femoral rash. Nonhealing wound left foot. EXAM: CT OF THE LEFT FEMUR WITHOUT CONTRAST TECHNIQUE: Multidetector CT imaging was performed according to the standard protocol. Multiplanar CT image reconstructions were also generated. COMPARISON:  None. FINDINGS: There is subcutaneous edema and mild skin thickening involving the lateral aspect of the distal left thigh. The abnormality is confined to the skin and subcutaneous tissues. There is no intramuscular collection or inflammation. There is no soft tissue gas. There is no drainable fluid collection. A few reactive appearing nodes are present of the left inguinal region, also surrounded by mild subcutaneous stranding. No bony abnormality. IMPRESSION: The findings are most consistent with cellulitis. No soft tissue gas. No abscess. No intramuscular or bony abnormality. Electronically Signed   By: Ellery Plunk M.D.   On:  12/04/2015 21:56    Time Spent in minutes  25   SINGH,PRASHANT K M.D on 12/06/2015 at 9:37 AM  Between 7am to 7pm - Pager - [phone]  After 7pm go to [email or web address] - password [facility]  [facility] -  Office  [phone]

## 2015-12-06 NOTE — Progress Notes (Signed)
Inpatient Diabetes Program Recommendations  AACE/ADA: New Consensus Statement on Inpatient Glycemic Control (2015)  Target Ranges:  Prepandial:   less than 140 mg/dL      Peak postprandial:   less than 180 mg/dL (1-2 hours)      Critically ill patients:  140 - 180 mg/dL   Results for [name], [name] (MRN [id number]) as of 12/06/2015 12:22  Ref. Range 12/05/2015 07:40 12/05/2015 11:53 12/05/2015 17:05 12/05/2015 22:33  Glucose-Capillary Latest Ref Range: 65-99 mg/dL 276 (H) 147 (H) 092 (H) 276 (H)   Results for [name], [name] (MRN [id number]) as of 12/06/2015 12:22  Ref. Range 12/06/2015 07:53 12/06/2015 11:58  Glucose-Capillary Latest Ref Range: 65-99 mg/dL 709 (H) 643 (H)   Results for [name], [name] (MRN [id number]) as of 12/06/2015 12:22  Ref. Range 10/13/2015 09:53 10/20/2015 06:54 12/05/2015 03:37  Hemoglobin A1C Latest Ref Range: 4.8-5.6 % 12.6 (H) 11.9 (H) 10.0 (H)    Admit with: Foot Ulcer/ LE Cellulitis  History: DM, CHF, COPD  Home DM Meds: Levemir 90 units daily       Novolog 0-50 units tid per SSI  Current Insulin Orders: Levemir 55 units bid      Novolog Moderate Correction Scale/ SSI (0-15 units) TID AC + HS      -Note Levemir increased to 55 units bid today.  Pt received total of 95 units Levemir yesterday (04/16).  Will get total of 110 units Levemir today.  -Eating 100% of meals.  -Note Current A1c 10%.  Patient was last seen by her PCP on 11/15/15 (Dr. Reche Dixon with [facility]).  At that visit, patient admitted to not changing her insulin pen needles every time with every injection.  Was instructed about the importance of using new needle for every injection.  No changes made to her insulin regimen.  Was instructed to continue Levemir and Novolog SSI.    MD- Please consider the following in-hospital insulin adjustments:  Please start Novolog Meal Coverage- Novolog 6 units tid with meals (hold if pt eats <50% of meal)      --Will follow patient during  hospitalization--  Ambrose Finland RN, MSN, CDE Diabetes Coordinator Inpatient Glycemic Control Team Team Pager: [phone] (8a-5p)

## 2015-12-07 ENCOUNTER — Observation Stay (HOSPITAL_BASED_OUTPATIENT_CLINIC_OR_DEPARTMENT_OTHER)
Admission: AD | Admit: 2015-12-07 | Discharge: 2015-12-08 | Disposition: A | Discharge status: Home / Self Care | Payer: Medicare HMO | Source: Ambulatory Visit | Attending: Internal Medicine | Admitting: Internal Medicine

## 2015-12-07 ENCOUNTER — Observation Stay (HOSPITAL_COMMUNITY): Payer: Medicare HMO

## 2015-12-07 ENCOUNTER — Other Ambulatory Visit: Payer: Self-pay

## 2015-12-07 DIAGNOSIS — R110 Nausea: Secondary | ICD-10-CM

## 2015-12-07 DIAGNOSIS — IMO0002 Reserved for concepts with insufficient information to code with codable children: Secondary | ICD-10-CM | POA: Diagnosis present

## 2015-12-07 DIAGNOSIS — E118 Type 2 diabetes mellitus with unspecified complications: Secondary | ICD-10-CM | POA: Diagnosis not present

## 2015-12-07 DIAGNOSIS — E1122 Type 2 diabetes mellitus with diabetic chronic kidney disease: Secondary | ICD-10-CM | POA: Diagnosis not present

## 2015-12-07 DIAGNOSIS — L03116 Cellulitis of left lower limb: Secondary | ICD-10-CM | POA: Diagnosis present

## 2015-12-07 DIAGNOSIS — J410 Simple chronic bronchitis: Secondary | ICD-10-CM | POA: Diagnosis not present

## 2015-12-07 DIAGNOSIS — N179 Acute kidney failure, unspecified: Secondary | ICD-10-CM | POA: Diagnosis present

## 2015-12-07 DIAGNOSIS — N189 Chronic kidney disease, unspecified: Secondary | ICD-10-CM

## 2015-12-07 DIAGNOSIS — I5032 Chronic diastolic (congestive) heart failure: Secondary | ICD-10-CM | POA: Diagnosis not present

## 2015-12-07 DIAGNOSIS — B009 Herpesviral infection, unspecified: Secondary | ICD-10-CM | POA: Diagnosis not present

## 2015-12-07 DIAGNOSIS — E1165 Type 2 diabetes mellitus with hyperglycemia: Secondary | ICD-10-CM | POA: Diagnosis present

## 2015-12-07 DIAGNOSIS — E875 Hyperkalemia: Secondary | ICD-10-CM | POA: Diagnosis present

## 2015-12-07 DIAGNOSIS — F17200 Nicotine dependence, unspecified, uncomplicated: Secondary | ICD-10-CM | POA: Diagnosis present

## 2015-12-07 LAB — GLUCOSE, CAPILLARY
GLUCOSE-CAPILLARY: 181 mg/dL — AB (ref 65–99)
GLUCOSE-CAPILLARY: 246 mg/dL — AB (ref 65–99)
GLUCOSE-CAPILLARY: 348 mg/dL — AB (ref 65–99)
Glucose-Capillary: 129 mg/dL — ABNORMAL HIGH (ref 65–99)
Glucose-Capillary: 357 mg/dL — ABNORMAL HIGH (ref 65–99)

## 2015-12-07 LAB — BASIC METABOLIC PANEL
ANION GAP: 9 (ref 5–15)
Anion gap: 13 (ref 5–15)
BUN: 30 mg/dL — AB (ref 6–20)
BUN: 29 mg/dL — AB (ref 6–20)
CO2: 16 mmol/L — ABNORMAL LOW (ref 22–32)
CO2: 22 mmol/L (ref 22–32)
CREATININE: 1.45 mg/dL — AB (ref 0.44–1.00)
Calcium: 9 mg/dL (ref 8.9–10.3)
Calcium: 9 mg/dL (ref 8.9–10.3)
Chloride: 105 mmol/L (ref 101–111)
Chloride: 103 mmol/L (ref 101–111)
Creatinine, Ser: 1.39 mg/dL — ABNORMAL HIGH (ref 0.44–1.00)
GFR calc Af Amer: 46 mL/min — ABNORMAL LOW (ref >60)
GFR calc Af Amer: 49 mL/min — ABNORMAL LOW (ref >60)
GFR, EST NON AFRICAN AMERICAN: 40 mL/min — AB (ref >60)
GFR, EST NON AFRICAN AMERICAN: 42 mL/min — AB (ref >60)
GLUCOSE: 126 mg/dL — AB (ref 65–99)
Glucose, Bld: 194 mg/dL — ABNORMAL HIGH (ref 65–99)
POTASSIUM: 5.2 mmol/L — AB (ref 3.5–5.1)
Potassium: 6.4 mmol/L (ref 3.5–5.1)
SODIUM: 134 mmol/L — AB (ref 135–145)
Sodium: 134 mmol/L — ABNORMAL LOW (ref 135–145)

## 2015-12-07 LAB — OSMOLALITY, URINE: Osmolality, Ur: 271 mOsm/kg — ABNORMAL LOW (ref 300–900)

## 2015-12-07 MED ORDER — ONDANSETRON HCL 4 MG/2ML IJ SOLN: 4.0000 mg | Freq: Four times a day (QID) | INTRAMUSCULAR | Status: DC | PRN

## 2015-12-07 MED ORDER — HYDROCODONE-ACETAMINOPHEN 5-325 MG PO TABS: 1.0000 | ORAL_TABLET | ORAL | Status: DC | PRN

## 2015-12-07 MED ORDER — INSULIN ASPART 100 UNIT/ML ~~LOC~~ SOLN
0.0000 [IU] | Freq: Every day | SUBCUTANEOUS | Status: DC
  Administered 2015-12-07: 4 [IU] via SUBCUTANEOUS

## 2015-12-07 MED ORDER — OXYBUTYNIN CHLORIDE ER 5 MG PO TB24
5.0000 mg | ORAL_TABLET | Freq: Every day | ORAL | Status: DC
  Administered 2015-12-08: 5 mg via ORAL
  Filled 2015-12-07: qty 1

## 2015-12-07 MED ORDER — DOCUSATE SODIUM 100 MG PO CAPS
200.0000 mg | ORAL_CAPSULE | Freq: Two times a day (BID) | ORAL | Status: DC
  Administered 2015-12-07 (×2): 200 mg via ORAL
  Filled 2015-12-07 (×2): qty 2

## 2015-12-07 MED ORDER — LISINOPRIL 5 MG PO TABS: 5.0000 mg | ORAL_TABLET | Freq: Two times a day (BID) | ORAL | Status: DC

## 2015-12-07 MED ORDER — LEVOTHYROXINE SODIUM 50 MCG PO TABS
50.0000 ug | ORAL_TABLET | ORAL | Status: DC
  Administered 2015-12-08: 50 ug via ORAL
  Filled 2015-12-07: qty 1

## 2015-12-07 MED ORDER — DEXTROSE 50 % IV SOLN
1.0000 | Freq: Once | INTRAVENOUS | Status: AC
  Administered 2015-12-07: 50 mL via INTRAVENOUS
  Filled 2015-12-07: qty 50

## 2015-12-07 MED ORDER — ACYCLOVIR 5 % EX OINT: TOPICAL_OINTMENT | CUTANEOUS | Status: DC

## 2015-12-07 MED ORDER — GABAPENTIN 300 MG PO CAPS
300.0000 mg | ORAL_CAPSULE | Freq: Every day | ORAL | Status: DC
  Administered 2015-12-07: 300 mg via ORAL
  Filled 2015-12-07: qty 1

## 2015-12-07 MED ORDER — FLUOXETINE HCL 20 MG PO CAPS
60.0000 mg | ORAL_CAPSULE | Freq: Every day | ORAL | Status: DC
  Administered 2015-12-08: 60 mg via ORAL
  Filled 2015-12-07 (×2): qty 3

## 2015-12-07 MED ORDER — DOXYCYCLINE HYCLATE 100 MG PO TABS
100.0000 mg | ORAL_TABLET | Freq: Two times a day (BID) | ORAL | Status: DC
  Administered 2015-12-07 – 2015-12-08 (×2): 100 mg via ORAL
  Filled 2015-12-07 (×2): qty 1

## 2015-12-07 MED ORDER — HYDRALAZINE HCL 25 MG PO TABS
25.0000 mg | ORAL_TABLET | Freq: Three times a day (TID) | ORAL | Status: DC
  Administered 2015-12-07 – 2015-12-08 (×3): 25 mg via ORAL
  Filled 2015-12-07 (×3): qty 1

## 2015-12-07 MED ORDER — ALBUTEROL SULFATE (2.5 MG/3ML) 0.083% IN NEBU: 2.5000 mg | INHALATION_SOLUTION | RESPIRATORY_TRACT | Status: DC | PRN

## 2015-12-07 MED ORDER — FLUTICASONE FUROATE-VILANTEROL 100-25 MCG/INH IN AEPB
1.0000 | INHALATION_SPRAY | Freq: Every day | RESPIRATORY_TRACT | Status: DC | PRN
  Filled 2015-12-07: qty 28

## 2015-12-07 MED ORDER — SODIUM CHLORIDE 0.9 % IV BOLUS (SEPSIS)
500.0000 mL | Freq: Once | INTRAVENOUS | Status: AC
  Administered 2015-12-07: 500 mL via INTRAVENOUS

## 2015-12-07 MED ORDER — ALBUTEROL SULFATE (2.5 MG/3ML) 0.083% IN NEBU
10.0000 mg | INHALATION_SOLUTION | Freq: Once | RESPIRATORY_TRACT | Status: AC
  Administered 2015-12-07: 10 mg via RESPIRATORY_TRACT
  Filled 2015-12-07: qty 12

## 2015-12-07 MED ORDER — SODIUM BICARBONATE 8.4 % IV SOLN
50.0000 meq | Freq: Once | INTRAVENOUS | Status: AC
  Administered 2015-12-07: 50 meq via INTRAVENOUS
  Filled 2015-12-07: qty 50

## 2015-12-07 MED ORDER — ROSUVASTATIN CALCIUM 20 MG PO TABS
20.0000 mg | ORAL_TABLET | Freq: Every day | ORAL | Status: DC
  Administered 2015-12-07 – 2015-12-08 (×2): 20 mg via ORAL
  Filled 2015-12-07 (×2): qty 1

## 2015-12-07 MED ORDER — INSULIN DETEMIR 100 UNIT/ML ~~LOC~~ SOLN
55.0000 [IU] | Freq: Two times a day (BID) | SUBCUTANEOUS | Status: DC
  Administered 2015-12-07 – 2015-12-08 (×2): 55 [IU] via SUBCUTANEOUS
  Filled 2015-12-07 (×3): qty 0.55

## 2015-12-07 MED ORDER — INSULIN DETEMIR 100 UNIT/ML ~~LOC~~ SOLN: 55.0000 [IU] | Freq: Two times a day (BID) | SUBCUTANEOUS | Status: DC

## 2015-12-07 MED ORDER — METOCLOPRAMIDE HCL 10 MG PO TABS
10.0000 mg | ORAL_TABLET | Freq: Four times a day (QID) | ORAL | Status: DC
  Administered 2015-12-07 – 2015-12-08 (×4): 10 mg via ORAL
  Filled 2015-12-07 (×4): qty 1

## 2015-12-07 MED ORDER — FUROSEMIDE 10 MG/ML IJ SOLN
20.0000 mg | Freq: Once | INTRAMUSCULAR | Status: AC
  Administered 2015-12-07: 20 mg via INTRAVENOUS
  Filled 2015-12-07: qty 2

## 2015-12-07 MED ORDER — INSULIN ASPART 100 UNIT/ML ~~LOC~~ SOLN
0.0000 [IU] | Freq: Three times a day (TID) | SUBCUTANEOUS | Status: DC
  Administered 2015-12-07: 5 [IU] via SUBCUTANEOUS
  Administered 2015-12-08: 2 [IU] via SUBCUTANEOUS

## 2015-12-07 MED ORDER — ACYCLOVIR 5 % EX OINT
TOPICAL_OINTMENT | CUTANEOUS | Status: DC
  Administered 2015-12-07 – 2015-12-08 (×7): via TOPICAL
  Filled 2015-12-07: qty 15

## 2015-12-07 MED ORDER — ASPIRIN EC 81 MG PO TBEC
81.0000 mg | DELAYED_RELEASE_TABLET | Freq: Every day | ORAL | Status: DC
  Administered 2015-12-08: 81 mg via ORAL
  Filled 2015-12-07: qty 1

## 2015-12-07 MED ORDER — POLYETHYLENE GLYCOL 3350 17 G PO PACK
17.0000 g | PACK | Freq: Two times a day (BID) | ORAL | Status: DC
  Administered 2015-12-07 (×2): 17 g via ORAL
  Filled 2015-12-07 (×2): qty 1

## 2015-12-07 MED ORDER — FUROSEMIDE 20 MG PO TABS
20.0000 mg | ORAL_TABLET | Freq: Every day | ORAL | Status: DC
  Administered 2015-12-08: 20 mg via ORAL
  Filled 2015-12-07: qty 1

## 2015-12-07 MED ORDER — PANTOPRAZOLE SODIUM 40 MG PO TBEC
40.0000 mg | DELAYED_RELEASE_TABLET | Freq: Every day | ORAL | Status: DC
  Administered 2015-12-08: 40 mg via ORAL
  Filled 2015-12-07: qty 1

## 2015-12-07 MED ORDER — CARVEDILOL 3.125 MG PO TABS
3.1250 mg | ORAL_TABLET | Freq: Two times a day (BID) | ORAL | Status: DC
  Administered 2015-12-08: 3.125 mg via ORAL
  Filled 2015-12-07: qty 1

## 2015-12-07 MED ORDER — SODIUM POLYSTYRENE SULFONATE 15 GM/60ML PO SUSP
30.0000 g | Freq: Once | ORAL | Status: AC
  Administered 2015-12-07: 30 g via ORAL
  Filled 2015-12-07: qty 120

## 2015-12-07 MED ORDER — VALACYCLOVIR HCL 1 G PO TABS: 1000.0000 mg | ORAL_TABLET | Freq: Three times a day (TID) | ORAL | Status: DC

## 2015-12-07 MED ORDER — SULFAMETHOXAZOLE-TRIMETHOPRIM 800-160 MG PO TABS: 1.0000 | ORAL_TABLET | Freq: Two times a day (BID) | ORAL | Status: DC

## 2015-12-07 MED ORDER — VALACYCLOVIR HCL 500 MG PO TABS
1000.0000 mg | ORAL_TABLET | Freq: Three times a day (TID) | ORAL | Status: DC
  Administered 2015-12-07 – 2015-12-08 (×3): 1000 mg via ORAL
  Filled 2015-12-07 (×3): qty 2

## 2015-12-07 MED ORDER — INSULIN ASPART 100 UNIT/ML IV SOLN
10.0000 [IU] | Freq: Once | INTRAVENOUS | Status: AC
  Administered 2015-12-07: 10 [IU] via INTRAVENOUS
  Filled 2015-12-07: qty 0.1

## 2015-12-07 NOTE — H&P (Addendum)
TRH H&P   Patient Demographics:    [name], is a 55 y.o. female  MRN: [id number]   DOB - Jun 28, 1961  Admit Date - 12/07/2015  Outpatient Primary MD for the patient is TALBOT, DAVID [name], MD  Referring MD/NP/PA: Direct admit  Outpatient Specialists: Dr. Lajoyce Corners orthopedics  Patient coming from: direct admit  No chief complaint on file.     HPI:    [name]  is a 55 y.o. female, With history of insulin-dependent poorly controlled type 2 diabetes mellitus, hypertension, chronic diastolic CHF EF 55%, obstructive sleep apnea on CPAP, COPD not using home oxygen, CKD 3, was recently admitted and discharged for left leg cellulitis and zoster rash, she was discharged this morning at 8 AM, there was a BMP which was ordered on the day of admission for 5 AM this morning but was not drawn at that time, 2 hours after her discharge BMP was drawn and resulted at 10:48 AM, potassium was noted to be high and patient's nurse was called by the lab. However at this time patient had already left the hospital. She was called back for the critical lab value of potassium 6.4. This likely happened as patient was placed on Bactrim for cellulitis causing hyperkalemia.  Patient currently is symptom free except for mild nausea, mother's bedside, hyperkalemia protocol has been initiated, she will be directly admitted for one-day observation if potassium is stable will be discharged in the morning.    Review of systems:    In addition to the HPI above,   No Fever-chills, No Headache, No changes with Vision or hearing, No problems swallowing food  or Liquids, No Chest pain, Cough or Shortness of Breath, No Abdominal pain, Mild Nausea & Vommitting, Bowel movements are regular, No Blood in stool or Urine, No dysuria, No new skin rashes or bruises, No new joints pains-aches,  No new  weakness, tingling, numbness in any extremity, No recent weight gain or loss, No polyuria, polydypsia or polyphagia, No significant Mental Stressors.  A full 10 point Review of Systems was done, except as stated above, all other Review of Systems were negative.   With Past History of the following :    Past Medical History  Diagnosis Date  . Diabetic neuropathy (HCC)   . Cellulitis   . Thyroid disease   . Hyperlipidemia   . Neuromuscular disorder (HCC)   . Concussion as a teenager    slight  . Diarrhea     pt takes Reglan tid  . Hypothyroidism     takes Synthroid daily  . Depression     takes CYmbalta daily  . MRSA (methicillin resistant staph aureus) culture positive     hx of 2012  . Staph infection 2007  . Complication of anesthesia 01/2013    "didn't know where I was; who I was; talking out the top of my head" (2013-05-07)  . Hypertension   . Peripheral vascular disease (HCC)   . CHF (congestive heart failure) (HCC)   . Pneumonia 1980's    "hospitalized w/it" (07-May-2013)  . Chronic bronchitis (HCC)   . Orthopnea     "just one; yesterday morning" (May 07, 2013)  . Type II diabetes mellitus (HCC)     Novolog,Levemir,and Lovaza daily  . GERD (gastroesophageal reflux disease)   . Daily headache   . Rheumatoid arthritis (HCC)     "both knees" (05/07/13)  . Anxiety   . Kidney stone     "passed on before" (05-07-13)  . Non-healing wound of lower extremity     lt foot  . OSA on CPAP     and oxygen  . COPD (chronic obstructive pulmonary disease) (HCC)     wear oxygen at home  . Symptomatic bradycardia 03/04/2014      Past Surgical History  Procedure Laterality Date  . Amputation    . Leg amputation below knee Right 2012  . Toe  amputation Left ?2011     only 2 toes remaning.   . Tubal ligation  1990's  . Foot surgery Right 2012    "put pins in one year; amputated toes another OR" (2013-05-07)  . Wrist surgery Right 1980's    "pinched nerve" (07-May-2013)  . Dilation and curettage of uterus  1980's  . Refractive surgery Bilateral 2014  . Incision and drainage abscess N/A 01/24/2013    Procedure: INCISION AND DRAINAGE ABSCESS;  Surgeon: Clovis Pu. Cornett, MD;  Location: [facility] OR;  Service: General;  Laterality: N/A;  . Inguinal hernia repair N/A 01/28/2013    Procedure: I&D Right Groin Wound;  Surgeon: Shelly Rubenstein, MD;  Location: [facility] OR;  Service: General;  Laterality: N/A;  . Ganglion cyst excision Left 1990's    "wrist" (May 07, 2013)  . Bladder surgery  1970's    "stretched the mouth of my bladder" (05-07-13)  . I&d extremity Left 08/25/2013    Procedure: IRRIGATION AND DEBRIDEMENT EXTREMITY;  Surgeon: Axel Filler, MD;  Location: [facility] OR;  Service: General;  Laterality: Left;  . Pacemaker insertion  03/05/14    Biotronik dual chamber pacemaker implanted by Dr Graciela Husbands for symptomatic bradycardia  . Permanent pacemaker insertion N/A 03/05/2014    Procedure: PERMANENT PACEMAKER INSERTION;  Surgeon: Duke Salvia, MD;  Location: [facility] CATH LAB;  Service: Cardiovascular;  Laterality: N/A;  . Stump revision Right 12/11/2014    Procedure: Right Below Knee Amputation Revision;  Surgeon: Berna Spare  Kandis Mannan, MD;  Location: [facility] OR;  Service: Orthopedics;  Laterality: Right;      Social History:     Social History  Substance Use Topics  . Smoking status: Current Every Day Smoker -- 1.00 packs/day for 33 years    Types: Cigarettes  . Smokeless tobacco: Never Used  . 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 Hypertension Mother   . Hyperlipidemia Mother   . Hypertrophic cardiomyopathy Mother     has pacer  . Hypertension  Brother   . Hyperlipidemia Brother   . Cancer Maternal Aunt   . Diabetes Paternal Aunt   . Diabetes Paternal Uncle   . Diabetes Paternal Grandmother   . Anesthesia problems Neg Hx   . Hypotension Neg Hx   . Malignant hyperthermia Neg Hx   . Pseudochol deficiency Neg Hx   . Alzheimer's disease Father        Home Medications:   Prior to Admission medications   Medication Sig Start Date End Date Taking? Authorizing Provider  acyclovir ointment (ZOVIRAX) 5 % Apply topically every 3 (three) hours. 12/07/15   Leroy Sea, MD  aspirin EC 81 MG tablet Take 81 mg by mouth daily.    Historical Provider, MD  carvedilol (COREG) 3.125 MG tablet Take 1 tablet (3.125 mg total) by mouth 2 (two) times daily with a meal. 10/25/15   Rolly Salter, MD  FLUoxetine (PROZAC) 20 MG tablet Take 60 mg by mouth daily. 11/24/15   Historical Provider, MD  fluticasone furoate-vilanterol (BREO ELLIPTA) 100-25 MCG/INH AEPB Inhale 1 puff into the lungs daily as needed (shortness of breath/ wheezing).    Historical Provider, MD  furosemide (LASIX) 40 MG tablet Take 0.5 tablets (20 mg total) by mouth daily. 10/25/15   Rolly Salter, MD  gabapentin (NEURONTIN) 300 MG capsule Take 300 mg by mouth at bedtime. 12/01/15   Historical Provider, MD  hydrALAZINE (APRESOLINE) 25 MG tablet Take 1 tablet (25 mg total) by mouth every 8 (eight) hours. 10/25/15   Rolly Salter, MD  HYDROcodone-acetaminophen (NORCO/VICODIN) 5-325 MG tablet Take 1 tablet by mouth every 4 (four) hours as needed for moderate pain. 12/07/15   Leroy Sea, MD  insulin aspart (NOVOLOG FLEXPEN) 100 UNIT/ML FlexPen Inject 0-50 Units into the skin 3 (three) times daily with meals. Per sliding scale    Historical Provider, MD  insulin detemir (LEVEMIR) 100 UNIT/ML injection Inject 0.55 mLs (55 Units total) into the skin 2 (two) times daily. 12/07/15   Leroy Sea, MD  levothyroxine (SYNTHROID, LEVOTHROID) 25 MCG tablet Take 2 tablets (50 mcg total) by mouth  every morning. 10/25/15   Rolly Salter, MD  lisinopril (PRINIVIL,ZESTRIL) 5 MG tablet Take 5 mg by mouth 2 (two) times daily. 12/04/15   Historical Provider, MD  metoCLOPramide (REGLAN) 10 MG tablet Take 10 mg by mouth 4 (four) times daily. Take 1 tablet (10 mg) by mouth 30 minutes before meals and at bedtime 11/25/15   Historical Provider, MD  omeprazole (PRILOSEC) 40 MG capsule Take 40 mg by mouth at bedtime. 11/22/15   Historical Provider, MD  oxybutynin (DITROPAN-XL) 5 MG 24 hr tablet Take 5 mg by mouth daily. 11/22/15   Historical Provider, MD  rosuvastatin (CRESTOR) 20 MG tablet Take 20 mg by mouth daily. 11/22/15  Historical Provider, MD  sulfamethoxazole-trimethoprim (BACTRIM DS,SEPTRA DS) 800-160 MG tablet Take 1 tablet by mouth every 12 (twelve) hours. 12/07/15   Leroy Sea, MD  valACYclovir (VALTREX) 1000 MG tablet Take 1 tablet (1,000 mg total) by mouth 3 (three) times daily. 12/07/15   Leroy Sea, MD     Allergies:     Allergies  Allergen Reactions  . Erythromycin Diarrhea  . Iodine-131 Nausea And Vomiting  . Gadolinium Nausea And Vomiting     Code: VOM, Desc: Pt began vomiting immed post infusion of multihance, Onset Date: [id number]   . Ivp Dye [Iodinated Diagnostic Agents] Nausea And Vomiting       . Metformin Diarrhea  . Penicillins Hives    Has patient had a PCN reaction causing immediate rash, facial/tongue/throat swelling, SOB or lightheadedness with hypotension: No Has patient had a PCN reaction causing severe rash involving mucus membranes or skin necrosis: No Has patient had a PCN reaction that required hospitalization No Has patient had a PCN reaction occurring within the last 10 years: No If all of the above answers are "NO", then may proceed with Cephalosporin use.      Physical Exam:   Vitals  There were no vitals taken for this visit.   1. General middle aged white female lying in bed in NAD,     2. Normal affect and insight, Not Suicidal or  Homicidal, Awake Alert, Oriented X 3.  3. No F.N deficits, ALL C.Nerves Intact, Strength 5/5 all 4 extremities, Sensation intact all 4 extremities, Plantars down going.  4. Ears and Eyes appear Normal, Conjunctivae clear, PERRLA. Moist Oral Mucosa.  5. Supple Neck, No JVD, No cervical lymphadenopathy appriciated, No Carotid Bruits.  6. Symmetrical Chest wall movement, Good air movement bilaterally, CTAB.  7. RRR, No Gallops, Rubs or Murmurs, No Parasternal Heave.  8. Positive Bowel Sounds, Abdomen Soft, No tenderness, No organomegaly appriciated,No rebound -guarding or rigidity.  9.  No Cyanosis, Normal Skin Turgor, No Skin Rash or Bruise. Left leg cellulitis above the lower one third of tibia much improved, left thigh lateral cellulitis which appears more consistent with a zoster infection is also improving with few vesicles  10. Good muscle tone,  joints appear normal , no effusions, Normal ROM.  11. No Palpable Lymph Nodes in Neck or Axillae  L foot 12-05-15 - cellulitis     Outer left thigh: 12-05-15 ( today has a few vesicles)       Data Review:    CBC  Recent Labs Lab 12/04/15 1915 12/05/15 0337  WBC 11.4* 9.1  HGB 11.1* 10.1*  HCT 33.9* 30.9*  PLT 201 178  MCV 84.1 84.2  MCH 27.5 27.5  MCHC 32.7 32.7  RDW 14.6 14.8  LYMPHSABS 2.2  --   MONOABS 1.1*  --   EOSABS 0.0  --   BASOSABS 0.0  --    ------------------------------------------------------------------------------------------------------------------  Chemistries   Recent Labs Lab 12/04/15 1915 12/05/15 0337 12/06/15 0242 12/07/15 1048  NA 131* 134* 138 134*  K 3.9 3.3* 4.6 6.4*  CL 94* 99* 105 105  CO2 25 24 24  16*  GLUCOSE 283* 148* 229* 194*  BUN 35* 30* 34* 30*  CREATININE 1.74* 1.40* 1.45* 1.45*  CALCIUM 8.9 8.3* 8.5* 9.0  MG  --   --  2.0  --   AST 23  --   --   --   ALT 23  --   --   --   ALKPHOS 141*  --   --   --  BILITOT 0.5  --   --   --     ------------------------------------------------------------------------------------------------------------------ estimated creatinine clearance is 51 mL/min (by C-G formula based on Cr of 1.45). ------------------------------------------------------------------------------------------------------------------ No results for input(s): TSH, T4TOTAL, T3FREE, THYROIDAB in the last 72 hours.  Invalid input(s): FREET3  Coagulation profile No results for input(s): INR, PROTIME in the last 168 hours. ------------------------------------------------------------------------------------------------------------------- No results for input(s): DDIMER in the last 72 hours. -------------------------------------------------------------------------------------------------------------------  Cardiac Enzymes No results for input(s): CKMB, TROPONINI, MYOGLOBIN in the last 168 hours.  Invalid input(s): CK ------------------------------------------------------------------------------------------------------------------ No results found for: BNP   ---------------------------------------------------------------------------------------------------------------  Urinalysis    Component Value Date/Time   COLORURINE YELLOW 12/05/2015 0207   APPEARANCEUR CLOUDY* 12/05/2015 0207   LABSPEC 1.016 12/05/2015 0207   PHURINE 5.0 12/05/2015 0207   GLUCOSEU 100* 12/05/2015 0207   HGBUR MODERATE* 12/05/2015 0207   HGBUR negative 03/25/2010 1147   BILIRUBINUR NEGATIVE 12/05/2015 0207   KETONESUR NEGATIVE 12/05/2015 0207   PROTEINUR >300* 12/05/2015 0207   UROBILINOGEN 0.2 12/07/2014 2125   NITRITE NEGATIVE 12/05/2015 0207   LEUKOCYTESUR NEGATIVE 12/05/2015 0207    ----------------------------------------------------------------------------------------------------------------   Imaging Results:    No results found.  My personal review of EKG: Rhythm NSR, no Acute ST changes   Assessment & Plan:      1. Hyperkalemia. Likely due to Bactrim use, full hyperkalemia protocol has been initiated EKG is unremarkable, will monitor on telemetry, IV D50 and insulin, IV bicarbonate, albuterol nebulizer, Kayexalate, repeat BMP in a few hours. Discontinue Bactrim and monitor.  2. Left leg cellulitis On the lower leg, likely herpetic lesion on the left thigh with chronic left diabetic foot ulcer as below.   I do not think this ulcer is infected, she follows with Dr. Lajoyce Corners for the same, For her leg cellulitis We will stop Bactrim and switch to doxycycline, we'll have her follow with Dr. Lajoyce Corners within in a few days, her left outer thigh rash has now developed some vesicles, suggestive of shingles we will continue her on valacyclovir topical and oral along with Neurontin, post discharge follow with Dr. Lajoyce Corners.  3. Chronic diastolic dysfunction EF 60%. Completely compensated.  4. Dyslipidemia. Continue home dose statin  5. Hypothyroidism. On Synthroid continue.  6. Anemia of chronic disease. Supportive care. No need for transfusion.  7. GERD. On PPI.  8. Hypertension. On Coreg stable.  9. History of Charcot foot with diabetic neuropathy. Supportive care.   10. OSA. CPAP daily at bedtime.   11. Underlying COPD. Stable. No wheezing.   12. Ongoing smoking. Counseled to quit.   13. CKD 3 - baseline creatinine close to 1.3, close to baseline monitor.   14. DM type II. On Levimir 45 units at home along with sliding scale. Monitor. Poor outpatient control. CBGs much better with Levimir increased to 55 twice a day, along with sliding scale. Monitor CBGs.   DVT Prophylaxis Lovenox    AM Labs Ordered, also please review Full Orders  Family Communication: Admission, patients condition and plan of care including tests being ordered have been discussed with the patient and her mother who indicate understanding and agree with the plan and Code Status.  Code Status Full  Likely DC to  Home 24 hrs  Condition  GUARDED    Consults called: None    Admission status: Obs   Time spent in minutes : 35   SINGH,PRASHANT K M.D on 12/07/2015 at 2:00 PM  Between 7am to 7pm - Pager - [phone]. After 7pm go to  [email or web address] - password [facility]  [facility] - Office  [phone]

## 2015-12-07 NOTE — Progress Notes (Signed)
Discharge papers gone over with pt. Prescriptions given to pt. Pt. States no questions/complaints. IV taken out.

## 2015-12-07 NOTE — Care Management Obs Status (Signed)
MEDICARE OBSERVATION STATUS NOTIFICATION   Patient Details  Name: [name] MRN: [id number] Date of Birth: 06-16-1961   Medicare Observation Status Notification Given:  Left leg cellulitis with chronic left diabetic foot ulcer  this ulcer is not  infected, she follows with Dr. Lajoyce Corners for the same, For her leg cellulitis we'll stop antibiotics and transitioned to oral Bactrim,     Kingsley Plan, RN 12/07/2015, 8:05 AM

## 2015-12-07 NOTE — Progress Notes (Signed)
Gave pt. Soap suds enema and she tolerated well.

## 2015-12-07 NOTE — Discharge Instructions (Signed)
Follow with Primary MD TALBOT, DAVID C, MD in 3 days   Get CBC, CMP, 2 view Chest X ray checked  by Primary MD next visit.    Activity: As tolerated with Full fall precautions use walker/cane & assistance as needed   Disposition Home    Diet:   Heart Healthy Low Carb.  Accuchecks 4 times/day, Once in AM empty stomach and then before each meal. Log in all results and show them to your Prim.MD in 3 days. If any glucose reading is under 80 or above 300 call your Prim MD immidiately. Follow Low glucose instructions for glucose under 80 as instructed.   For Heart failure patients - Check your Weight same time everyday, if you gain over 2 pounds, or you develop in leg swelling, experience more shortness of breath or chest pain, call your Primary MD immediately. Follow Cardiac Low Salt Diet and 1.5 lit/day fluid restriction.   On your next visit with your primary care physician please Get Medicines reviewed and adjusted.   Please request your Prim.MD to go over all Hospital Tests and Procedure/Radiological results at the follow up, please get all Hospital records sent to your Prim MD by signing hospital release before you go home.   If you experience worsening of your admission symptoms, develop shortness of breath, life threatening emergency, suicidal or homicidal thoughts you must seek medical attention immediately by calling 911 or calling your MD immediately  if symptoms less severe.  You Must read complete instructions/literature along with all the possible adverse reactions/side effects for all the Medicines you take and that have been prescribed to you. Take any new Medicines after you have completely understood and accpet all the possible adverse reactions/side effects.   Do not drive, operating heavy machinery, perform activities at heights, swimming or participation in water activities or provide baby sitting services if your were admitted for syncope or siezures until you have  seen by Primary MD or a Neurologist and advised to do so again.  Do not drive when taking Pain medications.    Do not take more than prescribed Pain, Sleep and Anxiety Medications  Special Instructions: If you have smoked or chewed Tobacco  in the last 2 yrs please stop smoking, stop any regular Alcohol  and or any Recreational drug use.  Wear Seat belts while driving.   Please note  You were cared for by a hospitalist during your hospital stay. If you have any questions about your discharge medications or the care you received while you were in the hospital after you are discharged, you can call the unit and asked to speak with the hospitalist on call if the hospitalist that took care of you is not available. Once you are discharged, your primary care physician will handle any further medical issues. Please note that NO REFILLS for any discharge medications will be authorized once you are discharged, as it is imperative that you return to your primary care physician (or establish a relationship with a primary care physician if you do not have one) for your aftercare needs so that they can reassess your need for medications and monitor your lab values.

## 2015-12-07 NOTE — Progress Notes (Signed)
Pt. Discharged successfully via w/c with her mom.

## 2015-12-07 NOTE — Progress Notes (Signed)
Around 1300, lab notified me of critical high potassium of 6.4. At this time, pt. Had already been discharged and left the hospital.  The labs were drawn late, and Lab never notified anyone of needing to draw her labs late, as the labs were scheduled for 04/18 at 0500 and weren't drawn until 10:48 AM. Notified MD. [name] pt. And her mother to come back to the hospital. They came back around 1345 and the pt. Was admitted to the same room. New orders were started.

## 2015-12-07 NOTE — Progress Notes (Signed)
ANTICOAGULATION CONSULT NOTE - Initial Consult  Pharmacy Consult for Lovenox Indication: VTE prophylaxis  Allergies  Allergen Reactions  . Erythromycin Diarrhea  . Iodine-131 Nausea And Vomiting  . Gadolinium Nausea And Vomiting     Code: VOM, Desc: Pt began vomiting immed post infusion of multihance, Onset Date: [id number]   . Ivp Dye [Iodinated Diagnostic Agents] Nausea And Vomiting       . Metformin Diarrhea  . Penicillins Hives    Has patient had a PCN reaction causing immediate rash, facial/tongue/throat swelling, SOB or lightheadedness with hypotension: No Has patient had a PCN reaction causing severe rash involving mucus membranes or skin necrosis: No Has patient had a PCN reaction that required hospitalization No Has patient had a PCN reaction occurring within the last 10 years: No If all of the above answers are "NO", then may proceed with Cephalosporin use.     Patient Measurements:   Heparin Dosing Weight:   Vital Signs: Temp: 97.9 F (36.6 C) (04/18 1411) Temp Source: Oral (04/18 1411) BP: 142/61 mmHg (04/18 1411) Pulse Rate: 63 (04/18 1411)  Labs:  Recent Labs  12/04/15 1915 12/05/15 0337 12/06/15 0242 12/07/15 1048  HGB 11.1* 10.1*  --   --   HCT 33.9* 30.9*  --   --   PLT 201 178  --   --   CREATININE 1.74* 1.40* 1.45* 1.45*    Estimated Creatinine Clearance: 51 mL/min (by C-G formula based on Cr of 1.45).   Medical History: Past Medical History  Diagnosis Date  . Diabetic neuropathy (HCC)   . Cellulitis   . Thyroid disease   . Hyperlipidemia   . Neuromuscular disorder (HCC)   . Concussion as a teenager    slight  . Diarrhea     pt takes Reglan tid  . Hypothyroidism     takes Synthroid daily  . Depression     takes CYmbalta daily  . MRSA (methicillin resistant staph aureus) culture positive     hx of 2012  . Staph infection 2007  . Complication of anesthesia 01/2013    "didn't know where I was; who I was; talking out the top of my  head" (May 20, 2013)  . Hypertension   . Peripheral vascular disease (HCC)   . CHF (congestive heart failure) (HCC)   . Pneumonia 1980's    "hospitalized w/it" (05/20/2013)  . Chronic bronchitis (HCC)   . Orthopnea     "just one; yesterday morning" (05/20/2013)  . Type II diabetes mellitus (HCC)     Novolog,Levemir,and Lovaza daily  . GERD (gastroesophageal reflux disease)   . Daily headache   . Rheumatoid arthritis (HCC)     "both knees" (May 20, 2013)  . Anxiety   . Kidney stone     "passed on before" (05-20-2013)  . Non-healing wound of lower extremity     lt foot  . OSA on CPAP     and oxygen  . COPD (chronic obstructive pulmonary disease) (HCC)     wear oxygen at home  . Symptomatic bradycardia 03/04/2014    Medications:  Scheduled:  . acyclovir ointment   Topical Q3H  . albuterol  10 mg Nebulization Once  . aspirin EC  81 mg Oral Daily  . carvedilol  3.125 mg Oral BID WC  . dextrose  1 ampule Intravenous Once  . docusate sodium  200 mg Oral BID  . doxycycline  100 mg Oral Q12H  . FLUoxetine  60 mg Oral Daily  . furosemide  20  mg Intravenous Once  . furosemide  20 mg Oral Daily  . gabapentin  300 mg Oral QHS  . hydrALAZINE  25 mg Oral Q8H  . insulin aspart  0-15 Units Subcutaneous TID WC  . insulin aspart  0-5 Units Subcutaneous QHS  . insulin aspart  10 Units Intravenous Once  . insulin detemir  55 Units Subcutaneous BID  . [START ON 12/08/2015] levothyroxine  50 mcg Oral BH-q7a  . lisinopril  5 mg Oral BID  . metoCLOPramide  10 mg Oral QID  . oxybutynin  5 mg Oral Daily  . pantoprazole  40 mg Oral Daily  . polyethylene glycol  17 g Oral BID  . rosuvastatin  20 mg Oral Daily  . sodium bicarbonate  50 mEq Intravenous Once  . sodium chloride  500 mL Intravenous Once  . sodium polystyrene  30 g Oral Once  . valACYclovir  1,000 mg Oral TID    Assessment: 55yo female readmitted after discharge earlier today, to receive Lovenox for VTE px.  She received a dose ~1AM today.   Cr 1.45 and has been stable.  Goal of Therapy:  prevention of VTE Monitor platelets by anticoagulation protocol: Yes   Plan:  Lovenox 40mg  SQ q24, start at [facility] will sign off  , PharmD Clinical Pharmacist [facility]

## 2015-12-07 NOTE — Progress Notes (Signed)
CRITICAL VALUE ALERT  Critical value received:  Potassium   Date of notification:  12/07/2015  Time of notification:  1300  Critical value read back: yes  Nurse who received alert:  Fonnie Mu  MD notified (1st page):  Dr. Thedore Mins  Time of first page: 1300  MD notified (2nd page):  Time of second page:  Responding MD:  Dr. Thedore Mins  Time MD responded:  [phone]

## 2015-12-07 NOTE — Discharge Summary (Signed)
[name], is a 55 y.o. female  DOB 10-Dec-1960  MRN [id number].  Admission date:  12/04/2015  Admitting Physician  Alberteen Sam, MD  Discharge Date:  12/07/2015   Primary MD  [name], MD  Recommendations for primary care physician for things to follow:   Please check CBC, BMP, reevaluate left thigh cellulitis and herpes lesion at 2 different sites within 3-5 days   Admission Diagnosis  Rash [R21] Cellulitis of left lower extremity [L03.116] Acute kidney injury [facility]) [N17.9]   Discharge Diagnosis  Rash [R21] Cellulitis of left lower extremity [L03.116] Acute kidney injury (HCC) [N17.9]     Active Problems:   Diabetes mellitus type 2, uncontrolled, with complications (HCC)   Hyponatremia   Anemia of chronic disease   Hypothyroidism   Acute on chronic kidney failure (HCC)   Chronic diastolic heart failure (HCC)   Left leg cellulitis   OSA on CPAP with oxygen   COPD (chronic obstructive pulmonary disease), on home O2   Diabetic foot ulcer (HCC)   Hyperglycemia   Diabetic foot infection (HCC)      Past Medical History  Diagnosis Date  . Diabetic neuropathy (HCC)   . Cellulitis   . Thyroid disease   . Hyperlipidemia   . Neuromuscular disorder (HCC)   . Concussion as a teenager    slight  . Diarrhea     pt takes Reglan tid  . Hypothyroidism     takes Synthroid daily  . Depression     takes CYmbalta daily  . MRSA (methicillin resistant staph aureus) culture positive     hx of 2012  . Staph infection 2007  . Complication of anesthesia 01/2013    "didn't know where I was; who I was; talking out the top of my head" (05-12-2013)  . Hypertension   . Peripheral vascular disease (HCC)   . CHF (congestive heart failure) (HCC)   . Pneumonia 1980's    "hospitalized w/it" (05/12/2013)    . Chronic bronchitis (HCC)   . Orthopnea     "just one; yesterday morning" (05/12/13)  . Type II diabetes mellitus (HCC)     Novolog,Levemir,and Lovaza daily  . GERD (gastroesophageal reflux disease)   . Daily headache   . Rheumatoid arthritis (HCC)     "both knees" (May 12, 2013)  . Anxiety   . Kidney stone     "passed on before" (12-May-2013)  . Non-healing wound of lower extremity     lt foot  . OSA on CPAP     and oxygen  . COPD (chronic obstructive pulmonary disease) (HCC)     wear oxygen at home  . Symptomatic bradycardia 03/04/2014    Past Surgical History  Procedure Laterality Date  . Amputation    . Leg amputation below knee Right 2012  . Toe amputation Left ?2011     only 2 toes remaning.   . Tubal ligation  1990's  . Foot surgery Right 2012    "put pins in one year; amputated toes  another OR" (04/21/2013)  . Wrist surgery Right 1980's    "pinched nerve" (04/21/2013)  . Dilation and curettage of uterus  1980's  . Refractive surgery Bilateral 2014  . Incision and drainage abscess N/A 01/24/2013    Procedure: INCISION AND DRAINAGE ABSCESS;  Surgeon: Clovis Pu. Cornett, MD;  Location: [facility] OR;  Service: General;  Laterality: N/A;  . Inguinal hernia repair N/A 01/28/2013    Procedure: I&D Right Groin Wound;  Surgeon: Shelly Rubenstein, MD;  Location: [facility] OR;  Service: General;  Laterality: N/A;  . Ganglion cyst excision Left 1990's    "wrist" (04/21/2013)  . Bladder surgery  1970's    "stretched the mouth of my bladder" (04/21/2013)  . I&d extremity Left 08/25/2013    Procedure: IRRIGATION AND DEBRIDEMENT EXTREMITY;  Surgeon: Axel Filler, MD;  Location: [facility] OR;  Service: General;  Laterality: Left;  . Pacemaker insertion  03/05/14    Biotronik dual chamber pacemaker implanted by Dr Graciela Husbands for symptomatic bradycardia  . Permanent pacemaker insertion N/A 03/05/2014    Procedure: PERMANENT PACEMAKER INSERTION;  Surgeon: Duke Salvia, MD;  Location: [facility] CATH LAB;  Service: Cardiovascular;   Laterality: N/A;  . Stump revision Right 12/11/2014    Procedure: Right Below Knee Amputation Revision;  Surgeon: Nadara Mustard, MD;  Location: [facility] OR;  Service: Orthopedics;  Laterality: Right;       HPI  from the history and physical done on the day of admission:   [name] is a 55 y.o. female with a past medical history significant for IDDM, hypothyroidism, HTN, chronic diastolic CHF EF 55%, OSA not on CPAP, and COPD not on home O2 anymore who presents with leg redness and pain, found to have elevated creatinine incidentally.  The patient was in her usual state of health until about 2 days ago when she started to develop a patch of redness and pain on her left leg. This is in 2 places 1 on the outer left thigh, and the other circumferentially on the left calf. She has a chronic ulcer the base of the left foot, which she doesn't think has changed much. She's had some malaise, chills today, and vomiting all day yesterday and diarrhea, which she attributed to "stomach flu". Today the redness was worse and pain is worse and so she came to get "checked out".  In the ED, she was afebrile, hemodynamically stable without tachycardia. Na 131, glucose 283, Cr 1.74 from baseline 0.9. WBC 11.4K, Hgb 11.1 stable from baseline. Lactate normal. Blood cultures were obtained and fluid and vancomycin were administered. TRH were asked to evaluate for admission.   She was recently admitted in Feb for HHS and encephalopathy requiring intubation for a period of time. She only uses O2 at home for her COPD sometimes, when traveling outside the home. She does not use CPAP because not compliant. She has used her insulin as usual this week.      Hospital Course:    1.Left leg cellulitis On the lower leg, likely herpetic lesion on the left thigh with chronic left diabetic foot ulcer as below.   I do not think this ulcer is infected, she follows with Dr. Lajoyce Corners for the same, For her leg cellulitis we'll  stop antibiotics and transitioned to oral Bactrim, we'll have her follow with Dr. Lajoyce Corners within in a few days, her left outer thigh rash has now developed some vesicles, question if this is shingles. She does complain of some burning on that area  as well. Will place her on valacyclovir topical and oral along with Neurontin , she feels better and will be discharged home to follow with PCP and Dr. Lajoyce Corners within a week.  L foot 12-05-15 - cellulitis     Outer left thigh: 12-05-15 ( today has a few vesicles)     2. Chronic diastolic dysfunction EF 60%. Completely compensated.  3. Dyslipidemia. Continue home dose statin  4. Hypothyroidism. On Synthroid continue.  5. Anemia of chronic disease. Supportive care. No need for transfusion.  6. GERD. On PPI.  7. Hypertension. On Coreg stable.  8. History of Charcot foot with diabetic neuropathy. Supportive care.   9. OSA. CPAP daily at bedtime.   10. Underlying COPD. Stable. No wheezing.   11. Ongoing smoking. Counseled to quit.   12. DM type II. On Levimir 45 units at home along with sliding scale. Monitor. Poor outpatient control. CBGs much better with Levimir increased to 55 twice a day, will continue upon discharge, written instructions on Accu-Cheks every before meals at bedtime and to maintain a logbook have been provided, patient told to present the logbook to PCP next visit for further adjustment if needed.  Lab Results  Component Value Date   HGBA1C 10.0* 12/05/2015   CBG (last 3)   Recent Labs  12/06/15 1736 12/06/15 2206 12/07/15 0755  GLUCAP 164* 176* 181*       Follow UP  Follow-up Information    Follow up with TALBOT, DAVID C, MD. Schedule an appointment as soon as possible for a visit in 3 days.   Specialty:  Internal Medicine   Contact information:   [address] [place] [address] [phone]       Follow up with DUDA,MARCUS V, MD. Schedule an appointment as soon as possible for a visit in 1  week.   Specialty:  Orthopedic Surgery   Contact information:   [name] [place] [address] [phone]        Consults obtained - None  Discharge Condition: Stable  Diet and Activity recommendation: See Discharge Instructions below  Discharge Instructions       Discharge Instructions    Discharge instructions    Complete by:  As directed   Follow with Primary MD TALBOT, DAVID C, MD in 3 days   Get CBC, CMP, 2 view Chest X ray checked  by Primary MD next visit.    Activity: As tolerated with Full fall precautions use walker/cane & assistance as needed   Disposition Home    Diet:   Heart Healthy Low Carb.  Accuchecks 4 times/day, Once in AM empty stomach and then before each meal. Log in all results and show them to your Prim.MD in 3 days. If any glucose reading is under 80 or above 300 call your Prim MD immidiately. Follow Low glucose instructions for glucose under 80 as instructed.   For Heart failure patients - Check your Weight same time everyday, if you gain over 2 pounds, or you develop in leg swelling, experience more shortness of breath or chest pain, call your Primary MD immediately. Follow Cardiac Low Salt Diet and 1.5 lit/day fluid restriction.   On your next visit with your primary care physician please Get Medicines reviewed and adjusted.   Please request your Prim.MD to go over all Hospital Tests and Procedure/Radiological results at the follow up, please get all Hospital records sent to your Prim MD by signing hospital release before you go home.  If you experience worsening of your admission symptoms, develop shortness of breath, life threatening emergency, suicidal or homicidal thoughts you must seek medical attention immediately by calling 911 or calling your MD immediately  if symptoms less severe.  You Must read complete instructions/literature along with all the possible adverse reactions/side effects for all the Medicines you  take and that have been prescribed to you. Take any new Medicines after you have completely understood and accpet all the possible adverse reactions/side effects.   Do not drive, operating heavy machinery, perform activities at heights, swimming or participation in water activities or provide baby sitting services if your were admitted for syncope or siezures until you have seen by Primary MD or a Neurologist and advised to do so again.  Do not drive when taking Pain medications.    Do not take more than prescribed Pain, Sleep and Anxiety Medications  Special Instructions: If you have smoked or chewed Tobacco  in the last 2 yrs please stop smoking, stop any regular Alcohol  and or any Recreational drug use.  Wear Seat belts while driving.   Please note  You were cared for by a hospitalist during your hospital stay. If you have any questions about your discharge medications or the care you received while you were in the hospital after you are discharged, you can call the unit and asked to speak with the hospitalist on call if the hospitalist that took care of you is not available. Once you are discharged, your primary care physician will handle any further medical issues. Please note that NO REFILLS for any discharge medications will be authorized once you are discharged, as it is imperative that you return to your primary care physician (or establish a relationship with a primary care physician if you do not have one) for your aftercare needs so that they can reassess your need for medications and monitor your lab values.     Increase activity slowly    Complete by:  As directed              Discharge Medications       Medication List    TAKE these medications        acyclovir ointment 5 %  Commonly known as:  ZOVIRAX  Apply topically every 3 (three) hours.     aspirin EC 81 MG tablet  Take 81 mg by mouth daily.     BREO ELLIPTA 100-25 MCG/INH Aepb  Generic drug:  fluticasone  furoate-vilanterol  Inhale 1 puff into the lungs daily as needed (shortness of breath/ wheezing).     carvedilol 3.125 MG tablet  Commonly known as:  COREG  Take 1 tablet (3.125 mg total) by mouth 2 (two) times daily with a meal.     FLUoxetine 20 MG tablet  Commonly known as:  PROZAC  Take 60 mg by mouth daily.     furosemide 40 MG tablet  Commonly known as:  LASIX  Take 0.5 tablets (20 mg total) by mouth daily.     gabapentin 300 MG capsule  Commonly known as:  NEURONTIN  Take 300 mg by mouth at bedtime.     hydrALAZINE 25 MG tablet  Commonly known as:  APRESOLINE  Take 1 tablet (25 mg total) by mouth every 8 (eight) hours.     HYDROcodone-acetaminophen 5-325 MG tablet  Commonly known as:  NORCO/VICODIN  Take 1 tablet by mouth every 4 (four) hours as needed for moderate pain.     insulin  detemir 100 UNIT/ML injection  Commonly known as:  LEVEMIR  Inject 0.55 mLs (55 Units total) into the skin 2 (two) times daily.     levothyroxine 25 MCG tablet  Commonly known as:  SYNTHROID, LEVOTHROID  Take 2 tablets (50 mcg total) by mouth every morning.     lisinopril 5 MG tablet  Commonly known as:  PRINIVIL,ZESTRIL  Take 5 mg by mouth 2 (two) times daily.     metoCLOPramide 10 MG tablet  Commonly known as:  REGLAN  Take 10 mg by mouth 4 (four) times daily. Take 1 tablet (10 mg) by mouth 30 minutes before meals and at bedtime     NOVOLOG FLEXPEN 100 UNIT/ML FlexPen  Generic drug:  insulin aspart  Inject 0-50 Units into the skin 3 (three) times daily with meals. Per sliding scale     omeprazole 40 MG capsule  Commonly known as:  PRILOSEC  Take 40 mg by mouth at bedtime.     oxybutynin 5 MG 24 hr tablet  Commonly known as:  DITROPAN-XL  Take 5 mg by mouth daily.     rosuvastatin 20 MG tablet  Commonly known as:  CRESTOR  Take 20 mg by mouth daily.     sulfamethoxazole-trimethoprim 800-160 MG tablet  Commonly known as:  BACTRIM DS,SEPTRA DS  Take 1 tablet by mouth  every 12 (twelve) hours.     valACYclovir 1000 MG tablet  Commonly known as:  VALTREX  Take 1 tablet (1,000 mg total) by mouth 3 (three) times daily.        Major procedures and Radiology Reports - PLEASE review detailed and final reports for all details, in brief -       Ct Femur Left Wo Contrast  12/04/2015  CLINICAL DATA:  Distal left femoral rash. Nonhealing wound left foot. EXAM: CT OF THE LEFT FEMUR WITHOUT CONTRAST TECHNIQUE: Multidetector CT imaging was performed according to the standard protocol. Multiplanar CT image reconstructions were also generated. COMPARISON:  None. FINDINGS: There is subcutaneous edema and mild skin thickening involving the lateral aspect of the distal left thigh. The abnormality is confined to the skin and subcutaneous tissues. There is no intramuscular collection or inflammation. There is no soft tissue gas. There is no drainable fluid collection. A few reactive appearing nodes are present of the left inguinal region, also surrounded by mild subcutaneous stranding. No bony abnormality. IMPRESSION: The findings are most consistent with cellulitis. No soft tissue gas. No abscess. No intramuscular or bony abnormality. Electronically Signed   By: Ellery Plunk M.D.   On: 12/04/2015 21:56    Micro Results      Recent Results (from the past 240 hour(s))  Culture, blood (Routine X 2) w Reflex to ID Panel     Status: None (Preliminary result)   Collection Time: 12/04/15  8:20 PM  Result Value Ref Range Status   Specimen Description BLOOD RIGHT HAND  Final   Special Requests BOTTLES DRAWN AEROBIC AND ANAEROBIC 5CC  Final   Culture NO GROWTH 2 DAYS  Final   Report Status PENDING  Incomplete  Culture, blood (Routine X 2) w Reflex to ID Panel     Status: None (Preliminary result)   Collection Time: 12/04/15  8:30 PM  Result Value Ref Range Status   Specimen Description BLOOD RIGHT ARM  Final   Special Requests BOTTLES DRAWN AEROBIC AND ANAEROBIC 5CC   Final   Culture NO GROWTH 2 DAYS  Final   Report Status PENDING  Incomplete  MRSA PCR Screening     Status: None   Collection Time: 12/05/15  9:41 AM  Result Value Ref Range Status   MRSA by PCR NEGATIVE NEGATIVE Final    Comment:        The GeneXpert MRSA Assay (FDA approved for NASAL specimens only), is one component of a comprehensive MRSA colonization surveillance program. It is not intended to diagnose MRSA infection nor to guide or monitor treatment for MRSA infections.        Today   Subjective    Audryana Hockenberry today has no headache,no chest abdominal pain,no new weakness tingling or numbness, feels much better wants to go home today.     Objective   Blood pressure 127/84, pulse 68, temperature 97.7 F (36.5 C), temperature source Oral, resp. rate 17, height  (1.651 m), weight 96.616 kg (213 lb), SpO2 98 %.   Intake/Output Summary (Last 24 hours) at 12/07/15 0919 Last data filed at 12/07/15 0445  Gross per 24 hour  Intake    790 ml  Output   2250 ml  Net  -1460 ml    Exam Awake Alert, Oriented x 3, No new F.N deficits, Normal affect [place].AT,PERRAL Supple Neck,No JVD, No cervical lymphadenopathy appriciated.  Symmetrical Chest wall movement, Good air movement bilaterally, CTAB RRR,No Gallops,Rubs or new Murmurs, No Parasternal Heave +ve B.Sounds, Abd Soft, Non tender, No organomegaly appriciated, No rebound -guarding or rigidity. No Cyanosis, Clubbing or edema, No new Rash or bruise, Right BKA with prosthesis, left foot cellulitis above the lower one third of tibia much improved, left thigh lateral cellulitis which appears more consistent with a zoster infection is also improving with few vesicles   Data Review   CBC w Diff: Lab Results  Component Value Date   WBC 9.1 12/05/2015   HGB 10.1* 12/05/2015   HCT 30.9* 12/05/2015   PLT 178 12/05/2015   LYMPHOPCT 20 12/04/2015   MONOPCT 10 12/04/2015   EOSPCT 0 12/04/2015   BASOPCT 0 12/04/2015     CMP: Lab Results  Component Value Date   NA 138 12/06/2015   K 4.6 12/06/2015   CL 105 12/06/2015   CO2 24 12/06/2015   BUN 34* 12/06/2015   CREATININE 1.45* 12/06/2015   PROT 6.5 12/04/2015   ALBUMIN 2.2* 12/04/2015   BILITOT 0.5 12/04/2015   ALKPHOS 141* 12/04/2015   AST 23 12/04/2015   ALT 23 12/04/2015  .   Total Time in preparing paper work, data evaluation and todays exam - 35 minutes  Leroy Sea M.D on 12/07/2015 at 9:19 AM  [facility]   Office  [phone]

## 2015-12-08 DIAGNOSIS — L03116 Cellulitis of left lower limb: Secondary | ICD-10-CM | POA: Diagnosis not present

## 2015-12-08 DIAGNOSIS — E875 Hyperkalemia: Secondary | ICD-10-CM | POA: Diagnosis not present

## 2015-12-08 LAB — BASIC METABOLIC PANEL
ANION GAP: 11 (ref 5–15)
BUN: 23 mg/dL — ABNORMAL HIGH (ref 6–20)
CO2: 26 mmol/L (ref 22–32)
Calcium: 8.8 mg/dL — ABNORMAL LOW (ref 8.9–10.3)
Chloride: 105 mmol/L (ref 101–111)
Creatinine, Ser: 1.51 mg/dL — ABNORMAL HIGH (ref 0.44–1.00)
GFR calc Af Amer: 44 mL/min — ABNORMAL LOW (ref >60)
GFR, EST NON AFRICAN AMERICAN: 38 mL/min — AB (ref >60)
Glucose, Bld: 182 mg/dL — ABNORMAL HIGH (ref 65–99)
POTASSIUM: 4.9 mmol/L (ref 3.5–5.1)
SODIUM: 142 mmol/L (ref 135–145)

## 2015-12-08 LAB — GLUCOSE, CAPILLARY: GLUCOSE-CAPILLARY: 143 mg/dL — AB (ref 65–99)

## 2015-12-08 MED ORDER — DOXYCYCLINE HYCLATE 100 MG PO TABS: 100.0000 mg | ORAL_TABLET | Freq: Two times a day (BID) | ORAL | Status: DC

## 2015-12-08 MED ORDER — DOCUSATE SODIUM 100 MG PO CAPS: 200.0000 mg | ORAL_CAPSULE | Freq: Every day | ORAL | Status: DC

## 2015-12-08 MED ORDER — AMLODIPINE BESYLATE 10 MG PO TABS
10.0000 mg | ORAL_TABLET | Freq: Every day | ORAL | Status: DC
Start: 2015-12-08 — End: 2017-06-26

## 2015-12-08 NOTE — Progress Notes (Signed)
Pt discharged home in stable condition 

## 2015-12-08 NOTE — Discharge Summary (Signed)
[name], is a 55 y.o. female  DOB 02-14-1961  MRN [id number].  Admission date:  12/07/2015  Admitting Physician  Leroy Sea, MD  Discharge Date:  12/08/2015   Primary MD  [name], MD  Recommendations for primary care physician for things to follow:   Please check CBC, BMP, in 2 days, reevaluate left thigh cellulitis and herpes lesion at 2 different sites within 3-5 days   Admission Diagnosis  Hyperkalemia   Discharge Diagnosis  Hyperkalemia     Principal Problem:   Hyperkalemia Active Problems:   Diabetes mellitus type 2, uncontrolled, with complications (HCC)   TOBACCO ABUSE   Acute on chronic kidney failure (HCC)   Left leg cellulitis      Past Medical History  Diagnosis Date  . Diabetic neuropathy (HCC)   . Cellulitis   . Thyroid disease   . Hyperlipidemia   . Neuromuscular disorder (HCC)   . Concussion as a teenager    slight  . Diarrhea     pt takes Reglan tid  . Hypothyroidism     takes Synthroid daily  . Depression     takes CYmbalta daily  . MRSA (methicillin resistant staph aureus) culture positive     hx of 2012  . Staph infection 2007  . Complication of anesthesia 01/2013    "didn't know where I was; who I was; talking out the top of my head" (05-20-13)  . Hypertension   . Peripheral vascular disease (HCC)   . CHF (congestive heart failure) (HCC)   . Pneumonia 1980's    "hospitalized w/it" (05/20/13)  . Chronic bronchitis (HCC)   . Orthopnea     "just one; yesterday morning" (2013/05/20)  . Type II diabetes mellitus (HCC)     Novolog,Levemir,and Lovaza daily  . GERD (gastroesophageal reflux disease)   . Daily headache   . Rheumatoid arthritis (HCC)     "both knees" (05-20-13)  . Anxiety   . Kidney stone     "passed on before" (May 20, 2013)  . Non-healing  wound of lower extremity     lt foot  . OSA on CPAP     and oxygen  . COPD (chronic obstructive pulmonary disease) (HCC)     wear oxygen at home  . Symptomatic bradycardia 03/04/2014    Past Surgical History  Procedure Laterality Date  . Amputation    . Leg amputation below knee Right 2012  . Toe amputation Left ?2011     only 2 toes remaning.   . Tubal ligation  1990's  . Foot surgery Right 2012    "put pins in one year; amputated toes another OR" (05-20-13)  . Wrist surgery Right 1980's    "pinched nerve" (05-20-13)  . Dilation and curettage of uterus  1980's  . Refractive surgery Bilateral 2014  . Incision and drainage abscess N/A 01/24/2013    Procedure: INCISION AND DRAINAGE ABSCESS;  Surgeon: Clovis Pu. Cornett, MD;  Location: [facility] OR;  Service: General;  Laterality: N/A;  .  Inguinal hernia repair N/A 01/28/2013    Procedure: I&D Right Groin Wound;  Surgeon: Shelly Rubenstein, MD;  Location: [facility] OR;  Service: General;  Laterality: N/A;  . Ganglion cyst excision Left 1990's    "wrist" (04/21/2013)  . Bladder surgery  1970's    "stretched the mouth of my bladder" (04/21/2013)  . I&d extremity Left 08/25/2013    Procedure: IRRIGATION AND DEBRIDEMENT EXTREMITY;  Surgeon: Axel Filler, MD;  Location: [facility] OR;  Service: General;  Laterality: Left;  . Pacemaker insertion  03/05/14    Biotronik dual chamber pacemaker implanted by Dr Graciela Husbands for symptomatic bradycardia  . Permanent pacemaker insertion N/A 03/05/2014    Procedure: PERMANENT PACEMAKER INSERTION;  Surgeon: Duke Salvia, MD;  Location: [facility] CATH LAB;  Service: Cardiovascular;  Laterality: N/A;  . Stump revision Right 12/11/2014    Procedure: Right Below Knee Amputation Revision;  Surgeon: Nadara Mustard, MD;  Location: [facility] OR;  Service: Orthopedics;  Laterality: Right;      HPI  from the history and physical done on the day of admission:   [name] is a 55 y.o. female, With history of insulin-dependent poorly controlled type 2  diabetes mellitus, hypertension, chronic diastolic CHF EF 55%, obstructive sleep apnea on CPAP, COPD not using home oxygen, CKD 3, was recently admitted and discharged for left leg cellulitis and zoster rash, she was discharged this morning at 8 AM, there was a BMP which was ordered on the day of admission for 5 AM this morning but was not drawn at that time, 2 hours after her discharge BMP was drawn and resulted at 10:48 AM, potassium was noted to be high and patient's nurse was called by the lab. However at this time patient had already left the hospital. She was called back for the critical lab value of potassium 6.4. This likely happened as patient was placed on Bactrim for cellulitis causing hyperkalemia.  Patient currently is symptom free except for mild nausea, mother's bedside, hyperkalemia protocol has been initiated, she will be directly admitted for one-day observation if potassium is stable will be discharged in the morning.    Hospital Course:    1. Hyperkalemia. Likely due to Bactrim use _ CKD $ and constipation, - She was treated with full hyperkalemia protocol , her EKG was unremarkable, she was stable on telemetry, after hyperkalemia protocol which included IV D50 and insulin, IV bicarbonate, albuterol nebulizer, Kayexalate, repeat BMP has serially improved with stable potassium, Bactrim has been discontinued along with her home dose ACE inhibitor upon discharge, request PCP check BMP in 2 days along with CBC.  2. Left leg cellulitis On the lower leg, likely herpetic lesion on the left thigh with chronic left diabetic foot ulcer as below.   I do not think this ulcer is infected, she follows with Dr. Lajoyce Corners for the same, For her leg cellulitis We will place her on doxycycline for 6 more days, we'll have her follow with Dr. Lajoyce Corners within in a few days, her left outer thigh rash has now developed some vesicles, suggestive of shingles we will continue her on valacyclovir topical and oral along  with Neurontin, post discharge follow with Dr. Lajoyce Corners.   L foot 12-05-15 - cellulitis     Outer left thigh: 12-05-15 ( today has a few vesicles)       3. Chronic diastolic dysfunction EF 60%. Completely compensated.  4. Dyslipidemia. Continue home dose statin  5. Hypothyroidism. On Synthroid continue.  6. Anemia of  chronic disease. Supportive care. No need for transfusion.  7. GERD. On PPI.  8. Hypertension. On Coreg stable.  9. History of Charcot foot with diabetic neuropathy. Supportive care.   10. OSA. CPAP daily at bedtime.   11. Underlying COPD. Stable. No wheezing.   12. Ongoing smoking. Counseled to quit.   13. CKD 3 - baseline creatinine close to 1.4, is at baseline, request PCP to repeat BMP along with CBC in 2 days.   14. Constipation. Resolved with bowel regimen and soapsuds enema, placed on Colace upon discharge.   15. DM type II. On Levimir 45 units at home along with sliding scale. Monitor. Poor outpatient control. CBGs much better with Levimir increased to 55 twice a day, along with sliding scale. Monitor CBGs.      Follow UP  Follow-up Information    Follow up with TALBOT, DAVID C, MD. Schedule an appointment as soon as possible for a visit in 2 days.   Specialty:  Internal Medicine   Contact information:   [address] [place] [address] [phone]       Follow up with DUDA,MARCUS V, MD. Schedule an appointment as soon as possible for a visit in 1 week.   Specialty:  Orthopedic Surgery   Contact information:   [name] [place] [address] [phone]        Consults obtained - None  Discharge Condition: Stable  Diet and Activity recommendation: See Discharge Instructions below  Discharge Instructions           Discharge Instructions    Discharge instructions    Complete by:  As directed   Follow with Primary MD TALBOT, DAVID C, MD in 2 days   Get CBC, CMP, 2 view Chest X ray checked  by Primary  MD next visit.    Activity: As tolerated with Full fall precautions use walker/cane & assistance as needed   Disposition Home     Diet:   Heart Healthy Low Carb.  Accuchecks 4 times/day, Once in AM empty stomach and then before each meal. Log in all results and show them to your Prim.MD in 3 days. If any glucose reading is under 80 or above 300 call your Prim MD immidiately. Follow Low glucose instructions for glucose under 80 as instructed.   For Heart failure patients - Check your Weight same time everyday, if you gain over 2 pounds, or you develop in leg swelling, experience more shortness of breath or chest pain, call your Primary MD immediately. Follow Cardiac Low Salt Diet and 1.5 lit/day fluid restriction.   On your next visit with your primary care physician please Get Medicines reviewed and adjusted.   Please request your Prim.MD to go over all Hospital Tests and Procedure/Radiological results at the follow up, please get all Hospital records sent to your Prim MD by signing hospital release before you go home.   If you experience worsening of your admission symptoms, develop shortness of breath, life threatening emergency, suicidal or homicidal thoughts you must seek medical attention immediately by calling 911 or calling your MD immediately  if symptoms less severe.  You Must read complete instructions/literature along with all the possible adverse reactions/side effects for all the Medicines you take and that have been prescribed to you. Take any new Medicines after you have completely understood and accpet all the possible adverse reactions/side effects.   Do not drive, operating heavy machinery, perform activities at heights, swimming or participation in  water activities or provide baby sitting services if your were admitted for syncope or siezures until you have seen by Primary MD or a Neurologist and advised to do so again.  Do not drive when taking Pain medications.     Do not take more than prescribed Pain, Sleep and Anxiety Medications  Special Instructions: If you have smoked or chewed Tobacco  in the last 2 yrs please stop smoking, stop any regular Alcohol  and or any Recreational drug use.  Wear Seat belts while driving.   Please note  You were cared for by a hospitalist during your hospital stay. If you have any questions about your discharge medications or the care you received while you were in the hospital after you are discharged, you can call the unit and asked to speak with the hospitalist on call if the hospitalist that took care of you is not available. Once you are discharged, your primary care physician will handle any further medical issues. Please note that NO REFILLS for any discharge medications will be authorized once you are discharged, as it is imperative that you return to your primary care physician (or establish a relationship with a primary care physician if you do not have one) for your aftercare needs so that they can reassess your need for medications and monitor your lab values.     Increase activity slowly    Complete by:  As directed              Discharge Medications       Medication List    STOP taking these medications        lisinopril 5 MG tablet  Commonly known as:  PRINIVIL,ZESTRIL     sulfamethoxazole-trimethoprim 800-160 MG tablet  Commonly known as:  BACTRIM DS,SEPTRA DS      TAKE these medications        acyclovir ointment 5 %  Commonly known as:  ZOVIRAX  Apply topically every 3 (three) hours.     amLODipine 10 MG tablet  Commonly known as:  NORVASC  Take 1 tablet (10 mg total) by mouth daily.     aspirin EC 81 MG tablet  Take 81 mg by mouth daily.     BREO ELLIPTA 100-25 MCG/INH Aepb  Generic drug:  fluticasone furoate-vilanterol  Inhale 1 puff into the lungs daily as needed (shortness of breath/ wheezing).     carvedilol 3.125 MG tablet  Commonly known as:  COREG  Take 1 tablet  (3.125 mg total) by mouth 2 (two) times daily with a meal.     docusate sodium 100 MG capsule  Commonly known as:  COLACE  Take 2 capsules (200 mg total) by mouth daily.     doxycycline 100 MG tablet  Commonly known as:  VIBRA-TABS  Take 1 tablet (100 mg total) by mouth every 12 (twelve) hours.     FLUoxetine 20 MG tablet  Commonly known as:  PROZAC  Take 60 mg by mouth daily.     furosemide 40 MG tablet  Commonly known as:  LASIX  Take 0.5 tablets (20 mg total) by mouth daily.     gabapentin 300 MG capsule  Commonly known as:  NEURONTIN  Take 300 mg by mouth at bedtime.     hydrALAZINE 25 MG tablet  Commonly known as:  APRESOLINE  Take 1 tablet (25 mg total) by mouth every 8 (eight) hours.     HYDROcodone-acetaminophen 5-325 MG tablet  Commonly known as:  NORCO/VICODIN  Take 1 tablet by mouth every 4 (four) hours as needed for moderate pain.     insulin detemir 100 UNIT/ML injection  Commonly known as:  LEVEMIR  Inject 0.55 mLs (55 Units total) into the skin 2 (two) times daily.     levothyroxine 25 MCG tablet  Commonly known as:  SYNTHROID, LEVOTHROID  Take 2 tablets (50 mcg total) by mouth every morning.     metoCLOPramide 10 MG tablet  Commonly known as:  REGLAN  Take 10 mg by mouth 4 (four) times daily. Take 1 tablet (10 mg) by mouth 30 minutes before meals and at bedtime     NOVOLOG FLEXPEN 100 UNIT/ML FlexPen  Generic drug:  insulin aspart  Inject 0-50 Units into the skin 3 (three) times daily with meals. Per sliding scale     omeprazole 40 MG capsule  Commonly known as:  PRILOSEC  Take 40 mg by mouth at bedtime.     oxybutynin 5 MG 24 hr tablet  Commonly known as:  DITROPAN-XL  Take 5 mg by mouth daily.     rosuvastatin 20 MG tablet  Commonly known as:  CRESTOR  Take 20 mg by mouth daily.     valACYclovir 1000 MG tablet  Commonly known as:  VALTREX  Take 1 tablet (1,000 mg total) by mouth 3 (three) times daily.        Major procedures and  Radiology Reports - PLEASE review detailed and final reports for all details, in brief -       Ct Femur Left Wo Contrast  12/04/2015  CLINICAL DATA:  Distal left femoral rash. Nonhealing wound left foot. EXAM: CT OF THE LEFT FEMUR WITHOUT CONTRAST TECHNIQUE: Multidetector CT imaging was performed according to the standard protocol. Multiplanar CT image reconstructions were also generated. COMPARISON:  None. FINDINGS: There is subcutaneous edema and mild skin thickening involving the lateral aspect of the distal left thigh. The abnormality is confined to the skin and subcutaneous tissues. There is no intramuscular collection or inflammation. There is no soft tissue gas. There is no drainable fluid collection. A few reactive appearing nodes are present of the left inguinal region, also surrounded by mild subcutaneous stranding. No bony abnormality. IMPRESSION: The findings are most consistent with cellulitis. No soft tissue gas. No abscess. No intramuscular or bony abnormality. Electronically Signed   By: Ellery Plunk M.D.   On: 12/04/2015 21:56   Dg Abd Portable 1v  12/07/2015  CLINICAL DATA:  Nausea.  No abdominal pain. EXAM: PORTABLE ABDOMEN - 1 VIEW COMPARISON:  10/18/2015; 10/13/2015; CT abdomen pelvis - 10/14/2015 FINDINGS: Paucity of bowel gas without evidence of enteric obstruction. Moderate colonic stool burden. Nondiagnostic evaluation for pneumoperitoneum secondary supine positioning and exclusion of the lower thorax. No pneumatosis or portal venous gas. A pacer lead overlies the right ventricle. Several phleboliths overlie the lower pelvis bilaterally. Otherwise, no definitive abnormal intra-abdominal calcifications given overlying colonic stool burden. No acute osseous abnormalities. IMPRESSION: 1. Paucity of bowel gas without evidence of enteric obstruction. 2. Moderate colonic stool burden. Electronically Signed   By: Simonne Come M.D.   On: 12/07/2015 15:22    Micro Results       Recent Results (from the past 240 hour(s))  Culture, blood (Routine X 2) w Reflex to ID Panel     Status: None (Preliminary result)   Collection Time: 12/04/15  8:20 PM  Result Value Ref Range Status   Specimen Description BLOOD RIGHT HAND  Final   Special Requests  BOTTLES DRAWN AEROBIC AND ANAEROBIC 5CC  Final   Culture NO GROWTH 3 DAYS  Final   Report Status PENDING  Incomplete  Culture, blood (Routine X 2) w Reflex to ID Panel     Status: None (Preliminary result)   Collection Time: 12/04/15  8:30 PM  Result Value Ref Range Status   Specimen Description BLOOD RIGHT ARM  Final   Special Requests BOTTLES DRAWN AEROBIC AND ANAEROBIC 5CC  Final   Culture NO GROWTH 3 DAYS  Final   Report Status PENDING  Incomplete  MRSA PCR Screening     Status: None   Collection Time: 12/05/15  9:41 AM  Result Value Ref Range Status   MRSA by PCR NEGATIVE NEGATIVE Final    Comment:        The GeneXpert MRSA Assay (FDA approved for NASAL specimens only), is one component of a comprehensive MRSA colonization surveillance program. It is not intended to diagnose MRSA infection nor to guide or monitor treatment for MRSA infections.        Today   Subjective    Vanita Gensemer today has no headache,no chest abdominal pain,no new weakness tingling or numbness, feels much better wants to go home today.     Objective   Blood pressure 172/65, pulse 73, temperature 98.3 F (36.8 C), temperature source Oral, resp. rate 18, SpO2 96 %.   Intake/Output Summary (Last 24 hours) at 12/08/15 0932 Last data filed at 12/08/15 0640  Gross per 24 hour  Intake   1320 ml  Output   1900 ml  Net   -580 ml    Exam Awake Alert, Oriented x 3, No new F.N deficits, Normal affect [place].AT,PERRAL Supple Neck,No JVD, No cervical lymphadenopathy appriciated.  Symmetrical Chest wall movement, Good air movement bilaterally, CTAB RRR,No Gallops,Rubs or new Murmurs, No Parasternal Heave +ve B.Sounds, Abd Soft,  Non tender, No organomegaly appriciated, No rebound -guarding or rigidity. No Cyanosis, Clubbing or edema, No new Rash or bruise, Right BKA with prosthesis, left foot cellulitis above the lower one third of tibia much improved, left thigh lateral cellulitis which appears more consistent with a zoster infection is also improving with few vesicles   Data Review   CBC w Diff:  Lab Results  Component Value Date   WBC 9.1 12/05/2015   HGB 10.1* 12/05/2015   HCT 30.9* 12/05/2015   PLT 178 12/05/2015   LYMPHOPCT 20 12/04/2015   MONOPCT 10 12/04/2015   EOSPCT 0 12/04/2015   BASOPCT 0 12/04/2015    CMP:  Lab Results  Component Value Date   NA 142 12/08/2015   K 4.9 12/08/2015   CL 105 12/08/2015   CO2 26 12/08/2015   BUN 23* 12/08/2015   CREATININE 1.51* 12/08/2015   PROT 6.5 12/04/2015   ALBUMIN 2.2* 12/04/2015   BILITOT 0.5 12/04/2015   ALKPHOS 141* 12/04/2015   AST 23 12/04/2015   ALT 23 12/04/2015  .   Total Time in preparing paper work, data evaluation and todays exam - 35 minutes  Leroy Sea M.D on 12/08/2015 at 9:32 AM  [facility]   Office  [phone]

## 2015-12-08 NOTE — Discharge Instructions (Signed)
Follow with Primary MD TALBOT, DAVID C, MD in 2 days   Get CBC, CMP, 2 view Chest X ray checked  by Primary MD next visit.    Activity: As tolerated with Full fall precautions use walker/cane & assistance as needed   Disposition Home     Diet:   Heart Healthy Low Carb.  Accuchecks 4 times/day, Once in AM empty stomach and then before each meal. Log in all results and show them to your Prim.MD in 3 days. If any glucose reading is under 80 or above 300 call your Prim MD immidiately. Follow Low glucose instructions for glucose under 80 as instructed.   For Heart failure patients - Check your Weight same time everyday, if you gain over 2 pounds, or you develop in leg swelling, experience more shortness of breath or chest pain, call your Primary MD immediately. Follow Cardiac Low Salt Diet and 1.5 lit/day fluid restriction.   On your next visit with your primary care physician please Get Medicines reviewed and adjusted.   Please request your Prim.MD to go over all Hospital Tests and Procedure/Radiological results at the follow up, please get all Hospital records sent to your Prim MD by signing hospital release before you go home.   If you experience worsening of your admission symptoms, develop shortness of breath, life threatening emergency, suicidal or homicidal thoughts you must seek medical attention immediately by calling 911 or calling your MD immediately  if symptoms less severe.  You Must read complete instructions/literature along with all the possible adverse reactions/side effects for all the Medicines you take and that have been prescribed to you. Take any new Medicines after you have completely understood and accpet all the possible adverse reactions/side effects.   Do not drive, operating heavy machinery, perform activities at heights, swimming or participation in water activities or provide baby sitting services if your were admitted for syncope or siezures until you have  seen by Primary MD or a Neurologist and advised to do so again.  Do not drive when taking Pain medications.    Do not take more than prescribed Pain, Sleep and Anxiety Medications  Special Instructions: If you have smoked or chewed Tobacco  in the last 2 yrs please stop smoking, stop any regular Alcohol  and or any Recreational drug use.  Wear Seat belts while driving.   Please note  You were cared for by a hospitalist during your hospital stay. If you have any questions about your discharge medications or the care you received while you were in the hospital after you are discharged, you can call the unit and asked to speak with the hospitalist on call if the hospitalist that took care of you is not available. Once you are discharged, your primary care physician will handle any further medical issues. Please note that NO REFILLS for any discharge medications will be authorized once you are discharged, as it is imperative that you return to your primary care physician (or establish a relationship with a primary care physician if you do not have one) for your aftercare needs so that they can reassess your need for medications and monitor your lab values.

## 2015-12-09 LAB — CULTURE, BLOOD (ROUTINE X 2)
Culture: NO GROWTH
Culture: NO GROWTH

## 2016-01-10 ENCOUNTER — Ambulatory Visit (INDEPENDENT_AMBULATORY_CARE_PROVIDER_SITE_OTHER): Payer: Medicare HMO | Admitting: *Deleted

## 2016-01-10 DIAGNOSIS — R001 Bradycardia, unspecified: Secondary | ICD-10-CM | POA: Diagnosis not present

## 2016-01-10 NOTE — Progress Notes (Signed)
Remote pacemaker transmission.   

## 2016-01-28 LAB — CUP PACEART REMOTE DEVICE CHECK
Brady Statistic RA Percent Paced: 61 %
Implantable Lead Implant Date: 20150716
Implantable Lead Location: 753860
Lead Channel Impedance Value: 332 Ohm
Lead Channel Pacing Threshold Pulse Width: 0.4 ms
Lead Channel Sensing Intrinsic Amplitude: 2.4 mV
MDC IDC LEAD IMPLANT DT: 20150716
MDC IDC LEAD LOCATION: 753859
MDC IDC MSMT LEADCHNL RA PACING THRESHOLD AMPLITUDE: 1.1 V
MDC IDC MSMT LEADCHNL RA PACING THRESHOLD PULSEWIDTH: 0.4 ms
MDC IDC MSMT LEADCHNL RV IMPEDANCE VALUE: 449 Ohm
MDC IDC MSMT LEADCHNL RV PACING THRESHOLD AMPLITUDE: 0.7 V
MDC IDC MSMT LEADCHNL RV SENSING INTR AMPL: 9.2 mV
MDC IDC SESS DTM: 20170609122422
MDC IDC STAT BRADY RV PERCENT PACED: 1 %
Pulse Gen Model: 394929
Pulse Gen Serial Number: 68266686

## 2016-02-03 ENCOUNTER — Encounter: Payer: Self-pay | Admitting: Cardiology

## 2016-04-10 ENCOUNTER — Ambulatory Visit (INDEPENDENT_AMBULATORY_CARE_PROVIDER_SITE_OTHER): Payer: Medicare HMO | Admitting: *Deleted

## 2016-04-10 DIAGNOSIS — R001 Bradycardia, unspecified: Secondary | ICD-10-CM

## 2016-04-10 NOTE — Progress Notes (Signed)
Remote pacemaker transmission.   

## 2016-04-12 ENCOUNTER — Telehealth: Payer: Self-pay | Admitting: Cardiology

## 2016-04-12 ENCOUNTER — Encounter: Payer: Self-pay | Admitting: Cardiology

## 2016-04-12 NOTE — Telephone Encounter (Signed)
Opened in error

## 2016-04-20 LAB — CUP PACEART REMOTE DEVICE CHECK
Brady Statistic RV Percent Paced: 1 %
Date Time Interrogation Session: 20170831092541
Implantable Lead Location: 753860
Implantable Lead Model: 5076
Lead Channel Impedance Value: 332 Ohm
Lead Channel Impedance Value: 488 Ohm
Lead Channel Pacing Threshold Amplitude: 0.7 V
Lead Channel Pacing Threshold Pulse Width: 0.4 ms
Lead Channel Sensing Intrinsic Amplitude: 8.3 mV
Lead Channel Setting Pacing Amplitude: 2 V
MDC IDC LEAD IMPLANT DT: 20150716
MDC IDC LEAD IMPLANT DT: 20150716
MDC IDC LEAD LOCATION: 753859
MDC IDC MSMT LEADCHNL RA PACING THRESHOLD AMPLITUDE: 1.1 V
MDC IDC MSMT LEADCHNL RA PACING THRESHOLD PULSEWIDTH: 0.4 ms
MDC IDC MSMT LEADCHNL RA SENSING INTR AMPL: 2.5 mV
MDC IDC SET LEADCHNL RV PACING AMPLITUDE: 2.4 V
MDC IDC SET LEADCHNL RV PACING PULSEWIDTH: 0.4 ms
MDC IDC STAT BRADY RA PERCENT PACED: 69 %
Pulse Gen Model: 394929
Pulse Gen Serial Number: 68266686

## 2016-05-25 ENCOUNTER — Ambulatory Visit (INDEPENDENT_AMBULATORY_CARE_PROVIDER_SITE_OTHER): Payer: Self-pay | Admitting: Orthopedic Surgery

## 2016-05-29 ENCOUNTER — Ambulatory Visit (INDEPENDENT_AMBULATORY_CARE_PROVIDER_SITE_OTHER): Payer: Self-pay | Admitting: Orthopedic Surgery

## 2016-05-30 ENCOUNTER — Ambulatory Visit (INDEPENDENT_AMBULATORY_CARE_PROVIDER_SITE_OTHER): Payer: Medicare HMO | Admitting: Orthopedic Surgery

## 2016-05-30 DIAGNOSIS — L97421 Non-pressure chronic ulcer of left heel and midfoot limited to breakdown of skin: Secondary | ICD-10-CM | POA: Diagnosis not present

## 2016-05-30 DIAGNOSIS — E1142 Type 2 diabetes mellitus with diabetic polyneuropathy: Secondary | ICD-10-CM

## 2016-05-30 DIAGNOSIS — Z89511 Acquired absence of right leg below knee: Secondary | ICD-10-CM | POA: Diagnosis not present

## 2016-06-27 ENCOUNTER — Ambulatory Visit (INDEPENDENT_AMBULATORY_CARE_PROVIDER_SITE_OTHER): Payer: Medicare HMO | Admitting: Orthopedic Surgery

## 2016-06-27 ENCOUNTER — Encounter (INDEPENDENT_AMBULATORY_CARE_PROVIDER_SITE_OTHER): Payer: Self-pay | Admitting: Orthopedic Surgery

## 2016-06-27 VITALS — Ht 65.0 in | Wt 213.0 lb

## 2016-06-27 DIAGNOSIS — L97421 Non-pressure chronic ulcer of left heel and midfoot limited to breakdown of skin: Secondary | ICD-10-CM | POA: Diagnosis not present

## 2016-06-27 DIAGNOSIS — E11610 Type 2 diabetes mellitus with diabetic neuropathic arthropathy: Secondary | ICD-10-CM | POA: Diagnosis not present

## 2016-06-27 NOTE — Progress Notes (Signed)
Wound Care Note   Patient: [name]           Date of Birth: 1961/06/12           MRN: [id number]             PCP: Sid Falcon, MD Visit Date: 06/27/2016   Assessment & Plan: Visit Diagnoses:  1. Diabetic neurogenic arthropathy (HCC)   2. Midfoot ulceration, left, limited to breakdown of skin (HCC)     Plan: Ulcer debridement of skin soft tissue without complications follow-up in 4 weeks. Continue with her protective orthotics and custom shoe wear on the left. Patient states she has not followed up with her primary care physician to obtain the new orthotics that have been ordered.   Follow-Up Instructions: Return in about 4 weeks (around 07/25/2016).  Orders:  No orders of the defined types were placed in this encounter.  No orders of the defined types were placed in this encounter.     Procedures: No notes on file   Clinical Data: No additional findings.   No images are attached to the encounter.   Subjective: Chief Complaint  Patient presents with  . Left Foot - Wound Check  . 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 She is doing dial soap cleansing and dry dressing changes. This is moderate amount of drainage with slight odor and maceration. She is in extra depth shoes and custom orthotics.     Review of Systems   Objective: Vital Signs: Ht 5\' 5"  (1.651 m)   Wt 213 lb (96.6 kg)   BMI 35.45 kg/m   Physical Exam: Patient is alert oriented no adenopathy well-dressed normal affect normal respiratory effort.  Patient relates a wheelchair. She has a stable right transtibial amputation. 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 She has a Wagner grade 1 ulcer. After informed consent a 10 blade knife was used to debride  the skin and soft tissue back to bleeding viable granulation tissue this was touched with silver nitrate Iodosorb and a Band-Aid was applied patient tolerated this well.  Ulcer is 3 cm in diameter and 1 mm deep.  Specialty Comments: No specialty comments available.   PMFS History: Patient Active Problem List   Diagnosis Date Noted  . Hyperkalemia 12/07/2015  . Diabetic foot infection (HCC) 12/05/2015  . Herpes simplex labialis   . Altered mental status   . Thrush 10/20/2015  . HSV-1 (herpes simplex virus 1) infection   . Hallucinations   . Acute respiratory failure with hypoxia (HCC) 10/14/2015  . Hyperglycemia 10/13/2015  . Diabetes mellitus due to underlying condition with hyperosmolarity without nonketotic hyperglycemic-hyperosmolar coma [facility]) (HCC) 10/13/2015  . CKD (chronic kidney disease) stage 2, GFR 60-89 ml/min 10/13/2015  . AKI (acute kidney injury) (HCC) 10/13/2015  . Diastolic CHF (HCC) 10/13/2015  . Acute encephalopathy 10/13/2015  . HHNC (hyperglycemic hyperosmolar nonketotic coma) (HCC) 10/13/2015  . Lactic acid acidosis   . Diabetic foot ulcer (HCC) 12/08/2014  . Symptomatic bradycardia 03/04/2014  . OSA on CPAP with oxygen 03/04/2014  . COPD (chronic obstructive pulmonary disease), on home O2 03/04/2014  . Bradycardia 03/04/2014  . Open wound of knee, leg (except thigh), and ankle, complicated 09/17/2013  . Cellulitis 08/16/2013  .  Chronic diastolic heart failure (HCC) 08/16/2013  . Sepsis (HCC) 08/16/2013  . Left leg cellulitis 08/16/2013  . SOB (shortness of breath) 04/20/2013  . Proteinuria 01/26/2013  . Acute on chronic kidney failure (HCC) 01/24/2013  . Abscess of groin, right 01/23/2013  . HTN (hypertension) 01/23/2013  . Leukocytosis 01/23/2013  . Anemia of chronic disease 01/23/2013  . Hypothyroidism 01/23/2013  . Hyponatremia 10/06/2011  . HYPERCHOLESTEROLEMIA 08/17/2010  . DIABETIC FOOT ULCER, TOE 02/18/2010  . DEPRESSION 02/01/2010  .  TOBACCO ABUSE 06/04/2009  . Neuropathy (HCC) 01/30/2008  . Diabetes mellitus type 2, uncontrolled, with complications (HCC) 08/06/2007   Past Medical History:  Diagnosis Date  . Anxiety   . Cellulitis   . CHF (congestive heart failure) (HCC)   . Chronic bronchitis (HCC)   . Complication of anesthesia 01/2013   "didn't know where I was; who I was; talking out the top of my head" (Apr 24, 2013)  . Concussion as a teenager   slight  . COPD (chronic obstructive pulmonary disease) (HCC)    wear oxygen at home  . Daily headache   . Depression    takes CYmbalta daily  . Diabetic neuropathy (HCC)   . Diarrhea    pt takes Reglan tid  . GERD (gastroesophageal reflux disease)   . Hyperlipidemia   . Hypertension   . Hypothyroidism    takes Synthroid daily  . Kidney stone    "passed on before" (04/24/13)  . MRSA (methicillin resistant staph aureus) culture positive    hx of 2012  . Neuromuscular disorder (HCC)   . Non-healing wound of lower extremity    lt foot  . Orthopnea    "just one; yesterday morning" (04-24-13)  . OSA on CPAP    and oxygen  . Peripheral vascular disease (HCC)   . Pneumonia 1980's   "hospitalized w/it" (April 24, 2013)  . Rheumatoid arthritis (HCC)    "both knees" (2013/04/24)  . Staph infection 2007  . Symptomatic bradycardia 03/04/2014  . Thyroid disease   . Type II diabetes mellitus (HCC)    Novolog,Levemir,and Lovaza daily    Family History  Problem Relation Age of Onset  . Hypertension Mother   . Hyperlipidemia Mother   . Hypertrophic cardiomyopathy Mother     has pacer  . Alzheimer's disease Father   . Hypertension Brother   . Hyperlipidemia Brother   . Cancer Maternal Aunt   . Diabetes Paternal Aunt   . Diabetes Paternal Uncle   . Diabetes Paternal Grandmother   . Anesthesia problems Neg Hx   . Hypotension Neg Hx   . Malignant hyperthermia Neg Hx   . Pseudochol deficiency Neg Hx    Past Surgical History:  Procedure Laterality Date  . AMPUTATION     . BLADDER SURGERY  1970's   "stretched the mouth of my bladder" (04-24-2013)  . DILATION AND CURETTAGE OF UTERUS  1980's  . FOOT SURGERY Right 2012   "put pins in one year; amputated toes another OR" (24-Apr-2013)  . GANGLION CYST EXCISION Left 1990's   "wrist" (2013-04-24)  . I&D EXTREMITY Left 08/25/2013   Procedure: IRRIGATION AND DEBRIDEMENT EXTREMITY;  Surgeon: Axel Filler, MD;  Location: [facility] OR;  Service: General;  Laterality: Left;  . INCISION AND DRAINAGE ABSCESS N/A 01/24/2013   Procedure: INCISION AND DRAINAGE ABSCESS;  Surgeon: Clovis Pu. Cornett, MD;  Location: [facility] OR;  Service: General;  Laterality: N/A;  . INGUINAL HERNIA REPAIR N/A 01/28/2013   Procedure: I&D Right Groin Wound;  Surgeon: Riley Lam  Warnell Bureau, MD;  Location: [facility] OR;  Service: General;  Laterality: N/A;  . LEG AMPUTATION BELOW KNEE Right 2012  . PACEMAKER INSERTION  03/05/14   Biotronik dual chamber pacemaker implanted by Dr Graciela Husbands for symptomatic bradycardia  . PERMANENT PACEMAKER INSERTION N/A 03/05/2014   Procedure: PERMANENT PACEMAKER INSERTION;  Surgeon: Duke Salvia, MD;  Location: [facility] CATH LAB;  Service: Cardiovascular;  Laterality: N/A;  . REFRACTIVE SURGERY Bilateral 2014  . STUMP REVISION Right 12/11/2014   Procedure: Right Below Knee Amputation Revision;  Surgeon: Nadara Mustard, MD;  Location: [facility] OR;  Service: Orthopedics;  Laterality: Right;  . TOE AMPUTATION Left ?2011    only 2 toes remaning.   . TUBAL LIGATION  1990's  . WRIST SURGERY Right 1980's   "pinched nerve" (04/21/2013)   Social History   Occupational History  . Not on file.   Social History Main Topics  . Smoking status: Current Every Day Smoker    Packs/day: 1.00    Years: 33.00    Types: Cigarettes  . Smokeless tobacco: Never Used  . Alcohol use No  . Drug use: No  . Sexual activity: Not on file

## 2016-07-04 ENCOUNTER — Observation Stay (HOSPITAL_COMMUNITY)
Admission: EM | Admit: 2016-07-04 | Discharge: 2016-07-06 | Disposition: A | Discharge status: Home / Self Care | Payer: Medicare HMO | Attending: Internal Medicine | Admitting: Internal Medicine

## 2016-07-04 ENCOUNTER — Emergency Department (HOSPITAL_COMMUNITY): Payer: Medicare HMO

## 2016-07-04 ENCOUNTER — Encounter (HOSPITAL_COMMUNITY): Payer: Self-pay | Admitting: Emergency Medicine

## 2016-07-04 DIAGNOSIS — M069 Rheumatoid arthritis, unspecified: Secondary | ICD-10-CM | POA: Insufficient documentation

## 2016-07-04 DIAGNOSIS — E1151 Type 2 diabetes mellitus with diabetic peripheral angiopathy without gangrene: Secondary | ICD-10-CM | POA: Diagnosis not present

## 2016-07-04 DIAGNOSIS — E118 Type 2 diabetes mellitus with unspecified complications: Secondary | ICD-10-CM

## 2016-07-04 DIAGNOSIS — N1832 Chronic kidney disease, stage 3b: Secondary | ICD-10-CM | POA: Diagnosis present

## 2016-07-04 DIAGNOSIS — F329 Major depressive disorder, single episode, unspecified: Secondary | ICD-10-CM | POA: Diagnosis not present

## 2016-07-04 DIAGNOSIS — E1101 Type 2 diabetes mellitus with hyperosmolarity with coma: Secondary | ICD-10-CM | POA: Diagnosis not present

## 2016-07-04 DIAGNOSIS — Z8249 Family history of ischemic heart disease and other diseases of the circulatory system: Secondary | ICD-10-CM | POA: Insufficient documentation

## 2016-07-04 DIAGNOSIS — Z6836 Body mass index (BMI) 36.0-36.9, adult: Secondary | ICD-10-CM | POA: Insufficient documentation

## 2016-07-04 DIAGNOSIS — G459 Transient cerebral ischemic attack, unspecified: Principal | ICD-10-CM

## 2016-07-04 DIAGNOSIS — I130 Hypertensive heart and chronic kidney disease with heart failure and stage 1 through stage 4 chronic kidney disease, or unspecified chronic kidney disease: Secondary | ICD-10-CM | POA: Insufficient documentation

## 2016-07-04 DIAGNOSIS — D631 Anemia in chronic kidney disease: Secondary | ICD-10-CM | POA: Insufficient documentation

## 2016-07-04 DIAGNOSIS — F17210 Nicotine dependence, cigarettes, uncomplicated: Secondary | ICD-10-CM | POA: Insufficient documentation

## 2016-07-04 DIAGNOSIS — I639 Cerebral infarction, unspecified: Secondary | ICD-10-CM

## 2016-07-04 DIAGNOSIS — Z9989 Dependence on other enabling machines and devices: Secondary | ICD-10-CM

## 2016-07-04 DIAGNOSIS — F419 Anxiety disorder, unspecified: Secondary | ICD-10-CM | POA: Diagnosis not present

## 2016-07-04 DIAGNOSIS — Z833 Family history of diabetes mellitus: Secondary | ICD-10-CM | POA: Insufficient documentation

## 2016-07-04 DIAGNOSIS — Z880 Allergy status to penicillin: Secondary | ICD-10-CM | POA: Diagnosis not present

## 2016-07-04 DIAGNOSIS — L97509 Non-pressure chronic ulcer of other part of unspecified foot with unspecified severity: Secondary | ICD-10-CM

## 2016-07-04 DIAGNOSIS — E669 Obesity, unspecified: Secondary | ICD-10-CM | POA: Diagnosis not present

## 2016-07-04 DIAGNOSIS — E1122 Type 2 diabetes mellitus with diabetic chronic kidney disease: Secondary | ICD-10-CM | POA: Diagnosis not present

## 2016-07-04 DIAGNOSIS — K219 Gastro-esophageal reflux disease without esophagitis: Secondary | ICD-10-CM | POA: Insufficient documentation

## 2016-07-04 DIAGNOSIS — I5032 Chronic diastolic (congestive) heart failure: Secondary | ICD-10-CM | POA: Insufficient documentation

## 2016-07-04 DIAGNOSIS — I10 Essential (primary) hypertension: Secondary | ICD-10-CM | POA: Diagnosis not present

## 2016-07-04 DIAGNOSIS — E11621 Type 2 diabetes mellitus with foot ulcer: Secondary | ICD-10-CM | POA: Insufficient documentation

## 2016-07-04 DIAGNOSIS — E11628 Type 2 diabetes mellitus with other skin complications: Secondary | ICD-10-CM | POA: Insufficient documentation

## 2016-07-04 DIAGNOSIS — Z809 Family history of malignant neoplasm, unspecified: Secondary | ICD-10-CM | POA: Insufficient documentation

## 2016-07-04 DIAGNOSIS — E1142 Type 2 diabetes mellitus with diabetic polyneuropathy: Secondary | ICD-10-CM | POA: Insufficient documentation

## 2016-07-04 DIAGNOSIS — Z881 Allergy status to other antibiotic agents status: Secondary | ICD-10-CM | POA: Insufficient documentation

## 2016-07-04 DIAGNOSIS — Z950 Presence of cardiac pacemaker: Secondary | ICD-10-CM | POA: Insufficient documentation

## 2016-07-04 DIAGNOSIS — IMO0002 Reserved for concepts with insufficient information to code with codable children: Secondary | ICD-10-CM | POA: Diagnosis present

## 2016-07-04 DIAGNOSIS — E1310 Other specified diabetes mellitus with ketoacidosis without coma: Secondary | ICD-10-CM

## 2016-07-04 DIAGNOSIS — E7800 Pure hypercholesterolemia, unspecified: Secondary | ICD-10-CM | POA: Diagnosis not present

## 2016-07-04 DIAGNOSIS — E1165 Type 2 diabetes mellitus with hyperglycemia: Secondary | ICD-10-CM | POA: Diagnosis present

## 2016-07-04 DIAGNOSIS — G451 Carotid artery syndrome (hemispheric): Secondary | ICD-10-CM

## 2016-07-04 DIAGNOSIS — G4733 Obstructive sleep apnea (adult) (pediatric): Secondary | ICD-10-CM | POA: Diagnosis not present

## 2016-07-04 DIAGNOSIS — N183 Chronic kidney disease, stage 3 unspecified: Secondary | ICD-10-CM | POA: Diagnosis present

## 2016-07-04 DIAGNOSIS — Z794 Long term (current) use of insulin: Secondary | ICD-10-CM

## 2016-07-04 DIAGNOSIS — Z91041 Radiographic dye allergy status: Secondary | ICD-10-CM | POA: Insufficient documentation

## 2016-07-04 DIAGNOSIS — N179 Acute kidney failure, unspecified: Secondary | ICD-10-CM | POA: Insufficient documentation

## 2016-07-04 DIAGNOSIS — E039 Hypothyroidism, unspecified: Secondary | ICD-10-CM | POA: Diagnosis not present

## 2016-07-04 DIAGNOSIS — F17200 Nicotine dependence, unspecified, uncomplicated: Secondary | ICD-10-CM | POA: Diagnosis present

## 2016-07-04 DIAGNOSIS — Z7982 Long term (current) use of aspirin: Secondary | ICD-10-CM | POA: Insufficient documentation

## 2016-07-04 DIAGNOSIS — G458 Other transient cerebral ischemic attacks and related syndromes: Secondary | ICD-10-CM | POA: Diagnosis not present

## 2016-07-04 DIAGNOSIS — Z89511 Acquired absence of right leg below knee: Secondary | ICD-10-CM | POA: Insufficient documentation

## 2016-07-04 DIAGNOSIS — Z888 Allergy status to other drugs, medicaments and biological substances status: Secondary | ICD-10-CM | POA: Insufficient documentation

## 2016-07-04 DIAGNOSIS — D638 Anemia in other chronic diseases classified elsewhere: Secondary | ICD-10-CM | POA: Diagnosis present

## 2016-07-04 DIAGNOSIS — Z9981 Dependence on supplemental oxygen: Secondary | ICD-10-CM | POA: Insufficient documentation

## 2016-07-04 DIAGNOSIS — J449 Chronic obstructive pulmonary disease, unspecified: Secondary | ICD-10-CM | POA: Insufficient documentation

## 2016-07-04 DIAGNOSIS — Z87442 Personal history of urinary calculi: Secondary | ICD-10-CM | POA: Insufficient documentation

## 2016-07-04 LAB — COMPREHENSIVE METABOLIC PANEL
ALBUMIN: 3 g/dL — AB (ref 3.5–5.0)
ALK PHOS: 98 U/L (ref 38–126)
ALT: 17 U/L (ref 14–54)
ANION GAP: 11 (ref 5–15)
AST: 23 U/L (ref 15–41)
BILIRUBIN TOTAL: 0.3 mg/dL (ref 0.3–1.2)
BUN: 25 mg/dL — AB (ref 6–20)
CALCIUM: 9 mg/dL (ref 8.9–10.3)
CO2: 22 mmol/L (ref 22–32)
CREATININE: 1.3 mg/dL — AB (ref 0.44–1.00)
Chloride: 104 mmol/L (ref 101–111)
GFR calc Af Amer: 53 mL/min — ABNORMAL LOW (ref >60)
GFR calc non Af Amer: 46 mL/min — ABNORMAL LOW (ref >60)
GLUCOSE: 390 mg/dL — AB (ref 65–99)
Potassium: 4.3 mmol/L (ref 3.5–5.1)
Sodium: 137 mmol/L (ref 135–145)

## 2016-07-04 LAB — CBC WITH DIFFERENTIAL/PLATELET
BASOS PCT: 0 %
Basophils Absolute: 0 10*3/uL (ref 0.0–0.1)
Eosinophils Absolute: 0.1 10*3/uL (ref 0.0–0.7)
Eosinophils Relative: 1 %
HEMATOCRIT: 37.3 % (ref 36.0–46.0)
HEMOGLOBIN: 12.9 g/dL (ref 12.0–15.0)
Lymphocytes Relative: 39 %
Lymphs Abs: 2.4 10*3/uL (ref 0.7–4.0)
MCH: 28.5 pg (ref 26.0–34.0)
MCHC: 34.6 g/dL (ref 30.0–36.0)
MCV: 82.5 fL (ref 78.0–100.0)
MONOS PCT: 2 %
Monocytes Absolute: 0.1 10*3/uL (ref 0.1–1.0)
NEUTROS ABS: 3.6 10*3/uL (ref 1.7–7.7)
NEUTROS PCT: 58 %
Platelets: 181 10*3/uL (ref 150–400)
RBC: 4.52 MIL/uL (ref 3.87–5.11)
RDW: 13.7 % (ref 11.5–15.5)
WBC: 6.2 10*3/uL (ref 4.0–10.5)

## 2016-07-04 LAB — GLUCOSE, CAPILLARY
Glucose-Capillary: 278 mg/dL — ABNORMAL HIGH (ref 65–99)
Glucose-Capillary: 276 mg/dL — ABNORMAL HIGH (ref 65–99)

## 2016-07-04 LAB — CBG MONITORING, ED
GLUCOSE-CAPILLARY: 394 mg/dL — AB (ref 65–99)
Glucose-Capillary: 288 mg/dL — ABNORMAL HIGH (ref 65–99)

## 2016-07-04 LAB — I-STAT TROPONIN, ED: Troponin i, poc: 0 ng/mL (ref 0.00–0.08)

## 2016-07-04 MED ORDER — PANTOPRAZOLE SODIUM 40 MG PO TBEC
40.0000 mg | DELAYED_RELEASE_TABLET | Freq: Every day | ORAL | Status: DC
  Administered 2016-07-04 – 2016-07-06 (×3): 40 mg via ORAL
  Filled 2016-07-04 (×3): qty 1

## 2016-07-04 MED ORDER — ONDANSETRON HCL 4 MG/2ML IJ SOLN: 4.0000 mg | Freq: Four times a day (QID) | INTRAMUSCULAR | Status: DC | PRN

## 2016-07-04 MED ORDER — ENOXAPARIN SODIUM 40 MG/0.4ML ~~LOC~~ SOLN
40.0000 mg | SUBCUTANEOUS | Status: DC
  Administered 2016-07-04 – 2016-07-05 (×2): 40 mg via SUBCUTANEOUS
  Filled 2016-07-04 (×2): qty 0.4

## 2016-07-04 MED ORDER — ACETAMINOPHEN 650 MG RE SUPP: 650.0000 mg | Freq: Four times a day (QID) | RECTAL | Status: DC | PRN

## 2016-07-04 MED ORDER — FUROSEMIDE 20 MG PO TABS
20.0000 mg | ORAL_TABLET | Freq: Every day | ORAL | Status: DC
  Administered 2016-07-05: 20 mg via ORAL
  Filled 2016-07-04: qty 1

## 2016-07-04 MED ORDER — SODIUM CHLORIDE 0.9% FLUSH: 3.0000 mL | INTRAVENOUS | Status: DC | PRN

## 2016-07-04 MED ORDER — SODIUM CHLORIDE 0.9 % IV BOLUS (SEPSIS)
1000.0000 mL | Freq: Once | INTRAVENOUS | Status: AC
  Administered 2016-07-04: 1000 mL via INTRAVENOUS

## 2016-07-04 MED ORDER — OXYBUTYNIN CHLORIDE ER 10 MG PO TB24
10.0000 mg | ORAL_TABLET | Freq: Every day | ORAL | Status: DC
  Administered 2016-07-05 – 2016-07-06 (×2): 10 mg via ORAL
  Filled 2016-07-04 (×2): qty 1

## 2016-07-04 MED ORDER — METOCLOPRAMIDE HCL 10 MG PO TABS
10.0000 mg | ORAL_TABLET | Freq: Three times a day (TID) | ORAL | Status: DC
  Administered 2016-07-04 – 2016-07-05 (×3): 10 mg via ORAL
  Filled 2016-07-04 (×3): qty 1

## 2016-07-04 MED ORDER — ONDANSETRON HCL 4 MG PO TABS: 4.0000 mg | ORAL_TABLET | Freq: Four times a day (QID) | ORAL | Status: DC | PRN

## 2016-07-04 MED ORDER — SODIUM CHLORIDE 0.9% FLUSH
3.0000 mL | Freq: Two times a day (BID) | INTRAVENOUS | Status: DC
  Administered 2016-07-05 – 2016-07-06 (×2): 3 mL via INTRAVENOUS

## 2016-07-04 MED ORDER — ROSUVASTATIN CALCIUM 20 MG PO TABS
40.0000 mg | ORAL_TABLET | Freq: Every day | ORAL | Status: DC
  Administered 2016-07-05 – 2016-07-06 (×2): 40 mg via ORAL
  Filled 2016-07-04 (×2): qty 2

## 2016-07-04 MED ORDER — SODIUM CHLORIDE 0.9 % IV SOLN: 250.0000 mL | INTRAVENOUS | Status: DC | PRN

## 2016-07-04 MED ORDER — ACETAMINOPHEN 325 MG PO TABS: 650.0000 mg | ORAL_TABLET | Freq: Four times a day (QID) | ORAL | Status: DC | PRN

## 2016-07-04 MED ORDER — SODIUM CHLORIDE 0.9% FLUSH
3.0000 mL | Freq: Two times a day (BID) | INTRAVENOUS | Status: DC
  Administered 2016-07-04 – 2016-07-06 (×2): 3 mL via INTRAVENOUS

## 2016-07-04 MED ORDER — ASPIRIN EC 81 MG PO TBEC
81.0000 mg | DELAYED_RELEASE_TABLET | Freq: Every day | ORAL | Status: DC
  Administered 2016-07-04 – 2016-07-05 (×2): 81 mg via ORAL
  Filled 2016-07-04 (×2): qty 1

## 2016-07-04 MED ORDER — LISINOPRIL 5 MG PO TABS
5.0000 mg | ORAL_TABLET | Freq: Every day | ORAL | Status: DC
  Administered 2016-07-05 – 2016-07-06 (×2): 5 mg via ORAL
  Filled 2016-07-04 (×2): qty 1

## 2016-07-04 MED ORDER — INSULIN ASPART 100 UNIT/ML ~~LOC~~ SOLN
0.0000 [IU] | Freq: Three times a day (TID) | SUBCUTANEOUS | Status: DC
  Administered 2016-07-05 (×2): 7 [IU] via SUBCUTANEOUS
  Administered 2016-07-05: 9 [IU] via SUBCUTANEOUS
  Administered 2016-07-06: 7 [IU] via SUBCUTANEOUS
  Administered 2016-07-06: 3 [IU] via SUBCUTANEOUS
  Administered 2016-07-06: 5 [IU] via SUBCUTANEOUS

## 2016-07-04 MED ORDER — LEVOTHYROXINE SODIUM 50 MCG PO TABS
50.0000 ug | ORAL_TABLET | Freq: Every day | ORAL | Status: DC
  Administered 2016-07-05 – 2016-07-06 (×2): 50 ug via ORAL
  Filled 2016-07-04 (×3): qty 1

## 2016-07-04 MED ORDER — INSULIN ASPART 100 UNIT/ML IV SOLN
10.0000 [IU] | Freq: Once | INTRAVENOUS | Status: AC
  Administered 2016-07-04: 10 [IU] via INTRAVENOUS
  Filled 2016-07-04: qty 1

## 2016-07-04 MED ORDER — INSULIN GLARGINE 100 UNIT/ML ~~LOC~~ SOLN
20.0000 [IU] | Freq: Two times a day (BID) | SUBCUTANEOUS | Status: DC
  Administered 2016-07-04 – 2016-07-06 (×4): 20 [IU] via SUBCUTANEOUS
  Filled 2016-07-04 (×6): qty 0.2

## 2016-07-04 MED ORDER — FLUOXETINE HCL 20 MG PO CAPS
60.0000 mg | ORAL_CAPSULE | Freq: Every day | ORAL | Status: DC
  Administered 2016-07-05 – 2016-07-06 (×2): 60 mg via ORAL
  Filled 2016-07-04 (×3): qty 3

## 2016-07-04 MED ORDER — CARVEDILOL 3.125 MG PO TABS
3.1250 mg | ORAL_TABLET | Freq: Two times a day (BID) | ORAL | Status: DC
  Administered 2016-07-04 – 2016-07-06 (×5): 3.125 mg via ORAL
  Filled 2016-07-04 (×5): qty 1

## 2016-07-04 MED ORDER — STROKE: EARLY STAGES OF RECOVERY BOOK: Freq: Once | Status: DC

## 2016-07-04 NOTE — ED Provider Notes (Signed)
[facility]-EMERGENCY DEPT Provider Note   CSN: [id number] Arrival date & time: 07/04/16  1233     History   Chief Complaint Chief Complaint  Patient presents with  . Stroke Symptoms    HPI [name] is a 55 y.o. female.  HPI Patient presents complaining of multiple strokes for the past 4 days. States she is having 1 episode per day. Describes episodes as right upper, lower extremity weakness and slurred speech. Symptoms last roughly 5-10 minutes and then resolved completely. Last episode was at 1 AM this morning. Denies any chest pain or shortness of breath. Denies any visual changes. Past Medical History:  Diagnosis Date  . Anxiety   . Cellulitis   . CHF (congestive heart failure) (HCC)   . Chronic bronchitis (HCC)   . Complication of anesthesia 01/2013   "didn't know where I was; who I was; talking out the top of my head" (May 04, 2013)  . Concussion as a teenager   slight  . COPD (chronic obstructive pulmonary disease) (HCC)    wear oxygen at home  . Daily headache   . Depression    takes CYmbalta daily  . Diabetic neuropathy (HCC)   . Diarrhea    pt takes Reglan tid  . GERD (gastroesophageal reflux disease)   . Hyperlipidemia   . Hypertension   . Hypothyroidism    takes Synthroid daily  . Kidney stone    "passed on before" (May 04, 2013)  . MRSA (methicillin resistant staph aureus) culture positive    hx of 2012  . Neuromuscular disorder (HCC)   . Non-healing wound of lower extremity    lt foot  . Orthopnea    "just one; yesterday morning" (05/04/2013)  . OSA on CPAP    and oxygen  . Peripheral vascular disease (HCC)   . Pneumonia 1980's   "hospitalized w/it" (May 04, 2013)  . Rheumatoid arthritis (HCC)    "both knees" (May 04, 2013)  . Staph infection 2007  . Symptomatic bradycardia 03/04/2014  . Thyroid disease   . Type II diabetes mellitus (HCC)    Novolog,Levemir,and Lovaza daily    Patient Active Problem List   Diagnosis Date Noted  . TIA (transient ischemic  attack) 07/04/2016  . Hyperkalemia 12/07/2015  . Diabetic foot infection (HCC) 12/05/2015  . Herpes simplex labialis   . Altered mental status   . Thrush 10/20/2015  . HSV-1 (herpes simplex virus 1) infection   . Hallucinations   . Acute respiratory failure with hypoxia (HCC) 10/14/2015  . Hyperglycemia 10/13/2015  . Diabetes mellitus due to underlying condition with hyperosmolarity without nonketotic hyperglycemic-hyperosmolar coma [facility]) (HCC) 10/13/2015  . CKD (chronic kidney disease) stage 2, GFR 60-89 ml/min 10/13/2015  . AKI (acute kidney injury) (HCC) 10/13/2015  . Diastolic CHF (HCC) 10/13/2015  . Acute encephalopathy 10/13/2015  . HHNC (hyperglycemic hyperosmolar nonketotic coma) (HCC) 10/13/2015  . Lactic acid acidosis   . Diabetic foot ulcer (HCC) 12/08/2014  . Symptomatic bradycardia 03/04/2014  . OSA on CPAP with oxygen 03/04/2014  . COPD (chronic obstructive pulmonary disease), on home O2 03/04/2014  . Bradycardia 03/04/2014  . Open wound of knee, leg (except thigh), and ankle, complicated 09/17/2013  . Cellulitis 08/16/2013  . Chronic diastolic heart failure (HCC) 08/16/2013  . Sepsis (HCC) 08/16/2013  . Left leg cellulitis 08/16/2013  . SOB (shortness of breath) 04/20/2013  . Proteinuria 01/26/2013  . Acute on chronic kidney failure (HCC) 01/24/2013  . Abscess of groin, right 01/23/2013  . HTN (hypertension) 01/23/2013  . Leukocytosis 01/23/2013  .  Anemia of chronic disease 01/23/2013  . Hypothyroidism 01/23/2013  . Hyponatremia 10/06/2011  . HYPERCHOLESTEROLEMIA 08/17/2010  . DIABETIC FOOT ULCER, TOE 02/18/2010  . DEPRESSION 02/01/2010  . TOBACCO ABUSE 06/04/2009  . Neuropathy (HCC) 01/30/2008  . Diabetes mellitus type 2, uncontrolled, with complications (HCC) 08/06/2007    Past Surgical History:  Procedure Laterality Date  . AMPUTATION    . BLADDER SURGERY  1970's   "stretched the mouth of my bladder" (04/21/2013)  . DILATION AND CURETTAGE OF UTERUS   1980's  . FOOT SURGERY Right 2012   "put pins in one year; amputated toes another OR" (04/21/2013)  . GANGLION CYST EXCISION Left 1990's   "wrist" (04/21/2013)  . I&D EXTREMITY Left 08/25/2013   Procedure: IRRIGATION AND DEBRIDEMENT EXTREMITY;  Surgeon: Axel Filler, MD;  Location: [facility] OR;  Service: General;  Laterality: Left;  . INCISION AND DRAINAGE ABSCESS N/A 01/24/2013   Procedure: INCISION AND DRAINAGE ABSCESS;  Surgeon: Clovis Pu. Cornett, MD;  Location: [facility] OR;  Service: General;  Laterality: N/A;  . INGUINAL HERNIA REPAIR N/A 01/28/2013   Procedure: I&D Right Groin Wound;  Surgeon: Shelly Rubenstein, MD;  Location: [facility] OR;  Service: General;  Laterality: N/A;  . LEG AMPUTATION BELOW KNEE Right 2012  . PACEMAKER INSERTION  03/05/14   Biotronik dual chamber pacemaker implanted by Dr Graciela Husbands for symptomatic bradycardia  . PERMANENT PACEMAKER INSERTION N/A 03/05/2014   Procedure: PERMANENT PACEMAKER INSERTION;  Surgeon: Duke Salvia, MD;  Location: [facility] CATH LAB;  Service: Cardiovascular;  Laterality: N/A;  . REFRACTIVE SURGERY Bilateral 2014  . STUMP REVISION Right 12/11/2014   Procedure: Right Below Knee Amputation Revision;  Surgeon: Nadara Mustard, MD;  Location: [facility] OR;  Service: Orthopedics;  Laterality: Right;  . TOE AMPUTATION Left ?2011    only 2 toes remaning.   . TUBAL LIGATION  1990's  . WRIST SURGERY Right 1980's   "pinched nerve" (04/21/2013)    OB History    No data available       Home Medications    Prior to Admission medications   Medication Sig Start Date End Date Taking? Authorizing Provider  acyclovir ointment (ZOVIRAX) 5 % Apply topically every 3 (three) hours. Patient not taking: Reported on 06/27/2016 12/07/15   Leroy Sea, MD  amLODipine (NORVASC) 10 MG tablet Take 1 tablet (10 mg total) by mouth daily. 12/08/15   Leroy Sea, MD  aspirin EC 81 MG tablet Take 81 mg by mouth daily.    Historical Provider, MD  carvedilol (COREG) 3.125 MG tablet Take 1 tablet  (3.125 mg total) by mouth 2 (two) times daily with a meal. 10/25/15   Rolly Salter, MD  docusate sodium (COLACE) 100 MG capsule Take 2 capsules (200 mg total) by mouth daily. Patient not taking: Reported on 06/27/2016 12/08/15   Leroy Sea, MD  doxycycline (VIBRA-TABS) 100 MG tablet Take 1 tablet (100 mg total) by mouth every 12 (twelve) hours. Patient not taking: Reported on 06/27/2016 12/08/15   Leroy Sea, MD  FLUoxetine (PROZAC) 20 MG tablet Take 60 mg by mouth daily. 11/24/15   Historical Provider, MD  fluticasone furoate-vilanterol (BREO ELLIPTA) 100-25 MCG/INH AEPB Inhale 1 puff into the lungs daily as needed (shortness of breath/ wheezing).    Historical Provider, MD  furosemide (LASIX) 40 MG tablet Take 0.5 tablets (20 mg total) by mouth daily. 10/25/15   Rolly Salter, MD  gabapentin (NEURONTIN) 300 MG capsule Take 300 mg by mouth  at bedtime. 12/01/15   Historical Provider, MD  hydrALAZINE (APRESOLINE) 25 MG tablet Take 1 tablet (25 mg total) by mouth every 8 (eight) hours. 10/25/15   Rolly Salter, MD  HYDROcodone-acetaminophen (NORCO/VICODIN) 5-325 MG tablet Take 1 tablet by mouth every 4 (four) hours as needed for moderate pain. Patient not taking: Reported on 06/27/2016 12/07/15   Leroy Sea, MD  insulin aspart (NOVOLOG FLEXPEN) 100 UNIT/ML FlexPen Inject 0-50 Units into the skin 3 (three) times daily with meals. Per sliding scale    Historical Provider, MD  insulin detemir (LEVEMIR) 100 UNIT/ML injection Inject 0.55 mLs (55 Units total) into the skin 2 (two) times daily. 12/07/15   Leroy Sea, MD  levothyroxine (SYNTHROID, LEVOTHROID) 25 MCG tablet Take 2 tablets (50 mcg total) by mouth every morning. 10/25/15   Rolly Salter, MD  metoCLOPramide (REGLAN) 10 MG tablet Take 10 mg by mouth 4 (four) times daily. Take 1 tablet (10 mg) by mouth 30 minutes before meals and at bedtime 11/25/15   Historical Provider, MD  omeprazole (PRILOSEC) 40 MG capsule Take 40 mg by mouth at  bedtime. 11/22/15   Historical Provider, MD  oxybutynin (DITROPAN-XL) 5 MG 24 hr tablet Take 5 mg by mouth daily. 11/22/15   Historical Provider, MD  rosuvastatin (CRESTOR) 20 MG tablet Take 20 mg by mouth daily. 11/22/15   Historical Provider, MD  valACYclovir (VALTREX) 1000 MG tablet Take 1 tablet (1,000 mg total) by mouth 3 (three) times daily. Patient not taking: Reported on 06/27/2016 12/07/15   Leroy Sea, MD    Family History Family History  Problem Relation Age of Onset  . Hypertension Mother   . Hyperlipidemia Mother   . Hypertrophic cardiomyopathy Mother     has pacer  . Alzheimer's disease Father   . Hypertension Brother   . Hyperlipidemia Brother   . Cancer Maternal Aunt   . Diabetes Paternal Aunt   . Diabetes Paternal Uncle   . Diabetes Paternal Grandmother   . Anesthesia problems Neg Hx   . Hypotension Neg Hx   . Malignant hyperthermia Neg Hx   . Pseudochol deficiency Neg Hx     Social History Social History  Substance Use Topics  . Smoking status: Current Every Day Smoker    Packs/day: 1.00    Years: 33.00    Types: Cigarettes  . Smokeless tobacco: Never Used  . Alcohol use No     Allergies   Erythromycin; Iodine-131; Gadolinium; Ivp dye [iodinated diagnostic agents]; Metformin; and Penicillins   Review of Systems Review of Systems  Constitutional: Negative for chills and fever.  HENT: Negative for facial swelling, trouble swallowing and voice change.   Eyes: Negative for visual disturbance.  Respiratory: Negative for cough and shortness of breath.   Cardiovascular: Negative for chest pain.  Gastrointestinal: Negative for abdominal pain, diarrhea, nausea and vomiting.  Musculoskeletal: Negative for back pain, myalgias, neck pain and neck stiffness.  Skin: Negative for rash and wound.  Neurological: Positive for speech difficulty. Negative for dizziness, weakness, light-headedness, numbness and headaches.  All other systems reviewed and are  negative.    Physical Exam Updated Vital Signs BP 126/61   Pulse 66   Temp 98.2 F (36.8 C) (Oral)   Resp 16   Ht 5\' 5"  (1.651 m)   Wt 218 lb (98.9 kg)   SpO2 95%   BMI 36.28 kg/m   Physical Exam  Constitutional: She is oriented to person, place, and time. She appears  well-developed and well-nourished.  HENT:  Head: Normocephalic and atraumatic.  Mouth/Throat: Oropharynx is clear and moist.  Eyes: EOM are normal. Pupils are equal, round, and reactive to light.  Neck: Normal range of motion. Neck supple.  Cardiovascular: Normal rate and regular rhythm.  Exam reveals no gallop and no friction rub.   No murmur heard. Pulmonary/Chest: Effort normal and breath sounds normal.  Abdominal: Soft. Bowel sounds are normal. There is no tenderness. There is no rebound and no guarding.  Musculoskeletal: Normal range of motion. She exhibits no edema or tenderness.  Right-sided BKA  Neurological: She is alert and oriented to person, place, and time.  Patient is alert and oriented x3 with clear, goal oriented speech. Patient has 5/5 motor in all extremities. Sensation is intact to light touch. Bilateral finger-to-nose is normal with no signs of dysmetria.   Skin: Skin is warm and dry. Capillary refill takes less than 2 seconds. No rash noted. No erythema.  Psychiatric: She has a normal mood and affect. Her behavior is normal.  Nursing note and vitals reviewed.    ED Treatments / Results  Labs (all labs ordered are listed, but only abnormal results are displayed) Labs Reviewed  COMPREHENSIVE METABOLIC PANEL - Abnormal; Notable for the following:       Result Value   Glucose, Bld 390 (*)    BUN 25 (*)    Creatinine, Ser 1.30 (*)    Albumin 3.0 (*)    GFR calc non Af Amer 46 (*)    GFR calc Af Amer 53 (*)    All other components within normal limits  CBG MONITORING, ED - Abnormal; Notable for the following:    Glucose-Capillary 394 (*)    All other components within normal limits   CBG MONITORING, ED - Abnormal; Notable for the following:    Glucose-Capillary 288 (*)    All other components within normal limits  CBC WITH DIFFERENTIAL/PLATELET  Rosezena SensorI-STAT TROPOININ, ED    EKG  EKG Interpretation  Date/Time:  Tuesday July 04 2016 12:49:38 EST Ventricular Rate:  68 PR Interval:    QRS Duration: 95 QT Interval:  448 QTC Calculation: 477 R Axis:   -67 Text Interpretation:  Sinus rhythm Left anterior fascicular block Abnormal R-wave progression, late transition LVH with secondary repolarization abnormality Confirmed by Ranae PalmsYELVERTON  MD, Mikeyla Music ([id number]) on 07/04/2016 1:45:39 PM       Radiology Ct Head Wo Contrast  Result Date: 07/04/2016 CLINICAL DATA:  Four day history of right-sided arm and leg weakness. Slurred speech. EXAM: CT HEAD WITHOUT CONTRAST TECHNIQUE: Contiguous axial images were obtained from the base of the skull through the vertex without intravenous contrast. COMPARISON:  October 21, 2015 FINDINGS: Brain: Mild atrophy for age is stable. There is no intracranial mass, hemorrhage, extra-axial fluid collection, or midline shift. There is mild patchy small vessel disease in the centra semiovale bilaterally. Elsewhere gray-white compartments appear unremarkable and stable. No acute infarct is evident on this study. Vascular: There is no hyperdense vessel. There is mild calcification in the distal carotid siphon region on the left. Skull: Bony calvarium appears intact. Sinuses/Orbits: Visualized paranasal sinuses are clear. Orbits appear symmetric bilaterally Other: Mastoid air cells are clear. IMPRESSION: Mild atrophy with mild patchy periventricular small vessel disease, stable. No acute infarct evident. No mass, hemorrhage, or extra-axial fluid collection. There is mild calcification in the distal left carotid siphon region. Electronically Signed   By: Bretta BangWilliam  Woodruff III M.D.   On: 07/04/2016 15:30  Procedures Procedures (including critical care  time)  Medications Ordered in ED Medications  insulin aspart (novoLOG) injection 10 Units (10 Units Intravenous Given 07/04/16 1309)  sodium chloride 0.9 % bolus 1,000 mL (0 mLs Intravenous Stopped 07/04/16 1528)     Initial Impression / Assessment and Plan / ED Course  I have reviewed the triage vital signs and the nursing notes.  Pertinent labs & imaging results that were available during my care of the patient were reviewed by me and considered in my medical decision making (see chart for details).  Clinical Course    Patient maintained a normal neurologic exam. CT head without any acute infarct. Discussed with hospitalist and we'll admit for TIA workup.   Final Clinical Impressions(s) / ED Diagnoses   Final diagnoses:  Transient cerebral ischemia, unspecified type    New Prescriptions New Prescriptions   No medications on file     Loren Racer, MD 07/04/16 [phone]

## 2016-07-04 NOTE — Consult Note (Signed)
Neurology Consult Note  Reason for Consultation: TIA  Requesting provider: Erin HearingMauricio Arrien, MD  CC: intermittent right-sided weakness and slurred speech  HPI: This is a 55 year old right-handed woman who presents for evaluation of right-sided weakness. History is obtained directly from the patient was Historian.  The patient reports that over the past 4 days, she has had intermittent episodes of weakness involving the right side of her body. This involves both the arm and leg but she has not appreciated any symptoms in her face. However, the weakness is accompanied by some slurred speech. She reports one episode over each of the past 4 days. These range in duration from five to ten minutes and completely resolve in between. There is no clear provocation for the symptoms and they seem to occur without any clear association to time of day or activity. She has never had similar symptoms in the past. She denies any prior history of stroke. She is presently asymptomatic.  PMH:  Past Medical History:  Diagnosis Date  . Anxiety   . Cellulitis   . CHF (congestive heart failure) (HCC)   . Chronic bronchitis (HCC)   . Complication of anesthesia 01/2013   "didn't know where I was; who I was; talking out the top of my head" (04/21/2013)  . Concussion as a teenager   slight  . COPD (chronic obstructive pulmonary disease) (HCC)    wear oxygen at home  . Daily headache   . Depression    takes CYmbalta daily  . Diabetic neuropathy (HCC)   . Diarrhea    pt takes Reglan tid  . GERD (gastroesophageal reflux disease)   . Hyperlipidemia   . Hypertension   . Hypothyroidism    takes Synthroid daily  . Kidney stone    "passed on before" (04/21/2013)  . MRSA (methicillin resistant staph aureus) culture positive    hx of 2012  . Neuromuscular disorder (HCC)   . Non-healing wound of lower extremity    lt foot  . Orthopnea    "just one; yesterday morning" (04/21/2013)  . OSA on CPAP    and oxygen  .  Peripheral vascular disease (HCC)   . Pneumonia 1980's   "hospitalized w/it" (04/21/2013)  . Rheumatoid arthritis (HCC)    "both knees" (04/21/2013)  . Staph infection 2007  . Symptomatic bradycardia 03/04/2014  . Thyroid disease   . Type II diabetes mellitus (HCC)    Novolog,Levemir,and Lovaza daily    PSH:  Past Surgical History:  Procedure Laterality Date  . AMPUTATION    . BLADDER SURGERY  1970's   "stretched the mouth of my bladder" (04/21/2013)  . DILATION AND CURETTAGE OF UTERUS  1980's  . FOOT SURGERY Right 2012   "put pins in one year; amputated toes another OR" (04/21/2013)  . GANGLION CYST EXCISION Left 1990's   "wrist" (04/21/2013)  . I&D EXTREMITY Left 08/25/2013   Procedure: IRRIGATION AND DEBRIDEMENT EXTREMITY;  Surgeon: Axel FillerArmando Ramirez, MD;  Location: [facility] OR;  Service: General;  Laterality: Left;  . INCISION AND DRAINAGE ABSCESS N/A 01/24/2013   Procedure: INCISION AND DRAINAGE ABSCESS;  Surgeon: Clovis Puhomas A. Cornett, MD;  Location: [facility] OR;  Service: General;  Laterality: N/A;  . INGUINAL HERNIA REPAIR N/A 01/28/2013   Procedure: I&D Right Groin Wound;  Surgeon: Shelly Rubensteinouglas A Blackman, MD;  Location: [facility] OR;  Service: General;  Laterality: N/A;  . LEG AMPUTATION BELOW KNEE Right 2012  . PACEMAKER INSERTION  03/05/14   Biotronik dual chamber pacemaker implanted by Dr  Graciela Husbands for symptomatic bradycardia  . PERMANENT PACEMAKER INSERTION N/A 03/05/2014   Procedure: PERMANENT PACEMAKER INSERTION;  Surgeon: Duke Salvia, MD;  Location: [facility] CATH LAB;  Service: Cardiovascular;  Laterality: N/A;  . REFRACTIVE SURGERY Bilateral 2014  . STUMP REVISION Right 12/11/2014   Procedure: Right Below Knee Amputation Revision;  Surgeon: Nadara Mustard, MD;  Location: [facility] OR;  Service: Orthopedics;  Laterality: Right;  . TOE AMPUTATION Left ?2011    only 2 toes remaning.   . TUBAL LIGATION  1990's  . WRIST SURGERY Right 1980's   "pinched nerve" (04/21/2013)    Family history: Family History  Problem Relation  Age of Onset  . Hypertension Mother   . Hyperlipidemia Mother   . Hypertrophic cardiomyopathy Mother     has pacer  . Alzheimer's disease Father   . Hypertension Brother   . Hyperlipidemia Brother   . Cancer Maternal Aunt   . Diabetes Paternal Aunt   . Diabetes Paternal Uncle   . Diabetes Paternal Grandmother   . Anesthesia problems Neg Hx   . Hypotension Neg Hx   . Malignant hyperthermia Neg Hx   . Pseudochol deficiency Neg Hx     Social history:  Social History   Social History  . Marital status: Single    Spouse name: N/A  . Number of children: N/A  . Years of education: N/A   Occupational History  . Not on file.   Social History Main Topics  . Smoking status: Current Every Day Smoker    Packs/day: 1.00    Years: 33.00    Types: Cigarettes  . Smokeless tobacco: Never Used  . Alcohol use No  . Drug use: No  . Sexual activity: Not on file   Other Topics Concern  . Not on file   Social History Narrative  . No narrative on file    Current outpatient meds: Current Meds  Medication Sig  . carvedilol (COREG) 3.125 MG tablet Take 1 tablet (3.125 mg total) by mouth 2 (two) times daily with a meal.  . FLUoxetine (PROZAC) 20 MG tablet Take 60 mg by mouth daily.  . furosemide (LASIX) 40 MG tablet Take 0.5 tablets (20 mg total) by mouth daily.  . insulin aspart (NOVOLOG FLEXPEN) 100 UNIT/ML FlexPen Inject 0-50 Units into the skin 3 (three) times daily with meals. Per sliding scale  . insulin detemir (LEVEMIR) 100 UNIT/ML injection Inject 0.55 mLs (55 Units total) into the skin 2 (two) times daily. (Patient taking differently: Inject 45 Units into the skin 2 (two) times daily. )  . levothyroxine (SYNTHROID, LEVOTHROID) 25 MCG tablet Take 2 tablets (50 mcg total) by mouth every morning.  Marland Kitchen lisinopril (PRINIVIL,ZESTRIL) 5 MG tablet Take 5 mg by mouth daily.  . metoCLOPramide (REGLAN) 10 MG tablet Take 10 mg by mouth 4 (four) times daily. Take 1 tablet (10 mg) by mouth 30  minutes before meals and at bedtime  . omeprazole (PRILOSEC) 40 MG capsule Take 40 mg by mouth at bedtime.  Marland Kitchen oxybutynin (DITROPAN-XL) 5 MG 24 hr tablet Take 10 mg by mouth daily.   . rosuvastatin (CRESTOR) 20 MG tablet Take 40 mg by mouth daily.     Current inpatient meds:  Current Facility-Administered Medications  Medication Dose Route Frequency Provider Last Rate Last Dose  .  stroke: mapping our early stages of recovery book   Does not apply Once Coralie Keens, MD      . 0.9 %  sodium chloride infusion  250 mL Intravenous PRN Coralie Keens, MD      . acetaminophen (TYLENOL) tablet 650 mg  650 mg Oral Q6H PRN Coralie Keens, MD       Or  . acetaminophen (TYLENOL) suppository 650 mg  650 mg Rectal Q6H PRN Coralie Keens, MD      . aspirin EC tablet 81 mg  81 mg Oral Daily Mauricio Annett Gula, MD      . carvedilol (COREG) tablet 3.125 mg  3.125 mg Oral BID WC Coralie Keens, MD      . enoxaparin (LOVENOX) injection 40 mg  40 mg Subcutaneous Q24H Coralie Keens, MD      . Melene Muller ON 07/05/2016] FLUoxetine (PROZAC) tablet 60 mg  60 mg Oral Daily Mauricio Annett Gula, MD      . Melene Muller ON 07/05/2016] furosemide (LASIX) tablet 20 mg  20 mg Oral Daily Mauricio Annett Gula, MD      . Melene Muller ON 07/05/2016] insulin aspart (novoLOG) injection 0-9 Units  0-9 Units Subcutaneous TID WC Mauricio Annett Gula, MD      . insulin glargine (LANTUS) injection 20 Units  20 Units Subcutaneous BID Coralie Keens, MD      . Melene Muller ON 07/05/2016] levothyroxine (SYNTHROID, LEVOTHROID) tablet 50 mcg  50 mcg Oral QAC breakfast Coralie Keens, MD      . Melene Muller ON 07/05/2016] lisinopril (PRINIVIL,ZESTRIL) tablet 5 mg  5 mg Oral Daily Mauricio Annett Gula, MD      . metoCLOPramide (REGLAN) tablet 10 mg  10 mg Oral TID AC & HS Mauricio Annett Gula, MD      . ondansetron [facility]) tablet 4 mg  4 mg Oral Q6H PRN Coralie Keens, MD       Or   . ondansetron [facility]) injection 4 mg  4 mg Intravenous Q6H PRN Coralie Keens, MD      . Melene Muller ON 07/05/2016] oxybutynin (DITROPAN-XL) 24 hr tablet 10 mg  10 mg Oral Daily Mauricio Annett Gula, MD      . pantoprazole (PROTONIX) EC tablet 40 mg  40 mg Oral Daily Mauricio Annett Gula, MD      . Melene Muller ON 07/05/2016] rosuvastatin (CRESTOR) tablet 40 mg  40 mg Oral Daily Mauricio Annett Gula, MD      . sodium chloride flush (NS) 0.9 % injection 3 mL  3 mL Intravenous Q12H Mauricio Annett Gula, MD      . sodium chloride flush (NS) 0.9 % injection 3 mL  3 mL Intravenous Q12H Mauricio Annett Gula, MD      . sodium chloride flush (NS) 0.9 % injection 3 mL  3 mL Intravenous PRN Mauricio Annett Gula, MD        Allergies: Allergies  Allergen Reactions  . Erythromycin Diarrhea  . Iodine-131 Nausea And Vomiting  . Gadolinium Nausea And Vomiting     Code: VOM, Desc: Pt began vomiting immed post infusion of multihance, Onset Date: [id number]   . Ivp Dye [Iodinated Diagnostic Agents] Nausea And Vomiting       . Metformin Diarrhea  . Penicillins Hives    Has patient had a PCN reaction causing immediate rash, facial/tongue/throat swelling, SOB or lightheadedness with hypotension: No Has patient had a PCN reaction causing severe rash involving mucus membranes or skin necrosis: No Has patient had a PCN reaction that required hospitalization No Has patient had a PCN reaction occurring within the last 10 years: No If all of the above answers are "  NO", then may proceed with Cephalosporin use.     ROS: As per HPI. A full 14-point review of systems was performed and is otherwise unremarkable.   PE:  BP (!) 166/55 (BP Location: Left Arm)   Pulse 60   Temp 98.1 F (36.7 C) (Oral)   Resp 18   Ht 5\' 5"  (1.651 m)   Wt 99.9 kg (220 lb 3.2 oz)   SpO2 96%   BMI 36.64 kg/m   General: WDWN, no acute distress. AAO x4. Speech clear, no dysarthria. No aphasia. Follows commands briskly.  Affect is bright with congruent mood. Comportment is normal.  HEENT: Normocephalic. Neck supple without LAD. MMM, OP clear. Dentition good. Sclerae anicteric. No conjunctival injection.  CV: Regular, no murmur. Carotid pulses full and symmetric, no bruits. Distal pulses 2+ and symmetric.  Lungs: CTAB.  Abdomen: Soft, obese, non-distended, non-tender. Bowel sounds present x4.  Extremities: No C/C/E. Neuro:  CN: Pupils are equal and round. They are symmetrically reactive from 3-->2 mm. VFFTC. EOMI without nystagmus. No reported diplopia. Facial sensation is intact to light touch. Face is symmetric at rest with normal strength and mobility. Hearing is intact to conversational voice. Palate elevates symmetrically and uvula is midline. Voice is normal in tone, pitch and quality. Bilateral SCM and trapezii are 5/5. Tongue is midline with normal bulk and mobility.  Motor: Normal bulk, tone, and strength. She is s/p R BKA. No tremor or other abnormal movements. No drift.  Sensation: Intact to light touch and pinprick. Vibration is reduced in the L foot and ankle. She is s/p R BKA. DTRs: 3+, symmetric. She is s/p R BKA. No pathologic reflexes.  Coordination: Finger-to-nose is without dysmetria. Finger taps are normal in amplitude and speed, no decrement. Heel-to-shin could not be performed as he is s/p R BKA.   Labs:  Lab Results  Component Value Date   WBC 6.2 07/04/2016   HGB 12.9 07/04/2016   HCT 37.3 07/04/2016   PLT 181 07/04/2016   GLUCOSE 390 (H) 07/04/2016   CHOL 254 (H) 07/01/2009   TRIG 478 (H) 07/01/2009   HDL 33 (L) 07/01/2009   LDLCALC See Comment mg/dL 13/06/2009   ALT 17 59/56/3875   AST 23 07/04/2016   NA 137 07/04/2016   K 4.3 07/04/2016   CL 104 07/04/2016   CREATININE 1.30 (H) 07/04/2016   BUN 25 (H) 07/04/2016   CO2 22 07/04/2016   TSH 5.975 (H) 10/21/2015   INR 1.03 10/21/2015   HGBA1C 10.0 (H) 12/05/2015   MICROALBUR 1.59 07/01/2009    Imaging:  I have personally  and independently reviewed CT scan of the head without contrast from today. This shows mild diffuse generalized atrophy. There is a mild burden of chronic small vessel ischemic change noted in the bihemispheric white matter. No acute abnormalities seen.  Assessment and Plan:  1. Possible TIA: She has had several brief recurrent episodes of right-sided weakness and slurred speech. These episodes are concerning for possible TIA. She has several risk factors for cerebrovascular disease including diabetes, hyperlipidemia, hypertension, and sleep apnea. Her diabetes is poorly controlled with current glucose 390 and most recent hemoglobin A1c 10.0. She reports that she has not been taking daily aspirin because this has been left out of the shipments of her medications for the past 2 months. Recommend further evaluation with MRI scan of the brain, MRA head, carotid Dopplers, transthoracic echocardiogram, hemoglobin A1c, and fasting lipid panel. She does have a pacemaker in place. If this  precludes imaging with MRI, then would recommend CT angiogram of the head and neck in place of the MRA head and carotid Dopplers. Initiate aspirin 81 mg daily for secondary prevention. Recommend initiation of statin for significant prevention with goal LDL less than 70. Given recurrent TIA symptoms, it may be advisable to allow some permissive hypertension in the acute setting at least until evaluation of the carotid arteries has been completed to exclude carotid stenosis as a cause for her symptoms. Recommend aggressive control of glucose.  2. Right-sided weakness: This has been intermittent over the past 4 days. No symptoms at present. Evaluation as outlined above.  3. Slurred speech: Again, intermittent over the past 4 days. Workup as above. Her graft   4. 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 She  denies any associated pain or discomfort so there is no role for medical intervention. Ensure tight control of glucose in the long run to minimize further injury to her nerves.  This was discussed with the patient and she is in agreement with the plan as noted. She was given the opportunity to ask any questions and these were addressed to her satisfaction.  Thank you for this consultation. The stroke team will assume care of the patient beginning 07/05/16.

## 2016-07-04 NOTE — ED Notes (Signed)
EKG given to Dr. Yelverton 

## 2016-07-04 NOTE — ED Triage Notes (Signed)
EMS reports CBG 454, BP 140/100, HR 80's A paced, 94% on room air

## 2016-07-04 NOTE — ED Notes (Signed)
Spoke with RN on floor and gave report

## 2016-07-04 NOTE — ED Notes (Signed)
Pt's CBG 394.  Informed Hope, RN.

## 2016-07-04 NOTE — Progress Notes (Signed)
[facility] notified that pt has arrived to unit.

## 2016-07-04 NOTE — H&P (Signed)
History and Physical    SHELAH HEATLEY [name]:[id number] DOB: 01-24-1961 DOA: 07/04/2016  PCP: Sid Falcon, MD   Patient coming from: Home  Chief Complaint: Right arm, right leg weakness  HPI: [name] is a 55 y.o. female with medical history significant of diabetes and hypertension who presentes to the hospital with recurrent focal neurologic deficit. For last 4 days patient has been experiencing a focal neurologic deficit. It has occurred at least once daily, has been consisting in right arm and right leg weakness, mild to moderate in intensity, no associated symptoms, no improving or worsening factors, lasting between 3 minutes up to 10 minutes. The symptoms have been not been worsening but they have been persistent reason why she was prompted to come to the hospital for further evaluation.   She continued to smoke cigarettes about 1 pack per day. She does not take aspirin on a regular basis.   ED Course: Patient was evaluated for focal neurologic deficit with head CT. Negative for acute findings. Admitted for further workup, sitting diabetes and high blood pressure, high risk cluster ischemic attack/cerebrovascular accident.   Review of Systems:  1. General. No fever chills 2. ENT no runny nose or sore throat 3. Pulmonary no shortness of breath cough or hemoptysis 4. Cardiovascular no angina no claudication, no PND orthopnea 5. Gastrointestinal no nausea vomiting or diarrhea 6. Musculoskeletal no joint pain 7. Hematology no easy bruisability or frequent infections 8. Endocrine no tremors, heat or cold intolerance 9. Urology no dysuria or increased urinary frequency 10. Skin positive for right foot diabetic ulcer, nonpainful.   Past Medical History:  Diagnosis Date  . Anxiety   . Cellulitis   . CHF (congestive heart failure) (HCC)   . Chronic bronchitis (HCC)   . Complication of anesthesia 01/2013   "didn't know where I was; who I was; talking out the top of my head"  (15-May-2013)  . Concussion as a teenager   slight  . COPD (chronic obstructive pulmonary disease) (HCC)    wear oxygen at home  . Daily headache   . Depression    takes CYmbalta daily  . Diabetic neuropathy (HCC)   . Diarrhea    pt takes Reglan tid  . GERD (gastroesophageal reflux disease)   . Hyperlipidemia   . Hypertension   . Hypothyroidism    takes Synthroid daily  . Kidney stone    "passed on before" (05/15/13)  . MRSA (methicillin resistant staph aureus) culture positive    hx of 2012  . Neuromuscular disorder (HCC)   . Non-healing wound of lower extremity    lt foot  . Orthopnea    "just one; yesterday morning" (May 15, 2013)  . OSA on CPAP    and oxygen  . Peripheral vascular disease (HCC)   . Pneumonia 1980's   "hospitalized w/it" (2013-05-15)  . Rheumatoid arthritis (HCC)    "both knees" (15-May-2013)  . Staph infection 2007  . Symptomatic bradycardia 03/04/2014  . Thyroid disease   . Type II diabetes mellitus (HCC)    Novolog,Levemir,and Lovaza daily    Past Surgical History:  Procedure Laterality Date  . AMPUTATION    . BLADDER SURGERY  1970's   "stretched the mouth of my bladder" (May 15, 2013)  . DILATION AND CURETTAGE OF UTERUS  1980's  . FOOT SURGERY Right 2012   "put pins in one year; amputated toes another OR" (05-15-13)  . GANGLION CYST EXCISION Left 1990's   "wrist" (05-15-13)  . I&D EXTREMITY Left  08/25/2013   Procedure: IRRIGATION AND DEBRIDEMENT EXTREMITY;  Surgeon: Axel FillerArmando Ramirez, MD;  [facility];  Service: General;  Laterality: Left;  . INCISION AND DRAINAGE ABSCESS N/A 01/24/2013   Procedure: INCISION AND DRAINAGE ABSCESS;  Surgeon: Clovis Puhomas A. Cornett, MD;  Location: [facility] OR;  Service: General;  Laterality: N/A;  . INGUINAL HERNIA REPAIR N/A 01/28/2013   Procedure: I&D Right Groin Wound;  Surgeon: Shelly Rubensteinouglas A Blackman, MD;  Location: [facility] OR;  Service: General;  Laterality: N/A;  . LEG AMPUTATION BELOW KNEE Right 2012  . PACEMAKER INSERTION  03/05/14    Biotronik dual chamber pacemaker implanted by Dr Graciela HusbandsKlein for symptomatic bradycardia  . PERMANENT PACEMAKER INSERTION N/A 03/05/2014   Procedure: PERMANENT PACEMAKER INSERTION;  Surgeon: Duke SalviaSteven C Klein, MD;  [facility];  Service: Cardiovascular;  Laterality: N/A;  . REFRACTIVE SURGERY Bilateral 2014  . STUMP REVISION Right 12/11/2014   Procedure: Right Below Knee Amputation Revision;  Surgeon: Nadara MustardMarcus Duda V, MD;  [facility];  Service: Orthopedics;  Laterality: Right;  . TOE AMPUTATION Left ?2011    only 2 toes remaning.   . TUBAL LIGATION  1990's  . WRIST SURGERY Right 1980's   "pinched nerve" (04/21/2013)     reports that she has been smoking Cigarettes.  She has a 33.00 pack-year smoking history. She has never used smokeless tobacco. She reports that she does not drink alcohol or use drugs.  Allergies  Allergen Reactions  . Erythromycin Diarrhea  . Iodine-131 Nausea And Vomiting  . Gadolinium Nausea And Vomiting     Code: VOM, Desc: Pt began vomiting immed post infusion of multihance, Onset Date: [id number]   . Ivp Dye [Iodinated Diagnostic Agents] Nausea And Vomiting       . Metformin Diarrhea  . Penicillins Hives    Has patient had a PCN reaction causing immediate rash, facial/tongue/throat swelling, SOB or lightheadedness with hypotension: No Has patient had a PCN reaction causing severe rash involving mucus membranes or skin necrosis: No Has patient had a PCN reaction that required hospitalization No Has patient had a PCN reaction occurring within the last 10 years: No If all of the above answers are "NO", then may proceed with Cephalosporin use.     Family History  Problem Relation Age of Onset  . Hypertension Mother   . Hyperlipidemia Mother   . Hypertrophic cardiomyopathy Mother     has pacer  . Alzheimer's disease Father   . Hypertension Brother   . Hyperlipidemia Brother   . Cancer Maternal Aunt   . Diabetes Paternal Aunt   . Diabetes Paternal Uncle     . Diabetes Paternal Grandmother   . Anesthesia problems Neg Hx   . Hypotension Neg Hx   . Malignant hyperthermia Neg Hx   . Pseudochol deficiency Neg Hx    Unacceptable: Noncontributory, unremarkable, or negative. Acceptable: Family history reviewed and not pertinent (If you reviewed it)  Prior to Admission medications   Medication Sig Start Date End Date Taking? Authorizing Provider  carvedilol (COREG) 3.125 MG tablet Take 1 tablet (3.125 mg total) by mouth 2 (two) times daily with a meal. 10/25/15  Yes Rolly SalterPranav M Patel, MD  FLUoxetine (PROZAC) 20 MG tablet Take 60 mg by mouth daily. 11/24/15  Yes Historical Provider, MD  furosemide (LASIX) 40 MG tablet Take 0.5 tablets (20 mg total) by mouth daily. 10/25/15  Yes Rolly SalterPranav M Patel, MD  insulin aspart (NOVOLOG FLEXPEN) 100 UNIT/ML FlexPen Inject 0-50 Units into the skin 3 (  three) times daily with meals. Per sliding scale   Yes Historical Provider, MD  insulin detemir (LEVEMIR) 100 UNIT/ML injection Inject 0.55 mLs (55 Units total) into the skin 2 (two) times daily. Patient taking differently: Inject 45 Units into the skin 2 (two) times daily.  12/07/15  Yes Leroy Sea, MD  levothyroxine (SYNTHROID, LEVOTHROID) 25 MCG tablet Take 2 tablets (50 mcg total) by mouth every morning. 10/25/15  Yes Rolly Salter, MD  lisinopril (PRINIVIL,ZESTRIL) 5 MG tablet Take 5 mg by mouth daily.   Yes Historical Provider, MD  metoCLOPramide (REGLAN) 10 MG tablet Take 10 mg by mouth 4 (four) times daily. Take 1 tablet (10 mg) by mouth 30 minutes before meals and at bedtime 11/25/15  Yes Historical Provider, MD  omeprazole (PRILOSEC) 40 MG capsule Take 40 mg by mouth at bedtime. 11/22/15  Yes Historical Provider, MD  oxybutynin (DITROPAN-XL) 5 MG 24 hr tablet Take 10 mg by mouth daily.  11/22/15  Yes Historical Provider, MD  rosuvastatin (CRESTOR) 20 MG tablet Take 40 mg by mouth daily.  11/22/15  Yes Historical Provider, MD  acyclovir ointment (ZOVIRAX) 5 % Apply topically  every 3 (three) hours. Patient not taking: Reported on 07/04/2016 12/07/15   Leroy Sea, MD  amLODipine (NORVASC) 10 MG tablet Take 1 tablet (10 mg total) by mouth daily. Patient not taking: Reported on 07/04/2016 12/08/15   Leroy Sea, MD  docusate sodium (COLACE) 100 MG capsule Take 2 capsules (200 mg total) by mouth daily. Patient not taking: Reported on 07/04/2016 12/08/15   Leroy Sea, MD  doxycycline (VIBRA-TABS) 100 MG tablet Take 1 tablet (100 mg total) by mouth every 12 (twelve) hours. Patient not taking: Reported on 07/04/2016 12/08/15   Leroy Sea, MD  hydrALAZINE (APRESOLINE) 25 MG tablet Take 1 tablet (25 mg total) by mouth every 8 (eight) hours. Patient not taking: Reported on 07/04/2016 10/25/15   Rolly Salter, MD  HYDROcodone-acetaminophen (NORCO/VICODIN) 5-325 MG tablet Take 1 tablet by mouth every 4 (four) hours as needed for moderate pain. Patient not taking: Reported on 07/04/2016 12/07/15   Leroy Sea, MD  valACYclovir (VALTREX) 1000 MG tablet Take 1 tablet (1,000 mg total) by mouth 3 (three) times daily. Patient not taking: Reported on 07/04/2016 12/07/15   Leroy Sea, MD    Physical Exam: Vitals:   07/04/16 1403 07/04/16 1430 07/04/16 1500 07/04/16 1615  BP: (!) 119/42 (!) 139/53 126/61 131/65  Pulse: 67 66 66 68  Resp: 18 14 16 14   Temp:      TempSrc:      SpO2: 95% 94% 95% 96%  Weight:      Height:          Constitutional: NAD, calm, comfortable Vitals:   07/04/16 1403 07/04/16 1430 07/04/16 1500 07/04/16 1615  BP: (!) 119/42 (!) 139/53 126/61 131/65  Pulse: 67 66 66 68  Resp: 18 14 16 14   Temp:      TempSrc:      SpO2: 95% 94% 95% 96%  Weight:      Height:       Eyes: PERRL, lids and conjunctivae normal Head normocephalic,and ears no deformities ENMT: Mucous membranes are moist. Posterior pharynx clear of any exudate or lesions.Normal dentition.  Neck: normal, supple, no masses, no thyromegaly Respiratory: clear to  auscultation bilaterally, no wheezing, no crackles. Normal respiratory effort. No accessory muscle use.  Cardiovascular: Regular rate and rhythm, no murmurs / rubs / gallops.  No extremity edema. 2+ pedal pulses. No carotid bruits.  Abdomen: no tenderness, no masses palpated. No hepatosplenomegaly. Bowel sounds positive.  Musculoskeletal: no clubbing / cyanosis. No joint deformity upper and lower extremities. Good ROM, no contractures. Normal muscle tone. Below the knee amputation on the right.  Skin: Left foot, first 3 to have been amputated. Positive ulcer at the base of the first metatarsal, about 2 cm in diameter, unstageable, clean base with no purulence, erythema or edema.  Neurologic: CN 2-12 grossly intact. Sensation intact, DTR normal. Strength 5/5 in all 4.     Labs on Admission: I have personally reviewed following labs and imaging studies  CBC:  Recent Labs Lab 07/04/16 1308  WBC 6.2  NEUTROABS 3.6  HGB 12.9  HCT 37.3  MCV 82.5  PLT 181   Basic Metabolic Panel:  Recent Labs Lab 07/04/16 1308  NA 137  K 4.3  CL 104  CO2 22  GLUCOSE 390*  BUN 25*  CREATININE 1.30*  CALCIUM 9.0   GFR: Estimated Creatinine Clearance (by C-G formula based on SCr of 1.3 mg/dL (H)) Female: 09.3 mL/min Female: 70.3 mL/min Liver Function Tests:  Recent Labs Lab 07/04/16 1308  AST 23  ALT 17  ALKPHOS 98  BILITOT 0.3  PROT RESULTS UNAVAILABLE DUE TO INTERFERING SUBSTANCE  ALBUMIN 3.0*   No results for input(s): LIPASE, AMYLASE in the last 168 hours. No results for input(s): AMMONIA in the last 168 hours. Coagulation Profile: No results for input(s): INR, PROTIME in the last 168 hours. Cardiac Enzymes: No results for input(s): CKTOTAL, CKMB, CKMBINDEX, TROPONINI in the last 168 hours. BNP (last 3 results) No results for input(s): PROBNP in the last 8760 hours. HbA1C: No results for input(s): HGBA1C in the last 72 hours. CBG:  Recent Labs Lab 07/04/16 1244 07/04/16 1517   GLUCAP 394* 288*   Lipid Profile: No results for input(s): CHOL, HDL, LDLCALC, TRIG, CHOLHDL, LDLDIRECT in the last 72 hours. Thyroid Function Tests: No results for input(s): TSH, T4TOTAL, FREET4, T3FREE, THYROIDAB in the last 72 hours. Anemia Panel: No results for input(s): VITAMINB12, FOLATE, FERRITIN, TIBC, IRON, RETICCTPCT in the last 72 hours. Urine analysis:    Component Value Date/Time   COLORURINE YELLOW 12/05/2015 0207   APPEARANCEUR CLOUDY (A) 12/05/2015 0207   LABSPEC 1.016 12/05/2015 0207   PHURINE 5.0 12/05/2015 0207   GLUCOSEU 100 (A) 12/05/2015 0207   HGBUR MODERATE (A) 12/05/2015 0207   HGBUR negative 03/25/2010 1147   BILIRUBINUR NEGATIVE 12/05/2015 0207   KETONESUR NEGATIVE 12/05/2015 0207   PROTEINUR >300 (A) 12/05/2015 0207   UROBILINOGEN 0.2 12/07/2014 2125   NITRITE NEGATIVE 12/05/2015 0207   LEUKOCYTESUR NEGATIVE 12/05/2015 0207   Sepsis Labs: !!!!!!!!!!!!!!!!!!!!!!!!!!!!!!!!!!!!!!!!!!!! @LABRCNTIP (procalcitonin:4,lacticidven:4) )No results found for this or any previous visit (from the past 240 hour(s)).   Radiological Exams on Admission: Ct Head Wo Contrast  Result Date: 07/04/2016 CLINICAL DATA:  Four day history of right-sided arm and leg weakness. Slurred speech. EXAM: CT HEAD WITHOUT CONTRAST TECHNIQUE: Contiguous axial images were obtained from the base of the skull through the vertex without intravenous contrast. COMPARISON:  October 21, 2015 FINDINGS: Brain: Mild atrophy for age is stable. There is no intracranial mass, hemorrhage, extra-axial fluid collection, or midline shift. There is mild patchy small vessel disease in the centra semiovale bilaterally. Elsewhere gray-white compartments appear unremarkable and stable. No acute infarct is evident on this study. Vascular: There is no hyperdense vessel. There is mild calcification in the distal carotid siphon region on  the left. Skull: Bony calvarium appears intact. Sinuses/Orbits: Visualized paranasal  sinuses are clear. Orbits appear symmetric bilaterally Other: Mastoid air cells are clear. IMPRESSION: Mild atrophy with mild patchy periventricular small vessel disease, stable. No acute infarct evident. No mass, hemorrhage, or extra-axial fluid collection. There is mild calcification in the distal left carotid siphon region. Electronically Signed   By: Bretta Bang III M.D.   On: 07/04/2016 15:30    EKG: Independently reviewed. Normal sinus rhythm with left axis deviation.  Assessment/Plan Active Problems:   TIA (transient ischemic attack)   This is a 55 year old female who has diabetes and hypertension who presents to the hospital with a recurrent focal neurologic deficit mainly localized on the right arm and right leg. The deficit has been intermittent almost 1 time per day, on her physical examination she is nonfocal. Blood pressure 126/61, heart rate 66, respiratory 16, oxygen saturation 95% Sodium 137, potassium 4.3, BUN 25, creatinine 1.30, glucose 390, AST 23, ALT 17, white cell count 6.2, hemoglobin 12.9, hematocrit 37.3, platelets 181. EKG normal sinus rhythm with left axis deviation. Head CT with mild atrophy with mild patchy periventricular small vessel disease, stable no acute infarct, mild calcification of the distal left carotid siphon region.   Working diagnosis transitory ischemic attack.  1. Transitory ischemic attack. Will admit patient to the telemetry unit, neuro checks every 4 hours, will place patient on aspirin 81 mg and continue statin therapy, will check fasting lipid profile, PT/ OT evaluation, ultrasonography of the carotids and echocardiography. Will consult neurology for further recommendations.  2. Type 2 diabetes mellitus. Will place patient on insulin sliding scale for glucose monitoring and coverage. Will use half of her usual long-acting insulin dose to avoid hypoglycemia. It is presumed the patient is noncompliant, check hemoglobin A1c.  3. Hypertension.  Continue lisinopril.  4. Left foot diabetic ulcer. Will continue local wound care, at this point no signs of systemic infection.  5. Hypothyroidism. Continue levothyroxine  6. Chronic kidney disease stage III. 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 Will avoid nephrotoxic agents or hypotension. Follow-up renal panel in the morning.     DVT prophylaxis:  Enoxaparin Code Status:  Full   Family Communication: No family at the bedside   Disposition Plan:  Home  Consults called: Neurology  Admission status: Observation   Sartaj Hoskin Annett Gula MD [facility] Pager [phone]  If 7PM-7AM, please contact night-coverage [email or web address] Password [facility]  07/04/2016, 4:37 PM

## 2016-07-04 NOTE — ED Triage Notes (Addendum)
Pt has had 4 TIAs in the past four days. LSN 1145. Right sided weakness, right leg weakness, slurred speech. Pt has baseline blurred vision. Symptoms resolved once fire arrived. Pt presents with no continuing symptoms. Prior episode was 1am lasting approx 5 mins.

## 2016-07-04 NOTE — Progress Notes (Signed)
Pt on telemetry monitoring.  

## 2016-07-04 NOTE — Progress Notes (Addendum)
On call paged [phone] as pt took Levemir 45 units BID PTA, Lantus 20 units ordered BID on admit and pt requesting to have it switches. No new orders at this time. Second page [phone]. Order to continue lantus dosing and pt discuss with MD in AM, plan is to check A1C in AM, see Arrien, MD note 07/04/16

## 2016-07-04 NOTE — Progress Notes (Signed)
Pt received at this time from ED with no noted distress. Neuro intact. Spoke with nurse Hope briefly. Pt was able to give report. Pt denies pain or discomfort. She reports that  has been having intermittent right upper arm weakness and has resolved at this time.  No family at bedside. Safety measures in place. Pt oriented to room. Call bell within reach. Will continue to monitor.

## 2016-07-04 NOTE — ED Notes (Signed)
Pt's NIH 0, passed swallow screen. No neuro deficits. Next neuro check at 1600. Pt alert/oriented x4

## 2016-07-05 ENCOUNTER — Observation Stay (HOSPITAL_COMMUNITY): Payer: Medicare HMO

## 2016-07-05 ENCOUNTER — Encounter (HOSPITAL_COMMUNITY): Payer: Medicare HMO

## 2016-07-05 ENCOUNTER — Other Ambulatory Visit (HOSPITAL_COMMUNITY): Payer: Medicare HMO

## 2016-07-05 DIAGNOSIS — D638 Anemia in other chronic diseases classified elsewhere: Secondary | ICD-10-CM | POA: Diagnosis not present

## 2016-07-05 DIAGNOSIS — G458 Other transient cerebral ischemic attacks and related syndromes: Secondary | ICD-10-CM | POA: Diagnosis not present

## 2016-07-05 DIAGNOSIS — G459 Transient cerebral ischemic attack, unspecified: Secondary | ICD-10-CM | POA: Diagnosis not present

## 2016-07-05 DIAGNOSIS — I5032 Chronic diastolic (congestive) heart failure: Secondary | ICD-10-CM | POA: Diagnosis not present

## 2016-07-05 LAB — CBC
HEMATOCRIT: 33.1 % — AB (ref 36.0–46.0)
HEMOGLOBIN: 11.2 g/dL — AB (ref 12.0–15.0)
MCH: 28.1 pg (ref 26.0–34.0)
MCHC: 33.8 g/dL (ref 30.0–36.0)
MCV: 83 fL (ref 78.0–100.0)
Platelets: 143 10*3/uL — ABNORMAL LOW (ref 150–400)
RBC: 3.99 MIL/uL (ref 3.87–5.11)
RDW: 13.6 % (ref 11.5–15.5)
WBC: 5.9 10*3/uL (ref 4.0–10.5)

## 2016-07-05 LAB — COMPREHENSIVE METABOLIC PANEL
ALBUMIN: 2.6 g/dL — AB (ref 3.5–5.0)
ALT: 16 U/L (ref 14–54)
ANION GAP: 9 (ref 5–15)
AST: 18 U/L (ref 15–41)
Alkaline Phosphatase: 83 U/L (ref 38–126)
BILIRUBIN TOTAL: 0.4 mg/dL (ref 0.3–1.2)
BUN: 23 mg/dL — AB (ref 6–20)
CHLORIDE: 102 mmol/L (ref 101–111)
CO2: 25 mmol/L (ref 22–32)
Calcium: 8.6 mg/dL — ABNORMAL LOW (ref 8.9–10.3)
Creatinine, Ser: 1.18 mg/dL — ABNORMAL HIGH (ref 0.44–1.00)
GFR calc Af Amer: 59 mL/min — ABNORMAL LOW (ref >60)
GFR calc non Af Amer: 51 mL/min — ABNORMAL LOW (ref >60)
GLUCOSE: 323 mg/dL — AB (ref 65–99)
POTASSIUM: 3.7 mmol/L (ref 3.5–5.1)
SODIUM: 136 mmol/L (ref 135–145)
TOTAL PROTEIN: 5.5 g/dL — AB (ref 6.5–8.1)

## 2016-07-05 LAB — LIPID PANEL
Cholesterol: 217 mg/dL — ABNORMAL HIGH (ref 0–200)
HDL: 23 mg/dL — AB (ref >40)
LDL CALC: UNDETERMINED mg/dL (ref 0–99)
TRIGLYCERIDES: 1090 mg/dL — AB (ref <150)
Total CHOL/HDL Ratio: 9.4 RATIO
VLDL: UNDETERMINED mg/dL (ref 0–40)

## 2016-07-05 LAB — GLUCOSE, CAPILLARY
GLUCOSE-CAPILLARY: 345 mg/dL — AB (ref 65–99)
GLUCOSE-CAPILLARY: 370 mg/dL — AB (ref 65–99)
Glucose-Capillary: 335 mg/dL — ABNORMAL HIGH (ref 65–99)
Glucose-Capillary: 310 mg/dL — ABNORMAL HIGH (ref 65–99)

## 2016-07-05 MED ORDER — ASPIRIN EC 325 MG PO TBEC
325.0000 mg | DELAYED_RELEASE_TABLET | Freq: Every day | ORAL | Status: DC
  Administered 2016-07-06: 325 mg via ORAL
  Filled 2016-07-05: qty 1

## 2016-07-05 MED ORDER — METOCLOPRAMIDE HCL 5 MG PO TABS
5.0000 mg | ORAL_TABLET | Freq: Three times a day (TID) | ORAL | Status: DC
  Administered 2016-07-05 – 2016-07-06 (×5): 5 mg via ORAL
  Filled 2016-07-05 (×5): qty 1

## 2016-07-05 NOTE — Progress Notes (Signed)
STROKE TEAM PROGRESS NOTE   HISTORY OF PRESENT ILLNESS (per record) This is a 55 year old right-handed woman who presents for evaluation of right-sided weakness. History is obtained directly from the patient was Historian.  The patient reports that over the past 4 days, she has had intermittent episodes of weakness involving the right side of her body. This involves both the arm and leg but she has not appreciated any symptoms in her face. However, the weakness is accompanied by some slurred speech. She reports one episode over each of the past 4 days. These range in duration from five to ten minutes and completely resolve in between. There is no clear provocation for the symptoms and they seem to occur without any clear association to time of day or activity. She has never had similar symptoms in the past. She denies any prior history of stroke. She is presently asymptomatic.  Patient was not administered IV t-PA secondary to no stroke symptoms. She was admitted for further evaluation and treatment.   SUBJECTIVE (INTERVAL HISTORY) No family is at the bedside.  Overall she feels her condition is stable. She recounted HPI with Dr. Pearlean Brownie.    OBJECTIVE Temp:  [97.6 F (36.4 C)-98.5 F (36.9 C)] 98.1 F (36.7 C) (11/15 0740) Pulse Rate:  [60-108] 66 (11/15 0740) Cardiac Rhythm: Normal sinus rhythm (11/15 0700) Resp:  [9-20] 18 (11/15 0740) BP: (109-166)/(42-83) 161/54 (11/15 0740) SpO2:  [93 %-100 %] 94 % (11/15 0740) Weight:  [98.9 kg (218 lb)-99.9 kg (220 lb 3.2 oz)] 99.9 kg (220 lb 3.2 oz) (11/14 1705)  CBC:   Recent Labs Lab 07/04/16 1308 07/05/16 0214  WBC 6.2 5.9  NEUTROABS 3.6  --   HGB 12.9 11.2*  HCT 37.3 33.1*  MCV 82.5 83.0  PLT 181 143*    Basic Metabolic Panel:   Recent Labs Lab 07/04/16 1308 07/05/16 0214  NA 137 136  K 4.3 3.7  CL 104 102  CO2 22 25  GLUCOSE 390* 323*  BUN 25* 23*  CREATININE 1.30* 1.18*  CALCIUM 9.0 8.6*    Lipid Panel:      Component Value Date/Time   CHOL 217 (H) 07/05/2016 0214   TRIG 1,090 (H) 07/05/2016 0214   HDL 23 (L) 07/05/2016 0214   CHOLHDL 9.4 07/05/2016 0214   VLDL UNABLE TO CALCULATE IF TRIGLYCERIDE OVER 400 mg/dL 72/53/6644 0347   LDLCALC UNABLE TO CALCULATE IF TRIGLYCERIDE OVER 400 mg/dL 42/59/5638 7564   PPIR5J:  Lab Results  Component Value Date   HGBA1C 10.0 (H) 12/05/2015   Urine Drug Screen:     Component Value Date/Time   LABOPIA NONE DETECTED 10/13/2015 0133   COCAINSCRNUR NONE DETECTED 10/13/2015 0133   LABBENZ NONE DETECTED 10/13/2015 0133   AMPHETMU NONE DETECTED 10/13/2015 0133   THCU NONE DETECTED 10/13/2015 0133   LABBARB NONE DETECTED 10/13/2015 0133      IMAGING  Ct Head Wo Contrast  Result Date: 07/04/2016 CLINICAL DATA:  Four day history of right-sided arm and leg weakness. Slurred speech. EXAM: CT HEAD WITHOUT CONTRAST TECHNIQUE: Contiguous axial images were obtained from the base of the skull through the vertex without intravenous contrast. COMPARISON:  October 21, 2015 FINDINGS: Brain: Mild atrophy for age is stable. There is no intracranial mass, hemorrhage, extra-axial fluid collection, or midline shift. There is mild patchy small vessel disease in the centra semiovale bilaterally. Elsewhere gray-white compartments appear unremarkable and stable. No acute infarct is evident on this study. Vascular: There is no hyperdense vessel. There  is mild calcification in the distal carotid siphon region on the left. Skull: Bony calvarium appears intact. Sinuses/Orbits: Visualized paranasal sinuses are clear. Orbits appear symmetric bilaterally Other: Mastoid air cells are clear. IMPRESSION: Mild atrophy with mild patchy periventricular small vessel disease, stable. No acute infarct evident. No mass, hemorrhage, or extra-axial fluid collection. There is mild calcification in the distal left carotid siphon region. Electronically Signed   By: Bretta Bang III M.D.   On: 07/04/2016  15:30     PHYSICAL EXAM Pleasant middle aged Caucasian lady not in distress. . Afebrile. Head is nontraumatic. Neck is supple without bruit.    Cardiac exam no murmur or gallop. Lungs are clear to auscultation. Distal pulses are not  Felt. 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 Neurological Exam ;  Awake  Alert oriented x 3. Normal speech and language.eye movements full without nystagmus.fundi were not visualized. Vision acuity and fields appear normal. Hearing is normal. Palatal movements are normal. Face symmetric. Tongue midline. Normal strength, tone, reflexes and coordination. Normal sensation. Gait deferred.  ASSESSMENT/PLAN Ms. AURELLA JANUSZEWSKI is a 55 y.o. female with history of diabetes and hypertension presenting with intermittent right-sided weakness and slurred speech. She did not receive IV t-PA.   Possible Stroke vs TIA due to small vessel disease   MRI  / MRA  Pacer  Unable to do CTA head and neck due to dye allergy  Repeat CT head today to confirm/refute stroke  Carotid Doppler  pending   2D Echo  pending   Check TCD to look at vasculature  LDL UTC  HgbA1c pending  Lovenox 40 mg sq daily for VTE prophylaxis Diet heart healthy/carb modified Room service appropriate? Yes; Fluid consistency: Thin  No antithrombotic prior to admission, now on aspirin 81 mg daily . Recommend increase aspirin dose to 325 mg daily   Patient counseled to be compliant with her antithrombotic medications  Ongoing aggressive stroke risk factor management  Therapy recommendations:  pending   Disposition:  pending   Hypertension  Stable Permissive hypertension (OK if < 220/120) but gradually normalize in 5-7 days Long-term BP goal normotensive  Hyperlipidemia  Home meds:  crestor 20,   Increased crestor to 40 when resumed in hospital  LDL UTC, goal < 70  Continue statin at discharge  Diabetes type  II  HgbA1c pending, goal < 7.0  Diabetes Coordinator following. Recommends change of Lantus to Levemir 40u bid with addition of Novolog 8u w/ meals (hold is pt eats < 50%)  Other Stroke Risk Factors  Cigarette smoker, advised to stop smoking  Obesity, Body mass index is 36.64 kg/m., recommend weight loss, diet and exercise as appropriate   Obstructive sleep apnea, on CPAP at home  CHF  PVD s/p R BKA  pacer  Other Active Problems  L foot diabetic ulcer  Hypothyroidism  CKD stage III  Hospital day # 0  Rhoderick Moody [facility] See Amion for Pager information 07/05/2016 10:12 AM  I have personally examined this patient, reviewed notes, independently viewed imaging studies, participated in medical decision making and plan of care.ROS completed by me personally and pertinent positives fully documented  I have made any additions or clarifications directly to the above note. Agree with note above. She has presented with recurrent episodes of transient right-sided weakness likely due to left hemispheric TIA etiology probably small vessel disease. Recommend ongoing stroke  workup and start aspirin for stroke prevention. Patient counseled to quit smoking and aggressive risk factor modification. Greater than 50% of time during this 35 minute visit was spent on counseling and coordination of care about stroke risk, prevention and treatment.  Delia Heady, MD Medical Director [facility] Pager: [phone] 07/05/2016 1:43 PM  To contact Stroke Continuity provider, please refer to [email or web address]. After hours, contact General Neurology

## 2016-07-05 NOTE — Progress Notes (Signed)
IV to left hand removed on accident by pt. Pt reported to this nurse that she repositioned her hand and IV was removed. No bleeding to site. Catheter in place. Cleaned and applied dry dressing. Pt does not have order for IV fluids at this time.

## 2016-07-05 NOTE — Evaluation (Signed)
Occupational Therapy Evaluation Patient Details Name: [name] MRN: [id number] DOB: 11-27-1960 Today's Date: 07/05/2016    History of Present Illness 55 yo female admitted with weakness on R side of body. CT negative  Past Medical History:  Diagnosis Date  . Anxiety   . Cellulitis   . CHF (congestive heart failure) (HCC)   . Chronic bronchitis (HCC)   . Complication of anesthesia 01/2013   "didn't know where I was; who I was; talking out the top of my head" (05/08/13)  . Concussion as a teenager   slight  . COPD (chronic obstructive pulmonary disease) (HCC)    wear oxygen at home  . Daily headache   . Depression    takes CYmbalta daily  . Diabetic neuropathy (HCC)   . Diarrhea    pt takes Reglan tid  . GERD (gastroesophageal reflux disease)   . Hyperlipidemia   . Hypertension   . Hypothyroidism    takes Synthroid daily  . Kidney stone    "passed on before" (08-May-2013)  . MRSA (methicillin resistant staph aureus) culture positive    hx of 2012  . Neuromuscular disorder (HCC)   . Non-healing wound of lower extremity    lt foot  . Orthopnea    "just one; yesterday morning" (2013/05/08)  . OSA on CPAP    and oxygen  . Peripheral vascular disease (HCC)   . Pneumonia 1980's   "hospitalized w/it" (05-08-2013)  . Rheumatoid arthritis (HCC)    "both knees" (05-08-13)  . Staph infection 2007  . Symptomatic bradycardia 03/04/2014  . Thyroid disease   . Type II diabetes mellitus (HCC)    Novolog,Levemir,and Lovaza daily      Clinical Impression   Patient evaluated by Occupational Therapy with no further acute OT needs identified. All education has been completed and the patient has no further questions. See below for any follow-up Occupational Therapy or equipment needs. OT to sign off. Thank you for referral.      Follow Up Recommendations  No OT follow up    Equipment Recommendations  None recommended by OT    Recommendations for Other Services        Precautions / Restrictions Precautions Precautions: Fall Precaution Comments: R prosthetic in room  Restrictions Weight Bearing Restrictions: No      Mobility Bed Mobility Overal bed mobility: Modified Independent                Transfers Overall transfer level: Modified independent                    Balance                                            ADL Overall ADL's : At baseline                                       General ADL Comments: pt able to don doff R prosthetic and complete basic transfer. pt is at [employer] Assessment?: No apparent visual deficits   Perception     Praxis      Pertinent Vitals/Pain       Hand Dominance Left   Extremity/Trunk Assessment Upper Extremity Assessment Upper Extremity Assessment: Overall [facility]  for tasks assessed   Lower Extremity Assessment Lower Extremity Assessment: RLE deficits/detail RLE Deficits / Details: R BKA   Cervical / Trunk Assessment Cervical / Trunk Assessment: Kyphotic   Communication Communication Communication: No difficulties   Cognition Arousal/Alertness: Awake/alert Behavior During Therapy: WFL for tasks assessed/performed Overall Cognitive Status: Within Functional Limits for tasks assessed                     General Comments       Exercises       Shoulder Instructions      Home Living Family/patient expects to be discharged to:: Private residence Living Arrangements: Alone Available Help at Discharge: Personal care attendant;Available PRN/intermittently Type of Home: Apartment Home Access: Stairs to enter Entrance Stairs-Number of Steps: 2 at sidewalk with  bilateral rails and one to get in Entrance Stairs-Rails: Right;Left Home Layout: One level     Bathroom Shower/Tub: Producer, television/film/video: Standard Bathroom Accessibility: No   Home Equipment: Grab bars - tub/shower;Bedside commode;Shower  seat;Wheelchair - manual;Hand held Careers information officer - 2 wheels;Cane - single point   Additional Comments: toilet aide 7 days week 1-2 hours/ aide does shopping      Prior Functioning/Environment Level of Independence: Independent with assistive device(s);Needs assistance  Gait / Transfers Assistance Needed: uses grab bars to ambulate into bathroom from w/c ADL's / Homemaking Assistance Needed: aide now (A)  with dressing, bathing. has assist for household chores   Comments: uses w/c mainly, walks short distances, negotiates steps at entrance        OT Problem List:     OT Treatment/Interventions:      OT Goals(Current goals can be found in the care plan section) Acute Rehab OT Goals Potential to Achieve Goals: Good  OT Frequency:     Barriers to D/C:            Co-evaluation              End of Session Equipment Utilized During Treatment: Gait belt;Rolling walker Nurse Communication: Mobility status;Precautions  Activity Tolerance: Patient tolerated treatment well Patient left: in chair;with call bell/phone within reach;with chair alarm set   Time: 1022-1032 OT Time Calculation (min): 10 min Charges:  OT General Charges $OT Visit: 1 Procedure OT Evaluation $OT Eval Moderate Complexity: 1 Procedure G-Codes:    Boone Master B 07-15-2016, 10:43 AM   Mateo Flow   OTR/L Pager: [phone] Office: [phone] .

## 2016-07-05 NOTE — Progress Notes (Signed)
Inpatient Diabetes Program Recommendations  AACE/ADA: New Consensus Statement on Inpatient Glycemic Control (2015)  Target Ranges:  Prepandial:   less than 140 mg/dL      Peak postprandial:   less than 180 mg/dL (1-2 hours)      Critically ill patients:  140 - 180 mg/dL   Lab Results  Component Value Date   GLUCAP 345 (H) 07/05/2016   HGBA1C 10.0 (H) 12/05/2015    Review of Glycemic ControlResults for [name], [name] (MRN [id number]) as of 07/05/2016 09:59  Ref. Range 07/04/2016 12:44 07/04/2016 15:17 07/04/2016 16:58 07/04/2016 21:53 07/05/2016 06:50  Glucose-Capillary Latest Ref Range: 65 - 99 mg/dL 308 (H) 657 (H) 846 (H) 276 (H) 345 (H)   Diabetes history: Type 2 diabetes Outpatient Diabetes medications: Levemir 55 units bid, Novolog 0-50 units tid with meals Current orders for Inpatient glycemic control:  Lantus 20 units bid, Novolog sensitive tid with meals  Inpatient Diabetes Program Recommendations:    Please consider d/c of Lantus and change to Levemir 40 units bid (this was home basal insulin).  Also consider adding Novolog 8 units tid with meals (hold if patient eats less than 50%).  Thanks, Nichole Meager, [name], [name] Inpatient Diabetes Coordinator Pager [phone] (8a-5p)

## 2016-07-05 NOTE — Evaluation (Signed)
Physical Therapy Evaluation Patient Details Name: [name] MRN: [id number] DOB: Dec 11, 1960 Today's Date: 07/05/2016   History of Present Illness  55 yo female admitted with weakness on R side of body. CT negative. H/O PPM, R BKA, DM  Clinical Impression  The patient tolerated ambulation with RW. Recommend that she use AD currently.  BP supine =148/80. After ambulation=157/45. The patient had no complaints of dizziness and right leg strength appears WFL. Pt admitted with above diagnosis. Pt currently with functional limitations due to the deficits listed below (see PT Problem List). Pt will benefit from skilled PT to increase their independence and safety with mobility to allow discharge to the venue listed below.       Follow Up Recommendations Home health PT;Supervision - Intermittent (may not need HH if  no further symptoms)    Equipment Recommendations  None recommended by PT    Recommendations for Other Services       Precautions / Restrictions Precautions Precautions: Fall Precaution Comments: R prosthetic in room  Restrictions Weight Bearing Restrictions: No      Mobility  Bed Mobility Overal bed mobility: Modified Independent                Transfers Overall transfer level: Modified independent                  Ambulation/Gait Ambulation/Gait assistance: Supervision Ambulation Distance (Feet): 100 Feet Assistive device: Rolling walker (2 wheeled);None Gait Pattern/deviations: Step-through pattern     General Gait Details: ambulated x 15' with no AD, gait is unsteady,  needs AD for safety.   Stairs            Wheelchair Mobility    Modified Rankin (Stroke Patients Only)       Balance   Sitting-balance support: Feet supported;No upper extremity supported Sitting balance-[name]: Normal Sitting balance - Comments: dond prosthesis sitting  on bed edge   Standing balance support: During functional activity;No upper  extremity supported Standing balance-[name]: Fair Standing balance comment: needs AD currently                             Pertinent Vitals/Pain Pain Assessment: No/denies pain    Home Living Family/patient expects to be discharged to:: Private residence Living Arrangements: Alone Available Help at Discharge: Personal care attendant;Available PRN/intermittently Type of Home: Apartment Home Access: Stairs to enter Entrance Stairs-Rails: Right;Left Entrance Stairs-Number of Steps: 2 at sidewalk with  bilateral rails and one to get in Home Layout: One level Home Equipment: Grab bars - tub/shower;Bedside commode;Shower seat;Wheelchair - manual;Hand held Careers information officer - 2 wheels;Cane - single point Additional Comments: aide 7 days week 1-2 hours/ aide does shopping    Prior Function Level of Independence: Independent with assistive device(s);Needs assistance   Gait / Transfers Assistance Needed: uses grab bars to ambulate into bathroom from w/c  ADL's / Homemaking Assistance Needed: aide now (A)  with dressing, bathing. has assist for household chores  Comments: uses w/c mainly, walks short distances with RW or cane,negotiates steps at entrance     Hand Dominance   Dominant Hand: Left    Extremity/Trunk Assessment   Upper Extremity Assessment: Overall WFL for tasks assessed           Lower Extremity Assessment: RLE deficits/detail RLE Deficits / Details: R BKA    Cervical / Trunk Assessment: Kyphotic  Communication   Communication: No difficulties  Cognition Arousal/Alertness: Awake/alert Behavior  During Therapy: WFL for tasks assessed/performed Overall Cognitive Status: Within Functional Limits for tasks assessed                      General Comments      Exercises     Assessment/Plan    PT Assessment Patient needs continued PT services  PT Problem List Decreased activity tolerance;Decreased balance;Decreased  mobility;Cardiopulmonary status limiting activity          PT Treatment Interventions Gait training;DME instruction;Functional mobility training;Therapeutic activities;Therapeutic exercise;Patient/family education    PT Goals (Current goals can be found in the Care Plan section)  Acute Rehab PT Goals Patient Stated Goal: to go home PT Goal Formulation: With patient Time For Goal Achievement: 07/19/16 Potential to Achieve Goals: Good    Frequency Min 3X/week   Barriers to discharge        Co-evaluation PT/OT/SLP Co-Evaluation/Treatment: Yes Reason for Co-Treatment: For patient/therapist safety PT goals addressed during session: Mobility/safety with mobility         End of Session Equipment Utilized During Treatment: Gait belt Activity Tolerance: Patient tolerated treatment well Patient left: in chair;with call bell/phone within reach;with chair alarm set Nurse Communication: Mobility status    Functional Assessment Tool Used: clinical judgement Functional Limitation: Mobility: Walking and moving around Mobility: Walking and Moving Around Current Status [phone]): At least 1 percent but less than 20 percent impaired, limited or restricted Mobility: Walking and Moving Around Goal Status [phone]): 0 percent impaired, limited or restricted    Time: 9702-6378 PT Time Calculation (min) (ACUTE ONLY): 27 min   Charges:   PT Evaluation $PT Eval Low Complexity: 1 Procedure     PT G Codes:   PT G-Codes **NOT FOR INPATIENT CLASS** Functional Assessment Tool Used: clinical judgement Functional Limitation: Mobility: Walking and moving around Mobility: Walking and Moving Around Current Status (H8850): At least 1 percent but less than 20 percent impaired, limited or restricted Mobility: Walking and Moving Around Goal Status [phone]): 0 percent impaired, limited or restricted    Rada Hay 07/05/2016, 11:21 AM Blanchard Kelch PT [phone]

## 2016-07-05 NOTE — Care Management Obs Status (Signed)
MEDICARE OBSERVATION STATUS NOTIFICATION   Patient Details  Name: [name] MRN: [id number] Date of Birth: 1961/07/15   Medicare Observation Status Notification Given:  Yes (CTH negative)    Kermit Balo, RN 07/05/2016, 4:04 PM

## 2016-07-05 NOTE — Progress Notes (Signed)
PROGRESS NOTE    [name]  YOV:[id number] DOB: 11-01-1960 DOA: 07/04/2016 PCP: Sid Falcon, MD Brief Narrative:This is a 55 year old female who has diabetes, hypertension, PVD, BKA who presents to the hospital with a recurrent focal neurologic deficit mainly localized on the right arm and right leg. Weakness resolved  Assessment & Plan: 1 . TIA -symptoms resolved -unable to have an MRI due to pacer -Neurology consulting -change ASA to 325mg  daily -Repeat CT head today -FU ECHO/Carotid duplex -LDL very high on crestor 40mg  -Fu Hab1c -Pt/OT/SLP eval pending  2. Type 2 diabetes mellitus.  -continue lantus at lower dose, SSI -FU Hab1c  3. Hypertension.  -Continue lisinopril.  4. BKA/PVD  5. Tobacco abuse -counseled, motivated to quit  6. Hypothyroidism.  -Continue levothyroxine  7. Chronic kidney disease stage III.  -stable  DVT prophylaxis:Lovenox Code Status:Full Code Family Communication:None at bedside Disposition Plan:Home pending workup   Consultants:   Neuro   Subjective: Feels well, no neurological symptoms now  Objective: Vitals:   07/05/16 1030 07/05/16 1036 07/05/16 1230 07/05/16 1511  BP: (!) 150/68 (!) 157/45 (!) 154/76 140/61  Pulse:  76 69 69  Resp:   18 18  Temp:   97.7 F (36.5 C) 98.3 F (36.8 C)  TempSrc:   Oral Oral  SpO2:   96% 94%  Weight:      Height:        Intake/Output Summary (Last 24 hours) at 07/05/16 1554 Last data filed at 07/05/16 1217  Gross per 24 hour  Intake                0 ml  Output                5 ml  Net               -5 ml   Filed Weights   07/04/16 1240 07/04/16 1705  Weight: 98.9 kg (218 lb) 99.9 kg (220 lb 3.2 oz)    Examination:  General exam: Appears calm and comfortable, AAOx3, no distress Respiratory system: Clear to auscultation. Respiratory effort normal. Cardiovascular system: S1 & S2 heard, RRR. No JVD, murmurs, rubs, gallops or clicks. No pedal  edema. Gastrointestinal system: Abdomen is nondistended, soft and nontender. No organomegaly or masses felt. Normal bowel sounds heard. Central nervous system: Alert and oriented. No focal neurological deficits. 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 Mood & affect appropriate.     Data Reviewed: I have personally reviewed following labs and imaging studies  CBC:  Recent Labs Lab 07/04/16 1308 07/05/16 0214  WBC 6.2 5.9  NEUTROABS 3.6  --   HGB 12.9 11.2*  HCT 37.3 33.1*  MCV 82.5 83.0  PLT 181 143*   Basic Metabolic Panel:  Recent Labs Lab 07/04/16 1308 07/05/16 0214  NA 137 136  K 4.3 3.7  CL 104 102  CO2 22 25  GLUCOSE 390* 323*  BUN 25* 23*  CREATININE 1.30* 1.18*  CALCIUM 9.0 8.6*   GFR: Estimated Creatinine Clearance (by C-G formula based on SCr of 1.18 mg/dL (H)) Female: 07/06/16 mL/min Female: 77.8 mL/min Liver Function Tests:  Recent Labs Lab 07/04/16 1308 07/05/16 0214  AST 23 18  ALT 17 16  ALKPHOS 98 83  BILITOT 0.3 0.4  PROT RESULTS UNAVAILABLE DUE TO INTERFERING SUBSTANCE 5.5*  ALBUMIN 3.0* 2.6*   No results for input(s): LIPASE, AMYLASE in the last 168 hours. No  results for input(s): AMMONIA in the last 168 hours. Coagulation Profile: No results for input(s): INR, PROTIME in the last 168 hours. Cardiac Enzymes: No results for input(s): CKTOTAL, CKMB, CKMBINDEX, TROPONINI in the last 168 hours. BNP (last 3 results) No results for input(s): PROBNP in the last 8760 hours. HbA1C: No results for input(s): HGBA1C in the last 72 hours. CBG:  Recent Labs Lab 07/04/16 1517 07/04/16 1658 07/04/16 2153 07/05/16 0650 07/05/16 1200  GLUCAP 288* 278* 276* 345* 370*   Lipid Profile:  Recent Labs  07/05/16 0214  CHOL 217*  HDL 23*  LDLCALC UNABLE TO CALCULATE IF TRIGLYCERIDE OVER 400 mg/dL  TRIG 9,381*  CHOLHDL 9.4   Thyroid Function Tests: No results for input(s): TSH,  T4TOTAL, FREET4, T3FREE, THYROIDAB in the last 72 hours. Anemia Panel: No results for input(s): VITAMINB12, FOLATE, FERRITIN, TIBC, IRON, RETICCTPCT in the last 72 hours. Urine analysis:    Component Value Date/Time   COLORURINE YELLOW 12/05/2015 0207   APPEARANCEUR CLOUDY (A) 12/05/2015 0207   LABSPEC 1.016 12/05/2015 0207   PHURINE 5.0 12/05/2015 0207   GLUCOSEU 100 (A) 12/05/2015 0207   HGBUR MODERATE (A) 12/05/2015 0207   HGBUR negative 03/25/2010 1147   BILIRUBINUR NEGATIVE 12/05/2015 0207   KETONESUR NEGATIVE 12/05/2015 0207   PROTEINUR >300 (A) 12/05/2015 0207   UROBILINOGEN 0.2 12/07/2014 2125   NITRITE NEGATIVE 12/05/2015 0207   LEUKOCYTESUR NEGATIVE 12/05/2015 0207   Sepsis Labs: @LABRCNTIP (procalcitonin:4,lacticidven:4)  )No results found for this or any previous visit (from the past 240 hour(s)).       Radiology Studies: Ct Head Wo Contrast  Result Date: 07/05/2016 CLINICAL DATA:  TIAs with right-sided weakness and slurred speech. Gait disturbance P EXAM: CT HEAD WITHOUT CONTRAST TECHNIQUE: Contiguous axial images were obtained from the base of the skull through the vertex without intravenous contrast. COMPARISON:  Head CT 07/04/2016 and previous FINDINGS: Brain: No sign of acute infarction, mass lesion, hemorrhage, hydrocephalus or extra-axial collection. 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 Vascular: No hyperdense vessels.  No vascular calcification. Skull: Normal Sinuses/Orbits: Clear/normal Other: None significant IMPRESSION: No acute finding by CT. 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 Electronically Signed   By: 07/06/2016 M.D.   On: 07/05/2016 13:09   Ct Head Wo Contrast  Result Date: 07/04/2016 CLINICAL DATA:  Four day history of right-sided arm and leg weakness. Slurred speech. EXAM: CT HEAD WITHOUT  CONTRAST TECHNIQUE: Contiguous axial images were obtained from the base of the skull through the vertex without intravenous contrast. COMPARISON:  October 21, 2015 FINDINGS: Brain: Mild atrophy for age is stable. There is no intracranial mass, hemorrhage, extra-axial fluid collection, or midline shift. There is mild patchy small vessel disease in the centra semiovale bilaterally. Elsewhere gray-white compartments appear unremarkable and stable. No acute infarct is evident on this study. Vascular: There is no hyperdense vessel. There is mild calcification in the distal carotid siphon region on the left. Skull: Bony calvarium appears intact. Sinuses/Orbits: Visualized paranasal sinuses are clear. Orbits appear symmetric bilaterally Other: Mastoid air cells are clear. IMPRESSION: Mild atrophy with mild patchy periventricular small vessel disease, stable. No acute infarct evident. No mass, hemorrhage, or extra-axial fluid collection. There is mild calcification in the distal left carotid siphon region. Electronically Signed   By: October 23, 2015 III M.D.   On: 07/04/2016 15:30  Scheduled Meds: .  stroke: mapping our early stages of recovery book   Does not apply Once  . aspirin EC  81 mg Oral Daily  . carvedilol  3.125 mg Oral BID WC  . enoxaparin (LOVENOX) injection  40 mg Subcutaneous Q24H  . FLUoxetine  60 mg Oral Daily  . insulin aspart  0-9 Units Subcutaneous TID WC  . insulin glargine  20 Units Subcutaneous BID  . levothyroxine  50 mcg Oral QAC breakfast  . lisinopril  5 mg Oral Daily  . metoCLOPramide  10 mg Oral TID AC & HS  . oxybutynin  10 mg Oral Daily  . pantoprazole  40 mg Oral Daily  . rosuvastatin  40 mg Oral Daily  . sodium chloride flush  3 mL Intravenous Q12H  . sodium chloride flush  3 mL Intravenous Q12H   Continuous Infusions:   LOS: 0 days    Time spent:    Zannie Cove, MD [facility] Pager [phone]  If 7PM-7AM, please contact  night-coverage [email or web address] Password [facility] 07/05/2016, 3:54 PM

## 2016-07-06 ENCOUNTER — Observation Stay (HOSPITAL_BASED_OUTPATIENT_CLINIC_OR_DEPARTMENT_OTHER): Payer: Medicare HMO

## 2016-07-06 ENCOUNTER — Observation Stay (HOSPITAL_COMMUNITY): Payer: Medicare HMO

## 2016-07-06 DIAGNOSIS — G458 Other transient cerebral ischemic attacks and related syndromes: Secondary | ICD-10-CM

## 2016-07-06 DIAGNOSIS — I5032 Chronic diastolic (congestive) heart failure: Secondary | ICD-10-CM | POA: Diagnosis not present

## 2016-07-06 DIAGNOSIS — G459 Transient cerebral ischemic attack, unspecified: Secondary | ICD-10-CM

## 2016-07-06 DIAGNOSIS — D638 Anemia in other chronic diseases classified elsewhere: Secondary | ICD-10-CM | POA: Diagnosis not present

## 2016-07-06 LAB — GLUCOSE, CAPILLARY
GLUCOSE-CAPILLARY: 246 mg/dL — AB (ref 65–99)
GLUCOSE-CAPILLARY: 260 mg/dL — AB (ref 65–99)
Glucose-Capillary: 367 mg/dL — ABNORMAL HIGH (ref 65–99)

## 2016-07-06 LAB — ECHOCARDIOGRAM COMPLETE
HEIGHTINCHES: 65 in
WEIGHTICAEL: 3523.2 [oz_av]

## 2016-07-06 LAB — HEMOGLOBIN A1C
Hgb A1c MFr Bld: 12.6 % — ABNORMAL HIGH (ref 4.8–5.6)
Mean Plasma Glucose: 315 mg/dL

## 2016-07-06 MED ORDER — ASPIRIN 325 MG PO TBEC: 325.0000 mg | DELAYED_RELEASE_TABLET | Freq: Every day | ORAL | 0 refills | Status: DC

## 2016-07-06 NOTE — Discharge Summary (Signed)
Physician Discharge Summary  [name] HFW:[id number] DOB: [birth year] DOA: 07/04/2016  PCP: Sid Falcon, MD  Admit date: 07/04/2016 Discharge date: 07/06/2016  Time spent: 45 minutes  Recommendations for Outpatient Follow-up:  1. PCP Dr.Kalish in 1 week, needs better DM control 2. Neurology Dr.Sethi in 64month, please FU results of TCD 3. L foot diabetic ulcer-followed by Dr.Duda  Discharge Diagnoses:  Principal Problem:   TIA (transient ischemic attack) Active Problems:   Diabetes mellitus type 2, uncontrolled, with complications (HCC)   TOBACCO ABUSE   Anemia of chronic disease   Hypothyroidism   Chronic diastolic heart failure (HCC)   OSA on CPAP with oxygen   COPD (chronic obstructive pulmonary disease), on home O2   Diabetic foot ulcer (HCC)   CKD (chronic kidney disease) stage 2, GFR 60-89 ml/min   BKA  Discharge Condition: stable  Diet recommendation: heart healthy/diabetic  Filed Weights   07/04/16 1240 07/04/16 1705  Weight: 98.9 kg (218 lb) 99.9 kg (220 lb 3.2 oz)    History of present illness:  This is a 55 year old female who has diabetes, hypertension, PVD, BKA who presents to the hospital with a recurrent focal neurologic deficit mainly localized on the right arm and right leg  Hospital Course:  1 . TIA -symptoms resolved -unable to have an MRI due to pacer -Neurology consulted, started on ASA 325mg  -Repeat CT head without acute findings -2D ECHO normal EF and wall motion -Carotid duplex-no significant stenosis -TCD ordered and pending-needs FU -LDL very high on crestor 40mg  --Hba1c 12.6 -Pt/OT eval completed, Home health PT recommended and set up   2. Type 2 diabetes mellitus.  -continue lantus at home dose -Hab1c 12.6 -needs much better long term control for DM  3. Hypertension.  -stable, continued on lisinopril.  4. BKA/PVD and diabetic foot ulcer on plantar surface -no s/s of infection -follow up with Dr.Duda, advised  patient to monitor this for s/s of infection  5. Tobacco abuse -counseled, motivated to quit  6. Hypothyroidism.  -Continue levothyroxine  7. Chronic kidney disease stage III. -stable  8. Chronic diastolic CHF -compensated, lasix resumed at home dose of 20mg    Consultations:  Neurology  Discharge Exam: Vitals:   07/06/16 0946 07/06/16 1338  BP: 126/61 125/60  Pulse: 100 69  Resp: 20 16  Temp: 97.8 F (36.6 C) 98.2 F (36.8 C)    General: AAOx3 Cardiovascular: S1S2/RRR Respiratory: CTAB  Discharge Instructions   Discharge Instructions    Diet - low sodium heart healthy    Complete by:  As directed    Diet Carb Modified    Complete by:  As directed    Increase activity slowly    Complete by:  As directed      Current Discharge Medication List    START taking these medications   Details  aspirin EC 325 MG EC tablet Take 1 tablet (325 mg total) by mouth daily. Refills: 0      CONTINUE these medications which have NOT CHANGED   Details  carvedilol (COREG) 3.125 MG tablet Take 1 tablet (3.125 mg total) by mouth 2 (two) times daily with a meal. Qty: 30 tablet, Refills: 0    FLUoxetine (PROZAC) 20 MG tablet Take 60 mg by mouth daily. Refills: 0    furosemide (LASIX) 40 MG tablet Take 0.5 tablets (20 mg total) by mouth daily. Qty: 30 tablet, Refills: 0    insulin aspart (NOVOLOG FLEXPEN) 100 UNIT/ML FlexPen Inject 0-50 Units into the skin  3 (three) times daily with meals. Per sliding scale    insulin detemir (LEVEMIR) 100 UNIT/ML injection Inject 0.55 mLs (55 Units total) into the skin 2 (two) times daily. Qty: 10 mL, Refills: 0    levothyroxine (SYNTHROID, LEVOTHROID) 25 MCG tablet Take 2 tablets (50 mcg total) by mouth every morning. Qty: 30 tablet, Refills: 0    lisinopril (PRINIVIL,ZESTRIL) 5 MG tablet Take 5 mg by mouth daily.    metoCLOPramide (REGLAN) 10 MG tablet Take 10 mg by mouth 4 (four) times daily. Take 1 tablet (10 mg) by mouth 30  minutes before meals and at bedtime Refills: 11    omeprazole (PRILOSEC) 40 MG capsule Take 40 mg by mouth at bedtime. Refills: 3    oxybutynin (DITROPAN-XL) 5 MG 24 hr tablet Take 10 mg by mouth daily.     rosuvastatin (CRESTOR) 20 MG tablet Take 40 mg by mouth daily.  Refills: 6    acyclovir ointment (ZOVIRAX) 5 % Apply topically every 3 (three) hours. Qty: 5 g, Refills: 0    amLODipine (NORVASC) 10 MG tablet Take 1 tablet (10 mg total) by mouth daily. Qty: 30 tablet, Refills: 0      STOP taking these medications     docusate sodium (COLACE) 100 MG capsule      doxycycline (VIBRA-TABS) 100 MG tablet      hydrALAZINE (APRESOLINE) 25 MG tablet      HYDROcodone-acetaminophen (NORCO/VICODIN) 5-325 MG tablet      valACYclovir (VALTREX) 1000 MG tablet        Allergies  Allergen Reactions  . Erythromycin Diarrhea  . Iodine-131 Nausea And Vomiting  . Gadolinium Nausea And Vomiting     Code: VOM, Desc: Pt began vomiting immed post infusion of multihance, Onset Date: [id number]   . Ivp Dye [Iodinated Diagnostic Agents] Nausea And Vomiting       . Metformin Diarrhea  . Penicillins Hives    Has patient had a PCN reaction causing immediate rash, facial/tongue/throat swelling, SOB or lightheadedness with hypotension: No Has patient had a PCN reaction causing severe rash involving mucus membranes or skin necrosis: No Has patient had a PCN reaction that required hospitalization No Has patient had a PCN reaction occurring within the last 10 years: No If all of the above answers are "NO", then may proceed with Cephalosporin use.    Follow-up Information    Sid Falcon, MD. Schedule an appointment as soon as possible for a visit in 1 week(s).   Specialty:  Family Medicine Contact information: [address] [place] [address] [phone]        Delia Heady, MD. Schedule an appointment as soon as possible for a visit in 1 month(s).   Specialties:   Neurology, Radiology Contact information: [address] [place] [address] [phone]            The results of significant diagnostics from this hospitalization (including imaging, microbiology, ancillary and laboratory) are listed below for reference.    Significant Diagnostic Studies: Ct Head Wo Contrast  Result Date: 07/05/2016 CLINICAL DATA:  TIAs with right-sided weakness and slurred speech. Gait disturbance P EXAM: CT HEAD WITHOUT CONTRAST TECHNIQUE: Contiguous axial images were obtained from the base of the skull through the vertex without intravenous contrast. COMPARISON:  Head CT 07/04/2016 and previous FINDINGS: Brain: No sign of acute infarction, mass lesion, hemorrhage, hydrocephalus or extra-axial collection. 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 Vascular: No hyperdense vessels.  No vascular calcification. Skull: Normal Sinuses/Orbits: Clear/normal Other: None significant IMPRESSION: No acute finding by CT. 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 Electronically Signed   By: Paulina Fusi M.D.   On: 07/05/2016 13:09   Ct Head Wo Contrast  Result Date: 07/04/2016 CLINICAL DATA:  Four day history of right-sided arm and leg weakness. Slurred speech. EXAM: CT HEAD WITHOUT CONTRAST TECHNIQUE: Contiguous axial images were obtained from the base of the skull through the vertex without intravenous contrast. COMPARISON:  October 21, 2015 FINDINGS: Brain: Mild atrophy for age is stable. There is no intracranial mass, hemorrhage, extra-axial fluid collection, or midline shift. There is mild patchy small vessel disease in the centra semiovale bilaterally. Elsewhere gray-white compartments appear unremarkable and stable. No acute infarct is evident on this study. Vascular: There is no hyperdense vessel. There is mild calcification  in the distal carotid siphon region on the left. Skull: Bony calvarium appears intact. Sinuses/Orbits: Visualized paranasal sinuses are clear. Orbits appear symmetric bilaterally Other: Mastoid air cells are clear. IMPRESSION: Mild atrophy with mild patchy periventricular small vessel disease, stable. No acute infarct evident. No mass, hemorrhage, or extra-axial fluid collection. There is mild calcification in the distal left carotid siphon region. Electronically Signed   By: Bretta Bang III M.D.   On: 07/04/2016 15:30    Microbiology: No results found for this or any previous visit (from the past 240 hour(s)).   Labs: Basic Metabolic Panel:  Recent Labs Lab 07/04/16 1308 07/05/16 0214  NA 137 136  K 4.3 3.7  CL 104 102  CO2 22 25  GLUCOSE 390* 323*  BUN 25* 23*  CREATININE 1.30* 1.18*  CALCIUM 9.0 8.6*   Liver Function Tests:  Recent Labs Lab 07/04/16 1308 07/05/16 0214  AST 23 18  ALT 17 16  ALKPHOS 98 83  BILITOT 0.3 0.4  PROT RESULTS UNAVAILABLE DUE TO INTERFERING SUBSTANCE 5.5*  ALBUMIN 3.0* 2.6*   No results for input(s): LIPASE, AMYLASE in the last 168 hours. No results for input(s): AMMONIA in the last 168 hours. CBC:  Recent Labs Lab 07/04/16 1308 07/05/16 0214  WBC 6.2 5.9  NEUTROABS 3.6  --   HGB 12.9 11.2*  HCT 37.3 33.1*  MCV 82.5 83.0  PLT 181 143*   Cardiac Enzymes: No results for input(s): CKTOTAL, CKMB, CKMBINDEX, TROPONINI in the last 168 hours. BNP: BNP (last 3 results) No results for input(s): BNP in the last 8760 hours.  ProBNP (last 3 results) No results for input(s): PROBNP in the last 8760 hours.  CBG:  Recent Labs Lab 07/05/16 1200 07/05/16 1652 07/05/16 2128 07/06/16 0617 07/06/16 1127  GLUCAP 370* 335* 310* 246* 367*       SignedZannie Cove MD.  [facility] 07/06/2016, 3:44 PM

## 2016-07-06 NOTE — Progress Notes (Signed)
STROKE TEAM PROGRESS NOTE   HISTORY OF PRESENT ILLNESS (per record) This is a 55 year old right-handed woman who presents for evaluation of right-sided weakness. History is obtained directly from the patient was Historian.  The patient reports that over the past 4 days, she has had intermittent episodes of weakness involving the right side of her body. This involves both the arm and leg but she has not appreciated any symptoms in her face. However, the weakness is accompanied by some slurred speech. She reports one episode over each of the past 4 days. These range in duration from five to ten minutes and completely resolve in between. There is no clear provocation for the symptoms and they seem to occur without any clear association to time of day or activity. She has never had similar symptoms in the past. She denies any prior history of stroke. She is presently asymptomatic.  Patient was not administered IV t-PA secondary to no stroke symptoms. She was admitted for further evaluation and treatment.   SUBJECTIVE (INTERVAL HISTORY) Doing well. No longer having any weakness, speech problem, or other problems. Ready to go home.    OBJECTIVE Temp:  [97.7 F (36.5 C)-98.5 F (36.9 C)] 97.8 F (36.6 C) (11/16 0946) Pulse Rate:  [69-100] 100 (11/16 0946) Cardiac Rhythm: Heart block (11/16 0700) Resp:  [16-20] 20 (11/16 0946) BP: (126-189)/(61-89) 126/61 (11/16 0946) SpO2:  [93 %-99 %] 93 % (11/16 0946)  CBC:   Recent Labs Lab 07/04/16 1308 07/05/16 0214  WBC 6.2 5.9  NEUTROABS 3.6  --   HGB 12.9 11.2*  HCT 37.3 33.1*  MCV 82.5 83.0  PLT 181 143*    Basic Metabolic Panel:   Recent Labs Lab 07/04/16 1308 07/05/16 0214  NA 137 136  K 4.3 3.7  CL 104 102  CO2 22 25  GLUCOSE 390* 323*  BUN 25* 23*  CREATININE 1.30* 1.18*  CALCIUM 9.0 8.6*    Lipid Panel:     Component Value Date/Time   CHOL 217 (H) 07/05/2016 0214   TRIG 1,090 (H) 07/05/2016 0214   HDL 23 (L)  07/05/2016 0214   CHOLHDL 9.4 07/05/2016 0214   VLDL UNABLE TO CALCULATE IF TRIGLYCERIDE OVER 400 mg/dL 16/02/3709 6269   LDLCALC UNABLE TO CALCULATE IF TRIGLYCERIDE OVER 400 mg/dL 48/54/6270 3500   XFGH8E:  Lab Results  Component Value Date   HGBA1C 12.6 (H) 07/05/2016   Urine Drug Screen:     Component Value Date/Time   LABOPIA NONE DETECTED 10/13/2015 0133   COCAINSCRNUR NONE DETECTED 10/13/2015 0133   LABBENZ NONE DETECTED 10/13/2015 0133   AMPHETMU NONE DETECTED 10/13/2015 0133   THCU NONE DETECTED 10/13/2015 0133   LABBARB NONE DETECTED 10/13/2015 0133      IMAGING  Ct Head Wo Contrast  Result Date: 07/05/2016 CLINICAL DATA:  TIAs with right-sided weakness and slurred speech. Gait disturbance P EXAM: CT HEAD WITHOUT CONTRAST TECHNIQUE: Contiguous axial images were obtained from the base of the skull through the vertex without intravenous contrast. COMPARISON:  Head CT 07/04/2016 and previous FINDINGS: Brain: No sign of acute infarction, mass lesion, hemorrhage, hydrocephalus or extra-axial collection. 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 Vascular: No hyperdense vessels.  No vascular calcification. Skull: Normal Sinuses/Orbits: Clear/normal Other: None significant IMPRESSION: No acute finding by CT. 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 Electronically Signed  By: Paulina Fusi M.D.   On: 07/05/2016 13:09   Ct Head Wo Contrast  Result Date: 07/04/2016 CLINICAL DATA:  Four day history of right-sided arm and leg weakness. Slurred speech. EXAM: CT HEAD WITHOUT CONTRAST TECHNIQUE: Contiguous axial images were obtained from the base of the skull through the vertex without intravenous contrast. COMPARISON:  October 21, 2015 FINDINGS: Brain: Mild atrophy for age is stable. There is no intracranial mass, hemorrhage, extra-axial fluid  collection, or midline shift. There is mild patchy small vessel disease in the centra semiovale bilaterally. Elsewhere gray-white compartments appear unremarkable and stable. No acute infarct is evident on this study. Vascular: There is no hyperdense vessel. There is mild calcification in the distal carotid siphon region on the left. Skull: Bony calvarium appears intact. Sinuses/Orbits: Visualized paranasal sinuses are clear. Orbits appear symmetric bilaterally Other: Mastoid air cells are clear. IMPRESSION: Mild atrophy with mild patchy periventricular small vessel disease, stable. No acute infarct evident. No mass, hemorrhage, or extra-axial fluid collection. There is mild calcification in the distal left carotid siphon region. Electronically Signed   By: Bretta Bang III M.D.   On: 07/04/2016 15:30     PHYSICAL EXAM Pleasant middle aged Caucasian lady not in distress. . Afebrile. Head is nontraumatic. Neck is supple without bruit.    Cardiac exam no murmur or gallop. Lungs are clear to auscultation. Distal pulses are not  Felt. 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 Neurological Exam ;  Awake  Alert oriented x 3. Normal speech and language.eye movements full without nystagmus.fundi were not visualized. Vision acuity and fields appear normal. Hearing is normal. Palatal movements are normal. Face symmetric. Tongue midline. Normal strength, tone, reflexes and coordination. Normal sensation. Gait deferred.  ASSESSMENT/PLAN Ms. MEGHIN THIVIERGE is a 55 y.o. female with history of diabetes and hypertension presenting with intermittent right-sided weakness and slurred speech. She did not receive IV t-PA.   Possible Stroke vs TIA due to small vessel disease   MRI  / MRA  Pacer  Had 2 ct heads 24 hours apart which did not show any stroke.   Carotid Doppler  Negative.   2D Echo  pending   Check TCD to look at  vasculature  LDL UTC  HgbA1c 12.6  Lovenox 40 mg sq daily for VTE prophylaxis Diet heart healthy/carb modified Room service appropriate? Yes; Fluid consistency: Thin  No antithrombotic prior to admission, now on aspirin 81 mg daily . Recommend increase aspirin dose to 325 mg daily   Patient counseled to be compliant with her antithrombotic medications  Ongoing aggressive stroke risk factor management  Therapy recommendations: home  Disposition: home  TCD pending. After that she can go home.   Hypertension  Stable Permissive hypertension (OK if < 220/120) but gradually normalize in 5-7 days Long-term BP goal normotensive  Hyperlipidemia  Home meds:  crestor 20,   Increased crestor to 40 when resumed in hospital  LDL UTC, goal < 70  Continue statin at discharge  Diabetes type II  HgbA1c 12.6, goal < 7.0  Diabetes Coordinator following.   Other Stroke Risk Factors  Cigarette smoker, advised to stop smoking  Obesity, Body mass index is 36.64 kg/m., recommend weight loss, diet and exercise as appropriate   Obstructive sleep apnea, on CPAP at home  CHF  PVD s/p R BKA  pacer  Other Active Problems  L foot diabetic ulcer  Hypothyroidism  CKD stage III  Hospital day # 0  Hyacinth Meeker M.D.  I have personally examined this patient, reviewed notes, independently viewed imaging studies, participated in medical decision making and plan of care.ROS completed by me personally and pertinent positives fully documented  I have made any additions or clarifications directly to the above note. Agree with note above.  Check echocardiogram and transcranial Doppler studies and discharged after that. Follow-up as an outpatient in stroke clinic in 6 weeks.  Delia Heady, MD Medical Director [facility] Pager: [phone] 07/06/2016 2:09 PM

## 2016-07-06 NOTE — Progress Notes (Signed)
Pt discharge education and instructions completed with pt at bedside; pt IV and telemetry removed; pt discharge home with family to transport her home. Pt transported off unit via wheelchair with belongings to the side. Dionne Bucy RN

## 2016-07-06 NOTE — Progress Notes (Addendum)
Recommend changing Lantus to Levemir and increase to 40 units BID (home insulin and dosage) Continue Novolog correction scale TID & HS. Will continue to monitor blood sugars while in the hospital. Smith Mince RN BSN CDE

## 2016-07-06 NOTE — Care Management Note (Signed)
Case Management Note  Patient Details  Name: [name] MRN: [id number] Date of Birth: 31-Jan-1961  Subjective/Objective:                    Action/Plan: Pt discharging home with orders for [facility] services. CM met with the patient and provided her a list of [place] agencies in [place]. She selected [place]. Santiago Glad with [facility] notified and accepted the referral. Pt states she has transportation home. Bedside RN updated.   Expected Discharge Date:                  Expected Discharge Plan:  [place]  In-House Referral:     Discharge planning Services  CM Consult  Post Acute Care Choice:  Home Health Choice offered to:  Patient  DME Arranged:    DME Agency:     HH Arranged:  PT [place]:  [place]  Status of Service:  Completed, signed off  If discussed at [place] of Stay Meetings, dates discussed:    Additional Comments:  Pollie Friar, RN 07/06/2016, 4:51 PM

## 2016-07-06 NOTE — Progress Notes (Signed)
  Echocardiogram 2D Echocardiogram has been performed.  [name] R 07/06/2016, 11:22 AM

## 2016-07-06 NOTE — Progress Notes (Signed)
Preliminary results by tech - Carotid Duplex Completed. No evidence of a significant stenosis noted in bilateral carotid arteries. Vertebral demonstrated antegrade flow. Marilynne Halsted, BS, RDMS, RVT

## 2016-07-07 ENCOUNTER — Encounter (HOSPITAL_COMMUNITY): Payer: Self-pay

## 2016-07-07 ENCOUNTER — Emergency Department (HOSPITAL_COMMUNITY)
Admission: EM | Admit: 2016-07-07 | Discharge: 2016-07-08 | Disposition: A | Discharge status: Home / Self Care | Payer: Medicare HMO | Attending: Emergency Medicine | Admitting: Emergency Medicine

## 2016-07-07 ENCOUNTER — Emergency Department (HOSPITAL_COMMUNITY): Payer: Medicare HMO

## 2016-07-07 DIAGNOSIS — F17210 Nicotine dependence, cigarettes, uncomplicated: Secondary | ICD-10-CM | POA: Insufficient documentation

## 2016-07-07 DIAGNOSIS — J449 Chronic obstructive pulmonary disease, unspecified: Secondary | ICD-10-CM | POA: Insufficient documentation

## 2016-07-07 DIAGNOSIS — E039 Hypothyroidism, unspecified: Secondary | ICD-10-CM | POA: Diagnosis not present

## 2016-07-07 DIAGNOSIS — I5032 Chronic diastolic (congestive) heart failure: Secondary | ICD-10-CM | POA: Diagnosis not present

## 2016-07-07 DIAGNOSIS — R471 Dysarthria and anarthria: Secondary | ICD-10-CM | POA: Diagnosis not present

## 2016-07-07 DIAGNOSIS — Z950 Presence of cardiac pacemaker: Secondary | ICD-10-CM | POA: Diagnosis not present

## 2016-07-07 DIAGNOSIS — Z8673 Personal history of transient ischemic attack (TIA), and cerebral infarction without residual deficits: Secondary | ICD-10-CM | POA: Insufficient documentation

## 2016-07-07 DIAGNOSIS — E1140 Type 2 diabetes mellitus with diabetic neuropathy, unspecified: Secondary | ICD-10-CM | POA: Diagnosis not present

## 2016-07-07 DIAGNOSIS — R4781 Slurred speech: Secondary | ICD-10-CM | POA: Diagnosis present

## 2016-07-07 DIAGNOSIS — N182 Chronic kidney disease, stage 2 (mild): Secondary | ICD-10-CM | POA: Diagnosis not present

## 2016-07-07 DIAGNOSIS — I130 Hypertensive heart and chronic kidney disease with heart failure and stage 1 through stage 4 chronic kidney disease, or unspecified chronic kidney disease: Secondary | ICD-10-CM | POA: Diagnosis not present

## 2016-07-07 DIAGNOSIS — Z7982 Long term (current) use of aspirin: Secondary | ICD-10-CM | POA: Insufficient documentation

## 2016-07-07 DIAGNOSIS — Z794 Long term (current) use of insulin: Secondary | ICD-10-CM | POA: Insufficient documentation

## 2016-07-07 DIAGNOSIS — G459 Transient cerebral ischemic attack, unspecified: Secondary | ICD-10-CM | POA: Diagnosis not present

## 2016-07-07 LAB — CBC WITH DIFFERENTIAL/PLATELET
BASOS ABS: 0 10*3/uL (ref 0.0–0.1)
Basophils Relative: 0 %
EOS PCT: 1 %
Eosinophils Absolute: 0.1 10*3/uL (ref 0.0–0.7)
HEMATOCRIT: 35.6 % — AB (ref 36.0–46.0)
HEMOGLOBIN: 12.1 g/dL (ref 12.0–15.0)
LYMPHS ABS: 2.6 10*3/uL (ref 0.7–4.0)
LYMPHS PCT: 39 %
MCH: 28.1 pg (ref 26.0–34.0)
MCHC: 34 g/dL (ref 30.0–36.0)
MCV: 82.6 fL (ref 78.0–100.0)
Monocytes Absolute: 0.3 10*3/uL (ref 0.1–1.0)
Monocytes Relative: 5 %
NEUTROS ABS: 3.6 10*3/uL (ref 1.7–7.7)
NEUTROS PCT: 55 %
PLATELETS: 155 10*3/uL (ref 150–400)
RBC: 4.31 MIL/uL (ref 3.87–5.11)
RDW: 13.7 % (ref 11.5–15.5)
WBC: 6.7 10*3/uL (ref 4.0–10.5)

## 2016-07-07 LAB — COMPREHENSIVE METABOLIC PANEL
ALK PHOS: 86 U/L (ref 38–126)
ALT: 18 U/L (ref 14–54)
AST: 19 U/L (ref 15–41)
Albumin: 2.8 g/dL — ABNORMAL LOW (ref 3.5–5.0)
Anion gap: 9 (ref 5–15)
BUN: 26 mg/dL — AB (ref 6–20)
CALCIUM: 9.4 mg/dL (ref 8.9–10.3)
CHLORIDE: 105 mmol/L (ref 101–111)
CO2: 25 mmol/L (ref 22–32)
CREATININE: 1.13 mg/dL — AB (ref 0.44–1.00)
GFR, EST NON AFRICAN AMERICAN: 54 mL/min — AB (ref >60)
Glucose, Bld: 227 mg/dL — ABNORMAL HIGH (ref 65–99)
Potassium: 4.3 mmol/L (ref 3.5–5.1)
Sodium: 139 mmol/L (ref 135–145)
Total Bilirubin: 0.2 mg/dL — ABNORMAL LOW (ref 0.3–1.2)
Total Protein: 6 g/dL — ABNORMAL LOW (ref 6.5–8.1)

## 2016-07-07 MED ORDER — IOPAMIDOL (ISOVUE-370) INJECTION 76%
INTRAVENOUS | Status: AC
  Administered 2016-07-07: 50 mL
  Filled 2016-07-07: qty 50

## 2016-07-07 MED ORDER — SODIUM CHLORIDE 0.9 % IV SOLN
8.0000 mg | Freq: Once | INTRAVENOUS | Status: AC
  Administered 2016-07-07: 8 mg via INTRAVENOUS
  Filled 2016-07-07: qty 4

## 2016-07-07 NOTE — ED Notes (Signed)
Pt. Said she had a slight headache last night.... She wanted me to make a note in the chart...  notified

## 2016-07-07 NOTE — ED Notes (Signed)
Spoke with Zack in [place] to advise pt has IV and zofran, ready for transport

## 2016-07-07 NOTE — ED Provider Notes (Signed)
[facility]-EMERGENCY DEPT Provider Note   CSN: [id number] Arrival date & time: 07/07/16  1644     History   Chief Complaint Chief Complaint  Patient presents with  . Aphasia    HPI [name] is a 55 y.o. female.  HPI Discharged yesterday after work up for intermittent episodes of weakness and tingling which began last week then was admitted a few days ago. Last night had mild headache and nausea which went away spontaneously Then today was doing ok until 330, was trying to speak and had slurred speech and weakness on the right side, lasted 1 minute today.  No other symptoms. Similar to episodes had in hospital.     Pacemaker, can't get MRI and gets n/v with IV contrast   Past Medical History:  Diagnosis Date  . Anxiety   . Cellulitis   . CHF (congestive heart failure) (HCC)   . Chronic bronchitis (HCC)   . Complication of anesthesia 01/2013   "didn't know where I was; who I was; talking out the top of my head" (05-19-13)  . Concussion as a teenager   slight  . COPD (chronic obstructive pulmonary disease) (HCC)    wear oxygen at home  . Daily headache   . Depression    takes CYmbalta daily  . Diabetic neuropathy (HCC)   . Diarrhea    pt takes Reglan tid  . GERD (gastroesophageal reflux disease)   . Hyperlipidemia   . Hypertension   . Hypothyroidism    takes Synthroid daily  . Kidney stone    "passed on before" (05/19/2013)  . MRSA (methicillin resistant staph aureus) culture positive    hx of 2012  . Neuromuscular disorder (HCC)   . Non-healing wound of lower extremity    lt foot  . Orthopnea    "just one; yesterday morning" (May 19, 2013)  . OSA on CPAP    and oxygen  . Peripheral vascular disease (HCC)   . Pneumonia 1980's   "hospitalized w/it" (2013-05-19)  . Rheumatoid arthritis (HCC)    "both knees" (2013-05-19)  . Staph infection 2007  . Symptomatic bradycardia 03/04/2014  . Thyroid disease   . Type II diabetes mellitus (HCC)    Novolog,Levemir,and  Lovaza daily    Patient Active Problem List   Diagnosis Date Noted  . TIA (transient ischemic attack) 07/04/2016  . Hyperkalemia 12/07/2015  . Diabetic foot infection (HCC) 12/05/2015  . Herpes simplex labialis   . Altered mental status   . Thrush 10/20/2015  . HSV-1 (herpes simplex virus 1) infection   . Hallucinations   . Acute respiratory failure with hypoxia (HCC) 10/14/2015  . Hyperglycemia 10/13/2015  . Diabetes mellitus due to underlying condition with hyperosmolarity without nonketotic hyperglycemic-hyperosmolar coma [facility]) (HCC) 10/13/2015  . CKD (chronic kidney disease) stage 2, GFR 60-89 ml/min 10/13/2015  . AKI (acute kidney injury) (HCC) 10/13/2015  . Diastolic CHF (HCC) 10/13/2015  . Acute encephalopathy 10/13/2015  . HHNC (hyperglycemic hyperosmolar nonketotic coma) (HCC) 10/13/2015  . Lactic acid acidosis   . Diabetic foot ulcer (HCC) 12/08/2014  . Symptomatic bradycardia 03/04/2014  . OSA on CPAP with oxygen 03/04/2014  . COPD (chronic obstructive pulmonary disease), on home O2 03/04/2014  . Bradycardia 03/04/2014  . Open wound of knee, leg (except thigh), and ankle, complicated 09/17/2013  . Cellulitis 08/16/2013  . Chronic diastolic heart failure (HCC) 08/16/2013  . Sepsis (HCC) 08/16/2013  . Left leg cellulitis 08/16/2013  . SOB (shortness of breath) 04/20/2013  . Proteinuria 01/26/2013  .  Acute on chronic kidney failure (HCC) 01/24/2013  . Abscess of groin, right 01/23/2013  . HTN (hypertension) 01/23/2013  . Leukocytosis 01/23/2013  . Anemia of chronic disease 01/23/2013  . Hypothyroidism 01/23/2013  . Hyponatremia 10/06/2011  . HYPERCHOLESTEROLEMIA 08/17/2010  . DIABETIC FOOT ULCER, TOE 02/18/2010  . DEPRESSION 02/01/2010  . TOBACCO ABUSE 06/04/2009  . Neuropathy (HCC) 01/30/2008  . Diabetes mellitus type 2, uncontrolled, with complications (HCC) 08/06/2007    Past Surgical History:  Procedure Laterality Date  . AMPUTATION    . BLADDER  SURGERY  1970's   "stretched the mouth of my bladder" (04/21/2013)  . DILATION AND CURETTAGE OF UTERUS  1980's  . FOOT SURGERY Right 2012   "put pins in one year; amputated toes another OR" (04/21/2013)  . GANGLION CYST EXCISION Left 1990's   "wrist" (04/21/2013)  . I&D EXTREMITY Left 08/25/2013   Procedure: IRRIGATION AND DEBRIDEMENT EXTREMITY;  Surgeon: Axel Filler, MD;  Location: [facility] OR;  Service: General;  Laterality: Left;  . INCISION AND DRAINAGE ABSCESS N/A 01/24/2013   Procedure: INCISION AND DRAINAGE ABSCESS;  Surgeon: Clovis Pu. Cornett, MD;  Location: [facility] OR;  Service: General;  Laterality: N/A;  . INGUINAL HERNIA REPAIR N/A 01/28/2013   Procedure: I&D Right Groin Wound;  Surgeon: Shelly Rubenstein, MD;  Location: [facility] OR;  Service: General;  Laterality: N/A;  . LEG AMPUTATION BELOW KNEE Right 2012  . PACEMAKER INSERTION  03/05/14   Biotronik dual chamber pacemaker implanted by Dr Graciela Husbands for symptomatic bradycardia  . PERMANENT PACEMAKER INSERTION N/A 03/05/2014   Procedure: PERMANENT PACEMAKER INSERTION;  Surgeon: Duke Salvia, MD;  Location: [facility] CATH LAB;  Service: Cardiovascular;  Laterality: N/A;  . REFRACTIVE SURGERY Bilateral 2014  . STUMP REVISION Right 12/11/2014   Procedure: Right Below Knee Amputation Revision;  Surgeon: Nadara Mustard, MD;  Location: [facility] OR;  Service: Orthopedics;  Laterality: Right;  . TOE AMPUTATION Left ?2011    only 2 toes remaning.   . TUBAL LIGATION  1990's  . WRIST SURGERY Right 1980's   "pinched nerve" (04/21/2013)    OB History    No data available       Home Medications    Prior to Admission medications   Medication Sig Start Date End Date Taking? Authorizing Provider  amLODipine (NORVASC) 10 MG tablet Take 1 tablet (10 mg total) by mouth daily. 12/08/15  Yes Leroy Sea, MD  aspirin EC 81 MG tablet Take 81 mg by mouth daily.   Yes Historical Provider, MD  carvedilol (COREG) 3.125 MG tablet Take 1 tablet (3.125 mg total) by mouth 2 (two)  times daily with a meal. 10/25/15  Yes Rolly Salter, MD  FLUoxetine (PROZAC) 20 MG tablet Take 60 mg by mouth daily. 11/24/15  Yes Historical Provider, MD  furosemide (LASIX) 40 MG tablet Take 0.5 tablets (20 mg total) by mouth daily. 10/25/15  Yes Rolly Salter, MD  insulin aspart (NOVOLOG FLEXPEN) 100 UNIT/ML FlexPen Inject 0-50 Units into the skin 3 (three) times daily with meals. Per sliding scale   Yes Historical Provider, MD  insulin detemir (LEVEMIR) 100 UNIT/ML injection Inject 0.55 mLs (55 Units total) into the skin 2 (two) times daily. Patient taking differently: Inject 45 Units into the skin 2 (two) times daily.  12/07/15  Yes Leroy Sea, MD  levothyroxine (SYNTHROID, LEVOTHROID) 25 MCG tablet Take 2 tablets (50 mcg total) by mouth every morning. 10/25/15  Yes Rolly Salter, MD  lisinopril (PRINIVIL,ZESTRIL) 5  MG tablet Take 5 mg by mouth daily.   Yes Historical Provider, MD  metoCLOPramide (REGLAN) 10 MG tablet Take 10 mg by mouth 4 (four) times daily. Take 1 tablet (10 mg) by mouth 30 minutes before meals and at bedtime 11/25/15  Yes Historical Provider, MD  omeprazole (PRILOSEC) 40 MG capsule Take 40 mg by mouth at bedtime. 11/22/15  Yes Historical Provider, MD  oxybutynin (DITROPAN-XL) 5 MG 24 hr tablet Take 10 mg by mouth daily.  11/22/15  Yes Historical Provider, MD  rosuvastatin (CRESTOR) 20 MG tablet Take 40 mg by mouth daily.  11/22/15  Yes Historical Provider, MD  acyclovir ointment (ZOVIRAX) 5 % Apply topically every 3 (three) hours. Patient not taking: Reported on 07/07/2016 12/07/15   Leroy Sea, MD  aspirin EC 325 MG EC tablet Take 1 tablet (325 mg total) by mouth daily. 07/07/16   Zannie Cove, MD    Family History Family History  Problem Relation Age of Onset  . Hypertension Mother   . Hyperlipidemia Mother   . Hypertrophic cardiomyopathy Mother     has pacer  . Alzheimer's disease Father   . Hypertension Brother   . Hyperlipidemia Brother   . Cancer Maternal  Aunt   . Diabetes Paternal Aunt   . Diabetes Paternal Uncle   . Diabetes Paternal Grandmother   . Anesthesia problems Neg Hx   . Hypotension Neg Hx   . Malignant hyperthermia Neg Hx   . Pseudochol deficiency Neg Hx     Social History Social History  Substance Use Topics  . Smoking status: Current Every Day Smoker    Packs/day: 1.00    Years: 33.00    Types: Cigarettes  . Smokeless tobacco: Never Used  . Alcohol use No     Allergies   Erythromycin; Iodine-131; Gadolinium; Ivp dye [iodinated diagnostic agents]; Metformin; and Penicillins   Review of Systems Review of Systems  Constitutional: Negative for fever.  HENT: Negative for sore throat.   Eyes: Negative for visual disturbance.  Respiratory: Negative for cough and shortness of breath.   Cardiovascular: Negative for chest pain.  Gastrointestinal: Negative for abdominal pain, nausea and vomiting.  Genitourinary: Negative for difficulty urinating.  Musculoskeletal: Negative for back pain and neck pain.  Skin: Negative for rash.  Neurological: Positive for speech difficulty and weakness. Negative for dizziness, syncope, facial asymmetry, numbness and headaches.     Physical Exam Updated Vital Signs BP 129/62 (BP Location: Right Arm)   Pulse 69   Temp 98 F (36.7 C) (Oral)   Resp 16   Ht 5\' 5"  (1.651 m)   Wt 220 lb (99.8 kg)   SpO2 96%   BMI 36.61 kg/m   Physical Exam  Constitutional: She is oriented to person, place, and time. She appears well-developed and well-nourished. No distress.  HENT:  Head: Normocephalic and atraumatic.  Eyes: Conjunctivae and EOM are normal.  Neck: Normal range of motion.  Cardiovascular: Normal rate, regular rhythm, normal heart sounds and intact distal pulses.  Exam reveals no gallop and no friction rub.   No murmur heard. Pulmonary/Chest: Effort normal and breath sounds normal. No respiratory distress. She has no wheezes. She has no rales.  Abdominal: Soft. She exhibits no  distension. There is no tenderness. There is no guarding.  Musculoskeletal: She exhibits no edema or tenderness.  Neurological: She is alert and oriented to person, place, and time. She has normal strength. No cranial nerve deficit or sensory deficit. Coordination and gait normal. GCS  eye subscore is 4. GCS verbal subscore is 5. GCS motor subscore is 6.  Normal EOM, normal peripheral vision  Skin: Skin is warm and dry. No rash noted. She is not diaphoretic. No erythema.  Nursing note and vitals reviewed.    ED Treatments / Results  Labs (all labs ordered are listed, but only abnormal results are displayed) Labs Reviewed  CBC WITH DIFFERENTIAL/PLATELET - Abnormal; Notable for the following:       Result Value   HCT 35.6 (*)    All other components within normal limits  COMPREHENSIVE METABOLIC PANEL - Abnormal; Notable for the following:    Glucose, Bld 227 (*)    BUN 26 (*)    Creatinine, Ser 1.13 (*)    Total Protein 6.0 (*)    Albumin 2.8 (*)    Total Bilirubin 0.2 (*)    GFR calc non Af Amer 54 (*)    All other components within normal limits    EKG  EKG Interpretation  Date/Time:  Friday July 07 2016 16:52:46 EST Ventricular Rate:  62 PR Interval:    QRS Duration: 95 QT Interval:  456 QTC Calculation: 464 R Axis:   -59 Text Interpretation:  Atrial-paced complexes Left anterior fascicular block Abnormal R-wave progression, late transition LVH with secondary repolarization abnormality No significant change since last tracing Confirmed by [facility] PlcCHLOSSMAN MD, Tyquez Hollibaugh ([id number]) on 07/07/2016 8:23:48 PM       Radiology Ct Angio Head W Or Wo Contrast  Result Date: 07/07/2016 CLINICAL DATA:  55 y/o F; right arm and leg numbness with difficulty speaking. EXAM: CT ANGIOGRAPHY HEAD AND NECK TECHNIQUE: Multidetector CT imaging of the head and neck was performed using the standard protocol during bolus administration of intravenous contrast. Multiplanar CT image reconstructions and  MIPs were obtained to evaluate the vascular anatomy. Carotid stenosis measurements (when applicable) are obtained utilizing NASCET criteria, using the distal internal carotid diameter as the denominator. CONTRAST:  50 cc Isovue 370 COMPARISON:  07/05/2016 CT head.  05/04/2011 MRA head. FINDINGS: CT HEAD FINDINGS Brain: No evidence of acute infarction, hemorrhage, hydrocephalus, extra-axial collection or mass lesion/mass effect. Mild chronic microvascular ischemic changes and parenchymal volume loss. Vascular: See below. Skull: Normal. Negative for fracture or focal lesion. Sinuses: Imaged portions are clear. Orbits: No acute finding. Review of the MIP images confirms the above findings CTA NECK FINDINGS Aortic arch: Bovine arch, normal variant. Imaged portion shows no evidence of aneurysm or dissection. No significant stenosis of the major arch vessel origins. Right carotid system: No evidence of dissection, stenosis (50% or greater) or occlusion. Calcified plaque at the bifurcation with mild 30% proximal ICA stenosis. Left carotid system: No evidence of dissection, stenosis (50% or greater) or occlusion. Mixed plaque of the carotid bifurcation with minimal stenosis. Vertebral arteries: Codominant. No evidence of dissection, stenosis (50% or greater) or occlusion. Skeleton: Mild cervical spondylosis. 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 Multilevel bony foraminal narrowing. Other neck: Left paramedian posterior lower neck subcutaneous fat cyst measuring 14 mm, probably a dermal appendage cyst. Upper chest: Clear lungs. Review of the MIP images confirms the above findings CTA HEAD FINDINGS Anterior circulation: No significant stenosis, proximal occlusion, aneurysm, or vascular malformation. Posterior circulation: No significant stenosis, proximal occlusion, aneurysm, or vascular malformation. Venous sinuses: As permitted by contrast timing,  patent. Anatomic variants: Hypoplastic right A1 and large anterior communicating artery, normal variant. Large left posterior communicating artery relatively small P1 segment compatible with persistent fetal circulation.  Diminutive right posterior communicating artery. Delayed phase: No abnormal intracranial enhancement. Review of the MIP images confirms the above findings IMPRESSION: 1. No acute intracranial abnormality or abnormal enhancement of the brain. 2. No evidence of dissection, hemodynamically significant stenosis, or occlusion of the carotid and vertebral arteries of the neck. 3. Bilateral carotid bifurcation atherosclerosis with mild approximately 30% stenosis. 4. Patent circle of Willis without evidence for significant stenosis, proximal occlusion, aneurysm, or vascular malformation. Electronically Signed   By: Mitzi Hansen M.D.   On: 07/07/2016 22:10   Ct Angio Neck W And/or Wo Contrast  Result Date: 07/07/2016 CLINICAL DATA:  55 y/o F; right arm and leg numbness with difficulty speaking. EXAM: CT ANGIOGRAPHY HEAD AND NECK TECHNIQUE: Multidetector CT imaging of the head and neck was performed using the standard protocol during bolus administration of intravenous contrast. Multiplanar CT image reconstructions and MIPs were obtained to evaluate the vascular anatomy. Carotid stenosis measurements (when applicable) are obtained utilizing NASCET criteria, using the distal internal carotid diameter as the denominator. CONTRAST:  50 cc Isovue 370 COMPARISON:  07/05/2016 CT head.  05/04/2011 MRA head. FINDINGS: CT HEAD FINDINGS Brain: No evidence of acute infarction, hemorrhage, hydrocephalus, extra-axial collection or mass lesion/mass effect. Mild chronic microvascular ischemic changes and parenchymal volume loss. Vascular: See below. Skull: Normal. Negative for fracture or focal lesion. Sinuses: Imaged portions are clear. Orbits: No acute finding. Review of the MIP images confirms the above  findings CTA NECK FINDINGS Aortic arch: Bovine arch, normal variant. Imaged portion shows no evidence of aneurysm or dissection. No significant stenosis of the major arch vessel origins. Right carotid system: No evidence of dissection, stenosis (50% or greater) or occlusion. Calcified plaque at the bifurcation with mild 30% proximal ICA stenosis. Left carotid system: No evidence of dissection, stenosis (50% or greater) or occlusion. Mixed plaque of the carotid bifurcation with minimal stenosis. Vertebral arteries: Codominant. No evidence of dissection, stenosis (50% or greater) or occlusion. Skeleton: Mild cervical spondylosis. 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 Multilevel bony foraminal narrowing. Other neck: Left paramedian posterior lower neck subcutaneous fat cyst measuring 14 mm, probably a dermal appendage cyst. Upper chest: Clear lungs. Review of the MIP images confirms the above findings CTA HEAD FINDINGS Anterior circulation: No significant stenosis, proximal occlusion, aneurysm, or vascular malformation. Posterior circulation: No significant stenosis, proximal occlusion, aneurysm, or vascular malformation. Venous sinuses: As permitted by contrast timing, patent. Anatomic variants: Hypoplastic right A1 and large anterior communicating artery, normal variant. Large left posterior communicating artery relatively small P1 segment compatible with persistent fetal circulation. Diminutive right posterior communicating artery. Delayed phase: No abnormal intracranial enhancement. Review of the MIP images confirms the above findings IMPRESSION: 1. No acute intracranial abnormality or abnormal enhancement of the brain. 2. No evidence of dissection, hemodynamically significant stenosis, or occlusion of the carotid and vertebral arteries of the neck. 3. Bilateral carotid bifurcation atherosclerosis with mild approximately 30% stenosis. 4. Patent  circle of Willis without evidence for significant stenosis, proximal occlusion, aneurysm, or vascular malformation. Electronically Signed   By: Mitzi Hansen M.D.   On: 07/07/2016 22:10    Procedures Procedures (including critical care time)  Medications Ordered in ED Medications  ondansetron (ZOFRAN) 8 mg in sodium chloride 0.9 % 50 mL IVPB (8 mg Intravenous Given 07/07/16 2046)  iopamidol (ISOVUE-370) 76 % injection (50 mLs  Contrast Given 07/07/16 2118)     Initial Impression / Assessment and Plan / ED Course  I have  reviewed the triage vital signs and the nursing notes.  Pertinent labs & imaging results that were available during my care of the patient were reviewed by me and considered in my medical decision making (see chart for details).  Clinical Course    55 year old female with a history of diabetes, hypothyroidism, chronic diastolic heart failure, obstructive sleep apnea, COPD, CK D, right BKA, smoking, hypertension with admission and discharge yesterday for concern of TIA with symptoms of difficulty speaking and right-sided weakness, presents with another episode of right-sided weakness and difficulty speaking. While in the hospital, patient was unable to obtain MRI given her pacemaker, however had carotid Dopplers and trans-cranial Dopplers which are still pending. She was initiated on aspirin 325. Given concerning repeat episode of TIA, discussed with neurology, and we agreed to pursue CT angiogram head and neck. Patient has nausea and vomiting listed as an intolerance to IV contrast, however history is not clear that this was reaction ton contrast, and in addition, denies any other allergic type symptoms, including no rash, no shortness of breath, no lightheadedness, no pruritus, no difficulty swallowing, voice changes or throat swelling. Given this, and benefit I feel we will have with CT, we will pretreat patient with Zofran for nausea and order CT Angio.  Discussed  with neurology that if there is no significant stenosis, patient may continue 325 mg of aspirin.  CT Angiography head and neck returned showing no significant stenosis, or other vascular abnormalities. Patient had no reaction to IV contrast with nausea medication.  Discussed with patient Dr. Catha Brow recommendation that if there is no significant stenosis or abnormality, she may continue to follow up as an outpatient, and continue aspirin 325 and other medications. Patient without any neurologic symptoms in ED. Patient discharged in stable condition with understanding of reasons to return.   Final Clinical Impressions(s) / ED Diagnoses   Final diagnoses:  Dysarthria  Transient cerebral ischemia, unspecified type    New Prescriptions New Prescriptions   No medications on file     Alvira Monday, MD 07/08/16 [phone]

## 2016-07-07 NOTE — ED Notes (Signed)
CALLED [facility] FOR TRANSPORT °

## 2016-07-07 NOTE — ED Notes (Signed)
Phleb at bedside to draw labs. 

## 2016-07-07 NOTE — ED Notes (Signed)
Pt stable, ambulatory, states understanding of discharge instructions, pt requests [facility] to transport home.

## 2016-07-07 NOTE — ED Notes (Signed)
Attempted IV access and blood draw x 1 w/o open success.

## 2016-07-07 NOTE — ED Triage Notes (Signed)
Per Pt, Pt is coming from home with complaints of slurred speech from her home care nurse when she went to evaluate her today at 1530. Pt was recently hospitalized with a similar episode. Symptoms lasted one minute and subsided. No Hx of Stroke. Hx of Diabetes, CHF, CBG 219, 143/80, 71 HR.

## 2016-07-08 LAB — VAS US CAROTID
LCCADDIAS: 17 cm/s
LEFT ECA DIAS: -15 cm/s
Left CCA dist sys: 62 cm/s
Left CCA prox dias: 18 cm/s
Left CCA prox sys: 70 cm/s
Left ICA dist dias: -34 cm/s
Left ICA dist sys: -94 cm/s
Left ICA prox dias: -26 cm/s
Left ICA prox sys: -74 cm/s
RCCADSYS: -94 cm/s
RCCAPDIAS: 13 cm/s
RCCAPSYS: 73 cm/s
RIGHT ECA DIAS: -14 cm/s
RIGHT VERTEBRAL DIAS: 14 cm/s

## 2016-07-08 NOTE — ED Notes (Signed)
[facility] at bedside 

## 2016-07-19 NOTE — Progress Notes (Signed)
OT NOTE- LATE GCODE ENTRY     07/29/2016 1000  OT G-codes **NOT FOR INPATIENT CLASS**  Functional Assessment Tool Used clincial judgement  Functional Limitation Self care  Self Care Current Status ([id number]) [facility]  Self Care Goal Status ([id number]) [facility]  Self Care Discharge Status [phone]) [facility]         Mateo Flow   OTR/L Pager: [phone] Office: [phone] .

## 2016-07-21 ENCOUNTER — Observation Stay (HOSPITAL_COMMUNITY)
Admission: EM | Admit: 2016-07-21 | Discharge: 2016-07-23 | Disposition: A | Discharge status: Home Health | Payer: Medicare HMO | Attending: Internal Medicine | Admitting: Internal Medicine

## 2016-07-21 ENCOUNTER — Emergency Department (HOSPITAL_COMMUNITY): Payer: Medicare HMO

## 2016-07-21 ENCOUNTER — Encounter (HOSPITAL_COMMUNITY): Payer: Self-pay | Admitting: Emergency Medicine

## 2016-07-21 DIAGNOSIS — E1142 Type 2 diabetes mellitus with diabetic polyneuropathy: Secondary | ICD-10-CM | POA: Insufficient documentation

## 2016-07-21 DIAGNOSIS — F329 Major depressive disorder, single episode, unspecified: Secondary | ICD-10-CM | POA: Diagnosis not present

## 2016-07-21 DIAGNOSIS — E039 Hypothyroidism, unspecified: Secondary | ICD-10-CM | POA: Diagnosis not present

## 2016-07-21 DIAGNOSIS — E7800 Pure hypercholesterolemia, unspecified: Secondary | ICD-10-CM | POA: Diagnosis not present

## 2016-07-21 DIAGNOSIS — I6523 Occlusion and stenosis of bilateral carotid arteries: Secondary | ICD-10-CM | POA: Insufficient documentation

## 2016-07-21 DIAGNOSIS — R200 Anesthesia of skin: Secondary | ICD-10-CM | POA: Diagnosis not present

## 2016-07-21 DIAGNOSIS — I5032 Chronic diastolic (congestive) heart failure: Secondary | ICD-10-CM | POA: Diagnosis not present

## 2016-07-21 DIAGNOSIS — Z888 Allergy status to other drugs, medicaments and biological substances status: Secondary | ICD-10-CM | POA: Diagnosis not present

## 2016-07-21 DIAGNOSIS — E1151 Type 2 diabetes mellitus with diabetic peripheral angiopathy without gangrene: Secondary | ICD-10-CM | POA: Diagnosis not present

## 2016-07-21 DIAGNOSIS — Z9981 Dependence on supplemental oxygen: Secondary | ICD-10-CM | POA: Insufficient documentation

## 2016-07-21 DIAGNOSIS — Z8673 Personal history of transient ischemic attack (TIA), and cerebral infarction without residual deficits: Secondary | ICD-10-CM | POA: Insufficient documentation

## 2016-07-21 DIAGNOSIS — K219 Gastro-esophageal reflux disease without esophagitis: Secondary | ICD-10-CM | POA: Diagnosis present

## 2016-07-21 DIAGNOSIS — M069 Rheumatoid arthritis, unspecified: Secondary | ICD-10-CM | POA: Diagnosis not present

## 2016-07-21 DIAGNOSIS — E1165 Type 2 diabetes mellitus with hyperglycemia: Secondary | ICD-10-CM | POA: Diagnosis not present

## 2016-07-21 DIAGNOSIS — G459 Transient cerebral ischemic attack, unspecified: Secondary | ICD-10-CM

## 2016-07-21 DIAGNOSIS — F17200 Nicotine dependence, unspecified, uncomplicated: Secondary | ICD-10-CM | POA: Diagnosis present

## 2016-07-21 DIAGNOSIS — F419 Anxiety disorder, unspecified: Secondary | ICD-10-CM | POA: Insufficient documentation

## 2016-07-21 DIAGNOSIS — R270 Ataxia, unspecified: Secondary | ICD-10-CM | POA: Diagnosis not present

## 2016-07-21 DIAGNOSIS — E118 Type 2 diabetes mellitus with unspecified complications: Secondary | ICD-10-CM

## 2016-07-21 DIAGNOSIS — Z89511 Acquired absence of right leg below knee: Secondary | ICD-10-CM | POA: Insufficient documentation

## 2016-07-21 DIAGNOSIS — J449 Chronic obstructive pulmonary disease, unspecified: Secondary | ICD-10-CM | POA: Insufficient documentation

## 2016-07-21 DIAGNOSIS — Z7982 Long term (current) use of aspirin: Secondary | ICD-10-CM | POA: Diagnosis not present

## 2016-07-21 DIAGNOSIS — I130 Hypertensive heart and chronic kidney disease with heart failure and stage 1 through stage 4 chronic kidney disease, or unspecified chronic kidney disease: Secondary | ICD-10-CM | POA: Insufficient documentation

## 2016-07-21 DIAGNOSIS — E1122 Type 2 diabetes mellitus with diabetic chronic kidney disease: Secondary | ICD-10-CM | POA: Diagnosis not present

## 2016-07-21 DIAGNOSIS — Z794 Long term (current) use of insulin: Secondary | ICD-10-CM | POA: Diagnosis not present

## 2016-07-21 DIAGNOSIS — N183 Chronic kidney disease, stage 3 (moderate): Secondary | ICD-10-CM | POA: Diagnosis not present

## 2016-07-21 DIAGNOSIS — Z8249 Family history of ischemic heart disease and other diseases of the circulatory system: Secondary | ICD-10-CM | POA: Insufficient documentation

## 2016-07-21 DIAGNOSIS — E781 Pure hyperglyceridemia: Secondary | ICD-10-CM | POA: Insufficient documentation

## 2016-07-21 DIAGNOSIS — G4733 Obstructive sleep apnea (adult) (pediatric): Secondary | ICD-10-CM | POA: Diagnosis not present

## 2016-07-21 DIAGNOSIS — F32A Depression, unspecified: Secondary | ICD-10-CM | POA: Diagnosis present

## 2016-07-21 DIAGNOSIS — I679 Cerebrovascular disease, unspecified: Secondary | ICD-10-CM

## 2016-07-21 DIAGNOSIS — Z950 Presence of cardiac pacemaker: Secondary | ICD-10-CM | POA: Insufficient documentation

## 2016-07-21 DIAGNOSIS — Z8614 Personal history of Methicillin resistant Staphylococcus aureus infection: Secondary | ICD-10-CM | POA: Insufficient documentation

## 2016-07-21 DIAGNOSIS — Z9119 Patient's noncompliance with other medical treatment and regimen: Secondary | ICD-10-CM | POA: Insufficient documentation

## 2016-07-21 DIAGNOSIS — I10 Essential (primary) hypertension: Secondary | ICD-10-CM | POA: Diagnosis present

## 2016-07-21 DIAGNOSIS — R531 Weakness: Secondary | ICD-10-CM | POA: Insufficient documentation

## 2016-07-21 DIAGNOSIS — F17210 Nicotine dependence, cigarettes, uncomplicated: Secondary | ICD-10-CM | POA: Insufficient documentation

## 2016-07-21 DIAGNOSIS — R739 Hyperglycemia, unspecified: Secondary | ICD-10-CM | POA: Diagnosis present

## 2016-07-21 DIAGNOSIS — H538 Other visual disturbances: Secondary | ICD-10-CM | POA: Insufficient documentation

## 2016-07-21 DIAGNOSIS — R51 Headache: Secondary | ICD-10-CM

## 2016-07-21 DIAGNOSIS — R519 Headache, unspecified: Secondary | ICD-10-CM

## 2016-07-21 DIAGNOSIS — G458 Other transient cerebral ischemic attacks and related syndromes: Secondary | ICD-10-CM | POA: Diagnosis not present

## 2016-07-21 DIAGNOSIS — Z880 Allergy status to penicillin: Secondary | ICD-10-CM | POA: Diagnosis not present

## 2016-07-21 DIAGNOSIS — IMO0002 Reserved for concepts with insufficient information to code with codable children: Secondary | ICD-10-CM

## 2016-07-21 LAB — CBC WITH DIFFERENTIAL/PLATELET
BASOS ABS: 0 10*3/uL (ref 0.0–0.1)
Basophils Relative: 0 %
Eosinophils Absolute: 0 10*3/uL (ref 0.0–0.7)
Eosinophils Relative: 1 %
HEMATOCRIT: 38.3 % (ref 36.0–46.0)
HEMOGLOBIN: 12.9 g/dL (ref 12.0–15.0)
LYMPHS PCT: 34 %
Lymphs Abs: 2.3 10*3/uL (ref 0.7–4.0)
MCH: 28.2 pg (ref 26.0–34.0)
MCHC: 33.7 g/dL (ref 30.0–36.0)
MCV: 83.6 fL (ref 78.0–100.0)
MONO ABS: 0.4 10*3/uL (ref 0.1–1.0)
MONOS PCT: 6 %
NEUTROS ABS: 4.1 10*3/uL (ref 1.7–7.7)
Neutrophils Relative %: 59 %
Platelets: 192 10*3/uL (ref 150–400)
RBC: 4.58 MIL/uL (ref 3.87–5.11)
RDW: 14.3 % (ref 11.5–15.5)
WBC: 6.9 10*3/uL (ref 4.0–10.5)

## 2016-07-21 LAB — I-STAT VENOUS BLOOD GAS, ED
Acid-Base Excess: 1 mmol/L (ref 0.0–2.0)
BICARBONATE: 26.2 mmol/L (ref 20.0–28.0)
O2 SAT: 71 %
PCO2 VEN: 42.5 mmHg — AB (ref 44.0–60.0)
PO2 VEN: 38 mmHg (ref 32.0–45.0)
TCO2: 28 mmol/L (ref 0–100)
pH, Ven: 7.398 (ref 7.250–7.430)

## 2016-07-21 LAB — URINALYSIS, ROUTINE W REFLEX MICROSCOPIC
Bilirubin Urine: NEGATIVE
GLUCOSE, UA: 500 mg/dL — AB
Hgb urine dipstick: NEGATIVE
KETONES UR: NEGATIVE mg/dL
LEUKOCYTES UA: NEGATIVE
Nitrite: NEGATIVE
PH: 6.5 (ref 5.0–8.0)
Protein, ur: 100 mg/dL — AB
SPECIFIC GRAVITY, URINE: 1.014 (ref 1.005–1.030)

## 2016-07-21 LAB — COMPREHENSIVE METABOLIC PANEL
ALBUMIN: 2.9 g/dL — AB (ref 3.5–5.0)
ALT: 19 U/L (ref 14–54)
ANION GAP: 11 (ref 5–15)
AST: 22 U/L (ref 15–41)
Alkaline Phosphatase: 99 U/L (ref 38–126)
BUN: 31 mg/dL — ABNORMAL HIGH (ref 6–20)
CO2: 25 mmol/L (ref 22–32)
Calcium: 9.3 mg/dL (ref 8.9–10.3)
Chloride: 97 mmol/L — ABNORMAL LOW (ref 101–111)
Creatinine, Ser: 1.38 mg/dL — ABNORMAL HIGH (ref 0.44–1.00)
GFR calc Af Amer: 49 mL/min — ABNORMAL LOW (ref >60)
GFR calc non Af Amer: 42 mL/min — ABNORMAL LOW (ref >60)
GLUCOSE: 457 mg/dL — AB (ref 65–99)
POTASSIUM: 5.1 mmol/L (ref 3.5–5.1)
SODIUM: 133 mmol/L — AB (ref 135–145)
Total Bilirubin: 0.2 mg/dL — ABNORMAL LOW (ref 0.3–1.2)
Total Protein: 6.7 g/dL (ref 6.5–8.1)

## 2016-07-21 LAB — URINE MICROSCOPIC-ADD ON

## 2016-07-21 LAB — CBG MONITORING, ED
GLUCOSE-CAPILLARY: 463 mg/dL — AB (ref 65–99)
GLUCOSE-CAPILLARY: 258 mg/dL — AB (ref 65–99)
Glucose-Capillary: 370 mg/dL — ABNORMAL HIGH (ref 65–99)

## 2016-07-21 LAB — GLUCOSE, CAPILLARY: Glucose-Capillary: 159 mg/dL — ABNORMAL HIGH (ref 65–99)

## 2016-07-21 MED ORDER — ROSUVASTATIN CALCIUM 20 MG PO TABS
40.0000 mg | ORAL_TABLET | Freq: Every day | ORAL | Status: DC
  Administered 2016-07-22 – 2016-07-23 (×2): 40 mg via ORAL
  Filled 2016-07-21 (×2): qty 2

## 2016-07-21 MED ORDER — ACYCLOVIR 5 % EX OINT
TOPICAL_OINTMENT | CUTANEOUS | Status: DC
  Filled 2016-07-21: qty 15

## 2016-07-21 MED ORDER — NICOTINE 21 MG/24HR TD PT24
21.0000 mg | MEDICATED_PATCH | Freq: Every day | TRANSDERMAL | Status: DC
  Filled 2016-07-21: qty 1

## 2016-07-21 MED ORDER — FENOFIBRATE 54 MG PO TABS
54.0000 mg | ORAL_TABLET | Freq: Every day | ORAL | Status: DC
  Administered 2016-07-21: 54 mg via ORAL
  Filled 2016-07-21 (×2): qty 1

## 2016-07-21 MED ORDER — INSULIN DETEMIR 100 UNIT/ML ~~LOC~~ SOLN
30.0000 [IU] | Freq: Two times a day (BID) | SUBCUTANEOUS | Status: DC
  Administered 2016-07-21 – 2016-07-22 (×2): 30 [IU] via SUBCUTANEOUS
  Filled 2016-07-21 (×4): qty 0.3

## 2016-07-21 MED ORDER — INSULIN ASPART 100 UNIT/ML ~~LOC~~ SOLN
10.0000 [IU] | Freq: Once | SUBCUTANEOUS | Status: AC
  Administered 2016-07-21: 10 [IU] via SUBCUTANEOUS
  Filled 2016-07-21: qty 1

## 2016-07-21 MED ORDER — OXYBUTYNIN CHLORIDE ER 10 MG PO TB24
10.0000 mg | ORAL_TABLET | Freq: Every day | ORAL | Status: DC
  Administered 2016-07-22 – 2016-07-23 (×2): 10 mg via ORAL
  Filled 2016-07-21 (×2): qty 1

## 2016-07-21 MED ORDER — LISINOPRIL 5 MG PO TABS
5.0000 mg | ORAL_TABLET | Freq: Every day | ORAL | Status: DC
  Administered 2016-07-22 – 2016-07-23 (×2): 5 mg via ORAL
  Filled 2016-07-21 (×2): qty 1

## 2016-07-21 MED ORDER — INSULIN ASPART 100 UNIT/ML ~~LOC~~ SOLN
0.0000 [IU] | Freq: Three times a day (TID) | SUBCUTANEOUS | Status: DC
  Administered 2016-07-22: 7 [IU] via SUBCUTANEOUS
  Administered 2016-07-22: 5 [IU] via SUBCUTANEOUS
  Administered 2016-07-22 – 2016-07-23 (×2): 9 [IU] via SUBCUTANEOUS
  Administered 2016-07-23: 5 [IU] via SUBCUTANEOUS

## 2016-07-21 MED ORDER — CARVEDILOL 3.125 MG PO TABS
3.1250 mg | ORAL_TABLET | Freq: Two times a day (BID) | ORAL | Status: DC
  Administered 2016-07-22 – 2016-07-23 (×3): 3.125 mg via ORAL
  Filled 2016-07-21 (×4): qty 1

## 2016-07-21 MED ORDER — ACETAMINOPHEN 325 MG PO TABS: 650.0000 mg | ORAL_TABLET | ORAL | Status: DC | PRN

## 2016-07-21 MED ORDER — ACETAMINOPHEN 650 MG RE SUPP: 650.0000 mg | RECTAL | Status: DC | PRN

## 2016-07-21 MED ORDER — STROKE: EARLY STAGES OF RECOVERY BOOK
Freq: Once | Status: DC
  Filled 2016-07-21: qty 1

## 2016-07-21 MED ORDER — SODIUM CHLORIDE 0.9 % IV BOLUS (SEPSIS)
1000.0000 mL | Freq: Once | INTRAVENOUS | Status: AC
  Administered 2016-07-21: 1000 mL via INTRAVENOUS

## 2016-07-21 MED ORDER — ZOLPIDEM TARTRATE 5 MG PO TABS: 5.0000 mg | ORAL_TABLET | Freq: Every evening | ORAL | Status: DC | PRN

## 2016-07-21 MED ORDER — METOCLOPRAMIDE HCL 10 MG PO TABS
10.0000 mg | ORAL_TABLET | Freq: Four times a day (QID) | ORAL | Status: DC
  Administered 2016-07-21 – 2016-07-22 (×2): 10 mg via ORAL
  Filled 2016-07-21 (×2): qty 1

## 2016-07-21 MED ORDER — PANTOPRAZOLE SODIUM 40 MG PO TBEC
40.0000 mg | DELAYED_RELEASE_TABLET | Freq: Every day | ORAL | Status: DC
  Administered 2016-07-22 – 2016-07-23 (×2): 40 mg via ORAL
  Filled 2016-07-21 (×2): qty 1

## 2016-07-21 MED ORDER — FUROSEMIDE 20 MG PO TABS
20.0000 mg | ORAL_TABLET | Freq: Every day | ORAL | Status: DC
  Administered 2016-07-22 – 2016-07-23 (×2): 20 mg via ORAL
  Filled 2016-07-21 (×2): qty 1

## 2016-07-21 MED ORDER — LEVOTHYROXINE SODIUM 50 MCG PO TABS
50.0000 ug | ORAL_TABLET | Freq: Every day | ORAL | Status: DC
  Administered 2016-07-22 – 2016-07-23 (×2): 50 ug via ORAL
  Filled 2016-07-21 (×2): qty 1

## 2016-07-21 MED ORDER — ONDANSETRON HCL 4 MG/2ML IJ SOLN
4.0000 mg | Freq: Three times a day (TID) | INTRAMUSCULAR | Status: DC | PRN
  Administered 2016-07-22: 4 mg via INTRAVENOUS
  Filled 2016-07-21: qty 2

## 2016-07-21 MED ORDER — AMLODIPINE BESYLATE 10 MG PO TABS
10.0000 mg | ORAL_TABLET | Freq: Every day | ORAL | Status: DC
  Administered 2016-07-22 – 2016-07-23 (×2): 10 mg via ORAL
  Filled 2016-07-21 (×2): qty 1

## 2016-07-21 MED ORDER — SENNOSIDES-DOCUSATE SODIUM 8.6-50 MG PO TABS: 1.0000 | ORAL_TABLET | Freq: Every evening | ORAL | Status: DC | PRN

## 2016-07-21 MED ORDER — ASPIRIN EC 325 MG PO TBEC
325.0000 mg | DELAYED_RELEASE_TABLET | Freq: Every day | ORAL | Status: DC
  Administered 2016-07-22: 325 mg via ORAL
  Filled 2016-07-21: qty 1

## 2016-07-21 MED ORDER — ENOXAPARIN SODIUM 40 MG/0.4ML ~~LOC~~ SOLN
40.0000 mg | SUBCUTANEOUS | Status: DC
  Administered 2016-07-22 – 2016-07-23 (×2): 40 mg via SUBCUTANEOUS
  Filled 2016-07-21 (×3): qty 0.4

## 2016-07-21 MED ORDER — FLUOXETINE HCL 20 MG PO CAPS
60.0000 mg | ORAL_CAPSULE | Freq: Every day | ORAL | Status: DC
  Administered 2016-07-22 – 2016-07-23 (×2): 60 mg via ORAL
  Filled 2016-07-21 (×3): qty 3

## 2016-07-21 NOTE — ED Provider Notes (Signed)
[facility]-EMERGENCY DEPT Provider Note   CSN: [id number] Arrival date & time: 07/21/16  1324     History   Chief Complaint Chief Complaint  Patient presents with  . Hyperglycemia  . Weakness    HPI [name] is a 55 y.o. female.  55 year old Caucasian female past medical history significant for TIA, diabetes, hypothyroidism, chronic diastolic heart failure (EF 60%-70% 07/05/16), CK D, right BKA, hypertension that presents to the ED today by EMS with complaint of hyperglycemia, left facial numbness and ataxia. Patient states that she developed left facial numbness and ataxia yesterday. States it has been persistent and has not resolved. Complains of being off balance to the right when she walks. Patient also endorses having a blood sugar of 500 today. States she has taken all of her meds including her insulin as prescribed. She endorses nausea but denies any vomiting. She denies any lightheadedness or dizziness. Patient was seen at the beginning of November and admitted for right sided weakness and slurred speech concerning for TIA. At that time patient was unable to obtain an MRI due to her pacemaker. She had carotid Dopplers performed that were without any significant stenosis. She was initiated on 325 aspirin given concern for TIA.  Patient was discharged home with recommendations to follow-up with neurology outpatient. She then presented to the ED the following day for similar symptoms. A CT angio of her head and neck was performed per the neurologist recommendations. CTA of head and neck was unremarkable. Again patient was recommended to follow up outpatient with neurology. Patient states she has not followed up with neurology. States that she has an appointment with her cardiologist and nephrologist next week. The patient denies any fever, chills, headache, vision changes, lightheadedness, dizziness, cough, chest pain, shortness of breath, extremity weakness, abdominal pain, nausea, emesis,  urinary symptoms, change in bowel habits, numbness/tingling.    .      Past Medical History:  Diagnosis Date  . Anxiety   . Cellulitis   . CHF (congestive heart failure) (HCC)   . Chronic bronchitis (HCC)   . Complication of anesthesia 01/2013   "didn't know where I was; who I was; talking out the top of my head" (May 13, 2013)  . Concussion as a teenager   slight  . COPD (chronic obstructive pulmonary disease) (HCC)    wear oxygen at home  . Daily headache   . Depression    takes CYmbalta daily  . Diabetic neuropathy (HCC)   . Diarrhea    pt takes Reglan tid  . GERD (gastroesophageal reflux disease)   . Hyperlipidemia   . Hypertension   . Hypothyroidism    takes Synthroid daily  . Kidney stone    "passed on before" (2013-05-13)  . MRSA (methicillin resistant staph aureus) culture positive    hx of 2012  . Neuromuscular disorder (HCC)   . Non-healing wound of lower extremity    lt foot  . Orthopnea    "just one; yesterday morning" (2013-05-13)  . OSA on CPAP    and oxygen  . Peripheral vascular disease (HCC)   . Pneumonia 1980's   "hospitalized w/it" (05-13-13)  . Rheumatoid arthritis (HCC)    "both knees" (May 13, 2013)  . Staph infection 2007  . Symptomatic bradycardia 03/04/2014  . Thyroid disease   . Type II diabetes mellitus (HCC)    Novolog,Levemir,and Lovaza daily    Patient Active Problem List   Diagnosis Date Noted  . TIA (transient ischemic attack) 07/04/2016  . Hyperkalemia  12/07/2015  . Diabetic foot infection (HCC) 12/05/2015  . Herpes simplex labialis   . Altered mental status   . Thrush 10/20/2015  . HSV-1 (herpes simplex virus 1) infection   . Hallucinations   . Acute respiratory failure with hypoxia (HCC) 10/14/2015  . Hyperglycemia 10/13/2015  . Diabetes mellitus due to underlying condition with hyperosmolarity without nonketotic hyperglycemic-hyperosmolar coma [facility]) (HCC) 10/13/2015  . CKD (chronic kidney disease) stage 2, GFR 60-89 ml/min  10/13/2015  . AKI (acute kidney injury) (HCC) 10/13/2015  . Diastolic CHF (HCC) 10/13/2015  . Acute encephalopathy 10/13/2015  . HHNC (hyperglycemic hyperosmolar nonketotic coma) (HCC) 10/13/2015  . Lactic acid acidosis   . Diabetic foot ulcer (HCC) 12/08/2014  . Symptomatic bradycardia 03/04/2014  . OSA on CPAP with oxygen 03/04/2014  . COPD (chronic obstructive pulmonary disease), on home O2 03/04/2014  . Bradycardia 03/04/2014  . Open wound of knee, leg (except thigh), and ankle, complicated 09/17/2013  . Cellulitis 08/16/2013  . Chronic diastolic heart failure (HCC) 08/16/2013  . Sepsis (HCC) 08/16/2013  . Left leg cellulitis 08/16/2013  . SOB (shortness of breath) 04/20/2013  . Proteinuria 01/26/2013  . Acute on chronic kidney failure (HCC) 01/24/2013  . Abscess of groin, right 01/23/2013  . HTN (hypertension) 01/23/2013  . Leukocytosis 01/23/2013  . Anemia of chronic disease 01/23/2013  . Hypothyroidism 01/23/2013  . Hyponatremia 10/06/2011  . HYPERCHOLESTEROLEMIA 08/17/2010  . DIABETIC FOOT ULCER, TOE 02/18/2010  . DEPRESSION 02/01/2010  . TOBACCO ABUSE 06/04/2009  . Neuropathy (HCC) 01/30/2008  . Diabetes mellitus type 2, uncontrolled, with complications (HCC) 08/06/2007    Past Surgical History:  Procedure Laterality Date  . AMPUTATION    . BLADDER SURGERY  1970's   "stretched the mouth of my bladder" (04/21/2013)  . DILATION AND CURETTAGE OF UTERUS  1980's  . FOOT SURGERY Right 2012   "put pins in one year; amputated toes another OR" (04/21/2013)  . GANGLION CYST EXCISION Left 1990's   "wrist" (04/21/2013)  . I&D EXTREMITY Left 08/25/2013   Procedure: IRRIGATION AND DEBRIDEMENT EXTREMITY;  Surgeon: Axel Filler, MD;  Location: [facility] OR;  Service: General;  Laterality: Left;  . INCISION AND DRAINAGE ABSCESS N/A 01/24/2013   Procedure: INCISION AND DRAINAGE ABSCESS;  Surgeon: Clovis Pu. Cornett, MD;  Location: [facility] OR;  Service: General;  Laterality: N/A;  . INGUINAL HERNIA  REPAIR N/A 01/28/2013   Procedure: I&D Right Groin Wound;  Surgeon: Shelly Rubenstein, MD;  Location: [facility] OR;  Service: General;  Laterality: N/A;  . LEG AMPUTATION BELOW KNEE Right 2012  . PACEMAKER INSERTION  03/05/14   Biotronik dual chamber pacemaker implanted by Dr Graciela Husbands for symptomatic bradycardia  . PERMANENT PACEMAKER INSERTION N/A 03/05/2014   Procedure: PERMANENT PACEMAKER INSERTION;  Surgeon: Duke Salvia, MD;  Location: [facility] CATH LAB;  Service: Cardiovascular;  Laterality: N/A;  . REFRACTIVE SURGERY Bilateral 2014  . STUMP REVISION Right 12/11/2014   Procedure: Right Below Knee Amputation Revision;  Surgeon: Nadara Mustard, MD;  Location[facility] OR;  Service: Orthopedics;  Laterality: Right;  . TOE AMPUTATION Left ?2011    only 2 toes remaning.   . TUBAL LIGATION  1990's  . WRIST SURGERY Right 1980's   "pinched nerve" (04/21/2013)    OB History    No data available       Home Medications    Prior to Admission medications   Medication Sig Start Date End Date Taking? Authorizing Provider  acyclovir ointment (ZOVIRAX) 5 % Apply topically every  3 (three) hours. Patient not taking: Reported on 07/07/2016 12/07/15   Leroy Sea, MD  amLODipine (NORVASC) 10 MG tablet Take 1 tablet (10 mg total) by mouth daily. 12/08/15   Leroy Sea, MD  aspirin EC 325 MG EC tablet Take 1 tablet (325 mg total) by mouth daily. 07/07/16   Zannie Cove, MD  aspirin EC 81 MG tablet Take 81 mg by mouth daily.    Historical Provider, MD  carvedilol (COREG) 3.125 MG tablet Take 1 tablet (3.125 mg total) by mouth 2 (two) times daily with a meal. 10/25/15   Rolly Salter, MD  FLUoxetine (PROZAC) 20 MG tablet Take 60 mg by mouth daily. 11/24/15   Historical Provider, MD  furosemide (LASIX) 40 MG tablet Take 0.5 tablets (20 mg total) by mouth daily. 10/25/15   Rolly Salter, MD  insulin aspart (NOVOLOG FLEXPEN) 100 UNIT/ML FlexPen Inject 0-50 Units into the skin 3 (three) times daily with meals. Per sliding  scale    Historical Provider, MD  insulin detemir (LEVEMIR) 100 UNIT/ML injection Inject 0.55 mLs (55 Units total) into the skin 2 (two) times daily. Patient taking differently: Inject 45 Units into the skin 2 (two) times daily.  12/07/15   Leroy Sea, MD  levothyroxine (SYNTHROID, LEVOTHROID) 25 MCG tablet Take 2 tablets (50 mcg total) by mouth every morning. 10/25/15   Rolly Salter, MD  lisinopril (PRINIVIL,ZESTRIL) 5 MG tablet Take 5 mg by mouth daily.    Historical Provider, MD  metoCLOPramide (REGLAN) 10 MG tablet Take 10 mg by mouth 4 (four) times daily. Take 1 tablet (10 mg) by mouth 30 minutes before meals and at bedtime 11/25/15   Historical Provider, MD  omeprazole (PRILOSEC) 40 MG capsule Take 40 mg by mouth at bedtime. 11/22/15   Historical Provider, MD  oxybutynin (DITROPAN-XL) 5 MG 24 hr tablet Take 10 mg by mouth daily.  11/22/15   Historical Provider, MD  rosuvastatin (CRESTOR) 20 MG tablet Take 40 mg by mouth daily.  11/22/15   Historical Provider, MD    Family History Family History  Problem Relation Age of Onset  . Hypertension Mother   . Hyperlipidemia Mother   . Hypertrophic cardiomyopathy Mother     has pacer  . Alzheimer's disease Father   . Hypertension Brother   . Hyperlipidemia Brother   . Cancer Maternal Aunt   . Diabetes Paternal Aunt   . Diabetes Paternal Uncle   . Diabetes Paternal Grandmother   . Anesthesia problems Neg Hx   . Hypotension Neg Hx   . Malignant hyperthermia Neg Hx   . Pseudochol deficiency Neg Hx     Social History Social History  Substance Use Topics  . Smoking status: Current Every Day Smoker    Packs/day: 0.50    Years: 33.00    Types: Cigarettes  . Smokeless tobacco: Never Used  . Alcohol use No     Allergies   Erythromycin; Iodine-131; Gadolinium; Ivp dye [iodinated diagnostic agents]; Metformin; and Penicillins   Review of Systems Review of Systems  Constitutional: Negative for chills and fever.  HENT: Negative for ear  pain and sore throat.   Eyes: Negative for pain and visual disturbance.  Respiratory: Negative for cough and shortness of breath.   Cardiovascular: Negative for chest pain and palpitations.  Gastrointestinal: Positive for nausea. Negative for abdominal pain, diarrhea and vomiting.  Genitourinary: Negative for dysuria and hematuria.  Musculoskeletal: Negative for arthralgias and back pain.  Skin: Negative for  color change and rash.  Neurological: Positive for numbness. Negative for dizziness, syncope, weakness, light-headedness and headaches.  All other systems reviewed and are negative.    Physical Exam Updated Vital Signs BP 129/65   Pulse 72   Resp 17   Ht 5\' 5"  (1.651 m)   Wt 97.5 kg   SpO2 98%   BMI 35.78 kg/m   Physical Exam  Constitutional: She is oriented to person, place, and time. She appears well-developed and well-nourished. No distress.  Patient sitting in bed in NAD.  HENT:  Head: Normocephalic and atraumatic.  Nose: Nose normal.  Mouth/Throat: Uvula is midline, oropharynx is clear and moist and mucous membranes are normal.  Eyes: Conjunctivae and EOM are normal. Pupils are equal, round, and reactive to light. Right eye exhibits no discharge. Left eye exhibits no discharge. No scleral icterus.  Neck: Normal range of motion. Neck supple. No thyromegaly present.  Cardiovascular: Normal rate, regular rhythm, normal heart sounds and intact distal pulses.  Exam reveals no gallop and no friction rub.   No murmur heard. Pulmonary/Chest: Effort normal and breath sounds normal. No respiratory distress. She has no decreased breath sounds. She has no wheezes.  Abdominal: Soft. Bowel sounds are normal. She exhibits no distension. There is no tenderness. There is no rebound and no guarding.  Musculoskeletal: Normal range of motion.  Moving all 4 extremities without any difficulties.  Lymphadenopathy:    She has no cervical adenopathy.  Neurological: She is alert and oriented  to person, place, and time.  The patient is alert, attentive, and oriented x 3. Speech is clear. Cranial nerve II-VII grossly intact however patient reports numbness to the left side of her face with testing of CN V. Negative pronator drift. Sensation intact. Strength 5/5 in all extremities. Reflexes 2+ and symmetric at biceps, triceps, knees, and ankles. Rapid alternating movement and fine finger movements intact. Patient with ataxia on ambulating that favors the left side.   Skin: Skin is warm and dry. Capillary refill takes less than 2 seconds.  Nursing note and vitals reviewed.    ED Treatments / Results  Labs (all labs ordered are listed, but only abnormal results are displayed) Labs Reviewed  COMPREHENSIVE METABOLIC PANEL - Abnormal; Notable for the following:       Result Value   Sodium 133 (*)    Chloride 97 (*)    Glucose, Bld 457 (*)    BUN 31 (*)    Creatinine, Ser 1.38 (*)    Albumin 2.9 (*)    Total Bilirubin 0.2 (*)    GFR calc non Af Amer 42 (*)    GFR calc Af Amer 49 (*)    All other components within normal limits  URINALYSIS, ROUTINE W REFLEX MICROSCOPIC (NOT AT [facility]) - Abnormal; Notable for the following:    Glucose, UA 500 (*)    Protein, ur 100 (*)    All other components within normal limits  URINE MICROSCOPIC-ADD ON - Abnormal; Notable for the following:    Squamous Epithelial / LPF 0-5 (*)    Bacteria, UA MANY (*)    Casts HYALINE CASTS (*)    All other components within normal limits  CBG MONITORING, ED - Abnormal; Notable for the following:    Glucose-Capillary 463 (*)    All other components within normal limits  I-STAT VENOUS BLOOD GAS, ED - Abnormal; Notable for the following:    pCO2, Ven 42.5 (*)    All other components within normal  limits  CBG MONITORING, ED - Abnormal; Notable for the following:    Glucose-Capillary 370 (*)    All other components within normal limits  CBC WITH DIFFERENTIAL/PLATELET  BLOOD GAS, VENOUS    EKG  EKG  Interpretation None       Radiology No results found.  Procedures Procedures (including critical care time)  Medications Ordered in ED Medications - No data to display   Initial Impression / Assessment and Plan / ED Course  I have reviewed the triage vital signs and the nursing notes.  Pertinent labs & imaging results that were available during my care of the patient were reviewed by me and considered in my medical decision making (see chart for details).  Clinical Course   Patient presents to the ED with left facial numbness and ataxia along with elevated blood sugar. Patient has had history of cervical symptoms of TIA in the past. She was admitted at the beginning of November for possible TIA. At that time she had carotid ultrasound that showed no significant stenosis. She had a recent CTA of head and neck that was unremarkable. Plan was for patient to follow-up with neurology. Patient denies any weakness. On my exam she is ataxic and has left facial numbness. No other focal neuro deficits. Blood sugar was noted to be 500. She was given 10 units of regular insulin and improved her blood sugar  To 329. She is getting fluid bolus currently. He ordered a second 10 units of regular insulin. Patient continues to have ataxia and complains of left-sided facial numbness. I have ordered a head CT that is pending. If the head CT is unremarkable and the patient's blood sugar has improved and she continues complain of ataxia and left facial numbness will consult neurology for further recommendations. Patient is hemodynamically stable in no acute distress this time. Her pH and bicarbonate are normal. Low suspicion for DKA at this time. UA is pending. Patient will be signed out to Dr. Myrtis Ser pending head CT results and neuro consult.Likely discharge home after neurology consult and the patient's symptoms improved. Patient will need follow-up with her nephrologist and cardiologist next week.  Final .  Clinical Impressions(s) / ED Diagnoses   Final diagnoses:  None    New Prescriptions New Prescriptions   No medications on file     Rise Mu, PA-C 07/21/16 1642    Jerelyn Scott, MD 07/28/16 1510

## 2016-07-21 NOTE — ED Notes (Signed)
Patient transported to CT 

## 2016-07-21 NOTE — Progress Notes (Signed)
New Admission Note:  Arrival Method: stretcher from ER Mental Orientation: alert & O x4 Telemetry: box1; pacemaker present; pt is A paced Assessment: Completed Skin: pt has a wound on left foot; prior to admission. IV: left forearm  Pain: 0/10 Tubes:n/a Safety Measures: Safety Fall Prevention Plan was given, discussed. Admission: Completed 62M: Patient has been orientated to the room, unit and the staff. Family: none present  Orders have been reviewed and implemented. Will continue to monitor the patient. Call light has been placed within reach and bed alarm has been activated.   Lawernce Ion ,RN

## 2016-07-21 NOTE — ED Notes (Signed)
Pt brought in by EMS. Pt c/o facial numbness, weakness, and elevated blood glucose. Pt has a 20G to the left forearm saline locked. Pt also complaining of dizziness and weakness with difficulty with ambulation. Pt one assist to bedside commode. Denies pain. Blood glucose now 258 after 10 units subq. Pt on room air and vitals stable. Pt also has right BKA.

## 2016-07-21 NOTE — ED Provider Notes (Signed)
  Physical Exam  BP 148/83   Pulse 72   Resp 16   Ht 5\' 5"  (1.651 m)   Wt 97.5 kg   SpO2 99%   BMI 35.78 kg/m   Physical Exam  ED Course  Procedures  MDM This patient is a 55 year old female who's had multiple visits for intermittent strokelike symptoms. Today having left-sided face numbness and ataxia. Previous symptoms have been right-sided weakness. She was seen by neurology and previous admission had a negative CTA and a negative carotid ultrasound. She cannot get an MRI because of pacemaker she has placed. No recent illness though she is hyperglycemic to the 300s. Without signs of infection. Awaiting a CT head. Will touch base with neurology for follow-up versus inpatient treatment plan.  CT head is negative for any acute intracranial abnormality. Temp to gamma this patient she still has an ataxic gait unable to ambulate independently. She will come in for inpatient PT evaluation further management by neurology and hospitalist team. Vital signs stable time of admission. Further made of her care please see inpatient team notes.       57, MD 07/21/16 2345    14/01/17, MD 07/24/16 [phone]

## 2016-07-21 NOTE — H&P (Signed)
History and Physical    [name] ZOX:[id number] DOB: August 16, 1961 DOA: 07/21/2016  Referring [name]/NP/PA:   PCP: [name], [name], [name]   Patient coming from:  The patient is coming from home.  At baseline, pt is independent for most of ADL.   Chief Complaint: Left facial numbness, leg weakness, ataxia and vision change  HPI: [name] is a 55 y.o. female with medical history significant of TIA, hypertension, hyperlipidemia, diabetes mellitus, GERD, hypothyroidism, depression, anxiety, rheumatoid arthritis, PVD, OSA, dCHF, pacemaker placement, CKD-III, tobacco abuse, R BAK, who presents with left facial numbness, leg weakness, ataxia and vision change.  Patient was recently hospitalized from 11/14-11/16 due to TIA. She was seen by neurology in previous admission and had negative CTA of head and neck, negative carotid ultrasound. 2d echo showed EF was 65-70 percent. She cannot get an MRI because of pacemaker. The patient comes back with, L4 bilateral leg weakness, left facial numbness and ataxia. Some vision change, described as "eye crossing". She continues to smoke. Patient denies chest pain, shortness of breath, fever or chills. She has nausea, but no vomiting, and abdominal pain, diarrhea, symptoms of UTI.  ED Course: pt was found to have negative CT head of acute intracranial abnormalities, negative urinalysis, WBC 6.9, stable renal function, O2 saturation 95% on room. Patient is placed on telemetry bed for observation. EDP consulted neurology, Dr. Shepard Generalster-->nothing to offer since pt can not do MRI and recommended PT/OT.  Review of Systems:   General: no fevers, chills, no changes in body weight, has poor appetite, has fatigue HEENT: no blurry vision, hearing changes or sore throat Respiratory: no dyspnea, coughing, wheezing CV: no chest pain, no palpitations GI: has nausea, no vomiting, abdominal pain, diarrhea, constipation GU: no dysuria, burning on urination, increased urinary  frequency, hematuria  Ext: no leg edema. R BKA Neuro: left facial numbness, leg weakness, ataxia and vision change. Skin: no rash, no skin tear. MSK: No muscle spasm, no deformity, no limitation of range of movement in spin Heme: No easy bruising.  Travel history: No recent long distant travel.  Allergy:  Allergies  Allergen Reactions  . Erythromycin Diarrhea  . Iodine-131 Nausea And Vomiting  . Gadolinium Nausea And Vomiting     Code: VOM, Desc: Pt began vomiting immed post infusion of multihance, Onset Date: [id number]   . Ivp Dye [Iodinated Diagnostic Agents] Nausea And Vomiting       . Metformin Diarrhea  . Penicillins Hives    Has patient had a PCN reaction causing immediate rash, facial/tongue/throat swelling, SOB or lightheadedness with hypotension: No Has patient had a PCN reaction causing severe rash involving mucus membranes or skin necrosis: No Has patient had a PCN reaction that required hospitalization No Has patient had a PCN reaction occurring within the last 10 years: No If all of the above answers are "NO", then may proceed with Cephalosporin use.     Past Medical History:  Diagnosis Date  . Anxiety   . Cellulitis   . CHF (congestive heart failure) (HCC)   . Chronic bronchitis (HCC)   . Complication of anesthesia 01/2013   "didn't know where I was; who I was; talking out the top of my head" (04/21/2013)  . Concussion as a teenager   slight  . COPD (chronic obstructive pulmonary disease) (HCC)    wear oxygen at home  . Daily headache   . Depression    takes CYmbalta daily  . Diabetic neuropathy (HCC)   . Diarrhea  pt takes Reglan tid  . GERD (gastroesophageal reflux disease)   . Hyperlipidemia   . Hypertension   . Hypothyroidism    takes Synthroid daily  . Kidney stone    "passed on before" (2013-05-13)  . MRSA (methicillin resistant staph aureus) culture positive    hx of 2012  . Neuromuscular disorder (HCC)   . Non-healing wound of lower extremity     lt foot  . Orthopnea    "just one; yesterday morning" (2013-05-13)  . OSA on CPAP    and oxygen  . Peripheral vascular disease (HCC)   . Pneumonia 1980's   "hospitalized w/it" (05/13/13)  . Rheumatoid arthritis (HCC)    "both knees" (May 13, 2013)  . Staph infection 2007  . Symptomatic bradycardia 03/04/2014  . Thyroid disease   . Type II diabetes mellitus (HCC)    Novolog,Levemir,and Lovaza daily    Past Surgical History:  Procedure Laterality Date  . AMPUTATION    . BLADDER SURGERY  1970's   "stretched the mouth of my bladder" (May 13, 2013)  . DILATION AND CURETTAGE OF UTERUS  1980's  . FOOT SURGERY Right 2012   "put pins in one year; amputated toes another OR" (May 13, 2013)  . GANGLION CYST EXCISION Left 1990's   "wrist" (2013/05/13)  . I&D EXTREMITY Left 08/25/2013   Procedure: IRRIGATION AND DEBRIDEMENT EXTREMITY;  Surgeon: Axel Filler, [name];  Location: [facility] OR;  Service: General;  Laterality: Left;  . INCISION AND DRAINAGE ABSCESS N/A 01/24/2013   Procedure: INCISION AND DRAINAGE ABSCESS;  Surgeon: Clovis Pu. Cornett, [name];  Location: [facility] OR;  Service: General;  Laterality: N/A;  . INGUINAL HERNIA REPAIR N/A 01/28/2013   Procedure: I&D Right Groin Wound;  Surgeon: Shelly Rubenstein, [name];  Location: [facility] OR;  Service: General;  Laterality: N/A;  . LEG AMPUTATION BELOW KNEE Right 2012  . PACEMAKER INSERTION  03/05/14   Biotronik dual chamber pacemaker implanted by Dr Graciela Husbands for symptomatic bradycardia  . PERMANENT PACEMAKER INSERTION N/A 03/05/2014   Procedure: PERMANENT PACEMAKER INSERTION;  Surgeon: Duke Salvia, [name];  Location: [facility] CATH LAB;  Service: Cardiovascular;  Laterality: N/A;  . REFRACTIVE SURGERY Bilateral 2014  . STUMP REVISION Right 12/11/2014   Procedure: Right Below Knee Amputation Revision;  Surgeon: Nadara Mustard, [name];  Location: [facility] OR;  Service: Orthopedics;  Laterality: Right;  . TOE AMPUTATION Left ?2011    only 2 toes remaning.   . TUBAL LIGATION  1990's  . WRIST SURGERY  Right 1980's   "pinched nerve" (05/13/2013)    Social History:  reports that she has been smoking Cigarettes.  She has a 16.50 pack-year smoking history. She has never used smokeless tobacco. She reports that she does not drink alcohol or use drugs.  Family History:  Family History  Problem Relation Age of Onset  . Hypertension Mother   . Hyperlipidemia Mother   . Hypertrophic cardiomyopathy Mother     has pacer  . Alzheimer's disease Father   . Hypertension Brother   . Hyperlipidemia Brother   . Cancer Maternal Aunt   . Diabetes Paternal Aunt   . Diabetes Paternal Uncle   . Diabetes Paternal Grandmother   . Anesthesia problems Neg Hx   . Hypotension Neg Hx   . Malignant hyperthermia Neg Hx   . Pseudochol deficiency Neg Hx      Prior to Admission medications   Medication Sig Start Date End Date Taking? Authorizing Provider  acyclovir ointment (ZOVIRAX) 5 % Apply topically every 3 (three) hours.  12/07/15  Yes Leroy Sea, [name]  amLODipine (NORVASC) 10 MG tablet Take 1 tablet (10 mg total) by mouth daily. 12/08/15  Yes Leroy Sea, [name]  aspirin EC 325 MG EC tablet Take 1 tablet (325 mg total) by mouth daily. 07/07/16  Yes Zannie Cove, [name]  aspirin EC 81 MG tablet Take 81 mg by mouth daily.   Yes Historical Provider, [name]  carvedilol (COREG) 3.125 MG tablet Take 1 tablet (3.125 mg total) by mouth 2 (two) times daily with a meal. 10/25/15  Yes Rolly Salter, [name]  FLUoxetine (PROZAC) 20 MG tablet Take 60 mg by mouth daily. 11/24/15  Yes Historical Provider, [name]  furosemide (LASIX) 40 MG tablet Take 0.5 tablets (20 mg total) by mouth daily. 10/25/15  Yes Rolly Salter, [name]  insulin aspart (NOVOLOG FLEXPEN) 100 UNIT/ML FlexPen Inject 0-50 Units into the skin 3 (three) times daily with meals. Per sliding scale   Yes Historical Provider, [name]  insulin detemir (LEVEMIR) 100 UNIT/ML injection Inject 0.55 mLs (55 Units total) into the skin 2 (two) times daily. Patient taking differently: Inject 45  Units into the skin 2 (two) times daily.  12/07/15  Yes Leroy Sea, [name]  levothyroxine (SYNTHROID, LEVOTHROID) 25 MCG tablet Take 2 tablets (50 mcg total) by mouth every morning. 10/25/15  Yes Rolly Salter, [name]  metoCLOPramide (REGLAN) 10 MG tablet Take 10 mg by mouth 4 (four) times daily. Take 1 tablet (10 mg) by mouth 30 minutes before meals and at bedtime 11/25/15  Yes Historical Provider, [name]  omeprazole (PRILOSEC) 40 MG capsule Take 40 mg by mouth at bedtime. 11/22/15  Yes Historical Provider, [name]  oxybutynin (DITROPAN-XL) 5 MG 24 hr tablet Take 10 mg by mouth daily.  11/22/15  Yes Historical Provider, [name]  rosuvastatin (CRESTOR) 20 MG tablet Take 40 mg by mouth daily.  11/22/15  Yes Historical Provider, [name]  lisinopril (PRINIVIL,ZESTRIL) 5 MG tablet Take 5 mg by mouth daily.    Historical Provider, [name]    Physical Exam: Vitals:   07/22/16 0038 07/22/16 0230 07/22/16 0422 07/22/16 0614  BP: (!) 119/51 139/64 (!) 107/56 135/62  Pulse: 69 67 68 72  Resp: 16 16    Temp: 98 F (36.7 C) 97.8 F (36.6 C) 97.5 F (36.4 C) 97.7 F (36.5 C)  TempSrc: Oral Oral Axillary   SpO2: 92% 97% 97% 99%  Weight:      Height:       General: Not in acute distress HEENT:       Eyes: PERRL, EOMI, no scleral icterus.       ENT: No discharge from the ears and nose, no pharynx injection, no tonsillar enlargement.        Neck: No JVD, no bruit, no mass felt. Heme: No neck lymph node enlargement. Cardiac: S1/S2, RRR, No murmurs, No gallops or rubs. Respiratory: No rales, wheezing, rhonchi or rubs. GI: Soft, nondistended, nontender, no rebound pain, no organomegaly, BS present. GU: No hematuria Ext: No pitting leg edema bilaterally. 2+DP/PT pulse bilaterally. Musculoskeletal: No joint deformities, No joint redness or warmth, no limitation of ROM in spin. Skin: No rashes.  Neuro: Alert, oriented X3, cranial nerves II-XII grossly intact, moves all extremities normally. Muscle strength 4/5 legs (s/p of R BAK) and  5/5 in arms, sensation to light touch intact. Brachial reflex 2+ bilaterally. Negative Babinski's sign. Normal finger to nose test. Psych: Patient is not psychotic, no suicidal or hemocidal ideation.  Labs on Admission: I have  personally reviewed following labs and imaging studies  CBC:  Recent Labs Lab 07/21/16 1340  WBC 6.9  NEUTROABS 4.1  HGB 12.9  HCT 38.3  MCV 83.6  PLT 192   Basic Metabolic Panel:  Recent Labs Lab 07/21/16 1340  NA 133*  K 5.1  CL 97*  CO2 25  GLUCOSE 457*  BUN 31*  CREATININE 1.38*  CALCIUM 9.3   GFR: Estimated Creatinine Clearance: 54.7 mL/min (by C-G formula based on SCr of 1.38 mg/dL (H)). Liver Function Tests:  Recent Labs Lab 07/21/16 1340  AST 22  ALT 19  ALKPHOS 99  BILITOT 0.2*  PROT 6.7  ALBUMIN 2.9*   No results for input(s): LIPASE, AMYLASE in the last 168 hours. No results for input(s): AMMONIA in the last 168 hours. Coagulation Profile: No results for input(s): INR, PROTIME in the last 168 hours. Cardiac Enzymes: No results for input(s): CKTOTAL, CKMB, CKMBINDEX, TROPONINI in the last 168 hours. BNP (last 3 results) No results for input(s): PROBNP in the last 8760 hours. HbA1C: No results for input(s): HGBA1C in the last 72 hours. CBG:  Recent Labs Lab 07/21/16 1555 07/21/16 1813 07/21/16 2140 07/22/16 0354 07/22/16 0654  GLUCAP 370* 258* 159* 316* 276*   Lipid Profile: No results for input(s): CHOL, HDL, LDLCALC, TRIG, CHOLHDL, LDLDIRECT in the last 72 hours. Thyroid Function Tests: No results for input(s): TSH, T4TOTAL, FREET4, T3FREE, THYROIDAB in the last 72 hours. Anemia Panel: No results for input(s): VITAMINB12, FOLATE, FERRITIN, TIBC, IRON, RETICCTPCT in the last 72 hours. Urine analysis:    Component Value Date/Time   COLORURINE YELLOW 07/21/2016 1605   APPEARANCEUR CLEAR 07/21/2016 1605   LABSPEC 1.014 07/21/2016 1605   PHURINE 6.5 07/21/2016 1605   GLUCOSEU 500 (A) 07/21/2016 1605   HGBUR  NEGATIVE 07/21/2016 1605   HGBUR negative 03/25/2010 1147   BILIRUBINUR NEGATIVE 07/21/2016 1605   KETONESUR NEGATIVE 07/21/2016 1605   PROTEINUR 100 (A) 07/21/2016 1605   UROBILINOGEN 0.2 12/07/2014 2125   NITRITE NEGATIVE 07/21/2016 1605   LEUKOCYTESUR NEGATIVE 07/21/2016 1605   Sepsis Labs: @LABRCNTIP (procalcitonin:4,lacticidven:4) )No results found for this or any previous visit (from the past 240 hour(s)).   Radiological Exams on Admission: Ct Head Wo Contrast  Result Date: 07/21/2016 CLINICAL DATA:  Left facial numbness and ataxia beginning this morning. EXAM: CT HEAD WITHOUT CONTRAST TECHNIQUE: Contiguous axial images were obtained from the base of the skull through the vertex without intravenous contrast. COMPARISON:  07/07/2016 FINDINGS: Brain: No evidence of acute infarction, hemorrhage, hydrocephalus, extra-axial collection or mass lesion/mass effect. Mild chronic small vessel disease again noted as well as old lacunar infarct involving the right caudate nucleus. Vascular: No hyperdense vessel or unexpected calcification. Skull: Normal. Negative for fracture or focal lesion. Sinuses/Orbits: No acute finding. Other: None. IMPRESSION: No acute intracranial abnormality. 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 Electronically Signed   By: 07/09/2016 M.D.   On: 07/21/2016 18:15     EKG: Not done in ED, will get one.   Assessment/Plan Principal Problem:   TIA (transient ischemic attack) Active Problems:   Diabetes mellitus type 2, uncontrolled, with complications (HCC)   TOBACCO ABUSE   Depression   HYPERCHOLESTEROLEMIA   HTN (hypertension)   Hypothyroidism   Chronic diastolic heart failure (HCC)   Hyperglycemia   GERD (gastroesophageal reflux disease)   TIA (transient ischemic attack): Patient's symptoms are concerning for recurrent TIA. CT head is negative for acute intracranial abnormalities. Neurology was consulted, per  Dr. Roxy Manns  recommended PT/OT. Patient cannot do MRI. Since patient had extensive negative workup in previous admission, will not repeat. Patient is already on aspirin, Crestor. Her triglyceride was elevated at 1090 on 07/05/16, which he needs to be controlled.  -will admit to tele bed for obs -Start fenofibrate -pt/ot -did extensive counseling about importance of quitting smoking  DM-II: Last A1c 12.6 on 07/05/16, poorly controled. Patient is taking Lantus at home -will decrease Lantus dose from 45 to 30 units daily  -SSI  Tobacco abuse: -Did counseling about importance of quitting smoking -Nicotine patch  HLD: Last LDL was not able to be calculated on previous tests. She had TG 1090 on 07/05/18 -Continue home medications: Crestor -start fenofibrate Hypothyroidism: Last TSH was 5.975 ounces/2/17 -Continue home Synthroid -Check TSH  GERD: -Protonix  Chronic diastolic heart failure (HCC): 2-D echo 07/06/16 showed EF of 65-70 percent. Patient does not have leg edema or JVD. CHF is compensated on admission. -Continue Coreg, Lasix 20 mg daily, aspirin -Check BMP   DVT ppx: SQ Lovenox Code Status: Full code Family Communication: None at bed side. Disposition Plan:  Anticipate discharge back to previous home environment Consults called:  neurology, Dr. Roxy Manns Admission status: Obs / tele   Date of Service 07/22/2016    Lorretta Harp [facility] Pager [phone]  If 7PM-7AM, please contact night-coverage [email or web address] Password [facility] 07/22/2016, 6:56 AM

## 2016-07-21 NOTE — ED Triage Notes (Signed)
Pt in from home via [facility] with c/o hyperglycemia, bilateral leg weakness, facial numbness that began yesterday at 0800. Pt's CBG 473 for EMS. Pt states she is nauseous, denies vomitting or dizziness. Hx of TIA, DM and pacemaker. A&Ox4

## 2016-07-22 DIAGNOSIS — G458 Other transient cerebral ischemic attacks and related syndromes: Secondary | ICD-10-CM | POA: Diagnosis not present

## 2016-07-22 DIAGNOSIS — I679 Cerebrovascular disease, unspecified: Secondary | ICD-10-CM

## 2016-07-22 DIAGNOSIS — R519 Headache, unspecified: Secondary | ICD-10-CM

## 2016-07-22 DIAGNOSIS — E7800 Pure hypercholesterolemia, unspecified: Secondary | ICD-10-CM

## 2016-07-22 DIAGNOSIS — I10 Essential (primary) hypertension: Secondary | ICD-10-CM | POA: Diagnosis not present

## 2016-07-22 DIAGNOSIS — E1142 Type 2 diabetes mellitus with diabetic polyneuropathy: Secondary | ICD-10-CM

## 2016-07-22 DIAGNOSIS — R51 Headache: Secondary | ICD-10-CM | POA: Diagnosis not present

## 2016-07-22 DIAGNOSIS — E118 Type 2 diabetes mellitus with unspecified complications: Secondary | ICD-10-CM | POA: Diagnosis not present

## 2016-07-22 DIAGNOSIS — R200 Anesthesia of skin: Secondary | ICD-10-CM

## 2016-07-22 LAB — GLUCOSE, CAPILLARY
GLUCOSE-CAPILLARY: 331 mg/dL — AB (ref 65–99)
GLUCOSE-CAPILLARY: 351 mg/dL — AB (ref 65–99)
GLUCOSE-CAPILLARY: 396 mg/dL — AB (ref 65–99)
Glucose-Capillary: 316 mg/dL — ABNORMAL HIGH (ref 65–99)
Glucose-Capillary: 276 mg/dL — ABNORMAL HIGH (ref 65–99)

## 2016-07-22 LAB — BRAIN NATRIURETIC PEPTIDE: B Natriuretic Peptide: 13.7 pg/mL (ref 0.0–100.0)

## 2016-07-22 LAB — TSH: TSH: 2.818 u[IU]/mL (ref 0.350–4.500)

## 2016-07-22 MED ORDER — MECLIZINE HCL 12.5 MG PO TABS
25.0000 mg | ORAL_TABLET | Freq: Three times a day (TID) | ORAL | Status: DC | PRN
  Administered 2016-07-23: 25 mg via ORAL
  Filled 2016-07-22: qty 2

## 2016-07-22 MED ORDER — METOCLOPRAMIDE HCL 5 MG/ML IJ SOLN
10.0000 mg | Freq: Three times a day (TID) | INTRAMUSCULAR | Status: DC
  Administered 2016-07-22 – 2016-07-23 (×5): 10 mg via INTRAVENOUS
  Filled 2016-07-22 (×5): qty 2

## 2016-07-22 MED ORDER — CLOPIDOGREL BISULFATE 75 MG PO TABS
75.0000 mg | ORAL_TABLET | Freq: Every day | ORAL | Status: DC
  Administered 2016-07-22 – 2016-07-23 (×2): 75 mg via ORAL
  Filled 2016-07-22 (×2): qty 1

## 2016-07-22 MED ORDER — METOCLOPRAMIDE HCL 5 MG/ML IJ SOLN: 10.0000 mg | Freq: Three times a day (TID) | INTRAMUSCULAR | Status: DC

## 2016-07-22 MED ORDER — FENOFIBRATE 160 MG PO TABS
160.0000 mg | ORAL_TABLET | Freq: Every day | ORAL | Status: DC
Start: 2016-07-22 — End: 2016-07-23
  Administered 2016-07-22 – 2016-07-23 (×2): 160 mg via ORAL
  Filled 2016-07-22 (×2): qty 1

## 2016-07-22 MED ORDER — INSULIN DETEMIR 100 UNIT/ML ~~LOC~~ SOLN
40.0000 [IU] | Freq: Two times a day (BID) | SUBCUTANEOUS | Status: DC
  Administered 2016-07-22 – 2016-07-23 (×2): 40 [IU] via SUBCUTANEOUS
  Filled 2016-07-22 (×3): qty 0.4

## 2016-07-22 MED ORDER — INSULIN ASPART 100 UNIT/ML ~~LOC~~ SOLN
6.0000 [IU] | Freq: Three times a day (TID) | SUBCUTANEOUS | Status: DC
  Administered 2016-07-22 – 2016-07-23 (×3): 6 [IU] via SUBCUTANEOUS

## 2016-07-22 NOTE — Progress Notes (Signed)
PT Cancellation Note  Patient Details Name: [name] MRN: [id number] DOB: 23-Apr-1961   Cancelled Treatment:    Reason Eval/Treat Not Completed: Other (comment) (Pt with active bedrest order) MD-please increase activity order if appropriate and PT will re-attempt evaluation later today. Thanks! Donnella Sham 07/22/2016, 7:27 AM Lavona Mound, PT  [phone] 07/22/2016

## 2016-07-22 NOTE — Evaluation (Signed)
Physical Therapy Evaluation Patient Details Name: [name] MRN: [id number] DOB: 1960/11/19 Today's Date: 07/22/2016   History of Present Illness  55 yo female admitted with facial numbness. CT negative and has pacemaker so can't have MRI. PMH significant for R BKA, DM, COPD, anxiety, chronic wound on LLE, PVD  Clinical Impression  Pt came to hospital with above complaint. Pt currently with functional limitations due to the deficits listed below (see PT Problem List).  Pt will benefit from skilled PT to increase their independence and safety with mobility to allow discharge to home with intermittent assistance.         Follow Up Recommendations Home health PT;Supervision - Intermittent (was receiving HHPT PTA per pt)    Equipment Recommendations  None recommended by PT    Recommendations for Other Services       Precautions / Restrictions Precautions Precautions: Fall Required Braces or Orthoses: Other Brace/Splint (has RLE prosthetic)      Mobility  Bed Mobility Overal bed mobility: Modified Independent                Transfers Overall transfer level: Needs assistance Equipment used: Rolling walker (2 wheeled) Transfers: Sit to/from Stand Sit to Stand: Min guard         General transfer comment: pt used RW to go sit to stand  Ambulation/Gait Ambulation/Gait assistance: Min guard Ambulation Distance (Feet): 2 Feet Assistive device: Rolling walker (2 wheeled)       General Gait Details: Gait very limited due to c/o nausea and swimmyheaded. Pt states it was slightly worse with mobility but has been fairly constant. Pt did vomit about 5 minutes after PT left room.  Stairs            Wheelchair Mobility    Modified Rankin (Stroke Patients Only) Modified Rankin (Stroke Patients Only) Pre-Morbid Rankin Score: Moderate disability Modified Rankin: Moderate disability     Balance Overall balance assessment: Needs assistance Sitting-balance  support: Feet supported Sitting balance-[name]: Normal Sitting balance - Comments: dond prosthesis sitting  on bed edge   Standing balance support:  (Pt used RW when standing, did not stand without RW.  )                                 Pertinent Vitals/Pain Pain Assessment: No/denies pain    Home Living Family/patient expects to be discharged to:: Private residence Living Arrangements: Alone Available Help at Discharge: Personal care attendant;Available PRN/intermittently Type of Home: Apartment Home Access: Stairs to enter Entrance Stairs-Rails: Right;Left Entrance Stairs-Number of Steps: 2 at sidewalk with  bilateral rails and one to get in Home Layout: One level Home Equipment: Grab bars - tub/shower;Bedside commode;Shower seat;Wheelchair - manual;Hand held Careers information officer - 2 wheels;Cane - single point Additional Comments: aide 7 days week 1-2 hours/ aide does shopping (Pt reports no change in home situation since last admit)    Prior Function Level of Independence: Independent with assistive device(s)   Gait / Transfers Assistance Needed: uses grab bars to ambulate into bathroom from w/c  ADL's / Homemaking Assistance Needed: aide now (A)  with dressing, bathing. has assist for household chores  Comments: uses w/c mainly, walks short distances with RW or cane,negotiates steps at entrance     Hand Dominance   Dominant Hand: Left    Extremity/Trunk Assessment   Upper Extremity Assessment: Defer to OT evaluation  Lower Extremity Assessment: RLE deficits/detail RLE Deficits / Details: R BKA (No focal weaknesses noted)       Communication   Communication: No difficulties  Cognition Arousal/Alertness: Awake/alert Behavior During Therapy: WFL for tasks assessed/performed Overall Cognitive Status: Within Functional Limits for tasks assessed                      General Comments General comments (skin integrity, edema,  etc.): Pt limited with mobility due to nausea and "swimmyheaded"    Exercises     Assessment/Plan    PT Assessment Patient needs continued PT services  PT Problem List Decreased activity tolerance;Decreased mobility          PT Treatment Interventions Gait training;DME instruction;Functional mobility training;Therapeutic activities;Therapeutic exercise;Patient/family education;Stair training    PT Goals (Current goals can be found in the Care Plan section)  Acute Rehab PT Goals Patient Stated Goal: to feel better PT Goal Formulation: With patient Time For Goal Achievement: 07/29/16 Potential to Achieve Goals: Good    Frequency Min 3X/week   Barriers to discharge        Co-evaluation               End of Session Equipment Utilized During Treatment: Gait belt;Other (comment) (R prosthetic) Activity Tolerance: Other (comment) (Pt nauseated throughout) Patient left: in chair;with chair alarm set;with call bell/phone within reach Nurse Communication: Mobility status;Other (comment) (pt nauseated)    Functional Assessment Tool Used: clinical judgement Functional Limitation: Mobility: Walking and moving around Mobility: Walking and Moving Around Current Status [phone]): At least 20 percent but less than 40 percent impaired, limited or restricted Mobility: Walking and Moving Around Goal Status [phone]): At least 1 percent but less than 20 percent impaired, limited or restricted    Time: 1030-1058 PT Time Calculation (min) (ACUTE ONLY): 28 min   Charges:   PT Evaluation $PT Eval Moderate Complexity: 1 Procedure PT Treatments $Gait Training: 8-22 mins   PT G Codes:   PT G-Codes **NOT FOR INPATIENT CLASS** Functional Assessment Tool Used: clinical judgement Functional Limitation: Mobility: Walking and moving around Mobility: Walking and Moving Around Current Status ([id number]): At least 20 percent but less than 40 percent impaired, limited or restricted Mobility: Walking  and Moving Around Goal Status [phone]): At least 1 percent but less than 20 percent impaired, limited or restricted    Donnella Sham 07/22/2016, 11:21 AM Lavona Mound, PT  [phone] 07/22/2016

## 2016-07-22 NOTE — Progress Notes (Addendum)
Occupational Therapy Evaluation Patient Details Name: [name] MRN: [id number] DOB: [birth year] Today's Date: 07/22/2016    History of Present Illness 55 yo female admitted with facial numbness. CT negative and has pacemaker so can't have MRI. PMH significant for R BKA, DM, COPD, anxiety, chronic wound on LLE, PVD   Clinical Impression   Evaluation limited by complaints of dizziness. Pt unable to complete transfer to chair due ot onset of dizziness and unable to sit EOB - pt leaning to L. If dizziness and mobility improve, pt will be appropriate for DC home with HHOT; however, if dizziness persists, pt may need short stay at [facility] for rehab.Pt lives alone and needs to be modified independent to safely DC home. Will follow acutely     Follow Up Recommendations  Home health OT;Supervision/Assistance - 24 hour vs CIR   Equipment Recommendations  None recommended by OT    Recommendations for Other Services       Precautions / Restrictions Precautions Precautions: Fall Required Braces or Orthoses: Other Brace/Splint (has RLE prosthetic)      Mobility Bed Mobility Overal bed mobility: Needs Assistance Bed Mobility: Supine to Sit;Sit to Supine     Supine to sit: Supervision Sit to supine: Supervision      Transfers Overall transfer level: Needs assistance Equipment used: Rolling walker (2 wheeled)   Sit to Stand: Min assist         General transfer comment: pt used RW to go sit to stand but unable to stand due to dizziness and leaning left.     Balance     Sitting balance-[name]: Normal Sitting balance - Comments: dond prosthesis sitting  on bed edge     Standing balance-[name]: Poor Standing balance comment: LOB to L                            ADL Overall ADL's : Needs assistance/impaired Eating/Feeding: Modified independent   Grooming: Set up;Sitting   Upper Body Bathing: Set up;Sitting   Lower Body Bathing: Minimal  assistance;Sit to/from stand   Upper Body Dressing : Set up;Sitting;Supervision/safety   Lower Body Dressing: Moderate assistance;Sit to/from Market researcher Details (indicate cue type and reason): unable to complete due to dizziness         Functional mobility during ADLs: Minimal assistance;Rolling walker General ADL Comments: Pt unable to complete transfer due to dizziness and leaning L.      Vision Vision Assessment?: Yes Additional Comments: Pt reports at times she sees double at disance adn that images are side by side.   Perception     Praxis      Pertinent Vitals/Pain Pain Assessment: No/denies pain     Hand Dominance Left   Extremity/Trunk Assessment Upper Extremity Assessment Upper Extremity Assessment: Generalized weakness   Lower Extremity Assessment Lower Extremity Assessment: Defer to PT evaluation RLE Deficits / Details: R BKA (No focal weaknesses noted)   Cervical / Trunk Assessment Cervical / Trunk Assessment: Kyphotic   Communication Communication Communication: No difficulties   Cognition Arousal/Alertness: Awake/alert Behavior During Therapy: WFL for tasks assessed/performed Overall Cognitive Status: Within Functional Limits for tasks assessed                     General Comments       Exercises       Shoulder Instructions      Home Living Family/patient expects to  be discharged to:: Private residence Living Arrangements: Alone Available Help at Discharge: Personal care attendant;Available PRN/intermittently Type of Home: Apartment Home Access: Stairs to enter Entrance Stairs-Number of Steps: 2 at sidewalk with  bilateral rails and one to get in Entrance Stairs-Rails: Right;Left Home Layout: One level     Bathroom Shower/Tub: Producer, television/film/video: Standard Bathroom Accessibility: No   Home Equipment: Grab bars - tub/shower;Bedside commode;Shower seat;Wheelchair - manual;Hand held Pensions consultant - 2 wheels;Cane - single point   Additional Comments: aide 7 days week 1-2 hours/ aide does shopping (Pt reports no change in home situation since last admit)  Lives With: Alone    Prior Functioning/Environment Level of Independence: Independent with assistive device(s)  Gait / Transfers Assistance Needed: uses grab bars to ambulate into bathroom from w/c ADL's / Homemaking Assistance Needed: aide now (A)  with dressing, bathing. has assist for household chores   Comments: uses w/c mainly, walks short distances with RW or cane,negotiates steps at entrance        OT Problem List: Decreased activity tolerance;Impaired balance (sitting and/or standing);Impaired vision/perception;Decreased safety awareness;Decreased knowledge of use of DME or AE;Obesity   OT Treatment/Interventions: Self-care/ADL training;Therapeutic exercise;DME and/or AE instruction;Therapeutic activities;Patient/family education;Balance training    OT Goals(Current goals can be found in the care plan section) Acute Rehab OT Goals Patient Stated Goal: to get better OT Goal Formulation: With patient Time For Goal Achievement: 07/29/16 Potential to Achieve Goals: Good  OT Frequency: Min 2X/week   Barriers to D/C: Decreased caregiver support          Co-evaluation              End of Session Equipment Utilized During Treatment: Gait belt;Rolling walker Nurse Communication: Mobility status  Activity Tolerance: Treatment limited secondary to medical complications (Comment) (onset of dizziness) Patient left: in bed;with call bell/phone within reach;with bed alarm set   Time: 4696-2952 OT Time Calculation (min): 13 min Charges:  OT General Charges $OT Visit: 1 Procedure OT Evaluation $OT Eval Moderate Complexity: 1 Procedure G-Codes: OT G-codes **NOT FOR INPATIENT CLASS** Functional Assessment Tool Used: clincial judgement Functional Limitation: Self care Self Care Current Status (W4132): At  least 20 percent but less than 40 percent impaired, limited or restricted Self Care Goal Status (G4010): At least 1 percent but less than 20 percent impaired, limited or restricted  [facility] 07/22/2016, 3:53 PM

## 2016-07-22 NOTE — Progress Notes (Signed)
PROGRESS NOTE        PATIENT DETAILS Name: [name] Age: 55 y.o. Sex: female Date of Birth: 1960/10/23 Admit Date: 07/21/2016 Admitting Physician Lorretta HarpXilin Niu, MD KVQ:[id number]:KALISH, Nolon BussingMICHAEL J, MD  Brief Narrative: Patient is a 55 y.o. female with recent diagnosis of TIA requiring hospitalization and further evaluation from 11/14-11/16, readmitted on 12/1 with left sided tingling and numbness. See below for further details  Subjective: Vomiting this morning. Her headaches this morning  Assessment/Plan: L facial numbness-?Secondary to CVA: Could be possible thalamic CVA per neurology-unable to do MRI to confirm as she has a pacemaker in place. Per neurology, this could be non-physiologic as well. Pacemaker interrogated, no episodes of atrial fibrillation. Will switch from aspirin to Plavix, continue statin. Await evaluation by physical therapy services.  Uncontrolled Type 2 diabetes with hyperglycemia: Recent A1c 12.6, CBGs still not adequately controlled-will increase Levemir to 40 units twice a day, add NovoLog 6 units with meals and follow CBGs  Hypertriglyceridemia: Recent lipid panel showed triglycerides more than 1000-increased fenofibrate to 160 mg. Continue statin. Repeat lipid panel tomorrow.  Hypertension: Untoward-continue amlodipine, lisinopril Lasix  Probable gastroparesis: On oral Reglan as outpatient-vomiting this morning-suspect she has asked to paresis flare-up abdominal exam is benign-her last bowel movement was just 2 days ago. Will change Reglan to IV, and follow clinical course.  History of symptomatic bradycardia due to sick sinus syndrome-status post PPM placement in 2015: Paced rhythm in telemetry, pacemaker interrogated today by cardiology-no A. Fib.  Chronic diastolic heart failure: Compensated, continue Lasix.  Chronic kidney disease stage III: Creatinine close to usual baseline, follow  Hypothyroidism: Continue with  Synthroid  Headaches: Not felt to be migraine at this time, supportive care only.  GERD: Continue PPI  Chronic left foot wound: Present prior to admission, have ordered wound care evaluation  Status post right BKA  Tobacco abuse: Counseled  DVT Prophylaxis: Prophylactic Lovenox   Code Status: Full code   Family Communication: None at bedside  Disposition Plan: Remain inpatient-home tomorrow if clinically improved  Antimicrobial agents: None  Procedures: None  CONSULTS:  neurology  Time spent: 25 minutes-Greater than 50% of this time was spent in counseling, explanation of diagnosis, planning of further management, and coordination of care.  MEDICATIONS: Anti-infectives    None      Scheduled Meds: .  stroke: mapping our early stages of recovery book   Does not apply Once  . acyclovir ointment   Topical Q3H  . amLODipine  10 mg Oral Daily  . aspirin  325 mg Oral Daily  . carvedilol  3.125 mg Oral BID WC  . enoxaparin (LOVENOX) injection  40 mg Subcutaneous Q24H  . fenofibrate  160 mg Oral Daily  . FLUoxetine  60 mg Oral Daily  . furosemide  20 mg Oral Daily  . insulin aspart  0-9 Units Subcutaneous TID WC  . insulin detemir  30 Units Subcutaneous BID  . levothyroxine  50 mcg Oral QAC breakfast  . lisinopril  5 mg Oral Daily  . metoCLOPramide  10 mg Oral QID  . nicotine  21 mg Transdermal Daily  . oxybutynin  10 mg Oral Daily  . pantoprazole  40 mg Oral Daily  . rosuvastatin  40 mg Oral Daily   Continuous Infusions: PRN Meds:.acetaminophen **OR** acetaminophen, ondansetron, senna-docusate, zolpidem   PHYSICAL EXAM: Vital signs: Vitals:  07/22/16 0230 07/22/16 0422 07/22/16 0614 07/22/16 1039  BP: 139/64 (!) 107/56 135/62 130/69  Pulse: 67 68 72 67  Resp: 16   18  Temp: 97.8 F (36.6 C) 97.5 F (36.4 C) 97.7 F (36.5 C) 97.7 F (36.5 C)  TempSrc: Oral Axillary  Oral  SpO2: 97% 97% 99% 98%  Weight:      Height:       Filed Weights    07/21/16 1338 07/21/16 2030  Weight: 97.5 kg (215 lb) 100.6 kg (221 lb 12.5 oz)   Body mass index is 36.91 kg/m.   General appearance :Awake, alert, not in any distress. Speech Clear. Not toxic Looking Eyes:, pupils equally reactive to light and accomodation,no scleral icterus.Pink conjunctiva HEENT: Atraumatic and Normocephalic Neck: supple, no JVD. No cervical lymphadenopathy.  Resp:Good air entry bilaterally, no added sounds  CVS: S1 S2 regular, no murmurs.  GI: Bowel sounds present, Non tender and not distended with no gaurding, rigidity or rebound.No organomegaly Extremities: Right BKA, chronic left foot wound-currently dressed (did not open)  Neurology:  Nonfocal exam Psychiatric: Normal judgment and insight. Alert and oriented x 3. Normal mood. Musculoskeletal:gait appears to be normal.No digital cyanosis Skin:No Rash, warm and dry Wounds:N/A  I have personally reviewed following labs and imaging studies  LABORATORY DATA: CBC:  Recent Labs Lab 07/21/16 1340  WBC 6.9  NEUTROABS 4.1  HGB 12.9  HCT 38.3  MCV 83.6  PLT 192    Basic Metabolic Panel:  Recent Labs Lab 07/21/16 1340  NA 133*  K 5.1  CL 97*  CO2 25  GLUCOSE 457*  BUN 31*  CREATININE 1.38*  CALCIUM 9.3    GFR: Estimated Creatinine Clearance: 54.7 mL/min (by C-G formula based on SCr of 1.38 mg/dL (H)).  Liver Function Tests:  Recent Labs Lab 07/21/16 1340  AST 22  ALT 19  ALKPHOS 99  BILITOT 0.2*  PROT 6.7  ALBUMIN 2.9*   No results for input(s): LIPASE, AMYLASE in the last 168 hours. No results for input(s): AMMONIA in the last 168 hours.  Coagulation Profile: No results for input(s): INR, PROTIME in the last 168 hours.  Cardiac Enzymes: No results for input(s): CKTOTAL, CKMB, CKMBINDEX, TROPONINI in the last 168 hours.  BNP (last 3 results) No results for input(s): PROBNP in the last 8760 hours.  HbA1C: No results for input(s): HGBA1C in the last 72 hours.  CBG:  Recent  Labs Lab 07/21/16 1555 07/21/16 1813 07/21/16 2140 07/22/16 0354 07/22/16 0654  GLUCAP 370* 258* 159* 316* 276*    Lipid Profile: No results for input(s): CHOL, HDL, LDLCALC, TRIG, CHOLHDL, LDLDIRECT in the last 72 hours.  Thyroid Function Tests:  Recent Labs  07/22/16 0533  TSH 2.818    Anemia Panel: No results for input(s): VITAMINB12, FOLATE, FERRITIN, TIBC, IRON, RETICCTPCT in the last 72 hours.  Urine analysis:    Component Value Date/Time   COLORURINE YELLOW 07/21/2016 1605   APPEARANCEUR CLEAR 07/21/2016 1605   LABSPEC 1.014 07/21/2016 1605   PHURINE 6.5 07/21/2016 1605   GLUCOSEU 500 (A) 07/21/2016 1605   HGBUR NEGATIVE 07/21/2016 1605   HGBUR negative 03/25/2010 1147   BILIRUBINUR NEGATIVE 07/21/2016 1605   KETONESUR NEGATIVE 07/21/2016 1605   PROTEINUR 100 (A) 07/21/2016 1605   UROBILINOGEN 0.2 12/07/2014 2125   NITRITE NEGATIVE 07/21/2016 1605   LEUKOCYTESUR NEGATIVE 07/21/2016 1605    Sepsis Labs: Lactic Acid, Venous    Component Value Date/Time   LATICACIDVEN 1.01 12/04/2015 2306  MICROBIOLOGY: No results found for this or any previous visit (from the past 240 hour(s)).  RADIOLOGY STUDIES/RESULTS: Ct Angio Head W Or Wo Contrast  Result Date: 07/07/2016 CLINICAL DATA:  55 y/o F; right arm and leg numbness with difficulty speaking. EXAM: CT ANGIOGRAPHY HEAD AND NECK TECHNIQUE: Multidetector CT imaging of the head and neck was performed using the standard protocol during bolus administration of intravenous contrast. Multiplanar CT image reconstructions and MIPs were obtained to evaluate the vascular anatomy. Carotid stenosis measurements (when applicable) are obtained utilizing NASCET criteria, using the distal internal carotid diameter as the denominator. CONTRAST:  50 cc Isovue 370 COMPARISON:  07/05/2016 CT head.  05/04/2011 MRA head. FINDINGS: CT HEAD FINDINGS Brain: No evidence of acute infarction, hemorrhage, hydrocephalus, extra-axial  collection or mass lesion/mass effect. Mild chronic microvascular ischemic changes and parenchymal volume loss. Vascular: See below. Skull: Normal. Negative for fracture or focal lesion. Sinuses: Imaged portions are clear. Orbits: No acute finding. Review of the MIP images confirms the above findings CTA NECK FINDINGS Aortic arch: Bovine arch, normal variant. Imaged portion shows no evidence of aneurysm or dissection. No significant stenosis of the major arch vessel origins. Right carotid system: No evidence of dissection, stenosis (50% or greater) or occlusion. Calcified plaque at the bifurcation with mild 30% proximal ICA stenosis. Left carotid system: No evidence of dissection, stenosis (50% or greater) or occlusion. Mixed plaque of the carotid bifurcation with minimal stenosis. Vertebral arteries: Codominant. No evidence of dissection, stenosis (50% or greater) or occlusion. Skeleton: Mild cervical spondylosis. 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 Multilevel bony foraminal narrowing. Other neck: Left paramedian posterior lower neck subcutaneous fat cyst measuring 14 mm, probably a dermal appendage cyst. Upper chest: Clear lungs. Review of the MIP images confirms the above findings CTA HEAD FINDINGS Anterior circulation: No significant stenosis, proximal occlusion, aneurysm, or vascular malformation. Posterior circulation: No significant stenosis, proximal occlusion, aneurysm, or vascular malformation. Venous sinuses: As permitted by contrast timing, patent. Anatomic variants: Hypoplastic right A1 and large anterior communicating artery, normal variant. Large left posterior communicating artery relatively small P1 segment compatible with persistent fetal circulation. Diminutive right posterior communicating artery. Delayed phase: No abnormal intracranial enhancement. Review of the MIP images confirms the above findings IMPRESSION: 1. No  acute intracranial abnormality or abnormal enhancement of the brain. 2. No evidence of dissection, hemodynamically significant stenosis, or occlusion of the carotid and vertebral arteries of the neck. 3. Bilateral carotid bifurcation atherosclerosis with mild approximately 30% stenosis. 4. Patent circle of Willis without evidence for significant stenosis, proximal occlusion, aneurysm, or vascular malformation. Electronically Signed   By: Mitzi Hansen M.D.   On: 07/07/2016 22:10   Ct Head Wo Contrast  Result Date: 07/21/2016 CLINICAL DATA:  Left facial numbness and ataxia beginning this morning. EXAM: CT HEAD WITHOUT CONTRAST TECHNIQUE: Contiguous axial images were obtained from the base of the skull through the vertex without intravenous contrast. COMPARISON:  07/07/2016 FINDINGS: Brain: No evidence of acute infarction, hemorrhage, hydrocephalus, extra-axial collection or mass lesion/mass effect. Mild chronic small vessel disease again noted as well as old lacunar infarct involving the right caudate nucleus. Vascular: No hyperdense vessel or unexpected calcification. Skull: Normal. Negative for fracture or focal lesion. Sinuses/Orbits: No acute finding. Other: None. IMPRESSION: No acute intracranial abnormality. 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 Electronically Signed   By: Myles Rosenthal M.D.   On: 07/21/2016 18:15   Ct Head Wo Contrast  Result Date: 07/05/2016 CLINICAL DATA:  TIAs with right-sided weakness and slurred speech. Gait disturbance P EXAM: CT HEAD WITHOUT CONTRAST TECHNIQUE: Contiguous axial images were obtained from the base of the skull through the vertex without intravenous contrast. COMPARISON:  Head CT 07/04/2016 and previous FINDINGS: Brain: No sign of acute infarction, mass lesion, hemorrhage, hydrocephalus or extra-axial collection. 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 Vascular: No  hyperdense vessels.  No vascular calcification. Skull: Normal Sinuses/Orbits: Clear/normal Other: None significant IMPRESSION: No acute finding by CT. 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 Electronically Signed   By: Paulina Fusi M.D.   On: 07/05/2016 13:09   Ct Head Wo Contrast  Result Date: 07/04/2016 CLINICAL DATA:  Four day history of right-sided arm and leg weakness. Slurred speech. EXAM: CT HEAD WITHOUT CONTRAST TECHNIQUE: Contiguous axial images were obtained from the base of the skull through the vertex without intravenous contrast. COMPARISON:  October 21, 2015 FINDINGS: Brain: Mild atrophy for age is stable. There is no intracranial mass, hemorrhage, extra-axial fluid collection, or midline shift. There is mild patchy small vessel disease in the centra semiovale bilaterally. Elsewhere gray-white compartments appear unremarkable and stable. No acute infarct is evident on this study. Vascular: There is no hyperdense vessel. There is mild calcification in the distal carotid siphon region on the left. Skull: Bony calvarium appears intact. Sinuses/Orbits: Visualized paranasal sinuses are clear. Orbits appear symmetric bilaterally Other: Mastoid air cells are clear. IMPRESSION: Mild atrophy with mild patchy periventricular small vessel disease, stable. No acute infarct evident. No mass, hemorrhage, or extra-axial fluid collection. There is mild calcification in the distal left carotid siphon region. Electronically Signed   By: Bretta Bang III M.D.   On: 07/04/2016 15:30   Ct Angio Neck W And/or Wo Contrast  Result Date: 07/07/2016 CLINICAL DATA:  55 y/o F; right arm and leg numbness with difficulty speaking. EXAM: CT ANGIOGRAPHY HEAD AND NECK TECHNIQUE: Multidetector CT imaging of the head and neck was performed using the standard protocol during bolus administration of intravenous contrast. Multiplanar CT  image reconstructions and MIPs were obtained to evaluate the vascular anatomy. Carotid stenosis measurements (when applicable) are obtained utilizing NASCET criteria, using the distal internal carotid diameter as the denominator. CONTRAST:  50 cc Isovue 370 COMPARISON:  07/05/2016 CT head.  05/04/2011 MRA head. FINDINGS: CT HEAD FINDINGS Brain: No evidence of acute infarction, hemorrhage, hydrocephalus, extra-axial collection or mass lesion/mass effect. Mild chronic microvascular ischemic changes and parenchymal volume loss. Vascular: See below. Skull: Normal. Negative for fracture or focal lesion. Sinuses: Imaged portions are clear. Orbits: No acute finding. Review of the MIP images confirms the above findings CTA NECK FINDINGS Aortic arch: Bovine arch, normal variant. Imaged portion shows no evidence of aneurysm or dissection. No significant stenosis of the major arch vessel origins. Right carotid system: No evidence of dissection, stenosis (50% or greater) or occlusion. Calcified plaque at the bifurcation with mild 30% proximal ICA stenosis. Left carotid system: No evidence of dissection, stenosis (50% or greater) or occlusion. Mixed plaque of the carotid bifurcation with minimal stenosis. Vertebral arteries: Codominant. No evidence of dissection, stenosis (50% or greater) or occlusion. Skeleton: Mild cervical spondylosis. 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 Multilevel bony foraminal narrowing. Other neck: Left paramedian posterior lower neck subcutaneous fat cyst measuring 14 mm, probably  a dermal appendage cyst. Upper chest: Clear lungs. Review of the MIP images confirms the above findings CTA HEAD FINDINGS Anterior circulation: No significant stenosis, proximal occlusion, aneurysm, or vascular malformation. Posterior circulation: No significant stenosis, proximal occlusion, aneurysm, or vascular malformation. Venous sinuses: As permitted  by contrast timing, patent. Anatomic variants: Hypoplastic right A1 and large anterior communicating artery, normal variant. Large left posterior communicating artery relatively small P1 segment compatible with persistent fetal circulation. Diminutive right posterior communicating artery. Delayed phase: No abnormal intracranial enhancement. Review of the MIP images confirms the above findings IMPRESSION: 1. No acute intracranial abnormality or abnormal enhancement of the brain. 2. No evidence of dissection, hemodynamically significant stenosis, or occlusion of the carotid and vertebral arteries of the neck. 3. Bilateral carotid bifurcation atherosclerosis with mild approximately 30% stenosis. 4. Patent circle of Willis without evidence for significant stenosis, proximal occlusion, aneurysm, or vascular malformation. Electronically Signed   By: Mitzi Hansen M.D.   On: 07/07/2016 22:10     LOS: 0 days   Jeoffrey Massed, MD  [facility] Pager:[phone]  If 7PM-7AM, please contact night-coverage [email or web address] Password [email or web address] 07/22/2016, 11:19 AM

## 2016-07-22 NOTE — Evaluation (Signed)
Speech Language Pathology Evaluation Patient Details Name: [name] MRN: [id number] DOB: Jan 18, 1961 Today's Date: 07/22/2016 Time: 2263-3354 SLP Time Calculation (min) (ACUTE ONLY): 17 min  Problem List:  Patient Active Problem List   Diagnosis Date Noted  . Left facial numbness   . Cerebrovascular disease   . Diabetic polyneuropathy associated with type 2 diabetes mellitus (HCC)   . Chronic nonintractable headache   . GERD (gastroesophageal reflux disease) 07/21/2016  . TIA (transient ischemic attack) 07/04/2016  . Hyperkalemia 12/07/2015  . Diabetic foot infection (HCC) 12/05/2015  . Herpes simplex labialis   . Altered mental status   . Thrush 10/20/2015  . HSV-1 (herpes simplex virus 1) infection   . Hallucinations   . Acute respiratory failure with hypoxia (HCC) 10/14/2015  . Hyperglycemia 10/13/2015  . Diabetes mellitus due to underlying condition with hyperosmolarity without nonketotic hyperglycemic-hyperosmolar coma [facility]) (HCC) 10/13/2015  . CKD (chronic kidney disease), stage III 10/13/2015  . AKI (acute kidney injury) (HCC) 10/13/2015  . Diastolic CHF (HCC) 10/13/2015  . Acute encephalopathy 10/13/2015  . HHNC (hyperglycemic hyperosmolar nonketotic coma) (HCC) 10/13/2015  . Lactic acid acidosis   . Diabetic foot ulcer (HCC) 12/08/2014  . Symptomatic bradycardia 03/04/2014  . OSA on CPAP with oxygen 03/04/2014  . COPD (chronic obstructive pulmonary disease), on home O2 03/04/2014  . Bradycardia 03/04/2014  . Open wound of knee, leg (except thigh), and ankle, complicated 09/17/2013  . Cellulitis 08/16/2013  . Chronic diastolic heart failure (HCC) 08/16/2013  . Sepsis (HCC) 08/16/2013  . Left leg cellulitis 08/16/2013  . SOB (shortness of breath) 04/20/2013  . Proteinuria 01/26/2013  . Acute on chronic kidney failure (HCC) 01/24/2013  . Abscess of groin, right 01/23/2013  . HTN (hypertension) 01/23/2013  . Leukocytosis 01/23/2013  . Anemia of chronic  disease 01/23/2013  . Hypothyroidism 01/23/2013  . Hyponatremia 10/06/2011  . HYPERCHOLESTEROLEMIA 08/17/2010  . DIABETIC FOOT ULCER, TOE 02/18/2010  . Depression 02/01/2010  . TOBACCO ABUSE 06/04/2009  . Neuropathy (HCC) 01/30/2008  . Diabetes mellitus type 2, uncontrolled, with complications (HCC) 08/06/2007   Past Medical History:  Past Medical History:  Diagnosis Date  . Anxiety   . Cellulitis   . CHF (congestive heart failure) (HCC)   . Chronic bronchitis (HCC)   . Complication of anesthesia 01/2013   "didn't know where I was; who I was; talking out the top of my head" (05/19/13)  . Concussion as a teenager   slight  . COPD (chronic obstructive pulmonary disease) (HCC)    wear oxygen at home  . Daily headache   . Depression    takes CYmbalta daily  . Diabetic neuropathy (HCC)   . Diarrhea    pt takes Reglan tid  . GERD (gastroesophageal reflux disease)   . Hyperlipidemia   . Hypertension   . Hypothyroidism    takes Synthroid daily  . Kidney stone    "passed on before" (2013/05/19)  . MRSA (methicillin resistant staph aureus) culture positive    hx of 2012  . Neuromuscular disorder (HCC)   . Non-healing wound of lower extremity    lt foot  . Orthopnea    "just one; yesterday morning" (05/19/13)  . OSA on CPAP    and oxygen  . Peripheral vascular disease (HCC)   . Pneumonia 1980's   "hospitalized w/it" (May 19, 2013)  . Rheumatoid arthritis (HCC)    "both knees" (05-19-2013)  . Staph infection 2007  . Symptomatic bradycardia 03/04/2014  . Thyroid disease   .  Type II diabetes mellitus (HCC)    Novolog,Levemir,and Lovaza daily   Past Surgical History:  Past Surgical History:  Procedure Laterality Date  . AMPUTATION    . BLADDER SURGERY  1970's   "stretched the mouth of my bladder" (04/21/2013)  . DILATION AND CURETTAGE OF UTERUS  1980's  . FOOT SURGERY Right 2012   "put pins in one year; amputated toes another OR" (04/21/2013)  . GANGLION CYST EXCISION Left 1990's    "wrist" (04/21/2013)  . I&D EXTREMITY Left 08/25/2013   Procedure: IRRIGATION AND DEBRIDEMENT EXTREMITY;  Surgeon: Axel Filler, MD;  Location: [facility] OR;  Service: General;  Laterality: Left;  . INCISION AND DRAINAGE ABSCESS N/A 01/24/2013   Procedure: INCISION AND DRAINAGE ABSCESS;  Surgeon: Clovis Pu. Cornett, MD;  Location: [facility] OR;  Service: General;  Laterality: N/A;  . INGUINAL HERNIA REPAIR N/A 01/28/2013   Procedure: I&D Right Groin Wound;  Surgeon: Shelly Rubenstein, MD;  Location: [facility] OR;  Service: General;  Laterality: N/A;  . LEG AMPUTATION BELOW KNEE Right 2012  . PACEMAKER INSERTION  03/05/14   Biotronik dual chamber pacemaker implanted by Dr Graciela Husbands for symptomatic bradycardia  . PERMANENT PACEMAKER INSERTION N/A 03/05/2014   Procedure: PERMANENT PACEMAKER INSERTION;  Surgeon: Duke Salvia, MD;  Location: [facility] CATH LAB;  Service: Cardiovascular;  Laterality: N/A;  . REFRACTIVE SURGERY Bilateral 2014  . STUMP REVISION Right 12/11/2014   Procedure: Right Below Knee Amputation Revision;  Surgeon: Nadara Mustard, MD;  Location: [facility] OR;  Service: Orthopedics;  Laterality: Right;  . TOE AMPUTATION Left ?2011    only 2 toes remaning.   . TUBAL LIGATION  1990's  . WRIST SURGERY Right 1980's   "pinched nerve" (04/21/2013)   HPI:  Nichole A Beasleyis a 55 y.o.femalewith medical history significant of TIA, hypertension, hyperlipidemia, diabetes mellitus, GERD, hypothyroidism, depression, anxiety, rheumatoid arthritis, PVD, OSA, dCHF, pacemaker placement, CKD-III, tobacco abuse, R BAK, who presents with left facial numbness, leg weakness,ataxia and vision change.  Most recent head CT is negative for acute findings.  She is unable to have an MRI due to a pacemaker.     Assessment / Plan / Recommendation Clinical Impression  Cognitive/linguistic and motor speech evaluation were completed.  The patient's speech was clear and easy to understand.  Oral mechanism exam was completed and unremarkable.  The  patient achieved a score of 27/30 on the Mini Mental State Exam.  She was oriented x4, had good immediate recall and attention to task, easily named objects, repeated a short phrase and wrote a simple sentence.  She appeared to have some short term memory deficit.  Immediate recall of 3 novels words was 3/3.  Given a short delay the patient was only able to recall 2/3.  Semantic cue facilitated recall of third novel word.  Problem solving skills were functional and the patient appeared to have good insight.  She reported that she lives alone but has a caretaker in 7 days/week for about 2 hours/day to assist with meals, bathing and cleaning.  Currently acute ST needs are not identified.  Please reconsult if the patient's status changes.      SLP Assessment  Patient does not need any further Speech Lanaguage Pathology Services    Follow Up Recommendations  None          SLP Evaluation Cognition  Overall Cognitive Status: Within Functional Limits for tasks assessed Arousal/Alertness: Awake/alert Orientation Level: Oriented X4 Attention: Sustained Sustained Attention: Appears intact Memory: Impaired Memory  Impairment: Decreased short term memory;Decreased recall of new information Decreased Short Term Memory: Verbal basic Awareness: Appears intact Problem Solving: Appears intact Safety/Judgment: Appears intact       Comprehension  Auditory Comprehension Overall Auditory Comprehension: Appears within functional limits for tasks assessed Yes/No Questions: Within Functional Limits Commands: Within Functional Limits Conversation: Simple Reading Comprehension Reading Status: Within funtional limits    Expression Expression Primary Mode of Expression: Verbal Verbal Expression Overall Verbal Expression: Appears within functional limits for tasks assessed Initiation: No impairment Automatic Speech: Name;Social Response Level of Generative/Spontaneous Verbalization:  Conversation Repetition: No impairment Naming: No impairment Pragmatics: No impairment Non-Verbal Means of Communication: Not applicable Written Expression Dominant Hand: Left Written Expression: Within Functional Limits   Oral / Motor  Oral Motor/Sensory Function Overall Oral Motor/Sensory Function: Within functional limits Motor Speech Overall Motor Speech: Appears within functional limits for tasks assessed Respiration: Within functional limits Phonation: Normal Resonance: Within functional limits Articulation: Within functional limitis Intelligibility: Intelligible Motor Planning: Witnin functional limits Motor Speech Errors: Not applicable   GO          Functional Assessment Tool Used: Clinical judgment.   Functional Limitations: Memory Memory Current Status [phone]): At least 1 percent but less than 20 percent impaired, limited or restricted Memory Goal Status ([id number]): At least 1 percent but less than 20 percent impaired, limited or restricted Memory Discharge Status [phone]): At least 1 percent but less than 20 percent impaired, limited or restricted          Dimas Aguas, MA, CCC-SLP Acute Rehab SLP [phone] Fleet Contras 07/22/2016, 1:58 PM

## 2016-07-22 NOTE — Consult Note (Signed)
Neurology Consult Note  Reason for Consultation: Possible migraine  Requesting provider: Celso Sickle, MD  CC: L face numbness  HPI: This is a 54-yo RH woman who presented to the ED yesterday for evaluation of L face numbness. She states that this started two days ago and has been persistent and unchanged since. There has been no numbness of the L arm or leg. She denies any weakness, gait changes, trouble speaking or swallowing, facial droop, double vision, or vision loss. She has noticed that looking at the TV makes her feel like she is "cross-eyed" but she repeatedly denies double vision. She had a non-specific headache yesterday but this has resolved. She has had some vomiting this morning but feels OK right now.   She was recently admitted to [facility] from 11/14-11/16/17 for evaluation of TIA after reporting repeated episodes of R sided weakness and slurred speech. I saw her at the time of her admission on 07/04/16. She was followed by the stroke service with [facility] x2 negative for acute stroke. She could not have MRI due to her pacer. She had a normal transcranial Doppler on 07/06/16. Carotid Dopplers showed no significant stenosis. TTE showed moderate LVH, EF 60-70%, and a calcified mitral annulus. She was discharged on aspirin 325 mg daily--she was not on antiplatelet therapy prior to that admission.  PMH:  Past Medical History:  Diagnosis Date  . Anxiety   . Cellulitis   . CHF (congestive heart failure) (HCC)   . Chronic bronchitis (HCC)   . Complication of anesthesia 01/2013   "didn't know where I was; who I was; talking out the top of my head" (05-18-2013)  . Concussion as a teenager   slight  . COPD (chronic obstructive pulmonary disease) (HCC)    wear oxygen at home  . Daily headache   . Depression    takes CYmbalta daily  . Diabetic neuropathy (HCC)   . Diarrhea    pt takes Reglan tid  . GERD (gastroesophageal reflux disease)   . Hyperlipidemia   . Hypertension   .  Hypothyroidism    takes Synthroid daily  . Kidney stone    "passed on before" (05-18-13)  . MRSA (methicillin resistant staph aureus) culture positive    hx of 2012  . Neuromuscular disorder (HCC)   . Non-healing wound of lower extremity    lt foot  . Orthopnea    "just one; yesterday morning" (May 18, 2013)  . OSA on CPAP    and oxygen  . Peripheral vascular disease (HCC)   . Pneumonia 1980's   "hospitalized w/it" (05/18/13)  . Rheumatoid arthritis (HCC)    "both knees" (05/18/2013)  . Staph infection 2007  . Symptomatic bradycardia 03/04/2014  . Thyroid disease   . Type II diabetes mellitus (HCC)    Novolog,Levemir,and Lovaza daily    PSH:  Past Surgical History:  Procedure Laterality Date  . AMPUTATION    . BLADDER SURGERY  1970's   "stretched the mouth of my bladder" (18-May-2013)  . DILATION AND CURETTAGE OF UTERUS  1980's  . FOOT SURGERY Right 2012   "put pins in one year; amputated toes another OR" (2013-05-18)  . GANGLION CYST EXCISION Left 1990's   "wrist" (2013-05-18)  . I&D EXTREMITY Left 08/25/2013   Procedure: IRRIGATION AND DEBRIDEMENT EXTREMITY;  Surgeon: Axel Filler, MD;  Location: [facility] OR;  Service: General;  Laterality: Left;  . INCISION AND DRAINAGE ABSCESS N/A 01/24/2013   Procedure: INCISION AND DRAINAGE ABSCESS;  Surgeon: Clovis Pu. Cornett,  MD;  Location: [facility] OR;  Service: General;  Laterality: N/A;  . INGUINAL HERNIA REPAIR N/A 01/28/2013   Procedure: I&D Right Groin Wound;  Surgeon: Shelly Rubenstein, MD;  Location: [facility] OR;  Service: General;  Laterality: N/A;  . LEG AMPUTATION BELOW KNEE Right 2012  . PACEMAKER INSERTION  03/05/14   Biotronik dual chamber pacemaker implanted by Dr Graciela Husbands for symptomatic bradycardia  . PERMANENT PACEMAKER INSERTION N/A 03/05/2014   Procedure: PERMANENT PACEMAKER INSERTION;  Surgeon: Duke Salvia, MD;  Location[facility] C[facility] LAB;  Service: Cardiovascular;  Laterality: N/A;  . REFRACTIVE SURGERY Bilateral 2014  . STUMP REVISION Right  12/11/2014   Procedure: Right Below Knee Amputation Revision;  Surgeon: Nadara Mustard, MD;  Location: [facility] OR;  Service: Orthopedics;  Laterality: Right;  . TOE AMPUTATION Left ?2011    only 2 toes remaning.   . TUBAL LIGATION  1990's  . WRIST SURGERY Right 1980's   "pinched nerve" (04/21/2013)    Family history: Family History  Problem Relation Age of Onset  . Hypertension Mother   . Hyperlipidemia Mother   . Hypertrophic cardiomyopathy Mother     has pacer  . Alzheimer's disease Father   . Hypertension Brother   . Hyperlipidemia Brother   . Cancer Maternal Aunt   . Diabetes Paternal Aunt   . Diabetes Paternal Uncle   . Diabetes Paternal Grandmother   . Anesthesia problems Neg Hx   . Hypotension Neg Hx   . Malignant hyperthermia Neg Hx   . Pseudochol deficiency Neg Hx     Social history:  Social History   Social History  . Marital status: Single    Spouse name: N/A  . Number of children: N/A  . Years of education: N/A   Occupational History  . Not on file.   Social History Main Topics  . Smoking status: Current Every Day Smoker    Packs/day: 0.50    Years: 33.00    Types: Cigarettes  . Smokeless tobacco: Never Used  . Alcohol use No  . Drug use: No  . Sexual activity: No   Other Topics Concern  . Not on file   Social History Narrative  . No narrative on file    Current outpatient meds: Current Meds  Medication Sig  . acyclovir ointment (ZOVIRAX) 5 % Apply topically every 3 (three) hours.  Marland Kitchen amLODipine (NORVASC) 10 MG tablet Take 1 tablet (10 mg total) by mouth daily.  Marland Kitchen aspirin EC 325 MG EC tablet Take 1 tablet (325 mg total) by mouth daily.  Marland Kitchen aspirin EC 81 MG tablet Take 81 mg by mouth daily.  . carvedilol (COREG) 3.125 MG tablet Take 1 tablet (3.125 mg total) by mouth 2 (two) times daily with a meal.  . FLUoxetine (PROZAC) 20 MG tablet Take 60 mg by mouth daily.  . furosemide (LASIX) 40 MG tablet Take 0.5 tablets (20 mg total) by mouth daily.  .  insulin aspart (NOVOLOG FLEXPEN) 100 UNIT/ML FlexPen Inject 0-50 Units into the skin 3 (three) times daily with meals. Per sliding scale  . insulin detemir (LEVEMIR) 100 UNIT/ML injection Inject 0.55 mLs (55 Units total) into the skin 2 (two) times daily. (Patient taking differently: Inject 45 Units into the skin 2 (two) times daily. )  . levothyroxine (SYNTHROID, LEVOTHROID) 25 MCG tablet Take 2 tablets (50 mcg total) by mouth every morning.  . metoCLOPramide (REGLAN) 10 MG tablet Take 10 mg by mouth 4 (four) times daily. Take 1 tablet (  10 mg) by mouth 30 minutes before meals and at bedtime  . omeprazole (PRILOSEC) 40 MG capsule Take 40 mg by mouth at bedtime.  Marland Kitchen. oxybutynin (DITROPAN-XL) 5 MG 24 hr tablet Take 10 mg by mouth daily.   . rosuvastatin (CRESTOR) 20 MG tablet Take 40 mg by mouth daily.     Current inpatient meds:  Current Facility-Administered Medications  Medication Dose Route Frequency Provider Last Rate Last Dose  .  stroke: mapping our early stages of recovery book   Does not apply Once Lorretta HarpXilin Niu, MD      . acetaminophen (TYLENOL) tablet 650 mg  650 mg Oral Q4H PRN Lorretta HarpXilin Niu, MD       Or  . acetaminophen (TYLENOL) suppository 650 mg  650 mg Rectal Q4H PRN Lorretta HarpXilin Niu, MD      . acyclovir ointment (ZOVIRAX) 5 %   Topical Q3H Lorretta HarpXilin Niu, MD      . amLODipine (NORVASC) tablet 10 mg  10 mg Oral Daily Lorretta HarpXilin Niu, MD   10 mg at 07/22/16 0946  . aspirin EC tablet 325 mg  325 mg Oral Daily Lorretta HarpXilin Niu, MD   325 mg at 07/22/16 0945  . carvedilol (COREG) tablet 3.125 mg  3.125 mg Oral BID WC Lorretta HarpXilin Niu, MD   3.125 mg at 07/22/16 0754  . enoxaparin (LOVENOX) injection 40 mg  40 mg Subcutaneous Q24H Lorretta HarpXilin Niu, MD   40 mg at 07/22/16 0044  . fenofibrate tablet 160 mg  160 mg Oral Daily Maretta BeesShanker M Ghimire, MD   160 mg at 07/22/16 0945  . FLUoxetine (PROZAC) capsule 60 mg  60 mg Oral Daily Lorretta HarpXilin Niu, MD   60 mg at 07/22/16 0945  . furosemide (LASIX) tablet 20 mg  20 mg Oral Daily Lorretta HarpXilin Niu, MD   20  mg at 07/22/16 0945  . insulin aspart (novoLOG) injection 0-9 Units  0-9 Units Subcutaneous TID WC Lorretta HarpXilin Niu, MD   5 Units at 07/22/16 [phone]  . insulin detemir (LEVEMIR) injection 30 Units  30 Units Subcutaneous BID Lorretta HarpXilin Niu, MD   30 Units at 07/22/16 [phone]  . levothyroxine (SYNTHROID, LEVOTHROID) tablet 50 mcg  50 mcg Oral QAC breakfast Lorretta HarpXilin Niu, MD   50 mcg at 07/22/16 0754  . lisinopril (PRINIVIL,ZESTRIL) tablet 5 mg  5 mg Oral Daily Lorretta HarpXilin Niu, MD   5 mg at 07/22/16 0945  . metoCLOPramide (REGLAN) tablet 10 mg  10 mg Oral QID Lorretta HarpXilin Niu, MD   10 mg at 07/22/16 0945  . nicotine (NICODERM CQ - dosed in mg/24 hours) patch 21 mg  21 mg Transdermal Daily Lorretta HarpXilin Niu, MD      . ondansetron [facility](ZOFRAN) injection 4 mg  4 mg Intravenous Q8H PRN Lorretta HarpXilin Niu, MD      . oxybutynin (DITROPAN-XL) 24 hr tablet 10 mg  10 mg Oral Daily Lorretta HarpXilin Niu, MD   10 mg at 07/22/16 0945  . pantoprazole (PROTONIX) EC tablet 40 mg  40 mg Oral Daily Lorretta HarpXilin Niu, MD   40 mg at 07/22/16 0946  . rosuvastatin (CRESTOR) tablet 40 mg  40 mg Oral Daily Lorretta HarpXilin Niu, MD   40 mg at 07/22/16 0945  . senna-docusate (Senokot-S) tablet 1 tablet  1 tablet Oral QHS PRN Lorretta HarpXilin Niu, MD      . zolpidem (AMBIEN) tablet 5 mg  5 mg Oral QHS PRN Lorretta HarpXilin Niu, MD        Allergies: Allergies  Allergen Reactions  . Erythromycin Diarrhea  . Iodine-131  Nausea And Vomiting  . Gadolinium Nausea And Vomiting     Code: VOM, Desc: Pt began vomiting immed post infusion of multihance, Onset Date: [id number]   . Ivp Dye [Iodinated Diagnostic Agents] Nausea And Vomiting       . Metformin Diarrhea  . Penicillins Hives    Has patient had a PCN reaction causing immediate rash, facial/tongue/throat swelling, SOB or lightheadedness with hypotension: No Has patient had a PCN reaction causing severe rash involving mucus membranes or skin necrosis: No Has patient had a PCN reaction that required hospitalization No Has patient had a PCN reaction occurring within the last 10  years: No If all of the above answers are "NO", then may proceed with Cephalosporin use.     ROS: As per HPI. A full 14-point review of systems was performed and is otherwise unremarkable.   PE:  BP 135/62 (BP Location: Right Arm)   Pulse 72   Temp 97.7 F (36.5 C)   Resp 16   Ht 5\' 5"  (1.651 m)   Wt 100.6 kg (221 lb 12.5 oz)   SpO2 99%   BMI 36.91 kg/m   General: WDWN, no acute distress. AAO x4. Speech clear, no dysarthria. No aphasia. Follows commands briskly. Affect is bright with congruent mood. Comportment is normal.  HEENT: Normocephalic. Neck supple without LAD. MMM, OP clear. Dentition good. Sclerae anicteric. No conjunctival injection.  CV: Regular, no murmur. Carotid pulses full and symmetric, no bruits. Distal pulses 2+ and symmetric.  Lungs: CTAB.  Abdomen: Soft, non-distended, non-tender. Bowel sounds present x4.  Extremities: No C/C/E. S/p R BKA. The left leg shows changes consistent with chronic vascular disease. The L foot is wrapped in gauze.  Neuro:  CN: Pupils are equal and round. They are symmetrically reactive from 3-->2 mm. VFFTC. EOMI with breakup of smooth pursuits, no nystagmus. No reported diplopia. Facial sensation is decreased to light touch and pinprick with midline splitting. Face is symmetric at rest with normal strength and mobility. Hearing is intact to conversational voice. Palate elevates symmetrically and uvula is midline. Voice is normal in tone, pitch and quality. Bilateral SCM and trapezii are 5/5. Tongue is midline with normal bulk and mobility.  Motor: Normal bulk, tone, and strength. RLE s/p BKA. No tremor or other abnormal movements. No drift.  Sensation: Stocking distribution loss of all modalities in LLE to the knee. Pinprick is otherwise intact in the LLE and LUE. She is s/p R BKA.   DTRs: 2+, symmetric except absent L ankle jerk. S/p R BKA. L toe mute. No pathologic reflexes.  Coordination: Finger-to-nose without dysmetria. Finger taps are  normal in amplitude and speed, no decrement.    Labs:  Lab Results  Component Value Date   WBC 6.9 07/21/2016   HGB 12.9 07/21/2016   HCT 38.3 07/21/2016   PLT 192 07/21/2016   GLUCOSE 457 (H) 07/21/2016   CHOL 217 (H) 07/05/2016   TRIG 1,090 (H) 07/05/2016   HDL 23 (L) 07/05/2016   LDLCALC UNABLE TO CALCULATE IF TRIGLYCERIDE OVER 400 mg/dL 07/07/2016   ALT 19 30/86/5784   AST 22 07/21/2016   NA 133 (L) 07/21/2016   K 5.1 07/21/2016   CL 97 (L) 07/21/2016   CREATININE 1.38 (H) 07/21/2016   BUN 31 (H) 07/21/2016   CO2 25 07/21/2016   TSH 2.818 07/22/2016   INR 1.03 10/21/2015   HGBA1C 12.6 (H) 07/05/2016   MICROALBUR 1.59 07/01/2009    Imaging:  I have personally and independently reviewed  CTH without contrast from 07/21/16. No acute ischemia is present. Mild chronic small vessel disease is noted in the bihemispheric white matter. Mild atrophy is present. There is an old lacune in the R caudate.   Assessment and Plan:  1. L facial numbness: This has been present for two days. This splits the midline, suggesting this could be nonphysiologic in etiology, but thalamic strokes can sometimes cause this pattern, usually involving the hemibody. CTH did not show an acute stroke. She cannot have an MRI due to her pacer. She just had stroke w/u two weeks ago so no need to repeat any of this testing. Recommend stopping aspirin and starting Plavix 75 mg daily in its place.  2. Cerebrovascular disease: She has chronic small vessel disease on imaging. Known risk factors include DM, hyperlipidemia, HTN, prior stroke/TIA, PVD, tobacco abuse. Change aspirin to Plavix. Risk factor modification.   3. Diabetic peripheral neuropathy: Ensure adequate glucose control to minimize further nerve injury.  4. Headache: She had a non-specific headache yesterday that has since resolved. She has a history or chronic similar headaches. History does not suggest migraine. No issues at this time.    This was  discussed with the patient. She is in agreement with the plan as noted. She was given the chance to ask any questions and these were addressed to her satisfaction.   I have no additional recs and will sign off. Call if any new issues.

## 2016-07-22 NOTE — Progress Notes (Signed)
The patient's Biotronik pacemaker performs daily wireless transmissions. Called [employer] HQ for review of transmissions. As of 07/21/16 transmission, there has never been atrial fibrillation recorded by her device. Thurmon Fair, MD, [facility] [phone] office [phone] pager

## 2016-07-22 NOTE — Progress Notes (Signed)
Pt refusing CPAP at this time. Advised pt that if she decided she wanted a machine for tonight, to notify RN and RT would set one up.

## 2016-07-23 DIAGNOSIS — R200 Anesthesia of skin: Secondary | ICD-10-CM | POA: Diagnosis not present

## 2016-07-23 DIAGNOSIS — I10 Essential (primary) hypertension: Secondary | ICD-10-CM

## 2016-07-23 DIAGNOSIS — E118 Type 2 diabetes mellitus with unspecified complications: Secondary | ICD-10-CM | POA: Diagnosis not present

## 2016-07-23 DIAGNOSIS — I5032 Chronic diastolic (congestive) heart failure: Secondary | ICD-10-CM | POA: Diagnosis not present

## 2016-07-23 DIAGNOSIS — G458 Other transient cerebral ischemic attacks and related syndromes: Secondary | ICD-10-CM | POA: Diagnosis not present

## 2016-07-23 DIAGNOSIS — R51 Headache: Secondary | ICD-10-CM | POA: Diagnosis not present

## 2016-07-23 LAB — LIPID PANEL
CHOLESTEROL: 221 mg/dL — AB (ref 0–200)
HDL: 27 mg/dL — ABNORMAL LOW (ref >40)
LDL Cholesterol: UNDETERMINED mg/dL (ref 0–99)
Total CHOL/HDL Ratio: 8.2 RATIO
Triglycerides: 877 mg/dL — ABNORMAL HIGH (ref <150)
VLDL: UNDETERMINED mg/dL (ref 0–40)

## 2016-07-23 LAB — GLUCOSE, CAPILLARY
Glucose-Capillary: 299 mg/dL — ABNORMAL HIGH (ref 65–99)
Glucose-Capillary: 449 mg/dL — ABNORMAL HIGH (ref 65–99)

## 2016-07-23 MED ORDER — MECLIZINE HCL 25 MG PO TABS: 25.0000 mg | ORAL_TABLET | Freq: Three times a day (TID) | ORAL | 0 refills | Status: DC | PRN

## 2016-07-23 MED ORDER — CLOPIDOGREL BISULFATE 75 MG PO TABS: 75.0000 mg | ORAL_TABLET | Freq: Every day | ORAL | 0 refills | Status: DC

## 2016-07-23 MED ORDER — INSULIN DETEMIR 100 UNIT/ML ~~LOC~~ SOLN
50.0000 [IU] | Freq: Two times a day (BID) | SUBCUTANEOUS | Status: DC
  Filled 2016-07-23: qty 0.5

## 2016-07-23 MED ORDER — INSULIN ASPART 100 UNIT/ML ~~LOC~~ SOLN
10.0000 [IU] | Freq: Three times a day (TID) | SUBCUTANEOUS | Status: DC
  Administered 2016-07-23: 10 [IU] via SUBCUTANEOUS

## 2016-07-23 MED ORDER — FENOFIBRATE 160 MG PO TABS: 160.0000 mg | ORAL_TABLET | Freq: Every day | ORAL | 0 refills | Status: DC

## 2016-07-23 NOTE — Discharge Summary (Signed)
PATIENT DETAILS Name: [name] Age: 55 y.o. Sex: female Date of Birth: [birth year] MRN: [id number]. Admitting Physician: Lorretta Harp, MD [id number], Nolon Bussing, MD  Admit Date: 07/21/2016 Discharge date: 07/23/2016  Recommendations for Outpatient Follow-up:  1. Follow up with PCP in 1-2 weeks 2. Please obtain BMP/CBC in one week 3. Repeat Lipid panel in 3 months 4. Optimize glycemic control/insulin regimen  Admitted From:  Home with home health services  Disposition: Home with home health services   Home Health: Yes  Equipment/Devices: None  Discharge Condition: Stable  CODE STATUS: FULL CODE  Diet recommendation:  Heart Healthy / Carb Modified  Brief Summary: See H&P, Labs, Consult and Test reports for all details in brief, Patient is a 55 y.o. female with recent diagnosis of TIA requiring hospitalization and further evaluation from 11/14-11/16, readmitted on 12/1 with left sided tingling and numbness. See below for further details   Brief Hospital Course: L facial numbness-?Secondary to CVA vs TIA: Could be possible thalamic CVA per neurology-unable to do MRI to confirm as she has a pacemaker in place. Per neurology, this could be non-physiologic as well. Pacemaker interrogated, no episodes of atrial fibrillation. Will switch from aspirin to Plavix as recommended by Neurology, continue statin. PT recommending CIR-however patient refusing CIR or SNF ("have bills to pay") and is requesting she be discharge home TODAY. Will resume home health services and discharge her home at her own request. Note-she is awake and alert, and claims she can manage at home by herself.   Uncontrolled Type 2 diabetes with hyperglycemia: Recent A1c 12.6, CBGs still not adequately controlled-apparently on 55 units of Levemir to 40 units twice a day, and SSI insulin at home-will resume home regimen and have patient follow with PCP for further optimization. Suspect patient is non compliant  to diet-she was noted to have numerous "snacks" and "soda" at bedside  Hypertriglyceridemia: Recent lipid panel showed triglycerides more than 1000-on fenofibrate and statin-LFT's stable-repeat lipid panel in 3 months. Continue statin.   Hypertension: Controlled-continue amlodipine, lisinopril Lasix  Probable gastroparesis:did have a few episodes of vomiting yesterday-none today-tolerating diet (has "snacks" at bedside-CBG's elevated). No abd pain, abd exam is benign-resume oral reglan on discharge.   History of symptomatic bradycardia due to sick sinus syndrome-status post PPM placement in 2015: Paced rhythm in telemetry, pacemaker interrogated today by cardiology-no A. Fib.  Chronic diastolic heart failure: Compensated, continue Lasix.  Chronic kidney disease stage III: Creatinine close to usual baseline, follow  Hypothyroidism: Continue with Synthroid  Headaches: Not felt to be migraine at this time, supportive care only.  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   Procedures/Studies: None  Discharge Diagnoses:  Principal Problem:   TIA (transient ischemic attack) Active Problems:   Diabetes mellitus type 2, uncontrolled, with complications (HCC)   TOBACCO ABUSE   Depression   HYPERCHOLESTEROLEMIA   HTN (hypertension)   Hypothyroidism   Chronic diastolic heart failure (HCC)   Hyperglycemia   GERD (gastroesophageal reflux disease)   Left facial numbness   Cerebrovascular disease   Diabetic polyneuropathy associated with type 2 diabetes mellitus (HCC)   Chronic nonintractable headache   Discharge Instructions:  Activity:  As tolerated with Full fall precautions use walker/cane & assistance as needed  Discharge  Instructions  Ambulatory referral to Neurology    Complete by:  As directed    Diet - low sodium heart healthy    Complete by:  As directed    Diet Carb Modified    Complete by:  As directed    Discharge instructions    Complete by:  As directed    Follow with Primary MD  Sid Falcon, MD in 1 week and with Dr Pearlean Brownie (Neurology)  Please get a complete blood count and chemistry panel checked by your Primary MD at your next visit, and again as instructed by your Primary MD.  Get Medicines reviewed and adjusted: Please take all your medications with you for your next visit with your Primary MD  Laboratory/radiological data: Please request your Primary MD to go over all hospital tests and procedure/radiological results at the follow up, please ask your Primary MD to get all Hospital records sent to his/her office.  In some cases, they will be blood work, cultures and biopsy results pending at the time of your discharge. Please request that your primary care M.D. follows up on these results.  Also Note the following: If you experience worsening of your admission symptoms, develop shortness of breath, life threatening emergency, suicidal or homicidal thoughts you must seek medical attention immediately by calling 911 or calling your MD immediately  if symptoms less severe.  You must read complete instructions/literature along with all the possible adverse reactions/side effects for all the Medicines you take and that have been prescribed to you. Take any new Medicines after you have completely understood and accpet all the possible adverse reactions/side effects.   Do not drive when taking Pain medications or sleeping medications (Benzodaizepines)  Do not take more than prescribed Pain, Sleep and Anxiety Medications. It is not advisable to combine anxiety,sleep and pain medications without talking with your primary care practitioner  Special Instructions: If you have smoked or chewed  Tobacco  in the last 2 yrs please stop smoking, stop any regular Alcohol  and or any Recreational drug use.  Wear Seat belts while driving.  Please note: You were cared for by a hospitalist during your hospital stay. Once you are discharged, your primary care physician will handle any further medical issues. Please note that NO REFILLS for any discharge medications will be authorized once you are discharged, as it is imperative that you return to your primary care physician (or establish a relationship with a primary care physician if you do not have one) for your post hospital discharge needs so that they can reassess your need for medications and monitor your lab values.   Increase activity slowly    Complete by:  As directed        Medication List    STOP taking these medications   aspirin 325 MG EC tablet   aspirin EC 81 MG tablet     TAKE these medications   acyclovir ointment 5 % Commonly known as:  ZOVIRAX Apply topically every 3 (three) hours.   amLODipine 10 MG tablet Commonly known as:  NORVASC Take 1 tablet (10 mg total) by mouth daily.   carvedilol 3.125 MG tablet Commonly known as:  COREG Take 1 tablet (3.125 mg total) by mouth 2 (two) times daily with a meal.   clopidogrel 75 MG tablet Commonly known as:  PLAVIX Take 1 tablet (75 mg total) by mouth daily. Start taking on:  07/24/2016   fenofibrate 160 MG tablet Take 1 tablet (160 mg total) by mouth  daily. Start taking on:  07/24/2016   FLUoxetine 20 MG tablet Commonly known as:  PROZAC Take 60 mg by mouth daily.   furosemide 40 MG tablet Commonly known as:  LASIX Take 0.5 tablets (20 mg total) by mouth daily.   insulin detemir 100 UNIT/ML injection Commonly known as:  LEVEMIR Inject 0.55 mLs (55 Units total) into the skin 2 (two) times daily. What changed:  how much to take   levothyroxine 25 MCG tablet Commonly known as:  SYNTHROID, LEVOTHROID Take 2 tablets (50 mcg total) by mouth every morning.    lisinopril 5 MG tablet Commonly known as:  PRINIVIL,ZESTRIL Take 5 mg by mouth daily.   meclizine 25 MG tablet Commonly known as:  ANTIVERT Take 1 tablet (25 mg total) by mouth 3 (three) times daily as needed for dizziness.   metoCLOPramide 10 MG tablet Commonly known as:  REGLAN Take 10 mg by mouth 4 (four) times daily. Take 1 tablet (10 mg) by mouth 30 minutes before meals and at bedtime   NOVOLOG FLEXPEN 100 UNIT/ML FlexPen Generic drug:  insulin aspart Inject 0-50 Units into the skin 3 (three) times daily with meals. Per sliding scale   omeprazole 40 MG capsule Commonly known as:  PRILOSEC Take 40 mg by mouth at bedtime.   oxybutynin 5 MG 24 hr tablet Commonly known as:  DITROPAN-XL Take 10 mg by mouth daily.   rosuvastatin 20 MG tablet Commonly known as:  CRESTOR Take 40 mg by mouth daily.      Follow-up Information    Sid Falcon, MD. Schedule an appointment as soon as possible for a visit in 1 week(s).   Specialty:  Family Medicine Contact information: [address] [place] [address] [phone]        SETHI,PRAMOD, MD Follow up in 2 month(s).   Specialties:  Neurology, Radiology Why:  office will call you with a follow up appointment, if you do not hear from them, please do give them a call Contact information: [address] [place] [address] [phone]          Allergies  Allergen Reactions  . Erythromycin Diarrhea  . Iodine-131 Nausea And Vomiting  . Gadolinium Nausea And Vomiting     Code: VOM, Desc: Pt began vomiting immed post infusion of multihance, Onset Date: [id number]   . Ivp Dye [Iodinated Diagnostic Agents] Nausea And Vomiting       . Metformin Diarrhea  . Penicillins Hives    Has patient had a PCN reaction causing immediate rash, facial/tongue/throat swelling, SOB or lightheadedness with hypotension: No Has patient had a PCN reaction causing severe rash involving mucus membranes or skin  necrosis: No Has patient had a PCN reaction that required hospitalization No Has patient had a PCN reaction occurring within the last 10 years: No If all of the above answers are "NO", then may proceed with Cephalosporin use.     Consultations:   neurology  Other Procedures/Studies: Ct Angio Head W Or Wo Contrast  Result Date: 07/07/2016 CLINICAL DATA:  55 y/o F; right arm and leg numbness with difficulty speaking. EXAM: CT ANGIOGRAPHY HEAD AND NECK TECHNIQUE: Multidetector CT imaging of the head and neck was performed using the standard protocol during bolus administration of intravenous contrast. Multiplanar CT image reconstructions and MIPs were obtained to evaluate the vascular anatomy. Carotid stenosis measurements (when applicable) are obtained utilizing NASCET criteria, using the distal internal carotid diameter as the denominator. CONTRAST:  50  cc Isovue 370 COMPARISON:  07/05/2016 CT head.  05/04/2011 MRA head. FINDINGS: CT HEAD FINDINGS Brain: No evidence of acute infarction, hemorrhage, hydrocephalus, extra-axial collection or mass lesion/mass effect. Mild chronic microvascular ischemic changes and parenchymal volume loss. Vascular: See below. Skull: Normal. Negative for fracture or focal lesion. Sinuses: Imaged portions are clear. Orbits: No acute finding. Review of the MIP images confirms the above findings CTA NECK FINDINGS Aortic arch: Bovine arch, normal variant. Imaged portion shows no evidence of aneurysm or dissection. No significant stenosis of the major arch vessel origins. Right carotid system: No evidence of dissection, stenosis (50% or greater) or occlusion. Calcified plaque at the bifurcation with mild 30% proximal ICA stenosis. Left carotid system: No evidence of dissection, stenosis (50% or greater) or occlusion. Mixed plaque of the carotid bifurcation with minimal stenosis. Vertebral arteries: Codominant. No evidence of dissection, stenosis (50% or greater) or occlusion.  Skeleton: Mild cervical spondylosis. 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 Multilevel bony foraminal narrowing. Other neck: Left paramedian posterior lower neck subcutaneous fat cyst measuring 14 mm, probably a dermal appendage cyst. Upper chest: Clear lungs. Review of the MIP images confirms the above findings CTA HEAD FINDINGS Anterior circulation: No significant stenosis, proximal occlusion, aneurysm, or vascular malformation. Posterior circulation: No significant stenosis, proximal occlusion, aneurysm, or vascular malformation. Venous sinuses: As permitted by contrast timing, patent. Anatomic variants: Hypoplastic right A1 and large anterior communicating artery, normal variant. Large left posterior communicating artery relatively small P1 segment compatible with persistent fetal circulation. Diminutive right posterior communicating artery. Delayed phase: No abnormal intracranial enhancement. Review of the MIP images confirms the above findings IMPRESSION: 1. No acute intracranial abnormality or abnormal enhancement of the brain. 2. No evidence of dissection, hemodynamically significant stenosis, or occlusion of the carotid and vertebral arteries of the neck. 3. Bilateral carotid bifurcation atherosclerosis with mild approximately 30% stenosis. 4. Patent circle of Willis without evidence for significant stenosis, proximal occlusion, aneurysm, or vascular malformation. Electronically Signed   By: Mitzi Hansen M.D.   On: 07/07/2016 22:10   Ct Head Wo Contrast  Result Date: 07/21/2016 CLINICAL DATA:  Left facial numbness and ataxia beginning this morning. EXAM: CT HEAD WITHOUT CONTRAST TECHNIQUE: Contiguous axial images were obtained from the base of the skull through the vertex without intravenous contrast. COMPARISON:  07/07/2016 FINDINGS: Brain: No evidence of acute infarction, hemorrhage, hydrocephalus, extra-axial  collection or mass lesion/mass effect. Mild chronic small vessel disease again noted as well as old lacunar infarct involving the right caudate nucleus. Vascular: No hyperdense vessel or unexpected calcification. Skull: Normal. Negative for fracture or focal lesion. Sinuses/Orbits: No acute finding. Other: None. IMPRESSION: No acute intracranial abnormality. 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 Electronically Signed   By: Myles Rosenthal M.D.   On: 07/21/2016 18:15   Ct Head Wo Contrast  Result Date: 07/05/2016 CLINICAL DATA:  TIAs with right-sided weakness and slurred speech. Gait disturbance P EXAM: CT HEAD WITHOUT CONTRAST TECHNIQUE: Contiguous axial images were obtained from the base of the skull through the vertex without intravenous contrast. COMPARISON:  Head CT 07/04/2016 and previous FINDINGS: Brain: No sign of acute infarction, mass lesion, hemorrhage, hydrocephalus or extra-axial collection. 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 Vascular: No hyperdense vessels.  No vascular calcification. Skull: Normal Sinuses/Orbits: Clear/normal Other: None significant IMPRESSION: No acute finding by CT. I suspect that there is an old small  cortical infarction in the left posterior frontal cortex, but this was visible on prior films. Electronically Signed   By: Paulina Fusi M.D.   On: 07/05/2016 13:09   Ct Head Wo Contrast  Result Date: 07/04/2016 CLINICAL DATA:  Four day history of right-sided arm and leg weakness. Slurred speech. EXAM: CT HEAD WITHOUT CONTRAST TECHNIQUE: Contiguous axial images were obtained from the base of the skull through the vertex without intravenous contrast. COMPARISON:  October 21, 2015 FINDINGS: Brain: Mild atrophy for age is stable. There is no intracranial mass, hemorrhage, extra-axial fluid collection, or midline shift. There is mild patchy small vessel disease in the centra semiovale  bilaterally. Elsewhere gray-white compartments appear unremarkable and stable. No acute infarct is evident on this study. Vascular: There is no hyperdense vessel. There is mild calcification in the distal carotid siphon region on the left. Skull: Bony calvarium appears intact. Sinuses/Orbits: Visualized paranasal sinuses are clear. Orbits appear symmetric bilaterally Other: Mastoid air cells are clear. IMPRESSION: Mild atrophy with mild patchy periventricular small vessel disease, stable. No acute infarct evident. No mass, hemorrhage, or extra-axial fluid collection. There is mild calcification in the distal left carotid siphon region. Electronically Signed   By: Bretta Bang III M.D.   On: 07/04/2016 15:30   Ct Angio Neck W And/or Wo Contrast  Result Date: 07/07/2016 CLINICAL DATA:  55 y/o F; right arm and leg numbness with difficulty speaking. EXAM: CT ANGIOGRAPHY HEAD AND NECK TECHNIQUE: Multidetector CT imaging of the head and neck was performed using the standard protocol during bolus administration of intravenous contrast. Multiplanar CT image reconstructions and MIPs were obtained to evaluate the vascular anatomy. Carotid stenosis measurements (when applicable) are obtained utilizing NASCET criteria, using the distal internal carotid diameter as the denominator. CONTRAST:  50 cc Isovue 370 COMPARISON:  07/05/2016 CT head.  05/04/2011 MRA head. FINDINGS: CT HEAD FINDINGS Brain: No evidence of acute infarction, hemorrhage, hydrocephalus, extra-axial collection or mass lesion/mass effect. Mild chronic microvascular ischemic changes and parenchymal volume loss. Vascular: See below. Skull: Normal. Negative for fracture or focal lesion. Sinuses: Imaged portions are clear. Orbits: No acute finding. Review of the MIP images confirms the above findings CTA NECK FINDINGS Aortic arch: Bovine arch, normal variant. Imaged portion shows no evidence of aneurysm or dissection. No significant stenosis of the major  arch vessel origins. Right carotid system: No evidence of dissection, stenosis (50% or greater) or occlusion. Calcified plaque at the bifurcation with mild 30% proximal ICA stenosis. Left carotid system: No evidence of dissection, stenosis (50% or greater) or occlusion. Mixed plaque of the carotid bifurcation with minimal stenosis. Vertebral arteries: Codominant. No evidence of dissection, stenosis (50% or greater) or occlusion. Skeleton: Mild cervical spondylosis. 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 Multilevel bony foraminal narrowing. Other neck: Left paramedian posterior lower neck subcutaneous fat cyst measuring 14 mm, probably a dermal appendage cyst. Upper chest: Clear lungs. Review of the MIP images confirms the above findings CTA HEAD FINDINGS Anterior circulation: No significant stenosis, proximal occlusion, aneurysm, or vascular malformation. Posterior circulation: No significant stenosis, proximal occlusion, aneurysm, or vascular malformation. Venous sinuses: As permitted by contrast timing, patent. Anatomic variants: Hypoplastic right A1 and large anterior communicating artery, normal variant. Large left posterior communicating artery relatively small P1 segment compatible with persistent fetal circulation. Diminutive right posterior communicating artery. Delayed phase: No abnormal intracranial enhancement. Review of the MIP images confirms the above findings IMPRESSION: 1. No acute intracranial abnormality or abnormal enhancement  of the brain. 2. No evidence of dissection, hemodynamically significant stenosis, or occlusion of the carotid and vertebral arteries of the neck. 3. Bilateral carotid bifurcation atherosclerosis with mild approximately 30% stenosis. 4. Patent circle of Willis without evidence for significant stenosis, proximal occlusion, aneurysm, or vascular malformation. Electronically Signed   By: Mitzi HansenLance   [place]-Stratton M.D.   On: 07/07/2016 22:10      TODAY-DAY OF DISCHARGE:  Subjective:   [name] today has no headache,no chest abdominal pain,no new weakness tingling or numbness, feels much better wants to go home today.   Objective:   Blood pressure 132/65, pulse (!) 110, temperature 97.7 F (36.5 C), temperature source Oral, resp. rate 18, height 5\' 5"  (1.651 m), weight 100.6 kg (221 lb 12.5 oz), SpO2 94 %. No intake or output data in the 24 hours ending 07/23/16 1131 Filed Weights   07/21/16 1338 07/21/16 2030  Weight: 97.5 kg (215 lb) 100.6 kg (221 lb 12.5 oz)    Exam: Awake Alert, Oriented *3, No new F.N deficits, Normal affect [place].AT,PERRAL Supple Neck,No JVD, No cervical lymphadenopathy appriciated.  Symmetrical Chest wall movement, Good air movement bilaterally, CTAB RRR,No Gallops,Rubs or new Murmurs, No Parasternal Heave +ve B.Sounds, Abd Soft, Non tender, No organomegaly appriciated, No rebound -guarding or rigidity. No Cyanosis, Clubbing or edema, No new Rash or bruise   PERTINENT RADIOLOGIC STUDIES: Ct Angio Head W Or Wo Contrast  Result Date: 07/07/2016 CLINICAL DATA:  55 y/o F; right arm and leg numbness with difficulty speaking. EXAM: CT ANGIOGRAPHY HEAD AND NECK TECHNIQUE: Multidetector CT imaging of the head and neck was performed using the standard protocol during bolus administration of intravenous contrast. Multiplanar CT image reconstructions and MIPs were obtained to evaluate the vascular anatomy. Carotid stenosis measurements (when applicable) are obtained utilizing NASCET criteria, using the distal internal carotid diameter as the denominator. CONTRAST:  50 cc Isovue 370 COMPARISON:  07/05/2016 CT head.  05/04/2011 MRA head. FINDINGS: CT HEAD FINDINGS Brain: No evidence of acute infarction, hemorrhage, hydrocephalus, extra-axial collection or mass lesion/mass effect. Mild chronic microvascular ischemic changes and parenchymal volume loss. Vascular:  See below. Skull: Normal. Negative for fracture or focal lesion. Sinuses: Imaged portions are clear. Orbits: No acute finding. Review of the MIP images confirms the above findings CTA NECK FINDINGS Aortic arch: Bovine arch, normal variant. Imaged portion shows no evidence of aneurysm or dissection. No significant stenosis of the major arch vessel origins. Right carotid system: No evidence of dissection, stenosis (50% or greater) or occlusion. Calcified plaque at the bifurcation with mild 30% proximal ICA stenosis. Left carotid system: No evidence of dissection, stenosis (50% or greater) or occlusion. Mixed plaque of the carotid bifurcation with minimal stenosis. Vertebral arteries: Codominant. No evidence of dissection, stenosis (50% or greater) or occlusion. Skeleton: Mild cervical spondylosis. 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 Multilevel bony foraminal narrowing. Other neck: Left paramedian posterior lower neck subcutaneous fat cyst measuring 14 mm, probably a dermal appendage cyst. Upper chest: Clear lungs. Review of the MIP images confirms the above findings CTA HEAD FINDINGS Anterior circulation: No significant stenosis, proximal occlusion, aneurysm, or vascular malformation. Posterior circulation: No significant stenosis, proximal occlusion, aneurysm, or vascular malformation. Venous sinuses: As permitted by contrast timing, patent. Anatomic variants: Hypoplastic right A1 and large anterior communicating artery, normal variant. Large left posterior communicating artery relatively small P1 segment compatible with persistent fetal circulation. Diminutive right posterior communicating artery. Delayed phase: No abnormal intracranial enhancement. Review of  the MIP images confirms the above findings IMPRESSION: 1. No acute intracranial abnormality or abnormal enhancement of the brain. 2. No evidence of dissection, hemodynamically significant  stenosis, or occlusion of the carotid and vertebral arteries of the neck. 3. Bilateral carotid bifurcation atherosclerosis with mild approximately 30% stenosis. 4. Patent circle of Willis without evidence for significant stenosis, proximal occlusion, aneurysm, or vascular malformation. Electronically Signed   By: Mitzi Hansen M.D.   On: 07/07/2016 22:10   Ct Head Wo Contrast  Result Date: 07/21/2016 CLINICAL DATA:  Left facial numbness and ataxia beginning this morning. EXAM: CT HEAD WITHOUT CONTRAST TECHNIQUE: Contiguous axial images were obtained from the base of the skull through the vertex without intravenous contrast. COMPARISON:  07/07/2016 FINDINGS: Brain: No evidence of acute infarction, hemorrhage, hydrocephalus, extra-axial collection or mass lesion/mass effect. Mild chronic small vessel disease again noted as well as old lacunar infarct involving the right caudate nucleus. Vascular: No hyperdense vessel or unexpected calcification. Skull: Normal. Negative for fracture or focal lesion. Sinuses/Orbits: No acute finding. Other: None. IMPRESSION: No acute intracranial abnormality. 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 Electronically Signed   By: Myles Rosenthal M.D.   On: 07/21/2016 18:15   Ct Head Wo Contrast  Result Date: 07/05/2016 CLINICAL DATA:  TIAs with right-sided weakness and slurred speech. Gait disturbance P EXAM: CT HEAD WITHOUT CONTRAST TECHNIQUE: Contiguous axial images were obtained from the base of the skull through the vertex without intravenous contrast. COMPARISON:  Head CT 07/04/2016 and previous FINDINGS: Brain: No sign of acute infarction, mass lesion, hemorrhage, hydrocephalus or extra-axial collection. 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 Vascular: No hyperdense vessels.  No vascular calcification. Skull: Normal Sinuses/Orbits: Clear/normal Other: None significant IMPRESSION:  No acute finding by CT. 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 Electronically Signed   By: Paulina Fusi M.D.   On: 07/05/2016 13:09   Ct Head Wo Contrast  Result Date: 07/04/2016 CLINICAL DATA:  Four day history of right-sided arm and leg weakness. Slurred speech. EXAM: CT HEAD WITHOUT CONTRAST TECHNIQUE: Contiguous axial images were obtained from the base of the skull through the vertex without intravenous contrast. COMPARISON:  October 21, 2015 FINDINGS: Brain: Mild atrophy for age is stable. There is no intracranial mass, hemorrhage, extra-axial fluid collection, or midline shift. There is mild patchy small vessel disease in the centra semiovale bilaterally. Elsewhere gray-white compartments appear unremarkable and stable. No acute infarct is evident on this study. Vascular: There is no hyperdense vessel. There is mild calcification in the distal carotid siphon region on the left. Skull: Bony calvarium appears intact. Sinuses/Orbits: Visualized paranasal sinuses are clear. Orbits appear symmetric bilaterally Other: Mastoid air cells are clear. IMPRESSION: Mild atrophy with mild patchy periventricular small vessel disease, stable. No acute infarct evident. No mass, hemorrhage, or extra-axial fluid collection. There is mild calcification in the distal left carotid siphon region. Electronically Signed   By: Bretta Bang III M.D.   On: 07/04/2016 15:30   Ct Angio Neck W And/or Wo Contrast  Result Date: 07/07/2016 CLINICAL DATA:  55 y/o F; right arm and leg numbness with difficulty speaking. EXAM: CT ANGIOGRAPHY HEAD AND NECK TECHNIQUE: Multidetector CT imaging of the head and neck was performed using the standard protocol during bolus administration of intravenous contrast. Multiplanar CT image reconstructions and MIPs were  obtained to evaluate the vascular anatomy. Carotid stenosis measurements (when applicable)  are obtained utilizing NASCET criteria, using the distal internal carotid diameter as the denominator. CONTRAST:  50 cc Isovue 370 COMPARISON:  07/05/2016 CT head.  05/04/2011 MRA head. FINDINGS: CT HEAD FINDINGS Brain: No evidence of acute infarction, hemorrhage, hydrocephalus, extra-axial collection or mass lesion/mass effect. Mild chronic microvascular ischemic changes and parenchymal volume loss. Vascular: See below. Skull: Normal. Negative for fracture or focal lesion. Sinuses: Imaged portions are clear. Orbits: No acute finding. Review of the MIP images confirms the above findings CTA NECK FINDINGS Aortic arch: Bovine arch, normal variant. Imaged portion shows no evidence of aneurysm or dissection. No significant stenosis of the major arch vessel origins. Right carotid system: No evidence of dissection, stenosis (50% or greater) or occlusion. Calcified plaque at the bifurcation with mild 30% proximal ICA stenosis. Left carotid system: No evidence of dissection, stenosis (50% or greater) or occlusion. Mixed plaque of the carotid bifurcation with minimal stenosis. Vertebral arteries: Codominant. No evidence of dissection, stenosis (50% or greater) or occlusion. Skeleton: Mild cervical spondylosis. 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 Multilevel bony foraminal narrowing. Other neck: Left paramedian posterior lower neck subcutaneous fat cyst measuring 14 mm, probably a dermal appendage cyst. Upper chest: Clear lungs. Review of the MIP images confirms the above findings CTA HEAD FINDINGS Anterior circulation: No significant stenosis, proximal occlusion, aneurysm, or vascular malformation. Posterior circulation: No significant stenosis, proximal occlusion, aneurysm, or vascular malformation. Venous sinuses: As permitted by contrast timing, patent. Anatomic variants: Hypoplastic right A1 and large anterior communicating artery, normal variant.  Large left posterior communicating artery relatively small P1 segment compatible with persistent fetal circulation. Diminutive right posterior communicating artery. Delayed phase: No abnormal intracranial enhancement. Review of the MIP images confirms the above findings IMPRESSION: 1. No acute intracranial abnormality or abnormal enhancement of the brain. 2. No evidence of dissection, hemodynamically significant stenosis, or occlusion of the carotid and vertebral arteries of the neck. 3. Bilateral carotid bifurcation atherosclerosis with mild approximately 30% stenosis. 4. Patent circle of Willis without evidence for significant stenosis, proximal occlusion, aneurysm, or vascular malformation. Electronically Signed   By: Mitzi Hansen M.D.   On: 07/07/2016 22:10     PERTINENT LAB RESULTS: CBC:  Recent Labs  07/21/16 1340  WBC 6.9  HGB 12.9  HCT 38.3  PLT 192   CMET CMP     Component Value Date/Time   NA 133 (L) 07/21/2016 1340   K 5.1 07/21/2016 1340   CL 97 (L) 07/21/2016 1340   CO2 25 07/21/2016 1340   GLUCOSE 457 (H) 07/21/2016 1340   BUN 31 (H) 07/21/2016 1340   CREATININE 1.38 (H) 07/21/2016 1340   CALCIUM 9.3 07/21/2016 1340   PROT 6.7 07/21/2016 1340   ALBUMIN 2.9 (L) 07/21/2016 1340   AST 22 07/21/2016 1340   ALT 19 07/21/2016 1340   ALKPHOS 99 07/21/2016 1340   BILITOT 0.2 (L) 07/21/2016 1340   GFRNONAA 42 (L) 07/21/2016 1340   GFRAA 49 (L) 07/21/2016 1340    GFR Estimated Creatinine Clearance: 54.7 mL/min (by C-G formula based on SCr of 1.38 mg/dL (H)). No results for input(s): LIPASE, AMYLASE in the last 72 hours. No results for input(s): CKTOTAL, CKMB, CKMBINDEX, TROPONINI in the last 72 hours. Invalid input(s): POCBNP No results for input(s): DDIMER in the last 72 hours. No results for input(s): HGBA1C in the last 72 hours.  Recent Labs  07/23/16 0225  CHOL 221*  HDL 27*  LDLCALC UNABLE TO CALCULATE IF TRIGLYCERIDE OVER 400 mg/dL  TRIG 491*    CHOLHDL 8.2    Recent Labs  07/22/16 0533  TSH 2.818   No results for input(s): VITAMINB12, FOLATE, FERRITIN, TIBC, IRON, RETICCTPCT in the last 72 hours. Coags: No results for input(s): INR in the last 72 hours.  Invalid input(s): PT Microbiology: No results found for this or any previous visit (from the past 240 hour(s)).  FURTHER DISCHARGE INSTRUCTIONS:  Get Medicines reviewed and adjusted: Please take all your medications with you for your next visit with your Primary MD  Laboratory/radiological data: Please request your Primary MD to go over all hospital tests and procedure/radiological results at the follow up, please ask your Primary MD to get all Hospital records sent to his/her office.  In some cases, they will be blood work, cultures and biopsy results pending at the time of your discharge. Please request that your primary care M.D. goes through all the records of your hospital data and follows up on these results.  Also Note the following: If you experience worsening of your admission symptoms, develop shortness of breath, life threatening emergency, suicidal or homicidal thoughts you must seek medical attention immediately by calling 911 or calling your MD immediately  if symptoms less severe.  You must read complete instructions/literature along with all the possible adverse reactions/side effects for all the Medicines you take and that have been prescribed to you. Take any new Medicines after you have completely understood and accpet all the possible adverse reactions/side effects.   Do not drive when taking Pain medications or sleeping medications (Benzodaizepines)  Do not take more than prescribed Pain, Sleep and Anxiety Medications. It is not advisable to combine anxiety,sleep and pain medications without talking with your primary care practitioner  Special Instructions: If you have smoked or chewed Tobacco  in the last 2 yrs please stop smoking, stop any regular  Alcohol  and or any Recreational drug use.  Wear Seat belts while driving.  Please note: You were cared for by a hospitalist during your hospital stay. Once you are discharged, your primary care physician will handle any further medical issues. Please note that NO REFILLS for any discharge medications will be authorized once you are discharged, as it is imperative that you return to your primary care physician (or establish a relationship with a primary care physician if you do not have one) for your post hospital discharge needs so that they can reassess your need for medications and monitor your lab values.  Total Time spent coordinating discharge including counseling, education and face to face time equals  45 minutes.  SignedJeoffrey Massed 07/23/2016 11:31 AM

## 2016-07-23 NOTE — Progress Notes (Signed)
Physical Therapy Treatment Patient Details Name: [name] MRN: [id number] DOB: 1961/02/26 Today's Date: 07/23/2016    History of Present Illness 55 yo female admitted with facial numbness. CT negative and has pacemaker so can't have MRI. 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  Encouraged patient to use her w/c at home for safety.  Patient declining CIR, wants to go home.  Recommend patient continue her HHPT at d/c.  Follow Up Recommendations  Home health PT;Supervision for mobility/OOB (Patient declining CIR - wants to go home)     Equipment Recommendations  None recommended by PT    Recommendations for Other Services Rehab consult     Precautions / Restrictions Precautions Precautions: Fall Precaution Comments: Dizziness; Rt LE prosthesis Restrictions Weight Bearing Restrictions: No    Mobility  Bed Mobility Overal bed mobility: Modified Independent Bed Mobility: Supine to Sit;Sit to Supine     Supine to sit: Modified independent (Device/Increase time) Sit to supine: Modified independent (Device/Increase time)   General bed mobility comments: No physical assist needed.  Increased time.  Transfers Overall transfer level: Needs assistance Equipment used: Rolling walker (2 wheeled);None Transfers: Sit to/from [employer] to Stand: Min guard Stand pivot transfers: Min assist       General transfer comment: Assist to steady during transfers.  Patient transfers bed <> BSC with min assist for balance.  Ambulation/Gait Ambulation/Gait assistance: Min assist Ambulation Distance (Feet): 6 Feet Assistive device: Rolling walker (2 wheeled) Gait Pattern/deviations: Step-through pattern;Decreased stride length;Trunk flexed Gait velocity: decreased Gait velocity interpretation: Below normal speed for age/gender General Gait Details: Patient able to  ambulate 6' with RW and min assist for balance.  Patient with lean to left.  Patient reports she needs to sit down due to dizziness.  Encouraged patient to use her w/c at home for safety.   Stairs            Wheelchair Mobility    Modified Rankin (Stroke Patients Only)       Balance           Standing balance support: Single extremity supported Standing balance-[name]: Poor Standing balance comment: Loss of balance to left side                    Cognition Arousal/Alertness: Awake/alert Behavior During Therapy: WFL for tasks assessed/performed Overall Cognitive Status: Within Functional Limits for tasks assessed                      Exercises      General Comments        Pertinent Vitals/Pain Pain Assessment: No/denies pain Pain Score: 2  Pain Location: Head Pain Descriptors / Indicators: Headache Pain Intervention(s): Monitored during session    Home Living                      Prior Function            PT Goals (current goals can now be found in the care plan section) Acute Rehab PT Goals Patient Stated Goal: to get better Progress towards PT goals: Progressing toward goals    Frequency    Min 3X/week      PT Plan Discharge plan needs to be updated    Co-evaluation  End of Session Equipment Utilized During Treatment: Gait belt Activity Tolerance: Patient limited by fatigue (Limited by dizziness) Patient left: in bed;with call bell/phone within reach;with bed alarm set     Time: 1401-1410 PT Time Calculation (min) (ACUTE ONLY): 9 min  Charges:  $Gait Training: 8-22 mins $Therapeutic Activity: 23-37 mins                    G Codes:  Functional Assessment Tool Used: clinical judgement Functional Limitation: Mobility: Walking and moving around Mobility: Walking and Moving Around Goal Status [phone]): At least 1 percent but less than 20 percent impaired, limited or restricted Mobility:  Walking and Moving Around Discharge Status [phone]): At least 1 percent but less than 20 percent impaired, limited or restricted   Vena Austria 07/23/2016, 2:21 PM Durenda Hurt. Renaldo Fiddler, [facility] Acute Rehab Services Pager [phone]

## 2016-07-23 NOTE — Progress Notes (Signed)
Physical Therapy Treatment Patient Details Name: [name] MRN: [id number] DOB: Nov 20, 1960 Today's Date: 07/23/2016    History of Present Illness 55 yo female admitted with facial numbness. CT negative and has pacemaker so can't have MRI. 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  Unable to attempt gait due to dizziness.  Patient does not appear to have peripheral vestibular issues - see Vestibular Assessment in noted below.  Appears to be central in nature.  Patient lives alone and do not feel she is safe to return home alone at this time.  Recommend Inpatient Rehab consult for more comprehensive therapies with goal to reach Mod I functional level to return home safely.   Follow Up Recommendations  CIR;Supervision for mobility/OOB     Equipment Recommendations  None recommended by PT    Recommendations for Other Services Rehab consult     Precautions / Restrictions Precautions Precautions: Fall Precaution Comments: Dizziness; Rt LE prosthesis Required Braces or Orthoses: Other Brace/Splint Restrictions Weight Bearing Restrictions: No    Mobility  Bed Mobility Overal bed mobility: Modified Independent Bed Mobility: Sit to Supine Rolling: Supervision Sidelying to sit: Supervision;HOB elevated   Sit to supine: Modified independent (Device/Increase time)   General bed mobility comments: cues to slow pace as she reports nausea increases with inititation of movements  Transfers Overall transfer level: Needs assistance Equipment used: None Transfers: Sit to/from [employer] Sit to Stand: Min assist Stand pivot transfers: Min assist       General transfer comment: Patient requiring assist for transfers due to decreased balance.  Patient losing balance to left during transfer.  Ambulation/Gait             General Gait Details: NT   Stairs             Wheelchair Mobility    Modified Rankin (Stroke Patients Only)       Balance           Standing balance support: Single extremity supported Standing balance-[name]: Poor Standing balance comment: Loss of balance to left side                    Cognition Arousal/Alertness: Awake/alert Behavior During Therapy: WFL for tasks assessed/performed Overall Cognitive Status: Within Functional Limits for tasks assessed                      Exercises      General Comments        Pertinent Vitals/Pain Pain Assessment: 0-10 Pain Score: 2  Pain Location: Head Pain Descriptors / Indicators: Headache Pain Intervention(s): Monitored during session    Home Living                      Prior Function            PT Goals (current goals can now be found in the care plan section) Acute Rehab PT Goals Patient Stated Goal: to get better Progress towards PT goals: Not progressing toward goals - comment (Limited by dizziness)    Frequency    Min 3X/week      PT Plan Discharge plan needs to be updated    Co-evaluation             End of Session Equipment Utilized During Treatment: Gait belt Activity Tolerance: Patient limited by fatigue (  Limited due to dizziness) Patient left: in bed;with call bell/phone within reach;with bed alarm set     Time: 1962-2297 PT Time Calculation (min) (ACUTE ONLY): 36 min  Charges:  $Therapeutic Activity: 23-37 mins                    G Codes:      Vena Austria 07-25-2016, 10:45 AM Durenda Hurt. Renaldo Fiddler, [facility] Acute Rehab Services Pager [phone]

## 2016-07-23 NOTE — Progress Notes (Signed)
Occupational Therapy Treatment Patient Details Name: [name] MRN: [id number] DOB: 1960-10-17 Today's Date: 07/23/2016    History of present illness 55 yo female admitted with facial numbness. CT negative and has pacemaker so can't have MRI. 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 Able to tolerate bed mobility and transfer to recliner today.  However, c/o dizziness and vision changes still remain a limitation as it make the pt. Very nauseous.  Will alert OTR/L for any possible further assessments and/or tx. To address these reported symptoms.    Follow Up Recommendations  Home health OT;Supervision/Assistance - 24 hour    Equipment Recommendations  None recommended by OT    Recommendations for Other Services      Precautions / Restrictions Precautions Precautions: Fall Precaution Comments: R prosthetic in room  Required Braces or Orthoses: Other Brace/Splint       Mobility Bed Mobility Overal bed mobility: Needs Assistance Bed Mobility: Rolling;Sidelying to Sit Rolling: Supervision Sidelying to sit: Supervision;HOB elevated       General bed mobility comments: cues to slow pace as she reports nausea increases with inititation of movements  Transfers Overall transfer level: Needs assistance Equipment used: None Transfers: Stand Pivot Transfers   Stand pivot transfers: Min guard       General transfer comment: states she does not use RW for short distance transfers.  required inst. cues for safe hand placement on arm rests as she initiated the transfer with one arm on each arm rest. reviewed she could get stuck in this position and it is better is she could reach with the opposite extremity and it creates the natural turn she needs to make vs. standing in front of the chair and having to let go and make full turn before sitting down.     Balance                                   ADL Overall  ADL's : Needs assistance/impaired     Grooming: Oral care;Set up;Bed level     Upper Body Bathing Details (indicate cue type and reason): reviewed with pt., she states she has an aide that assists with transfers and bathing 7 days a week   Lower Body Bathing Details (indicate cue type and reason): reviewed with pt., she states she has an aide that assists with transfers and bathing 7 days a week     Lower Body Dressing: Set up;Sitting/lateral leans Lower Body Dressing Details (indicate cue type and reason): able to don prosthesis with set up seated Toilet Transfer: Stand-pivot;Cueing for sequencing;Cueing for safety Toilet Transfer Details (indicate cue type and reason): simulated with transfer from eob to recliner. reports she has been using the 3n1 in room Toileting- Architect and Hygiene: Min guard;Sit to/from stand Toileting - Clothing Manipulation Details (indicate cue type and reason): simulated during functional mobility in room     Functional mobility during ADLs: Min guard        Vision                 Additional Comments: states that nothing is in clear view, all is blurry and double side by side images esp. if she turns her head to look at something.  states this was not present before   Perception     Praxis      Cognition  Behavior During Therapy: WFL for tasks assessed/performed Overall Cognitive Status: Within Functional Limits for tasks assessed                       Extremity/Trunk Assessment               Exercises     Shoulder Instructions       General Comments      Pertinent Vitals/ Pain       Pain Assessment: No/denies pain  Home Living                                          Prior Functioning/Environment              Frequency  Min 2X/week        Progress Toward Goals  OT Goals(current goals can now be found in the care plan section)  Progress towards OT goals:  Progressing toward goals     Plan      Co-evaluation                 End of Session Equipment Utilized During Treatment: Gait belt   Activity Tolerance Patient tolerated treatment well   Patient Left in chair;with call bell/phone within reach   Nurse Communication          Time: 0820-0830 OT Time Calculation (min): 10 min  Charges: OT General Charges $OT Visit: 1 Procedure OT Treatments $Self Care/Home Management : 8-22 mins  Robet Leu, COTA/L 07/23/2016, 8:43 AM

## 2016-07-23 NOTE — Progress Notes (Signed)
Patient discharged home. IV and telemetry discontinued. Discharge information given. Patient questions asked and answered. Prescriptions given. Pt transported from floor via wheelchair with nurse tech. To be driven home by family. Lawson Radar

## 2016-07-23 NOTE — Progress Notes (Signed)
Rehab Admissions Coordinator Note:  Patient was screened by Trish Mage for appropriateness for an Inpatient Acute Rehab Consult.  At this time, we are recommending HH.  Doing too well to meet criteria for acute inpatient rehab admission and patient wants to go home  Trish Mage 07/23/2016, 4:16 PM  I can be reached at [phone].

## 2016-07-23 NOTE — Care Management Note (Signed)
Case Management Note  Patient Details  Name: [name] MRN: [id number] Date of Birth: June 10, 1961  Subjective/Objective:                  TIA  Action/Plan: Discharge planning Expected Discharge Date:  07/23/16               Expected Discharge Plan:  [place]  In-House Referral:     Discharge planning Services  CM Consult  Post Acute Care Choice:  Home Health Choice offered to:  Patient  DME Arranged:  N/A DME Agency:  NA  HH Arranged:  RN, PT, OT, Nurse's Aide [place] Agency:  [place]  Status of Service:  Completed, signed off  If discussed at [place] of Stay Meetings, dates discussed:    Additional Comments: CM met with pt in room to offer choice of home health agency.  Pt chooses [facility] to render HHPT/OT/RN/Aide.  Referral called to [facility] rep, Jermaine.  NO other CM needs were communicated. Dellie Catholic, RN 07/23/2016, 12:40 PM

## 2016-07-23 NOTE — Progress Notes (Signed)
No vomiting Requesting discharge-does not want to remain hospitalized any more-does not want to go to CIR or SNF. She is requesting discharge-claims that she is already getting home PT with [facility] See d/c summary for details

## 2016-07-23 NOTE — Progress Notes (Signed)
Physical Therapy Vestibular Assessment Data    07/23/16 1031  Vestibular Assessment  General Observation Patient reports dizziness and nausea occur intermittently.  No noted triggers.  Describes dizziness as imbalance to left, lightheadedness, and spinning.  Symptom Behavior  Type of Dizziness Imbalance (Spinning, lightheadedness, diplopia)  Frequency of Dizziness throughout day, near constant  Duration of Dizziness ongoing  Aggravating Factors No known aggravating factors  Relieving Factors No known relieving factors (Feels better laying down)  Occulomotor Exam  Occulomotor Alignment Abnormal (Rt eye appears adducted)  Spontaneous Absent  Gaze-induced (Horizontal eye movement with Lt gaze)  Head shaking Horizontal Absent  Head Shaking Vertical Absent  Smooth Pursuits Comment (Noted horizontal movement of eyes during testing)  Saccades Dysmetria (With vertical saccades testing)  Comment Noted horizontal movement of eyes, almost a jerking movement - no fast/slow beats.  Primarily seen during smooth pursuits and with Lt gaze.  Question disconjugate alignment with diplopia.  Vestibulo-Occular Reflex  VOR 1 Head Only (x 1 viewing) Negative  VOR to Slow Head Movement Normal  Positional Testing  Dix-Hallpike Dix-Hallpike Right;Dix-Hallpike Left  Horizontal Canal Testing Horizontal Canal Right;Horizontal Canal Left  Dix-Hallpike Right  Dix-Hallpike Right Duration Negative  Dix-Hallpike Right Symptoms No nystagmus  Dix-Hallpike Left  Dix-Hallpike Left Duration Negative  Dix-Hallpike Left Symptoms No nystagmus  Horizontal Canal Right  Horizontal Canal Right Symptoms Normal  Horizontal Canal Left  Horizontal Canal Left Symptoms Normal  Cognition  Cognition Orientation Level Oriented x 4  Durenda Hurt. Renaldo Fiddler, [facility] Acute Rehab Services Pager [phone]

## 2016-07-24 LAB — GLUCOSE, CAPILLARY: GLUCOSE-CAPILLARY: 279 mg/dL — AB (ref 65–99)

## 2016-07-25 ENCOUNTER — Ambulatory Visit (INDEPENDENT_AMBULATORY_CARE_PROVIDER_SITE_OTHER): Payer: Medicare HMO | Admitting: Orthopedic Surgery

## 2016-07-26 ENCOUNTER — Encounter: Payer: Self-pay | Admitting: Internal Medicine

## 2016-07-26 ENCOUNTER — Ambulatory Visit (INDEPENDENT_AMBULATORY_CARE_PROVIDER_SITE_OTHER): Payer: Medicare HMO | Admitting: Internal Medicine

## 2016-07-26 VITALS — BP 130/78 | HR 72 | Ht 65.0 in | Wt 216.8 lb

## 2016-07-26 DIAGNOSIS — I495 Sick sinus syndrome: Secondary | ICD-10-CM

## 2016-07-26 DIAGNOSIS — Z950 Presence of cardiac pacemaker: Secondary | ICD-10-CM | POA: Diagnosis not present

## 2016-07-26 NOTE — Progress Notes (Signed)
Patient Care Team: Loyal Jacobson, MD as PCP - General (Family Medicine)   HPI  [name] is a 55 y.o. female Seen following pacemaker implantation for sinus node dysfunction 7/15 ; echocardiogram 9/14 demonstrated normal LV function without evidence of HCM  S/pR BKA  Uses prosthesis   She has shortness of breath with minimal effort.  She also has lightheadedness with prolonged walking. She denies shower intolerance. She has some orthostatic intolerance at home.  She was recently hospitalized with a TIA. A couple weeks ago she had been hospitalized dysarthria prior TIA.  Aspirin was switched to Plavix. Statin therapy was initiated  Hemoglobin A1c most recently was in the 9 range    Past Medical History:  Diagnosis Date  . Anxiety   . Cellulitis   . CHF (congestive heart failure) (HCC)   . Chronic bronchitis (HCC)   . Complication of anesthesia 01/2013   "didn't know where I was; who I was; talking out the top of my head" (2013/05/10)  . Concussion as a teenager   slight  . COPD (chronic obstructive pulmonary disease) (HCC)    wear oxygen at home  . Daily headache   . Depression    takes CYmbalta daily  . Diabetic neuropathy (HCC)   . Diarrhea    pt takes Reglan tid  . GERD (gastroesophageal reflux disease)   . Hyperlipidemia   . Hypertension   . Hypothyroidism    takes Synthroid daily  . Kidney stone    "passed on before" (May 10, 2013)  . MRSA (methicillin resistant staph aureus) culture positive    hx of 2012  . Neuromuscular disorder (HCC)   . Non-healing wound of lower extremity    lt foot  . Orthopnea    "just one; yesterday morning" (05/10/2013)  . OSA on CPAP    and oxygen  . Peripheral vascular disease (HCC)   . Pneumonia 1980's   "hospitalized w/it" (05-10-13)  . Rheumatoid arthritis (HCC)    "both knees" (05-10-2013)  . Staph infection 2007  . Symptomatic bradycardia 03/04/2014  . Thyroid disease   . Type II diabetes mellitus (HCC)    Novolog,Levemir,and Lovaza daily    Past Surgical History:  Procedure Laterality Date  . AMPUTATION    . BLADDER SURGERY  1970's   "stretched the mouth of my bladder" (10-May-2013)  . DILATION AND CURETTAGE OF UTERUS  1980's  . FOOT SURGERY Right 2012   "put pins in one year; amputated toes another OR" (2013/05/10)  . GANGLION CYST EXCISION Left 1990's   "wrist" (10-May-2013)  . I&D EXTREMITY Left 08/25/2013   Procedure: IRRIGATION AND DEBRIDEMENT EXTREMITY;  Surgeon: Axel Filler, MD;  Location: [facility] OR;  Service: General;  Laterality: Left;  . INCISION AND DRAINAGE ABSCESS N/A 01/24/2013   Procedure: INCISION AND DRAINAGE ABSCESS;  Surgeon: Clovis Pu. Cornett, MD;  Location: [facility] OR;  Service: General;  Laterality: N/A;  . INGUINAL HERNIA REPAIR N/A 01/28/2013   Procedure: I&D Right Groin Wound;  Surgeon: Shelly Rubenstein, MD;  Location: [facility] OR;  Service: General;  Laterality: N/A;  . LEG AMPUTATION BELOW KNEE Right 2012  . PACEMAKER INSERTION  03/05/14   Biotronik dual chamber pacemaker implanted by Dr Graciela Husbands for symptomatic bradycardia  . PERMANENT PACEMAKER INSERTION N/A 03/05/2014   Procedure: PERMANENT PACEMAKER INSERTION;  Surgeon: Duke Salvia, MD;  Location: [facility] CATH LAB;  Service: Cardiovascular;  Laterality: N/A;  . REFRACTIVE SURGERY Bilateral 2014  . STUMP REVISION Right  12/11/2014   Procedure: Right Below Knee Amputation Revision;  Surgeon: Nadara Mustard, MD;  Location: [facility] OR;  Service: Orthopedics;  Laterality: Right;  . TOE AMPUTATION Left ?2011    only 2 toes remaning.   . TUBAL LIGATION  1990's  . WRIST SURGERY Right 1980's   "pinched nerve" (04/21/2013)    Current Outpatient Prescriptions  Medication Sig Dispense Refill  . acyclovir ointment (ZOVIRAX) 5 % Apply topically every 3 (three) hours. 5 g 0  . amLODipine (NORVASC) 10 MG tablet Take 1 tablet (10 mg total) by mouth daily. 30 tablet 0  . carvedilol (COREG) 3.125 MG tablet Take 1 tablet (3.125 mg total) by mouth 2 (two)  times daily with a meal. 30 tablet 0  . clopidogrel (PLAVIX) 75 MG tablet Take 1 tablet (75 mg total) by mouth daily. 30 tablet 0  . fenofibrate 160 MG tablet Take 1 tablet (160 mg total) by mouth daily. 30 tablet 0  . FLUoxetine (PROZAC) 20 MG tablet Take 60 mg by mouth daily.  0  . furosemide (LASIX) 40 MG tablet Take 0.5 tablets (20 mg total) by mouth daily. 30 tablet 0  . insulin aspart (NOVOLOG FLEXPEN) 100 UNIT/ML FlexPen Inject 0-50 Units into the skin 3 (three) times daily with meals. Per sliding scale    . insulin detemir (LEVEMIR) 100 UNIT/ML injection Inject 0.55 mLs (55 Units total) into the skin 2 (two) times daily. (Patient taking differently: Inject 45 Units into the skin 2 (two) times daily. ) 10 mL 0  . levothyroxine (SYNTHROID, LEVOTHROID) 25 MCG tablet Take 2 tablets (50 mcg total) by mouth every morning. 30 tablet 0  . lisinopril (PRINIVIL,ZESTRIL) 5 MG tablet Take 5 mg by mouth daily.    . meclizine (ANTIVERT) 25 MG tablet Take 1 tablet (25 mg total) by mouth 3 (three) times daily as needed for dizziness. 30 tablet 0  . metoCLOPramide (REGLAN) 10 MG tablet Take 10 mg by mouth 4 (four) times daily. Take 1 tablet (10 mg) by mouth 30 minutes before meals and at bedtime  11  . omeprazole (PRILOSEC) 40 MG capsule Take 40 mg by mouth at bedtime.  3  . oxybutynin (DITROPAN-XL) 5 MG 24 hr tablet Take 10 mg by mouth daily.     . rosuvastatin (CRESTOR) 20 MG tablet Take 40 mg by mouth daily.   6   No current facility-administered medications for this visit.     Allergies  Allergen Reactions  . Erythromycin Diarrhea  . Iodine-131 Nausea And Vomiting  . Gadolinium Nausea And Vomiting     Code: VOM, Desc: Pt began vomiting immed post infusion of multihance, Onset Date: [id number]   . Ivp Dye [Iodinated Diagnostic Agents] Nausea And Vomiting       . Metformin Diarrhea  . Penicillins Hives    Has patient had a PCN reaction causing immediate rash, facial/tongue/throat swelling, SOB  or lightheadedness with hypotension: No Has patient had a PCN reaction causing severe rash involving mucus membranes or skin necrosis: No Has patient had a PCN reaction that required hospitalization No Has patient had a PCN reaction occurring within the last 10 years: No If all of the above answers are "NO", then may proceed with Cephalosporin use.     Review of Systems negative except from HPI and PMH  Physical Exam BP 130/78   Pulse 72   Ht 5\' 5"  (1.651 m)   Wt 216 lb 12.8 oz (98.3 kg)   SpO2 98%  BMI 36.08 kg/m  Well developed and well nourished in no acute distress room cells of cigarette smoke HENT normal E scleral and icterus clear Neck Supple JVP flat; carotids brisk and full Clear to ausculation Device pocket well healed; without hematoma or erythema.  There is no tethering Regular rate and rhythm, no murmurs gallops or rub Soft with active bowel sounds No clubbing cyanosis  prothesis  Alert and oriented, grossly normal motor and sensory function Skin Warm and Dry  ECG 07/26/16 Intervals 18/11/45 Rightward axis Assessment and  Plan  Sinus node dysfunction   Pacemaker Biotronik   Syncope   Hypotension-orthostatic  Cigarette abuse  TIA    no interval Afib    Continue current meds  Continue to work on stop smoiking  Gave her the number from 1800 quit now

## 2016-07-26 NOTE — Patient Instructions (Signed)
Medication Instructions: - Your physician recommends that you continue on your current medications as directed. Please refer to the Current Medication list given to you today.  Labwork: - none ordered  Procedures/Testing: - none ordered  Follow-Up: - Your physician wants you to follow-up in: October 2018 with Nichole Balsam, NP for [name]. You will receive a reminder letter in the mail two months in advance. If you don't receive a letter, please call our office to schedule the follow-up appointment.   Any Additional Special Instructions Will Be Listed Below (If Applicable).     If you need a refill on your cardiac medications before your next appointment, please call your pharmacy.

## 2016-07-27 ENCOUNTER — Ambulatory Visit (INDEPENDENT_AMBULATORY_CARE_PROVIDER_SITE_OTHER): Payer: Medicare HMO | Admitting: Orthopedic Surgery

## 2016-07-27 ENCOUNTER — Telehealth: Payer: Self-pay | Admitting: Internal Medicine

## 2016-07-27 ENCOUNTER — Encounter (INDEPENDENT_AMBULATORY_CARE_PROVIDER_SITE_OTHER): Payer: Self-pay | Admitting: Orthopedic Surgery

## 2016-07-27 ENCOUNTER — Encounter: Payer: Self-pay | Admitting: *Deleted

## 2016-07-27 DIAGNOSIS — L97421 Non-pressure chronic ulcer of left heel and midfoot limited to breakdown of skin: Secondary | ICD-10-CM | POA: Diagnosis not present

## 2016-07-27 DIAGNOSIS — E11621 Type 2 diabetes mellitus with foot ulcer: Secondary | ICD-10-CM | POA: Diagnosis not present

## 2016-07-27 NOTE — Telephone Encounter (Signed)
New message      Pt was seen yesterday by Dr Graciela Husbands.  [employer] forgot to get the medicaid transportation sheet signed.  Please fax to [phone] a note saying pt was seen on 07-26-16 so that they can be reimbursed.  If any problems, please call

## 2016-07-27 NOTE — Progress Notes (Signed)
Wound Care Note   Patient: [name]           Date of Birth: 1961/02/01           MRN: [id number]             PCP: Sid Falcon, MD Visit Date: 07/27/2016   Assessment & Plan: Visit Diagnoses:  1. Diabetic ulcer of left midfoot associated with type 2 diabetes mellitus, limited to breakdown of skin (HCC)     Plan: Continue daily foot care. Continue protective shoe wear. Follow-up in office in 6 weeks.  Follow-Up Instructions: Return in about 6 weeks (around 09/07/2016).  Orders:  No orders of the defined types were placed in this encounter.  No orders of the defined types were placed in this encounter.     Procedures: No notes on file   Clinical Data: No additional findings.   No images are attached to the encounter.   Subjective: Chief Complaint  Patient presents with  . 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 She has had mini strokes since last office visit, in fact a week after she was seen in the office. She is doing dial soap cleansing and dry dressing changes. This is no drainage on bandage today but patient does report drainage on daily basis. She is in extra depth shoes and custom orthotics. Ambulating in a wheelchair.    Wound Check     Review of Systems  Constitutional: Negative for chills and fever.       Objective: Vital Signs: There were no vitals taken for this visit.  Physical Exam: Patient is alert and oriented. No adenopathy. Well-dressed. Normal affect. Respirations easy.  Ambulatory in a wheelchair today. Medial midfoot ulceration has completely epithelialized. Does have some callus build up. This was Pared with a #10 blade knife back to viable tissue.. Silver nitrate was used for hemostasis. No erythema and no drainage and is sign of infection.  Specialty  Comments: No specialty comments available.   PMFS History: Patient Active Problem List   Diagnosis Date Noted  . Left facial numbness   . Cerebrovascular disease   . Diabetic polyneuropathy associated with type 2 diabetes mellitus (HCC)   . Chronic nonintractable headache   . GERD (gastroesophageal reflux disease) 07/21/2016  . TIA (transient ischemic attack) 07/04/2016  . Hyperkalemia 12/07/2015  . Diabetic foot infection (HCC) 12/05/2015  . Herpes simplex labialis   . Altered mental status   . Thrush 10/20/2015  . HSV-1 (herpes simplex virus 1) infection   . Hallucinations   . Acute respiratory failure with hypoxia (HCC) 10/14/2015  . Hyperglycemia 10/13/2015  . Diabetes mellitus due to underlying condition with hyperosmolarity without nonketotic hyperglycemic-hyperosmolar coma [facility]) (HCC) 10/13/2015  . CKD (chronic kidney disease), stage III 10/13/2015  . AKI (acute kidney injury) (HCC) 10/13/2015  . Diastolic CHF (HCC) 10/13/2015  . Acute encephalopathy 10/13/2015  . HHNC (hyperglycemic hyperosmolar nonketotic coma) (HCC) 10/13/2015  . Lactic acid acidosis   . Diabetic foot ulcer (HCC) 12/08/2014  . Symptomatic bradycardia 03/04/2014  . OSA on CPAP with oxygen 03/04/2014  . COPD (chronic obstructive pulmonary disease), on home O2 03/04/2014  . Bradycardia 03/04/2014  . Open wound of knee, leg (except thigh), and ankle, complicated 09/17/2013  . Cellulitis 08/16/2013  . Chronic diastolic heart  failure (HCC) 08/16/2013  . Sepsis (HCC) 08/16/2013  . Left leg cellulitis 08/16/2013  . SOB (shortness of breath) 04/20/2013  . Proteinuria 01/26/2013  . Acute on chronic kidney failure (HCC) 01/24/2013  . Abscess of groin, right 01/23/2013  . HTN (hypertension) 01/23/2013  . Leukocytosis 01/23/2013  . Anemia of chronic disease 01/23/2013  . Hypothyroidism 01/23/2013  . Hyponatremia 10/06/2011  . HYPERCHOLESTEROLEMIA 08/17/2010  . DIABETIC FOOT ULCER, TOE 02/18/2010  .  Depression 02/01/2010  . TOBACCO ABUSE 06/04/2009  . Neuropathy (HCC) 01/30/2008  . Diabetes mellitus type 2, uncontrolled, with complications (HCC) 08/06/2007   Past Medical History:  Diagnosis Date  . Anxiety   . Cellulitis   . CHF (congestive heart failure) (HCC)   . Chronic bronchitis (HCC)   . Complication of anesthesia 01/2013   "didn't know where I was; who I was; talking out the top of my head" (Apr 23, 2013)  . Concussion as a teenager   slight  . COPD (chronic obstructive pulmonary disease) (HCC)    wear oxygen at home  . Daily headache   . Depression    takes CYmbalta daily  . Diabetic neuropathy (HCC)   . Diarrhea    pt takes Reglan tid  . GERD (gastroesophageal reflux disease)   . Hyperlipidemia   . Hypertension   . Hypothyroidism    takes Synthroid daily  . Kidney stone    "passed on before" (April 23, 2013)  . MRSA (methicillin resistant staph aureus) culture positive    hx of 2012  . Neuromuscular disorder (HCC)   . Non-healing wound of lower extremity    lt foot  . Orthopnea    "just one; yesterday morning" (2013/04/23)  . OSA on CPAP    and oxygen  . Peripheral vascular disease (HCC)   . Pneumonia 1980's   "hospitalized w/it" (April 23, 2013)  . Rheumatoid arthritis (HCC)    "both knees" (04-23-13)  . Staph infection 2007  . Symptomatic bradycardia 03/04/2014  . Thyroid disease   . Type II diabetes mellitus (HCC)    Novolog,Levemir,and Lovaza daily    Family History  Problem Relation Age of Onset  . Hypertension Mother   . Hyperlipidemia Mother   . Hypertrophic cardiomyopathy Mother     has pacer  . Alzheimer's disease Father   . Hypertension Brother   . Hyperlipidemia Brother   . Cancer Maternal Aunt   . Diabetes Paternal Aunt   . Diabetes Paternal Uncle   . Diabetes Paternal Grandmother   . Anesthesia problems Neg Hx   . Hypotension Neg Hx   . Malignant hyperthermia Neg Hx   . Pseudochol deficiency Neg Hx    Past Surgical History:  Procedure  Laterality Date  . AMPUTATION    . BLADDER SURGERY  1970's   "stretched the mouth of my bladder" (04/23/13)  . DILATION AND CURETTAGE OF UTERUS  1980's  . FOOT SURGERY Right 2012   "put pins in one year; amputated toes another OR" (2013/04/23)  . GANGLION CYST EXCISION Left 1990's   "wrist" (04/23/2013)  . I&D EXTREMITY Left 08/25/2013   Procedure: IRRIGATION AND DEBRIDEMENT EXTREMITY;  Surgeon: Axel Filler, MD;  Location: [facility] OR;  Service: General;  Laterality: Left;  . INCISION AND DRAINAGE ABSCESS N/A 01/24/2013   Procedure: INCISION AND DRAINAGE ABSCESS;  Surgeon: Clovis Pu. Cornett, MD;  Location: [facility] OR;  Service: General;  Laterality: N/A;  . INGUINAL HERNIA REPAIR N/A 01/28/2013   Procedure: I&D Right Groin Wound;  Surgeon: Shelly Rubenstein, MD;  Location: [facility] OR;  Service: General;  Laterality: N/A;  . LEG AMPUTATION BELOW KNEE Right 2012  . PACEMAKER INSERTION  03/05/14   Biotronik dual chamber pacemaker implanted by Dr Graciela Husbands for symptomatic bradycardia  . PERMANENT PACEMAKER INSERTION N/A 03/05/2014   Procedure: PERMANENT PACEMAKER INSERTION;  Surgeon: Duke Salvia, MD;  Location: [facility] CATH LAB;  Service: Cardiovascular;  Laterality: N/A;  . REFRACTIVE SURGERY Bilateral 2014  . STUMP REVISION Right 12/11/2014   Procedure: Right Below Knee Amputation Revision;  Surgeon: Nadara Mustard, MD;  Location: [facility] OR;  Service: Orthopedics;  Laterality: Right;  . TOE AMPUTATION Left ?2011    only 2 toes remaning.   . TUBAL LIGATION  1990's  . WRIST SURGERY Right 1980's   "pinched nerve" (04/21/2013)   Social History   Occupational History  . Not on file.   Social History Main Topics  . Smoking status: Current Every Day Smoker    Packs/day: 0.50    Years: 33.00    Types: Cigarettes  . Smokeless tobacco: Never Used  . Alcohol use No  . Drug use: No  . Sexual activity: No

## 2016-07-27 NOTE — Telephone Encounter (Signed)
Letter faxed to [employer] [phone]. Confirmation received.

## 2016-09-07 ENCOUNTER — Ambulatory Visit (INDEPENDENT_AMBULATORY_CARE_PROVIDER_SITE_OTHER): Payer: Medicare HMO | Admitting: Orthopedic Surgery

## 2016-09-08 ENCOUNTER — Telehealth (INDEPENDENT_AMBULATORY_CARE_PROVIDER_SITE_OTHER): Payer: Self-pay | Admitting: *Deleted

## 2016-09-08 NOTE — Telephone Encounter (Signed)
[facility] called for verbal home health nurse for evaluation and management for left foot wound that is open again.

## 2016-09-08 NOTE — Telephone Encounter (Signed)
Called to give verbal ok for the pt to have [facility] pt has an appt on Tuesday 09/12/16 will give updated orders after that visit.

## 2016-09-11 ENCOUNTER — Telehealth (INDEPENDENT_AMBULATORY_CARE_PROVIDER_SITE_OTHER): Payer: Self-pay | Admitting: Orthopedic Surgery

## 2016-09-11 NOTE — Telephone Encounter (Signed)
[name] (nurse) with [facility] called advised saw patient today put dry dressing and wrapped ft up. Arline Asp advised patient has appointment with Dr Lajoyce Corners tomorrow and she will need new orders to continue [facility] care. Patient has callous on left foot measuring 1 by 1. No drainage or bleeding. The number to contact Arline Asp is [phone]

## 2016-09-12 ENCOUNTER — Ambulatory Visit (INDEPENDENT_AMBULATORY_CARE_PROVIDER_SITE_OTHER): Payer: Medicare HMO | Admitting: Orthopedic Surgery

## 2016-09-12 ENCOUNTER — Encounter (INDEPENDENT_AMBULATORY_CARE_PROVIDER_SITE_OTHER): Payer: Self-pay | Admitting: Orthopedic Surgery

## 2016-09-12 DIAGNOSIS — E11621 Type 2 diabetes mellitus with foot ulcer: Secondary | ICD-10-CM

## 2016-09-12 DIAGNOSIS — L97421 Non-pressure chronic ulcer of left heel and midfoot limited to breakdown of skin: Secondary | ICD-10-CM | POA: Diagnosis not present

## 2016-09-12 DIAGNOSIS — E1142 Type 2 diabetes mellitus with diabetic polyneuropathy: Secondary | ICD-10-CM

## 2016-09-12 NOTE — Progress Notes (Signed)
Office Visit Note   Patient: [name]           Date of Birth: 02-Sep-1960           MRN: [id number] Visit Date: 09/12/2016              Requested by: Loyal Jacobson, MD [address] [place], [place] [address] PCP: Sid Falcon, MD   Assessment & Plan: Visit Diagnoses:  1. Diabetic ulcer of left midfoot associated with type 2 diabetes mellitus, limited to breakdown of skin (HCC)   2. Diabetic polyneuropathy associated with type 2 diabetes mellitus (HCC)     Plan: We'll have the patient hold on physical therapy at this time until the ulcer heals. She was given a prescription for biotech for custom shoes custom orthotics with a spacer for her partial first ray amputation. Continue wound care I disorder applied today.  Follow-Up Instructions: Return in about 4 weeks (around 10/10/2016).   Orders:  No orders of the defined types were placed in this encounter.  No orders of the defined types were placed in this encounter.     Procedures: No procedures performed   Clinical Data: No additional findings.   Subjective: Chief Complaint  Patient presents with  . Left Foot - Follow-up    Ms. Tuch is here for follow up on her left foot wound.  She states that it is draining when she walks on it.  She does have drainage on the dressing today.  Patient states that she has been doing therapy and has been increasing her ambulation. She states that her shoes are 56 years old and orthotics are 56 years old as well.  Review of Systems   Objective: Vital Signs: There were no vitals taken for this visit.  Physical Exam On examination patient is alert oriented no adenopathy well-dressed normal affect respiratory effort she ambulates in a wheelchair. Examination of the left lower extremity she does have a palpable pulse there are no venous stasis ulcers she has recurrent ulcer on the midfoot of the left foot at the location of the Charcot rocker-bottom  deformity. Her first ray amputation is healed well. After informed consent a 10 blade knife was used to debride the skin and soft tissue back to bleeding viable granulation tissue. Silver nitrate was used for hemostasis. A Band-Aid and I have is or was applied. The ulcer is 2 cm in diameter and 3 mm deep. Ortho Exam  Specialty Comments:  No specialty comments available.  Imaging: No results found.   PMFS History: Patient Active Problem List   Diagnosis Date Noted  . Left facial numbness   . Cerebrovascular disease   . Diabetic polyneuropathy associated with type 2 diabetes mellitus (HCC)   . Chronic nonintractable headache   . GERD (gastroesophageal reflux disease) 07/21/2016  . TIA (transient ischemic attack) 07/04/2016  . Hyperkalemia 12/07/2015  . Diabetic foot infection (HCC) 12/05/2015  . Herpes simplex labialis   . Altered mental status   . Thrush 10/20/2015  . HSV-1 (herpes simplex virus 1) infection   . Hallucinations   . Acute respiratory failure with hypoxia (HCC) 10/14/2015  . Hyperglycemia 10/13/2015  . Diabetes mellitus due to underlying condition with hyperosmolarity without nonketotic hyperglycemic-hyperosmolar coma [facility]) (HCC) 10/13/2015  . CKD (chronic kidney disease), stage III 10/13/2015  . AKI (acute kidney injury) (HCC) 10/13/2015  . Diastolic CHF (HCC) 10/13/2015  . Acute encephalopathy 10/13/2015  . HHNC (hyperglycemic hyperosmolar nonketotic coma) (HCC)  10/13/2015  . Lactic acid acidosis   . Diabetic foot ulcer (HCC) 12/08/2014  . Symptomatic bradycardia 03/04/2014  . OSA on CPAP with oxygen 03/04/2014  . COPD (chronic obstructive pulmonary disease), on home O2 03/04/2014  . Bradycardia 03/04/2014  . Open wound of knee, leg (except thigh), and ankle, complicated 09/17/2013  . Cellulitis 08/16/2013  . Chronic diastolic heart failure (HCC) 08/16/2013  . Sepsis (HCC) 08/16/2013  . Left leg cellulitis 08/16/2013  . SOB (shortness of breath)  04/20/2013  . Proteinuria 01/26/2013  . Acute on chronic kidney failure (HCC) 01/24/2013  . Abscess of groin, right 01/23/2013  . HTN (hypertension) 01/23/2013  . Leukocytosis 01/23/2013  . Anemia of chronic disease 01/23/2013  . Hypothyroidism 01/23/2013  . Hyponatremia 10/06/2011  . HYPERCHOLESTEROLEMIA 08/17/2010  . DIABETIC FOOT ULCER, TOE 02/18/2010  . Depression 02/01/2010  . TOBACCO ABUSE 06/04/2009  . Neuropathy (HCC) 01/30/2008  . Diabetes mellitus type 2, uncontrolled, with complications (HCC) 08/06/2007   Past Medical History:  Diagnosis Date  . Anxiety   . Cellulitis   . CHF (congestive heart failure) (HCC)   . Chronic bronchitis (HCC)   . Complication of anesthesia 01/2013   "didn't know where I was; who I was; talking out the top of my head" (May 17, 2013)  . Concussion as a teenager   slight  . COPD (chronic obstructive pulmonary disease) (HCC)    wear oxygen at home  . Daily headache   . Depression    takes CYmbalta daily  . Diabetic neuropathy (HCC)   . Diarrhea    pt takes Reglan tid  . GERD (gastroesophageal reflux disease)   . Hyperlipidemia   . Hypertension   . Hypothyroidism    takes Synthroid daily  . Kidney stone    "passed on before" (17-May-2013)  . MRSA (methicillin resistant staph aureus) culture positive    hx of 2012  . Neuromuscular disorder (HCC)   . Non-healing wound of lower extremity    lt foot  . Orthopnea    "just one; yesterday morning" (2013/05/17)  . OSA on CPAP    and oxygen  . Peripheral vascular disease (HCC)   . Pneumonia 1980's   "hospitalized w/it" (2013/05/17)  . Rheumatoid arthritis (HCC)    "both knees" (05-17-13)  . Staph infection 2007  . Symptomatic bradycardia 03/04/2014  . Thyroid disease   . Type II diabetes mellitus (HCC)    Novolog,Levemir,and Lovaza daily    Family History  Problem Relation Age of Onset  . Hypertension Mother   . Hyperlipidemia Mother   . Hypertrophic cardiomyopathy Mother     has pacer  .  Alzheimer's disease Father   . Hypertension Brother   . Hyperlipidemia Brother   . Cancer Maternal Aunt   . Diabetes Paternal Aunt   . Diabetes Paternal Uncle   . Diabetes Paternal Grandmother   . Anesthesia problems Neg Hx   . Hypotension Neg Hx   . Malignant hyperthermia Neg Hx   . Pseudochol deficiency Neg Hx     Past Surgical History:  Procedure Laterality Date  . AMPUTATION    . BLADDER SURGERY  1970's   "stretched the mouth of my bladder" (2013-05-17)  . DILATION AND CURETTAGE OF UTERUS  1980's  . FOOT SURGERY Right 2012   "put pins in one year; amputated toes another OR" (May 17, 2013)  . GANGLION CYST EXCISION Left 1990's   "wrist" (17-May-2013)  . I&D EXTREMITY Left 08/25/2013   Procedure: IRRIGATION AND DEBRIDEMENT EXTREMITY;  Surgeon: Axel Filler, MD;  Location: [facility] OR;  Service: General;  Laterality: Left;  . INCISION AND DRAINAGE ABSCESS N/A 01/24/2013   Procedure: INCISION AND DRAINAGE ABSCESS;  Surgeon: Clovis Pu. Cornett, MD;  Location: [facility] OR;  Service: General;  Laterality: N/A;  . INGUINAL HERNIA REPAIR N/A 01/28/2013   Procedure: I&D Right Groin Wound;  Surgeon: Shelly Rubenstein, MD;  Location: [facility] OR;  Service: General;  Laterality: N/A;  . LEG AMPUTATION BELOW KNEE Right 2012  . PACEMAKER INSERTION  03/05/14   Biotronik dual chamber pacemaker implanted by Dr Graciela Husbands for symptomatic bradycardia  . PERMANENT PACEMAKER INSERTION N/A 03/05/2014   Procedure: PERMANENT PACEMAKER INSERTION;  Surgeon: Duke Salvia, MD;  Location: [facility] CATH LAB;  Service: Cardiovascular;  Laterality: N/A;  . REFRACTIVE SURGERY Bilateral 2014  . STUMP REVISION Right 12/11/2014   Procedure: Right Below Knee Amputation Revision;  Surgeon: Nadara Mustard, MD;  Location: [facility] OR;  Service: Orthopedics;  Laterality: Right;  . TOE AMPUTATION Left ?2011    only 2 toes remaning.   . TUBAL LIGATION  1990's  . WRIST SURGERY Right 1980's   "pinched nerve" (04/21/2013)   Social History   Occupational History    . Not on file.   Social History Main Topics  . Smoking status: Current Every Day Smoker    Packs/day: 0.50    Years: 33.00    Types: Cigarettes  . Smokeless tobacco: Never Used  . Alcohol use No  . Drug use: No  . Sexual activity: No

## 2016-09-14 ENCOUNTER — Telehealth (INDEPENDENT_AMBULATORY_CARE_PROVIDER_SITE_OTHER): Payer: Self-pay | Admitting: Orthopedic Surgery

## 2016-09-14 ENCOUNTER — Other Ambulatory Visit (INDEPENDENT_AMBULATORY_CARE_PROVIDER_SITE_OTHER): Payer: Self-pay | Admitting: Orthopedic Surgery

## 2016-09-14 MED ORDER — SILVER SULFADIAZINE 1 % EX CREA: TOPICAL_CREAM | CUTANEOUS | 0 refills | Status: DC

## 2016-09-14 NOTE — Telephone Encounter (Signed)
I called and lm on vm to advise that per the dictation that the pt is to hold on physical therapy right now until the ulcer is healed and that she can continue with [facility] wound care and that we applied iodororb to her ulcer the day of her appt and to call the office if she has any questions.

## 2016-09-14 NOTE — Telephone Encounter (Signed)
Pt Nurse called and needs to clarify frequency of pt  nursing [facility] order please  Arline Asp- [phone]

## 2016-09-27 ENCOUNTER — Telehealth (INDEPENDENT_AMBULATORY_CARE_PROVIDER_SITE_OTHER): Payer: Self-pay | Admitting: Orthopedic Surgery

## 2016-09-27 NOTE — Telephone Encounter (Signed)
Cindy Pruett from [facility] called to let Dr. Lajoyce Corners know that [name]'s wound is not healing and getting larger, but no signs of infection.  She is concerned about it.  Cb#[phone].  Thank You.

## 2016-09-28 NOTE — Telephone Encounter (Signed)
Can you please call and make appt with erin Friday?

## 2016-10-02 ENCOUNTER — Ambulatory Visit (INDEPENDENT_AMBULATORY_CARE_PROVIDER_SITE_OTHER): Payer: Medicare HMO | Admitting: Neurology

## 2016-10-02 ENCOUNTER — Encounter: Payer: Self-pay | Admitting: Neurology

## 2016-10-02 VITALS — BP 102/51 | HR 68 | Ht 65.0 in

## 2016-10-02 DIAGNOSIS — G459 Transient cerebral ischemic attack, unspecified: Secondary | ICD-10-CM

## 2016-10-02 NOTE — Patient Instructions (Signed)
I had a long d/w patient about her recent TIA, silent basal ganglia infarct risk for recurrent stroke/TIAs, personally independently reviewed imaging studies and stroke evaluation results and answered questions.Continue Plavix  for secondary stroke prevention and maintain strict control of hypertension with blood pressure goal below 130/90, diabetes with hemoglobin A1c goal below 6.5% and lipids with LDL cholesterol goal below 70 mg/dL. I counseled her to quit smoking completely as it on a single cigarette a day increase the risk for strokes and cardiac disease. She was also counseled to see her pulmonologist to discuss CPAP or alternative treatments for sleep apnea.. I advised her to see her endocrinologist to discuss more optimal diabetes control I also advised the patient to eat a healthy diet with plenty of whole grains, cereals, fruits and vegetables, and maintain ideal body weight Followup in the future with my nurse practitioner in 6 months or call earlier if necessary Stroke Prevention Some medical conditions and behaviors are associated with an increased chance of having a stroke. You may prevent a stroke by making healthy choices and managing medical conditions. How can I reduce my risk of having a stroke?  Stay physically active. Get at least 30 minutes of activity on most or all days.  Do not smoke. It may also be helpful to avoid exposure to secondhand smoke.  Limit alcohol use. Moderate alcohol use is considered to be:  No more than 2 drinks per day for men.  No more than 1 drink per day for nonpregnant women.  Eat healthy foods. This involves:  Eating 5 or more servings of fruits and vegetables a day.  Making dietary changes that address high blood pressure (hypertension), high cholesterol, diabetes, or obesity.  Manage your cholesterol levels.  Making food choices that are high in fiber and low in saturated fat, trans fat, and cholesterol may control cholesterol levels.  Take  any prescribed medicines to control cholesterol as directed by your health care provider.  Manage your diabetes.  Controlling your carbohydrate and sugar intake is recommended to manage diabetes.  Take any prescribed medicines to control diabetes as directed by your health care provider.  Control your hypertension.  Making food choices that are low in salt (sodium), saturated fat, trans fat, and cholesterol is recommended to manage hypertension.  Ask your health care provider if you need treatment to lower your blood pressure. Take any prescribed medicines to control hypertension as directed by your health care provider.  If you are 13-67 years of age, have your blood pressure checked every 3-5 years. If you are 59 years of age or older, have your blood pressure checked every year.  Maintain a healthy weight.  Reducing calorie intake and making food choices that are low in sodium, saturated fat, trans fat, and cholesterol are recommended to manage weight.  Stop drug abuse.  Avoid taking birth control pills.  Talk to your health care provider about the risks of taking birth control pills if you are over 60 years old, smoke, get migraines, or have ever had a blood clot.  Get evaluated for sleep disorders (sleep apnea).  Talk to your health care provider about getting a sleep evaluation if you snore a lot or have excessive sleepiness.  Take medicines only as directed by your health care provider.  For some people, aspirin or blood thinners (anticoagulants) are helpful in reducing the risk of forming abnormal blood clots that can lead to stroke. If you have the irregular heart rhythm of atrial fibrillation, you  should be on a blood thinner unless there is a good reason you cannot take them.  Understand all your medicine instructions.  Make sure that other conditions (such as anemia or atherosclerosis) are addressed. Get help right away if:  You have sudden weakness or numbness of  the face, arm, or leg, especially on one side of the body.  Your face or eyelid droops to one side.  You have sudden confusion.  You have trouble speaking (aphasia) or understanding.  You have sudden trouble seeing in one or both eyes.  You have sudden trouble walking.  You have dizziness.  You have a loss of balance or coordination.  You have a sudden, severe headache with no known cause.  You have new chest pain or an irregular heartbeat. Any of these symptoms may represent a serious problem that is an emergency. Do not wait to see if the symptoms will go away. Get medical help at once. Call your local emergency services (911 in [place] Do not drive yourself to the hospital.  This information is not intended to replace advice given to you by your health care provider. Make sure you discuss any questions you have with your health care provider. Document Released: 09/14/2004 Document Revised: 01/13/2016 Document Reviewed: 02/07/2013 Elsevier Interactive Patient Education  2017 [employer].

## 2016-10-02 NOTE — Progress Notes (Signed)
[facility] [address] [place]. [place] [address] [phone]       OFFICE FOLLOW-UP NOTE  Nichole. Tracey Harries Date of Birth:  11-12-1960 Medical Record Number:  [id number]   HPI: [name] is a 56 year Caucasian lady seen today for the first office follow-up visit following hospital admission for TIA in December 2017 and November 2017. History is obtained from the patient and review of Hospital medical record. She presented with transient left facial numbness in the setting of mild nonspecific headache which resolved. CT scan of the head done twice showed no acute infarct but old right basal ganglia lacunar infarct. She originally presented on 07/04/2016 for evaluation for TIA with multiple repeated episode of right-sided weakness and slurred speech. CT scan of the head done twice at that time was negative for acute stroke. MRI could not be done due to pacemaker. Transcranial Doppler studies and carotid Doppler showed no significant stenosis. Transthoracic echo showed moderate left ventricular hypertrophy with ejection fraction of 60-70% with calcified mitral annulus. She was discharged on aspirin November and subsequently switched to Plavix in December. She is tolerating Plavix well without bleeding or bruising. She states her blood pressure is well controlled and today it is 107/51. She states her sugars remain uncontrolled with fasting sugars ranging in the 300 range. She does see endocrinologist at [facility] who is helping her manage her sugars better. She remains on Crestor and fenofibrate and is tolerating it them well without side effects. 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 She has a nonhealing ulcer on left foot and has been asked to be nonweightbearing for now. She has tried cutting back smoking but still smokes half pack per day. She has diagnosis of sleep apnea but is not currently using CPAP. She does  see a pulmonologist in [facility]. but has not seen him in recent months.  ROS:   14 system review of systems is positive for blurred vision, ringing in the ears, incontinence, aching muscles, headache, dizziness, depression, anxiety, decreased energy and all other systems negative  PMH:  Past Medical History:  Diagnosis Date  . Anxiety   . Cellulitis   . CHF (congestive heart failure) (HCC)   . Chronic bronchitis (HCC)   . Complication of anesthesia 01/2013   "didn't know where I was; who I was; talking out the top of my head" (2013/05/11)  . Concussion as a teenager   slight  . COPD (chronic obstructive pulmonary disease) (HCC)    wear oxygen at home  . Daily headache   . Depression    takes CYmbalta daily  . Diabetic neuropathy (HCC)   . Diarrhea    pt takes Reglan tid  . GERD (gastroesophageal reflux disease)   . Hyperlipidemia   . Hypertension   . Hypothyroidism    takes Synthroid daily  . Kidney stone    "passed on before" (05-11-2013)  . MRSA (methicillin resistant staph aureus) culture positive    hx of 2012  . Neuromuscular disorder (HCC)   . Non-healing wound of lower extremity    lt foot  . Orthopnea    "just one; yesterday morning" (May 11, 2013)  . OSA on CPAP    and oxygen  . Peripheral vascular disease (HCC)   . Pneumonia 1980's   "hospitalized w/it" (05-11-2013)  . Rheumatoid arthritis (HCC)    "both knees" (May 11, 2013)  . Staph infection 2007  . Symptomatic bradycardia 03/04/2014  .  Thyroid disease   . Type II diabetes mellitus (HCC)    Novolog,Levemir,and Lovaza daily    Social History:  Social History   Social History  . Marital status: Single    Spouse name: N/A  . Number of children: N/A  . Years of education: N/A   Occupational History  . Not on file.   Social History Main Topics  . Smoking status: Current Every Day Smoker    Packs/day: 0.50    Years: 33.00    Types: Cigarettes  . Smokeless tobacco: Never Used  . Alcohol use No  . Drug  use: No  . Sexual activity: No   Other Topics Concern  . Not on file   Social History Narrative  . No narrative on file    Medications:   Current Outpatient Prescriptions on File Prior to Visit  Medication Sig Dispense Refill  . amLODipine (NORVASC) 10 MG tablet Take 1 tablet (10 mg total) by mouth daily. 30 tablet 0  . carvedilol (COREG) 3.125 MG tablet Take 1 tablet (3.125 mg total) by mouth 2 (two) times daily with a meal. 30 tablet 0  . clopidogrel (PLAVIX) 75 MG tablet Take 1 tablet (75 mg total) by mouth daily. 30 tablet 0  . fenofibrate 160 MG tablet Take 1 tablet (160 mg total) by mouth daily. 30 tablet 0  . FLUoxetine (PROZAC) 20 MG tablet Take 60 mg by mouth daily.  0  . furosemide (LASIX) 40 MG tablet Take 0.5 tablets (20 mg total) by mouth daily. 30 tablet 0  . insulin aspart (NOVOLOG FLEXPEN) 100 UNIT/ML FlexPen Inject 0-50 Units into the skin 3 (three) times daily with meals. Per sliding scale    . insulin detemir (LEVEMIR) 100 UNIT/ML injection Inject 0.55 mLs (55 Units total) into the skin 2 (two) times daily. (Patient taking differently: Inject 45 Units into the skin 2 (two) times daily. ) 10 mL 0  . levothyroxine (SYNTHROID, LEVOTHROID) 25 MCG tablet Take 2 tablets (50 mcg total) by mouth every morning. 30 tablet 0  . lisinopril (PRINIVIL,ZESTRIL) 5 MG tablet Take 5 mg by mouth daily.    . meclizine (ANTIVERT) 25 MG tablet Take 1 tablet (25 mg total) by mouth 3 (three) times daily as needed for dizziness. 30 tablet 0  . metoCLOPramide (REGLAN) 10 MG tablet Take 10 mg by mouth 4 (four) times daily. Take 1 tablet (10 mg) by mouth 30 minutes before meals and at bedtime  11  . omeprazole (PRILOSEC) 40 MG capsule Take 40 mg by mouth at bedtime.  3  . oxybutynin (DITROPAN-XL) 5 MG 24 hr tablet Take 10 mg by mouth daily.     . rosuvastatin (CRESTOR) 20 MG tablet Take 40 mg by mouth daily.   6  . silver sulfADIAZINE (SILVADENE) 1 % cream Apply to wound daily 400 g 0   No  current facility-administered medications on file prior to visit.     Allergies:   Allergies  Allergen Reactions  . Erythromycin Diarrhea  . Iodine-131 Nausea And Vomiting  . Gadolinium Nausea And Vomiting     Code: VOM, Desc: Pt began vomiting immed post infusion of multihance, Onset Date: [id number]   . Ivp Dye [Iodinated Diagnostic Agents] Nausea And Vomiting       . Metformin Diarrhea  . Penicillins Hives    Has patient had a PCN reaction causing immediate rash, facial/tongue/throat swelling, SOB or lightheadedness with hypotension: No Has patient had a PCN reaction causing severe rash involving  mucus membranes or skin necrosis: No Has patient had a PCN reaction that required hospitalization No Has patient had a PCN reaction occurring within the last 10 years: No If all of the above answers are "NO", then may proceed with Cephalosporin use.     Physical Exam General: Obese middle-age Caucasian lady, seated, in no evident distress Head: head normocephalic and atraumatic.  Neck: supple with no carotid or supraclavicular bruits Cardiovascular: regular rate and rhythm, no murmurs Musculoskeletal: Right below-knee amputation. Left foot partial amputation Skin:  no rash/petichiae Vascular:  Unable to feel pulses in lower extremities due to amputation  Vitals:   10/02/16 1450  BP: (!) 102/51  Pulse: 68   Neurologic Exam Mental Status: Awake and fully alert. Oriented to place and time. Recent and remote memory intact. Attention span, concentration and fund of knowledge appropriate. Mood and affect appropriate.  Cranial Nerves: Fundoscopic exam reveals sharp disc margins. Pupils equal, briskly reactive to light. Extraocular movements full without nystagmus. Visual fields full to confrontation. Hearing intact. Facial sensation intact. Face, tongue, palate moves normally and symmetrically.  Motor: Normal bulk and tone. Normal strength inBilateral upper  extremity muscles., Right thigh  and left leg  Sensory.: intact to touch ,pinprick .position and vibratory sensation.  Coordination: Rapid alternating movements normal in all extremities. Finger-to-nose and heel-to-shin performed accurately bilaterally. Gait and Station: Arises from chair without difficulty. Stance is normal. Gait demonstrates normal stride length and balance . Able to heel, toe and tandem walk without difficulty.  Reflexes: 1+ and symmetric. Toes downgoing.       ASSESSMENT: 56 year old Caucasian lady with transient episode of facial numbness possibly a TIA secondary to small vessel disease. Recent history of TIA in November 2017 due to small vessel disease as well. Multiple vascular risk factors of obesity, smoking, diabetes, hyperlipidemia and silent cerebrovascular disease.    PLAN: I had a long d/w patient about her recent TIA, silent basal ganglia infarct risk for recurrent stroke/TIAs, personally independently reviewed imaging studies and stroke evaluation results and answered questions.Continue Plavix  for secondary stroke prevention and maintain strict control of hypertension with blood pressure goal below 130/90, diabetes with hemoglobin A1c goal below 6.5% and lipids with LDL cholesterol goal below 70 mg/dL. I counseled her to quit smoking completely as it on a single cigarette a day increase the risk for strokes and cardiac disease. She was also counseled to see her pulmonologist to discuss CPAP or alternative treatments for sleep apnea.. I advised her to see her endocrinologist to discuss more optimal diabetes control I also advised the patient to eat a healthy diet with plenty of whole grains, cereals, fruits and vegetables, and maintain ideal body weight Followup in the future with my nurse practitioner in 6 months or call earlier if necessary Greater than 50% of time during this 25 minute visit was spent on counseling,explanation of diagnosis, planning of further management, discussion with patient  and family and coordination of care Delia Heady, MD  [facility] [address] [place], [place] [address]  Phone [phone] Fax [phone] Note: This document was prepared with digital dictation and possible smart phrase technology. Any transcriptional errors that result from this process are unintentional

## 2016-10-10 ENCOUNTER — Ambulatory Visit (INDEPENDENT_AMBULATORY_CARE_PROVIDER_SITE_OTHER): Payer: Medicare HMO | Admitting: Orthopedic Surgery

## 2016-10-10 ENCOUNTER — Encounter (INDEPENDENT_AMBULATORY_CARE_PROVIDER_SITE_OTHER): Payer: Self-pay | Admitting: Orthopedic Surgery

## 2016-10-10 DIAGNOSIS — L97521 Non-pressure chronic ulcer of other part of left foot limited to breakdown of skin: Secondary | ICD-10-CM

## 2016-10-10 DIAGNOSIS — E11610 Type 2 diabetes mellitus with diabetic neuropathic arthropathy: Secondary | ICD-10-CM

## 2016-10-10 NOTE — Progress Notes (Signed)
Office Visit Note   Patient: [name]           Date of Birth: 08-Oct-1960           MRN: [id number] Visit Date: 10/10/2016 Requested by: Loyal Jacobson, MD [address] [place], [place] [address] PCP: Sid Falcon, MD  Subjective: Chief Complaint  Patient presents with  . Left Foot - Open Wound    Patient is here today for evaluation of left foot ulcer.  She says last night when she was getting in the shower a big piece of the skin came off.  She says that is has been bleeding/draining.  Dressing removed, dried blood on dressing, no drainage at present.                  Review of Systems   Assessment & Plan: Visit Diagnoses:  1. Non-pressure chronic ulcer of other part of left foot limited to breakdown of skin (HCC)   2. Diabetic Charct's arthropathy (HCC)     Plan: Ulcer debridement of skin and soft tissue no evidence of infection or cellulitis the ulcer does not probe to bone plan to follow-up in the office in 3 weeks continue with protective shoe wear continue with a wheelchair for ambulation.  Follow-Up Instructions: Return in about 3 weeks (around 10/31/2016).   Orders:  No orders of the defined types were placed in this encounter.  No orders of the defined types were placed in this encounter.     Procedures: No procedures performed   Clinical Data: No additional findings.  Objective: Vital Signs: There were no vitals taken for this visit.  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 There is a Wagner grade 1 ulcer beneath the rocker-bottom deformity. After informed consent a 10 blade knife was used to debride the skin and soft tissue back to bleeding viable granulation tissue. This was 100%  granulation tissue at did not probe to bone or tendon. Silver nitrate was used for hemostasis Iodosorb and a Band-Aid was applied. Patient has a palpable pulse. There is no cellulitis no induration no tenderness to palpation.  Ortho Exam  Specialty Comments:  No specialty comments available.  Imaging: No results found.   PMFS History: Patient Active Problem List   Diagnosis Date Noted  . Non-pressure chronic ulcer of other part of left foot limited to breakdown of skin (HCC) 10/10/2016  . Left facial numbness   . Cerebrovascular disease   . Diabetic polyneuropathy associated with type 2 diabetes mellitus (HCC)   . Chronic nonintractable headache   . GERD (gastroesophageal reflux disease) 07/21/2016  . TIA (transient ischemic attack) 07/04/2016  . Hyperkalemia 12/07/2015  . Diabetic foot infection (HCC) 12/05/2015  . Herpes simplex labialis   . Altered mental status   . Thrush 10/20/2015  . HSV-1 (herpes simplex virus 1) infection   . Hallucinations   . Acute respiratory failure with hypoxia (HCC) 10/14/2015  . Hyperglycemia 10/13/2015  . Diabetes mellitus due to underlying condition with hyperosmolarity without nonketotic hyperglycemic-hyperosmolar coma [facility]) (HCC) 10/13/2015  . CKD (chronic kidney disease), stage III 10/13/2015  . AKI (acute kidney injury) (HCC) 10/13/2015  . Diastolic CHF (HCC) 10/13/2015  . Acute encephalopathy 10/13/2015  . 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 Lactic acid  acidosis   . Diabetic foot ulcer (HCC) 12/08/2014  . Symptomatic bradycardia 03/04/2014  . OSA on CPAP with oxygen 03/04/2014  . COPD (chronic obstructive pulmonary disease), on home O2 03/04/2014  . Bradycardia 03/04/2014  . Open wound of knee, leg (except thigh), and ankle, complicated 09/17/2013  . Cellulitis 08/16/2013  . Chronic diastolic heart failure (HCC) 08/16/2013  . Sepsis (HCC) 08/16/2013  . Left leg cellulitis 08/16/2013  . SOB (shortness of breath)  04/20/2013  . Proteinuria 01/26/2013  . Acute on chronic kidney failure (HCC) 01/24/2013  . Abscess of groin, right 01/23/2013  . HTN (hypertension) 01/23/2013  . Leukocytosis 01/23/2013  . Anemia of chronic disease 01/23/2013  . Hypothyroidism 01/23/2013  . Hyponatremia 10/06/2011  . HYPERCHOLESTEROLEMIA 08/17/2010  . DIABETIC FOOT ULCER, TOE 02/18/2010  . Depression 02/01/2010  . TOBACCO ABUSE 06/04/2009  . Neuropathy (HCC) 01/30/2008  . Diabetes mellitus type 2, uncontrolled, with complications (HCC) 08/06/2007   Past Medical History:  Diagnosis Date  . Anxiety   . Cellulitis   . CHF (congestive heart failure) (HCC)   . Chronic bronchitis (HCC)   . Complication of anesthesia 01/2013   "didn't know where I was; who I was; talking out the top of my head" (2013-05-21)  . Concussion as a teenager   slight  . COPD (chronic obstructive pulmonary disease) (HCC)    wear oxygen at home  . Daily headache   . Depression    takes CYmbalta daily  . Diabetic neuropathy (HCC)   . Diarrhea    pt takes Reglan tid  . GERD (gastroesophageal reflux disease)   . Hyperlipidemia   . Hypertension   . Hypothyroidism    takes Synthroid daily  . Kidney stone    "passed on before" (05-21-2013)  . MRSA (methicillin resistant staph aureus) culture positive    hx of 2012  . Neuromuscular disorder (HCC)   . Non-healing wound of lower extremity    lt foot  . Orthopnea    "just one; yesterday morning" (05/21/2013)  . OSA on CPAP    and oxygen  . Peripheral vascular disease (HCC)   . Pneumonia 1980's   "hospitalized w/it" (2013-05-21)  . Rheumatoid arthritis (HCC)    "both knees" (05-21-13)  . Staph infection 2007  . Symptomatic bradycardia 03/04/2014  . Thyroid disease   . Type II diabetes mellitus (HCC)    Novolog,Levemir,and Lovaza daily    Family History  Problem Relation Age of Onset  . Hypertension Mother   . Hyperlipidemia Mother   . Hypertrophic cardiomyopathy Mother     has pacer  .  Alzheimer's disease Father   . Hypertension Brother   . Hyperlipidemia Brother   . Cancer Maternal Aunt   . Diabetes Paternal Aunt   . Diabetes Paternal Uncle   . Diabetes Paternal Grandmother   . Anesthesia problems Neg Hx   . Hypotension Neg Hx   . Malignant hyperthermia Neg Hx   . Pseudochol deficiency Neg Hx     Past Surgical History:  Procedure Laterality Date  . AMPUTATION    . BLADDER SURGERY  1970's   "stretched the mouth of my bladder" (May 21, 2013)  . DILATION AND CURETTAGE OF UTERUS  1980's  . FOOT SURGERY Right 2012   "put pins in one year; amputated toes another OR" (May 21, 2013)  . GANGLION CYST EXCISION Left 1990's   "wrist" (2013-05-21)  . I&D EXTREMITY Left 08/25/2013   Procedure: IRRIGATION AND DEBRIDEMENT EXTREMITY;  Surgeon: Axel Filler, MD;  [facility] OR;  Service: General;  Laterality: Left;  . INCISION AND DRAINAGE ABSCESS N/A 01/24/2013   Procedure: INCISION AND DRAINAGE ABSCESS;  Surgeon: Clovis Pu. Cornett, MD;  Location: [facility] OR;  Service: General;  Laterality: N/A;  . INGUINAL HERNIA REPAIR N/A 01/28/2013   Procedure: I&D Right Groin Wound;  Surgeon: Shelly Rubenstein, MD;  Location: [facility] OR;  Service: General;  Laterality: N/A;  . LEG AMPUTATION BELOW KNEE Right 2012  . PACEMAKER INSERTION  03/05/14   Biotronik dual chamber pacemaker implanted by Dr Graciela Husbands for symptomatic bradycardia  . PERMANENT PACEMAKER INSERTION N/A 03/05/2014   Procedure: PERMANENT PACEMAKER INSERTION;  Surgeon: Duke Salvia, MD;  Location: [facility] CATH LAB;  Service: Cardiovascular;  Laterality: N/A;  . REFRACTIVE SURGERY Bilateral 2014  . STUMP REVISION Right 12/11/2014   Procedure: Right Below Knee Amputation Revision;  Surgeon: Nadara Mustard, MD;  Location: [facility] OR;  Service: Orthopedics;  Laterality: Right;  . TOE AMPUTATION Left ?2011    only 2 toes remaning.   . TUBAL LIGATION  1990's  . WRIST SURGERY Right 1980's   "pinched nerve" (04/21/2013)   Social History   Occupational History    . Not on file.   Social History Main Topics  . Smoking status: Current Every Day Smoker    Packs/day: 0.50    Years: 33.00    Types: Cigarettes  . Smokeless tobacco: Never Used  . Alcohol use No  . Drug use: No  . Sexual activity: No

## 2016-10-19 ENCOUNTER — Telehealth (INDEPENDENT_AMBULATORY_CARE_PROVIDER_SITE_OTHER): Payer: Self-pay | Admitting: Orthopedic Surgery

## 2016-10-19 NOTE — Telephone Encounter (Signed)
Cindy from [facility] called to inform that they are discharging today and pt will follow up with Dr. Lajoyce Corners.   [phone]

## 2016-10-31 ENCOUNTER — Ambulatory Visit (INDEPENDENT_AMBULATORY_CARE_PROVIDER_SITE_OTHER): Payer: Medicare HMO | Admitting: Orthopedic Surgery

## 2016-11-01 ENCOUNTER — Encounter (INDEPENDENT_AMBULATORY_CARE_PROVIDER_SITE_OTHER): Payer: Self-pay | Admitting: Orthopedic Surgery

## 2016-11-01 ENCOUNTER — Ambulatory Visit (INDEPENDENT_AMBULATORY_CARE_PROVIDER_SITE_OTHER): Payer: Medicare HMO | Admitting: Orthopedic Surgery

## 2016-11-01 VITALS — Ht 65.0 in | Wt 216.0 lb

## 2016-11-01 DIAGNOSIS — E11610 Type 2 diabetes mellitus with diabetic neuropathic arthropathy: Secondary | ICD-10-CM

## 2016-11-01 DIAGNOSIS — L97521 Non-pressure chronic ulcer of other part of left foot limited to breakdown of skin: Secondary | ICD-10-CM

## 2016-11-01 NOTE — Progress Notes (Deleted)
Office Visit Note   Patient: [name]           Date of Birth: 01/09/1961           MRN: [id number] Visit Date: 11/01/2016              Requested by: Loyal Jacobson, MD [address] [place], [place] [address] PCP: Sid Falcon, MD  Chief Complaint  Patient presents with  . Left Foot - Open Wound    HPI: Patient is her for left foot ulcer on plantar aspect of foot. She is non weight bearing in a wheelchair with an extra depth shoe and orthotic. She is applying a dry dressing daily and is not currently taking an abx. There is an odor and a small amount of pink drainage. [name], RMA    Assessment & Plan: Visit Diagnoses: No diagnosis found.  Plan: ***  Follow-Up Instructions: No Follow-up on file.   Ortho Exam *** ROS: *** Imaging: No results found.  Labs: Lab Results  Component Value Date   HGBA1C 12.6 (H) 07/05/2016   HGBA1C 10.0 (H) 12/05/2015   HGBA1C 11.9 (H) 10/20/2015   ESRSEDRATE >140 (H) 12/05/2015   ESRSEDRATE 119 (H) 10/21/2015   ESRSEDRATE 70 (H) 12/07/2014   CRP 22.8 (H) 12/05/2015   CRP 3.4 (H) 10/21/2015   CRP 27.6 (H) 12/07/2014   REPTSTATUS 12/09/2015 FINAL 12/04/2015   GRAMSTAIN  10/22/2015    CYTOSPIN SMEAR WBC PRESENT, PREDOMINANTLY MONONUCLEAR NO ORGANISMS SEEN    CULT NO GROWTH 5 DAYS 12/04/2015   LABORGA ESCHERICHIA COLI 10/01/2015    Orders:  No orders of the defined types were placed in this encounter.  No orders of the defined types were placed in this encounter.    Procedures: No procedures performed  Clinical Data: No additional findings.  Subjective: Review of Systems  Objective: Vital Signs: Ht 5\' 5"  (1.651 m)   Wt 216 lb (98 kg)   BMI 35.94 kg/m   Specialty Comments:  No specialty comments available.  PMFS History: Patient Active Problem List   Diagnosis Date Noted  . Non-pressure chronic ulcer of other part of left foot limited to breakdown of skin (HCC) 10/10/2016  .  Left facial numbness   . Cerebrovascular disease   . Diabetic polyneuropathy associated with type 2 diabetes mellitus (HCC)   . Chronic nonintractable headache   . GERD (gastroesophageal reflux disease) 07/21/2016  . TIA (transient ischemic attack) 07/04/2016  . Hyperkalemia 12/07/2015  . Diabetic foot infection (HCC) 12/05/2015  . Herpes simplex labialis   . Altered mental status   . Thrush 10/20/2015  . HSV-1 (herpes simplex virus 1) infection   . Hallucinations   . Acute respiratory failure with hypoxia (HCC) 10/14/2015  . Hyperglycemia 10/13/2015  . Diabetes mellitus due to underlying condition with hyperosmolarity without nonketotic hyperglycemic-hyperosmolar coma [facility]) (HCC) 10/13/2015  . CKD (chronic kidney disease), stage III 10/13/2015  . AKI (acute kidney injury) (HCC) 10/13/2015  . Diastolic CHF (HCC) 10/13/2015  . Acute encephalopathy 10/13/2015  . 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 Lactic acid acidosis   . Diabetic foot ulcer (HCC) 12/08/2014  . Symptomatic bradycardia 03/04/2014  . OSA on CPAP with oxygen 03/04/2014  . COPD (chronic obstructive pulmonary disease), on home O2 03/04/2014  . Bradycardia 03/04/2014  . Open wound of knee, leg (except thigh), and ankle, complicated 09/17/2013  . Cellulitis 08/16/2013  . Chronic diastolic heart failure (HCC) 08/16/2013  .  Sepsis (HCC) 08/16/2013  . Left leg cellulitis 08/16/2013  . SOB (shortness of breath) 04/20/2013  . Proteinuria 01/26/2013  . Acute on chronic kidney failure (HCC) 01/24/2013  . Abscess of groin, right 01/23/2013  . HTN (hypertension) 01/23/2013  . Leukocytosis 01/23/2013  . Anemia of chronic disease 01/23/2013  . Hypothyroidism 01/23/2013  . Hyponatremia 10/06/2011  . HYPERCHOLESTEROLEMIA 08/17/2010  . DIABETIC FOOT ULCER, TOE 02/18/2010  . Depression 02/01/2010  . TOBACCO ABUSE 06/04/2009  . Neuropathy (HCC) 01/30/2008  . Diabetes mellitus type 2, uncontrolled, with  complications (HCC) 08/06/2007   Past Medical History:  Diagnosis Date  . Anxiety   . Cellulitis   . CHF (congestive heart failure) (HCC)   . Chronic bronchitis (HCC)   . Complication of anesthesia 01/2013   "didn't know where I was; who I was; talking out the top of my head" (2013/05/05)  . Concussion as a teenager   slight  . COPD (chronic obstructive pulmonary disease) (HCC)    wear oxygen at home  . Daily headache   . Depression    takes CYmbalta daily  . Diabetic neuropathy (HCC)   . Diarrhea    pt takes Reglan tid  . GERD (gastroesophageal reflux disease)   . Hyperlipidemia   . Hypertension   . Hypothyroidism    takes Synthroid daily  . Kidney stone    "passed on before" (05-May-2013)  . MRSA (methicillin resistant staph aureus) culture positive    hx of 2012  . Neuromuscular disorder (HCC)   . Non-healing wound of lower extremity    lt foot  . Orthopnea    "just one; yesterday morning" (05/05/2013)  . OSA on CPAP    and oxygen  . Peripheral vascular disease (HCC)   . Pneumonia 1980's   "hospitalized w/it" (2013-05-05)  . Rheumatoid arthritis (HCC)    "both knees" (05/05/2013)  . Staph infection 2007  . Symptomatic bradycardia 03/04/2014  . Thyroid disease   . Type II diabetes mellitus (HCC)    Novolog,Levemir,and Lovaza daily    Family History  Problem Relation Age of Onset  . Hypertension Mother   . Hyperlipidemia Mother   . Hypertrophic cardiomyopathy Mother     has pacer  . Alzheimer's disease Father   . Hypertension Brother   . Hyperlipidemia Brother   . Cancer Maternal Aunt   . Diabetes Paternal Aunt   . Diabetes Paternal Uncle   . Diabetes Paternal Grandmother   . Anesthesia problems Neg Hx   . Hypotension Neg Hx   . Malignant hyperthermia Neg Hx   . Pseudochol deficiency Neg Hx     Past Surgical History:  Procedure Laterality Date  . AMPUTATION    . BLADDER SURGERY  1970's   "stretched the mouth of my bladder" (2013-05-05)  . DILATION AND CURETTAGE  OF UTERUS  1980's  . FOOT SURGERY Right 2012   "put pins in one year; amputated toes another OR" (05-May-2013)  . GANGLION CYST EXCISION Left 1990's   "wrist" (May 05, 2013)  . I&D EXTREMITY Left 08/25/2013   Procedure: IRRIGATION AND DEBRIDEMENT EXTREMITY;  Surgeon: Axel Filler, MD;  Location: [facility] OR;  Service: General;  Laterality: Left;  . INCISION AND DRAINAGE ABSCESS N/A 01/24/2013   Procedure: INCISION AND DRAINAGE ABSCESS;  Surgeon: Clovis Pu. Cornett, MD;  Location: [facility] OR;  Service: General;  Laterality: N/A;  . INGUINAL HERNIA REPAIR N/A 01/28/2013   Procedure: I&D Right Groin Wound;  Surgeon: Shelly Rubenstein, MD;  Location: [facility] OR;  Service: General;  Laterality: N/A;  . LEG AMPUTATION BELOW KNEE Right 2012  . PACEMAKER INSERTION  03/05/14   Biotronik dual chamber pacemaker implanted by Dr Graciela Husbands for symptomatic bradycardia  . PERMANENT PACEMAKER INSERTION N/A 03/05/2014   Procedure: PERMANENT PACEMAKER INSERTION;  Surgeon: Duke Salvia, MD;  Location: [facility] CATH LAB;  Service: Cardiovascular;  Laterality: N/A;  . REFRACTIVE SURGERY Bilateral 2014  . STUMP REVISION Right 12/11/2014   Procedure: Right Below Knee Amputation Revision;  Surgeon: Nadara Mustard, MD;  Location: [facility] OR;  Service: Orthopedics;  Laterality: Right;  . TOE AMPUTATION Left ?2011    only 2 toes remaning.   . TUBAL LIGATION  1990's  . WRIST SURGERY Right 1980's   "pinched nerve" (04/21/2013)   Social History   Occupational History  . Not on file.   Social History Main Topics  . Smoking status: Current Every Day Smoker    Packs/day: 0.50    Years: 33.00    Types: Cigarettes  . Smokeless tobacco: Never Used  . Alcohol use No  . Drug use: No  . Sexual activity: No

## 2016-11-03 NOTE — Progress Notes (Signed)
Office Visit Note   Patient: [name]           Date of Birth: [birth year]           MRN: [id number] Visit Date: 11/01/2016 Requested by: Nichole Jacobson, MD [address] [place], [place] [address] PCP: Nichole Falcon, MD  Subjective: Chief Complaint  Patient presents with  . Left Foot - Open Wound    Patient is her for left foot ulcer on plantar aspect of foot. She is non weight bearing in a wheelchair with an extra depth shoe and orthotic. She is applying a dry dressing daily and is not currently taking an abx. There is an odor and a small amount of pink drainage. [name], RMA  Patient is a 56 year old woman seen in follow up for ulceration left foot. This has been ongoing for years. Ambulatory in a wheechair. Does have an ED shoe left foot with custom orthotics. Is using dry dressings.                 Review of Systems  Constitutional: Negative for chills and fever.  Cardiovascular: Negative for leg swelling.  Skin: Positive for wound. Negative for color change.     Assessment & Plan: Visit Diagnoses:  1. Non-pressure chronic ulcer of other part of left foot limited to breakdown of skin (HCC)   2. Diabetic Charct's arthropathy (HCC)     Plan: Ulcer debridement of skin and soft tissue no evidence of infection or cellulitis the ulcer does not probe to bone plan to follow-up in the office in 4 weeks continue with protective shoe wear continue with a wheelchair for ambulation.  Follow-Up Instructions: No Follow-up on file.   Orders:  No orders of the defined types were placed in this encounter.  No orders of the defined types were placed in this encounter.     Procedures: No procedures performed   Clinical Data: No additional findings.  Objective: Vital Signs: Ht 5\' 5"  (1.651 m)   Wt 216 lb (98 kg)   BMI 35.94 kg/m   Physical Exam on examination patient is alert oriented poor hygiene. 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 There is a Wagner grade 1 ulcer beneath the rocker-bottom deformity. After informed consent a 10 blade knife was used to debride the skin and soft tissue back to bleeding viable granulation tissue. This was 100% granulation tissue at did not probe to bone or tendon. Iodosorb and a Band-Aid was applied. Patient has a palpable pulse. There is no cellulitis no induration no tenderness to palpation.  Ortho Exam  Specialty Comments:  No specialty comments available.  Imaging: No results found.   PMFS History: Patient Active Problem List   Diagnosis Date Noted  . Non-pressure chronic ulcer of other part of left foot limited to breakdown of skin (HCC) 10/10/2016  . Left facial numbness   . Cerebrovascular disease   . Diabetic polyneuropathy associated with type 2 diabetes mellitus (HCC)   . Chronic nonintractable headache   . GERD (gastroesophageal reflux disease) 07/21/2016  . TIA (transient ischemic attack) 07/04/2016  . Hyperkalemia 12/07/2015  . Diabetic foot infection (HCC) 12/05/2015  . Herpes simplex labialis   . Altered mental status   . Thrush 10/20/2015  . HSV-1 (herpes simplex virus 1) infection   .  Hallucinations   . Acute respiratory failure with hypoxia (HCC) 10/14/2015  . Hyperglycemia 10/13/2015  . Diabetes mellitus due to underlying condition with hyperosmolarity without nonketotic hyperglycemic-hyperosmolar coma [facility]) (HCC) 10/13/2015  . CKD (chronic kidney disease), stage III 10/13/2015  . AKI (acute kidney injury) (HCC) 10/13/2015  . Diastolic CHF (HCC) 10/13/2015  . Acute encephalopathy 10/13/2015  . 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 Lactic acid acidosis   . Diabetic foot ulcer (HCC) 12/08/2014  . Symptomatic bradycardia 03/04/2014  . OSA on CPAP with oxygen  03/04/2014  . COPD (chronic obstructive pulmonary disease), on home O2 03/04/2014  . Bradycardia 03/04/2014  . Open wound of knee, leg (except thigh), and ankle, complicated 09/17/2013  . Cellulitis 08/16/2013  . Chronic diastolic heart failure (HCC) 08/16/2013  . Sepsis (HCC) 08/16/2013  . Left leg cellulitis 08/16/2013  . SOB (shortness of breath) 04/20/2013  . Proteinuria 01/26/2013  . Acute on chronic kidney failure (HCC) 01/24/2013  . Abscess of groin, right 01/23/2013  . HTN (hypertension) 01/23/2013  . Leukocytosis 01/23/2013  . Anemia of chronic disease 01/23/2013  . Hypothyroidism 01/23/2013  . Hyponatremia 10/06/2011  . HYPERCHOLESTEROLEMIA 08/17/2010  . DIABETIC FOOT ULCER, TOE 02/18/2010  . Depression 02/01/2010  . TOBACCO ABUSE 06/04/2009  . Neuropathy (HCC) 01/30/2008  . Diabetes mellitus type 2, uncontrolled, with complications (HCC) 08/06/2007   Past Medical History:  Diagnosis Date  . Anxiety   . Cellulitis   . CHF (congestive heart failure) (HCC)   . Chronic bronchitis (HCC)   . Complication of anesthesia 01/2013   "didn't know where I was; who I was; talking out the top of my head" (May 14, 2013)  . Concussion as a teenager   slight  . COPD (chronic obstructive pulmonary disease) (HCC)    wear oxygen at home  . Daily headache   . Depression    takes CYmbalta daily  . Diabetic neuropathy (HCC)   . Diarrhea    pt takes Reglan tid  . GERD (gastroesophageal reflux disease)   . Hyperlipidemia   . Hypertension   . Hypothyroidism    takes Synthroid daily  . Kidney stone    "passed on before" (05-14-13)  . MRSA (methicillin resistant staph aureus) culture positive    hx of 2012  . Neuromuscular disorder (HCC)   . Non-healing wound of lower extremity    lt foot  . Orthopnea    "just one; yesterday morning" (05-14-2013)  . OSA on CPAP    and oxygen  . Peripheral vascular disease (HCC)   . Pneumonia 1980's   "hospitalized w/it" (14-May-2013)  . Rheumatoid  arthritis (HCC)    "both knees" (05-14-2013)  . Staph infection 2007  . Symptomatic bradycardia 03/04/2014  . Thyroid disease   . Type II diabetes mellitus (HCC)    Novolog,Levemir,and Lovaza daily    Family History  Problem Relation Age of Onset  . Hypertension Mother   . Hyperlipidemia Mother   . Hypertrophic cardiomyopathy Mother     has pacer  . Alzheimer's disease Father   . Hypertension Brother   . Hyperlipidemia Brother   . Cancer Maternal Aunt   . Diabetes Paternal Aunt   . Diabetes Paternal Uncle   . Diabetes Paternal Grandmother   . Anesthesia problems Neg Hx   . Hypotension Neg Hx   . Malignant hyperthermia Neg Hx   . Pseudochol deficiency Neg Hx     Past Surgical History:  Procedure Laterality Date  . AMPUTATION    .  BLADDER SURGERY  1970's   "stretched the mouth of my bladder" (04/21/2013)  . DILATION AND CURETTAGE OF UTERUS  1980's  . FOOT SURGERY Right 2012   "put pins in one year; amputated toes another OR" (04/21/2013)  . GANGLION CYST EXCISION Left 1990's   "wrist" (04/21/2013)  . I&D EXTREMITY Left 08/25/2013   Procedure: IRRIGATION AND DEBRIDEMENT EXTREMITY;  Surgeon: Axel Filler, MD;  Location: [facility] OR;  Service: General;  Laterality: Left;  . INCISION AND DRAINAGE ABSCESS N/A 01/24/2013   Procedure: INCISION AND DRAINAGE ABSCESS;  Surgeon: Clovis Pu. Cornett, MD;  Location: [facility] OR;  Service: General;  Laterality: N/A;  . INGUINAL HERNIA REPAIR N/A 01/28/2013   Procedure: I&D Right Groin Wound;  Surgeon: Shelly Rubenstein, MD;  Location: [facility] OR;  Service: General;  Laterality: N/A;  . LEG AMPUTATION BELOW KNEE Right 2012  . PACEMAKER INSERTION  03/05/14   Biotronik dual chamber pacemaker implanted by Dr Graciela Husbands for symptomatic bradycardia  . PERMANENT PACEMAKER INSERTION N/A 03/05/2014   Procedure: PERMANENT PACEMAKER INSERTION;  Surgeon: Duke Salvia, MD;  Location: [facility] CATH LAB;  Service: Cardiovascular;  Laterality: N/A;  . REFRACTIVE SURGERY Bilateral 2014  .  STUMP REVISION Right 12/11/2014   Procedure: Right Below Knee Amputation Revision;  Surgeon: Nadara Mustard, MD;  Location: [facility] OR;  Service: Orthopedics;  Laterality: Right;  . TOE AMPUTATION Left ?2011    only 2 toes remaning.   . TUBAL LIGATION  1990's  . WRIST SURGERY Right 1980's   "pinched nerve" (04/21/2013)   Social History   Occupational History  . Not on file.   Social History Main Topics  . Smoking status: Current Every Day Smoker    Packs/day: 0.50    Years: 33.00    Types: Cigarettes  . Smokeless tobacco: Never Used  . Alcohol use No  . Drug use: No  . Sexual activity: No

## 2016-11-29 ENCOUNTER — Ambulatory Visit (INDEPENDENT_AMBULATORY_CARE_PROVIDER_SITE_OTHER): Payer: Medicare HMO | Admitting: Orthopedic Surgery

## 2016-11-29 ENCOUNTER — Ambulatory Visit (INDEPENDENT_AMBULATORY_CARE_PROVIDER_SITE_OTHER): Payer: Medicare HMO | Admitting: Family

## 2016-11-29 DIAGNOSIS — S91001A Unspecified open wound, right ankle, initial encounter: Secondary | ICD-10-CM

## 2016-11-29 DIAGNOSIS — S81801A Unspecified open wound, right lower leg, initial encounter: Secondary | ICD-10-CM

## 2016-11-29 DIAGNOSIS — E11610 Type 2 diabetes mellitus with diabetic neuropathic arthropathy: Secondary | ICD-10-CM

## 2016-11-29 DIAGNOSIS — Z89511 Acquired absence of right leg below knee: Secondary | ICD-10-CM | POA: Diagnosis not present

## 2016-11-29 DIAGNOSIS — L97421 Non-pressure chronic ulcer of left heel and midfoot limited to breakdown of skin: Secondary | ICD-10-CM | POA: Diagnosis not present

## 2016-11-29 DIAGNOSIS — E11621 Type 2 diabetes mellitus with foot ulcer: Secondary | ICD-10-CM

## 2016-11-29 DIAGNOSIS — S81001A Unspecified open wound, right knee, initial encounter: Secondary | ICD-10-CM

## 2016-11-29 NOTE — Progress Notes (Signed)
Office Visit Note   Patient: [name]           Date of Birth: 03/22/1961           MRN: [id number] Visit Date: 11/29/2016 Requested by: Loyal Jacobson, MD [address] [place], [place] [address] PCP: Sid Falcon, MD  Subjective: No chief complaint on file.   Patient is a 56 year old woman seen in follow up for ulceration left foot. This has been ongoing for years. Ambulatory in a wheechair. Does have an ED shoe left foot with custom orthotics. Is wrapping with kerlix. 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 She has been applying silvadene. States it is not getting better. Has been to [facility] who did not make any modifications to prosthesis. Did advise she stop wearing sleeve and prosthesis until healed.                Review of Systems  Constitutional: Negative for chills and fever.  Cardiovascular: Negative for leg swelling.  Skin: Positive for wound. Negative for color change.     Assessment & Plan: Visit Diagnoses:  1. Diabetic Charct's arthropathy (HCC)   2. Diabetic ulcer of left midfoot associated with type 2 diabetes mellitus, limited to breakdown of skin (HCC)   3. Open wound of right knee, leg, and ankle with complication, initial encounter   4. Acquired absence of right leg below knee (HCC)     Plan: Ulcer debridement of skin and soft tissue no evidence of infection or cellulitis the ulcer does not probe to bone plan to follow-up in the office in 4 weeks continue with protective shoe wear continue with a wheelchair for ambulation. Remain out of her prosthesis on the right. Apply Band-Aid with mupirocin or Silvadene daily. Discussed that if this wound is slow to heal we may need to have her return to [facility] for modifications to socket to offload pressure.  Follow-Up Instructions: No Follow-up on file.   Orders:  No orders of the defined types were placed in this encounter.  No orders of the defined types were  placed in this encounter.     Procedures: No procedures performed   Clinical Data: No additional findings.  Objective: Vital Signs: There were no vitals taken for this visit.  Physical Exam on examination patient is alert oriented poor hygiene. 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 There is a Wagner grade 1 ulcer beneath the rocker-bottom deformity. After informed consent a 10 blade knife was used to debride the skin and soft tissue back to bleeding viable granulation tissue. This was 100% granulation tissue and did not probe to bone or tendon. There is surrounding maceration callus which was part. The ulcer measures 15 mm in diameter. Is 2 mm deep. Iodosorb and a Band-Aid was applied. Patient has a palpable pulse. There is no cellulitis no induration no tenderness to palpation. The right knee ulceration to the distal tibia is 1 cm in diameter this has granulation tissue in the wound bed. Is no drainage no surrounding erythema no sign of infection. Ortho Exam  Specialty Comments:  No specialty comments available.  Imaging: No results found.   PMFS History: Patient Active Problem List   Diagnosis Date Noted  . Acquired absence of right leg below knee (HCC) 11/29/2016  . Non-pressure  chronic ulcer of other part of left foot limited to breakdown of skin (HCC) 10/10/2016  . Left facial numbness   . Cerebrovascular disease   . Diabetic polyneuropathy associated with type 2 diabetes mellitus (HCC)   . Chronic nonintractable headache   . GERD (gastroesophageal reflux disease) 07/21/2016  . TIA (transient ischemic attack) 07/04/2016  . Hyperkalemia 12/07/2015  . Herpes simplex labialis   . Altered mental status   . Thrush 10/20/2015  . HSV-1 (herpes simplex virus 1) infection   . Hallucinations     . Acute respiratory failure with hypoxia (HCC) 10/14/2015  . Hyperglycemia 10/13/2015  . Diabetes mellitus due to underlying condition with hyperosmolarity without nonketotic hyperglycemic-hyperosmolar coma [facility]) (HCC) 10/13/2015  . CKD (chronic kidney disease), stage III 10/13/2015  . AKI (acute kidney injury) (HCC) 10/13/2015  . Diastolic CHF (HCC) 10/13/2015  . Acute encephalopathy 10/13/2015  . 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 Lactic acid acidosis   . Diabetic foot ulcer (HCC) 12/08/2014  . Symptomatic bradycardia 03/04/2014  . OSA on CPAP with oxygen 03/04/2014  . COPD (chronic obstructive pulmonary disease), on home O2 03/04/2014  . Bradycardia 03/04/2014  . Open wound of knee, leg (except thigh), and ankle, complicated 09/17/2013  . Cellulitis 08/16/2013  . Chronic diastolic heart failure (HCC) 08/16/2013  . Sepsis (HCC) 08/16/2013  . SOB (shortness of breath) 04/20/2013  . Proteinuria 01/26/2013  . Acute on chronic kidney failure (HCC) 01/24/2013  . Abscess of groin, right 01/23/2013  . HTN (hypertension) 01/23/2013  . Leukocytosis 01/23/2013  . Anemia of chronic disease 01/23/2013  . Hypothyroidism 01/23/2013  . Hyponatremia 10/06/2011  . HYPERCHOLESTEROLEMIA 08/17/2010  . Depression 02/01/2010  . TOBACCO ABUSE 06/04/2009  . Diabetes mellitus type 2, uncontrolled, with complications (HCC) 08/06/2007   Past Medical History:  Diagnosis Date  . Anxiety   . Cellulitis   . CHF (congestive heart failure) (HCC)   . Chronic bronchitis (HCC)   . Complication of anesthesia 01/2013   "didn't know where I was; who I was; talking out the top of my head" (05/04/13)  . Concussion as a teenager   slight  . COPD (chronic obstructive pulmonary disease) (HCC)    wear oxygen at home  . Daily headache   . Depression    takes CYmbalta daily  . Diabetic neuropathy (HCC)   . Diarrhea    pt takes Reglan tid  . GERD (gastroesophageal reflux disease)   .  Hyperlipidemia   . Hypertension   . Hypothyroidism    takes Synthroid daily  . Kidney stone    "passed on before" (2013/05/04)  . MRSA (methicillin resistant staph aureus) culture positive    hx of 2012  . Neuromuscular disorder (HCC)   . Non-healing wound of lower extremity    lt foot  . Orthopnea    "just one; yesterday morning" (05/04/2013)  . OSA on CPAP    and oxygen  . Peripheral vascular disease (HCC)   . Pneumonia 1980's   "hospitalized w/it" (May 04, 2013)  . Rheumatoid arthritis (HCC)    "both knees" (2013-05-04)  . Staph infection 2007  . Symptomatic bradycardia 03/04/2014  . Thyroid disease   . Type II diabetes mellitus (HCC)    Novolog,Levemir,and Lovaza daily    Family History  Problem Relation Age of Onset  . Hypertension Mother   . Hyperlipidemia Mother   . Hypertrophic cardiomyopathy Mother     has pacer  . Alzheimer's disease Father   . Hypertension Brother   .  Hyperlipidemia Brother   . Cancer Maternal Aunt   . Diabetes Paternal Aunt   . Diabetes Paternal Uncle   . Diabetes Paternal Grandmother   . Anesthesia problems Neg Hx   . Hypotension Neg Hx   . Malignant hyperthermia Neg Hx   . Pseudochol deficiency Neg Hx     Past Surgical History:  Procedure Laterality Date  . AMPUTATION    . BLADDER SURGERY  1970's   "stretched the mouth of my bladder" (04/21/2013)  . DILATION AND CURETTAGE OF UTERUS  1980's  . FOOT SURGERY Right 2012   "put pins in one year; amputated toes another OR" (04/21/2013)  . GANGLION CYST EXCISION Left 1990's   "wrist" (04/21/2013)  . I&D EXTREMITY Left 08/25/2013   Procedure: IRRIGATION AND DEBRIDEMENT EXTREMITY;  Surgeon: Axel Filler, MD;  Location: [facility] OR;  Service: General;  Laterality: Left;  . INCISION AND DRAINAGE ABSCESS N/A 01/24/2013   Procedure: INCISION AND DRAINAGE ABSCESS;  Surgeon: Clovis Pu. Cornett, MD;  Location: [facility] OR;  Service: General;  Laterality: N/A;  . INGUINAL HERNIA REPAIR N/A 01/28/2013   Procedure: I&D Right  Groin Wound;  Surgeon: Shelly Rubenstein, MD;  Location: [facility] OR;  Service: General;  Laterality: N/A;  . LEG AMPUTATION BELOW KNEE Right 2012  . PACEMAKER INSERTION  03/05/14   Biotronik dual chamber pacemaker implanted by Dr Graciela Husbands for symptomatic bradycardia  . PERMANENT PACEMAKER INSERTION N/A 03/05/2014   Procedure: PERMANENT PACEMAKER INSERTION;  Surgeon: Duke Salvia, MD;  [facility] C[facility] LAB;  Service: Cardiovascular;  Laterality: N/A;  . REFRACTIVE SURGERY Bilateral 2014  . STUMP REVISION Right 12/11/2014   Procedure: Right Below Knee Amputation Revision;  Surgeon: Nadara Mustard, MD;  Location: [facility] OR;  Service: Orthopedics;  Laterality: Right;  . TOE AMPUTATION Left ?2011    only 2 toes remaning.   . TUBAL LIGATION  1990's  . WRIST SURGERY Right 1980's   "pinched nerve" (04/21/2013)   Social History   Occupational History  . Not on file.   Social History Main Topics  . Smoking status: Current Every Day Smoker    Packs/day: 0.50    Years: 33.00    Types: Cigarettes  . Smokeless tobacco: Never Used  . Alcohol use No  . Drug use: No  . Sexual activity: No

## 2016-12-29 ENCOUNTER — Ambulatory Visit (INDEPENDENT_AMBULATORY_CARE_PROVIDER_SITE_OTHER): Payer: Medicare HMO | Admitting: Family

## 2016-12-29 DIAGNOSIS — E11621 Type 2 diabetes mellitus with foot ulcer: Secondary | ICD-10-CM | POA: Diagnosis not present

## 2016-12-29 DIAGNOSIS — S81001D Unspecified open wound, right knee, subsequent encounter: Secondary | ICD-10-CM

## 2016-12-29 DIAGNOSIS — S81801D Unspecified open wound, right lower leg, subsequent encounter: Secondary | ICD-10-CM | POA: Diagnosis not present

## 2016-12-29 DIAGNOSIS — Z794 Long term (current) use of insulin: Secondary | ICD-10-CM | POA: Diagnosis not present

## 2016-12-29 DIAGNOSIS — L97421 Non-pressure chronic ulcer of left heel and midfoot limited to breakdown of skin: Secondary | ICD-10-CM

## 2016-12-29 DIAGNOSIS — S91001D Unspecified open wound, right ankle, subsequent encounter: Secondary | ICD-10-CM

## 2016-12-29 DIAGNOSIS — E1165 Type 2 diabetes mellitus with hyperglycemia: Secondary | ICD-10-CM

## 2016-12-29 DIAGNOSIS — E118 Type 2 diabetes mellitus with unspecified complications: Secondary | ICD-10-CM

## 2016-12-29 DIAGNOSIS — Z89511 Acquired absence of right leg below knee: Secondary | ICD-10-CM

## 2016-12-29 DIAGNOSIS — IMO0002 Reserved for concepts with insufficient information to code with codable children: Secondary | ICD-10-CM

## 2016-12-29 MED ORDER — SULFAMETHOXAZOLE-TRIMETHOPRIM 800-160 MG PO TABS: 1.0000 | ORAL_TABLET | Freq: Two times a day (BID) | ORAL | 0 refills | Status: DC

## 2016-12-29 NOTE — Progress Notes (Signed)
Office Visit Note   Patient: [name]           Date of Birth: 1960/09/12           MRN: [id number] Visit Date: 12/29/2016 Requested by: Loyal Jacobson, MD [address] [place], [place] [address] PCP: Loyal Jacobson, MD  Subjective: No chief complaint on file.   Patient is a 56 year old woman seen in follow up for ulceration left foot. This has been ongoing for years. Ambulatory in a wheechair. Does have new ED shoes left foot with a new custom orthotic on the left. Is doing daily wound care and dressing changes.   Right below knee amputation has continued ulceration to distal patella from prosthesis. States the socket is to large, had not been wearing extra ply which caused her to develop the ulcer due to rubbing and movement in prosethesis. She has been applying silvadene. States it is not getting better. Is concerned for increased tenderness to wound.  Has been to [facility] who did not make any modifications to prosthesis. Did advise she stop wearing sleeve and prosthesis until healed.                Review of Systems  Constitutional: Negative for chills and fever.  Cardiovascular: Negative for leg swelling.  Skin: Positive for wound. Negative for color change.     Assessment & Plan: Visit Diagnoses:  1. Diabetic ulcer of left midfoot associated with type 2 diabetes mellitus, limited to breakdown of skin (HCC)   2. Uncontrolled type 2 diabetes mellitus with complication, with long-term current use of insulin (HCC)   3. Open wound of right knee, leg, and ankle with complication, subsequent encounter   4. Acquired absence of right leg below knee (HCC)     Plan: Left foot ulcer debridement of skin and soft tissue no evidence of infection or cellulitis the ulcer does not probe to bone plan to follow-up in the office in 4 weeks. continue with protective shoe wear continue with a wheelchair for ambulation. Remain out of her prosthesis on the right. Apply  Band-Aid with mupirocin or Silvadene daily.   Follow-Up Instructions: Return in about 4 weeks (around 01/26/2017).   Orders:  No orders of the defined types were placed in this encounter.  No orders of the defined types were placed in this encounter.     Procedures: No procedures performed   Clinical Data: No additional findings.  Objective: Vital Signs: There were no vitals taken for this visit.  Physical Exam on examination patient is alert oriented poor hygiene. 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 There is a Wagner grade 1 ulcer beneath the rocker-bottom deformity. After informed consent a 10 blade knife was used to debride the skin and soft tissue back to bleeding viable granulation tissue. This was 100% granulation tissue and did not probe to bone or tendon. There is surrounding maceration callus which was pared. The ulcer measures 15 mm in diameter. Is 2 mm deep. Iodosorb and a Band-Aid was applied. There is no cellulitis. The right knee ulceration to the distal patella is 1 cm in diameter. Is. Covered with eschar. No surrounding erythema. No drainage. No cellulitis.  Ortho Exam  Specialty Comments:  No specialty comments available.  Imaging: No results found.   PMFS History: Patient Active Problem  List   Diagnosis Date Noted  . Acquired absence of right leg below knee (HCC) 11/29/2016  . Non-pressure chronic ulcer of other part of left foot limited to breakdown of skin (HCC) 10/10/2016  . Left facial numbness   . Cerebrovascular disease   . Diabetic polyneuropathy associated with type 2 diabetes mellitus (HCC)   . Chronic nonintractable headache   . GERD (gastroesophageal reflux disease) 07/21/2016  . TIA (transient ischemic attack) 07/04/2016  . Hyperkalemia 12/07/2015  .  Herpes simplex labialis   . Altered mental status   . Thrush 10/20/2015  . HSV-1 (herpes simplex virus 1) infection   . Hallucinations   . Acute respiratory failure with hypoxia (HCC) 10/14/2015  . Hyperglycemia 10/13/2015  . Diabetes mellitus due to underlying condition with hyperosmolarity without nonketotic hyperglycemic-hyperosmolar coma [facility]) (HCC) 10/13/2015  . CKD (chronic kidney disease), stage III 10/13/2015  . AKI (acute kidney injury) (HCC) 10/13/2015  . Diastolic CHF (HCC) 10/13/2015  . Acute encephalopathy 10/13/2015  . 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 Lactic acid acidosis   . Diabetic foot ulcer (HCC) 12/08/2014  . Symptomatic bradycardia 03/04/2014  . OSA on CPAP with oxygen 03/04/2014  . COPD (chronic obstructive pulmonary disease), on home O2 03/04/2014  . Bradycardia 03/04/2014  . Open wound of knee, leg (except thigh), and ankle, complicated 09/17/2013  . Cellulitis 08/16/2013  . Chronic diastolic heart failure (HCC) 08/16/2013  . Sepsis (HCC) 08/16/2013  . SOB (shortness of breath) 04/20/2013  . Proteinuria 01/26/2013  . Acute on chronic kidney failure (HCC) 01/24/2013  . Abscess of groin, right 01/23/2013  . HTN (hypertension) 01/23/2013  . Leukocytosis 01/23/2013  . Anemia of chronic disease 01/23/2013  . Hypothyroidism 01/23/2013  . Hyponatremia 10/06/2011  . HYPERCHOLESTEROLEMIA 08/17/2010  . Depression 02/01/2010  . TOBACCO ABUSE 06/04/2009  . Diabetes mellitus type 2, uncontrolled, with complications (HCC) 08/06/2007   Past Medical History:  Diagnosis Date  . Anxiety   . Cellulitis   . CHF (congestive heart failure) (HCC)   . Chronic bronchitis (HCC)   . Complication of anesthesia 01/2013   "didn't know where I was; who I was; talking out the top of my head" (May 18, 2013)  . Concussion as a teenager   slight  . COPD (chronic obstructive pulmonary disease) (HCC)    wear oxygen at home  . Daily headache   . Depression    takes  CYmbalta daily  . Diabetic neuropathy (HCC)   . Diarrhea    pt takes Reglan tid  . GERD (gastroesophageal reflux disease)   . Hyperlipidemia   . Hypertension   . Hypothyroidism    takes Synthroid daily  . Kidney stone    "passed on before" (05-18-13)  . MRSA (methicillin resistant staph aureus) culture positive    hx of 2012  . Neuromuscular disorder (HCC)   . Non-healing wound of lower extremity    lt foot  . Orthopnea    "just one; yesterday morning" (05-18-2013)  . OSA on CPAP    and oxygen  . Peripheral vascular disease (HCC)   . Pneumonia 1980's   "hospitalized w/it" (May 18, 2013)  . Rheumatoid arthritis (HCC)    "both knees" (05-18-2013)  . Staph infection 2007  . Symptomatic bradycardia 03/04/2014  . Thyroid disease   . Type II diabetes mellitus (HCC)    Novolog,Levemir,and Lovaza daily    Family History  Problem Relation Age of Onset  . Hypertension Mother   . Hyperlipidemia Mother   . Hypertrophic  cardiomyopathy Mother        has pacer  . Alzheimer's disease Father   . Hypertension Brother   . Hyperlipidemia Brother   . Cancer Maternal Aunt   . Diabetes Paternal Aunt   . Diabetes Paternal Uncle   . Diabetes Paternal Grandmother   . Anesthesia problems Neg Hx   . Hypotension Neg Hx   . Malignant hyperthermia Neg Hx   . Pseudochol deficiency Neg Hx     Past Surgical History:  Procedure Laterality Date  . AMPUTATION    . BLADDER SURGERY  1970's   "stretched the mouth of my bladder" (04/21/2013)  . DILATION AND CURETTAGE OF UTERUS  1980's  . FOOT SURGERY Right 2012   "put pins in one year; amputated toes another OR" (04/21/2013)  . GANGLION CYST EXCISION Left 1990's   "wrist" (04/21/2013)  . I&D EXTREMITY Left 08/25/2013   Procedure: IRRIGATION AND DEBRIDEMENT EXTREMITY;  Surgeon: Axel Filler, MD;  Location: [facility] OR;  Service: General;  Laterality: Left;  . INCISION AND DRAINAGE ABSCESS N/A 01/24/2013   Procedure: INCISION AND DRAINAGE ABSCESS;  Surgeon: Clovis Pu.  Cornett, MD;  Location: [facility] OR;  Service: General;  Laterality: N/A;  . INGUINAL HERNIA REPAIR N/A 01/28/2013   Procedure: I&D Right Groin Wound;  Surgeon: Shelly Rubenstein, MD;  Location: [facility] OR;  Service: General;  Laterality: N/A;  . LEG AMPUTATION BELOW KNEE Right 2012  . PACEMAKER INSERTION  03/05/14   Biotronik dual chamber pacemaker implanted by Dr Graciela Husbands for symptomatic bradycardia  . PERMANENT PACEMAKER INSERTION N/A 03/05/2014   Procedure: PERMANENT PACEMAKER INSERTION;  Surgeon: Duke Salvia, MD;  Location: [facility] CATH LAB;  Service: Cardiovascular;  Laterality: N/A;  . REFRACTIVE SURGERY Bilateral 2014  . STUMP REVISION Right 12/11/2014   Procedure: Right Below Knee Amputation Revision;  Surgeon: Nadara Mustard, MD;  Location: [facility] OR;  Service: Orthopedics;  Laterality: Right;  . TOE AMPUTATION Left ?2011    only 2 toes remaning.   . TUBAL LIGATION  1990's  . WRIST SURGERY Right 1980's   "pinched nerve" (04/21/2013)   Social History   Occupational History  . Not on file.   Social History Main Topics  . Smoking status: Current Every Day Smoker    Packs/day: 0.50    Years: 33.00    Types: Cigarettes  . Smokeless tobacco: Never Used  . Alcohol use No  . Drug use: No  . Sexual activity: No

## 2017-01-29 ENCOUNTER — Ambulatory Visit (INDEPENDENT_AMBULATORY_CARE_PROVIDER_SITE_OTHER): Payer: Medicare HMO | Admitting: Orthopedic Surgery

## 2017-01-29 DIAGNOSIS — L97521 Non-pressure chronic ulcer of other part of left foot limited to breakdown of skin: Secondary | ICD-10-CM

## 2017-01-29 DIAGNOSIS — E1142 Type 2 diabetes mellitus with diabetic polyneuropathy: Secondary | ICD-10-CM | POA: Diagnosis not present

## 2017-01-29 NOTE — Progress Notes (Signed)
Office Visit Note   Patient: [name]           Date of Birth: 1961-08-06           MRN: [id number] Visit Date: 01/29/2017 Requested by: Loyal Jacobson, MD [address] [place], [place] [address] PCP: Loyal Jacobson, MD  Subjective: No chief complaint on file.   Patient is a 56 year old woman seen in follow up for chronic ulceration left foot. This has been ongoing for years. Ambulatory in a wheechair. Does have new ED shoes left foot with a new custom orthotic on the left. Is doing daily wound care and dressing changes. States has been up on her feet more this last month.   Has appointment tomorrow with [employer] for modifications to right prosthesis.                Review of Systems  Constitutional: Negative for chills and fever.  Cardiovascular: Negative for leg swelling.  Skin: Positive for wound. Negative for color change.     Assessment & Plan: Visit Diagnoses:  1. Non-pressure chronic ulcer of other part of left foot limited to breakdown of skin (HCC)   2. Diabetic polyneuropathy associated with type 2 diabetes mellitus (HCC)     Plan: Left foot ulcer debridement of skin and soft tissue no evidence of infection or cellulitis the ulcer does not probe to bone plan to follow-up in the office in 4 weeks. continue with protective shoe wear continue with a wheelchair for ambulation. Remain out of her prosthesis on the right. Apply Band-Aid with mupirocin or Silvadene daily.   Follow-Up Instructions: Return in about 4 weeks (around 02/26/2017).   Orders:  No orders of the defined types were placed in this encounter.  No orders of the defined types were placed in this encounter.     Procedures: No procedures performed   Clinical Data: No additional findings.  Objective: Vital Signs: There were no vitals taken for this visit.  Physical Exam on examination patient is alert oriented poor hygiene. 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 There is a Wagner grade 1 ulcer beneath the rocker-bottom deformity. After informed consent a 10 blade knife was used to debride the skin and soft tissue back to bleeding viable granulation tissue. This was 100% granulation tissue and did not probe to bone or tendon. There is surrounding maceration callus which was pared. The ulcer measures 10 mm in diameter. Is 2 mm deep. Iodosorb and a Band-Aid was applied. There is no cellulitis.   Ortho Exam  Specialty Comments:  No specialty comments available.  Imaging: No results found.   PMFS History: Patient Active Problem List   Diagnosis Date Noted  . Acquired absence of right leg below knee (HCC) 11/29/2016  . Non-pressure chronic ulcer of other part of left foot limited to breakdown of skin (HCC) 10/10/2016  . Left facial numbness   . Cerebrovascular disease   . Diabetic polyneuropathy associated with type 2 diabetes mellitus (HCC)   . Chronic nonintractable headache   . GERD (gastroesophageal reflux disease) 07/21/2016  . TIA (transient ischemic attack) 07/04/2016  . Hyperkalemia 12/07/2015  . Herpes simplex labialis   . Altered mental status   . Thrush 10/20/2015  . HSV-1 (herpes simplex virus 1) infection   . Hallucinations   .  Acute respiratory failure with hypoxia (HCC) 10/14/2015  . Hyperglycemia 10/13/2015  . Diabetes mellitus due to underlying condition with hyperosmolarity without nonketotic hyperglycemic-hyperosmolar coma [facility]) (HCC) 10/13/2015  . CKD (chronic kidney disease), stage III 10/13/2015  . AKI (acute kidney injury) (HCC) 10/13/2015  . Diastolic CHF (HCC) 10/13/2015  . Acute encephalopathy 10/13/2015  . 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 Lactic acid acidosis   . Diabetic foot ulcer (HCC) 12/08/2014  . Symptomatic  bradycardia 03/04/2014  . OSA on CPAP with oxygen 03/04/2014  . COPD (chronic obstructive pulmonary disease), on home O2 03/04/2014  . Bradycardia 03/04/2014  . Open wound of knee, leg (except thigh), and ankle, complicated 09/17/2013  . Cellulitis 08/16/2013  . Chronic diastolic heart failure (HCC) 08/16/2013  . Sepsis (HCC) 08/16/2013  . SOB (shortness of breath) 04/20/2013  . Proteinuria 01/26/2013  . Acute on chronic kidney failure (HCC) 01/24/2013  . Abscess of groin, right 01/23/2013  . HTN (hypertension) 01/23/2013  . Leukocytosis 01/23/2013  . Anemia of chronic disease 01/23/2013  . Hypothyroidism 01/23/2013  . Hyponatremia 10/06/2011  . HYPERCHOLESTEROLEMIA 08/17/2010  . Depression 02/01/2010  . TOBACCO ABUSE 06/04/2009  . Diabetes mellitus type 2, uncontrolled, with complications (HCC) 08/06/2007   Past Medical History:  Diagnosis Date  . Anxiety   . Cellulitis   . CHF (congestive heart failure) (HCC)   . Chronic bronchitis (HCC)   . Complication of anesthesia 01/2013   "didn't know where I was; who I was; talking out the top of my head" (April 25, 2013)  . Concussion as a teenager   slight  . COPD (chronic obstructive pulmonary disease) (HCC)    wear oxygen at home  . Daily headache   . Depression    takes CYmbalta daily  . Diabetic neuropathy (HCC)   . Diarrhea    pt takes Reglan tid  . GERD (gastroesophageal reflux disease)   . Hyperlipidemia   . Hypertension   . Hypothyroidism    takes Synthroid daily  . Kidney stone    "passed on before" (April 25, 2013)  . MRSA (methicillin resistant staph aureus) culture positive    hx of 2012  . Neuromuscular disorder (HCC)   . Non-healing wound of lower extremity    lt foot  . Orthopnea    "just one; yesterday morning" (04-25-13)  . OSA on CPAP    and oxygen  . Peripheral vascular disease (HCC)   . Pneumonia 1980's   "hospitalized w/it" (April 25, 2013)  . Rheumatoid arthritis (HCC)    "both knees" (04-25-2013)  . Staph  infection 2007  . Symptomatic bradycardia 03/04/2014  . Thyroid disease   . Type II diabetes mellitus (HCC)    Novolog,Levemir,and Lovaza daily    Family History  Problem Relation Age of Onset  . Hypertension Mother   . Hyperlipidemia Mother   . Hypertrophic cardiomyopathy Mother        has pacer  . Alzheimer's disease Father   . Hypertension Brother   . Hyperlipidemia Brother   . Cancer Maternal Aunt   . Diabetes Paternal Aunt   . Diabetes Paternal Uncle   . Diabetes Paternal Grandmother   . Anesthesia problems Neg Hx   . Hypotension Neg Hx   . Malignant hyperthermia Neg Hx   . Pseudochol deficiency Neg Hx     Past Surgical History:  Procedure Laterality Date  . AMPUTATION    . BLADDER SURGERY  1970's   "stretched the mouth of my bladder" (04-25-13)  . DILATION AND CURETTAGE  OF UTERUS  1980's  . FOOT SURGERY Right 2012   "put pins in one year; amputated toes another OR" (04/21/2013)  . GANGLION CYST EXCISION Left 1990's   "wrist" (04/21/2013)  . I&D EXTREMITY Left 08/25/2013   Procedure: IRRIGATION AND DEBRIDEMENT EXTREMITY;  Surgeon: Axel Filler, MD;  Location: [facility] OR;  Service: General;  Laterality: Left;  . INCISION AND DRAINAGE ABSCESS N/A 01/24/2013   Procedure: INCISION AND DRAINAGE ABSCESS;  Surgeon: Clovis Pu. Cornett, MD;  Location: [facility] OR;  Service: General;  Laterality: N/A;  . INGUINAL HERNIA REPAIR N/A 01/28/2013   Procedure: I&D Right Groin Wound;  Surgeon: Shelly Rubenstein, MD;  Location: [facility] OR;  Service: General;  Laterality: N/A;  . LEG AMPUTATION BELOW KNEE Right 2012  . PACEMAKER INSERTION  03/05/14   Biotronik dual chamber pacemaker implanted by Dr Graciela Husbands for symptomatic bradycardia  . PERMANENT PACEMAKER INSERTION N/A 03/05/2014   Procedure: PERMANENT PACEMAKER INSERTION;  Surgeon: Duke Salvia, MD;  [facility];  Service: Cardiovascular;  Laterality: N/A;  . REFRACTIVE SURGERY Bilateral 2014  . STUMP REVISION Right 12/11/2014   Procedure: Right  Below Knee Amputation Revision;  Surgeon: Nadara Mustard, MD;  Location: [facility];  Service: Orthopedics;  Laterality: Right;  . TOE AMPUTATION Left ?2011    only 2 toes remaning.   . TUBAL LIGATION  1990's  . WRIST SURGERY Right 1980's   "pinched nerve" (04/21/2013)   Social History   Occupational History  . Not on file.   Social History Main Topics  . Smoking status: Current Every Day Smoker    Packs/day: 0.50    Years: 33.00    Types: Cigarettes  . Smokeless tobacco: Never Used  . Alcohol use No  . Drug use: No  . Sexual activity: No

## 2017-02-26 ENCOUNTER — Ambulatory Visit (INDEPENDENT_AMBULATORY_CARE_PROVIDER_SITE_OTHER): Payer: Medicare HMO | Admitting: Orthopedic Surgery

## 2017-02-26 ENCOUNTER — Encounter (INDEPENDENT_AMBULATORY_CARE_PROVIDER_SITE_OTHER): Payer: Self-pay | Admitting: Orthopedic Surgery

## 2017-02-26 VITALS — Ht 65.0 in | Wt 216.0 lb

## 2017-02-26 DIAGNOSIS — Z794 Long term (current) use of insulin: Secondary | ICD-10-CM

## 2017-02-26 DIAGNOSIS — L97521 Non-pressure chronic ulcer of other part of left foot limited to breakdown of skin: Secondary | ICD-10-CM

## 2017-02-26 DIAGNOSIS — E1165 Type 2 diabetes mellitus with hyperglycemia: Secondary | ICD-10-CM | POA: Diagnosis not present

## 2017-02-26 DIAGNOSIS — IMO0002 Reserved for concepts with insufficient information to code with codable children: Secondary | ICD-10-CM

## 2017-02-26 DIAGNOSIS — E118 Type 2 diabetes mellitus with unspecified complications: Secondary | ICD-10-CM | POA: Diagnosis not present

## 2017-02-26 DIAGNOSIS — Z89511 Acquired absence of right leg below knee: Secondary | ICD-10-CM | POA: Diagnosis not present

## 2017-02-26 NOTE — Progress Notes (Signed)
Office Visit Note   Patient: [name]           Date of Birth: 10/20/1960           MRN: [id number] Visit Date: 02/26/2017              Requested by: Loyal Jacobson, MD [address] [place], [place] [address] PCP: Loyal Jacobson, MD  Chief Complaint  Patient presents with  . Right Leg - Follow-up    Right BKA anterior and posterior ulceration  . Left Foot - Follow-up    Medial plantar ulceration.      HPI: Patient is a 56 year old woman diabetic insensate neuropathy transtibial imitation the right and Charcot rocker-bottom deformity of the left. She has new orthotics in the shoe for the left foot she has a new socket that is about 7 weeks old for the right leg she has not been wearing her prosthesis for ambulation she currently rates her wheelchair.  Assessment & Plan: Visit Diagnoses:  1. Non-pressure chronic ulcer of other part of left foot limited to breakdown of skin (HCC)   2. Uncontrolled type 2 diabetes mellitus with complication, with long-term current use of insulin (HCC)   3. Acquired absence of right leg below knee (HCC)     Plan: Ulcer debridement of skin and soft tissue left foot recommend she use antibiotic ointment plus a Band-Aid for right transtibial amputation ulcer over the inferior pole of the patella. Continue not using her prosthesis continue with the wheelchair for ambulation follow-up in 3 weeks  Follow-Up Instructions: Return in about 3 weeks (around 03/19/2017).   Ortho Exam  Patient is alert, oriented, no adenopathy, well-dressed, normal affect, normal respiratory effort. Examination patient relates a wheelchair. Examination the left foot she has a large Wagner grade 1 ulcer beneath the Charcot rocker-bottom deformity left foot she has good pulses there is no redness no cellulitis and purulent drainage there is no exposed bone or tendon. After informed consent a 10 blade knife was used to debride the skin and soft tissue back to  healthy viable granulation tissue this was touched with silver nitrate the ulcer is 2 cm in diameter 1 mm deep. Examination the right lower extremity she has a 10 mm ulcer over the inferior pole the patella from her limb subsiding into the socket she also has a posterior popliteal ulcer also about a 10 mm in diameter secondary to her limb subsiding the socket. This should resolve now with a new socket. Recommend a Band-Aid and antibiotic ointment the wounds have no cellulitis no tenderness no purulence no signs of infection.  Imaging: No results found.  Labs: Lab Results  Component Value Date   HGBA1C 12.6 (H) 07/05/2016   HGBA1C 10.0 (H) 12/05/2015   HGBA1C 11.9 (H) 10/20/2015   ESRSEDRATE >140 (H) 12/05/2015   ESRSEDRATE 119 (H) 10/21/2015   ESRSEDRATE 70 (H) 12/07/2014   CRP 22.8 (H) 12/05/2015   CRP 3.4 (H) 10/21/2015   CRP 27.6 (H) 12/07/2014   REPTSTATUS 12/09/2015 FINAL 12/04/2015   GRAMSTAIN  10/22/2015    CYTOSPIN SMEAR WBC PRESENT, PREDOMINANTLY MONONUCLEAR NO ORGANISMS SEEN    CULT NO GROWTH 5 DAYS 12/04/2015   LABORGA ESCHERICHIA COLI 10/01/2015    Orders:  No orders of the defined types were placed in this encounter.  No orders of the defined types were placed in this encounter.    Procedures: No procedures performed  Clinical Data: No additional findings.  ROS:  All other systems negative, except as noted in the HPI. Review of Systems  Objective: Vital Signs: Ht 5\' 5"  (1.651 m)   Wt 216 lb (98 kg)   BMI 35.94 kg/m   Specialty Comments:  No specialty comments available.  PMFS History: Patient Active Problem List   Diagnosis Date Noted  . Acquired absence of right leg below knee (HCC) 11/29/2016  . Non-pressure chronic ulcer of other part of left foot limited to breakdown of skin (HCC) 10/10/2016  . Left facial numbness   . Cerebrovascular disease   . Diabetic polyneuropathy associated with type 2 diabetes mellitus (HCC)   . Chronic  nonintractable headache   . GERD (gastroesophageal reflux disease) 07/21/2016  . TIA (transient ischemic attack) 07/04/2016  . Hyperkalemia 12/07/2015  . Herpes simplex labialis   . Altered mental status   . Thrush 10/20/2015  . HSV-1 (herpes simplex virus 1) infection   . Hallucinations   . Acute respiratory failure with hypoxia (HCC) 10/14/2015  . Hyperglycemia 10/13/2015  . Diabetes mellitus due to underlying condition with hyperosmolarity without nonketotic hyperglycemic-hyperosmolar coma [facility]) (HCC) 10/13/2015  . CKD (chronic kidney disease), stage III 10/13/2015  . AKI (acute kidney injury) (HCC) 10/13/2015  . Diastolic CHF (HCC) 10/13/2015  . Acute encephalopathy 10/13/2015  . 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 Lactic acid acidosis   . Diabetic foot ulcer (HCC) 12/08/2014  . Symptomatic bradycardia 03/04/2014  . OSA on CPAP with oxygen 03/04/2014  . COPD (chronic obstructive pulmonary disease), on home O2 03/04/2014  . Bradycardia 03/04/2014  . Open wound of knee, leg (except thigh), and ankle, complicated 09/17/2013  . Cellulitis 08/16/2013  . Chronic diastolic heart failure (HCC) 08/16/2013  . Sepsis (HCC) 08/16/2013  . SOB (shortness of breath) 04/20/2013  . Proteinuria 01/26/2013  . Acute on chronic kidney failure (HCC) 01/24/2013  . Abscess of groin, right 01/23/2013  . HTN (hypertension) 01/23/2013  . Leukocytosis 01/23/2013  . Anemia of chronic disease 01/23/2013  . Hypothyroidism 01/23/2013  . Hyponatremia 10/06/2011  . HYPERCHOLESTEROLEMIA 08/17/2010  . Depression 02/01/2010  . TOBACCO ABUSE 06/04/2009  . Diabetes mellitus type 2, uncontrolled, with complications (HCC) 08/06/2007   Past Medical History:  Diagnosis Date  . Anxiety   . Cellulitis   . CHF (congestive heart failure) (HCC)   . Chronic bronchitis (HCC)   . Complication of anesthesia 01/2013   "didn't know where I was; who I was; talking out the top of my head" (05-10-13)  .  Concussion as a teenager   slight  . COPD (chronic obstructive pulmonary disease) (HCC)    wear oxygen at home  . Daily headache   . Depression    takes CYmbalta daily  . Diabetic neuropathy (HCC)   . Diarrhea    pt takes Reglan tid  . GERD (gastroesophageal reflux disease)   . Hyperlipidemia   . Hypertension   . Hypothyroidism    takes Synthroid daily  . Kidney stone    "passed on before" (05/10/13)  . MRSA (methicillin resistant staph aureus) culture positive    hx of 2012  . Neuromuscular disorder (HCC)   . Non-healing wound of lower extremity    lt foot  . Orthopnea    "just one; yesterday morning" (10-May-2013)  . OSA on CPAP    and oxygen  . Peripheral vascular disease (HCC)   . Pneumonia 1980's   "hospitalized w/it" (05/10/13)  . Rheumatoid arthritis (HCC)    "both knees" (05/10/13)  . Staph  infection 2007  . Symptomatic bradycardia 03/04/2014  . Thyroid disease   . Type II diabetes mellitus (HCC)    Novolog,Levemir,and Lovaza daily    Family History  Problem Relation Age of Onset  . Hypertension Mother   . Hyperlipidemia Mother   . Hypertrophic cardiomyopathy Mother        has pacer  . Alzheimer's disease Father   . Hypertension Brother   . Hyperlipidemia Brother   . Cancer Maternal Aunt   . Diabetes Paternal Aunt   . Diabetes Paternal Uncle   . Diabetes Paternal Grandmother   . Anesthesia problems Neg Hx   . Hypotension Neg Hx   . Malignant hyperthermia Neg Hx   . Pseudochol deficiency Neg Hx     Past Surgical History:  Procedure Laterality Date  . AMPUTATION    . BLADDER SURGERY  1970's   "stretched the mouth of my bladder" (04/21/2013)  . DILATION AND CURETTAGE OF UTERUS  1980's  . FOOT SURGERY Right 2012   "put pins in one year; amputated toes another OR" (04/21/2013)  . GANGLION CYST EXCISION Left 1990's   "wrist" (04/21/2013)  . I&D EXTREMITY Left 08/25/2013   Procedure: IRRIGATION AND DEBRIDEMENT EXTREMITY;  Surgeon: Axel Filler, MD;  Location:  [facility] OR;  Service: General;  Laterality: Left;  . INCISION AND DRAINAGE ABSCESS N/A 01/24/2013   Procedure: INCISION AND DRAINAGE ABSCESS;  Surgeon: Clovis Pu. Cornett, MD;  Location: [facility] OR;  Service: General;  Laterality: N/A;  . INGUINAL HERNIA REPAIR N/A 01/28/2013   Procedure: I&D Right Groin Wound;  Surgeon: Shelly Rubenstein, MD;  Location: [facility] OR;  Service: General;  Laterality: N/A;  . LEG AMPUTATION BELOW KNEE Right 2012  . PACEMAKER INSERTION  03/05/14   Biotronik dual chamber pacemaker implanted by Dr Graciela Husbands for symptomatic bradycardia  . PERMANENT PACEMAKER INSERTION N/A 03/05/2014   Procedure: PERMANENT PACEMAKER INSERTION;  Surgeon: Duke Salvia, MD;  Location: [facility] CATH LAB;  Service: Cardiovascular;  Laterality: N/A;  . REFRACTIVE SURGERY Bilateral 2014  . STUMP REVISION Right 12/11/2014   Procedure: Right Below Knee Amputation Revision;  Surgeon: Nadara Mustard, MD;  Location: [facility] OR;  Service: Orthopedics;  Laterality: Right;  . TOE AMPUTATION Left ?2011    only 2 toes remaning.   . TUBAL LIGATION  1990's  . WRIST SURGERY Right 1980's   "pinched nerve" (04/21/2013)   Social History   Occupational History  . Not on file.   Social History Main Topics  . Smoking status: Current Every Day Smoker    Packs/day: 0.50    Years: 33.00    Types: Cigarettes  . Smokeless tobacco: Never Used  . Alcohol use No  . Drug use: No  . Sexual activity: No

## 2017-03-19 ENCOUNTER — Encounter (INDEPENDENT_AMBULATORY_CARE_PROVIDER_SITE_OTHER): Payer: Self-pay | Admitting: Orthopedic Surgery

## 2017-03-19 ENCOUNTER — Ambulatory Visit (INDEPENDENT_AMBULATORY_CARE_PROVIDER_SITE_OTHER): Payer: Medicare HMO | Admitting: Orthopedic Surgery

## 2017-03-19 VITALS — Ht 65.0 in | Wt 216.0 lb

## 2017-03-19 DIAGNOSIS — IMO0002 Reserved for concepts with insufficient information to code with codable children: Secondary | ICD-10-CM

## 2017-03-19 DIAGNOSIS — Z89511 Acquired absence of right leg below knee: Secondary | ICD-10-CM

## 2017-03-19 DIAGNOSIS — Z794 Long term (current) use of insulin: Secondary | ICD-10-CM

## 2017-03-19 DIAGNOSIS — L97521 Non-pressure chronic ulcer of other part of left foot limited to breakdown of skin: Secondary | ICD-10-CM | POA: Diagnosis not present

## 2017-03-19 DIAGNOSIS — E1165 Type 2 diabetes mellitus with hyperglycemia: Secondary | ICD-10-CM | POA: Diagnosis not present

## 2017-03-19 DIAGNOSIS — E118 Type 2 diabetes mellitus with unspecified complications: Secondary | ICD-10-CM

## 2017-03-19 NOTE — Progress Notes (Signed)
Office Visit Note   Patient: [name]           Date of Birth: 19-Dec-1960           MRN: [id number] Visit Date: 03/19/2017              Requested by: Loyal Jacobson, MD [address] [place], [place] [address] PCP: Loyal Jacobson, MD  Chief Complaint  Patient presents with  . Right Leg - Follow-up    Below the knee amputation    . 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 Patient presents with new ulcers on the left foot medially as well as ulcers on the right patella and right popliteal fossa.  Assessment & Plan: Visit Diagnoses:  1. Non-pressure chronic ulcer of other part of left foot limited to breakdown of skin (HCC)   2. Uncontrolled type 2 diabetes mellitus with complication, with long-term current use of insulin (HCC)   3. Acquired absence of right leg below knee (HCC)     Plan: Patient will use antibiotic ointment and Band-Aids and not wear her prosthesis on the right. She will use antibiotic ointment and Band-Aids on the left we will place her in a postoperative shoe we will place felt donuts to unload pressure on the left foot ulcers 2.  Follow-Up Instructions: Return in about 2 weeks (around 04/02/2017).   Ortho Exam  Patient is alert, oriented, no adenopathy, well-dressed, normal affect, normal respiratory effort. Patient is ambulating in a wheelchair. Examination of left foot she has a stable Charcot deformity with no signs of acute Charcot process no signs of infection. She has a plantar ulcer which is stable possibly 10 mm in diameter 0.1 mm deep and she has a medial ulcer which is approximately 5 mm in diameter there is redness and good granulation tissue this appears to be more of an abrasion from her shoe wear. Patient has a palpable pulse on the left she does not have  protective sensation. She states she cannot feel the ulcer.  Imaging: No results found.  Labs: Lab Results  Component Value Date   HGBA1C 12.6 (H) 07/05/2016   HGBA1C 10.0 (H) 12/05/2015   HGBA1C 11.9 (H) 10/20/2015   ESRSEDRATE >140 (H) 12/05/2015   ESRSEDRATE 119 (H) 10/21/2015   ESRSEDRATE 70 (H) 12/07/2014   CRP 22.8 (H) 12/05/2015   CRP 3.4 (H) 10/21/2015   CRP 27.6 (H) 12/07/2014   REPTSTATUS 12/09/2015 FINAL 12/04/2015   GRAMSTAIN  10/22/2015    CYTOSPIN SMEAR WBC PRESENT, PREDOMINANTLY MONONUCLEAR NO ORGANISMS SEEN    CULT NO GROWTH 5 DAYS 12/04/2015   LABORGA ESCHERICHIA COLI 10/01/2015    Orders:  No orders of the defined types were placed in this encounter.  No orders of the defined types were placed in this encounter.    Procedures: No procedures performed  Clinical Data: No additional findings.  ROS:  All other systems negative, except as noted in the HPI. Review of Systems  Objective: Vital Signs: Ht 5\' 5"  (1.651 m)   Wt 216 lb (98 kg)   BMI 35.94 kg/m   Specialty Comments:  No specialty comments available.  PMFS History: Patient Active Problem List   Diagnosis Date Noted  . Acquired absence of right leg below knee (HCC) 11/29/2016  .  Non-pressure chronic ulcer of other part of left foot limited to breakdown of skin (HCC) 10/10/2016  . Left facial numbness   . Cerebrovascular disease   . Diabetic polyneuropathy associated with type 2 diabetes mellitus (HCC)   . Chronic nonintractable headache   . GERD (gastroesophageal reflux disease) 07/21/2016  . TIA (transient ischemic attack) 07/04/2016  . Hyperkalemia 12/07/2015  . Herpes simplex labialis   . Altered mental status   . Thrush 10/20/2015  . HSV-1 (herpes simplex virus 1) infection   . Hallucinations   . Acute respiratory failure with hypoxia (HCC) 10/14/2015  . Hyperglycemia 10/13/2015  . Diabetes mellitus due to underlying condition with hyperosmolarity without nonketotic  hyperglycemic-hyperosmolar coma [facility]) (HCC) 10/13/2015  . CKD (chronic kidney disease), stage III 10/13/2015  . AKI (acute kidney injury) (HCC) 10/13/2015  . Diastolic CHF (HCC) 10/13/2015  . Acute encephalopathy 10/13/2015  . 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 Lactic acid acidosis   . Diabetic foot ulcer (HCC) 12/08/2014  . Symptomatic bradycardia 03/04/2014  . OSA on CPAP with oxygen 03/04/2014  . COPD (chronic obstructive pulmonary disease), on home O2 03/04/2014  . Bradycardia 03/04/2014  . Open wound of knee, leg (except thigh), and ankle, complicated 09/17/2013  . Cellulitis 08/16/2013  . Chronic diastolic heart failure (HCC) 08/16/2013  . Sepsis (HCC) 08/16/2013  . SOB (shortness of breath) 04/20/2013  . Proteinuria 01/26/2013  . Acute on chronic kidney failure (HCC) 01/24/2013  . Abscess of groin, right 01/23/2013  . HTN (hypertension) 01/23/2013  . Leukocytosis 01/23/2013  . Anemia of chronic disease 01/23/2013  . Hypothyroidism 01/23/2013  . Hyponatremia 10/06/2011  . HYPERCHOLESTEROLEMIA 08/17/2010  . Depression 02/01/2010  . TOBACCO ABUSE 06/04/2009  . Diabetes mellitus type 2, uncontrolled, with complications (HCC) 08/06/2007   Past Medical History:  Diagnosis Date  . Anxiety   . Cellulitis   . CHF (congestive heart failure) (HCC)   . Chronic bronchitis (HCC)   . Complication of anesthesia 01/2013   "didn't know where I was; who I was; talking out the top of my head" (04/24/13)  . Concussion as a teenager   slight  . COPD (chronic obstructive pulmonary disease) (HCC)    wear oxygen at home  . Daily headache   . Depression    takes CYmbalta daily  . Diabetic neuropathy (HCC)   . Diarrhea    pt takes Reglan tid  . GERD (gastroesophageal reflux disease)   . Hyperlipidemia   . Hypertension   . Hypothyroidism    takes Synthroid daily  . Kidney stone    "passed on before" (2013-04-24)  . MRSA (methicillin resistant staph aureus) culture  positive    hx of 2012  . Neuromuscular disorder (HCC)   . Non-healing wound of lower extremity    lt foot  . Orthopnea    "just one; yesterday morning" (Apr 24, 2013)  . OSA on CPAP    and oxygen  . Peripheral vascular disease (HCC)   . Pneumonia 1980's   "hospitalized w/it" (April 24, 2013)  . Rheumatoid arthritis (HCC)    "both knees" (2013/04/24)  . Staph infection 2007  . Symptomatic bradycardia 03/04/2014  . Thyroid disease   . Type II diabetes mellitus (HCC)    Novolog,Levemir,and Lovaza daily    Family History  Problem Relation Age of Onset  . Hypertension Mother   . Hyperlipidemia Mother   . Hypertrophic cardiomyopathy Mother        has pacer  . Alzheimer's disease Father   .  Hypertension Brother   . Hyperlipidemia Brother   . Cancer Maternal Aunt   . Diabetes Paternal Aunt   . Diabetes Paternal Uncle   . Diabetes Paternal Grandmother   . Anesthesia problems Neg Hx   . Hypotension Neg Hx   . Malignant hyperthermia Neg Hx   . Pseudochol deficiency Neg Hx     Past Surgical History:  Procedure Laterality Date  . AMPUTATION    . BLADDER SURGERY  1970's   "stretched the mouth of my bladder" (04/21/2013)  . DILATION AND CURETTAGE OF UTERUS  1980's  . FOOT SURGERY Right 2012   "put pins in one year; amputated toes another OR" (04/21/2013)  . GANGLION CYST EXCISION Left 1990's   "wrist" (04/21/2013)  . I&D EXTREMITY Left 08/25/2013   Procedure: IRRIGATION AND DEBRIDEMENT EXTREMITY;  Surgeon: Axel Filler, MD;  Location: [facility] OR;  Service: General;  Laterality: Left;  . INCISION AND DRAINAGE ABSCESS N/A 01/24/2013   Procedure: INCISION AND DRAINAGE ABSCESS;  Surgeon: Clovis Pu. Cornett, MD;  Location: [facility] OR;  Service: General;  Laterality: N/A;  . INGUINAL HERNIA REPAIR N/A 01/28/2013   Procedure: I&D Right Groin Wound;  Surgeon: Shelly Rubenstein, MD;  Location: [facility] OR;  Service: General;  Laterality: N/A;  . LEG AMPUTATION BELOW KNEE Right 2012  . PACEMAKER INSERTION  03/05/14    Biotronik dual chamber pacemaker implanted by Dr Graciela Husbands for symptomatic bradycardia  . PERMANENT PACEMAKER INSERTION N/A 03/05/2014   Procedure: PERMANENT PACEMAKER INSERTION;  Surgeon: Duke Salvia, MD;  [facility] CATH LAB;  Service: Cardiovascular;  Laterality: N/A;  . REFRACTIVE SURGERY Bilateral 2014  . STUMP REVISION Right 12/11/2014   Procedure: Right Below Knee Amputation Revision;  Surgeon: Nadara Mustard, MD;  Location: [facility] OR;  Service: Orthopedics;  Laterality: Right;  . TOE AMPUTATION Left ?2011    only 2 toes remaning.   . TUBAL LIGATION  1990's  . WRIST SURGERY Right 1980's   "pinched nerve" (04/21/2013)   Social History   Occupational History  . Not on file.   Social History Main Topics  . Smoking status: Current Every Day Smoker    Packs/day: 0.50    Years: 33.00    Types: Cigarettes  . Smokeless tobacco: Never Used  . Alcohol use No  . Drug use: No  . Sexual activity: No

## 2017-04-02 ENCOUNTER — Ambulatory Visit: Payer: Medicare HMO | Admitting: Nurse Practitioner

## 2017-04-02 ENCOUNTER — Ambulatory Visit (INDEPENDENT_AMBULATORY_CARE_PROVIDER_SITE_OTHER): Payer: Medicare HMO | Admitting: Orthopedic Surgery

## 2017-04-05 ENCOUNTER — Ambulatory Visit (INDEPENDENT_AMBULATORY_CARE_PROVIDER_SITE_OTHER): Payer: Medicare HMO | Admitting: Orthopedic Surgery

## 2017-04-05 ENCOUNTER — Encounter (INDEPENDENT_AMBULATORY_CARE_PROVIDER_SITE_OTHER): Payer: Self-pay | Admitting: Orthopedic Surgery

## 2017-04-05 DIAGNOSIS — S81801D Unspecified open wound, right lower leg, subsequent encounter: Secondary | ICD-10-CM | POA: Diagnosis not present

## 2017-04-05 DIAGNOSIS — S91001D Unspecified open wound, right ankle, subsequent encounter: Secondary | ICD-10-CM

## 2017-04-05 DIAGNOSIS — S81001D Unspecified open wound, right knee, subsequent encounter: Secondary | ICD-10-CM | POA: Diagnosis not present

## 2017-04-05 DIAGNOSIS — Z794 Long term (current) use of insulin: Secondary | ICD-10-CM | POA: Diagnosis not present

## 2017-04-05 DIAGNOSIS — IMO0002 Reserved for concepts with insufficient information to code with codable children: Secondary | ICD-10-CM

## 2017-04-05 DIAGNOSIS — L97521 Non-pressure chronic ulcer of other part of left foot limited to breakdown of skin: Secondary | ICD-10-CM | POA: Diagnosis not present

## 2017-04-05 DIAGNOSIS — E1165 Type 2 diabetes mellitus with hyperglycemia: Secondary | ICD-10-CM

## 2017-04-05 DIAGNOSIS — E118 Type 2 diabetes mellitus with unspecified complications: Secondary | ICD-10-CM | POA: Diagnosis not present

## 2017-04-05 MED ORDER — DOXYCYCLINE HYCLATE 100 MG PO TABS: 100.0000 mg | ORAL_TABLET | Freq: Two times a day (BID) | ORAL | 0 refills | Status: DC

## 2017-04-05 NOTE — Progress Notes (Signed)
Office Visit Note   Patient: [name]           Date of Birth: 1961-01-25           MRN: [id number] Visit Date: 04/05/2017              Requested by: Loyal Jacobson, MD [address] [place], [place] [address] PCP: Loyal Jacobson, MD  Chief Complaint  Patient presents with  . Right Leg - Pain  . 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 Patient was previously placed in a postoperative shoe with a felt leaving dominant. Patient states that the shoe felt to big and she discontinued wearing the shoe and pressure unloading donut. Patient is currently wearing custom extra-depth shoes and not wearing orthotics. Patient also complains of ulcers on the right transtibial amputation she states that these are getting better.  Assessment & Plan: Visit Diagnoses:  1. Non-pressure chronic ulcer of other part of left foot limited to breakdown of skin (HCC)   2. Open wound of right knee, leg, and ankle with complication, subsequent encounter   3. Uncontrolled type 2 diabetes mellitus with complication, with long-term current use of insulin (HCC)     Plan: For the small amount of redness around the ulcer we will start her on doxycycline for 10 days. Continue with antibiotic ointment to the wounds on the right knee and left foot. Minimize weightbearing on both lower extremities minimize use of the prosthesis.  Follow-Up Instructions: Return in about 3 weeks (around 04/26/2017).   Ortho Exam  Patient is alert, oriented, no adenopathy, well-dressed, normal affect, normal respiratory effort. Examination patient ambulates in a wheelchair. She has a stable Charcot rocker-bottom deformity left foot. The ulcers are showing improvement but she does have redness around the dorsal ulcer. The plantar ulcers 5 mm in  diameter 1 mm deep and has good granulation tissue. The more dorsal ulcer is 5 mm in diameter 1 mm deep with some fibrinous exudative tissue dorsally. On the right transtibial amputation she has a healing pressure ulcer over the patella as well as in the popliteal fossa and these appear to be healing well these are both 3 mm in diameter 0.1 mm deep with no signs of infection.  Imaging: No results found. No images are attached to the encounter.  Labs: Lab Results  Component Value Date   HGBA1C 12.6 (H) 07/05/2016   HGBA1C 10.0 (H) 12/05/2015   HGBA1C 11.9 (H) 10/20/2015   ESRSEDRATE >140 (H) 12/05/2015   ESRSEDRATE 119 (H) 10/21/2015   ESRSEDRATE 70 (H) 12/07/2014   CRP 22.8 (H) 12/05/2015   CRP 3.4 (H) 10/21/2015   CRP 27.6 (H) 12/07/2014   REPTSTATUS 12/09/2015 FINAL 12/04/2015   GRAMSTAIN  10/22/2015    CYTOSPIN SMEAR WBC PRESENT, PREDOMINANTLY MONONUCLEAR NO ORGANISMS SEEN    CULT NO GROWTH 5 DAYS 12/04/2015   LABORGA ESCHERICHIA COLI 10/01/2015    Orders:  No orders of the defined types were placed in this encounter.  Meds ordered this encounter  Medications  . doxycycline (VIBRA-TABS) 100 MG tablet    Sig: Take 1 tablet (100 mg total) by mouth 2 (two) times daily.    Dispense:  20 tablet    Refill:  0     Procedures: No procedures performed  Clinical Data:  No additional findings.  ROS:  All other systems negative, except as noted in the HPI. Review of Systems  Objective: Vital Signs: There were no vitals taken for this visit.  Specialty Comments:  No specialty comments available.  PMFS History: Patient Active Problem List   Diagnosis Date Noted  . Acquired absence of right leg below knee (HCC) 11/29/2016  . Non-pressure chronic ulcer of other part of left foot limited to breakdown of skin (HCC) 10/10/2016  . Left facial numbness   . Cerebrovascular disease   . Diabetic polyneuropathy associated with type 2 diabetes mellitus (HCC)   . Chronic  nonintractable headache   . GERD (gastroesophageal reflux disease) 07/21/2016  . TIA (transient ischemic attack) 07/04/2016  . Hyperkalemia 12/07/2015  . Herpes simplex labialis   . Altered mental status   . Thrush 10/20/2015  . HSV-1 (herpes simplex virus 1) infection   . Hallucinations   . Acute respiratory failure with hypoxia (HCC) 10/14/2015  . Hyperglycemia 10/13/2015  . Diabetes mellitus due to underlying condition with hyperosmolarity without nonketotic hyperglycemic-hyperosmolar coma [facility]) (HCC) 10/13/2015  . CKD (chronic kidney disease), stage III 10/13/2015  . AKI (acute kidney injury) (HCC) 10/13/2015  . Diastolic CHF (HCC) 10/13/2015  . Acute encephalopathy 10/13/2015  . 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 Lactic acid acidosis   . Diabetic foot ulcer (HCC) 12/08/2014  . Symptomatic bradycardia 03/04/2014  . OSA on CPAP with oxygen 03/04/2014  . COPD (chronic obstructive pulmonary disease), on home O2 03/04/2014  . Bradycardia 03/04/2014  . Open wound of knee, leg (except thigh), and ankle, complicated 09/17/2013  . Cellulitis 08/16/2013  . Chronic diastolic heart failure (HCC) 08/16/2013  . Sepsis (HCC) 08/16/2013  . SOB (shortness of breath) 04/20/2013  . Proteinuria 01/26/2013  . Acute on chronic kidney failure (HCC) 01/24/2013  . Abscess of groin, right 01/23/2013  . HTN (hypertension) 01/23/2013  . Leukocytosis 01/23/2013  . Anemia of chronic disease 01/23/2013  . Hypothyroidism 01/23/2013  . Hyponatremia 10/06/2011  . HYPERCHOLESTEROLEMIA 08/17/2010  . Depression 02/01/2010  . TOBACCO ABUSE 06/04/2009  . Diabetes mellitus type 2, uncontrolled, with complications (HCC) 08/06/2007   Past Medical History:  Diagnosis Date  . Anxiety   . Cellulitis   . CHF (congestive heart failure) (HCC)   . Chronic bronchitis (HCC)   . Complication of anesthesia 01/2013   "didn't know where I was; who I was; talking out the top of my head" (May 16, 2013)  .  Concussion as a teenager   slight  . COPD (chronic obstructive pulmonary disease) (HCC)    wear oxygen at home  . Daily headache   . Depression    takes CYmbalta daily  . Diabetic neuropathy (HCC)   . Diarrhea    pt takes Reglan tid  . GERD (gastroesophageal reflux disease)   . Hyperlipidemia   . Hypertension   . Hypothyroidism    takes Synthroid daily  . Kidney stone    "passed on before" (05/16/2013)  . MRSA (methicillin resistant staph aureus) culture positive    hx of 2012  . Neuromuscular disorder (HCC)   . Non-healing wound of lower extremity    lt foot  . Orthopnea    "just one; yesterday morning" (05-16-13)  . OSA on CPAP    and oxygen  . Peripheral vascular disease (HCC)   . Pneumonia 1980's   "hospitalized w/it" (2013/05/16)  . Rheumatoid arthritis (HCC)    "both knees" (May 16, 2013)  . Staph infection 2007  .  Symptomatic bradycardia 03/04/2014  . Thyroid disease   . Type II diabetes mellitus (HCC)    Novolog,Levemir,and Lovaza daily    Family History  Problem Relation Age of Onset  . Hypertension Mother   . Hyperlipidemia Mother   . Hypertrophic cardiomyopathy Mother        has pacer  . Alzheimer's disease Father   . Hypertension Brother   . Hyperlipidemia Brother   . Cancer Maternal Aunt   . Diabetes Paternal Aunt   . Diabetes Paternal Uncle   . Diabetes Paternal Grandmother   . Anesthesia problems Neg Hx   . Hypotension Neg Hx   . Malignant hyperthermia Neg Hx   . Pseudochol deficiency Neg Hx     Past Surgical History:  Procedure Laterality Date  . AMPUTATION    . BLADDER SURGERY  1970's   "stretched the mouth of my bladder" (04/21/2013)  . DILATION AND CURETTAGE OF UTERUS  1980's  . FOOT SURGERY Right 2012   "put pins in one year; amputated toes another OR" (04/21/2013)  . GANGLION CYST EXCISION Left 1990's   "wrist" (04/21/2013)  . I&D EXTREMITY Left 08/25/2013   Procedure: IRRIGATION AND DEBRIDEMENT EXTREMITY;  Surgeon: Axel Filler, MD;  Location:  [facility] OR;  Service: General;  Laterality: Left;  . INCISION AND DRAINAGE ABSCESS N/A 01/24/2013   Procedure: INCISION AND DRAINAGE ABSCESS;  Surgeon: Clovis Pu. Cornett, MD;  Location: [facility] OR;  Service: General;  Laterality: N/A;  . INGUINAL HERNIA REPAIR N/A 01/28/2013   Procedure: I&D Right Groin Wound;  Surgeon: Shelly Rubenstein, MD;  Location: [facility] OR;  Service: General;  Laterality: N/A;  . LEG AMPUTATION BELOW KNEE Right 2012  . PACEMAKER INSERTION  03/05/14   Biotronik dual chamber pacemaker implanted by Dr Graciela Husbands for symptomatic bradycardia  . PERMANENT PACEMAKER INSERTION N/A 03/05/2014   Procedure: PERMANENT PACEMAKER INSERTION;  Surgeon: Duke Salvia, MD;  Location: [facility] CATH LAB;  Service: Cardiovascular;  Laterality: N/A;  . REFRACTIVE SURGERY Bilateral 2014  . STUMP REVISION Right 12/11/2014   Procedure: Right Below Knee Amputation Revision;  Surgeon: Nadara Mustard, MD;  Location: [facility] OR;  Service: Orthopedics;  Laterality: Right;  . TOE AMPUTATION Left ?2011    only 2 toes remaning.   . TUBAL LIGATION  1990's  . WRIST SURGERY Right 1980's   "pinched nerve" (04/21/2013)   Social History   Occupational History  . Not on file.   Social History Main Topics  . Smoking status: Current Every Day Smoker    Packs/day: 0.50    Years: 33.00    Types: Cigarettes  . Smokeless tobacco: Never Used  . Alcohol use No  . Drug use: No  . Sexual activity: No

## 2017-04-09 NOTE — Progress Notes (Signed)
[facility]  PATIENT: [name] DOB: 21-Mar-1961   REASON FOR VISIT: Follow-up for TIA HISTORY FROM:patietn    HISTORY OF PRESENT ILLNESS:UPDATE 08/21/2018CM [name], 56 year old female returns for follow-up with hospital admission in November and December TIA. She is currently on Plavix for secondary stroke prevention without further stroke or TIA symptoms. She has minimal bruising no  Bleeding.She remains on Crestor and fenofibrate without complaints of myalgias or other side effects. Diabetes remains in poor control with CBGs ranging between 200-300. 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 She has a nonhealing ulcer on right knee and is  asked to be nonweightbearing for now. He continues to smoke a half  pack cigarettes a day. She has a history of sleep apnea but her machine was taken due to noncompliance. She returns for reevaluation. She lives alone  HISTORY 10/02/16 [name] is a 32 year Caucasian lady seen today for the first office follow-up visit following hospital admission for TIA in December 2017 and November 2017. History is obtained from the patient and review of Hospital medical record. She presented with transient left facial numbness in the setting of mild nonspecific headache which resolved. CT scan of the head done twice showed no acute infarct but old right basal ganglia lacunar infarct. She originally presented on 07/04/2016 for evaluation for TIA with multiple repeated episode of right-sided weakness and slurred speech. CT scan of the head done twice at that time was negative for acute stroke. MRI could not be done due to pacemaker. Transcranial Doppler studies and carotid Doppler showed no significant stenosis. Transthoracic echo showed moderate left ventricular hypertrophy with ejection fraction of 60-70% with calcified mitral annulus. She was discharged on aspirin November and  subsequently switched to Plavix in December. She is tolerating Plavix well without bleeding or bruising. She states her blood pressure is well controlled and today it is 107/51. She states her sugars remain uncontrolled with fasting sugars ranging in the 300 range. She does see endocrinologist at [facility] who is helping her manage her sugars better. She remains on Crestor and fenofibrate and is tolerating it them well without side effects. 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 She has a nonhealing ulcer on left foot and has been asked to be nonweightbearing for now. She has tried cutting back smoking but still smokes half pack per day. She has diagnosis of sleep apnea but is not currently using CPAP. She does see a pulmonologist in [facility]. but has not seen him in recent months.    REVIEW OF SYSTEMS: Full 14 system review of systems performed and notable only for those listed, all others are neg:  Constitutional: neg  Cardiovascular: neg Ear/Nose/Throat: neg  Skin: neg Eyes: neg Respiratory: neg Gastroitestinal: neg  Hematology/Lymphatic: neg  Endocrine: neg Musculoskeletal:neg Allergy/Immunology: neg Neurological: neg Psychiatric: neg Sleep : neg   ALLERGIES: Allergies  Allergen Reactions  . Erythromycin Diarrhea  . Iodine-131 Nausea And Vomiting  . Gadolinium Nausea And Vomiting     Code: VOM, Desc: Pt began vomiting immed post infusion of multihance, Onset Date: [id number]   . Ivp Dye [Iodinated Diagnostic Agents] Nausea And Vomiting       . Metformin Diarrhea  . Penicillins Hives    Has patient had a PCN reaction causing immediate rash, facial/tongue/throat swelling, SOB or lightheadedness with hypotension: No Has patient had a  PCN reaction causing severe rash involving mucus membranes or skin necrosis: No Has patient had a PCN reaction that required hospitalization No Has patient had a PCN reaction occurring  within the last 10 years: No If all of the above answers are "NO", then may proceed with Cephalosporin use.     HOME MEDICATIONS: Outpatient Medications Prior to Visit  Medication Sig Dispense Refill  . amLODipine (NORVASC) 10 MG tablet Take 1 tablet (10 mg total) by mouth daily. 30 tablet 0  . carvedilol (COREG) 3.125 MG tablet Take 1 tablet (3.125 mg total) by mouth 2 (two) times daily with a meal. 30 tablet 0  . clopidogrel (PLAVIX) 75 MG tablet Take 1 tablet (75 mg total) by mouth daily. 30 tablet 0  . doxycycline (VIBRA-TABS) 100 MG tablet Take 1 tablet (100 mg total) by mouth 2 (two) times daily. 20 tablet 0  . EASY COMFORT LANCETS MISC test blood sugar THREE TIMES DAILY  12  . fenofibrate 160 MG tablet Take 1 tablet (160 mg total) by mouth daily. 30 tablet 0  . FLUoxetine (PROZAC) 20 MG tablet Take 60 mg by mouth daily.  0  . furosemide (LASIX) 40 MG tablet Take 0.5 tablets (20 mg total) by mouth daily. 30 tablet 0  . insulin aspart (NOVOLOG FLEXPEN) 100 UNIT/ML FlexPen Inject 0-50 Units into the skin 3 (three) times daily with meals. Per sliding scale    . insulin detemir (LEVEMIR) 100 UNIT/ML injection Inject 0.55 mLs (55 Units total) into the skin 2 (two) times daily. (Patient taking differently: Inject 45 Units into the skin 2 (two) times daily. ) 10 mL 0  . levothyroxine (SYNTHROID, LEVOTHROID) 25 MCG tablet Take 2 tablets (50 mcg total) by mouth every morning. 30 tablet 0  . lisinopril (PRINIVIL,ZESTRIL) 5 MG tablet Take 5 mg by mouth daily.    . meclizine (ANTIVERT) 25 MG tablet Take 1 tablet (25 mg total) by mouth 3 (three) times daily as needed for dizziness. 30 tablet 0  . metoCLOPramide (REGLAN) 10 MG tablet Take 10 mg by mouth 4 (four) times daily. Take 1 tablet (10 mg) by mouth 30 minutes before meals and at bedtime  11  . omeprazole (PRILOSEC) 40 MG capsule Take 40 mg by mouth at bedtime.  3  . oxybutynin (DITROPAN-XL) 5 MG 24 hr tablet Take 10 mg by mouth daily.     .  rosuvastatin (CRESTOR) 20 MG tablet Take 40 mg by mouth daily.   6  . silver sulfADIAZINE (SILVADENE) 1 % cream Apply to wound daily 400 g 0  . sulfamethoxazole-trimethoprim (BACTRIM DS,SEPTRA DS) 800-160 MG tablet Take 1 tablet by mouth 2 (two) times daily. 28 tablet 0   No facility-administered medications prior to visit.     PAST MEDICAL HISTORY: Past Medical History:  Diagnosis Date  . Anxiety   . Cellulitis   . CHF (congestive heart failure) (HCC)   . Chronic bronchitis (HCC)   . Complication of anesthesia 01/2013   "didn't know where I was; who I was; talking out the top of my head" (04/21/2013)  . Concussion as a teenager   slight  . COPD (chronic obstructive pulmonary disease) (HCC)    wear oxygen at home  . Daily headache   . Depression    takes CYmbalta daily  . Diabetic neuropathy (HCC)   . Diarrhea    pt takes Reglan tid  . GERD (gastroesophageal reflux disease)   . Hyperlipidemia   . Hypertension   .  Hypothyroidism    takes Synthroid daily  . Kidney stone    "passed on before" (2013/04/30)  . MRSA (methicillin resistant staph aureus) culture positive    hx of 2012  . Neuromuscular disorder (HCC)   . Non-healing wound of lower extremity    lt foot  . Orthopnea    "just one; yesterday morning" (April 30, 2013)  . OSA on CPAP    and oxygen  . Peripheral vascular disease (HCC)   . Pneumonia 1980's   "hospitalized w/it" (04/30/2013)  . Rheumatoid arthritis (HCC)    "both knees" (04/30/2013)  . Staph infection 2007  . Symptomatic bradycardia 03/04/2014  . Thyroid disease   . Type II diabetes mellitus (HCC)    Novolog,Levemir,and Lovaza daily    PAST SURGICAL HISTORY: Past Surgical History:  Procedure Laterality Date  . AMPUTATION    . BLADDER SURGERY  1970's   "stretched the mouth of my bladder" (2013-04-30)  . DILATION AND CURETTAGE OF UTERUS  1980's  . FOOT SURGERY Right 2012   "put pins in one year; amputated toes another OR" (2013-04-30)  . GANGLION CYST EXCISION  Left 1990's   "wrist" (04/30/2013)  . I&D EXTREMITY Left 08/25/2013   Procedure: IRRIGATION AND DEBRIDEMENT EXTREMITY;  Surgeon: Axel Filler, MD;  Location: [facility] OR;  Service: General;  Laterality: Left;  . INCISION AND DRAINAGE ABSCESS N/A 01/24/2013   Procedure: INCISION AND DRAINAGE ABSCESS;  Surgeon: Clovis Pu. Cornett, MD;  Location: [facility] OR;  Service: General;  Laterality: N/A;  . INGUINAL HERNIA REPAIR N/A 01/28/2013   Procedure: I&D Right Groin Wound;  Surgeon: Shelly Rubenstein, MD;  Location: [facility] OR;  Service: General;  Laterality: N/A;  . LEG AMPUTATION BELOW KNEE Right 2012  . PACEMAKER INSERTION  03/05/14   Biotronik dual chamber pacemaker implanted by Dr Graciela Husbands for symptomatic bradycardia  . PERMANENT PACEMAKER INSERTION N/A 03/05/2014   Procedure: PERMANENT PACEMAKER INSERTION;  Surgeon: Duke Salvia, MD;  Location: [facility] CATH LAB;  Service: Cardiovascular;  Laterality: N/A;  . REFRACTIVE SURGERY Bilateral 2014  . STUMP REVISION Right 12/11/2014   Procedure: Right Below Knee Amputation Revision;  Surgeon: Nadara Mustard, MD;  Location: [facility] OR;  Service: Orthopedics;  Laterality: Right;  . TOE AMPUTATION Left ?2011    only 2 toes remaning.   . TUBAL LIGATION  1990's  . WRIST SURGERY Right 1980's   "pinched nerve" (04-30-2013)    FAMILY HISTORY: Family History  Problem Relation Age of Onset  . Hypertension Mother   . Hyperlipidemia Mother   . Hypertrophic cardiomyopathy Mother        has pacer  . Alzheimer's disease Father   . Hypertension Brother   . Hyperlipidemia Brother   . Cancer Maternal Aunt   . Diabetes Paternal Aunt   . Diabetes Paternal Uncle   . Diabetes Paternal Grandmother   . Anesthesia problems Neg Hx   . Hypotension Neg Hx   . Malignant hyperthermia Neg Hx   . Pseudochol deficiency Neg Hx     SOCIAL HISTORY: Social History   Social History  . Marital status: Single    Spouse name: N/A  . Number of children: N/A  . Years of education: N/A   Occupational  History  . Not on file.   Social History Main Topics  . Smoking status: Current Every Day Smoker    Packs/day: 0.50    Years: 33.00    Types: Cigarettes  . Smokeless tobacco: Never Used  . Alcohol use No  .  Drug use: No  . Sexual activity: No   Other Topics Concern  . Not on file   Social History Narrative  . No narrative on file     PHYSICAL EXAM  Vitals:   04/10/17 1323  BP: 98/65  Pulse: 69  Height: 5\' 5"  (1.651 m)   There is no height or weight on file to calculate BMI.  Generalized: Well developed, in no acute distress  Head: normocephalic and atraumatic,. Oropharynx benign  Neck: Supple, no carotid bruits  Cardiac: Regular rate rhythm, no murmur  Musculoskeletal: right below-knee amputation. Left foot partial amputation Neurological examination   Mentation: Alert oriented to time, place, history taking. Attention span and concentration appropriate. Recent and remote memory intact.  Follows all commands speech and language fluent.   Cranial nerve II-XII: Fundoscopic exam reveals sharp disc margins.Pupils were equal round reactive to light extraocular movements were full, visual field were full on confrontational test. Facial sensation and strength were normal. hearing was intact to finger rubbing bilaterally. Uvula tongue midline. head turning and shoulder shrug were normal and symmetric.Tongue protrusion into cheek strength was normal. Motor: normal bulk and tone, full strength in the BUE, right thigh and left leg Sensory: normal and symmetric to light touch, pinprick, and  Vibration,  Coordination: finger-nose-finger, heel-to-shin bilaterally, no dysmetria Reflexes: 1+ upper lower and symmetric Gait and Station: In wheelchair not ambulated, supposed to be nonweightbearing DIAGNOSTIC DATA (LABS, IMAGING, TESTING) - I reviewed patient records, labs, notes, testing and imaging myself where available.  Lab Results  Component Value Date   WBC 6.9 07/21/2016   HGB  12.9 07/21/2016   HCT 38.3 07/21/2016   MCV 83.6 07/21/2016   PLT 192 07/21/2016      Component Value Date/Time   NA 133 (L) 07/21/2016 1340   K 5.1 07/21/2016 1340   CL 97 (L) 07/21/2016 1340   CO2 25 07/21/2016 1340   GLUCOSE 457 (H) 07/21/2016 1340   BUN 31 (H) 07/21/2016 1340   CREATININE 1.38 (H) 07/21/2016 1340   CALCIUM 9.3 07/21/2016 1340   PROT 6.7 07/21/2016 1340   ALBUMIN 2.9 (L) 07/21/2016 1340   AST 22 07/21/2016 1340   ALT 19 07/21/2016 1340   ALKPHOS 99 07/21/2016 1340   BILITOT 0.2 (L) 07/21/2016 1340   GFRNONAA 42 (L) 07/21/2016 1340   GFRAA 49 (L) 07/21/2016 1340   Lab Results  Component Value Date   CHOL 221 (H) 07/23/2016   HDL 27 (L) 07/23/2016   LDLCALC UNABLE TO CALCULATE IF TRIGLYCERIDE OVER 400 mg/dL 40/98/1191   TRIG 478 (H) 07/23/2016   CHOLHDL 8.2 07/23/2016   Lab Results  Component Value Date   HGBA1C 12.6 (H) 07/05/2016   Lab Results  Component Value Date   VITAMINB12 787 08/21/2013   Lab Results  Component Value Date   TSH 2.818 07/22/2016      ASSESSMENT AND PLAN 56 year old Caucasian lady with transient episode of facial numbness possibly a TIA secondary to small vessel disease. Recent history of TIA in November 2017 due to small vessel disease as well. Multiple vascular risk factors of obesity, smoking, diabetes, hyperlipidemia and silent cerebrovascular disease.The patient is a current patient of Dr. Pearlean Brownie  who is out of the office today . This note is sent to the work in doctor.     Stressed the importance of management of risk factors to prevent further stroke Continue Plavix for secondary stroke prevention/TIA Maintain strict control of hypertension with blood pressure goal below 130/90,  continue antihypertensive medications Control of diabetes with hemoglobin A1c below 6.5 followed by endocrinology continue diabetic medications Cholesterol with LDL cholesterol less than 70, followed by primary care,  continue statin drugs  Crestor Chair exercise   eat healthy diet with whole grains,  fresh fruits and vegetables Try to stop smoking altogether Will discharge from stroke clinic I spent 25 minutes in total face to face time with the patient more than 50% of which was spent counseling and coordination of care, reviewing test results reviewing medications and discussing and reviewing the diagnosis of TIA and management of risk factors and importance of stopping smoking and getting diabetes in good control. Nilda Riggs, [facility], [facility], APRN  [facility] [address] [place], [place] [address] [phone]

## 2017-04-10 ENCOUNTER — Ambulatory Visit (INDEPENDENT_AMBULATORY_CARE_PROVIDER_SITE_OTHER): Payer: Medicare HMO | Admitting: Nurse Practitioner

## 2017-04-10 ENCOUNTER — Encounter: Payer: Self-pay | Admitting: Nurse Practitioner

## 2017-04-10 VITALS — BP 98/65 | HR 69 | Ht 65.0 in

## 2017-04-10 DIAGNOSIS — E785 Hyperlipidemia, unspecified: Secondary | ICD-10-CM | POA: Diagnosis not present

## 2017-04-10 DIAGNOSIS — Z794 Long term (current) use of insulin: Secondary | ICD-10-CM | POA: Diagnosis not present

## 2017-04-10 DIAGNOSIS — E1165 Type 2 diabetes mellitus with hyperglycemia: Secondary | ICD-10-CM

## 2017-04-10 DIAGNOSIS — F17200 Nicotine dependence, unspecified, uncomplicated: Secondary | ICD-10-CM

## 2017-04-10 DIAGNOSIS — E118 Type 2 diabetes mellitus with unspecified complications: Secondary | ICD-10-CM | POA: Diagnosis not present

## 2017-04-10 DIAGNOSIS — G458 Other transient cerebral ischemic attacks and related syndromes: Secondary | ICD-10-CM | POA: Diagnosis not present

## 2017-04-10 DIAGNOSIS — IMO0002 Reserved for concepts with insufficient information to code with codable children: Secondary | ICD-10-CM

## 2017-04-10 NOTE — Progress Notes (Signed)
I have read the note, and I agree with the clinical assessment and plan.  Richard A. Sater, MD, PhD, FAAN Certified in Neurology, Clinical Neurophysiology, Sleep Medicine, Pain Medicine and Neuroimaging  [facility] [address], [address] [place], [place] [address] [phone]  

## 2017-04-10 NOTE — Patient Instructions (Addendum)
Stressed the importance of management of risk factors to prevent further stroke Continue Plavix for secondary stroke prevention/TIA Maintain strict control of hypertension with blood pressure goal below 130/90,  continue antihypertensive medications Control of diabetes with hemoglobin A1c below 6.5 followed by endocrinology continue diabetic medications Cholesterol with LDL cholesterol less than 70, followed by primary care,  continue statin drugs Crestor Chair exercise   eat healthy diet with whole grains,  fresh fruits and vegetables Try to stop smoking altogether Will discharge from stroke clinic  Stroke Prevention Some health problems and behaviors may make it more likely for you to have a stroke. Below are ways to lessen your risk of having a stroke.  Be active for at least 30 minutes on most or all days.  Do not smoke. Try not to be around others who smoke.  Do not drink too much alcohol. ? Do not have more than 2 drinks a day if you are a man. ? Do not have more than 1 drink a day if you are a woman and are not pregnant.  Eat healthy foods, such as fruits and vegetables. If you were put on a specific diet, follow the diet as told.  Keep your cholesterol levels under control through diet and medicines. Look for foods that are low in saturated fat, trans fat, cholesterol, and are high in fiber.  If you have diabetes, follow all diet plans and take your medicine as told.  Ask your doctor if you need treatment to lower your blood pressure. If you have high blood pressure (hypertension), follow all diet plans and take your medicine as told by your doctor.  If you are 66-22 years old, have your blood pressure checked every 3-5 years. If you are age 75 or older, have your blood pressure checked every year.  Keep a healthy weight. Eat foods that are low in calories, salt, saturated fat, trans fat, and cholesterol.  Do not take drugs.  Avoid birth control pills, if this applies. Talk  to your doctor about the risks of taking birth control pills.  Talk to your doctor if you have sleep problems (sleep apnea).  Take all medicine as told by your doctor. ? You may be told to take aspirin or blood thinner medicine. Take this medicine as told by your doctor. ? Understand your medicine instructions.  Make sure any other conditions you have are being taken care of.  Get help right away if:  You suddenly lose feeling (you feel numb) or have weakness in your face, arm, or leg.  Your face or eyelid hangs down to one side.  You suddenly feel confused.  You have trouble talking (aphasia) or understanding what people are saying.  You suddenly have trouble seeing in one or both eyes.  You suddenly have trouble walking.  You are dizzy.  You lose your balance or your movements are clumsy (uncoordinated).  You suddenly have a very bad headache and you do not know the cause.  You have new chest pain.  Your heart feels like it is fluttering or skipping a beat (irregular heartbeat). Do not wait to see if the symptoms above go away. Get help right away. Call your local emergency services (911 in [place] Do not drive yourself to the hospital. This information is not intended to replace advice given to you by your health care provider. Make sure you discuss any questions you have with your health care provider. Document Released: 02/06/2012 Document Revised: 01/13/2016 Document Reviewed: 02/07/2013  Elsevier Interactive Patient Education  2018 [employer]  

## 2017-04-26 ENCOUNTER — Ambulatory Visit (INDEPENDENT_AMBULATORY_CARE_PROVIDER_SITE_OTHER): Payer: Medicare HMO | Admitting: Orthopedic Surgery

## 2017-06-12 ENCOUNTER — Encounter: Payer: Self-pay | Admitting: Nurse Practitioner

## 2017-06-12 NOTE — Progress Notes (Deleted)
Electrophysiology Office Note Date: 06/12/2017  ID:  [name], DOB 1960-09-27, MRN [id number]  PCP: Loyal Jacobson, MD Electrophysiologist: Graciela Husbands  CC: Pacemaker follow-up  [name] is a 57 y.o. female seen today for Dr Graciela Husbands.  She presents today for routine electrophysiology followup.  Since last being seen in our clinic, the patient reports doing very well.  She denies chest pain, palpitations, dyspnea, PND, orthopnea, nausea, vomiting, dizziness, syncope, edema, weight gain, or early satiety.  Device History: Biotronik dual chamber PPM implanted 2015 for sinus bradycardia   Past Medical History:  Diagnosis Date  . Anxiety   . Cellulitis   . Complication of anesthesia 01/2013   "didn't know where I was; who I was; talking out the top of my head" (04/21/2013)  . Concussion as a teenager   slight  . COPD (chronic obstructive pulmonary disease) (HCC)    wear oxygen at home  . Depression   . Diabetic neuropathy (HCC)   . GERD (gastroesophageal reflux disease)   . Hyperlipidemia   . Hypertension   . Hypothyroidism    takes Synthroid daily  . MRSA (methicillin resistant staph aureus) culture positive    hx of 2012  . Neuromuscular disorder (HCC)   . OSA on CPAP    and oxygen  . Peripheral vascular disease (HCC)   . Staph infection 2007  . Symptomatic bradycardia 03/04/2014  . TIA (transient ischemic attack)   . Type II diabetes mellitus (HCC)    Past Surgical History:  Procedure Laterality Date  . BLADDER SURGERY  1970's   "stretched the mouth of my bladder" (04/21/2013)  . DILATION AND CURETTAGE OF UTERUS  1980's  . FOOT SURGERY Right 2012   "put pins in one year; amputated toes another OR" (04/21/2013)  . GANGLION CYST EXCISION Left 1990's   "wrist" (04/21/2013)  . I&D EXTREMITY Left 08/25/2013   Procedure: IRRIGATION AND DEBRIDEMENT EXTREMITY;  Surgeon: Axel Filler, MD;  Location: [facility] OR;  Service: General;  Laterality: Left;  . INCISION AND DRAINAGE  ABSCESS N/A 01/24/2013   Procedure: INCISION AND DRAINAGE ABSCESS;  Surgeon: Clovis Pu. Cornett, MD;  Location: [facility] OR;  Service: General;  Laterality: N/A;  . INGUINAL HERNIA REPAIR N/A 01/28/2013   Procedure: I&D Right Groin Wound;  Surgeon: Shelly Rubenstein, MD;  Location: [facility] OR;  Service: General;  Laterality: N/A;  . LEG AMPUTATION BELOW KNEE Right 2012  . PERMANENT PACEMAKER INSERTION N/A 03/05/2014   Biotronik dual chamber pacemaker implanted by Dr Graciela Husbands for symptomatic bradycardia  . REFRACTIVE SURGERY Bilateral 2014  . STUMP REVISION Right 12/11/2014   Procedure: Right Below Knee Amputation Revision;  Surgeon: Nadara Mustard, MD;  Location: [facility] OR;  Service: Orthopedics;  Laterality: Right;  . TOE AMPUTATION Left ?2011    only 2 toes remaning.   . TUBAL LIGATION  1990's  . WRIST SURGERY Right 1980's   "pinched nerve" (04/21/2013)    Current Outpatient Prescriptions  Medication Sig Dispense Refill  . amLODipine (NORVASC) 10 MG tablet Take 1 tablet (10 mg total) by mouth daily. 30 tablet 0  . carvedilol (COREG) 3.125 MG tablet Take 1 tablet (3.125 mg total) by mouth 2 (two) times daily with a meal. 30 tablet 0  . clopidogrel (PLAVIX) 75 MG tablet Take 1 tablet (75 mg total) by mouth daily. 30 tablet 0  . doxycycline (VIBRA-TABS) 100 MG tablet Take 1 tablet (100 mg total) by mouth 2 (two) times daily. 20 tablet 0  .  EASY COMFORT LANCETS MISC test blood sugar THREE TIMES DAILY  12  . fenofibrate 160 MG tablet Take 1 tablet (160 mg total) by mouth daily. 30 tablet 0  . FLUoxetine (PROZAC) 20 MG tablet Take 60 mg by mouth daily.  0  . furosemide (LASIX) 40 MG tablet Take 0.5 tablets (20 mg total) by mouth daily. 30 tablet 0  . insulin aspart (NOVOLOG FLEXPEN) 100 UNIT/ML FlexPen Inject 0-50 Units into the skin 3 (three) times daily with meals. Per sliding scale    . insulin detemir (LEVEMIR) 100 UNIT/ML injection Inject 0.55 mLs (55 Units total) into the skin 2 (two) times daily. (Patient  taking differently: Inject 45 Units into the skin 2 (two) times daily. ) 10 mL 0  . levothyroxine (SYNTHROID, LEVOTHROID) 25 MCG tablet Take 2 tablets (50 mcg total) by mouth every morning. 30 tablet 0  . lisinopril (PRINIVIL,ZESTRIL) 5 MG tablet Take 5 mg by mouth daily.    . meclizine (ANTIVERT) 25 MG tablet Take 1 tablet (25 mg total) by mouth 3 (three) times daily as needed for dizziness. 30 tablet 0  . metoCLOPramide (REGLAN) 10 MG tablet Take 10 mg by mouth 4 (four) times daily. Take 1 tablet (10 mg) by mouth 30 minutes before meals and at bedtime  11  . omeprazole (PRILOSEC) 40 MG capsule Take 40 mg by mouth at bedtime.  3  . oxybutynin (DITROPAN-XL) 5 MG 24 hr tablet Take 10 mg by mouth daily.     . rosuvastatin (CRESTOR) 20 MG tablet Take 40 mg by mouth daily.   6  . silver sulfADIAZINE (SILVADENE) 1 % cream Apply to wound daily 400 g 0  . sulfamethoxazole-trimethoprim (BACTRIM DS,SEPTRA DS) 800-160 MG tablet Take 1 tablet by mouth 2 (two) times daily. 28 tablet 0   No current facility-administered medications for this visit.     Allergies:   Erythromycin; Iodine-131; Gadolinium; Ivp dye [iodinated diagnostic agents]; Metformin; and Penicillins   Social History: Social History   Social History  . Marital status: Single    Spouse name: N/A  . Number of children: N/A  . Years of education: N/A   Occupational History  . Not on file.   Social History Main Topics  . Smoking status: Current Every Day Smoker    Packs/day: 0.50    Years: 33.00    Types: Cigarettes  . Smokeless tobacco: Never Used  . Alcohol use No  . Drug use: No  . Sexual activity: No   Other Topics Concern  . Not on file   Social History Narrative  . No narrative on file    Family History: Family History  Problem Relation Age of Onset  . Hypertension Mother   . Hyperlipidemia Mother   . Hypertrophic cardiomyopathy Mother        has pacer  . Alzheimer's disease Father   . Hypertension Brother     . Hyperlipidemia Brother   . Cancer Maternal Aunt   . Diabetes Paternal Aunt   . Diabetes Paternal Uncle   . Diabetes Paternal Grandmother   . Anesthesia problems Neg Hx   . Hypotension Neg Hx   . Malignant hyperthermia Neg Hx   . Pseudochol deficiency Neg Hx      Review of Systems: All other systems reviewed and are otherwise negative except as noted above.   Physical Exam: VS:  There were no vitals taken for this visit. , BMI There is no height or weight on file to calculate  BMI.  GEN- The patient is well appearing, alert and oriented x 3 today.   HEENT: normocephalic, atraumatic; sclera clear, conjunctiva pink; hearing intact; oropharynx clear; neck supple  Lungs- Clear to ausculation bilaterally, normal work of breathing.  No wheezes, rales, rhonchi Heart- Regular rate and rhythm, no murmurs, rubs or gallops  GI- soft, non-tender, non-distended, bowel sounds present  Extremities- s/p BKA MS- no significant deformity or atrophy Skin- warm and dry, no rash or lesion; PPM pocket well healed Psych- euthymic mood, full affect Neuro- strength and sensation are intact  PPM Interrogation- reviewed in detail today,  See PACEART report  EKG:  EKG is not ordered today.  Recent Labs: 07/21/2016: ALT 19; BUN 31; Creatinine, Ser 1.38; Hemoglobin 12.9; Platelets 192; Potassium 5.1; Sodium 133 07/22/2016: B Natriuretic Peptide 13.7; TSH 2.818   Wt Readings from Last 3 Encounters:  03/19/17 216 lb (98 kg)  02/26/17 216 lb (98 kg)  11/01/16 216 lb (98 kg)     Other studies Reviewed: Additional studies/ records that were reviewed today include: Dr Odessa Fleming office notes  Assessment and Plan:  1.  Symptomatic bradycardia Normal PPM function See Pace Art report No changes today  2.  Prior TIA No atrial arrhythmias identified on device interrogation to date   Current medicines are reviewed at length with the patient today.   The patient does not have concerns regarding her  medicines.  The following changes were made today:  none  Labs/ tests ordered today include: none No orders of the defined types were placed in this encounter.    Disposition:   Follow up with home monitoring, Dr Graciela Husbands 1 year      Signed, Gypsy Balsam, NP 06/12/2017 7:31 AM  [facility] [address] [place] [address] [phone] (office) [phone] (fax)

## 2017-06-13 ENCOUNTER — Encounter: Payer: Medicare HMO | Admitting: Nurse Practitioner

## 2017-06-18 ENCOUNTER — Emergency Department (HOSPITAL_COMMUNITY): Payer: Medicare HMO

## 2017-06-18 ENCOUNTER — Encounter (HOSPITAL_COMMUNITY): Payer: Self-pay

## 2017-06-18 ENCOUNTER — Inpatient Hospital Stay (HOSPITAL_COMMUNITY)
Admission: EM | Admit: 2017-06-18 | Discharge: 2017-06-26 | DRG: 617 | Disposition: A | Discharge status: Skilled Nursing Facility | Payer: Medicare HMO | Attending: Internal Medicine | Admitting: Internal Medicine

## 2017-06-18 DIAGNOSIS — G4733 Obstructive sleep apnea (adult) (pediatric): Secondary | ICD-10-CM | POA: Diagnosis present

## 2017-06-18 DIAGNOSIS — E1165 Type 2 diabetes mellitus with hyperglycemia: Secondary | ICD-10-CM | POA: Diagnosis present

## 2017-06-18 DIAGNOSIS — K3184 Gastroparesis: Secondary | ICD-10-CM | POA: Diagnosis present

## 2017-06-18 DIAGNOSIS — I130 Hypertensive heart and chronic kidney disease with heart failure and stage 1 through stage 4 chronic kidney disease, or unspecified chronic kidney disease: Secondary | ICD-10-CM | POA: Diagnosis present

## 2017-06-18 DIAGNOSIS — Z950 Presence of cardiac pacemaker: Secondary | ICD-10-CM

## 2017-06-18 DIAGNOSIS — K219 Gastro-esophageal reflux disease without esophagitis: Secondary | ICD-10-CM | POA: Diagnosis present

## 2017-06-18 DIAGNOSIS — E11628 Type 2 diabetes mellitus with other skin complications: Secondary | ICD-10-CM | POA: Diagnosis present

## 2017-06-18 DIAGNOSIS — F17210 Nicotine dependence, cigarettes, uncomplicated: Secondary | ICD-10-CM | POA: Diagnosis present

## 2017-06-18 DIAGNOSIS — Z888 Allergy status to other drugs, medicaments and biological substances status: Secondary | ICD-10-CM

## 2017-06-18 DIAGNOSIS — R8290 Unspecified abnormal findings in urine: Secondary | ICD-10-CM | POA: Diagnosis not present

## 2017-06-18 DIAGNOSIS — R739 Hyperglycemia, unspecified: Secondary | ICD-10-CM | POA: Diagnosis present

## 2017-06-18 DIAGNOSIS — I5032 Chronic diastolic (congestive) heart failure: Secondary | ICD-10-CM | POA: Diagnosis present

## 2017-06-18 DIAGNOSIS — E1143 Type 2 diabetes mellitus with diabetic autonomic (poly)neuropathy: Secondary | ICD-10-CM | POA: Diagnosis present

## 2017-06-18 DIAGNOSIS — Z794 Long term (current) use of insulin: Secondary | ICD-10-CM

## 2017-06-18 DIAGNOSIS — J449 Chronic obstructive pulmonary disease, unspecified: Secondary | ICD-10-CM | POA: Diagnosis present

## 2017-06-18 DIAGNOSIS — N179 Acute kidney failure, unspecified: Secondary | ICD-10-CM | POA: Diagnosis present

## 2017-06-18 DIAGNOSIS — E11621 Type 2 diabetes mellitus with foot ulcer: Secondary | ICD-10-CM | POA: Diagnosis present

## 2017-06-18 DIAGNOSIS — Z79899 Other long term (current) drug therapy: Secondary | ICD-10-CM

## 2017-06-18 DIAGNOSIS — L02612 Cutaneous abscess of left foot: Secondary | ICD-10-CM | POA: Diagnosis not present

## 2017-06-18 DIAGNOSIS — E039 Hypothyroidism, unspecified: Secondary | ICD-10-CM | POA: Diagnosis present

## 2017-06-18 DIAGNOSIS — IMO0002 Reserved for concepts with insufficient information to code with codable children: Secondary | ICD-10-CM | POA: Diagnosis present

## 2017-06-18 DIAGNOSIS — N183 Chronic kidney disease, stage 3 unspecified: Secondary | ICD-10-CM | POA: Diagnosis present

## 2017-06-18 DIAGNOSIS — L97509 Non-pressure chronic ulcer of other part of unspecified foot with unspecified severity: Secondary | ICD-10-CM | POA: Diagnosis present

## 2017-06-18 DIAGNOSIS — D638 Anemia in other chronic diseases classified elsewhere: Secondary | ICD-10-CM | POA: Diagnosis present

## 2017-06-18 DIAGNOSIS — E1169 Type 2 diabetes mellitus with other specified complication: Secondary | ICD-10-CM | POA: Diagnosis present

## 2017-06-18 DIAGNOSIS — E1122 Type 2 diabetes mellitus with diabetic chronic kidney disease: Secondary | ICD-10-CM | POA: Diagnosis present

## 2017-06-18 DIAGNOSIS — L0390 Cellulitis, unspecified: Secondary | ICD-10-CM | POA: Diagnosis present

## 2017-06-18 DIAGNOSIS — E785 Hyperlipidemia, unspecified: Secondary | ICD-10-CM | POA: Diagnosis present

## 2017-06-18 DIAGNOSIS — Z91041 Radiographic dye allergy status: Secondary | ICD-10-CM

## 2017-06-18 DIAGNOSIS — L97529 Non-pressure chronic ulcer of other part of left foot with unspecified severity: Secondary | ICD-10-CM | POA: Diagnosis not present

## 2017-06-18 DIAGNOSIS — L03116 Cellulitis of left lower limb: Secondary | ICD-10-CM | POA: Diagnosis present

## 2017-06-18 DIAGNOSIS — Z8673 Personal history of transient ischemic attack (TIA), and cerebral infarction without residual deficits: Secondary | ICD-10-CM

## 2017-06-18 DIAGNOSIS — E1151 Type 2 diabetes mellitus with diabetic peripheral angiopathy without gangrene: Secondary | ICD-10-CM | POA: Diagnosis present

## 2017-06-18 DIAGNOSIS — E118 Type 2 diabetes mellitus with unspecified complications: Secondary | ICD-10-CM | POA: Diagnosis not present

## 2017-06-18 DIAGNOSIS — Z833 Family history of diabetes mellitus: Secondary | ICD-10-CM

## 2017-06-18 DIAGNOSIS — L03115 Cellulitis of right lower limb: Secondary | ICD-10-CM | POA: Diagnosis not present

## 2017-06-18 DIAGNOSIS — Z8249 Family history of ischemic heart disease and other diseases of the circulatory system: Secondary | ICD-10-CM

## 2017-06-18 DIAGNOSIS — Z89511 Acquired absence of right leg below knee: Secondary | ICD-10-CM | POA: Diagnosis not present

## 2017-06-18 DIAGNOSIS — N1832 Chronic kidney disease, stage 3b: Secondary | ICD-10-CM | POA: Diagnosis present

## 2017-06-18 HISTORY — DX: Chronic diastolic (congestive) heart failure: I50.32

## 2017-06-18 LAB — CBC
HEMATOCRIT: 30.3 % — AB (ref 36.0–46.0)
HEMOGLOBIN: 10.5 g/dL — AB (ref 12.0–15.0)
MCH: 28.8 pg (ref 26.0–34.0)
MCHC: 34.7 g/dL (ref 30.0–36.0)
MCV: 83 fL (ref 78.0–100.0)
Platelets: 365 10*3/uL (ref 150–400)
RBC: 3.65 MIL/uL — ABNORMAL LOW (ref 3.87–5.11)
RDW: 12.8 % (ref 11.5–15.5)
WBC: 9.3 10*3/uL (ref 4.0–10.5)

## 2017-06-18 LAB — URINALYSIS, ROUTINE W REFLEX MICROSCOPIC
BILIRUBIN URINE: NEGATIVE
Glucose, UA: >=500 mg/dL — AB
Ketones, ur: NEGATIVE mg/dL
Nitrite: NEGATIVE
Protein, ur: 30 mg/dL — AB
SPECIFIC GRAVITY, URINE: 1.018 (ref 1.005–1.030)
pH: 5 (ref 5.0–8.0)

## 2017-06-18 LAB — BASIC METABOLIC PANEL
ANION GAP: 16 — AB (ref 5–15)
ANION GAP: 10 (ref 5–15)
BUN: 44 mg/dL — ABNORMAL HIGH (ref 6–20)
BUN: 39 mg/dL — ABNORMAL HIGH (ref 6–20)
CHLORIDE: 87 mmol/L — AB (ref 101–111)
CHLORIDE: 94 mmol/L — AB (ref 101–111)
CO2: 22 mmol/L (ref 22–32)
CO2: 26 mmol/L (ref 22–32)
Calcium: 9.4 mg/dL (ref 8.9–10.3)
Calcium: 8.8 mg/dL — ABNORMAL LOW (ref 8.9–10.3)
Creatinine, Ser: 1.81 mg/dL — ABNORMAL HIGH (ref 0.44–1.00)
Creatinine, Ser: 1.64 mg/dL — ABNORMAL HIGH (ref 0.44–1.00)
GFR calc Af Amer: 35 mL/min — ABNORMAL LOW (ref >60)
GFR calc non Af Amer: 30 mL/min — ABNORMAL LOW (ref >60)
GFR calc non Af Amer: 34 mL/min — ABNORMAL LOW (ref >60)
GFR, EST AFRICAN AMERICAN: 40 mL/min — AB (ref >60)
GLUCOSE: 801 mg/dL — AB (ref 65–99)
Glucose, Bld: 584 mg/dL (ref 65–99)
POTASSIUM: 5 mmol/L (ref 3.5–5.1)
POTASSIUM: 5.2 mmol/L — AB (ref 3.5–5.1)
SODIUM: 130 mmol/L — AB (ref 135–145)
Sodium: 125 mmol/L — ABNORMAL LOW (ref 135–145)

## 2017-06-18 LAB — CBG MONITORING, ED
GLUCOSE-CAPILLARY: 494 mg/dL — AB (ref 65–99)
Glucose-Capillary: 590 mg/dL (ref 65–99)

## 2017-06-18 LAB — PROTIME-INR
INR: 1.13
Prothrombin Time: 14.5 seconds (ref 11.4–15.2)

## 2017-06-18 LAB — MAGNESIUM: Magnesium: 1.9 mg/dL (ref 1.7–2.4)

## 2017-06-18 LAB — APTT: aPTT: 32 seconds (ref 24–36)

## 2017-06-18 LAB — PHOSPHORUS: Phosphorus: 2.7 mg/dL (ref 2.5–4.6)

## 2017-06-18 LAB — GLUCOSE, CAPILLARY: GLUCOSE-CAPILLARY: 536 mg/dL — AB (ref 65–99)

## 2017-06-18 MED ORDER — SODIUM CHLORIDE 0.9 % IV BOLUS (SEPSIS)
2000.0000 mL | Freq: Once | INTRAVENOUS | Status: AC
  Administered 2017-06-18: 2000 mL via INTRAVENOUS

## 2017-06-18 MED ORDER — VANCOMYCIN HCL 10 G IV SOLR
1500.0000 mg | Freq: Once | INTRAVENOUS | Status: AC
  Administered 2017-06-18: 1500 mg via INTRAVENOUS
  Filled 2017-06-18: qty 1500

## 2017-06-18 MED ORDER — ONDANSETRON HCL 4 MG/2ML IJ SOLN: 4.0000 mg | Freq: Four times a day (QID) | INTRAMUSCULAR | Status: DC | PRN

## 2017-06-18 MED ORDER — VANCOMYCIN HCL 10 G IV SOLR
1250.0000 mg | INTRAVENOUS | Status: DC
  Administered 2017-06-19: 1250 mg via INTRAVENOUS
  Filled 2017-06-18: qty 1250

## 2017-06-18 MED ORDER — HEPARIN SODIUM (PORCINE) 5000 UNIT/ML IJ SOLN
5000.0000 [IU] | Freq: Three times a day (TID) | INTRAMUSCULAR | Status: DC
  Administered 2017-06-18 – 2017-06-26 (×22): 5000 [IU] via SUBCUTANEOUS
  Filled 2017-06-18 (×23): qty 1

## 2017-06-18 MED ORDER — DEXTROSE 5 % IV SOLN
1.0000 g | INTRAVENOUS | Status: DC
  Administered 2017-06-18 – 2017-06-19 (×2): 1 g via INTRAVENOUS
  Filled 2017-06-18 (×2): qty 1

## 2017-06-18 MED ORDER — HYDRALAZINE HCL 20 MG/ML IJ SOLN
5.0000 mg | INTRAMUSCULAR | Status: DC | PRN
  Administered 2017-06-20 – 2017-06-25 (×2): 5 mg via INTRAVENOUS
  Filled 2017-06-18 (×2): qty 1

## 2017-06-18 MED ORDER — OXYBUTYNIN CHLORIDE ER 10 MG PO TB24
10.0000 mg | ORAL_TABLET | Freq: Every day | ORAL | Status: DC
  Administered 2017-06-18 – 2017-06-26 (×8): 10 mg via ORAL
  Filled 2017-06-18 (×9): qty 1

## 2017-06-18 MED ORDER — LEVOTHYROXINE SODIUM 50 MCG PO TABS
50.0000 ug | ORAL_TABLET | ORAL | Status: DC
  Administered 2017-06-18 – 2017-06-26 (×7): 50 ug via ORAL
  Filled 2017-06-18 (×8): qty 1

## 2017-06-18 MED ORDER — SODIUM CHLORIDE 0.9 % IV SOLN
INTRAVENOUS | Status: DC
  Administered 2017-06-18 – 2017-06-25 (×8): via INTRAVENOUS

## 2017-06-18 MED ORDER — ACETAMINOPHEN 325 MG PO TABS: 650.0000 mg | ORAL_TABLET | Freq: Four times a day (QID) | ORAL | Status: DC | PRN

## 2017-06-18 MED ORDER — INSULIN ASPART 100 UNIT/ML ~~LOC~~ SOLN
0.0000 [IU] | Freq: Every day | SUBCUTANEOUS | Status: DC
  Administered 2017-06-19: 4 [IU] via SUBCUTANEOUS
  Administered 2017-06-20: 5 [IU] via SUBCUTANEOUS
  Administered 2017-06-21: 4 [IU] via SUBCUTANEOUS
  Administered 2017-06-22 – 2017-06-25 (×4): 3 [IU] via SUBCUTANEOUS

## 2017-06-18 MED ORDER — VANCOMYCIN HCL IN DEXTROSE 1-5 GM/200ML-% IV SOLN: 1000.0000 mg | Freq: Once | INTRAVENOUS | Status: DC

## 2017-06-18 MED ORDER — LISINOPRIL 10 MG PO TABS
5.0000 mg | ORAL_TABLET | Freq: Every day | ORAL | Status: DC
  Administered 2017-06-19: 5 mg via ORAL
  Filled 2017-06-18 (×2): qty 1

## 2017-06-18 MED ORDER — ONDANSETRON HCL 4 MG PO TABS: 4.0000 mg | ORAL_TABLET | Freq: Four times a day (QID) | ORAL | Status: DC | PRN

## 2017-06-18 MED ORDER — OXYCODONE HCL 5 MG PO TABS
5.0000 mg | ORAL_TABLET | ORAL | Status: DC | PRN
  Administered 2017-06-22 – 2017-06-26 (×13): 5 mg via ORAL
  Filled 2017-06-18 (×13): qty 1

## 2017-06-18 MED ORDER — FUROSEMIDE 20 MG PO TABS
20.0000 mg | ORAL_TABLET | Freq: Every day | ORAL | Status: DC
  Administered 2017-06-19 – 2017-06-23 (×4): 20 mg via ORAL
  Filled 2017-06-18 (×5): qty 1

## 2017-06-18 MED ORDER — FLUOXETINE HCL 20 MG PO CAPS
40.0000 mg | ORAL_CAPSULE | Freq: Every day | ORAL | Status: DC
  Administered 2017-06-18 – 2017-06-26 (×8): 40 mg via ORAL
  Filled 2017-06-18 (×10): qty 2

## 2017-06-18 MED ORDER — CLOPIDOGREL BISULFATE 75 MG PO TABS
75.0000 mg | ORAL_TABLET | Freq: Every day | ORAL | Status: DC
  Administered 2017-06-19 – 2017-06-26 (×7): 75 mg via ORAL
  Filled 2017-06-18 (×7): qty 1

## 2017-06-18 MED ORDER — INSULIN ASPART 100 UNIT/ML ~~LOC~~ SOLN
0.0000 [IU] | Freq: Three times a day (TID) | SUBCUTANEOUS | Status: DC
  Administered 2017-06-19 – 2017-06-20 (×5): 20 [IU] via SUBCUTANEOUS
  Administered 2017-06-20: 11 [IU] via SUBCUTANEOUS
  Administered 2017-06-21: 20 [IU] via SUBCUTANEOUS
  Administered 2017-06-21: 11 [IU] via SUBCUTANEOUS
  Administered 2017-06-21: 15 [IU] via SUBCUTANEOUS
  Administered 2017-06-22: 7 [IU] via SUBCUTANEOUS
  Administered 2017-06-22: 11 [IU] via SUBCUTANEOUS
  Administered 2017-06-23: 15 [IU] via SUBCUTANEOUS
  Administered 2017-06-23 (×2): 11 [IU] via SUBCUTANEOUS
  Administered 2017-06-24: 15 [IU] via SUBCUTANEOUS
  Administered 2017-06-24: 4 [IU] via SUBCUTANEOUS
  Administered 2017-06-24: 15 [IU] via SUBCUTANEOUS
  Administered 2017-06-25: 11 [IU] via SUBCUTANEOUS
  Administered 2017-06-25 (×2): 15 [IU] via SUBCUTANEOUS
  Administered 2017-06-26 (×2): 11 [IU] via SUBCUTANEOUS

## 2017-06-18 MED ORDER — ROSUVASTATIN CALCIUM 20 MG PO TABS
40.0000 mg | ORAL_TABLET | Freq: Every day | ORAL | Status: DC
  Administered 2017-06-18 – 2017-06-26 (×8): 40 mg via ORAL
  Filled 2017-06-18 (×2): qty 1
  Filled 2017-06-18 (×8): qty 2
  Filled 2017-06-18 (×3): qty 1

## 2017-06-18 MED ORDER — CARVEDILOL 3.125 MG PO TABS
3.1250 mg | ORAL_TABLET | Freq: Two times a day (BID) | ORAL | Status: DC
  Administered 2017-06-19 – 2017-06-26 (×14): 3.125 mg via ORAL
  Filled 2017-06-18 (×14): qty 1

## 2017-06-18 MED ORDER — ACETAMINOPHEN 650 MG RE SUPP: 650.0000 mg | Freq: Four times a day (QID) | RECTAL | Status: DC | PRN

## 2017-06-18 MED ORDER — METOCLOPRAMIDE HCL 10 MG PO TABS
10.0000 mg | ORAL_TABLET | Freq: Four times a day (QID) | ORAL | Status: DC
  Administered 2017-06-18 – 2017-06-26 (×30): 10 mg via ORAL
  Filled 2017-06-18 (×30): qty 1

## 2017-06-18 NOTE — Consult Note (Signed)
ORTHOPAEDIC CONSULTATION  REQUESTING PHYSICIAN: Ozella Rocks, MD  Chief Complaint: Abscess cellulitis purulent drainage left foot  HPI: [name] is a 56 y.o. female who presents with diabetic insensate neuropathy she is status post transtibial amputation of the right as well as multiple right and toe amputations on the left.  Patient is unsure when she is started developing redness swelling odor and drainage of the left foot.  Past Medical History:  Diagnosis Date  . Anxiety   . Cellulitis   . Chronic diastolic (congestive) heart failure (HCC)   . Complication of anesthesia 01/2013   "didn't know where I was; who I was; talking out the top of my head" (04/21/2013)  . Concussion as a teenager   slight  . COPD (chronic obstructive pulmonary disease) (HCC)    wear oxygen at home  . Depression   . Diabetic neuropathy (HCC)   . GERD (gastroesophageal reflux disease)   . Hyperlipidemia   . Hypertension   . Hypothyroidism    takes Synthroid daily  . MRSA (methicillin resistant staph aureus) culture positive    hx of 2012  . Neuromuscular disorder (HCC)   . OSA on CPAP    and oxygen  . Peripheral vascular disease (HCC)   . Staph infection 2007  . Symptomatic bradycardia 03/04/2014  . TIA (transient ischemic attack)   . Type II diabetes mellitus (HCC)    Past Surgical History:  Procedure Laterality Date  . BLADDER SURGERY  1970's   "stretched the mouth of my bladder" (04/21/2013)  . DILATION AND CURETTAGE OF UTERUS  1980's  . FOOT SURGERY Right 2012   "put pins in one year; amputated toes another OR" (04/21/2013)  . GANGLION CYST EXCISION Left 1990's   "wrist" (04/21/2013)  . I&D EXTREMITY Left 08/25/2013   Procedure: IRRIGATION AND DEBRIDEMENT EXTREMITY;  Surgeon: Axel Filler, MD;  Location: [facility] OR;  Service: General;  Laterality: Left;  . INCISION AND DRAINAGE ABSCESS N/A 01/24/2013   Procedure: INCISION AND DRAINAGE ABSCESS;  Surgeon: Clovis Pu. Cornett, MD;   Location: [facility] OR;  Service: General;  Laterality: N/A;  . INGUINAL HERNIA REPAIR N/A 01/28/2013   Procedure: I&D Right Groin Wound;  Surgeon: Shelly Rubenstein, MD;  Location: [facility] OR;  Service: General;  Laterality: N/A;  . LEG AMPUTATION BELOW KNEE Right 2012  . PERMANENT PACEMAKER INSERTION N/A 03/05/2014   Biotronik dual chamber pacemaker implanted by Dr Graciela Husbands for symptomatic bradycardia  . REFRACTIVE SURGERY Bilateral 2014  . STUMP REVISION Right 12/11/2014   Procedure: Right Below Knee Amputation Revision;  Surgeon: Nadara Mustard, MD;  Location: [facility] OR;  Service: Orthopedics;  Laterality: Right;  . TOE AMPUTATION Left ?2011    only 2 toes remaning.   . TUBAL LIGATION  1990's  . WRIST SURGERY Right 1980's   "pinched nerve" (04/21/2013)   Social History   Social History  . Marital status: Single    Spouse name: N/A  . Number of children: N/A  . Years of education: N/A   Social History Main Topics  . Smoking status: Current Every Day Smoker    Packs/day: 0.50    Years: 33.00    Types: Cigarettes  . Smokeless tobacco: Never Used  . Alcohol use No  . Drug use: No  . Sexual activity: No   Other Topics Concern  . None   Social History Narrative  . None   Family History  Problem Relation Age of Onset  . Hypertension  Mother   . Hyperlipidemia Mother   . Hypertrophic cardiomyopathy Mother        has pacer  . Alzheimer's disease Father   . Hypertension Brother   . Hyperlipidemia Brother   . Cancer Maternal Aunt   . Diabetes Paternal Aunt   . Diabetes Paternal Uncle   . Diabetes Paternal Grandmother   . Anesthesia problems Neg Hx   . Hypotension Neg Hx   . Malignant hyperthermia Neg Hx   . Pseudochol deficiency Neg Hx    - negative except otherwise stated in the family history section Allergies  Allergen Reactions  . Erythromycin Diarrhea  . Iodine-131 Nausea And Vomiting  . Gadolinium Nausea And Vomiting     Code: VOM, Desc: Pt began vomiting immed post infusion of  multihance, Onset Date: [id number]   . Ivp Dye [Iodinated Diagnostic Agents] Nausea And Vomiting       . Metformin Diarrhea  . Penicillins Hives    Has patient had a PCN reaction causing immediate rash, facial/tongue/throat swelling, SOB or lightheadedness with hypotension: No Has patient had a PCN reaction causing severe rash involving mucus membranes or skin necrosis: No Has patient had a PCN reaction that required hospitalization No Has patient had a PCN reaction occurring within the last 10 years: No If all of the above answers are "NO", then may proceed with Cephalosporin use.    Prior to Admission medications   Medication Sig Start Date End Date Taking? Authorizing Provider  carvedilol (COREG) 3.125 MG tablet Take 1 tablet (3.125 mg total) by mouth 2 (two) times daily with a meal. 10/25/15  Yes Rolly Salter, MD  clopidogrel (PLAVIX) 75 MG tablet Take 1 tablet (75 mg total) by mouth daily. 07/24/16  Yes Ghimire, Werner Lean, MD  EASY COMFORT LANCETS MISC test blood sugar THREE TIMES DAILY 09/23/16  Yes [provider]  fenofibrate 160 MG tablet Take 1 tablet (160 mg total) by mouth daily. 07/24/16  Yes Ghimire, Werner Lean, MD  FLUoxetine (PROZAC) 20 MG tablet Take 40 mg by mouth daily.  11/24/15  Yes [provider]  furosemide (LASIX) 40 MG tablet Take 0.5 tablets (20 mg total) by mouth daily. 10/25/15  Yes Rolly Salter, MD  insulin regular (NOVOLIN R,HUMULIN R) 100 units/mL injection Inject 30-60 Units into the skin See admin instructions. 60 units at 0800, 50 units at 1400, 30 units at 2200 - additional as needed   Yes [provider]  levothyroxine (SYNTHROID, LEVOTHROID) 25 MCG tablet Take 2 tablets (50 mcg total) by mouth every morning. 10/25/15  Yes Rolly Salter, MD  lisinopril (PRINIVIL,ZESTRIL) 5 MG tablet Take 5 mg by mouth daily.   Yes [provider]  metoCLOPramide (REGLAN) 10 MG tablet Take 10 mg by mouth 4 (four) times daily. Take 1 tablet (10  mg) by mouth 30 minutes before meals and at bedtime 11/25/15  Yes [provider]  oxybutynin (DITROPAN-XL) 5 MG 24 hr tablet Take 10 mg by mouth daily.  11/22/15  Yes [provider]  rosuvastatin (CRESTOR) 20 MG tablet Take 40 mg by mouth daily.  11/22/15  Yes [provider]  amLODipine (NORVASC) 10 MG tablet Take 1 tablet (10 mg total) by mouth daily. Patient not taking: Reported on 06/18/2017 12/08/15   Leroy Sea, MD  doxycycline (VIBRA-TABS) 100 MG tablet Take 1 tablet (100 mg total) by mouth 2 (two) times daily. Patient not taking: Reported on 06/18/2017 04/05/17   Nadara Mustard, MD  meclizine (ANTIVERT) 25 MG tablet Take 1 tablet (25 mg total) by mouth 3 (three) times daily as needed for dizziness. Patient not taking: Reported on 06/18/2017 07/23/16   Maretta Bees, MD  silver sulfADIAZINE (SILVADENE) 1 % cream Apply to wound daily Patient not taking: Reported on 06/18/2017 09/14/16   Adonis Huguenin, NP  sulfamethoxazole-trimethoprim (BACTRIM DS,SEPTRA DS) 800-160 MG tablet Take 1 tablet by mouth 2 (two) times daily. Patient not taking: Reported on 06/18/2017 12/29/16   Adonis Huguenin, NP   Dg Foot Complete Left  Result Date: 06/18/2017 CLINICAL DATA:  Open foot wounds.  Evaluate for osteomyelitis. EXAM: LEFT FOOT - COMPLETE 3+ VIEW COMPARISON:  Left foot x-rays dated December 07, 2014. FINDINGS: Postsurgical changes related to prior amputation of the great toe and first metatarsal, as well as the second and third toes. No acute fracture or malalignment. No definite osteolysis or cortical destruction. Mild chronic cortical thickening of the second metatarsal shaft is unchanged. Severe midfoot degenerative changes are again noted. Diffuse soft tissue swelling of the forefoot with focal lucency at along the plantar aspect at the base of the medial cuneiform. IMPRESSION: 1. Plantar ulceration at the base of the medial cuneiform with diffuse soft tissue swelling of the  forefoot, consistent with cellulitis. No radiographic evidence of osteomyelitis. 2. Stable midfoot neuropathic changes and postsurgical changes from prior amputations. Electronically Signed   By: Obie Dredge M.D.   On: 06/18/2017 12:46   - pertinent xrays, CT, MRI studies were reviewed and independently interpreted  Positive ROS: All other systems have been reviewed and were otherwise negative with the exception of those mentioned in the HPI and as above.  Physical Exam: General: Alert, no acute distress Psychiatric: Patient is competent for consent with normal mood and affect Lymphatic: No axillary or cervical lymphadenopathy Cardiovascular: No pedal edema Respiratory: No cyanosis, no use of accessory musculature GI: No organomegaly, abdomen is soft and non-tender  Skin: Examination patient has swelling of the forefoot and midfoot with cellulitis and purulent drainage with ulceration dorsally on the foot as well as ulceration plantarly.   Neurologic: Patient does not have protective sensation bilateral lower extremities.   MUSCULOSKELETAL:  Patient does not have a palpable dorsalis pedis pulse.  She does not have cellulitis extending proximal to the ankle.  There is foul-smelling purulent drainage from the abscess of the foot with swelling to the ankle.  Assessment: Assessment: Diabetic insensate neuropathy status post foot salvage intervention to the left foot with a right transtibial amputation with abscess cellulitis swelling and purulent drainage left foot.  Plan: Plan: Patient does not have sufficient soft tissue to consider further foot salvage intervention on the left.  Patient will need a transtibial amputation on the left.  She will need to continue her IV antibiotics decrease the systemic bacteremia.  We will plan for a left transtibial amputation on Friday.  Patient will need discharge to skilled nursing postoperatively.  Thank you for the consult and the opportunity to  see Ms. Owens Shark, MD [facility] [phone] 6:12 PM

## 2017-06-18 NOTE — ED Notes (Signed)
Attempted report 

## 2017-06-18 NOTE — H&P (Signed)
History and Physical    JENIAH KISHI GYB:[id number] DOB: 07/24/1961 DOA: 06/18/2017  PCP: Loyal Jacobson, MD Patient coming from: home  Chief Complaint: bleeding and drainage from foot  HPI: [name] is a 56 y.o. female with medical history significant of diabetes, TIA, PVD, OSA on CPAP, hypothyroidism, hypertension, hyperlipidemia, GERD, diabetic neuropathy, depression, COPD, numerous previous lower extremity amputations. Patient reports being in her normal state of health until approximately 06/17/2017 which he developed bleeding and drainage from her left foot. Denies any recent trauma to the foot. Symptoms worsened significantly on day of admission as patient noted marked swelling and tenderness throughout her foot. When she tried to remove her socks on the morning of admission the top layer skin came off with the sock. Denies any fevers, chills, chest pain, shortness breath, palpitations, abdominal pain, dysuria, flank pain, neck stiffness, headache, focal neurological deficits. Patient reports taking her typical daily medications on day of admission. States her home glucose to be relatively 300 400 range.  ED Course: Plain film of the foot without evidence of osteomyelitis. Started on vancomycin and cefepime.  Review of Systems: As per HPI otherwise all other systems reviewed and are negative  Ambulatory Status:limited due to amputations  Past Medical History:  Diagnosis Date  . Anxiety   . Cellulitis   . Chronic diastolic (congestive) heart failure (HCC)   . Complication of anesthesia 01/2013   "didn't know where I was; who I was; talking out the top of my head" (04/21/2013)  . Concussion as a teenager   slight  . COPD (chronic obstructive pulmonary disease) (HCC)    wear oxygen at home  . Depression   . Diabetic neuropathy (HCC)   . GERD (gastroesophageal reflux disease)   . Hyperlipidemia   . Hypertension   . Hypothyroidism    takes Synthroid daily  . MRSA  (methicillin resistant staph aureus) culture positive    hx of 2012  . Neuromuscular disorder (HCC)   . OSA on CPAP    and oxygen  . Peripheral vascular disease (HCC)   . Staph infection 2007  . Symptomatic bradycardia 03/04/2014  . TIA (transient ischemic attack)   . Type II diabetes mellitus (HCC)     Past Surgical History:  Procedure Laterality Date  . BLADDER SURGERY  1970's   "stretched the mouth of my bladder" (04/21/2013)  . DILATION AND CURETTAGE OF UTERUS  1980's  . FOOT SURGERY Right 2012   "put pins in one year; amputated toes another OR" (04/21/2013)  . GANGLION CYST EXCISION Left 1990's   "wrist" (04/21/2013)  . I&D EXTREMITY Left 08/25/2013   Procedure: IRRIGATION AND DEBRIDEMENT EXTREMITY;  Surgeon: Axel Filler, MD;  Location: [facility] OR;  Service: General;  Laterality: Left;  . INCISION AND DRAINAGE ABSCESS N/A 01/24/2013   Procedure: INCISION AND DRAINAGE ABSCESS;  Surgeon: Clovis Pu. Cornett, MD;  Location: [facility] OR;  Service: General;  Laterality: N/A;  . INGUINAL HERNIA REPAIR N/A 01/28/2013   Procedure: I&D Right Groin Wound;  Surgeon: Shelly Rubenstein, MD;  Location: [facility] OR;  Service: General;  Laterality: N/A;  . LEG AMPUTATION BELOW KNEE Right 2012  . PERMANENT PACEMAKER INSERTION N/A 03/05/2014   Biotronik dual chamber pacemaker implanted by Dr Graciela Husbands for symptomatic bradycardia  . REFRACTIVE SURGERY Bilateral 2014  . STUMP REVISION Right 12/11/2014   Procedure: Right Below Knee Amputation Revision;  Surgeon: Nadara Mustard, MD;  Location: [facility] OR;  Service: Orthopedics;  Laterality: Right;  . TOE  AMPUTATION Left ?2011    only 2 toes remaning.   . TUBAL LIGATION  1990's  . WRIST SURGERY Right 1980's   "pinched nerve" (04/21/2013)    Social History   Social History  . Marital status: Single    Spouse name: N/A  . Number of children: N/A  . Years of education: N/A   Occupational History  . Not on file.   Social History Main Topics  . Smoking status: Current Every  Day Smoker    Packs/day: 0.50    Years: 33.00    Types: Cigarettes  . Smokeless tobacco: Never Used  . Alcohol use No  . Drug use: No  . Sexual activity: No   Other Topics Concern  . Not on file   Social History Narrative  . No narrative on file    Allergies  Allergen Reactions  . Erythromycin Diarrhea  . Iodine-131 Nausea And Vomiting  . Gadolinium Nausea And Vomiting     Code: VOM, Desc: Pt began vomiting immed post infusion of multihance, Onset Date: [id number]   . Ivp Dye [Iodinated Diagnostic Agents] Nausea And Vomiting       . Metformin Diarrhea  . Penicillins Hives    Has patient had a PCN reaction causing immediate rash, facial/tongue/throat swelling, SOB or lightheadedness with hypotension: No Has patient had a PCN reaction causing severe rash involving mucus membranes or skin necrosis: No Has patient had a PCN reaction that required hospitalization No Has patient had a PCN reaction occurring within the last 10 years: No If all of the above answers are "NO", then may proceed with Cephalosporin use.     Family History  Problem Relation Age of Onset  . Hypertension Mother   . Hyperlipidemia Mother   . Hypertrophic cardiomyopathy Mother        has pacer  . Alzheimer's disease Father   . Hypertension Brother   . Hyperlipidemia Brother   . Cancer Maternal Aunt   . Diabetes Paternal Aunt   . Diabetes Paternal Uncle   . Diabetes Paternal Grandmother   . Anesthesia problems Neg Hx   . Hypotension Neg Hx   . Malignant hyperthermia Neg Hx   . Pseudochol deficiency Neg Hx       Prior to Admission medications   Medication Sig Start Date End Date Taking? Authorizing Provider  furosemide (LASIX) 40 MG tablet Take 0.5 tablets (20 mg total) by mouth daily. 10/25/15  Yes Rolly Salter, MD  amLODipine (NORVASC) 10 MG tablet Take 1 tablet (10 mg total) by mouth daily. 12/08/15   Leroy Sea, MD  carvedilol (COREG) 3.125 MG tablet Take 1 tablet (3.125 mg total) by  mouth 2 (two) times daily with a meal. 10/25/15   Rolly Salter, MD  clopidogrel (PLAVIX) 75 MG tablet Take 1 tablet (75 mg total) by mouth daily. 07/24/16   Ghimire, Werner Lean, MD  doxycycline (VIBRA-TABS) 100 MG tablet Take 1 tablet (100 mg total) by mouth 2 (two) times daily. 04/05/17   Nadara Mustard, MD  EASY COMFORT LANCETS MISC test blood sugar THREE TIMES DAILY 09/23/16   [provider]  fenofibrate 160 MG tablet Take 1 tablet (160 mg total) by mouth daily. 07/24/16   Ghimire, Werner Lean, MD  FLUoxetine (PROZAC) 20 MG tablet Take 60 mg by mouth daily. 11/24/15   [provider]  insulin aspart (NOVOLOG FLEXPEN) 100 UNIT/ML FlexPen Inject 0-50 Units into the skin 3 (three) times daily with meals. Per sliding  scale    [provider]  insulin detemir (LEVEMIR) 100 UNIT/ML injection Inject 0.55 mLs (55 Units total) into the skin 2 (two) times daily. Patient taking differently: Inject 45 Units into the skin 2 (two) times daily.  12/07/15   Leroy SeaSingh, Prashant K, MD  levothyroxine (SYNTHROID, LEVOTHROID) 25 MCG tablet Take 2 tablets (50 mcg total) by mouth every morning. 10/25/15   Rolly SalterPatel, Pranav M, MD  lisinopril (PRINIVIL,ZESTRIL) 5 MG tablet Take 5 mg by mouth daily.    [provider]  meclizine (ANTIVERT) 25 MG tablet Take 1 tablet (25 mg total) by mouth 3 (three) times daily as needed for dizziness. 07/23/16   Ghimire, Werner LeanShanker M, MD  metoCLOPramide (REGLAN) 10 MG tablet Take 10 mg by mouth 4 (four) times daily. Take 1 tablet (10 mg) by mouth 30 minutes before meals and at bedtime 11/25/15   [provider]  omeprazole (PRILOSEC) 40 MG capsule Take 40 mg by mouth at bedtime. 11/22/15   [provider]  oxybutynin (DITROPAN-XL) 5 MG 24 hr tablet Take 10 mg by mouth daily.  11/22/15   [provider]  rosuvastatin (CRESTOR) 20 MG tablet Take 40 mg by mouth daily.  11/22/15   [provider]  silver sulfADIAZINE (SILVADENE) 1 % cream Apply to wound  daily 09/14/16   Adonis HugueninZamora, Erin R, NP  sulfamethoxazole-trimethoprim (BACTRIM DS,SEPTRA DS) 800-160 MG tablet Take 1 tablet by mouth 2 (two) times daily. 12/29/16   Adonis HugueninZamora, Erin R, NP    Physical Exam: Vitals:   06/18/17 0957 06/18/17 1125 06/18/17 1301 06/18/17 1500  BP: 117/86 (!) 108/50 (!) 122/58 125/63  Pulse: 83 65 71 69  Resp: 16 11 13 18   Temp: 98.2 F (36.8 C)     TempSrc: Oral     SpO2: 98% 97% 96% 95%  Weight: 97.5 kg (215 lb)     Height: 5\' 5"  (1.651 m)        General: Appears calm and comfortable Eyes:  PERRL, EOMI, normal lids, iris ENT:  grossly normal hearing, lips & tongue, mmm Neck:  no LAD, masses or thyromegaly Cardiovascular:  RRR, no m/r/g. No LE edema.  Respiratory:  CTA bilaterally, no w/r/r. Normal respiratory effort. Abdomen:  soft, ntnd, NABS Skin: L foot w/ removal of large portion of skin along the dorsal and lateral surface. surounding erythemia and induration present.  Musculoskeletal: R BKA noted and L foot digit toe amputations noted. grossly normal tone of BUE/BLE, good ROM, Psychiatric:  grossly normal mood and affect, speech fluent and appropriate, AOx3 Neurologic:  CN 2-12 grossly intact, moves all extremities in coordinated fashion, sensation intact  Labs on Admission: I have personally reviewed following labs and imaging studies  CBC:  Recent Labs Lab 06/18/17 1010  WBC 9.3  HGB 10.5*  HCT 30.3*  MCV 83.0  PLT 365   Basic Metabolic Panel:  Recent Labs Lab 06/18/17 1010  NA 125*  K 5.0  CL 87*  CO2 22  GLUCOSE 801*  BUN 44*  CREATININE 1.81*  CALCIUM 9.4   GFR: Estimated Creatinine Clearance: 40.6 mL/min (A) (by C-G formula based on SCr of 1.81 mg/dL (H)). Liver Function Tests: No results for input(s): AST, ALT, ALKPHOS, BILITOT, PROT, ALBUMIN in the last 168 hours. No results for input(s): LIPASE, AMYLASE in the last 168 hours. No results for input(s): AMMONIA in the last 168 hours. Coagulation Profile: No results for  input(s): INR, PROTIME in the last 168 hours. Cardiac Enzymes: No results for  input(s): CKTOTAL, CKMB, CKMBINDEX, TROPONINI in the last 168 hours. BNP (last 3 results) No results for input(s): PROBNP in the last 8760 hours. HbA1C: No results for input(s): HGBA1C in the last 72 hours. CBG:  Recent Labs Lab 06/18/17 0957 06/18/17 1523  GLUCAP >600* 590*   Lipid Profile: No results for input(s): CHOL, HDL, LDLCALC, TRIG, CHOLHDL, LDLDIRECT in the last 72 hours. Thyroid Function Tests: No results for input(s): TSH, T4TOTAL, FREET4, T3FREE, THYROIDAB in the last 72 hours. Anemia Panel: No results for input(s): VITAMINB12, FOLATE, FERRITIN, TIBC, IRON, RETICCTPCT in the last 72 hours. Urine analysis:    Component Value Date/Time   COLORURINE YELLOW 06/18/2017 1310   APPEARANCEUR HAZY (A) 06/18/2017 1310   LABSPEC 1.018 06/18/2017 1310   PHURINE 5.0 06/18/2017 1310   GLUCOSEU >=500 (A) 06/18/2017 1310   HGBUR MODERATE (A) 06/18/2017 1310   HGBUR negative 03/25/2010 1147   BILIRUBINUR NEGATIVE 06/18/2017 1310   KETONESUR NEGATIVE 06/18/2017 1310   PROTEINUR 30 (A) 06/18/2017 1310   UROBILINOGEN 0.2 12/07/2014 2125   NITRITE NEGATIVE 06/18/2017 1310   LEUKOCYTESUR TRACE (A) 06/18/2017 1310    Creatinine Clearance: Estimated Creatinine Clearance: 40.6 mL/min (A) (by C-G formula based on SCr of 1.81 mg/dL (H)).  Sepsis Labs: @LABRCNTIP (procalcitonin:4,lacticidven:4) )No results found for this or any previous visit (from the past 240 hour(s)).   Radiological Exams on Admission: Dg Foot Complete Left  Result Date: 06/18/2017 CLINICAL DATA:  Open foot wounds.  Evaluate for osteomyelitis. EXAM: LEFT FOOT - COMPLETE 3+ VIEW COMPARISON:  Left foot x-rays dated December 07, 2014. FINDINGS: Postsurgical changes related to prior amputation of the great toe and first metatarsal, as well as the second and third toes. No acute fracture or malalignment. No definite osteolysis or cortical  destruction. Mild chronic cortical thickening of the second metatarsal shaft is unchanged. Severe midfoot degenerative changes are again noted. Diffuse soft tissue swelling of the forefoot with focal lucency at along the plantar aspect at the base of the medial cuneiform. IMPRESSION: 1. Plantar ulceration at the base of the medial cuneiform with diffuse soft tissue swelling of the forefoot, consistent with cellulitis. No radiographic evidence of osteomyelitis. 2. Stable midfoot neuropathic changes and postsurgical changes from prior amputations. Electronically Signed   By: December 09, 2014 M.D.   On: 06/18/2017 12:46     Assessment/Plan Active Problems:   Diabetes mellitus type 2, uncontrolled, with complications (HCC)   Cellulitis   Chronic diastolic heart failure (HCC)   Diabetic foot ulcer (HCC)   Hyperglycemia   CKD (chronic kidney disease), stage III (HCC)   GERD (gastroesophageal reflux disease)   Abnormal urinalysis   Diabetic gastroparesis (HCC)   History of TIA (transient ischemic attack)   L foot cellulitits: likely to need further amputation of digits or midfoot/foot. Followed in the outpt setting by Dr. 06/20/2017 who has performed other amputations. DG w/o sign of osteomyelitis.  - Vanc/Cef - Dr Lajoyce Corners to see in consultation  Hyperglycemia/DM: pt w/ poorly controlled DM. Glucose on admission 801. Pt states baseline glucose is 300-400 w/ her last outpt A1c of 14. Discussed improtance of better glucose control and the potential lethality of her condition w/o better control. Pt seemed apathetic to concerns raised over her overall health and DM control.  - DM educator - SSI  CKD: Cr 1.8. Possible AKI but no values in computer to compare since 07/2016 when CKD was 1.3 and 1.1 the month before that.  - IVF - BMET in am  Abnormal  UA: asymptomatic. - UCX  Pseudohyponatremia: 125. Corrected due to hyperglycemia to 136.  - BMET 2200 and in am. - correction of glucose as above.   HTN: -  contineu lisinopril, coreg, lasix  Chronic diastolic congestive heart failure:: last Echo w/ EF 60% and diastolic dysfunction - continue lasix, coreg  Gastroparesis: - contineu reglan  Depression:  - continue prozac  H/o TIA: - continue statin, plavix  Hypothyroid: - continue synthroid  Bladder spasms: - continue oxybutynin  HLD: - continue crestor   DVT prophylaxis: heparin  Code Status: full  Family Communication: none  Disposition Plan: pending eval byDr. Lajoyce Corners and possible amputation  Consults called: Dr. Lajoyce Corners  Admission status: inpt    Ozella Rocks MD [facility]  If 7PM-7AM, please contact night-coverage [email or web address] Password [email or web address]  06/18/2017, 4:07 PM

## 2017-06-18 NOTE — ED Notes (Signed)
Blood glucose 801

## 2017-06-18 NOTE — ED Notes (Signed)
Pt found to be wet; personal hygiene performed; bottom sheet and chucks changed; pt given new cup of ice water; no other complaints at this time

## 2017-06-18 NOTE — Progress Notes (Signed)
Pharmacy Antibiotic Note  [name] is a 56 y.o. female admitted on 06/18/2017 with left foot wound.  Pharmacy has been consulted for vancomycin and cefepime dosing. AKI noted with Cr 1.8 and CrCl ~ 40 ml/min.   Vancomycin trough goal 15-20 (until osteo ruled out, xray pending)  Plan: 1) Vancomycin 1500mg  IV x 1 then 1250mg  IV q24 2) Cefepime 1g IV q24 3) Follow renal function, cultures, LOT, level as needed  Height: 5\' 5"  (165.1 cm) Weight: 215 lb (97.5 kg) IBW/kg (Calculated) : 57  Temp (24hrs), Avg:98.2 F (36.8 C), Min:98.2 F (36.8 C), Max:98.2 F (36.8 C)   Recent Labs Lab 06/18/17 1010  WBC 9.3  CREATININE 1.81*    Estimated Creatinine Clearance: 40.6 mL/min (A) (by C-G formula based on SCr of 1.81 mg/dL (H)).    Allergies  Allergen Reactions  . Erythromycin Diarrhea  . Iodine-131 Nausea And Vomiting  . Gadolinium Nausea And Vomiting     Code: VOM, Desc: Pt began vomiting immed post infusion of multihance, Onset Date:   . Ivp Dye [Iodinated Diagnostic Agents] Nausea And Vomiting       . Metformin Diarrhea  . Penicillins Hives    Has patient had a PCN reaction causing immediate rash, facial/tongue/throat swelling, SOB or lightheadedness with hypotension: No Has patient had a PCN reaction causing severe rash involving mucus membranes or skin necrosis: No Has patient had a PCN reaction that required hospitalization No Has patient had a PCN reaction occurring within the last 10 years: No If all of the above answers are "NO", then may proceed with Cephalosporin use.     Antimicrobials this admission: 10/29 Vancomycin >> 10/29 Cefepime >>  Dose adjustments this admission: n/a  Microbiology results: 10/29 blood cx >>  Thank you for allowing pharmacy to be a part of this patient's care.  11/29 06/18/2017 11:48 AM

## 2017-06-18 NOTE — ED Provider Notes (Signed)
[facility] EMERGENCY DEPARTMENT Provider Note   CSN: [id number] Arrival date & time: 06/18/17  0946     History   Chief Complaint Chief Complaint  Patient presents with  . Wound Infection    HPI [name] is a 56 y.o. female.  The history is provided by the patient and medical records. No language interpreter was used.   [name] is a 56 y.o. female  with a complex medical hx as listed below including DM2 who presents to the Emergency Department complaining of bleeding and drainage from left foot wound. Patient denies known injury or trauma. She states that she first noticed some mild bleeding yesterday, but symptoms worsened this morning. She noticed swelling and pain when she took her sock off this morning. Denies fever/chills. No medications other than daily meds taken this morning, however she states that she did not take her DM meds this morning. She states that her blood sugar typically runs in the 300's-400's. Today glucose of 801 which she states is unusual for her. She did not know that blood sugar was this high upon arrival. Denies abdominal pain, n/v. Does feel tired and thirsty. Denies urinary symptoms.   Past Medical History:  Diagnosis Date  . Anxiety   . Cellulitis   . Complication of anesthesia 01/2013   "didn't know where I was; who I was; talking out the top of my head" (04/21/2013)  . Concussion as a teenager   slight  . COPD (chronic obstructive pulmonary disease) (HCC)    wear oxygen at home  . Depression   . Diabetic neuropathy (HCC)   . GERD (gastroesophageal reflux disease)   . Hyperlipidemia   . Hypertension   . Hypothyroidism    takes Synthroid daily  . MRSA (methicillin resistant staph aureus) culture positive    hx of 2012  . Neuromuscular disorder (HCC)   . OSA on CPAP    and oxygen  . Peripheral vascular disease (HCC)   . Staph infection 2007  . Symptomatic bradycardia 03/04/2014  . TIA (transient ischemic  attack)   . Type II diabetes mellitus [facility])     Patient Active Problem List   Diagnosis Date Noted  . Hyperlipidemia 04/10/2017  . Acquired absence of right leg below knee (HCC) 11/29/2016  . Non-pressure chronic ulcer of other part of left foot limited to breakdown of skin (HCC) 10/10/2016  . Left facial numbness   . Cerebrovascular disease   . Diabetic polyneuropathy associated with type 2 diabetes mellitus (HCC)   . Chronic nonintractable headache   . GERD (gastroesophageal reflux disease) 07/21/2016  . TIA (transient ischemic attack) 07/04/2016  . Hyperkalemia 12/07/2015  . Herpes simplex labialis   . Altered mental status   . Thrush 10/20/2015  . HSV-1 (herpes simplex virus 1) infection   . Hallucinations   . Acute respiratory failure with hypoxia (HCC) 10/14/2015  . Hyperglycemia 10/13/2015  . Diabetes mellitus due to underlying condition with hyperosmolarity without nonketotic hyperglycemic-hyperosmolar coma [facility]) (HCC) 10/13/2015  . CKD (chronic kidney disease), stage III (HCC) 10/13/2015  . AKI (acute kidney injury) (HCC) 10/13/2015  . Diastolic CHF (HCC) 10/13/2015  . Acute encephalopathy 10/13/2015  . 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 Lactic acid acidosis   . Diabetic foot ulcer (HCC) 12/08/2014  . Symptomatic bradycardia 03/04/2014  . OSA on CPAP with oxygen 03/04/2014  . COPD (chronic obstructive pulmonary disease), on home O2 03/04/2014  . Bradycardia 03/04/2014  . Open wound  of knee, leg (except thigh), and ankle, complicated 09/17/2013  . Cellulitis 08/16/2013  . Chronic diastolic heart failure (HCC) 08/16/2013  . Sepsis (HCC) 08/16/2013  . SOB (shortness of breath) 04/20/2013  . Proteinuria 01/26/2013  . Acute on chronic kidney failure (HCC) 01/24/2013  . Abscess of groin, right 01/23/2013  . HTN (hypertension) 01/23/2013  . Leukocytosis 01/23/2013  . Anemia of chronic disease 01/23/2013  . Hypothyroidism 01/23/2013  . Hyponatremia  10/06/2011  . HYPERCHOLESTEROLEMIA 08/17/2010  . Depression 02/01/2010  . TOBACCO ABUSE 06/04/2009  . Diabetes mellitus type 2, uncontrolled, with complications (HCC) 08/06/2007    Past Surgical History:  Procedure Laterality Date  . BLADDER SURGERY  1970's   "stretched the mouth of my bladder" (04/21/2013)  . DILATION AND CURETTAGE OF UTERUS  1980's  . FOOT SURGERY Right 2012   "put pins in one year; amputated toes another OR" (04/21/2013)  . GANGLION CYST EXCISION Left 1990's   "wrist" (04/21/2013)  . I&D EXTREMITY Left 08/25/2013   Procedure: IRRIGATION AND DEBRIDEMENT EXTREMITY;  Surgeon: Axel FillerArmando Ramirez, MD;  Location: [facility] OR;  Service: General;  Laterality: Left;  . INCISION AND DRAINAGE ABSCESS N/A 01/24/2013   Procedure: INCISION AND DRAINAGE ABSCESS;  Surgeon: Clovis Puhomas A. Cornett, MD;  Location: [facility] OR;  Service: General;  Laterality: N/A;  . INGUINAL HERNIA REPAIR N/A 01/28/2013   Procedure: I&D Right Groin Wound;  Surgeon: Shelly Rubensteinouglas A Blackman, MD;  Location: [facility] OR;  Service: General;  Laterality: N/A;  . LEG AMPUTATION BELOW KNEE Right 2012  . PERMANENT PACEMAKER INSERTION N/A 03/05/2014   Biotronik dual chamber pacemaker implanted by Dr Graciela HusbandsKlein for symptomatic bradycardia  . REFRACTIVE SURGERY Bilateral 2014  . STUMP REVISION Right 12/11/2014   Procedure: Right Below Knee Amputation Revision;  Surgeon: Nadara MustardMarcus Duda V, MD;  [facility];  Service: Orthopedics;  Laterality: Right;  . TOE AMPUTATION Left ?2011    only 2 toes remaning.   . TUBAL LIGATION  1990's  . WRIST SURGERY Right 1980's   "pinched nerve" (04/21/2013)    OB History    No data available       Home Medications    Prior to Admission medications   Medication Sig Start Date End Date Taking? Authorizing Provider  furosemide (LASIX) 40 MG tablet Take 0.5 tablets (20 mg total) by mouth daily. 10/25/15  Yes Rolly SalterPatel, Pranav M, MD  amLODipine (NORVASC) 10 MG tablet Take 1 tablet (10 mg total) by mouth daily. 12/08/15   Leroy SeaSingh,  Prashant K, MD  carvedilol (COREG) 3.125 MG tablet Take 1 tablet (3.125 mg total) by mouth 2 (two) times daily with a meal. 10/25/15   Rolly SalterPatel, Pranav M, MD  clopidogrel (PLAVIX) 75 MG tablet Take 1 tablet (75 mg total) by mouth daily. 07/24/16   Ghimire, Werner LeanShanker M, MD  doxycycline (VIBRA-TABS) 100 MG tablet Take 1 tablet (100 mg total) by mouth 2 (two) times daily. 04/05/17   Nadara Mustarduda, Marcus V, MD  EASY COMFORT LANCETS MISC test blood sugar THREE TIMES DAILY 09/23/16   [provider]  fenofibrate 160 MG tablet Take 1 tablet (160 mg total) by mouth daily. 07/24/16   Ghimire, Werner LeanShanker M, MD  FLUoxetine (PROZAC) 20 MG tablet Take 60 mg by mouth daily. 11/24/15   [provider]  insulin aspart (NOVOLOG FLEXPEN) 100 UNIT/ML FlexPen Inject 0-50 Units into the skin 3 (three) times daily with meals. Per sliding scale    [provider]  insulin detemir (LEVEMIR) 100 UNIT/ML injection Inject 0.55  mLs (55 Units total) into the skin 2 (two) times daily. Patient taking differently: Inject 45 Units into the skin 2 (two) times daily.  12/07/15   Leroy Sea, MD  levothyroxine (SYNTHROID, LEVOTHROID) 25 MCG tablet Take 2 tablets (50 mcg total) by mouth every morning. 10/25/15   Rolly Salter, MD  lisinopril (PRINIVIL,ZESTRIL) 5 MG tablet Take 5 mg by mouth daily.    [provider]  meclizine (ANTIVERT) 25 MG tablet Take 1 tablet (25 mg total) by mouth 3 (three) times daily as needed for dizziness. 07/23/16   Ghimire, Werner Lean, MD  metoCLOPramide (REGLAN) 10 MG tablet Take 10 mg by mouth 4 (four) times daily. Take 1 tablet (10 mg) by mouth 30 minutes before meals and at bedtime 11/25/15   [provider]  omeprazole (PRILOSEC) 40 MG capsule Take 40 mg by mouth at bedtime. 11/22/15   [provider]  oxybutynin (DITROPAN-XL) 5 MG 24 hr tablet Take 10 mg by mouth daily.  11/22/15   [provider]  rosuvastatin (CRESTOR) 20 MG tablet Take 40 mg by mouth daily.  11/22/15    [provider]  silver sulfADIAZINE (SILVADENE) 1 % cream Apply to wound daily 09/14/16   Adonis Huguenin, NP  sulfamethoxazole-trimethoprim (BACTRIM DS,SEPTRA DS) 800-160 MG tablet Take 1 tablet by mouth 2 (two) times daily. 12/29/16   Adonis Huguenin, NP    Family History Family History  Problem Relation Age of Onset  . Hypertension Mother   . Hyperlipidemia Mother   . Hypertrophic cardiomyopathy Mother        has pacer  . Alzheimer's disease Father   . Hypertension Brother   . Hyperlipidemia Brother   . Cancer Maternal Aunt   . Diabetes Paternal Aunt   . Diabetes Paternal Uncle   . Diabetes Paternal Grandmother   . Anesthesia problems Neg Hx   . Hypotension Neg Hx   . Malignant hyperthermia Neg Hx   . Pseudochol deficiency Neg Hx     Social History Social History  Substance Use Topics  . Smoking status: Current Every Day Smoker    Packs/day: 0.50    Years: 33.00    Types: Cigarettes  . Smokeless tobacco: Never Used  . Alcohol use No     Allergies   Erythromycin; Iodine-131; Gadolinium; Ivp dye [iodinated diagnostic agents]; Metformin; and Penicillins   Review of Systems Review of Systems  Constitutional: Negative for fever.  Musculoskeletal: Positive for arthralgias.  Skin: Positive for color change and wound.  Allergic/Immunologic: Positive for immunocompromised state (DM2).  All other systems reviewed and are negative.    Physical Exam Updated Vital Signs BP (!) 122/58   Pulse 71   Temp 98.2 F (36.8 C) (Oral)   Resp 13   Ht 5\' 5"  (1.651 m)   Wt 97.5 kg (215 lb)   SpO2 96%   BMI 35.78 kg/m   Physical Exam  Constitutional: She is oriented to person, place, and time. She appears well-developed and well-nourished. No distress.  HENT:  Head: Normocephalic and atraumatic.  Cardiovascular: Normal rate, regular rhythm and normal heart sounds.   No murmur heard. Pulmonary/Chest: Effort normal and breath sounds normal. No respiratory distress.   Abdominal: Soft. She exhibits no distension. There is no tenderness.  Musculoskeletal: She exhibits no edema.  Neurological: She is alert and oriented to person, place, and time.  Skin: Skin is warm and dry.  See image of left foot below: tender to palpation.  Nursing  note and vitals reviewed.          ED Treatments / Results  Labs (all labs ordered are listed, but only abnormal results are displayed) Labs Reviewed  BASIC METABOLIC PANEL - Abnormal; Notable for the following:       Result Value   Sodium 125 (*)    Chloride 87 (*)    Glucose, Bld 801 (*)    BUN 44 (*)    Creatinine, Ser 1.81 (*)    GFR calc non Af Amer 30 (*)    GFR calc Af Amer 35 (*)    Anion gap 16 (*)    All other components within normal limits  CBC - Abnormal; Notable for the following:    RBC 3.65 (*)    Hemoglobin 10.5 (*)    HCT 30.3 (*)    All other components within normal limits  URINALYSIS, ROUTINE W REFLEX MICROSCOPIC - Abnormal; Notable for the following:    APPearance HAZY (*)    Glucose, UA >=500 (*)    Hgb urine dipstick MODERATE (*)    Protein, ur 30 (*)    Leukocytes, UA TRACE (*)    Bacteria, UA FEW (*)    Squamous Epithelial / LPF 0-5 (*)    All other components within normal limits  CBG MONITORING, ED - Abnormal; Notable for the following:    Glucose-Capillary >600 (*)    All other components within normal limits  CULTURE, BLOOD (ROUTINE X 2)  CULTURE, BLOOD (ROUTINE X 2)    EKG  EKG Interpretation None       Radiology Dg Foot Complete Left  Result Date: 06/18/2017 CLINICAL DATA:  Open foot wounds.  Evaluate for osteomyelitis. EXAM: LEFT FOOT - COMPLETE 3+ VIEW COMPARISON:  Left foot x-rays dated December 07, 2014. FINDINGS: Postsurgical changes related to prior amputation of the great toe and first metatarsal, as well as the second and third toes. No acute fracture or malalignment. No definite osteolysis or cortical destruction. Mild chronic cortical thickening of  the second metatarsal shaft is unchanged. Severe midfoot degenerative changes are again noted. Diffuse soft tissue swelling of the forefoot with focal lucency at along the plantar aspect at the base of the medial cuneiform. IMPRESSION: 1. Plantar ulceration at the base of the medial cuneiform with diffuse soft tissue swelling of the forefoot, consistent with cellulitis. No radiographic evidence of osteomyelitis. 2. Stable midfoot neuropathic changes and postsurgical changes from prior amputations. Electronically Signed   By: Obie Dredge M.D.   On: 06/18/2017 12:46    Procedures Procedures (including critical care time)  Medications Ordered in ED Medications  vancomycin (VANCOCIN) 1,500 mg in sodium chloride 0.9 % 500 mL IVPB (1,500 mg Intravenous New Bag/Given 06/18/17 1333)  vancomycin (VANCOCIN) 1,250 mg in sodium chloride 0.9 % 250 mL IVPB (not administered)  ceFEPIme (MAXIPIME) 1 g in dextrose 5 % 50 mL IVPB (0 g Intravenous Stopped 06/18/17 1402)  sodium chloride 0.9 % bolus 2,000 mL (2,000 mLs Intravenous New Bag/Given 06/18/17 1311)     Initial Impression / Assessment and Plan / ED Course  I have reviewed the triage vital signs and the nursing notes.  Pertinent labs & imaging results that were available during my care of the patient were reviewed by me and considered in my medical decision making (see chart for details).    LEAN JAEGER is a 56 y.o. female who presents to ED for pain and discharge from left foot. Exam c/w cellulitis. Hx of  MRSA in the past along with uncontrolled DM. PCN allergy. Started on vanc and cefepime. White count normal. Afebrile and hemodynamically stable. X-ray with no signs of osteo.   Additionally, patient noted to be hyperglycemic with blood sugar of 801. No ketones in the urine. Co2 22. AG mildly elevated at 16. Doubt DKA. Fluids given.   Hospitalist consulted who will admit.   Patient discussed with Dr. Clayborne Dana who agrees with treatment plan.     Final Clinical Impressions(s) / ED Diagnoses   Final diagnoses:  Hyperglycemia    New Prescriptions New Prescriptions   No medications on file     Ward, Chase Picket, PA-C 06/18/17 1507    Mesner, Barbara Cower, MD 06/18/17 1610

## 2017-06-18 NOTE — ED Notes (Signed)
Please note that blood culture was drawn before abt was started

## 2017-06-18 NOTE — ED Notes (Signed)
Pt notified that urine sample is due. IV team is at the bedside

## 2017-06-18 NOTE — ED Notes (Signed)
Attempt IV start x2 (in right anterior forearm) without success.  Second RN in the room to attempt IV start

## 2017-06-18 NOTE — ED Triage Notes (Signed)
Per [facility], Pt is coming from home with complaints of weeping and bleeding to left wound on her foot after taking off socks. Bleeding controlled. EMS Noted high blood sugar. Pt states she is compliant. Denies any symptoms. Vitals per EMS: 106/60, 77 HR, 99% on RA.

## 2017-06-19 ENCOUNTER — Other Ambulatory Visit (INDEPENDENT_AMBULATORY_CARE_PROVIDER_SITE_OTHER): Payer: Self-pay | Admitting: Orthopedic Surgery

## 2017-06-19 ENCOUNTER — Encounter (HOSPITAL_COMMUNITY): Payer: Self-pay | Admitting: General Practice

## 2017-06-19 DIAGNOSIS — R739 Hyperglycemia, unspecified: Secondary | ICD-10-CM

## 2017-06-19 DIAGNOSIS — L03115 Cellulitis of right lower limb: Secondary | ICD-10-CM

## 2017-06-19 DIAGNOSIS — L02612 Cutaneous abscess of left foot: Secondary | ICD-10-CM

## 2017-06-19 LAB — COMPREHENSIVE METABOLIC PANEL
ALK PHOS: 87 U/L (ref 38–126)
ALT: 11 U/L — AB (ref 14–54)
AST: 11 U/L — AB (ref 15–41)
Albumin: 2.2 g/dL — ABNORMAL LOW (ref 3.5–5.0)
Anion gap: 8 (ref 5–15)
BUN: 33 mg/dL — AB (ref 6–20)
CALCIUM: 8.9 mg/dL (ref 8.9–10.3)
CHLORIDE: 99 mmol/L — AB (ref 101–111)
CO2: 25 mmol/L (ref 22–32)
CREATININE: 1.44 mg/dL — AB (ref 0.44–1.00)
GFR calc Af Amer: 46 mL/min — ABNORMAL LOW (ref >60)
GFR calc non Af Amer: 40 mL/min — ABNORMAL LOW (ref >60)
Glucose, Bld: 406 mg/dL — ABNORMAL HIGH (ref 65–99)
Potassium: 4.7 mmol/L (ref 3.5–5.1)
SODIUM: 132 mmol/L — AB (ref 135–145)
Total Bilirubin: 0.3 mg/dL (ref 0.3–1.2)
Total Protein: 6 g/dL — ABNORMAL LOW (ref 6.5–8.1)

## 2017-06-19 LAB — URINE CULTURE: Culture: NO GROWTH

## 2017-06-19 LAB — MRSA PCR SCREENING: MRSA BY PCR: NEGATIVE

## 2017-06-19 LAB — CBC
HCT: 26.3 % — ABNORMAL LOW (ref 36.0–46.0)
HEMOGLOBIN: 8.6 g/dL — AB (ref 12.0–15.0)
MCH: 27.1 pg (ref 26.0–34.0)
MCHC: 32.7 g/dL (ref 30.0–36.0)
MCV: 83 fL (ref 78.0–100.0)
PLATELETS: 245 10*3/uL (ref 150–400)
RBC: 3.17 MIL/uL — AB (ref 3.87–5.11)
RDW: 12.7 % (ref 11.5–15.5)
WBC: 5.7 10*3/uL (ref 4.0–10.5)

## 2017-06-19 LAB — GLUCOSE, CAPILLARY
GLUCOSE-CAPILLARY: 372 mg/dL — AB (ref 65–99)
GLUCOSE-CAPILLARY: 385 mg/dL — AB (ref 65–99)
GLUCOSE-CAPILLARY: 398 mg/dL — AB (ref 65–99)
Glucose-Capillary: 385 mg/dL — ABNORMAL HIGH (ref 65–99)
Glucose-Capillary: 301 mg/dL — ABNORMAL HIGH (ref 65–99)

## 2017-06-19 LAB — HIV ANTIBODY (ROUTINE TESTING W REFLEX): HIV Screen 4th Generation wRfx: NONREACTIVE

## 2017-06-19 LAB — HEMOGLOBIN A1C
Hgb A1c MFr Bld: >15.5 % — ABNORMAL HIGH (ref 4.8–5.6)
Mean Plasma Glucose: >398 mg/dL

## 2017-06-19 MED ORDER — SODIUM CHLORIDE 0.9 % IV BOLUS (SEPSIS)
500.0000 mL | Freq: Once | INTRAVENOUS | Status: AC
Start: 2017-06-19 — End: 2017-06-19
  Administered 2017-06-19: 500 mL via INTRAVENOUS

## 2017-06-19 MED ORDER — VANCOMYCIN HCL IN DEXTROSE 750-5 MG/150ML-% IV SOLN
750.0000 mg | Freq: Two times a day (BID) | INTRAVENOUS | Status: DC
  Administered 2017-06-20 – 2017-06-22 (×5): 750 mg via INTRAVENOUS
  Filled 2017-06-19 (×6): qty 150

## 2017-06-19 MED ORDER — INSULIN GLARGINE 100 UNIT/ML ~~LOC~~ SOLN
20.0000 [IU] | Freq: Once | SUBCUTANEOUS | Status: AC
  Administered 2017-06-19: 20 [IU] via SUBCUTANEOUS
  Filled 2017-06-19: qty 0.2

## 2017-06-19 MED ORDER — DEXTROSE 5 % IV SOLN
1.0000 g | Freq: Two times a day (BID) | INTRAVENOUS | Status: AC
  Administered 2017-06-19 – 2017-06-23 (×7): 1 g via INTRAVENOUS
  Filled 2017-06-19 (×8): qty 1

## 2017-06-19 MED ORDER — INSULIN ASPART 100 UNIT/ML ~~LOC~~ SOLN
5.0000 [IU] | Freq: Once | SUBCUTANEOUS | Status: AC
  Administered 2017-06-19: 5 [IU] via SUBCUTANEOUS

## 2017-06-19 MED ORDER — INSULIN ASPART 100 UNIT/ML ~~LOC~~ SOLN
10.0000 [IU] | Freq: Once | SUBCUTANEOUS | Status: AC
  Administered 2017-06-19: 10 [IU] via SUBCUTANEOUS

## 2017-06-19 MED ORDER — INSULIN GLARGINE 100 UNIT/ML ~~LOC~~ SOLN
30.0000 [IU] | Freq: Every day | SUBCUTANEOUS | Status: DC
  Administered 2017-06-20: 30 [IU] via SUBCUTANEOUS
  Filled 2017-06-19: qty 0.3

## 2017-06-19 MED ORDER — INSULIN GLARGINE 100 UNIT/ML ~~LOC~~ SOLN
10.0000 [IU] | Freq: Every day | SUBCUTANEOUS | Status: DC
  Administered 2017-06-19: 10 [IU] via SUBCUTANEOUS
  Filled 2017-06-19: qty 0.1

## 2017-06-19 MED ORDER — LORAZEPAM 1 MG PO TABS: 1.0000 mg | ORAL_TABLET | Freq: Four times a day (QID) | ORAL | Status: DC | PRN

## 2017-06-19 MED ORDER — INSULIN ASPART 100 UNIT/ML ~~LOC~~ SOLN
5.0000 [IU] | Freq: Three times a day (TID) | SUBCUTANEOUS | Status: DC
  Administered 2017-06-19 – 2017-06-20 (×3): 5 [IU] via SUBCUTANEOUS

## 2017-06-19 NOTE — Progress Notes (Signed)
CRITICAL VALUE ALERT  Critical Value:  Blood glucose 584  Date & Time Notied:  06/19/17 at 12:16  Provider Notified: schorr  Orders Received/Actions taken: insulin novolog 10 unit sq and NS 500 ml bolus.

## 2017-06-19 NOTE — Progress Notes (Signed)
Patient ID: [name], female   DOB: 07-10-1961, 56 y.o.   MRN: [id number] Patient cellulitis and swelling is stable in the left foot.  Patient will require a transtibial amputation on the left.  Will plan for surgery on Friday morning.

## 2017-06-19 NOTE — Progress Notes (Signed)
Pharmacy Antibiotic Note  [name] is a 56 y.o. female admitted on 06/18/2017 with left foot wound.  Pharmacy has been consulted for vancomycin and cefepime dosing. Initially presented with AKI noted with Cr 1.8 and CrCl ~ 40 ml/min.  Renal function has improved today to baseline (SCr ~ 1.4 and CeCl ~ 50).  Will adjust abx doses.  Vancomycin trough goal 15-20 (until osteo ruled out, xray pending)  Plan: 1) Change Vancomycin 750mg  IV q12h 2) Change Cefepime 1g IV q12h 3) Follow renal function, cultures, LOT, level as needed  Height: 5\' 5"  (165.1 cm) Weight: 215 lb (97.5 kg) IBW/kg (Calculated) : 57  Temp (24hrs), Avg:98.7 F (37.1 C), Min:98.5 F (36.9 C), Max:98.8 F (37.1 C)   Recent Labs Lab 06/18/17 1010 06/18/17 2240 06/19/17 0601  WBC 9.3  --  5.7  CREATININE 1.81* 1.64* 1.44*    Estimated Creatinine Clearance: 51 mL/min (A) (by C-G formula based on SCr of 1.44 mg/dL (H)).    Allergies  Allergen Reactions  . Erythromycin Diarrhea  . Iodine-131 Nausea And Vomiting  . Gadolinium Nausea And Vomiting     Code: VOM, Desc: Pt began vomiting immed post infusion of multihance, Onset Date: 06/20/17   . Ivp Dye [Iodinated Diagnostic Agents] Nausea And Vomiting       . Metformin Diarrhea  . Penicillins Hives    Has patient had a PCN reaction causing immediate rash, facial/tongue/throat swelling, SOB or lightheadedness with hypotension: No Has patient had a PCN reaction causing severe rash involving mucus membranes or skin necrosis: No Has patient had a PCN reaction that required hospitalization No Has patient had a PCN reaction occurring within the last 10 years: No If all of the above answers are "NO", then may proceed with Cephalosporin use.     Antimicrobials this admission: 10/29 Vancomycin >> 10/29 Cefepime >>  Dose adjustments this admission: 10/30 dose changed with improved renal function  Microbiology results: 10/29 blood cx >>  Thank you for  allowing pharmacy to be a part of this patient's care.  11/30, Pharm.D., BCPS Clinical Pharmacist Pager: [phone] Clinical phone for 06/19/2017 from 8:30-4:00 is [phone]. After 4pm, please call Main Rx [phone]) for assistance. 06/19/2017 1:34 PM

## 2017-06-19 NOTE — Progress Notes (Signed)
Pt's BP is 114/45. MD Danford made aware. Will continue to assess.

## 2017-06-19 NOTE — Progress Notes (Signed)
Inpatient Diabetes Program Recommendations  AACE/ADA: New Consensus Statement on Inpatient Glycemic Control (2015)  Target Ranges:  Prepandial:   less than 140 mg/dL      Peak postprandial:   less than 180 mg/dL (1-2 hours)      Critically ill patients:  140 - 180 mg/dL   Lab Results  Component Value Date   GLUCAP 385 (H) 06/19/2017   HGBA1C >15.5 (H) 06/18/2017    Review of Glycemic Control  Inpatient Diabetes Program Recommendations:   Spoke with pt about A1C results 15.5 with them and explained what an A1C is, basic pathophysiology of DM Type 2, basic home care, basic diabetes diet nutrition principles, importance of checking CBGs and maintaining good CBG control to prevent long-term and short-term complications. Reviewed signs and symptoms of hyperglycemia and hypoglycemia and how to treat hypoglycemia at home. Also reviewed blood sugar goals at home.  RNs to provide ongoing basic DM education at bedside with this patient.  Thank you, [name]. Kamal Jurgens, RN, MSN, CDE  Diabetes Coordinator Inpatient Glycemic Control Team Team Pager [phone] (8am-5pm) 06/19/2017 2:47 PM

## 2017-06-19 NOTE — Progress Notes (Signed)
Inpatient Diabetes Program Recommendations  AACE/ADA: New Consensus Statement on Inpatient Glycemic Control (2015)  Target Ranges:  Prepandial:   less than 140 mg/dL      Peak postprandial:   less than 180 mg/dL (1-2 hours)      Critically ill patients:  140 - 180 mg/dL   Lab Results  Component Value Date   GLUCAP 385 (H) 06/19/2017   HGBA1C 12.6 (H) 07/05/2016    Review of Glycemic Control Results for [name], [name] (MRN [id number]) as of 06/19/2017 10:23  Ref. Range 06/18/2017 09:57 06/18/2017 15:23 06/18/2017 17:51 06/18/2017 22:53 06/19/2017 04:12  Glucose-Capillary Latest Ref Range: 65 - 99 mg/dL >937 (HH) 169 (HH) 678 (H) 536 (HH) 385 (H)   Diabetes history: DM2 Outpatient Diabetes medications: Novolin R 60 units am + 50 units lunch + 30 units pm Current orders for Inpatient glycemic control: Lantus 10 units + Novolog resistant correction tid + 0-5 units hs  Inpatient Diabetes Program Recommendations:  Patient has received 35 units Novolog correction over the past 12 hrs. Please consider: -Increase Lantus to 30 units daily (give additional 20 units this am) -Add Novolog 5 units tid meal coverage when eating 50%  On D/C, patient may benefit by changing to Novolin 70/30 insulin mix bid to allow for more basal coverage along with meal coverage.  Thank you, Billy Fischer. Gregg Winchell, RN, MSN, CDE  Diabetes Coordinator Inpatient Glycemic Control Team Team Pager [phone] (8am-5pm) 06/19/2017 10:32 AM

## 2017-06-19 NOTE — Progress Notes (Signed)
PROGRESS NOTE    [name]  FXT:[id number] DOB: 1961-08-21 DOA: 06/18/2017 PCP: Loyal Jacobson, MD      Brief Narrative:  56 year old female with past medical history significant for IDDM, poorly controlled, right BKA, chronic diastolic CHF, gastroparesis, and cerebrovascular disease who presents with left foot cellulitis.  Was evaluated in ER by Dr. Lajoyce Corners who recommends left BKA on Friday with pre-operative broad spectrum antibiotics.   Assessment & Plan:  Active Problems:   Diabetes mellitus type 2, uncontrolled, with complications (HCC)   Cellulitis   Chronic diastolic heart failure (HCC)   Diabetic foot ulcer (HCC)   Hyperglycemia   CKD (chronic kidney disease), stage III (HCC)   GERD (gastroesophageal reflux disease)   Abnormal urinalysis   Diabetic gastroparesis (HCC)   History of TIA (transient ischemic attack) OSA   Diabetic foot infection -Orthopedics consulted, appreciate recommendations, plan for left BKA on Friday -Preoperative vancomycin and cefepime -Consult SW for placement   Diabetes Poorly controlled, BG 801 mg/dL admission, started on Lantus 10 and SSI.  Somewhat improved with fluids. -Increase glargine to 30 daily -Add mealtime insulin -Continue SSI corrections -Appreciate diabetes coordinator  AKI versus CKD Looks like CKD stage III is her baseline, but baseline unclear, possibly around 1.3.  Improved overnight with fluids.  Hypertension, chronic diastolic CHF, CV disease (TIA) secondary prevention: Euvolemic, hypertensive.  Echocardiogram EF 60%, grade 1 DD -Continue lisinopril, carvedilol, Lasix  Other medications -Continue Reglan -Continue Prozac       DVT prophylaxis: Heparin Code Status: FULL Family Communication: None present Disposition Plan: IV antibitoics then transtib amputation Friday.  Afterwards, likely placement on Tuesday.   Consultants:   Orthopedics  Procedures:   None  Antimicrobials:   Vancomycin  10/29 >>  Cefepime 10/29 >>         Subjective: Feeling anxious about surgery, how she will get around.  Foot numb, no new pain, redness and drainage no different.  Objective: Vitals:   06/18/17 2251 06/19/17 0527 06/19/17 0910 06/19/17 1454  BP: (!) 109/54 137/61 (!) 117/48 (!) 114/45  Pulse: 71 67 71 67  Resp: 16 17  18   Temp: 98.5 F (36.9 C) 98.8 F (37.1 C)  98.7 F (37.1 C)  TempSrc: Oral Oral  Oral  SpO2: 98% 96%  95%  Weight:      Height:        Intake/Output Summary (Last 24 hours) at 06/19/17 1602 Last data filed at 06/19/17 1500  Gross per 24 hour  Intake           1467.5 ml  Output              477 ml  Net            990.5 ml   Filed Weights   06/18/17 0957  Weight: 97.5 kg (215 lb)    Examination: General appearance: Overweight adult female, alert and in no acute distress.   HEENT: Anicteric, conjunctiva pink, lids and lashes normal. No nasal deformity, discharge, epistaxis.  Lips moist.   Skin: Warm and dry.  No jaundice.  No suspicious rashes or lesions.  Right foot red, puffy, edematous, warm. Cardiac: RRR, nl S1-S2, no murmurs appreciated.  Capillary refill is brisk.  JVP not visible.  No LE edema.  Radial pulses 2+ and symmetric.  DP pulse in left not appreciated. Respiratory: Normal respiratory rate and rhythm.  CTAB without rales or wheezes. Abdomen: Abdomen soft.  No TTP. No ascites, distension, hepatosplenomegaly.  MSK: No deformities or effusions. Neuro: Awake and alert.  EOMI, moves all extremities. Speech fluent.    Psych: Sensorium intact and responding to questions, attention normal. Affect normal.  Judgment and insight appear normal.    Data Reviewed: I have personally reviewed following labs and imaging studies:  CBC:  Recent Labs Lab 06/18/17 1010 06/19/17 0601  WBC 9.3 5.7  HGB 10.5* 8.6*  HCT 30.3* 26.3*  MCV 83.0 83.0  PLT 365 245   Basic Metabolic Panel:  Recent Labs Lab 06/18/17 1010 06/18/17 1718  06/18/17 2240 06/19/17 0601  NA 125*  --  130* 132*  K 5.0  --  5.2* 4.7  CL 87*  --  94* 99*  CO2 22  --  26 25  GLUCOSE 801*  --  584* 406*  BUN 44*  --  39* 33*  CREATININE 1.81*  --  1.64* 1.44*  CALCIUM 9.4  --  8.8* 8.9  MG  --  1.9  --   --   PHOS  --  2.7  --   --    GFR: Estimated Creatinine Clearance: 51 mL/min (A) (by C-G formula based on SCr of 1.44 mg/dL (H)). Liver Function Tests:  Recent Labs Lab 06/19/17 0601  AST 11*  ALT 11*  ALKPHOS 87  BILITOT 0.3  PROT 6.0*  ALBUMIN 2.2*   No results for input(s): LIPASE, AMYLASE in the last 168 hours. No results for input(s): AMMONIA in the last 168 hours. Coagulation Profile:  Recent Labs Lab 06/18/17 1718  INR 1.13   Cardiac Enzymes: No results for input(s): CKTOTAL, CKMB, CKMBINDEX, TROPONINI in the last 168 hours. BNP (last 3 results) No results for input(s): PROBNP in the last 8760 hours. HbA1C:  Recent Labs  06/18/17 1718  HGBA1C >15.5*   CBG:  Recent Labs Lab 06/18/17 1751 06/18/17 2253 06/19/17 0412 06/19/17 0812 06/19/17 1207  GLUCAP 494* 536* 385* 372* 385*   Lipid Profile: No results for input(s): CHOL, HDL, LDLCALC, TRIG, CHOLHDL, LDLDIRECT in the last 72 hours. Thyroid Function Tests: No results for input(s): TSH, T4TOTAL, FREET4, T3FREE, THYROIDAB in the last 72 hours. Anemia Panel: No results for input(s): VITAMINB12, FOLATE, FERRITIN, TIBC, IRON, RETICCTPCT in the last 72 hours. Urine analysis:    Component Value Date/Time   COLORURINE YELLOW 06/18/2017 1310   APPEARANCEUR HAZY (A) 06/18/2017 1310   LABSPEC 1.018 06/18/2017 1310   PHURINE 5.0 06/18/2017 1310   GLUCOSEU >=500 (A) 06/18/2017 1310   HGBUR MODERATE (A) 06/18/2017 1310   HGBUR negative 03/25/2010 1147   BILIRUBINUR NEGATIVE 06/18/2017 1310   KETONESUR NEGATIVE 06/18/2017 1310   PROTEINUR 30 (A) 06/18/2017 1310   UROBILINOGEN 0.2 12/07/2014 2125   NITRITE NEGATIVE 06/18/2017 1310   LEUKOCYTESUR TRACE (A)  06/18/2017 1310   Sepsis Labs: @LABRCNTIP (procalcitonin:4,lacticidven:4)  ) Recent Results (from the past 240 hour(s))  Culture, blood (routine x 2)     Status: None (Preliminary result)   Collection Time: 06/18/17 11:50 AM  Result Value Ref Range Status   Specimen Description BLOOD LEFT HAND  Final   Special Requests   Final    BOTTLES DRAWN AEROBIC AND ANAEROBIC Blood Culture adequate volume   Culture NO GROWTH 1 DAY  Final   Report Status PENDING  Incomplete  Culture, blood (routine x 2)     Status: None (Preliminary result)   Collection Time: 06/18/17  1:00 PM  Result Value Ref Range Status   Specimen Description BLOOD LEFT WRIST  Final  Special Requests   Final    BOTTLES DRAWN AEROBIC AND ANAEROBIC Blood Culture adequate volume   Culture NO GROWTH 1 DAY  Final   Report Status PENDING  Incomplete  Culture, Urine     Status: None   Collection Time: 06/18/17  5:52 PM  Result Value Ref Range Status   Specimen Description URINE, CATHETERIZED  Final   Special Requests NONE  Final   Culture NO GROWTH  Final   Report Status 06/19/2017 FINAL  Final         Radiology Studies: Dg Foot Complete Left  Result Date: 06/18/2017 CLINICAL DATA:  Open foot wounds.  Evaluate for osteomyelitis. EXAM: LEFT FOOT - COMPLETE 3+ VIEW COMPARISON:  Left foot x-rays dated December 07, 2014. FINDINGS: Postsurgical changes related to prior amputation of the great toe and first metatarsal, as well as the second and third toes. No acute fracture or malalignment. No definite osteolysis or cortical destruction. Mild chronic cortical thickening of the second metatarsal shaft is unchanged. Severe midfoot degenerative changes are again noted. Diffuse soft tissue swelling of the forefoot with focal lucency at along the plantar aspect at the base of the medial cuneiform. IMPRESSION: 1. Plantar ulceration at the base of the medial cuneiform with diffuse soft tissue swelling of the forefoot, consistent with  cellulitis. No radiographic evidence of osteomyelitis. 2. Stable midfoot neuropathic changes and postsurgical changes from prior amputations. Electronically Signed   By: Obie Dredge M.D.   On: 06/18/2017 12:46        Scheduled Meds: . carvedilol  3.125 mg Oral BID WC  . clopidogrel  75 mg Oral Daily  . FLUoxetine  40 mg Oral Daily  . furosemide  20 mg Oral Daily  . heparin  5,000 Units Subcutaneous Q8H  . insulin aspart  0-20 Units Subcutaneous TID WC  . insulin aspart  0-5 Units Subcutaneous QHS  . insulin aspart  5 Units Subcutaneous TID WC  . [START ON 06/20/2017] insulin glargine  30 Units Subcutaneous Daily  . levothyroxine  50 mcg Oral BH-q7a  . lisinopril  5 mg Oral Daily  . metoCLOPramide  10 mg Oral QID  . oxybutynin  10 mg Oral Daily  . rosuvastatin  40 mg Oral Daily   Continuous Infusions: . sodium chloride 75 mL/hr at 06/19/17 1214  . ceFEPime (MAXIPIME) IV    . [START ON 06/20/2017] vancomycin       LOS: 1 day    Time spent: 20 minutes    Alberteen Sam, MD [facility] Pager [phone]  If 7PM-7AM, please contact night-coverage [email or web address] Password [email or web address] 06/19/2017, 4:02 PM

## 2017-06-19 NOTE — Progress Notes (Signed)
[name],[name] 56 years old admitted from ED. Alert and oriented x4.vital signs are stable. Skin assessment done with another nurse. Given instruction about call bell and phone. Bed in low position and side rail up x2.

## 2017-06-19 NOTE — Progress Notes (Signed)
Patient refused CPAP for the night  

## 2017-06-20 LAB — GLUCOSE, CAPILLARY
Glucose-Capillary: 367 mg/dL — ABNORMAL HIGH (ref 65–99)
Glucose-Capillary: 376 mg/dL — ABNORMAL HIGH (ref 65–99)
Glucose-Capillary: 258 mg/dL — ABNORMAL HIGH (ref 65–99)
Glucose-Capillary: 359 mg/dL — ABNORMAL HIGH (ref 65–99)

## 2017-06-20 MED ORDER — INSULIN ASPART 100 UNIT/ML ~~LOC~~ SOLN
7.0000 [IU] | Freq: Three times a day (TID) | SUBCUTANEOUS | Status: DC
  Administered 2017-06-20 – 2017-06-21 (×3): 7 [IU] via SUBCUTANEOUS

## 2017-06-20 MED ORDER — INSULIN GLARGINE 100 UNIT/ML ~~LOC~~ SOLN
35.0000 [IU] | Freq: Every day | SUBCUTANEOUS | Status: DC
  Administered 2017-06-21: 35 [IU] via SUBCUTANEOUS
  Filled 2017-06-20: qty 0.35

## 2017-06-20 NOTE — Progress Notes (Signed)
Inpatient Diabetes Program Recommendations  AACE/ADA: New Consensus Statement on Inpatient Glycemic Control (2015)  Target Ranges:  Prepandial:   less than 140 mg/dL      Peak postprandial:   less than 180 mg/dL (1-2 hours)      Critically ill patients:  140 - 180 mg/dL   Results for [name], [name] (MRN [id number]) as of 06/20/2017 14:17  Ref. Range 06/19/2017 04:12 06/19/2017 08:12 06/19/2017 12:07 06/19/2017 16:31 06/19/2017 21:25 06/20/2017 08:08 06/20/2017 12:28  Glucose-Capillary Latest Ref Range: 65 - 99 mg/dL 940 (H) 768 (H) 088 (H) 398 (H) 301 (H) 367 (H) 376 (H)   Review of Glycemic Control  Diabetes history: DM2 Outpatient Diabetes medications: Humulin R U500 (Concentrated insulin) 60 units with breakfast, Humulin R U500 50 units with lunch, Humulin R U500 30 units at bedtime Current orders for Inpatient glycemic control: Lantus 35 units daily, Novolog 7 units TID with meals, Novolog 0-20 units TID with meals, Novolog 0-5 units QHS  Inpatient Diabetes Program Recommendations:  Insulin - Basal: Noted patient received Lantus 30 units today and Lantus order was increased to 35 units daily. Please consider increasing Lantus to 35 units BID. Insulin - Meal Coverage: Noted Novolog meal coverage ordered to start today. Please consider further increasing meal coverage to Novolog 12 units TID with meals.  NOTE: Noted home medication list has Regular insulin as outpatient insulin being used. In reviewing chart it was noted patient is followed by Endocrinology at [facility] and seen Pola Corn, PA 01/04/17. Per office note on 01/04/17 "Pt is on 202 units/day total of insulin with no improvement to A1C. We previously discussed trying U-500 and are going to initiate after today's visit. Pt agreeable. Stop Levemir 65 units BID and Novolog at 24+2 units qAC. Start Humulin R U-500 Kwik Pen at 60 units at 37m, 50 units at 2pm and 30 units at 10pm."  Spoke with patient over the phone to clarify  insulin type and dosages and patient confirms that she was started on Humulin R U500 on 01/04/17, she is using the Humulin R U500 kwik pens, and she reports she is taking 60 units with breakfast, 50 units with lunch, and 30 units at bedtime.  Patient reports that her glucose continues to be in the 200-300's mg/dl with current Humulin R U500 dosages. Called Pharmacy (Spoke with Windell Moulding) and asked that home medication list be updated to discontinue Regular and input HUMULIN R U500.   Thanks, Orlando Penner, RN, MSN, CDE Diabetes Coordinator Inpatient Diabetes Program [phone] (Team Pager from 8am to 5pm)

## 2017-06-20 NOTE — Progress Notes (Signed)
Patient has lost 3 IV sites during the shift.  Patient denies removing the IV's.  Will continue to monitor the patient

## 2017-06-20 NOTE — Progress Notes (Signed)
IV team nurse approached nurse and stated patient room smelled like cigarette smoke and reported there cigarette butts in the water pitcher.  Found several cigarette butts floating in the water pitcher. Patient admitted to smoking in the room but was unaware it was against [name] policy. Educated the patient the importance of smoking cessation and [facility] policy.  Will continue to monitor the patient

## 2017-06-20 NOTE — Progress Notes (Addendum)
PROGRESS NOTE  [name] RXV:[id number] DOB: 01/18/1961 DOA: 06/18/2017 PCP: Nichole Jacobson, MD   LOS: 2 days   Brief Narrative / Interim history: 56 year old female with past medical history significant for IDDM, poorly controlled, right BKA, chronic diastolic CHF, gastroparesis, and cerebrovascular disease who presents with left foot cellulitis.  Was evaluated in ER by Dr. Lajoyce [name] who recommends left BKA on Friday with pre-operative broad spectrum antibiotics.  Assessment & Plan: Active Problems:   Diabetes mellitus type 2, uncontrolled, with complications (HCC)   Cellulitis   Chronic diastolic heart failure (HCC)   Diabetic foot ulcer (HCC)   Hyperglycemia   CKD (chronic kidney disease), stage III (HCC)   GERD (gastroesophageal reflux disease)   Abnormal urinalysis   Diabetic gastroparesis (HCC)   History of TIA (transient ischemic attack)   Diabetic foot infection -Orthopedics consulted, appreciate recommendations, plan for left BKA on Friday -Preoperative vancomycin and cefepime -Stable, afebrile  Diabetes -Poorly controlled, BG 801 mg/dL admission, started on Lantus 10 and SSI.  Somewhat improved with fluids. -CBGs in the 300s this morning, currently -increase glargine to 35 units, increase mealtime insulin from 5-7 units, continue sliding scale  AKI versus CKD -Looks like CKD stage III is her baseline, but baseline unclear, possibly around 1.3.  Improved overnight with fluids. -Creatinine stable today, continue to monitor  Hypertension, chronic diastolic CHF, CV disease (TIA) secondary prevention: -Euvolemic, hypertensive.  Echocardiogram EF 60%, grade 1 DD -Continue lisinopril, carvedilol, Lasix  Other medications -Continue Reglan -Continue Prozac   DVT prophylaxis: heparin Code Status: Full code Family Communication: no family present Disposition Plan: home vs SNF post op  Consultants:   Orthopedic surgery   Procedures:   None    Antimicrobials:  Vancomycin 10/29 >>  Cefepime 10/29 >>    Subjective: - no chest pain, shortness of breath, no abdominal pain, nausea or vomiting.   Objective: Vitals:   06/19/17 1658 06/19/17 2125 06/20/17 0636 06/20/17 0907  BP: (!) 125/45 (!) 118/51 (!) 165/66 109/78  Pulse: 64 66 64 72  Resp:  17 20   Temp:  98.2 F (36.8 C) 98.2 F (36.8 C)   TempSrc:  Oral Oral   SpO2:  97% 97%   Weight:      Height:        Intake/Output Summary (Last 24 hours) at 06/20/17 1341 Last data filed at 06/20/17 1257  Gross per 24 hour  Intake           2097.5 ml  Output             1151 ml  Net            946.5 ml   Filed Weights   06/18/17 0957  Weight: 97.5 kg (215 lb)    Examination:  Constitutional: NAD Eyes: PERRL, lids and conjunctivae normal Respiratory: clear to auscultation bilaterally, no wheezing, no crackles.  Cardiovascular: Regular rate and rhythm, no murmurs / rubs / gallops. No LE edema. 2+ pedal pulses.  Abdomen: no tenderness. Bowel sounds positive.  Musculoskeletal: no clubbing / cyanosis. Post right BKA, left foot wrapped Skin: no rashes, lesions, ulcers. No induration Neurologic: CN 2-12 grossly intact. Strength 5/5 in all 4.   Data Reviewed: I have independently reviewed following labs and imaging studies   CBC:  Recent Labs Lab 06/18/17 1010 06/19/17 0601  WBC 9.3 5.7  HGB 10.5* 8.6*  HCT 30.3* 26.3*  MCV 83.0 83.0  PLT 365 245   Basic Metabolic Panel:  Recent  Labs Lab 06/18/17 1010 06/18/17 1718 06/18/17 2240 06/19/17 0601  NA 125*  --  130* 132*  K 5.0  --  5.2* 4.7  CL 87*  --  94* 99*  CO2 22  --  26 25  GLUCOSE 801*  --  584* 406*  BUN 44*  --  39* 33*  CREATININE 1.81*  --  1.64* 1.44*  CALCIUM 9.4  --  8.8* 8.9  MG  --  1.9  --   --   PHOS  --  2.7  --   --    GFR: Estimated Creatinine Clearance: 51 mL/min (A) (by C-G formula based on SCr of 1.44 mg/dL (H)). Liver Function Tests:  Recent Labs Lab 06/19/17 0601   AST 11*  ALT 11*  ALKPHOS 87  BILITOT 0.3  PROT 6.0*  ALBUMIN 2.2*   No results for input(s): LIPASE, AMYLASE in the last 168 hours. No results for input(s): AMMONIA in the last 168 hours. Coagulation Profile:  Recent Labs Lab 06/18/17 1718  INR 1.13   Cardiac Enzymes: No results for input(s): CKTOTAL, CKMB, CKMBINDEX, TROPONINI in the last 168 hours. BNP (last 3 results) No results for input(s): PROBNP in the last 8760 hours. HbA1C:  Recent Labs  06/18/17 1718  HGBA1C >15.5*   CBG:  Recent Labs Lab 06/19/17 1207 06/19/17 1631 06/19/17 2125 06/20/17 0808 06/20/17 1228  GLUCAP 385* 398* 301* 367* 376*   Lipid Profile: No results for input(s): CHOL, HDL, LDLCALC, TRIG, CHOLHDL, LDLDIRECT in the last 72 hours. Thyroid Function Tests: No results for input(s): TSH, T4TOTAL, FREET4, T3FREE, THYROIDAB in the last 72 hours. Anemia Panel: No results for input(s): VITAMINB12, FOLATE, FERRITIN, TIBC, IRON, RETICCTPCT in the last 72 hours. Urine analysis:    Component Value Date/Time   COLORURINE YELLOW 06/18/2017 1310   APPEARANCEUR HAZY (A) 06/18/2017 1310   LABSPEC 1.018 06/18/2017 1310   PHURINE 5.0 06/18/2017 1310   GLUCOSEU >=500 (A) 06/18/2017 1310   HGBUR MODERATE (A) 06/18/2017 1310   HGBUR negative 03/25/2010 1147   BILIRUBINUR NEGATIVE 06/18/2017 1310   KETONESUR NEGATIVE 06/18/2017 1310   PROTEINUR 30 (A) 06/18/2017 1310   UROBILINOGEN 0.2 12/07/2014 2125   NITRITE NEGATIVE 06/18/2017 1310   LEUKOCYTESUR TRACE (A) 06/18/2017 1310   Sepsis Labs: Invalid input(s): PROCALCITONIN, LACTICIDVEN  Recent Results (from the past 240 hour(s))  Culture, blood (routine x 2)     Status: None (Preliminary result)   Collection Time: 06/18/17 11:50 AM  Result Value Ref Range Status   Specimen Description BLOOD LEFT HAND  Final   Special Requests   Final    BOTTLES DRAWN AEROBIC AND ANAEROBIC Blood Culture adequate volume   Culture NO GROWTH 2 DAYS  Final    Report Status PENDING  Incomplete  Culture, blood (routine x 2)     Status: None (Preliminary result)   Collection Time: 06/18/17  1:00 PM  Result Value Ref Range Status   Specimen Description BLOOD LEFT WRIST  Final   Special Requests   Final    BOTTLES DRAWN AEROBIC AND ANAEROBIC Blood Culture adequate volume   Culture NO GROWTH 2 DAYS  Final   Report Status PENDING  Incomplete  Culture, Urine     Status: None   Collection Time: 06/18/17  5:52 PM  Result Value Ref Range Status   Specimen Description URINE, CATHETERIZED  Final   Special Requests NONE  Final   Culture NO GROWTH  Final   Report Status 06/19/2017 FINAL  Final  MRSA PCR Screening     Status: None   Collection Time: 06/19/17  1:55 PM  Result Value Ref Range Status   MRSA by PCR NEGATIVE NEGATIVE Final    Comment:        The GeneXpert MRSA Assay (FDA approved for NASAL specimens only), is one component of a comprehensive MRSA colonization surveillance program. It is not intended to diagnose MRSA infection nor to guide or monitor treatment for MRSA infections.       Radiology Studies: No results found.   Scheduled Meds: . carvedilol  3.125 mg Oral BID WC  . clopidogrel  75 mg Oral Daily  . FLUoxetine  40 mg Oral Daily  . furosemide  20 mg Oral Daily  . heparin  5,000 Units Subcutaneous Q8H  . insulin aspart  0-20 Units Subcutaneous TID WC  . insulin aspart  0-5 Units Subcutaneous QHS  . insulin aspart  5 Units Subcutaneous TID WC  . insulin glargine  30 Units Subcutaneous Daily  . levothyroxine  50 mcg Oral BH-q7a  . metoCLOPramide  10 mg Oral QID  . oxybutynin  10 mg Oral Daily  . rosuvastatin  40 mg Oral Daily   Continuous Infusions: . sodium chloride 75 mL/hr at 06/20/17 0620  . ceFEPime (MAXIPIME) IV Stopped (06/20/17 0946)  . vancomycin Stopped (06/20/17 0720)    Pamella Pert, MD, PhD [facility] Pager [phone]  If 7PM-7AM, please contact  night-coverage [email or web address] Password [email or web address] 06/20/2017, 1:41 PM

## 2017-06-21 LAB — CBC
HCT: 29.6 % — ABNORMAL LOW (ref 36.0–46.0)
HEMOGLOBIN: 9.9 g/dL — AB (ref 12.0–15.0)
MCH: 27.7 pg (ref 26.0–34.0)
MCHC: 33.4 g/dL (ref 30.0–36.0)
MCV: 82.7 fL (ref 78.0–100.0)
Platelets: 308 10*3/uL (ref 150–400)
RBC: 3.58 MIL/uL — AB (ref 3.87–5.11)
RDW: 12.6 % (ref 11.5–15.5)
WBC: 4.3 10*3/uL (ref 4.0–10.5)

## 2017-06-21 LAB — GLUCOSE, CAPILLARY
GLUCOSE-CAPILLARY: 272 mg/dL — AB (ref 65–99)
GLUCOSE-CAPILLARY: 325 mg/dL — AB (ref 65–99)
Glucose-Capillary: 330 mg/dL — ABNORMAL HIGH (ref 65–99)
Glucose-Capillary: 367 mg/dL — ABNORMAL HIGH (ref 65–99)

## 2017-06-21 LAB — BASIC METABOLIC PANEL
ANION GAP: 10 (ref 5–15)
BUN: 19 mg/dL (ref 6–20)
CALCIUM: 9.2 mg/dL (ref 8.9–10.3)
CO2: 26 mmol/L (ref 22–32)
Chloride: 100 mmol/L — ABNORMAL LOW (ref 101–111)
Creatinine, Ser: 1.16 mg/dL — ABNORMAL HIGH (ref 0.44–1.00)
GFR, EST NON AFRICAN AMERICAN: 52 mL/min — AB (ref >60)
GLUCOSE: 284 mg/dL — AB (ref 65–99)
POTASSIUM: 4 mmol/L (ref 3.5–5.1)
SODIUM: 136 mmol/L (ref 135–145)

## 2017-06-21 MED ORDER — INSULIN GLARGINE 100 UNIT/ML ~~LOC~~ SOLN
25.0000 [IU] | Freq: Two times a day (BID) | SUBCUTANEOUS | Status: DC
  Administered 2017-06-21 – 2017-06-23 (×3): 25 [IU] via SUBCUTANEOUS
  Filled 2017-06-21 (×6): qty 0.25

## 2017-06-21 MED ORDER — INSULIN ASPART 100 UNIT/ML ~~LOC~~ SOLN
14.0000 [IU] | Freq: Three times a day (TID) | SUBCUTANEOUS | Status: DC
  Administered 2017-06-21 – 2017-06-23 (×5): 14 [IU] via SUBCUTANEOUS

## 2017-06-21 MED ORDER — CLINDAMYCIN PHOSPHATE 900 MG/50ML IV SOLN: 900.0000 mg | INTRAVENOUS | Status: DC

## 2017-06-21 NOTE — Progress Notes (Signed)
Inpatient Diabetes Program Recommendations  AACE/ADA: New Consensus Statement on Inpatient Glycemic Control (2015)  Target Ranges:  Prepandial:   less than 140 mg/dL      Peak postprandial:   less than 180 mg/dL (1-2 hours)      Critically ill patients:  140 - 180 mg/dL   Results for [name], [name] (MRN [id number]) as of 06/21/2017 08:13  Ref. Range 06/20/2017 08:08 06/20/2017 12:28 06/20/2017 17:41 06/20/2017 21:15  Glucose-Capillary Latest Ref Range: 65 - 99 mg/dL 366 (H)  Novolog 25 units  Lantus 30 units 376 (H)  Novolog 25 units 258 (H)  Novolog 18 units 359 (H)  Novolog 5 units   Review of Glycemic Control Diabetes history: DM2 Outpatient Diabetes medications: Humulin R U500 (Concentrated insulin) 60 units with breakfast, Humulin R U500 50 units with lunch, Humulin R U500 30 units at bedtime Current orders for Inpatient glycemic control: Lantus 35 units daily, Novolog 7 units TID with meals, Novolog 0-20 units TID with meals, Novolog 0-5 units QHS  Inpatient Diabetes Program Recommendations:  Insulin - Basal:Please consider increasing Lantus to 35 units BID. Insulin - Meal Coverage: Please consider increasing meal coverage to Novolog 14 units TID with meals.  NOTE: On 06/20/17 patient's glucose ranged from 258-376 mg/dl and patient received a total of Novolog 73 units (56 units for correction and 17 units for meal coverage). Patient takes Humulin R U500 as an outpatient and is going to require much more insulin than currently ordered.  Thanks, Orlando Penner, RN, MSN, CDE Diabetes Coordinator Inpatient Diabetes Program [phone] (Team Pager from 8am to 5pm)

## 2017-06-21 NOTE — Anesthesia Preprocedure Evaluation (Addendum)
Anesthesia Evaluation  Patient identified by MRN, date of birth, ID band Patient awake    Reviewed: Allergy & Precautions, NPO status , Patient's Chart, lab work & pertinent test results  Airway Mallampati: II  TM Distance: >3 FB Neck ROM: Full    Dental  (+) Dental Advisory Given   Pulmonary sleep apnea , COPD, Current Smoker,    breath sounds clear to auscultation       Cardiovascular hypertension, Pt. on medications and Pt. on home beta blockers + Peripheral Vascular Disease and +CHF   Rhythm:Regular Rate:Normal  06/2016: Left ventricle: The cavity size was normal. Wall thickness was increased in a pattern of moderate LVH. Systolic function was vigorous. The estimated ejection fraction was in the range of 65% to 70%. - Aortic valve: L coronary cusp is calcified/thickened. - Mitral valve: Calcified annulus. Mildly thickened leaflets . - Pericardium, extracardiac: A trivial pericardial effusion was identified.   Neuro/Psych  Headaches, TIA Neuromuscular disease    GI/Hepatic Neg liver ROS, GERD  ,  Endo/Other  diabetes, Poorly Controlled, Type 2, Insulin DependentHypothyroidism   Renal/GU Renal disease     Musculoskeletal   Abdominal   Peds  Hematology  (+) anemia ,   Anesthesia Other Findings   Reproductive/Obstetrics                            Lab Results  Component Value Date   WBC 4.3 06/21/2017   HGB 9.9 (L) 06/21/2017   HCT 29.6 (L) 06/21/2017   MCV 82.7 06/21/2017   PLT 308 06/21/2017   Lab Results  Component Value Date   CREATININE 1.16 (H) 06/21/2017   BUN 19 06/21/2017   NA 136 06/21/2017   K 4.0 06/21/2017   CL 100 (L) 06/21/2017   CO2 26 06/21/2017    Anesthesia Physical Anesthesia Plan  ASA: III  Anesthesia Plan: General   Post-op Pain Management:    Induction: Intravenous  PONV Risk Score and Plan: 3 and Ondansetron, Dexamethasone, Midazolam and Treatment  may vary due to age or medical condition  Airway Management Planned: LMA  Additional Equipment:   Intra-op Plan:   Post-operative Plan: Extubation in OR  Informed Consent: I have reviewed the patients History and Physical, chart, labs and discussed the procedure including the risks, benefits and alternatives for the proposed anesthesia with the patient or authorized representative who has indicated his/her understanding and acceptance.   Dental advisory given  Plan Discussed with: CRNA  Anesthesia Plan Comments: (Discussed risks and benefits of regional block. Declines at this time but will reassess in PACU if needed.)      Anesthesia Quick Evaluation

## 2017-06-21 NOTE — Progress Notes (Signed)
PROGRESS NOTE  [name] NID:[id number] DOB: November 14, 1960 DOA: 06/18/2017 PCP: Loyal Jacobson, MD   LOS: 3 days   Brief Narrative / Interim history: 56 year old female with past medical history significant for IDDM, poorly controlled, right BKA, chronic diastolic CHF, gastroparesis, and cerebrovascular disease who presents with left foot cellulitis.  Was evaluated in ER by Dr. Lajoyce Corners who recommends left BKA on Friday with pre-operative broad spectrum antibiotics.  Assessment & Plan: Active Problems:   Diabetes mellitus type 2, uncontrolled, with complications (HCC)   Cellulitis   Chronic diastolic heart failure (HCC)   Diabetic foot ulcer (HCC)   Hyperglycemia   CKD (chronic kidney disease), stage III (HCC)   GERD (gastroesophageal reflux disease)   Abnormal urinalysis   Diabetic gastroparesis (HCC)   History of TIA (transient ischemic attack)   Diabetic foot infection -Orthopedics consulted, appreciate recommendations, plan for left BKA on Friday -Preoperative vancomycin and cefepime, hold ACE inhibitor preop -Stable, afebrile  Diabetes -Poorly controlled, BG 801 mg/dL admission, started on Lantus 10 and SSI.  Somewhat improved with fluids. -Change glargine to 25 units twice daily (50 total per day), increase mealtime coverage to 14 units plus sliding scale  AKI versus CKD -Looks like CKD stage III is her baseline, but baseline unclear, possibly around 1.3.  Improved overnight with fluids. -Creatinine stable today, continue to monitor  Hypertension, chronic diastolic CHF, CV disease (TIA) secondary prevention: -Euvolemic, hypertensive.  Echocardiogram EF 60%, grade 1 DD -Continue carvedilol, Lasix -Hold ACE inhibitor preop  Other medications -Continue Reglan -Continue Prozac   DVT prophylaxis: heparin Code Status: Full code Family Communication: no family present Disposition Plan: home vs SNF post op  Consultants:   Orthopedic surgery   Procedures:    None   Antimicrobials:  Vancomycin 10/29 >>  Cefepime 10/29 >>    Subjective: -No complaints, she is feeling well this morning  Objective: Vitals:   06/20/17 2116 06/21/17 0500 06/21/17 0612 06/21/17 0908  BP: (!) 152/69  126/63 134/74  Pulse: 71  81 82  Resp: 18  18   Temp: 98.1 F (36.7 C)  98 F (36.7 C)   TempSrc: Oral  Oral   SpO2: 98%  96%   Weight:  91.7 kg (202 lb 3.2 oz)    Height:        Intake/Output Summary (Last 24 hours) at 06/21/17 1303 Last data filed at 06/21/17 1106  Gross per 24 hour  Intake           2377.5 ml  Output             2300 ml  Net             77.5 ml   Filed Weights   06/18/17 0957 06/21/17 0500  Weight: 97.5 kg (215 lb) 91.7 kg (202 lb 3.2 oz)    Examination:  Constitutional: NAD, calm, comfortable Eyes: lids and conjunctivae normal ENMT: Mucous membranes are moist.  Respiratory: clear to auscultation bilaterally, no wheezing, no crackles. Normal respiratory effort.  Cardiovascular: Regular rate and rhythm, no murmurs / rubs / gallops. No LE edema. 2+ pedal pulses.  Abdomen: no tenderness. Bowel sounds positive.  Skin: Old right BKA, left foot wrapped Neurologic: non focal  Data Reviewed: I have independently reviewed following labs and imaging studies   CBC:  Recent Labs Lab 06/18/17 1010 06/19/17 0601 06/21/17 0703  WBC 9.3 5.7 4.3  HGB 10.5* 8.6* 9.9*  HCT 30.3* 26.3* 29.6*  MCV 83.0 83.0 82.7  PLT  365 245 308   Basic Metabolic Panel:  Recent Labs Lab 06/18/17 1010 06/18/17 1718 06/18/17 2240 06/19/17 0601 06/21/17 0703  NA 125*  --  130* 132* 136  K 5.0  --  5.2* 4.7 4.0  CL 87*  --  94* 99* 100*  CO2 22  --  26 25 26   GLUCOSE 801*  --  584* 406* 284*  BUN 44*  --  39* 33* 19  CREATININE 1.81*  --  1.64* 1.44* 1.16*  CALCIUM 9.4  --  8.8* 8.9 9.2  MG  --  1.9  --   --   --   PHOS  --  2.7  --   --   --    GFR: Estimated Creatinine Clearance: 61.3 mL/min (A) (by C-G formula based on SCr of  1.16 mg/dL (H)). Liver Function Tests:  Recent Labs Lab 06/19/17 0601  AST 11*  ALT 11*  ALKPHOS 87  BILITOT 0.3  PROT 6.0*  ALBUMIN 2.2*   No results for input(s): LIPASE, AMYLASE in the last 168 hours. No results for input(s): AMMONIA in the last 168 hours. Coagulation Profile:  Recent Labs Lab 06/18/17 1718  INR 1.13   Cardiac Enzymes: No results for input(s): CKTOTAL, CKMB, CKMBINDEX, TROPONINI in the last 168 hours. BNP (last 3 results) No results for input(s): PROBNP in the last 8760 hours. HbA1C:  Recent Labs  06/18/17 1718  HGBA1C >15.5*   CBG:  Recent Labs Lab 06/20/17 1228 06/20/17 1741 06/20/17 2115 06/21/17 0805 06/21/17 1210  GLUCAP 376* 258* 359* 272* 330*   Lipid Profile: No results for input(s): CHOL, HDL, LDLCALC, TRIG, CHOLHDL, LDLDIRECT in the last 72 hours. Thyroid Function Tests: No results for input(s): TSH, T4TOTAL, FREET4, T3FREE, THYROIDAB in the last 72 hours. Anemia Panel: No results for input(s): VITAMINB12, FOLATE, FERRITIN, TIBC, IRON, RETICCTPCT in the last 72 hours. Urine analysis:    Component Value Date/Time   COLORURINE YELLOW 06/18/2017 1310   APPEARANCEUR HAZY (A) 06/18/2017 1310   LABSPEC 1.018 06/18/2017 1310   PHURINE 5.0 06/18/2017 1310   GLUCOSEU >=500 (A) 06/18/2017 1310   HGBUR MODERATE (A) 06/18/2017 1310   HGBUR negative 03/25/2010 1147   BILIRUBINUR NEGATIVE 06/18/2017 1310   KETONESUR NEGATIVE 06/18/2017 1310   PROTEINUR 30 (A) 06/18/2017 1310   UROBILINOGEN 0.2 12/07/2014 2125   NITRITE NEGATIVE 06/18/2017 1310   LEUKOCYTESUR TRACE (A) 06/18/2017 1310   Sepsis Labs: Invalid input(s): PROCALCITONIN, LACTICIDVEN  Recent Results (from the past 240 hour(s))  Culture, blood (routine x 2)     Status: None (Preliminary result)   Collection Time: 06/18/17 11:50 AM  Result Value Ref Range Status   Specimen Description BLOOD LEFT HAND  Final   Special Requests   Final    BOTTLES DRAWN AEROBIC AND  ANAEROBIC Blood Culture adequate volume   Culture NO GROWTH 2 DAYS  Final   Report Status PENDING  Incomplete  Culture, blood (routine x 2)     Status: None (Preliminary result)   Collection Time: 06/18/17  1:00 PM  Result Value Ref Range Status   Specimen Description BLOOD LEFT WRIST  Final   Special Requests   Final    BOTTLES DRAWN AEROBIC AND ANAEROBIC Blood Culture adequate volume   Culture NO GROWTH 2 DAYS  Final   Report Status PENDING  Incomplete  Culture, Urine     Status: None   Collection Time: 06/18/17  5:52 PM  Result Value Ref Range Status   Specimen  Description URINE, CATHETERIZED  Final   Special Requests NONE  Final   Culture NO GROWTH  Final   Report Status 06/19/2017 FINAL  Final  MRSA PCR Screening     Status: None   Collection Time: 06/19/17  1:55 PM  Result Value Ref Range Status   MRSA by PCR NEGATIVE NEGATIVE Final    Comment:        The GeneXpert MRSA Assay (FDA approved for NASAL specimens only), is one component of a comprehensive MRSA colonization surveillance program. It is not intended to diagnose MRSA infection nor to guide or monitor treatment for MRSA infections.       Radiology Studies: No results found.   Scheduled Meds: . carvedilol  3.125 mg Oral BID WC  . clopidogrel  75 mg Oral Daily  . FLUoxetine  40 mg Oral Daily  . furosemide  20 mg Oral Daily  . heparin  5,000 Units Subcutaneous Q8H  . insulin aspart  0-20 Units Subcutaneous TID WC  . insulin aspart  0-5 Units Subcutaneous QHS  . insulin aspart  7 Units Subcutaneous TID WC  . insulin glargine  35 Units Subcutaneous Daily  . levothyroxine  50 mcg Oral BH-q7a  . metoCLOPramide  10 mg Oral QID  . oxybutynin  10 mg Oral Daily  . rosuvastatin  40 mg Oral Daily   Continuous Infusions: . sodium chloride 75 mL/hr at 06/20/17 0620  . ceFEPime (MAXIPIME) IV Stopped (06/21/17 1136)  . [START ON 06/22/2017] clindamycin (CLEOCIN) IV    . vancomycin Stopped (06/21/17 8299)     Pamella Pert, MD, PhD [facility] Pager [phone]  If 7PM-7AM, please contact night-coverage [email or web address] Password [email or web address] 06/21/2017, 1:03 PM

## 2017-06-22 ENCOUNTER — Inpatient Hospital Stay (HOSPITAL_COMMUNITY): Payer: Medicare HMO | Admitting: Certified Registered"

## 2017-06-22 ENCOUNTER — Encounter (HOSPITAL_COMMUNITY)
Admission: EM | Disposition: A | Discharge status: Skilled Nursing Facility | Payer: Self-pay | Source: Home / Self Care | Attending: Internal Medicine

## 2017-06-22 DIAGNOSIS — E1165 Type 2 diabetes mellitus with hyperglycemia: Secondary | ICD-10-CM

## 2017-06-22 DIAGNOSIS — E118 Type 2 diabetes mellitus with unspecified complications: Secondary | ICD-10-CM

## 2017-06-22 HISTORY — PX: AMPUTATION: SHX166

## 2017-06-22 LAB — GLUCOSE, CAPILLARY
GLUCOSE-CAPILLARY: 275 mg/dL — AB (ref 65–99)
GLUCOSE-CAPILLARY: 260 mg/dL — AB (ref 65–99)
Glucose-Capillary: 248 mg/dL — ABNORMAL HIGH (ref 65–99)
Glucose-Capillary: 255 mg/dL — ABNORMAL HIGH (ref 65–99)
Glucose-Capillary: 227 mg/dL — ABNORMAL HIGH (ref 65–99)

## 2017-06-22 LAB — BASIC METABOLIC PANEL
ANION GAP: 9 (ref 5–15)
BUN: 22 mg/dL — ABNORMAL HIGH (ref 6–20)
CHLORIDE: 103 mmol/L (ref 101–111)
CO2: 27 mmol/L (ref 22–32)
CREATININE: 1.15 mg/dL — AB (ref 0.44–1.00)
Calcium: 9.4 mg/dL (ref 8.9–10.3)
GFR calc non Af Amer: 53 mL/min — ABNORMAL LOW (ref >60)
GLUCOSE: 249 mg/dL — AB (ref 65–99)
Potassium: 4.6 mmol/L (ref 3.5–5.1)
Sodium: 139 mmol/L (ref 135–145)

## 2017-06-22 LAB — CBC
HCT: 27.8 % — ABNORMAL LOW (ref 36.0–46.0)
Hemoglobin: 9.3 g/dL — ABNORMAL LOW (ref 12.0–15.0)
MCH: 27.6 pg (ref 26.0–34.0)
MCHC: 33.5 g/dL (ref 30.0–36.0)
MCV: 82.5 fL (ref 78.0–100.0)
Platelets: 283 10*3/uL (ref 150–400)
RBC: 3.37 MIL/uL — AB (ref 3.87–5.11)
RDW: 12.8 % (ref 11.5–15.5)
WBC: 4.3 10*3/uL (ref 4.0–10.5)

## 2017-06-22 SURGERY — AMPUTATION BELOW KNEE
Anesthesia: General | Site: Leg Lower | Laterality: Left

## 2017-06-22 MED ORDER — 0.9 % SODIUM CHLORIDE (POUR BTL) OPTIME
TOPICAL | Status: DC | PRN
  Administered 2017-06-22: 1000 mL

## 2017-06-22 MED ORDER — HYDROMORPHONE HCL 1 MG/ML IJ SOLN
1.0000 mg | INTRAMUSCULAR | Status: DC | PRN
  Administered 2017-06-23 – 2017-06-26 (×6): 1 mg via INTRAVENOUS
  Filled 2017-06-22 (×6): qty 1

## 2017-06-22 MED ORDER — PROMETHAZINE HCL 25 MG/ML IJ SOLN: 6.2500 mg | INTRAMUSCULAR | Status: DC | PRN

## 2017-06-22 MED ORDER — ONDANSETRON HCL 4 MG PO TABS: 4.0000 mg | ORAL_TABLET | Freq: Four times a day (QID) | ORAL | Status: DC | PRN

## 2017-06-22 MED ORDER — METHOCARBAMOL 1000 MG/10ML IJ SOLN
500.0000 mg | Freq: Four times a day (QID) | INTRAVENOUS | Status: DC | PRN
  Filled 2017-06-22: qty 5

## 2017-06-22 MED ORDER — CHLORHEXIDINE GLUCONATE 4 % EX LIQD
60.0000 mL | Freq: Once | CUTANEOUS | Status: AC
  Administered 2017-06-22: 4 via TOPICAL
  Filled 2017-06-22: qty 60

## 2017-06-22 MED ORDER — PHENYLEPHRINE 40 MCG/ML (10ML) SYRINGE FOR IV PUSH (FOR BLOOD PRESSURE SUPPORT)
PREFILLED_SYRINGE | INTRAVENOUS | Status: DC | PRN
  Administered 2017-06-22 (×2): 80 ug via INTRAVENOUS

## 2017-06-22 MED ORDER — ACETAMINOPHEN 325 MG PO TABS: 650.0000 mg | ORAL_TABLET | ORAL | Status: DC | PRN

## 2017-06-22 MED ORDER — SODIUM CHLORIDE 0.9 % IV SOLN
INTRAVENOUS | Status: DC
  Administered 2017-06-22: 10:00:00 via INTRAVENOUS

## 2017-06-22 MED ORDER — POLYETHYLENE GLYCOL 3350 17 G PO PACK: 17.0000 g | PACK | Freq: Every day | ORAL | Status: DC | PRN

## 2017-06-22 MED ORDER — PHENYLEPHRINE 40 MCG/ML (10ML) SYRINGE FOR IV PUSH (FOR BLOOD PRESSURE SUPPORT)
PREFILLED_SYRINGE | INTRAVENOUS | Status: AC
  Filled 2017-06-22: qty 10

## 2017-06-22 MED ORDER — METOCLOPRAMIDE HCL 5 MG/ML IJ SOLN: 5.0000 mg | Freq: Three times a day (TID) | INTRAMUSCULAR | Status: DC | PRN

## 2017-06-22 MED ORDER — HYDROMORPHONE HCL 1 MG/ML IJ SOLN
INTRAMUSCULAR | Status: AC
  Filled 2017-06-22: qty 1

## 2017-06-22 MED ORDER — METHOCARBAMOL 500 MG PO TABS
500.0000 mg | ORAL_TABLET | Freq: Four times a day (QID) | ORAL | Status: DC | PRN
  Administered 2017-06-22: 500 mg via ORAL

## 2017-06-22 MED ORDER — HYDROMORPHONE HCL 1 MG/ML IJ SOLN
0.2500 mg | INTRAMUSCULAR | Status: DC | PRN
  Administered 2017-06-22 (×3): 0.5 mg via INTRAVENOUS

## 2017-06-22 MED ORDER — ONDANSETRON HCL 4 MG/2ML IJ SOLN: 4.0000 mg | Freq: Four times a day (QID) | INTRAMUSCULAR | Status: DC | PRN

## 2017-06-22 MED ORDER — MIDAZOLAM HCL 5 MG/5ML IJ SOLN
INTRAMUSCULAR | Status: DC | PRN
  Administered 2017-06-22: 2 mg via INTRAVENOUS

## 2017-06-22 MED ORDER — MIDAZOLAM HCL 2 MG/2ML IJ SOLN
INTRAMUSCULAR | Status: AC
  Filled 2017-06-22: qty 2

## 2017-06-22 MED ORDER — FENTANYL CITRATE (PF) 250 MCG/5ML IJ SOLN
INTRAMUSCULAR | Status: DC | PRN
  Administered 2017-06-22: 25 ug via INTRAVENOUS
  Administered 2017-06-22: 50 ug via INTRAVENOUS
  Administered 2017-06-22: 25 ug via INTRAVENOUS

## 2017-06-22 MED ORDER — MAGNESIUM CITRATE PO SOLN: 1.0000 | Freq: Once | ORAL | Status: DC | PRN

## 2017-06-22 MED ORDER — ACETAMINOPHEN 650 MG RE SUPP: 650.0000 mg | RECTAL | Status: DC | PRN

## 2017-06-22 MED ORDER — DOCUSATE SODIUM 100 MG PO CAPS
100.0000 mg | ORAL_CAPSULE | Freq: Two times a day (BID) | ORAL | Status: DC
  Administered 2017-06-22 – 2017-06-26 (×9): 100 mg via ORAL
  Filled 2017-06-22 (×10): qty 1

## 2017-06-22 MED ORDER — ONDANSETRON HCL 4 MG/2ML IJ SOLN
INTRAMUSCULAR | Status: DC | PRN
  Administered 2017-06-22: 4 mg via INTRAVENOUS

## 2017-06-22 MED ORDER — METHOCARBAMOL 500 MG PO TABS
ORAL_TABLET | ORAL | Status: AC
  Filled 2017-06-22: qty 1

## 2017-06-22 MED ORDER — FENTANYL CITRATE (PF) 250 MCG/5ML IJ SOLN
INTRAMUSCULAR | Status: AC
  Filled 2017-06-22: qty 5

## 2017-06-22 MED ORDER — METOCLOPRAMIDE HCL 5 MG PO TABS
5.0000 mg | ORAL_TABLET | Freq: Three times a day (TID) | ORAL | Status: DC | PRN
  Filled 2017-06-22: qty 2

## 2017-06-22 MED ORDER — LIDOCAINE 2% (20 MG/ML) 5 ML SYRINGE
INTRAMUSCULAR | Status: DC | PRN
  Administered 2017-06-22: 60 mg via INTRAVENOUS

## 2017-06-22 MED ORDER — PROPOFOL 10 MG/ML IV BOLUS
INTRAVENOUS | Status: DC | PRN
  Administered 2017-06-22: 200 mg via INTRAVENOUS

## 2017-06-22 MED ORDER — BISACODYL 10 MG RE SUPP: 10.0000 mg | Freq: Every day | RECTAL | Status: DC | PRN

## 2017-06-22 MED ORDER — ONDANSETRON HCL 4 MG/2ML IJ SOLN
INTRAMUSCULAR | Status: AC
  Filled 2017-06-22: qty 2

## 2017-06-22 MED ORDER — LIDOCAINE 2% (20 MG/ML) 5 ML SYRINGE
INTRAMUSCULAR | Status: AC
  Filled 2017-06-22: qty 5

## 2017-06-22 MED ORDER — PROPOFOL 10 MG/ML IV BOLUS
INTRAVENOUS | Status: AC
  Filled 2017-06-22: qty 20

## 2017-06-22 SURGICAL SUPPLY — 36 items
BLADE SAW RECIP 87.9 MT (BLADE) ×3 IMPLANT
BLADE SURG 21 STRL SS (BLADE) ×3 IMPLANT
BNDG COHESIVE 6X5 TAN STRL LF (GAUZE/BANDAGES/DRESSINGS) ×6 IMPLANT
BNDG GAUZE ELAST 4 BULKY (GAUZE/BANDAGES/DRESSINGS) IMPLANT
CANISTER WOUND CARE 500ML ATS (WOUND CARE) ×3 IMPLANT
COVER SURGICAL LIGHT HANDLE (MISCELLANEOUS) ×3 IMPLANT
CUFF TOURNIQUET SINGLE 34IN LL (TOURNIQUET CUFF) ×3 IMPLANT
CUFF TOURNIQUET SINGLE 44IN (TOURNIQUET CUFF) IMPLANT
DRAPE INCISE IOBAN 66X45 STRL (DRAPES) ×3 IMPLANT
DRAPE U-SHAPE 47X51 STRL (DRAPES) ×3 IMPLANT
DRESSING PREVENA PLUS CUSTOM (GAUZE/BANDAGES/DRESSINGS) ×1 IMPLANT
DRSG PREVENA PLUS CUSTOM (GAUZE/BANDAGES/DRESSINGS) ×3
DRSG VAC ATS MED SENSATRAC (GAUZE/BANDAGES/DRESSINGS) IMPLANT
ELECT REM PT RETURN 9FT ADLT (ELECTROSURGICAL) ×3
ELECTRODE REM PT RTRN 9FT ADLT (ELECTROSURGICAL) ×1 IMPLANT
GLOVE BIOGEL PI IND STRL 9 (GLOVE) ×1 IMPLANT
GLOVE BIOGEL PI INDICATOR 9 (GLOVE) ×2
GLOVE SURG ORTHO 9.0 STRL STRW (GLOVE) ×3 IMPLANT
GOWN STRL REUS W/ TWL XL LVL3 (GOWN DISPOSABLE) ×2 IMPLANT
GOWN STRL REUS W/TWL XL LVL3 (GOWN DISPOSABLE) ×4
KIT BASIN OR (CUSTOM PROCEDURE TRAY) ×3 IMPLANT
KIT ROOM TURNOVER OR (KITS) ×3 IMPLANT
MANIFOLD NEPTUNE II (INSTRUMENTS) ×3 IMPLANT
NS IRRIG 1000ML POUR BTL (IV SOLUTION) ×3 IMPLANT
PACK ORTHO EXTREMITY (CUSTOM PROCEDURE TRAY) ×3 IMPLANT
PAD ARMBOARD 7.5X6 YLW CONV (MISCELLANEOUS) ×3 IMPLANT
SPONGE LAP 18X18 X RAY DECT (DISPOSABLE) IMPLANT
STAPLER VISISTAT 35W (STAPLE) ×3 IMPLANT
STOCKINETTE IMPERVIOUS LG (DRAPES) ×3 IMPLANT
SUT SILK 2 0 (SUTURE) ×2
SUT SILK 2-0 18XBRD TIE 12 (SUTURE) ×1 IMPLANT
SUT VIC AB 1 CTX 27 (SUTURE) ×6 IMPLANT
TOWEL OR 17X26 10 PK STRL BLUE (TOWEL DISPOSABLE) ×3 IMPLANT
TUBE CONNECTING 12'X1/4 (SUCTIONS) ×1
TUBE CONNECTING 12X1/4 (SUCTIONS) ×2 IMPLANT
YANKAUER SUCT BULB TIP NO VENT (SUCTIONS) ×3 IMPLANT

## 2017-06-22 NOTE — Progress Notes (Signed)
Results for DYNA, FIGUEREO (MRN [id number]) as of 06/22/2017 14:15  Ref. Range 06/21/2017 17:03 06/21/2017 21:40 06/22/2017 05:48 06/22/2017 08:15 06/22/2017 12:28  Glucose-Capillary Latest Ref Range: 65 - 99 mg/dL 374 (H) 827 (H) 078 (H) 255 (H) 275 (H)  Noted that blood sugars continue to be greater than 180 mg/dl.  Recommend increasing Lantus to 30 units BID.  Continue other insulin orders as written. Will continue to monitor blood sugars while in the hospital.  Smith Mince RN BSN CDE Diabetes Coordinator Pager: [phone]  8am-5pm

## 2017-06-22 NOTE — Anesthesia Procedure Notes (Signed)
Procedure Name: LMA Insertion Date/Time: 06/22/2017 7:37 AM Performed by: Army Fossa Pre-anesthesia Checklist: Patient identified, Emergency Drugs available, Suction available and Patient being monitored Patient Re-evaluated:Patient Re-evaluated prior to induction Oxygen Delivery Method: Circle System Utilized Preoxygenation: Pre-oxygenation with 100% oxygen Induction Type: IV induction Ventilation: Mask ventilation without difficulty LMA: LMA inserted LMA Size: 3.0 Number of attempts: 1 Airway Equipment and Method: Bite block Placement Confirmation: positive ETCO2 Tube secured with: Tape Dental Injury: Teeth and Oropharynx as per pre-operative assessment

## 2017-06-22 NOTE — Progress Notes (Signed)
Patient is alert and oriented. Has been NPO since midnight for prodedure to be done at 7am. Patient has not signed informed consent because she needs doctor to talk to her more about the procedure. Called that doctor will talk to her at the OR before procedure  Patient has her Hibiclens bath in readiness for the procedure.  Awaiting transport to the OR at this time.

## 2017-06-22 NOTE — Progress Notes (Signed)
PROGRESS NOTE  [name]:[id number] DOB: [birth year] DOA: 06/18/2017 PCP: Loyal Jacobson, MD   LOS: 4 days   Brief Narrative / Interim history: 56 year old female with past medical history significant for IDDM, poorly controlled, right BKA, chronic diastolic CHF, gastroparesis, and cerebrovascular disease who presents with left foot cellulitis.  Was evaluated in ER by Dr. Lajoyce Corners who recommends left BKA on Friday with pre-operative broad spectrum antibiotics.  Assessment & Plan: Active Problems:   Diabetes mellitus type 2, uncontrolled, with complications (HCC)   Cellulitis   Chronic diastolic heart failure (HCC)   Diabetic foot ulcer (HCC)   Hyperglycemia   CKD (chronic kidney disease), stage III (HCC)   GERD (gastroesophageal reflux disease)   Abnormal urinalysis   Diabetic gastroparesis (HCC)   History of TIA (transient ischemic attack)   Diabetic foot infection -Orthopedics consulted, appreciate recommendations, status post left BKA this morning -Preoperative vancomycin and cefepime, hold ACE inhibitor preop -PT to evaluate post op, likely needs SNF  Diabetes -Poorly controlled, BG 801 mg/dL admission, started on Lantus 10 and SSI.  Somewhat improved with fluids. -Change glargine to 25 units twice daily (50 total per day), increase mealtime coverage to 14 units plus sliding scale  AKI versus CKD -Looks like CKD stage III is her baseline, but baseline unclear, possibly around 1.3.  Improved overnight with fluids. -Creatinine stable, continue to monitor  Hypertension, chronic diastolic CHF, CV disease (TIA) secondary prevention: -Euvolemic, hypertensive.  Echocardiogram EF 60%, grade 1 DD -Continue carvedilol, Lasix -Hold ACE inhibitor preop  Other medications -Continue Reglan -Continue Prozac   DVT prophylaxis: heparin Code Status: Full code Family Communication: d/w mother, brother bedside Disposition Plan: SNF 2-3 days  Consultants:   Orthopedic  surgery   Procedures:   None   Antimicrobials:  Vancomycin 10/29 >>  Cefepime 10/29 >>    Subjective: -seen post op, still groggy from anesthesia   Objective: Vitals:   06/22/17 0855 06/22/17 0915 06/22/17 0930 06/22/17 0932  BP:   (!) 133/54   Pulse: 70 65 65 69  Resp: 11 13 14 15   Temp:    (!) 97 F (36.1 C)  TempSrc:      SpO2: 97% 97% 97% 98%  Weight:      Height:        Intake/Output Summary (Last 24 hours) at 06/22/17 1319 Last data filed at 06/22/17 0814  Gross per 24 hour  Intake              350 ml  Output              370 ml  Net              -20 ml   Filed Weights   06/18/17 0957 06/21/17 0500 06/22/17 0457  Weight: 97.5 kg (215 lb) 91.7 kg (202 lb 3.2 oz) 91.4 kg (201 lb 8 oz)    Examination:  Constitutional: NAD Respiratory: CTA  Cardiovascular: RRR  Data Reviewed: I have independently reviewed following labs and imaging studies   CBC:  Recent Labs Lab 06/18/17 1010 06/19/17 0601 06/21/17 0703 06/22/17 0403  WBC 9.3 5.7 4.3 4.3  HGB 10.5* 8.6* 9.9* 9.3*  HCT 30.3* 26.3* 29.6* 27.8*  MCV 83.0 83.0 82.7 82.5  PLT 365 245 308 283   Basic Metabolic Panel:  Recent Labs Lab 06/18/17 1010 06/18/17 1718 06/18/17 2240 06/19/17 0601 06/21/17 0703 06/22/17 0403  NA 125*  --  130* 132* 136 139  K 5.0  --  5.2* 4.7 4.0 4.6  CL 87*  --  94* 99* 100* 103  CO2 22  --  26 25 26 27   GLUCOSE 801*  --  584* 406* 284* 249*  BUN 44*  --  39* 33* 19 22*  CREATININE 1.81*  --  1.64* 1.44* 1.16* 1.15*  CALCIUM 9.4  --  8.8* 8.9 9.2 9.4  MG  --  1.9  --   --   --   --   PHOS  --  2.7  --   --   --   --    GFR: Estimated Creatinine Clearance: 61.8 mL/min (A) (by C-G formula based on SCr of 1.15 mg/dL (H)). Liver Function Tests:  Recent Labs Lab 06/19/17 0601  AST 11*  ALT 11*  ALKPHOS 87  BILITOT 0.3  PROT 6.0*  ALBUMIN 2.2*   No results for input(s): LIPASE, AMYLASE in the last 168 hours. No results for input(s): AMMONIA in the  last 168 hours. Coagulation Profile:  Recent Labs Lab 06/18/17 1718  INR 1.13   Cardiac Enzymes: No results for input(s): CKTOTAL, CKMB, CKMBINDEX, TROPONINI in the last 168 hours. BNP (last 3 results) No results for input(s): PROBNP in the last 8760 hours. HbA1C: No results for input(s): HGBA1C in the last 72 hours. CBG:  Recent Labs Lab 06/21/17 1703 06/21/17 2140 06/22/17 0548 06/22/17 0815 06/22/17 1228  GLUCAP 367* 325* 248* 255* 275*   Lipid Profile: No results for input(s): CHOL, HDL, LDLCALC, TRIG, CHOLHDL, LDLDIRECT in the last 72 hours. Thyroid Function Tests: No results for input(s): TSH, T4TOTAL, FREET4, T3FREE, THYROIDAB in the last 72 hours. Anemia Panel: No results for input(s): VITAMINB12, FOLATE, FERRITIN, TIBC, IRON, RETICCTPCT in the last 72 hours. Urine analysis:    Component Value Date/Time   COLORURINE YELLOW 06/18/2017 1310   APPEARANCEUR HAZY (A) 06/18/2017 1310   LABSPEC 1.018 06/18/2017 1310   PHURINE 5.0 06/18/2017 1310   GLUCOSEU >=500 (A) 06/18/2017 1310   HGBUR MODERATE (A) 06/18/2017 1310   HGBUR negative 03/25/2010 1147   BILIRUBINUR NEGATIVE 06/18/2017 1310   KETONESUR NEGATIVE 06/18/2017 1310   PROTEINUR 30 (A) 06/18/2017 1310   UROBILINOGEN 0.2 12/07/2014 2125   NITRITE NEGATIVE 06/18/2017 1310   LEUKOCYTESUR TRACE (A) 06/18/2017 1310   Sepsis Labs: Invalid input(s): PROCALCITONIN, LACTICIDVEN  Recent Results (from the past 240 hour(s))  Culture, blood (routine x 2)     Status: None (Preliminary result)   Collection Time: 06/18/17 11:50 AM  Result Value Ref Range Status   Specimen Description BLOOD LEFT HAND  Final   Special Requests   Final    BOTTLES DRAWN AEROBIC AND ANAEROBIC Blood Culture adequate volume   Culture NO GROWTH 3 DAYS  Final   Report Status PENDING  Incomplete  Culture, blood (routine x 2)     Status: None (Preliminary result)   Collection Time: 06/18/17  1:00 PM  Result Value Ref Range Status    Specimen Description BLOOD LEFT WRIST  Final   Special Requests   Final    BOTTLES DRAWN AEROBIC AND ANAEROBIC Blood Culture adequate volume   Culture NO GROWTH 3 DAYS  Final   Report Status PENDING  Incomplete  Culture, Urine     Status: None   Collection Time: 06/18/17  5:52 PM  Result Value Ref Range Status   Specimen Description URINE, CATHETERIZED  Final   Special Requests NONE  Final   Culture NO GROWTH  Final   Report Status 06/19/2017 FINAL  Final  MRSA PCR Screening     Status: None   Collection Time: 06/19/17  1:55 PM  Result Value Ref Range Status   MRSA by PCR NEGATIVE NEGATIVE Final    Comment:        The GeneXpert MRSA Assay (FDA approved for NASAL specimens only), is one component of a comprehensive MRSA colonization surveillance program. It is not intended to diagnose MRSA infection nor to guide or monitor treatment for MRSA infections.       Radiology Studies: No results found.   Scheduled Meds: . carvedilol  3.125 mg Oral BID WC  . clopidogrel  75 mg Oral Daily  . docusate sodium  100 mg Oral BID  . FLUoxetine  40 mg Oral Daily  . furosemide  20 mg Oral Daily  . heparin  5,000 Units Subcutaneous Q8H  . HYDROmorphone      . HYDROmorphone      . insulin aspart  0-20 Units Subcutaneous TID WC  . insulin aspart  0-5 Units Subcutaneous QHS  . insulin aspart  14 Units Subcutaneous TID WC  . insulin glargine  25 Units Subcutaneous BID  . levothyroxine  50 mcg Oral BH-q7a  . methocarbamol      . metoCLOPramide  10 mg Oral QID  . oxybutynin  10 mg Oral Daily  . rosuvastatin  40 mg Oral Daily   Continuous Infusions: . sodium chloride 75 mL/hr at 06/21/17 1819  . sodium chloride 10 mL/hr at 06/22/17 0930  . ceFEPime (MAXIPIME) IV Stopped (06/21/17 2309)  . methocarbamol (ROBAXIN)  IV    . vancomycin Stopped (06/22/17 0630)    Pamella Pert, MD, PhD [facility] Pager [phone]  If 7PM-7AM, please contact  night-coverage [email or web address] Password [email or web address] 06/22/2017, 1:19 PM

## 2017-06-22 NOTE — Anesthesia Postprocedure Evaluation (Signed)
Anesthesia Post Note  Patient: [name]  Procedure(s) Performed: LEFT BELOW KNEE AMPUTATION (Left Leg Lower)     Patient location during evaluation: PACU Anesthesia Type: General Level of consciousness: awake and alert Pain management: pain level controlled Vital Signs Assessment: post-procedure vital signs reviewed and stable Respiratory status: spontaneous breathing, nonlabored ventilation, respiratory function stable and patient connected to nasal cannula oxygen Cardiovascular status: blood pressure returned to baseline and stable Postop Assessment: no apparent nausea or vomiting Anesthetic complications: no    Last Vitals:  Vitals:   06/22/17 0930 06/22/17 0932  BP: (!) 133/54   Pulse: 65 69  Resp: 14 15  Temp:  (!) 36.1 C  SpO2: 97% 98%    Last Pain:  Vitals:   06/22/17 0855  TempSrc:   PainSc: 10-Worst pain ever                 Kennieth Rad

## 2017-06-22 NOTE — Progress Notes (Signed)
PT Cancellation Note  Patient Details Name: [name] MRN: [id number] DOB: 1960-12-29   Cancelled Treatment:    Reason Eval/Treat Not Completed: Medical issues which prohibited therapy (orders received pt s/p BKA today and will evaluate next date)   [name] 06/22/2017, 12:38 PM  Delaney Meigs, PT [phone]

## 2017-06-22 NOTE — Interval H&P Note (Signed)
History and Physical Interval Note:  06/22/2017 6:34 AM  [name]  has presented today for surgery, with the diagnosis of Abscess Left Foot  The various methods of treatment have been discussed with the patient and family. After consideration of risks, benefits and other options for treatment, the patient has consented to  Procedure(s): LEFT BELOW KNEE AMPUTATION (Left) as a surgical intervention .  The patient's history has been reviewed, patient examined, no change in status, stable for surgery.  I have reviewed the patient's chart and labs.  Questions were answered to the patient's satisfaction.     Nadara Mustard

## 2017-06-22 NOTE — Care Management Note (Addendum)
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          Eric Form (Brother) Elder Cyphers (Mother)    [phone]     PCP: Loyal Jacobson  Action/Plan: CM received consult for home health needs, equipment , medication needs. PT/OT  Evaluations pending .... CM to f/u with disposition needs.  Expected Discharge Date:                  Expected Discharge Plan:     In-House Referral:     Discharge planning Services  CM Consult  Post Acute Care Choice:    Choice offered to:     DME Arranged:    DME Agency:     HH Arranged:    HH Agency:     Status of Service:  In process, will continue to follow  If discussed at Long Length of Stay Meetings, dates discussed:    Additional Comments:  Epifanio Lesches, RN 06/22/2017, 12:03 PM

## 2017-06-22 NOTE — H&P (View-Only) (Signed)
  ORTHOPAEDIC CONSULTATION  REQUESTING PHYSICIAN: Merrell, David J, MD  Chief Complaint: Abscess cellulitis purulent drainage left foot  HPI: [name] is a 55 y.o. female who presents with diabetic insensate neuropathy she is status post transtibial amputation of the right as well as multiple right and toe amputations on the left.  Patient is unsure when she is started developing redness swelling odor and drainage of the left foot.  Past Medical History:  Diagnosis Date  . Anxiety   . Cellulitis   . Chronic diastolic (congestive) heart failure (HCC)   . Complication of anesthesia 01/2013   "didn't know where I was; who I was; talking out the top of my head" (04/21/2013)  . Concussion as a teenager   slight  . COPD (chronic obstructive pulmonary disease) (HCC)    wear oxygen at home  . Depression   . Diabetic neuropathy (HCC)   . GERD (gastroesophageal reflux disease)   . Hyperlipidemia   . Hypertension   . Hypothyroidism    takes Synthroid daily  . MRSA (methicillin resistant staph aureus) culture positive    hx of 2012  . Neuromuscular disorder (HCC)   . OSA on CPAP    and oxygen  . Peripheral vascular disease (HCC)   . Staph infection 2007  . Symptomatic bradycardia 03/04/2014  . TIA (transient ischemic attack)   . Type II diabetes mellitus (HCC)    Past Surgical History:  Procedure Laterality Date  . BLADDER SURGERY  1970's   "stretched the mouth of my bladder" (04/21/2013)  . DILATION AND CURETTAGE OF UTERUS  1980's  . FOOT SURGERY Right 2012   "put pins in one year; amputated toes another OR" (04/21/2013)  . GANGLION CYST EXCISION Left 1990's   "wrist" (04/21/2013)  . I&D EXTREMITY Left 08/25/2013   Procedure: IRRIGATION AND DEBRIDEMENT EXTREMITY;  Surgeon: Armando Ramirez, MD;  Location: [facility] OR;  Service: General;  Laterality: Left;  . INCISION AND DRAINAGE ABSCESS N/A 01/24/2013   Procedure: INCISION AND DRAINAGE ABSCESS;  Surgeon: Thomas A. Cornett, MD;   Location: [facility] OR;  Service: General;  Laterality: N/A;  . INGUINAL HERNIA REPAIR N/A 01/28/2013   Procedure: I&D Right Groin Wound;  Surgeon: Douglas A Blackman, MD;  Location: [facility] OR;  Service: General;  Laterality: N/A;  . LEG AMPUTATION BELOW KNEE Right 2012  . PERMANENT PACEMAKER INSERTION N/A 03/05/2014   Biotronik dual chamber pacemaker implanted by Dr Klein for symptomatic bradycardia  . REFRACTIVE SURGERY Bilateral 2014  . STUMP REVISION Right 12/11/2014   Procedure: Right Below Knee Amputation Revision;  Surgeon: Orean Giarratano V, MD;  Location: [facility] OR;  Service: Orthopedics;  Laterality: Right;  . TOE AMPUTATION Left ?2011    only 2 toes remaning.   . TUBAL LIGATION  1990's  . WRIST SURGERY Right 1980's   "pinched nerve" (04/21/2013)   Social History   Social History  . Marital status: Single    Spouse name: N/A  . Number of children: N/A  . Years of education: N/A   Social History Main Topics  . Smoking status: Current Every Day Smoker    Packs/day: 0.50    Years: 33.00    Types: Cigarettes  . Smokeless tobacco: Never Used  . Alcohol use No  . Drug use: No  . Sexual activity: No   Other Topics Concern  . None   Social History Narrative  . None   Family History  Problem Relation Age of Onset  . Hypertension   Mother   . Hyperlipidemia Mother   . Hypertrophic cardiomyopathy Mother        has pacer  . Alzheimer's disease Father   . Hypertension Brother   . Hyperlipidemia Brother   . Cancer Maternal Aunt   . Diabetes Paternal Aunt   . Diabetes Paternal Uncle   . Diabetes Paternal Grandmother   . Anesthesia problems Neg Hx   . Hypotension Neg Hx   . Malignant hyperthermia Neg Hx   . Pseudochol deficiency Neg Hx    - negative except otherwise stated in the family history section Allergies  Allergen Reactions  . Erythromycin Diarrhea  . Iodine-131 Nausea And Vomiting  . Gadolinium Nausea And Vomiting     Code: VOM, Desc: Pt began vomiting immed post infusion of  multihance, Onset Date: [id number]   . Ivp Dye [Iodinated Diagnostic Agents] Nausea And Vomiting       . Metformin Diarrhea  . Penicillins Hives    Has patient had a PCN reaction causing immediate rash, facial/tongue/throat swelling, SOB or lightheadedness with hypotension: No Has patient had a PCN reaction causing severe rash involving mucus membranes or skin necrosis: No Has patient had a PCN reaction that required hospitalization No Has patient had a PCN reaction occurring within the last 10 years: No If all of the above answers are "NO", then may proceed with Cephalosporin use.    Prior to Admission medications   Medication Sig Start Date End Date Taking? Authorizing Provider  carvedilol (COREG) 3.125 MG tablet Take 1 tablet (3.125 mg total) by mouth 2 (two) times daily with a meal. 10/25/15  Yes Rolly Salter, MD  clopidogrel (PLAVIX) 75 MG tablet Take 1 tablet (75 mg total) by mouth daily. 07/24/16  Yes Ghimire, Werner Lean, MD  EASY COMFORT LANCETS MISC test blood sugar THREE TIMES DAILY 09/23/16  Yes [provider]  fenofibrate 160 MG tablet Take 1 tablet (160 mg total) by mouth daily. 07/24/16  Yes Ghimire, Werner Lean, MD  FLUoxetine (PROZAC) 20 MG tablet Take 40 mg by mouth daily.  11/24/15  Yes [provider]  furosemide (LASIX) 40 MG tablet Take 0.5 tablets (20 mg total) by mouth daily. 10/25/15  Yes Rolly Salter, MD  insulin regular (NOVOLIN R,HUMULIN R) 100 units/mL injection Inject 30-60 Units into the skin See admin instructions. 60 units at 0800, 50 units at 1400, 30 units at 2200 - additional as needed   Yes [provider]  levothyroxine (SYNTHROID, LEVOTHROID) 25 MCG tablet Take 2 tablets (50 mcg total) by mouth every morning. 10/25/15  Yes Rolly Salter, MD  lisinopril (PRINIVIL,ZESTRIL) 5 MG tablet Take 5 mg by mouth daily.   Yes [provider]  metoCLOPramide (REGLAN) 10 MG tablet Take 10 mg by mouth 4 (four) times daily. Take 1 tablet (10  mg) by mouth 30 minutes before meals and at bedtime 11/25/15  Yes [provider]  oxybutynin (DITROPAN-XL) 5 MG 24 hr tablet Take 10 mg by mouth daily.  11/22/15  Yes [provider]  rosuvastatin (CRESTOR) 20 MG tablet Take 40 mg by mouth daily.  11/22/15  Yes [provider]  amLODipine (NORVASC) 10 MG tablet Take 1 tablet (10 mg total) by mouth daily. Patient not taking: Reported on 06/18/2017 12/08/15   Leroy Sea, MD  doxycycline (VIBRA-TABS) 100 MG tablet Take 1 tablet (100 mg total) by mouth 2 (two) times daily. Patient not taking: Reported on 06/18/2017 04/05/17   Nadara Mustard, MD  meclizine (ANTIVERT) 25 MG tablet Take 1 tablet (25 mg total) by mouth 3 (three) times daily as needed for dizziness. Patient not taking: Reported on 06/18/2017 07/23/16   Maretta Bees, MD  silver sulfADIAZINE (SILVADENE) 1 % cream Apply to wound daily Patient not taking: Reported on 06/18/2017 09/14/16   Adonis Huguenin, NP  sulfamethoxazole-trimethoprim (BACTRIM DS,SEPTRA DS) 800-160 MG tablet Take 1 tablet by mouth 2 (two) times daily. Patient not taking: Reported on 06/18/2017 12/29/16   Adonis Huguenin, NP   Dg Foot Complete Left  Result Date: 06/18/2017 CLINICAL DATA:  Open foot wounds.  Evaluate for osteomyelitis. EXAM: LEFT FOOT - COMPLETE 3+ VIEW COMPARISON:  Left foot x-rays dated December 07, 2014. FINDINGS: Postsurgical changes related to prior amputation of the great toe and first metatarsal, as well as the second and third toes. No acute fracture or malalignment. No definite osteolysis or cortical destruction. Mild chronic cortical thickening of the second metatarsal shaft is unchanged. Severe midfoot degenerative changes are again noted. Diffuse soft tissue swelling of the forefoot with focal lucency at along the plantar aspect at the base of the medial cuneiform. IMPRESSION: 1. Plantar ulceration at the base of the medial cuneiform with diffuse soft tissue swelling of the  forefoot, consistent with cellulitis. No radiographic evidence of osteomyelitis. 2. Stable midfoot neuropathic changes and postsurgical changes from prior amputations. Electronically Signed   By: Obie Dredge M.D.   On: 06/18/2017 12:46   - pertinent xrays, CT, MRI studies were reviewed and independently interpreted  Positive ROS: All other systems have been reviewed and were otherwise negative with the exception of those mentioned in the HPI and as above.  Physical Exam: General: Alert, no acute distress Psychiatric: Patient is competent for consent with normal mood and affect Lymphatic: No axillary or cervical lymphadenopathy Cardiovascular: No pedal edema Respiratory: No cyanosis, no use of accessory musculature GI: No organomegaly, abdomen is soft and non-tender  Skin: Examination patient has swelling of the forefoot and midfoot with cellulitis and purulent drainage with ulceration dorsally on the foot as well as ulceration plantarly.   Neurologic: Patient does not have protective sensation bilateral lower extremities.   MUSCULOSKELETAL:  Patient does not have a palpable dorsalis pedis pulse.  She does not have cellulitis extending proximal to the ankle.  There is foul-smelling purulent drainage from the abscess of the foot with swelling to the ankle.  Assessment: Assessment: Diabetic insensate neuropathy status post foot salvage intervention to the left foot with a right transtibial amputation with abscess cellulitis swelling and purulent drainage left foot.  Plan: Plan: Patient does not have sufficient soft tissue to consider further foot salvage intervention on the left.  Patient will need a transtibial amputation on the left.  She will need to continue her IV antibiotics decrease the systemic bacteremia.  We will plan for a left transtibial amputation on Friday.  Patient will need discharge to skilled nursing postoperatively.  Thank you for the consult and the opportunity to  see Ms. Owens Shark, MD [facility] [phone] 6:12 PM

## 2017-06-22 NOTE — Transfer of Care (Signed)
Immediate Anesthesia Transfer of Care Note  Patient: [name]  Procedure(s) Performed: LEFT BELOW KNEE AMPUTATION (Left Leg Lower)  Patient Location: PACU  Anesthesia Type:General  Level of Consciousness: drowsy and patient cooperative  Airway & Oxygen Therapy: Patient Spontanous Breathing and Patient connected to face mask oxygen  Post-op Assessment: Report given to RN and Post -op Vital signs reviewed and stable  Post vital signs: Reviewed and stable  Last Vitals:  Vitals:   06/21/17 2141 06/22/17 0551  BP: (!) 142/63 (!) 141/66  Pulse: 66 87  Resp: 18   Temp: 36.8 C 36.9 C  SpO2: 94% 99%    Last Pain:  Vitals:   06/22/17 0551  TempSrc: Oral  PainSc:          Complications: No apparent anesthesia complications

## 2017-06-22 NOTE — Op Note (Signed)
   Date of Surgery: 06/22/2017  INDICATIONS: [name] is a 56 y.o.-year-old female who is status post right transtibial amputation who presents with abscess cellulitis osteomyelitis of the entire left foot.  PREOPERATIVE DIAGNOSIS: Abscess cellulitis osteomyelitis left foot  POSTOPERATIVE DIAGNOSIS: Same.  PROCEDURE: Transtibial amputation Application of Prevena wound VAC  SURGEON: Lajoyce Corners, M.D.  ANESTHESIA:  general  IV FLUIDS AND URINE: See anesthesia.  ESTIMATED BLOOD LOSS: min mL. Tourniquet time 4 minutes  COMPLICATIONS: None.  DESCRIPTION OF PROCEDURE: The patient was brought to the operating room and underwent a general anesthetic. After adequate levels of anesthesia were obtained patient's lower extremity was prepped using DuraPrep draped into a sterile field. A timeout was called. The foot was draped out of the sterile field with impervious stockinette. A transverse incision was made 11 cm distal to the tibial tubercle. This curved proximally and a large posterior flap was created. The tibia was transected 1 cm proximal to the skin incision. The fibula was transected just proximal to the tibial incision. The tibia was beveled anteriorly. A large posterior flap was created. The sciatic nerve was pulled cut and allowed to retract. The vascular bundles were suture ligated with 2-0 silk. The deep and superficial fascial layers were closed using #1 Vicryl. The skin was closed using staples and 2-0 nylon. The wound was covered with a Prevena wound VAC. There was a good suction fit.  Patient was extubated taken to the PACU in stable condition.  Aldean Baker, MD [facility] 8:10 AM

## 2017-06-23 ENCOUNTER — Encounter (HOSPITAL_COMMUNITY): Payer: Self-pay | Admitting: Orthopedic Surgery

## 2017-06-23 LAB — BASIC METABOLIC PANEL
ANION GAP: 10 (ref 5–15)
BUN: 20 mg/dL (ref 6–20)
CALCIUM: 8.8 mg/dL — AB (ref 8.9–10.3)
CO2: 23 mmol/L (ref 22–32)
Chloride: 101 mmol/L (ref 101–111)
Creatinine, Ser: 1.35 mg/dL — ABNORMAL HIGH (ref 0.44–1.00)
GFR calc Af Amer: 50 mL/min — ABNORMAL LOW (ref >60)
GFR, EST NON AFRICAN AMERICAN: 43 mL/min — AB (ref >60)
GLUCOSE: 278 mg/dL — AB (ref 65–99)
POTASSIUM: 3.9 mmol/L (ref 3.5–5.1)
SODIUM: 134 mmol/L — AB (ref 135–145)

## 2017-06-23 LAB — CBC
HCT: 26.5 % — ABNORMAL LOW (ref 36.0–46.0)
Hemoglobin: 8.7 g/dL — ABNORMAL LOW (ref 12.0–15.0)
MCH: 27.3 pg (ref 26.0–34.0)
MCHC: 32.8 g/dL (ref 30.0–36.0)
MCV: 83.1 fL (ref 78.0–100.0)
PLATELETS: 262 10*3/uL (ref 150–400)
RBC: 3.19 MIL/uL — AB (ref 3.87–5.11)
RDW: 12.8 % (ref 11.5–15.5)
WBC: 6.2 10*3/uL (ref 4.0–10.5)

## 2017-06-23 LAB — GLUCOSE, CAPILLARY
GLUCOSE-CAPILLARY: 266 mg/dL — AB (ref 65–99)
GLUCOSE-CAPILLARY: 306 mg/dL — AB (ref 65–99)
GLUCOSE-CAPILLARY: 306 mg/dL — AB (ref 65–99)
Glucose-Capillary: 306 mg/dL — ABNORMAL HIGH (ref 65–99)
Glucose-Capillary: 256 mg/dL — ABNORMAL HIGH (ref 65–99)
Glucose-Capillary: 298 mg/dL — ABNORMAL HIGH (ref 65–99)

## 2017-06-23 LAB — CULTURE, BLOOD (ROUTINE X 2)
CULTURE: NO GROWTH
Culture: NO GROWTH
SPECIAL REQUESTS: ADEQUATE
Special Requests: ADEQUATE

## 2017-06-23 MED ORDER — INSULIN GLARGINE 100 UNIT/ML ~~LOC~~ SOLN
30.0000 [IU] | Freq: Two times a day (BID) | SUBCUTANEOUS | Status: DC
  Administered 2017-06-23 – 2017-06-24 (×2): 30 [IU] via SUBCUTANEOUS
  Filled 2017-06-23 (×3): qty 0.3

## 2017-06-23 MED ORDER — INSULIN ASPART 100 UNIT/ML ~~LOC~~ SOLN
16.0000 [IU] | Freq: Three times a day (TID) | SUBCUTANEOUS | Status: DC
  Administered 2017-06-23 – 2017-06-26 (×8): 16 [IU] via SUBCUTANEOUS

## 2017-06-23 NOTE — Progress Notes (Signed)
PROGRESS NOTE  [name] HGD:[id number] DOB: Nov 30, 1960 DOA: 06/18/2017 PCP: Loyal Jacobson, MD   LOS: 5 days   Brief Narrative / Interim history: 57 year old female with past medical history significant for IDDM, poorly controlled, right BKA, chronic diastolic CHF, gastroparesis, and cerebrovascular disease who presents with left foot cellulitis.  Was evaluated in ER by Dr. Lajoyce Corners who recommends left BKA on Friday with pre-operative broad spectrum antibiotics.  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  Improved overnight with fluids. -Creatinine overall stable but slightly up post op, repeat tomorrow morning  Hypertension, chronic diastolic CHF, CV disease (TIA) secondary prevention: -Euvolemic, hypertensive.  Echocardiogram EF 60%, grade 1 DD -Continue carvedilol, Lasix -Hold ACE inhibitor for now given slight increase in creatinine after the surgery  Other medications -Continue Reglan -Continue Prozac   DVT prophylaxis: heparin Code Status: Full code Family Communication: d/w mother at bedside Disposition Plan: SNF 2-3 days  Consultants:   Orthopedic  surgery   Procedures:   None   Antimicrobials:  Vancomycin 10/29 >>  Cefepime 10/29 >>    Subjective: -No complaints this morning, no chest pain, no shortness of breath.  Objective: Vitals:   06/22/17 2144 06/23/17 0101 06/23/17 0610 06/23/17 1405  BP: (!) 133/43  (!) 127/48 (!) 104/43  Pulse: 69  68 67  Resp: 16  18 19   Temp: 99.1 F (37.3 C)  98.3 F (36.8 C) 98.3 F (36.8 C)  TempSrc: Oral  Oral Oral  SpO2: 95%  93% 94%  Weight:  91 kg (200 lb 9.6 oz)    Height:        Intake/Output Summary (Last 24 hours) at 06/23/17 1437 Last data filed at 06/23/17 0540  Gross per 24 hour  Intake              864 ml  Output                0 ml  Net              864 ml   Filed Weights   06/21/17 0500 06/22/17 0457 06/23/17 0101  Weight: 91.7 kg (202 lb 3.2 oz) 91.4 kg (201 lb 8 oz) 91 kg (200 lb 9.6 oz)    Examination:  Constitutional: NAD, calm, comfortable Eyes: lids and conjunctivae normal ENMT: Mucous membranes are moist.  Respiratory: clear to auscultation bilaterally, no wheezing, no crackles. Cardiovascular: Regular rate and rhythm, no murmurs / rubs / gallops. Abdomen: no tenderness. Bowel sounds positive.  Skin: no rashes, lesions, ulcers. No induration.  Old right BKA, left BKA with clean dressing, wound VAC in place Neurologic: non focal   Data Reviewed: I have independently reviewed following labs and imaging studies   CBC:  Recent  Labs Lab 06/18/17 1010 06/19/17 0601 06/21/17 0703 06/22/17 0403 06/23/17 0623  WBC 9.3 5.7 4.3 4.3 6.2  HGB 10.5* 8.6* 9.9* 9.3* 8.7*  HCT 30.3* 26.3* 29.6* 27.8* 26.5*  MCV 83.0 83.0 82.7 82.5 83.1  PLT 365 245 308 283 262   Basic Metabolic Panel:  Recent Labs Lab 06/18/17 1718 06/18/17 2240 06/19/17 0601 06/21/17 0703 06/22/17 0403 06/23/17 0623  NA  --  130* 132* 136 139 134*  K  --  5.2* 4.7 4.0 4.6 3.9  CL  --  94* 99* 100* 103 101  CO2  --  26 25 26 27 23   GLUCOSE  --  584* 406* 284* 249* 278*    BUN  --  39* 33* 19 22* 20  CREATININE  --  1.64* 1.44* 1.16* 1.15* 1.35*  CALCIUM  --  8.8* 8.9 9.2 9.4 8.8*  MG 1.9  --   --   --   --   --   PHOS 2.7  --   --   --   --   --    GFR: Estimated Creatinine Clearance: 52.5 mL/min (A) (by C-G formula based on SCr of 1.35 mg/dL (H)). Liver Function Tests:  Recent Labs Lab 06/19/17 0601  AST 11*  ALT 11*  ALKPHOS 87  BILITOT 0.3  PROT 6.0*  ALBUMIN 2.2*   No results for input(s): LIPASE, AMYLASE in the last 168 hours. No results for input(s): AMMONIA in the last 168 hours. Coagulation Profile:  Recent Labs Lab 06/18/17 1718  INR 1.13   Cardiac Enzymes: No results for input(s): CKTOTAL, CKMB, CKMBINDEX, TROPONINI in the last 168 hours. BNP (last 3 results) No results for input(s): PROBNP in the last 8760 hours. HbA1C: No results for input(s): HGBA1C in the last 72 hours. CBG:  Recent Labs Lab 06/22/17 2021 06/23/17 0059 06/23/17 0418 06/23/17 0742 06/23/17 1150  GLUCAP 260* 306* 306* 266* 306*   Lipid Profile: No results for input(s): CHOL, HDL, LDLCALC, TRIG, CHOLHDL, LDLDIRECT in the last 72 hours. Thyroid Function Tests: No results for input(s): TSH, T4TOTAL, FREET4, T3FREE, THYROIDAB in the last 72 hours. Anemia Panel: No results for input(s): VITAMINB12, FOLATE, FERRITIN, TIBC, IRON, RETICCTPCT in the last 72 hours. Urine analysis:    Component Value Date/Time   COLORURINE YELLOW 06/18/2017 1310   APPEARANCEUR HAZY (A) 06/18/2017 1310   LABSPEC 1.018 06/18/2017 1310   PHURINE 5.0 06/18/2017 1310   GLUCOSEU >=500 (A) 06/18/2017 1310   HGBUR MODERATE (A) 06/18/2017 1310   HGBUR negative 03/25/2010 1147   BILIRUBINUR NEGATIVE 06/18/2017 1310   KETONESUR NEGATIVE 06/18/2017 1310   PROTEINUR 30 (A) 06/18/2017 1310   UROBILINOGEN 0.2 12/07/2014 2125   NITRITE NEGATIVE 06/18/2017 1310   LEUKOCYTESUR TRACE (A) 06/18/2017 1310   Sepsis Labs: Invalid input(s): PROCALCITONIN, LACTICIDVEN  Recent Results  (from the past 240 hour(s))  Culture, blood (routine x 2)     Status: None (Preliminary result)   Collection Time: 06/18/17 11:50 AM  Result Value Ref Range Status   Specimen Description BLOOD LEFT HAND  Final   Special Requests   Final    BOTTLES DRAWN AEROBIC AND ANAEROBIC Blood Culture adequate volume   Culture NO GROWTH 4 DAYS  Final   Report Status PENDING  Incomplete  Culture, blood (routine x 2)     Status: None (Preliminary result)   Collection Time: 06/18/17  1:00 PM  Result Value Ref Range Status   Specimen Description BLOOD LEFT WRIST  Final  Special Requests   Final    BOTTLES DRAWN AEROBIC AND ANAEROBIC Blood Culture adequate volume   Culture NO GROWTH 4 DAYS  Final   Report Status PENDING  Incomplete  Culture, Urine     Status: None   Collection Time: 06/18/17  5:52 PM  Result Value Ref Range Status   Specimen Description URINE, CATHETERIZED  Final   Special Requests NONE  Final   Culture NO GROWTH  Final   Report Status 06/19/2017 FINAL  Final  MRSA PCR Screening     Status: None   Collection Time: 06/19/17  1:55 PM  Result Value Ref Range Status   MRSA by PCR NEGATIVE NEGATIVE Final    Comment:        The GeneXpert MRSA Assay (FDA approved for NASAL specimens only), is one component of a comprehensive MRSA colonization surveillance program. It is not intended to diagnose MRSA infection nor to guide or monitor treatment for MRSA infections.       Radiology Studies: No results found.   Scheduled Meds: . carvedilol  3.125 mg Oral BID WC  . clopidogrel  75 mg Oral Daily  . docusate sodium  100 mg Oral BID  . FLUoxetine  40 mg Oral Daily  . furosemide  20 mg Oral Daily  . heparin  5,000 Units Subcutaneous Q8H  . insulin aspart  0-20 Units Subcutaneous TID WC  . insulin aspart  0-5 Units Subcutaneous QHS  . insulin aspart  14 Units Subcutaneous TID WC  . insulin glargine  25 Units Subcutaneous BID  . levothyroxine  50 mcg Oral BH-q7a  .  metoCLOPramide  10 mg Oral QID  . oxybutynin  10 mg Oral Daily  . rosuvastatin  40 mg Oral Daily   Continuous Infusions: . sodium chloride 75 mL/hr at 06/21/17 1819  . sodium chloride 10 mL/hr at 06/22/17 0930  . ceFEPime (MAXIPIME) IV Stopped (06/23/17 0926)  . methocarbamol (ROBAXIN)  IV      Pamella Pert, MD, PhD [facility] Pager [phone]  If 7PM-7AM, please contact night-coverage [email or web address] Password [facility] 06/23/2017, 2:37 PM

## 2017-06-23 NOTE — Progress Notes (Signed)
Pt refusing CPAP at this time. She states she does not wear at home either. I told her to call if she changes her mind

## 2017-06-23 NOTE — Evaluation (Signed)
Physical Therapy Evaluation Patient Details Name: [name] MRN: [id number] DOB: August 01, 1961 Today's Date: 06/23/2017   History of Present Illness  56 y.o. female with medical history significant of diabetes, TIA, PVD, OSA on CPAP, hypothyroidism, hypertension, hyperlipidemia, GERD, diabetic neuropathy, depression, COPD, numerous previous lower extremity amputations.   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  Pt educated on keeping knee straight and importance of good diet to control diabetes (her caregiver needs more education as she does the shopping).  Pt lives alone - unable to go home at this time due to limited caregiver assist (has  1-2 hours a day 7 days a week).  Pt will benefit from SNF level rehab to get as strong as she can prior to going home.    Follow Up Recommendations SNF    Equipment Recommendations  None recommended by PT    Recommendations for Other Services OT consult     Precautions / Restrictions Precautions Precautions: Fall Restrictions LLE Weight Bearing: Non weight bearing Other Position/Activity Restrictions: pt with new left BKA - no weight bearing on left      Mobility  Bed Mobility Overal bed mobility: Needs Assistance Bed Mobility: Supine to Sit     Supine to sit: Mod assist;HOB elevated        Transfers Overall transfer level: Needs assistance   Transfers: Licensed conveyancer transfers: Min assist;+2 safety/equipment   General transfer comment: pt given cues for technque. she did better than she and i thought she would do.    Ambulation/Gait                Stairs            Wheelchair Mobility    Modified Rankin (Stroke Patients Only)       Balance Overall balance assessment: Independent (static sitting balance doing OK)                                            Pertinent Vitals/Pain Pain Assessment: 0-10 Pain Score: 8  (pt was hurting 10/10 - now hurting 8/10 after meds) Pain Descriptors / Indicators: Burning;Grimacing;Penetrating;Radiating Pain Intervention(s): Premedicated before session;Monitored during session    Home Living Family/patient expects to be discharged to:: Private residence Living Arrangements: Alone Available Help at Discharge: Personal care attendant;Available PRN/intermittently         Home Layout: One level Home Equipment: Grab bars - tub/shower;Bedside commode;Shower seat;Wheelchair - manual;Hand held Careers information officer - 2 wheels;Cane - single point Additional Comments: aide 7 days week 1-2 hours/ aide does shopping.  pt said she will have more hours now that Bilateral    Prior Function Level of Independence: Independent with assistive device(s)         Comments: pt uses wheelchair - was told to stay off the left leg so has not been doing any walking     Hand Dominance        Extremity/Trunk Assessment   Upper Extremity Assessment Upper Extremity Assessment: Generalized weakness    Lower Extremity Assessment Lower Extremity Assessment:  (reviewed with pt importance of keeping knees straight and preventing contractures)    Cervical / Trunk Assessment Cervical / Trunk Assessment: Normal  Communication  Communication: No difficulties (pt with diabetic neuropathy - cant read food labels etc)  Cognition Arousal/Alertness: Awake/alert Behavior During Therapy: Flat affect Overall Cognitive Status: History of cognitive impairments - at baseline                                 General Comments: Pt with flat affect.  mom present for hospital stay - she lives in [place]      General Comments General comments (skin integrity, edema, etc.): pt with scab on right knee - would recommend reeval by prothesist to make sure good fit    Exercises     Assessment/Plan     PT Assessment Patient needs continued PT services  PT Problem List Decreased activity tolerance;Decreased mobility;Decreased knowledge of use of DME;Decreased safety awareness       PT Treatment Interventions DME instruction;Functional mobility training;Therapeutic activities;Patient/family education    PT Goals (Current goals can be found in the Care Plan section)  Acute Rehab PT Goals Patient Stated Goal: to get back home PT Goal Formulation: With patient/family Time For Goal Achievement: 07/07/17 Potential to Achieve Goals: Fair    Frequency Min 3X/week   Barriers to discharge Decreased caregiver support pt has limited aide time a day    Co-evaluation               AM-PAC PT "6 Clicks" Daily Activity  Outcome Measure Difficulty turning over in bed (including adjusting bedclothes, sheets and blankets)?: A Little Difficulty moving from lying on back to sitting on the side of the bed? : A Little Difficulty sitting down on and standing up from a chair with arms (e.g., wheelchair, bedside commode, etc,.)?: Unable Help needed moving to and from a bed to chair (including a wheelchair)?: A Little Help needed walking in hospital room?: Total Help needed climbing 3-5 steps with a railing? : Total 6 Click Score: 12    End of Session   Activity Tolerance: Patient tolerated treatment well Patient left: in chair;with family/visitor present;with call bell/phone within reach Nurse Communication: Mobility status;Need for lift equipment      Time: 1240-1317 PT Time Calculation (min) (ACUTE ONLY): 37 min   Charges:   PT Evaluation $PT Eval Moderate Complexity: 1 Mod PT Treatments $Therapeutic Activity: 23-37 mins   PT G Codes:        July 11, 2017   [name], PT   [name] 07/11/2017, 1:34 PM

## 2017-06-23 NOTE — Progress Notes (Signed)
Subjective: 1 Day Post-Op Procedure(s) (LRB): LEFT BELOW KNEE AMPUTATION (Left) Patient reports pain as moderate.  Appears comfortable  Objective: Vital signs in last 24 hours: Temp:  [97 F (36.1 C)-99.1 F (37.3 C)] 98.3 F (36.8 C) (11/03 0610) Pulse Rate:  [65-71] 68 (11/03 0610) Resp:  [11-20] 18 (11/03 0610) BP: (108-133)/(43-91) 127/48 (11/03 0610) SpO2:  [93 %-99 %] 93 % (11/03 0610) Weight:  [200 lb 9.6 oz (91 kg)] 200 lb 9.6 oz (91 kg) (11/03 0101)  Intake/Output from previous day: 11/02 0701 - 11/03 0700 In: 1214 [P.O.:864; I.V.:350] Out: 20 [Blood:20] Intake/Output this shift: No intake/output data recorded.   Recent Labs  06/21/17 0703 06/22/17 0403 06/23/17 0623  HGB 9.9* 9.3* 8.7*    Recent Labs  06/22/17 0403 06/23/17 0623  WBC 4.3 6.2  RBC 3.37* 3.19*  HCT 27.8* 26.5*  PLT 283 262    Recent Labs  06/22/17 0403 06/23/17 0623  NA 139 134*  K 4.6 3.9  CL 103 101  CO2 27 23  BUN 22* 20  CREATININE 1.15* 1.35*  GLUCOSE 249* 278*  CALCIUM 9.4 8.8*   No results for input(s): LABPT, INR in the last 72 hours.  Incisional VAC over left BKA stump to suction with good seal  Assessment/Plan: 1 Day Post-Op Procedure(s) (LRB): LEFT BELOW KNEE AMPUTATION (Left) Up with therapy  [name] 06/23/2017, 8:31 AM

## 2017-06-24 ENCOUNTER — Other Ambulatory Visit: Payer: Self-pay

## 2017-06-24 LAB — BASIC METABOLIC PANEL
ANION GAP: 10 (ref 5–15)
BUN: 22 mg/dL — ABNORMAL HIGH (ref 6–20)
CALCIUM: 8.8 mg/dL — AB (ref 8.9–10.3)
CO2: 25 mmol/L (ref 22–32)
CREATININE: 1.57 mg/dL — AB (ref 0.44–1.00)
Chloride: 99 mmol/L — ABNORMAL LOW (ref 101–111)
GFR, EST AFRICAN AMERICAN: 42 mL/min — AB (ref >60)
GFR, EST NON AFRICAN AMERICAN: 36 mL/min — AB (ref >60)
Glucose, Bld: 317 mg/dL — ABNORMAL HIGH (ref 65–99)
Potassium: 4.1 mmol/L (ref 3.5–5.1)
Sodium: 134 mmol/L — ABNORMAL LOW (ref 135–145)

## 2017-06-24 LAB — CBC
HCT: 23.9 % — ABNORMAL LOW (ref 36.0–46.0)
HEMOGLOBIN: 7.8 g/dL — AB (ref 12.0–15.0)
MCH: 27.4 pg (ref 26.0–34.0)
MCHC: 32.6 g/dL (ref 30.0–36.0)
MCV: 83.9 fL (ref 78.0–100.0)
PLATELETS: 252 10*3/uL (ref 150–400)
RBC: 2.85 MIL/uL — AB (ref 3.87–5.11)
RDW: 13.3 % (ref 11.5–15.5)
WBC: 5.8 10*3/uL (ref 4.0–10.5)

## 2017-06-24 LAB — GLUCOSE, CAPILLARY
GLUCOSE-CAPILLARY: 323 mg/dL — AB (ref 65–99)
GLUCOSE-CAPILLARY: 253 mg/dL — AB (ref 65–99)
Glucose-Capillary: 179 mg/dL — ABNORMAL HIGH (ref 65–99)

## 2017-06-24 MED ORDER — SODIUM CHLORIDE 0.9 % IV BOLUS (SEPSIS)
500.0000 mL | Freq: Once | INTRAVENOUS | Status: AC
  Administered 2017-06-24: 500 mL via INTRAVENOUS

## 2017-06-24 MED ORDER — SODIUM CHLORIDE 0.9 % IV SOLN: Freq: Once | INTRAVENOUS | Status: DC

## 2017-06-24 MED ORDER — INSULIN GLARGINE 100 UNIT/ML ~~LOC~~ SOLN
35.0000 [IU] | Freq: Two times a day (BID) | SUBCUTANEOUS | Status: DC
  Administered 2017-06-24 – 2017-06-26 (×4): 35 [IU] via SUBCUTANEOUS
  Filled 2017-06-24 (×4): qty 0.35

## 2017-06-24 NOTE — Progress Notes (Signed)
PROGRESS NOTE  [name] LDJ:[id number] DOB: 04/17/1961 DOA: 06/18/2017 PCP: Loyal Jacobson, MD   LOS: 6 days   Brief Narrative / Interim history: 56 year old female with past medical history significant for IDDM, poorly controlled, right BKA, chronic diastolic CHF, gastroparesis, and cerebrovascular disease who presents with left foot cellulitis.  Was evaluated in ER by Dr. Lajoyce Corners who recommends left BKA on Friday with pre-operative broad spectrum antibiotics.  Assessment & Plan: Active Problems:   Diabetes mellitus type 2, uncontrolled, with complications (HCC)   Cellulitis   Chronic diastolic heart failure (HCC)   Diabetic foot ulcer (HCC)   Hyperglycemia   CKD (chronic kidney disease), stage III (HCC)   GERD (gastroesophageal reflux disease)   Abnormal urinalysis   Diabetic gastroparesis (HCC)   History of TIA (transient ischemic attack)   Diabetic foot infection -Orthopedics consulted, appreciate recommendations, status post left BKA 11/2 -Preoperative vancomycin and cefepime, hold ACE inhibitor preop -PT to evaluate post op, recommending SNF -Placement pending, awaiting clearance from orthopedic surgery given wound VAC  Diabetes -Poorly controlled, BG 801 mg/dL admission, remains suboptimally controlled -increase Lantus to 35 twice daily today  AKI versus CKD -Looks like CKD stage III is her baseline, but baseline unclear, possibly around 1.3.  Improved overnight with fluids. -Creatinine slightly worse today to 1.5, patient reports decreased urine output,?  Dehydration -We will stop Lasix, small 500 cc bolus, monitor urine output as well as kidney function in the morning  Hypertension, chronic diastolic CHF, CV disease (TIA) secondary prevention: -Euvolemic, hypertensive.  Echocardiogram EF 60%, grade 1 DD -Continue carvedilol, hold Lasix as above -Continue to hold ACE inhibitor  Other medications -Continue Reglan -Continue Prozac  Anemia of chronic  disease/CKD/component of acute blood loss anemia postop -Hemoglobin slightly lower today at 7.8.,  Patient declines a blood transfusion, wants to avoid for now, and would like to wait and see if it stabilizes   DVT prophylaxis: heparin Code Status: Full code Family Communication: d/w mother at bedside Disposition Plan: SNF 2-3 days  Consultants:   Orthopedic surgery   Procedures:   None   Antimicrobials:  Vancomycin 10/29 >> 11/3  Cefepime 10/29 >>  11/3  Subjective: - no chest pain, shortness of breath, no abdominal pain, nausea or vomiting.  Complains of pain at the surgical site  Objective: Vitals:   06/23/17 2100 06/24/17 0522 06/24/17 0930 06/24/17 1357  BP: 132/62 (!) 144/59 (!) 151/57 (!) 148/59  Pulse: 69 73 70 70  Resp: 18 18  17   Temp: 99.5 F (37.5 C) 98.4 F (36.9 C)  98.2 F (36.8 C)  TempSrc: Oral Oral  Oral  SpO2: 92% 91%  96%  Weight:  97.5 kg (214 lb 15.2 oz)    Height:        Intake/Output Summary (Last 24 hours) at 06/24/2017 1457 Last data filed at 06/24/2017 0748 Gross per 24 hour  Intake 3502.5 ml  Output 875 ml  Net 2627.5 ml   Filed Weights   06/22/17 0457 06/23/17 0101 06/24/17 0522  Weight: 91.4 kg (201 lb 8 oz) 91 kg (200 lb 9.6 oz) 97.5 kg (214 lb 15.2 oz)    Examination:  Constitutional: NAD Eyes: No scleral icterus ENMT: Moist mucous membranes Respiratory: Clear to auscultation, no wheezing, no crackles, normal respiratory effort. Cardiovascular: Regular rate and rhythm, no murmurs.  No edema. Abdomen: No tenderness, bowel sounds positive Skin: No rashes.  Right BKA.  Left BKA with wound VAC in place.   Neurologic: No focal  findings   Data Reviewed: I have independently reviewed following labs and imaging studies   CBC: Recent Labs  Lab 06/19/17 0601 06/21/17 0703 06/22/17 0403 06/23/17 0623 06/24/17 0529  WBC 5.7 4.3 4.3 6.2 5.8  HGB 8.6* 9.9* 9.3* 8.7* 7.8*  HCT 26.3* 29.6* 27.8* 26.5* 23.9*  MCV 83.0 82.7  82.5 83.1 83.9  PLT 245 308 283 262 252   Basic Metabolic Panel: Recent Labs  Lab 06/18/17 1718  06/19/17 0601 06/21/17 0703 06/22/17 0403 06/23/17 0623 06/24/17 0529  NA  --    < > 132* 136 139 134* 134*  K  --    < > 4.7 4.0 4.6 3.9 4.1  CL  --    < > 99* 100* 103 101 99*  CO2  --    < > 25 26 27 23 25   GLUCOSE  --    < > 406* 284* 249* 278* 317*  BUN  --    < > 33* 19 22* 20 22*  CREATININE  --    < > 1.44* 1.16* 1.15* 1.35* 1.57*  CALCIUM  --    < > 8.9 9.2 9.4 8.8* 8.8*  MG 1.9  --   --   --   --   --   --   PHOS 2.7  --   --   --   --   --   --    < > = values in this interval not displayed.   GFR: Estimated Creatinine Clearance: 46.8 mL/min (A) (by C-G formula based on SCr of 1.57 mg/dL (H)). Liver Function Tests: Recent Labs  Lab 06/19/17 0601  AST 11*  ALT 11*  ALKPHOS 87  BILITOT 0.3  PROT 6.0*  ALBUMIN 2.2*   No results for input(s): LIPASE, AMYLASE in the last 168 hours. No results for input(s): AMMONIA in the last 168 hours. Coagulation Profile: Recent Labs  Lab 06/18/17 1718  INR 1.13   Cardiac Enzymes: No results for input(s): CKTOTAL, CKMB, CKMBINDEX, TROPONINI in the last 168 hours. BNP (last 3 results) No results for input(s): PROBNP in the last 8760 hours. HbA1C: No results for input(s): HGBA1C in the last 72 hours. CBG: Recent Labs  Lab 06/23/17 0742 06/23/17 1150 06/23/17 1645 06/23/17 2110 06/24/17 1149  GLUCAP 266* 306* 256* 298* 323*   Lipid Profile: No results for input(s): CHOL, HDL, LDLCALC, TRIG, CHOLHDL, LDLDIRECT in the last 72 hours. Thyroid Function Tests: No results for input(s): TSH, T4TOTAL, FREET4, T3FREE, THYROIDAB in the last 72 hours. Anemia Panel: No results for input(s): VITAMINB12, FOLATE, FERRITIN, TIBC, IRON, RETICCTPCT in the last 72 hours. Urine analysis:    Component Value Date/Time   COLORURINE YELLOW 06/18/2017 1310   APPEARANCEUR HAZY (A) 06/18/2017 1310   LABSPEC 1.018 06/18/2017 1310   PHURINE  5.0 06/18/2017 1310   GLUCOSEU >=500 (A) 06/18/2017 1310   HGBUR MODERATE (A) 06/18/2017 1310   HGBUR negative 03/25/2010 1147   BILIRUBINUR NEGATIVE 06/18/2017 1310   KETONESUR NEGATIVE 06/18/2017 1310   PROTEINUR 30 (A) 06/18/2017 1310   UROBILINOGEN 0.2 12/07/2014 2125   NITRITE NEGATIVE 06/18/2017 1310   LEUKOCYTESUR TRACE (A) 06/18/2017 1310   Sepsis Labs: Invalid input(s): PROCALCITONIN, LACTICIDVEN  Recent Results (from the past 240 hour(s))  Culture, blood (routine x 2)     Status: None   Collection Time: 06/18/17 11:50 AM  Result Value Ref Range Status   Specimen Description BLOOD LEFT HAND  Final   Special Requests   Final  BOTTLES DRAWN AEROBIC AND ANAEROBIC Blood Culture adequate volume   Culture NO GROWTH 5 DAYS  Final   Report Status 06/23/2017 FINAL  Final  Culture, blood (routine x 2)     Status: None   Collection Time: 06/18/17  1:00 PM  Result Value Ref Range Status   Specimen Description BLOOD LEFT WRIST  Final   Special Requests   Final    BOTTLES DRAWN AEROBIC AND ANAEROBIC Blood Culture adequate volume   Culture NO GROWTH 5 DAYS  Final   Report Status 06/23/2017 FINAL  Final  Culture, Urine     Status: None   Collection Time: 06/18/17  5:52 PM  Result Value Ref Range Status   Specimen Description URINE, CATHETERIZED  Final   Special Requests NONE  Final   Culture NO GROWTH  Final   Report Status 06/19/2017 FINAL  Final  MRSA PCR Screening     Status: None   Collection Time: 06/19/17  1:55 PM  Result Value Ref Range Status   MRSA by PCR NEGATIVE NEGATIVE Final    Comment:        The GeneXpert MRSA Assay (FDA approved for NASAL specimens only), is one component of a comprehensive MRSA colonization surveillance program. It is not intended to diagnose MRSA infection nor to guide or monitor treatment for MRSA infections.       Radiology Studies: No results found.   Scheduled Meds: . carvedilol  3.125 mg Oral BID WC  . clopidogrel  75 mg  Oral Daily  . docusate sodium  100 mg Oral BID  . FLUoxetine  40 mg Oral Daily  . heparin  5,000 Units Subcutaneous Q8H  . insulin aspart  0-20 Units Subcutaneous TID WC  . insulin aspart  0-5 Units Subcutaneous QHS  . insulin aspart  16 Units Subcutaneous TID WC  . insulin glargine  30 Units Subcutaneous BID  . levothyroxine  50 mcg Oral BH-q7a  . metoCLOPramide  10 mg Oral QID  . oxybutynin  10 mg Oral Daily  . rosuvastatin  40 mg Oral Daily   Continuous Infusions: . sodium chloride 75 mL/hr at 06/23/17 2041  . sodium chloride 10 mL/hr at 06/22/17 0930  . methocarbamol (ROBAXIN)  IV      Pamella Pert, MD, PhD [facility] Pager [phone]  If 7PM-7AM, please contact night-coverage [email or web address] Password [email or web address] 06/24/2017, 2:57 PM

## 2017-06-24 NOTE — Progress Notes (Signed)
Patient refused CPAP HS. Patient states she does not wear CPAP at home.

## 2017-06-24 NOTE — NC FL2 (Signed)
[place] MEDICAID FL2 LEVEL OF CARE SCREENING TOOL     IDENTIFICATION  Patient Name: [name] Birthdate: Nov 27, 1960 Sex: female Admission Date (Current Location): 06/18/2017  [facility] and [place] Number:  Producer, television/film/video and Address:  [facility], [address], [place], [place] [address]      Provider Number: [id number]  Attending Physician Name and Address:  Leatha Gilding, MD  Relative Name and Phone Number:       Current Level of Care: Hospital Recommended Level of Care: Skilled Nursing Facility Prior Approval Number:    Date Approved/Denied:   PASRR Number:   [id number]   Discharge Plan: SNF    Current Diagnoses: Patient Active Problem List   Diagnosis Date Noted  . Abnormal urinalysis 06/18/2017  . Diabetic gastroparesis (HCC) 06/18/2017  . History of TIA (transient ischemic attack) 06/18/2017  . Cutaneous abscess of left foot   . Hyperlipidemia 04/10/2017  . Acquired absence of right leg below knee (HCC) 11/29/2016  . Non-pressure chronic ulcer of other part of left foot limited to breakdown of skin (HCC) 10/10/2016  . Left facial numbness   . Cerebrovascular disease   . Diabetic polyneuropathy associated with type 2 diabetes mellitus (HCC)   . Chronic nonintractable headache   . GERD (gastroesophageal reflux disease) 07/21/2016  . TIA (transient ischemic attack) 07/04/2016  . Hyperkalemia 12/07/2015  . Cellulitis in diabetic foot (HCC) 12/05/2015  . Herpes simplex labialis   . Altered mental status   . Thrush 10/20/2015  . HSV-1 (herpes simplex virus 1) infection   . Hallucinations   . Acute respiratory failure with hypoxia (HCC) 10/14/2015  . Hyperglycemia 10/13/2015  . Diabetes mellitus due to underlying condition with hyperosmolarity without nonketotic hyperglycemic-hyperosmolar coma [facility]) (HCC) 10/13/2015  . CKD (chronic kidney disease), stage III (HCC) 10/13/2015  . AKI (acute kidney injury) (HCC)  10/13/2015  . Diastolic CHF (HCC) 10/13/2015  . Acute encephalopathy 10/13/2015  . 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 Lactic acid acidosis   . Diabetic foot ulcer (HCC) 12/08/2014  . Symptomatic bradycardia 03/04/2014  . OSA on CPAP with oxygen 03/04/2014  . COPD (chronic obstructive pulmonary disease), on home O2 03/04/2014  . Bradycardia 03/04/2014  . Open wound of knee, leg (except thigh), and ankle, complicated 09/17/2013  . Cellulitis 08/16/2013  . Chronic diastolic heart failure (HCC) 08/16/2013  . Sepsis (HCC) 08/16/2013  . SOB (shortness of breath) 04/20/2013  . Proteinuria 01/26/2013  . Acute on chronic kidney failure (HCC) 01/24/2013  . Abscess of groin, right 01/23/2013  . HTN (hypertension) 01/23/2013  . Leukocytosis 01/23/2013  . Anemia of chronic disease 01/23/2013  . Hypothyroidism 01/23/2013  . Hyponatremia 10/06/2011  . HYPERCHOLESTEROLEMIA 08/17/2010  . Depression 02/01/2010  . TOBACCO ABUSE 06/04/2009  . Diabetes mellitus type 2, uncontrolled, with complications (HCC) 08/06/2007    Orientation RESPIRATION BLADDER Height & Weight     Self, Time, Situation, Place  Normal Continent Weight: 214 lb 15.2 oz (97.5 kg) Height:  5\' 5"  (165.1 cm)  BEHAVIORAL SYMPTOMS/MOOD NEUROLOGICAL BOWEL NUTRITION STATUS      Continent Diet(Diet Carb Modified )  AMBULATORY STATUS COMMUNICATION OF NEEDS Skin   Extensive Assist Verbally Normal(closed incision- left leg)                       Personal Care Assistance Level of Assistance  Bathing, Feeding, Dressing Bathing Assistance: Limited assistance Feeding assistance: Independent Dressing Assistance: Limited assistance  Functional Limitations Info             SPECIAL CARE FACTORS FREQUENCY  PT (By licensed PT), OT (By licensed OT)     PT Frequency: 5 OT Frequency: 5            Contractures      Additional Factors Info  Code Status, Allergies Code Status Info: Full code   Allergies Info: PENICILLINS, ERYTHROMYCIN, GADOLINIUM, IODINE-131, IVP DYE IODINATED DIAGNOSTIC AGENTS, METFORMIN Psychotropic Info: Prozac Insulin Sliding Scale Info: 3x daily with meals and at bedtime       Current Medications (06/24/2017):  This is the current hospital active medication list Current Facility-Administered Medications  Medication Dose Route Frequency Provider Last Rate Last Dose  . 0.9 %  sodium chloride infusion   Intravenous Continuous Ozella Rocks, MD 75 mL/hr at 06/23/17 2041    . 0.9 %  sodium chloride infusion   Intravenous Continuous Nadara Mustard, MD 10 mL/hr at 06/22/17 0930    . acetaminophen (TYLENOL) tablet 650 mg  650 mg Oral Q4H PRN Nadara Mustard, MD       Or  . acetaminophen (TYLENOL) suppository 650 mg  650 mg Rectal Q4H PRN Nadara Mustard, MD      . bisacodyl (DULCOLAX) suppository 10 mg  10 mg Rectal Daily PRN Nadara Mustard, MD      . carvedilol (COREG) tablet 3.125 mg  3.125 mg Oral BID WC Ozella Rocks, MD   3.125 mg at 06/24/17 0932  . clopidogrel (PLAVIX) tablet 75 mg  75 mg Oral Daily Ozella Rocks, MD   75 mg at 06/24/17 5597  . docusate sodium (COLACE) capsule 100 mg  100 mg Oral BID Nadara Mustard, MD   100 mg at 06/24/17 4163  . FLUoxetine (PROZAC) capsule 40 mg  40 mg Oral Daily Ozella Rocks, MD   40 mg at 06/24/17 0926  . heparin injection 5,000 Units  5,000 Units Subcutaneous Q8H Ozella Rocks, MD   5,000 Units at 06/24/17 [phone]  . hydrALAZINE (APRESOLINE) injection 5-10 mg  5-10 mg Intravenous Q4H PRN Ozella Rocks, MD   5 mg at 06/20/17 6468  . HYDROmorphone (DILAUDID) injection 1 mg  1 mg Intravenous Q2H PRN Nadara Mustard, MD   1 mg at 06/24/17 0321  . insulin aspart (novoLOG) injection 0-20 Units  0-20 Units Subcutaneous TID WC Ozella Rocks, MD   15 Units at 06/24/17 [phone]  . insulin aspart (novoLOG) injection 0-5 Units  0-5 Units Subcutaneous QHS Ozella Rocks, MD   3 Units at 06/23/17 2127  . insulin aspart  (novoLOG) injection 16 Units  16 Units Subcutaneous TID WC Leatha Gilding, MD   16 Units at 06/24/17 0944  . insulin glargine (LANTUS) injection 30 Units  30 Units Subcutaneous BID Leatha Gilding, MD   30 Units at 06/24/17 2500  . levothyroxine (SYNTHROID, LEVOTHROID) tablet 50 mcg  50 mcg Oral Gwynneth Munson, MD   50 mcg at 06/24/17 [phone]  . magnesium citrate solution 1 Bottle  1 Bottle Oral Once PRN Nadara Mustard, MD      . methocarbamol (ROBAXIN) tablet 500 mg  500 mg Oral Q6H PRN Nadara Mustard, MD   500 mg at 06/22/17 0855   Or  . methocarbamol (ROBAXIN) 500 mg in dextrose 5 % 50 mL IVPB  500 mg Intravenous Q6H PRN Nadara Mustard, MD      .  metoCLOPramide (REGLAN) tablet 5-10 mg  5-10 mg Oral Q8H PRN Nadara Mustard, MD       Or  . metoCLOPramide (REGLAN) injection 5-10 mg  5-10 mg Intravenous Q8H PRN Nadara Mustard, MD      . metoCLOPramide (REGLAN) tablet 10 mg  10 mg Oral QID Ozella Rocks, MD   10 mg at 06/24/17 0737  . ondansetron (ZOFRAN) tablet 4 mg  4 mg Oral Q6H PRN Nadara Mustard, MD       Or  . ondansetron [facility]) injection 4 mg  4 mg Intravenous Q6H PRN Nadara Mustard, MD      . oxybutynin (DITROPAN-XL) 24 hr tablet 10 mg  10 mg Oral Daily Ozella Rocks, MD   10 mg at 06/24/17 1062  . oxyCODONE (Oxy IR/ROXICODONE) immediate release tablet 5 mg  5 mg Oral Q4H PRN Ozella Rocks, MD   5 mg at 06/24/17 1129  . polyethylene glycol (MIRALAX / GLYCOLAX) packet 17 g  17 g Oral Daily PRN Nadara Mustard, MD      . rosuvastatin (CRESTOR) tablet 40 mg  40 mg Oral Daily Ozella Rocks, MD   40 mg at 06/24/17 0924  . sodium chloride 0.9 % bolus 500 mL  500 mL Intravenous Once Leatha Gilding, MD 125 mL/hr at 06/24/17 0956 500 mL at 06/24/17 6948     Discharge Medications: Please see discharge summary for a list of discharge medications.  Relevant Imaging Results:  Relevant Lab Results:   Additional Information SS#: [id number]  Donnie Coffin,  LCSW

## 2017-06-25 LAB — BASIC METABOLIC PANEL
Anion gap: 7 (ref 5–15)
BUN: 21 mg/dL — ABNORMAL HIGH (ref 6–20)
CHLORIDE: 104 mmol/L (ref 101–111)
CO2: 25 mmol/L (ref 22–32)
CREATININE: 1.34 mg/dL — AB (ref 0.44–1.00)
Calcium: 8.8 mg/dL — ABNORMAL LOW (ref 8.9–10.3)
GFR calc non Af Amer: 44 mL/min — ABNORMAL LOW (ref >60)
GFR, EST AFRICAN AMERICAN: 51 mL/min — AB (ref >60)
Glucose, Bld: 317 mg/dL — ABNORMAL HIGH (ref 65–99)
Potassium: 4.4 mmol/L (ref 3.5–5.1)
Sodium: 136 mmol/L (ref 135–145)

## 2017-06-25 LAB — CBC
HEMATOCRIT: 21 % — AB (ref 36.0–46.0)
Hemoglobin: 6.8 g/dL — CL (ref 12.0–15.0)
MCH: 27.5 pg (ref 26.0–34.0)
MCHC: 32.4 g/dL (ref 30.0–36.0)
MCV: 85 fL (ref 78.0–100.0)
PLATELETS: 255 10*3/uL (ref 150–400)
RBC: 2.47 MIL/uL — AB (ref 3.87–5.11)
RDW: 13.6 % (ref 11.5–15.5)
WBC: 6.3 10*3/uL (ref 4.0–10.5)

## 2017-06-25 LAB — ABO/RH: ABO/RH(D): O POS

## 2017-06-25 LAB — GLUCOSE, CAPILLARY
GLUCOSE-CAPILLARY: 286 mg/dL — AB (ref 65–99)
GLUCOSE-CAPILLARY: 274 mg/dL — AB (ref 65–99)
Glucose-Capillary: 318 mg/dL — ABNORMAL HIGH (ref 65–99)
Glucose-Capillary: 304 mg/dL — ABNORMAL HIGH (ref 65–99)
Glucose-Capillary: 345 mg/dL — ABNORMAL HIGH (ref 65–99)
Glucose-Capillary: 277 mg/dL — ABNORMAL HIGH (ref 65–99)

## 2017-06-25 LAB — PREPARE RBC (CROSSMATCH)

## 2017-06-25 MED ORDER — SENNOSIDES-DOCUSATE SODIUM 8.6-50 MG PO TABS
2.0000 | ORAL_TABLET | Freq: Two times a day (BID) | ORAL | Status: DC
  Administered 2017-06-25 – 2017-06-26 (×3): 2 via ORAL
  Filled 2017-06-25 (×3): qty 2

## 2017-06-25 MED ORDER — SODIUM CHLORIDE 0.9 % IV SOLN
Freq: Once | INTRAVENOUS | Status: AC
  Administered 2017-06-25: 14:00:00 via INTRAVENOUS

## 2017-06-25 NOTE — Progress Notes (Signed)
PT Cancellation Note  Patient Details Name: [name] MRN: [id number] DOB: [birth year]   Cancelled Treatment:    Reason Eval/Treat Not Completed: Medical issues which prohibited therapy. Pt Hgb 6.8 this AM. Transfusion just started. Will check back if time allows.   Kallie Locks, PTA Pager [phone] Acute Rehab   Sheral Apley 06/25/2017, 11:39 AM

## 2017-06-25 NOTE — Progress Notes (Signed)
Patient ID: [name], female   DOB: 24-Mar-1961, 56 y.o.   MRN: [id number] Examination patient's left transtibial amputation is stable there is no drainage no cellulitis.  The wound VAC canister has no fluid.  Anticipate discharge to skilled nursing.  Will have the wound VAC removed prior to discharge.

## 2017-06-25 NOTE — Care Management Note (Addendum)
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          Eric Form (Brother) Elder Cyphers (Mother)    [phone]     PCP: Loyal Jacobson  Action/Plan: CM received consult for home health needs, equipment , medication needs. PT/OT  Evaluations pending .... CM to f/u with disposition needs.  Expected Discharge Date:                  Expected Discharge Plan:  Skilled Nursing Facility  In-House Referral:  Clinical Social Work  Discharge planning Services  CM Consult  Post Acute Care Choice:  NA Choice offered to:  NA  DME Arranged:    DME Agency:     HH Arranged:    HH Agency:     Status of Service:  In process, will continue to follow  If discussed at Long Length of Stay Meetings, dates discussed:    Discharge Disposition: skilled facility   Additional Comments:  06/25/17- 1445- Donn Pierini RN, CM- received fax from pt's [facility] provider regarding Transitions of Care Plan review- for [employer] [facility] [employer] services- spoke with pt at bedside- pt does confirm that she has this service with her insurance provider- plan is for d/c to [facility]- provided pt with paperwork for Transitions of Care Plan that she needs to discuss with her doctor and sign then fax back to [facility] for the [facility]. Pt to read fax- and call # provided on fax if she has any questions- CM attempted to call Mellody Drown at [facility] regarding fax- however Herbert Seta out of office- contact- [phone]  Care plan ID - [id number]  Darrold Span, RN 06/25/2017, 2:55 PM [phone]- 5W coverage

## 2017-06-25 NOTE — Care Management Important Message (Signed)
Important Message  Patient Details  Name: [name] MRN: [id number] Date of Birth: 1961-04-01   Medicare Important Message Given:  Yes    Sherby Moncayo Abena 06/25/2017, 10:40 AM

## 2017-06-25 NOTE — Progress Notes (Signed)
CRITICAL VALUE ALERT  Critical Value:  6.8 HGB  Date & Time Notied:  11/5 0940 Provider Notified: DR Elvera Lennox  Orders Received/Actions taken: AWAITING ORDERS

## 2017-06-25 NOTE — Progress Notes (Signed)
PROGRESS NOTE  [name] CZY:[id number] DOB: 03/17/1961 DOA: 06/18/2017 PCP: Loyal Jacobson, MD   LOS: 7 days   Brief Narrative / Interim history: 56 year old female with past medical history significant for IDDM, poorly controlled, right BKA, chronic diastolic CHF, gastroparesis, and cerebrovascular disease who presents with left foot cellulitis.  Was evaluated in ER by Dr. Lajoyce Corners who recommends left BKA on Friday with pre-operative broad spectrum antibiotics.  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  Improved overnight with fluids. -Creatinine slightly worse today to 1.5, patient reports decreased urine output,?  Dehydration -Stopped Lasix on 11/4, small 500 cc bolus -Creatinine improved today to 1.34, continue to hold Lasix and may need to use as needed on discharge  Hypertension, chronic diastolic CHF, CV disease (TIA) secondary prevention: -Euvolemic, hypertensive.  Echocardiogram EF 60%, grade 1 DD -Continue carvedilol, hold Lasix as above -Continue to hold ACE inhibitor  Other medications -Continue Reglan -Continue Prozac  Anemia of chronic  disease/CKD/component of acute blood loss anemia postop -Hemoglobin down to 6.8 this morning.  Initially resistant to transfusions however now agreeable.  We will give 2 units of packed red blood cells.   DVT prophylaxis: heparin Code Status: Full code Family Communication: d/w mother at bedside Disposition Plan: SNF 2-3 days  Consultants:   Orthopedic surgery   Procedures:   None   Antimicrobials:  Vancomycin 10/29 >> 11/3  Cefepime 10/29 >>  11/3  Subjective: - no chest pain, shortness of breath, no abdominal pain, nausea or vomiting.   Objective: Vitals:   06/25/17 0917 06/25/17 1021 06/25/17 1211 06/25/17 1409  BP: (!) 140/48 (!) 129/50 (!) 110/40 (!) 153/63  Pulse: 72 64  (!) 112  Resp: 14   14  Temp: 98.8 F (37.1 C) 99.1 F (37.3 C)  98.2 F (36.8 C)  TempSrc: Oral Oral  Oral  SpO2: 94% 96%  95%  Weight:      Height:        Intake/Output Summary (Last 24 hours) at 06/25/2017 1414 Last data filed at 06/25/2017 1115 Gross per 24 hour  Intake 2059.67 ml  Output -  Net 2059.67 ml   Filed Weights   06/23/17 0101 06/24/17 0522 06/25/17 0610  Weight: 91 kg (200 lb 9.6 oz) 97.5 kg (214 lb 15.2 oz) 94.3 kg (208 lb)    Examination:  Constitutional: NAD, calm, comfortable Eyes: lids and conjunctivae normal ENMT: Mucous membranes are moist.  Respiratory: clear to auscultation bilaterally, no wheezing, no crackles.  Cardiovascular: Regular rate and rhythm, no murmurs / rubs / gallops. No LE edema. Abdomen: no tenderness. Bowel sounds positive.  Neurologic: non focal   Data Reviewed: I have independently reviewed following labs and imaging studies   CBC: Recent Labs  Lab  06/21/17 0703 06/22/17 0403 06/23/17 0623 06/24/17 0529 06/25/17 0654  WBC 4.3 4.3 6.2 5.8 6.3  HGB 9.9* 9.3* 8.7* 7.8* 6.8*  HCT 29.6* 27.8* 26.5* 23.9* 21.0*  MCV 82.7 82.5 83.1 83.9 85.0  PLT 308 283 262 252 255   Basic Metabolic Panel: Recent Labs  Lab 06/18/17 1718   06/21/17 0703 06/22/17 0403 06/23/17 0623 06/24/17 0529 06/25/17 0654  NA  --    < > 136 139 134* 134* 136  K  --    < > 4.0 4.6 3.9 4.1 4.4  CL  --    < > 100* 103 101 99* 104  CO2  --    < > 26 27 23 25 25   GLUCOSE  --    < > 284* 249* 278* 317* 317*  BUN  --    < > 19 22* 20 22* 21*  CREATININE  --    < > 1.16* 1.15* 1.35* 1.57* 1.34*  CALCIUM  --    < > 9.2 9.4 8.8* 8.8* 8.8*  MG 1.9  --   --   --   --   --   --   PHOS 2.7  --   --   --   --   --   --    < > = values in this interval not displayed.   GFR: Estimated Creatinine Clearance: 53.8 mL/min (A) (by C-G formula based on SCr of 1.34 mg/dL (H)). Liver Function Tests: Recent Labs  Lab 06/19/17 0601  AST 11*  ALT 11*  ALKPHOS 87  BILITOT 0.3  PROT 6.0*  ALBUMIN 2.2*   No results for input(s): LIPASE, AMYLASE in the last 168 hours. No results for input(s): AMMONIA in the last 168 hours. Coagulation Profile: Recent Labs  Lab 06/18/17 1718  INR 1.13   Cardiac Enzymes: No results for input(s): CKTOTAL, CKMB, CKMBINDEX, TROPONINI in the last 168 hours. BNP (last 3 results) No results for input(s): PROBNP in the last 8760 hours. HbA1C: No results for input(s): HGBA1C in the last 72 hours. CBG: Recent Labs  Lab 06/24/17 1729 06/24/17 2156 06/25/17 0743 06/25/17 1036 06/25/17 1217  GLUCAP 179* 253* 304* 286* 274*   Lipid Profile: No results for input(s): CHOL, HDL, LDLCALC, TRIG, CHOLHDL, LDLDIRECT in the last 72 hours. Thyroid Function Tests: No results for input(s): TSH, T4TOTAL, FREET4, T3FREE, THYROIDAB in the last 72 hours. Anemia Panel: No results for input(s): VITAMINB12, FOLATE, FERRITIN, TIBC, IRON, RETICCTPCT in the last 72 hours. Urine analysis:    Component Value Date/Time   COLORURINE YELLOW 06/18/2017 1310   APPEARANCEUR HAZY (A) 06/18/2017 1310   LABSPEC 1.018 06/18/2017 1310   PHURINE 5.0 06/18/2017 1310   GLUCOSEU >=500 (A) 06/18/2017 1310   HGBUR MODERATE (A) 06/18/2017 1310    HGBUR negative 03/25/2010 1147   BILIRUBINUR NEGATIVE 06/18/2017 1310   KETONESUR NEGATIVE 06/18/2017 1310   PROTEINUR 30 (A) 06/18/2017 1310   UROBILINOGEN 0.2 12/07/2014 2125   NITRITE NEGATIVE 06/18/2017 1310   LEUKOCYTESUR TRACE (A) 06/18/2017 1310   Sepsis Labs: Invalid input(s): PROCALCITONIN, LACTICIDVEN  Recent Results (from the past 240 hour(s))  Culture, blood (routine x 2)     Status: None   Collection Time: 06/18/17 11:50 AM  Result Value Ref Range Status   Specimen Description BLOOD LEFT HAND  Final   Special Requests   Final    BOTTLES DRAWN AEROBIC AND ANAEROBIC Blood Culture adequate volume   Culture NO GROWTH 5 DAYS  Final  Report Status 06/23/2017 FINAL  Final  Culture, blood (routine x 2)     Status: None   Collection Time: 06/18/17  1:00 PM  Result Value Ref Range Status   Specimen Description BLOOD LEFT WRIST  Final   Special Requests   Final    BOTTLES DRAWN AEROBIC AND ANAEROBIC Blood Culture adequate volume   Culture NO GROWTH 5 DAYS  Final   Report Status 06/23/2017 FINAL  Final  Culture, Urine     Status: None   Collection Time: 06/18/17  5:52 PM  Result Value Ref Range Status   Specimen Description URINE, CATHETERIZED  Final   Special Requests NONE  Final   Culture NO GROWTH  Final   Report Status 06/19/2017 FINAL  Final  MRSA PCR Screening     Status: None   Collection Time: 06/19/17  1:55 PM  Result Value Ref Range Status   MRSA by PCR NEGATIVE NEGATIVE Final    Comment:        The GeneXpert MRSA Assay (FDA approved for NASAL specimens only), is one component of a comprehensive MRSA colonization surveillance program. It is not intended to diagnose MRSA infection nor to guide or monitor treatment for MRSA infections.       Radiology Studies: No results found.   Scheduled Meds: . carvedilol  3.125 mg Oral BID WC  . clopidogrel  75 mg Oral Daily  . docusate sodium  100 mg Oral BID  . FLUoxetine  40 mg Oral Daily  . heparin   5,000 Units Subcutaneous Q8H  . insulin aspart  0-20 Units Subcutaneous TID WC  . insulin aspart  0-5 Units Subcutaneous QHS  . insulin aspart  16 Units Subcutaneous TID WC  . insulin glargine  35 Units Subcutaneous BID  . levothyroxine  50 mcg Oral BH-q7a  . metoCLOPramide  10 mg Oral QID  . oxybutynin  10 mg Oral Daily  . rosuvastatin  40 mg Oral Daily  . senna-docusate  2 tablet Oral BID   Continuous Infusions: . sodium chloride 75 mL/hr at 06/23/17 2041  . sodium chloride Stopped (06/24/17 1900)  . sodium chloride    . methocarbamol (ROBAXIN)  IV      Pamella Pert, MD, PhD [facility] Pager [phone]  If 7PM-7AM, please contact night-coverage [email or web address] Password [email or web address] 06/25/2017, 2:14 PM

## 2017-06-25 NOTE — Progress Notes (Signed)
Inpatient Diabetes Program Recommendations  AACE/ADA: New Consensus Statement on Inpatient Glycemic Control (2015)  Target Ranges:  Prepandial:   less than 140 mg/dL      Peak postprandial:   less than 180 mg/dL (1-2 hours)      Critically ill patients:  140 - 180 mg/dL   Lab Results  Component Value Date   GLUCAP 304 (H) 06/25/2017   HGBA1C >15.5 (H) 06/18/2017    Review of Glycemic Control Results for [name], [name] (MRN [id number]) as of 06/25/2017 10:39  Ref. Range 06/24/2017 11:49 06/24/2017 17:29 06/24/2017 21:56 06/25/2017 07:43 06/25/2017 10:36  Glucose-Capillary Latest Ref Range: 65 - 99 mg/dL 557 (H) 322 (H) 025 (H) 304 (H) 286 (H)   Diabetes history:DM2 Outpatient Diabetes medications: Humulin R U500 (Concentrated insulin) 60 units with breakfast, Humulin R U500 50 units with lunch, Humulin R U500 30 units at bedtime Current orders for Inpatient glycemic control: Lantus 35 units bid, Novolog 16 units TID with meals, Novolog 0-20 units TID with meals, Novolog 0-5 units QHS  Inpatient Diabetes Program Recommendations:  Noted patient received total Novolog yesterday of 85 units (48 units meal coverage + 37 units correction. Please consider -D/C of Lantus and Novolog meal coverage -Add Humalog U 500 60 units bid -Continue Novolog correction as ordered  Thank you, Darel Hong E. Starlit Raburn, RN, MSN, CDE  Diabetes Coordinator Inpatient Glycemic Control Team Team Pager [phone] (8am-5pm) 06/25/2017 10:37 AM

## 2017-06-26 LAB — BASIC METABOLIC PANEL
Anion gap: 8 (ref 5–15)
BUN: 19 mg/dL (ref 6–20)
CHLORIDE: 104 mmol/L (ref 101–111)
CO2: 24 mmol/L (ref 22–32)
Calcium: 9 mg/dL (ref 8.9–10.3)
Creatinine, Ser: 1.34 mg/dL — ABNORMAL HIGH (ref 0.44–1.00)
GFR calc Af Amer: 51 mL/min — ABNORMAL LOW (ref >60)
GFR calc non Af Amer: 44 mL/min — ABNORMAL LOW (ref >60)
Glucose, Bld: 282 mg/dL — ABNORMAL HIGH (ref 65–99)
POTASSIUM: 4.2 mmol/L (ref 3.5–5.1)
SODIUM: 136 mmol/L (ref 135–145)

## 2017-06-26 LAB — GLUCOSE, CAPILLARY
GLUCOSE-CAPILLARY: 274 mg/dL — AB (ref 65–99)
GLUCOSE-CAPILLARY: 258 mg/dL — AB (ref 65–99)

## 2017-06-26 LAB — BPAM RBC
Blood Product Expiration Date: 201811292359
Blood Product Expiration Date: 201811292359
ISSUE DATE / TIME: 201811051407
ISSUE DATE / TIME: 201811051724
UNIT TYPE AND RH: 5100
Unit Type and Rh: 5100

## 2017-06-26 LAB — TYPE AND SCREEN
ABO/RH(D): O POS
ANTIBODY SCREEN: NEGATIVE
UNIT DIVISION: 0
Unit division: 0

## 2017-06-26 LAB — CBC
HCT: 28.2 % — ABNORMAL LOW (ref 36.0–46.0)
HEMOGLOBIN: 9.4 g/dL — AB (ref 12.0–15.0)
MCH: 28 pg (ref 26.0–34.0)
MCHC: 33.3 g/dL (ref 30.0–36.0)
MCV: 83.9 fL (ref 78.0–100.0)
Platelets: 257 10*3/uL (ref 150–400)
RBC: 3.36 MIL/uL — AB (ref 3.87–5.11)
RDW: 14.2 % (ref 11.5–15.5)
WBC: 6.5 10*3/uL (ref 4.0–10.5)

## 2017-06-26 MED ORDER — INSULIN ASPART 100 UNIT/ML ~~LOC~~ SOLN: 15.0000 [IU] | Freq: Three times a day (TID) | SUBCUTANEOUS | 11 refills | Status: DC

## 2017-06-26 MED ORDER — INSULIN ASPART 100 UNIT/ML ~~LOC~~ SOLN
20.0000 [IU] | Freq: Three times a day (TID) | SUBCUTANEOUS | Status: DC
  Administered 2017-06-26: 20 [IU] via SUBCUTANEOUS

## 2017-06-26 MED ORDER — OXYCODONE HCL 5 MG PO TABS: 5.0000 mg | ORAL_TABLET | Freq: Four times a day (QID) | ORAL | 0 refills | Status: DC | PRN

## 2017-06-26 MED ORDER — INSULIN GLARGINE 100 UNIT/ML ~~LOC~~ SOLN: 35.0000 [IU] | Freq: Two times a day (BID) | SUBCUTANEOUS | 11 refills | Status: DC

## 2017-06-26 MED ORDER — FUROSEMIDE 40 MG PO TABS: 20.0000 mg | ORAL_TABLET | Freq: Every day | ORAL | 0 refills | Status: DC | PRN

## 2017-06-26 MED ORDER — INSULIN GLARGINE 100 UNIT/ML ~~LOC~~ SOLN
40.0000 [IU] | Freq: Two times a day (BID) | SUBCUTANEOUS | Status: DC
  Filled 2017-06-26: qty 0.4

## 2017-06-26 NOTE — Progress Notes (Deleted)
PROGRESS NOTE  MELAYAH SKORUPSKI ZOX:[id number] DOB: [birth year] DOA: 06/18/2017 PCP: Loyal Jacobson, MD   LOS: 8 days   Brief Narrative / Interim history: 56 year old female with past medical history significant for IDDM, poorly controlled, right BKA, chronic diastolic CHF, gastroparesis, and cerebrovascular disease who presents with left foot cellulitis.  Was evaluated in ER by Dr. Lajoyce Corners who recommends left BKA on Friday with pre-operative broad spectrum antibiotics.  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 Lajoyce Corners, wound VAC is to be discontinued on patient's discharge  Diabetes -Poorly controlled, BG 801 mg/dL admission, remains suboptimally controlled -increase again insulin regimen today to 40 units of Lantus twice daily as well as 20 units of NovoLog with each meal plus sliding scale  AKI versus CKD -Looks like CKD stage III is her baseline, but baseline unclear, possibly around 1.3.  -Creatinine has been overall stable, increased at one point to 1.5, and her Lasix has been discontinued and she responded well to a small fluid bolus.  Received blood transfusion yesterday for blood loss anemia and her creatinine remained stable today at 1.34.  Hypertension, chronic diastolic CHF, CV disease (TIA) secondary prevention: -Euvolemic, hypertensive.   Echocardiogram EF 60%, grade 1 DD -Continue carvedilol -Continue to hold ACE inhibitor, but if the kidney function remains stable she may benefit on discharge given underlying diabetes -She was on Lasix, appears euvolemic, on hold for now.  She may need Lasix as needed on discharge  Other medications -Continue Reglan -Continue Prozac  Anemia of chronic disease/CKD/component of acute blood loss anemia postop -Hemoglobin down to 6.8 11/5.  Initially resistant to transfusions and refused transfusing on 11/4 however now agreeable. -She received 2 units of packed red blood cells on 11/5 and her hemoglobin has improved to 9.4 today.  Repeat CBC tomorrow morning   DVT prophylaxis: heparin Code Status: Full code Family Communication: No family at bedside Disposition Plan: SNF when bed available  Consultants:   Orthopedic surgery   Procedures:   None   Antimicrobials:  Vancomycin 10/29 >> 11/3  Cefepime 10/29 >>  11/3  Subjective: -Feels well, no complaints.  No chest pain, no shortness of breath.  Good appetite, no nausea or vomiting.  Objective: Vitals:   06/26/17 0500 06/26/17 0621 06/26/17 0858 06/26/17 1336  BP:  (!) 152/50 (!) 172/58 (!) 147/63  Pulse:  65 66 68  Resp:  16  16  Temp:  98.8 F (37.1 C)  98.3 F (36.8 C)  TempSrc:  Oral  Oral  SpO2:  92%  95%  Weight: 95.5 kg (210 lb 9.6 oz)     Height:        Intake/Output Summary (Last 24 hours) at 06/26/2017 1420 Last data filed at 06/25/2017 2123 Gross per 24 hour  Intake 362 ml  Output 250 ml  Net 112 ml   Filed Weights   06/25/17 0610 06/25/17 2126 06/26/17 0500  Weight: 94.3 kg (  208 lb) 54.6 kg (120 lb 6.4 oz) 95.5 kg (210 lb 9.6 oz)    Examination:  Constitutional: NAD Eyes: no scleral icterus ENMT: Moist mucous membranes Respiratory: Moves air well, clear to auscultation bilaterally, no wheezing, no crackles Cardiovascular: Regular rate and rhythm, no murmurs rubs or gallops.  No edema  noted Abdomen: Soft, nontender, nondistended.  Bowel sounds positive Neurologic: Nonfocal   Data Reviewed: I have independently reviewed following labs and imaging studies   CBC: Recent Labs  Lab 06/22/17 0403 06/23/17 0623 06/24/17 0529 06/25/17 0654 06/26/17 0531  WBC 4.3 6.2 5.8 6.3 6.5  HGB 9.3* 8.7* 7.8* 6.8* 9.4*  HCT 27.8* 26.5* 23.9* 21.0* 28.2*  MCV 82.5 83.1 83.9 85.0 83.9  PLT 283 262 252 255 257   Basic Metabolic Panel: Recent Labs  Lab 06/22/17 0403 06/23/17 0623 06/24/17 0529 06/25/17 0654 06/26/17 0531  NA 139 134* 134* 136 136  K 4.6 3.9 4.1 4.4 4.2  CL 103 101 99* 104 104  CO2 27 23 25 25 24   GLUCOSE 249* 278* 317* 317* 282*  BUN 22* 20 22* 21* 19  CREATININE 1.15* 1.35* 1.57* 1.34* 1.34*  CALCIUM 9.4 8.8* 8.8* 8.8* 9.0   GFR: Estimated Creatinine Clearance: 54.2 mL/min (A) (by C-G formula based on SCr of 1.34 mg/dL (H)). Liver Function Tests: No results for input(s): AST, ALT, ALKPHOS, BILITOT, PROT, ALBUMIN in the last 168 hours. No results for input(s): LIPASE, AMYLASE in the last 168 hours. No results for input(s): AMMONIA in the last 168 hours. Coagulation Profile: No results for input(s): INR, PROTIME in the last 168 hours. Cardiac Enzymes: No results for input(s): CKTOTAL, CKMB, CKMBINDEX, TROPONINI in the last 168 hours. BNP (last 3 results) No results for input(s): PROBNP in the last 8760 hours. HbA1C: No results for input(s): HGBA1C in the last 72 hours. CBG: Recent Labs  Lab 06/25/17 1217 06/25/17 1718 06/25/17 2149 06/26/17 0802 06/26/17 1204  GLUCAP 274* 345* 277* 274* 258*   Lipid Profile: No results for input(s): CHOL, HDL, LDLCALC, TRIG, CHOLHDL, LDLDIRECT in the last 72 hours. Thyroid Function Tests: No results for input(s): TSH, T4TOTAL, FREET4, T3FREE, THYROIDAB in the last 72 hours. Anemia Panel: No results for input(s): VITAMINB12, FOLATE, FERRITIN, TIBC, IRON, RETICCTPCT in the last 72 hours. Urine analysis:     Component Value Date/Time   COLORURINE YELLOW 06/18/2017 1310   APPEARANCEUR HAZY (A) 06/18/2017 1310   LABSPEC 1.018 06/18/2017 1310   PHURINE 5.0 06/18/2017 1310   GLUCOSEU >=500 (A) 06/18/2017 1310   HGBUR MODERATE (A) 06/18/2017 1310   HGBUR negative 03/25/2010 1147   BILIRUBINUR NEGATIVE 06/18/2017 1310   KETONESUR NEGATIVE 06/18/2017 1310   PROTEINUR 30 (A) 06/18/2017 1310   UROBILINOGEN 0.2 12/07/2014 2125   NITRITE NEGATIVE 06/18/2017 1310   LEUKOCYTESUR TRACE (A) 06/18/2017 1310   Sepsis Labs: Invalid input(s): PROCALCITONIN, LACTICIDVEN  Recent Results (from the past 240 hour(s))  Culture, blood (routine x 2)     Status: None   Collection Time: 06/18/17 11:50 AM  Result Value Ref Range Status   Specimen Description BLOOD LEFT HAND  Final   Special Requests   Final    BOTTLES DRAWN AEROBIC AND ANAEROBIC Blood Culture adequate volume   Culture NO GROWTH 5 DAYS  Final   Report Status 06/23/2017 FINAL  Final  Culture, blood (routine x 2)     Status: None   Collection Time: 06/18/17  1:00 PM  Result Value Ref Range Status   Specimen Description  BLOOD LEFT WRIST  Final   Special Requests   Final    BOTTLES DRAWN AEROBIC AND ANAEROBIC Blood Culture adequate volume   Culture NO GROWTH 5 DAYS  Final   Report Status 06/23/2017 FINAL  Final  Culture, Urine     Status: None   Collection Time: 06/18/17  5:52 PM  Result Value Ref Range Status   Specimen Description URINE, CATHETERIZED  Final   Special Requests NONE  Final   Culture NO GROWTH  Final   Report Status 06/19/2017 FINAL  Final  MRSA PCR Screening     Status: None   Collection Time: 06/19/17  1:55 PM  Result Value Ref Range Status   MRSA by PCR NEGATIVE NEGATIVE Final    Comment:        The GeneXpert MRSA Assay (FDA approved for NASAL specimens only), is one component of a comprehensive MRSA colonization surveillance program. It is not intended to diagnose MRSA infection nor to guide or monitor treatment  for MRSA infections.       Radiology Studies: No results found.   Scheduled Meds: . carvedilol  3.125 mg Oral BID WC  . clopidogrel  75 mg Oral Daily  . docusate sodium  100 mg Oral BID  . FLUoxetine  40 mg Oral Daily  . heparin  5,000 Units Subcutaneous Q8H  . insulin aspart  0-20 Units Subcutaneous TID WC  . insulin aspart  0-5 Units Subcutaneous QHS  . insulin aspart  20 Units Subcutaneous TID WC  . insulin glargine  40 Units Subcutaneous BID  . levothyroxine  50 mcg Oral BH-q7a  . metoCLOPramide  10 mg Oral QID  . oxybutynin  10 mg Oral Daily  . rosuvastatin  40 mg Oral Daily  . senna-docusate  2 tablet Oral BID   Continuous Infusions: . sodium chloride 75 mL/hr at 06/25/17 2208  . sodium chloride Stopped (06/24/17 1900)  . methocarbamol (ROBAXIN)  IV      Pamella Pert, MD, PhD [facility] Pager [phone]  If 7PM-7AM, please contact night-coverage [email or web address] Password [email or web address] 06/26/2017, 2:20 PM

## 2017-06-26 NOTE — Care Management Note (Signed)
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          Eric Form (Brother) Elder Cyphers (Mother)    [phone]     PCP: Loyal Jacobson  Action/Plan: CM received consult for home health needs, equipment , medication needs. PT/OT  Evaluations pending .... CM to f/u with disposition needs.  Expected Discharge Date:  06/26/17               Expected Discharge Plan:  Skilled Nursing Facility  In-House Referral:  Clinical Social Work  Discharge planning Services  CM Consult  Post Acute Care Choice:  NA Choice offered to:  NA  DME Arranged:    DME Agency:     HH Arranged:    HH Agency:     Status of Service:  Completed, signed off  If discussed at [employer] of Stay Meetings, dates discussed:    Discharge Disposition: skilled facility   Additional Comments:  06/26/17- 1520- Grenda Lora RN, CM- pt stable for d/c today- CSW following for SNF needs- plan for [facility].   06/25/17- 1445- Donn Pierini RN, CM- received fax from pt's [facility] provider regarding Transitions of Care Plan review- for [employer] [facility] [employer] services- spoke with pt at bedside- pt does confirm that she has this service with her insurance provider- plan is for d/c to [facility]- provided pt with paperwork for Transitions of Care Plan that she needs to discuss with her doctor and sign then fax back to [facility] for the [facility]. Pt to read fax- and call # provided on fax if she has any questions- CM attempted to call Mellody Drown at [facility] regarding fax- however Herbert Seta out of office- contact- [phone]  Care plan ID - [id number]  Darrold Span, RN 06/26/2017, 3:27  PM [phone]- 5W coverage

## 2017-06-26 NOTE — Clinical Social Work Note (Signed)
Clinical Social Work Assessment  Patient Details  Name: [name] MRN: [id number] Date of Birth: 1960-11-27  Date of referral:  06/26/17               Reason for consult:  Facility Placement                Permission sought to share information with:  Facility Medical sales representative, Family Supports Permission granted to share information::  Yes, Verbal Permission Granted  Name::     Britta Mccreedy  Agency::  SNFs  Relationship::  Mother  Contact Information:  [phone]  Housing/Transportation Living arrangements for the past 2 months:  Single Family Home Source of Information:  Patient, Parent Patient Interpreter Needed:  None Criminal Activity/Legal Involvement Pertinent to Current Situation/Hospitalization:  No - Comment as needed Significant Relationships:  Parents Lives with:  Self Do you feel safe going back to the place where you live?  No Need for family participation in patient care:  No (Coment)  Care giving concerns:  CSW received consult for possible SNF placement at time of discharge. CSW spoke with patient regarding PT recommendation of SNF placement at time of discharge. Patient reported that patient's family is currently unable to care for patient at their home given patient's current physical needs and fall risk. Patient expressed understanding of PT recommendation and is agreeable to SNF placement at time of discharge. CSW to continue to follow and assist with discharge planning needs.   Social Worker assessment / plan:  CSW spoke with patient concerning possibility of rehab at [facility] before returning home.  Employment status:  Disabled (Comment on whether or not currently receiving Disability) Insurance information:  Managed Medicare PT Recommendations:  Skilled Nursing Facility Information / Referral to community resources:  Skilled Nursing Facility  Patient/Family's Response to care:  Patient recognizes need for rehab before returning home and is agreeable to a  SNF in [place]. Patient reported preference for [facility]. CSW explained potential barrier with getting insurance authorization.   Patient/Family's Understanding of and Emotional Response to Diagnosis, Current Treatment, and Prognosis:  Patient/family is realistic regarding therapy needs and expressed being hopeful for SNF placement. Patient expressed understanding of CSW role and discharge process as well as medical condition. No questions/concerns about plan or treatment.    Emotional Assessment Appearance:  Appears stated age Attitude/Demeanor/Rapport:  Other(Appropriate) Affect (typically observed):  Accepting, Appropriate Orientation:  Oriented to Self, Oriented to Situation, Oriented to Place, Oriented to  Time Alcohol / Substance use:  Not Applicable Psych involvement (Current and /or in the community):  No (Comment)  Discharge Needs  Concerns to be addressed:  Care Coordination Readmission within the last 30 days:  No Current discharge risk:  None Barriers to Discharge:  No Barriers Identified   Mearl Latin, LCSWA 06/26/2017, 1:29 PM

## 2017-06-26 NOTE — Discharge Summary (Signed)
Physician Discharge Summary  [name] NID:[id number] DOB: February 15, 1961 DOA: 06/18/2017  PCP: Nichole Jacobson, MD  Admit date: 06/18/2017 Discharge date: 06/26/2017  Admitted From: home Disposition:  SNF  Recommendations for Outpatient Follow-up:  1. Follow up with PCP in 1-2 weeks 2. Follow up with Dr. Lajoyce Corners in 1-2 weeks  Home Health: none Equipment/Devices: none  Discharge Condition: stable CODE STATUS: Full code Diet recommendation: diabetic  HPI: Per Dr. Konrad Dolores, [name] is a 56 y.o. female with medical history significant of diabetes, TIA, PVD, OSA on CPAP, hypothyroidism, hypertension, hyperlipidemia, GERD, diabetic neuropathy, depression, COPD, numerous previous lower extremity amputations. Patient reports being in her normal state of health until approximately 06/17/2017 which he developed bleeding and drainage from her left foot. Denies any recent trauma to the foot. Symptoms worsened significantly on day of admission as patient noted marked swelling and tenderness throughout her foot. When she tried to remove her socks on the morning of admission the top layer skin came off with the sock. Denies any fevers, chills, chest pain, shortness breath, palpitations, abdominal pain, dysuria, flank pain, neck stiffness, headache, focal neurological deficits. Patient reports taking her typical daily medications on day of admission. States her home glucose to be relatively 300 400 range.   Hospital Course: Discharge Diagnoses:  Active Problems:   Diabetes mellitus type 2, uncontrolled, with complications (HCC)   Cellulitis   Chronic diastolic heart failure (HCC)   Diabetic foot ulcer (HCC)   Hyperglycemia   CKD (chronic kidney disease), stage III (HCC)   GERD (gastroesophageal reflux disease)   Abnormal urinalysis   Diabetic gastroparesis (HCC)   History of TIA (transient ischemic attack)   Diabetic foot infection -patient was admitted to the hospital with diabetic  foot infection.  She was placed on broad-spectrum antibiotics with vancomycin and cefepime and orthopedic surgery was consulted.  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  Her antibiotics were discontinued after the surgery, and she has remained afebrile.  Diabetes -Poorly controlled, BG 801 mg/dLadmission, patient has continued ongoing up titration of her insulin regimen.  Continue to monitor as per SNF protocol, and continue to uptitrate as indicated AKI versus CKD -Looks like CKD stage III is her baseline, but baseline unclear, possibly around 1.3.  Her creatinine has remained stable while hospitalized, did have a slight increase to 1.5 and decreased urinary output, and her Lasix was discontinued at that time and she received a small fluid bolus.  She improved afterwards, and her creatinine has now remained stable at 1.3. I would change her Lasix from daily to daily as needed Hypertension, chronic diastolic CHF, CV disease (TIA) secondary prevention -Euvolemic, hypertensive. Echocardiogram EF 60%, grade 1 DD -Continue carvedilol, hold Lasix as above Other medications -Continue Reglan, Continue Prozac Anemia of chronic disease/CKD/component of acute blood loss anemia postop -operatively her hemoglobin was as low as 6.8, she was initially resistant to transfusion but eventually received 2 units of packed red blood cells with appropriate increase in her hemoglobin.  No evidence of bleeding.   Discharge Instructions   Allergies as of 06/26/2017      Reactions   Penicillins Hives   Has patient had a PCN reaction causing immediate rash, facial/tongue/throat swelling, SOB or lightheadedness with hypotension: No Has patient had a PCN reaction causing severe rash involving mucus membranes or skin necrosis: No Has patient had a PCN reaction that required hospitalization No Has  patient had a PCN reaction occurring within the last 10  years: No If all of the above answers are "NO", then may proceed with Cephalosporin use.   Erythromycin Diarrhea   Gadolinium Nausea And Vomiting    Code: VOM, Desc: Pt began vomiting immed post infusion of multihance, Onset Date: [id number]   Iodine-131 Nausea And Vomiting   Ivp Dye [iodinated Diagnostic Agents] Nausea And Vomiting      Metformin Diarrhea      Medication List    STOP taking these medications   amLODipine 10 MG tablet Commonly known as:  NORVASC   doxycycline 100 MG tablet Commonly known as:  VIBRA-TABS   HUMULIN R U-500 KWIKPEN 500 UNIT/ML kwikpen Generic drug:  insulin regular human CONCENTRATED   insulin regular 100 units/mL injection Commonly known as:  NOVOLIN R,HUMULIN R   lisinopril 5 MG tablet Commonly known as:  PRINIVIL,ZESTRIL   meclizine 25 MG tablet Commonly known as:  ANTIVERT   silver sulfADIAZINE 1 % cream Commonly known as:  SILVADENE   sulfamethoxazole-trimethoprim 800-160 MG tablet Commonly known as:  BACTRIM DS,SEPTRA DS     TAKE these medications   carvedilol 3.125 MG tablet Commonly known as:  COREG Take 1 tablet (3.125 mg total) by mouth 2 (two) times daily with a meal.   clopidogrel 75 MG tablet Commonly known as:  PLAVIX Take 1 tablet (75 mg total) by mouth daily.   EASY COMFORT LANCETS Misc test blood sugar THREE TIMES DAILY   fenofibrate 160 MG tablet Take 1 tablet (160 mg total) by mouth daily.   FLUoxetine 20 MG tablet Commonly known as:  PROZAC Take 40 mg by mouth daily.   furosemide 40 MG tablet Commonly known as:  LASIX Take 0.5 tablets (20 mg total) daily as needed by mouth. What changed:    when to take this  reasons to take this   insulin aspart 100 UNIT/ML injection Commonly known as:  novoLOG Inject 15 Units 3 (three) times daily with meals into the skin.   insulin glargine 100 UNIT/ML injection Commonly known as:  LANTUS Inject 0.35 mLs (35 Units total) 2 (two) times daily into the skin.     levothyroxine 25 MCG tablet Commonly known as:  SYNTHROID, LEVOTHROID Take 2 tablets (50 mcg total) by mouth every morning.   metoCLOPramide 10 MG tablet Commonly known as:  REGLAN Take 10 mg by mouth 4 (four) times daily. Take 1 tablet (10 mg) by mouth 30 minutes before meals and at bedtime   oxybutynin 5 MG 24 hr tablet Commonly known as:  DITROPAN-XL Take 10 mg by mouth daily.   oxyCODONE 5 MG immediate release tablet Commonly known as:  Oxy IR/ROXICODONE Take 1-2 tablets (5-10 mg total) every 6 (six) hours as needed for up to 16 doses by mouth for moderate pain.   rosuvastatin 20 MG tablet Commonly known as:  CRESTOR Take 40 mg by mouth daily.       Contact information for follow-up providers    Nadara Mustard, MD Follow up in 1 week(s).   Specialty:  Orthopedic Surgery Contact information: [address] [place] [address] [phone]            Contact information for after-discharge care    Destination    [facility] .   Service:  Skilled Paramedic information: [address] [name] [address] [phone]  Consultations:  Orthopedic surgery  Procedures/Studies:  Left BKA 11/2, Dr. Lajoyce Corners  Dg Foot Complete Left  Result Date: 06/18/2017 CLINICAL DATA:  Open foot wounds.  Evaluate for osteomyelitis. EXAM: LEFT FOOT - COMPLETE 3+ VIEW COMPARISON:  Left foot x-rays dated December 07, 2014. FINDINGS: Postsurgical changes related to prior amputation of the great toe and first metatarsal, as well as the second and third toes. No acute fracture or malalignment. No definite osteolysis or cortical destruction. Mild chronic cortical thickening of the second metatarsal shaft is unchanged. Severe midfoot degenerative changes are again noted. Diffuse soft tissue swelling of the forefoot with focal lucency at along the plantar aspect at the base of the medial cuneiform. IMPRESSION: 1.  Plantar ulceration at the base of the medial cuneiform with diffuse soft tissue swelling of the forefoot, consistent with cellulitis. No radiographic evidence of osteomyelitis. 2. Stable midfoot neuropathic changes and postsurgical changes from prior amputations. Electronically Signed   By: Obie Dredge M.D.   On: 06/18/2017 12:46     Subjective: - no chest pain, shortness of breath, no abdominal pain, nausea or vomiting.   Discharge Exam: Vitals:   06/26/17 0858 06/26/17 1336  BP: (!) 172/58 (!) 147/63  Pulse: 66 68  Resp:  16  Temp:  98.3 F (36.8 C)  SpO2:  95%    General: Pt is alert, awake, not in acute distress Cardiovascular: RRR, S1/S2 +, no rubs, no gallops Respiratory: CTA bilaterally, no wheezing, no rhonchi Abdominal: Soft, NT, ND, bowel sounds + Extremities: no edema, no cyanosis, bilateral BKA    The results of significant diagnostics from this hospitalization (including imaging, microbiology, ancillary and laboratory) are listed below for reference.     Microbiology: Recent Results (from the past 240 hour(s))  Culture, blood (routine x 2)     Status: None   Collection Time: 06/18/17 11:50 AM  Result Value Ref Range Status   Specimen Description BLOOD LEFT HAND  Final   Special Requests   Final    BOTTLES DRAWN AEROBIC AND ANAEROBIC Blood Culture adequate volume   Culture NO GROWTH 5 DAYS  Final   Report Status 06/23/2017 FINAL  Final  Culture, blood (routine x 2)     Status: None   Collection Time: 06/18/17  1:00 PM  Result Value Ref Range Status   Specimen Description BLOOD LEFT WRIST  Final   Special Requests   Final    BOTTLES DRAWN AEROBIC AND ANAEROBIC Blood Culture adequate volume   Culture NO GROWTH 5 DAYS  Final   Report Status 06/23/2017 FINAL  Final  Culture, Urine     Status: None   Collection Time: 06/18/17  5:52 PM  Result Value Ref Range Status   Specimen Description URINE, CATHETERIZED  Final   Special Requests NONE  Final   Culture  NO GROWTH  Final   Report Status 06/19/2017 FINAL  Final  MRSA PCR Screening     Status: None   Collection Time: 06/19/17  1:55 PM  Result Value Ref Range Status   MRSA by PCR NEGATIVE NEGATIVE Final    Comment:        The GeneXpert MRSA Assay (FDA approved for NASAL specimens only), is one component of a comprehensive MRSA colonization surveillance program. It is not intended to diagnose MRSA infection nor to guide or monitor treatment for MRSA infections.      Labs: BNP (last 3 results) Recent Labs    07/22/16 0533  BNP 13.7   Basic  Metabolic Panel: Recent Labs  Lab 06/22/17 0403 06/23/17 0623 06/24/17 0529 06/25/17 0654 06/26/17 0531  NA 139 134* 134* 136 136  K 4.6 3.9 4.1 4.4 4.2  CL 103 101 99* 104 104  CO2 27 23 25 25 24   GLUCOSE 249* 278* 317* 317* 282*  BUN 22* 20 22* 21* 19  CREATININE 1.15* 1.35* 1.57* 1.34* 1.34*  CALCIUM 9.4 8.8* 8.8* 8.8* 9.0   Liver Function Tests: No results for input(s): AST, ALT, ALKPHOS, BILITOT, PROT, ALBUMIN in the last 168 hours. No results for input(s): LIPASE, AMYLASE in the last 168 hours. No results for input(s): AMMONIA in the last 168 hours. CBC: Recent Labs  Lab 06/22/17 0403 06/23/17 0623 06/24/17 0529 06/25/17 0654 06/26/17 0531  WBC 4.3 6.2 5.8 6.3 6.5  HGB 9.3* 8.7* 7.8* 6.8* 9.4*  HCT 27.8* 26.5* 23.9* 21.0* 28.2*  MCV 82.5 83.1 83.9 85.0 83.9  PLT 283 262 252 255 257   Cardiac Enzymes: No results for input(s): CKTOTAL, CKMB, CKMBINDEX, TROPONINI in the last 168 hours. BNP: Invalid input(s): POCBNP CBG: Recent Labs  Lab 06/25/17 1217 06/25/17 1718 06/25/17 2149 06/26/17 0802 06/26/17 1204  GLUCAP 274* 345* 277* 274* 258*   D-Dimer No results for input(s): DDIMER in the last 72 hours. Hgb A1c No results for input(s): HGBA1C in the last 72 hours. Lipid Profile No results for input(s): CHOL, HDL, LDLCALC, TRIG, CHOLHDL, LDLDIRECT in the last 72 hours. Thyroid function studies No results  for input(s): TSH, T4TOTAL, T3FREE, THYROIDAB in the last 72 hours.  Invalid input(s): FREET3 Anemia work up No results for input(s): VITAMINB12, FOLATE, FERRITIN, TIBC, IRON, RETICCTPCT in the last 72 hours. Urinalysis    Component Value Date/Time   COLORURINE YELLOW 06/18/2017 1310   APPEARANCEUR HAZY (A) 06/18/2017 1310   LABSPEC 1.018 06/18/2017 1310   PHURINE 5.0 06/18/2017 1310   GLUCOSEU >=500 (A) 06/18/2017 1310   HGBUR MODERATE (A) 06/18/2017 1310   HGBUR negative 03/25/2010 1147   BILIRUBINUR NEGATIVE 06/18/2017 1310   KETONESUR NEGATIVE 06/18/2017 1310   PROTEINUR 30 (A) 06/18/2017 1310   UROBILINOGEN 0.2 12/07/2014 2125   NITRITE NEGATIVE 06/18/2017 1310   LEUKOCYTESUR TRACE (A) 06/18/2017 1310   Sepsis Labs Invalid input(s): PROCALCITONIN,  WBC,  LACTICIDVEN   Time coordinating discharge: 42 minutes  SIGNED:  06/20/2017, MD  [facility] 06/26/2017, 2:54 PM Pager [phone]  If 7PM-7AM, please contact night-coverage [email or web address] Password [email or web address]

## 2017-06-26 NOTE — Progress Notes (Signed)
Physical Therapy Treatment Patient Details Name: [name] MRN: [id number] DOB: September 05, 1960 Today's Date: 06/26/2017    History of Present Illness 56 y.o. female with medical history significant of diabetes, TIA, PVD, OSA on CPAP, hypothyroidism, hypertension, hyperlipidemia, GERD, diabetic neuropathy, depression, COPD, numerous previous lower extremity amputations.   Pt with old ri ght BKA (has prosthesis) and now new left BKA    PT Comments    Patient progressing well towards PT goals. Continues to report lots of pain through left residual limb. Tolerated AP transfer to chair with Min A and cues for technique. Tolerated there ex supine and sitting in chair. Discussed pressure relief techniques to prevent sores on bottom. Instructed pt in chair dips for UE strengthening. Will continue to follow and progress as tolerated.   Follow Up Recommendations  SNF     Equipment Recommendations  None recommended by PT    Recommendations for Other Services       Precautions / Restrictions Precautions Precautions: Fall Restrictions Weight Bearing Restrictions: Yes LLE Weight Bearing: Non weight bearing Other Position/Activity Restrictions: pt with new left BKA - no weight bearing on left    Mobility  Bed Mobility Overal bed mobility: Needs Assistance Bed Mobility: Supine to Sit     Supine to sit: Mod assist;HOB elevated     General bed mobility comments: Pt able to come to long sitting with Mod A for trunk support.   Transfers Overall transfer level: Needs assistance   Transfers: Licensed conveyancer transfers: Min assist   General transfer comment: Able to perform AP transfer into chair with cues for technique and Min A for scooting at times.   Ambulation/Gait                 Stairs            Wheelchair Mobility    Modified Rankin (Stroke Patients Only)       Balance Overall balance assessment: Needs  assistance Sitting-balance support: Feet unsupported;No upper extremity supported Sitting balance-[name]: Fair Sitting balance - Comments: Able to perform long sitting without difficulties and sit without back support in chair.                                    Cognition Arousal/Alertness: Awake/alert Behavior During Therapy: Flat affect Overall Cognitive Status: History of cognitive impairments - at baseline                                 General Comments: Flat affect during session. Follows simple 2 step commands without difficulty.       Exercises Amputee Exercises Quad Sets: Left;15 reps;Supine Hip Flexion/Marching: Left;10 reps;Seated Knee Extension: Left;10 reps;Supine Straight Leg Raises: Left;10 reps;Supine Chair Push Up: 10 reps;Seated;Both;Strengthening    General Comments General comments (skin integrity, edema, etc.): Mother present during session. Wound vac intact left residual limb.       Pertinent Vitals/Pain Pain Assessment: 0-10 Pain Score: 8  Pain Location: left residual limb Pain Descriptors / Indicators: Burning;Grimacing;Penetrating;Radiating Pain Intervention(s): Limited activity within patient's tolerance;Premedicated before session;Repositioned;Monitored during session    Home Living                      Prior Function            PT Goals (  current goals can now be found in the care plan section) Progress towards PT goals: Progressing toward goals    Frequency    Min 3X/week      PT Plan Current plan remains appropriate    Co-evaluation              AM-PAC PT "6 Clicks" Daily Activity  Outcome Measure  Difficulty turning over in bed (including adjusting bedclothes, sheets and blankets)?: None Difficulty moving from lying on back to sitting on the side of the bed? : Unable Difficulty sitting down on and standing up from a chair with arms (e.g., wheelchair, bedside commode, etc,.)?:  Unable Help needed moving to and from a bed to chair (including a wheelchair)?: A Little Help needed walking in hospital room?: Total Help needed climbing 3-5 steps with a railing? : Total 6 Click Score: 11    End of Session   Activity Tolerance: Patient tolerated treatment well Patient left: in chair;with family/visitor present;with call bell/phone within reach Nurse Communication: Mobility status;Need for lift equipment PT Visit Diagnosis: Difficulty in walking, not elsewhere classified (R26.2);Muscle weakness (generalized) (M62.81);Other (comment)(bil amputations)     Time: 3016-0109 PT Time Calculation (min) (ACUTE ONLY): 21 min  Charges:  $Therapeutic Activity: 8-22 mins                    G Codes:       Mylo Red, PT, DPT [phone]     Blake Divine A Johannes Everage 06/26/2017, 10:13 AM

## 2017-06-26 NOTE — Progress Notes (Signed)
Woundvac d/c'd. Ace wrap, gauze and kerlex applied.

## 2017-06-26 NOTE — Progress Notes (Signed)
Patient discharge teaching given, including activity, diet, follow-up appoints, and medications. Patient verbalized understanding of all discharge instructions. IV access was d/c'd. Vitals are stable. Skin is intact except as charted in most recent assessments. Pt to be escorted out by PTAR. 

## 2017-06-26 NOTE — Progress Notes (Signed)
Inpatient Diabetes Program Recommendations  AACE/ADA: New Consensus Statement on Inpatient Glycemic Control (2015)  Target Ranges:  Prepandial:              less than 140 mg/dL                            Peak postprandial:    less than 180 mg/dL (1-2 hours)                            Critically ill patients:  140 - 180 mg/dL   Results for NUALA, CHILES (MRN [id number]) as of 06/26/2017 10:58  Ref. Range 06/25/2017 07:43 06/25/2017 10:36 06/25/2017 12:17 06/25/2017 17:18 06/25/2017 21:49 06/26/2017 08:02  Glucose-Capillary Latest Ref Range: 65 - 99 mg/dL 308 (H)  Novolog 31 units  Lantus 35 units 286 (H) 274 (H)  Novolog 27 units 345 (H)  Novolog 31 units 277 (H)  Novolog 3 units  Lantus 35 units 274 (H)  Novolog 27 units  Lantus 35 units    Review of Glycemic Control  Diabetes history:DM2 Outpatient Diabetes medications: Humulin R U500 (Concentrated insulin) 60 units with breakfast, Humulin R U500 50 units with lunch, Humulin R U500 30 units at bedtime Current orders for Inpatient glycemic control: Lantus 35 units BID, Novolog 16 units TID with meals, Novolog 0-20 units TID with meals, Novolog 0-5 units QHS  Inpatient Diabetes Program Recommendations: Insulin - Basal: Please consider increasing Lantus to 45 units BID. Insulin - Meal Coverage:Please consider increasing meal coverage to Novolog 20units TID with meals.  Thanks, Orlando Penner, RN, MSN, CDE Diabetes Coordinator Inpatient Diabetes Program [phone] (Team Pager from 8am to 5pm)

## 2017-06-26 NOTE — Clinical Social Work Placement (Signed)
   CLINICAL SOCIAL WORK PLACEMENT  NOTE  Date:  06/26/2017  Patient Details  Name: [name] MRN: [id number] Date of Birth: 06/27/1961  Clinical Social Work is seeking post-discharge placement for this patient at the Skilled  Nursing Facility level of care (*CSW will initial, date and re-position this form in  chart as items are completed):  Yes   Patient/family provided with [place] Clinical Social Work Department's list of facilities offering this level of care within the geographic area requested by the patient (or if unable, by the patient's family).  Yes   Patient/family informed of their freedom to choose among providers that offer the needed level of care, that participate in Medicare, Medicaid or managed care program needed by the patient, have an available bed and are willing to accept the patient.  Yes   Patient/family informed of [facility]'s ownership interest in [facility] and [facility], as well as of the fact that they are under no obligation to receive care at these facilities.  PASRR submitted to EDS on       PASRR number received on       Existing PASRR number confirmed on 06/26/17     FL2 transmitted to all facilities in geographic area requested by pt/family on 06/26/17     FL2 transmitted to all facilities within larger geographic area on       Patient informed that his/her managed care company has contracts with or will negotiate with certain facilities, including the following:        Yes   Patient/family informed of bed offers received.  Patient chooses bed at [facility]     Physician recommends and patient chooses bed at      Patient to be transferred to [facility] on 06/26/17.  Patient to be transferred to facility by [facility]     Patient family notified on 06/26/17 of transfer.  Name of family member notified:  Mother     PHYSICIAN       Additional Comment:     _______________________________________________ Mearl Latin, LCSWA 06/26/2017, 3:02 PM

## 2017-06-26 NOTE — Progress Notes (Signed)
Patient will DC to: [facility] Anticipated DC date: 06/26/17 Family notified: Mother Transport by: Sharin Mons 3:30pm   Per MD patient ready for DC to [facility]. RN, patient, patient's family, and facility notified of DC. Discharge Summary sent to facility. RN given number for report [phone]). DC packet on chart. Ambulance transport requested for patient.   CSW signing off.  Cristobal Goldmann, [place] Clinical Social Worker [phone]

## 2017-07-06 ENCOUNTER — Encounter (INDEPENDENT_AMBULATORY_CARE_PROVIDER_SITE_OTHER): Payer: Self-pay | Admitting: Family

## 2017-07-06 ENCOUNTER — Ambulatory Visit (INDEPENDENT_AMBULATORY_CARE_PROVIDER_SITE_OTHER): Payer: Medicare HMO | Admitting: Family

## 2017-07-06 VITALS — Ht 65.0 in | Wt 210.0 lb

## 2017-07-06 DIAGNOSIS — Z89512 Acquired absence of left leg below knee: Secondary | ICD-10-CM

## 2017-07-06 NOTE — Progress Notes (Signed)
Post-Op Visit Note   Patient: [name]           Date of Birth: 01-08-1961           MRN: [id number] Visit Date: 07/06/2017 PCP: Loyal Jacobson, MD  Chief Complaint:  Chief Complaint  Patient presents with  . Left Leg - Routine Post Op    06/22/17 left BKA    HPI:  HPI The patient is a 56 year old woman seen today status post left below knee amputation. Is 2 weeks out. Is residing at skilled nursing for rehab.   Ortho Exam Incision well healing with staples in place. No drainage, erythema, odor, or sign of infection. Is consolidating well.  Visit Diagnoses:  1. History of left below knee amputation (HCC)     Plan: daily dial soap cleansing. Apply dry dressings. Follow up in office in 1 week for staple removal. Wear shrinker daily once obtained. Did provide order to [employer] for her prosthesis set up on left.  Follow-Up Instructions: Return in about 1 week (around 07/13/2017) for suture removal.   Imaging: No results found.  Orders:  No orders of the defined types were placed in this encounter.  No orders of the defined types were placed in this encounter.    PMFS History: Patient Active Problem List   Diagnosis Date Noted  . Abnormal urinalysis 06/18/2017  . Diabetic gastroparesis (HCC) 06/18/2017  . History of TIA (transient ischemic attack) 06/18/2017  . Cutaneous abscess of left foot   . Hyperlipidemia 04/10/2017  . Acquired absence of right leg below knee (HCC) 11/29/2016  . Non-pressure chronic ulcer of other part of left foot limited to breakdown of skin (HCC) 10/10/2016  . Left facial numbness   . Cerebrovascular disease   . Diabetic polyneuropathy associated with type 2 diabetes mellitus (HCC)   . Chronic nonintractable headache   . GERD (gastroesophageal reflux disease) 07/21/2016  . TIA (transient ischemic attack) 07/04/2016  . Hyperkalemia 12/07/2015  . Cellulitis in diabetic foot (HCC) 12/05/2015  . Herpes simplex labialis   . Altered  mental status   . Thrush 10/20/2015  . HSV-1 (herpes simplex virus 1) infection   . Hallucinations   . Acute respiratory failure with hypoxia (HCC) 10/14/2015  . Hyperglycemia 10/13/2015  . Diabetes mellitus due to underlying condition with hyperosmolarity without nonketotic hyperglycemic-hyperosmolar coma [facility]) (HCC) 10/13/2015  . CKD (chronic kidney disease), stage III (HCC) 10/13/2015  . AKI (acute kidney injury) (HCC) 10/13/2015  . Diastolic CHF (HCC) 10/13/2015  . Acute encephalopathy 10/13/2015  . 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 Lactic acid acidosis   . Diabetic foot ulcer (HCC) 12/08/2014  . Symptomatic bradycardia 03/04/2014  . OSA on CPAP with oxygen 03/04/2014  . COPD (chronic obstructive pulmonary disease), on home O2 03/04/2014  . Bradycardia 03/04/2014  . Open wound of knee, leg (except thigh), and ankle, complicated 09/17/2013  . Cellulitis 08/16/2013  . Chronic diastolic heart failure (HCC) 08/16/2013  . Sepsis (HCC) 08/16/2013  . SOB (shortness of breath) 04/20/2013  . Proteinuria 01/26/2013  . Acute on chronic kidney failure (HCC) 01/24/2013  . Abscess of groin, right 01/23/2013  . HTN (hypertension) 01/23/2013  . Leukocytosis 01/23/2013  . Anemia of chronic disease 01/23/2013  . Hypothyroidism 01/23/2013  . Hyponatremia 10/06/2011  . HYPERCHOLESTEROLEMIA 08/17/2010  . Depression 02/01/2010  . TOBACCO ABUSE 06/04/2009  . Diabetes mellitus type 2, uncontrolled, with complications (HCC) 08/06/2007   Past Medical History:  Diagnosis Date  .  Anxiety   . Cellulitis   . Chronic diastolic (congestive) heart failure (HCC)   . Complication of anesthesia 01/2013   "didn't know where I was; who I was; talking out the top of my head" (04/21/2013)  . Concussion as a teenager   slight  . COPD (chronic obstructive pulmonary disease) (HCC)    wear oxygen at home  . Depression   . Diabetic neuropathy (HCC)   . GERD (gastroesophageal reflux disease)     . Hyperlipidemia   . Hypertension   . Hypothyroidism    takes Synthroid daily  . MRSA (methicillin resistant staph aureus) culture positive    hx of 2012  . Neuromuscular disorder (HCC)   . OSA on CPAP    and oxygen  . Peripheral vascular disease (HCC)   . Staph infection 2007  . Symptomatic bradycardia 03/04/2014  . TIA (transient ischemic attack)   . Type II diabetes mellitus (HCC)     Family History  Problem Relation Age of Onset  . Hypertension Mother   . Hyperlipidemia Mother   . Hypertrophic cardiomyopathy Mother        has pacer  . Alzheimer's disease Father   . Hypertension Brother   . Hyperlipidemia Brother   . Cancer Maternal Aunt   . Diabetes Paternal Aunt   . Diabetes Paternal Uncle   . Diabetes Paternal Grandmother   . Anesthesia problems Neg Hx   . Hypotension Neg Hx   . Malignant hyperthermia Neg Hx   . Pseudochol deficiency Neg Hx     Past Surgical History:  Procedure Laterality Date  . BLADDER SURGERY  1970's   "stretched the mouth of my bladder" (04/21/2013)  . DILATION AND CURETTAGE OF UTERUS  1980's  . FOOT SURGERY Right 2012   "put pins in one year; amputated toes another OR" (04/21/2013)  . GANGLION CYST EXCISION Left 1990's   "wrist" (04/21/2013)  . I&D Right Groin Wound N/A 01/28/2013   Performed by Shelly Rubenstein, MD at [facility] OR  . INCISION AND DRAINAGE ABSCESS N/A 01/24/2013   Performed by Dortha Schwalbe., MD at [facility] OR  . IRRIGATION AND DEBRIDEMENT EXTREMITY Left 08/25/2013   Performed by Axel Filler, MD at [facility] OR  . LEFT BELOW KNEE AMPUTATION Left 06/22/2017   Performed by Nadara Mustard, MD at [facility] OR  . LEG AMPUTATION BELOW KNEE Right 2012  . PERMANENT PACEMAKER INSERTION N/A 03/05/2014   Performed by Duke Salvia, MD at [facility] CATH LAB  . REFRACTIVE SURGERY Bilateral 2014  . Right Below Knee Amputation Revision Right 12/11/2014   Performed by Nadara Mustard, MD at [facility] OR  . STUMP REVISION Right 12/29/2011   Performed by Nadara Mustard, MD  at [facility] OR  . TOE AMPUTATION Left ?2011    only 2 toes remaning.   . TUBAL LIGATION  1990's  . WRIST SURGERY Right 1980's   "pinched nerve" (04/21/2013)   Social History   Occupational History  . Not on file  Tobacco Use  . Smoking status: Current Every Day Smoker    Packs/day: 0.50    Years: 33.00    Pack years: 16.50    Types: Cigarettes  . Smokeless tobacco: Never Used  Substance and Sexual Activity  . Alcohol use: No  . Drug use: No  . Sexual activity: No

## 2017-07-16 ENCOUNTER — Encounter (INDEPENDENT_AMBULATORY_CARE_PROVIDER_SITE_OTHER): Payer: Self-pay | Admitting: Orthopedic Surgery

## 2017-07-16 ENCOUNTER — Ambulatory Visit (INDEPENDENT_AMBULATORY_CARE_PROVIDER_SITE_OTHER): Payer: Medicare HMO | Admitting: Orthopedic Surgery

## 2017-07-16 DIAGNOSIS — Z89512 Acquired absence of left leg below knee: Secondary | ICD-10-CM

## 2017-07-16 NOTE — Progress Notes (Signed)
Post-Op Visit Note   Patient: [name]           Date of Birth: Jan 01, 1961           MRN: [id number] Visit Date: 07/16/2017 PCP: Loyal Jacobson, MD  Chief Complaint:  Chief Complaint  Patient presents with  . Left Leg - Routine Post Op    06/22/17 left BKA     HPI:  HPI The patient is a 56 year old woman seen today status post left below knee amputation. Is 3 weeks out. Is residing at skilled nursing for rehab.   Ortho Exam Incision well healed with staples in place. No drainage, erythema, odor, or sign of infection. Is consolidating well.  Visit Diagnoses:  1. History of left below knee amputation (HCC)     Plan: staples harvested. daily dial soap cleansing. Apply dry dressings. Follow up in office in 2 months. Wear shrinker daily once obtained. Did provide order to [employer] for her prosthesis set up on left.   Follow-Up Instructions: Return in about 2 months (around 09/15/2017).   Imaging: No results found.  Orders:  No orders of the defined types were placed in this encounter.  No orders of the defined types were placed in this encounter.    PMFS History: Patient Active Problem List   Diagnosis Date Noted  . History of left below knee amputation (HCC) 07/16/2017  . Abnormal urinalysis 06/18/2017  . Diabetic gastroparesis (HCC) 06/18/2017  . History of TIA (transient ischemic attack) 06/18/2017  . Hyperlipidemia 04/10/2017  . Acquired absence of right leg below knee (HCC) 11/29/2016  . Left facial numbness   . Cerebrovascular disease   . Diabetic polyneuropathy associated with type 2 diabetes mellitus (HCC)   . Chronic nonintractable headache   . GERD (gastroesophageal reflux disease) 07/21/2016  . TIA (transient ischemic attack) 07/04/2016  . Hyperkalemia 12/07/2015  . Cellulitis in diabetic foot (HCC) 12/05/2015  . Herpes simplex labialis   . Altered mental status   . Thrush 10/20/2015  . HSV-1 (herpes simplex virus 1) infection   .  Hallucinations   . Acute respiratory failure with hypoxia (HCC) 10/14/2015  . Hyperglycemia 10/13/2015  . Diabetes mellitus due to underlying condition with hyperosmolarity without nonketotic hyperglycemic-hyperosmolar coma [facility]) (HCC) 10/13/2015  . CKD (chronic kidney disease), stage III (HCC) 10/13/2015  . AKI (acute kidney injury) (HCC) 10/13/2015  . Diastolic CHF (HCC) 10/13/2015  . Acute encephalopathy 10/13/2015  . 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 Lactic acid acidosis   . Diabetic foot ulcer (HCC) 12/08/2014  . Symptomatic bradycardia 03/04/2014  . OSA on CPAP with oxygen 03/04/2014  . COPD (chronic obstructive pulmonary disease), on home O2 03/04/2014  . Bradycardia 03/04/2014  . Cellulitis 08/16/2013  . Chronic diastolic heart failure (HCC) 08/16/2013  . Sepsis (HCC) 08/16/2013  . SOB (shortness of breath) 04/20/2013  . Proteinuria 01/26/2013  . Acute on chronic kidney failure (HCC) 01/24/2013  . Abscess of groin, right 01/23/2013  . HTN (hypertension) 01/23/2013  . Leukocytosis 01/23/2013  . Anemia of chronic disease 01/23/2013  . Hypothyroidism 01/23/2013  . Hyponatremia 10/06/2011  . HYPERCHOLESTEROLEMIA 08/17/2010  . Depression 02/01/2010  . TOBACCO ABUSE 06/04/2009  . Diabetes mellitus type 2, uncontrolled, with complications (HCC) 08/06/2007   Past Medical History:  Diagnosis Date  . Anxiety   . Cellulitis   . Chronic diastolic (congestive) heart failure (HCC)   . Complication of anesthesia 01/2013   "didn't know where I was; who I was;  talking out the top of my head" (04/21/2013)  . Concussion as a teenager   slight  . COPD (chronic obstructive pulmonary disease) (HCC)    wear oxygen at home  . Depression   . Diabetic neuropathy (HCC)   . GERD (gastroesophageal reflux disease)   . Hyperlipidemia   . Hypertension   . Hypothyroidism    takes Synthroid daily  . MRSA (methicillin resistant staph aureus) culture positive    hx of 2012  .  Neuromuscular disorder (HCC)   . OSA on CPAP    and oxygen  . Peripheral vascular disease (HCC)   . Staph infection 2007  . Symptomatic bradycardia 03/04/2014  . TIA (transient ischemic attack)   . Type II diabetes mellitus (HCC)     Family History  Problem Relation Age of Onset  . Hypertension Mother   . Hyperlipidemia Mother   . Hypertrophic cardiomyopathy Mother        has pacer  . Alzheimer's disease Father   . Hypertension Brother   . Hyperlipidemia Brother   . Cancer Maternal Aunt   . Diabetes Paternal Aunt   . Diabetes Paternal Uncle   . Diabetes Paternal Grandmother   . Anesthesia problems Neg Hx   . Hypotension Neg Hx   . Malignant hyperthermia Neg Hx   . Pseudochol deficiency Neg Hx     Past Surgical History:  Procedure Laterality Date  . AMPUTATION Left 06/22/2017   Procedure: LEFT BELOW KNEE AMPUTATION;  Surgeon: Nadara Mustard, MD;  Location: [facility] OR;  Service: Orthopedics;  Laterality: Left;  . BLADDER SURGERY  1970's   "stretched the mouth of my bladder" (04/21/2013)  . DILATION AND CURETTAGE OF UTERUS  1980's  . FOOT SURGERY Right 2012   "put pins in one year; amputated toes another OR" (04/21/2013)  . GANGLION CYST EXCISION Left 1990's   "wrist" (04/21/2013)  . I&D EXTREMITY Left 08/25/2013   Procedure: IRRIGATION AND DEBRIDEMENT EXTREMITY;  Surgeon: Axel Filler, MD;  Location: [facility] OR;  Service: General;  Laterality: Left;  . INCISION AND DRAINAGE ABSCESS N/A 01/24/2013   Procedure: INCISION AND DRAINAGE ABSCESS;  Surgeon: Clovis Pu. Cornett, MD;  Location: [facility] OR;  Service: General;  Laterality: N/A;  . INGUINAL HERNIA REPAIR N/A 01/28/2013   Procedure: I&D Right Groin Wound;  Surgeon: Shelly Rubenstein, MD;  Location: [facility] OR;  Service: General;  Laterality: N/A;  . LEG AMPUTATION BELOW KNEE Right 2012  . PERMANENT PACEMAKER INSERTION N/A 03/05/2014   Biotronik dual chamber pacemaker implanted by Dr Graciela Husbands for symptomatic bradycardia  . REFRACTIVE SURGERY Bilateral  2014  . STUMP REVISION Right 12/11/2014   Procedure: Right Below Knee Amputation Revision;  Surgeon: Nadara Mustard, MD;  Location: [facility] OR;  Service: Orthopedics;  Laterality: Right;  . TOE AMPUTATION Left ?2011    only 2 toes remaning.   . TUBAL LIGATION  1990's  . WRIST SURGERY Right 1980's   "pinched nerve" (04/21/2013)   Social History   Occupational History  . Not on file  Tobacco Use  . Smoking status: Current Every Day Smoker    Packs/day: 0.50    Years: 33.00    Pack years: 16.50    Types: Cigarettes  . Smokeless tobacco: Never Used  Substance and Sexual Activity  . Alcohol use: No  . Drug use: No  . Sexual activity: No

## 2017-08-07 ENCOUNTER — Encounter: Payer: Medicare HMO | Admitting: Nurse Practitioner

## 2017-08-08 ENCOUNTER — Encounter: Payer: Medicare HMO | Admitting: Nurse Practitioner

## 2017-08-20 ENCOUNTER — Ambulatory Visit (INDEPENDENT_AMBULATORY_CARE_PROVIDER_SITE_OTHER): Payer: Medicare HMO | Admitting: Orthopedic Surgery

## 2017-08-20 ENCOUNTER — Encounter (INDEPENDENT_AMBULATORY_CARE_PROVIDER_SITE_OTHER): Payer: Self-pay | Admitting: Orthopedic Surgery

## 2017-08-20 DIAGNOSIS — Z89512 Acquired absence of left leg below knee: Secondary | ICD-10-CM

## 2017-08-20 NOTE — Progress Notes (Signed)
Post-Op Visit Note   Patient: [name]           Date of Birth: 1960-10-01           MRN: [id number] Visit Date: 08/20/2017 PCP: Loyal Jacobson, MD  Chief Complaint:  Chief Complaint  Patient presents with  . Left Leg - Routine Post Op    06/22/17 L BKA    HPI:  HPI The patient is a 56 year old woman seen today status post left below knee amputation. Is 2 months out. Is residing at skilled nursing for rehab, [facility]. Has a new power chair.   Ortho Exam Incision well healed. No drainage, erythema, odor, or sign of infection. Is consolidating well.  Visit Diagnoses:  No diagnosis found.  Plan:  Follow up in office in 3 months. Wear shrinker daily once obtained. Did provide another order to [employer] for her prosthesis set up on left.   Follow-Up Instructions: No Follow-up on file.   Imaging: No results found.  Orders:  No orders of the defined types were placed in this encounter.  No orders of the defined types were placed in this encounter.    PMFS History: Patient Active Problem List   Diagnosis Date Noted  . History of left below knee amputation (HCC) 07/16/2017  . Abnormal urinalysis 06/18/2017  . Diabetic gastroparesis (HCC) 06/18/2017  . History of TIA (transient ischemic attack) 06/18/2017  . Hyperlipidemia 04/10/2017  . Acquired absence of right leg below knee (HCC) 11/29/2016  . Left facial numbness   . Cerebrovascular disease   . Diabetic polyneuropathy associated with type 2 diabetes mellitus (HCC)   . Chronic nonintractable headache   . GERD (gastroesophageal reflux disease) 07/21/2016  . TIA (transient ischemic attack) 07/04/2016  . Hyperkalemia 12/07/2015  . Cellulitis in diabetic foot (HCC) 12/05/2015  . Herpes simplex labialis   . Altered mental status   . Thrush 10/20/2015  . HSV-1 (herpes simplex virus 1) infection   . Hallucinations   . Acute respiratory failure with hypoxia (HCC) 10/14/2015  . Hyperglycemia 10/13/2015  .  Diabetes mellitus due to underlying condition with hyperosmolarity without nonketotic hyperglycemic-hyperosmolar coma [facility]) (HCC) 10/13/2015  . CKD (chronic kidney disease), stage III (HCC) 10/13/2015  . AKI (acute kidney injury) (HCC) 10/13/2015  . Diastolic CHF (HCC) 10/13/2015  . Acute encephalopathy 10/13/2015  . 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 Lactic acid acidosis   . Diabetic foot ulcer (HCC) 12/08/2014  . Symptomatic bradycardia 03/04/2014  . OSA on CPAP with oxygen 03/04/2014  . COPD (chronic obstructive pulmonary disease), on home O2 03/04/2014  . Bradycardia 03/04/2014  . Cellulitis 08/16/2013  . Chronic diastolic heart failure (HCC) 08/16/2013  . Sepsis (HCC) 08/16/2013  . SOB (shortness of breath) 04/20/2013  . Proteinuria 01/26/2013  . Acute on chronic kidney failure (HCC) 01/24/2013  . Abscess of groin, right 01/23/2013  . HTN (hypertension) 01/23/2013  . Leukocytosis 01/23/2013  . Anemia of chronic disease 01/23/2013  . Hypothyroidism 01/23/2013  . Hyponatremia 10/06/2011  . HYPERCHOLESTEROLEMIA 08/17/2010  . Depression 02/01/2010  . TOBACCO ABUSE 06/04/2009  . Diabetes mellitus type 2, uncontrolled, with complications (HCC) 08/06/2007   Past Medical History:  Diagnosis Date  . Anxiety   . Cellulitis   . Chronic diastolic (congestive) heart failure (HCC)   . Complication of anesthesia 01/2013   "didn't know where I was; who I was; talking out the top of my head" (04/21/2013)  . Concussion as a teenager   slight  .  COPD (chronic obstructive pulmonary disease) (HCC)    wear oxygen at home  . Depression   . Diabetic neuropathy (HCC)   . GERD (gastroesophageal reflux disease)   . Hyperlipidemia   . Hypertension   . Hypothyroidism    takes Synthroid daily  . MRSA (methicillin resistant staph aureus) culture positive    hx of 2012  . Neuromuscular disorder (HCC)   . OSA on CPAP    and oxygen  . Peripheral vascular disease (HCC)   . Staph  infection 2007  . Symptomatic bradycardia 03/04/2014  . TIA (transient ischemic attack)   . Type II diabetes mellitus (HCC)     Family History  Problem Relation Age of Onset  . Hypertension Mother   . Hyperlipidemia Mother   . Hypertrophic cardiomyopathy Mother        has pacer  . Alzheimer's disease Father   . Hypertension Brother   . Hyperlipidemia Brother   . Cancer Maternal Aunt   . Diabetes Paternal Aunt   . Diabetes Paternal Uncle   . Diabetes Paternal Grandmother   . Anesthesia problems Neg Hx   . Hypotension Neg Hx   . Malignant hyperthermia Neg Hx   . Pseudochol deficiency Neg Hx     Past Surgical History:  Procedure Laterality Date  . AMPUTATION Left 06/22/2017   Procedure: LEFT BELOW KNEE AMPUTATION;  Surgeon: Nadara Mustard, MD;  Location: [facility] OR;  Service: Orthopedics;  Laterality: Left;  . BLADDER SURGERY  1970's   "stretched the mouth of my bladder" (04/21/2013)  . DILATION AND CURETTAGE OF UTERUS  1980's  . FOOT SURGERY Right 2012   "put pins in one year; amputated toes another OR" (04/21/2013)  . GANGLION CYST EXCISION Left 1990's   "wrist" (04/21/2013)  . I&D EXTREMITY Left 08/25/2013   Procedure: IRRIGATION AND DEBRIDEMENT EXTREMITY;  Surgeon: Axel Filler, MD;  Location: [facility] OR;  Service: General;  Laterality: Left;  . INCISION AND DRAINAGE ABSCESS N/A 01/24/2013   Procedure: INCISION AND DRAINAGE ABSCESS;  Surgeon: Clovis Pu. Cornett, MD;  Location: [facility] OR;  Service: General;  Laterality: N/A;  . INGUINAL HERNIA REPAIR N/A 01/28/2013   Procedure: I&D Right Groin Wound;  Surgeon: Shelly Rubenstein, MD;  Location: [facility] OR;  Service: General;  Laterality: N/A;  . LEG AMPUTATION BELOW KNEE Right 2012  . PERMANENT PACEMAKER INSERTION N/A 03/05/2014   Biotronik dual chamber pacemaker implanted by Dr Graciela Husbands for symptomatic bradycardia  . REFRACTIVE SURGERY Bilateral 2014  . STUMP REVISION Right 12/11/2014   Procedure: Right Below Knee Amputation Revision;  Surgeon: Nadara Mustard, MD;  Location: [facility] OR;  Service: Orthopedics;  Laterality: Right;  . TOE AMPUTATION Left ?2011    only 2 toes remaning.   . TUBAL LIGATION  1990's  . WRIST SURGERY Right 1980's   "pinched nerve" (04/21/2013)   Social History   Occupational History  . Not on file  Tobacco Use  . Smoking status: Current Every Day Smoker    Packs/day: 0.50    Years: 33.00    Pack years: 16.50    Types: Cigarettes  . Smokeless tobacco: Never Used  Substance and Sexual Activity  . Alcohol use: No  . Drug use: No  . Sexual activity: No

## 2017-09-18 ENCOUNTER — Ambulatory Visit (INDEPENDENT_AMBULATORY_CARE_PROVIDER_SITE_OTHER): Payer: Medicare HMO | Admitting: Orthopedic Surgery

## 2017-10-18 ENCOUNTER — Ambulatory Visit (INDEPENDENT_AMBULATORY_CARE_PROVIDER_SITE_OTHER): Payer: Medicare HMO | Admitting: Orthopedic Surgery

## 2017-11-15 ENCOUNTER — Encounter (INDEPENDENT_AMBULATORY_CARE_PROVIDER_SITE_OTHER): Payer: Self-pay | Admitting: Orthopedic Surgery

## 2017-11-15 ENCOUNTER — Ambulatory Visit (INDEPENDENT_AMBULATORY_CARE_PROVIDER_SITE_OTHER): Payer: Medicare HMO | Admitting: Orthopedic Surgery

## 2017-11-15 VITALS — Ht 65.0 in | Wt 210.0 lb

## 2017-11-15 DIAGNOSIS — Z89512 Acquired absence of left leg below knee: Secondary | ICD-10-CM

## 2017-11-15 DIAGNOSIS — Z89511 Acquired absence of right leg below knee: Secondary | ICD-10-CM

## 2017-11-15 NOTE — Progress Notes (Signed)
Office Visit Note   Patient: [name]           Date of Birth: 1961/07/31           MRN: [id number] Visit Date: 11/15/2017              Requested by: Loyal Jacobson, MD [address] [place], [place] [address] PCP: Loyal Jacobson, MD  Chief Complaint  Patient presents with  . Left Leg - Follow-up    S/p left BKA 06/22/17      HPI: Patient is a 57 year old woman status post right transtibial amputation wearing a prosthesis she is status post a left transtibial amputation about 5 months ago.  She is currently residing at [facility] she is in a motorized wheelchair and she states that she will be obtaining her prosthetic fitting on the left next Tuesday.  Assessment & Plan: Visit Diagnoses:  1. History of left below knee amputation (HCC)   2. Acquired absence of right leg below knee [facility])     Plan: Plan to begin gait training with bilateral transtibial amputations.  Follow-Up Instructions: Return in about 2 months (around 01/15/2018).   Ortho Exam  Patient is alert, oriented, no adenopathy, well-dressed, normal affect, normal respiratory effort. Examination patient ambulates in a motorized wheelchair.  She has a well consolidated residual limb bilaterally wearing the prosthesis on the right left leg shows no signs of infection or ulceration.  Imaging: No results found. No images are attached to the encounter.  Labs: Lab Results  Component Value Date   HGBA1C >15.5 (H) 06/18/2017   HGBA1C 12.6 (H) 07/05/2016   HGBA1C 10.0 (H) 12/05/2015   ESRSEDRATE >140 (H) 12/05/2015   ESRSEDRATE 119 (H) 10/21/2015   ESRSEDRATE 70 (H) 12/07/2014   CRP 22.8 (H) 12/05/2015   CRP 3.4 (H) 10/21/2015   CRP 27.6 (H) 12/07/2014   REPTSTATUS 06/19/2017 FINAL 06/18/2017   GRAMSTAIN  10/22/2015    CYTOSPIN SMEAR WBC PRESENT, PREDOMINANTLY MONONUCLEAR NO ORGANISMS SEEN    CULT NO GROWTH 06/18/2017   LABORGA ESCHERICHIA COLI 10/01/2015     @LABSALLVALUES (HGBA1)@  Body mass index is 34.95 kg/m.  Orders:  No orders of the defined types were placed in this encounter.  No orders of the defined types were placed in this encounter.    Procedures: No procedures performed  Clinical Data: No additional findings.  ROS:  All other systems negative, except as noted in the HPI. Review of Systems  Objective: Vital Signs: Ht 5\' 5"  (1.651 m)   Wt 210 lb (95.3 kg)   BMI 34.95 kg/m   Specialty Comments:  No specialty comments available.  PMFS History: Patient Active Problem List   Diagnosis Date Noted  . History of left below knee amputation (HCC) 07/16/2017  . Abnormal urinalysis 06/18/2017  . Diabetic gastroparesis (HCC) 06/18/2017  . History of TIA (transient ischemic attack) 06/18/2017  . Hyperlipidemia 04/10/2017  . Acquired absence of right leg below knee (HCC) 11/29/2016  . Left facial numbness   . Cerebrovascular disease   . Diabetic polyneuropathy associated with type 2 diabetes mellitus (HCC)   . Chronic nonintractable headache   . GERD (gastroesophageal reflux disease) 07/21/2016  . TIA (transient ischemic attack) 07/04/2016  . Hyperkalemia 12/07/2015  . Cellulitis in diabetic foot (HCC) 12/05/2015  . Herpes simplex labialis   . Altered mental status   . Thrush 10/20/2015  . HSV-1 (herpes simplex virus 1) infection   . Hallucinations   . Acute respiratory  failure with hypoxia (HCC) 10/14/2015  . Hyperglycemia 10/13/2015  . Diabetes mellitus due to underlying condition with hyperosmolarity without nonketotic hyperglycemic-hyperosmolar coma [facility]) (HCC) 10/13/2015  . CKD (chronic kidney disease), stage III (HCC) 10/13/2015  . AKI (acute kidney injury) (HCC) 10/13/2015  . Diastolic CHF (HCC) 10/13/2015  . Acute encephalopathy 10/13/2015  . 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 Lactic acid acidosis   . Diabetic foot ulcer (HCC) 12/08/2014  . Symptomatic bradycardia 03/04/2014  .  OSA on CPAP with oxygen 03/04/2014  . COPD (chronic obstructive pulmonary disease), on home O2 03/04/2014  . Bradycardia 03/04/2014  . Cellulitis 08/16/2013  . Chronic diastolic heart failure (HCC) 08/16/2013  . Sepsis (HCC) 08/16/2013  . SOB (shortness of breath) 04/20/2013  . Proteinuria 01/26/2013  . Acute on chronic kidney failure (HCC) 01/24/2013  . Abscess of groin, right 01/23/2013  . HTN (hypertension) 01/23/2013  . Leukocytosis 01/23/2013  . Anemia of chronic disease 01/23/2013  . Hypothyroidism 01/23/2013  . Hyponatremia 10/06/2011  . HYPERCHOLESTEROLEMIA 08/17/2010  . Depression 02/01/2010  . TOBACCO ABUSE 06/04/2009  . Diabetes mellitus type 2, uncontrolled, with complications (HCC) 08/06/2007   Past Medical History:  Diagnosis Date  . Anxiety   . Cellulitis   . Chronic diastolic (congestive) heart failure (HCC)   . Complication of anesthesia 01/2013   "didn't know where I was; who I was; talking out the top of my head" (04/21/2013)  . Concussion as a teenager   slight  . COPD (chronic obstructive pulmonary disease) (HCC)    wear oxygen at home  . Depression   . Diabetic neuropathy (HCC)   . GERD (gastroesophageal reflux disease)   . Hyperlipidemia   . Hypertension   . Hypothyroidism    takes Synthroid daily  . MRSA (methicillin resistant staph aureus) culture positive    hx of 2012  . Neuromuscular disorder (HCC)   . OSA on CPAP    and oxygen  . Peripheral vascular disease (HCC)   . Staph infection 2007  . Symptomatic bradycardia 03/04/2014  . TIA (transient ischemic attack)   . Type II diabetes mellitus (HCC)     Family History  Problem Relation Age of Onset  . Hypertension Mother   . Hyperlipidemia Mother   . Hypertrophic cardiomyopathy Mother        has pacer  . Alzheimer's disease Father   . Hypertension Brother   . Hyperlipidemia Brother   . Cancer Maternal Aunt   . Diabetes Paternal Aunt   . Diabetes Paternal Uncle   . Diabetes Paternal  Grandmother   . Anesthesia problems Neg Hx   . Hypotension Neg Hx   . Malignant hyperthermia Neg Hx   . Pseudochol deficiency Neg Hx     Past Surgical History:  Procedure Laterality Date  . AMPUTATION Left 06/22/2017   Procedure: LEFT BELOW KNEE AMPUTATION;  Surgeon: Nadara Mustard, MD;  Location: [facility] OR;  Service: Orthopedics;  Laterality: Left;  . BLADDER SURGERY  1970's   "stretched the mouth of my bladder" (04/21/2013)  . DILATION AND CURETTAGE OF UTERUS  1980's  . FOOT SURGERY Right 2012   "put pins in one year; amputated toes another OR" (04/21/2013)  . GANGLION CYST EXCISION Left 1990's   "wrist" (04/21/2013)  . I&D EXTREMITY Left 08/25/2013   Procedure: IRRIGATION AND DEBRIDEMENT EXTREMITY;  Surgeon: Axel Filler, MD;  Location: [facility] OR;  Service: General;  Laterality: Left;  . INCISION AND DRAINAGE ABSCESS N/A 01/24/2013   Procedure: INCISION  AND DRAINAGE ABSCESS;  Surgeon: Clovis Pu. Cornett, MD;  Location: [facility] OR;  Service: General;  Laterality: N/A;  . INGUINAL HERNIA REPAIR N/A 01/28/2013   Procedure: I&D Right Groin Wound;  Surgeon: Shelly Rubenstein, MD;  Location: [facility] OR;  Service: General;  Laterality: N/A;  . LEG AMPUTATION BELOW KNEE Right 2012  . PERMANENT PACEMAKER INSERTION N/A 03/05/2014   Biotronik dual chamber pacemaker implanted by Dr Graciela Husbands for symptomatic bradycardia  . REFRACTIVE SURGERY Bilateral 2014  . STUMP REVISION Right 12/11/2014   Procedure: Right Below Knee Amputation Revision;  Surgeon: Nadara Mustard, MD;  Location: [facility] OR;  Service: Orthopedics;  Laterality: Right;  . TOE AMPUTATION Left ?2011    only 2 toes remaning.   . TUBAL LIGATION  1990's  . WRIST SURGERY Right 1980's   "pinched nerve" (04/21/2013)   Social History   Occupational History  . Not on file  Tobacco Use  . Smoking status: Current Every Day Smoker    Packs/day: 0.50    Years: 33.00    Pack years: 16.50    Types: Cigarettes  . Smokeless tobacco: Never Used  Substance and Sexual  Activity  . Alcohol use: No  . Drug use: No  . Sexual activity: Never

## 2018-01-16 ENCOUNTER — Ambulatory Visit (INDEPENDENT_AMBULATORY_CARE_PROVIDER_SITE_OTHER): Payer: Medicare HMO | Admitting: Orthopedic Surgery

## 2018-01-24 ENCOUNTER — Ambulatory Visit (INDEPENDENT_AMBULATORY_CARE_PROVIDER_SITE_OTHER): Payer: Medicare HMO | Admitting: Orthopedic Surgery

## 2018-03-03 ENCOUNTER — Encounter (HOSPITAL_COMMUNITY): Payer: Self-pay | Admitting: Emergency Medicine

## 2018-03-03 ENCOUNTER — Ambulatory Visit (HOSPITAL_COMMUNITY)
Admission: EM | Admit: 2018-03-03 | Discharge: 2018-03-03 | Disposition: A | Discharge status: Home / Self Care | Payer: Medicare HMO

## 2018-03-03 ENCOUNTER — Other Ambulatory Visit: Payer: Self-pay

## 2018-03-03 DIAGNOSIS — L02412 Cutaneous abscess of left axilla: Secondary | ICD-10-CM | POA: Diagnosis not present

## 2018-03-03 MED ORDER — LIDOCAINE-EPINEPHRINE-TETRACAINE (LET) SOLUTION
NASAL | Status: AC
  Filled 2018-03-03: qty 3

## 2018-03-03 MED ORDER — SULFAMETHOXAZOLE-TRIMETHOPRIM 800-160 MG PO TABS: 1.0000 | ORAL_TABLET | Freq: Two times a day (BID) | ORAL | 0 refills | Status: DC

## 2018-03-03 MED ORDER — LIDOCAINE-EPINEPHRINE (PF) 2 %-1:200000 IJ SOLN
INTRAMUSCULAR | Status: AC
  Filled 2018-03-03: qty 20

## 2018-03-03 NOTE — ED Provider Notes (Addendum)
03/03/2018 1:19 PM   DOB: [birth year] / MRN: [id number]  SUBJECTIVE:  [name] is a 57 y.o. female presenting for left axillary pain and abscess.  Taking doxycycline and has been worsening.  No incision and drainage yet.  History of diabetes with BTKA bilaterally. Fortunately vitals normal here.   She is allergic to penicillins; erythromycin; gadolinium; iodine-131; ivp dye [iodinated diagnostic agents]; and metformin.   She  has a past medical history of Anxiety, Cellulitis, Chronic diastolic (congestive) heart failure (HCC), Complication of anesthesia (01/2013), Concussion (as a teenager), COPD (chronic obstructive pulmonary disease) (HCC), Depression, Diabetic neuropathy (HCC), GERD (gastroesophageal reflux disease), Hyperlipidemia, Hypertension, Hypothyroidism, MRSA (methicillin resistant staph aureus) culture positive, Neuromuscular disorder (HCC), OSA on CPAP, Peripheral vascular disease (HCC), Staph infection (2007), Symptomatic bradycardia (03/04/2014), TIA (transient ischemic attack), and Type II diabetes mellitus (HCC).    She  reports that she has been smoking cigarettes.  She has a 16.50 pack-year smoking history. She has never used smokeless tobacco. She reports that she does not drink alcohol or use drugs. She  reports that she does not engage in sexual activity. The patient  has a past surgical history that includes Leg amputation below knee (Right, 2012); Toe amputation (Left, ?2011); Tubal ligation (1990's); Foot surgery (Right, 2012); Wrist surgery (Right, 1980's); Dilation and curettage of uterus (1980's); Refractive surgery (Bilateral, 2014); Incision and drainage abscess (N/A, 01/24/2013); Inguinal hernia repair (N/A, 01/28/2013); Ganglion cyst excision (Left, 1990's); Bladder surgery (1970's); I&D extremity (Left, 08/25/2013); permanent pacemaker insertion (N/A, 03/05/2014); Stump revision (Right, 12/11/2014); and Amputation (Left, 06/22/2017).  Her family history includes Alzheimer's  disease in her father; Cancer in her maternal aunt; Diabetes in her paternal aunt, paternal grandmother, and paternal uncle; Hyperlipidemia in her brother and mother; Hypertension in her brother and mother; Hypertrophic cardiomyopathy in her mother.  Review of Systems  Constitutional: Negative for chills, diaphoresis and fever.  Eyes: Negative.   Respiratory: Negative for cough, hemoptysis, sputum production, shortness of breath and wheezing.   Cardiovascular: Negative for chest pain, orthopnea and leg swelling.  Gastrointestinal: Negative for abdominal pain, blood in stool, constipation, diarrhea, heartburn, melena, nausea and vomiting.  Genitourinary: Negative for dysuria, flank pain, frequency, hematuria and urgency.  Skin: Positive for rash.  Neurological: Negative for dizziness, sensory change, speech change, focal weakness and headaches.    OBJECTIVE:  BP (!) 141/52 (BP Location: Right Arm)   Pulse 68   Temp 98.2 F (36.8 C) (Oral)   Resp 20   SpO2 96%   Wt Readings from Last 3 Encounters:  11/15/17 210 lb (95.3 kg)  07/16/17 210 lb (95.3 kg)  07/06/17 210 lb (95.3 kg)   Temp Readings from Last 3 Encounters:  03/03/18 98.2 F (36.8 C) (Oral)  06/26/17 98.3 F (36.8 C) (Oral)  07/23/16 97.7 F (36.5 C) (Oral)   BP Readings from Last 3 Encounters:  03/03/18 (!) 141/52  06/26/17 (!) 147/63  04/10/17 98/65   Pulse Readings from Last 3 Encounters:  03/03/18 68  06/26/17 68  04/10/17 69    Physical Exam  Constitutional: She is oriented to person, place, and time. She appears well-nourished. No distress.  Eyes: Pupils are equal, round, and reactive to light. EOM are normal.  Cardiovascular: Normal rate.  Pulmonary/Chest: Effort normal.  Abdominal: She exhibits no distension.  Neurological: She is alert and oriented to person, place, and time. No cranial nerve deficit. Gait normal.  Skin: Skin is dry. She is not diaphoretic.     Psychiatric:  She has a normal mood  and affect.  Vitals reviewed.  Risk and benefits discussed and verbal consent obtained. Anesthetic allergies reviewed. Patient anesthetized using 1:1 mix of 2% lidocaine with epi. A 1 cm incision was made using a number 11 blade and purulent material was expressed.  The was not wound packed. The patient tolerated the procedure without difficulty.   A clean dressing was placed and wound care instructions were provided.    No results found for this or any previous visit (from the past 72 hour(s)).  No results found.  ASSESSMENT AND PLAN:   Abscess of left axilla: Adding septra. No culture given already taking doxy.     Discharge Instructions     If you begin having fever, sweats, dizziness then go to the ED.         The patient is advised to call or return to clinic if she does not see an improvement in symptoms, or to seek the care of the closest emergency department if she worsens with the above plan.   Deliah [place], MHS, PA-C 03/03/2018 1:19 PM   Ofilia Neas, PA-C 03/03/18 1319    Ofilia Neas, PA-C 03/03/18 1321

## 2018-03-03 NOTE — Discharge Instructions (Addendum)
If you begin having fever, sweats, dizziness then go to the ED.

## 2018-03-03 NOTE — ED Notes (Signed)
Patient is not ready for discharge.  Patient having I/d performed

## 2018-03-03 NOTE — ED Triage Notes (Signed)
Left axilla abscess, history of the same.  Patient noticed this one 3-4 days ago

## 2018-03-08 ENCOUNTER — Encounter: Payer: Self-pay | Admitting: Cardiology

## 2018-07-12 ENCOUNTER — Other Ambulatory Visit: Payer: Self-pay | Admitting: Family Medicine

## 2018-07-12 DIAGNOSIS — Z1231 Encounter for screening mammogram for malignant neoplasm of breast: Secondary | ICD-10-CM

## 2018-09-02 ENCOUNTER — Ambulatory Visit: Payer: Medicare HMO

## 2018-10-16 ENCOUNTER — Inpatient Hospital Stay (HOSPITAL_COMMUNITY)
Admission: EM | Admit: 2018-10-16 | Discharge: 2018-10-19 | DRG: 690 | Disposition: A | Discharge status: Home / Self Care | Payer: Medicare HMO | Attending: Internal Medicine | Admitting: Internal Medicine

## 2018-10-16 ENCOUNTER — Emergency Department (HOSPITAL_COMMUNITY): Payer: Medicare HMO

## 2018-10-16 ENCOUNTER — Encounter (HOSPITAL_COMMUNITY): Payer: Self-pay | Admitting: Emergency Medicine

## 2018-10-16 ENCOUNTER — Other Ambulatory Visit: Payer: Self-pay

## 2018-10-16 DIAGNOSIS — Z794 Long term (current) use of insulin: Secondary | ICD-10-CM

## 2018-10-16 DIAGNOSIS — E785 Hyperlipidemia, unspecified: Secondary | ICD-10-CM | POA: Diagnosis present

## 2018-10-16 DIAGNOSIS — Z888 Allergy status to other drugs, medicaments and biological substances status: Secondary | ICD-10-CM

## 2018-10-16 DIAGNOSIS — I495 Sick sinus syndrome: Secondary | ICD-10-CM | POA: Diagnosis present

## 2018-10-16 DIAGNOSIS — Z79891 Long term (current) use of opiate analgesic: Secondary | ICD-10-CM

## 2018-10-16 DIAGNOSIS — Z89511 Acquired absence of right leg below knee: Secondary | ICD-10-CM

## 2018-10-16 DIAGNOSIS — K219 Gastro-esophageal reflux disease without esophagitis: Secondary | ICD-10-CM | POA: Diagnosis present

## 2018-10-16 DIAGNOSIS — J449 Chronic obstructive pulmonary disease, unspecified: Secondary | ICD-10-CM | POA: Diagnosis present

## 2018-10-16 DIAGNOSIS — Z820 Family history of epilepsy and other diseases of the nervous system: Secondary | ICD-10-CM

## 2018-10-16 DIAGNOSIS — N183 Chronic kidney disease, stage 3 unspecified: Secondary | ICD-10-CM | POA: Diagnosis present

## 2018-10-16 DIAGNOSIS — Z833 Family history of diabetes mellitus: Secondary | ICD-10-CM

## 2018-10-16 DIAGNOSIS — B9620 Unspecified Escherichia coli [E. coli] as the cause of diseases classified elsewhere: Secondary | ICD-10-CM | POA: Diagnosis present

## 2018-10-16 DIAGNOSIS — Z9981 Dependence on supplemental oxygen: Secondary | ICD-10-CM

## 2018-10-16 DIAGNOSIS — E118 Type 2 diabetes mellitus with unspecified complications: Secondary | ICD-10-CM

## 2018-10-16 DIAGNOSIS — Z7902 Long term (current) use of antithrombotics/antiplatelets: Secondary | ICD-10-CM

## 2018-10-16 DIAGNOSIS — Z6835 Body mass index (BMI) 35.0-35.9, adult: Secondary | ICD-10-CM

## 2018-10-16 DIAGNOSIS — E669 Obesity, unspecified: Secondary | ICD-10-CM | POA: Diagnosis present

## 2018-10-16 DIAGNOSIS — Z8249 Family history of ischemic heart disease and other diseases of the circulatory system: Secondary | ICD-10-CM

## 2018-10-16 DIAGNOSIS — Z91041 Radiographic dye allergy status: Secondary | ICD-10-CM

## 2018-10-16 DIAGNOSIS — F329 Major depressive disorder, single episode, unspecified: Secondary | ICD-10-CM | POA: Diagnosis present

## 2018-10-16 DIAGNOSIS — N1832 Chronic kidney disease, stage 3b: Secondary | ICD-10-CM | POA: Diagnosis present

## 2018-10-16 DIAGNOSIS — I5030 Unspecified diastolic (congestive) heart failure: Secondary | ICD-10-CM | POA: Diagnosis present

## 2018-10-16 DIAGNOSIS — E1165 Type 2 diabetes mellitus with hyperglycemia: Secondary | ICD-10-CM | POA: Diagnosis present

## 2018-10-16 DIAGNOSIS — Z881 Allergy status to other antibiotic agents status: Secondary | ICD-10-CM

## 2018-10-16 DIAGNOSIS — F419 Anxiety disorder, unspecified: Secondary | ICD-10-CM | POA: Diagnosis present

## 2018-10-16 DIAGNOSIS — N3080 Other cystitis without hematuria: Secondary | ICD-10-CM | POA: Diagnosis present

## 2018-10-16 DIAGNOSIS — Z9989 Dependence on other enabling machines and devices: Secondary | ICD-10-CM

## 2018-10-16 DIAGNOSIS — F17210 Nicotine dependence, cigarettes, uncomplicated: Secondary | ICD-10-CM | POA: Diagnosis present

## 2018-10-16 DIAGNOSIS — E1151 Type 2 diabetes mellitus with diabetic peripheral angiopathy without gangrene: Secondary | ICD-10-CM | POA: Diagnosis present

## 2018-10-16 DIAGNOSIS — E1143 Type 2 diabetes mellitus with diabetic autonomic (poly)neuropathy: Secondary | ICD-10-CM | POA: Diagnosis present

## 2018-10-16 DIAGNOSIS — I10 Essential (primary) hypertension: Secondary | ICD-10-CM | POA: Diagnosis present

## 2018-10-16 DIAGNOSIS — Z9689 Presence of other specified functional implants: Secondary | ICD-10-CM

## 2018-10-16 DIAGNOSIS — Z8673 Personal history of transient ischemic attack (TIA), and cerebral infarction without residual deficits: Secondary | ICD-10-CM

## 2018-10-16 DIAGNOSIS — G4733 Obstructive sleep apnea (adult) (pediatric): Secondary | ICD-10-CM

## 2018-10-16 DIAGNOSIS — Z950 Presence of cardiac pacemaker: Secondary | ICD-10-CM

## 2018-10-16 DIAGNOSIS — N3090 Cystitis, unspecified without hematuria: Secondary | ICD-10-CM | POA: Diagnosis present

## 2018-10-16 DIAGNOSIS — I5032 Chronic diastolic (congestive) heart failure: Secondary | ICD-10-CM | POA: Diagnosis present

## 2018-10-16 DIAGNOSIS — E039 Hypothyroidism, unspecified: Secondary | ICD-10-CM | POA: Diagnosis present

## 2018-10-16 DIAGNOSIS — Z9851 Tubal ligation status: Secondary | ICD-10-CM

## 2018-10-16 DIAGNOSIS — Z880 Allergy status to penicillin: Secondary | ICD-10-CM

## 2018-10-16 DIAGNOSIS — Z8614 Personal history of Methicillin resistant Staphylococcus aureus infection: Secondary | ICD-10-CM

## 2018-10-16 DIAGNOSIS — Z8349 Family history of other endocrine, nutritional and metabolic diseases: Secondary | ICD-10-CM

## 2018-10-16 DIAGNOSIS — Z89512 Acquired absence of left leg below knee: Secondary | ICD-10-CM

## 2018-10-16 DIAGNOSIS — I130 Hypertensive heart and chronic kidney disease with heart failure and stage 1 through stage 4 chronic kidney disease, or unspecified chronic kidney disease: Secondary | ICD-10-CM | POA: Diagnosis present

## 2018-10-16 DIAGNOSIS — IMO0002 Reserved for concepts with insufficient information to code with codable children: Secondary | ICD-10-CM | POA: Diagnosis present

## 2018-10-16 DIAGNOSIS — Z79899 Other long term (current) drug therapy: Secondary | ICD-10-CM

## 2018-10-16 DIAGNOSIS — E1122 Type 2 diabetes mellitus with diabetic chronic kidney disease: Secondary | ICD-10-CM | POA: Diagnosis present

## 2018-10-16 LAB — CBC WITH DIFFERENTIAL/PLATELET
Abs Immature Granulocytes: 0.04 10*3/uL (ref 0.00–0.07)
Basophils Absolute: 0 10*3/uL (ref 0.0–0.1)
Basophils Relative: 0 %
EOS PCT: 3 %
Eosinophils Absolute: 0.2 10*3/uL (ref 0.0–0.5)
HCT: 37.4 % (ref 36.0–46.0)
Hemoglobin: 12.1 g/dL (ref 12.0–15.0)
Immature Granulocytes: 1 %
Lymphocytes Relative: 33 %
Lymphs Abs: 2.3 10*3/uL (ref 0.7–4.0)
MCH: 27.9 pg (ref 26.0–34.0)
MCHC: 32.4 g/dL (ref 30.0–36.0)
MCV: 86.2 fL (ref 80.0–100.0)
Monocytes Absolute: 0.6 10*3/uL (ref 0.1–1.0)
Monocytes Relative: 9 %
Neutro Abs: 3.8 10*3/uL (ref 1.7–7.7)
Neutrophils Relative %: 54 %
Platelets: 164 10*3/uL (ref 150–400)
RBC: 4.34 MIL/uL (ref 3.87–5.11)
RDW: 13.3 % (ref 11.5–15.5)
WBC: 6.9 10*3/uL (ref 4.0–10.5)
nRBC: 0 % (ref 0.0–0.2)

## 2018-10-16 LAB — CBG MONITORING, ED
GLUCOSE-CAPILLARY: 429 mg/dL — AB (ref 70–99)
Glucose-Capillary: 547 mg/dL (ref 70–99)
Glucose-Capillary: 431 mg/dL — ABNORMAL HIGH (ref 70–99)
Glucose-Capillary: 402 mg/dL — ABNORMAL HIGH (ref 70–99)
Glucose-Capillary: 368 mg/dL — ABNORMAL HIGH (ref 70–99)
Glucose-Capillary: 403 mg/dL — ABNORMAL HIGH (ref 70–99)
Glucose-Capillary: 410 mg/dL — ABNORMAL HIGH (ref 70–99)
Glucose-Capillary: 361 mg/dL — ABNORMAL HIGH (ref 70–99)

## 2018-10-16 LAB — URINALYSIS, ROUTINE W REFLEX MICROSCOPIC
Bilirubin Urine: NEGATIVE
Ketones, ur: NEGATIVE mg/dL
LEUKOCYTE UA: NEGATIVE
Nitrite: NEGATIVE
Protein, ur: >=300 mg/dL — AB
Specific Gravity, Urine: 1.021 (ref 1.005–1.030)
pH: 5 (ref 5.0–8.0)

## 2018-10-16 LAB — COMPREHENSIVE METABOLIC PANEL
ALT: 22 U/L (ref 0–44)
ALT: 19 U/L (ref 0–44)
AST: 23 U/L (ref 15–41)
AST: 21 U/L (ref 15–41)
Albumin: 3.3 g/dL — ABNORMAL LOW (ref 3.5–5.0)
Albumin: 3 g/dL — ABNORMAL LOW (ref 3.5–5.0)
Alkaline Phosphatase: 143 U/L — ABNORMAL HIGH (ref 38–126)
Alkaline Phosphatase: 119 U/L (ref 38–126)
Anion gap: 10 (ref 5–15)
Anion gap: 8 (ref 5–15)
BUN: 40 mg/dL — ABNORMAL HIGH (ref 6–20)
BUN: 40 mg/dL — ABNORMAL HIGH (ref 6–20)
CALCIUM: 8.9 mg/dL (ref 8.9–10.3)
CHLORIDE: 100 mmol/L (ref 98–111)
CO2: 23 mmol/L (ref 22–32)
CO2: 25 mmol/L (ref 22–32)
CREATININE: 1.32 mg/dL — AB (ref 0.44–1.00)
Calcium: 9.1 mg/dL (ref 8.9–10.3)
Chloride: 103 mmol/L (ref 98–111)
Creatinine, Ser: 1.28 mg/dL — ABNORMAL HIGH (ref 0.44–1.00)
GFR calc Af Amer: 54 mL/min — ABNORMAL LOW (ref >60)
GFR calc Af Amer: 52 mL/min — ABNORMAL LOW (ref >60)
GFR calc non Af Amer: 46 mL/min — ABNORMAL LOW (ref >60)
GFR calc non Af Amer: 45 mL/min — ABNORMAL LOW (ref >60)
Glucose, Bld: 524 mg/dL (ref 70–99)
Glucose, Bld: 440 mg/dL — ABNORMAL HIGH (ref 70–99)
Potassium: 4.1 mmol/L (ref 3.5–5.1)
Potassium: 4.7 mmol/L (ref 3.5–5.1)
Sodium: 133 mmol/L — ABNORMAL LOW (ref 135–145)
Sodium: 136 mmol/L (ref 135–145)
Total Bilirubin: 0.5 mg/dL (ref 0.3–1.2)
Total Bilirubin: 0.5 mg/dL (ref 0.3–1.2)
Total Protein: 6.9 g/dL (ref 6.5–8.1)
Total Protein: 6.6 g/dL (ref 6.5–8.1)

## 2018-10-16 LAB — BLOOD GAS, VENOUS
Acid-Base Excess: 0.4 mmol/L (ref 0.0–2.0)
Bicarbonate: 24.7 mmol/L (ref 20.0–28.0)
O2 Saturation: 81 %
PCO2 VEN: 41.2 mmHg — AB (ref 44.0–60.0)
Patient temperature: 98.9
pH, Ven: 7.397 (ref 7.250–7.430)
pO2, Ven: 47.6 mmHg — ABNORMAL HIGH (ref 32.0–45.0)

## 2018-10-16 LAB — CBC
HCT: 38.5 % (ref 36.0–46.0)
Hemoglobin: 12.5 g/dL (ref 12.0–15.0)
MCH: 27.4 pg (ref 26.0–34.0)
MCHC: 32.5 g/dL (ref 30.0–36.0)
MCV: 84.2 fL (ref 80.0–100.0)
Platelets: 176 10*3/uL (ref 150–400)
RBC: 4.57 MIL/uL (ref 3.87–5.11)
RDW: 13.3 % (ref 11.5–15.5)
WBC: 7.2 10*3/uL (ref 4.0–10.5)
nRBC: 0 % (ref 0.0–0.2)

## 2018-10-16 LAB — HIV ANTIBODY (ROUTINE TESTING W REFLEX): HIV Screen 4th Generation wRfx: NONREACTIVE

## 2018-10-16 LAB — GLUCOSE, CAPILLARY
Glucose-Capillary: 320 mg/dL — ABNORMAL HIGH (ref 70–99)
Glucose-Capillary: 333 mg/dL — ABNORMAL HIGH (ref 70–99)

## 2018-10-16 MED ORDER — FLUOXETINE HCL 10 MG PO CAPS
10.0000 mg | ORAL_CAPSULE | Freq: Every day | ORAL | Status: DC
  Administered 2018-10-16 – 2018-10-19 (×4): 10 mg via ORAL
  Filled 2018-10-16 (×4): qty 1

## 2018-10-16 MED ORDER — DEXTROSE-NACL 5-0.45 % IV SOLN: INTRAVENOUS | Status: DC

## 2018-10-16 MED ORDER — INSULIN GLARGINE 100 UNIT/ML ~~LOC~~ SOLN
120.0000 [IU] | Freq: Every day | SUBCUTANEOUS | Status: DC
  Filled 2018-10-16: qty 1.2

## 2018-10-16 MED ORDER — INSULIN ASPART 100 UNIT/ML ~~LOC~~ SOLN
15.0000 [IU] | Freq: Once | SUBCUTANEOUS | Status: AC
  Administered 2018-10-16: 15 [IU] via SUBCUTANEOUS

## 2018-10-16 MED ORDER — CARVEDILOL 3.125 MG PO TABS
3.1250 mg | ORAL_TABLET | Freq: Two times a day (BID) | ORAL | Status: DC
  Administered 2018-10-16 – 2018-10-19 (×7): 3.125 mg via ORAL
  Filled 2018-10-16 (×8): qty 1

## 2018-10-16 MED ORDER — OXYCODONE HCL 5 MG PO TABS: 5.0000 mg | ORAL_TABLET | Freq: Four times a day (QID) | ORAL | Status: DC | PRN

## 2018-10-16 MED ORDER — OXYBUTYNIN CHLORIDE ER 15 MG PO TB24
15.0000 mg | ORAL_TABLET | Freq: Every day | ORAL | Status: DC
  Administered 2018-10-16 – 2018-10-18 (×3): 15 mg via ORAL
  Filled 2018-10-16 (×3): qty 1

## 2018-10-16 MED ORDER — INSULIN GLARGINE 100 UNIT/ML ~~LOC~~ SOLN: 120.0000 [IU] | Freq: Every day | SUBCUTANEOUS | Status: DC

## 2018-10-16 MED ORDER — SODIUM CHLORIDE 0.9 % IV SOLN
INTRAVENOUS | Status: AC
  Administered 2018-10-16: 15:00:00 via INTRAVENOUS

## 2018-10-16 MED ORDER — ACETAMINOPHEN 325 MG PO TABS: 650.0000 mg | ORAL_TABLET | Freq: Four times a day (QID) | ORAL | Status: DC | PRN

## 2018-10-16 MED ORDER — LEVOFLOXACIN IN D5W 500 MG/100ML IV SOLN
500.0000 mg | INTRAVENOUS | Status: DC
  Administered 2018-10-17 – 2018-10-19 (×3): 500 mg via INTRAVENOUS
  Filled 2018-10-16 (×3): qty 100

## 2018-10-16 MED ORDER — INSULIN GLARGINE 100 UNIT/ML ~~LOC~~ SOLN: 130.0000 [IU] | Freq: Once | SUBCUTANEOUS | Status: DC

## 2018-10-16 MED ORDER — CLOPIDOGREL BISULFATE 75 MG PO TABS
75.0000 mg | ORAL_TABLET | Freq: Every day | ORAL | Status: DC
  Administered 2018-10-16 – 2018-10-19 (×4): 75 mg via ORAL
  Filled 2018-10-16 (×4): qty 1

## 2018-10-16 MED ORDER — OXYCODONE HCL 5 MG PO TABS
5.0000 mg | ORAL_TABLET | Freq: Four times a day (QID) | ORAL | Status: DC | PRN
  Administered 2018-10-16 – 2018-10-17 (×2): 10 mg via ORAL
  Filled 2018-10-16 (×2): qty 2

## 2018-10-16 MED ORDER — OXYBUTYNIN CHLORIDE ER 10 MG PO TB24
10.0000 mg | ORAL_TABLET | Freq: Every day | ORAL | Status: DC
  Filled 2018-10-16: qty 1

## 2018-10-16 MED ORDER — SODIUM CHLORIDE 0.9 % IV BOLUS
1000.0000 mL | Freq: Once | INTRAVENOUS | Status: AC
  Administered 2018-10-16: 1000 mL via INTRAVENOUS

## 2018-10-16 MED ORDER — PANTOPRAZOLE SODIUM 40 MG PO TBEC
40.0000 mg | DELAYED_RELEASE_TABLET | Freq: Every day | ORAL | Status: DC
  Administered 2018-10-16 – 2018-10-19 (×4): 40 mg via ORAL
  Filled 2018-10-16 (×4): qty 1

## 2018-10-16 MED ORDER — FENOFIBRATE 160 MG PO TABS
160.0000 mg | ORAL_TABLET | Freq: Every day | ORAL | Status: DC
  Filled 2018-10-16: qty 1

## 2018-10-16 MED ORDER — MORPHINE SULFATE (PF) 4 MG/ML IV SOLN
4.0000 mg | Freq: Once | INTRAVENOUS | Status: AC
  Administered 2018-10-16: 4 mg via INTRAVENOUS
  Filled 2018-10-16: qty 1

## 2018-10-16 MED ORDER — ONDANSETRON HCL 4 MG/2ML IJ SOLN: 4.0000 mg | Freq: Four times a day (QID) | INTRAMUSCULAR | Status: DC | PRN

## 2018-10-16 MED ORDER — INSULIN ASPART 100 UNIT/ML ~~LOC~~ SOLN
0.0000 [IU] | Freq: Three times a day (TID) | SUBCUTANEOUS | Status: DC
  Administered 2018-10-16: 15 [IU] via SUBCUTANEOUS
  Administered 2018-10-16: 11 [IU] via SUBCUTANEOUS
  Administered 2018-10-16 – 2018-10-17 (×2): 15 [IU] via SUBCUTANEOUS
  Administered 2018-10-17: 11 [IU] via SUBCUTANEOUS
  Administered 2018-10-18: 5 [IU] via SUBCUTANEOUS
  Administered 2018-10-18: 8 [IU] via SUBCUTANEOUS
  Administered 2018-10-18: 11 [IU] via SUBCUTANEOUS
  Administered 2018-10-19: 5 [IU] via SUBCUTANEOUS
  Filled 2018-10-16 (×2): qty 1

## 2018-10-16 MED ORDER — LEVOTHYROXINE SODIUM 50 MCG PO TABS
50.0000 ug | ORAL_TABLET | Freq: Every day | ORAL | Status: DC
  Filled 2018-10-16: qty 1

## 2018-10-16 MED ORDER — INSULIN ASPART 100 UNIT/ML ~~LOC~~ SOLN
18.0000 [IU] | Freq: Three times a day (TID) | SUBCUTANEOUS | Status: DC
  Administered 2018-10-16 (×3): 18 [IU] via SUBCUTANEOUS
  Filled 2018-10-16 (×2): qty 1

## 2018-10-16 MED ORDER — ONDANSETRON HCL 4 MG PO TABS: 4.0000 mg | ORAL_TABLET | Freq: Four times a day (QID) | ORAL | Status: DC | PRN

## 2018-10-16 MED ORDER — METOCLOPRAMIDE HCL 10 MG PO TABS
10.0000 mg | ORAL_TABLET | Freq: Three times a day (TID) | ORAL | Status: DC
  Administered 2018-10-16 – 2018-10-19 (×13): 10 mg via ORAL
  Filled 2018-10-16 (×13): qty 1

## 2018-10-16 MED ORDER — ROSUVASTATIN CALCIUM 40 MG PO TABS
40.0000 mg | ORAL_TABLET | Freq: Every day | ORAL | Status: DC
  Filled 2018-10-16: qty 1

## 2018-10-16 MED ORDER — ONDANSETRON HCL 4 MG/2ML IJ SOLN
4.0000 mg | Freq: Once | INTRAMUSCULAR | Status: AC
  Administered 2018-10-16: 4 mg via INTRAVENOUS
  Filled 2018-10-16: qty 2

## 2018-10-16 MED ORDER — ENOXAPARIN SODIUM 40 MG/0.4ML ~~LOC~~ SOLN
40.0000 mg | SUBCUTANEOUS | Status: DC
  Administered 2018-10-16: 40 mg via SUBCUTANEOUS
  Filled 2018-10-16: qty 0.4

## 2018-10-16 MED ORDER — INSULIN REGULAR(HUMAN) IN NACL 100-0.9 UT/100ML-% IV SOLN: INTRAVENOUS | Status: DC

## 2018-10-16 MED ORDER — INSULIN GLARGINE 100 UNIT/ML ~~LOC~~ SOLN
60.0000 [IU] | Freq: Two times a day (BID) | SUBCUTANEOUS | Status: DC
  Administered 2018-10-16 – 2018-10-17 (×3): 60 [IU] via SUBCUTANEOUS
  Filled 2018-10-16 (×4): qty 0.6

## 2018-10-16 MED ORDER — INSULIN ASPART 100 UNIT/ML ~~LOC~~ SOLN
10.0000 [IU] | Freq: Once | SUBCUTANEOUS | Status: AC
  Administered 2018-10-16: 10 [IU] via INTRAVENOUS
  Filled 2018-10-16: qty 1

## 2018-10-16 MED ORDER — CARVEDILOL 3.125 MG PO TABS
3.1250 mg | ORAL_TABLET | Freq: Two times a day (BID) | ORAL | Status: DC
  Filled 2018-10-16: qty 1

## 2018-10-16 MED ORDER — FENOFIBRATE 160 MG PO TABS
160.0000 mg | ORAL_TABLET | Freq: Every day | ORAL | Status: DC
  Administered 2018-10-16 – 2018-10-19 (×4): 160 mg via ORAL
  Filled 2018-10-16 (×4): qty 1

## 2018-10-16 MED ORDER — LEVOFLOXACIN IN D5W 500 MG/100ML IV SOLN
500.0000 mg | Freq: Once | INTRAVENOUS | Status: AC
  Administered 2018-10-16: 500 mg via INTRAVENOUS
  Filled 2018-10-16: qty 100

## 2018-10-16 MED ORDER — INSULIN GLARGINE 100 UNIT/ML ~~LOC~~ SOLN
130.0000 [IU] | Freq: Once | SUBCUTANEOUS | Status: DC
  Filled 2018-10-16: qty 1.3

## 2018-10-16 MED ORDER — FLUOXETINE HCL 20 MG PO CAPS: 40.0000 mg | ORAL_CAPSULE | Freq: Every day | ORAL | Status: DC

## 2018-10-16 MED ORDER — ACETAMINOPHEN 650 MG RE SUPP: 650.0000 mg | Freq: Four times a day (QID) | RECTAL | Status: DC | PRN

## 2018-10-16 MED ORDER — ENOXAPARIN SODIUM 40 MG/0.4ML ~~LOC~~ SOLN
40.0000 mg | SUBCUTANEOUS | Status: DC
  Administered 2018-10-17 – 2018-10-18 (×2): 40 mg via SUBCUTANEOUS
  Filled 2018-10-16 (×2): qty 0.4

## 2018-10-16 MED ORDER — ROSUVASTATIN CALCIUM 20 MG PO TABS
40.0000 mg | ORAL_TABLET | Freq: Every day | ORAL | Status: DC
  Administered 2018-10-16 – 2018-10-18 (×3): 40 mg via ORAL
  Filled 2018-10-16: qty 1
  Filled 2018-10-16 (×3): qty 2
  Filled 2018-10-16: qty 1

## 2018-10-16 MED ORDER — LEVOTHYROXINE SODIUM 50 MCG PO TABS
50.0000 ug | ORAL_TABLET | Freq: Every day | ORAL | Status: DC
  Administered 2018-10-16 – 2018-10-19 (×4): 50 ug via ORAL
  Filled 2018-10-16 (×3): qty 1

## 2018-10-16 NOTE — H&P (Signed)
History and Physical    Nichole HarriesRobin A Po ZOX:[id number] DOB: [birth year] DOA: 10/16/2018  PCP: Loyal JacobsonKalish, Michael, MD  Patient coming from: Home.  Chief Complaint: Low back pain.  HPI: [name] is a 58 y.o. female with history of diabetes mellitus type 2, TIA, hypertension, hyperlipidemia, chronic kidney disease stage III, sinus node dysfunction status post pacemaker presents to the ER because of worsening back pain over the last 24 hours.  Denies nausea vomiting or diarrhea.  Denies any fever chills.  Denies any dysuria.  Patient had a similar episode 2 weeks ago which resolved without any intervention.  ED Course: In the ER UA shows bacteria and leukocytes but nitrites and leukocyte esterase were negative.  Patient's blood sugar was 524 with anion gap of 10.  Patient states she did take her long-acting insulin last night.  Patient is also on pre-meal insulin coverage.  CT renal study shows features concerning for emphysematous cystitis and was placed on empiric antibiotics and admitted for further management.  For the uncontrolled diabetes patient was given 10 units IV insulin.  Review of Systems: As per HPI, rest all negative.   Past Medical History:  Diagnosis Date  . Anxiety   . Cellulitis   . Chronic diastolic (congestive) heart failure (HCC)   . Complication of anesthesia 01/2013   "didn't know where I was; who I was; talking out the top of my head" (04/21/2013)  . Concussion as a teenager   slight  . COPD (chronic obstructive pulmonary disease) (HCC)    wear oxygen at home  . Depression   . Diabetic neuropathy (HCC)   . GERD (gastroesophageal reflux disease)   . Hyperlipidemia   . Hypertension   . Hypothyroidism    takes Synthroid daily  . MRSA (methicillin resistant staph aureus) culture positive    hx of 2012  . Neuromuscular disorder (HCC)   . OSA on CPAP    and oxygen  . Peripheral vascular disease (HCC)   . Staph infection 2007  . Symptomatic bradycardia  03/04/2014  . TIA (transient ischemic attack)   . Type II diabetes mellitus (HCC)     Past Surgical History:  Procedure Laterality Date  . AMPUTATION Left 06/22/2017   Procedure: LEFT BELOW KNEE AMPUTATION;  Surgeon: Nadara Mustarduda, Marcus V, MD;  [facility];  Service: Orthopedics;  Laterality: Left;  . BLADDER SURGERY  1970's   "stretched the mouth of my bladder" (04/21/2013)  . DILATION AND CURETTAGE OF UTERUS  1980's  . FOOT SURGERY Right 2012   "put pins in one year; amputated toes another OR" (04/21/2013)  . GANGLION CYST EXCISION Left 1990's   "wrist" (04/21/2013)  . I&D EXTREMITY Left 08/25/2013   Procedure: IRRIGATION AND DEBRIDEMENT EXTREMITY;  Surgeon: Axel FillerArmando Ramirez, MD;  Location: [facility] OR;  Service: General;  Laterality: Left;  . INCISION AND DRAINAGE ABSCESS N/A 01/24/2013   Procedure: INCISION AND DRAINAGE ABSCESS;  Surgeon: Clovis Puhomas A. Cornett, MD;  Location: [facility] OR;  Service: General;  Laterality: N/A;  . INGUINAL HERNIA REPAIR N/A 01/28/2013   Procedure: I&D Right Groin Wound;  Surgeon: Shelly Rubensteinouglas A Blackman, MD;  Location: [facility] OR;  Service: General;  Laterality: N/A;  . LEG AMPUTATION BELOW KNEE Right 2012  . PERMANENT PACEMAKER INSERTION N/A 03/05/2014   Biotronik dual chamber pacemaker implanted by Dr Graciela HusbandsKlein for symptomatic bradycardia  . REFRACTIVE SURGERY Bilateral 2014  . STUMP REVISION Right 12/11/2014   Procedure: Right Below Knee Amputation Revision;  Surgeon: Nadara MustardMarcus Duda V,  MD;  Location: [facility] OR;  Service: Orthopedics;  Laterality: Right;  . TOE AMPUTATION Left ?2011    only 2 toes remaning.   . TUBAL LIGATION  1990's  . WRIST SURGERY Right 1980's   "pinched nerve" (04/21/2013)     reports that she has been smoking cigarettes. She has a 16.50 pack-year smoking history. She has never used smokeless tobacco. She reports that she does not drink alcohol or use drugs.  Allergies  Allergen Reactions  . Penicillins Hives    Has patient had a PCN reaction causing immediate rash,  facial/tongue/throat swelling, SOB or lightheadedness with hypotension: No Has patient had a PCN reaction causing severe rash involving mucus membranes or skin necrosis: No Has patient had a PCN reaction that required hospitalization No Has patient had a PCN reaction occurring within the last 10 years: No If all of the above answers are "NO", then may proceed with Cephalosporin use.   . Erythromycin Diarrhea  . Gadolinium Nausea And Vomiting     Code: VOM, Desc: Pt began vomiting immed post infusion of multihance, Onset Date: [id number]   . Iodine-131 Nausea And Vomiting  . Ivp Dye [Iodinated Diagnostic Agents] Nausea And Vomiting       . Metformin Diarrhea    Family History  Problem Relation Age of Onset  . Hypertension Mother   . Hyperlipidemia Mother   . Hypertrophic cardiomyopathy Mother        has pacer  . Alzheimer's disease Father   . Hypertension Brother   . Hyperlipidemia Brother   . Cancer Maternal Aunt   . Diabetes Paternal Aunt   . Diabetes Paternal Uncle   . Diabetes Paternal Grandmother   . Anesthesia problems Neg Hx   . Hypotension Neg Hx   . Malignant hyperthermia Neg Hx   . Pseudochol deficiency Neg Hx     Prior to Admission medications   Medication Sig Start Date End Date Taking? Authorizing Provider  carvedilol (COREG) 3.125 MG tablet Take 1 tablet (3.125 mg total) by mouth 2 (two) times daily with a meal. 10/25/15   Rolly SalterPatel, Pranav M, MD  clopidogrel (PLAVIX) 75 MG tablet Take 1 tablet (75 mg total) by mouth daily. 07/24/16   Ghimire, Werner LeanShanker M, MD  EASY COMFORT LANCETS MISC test blood sugar THREE TIMES DAILY 09/23/16   [provider]  fenofibrate 160 MG tablet Take 1 tablet (160 mg total) by mouth daily. 07/24/16   Ghimire, Werner LeanShanker M, MD  FLUoxetine (PROZAC) 20 MG tablet Take 40 mg by mouth daily.  11/24/15   [provider]  furosemide (LASIX) 40 MG tablet Take 0.5 tablets (20 mg total) daily as needed by mouth. 06/26/17   Leatha GildingGherghe, Costin M, MD    insulin aspart (NOVOLOG) 100 UNIT/ML injection Inject 15 Units 3 (three) times daily with meals into the skin. 06/26/17   Leatha GildingGherghe, Costin M, MD  insulin glargine (LANTUS) 100 UNIT/ML injection Inject 0.35 mLs (35 Units total) 2 (two) times daily into the skin. 06/26/17   Leatha GildingGherghe, Costin M, MD  levothyroxine (SYNTHROID, LEVOTHROID) 25 MCG tablet Take 2 tablets (50 mcg total) by mouth every morning. 10/25/15   Rolly SalterPatel, Pranav M, MD  metoCLOPramide (REGLAN) 10 MG tablet Take 10 mg by mouth 4 (four) times daily. Take 1 tablet (10 mg) by mouth 30 minutes before meals and at bedtime 11/25/15   [provider]  NON FORMULARY Patient does not know names of drugs. Requests a hard copy script    [provider]  oxybutynin (DITROPAN-XL) 5 MG 24 hr tablet Take 10 mg by mouth daily.  11/22/15   [provider]  oxyCODONE (OXY IR/ROXICODONE) 5 MG immediate release tablet Take 1-2 tablets (5-10 mg total) every 6 (six) hours as needed for up to 16 doses by mouth for moderate pain. 06/26/17   Leatha Gilding, MD  rosuvastatin (CRESTOR) 20 MG tablet Take 40 mg by mouth daily.  11/22/15   [provider]  sulfamethoxazole-trimethoprim (BACTRIM DS,SEPTRA DS) 800-160 MG tablet Take 1 tablet by mouth 2 (two) times daily. 03/03/18   Ofilia Neas, PA-C    Physical Exam: Vitals:   10/16/18 0347 10/16/18 0400 10/16/18 0528 10/16/18 0626  BP: (!) 196/74 (!) 156/63 (!) 152/66 (!) 150/65  Pulse: 70 75 71 70  Resp: 15 16 16 16   Temp:      TempSrc:      SpO2: 95% 95% 96% 97%  Weight:      Height:          Constitutional: Moderately built and nourished. Vitals:   10/16/18 0347 10/16/18 0400 10/16/18 0528 10/16/18 0626  BP: (!) 196/74 (!) 156/63 (!) 152/66 (!) 150/65  Pulse: 70 75 71 70  Resp: 15 16 16 16   Temp:      TempSrc:      SpO2: 95% 95% 96% 97%  Weight:      Height:       Eyes: Anicteric no pallor. ENMT: No discharge from the ears eyes nose or mouth. Neck: No mass felt.   No neck rigidity. Respiratory: No rhonchi or crepitations. Cardiovascular: S1-S2 heard. Abdomen: Soft nontender bowel sounds present. Musculoskeletal: Bilateral AKA. Skin: Chronic skin changes. Neurologic: Alert awake oriented to time place and person.  Moves all extremities. Psychiatric: Appears normal.   Labs on Admission: I have personally reviewed following labs and imaging studies  CBC: Recent Labs  Lab 10/16/18 0209  WBC 7.2  HGB 12.5  HCT 38.5  MCV 84.2  PLT 176   Basic Metabolic Panel: Recent Labs  Lab 10/16/18 0209  NA 133*  K 4.1  CL 100  CO2 23  GLUCOSE 524*  BUN 40*  CREATININE 1.28*  CALCIUM 9.1   GFR: Estimated Creatinine Clearance: 56 mL/min (A) (by C-G formula based on SCr of 1.28 mg/dL (H)). Liver Function Tests: Recent Labs  Lab 10/16/18 0209  AST 23  ALT 22  ALKPHOS 143*  BILITOT 0.5  PROT 6.9  ALBUMIN 3.3*   No results for input(s): LIPASE, AMYLASE in the last 168 hours. No results for input(s): AMMONIA in the last 168 hours. Coagulation Profile: No results for input(s): INR, PROTIME in the last 168 hours. Cardiac Enzymes: No results for input(s): CKTOTAL, CKMB, CKMBINDEX, TROPONINI in the last 168 hours. BNP (last 3 results) No results for input(s): PROBNP in the last 8760 hours. HbA1C: No results for input(s): HGBA1C in the last 72 hours. CBG: Recent Labs  Lab 10/16/18 0114 10/16/18 0445 10/16/18 0527  GLUCAP 547* 429* 431*   Lipid Profile: No results for input(s): CHOL, HDL, LDLCALC, TRIG, CHOLHDL, LDLDIRECT in the last 72 hours. Thyroid Function Tests: No results for input(s): TSH, T4TOTAL, FREET4, T3FREE, THYROIDAB in the last 72 hours. Anemia Panel: No results for input(s): VITAMINB12, FOLATE, FERRITIN, TIBC, IRON, RETICCTPCT in the last 72 hours. Urine analysis:    Component Value Date/Time   COLORURINE YELLOW 10/16/2018 0333   APPEARANCEUR HAZY (A) 10/16/2018 0333   LABSPEC 1.021 10/16/2018 0333   PHURINE 5.0  10/16/2018 0333   GLUCOSEU >=500 (A) 10/16/2018 0333   HGBUR MODERATE (A) 10/16/2018 0333   HGBUR negative 03/25/2010 1147   BILIRUBINUR NEGATIVE 10/16/2018 0333   KETONESUR NEGATIVE 10/16/2018 0333   PROTEINUR >=300 (A) 10/16/2018 0333   UROBILINOGEN 0.2 12/07/2014 2125   NITRITE NEGATIVE 10/16/2018 0333   LEUKOCYTESUR NEGATIVE 10/16/2018 0333   Sepsis Labs: (procalcitonin:4,lacticidven:4) )No results found for this or any previous visit (from the past 240 hour(s)).   Radiological Exams on Admission: Ct Renal Stone Study  Result Date: 10/16/2018 CLINICAL DATA:  LEFT flank pain beginning last night, frequent urination. History of diabetes, bilateral lower extremity amputation, bladder surgery. EXAM: CT ABDOMEN AND PELVIS WITHOUT CONTRAST TECHNIQUE: Multidetector CT imaging of the abdomen and pelvis was performed following the standard protocol without IV contrast. COMPARISON:  CT abdomen and pelvis October 14, 2015 FINDINGS: LOWER CHEST: Lung bases are clear. The visualized heart size is normal. Cardiac pacemaker leads. No pericardial effusion. HEPATOBILIARY: Normal. PANCREAS: Normal. SPLEEN: Normal. ADRENALS/URINARY TRACT: Kidneys are orthotopic, demonstrating normal size and morphology. No nephrolithiasis, hydronephrosis; limited assessment for renal masses by nonenhanced CT. The unopacified ureters are normal in course and caliber. Urinary bladder is well distended with non dependent and intraluminal gas. Mild perivesicular fat stranding. STOMACH/BOWEL: The stomach, small and large bowel are normal in course and caliber without inflammatory changes, sensitivity decreased by lack of enteric contrast. Small duodenal diverticulum. Mild colonic diverticulosis. Normal appendix. VASCULAR/LYMPHATIC: Aortoiliac vessels are normal in course and caliber. Mild calcific atherosclerosis. No lymphadenopathy by CT size criteria. REPRODUCTIVE: Normal. OTHER: No intraperitoneal free fluid or free air.  MUSCULOSKELETAL: Non-acute. LEFT thoracic paraspinal sebaceous cyst. IMPRESSION: 1. Emphysematous cystitis. 2. Mild colonic diverticulosis without acute diverticulitis. 3. Acute findings discussed with and reconfirmed by Dr.SHAWN JOY on 10/16/2018 at 3:27 am. Electronically Signed   By: Awilda Metro M.D.   On: 10/16/2018 03:27      Assessment/Plan Principal Problem:   Emphysematous cystitis Active Problems:   Diabetes mellitus type 2, uncontrolled, with complications (HCC)   HTN (hypertension)   Hypothyroidism   Chronic diastolic heart failure (HCC)   OSA on CPAP with oxygen   CKD (chronic kidney disease), stage III (HCC)   Diastolic CHF (HCC)   Cystitis    1. Emphysematous cystitis likely causing back pain on empiric antibiotics follow cultures.  May discuss with urologist in the morning. 2. Uncontrolled diabetes mellitus type 2 with hyperglycemia -reviewed patient's endocrinologist notes.  Has been having difficult to control blood sugar and recently patient's Tresiba dose was increased.  Patient is presently on Lantus 130 units at bedtime with pre-meal insulin coverage of 18 units with moderate dose sliding scale coverage.  Repeat metabolic panel and CBG has been ordered.  Will need aggressive control. 3. Hypertension uncontrolled on Coreg.  We will add PRN IV hydralazine. 4. Sleep apnea on CPAP. 5. Chronic kidney disease stage III creatinine appears to be at baseline. 6. Hypothyroidism on Synthroid dose needs to be verified. 7. History of gastroparesis Reglan dose needs to be verified. 8. Sinus node dysfunction status post pacemaker placement. 9. Bilateral AKA. 10. History of TIA on Plavix and statins. 11. Hyperlipidemia on statins.  Patient's Reglan dose and Synthroid dose needs to be verified.   DVT prophylaxis: Lovenox. Code Status: Full code. Family Communication: Discussed with patient. Disposition Plan: Home.  None Consults called: None. Admission status:  Observation.   Eduard Clos MD [facility] Pager [phone].  If 7PM-7AM, please contact night-coverage [email or web address] Password  [facility]  10/16/2018, 6:34 AM

## 2018-10-16 NOTE — ED Provider Notes (Signed)
[facility]-EMERGENCY DEPT Provider Note   CSN: [id number] Arrival date & time: 10/16/18  0058    History   Chief Complaint Chief Complaint  Patient presents with  . Flank Pain  . Hyperglycemia    HPI [name] is a 58 y.o. female.     HPI  [name] is a 58 y.o. female, with a history of diastolic CHF, anxiety, COPD, poorly controlled diabetes, HTN, TIA, presenting to the ED with left midback pain beginning last evening (2/25). Pain is sharp, intermittent, 7/10, nonradiating. She has had hematuria for the last 3-4 week which is being assessed by her PCP.  Urinary frequency for last 24 hours.  Blood glucose usually 300-400. Last insulin was 38 units Novolog and 3 units of "a long acting insulin, I don't remember what it's called, but it's not Lantus," around 9 AM 2/25. LMP 2-3 years ago. Denies fever/chills, abdominal pain, SOB, cough, N/V/D, dysuria, falls/trauma, numbness, weakness, extremity pain or swelling, or any other complaints.      Past Medical History:  Diagnosis Date  . Anxiety   . Cellulitis   . Chronic diastolic (congestive) heart failure (HCC)   . Complication of anesthesia 01/2013   "didn't know where I was; who I was; talking out the top of my head" (04/21/2013)  . Concussion as a teenager   slight  . COPD (chronic obstructive pulmonary disease) (HCC)    wear oxygen at home  . Depression   . Diabetic neuropathy (HCC)   . GERD (gastroesophageal reflux disease)   . Hyperlipidemia   . Hypertension   . Hypothyroidism    takes Synthroid daily  . MRSA (methicillin resistant staph aureus) culture positive    hx of 2012  . Neuromuscular disorder (HCC)   . OSA on CPAP    and oxygen  . Peripheral vascular disease (HCC)   . Staph infection 2007  . Symptomatic bradycardia 03/04/2014  . TIA (transient ischemic attack)   . Type II diabetes mellitus [employer])     Patient Active Problem List   Diagnosis Date Noted  .  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 History of left below knee amputation (HCC) 07/16/2017  . Abnormal urinalysis 06/18/2017  . Diabetic gastroparesis (HCC) 06/18/2017  . History of TIA (transient ischemic attack) 06/18/2017  . Hyperlipidemia 04/10/2017  . Acquired absence of right leg below knee (HCC) 11/29/2016  . Left facial numbness   . Cerebrovascular disease   . Diabetic polyneuropathy associated with type 2 diabetes mellitus (HCC)   . Chronic nonintractable headache   . GERD (gastroesophageal reflux disease) 07/21/2016  . TIA (transient ischemic attack) 07/04/2016  . Hyperkalemia 12/07/2015  . Cellulitis in diabetic foot (HCC) 12/05/2015  . Herpes simplex labialis   . Altered mental status   . Thrush 10/20/2015  . HSV-1 (herpes simplex virus 1) infection   . Hallucinations   . Acute respiratory failure with hypoxia (HCC) 10/14/2015  . Hyperglycemia 10/13/2015  . Diabetes mellitus due to underlying condition with hyperosmolarity without nonketotic hyperglycemic-hyperosmolar coma [facility]) (HCC) 10/13/2015  . CKD (chronic kidney disease), stage III (HCC) 10/13/2015  . AKI (acute kidney injury) (HCC) 10/13/2015  . Diastolic CHF (HCC) 10/13/2015  . Acute encephalopathy 10/13/2015  . 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 Lactic acid acidosis   . Diabetic foot ulcer (HCC) 12/08/2014  . Symptomatic bradycardia 03/04/2014  . OSA on CPAP with oxygen 03/04/2014  . COPD (chronic obstructive pulmonary disease), on home O2 03/04/2014  .  Bradycardia 03/04/2014  . Cellulitis 08/16/2013  . Chronic diastolic heart failure (HCC) 08/16/2013  . Sepsis (HCC) 08/16/2013  . SOB (shortness of breath) 04/20/2013  . Proteinuria 01/26/2013  . Acute on chronic kidney failure (HCC) 01/24/2013  . Abscess of groin, right 01/23/2013  . HTN (hypertension) 01/23/2013  . Leukocytosis 01/23/2013  . Anemia of chronic disease 01/23/2013  . Hypothyroidism 01/23/2013  . Hyponatremia  10/06/2011  . HYPERCHOLESTEROLEMIA 08/17/2010  . Depression 02/01/2010  . TOBACCO ABUSE 06/04/2009  . Diabetes mellitus type 2, uncontrolled, with complications (HCC) 08/06/2007    Past Surgical History:  Procedure Laterality Date  . AMPUTATION Left 06/22/2017   Procedure: LEFT BELOW KNEE AMPUTATION;  Surgeon: Nadara Mustarduda, Marcus V, MD;  [facility];  Service: Orthopedics;  Laterality: Left;  . BLADDER SURGERY  1970's   "stretched the mouth of my bladder" (04/21/2013)  . DILATION AND CURETTAGE OF UTERUS  1980's  . FOOT SURGERY Right 2012   "put pins in one year; amputated toes another OR" (04/21/2013)  . GANGLION CYST EXCISION Left 1990's   "wrist" (04/21/2013)  . I&D EXTREMITY Left 08/25/2013   Procedure: IRRIGATION AND DEBRIDEMENT EXTREMITY;  Surgeon: Axel FillerArmando Ramirez, MD;  Location: [facility] OR;  Service: General;  Laterality: Left;  . INCISION AND DRAINAGE ABSCESS N/A 01/24/2013   Procedure: INCISION AND DRAINAGE ABSCESS;  Surgeon: Clovis Puhomas A. Cornett, MD;  Location: [facility] OR;  Service: General;  Laterality: N/A;  . INGUINAL HERNIA REPAIR N/A 01/28/2013   Procedure: I&D Right Groin Wound;  Surgeon: Shelly Rubensteinouglas A Blackman, MD;  Location: [facility] OR;  Service: General;  Laterality: N/A;  . LEG AMPUTATION BELOW KNEE Right 2012  . PERMANENT PACEMAKER INSERTION N/A 03/05/2014   Biotronik dual chamber pacemaker implanted by Dr Graciela HusbandsKlein for symptomatic bradycardia  . REFRACTIVE SURGERY Bilateral 2014  . STUMP REVISION Right 12/11/2014   Procedure: Right Below Knee Amputation Revision;  Surgeon: Nadara MustardMarcus Duda V, MD;  [facility];  Service: Orthopedics;  Laterality: Right;  . TOE AMPUTATION Left ?2011    only 2 toes remaning.   . TUBAL LIGATION  1990's  . WRIST SURGERY Right 1980's   "pinched nerve" (04/21/2013)     OB History   No obstetric history on file.      Home Medications    Prior to Admission medications   Medication Sig Start Date End Date Taking? Authorizing Provider  carvedilol (COREG) 3.125 MG tablet  Take 1 tablet (3.125 mg total) by mouth 2 (two) times daily with a meal. 10/25/15   Rolly SalterPatel, Pranav M, MD  clopidogrel (PLAVIX) 75 MG tablet Take 1 tablet (75 mg total) by mouth daily. 07/24/16   Ghimire, Werner LeanShanker M, MD  EASY COMFORT LANCETS MISC test blood sugar THREE TIMES DAILY 09/23/16   [provider]  fenofibrate 160 MG tablet Take 1 tablet (160 mg total) by mouth daily. 07/24/16   Ghimire, Werner LeanShanker M, MD  FLUoxetine (PROZAC) 20 MG tablet Take 40 mg by mouth daily.  11/24/15   [provider]  furosemide (LASIX) 40 MG tablet Take 0.5 tablets (20 mg total) daily as needed by mouth. 06/26/17   Leatha GildingGherghe, Costin M, MD  insulin aspart (NOVOLOG) 100 UNIT/ML injection Inject 15 Units 3 (three) times daily with meals into the skin. 06/26/17   Leatha GildingGherghe, Costin M, MD  insulin glargine (LANTUS) 100 UNIT/ML injection Inject 0.35 mLs (35 Units total) 2 (two) times daily into the skin. 06/26/17   Leatha GildingGherghe, Costin M, MD  levothyroxine (SYNTHROID, LEVOTHROID) 25 MCG tablet  Take 2 tablets (50 mcg total) by mouth every morning. 10/25/15   Rolly Salter, MD  metoCLOPramide (REGLAN) 10 MG tablet Take 10 mg by mouth 4 (four) times daily. Take 1 tablet (10 mg) by mouth 30 minutes before meals and at bedtime 11/25/15   [provider]  NON FORMULARY Patient does not know names of drugs. Requests a hard copy script    [provider]  oxybutynin (DITROPAN-XL) 5 MG 24 hr tablet Take 10 mg by mouth daily.  11/22/15   [provider]  oxyCODONE (OXY IR/ROXICODONE) 5 MG immediate release tablet Take 1-2 tablets (5-10 mg total) every 6 (six) hours as needed for up to 16 doses by mouth for moderate pain. 06/26/17   Leatha Gilding, MD  rosuvastatin (CRESTOR) 20 MG tablet Take 40 mg by mouth daily.  11/22/15   [provider]  sulfamethoxazole-trimethoprim (BACTRIM DS,SEPTRA DS) 800-160 MG tablet Take 1 tablet by mouth 2 (two) times daily. 03/03/18   Ofilia Neas, PA-C    Family  History Family History  Problem Relation Age of Onset  . Hypertension Mother   . Hyperlipidemia Mother   . Hypertrophic cardiomyopathy Mother        has pacer  . Alzheimer's disease Father   . Hypertension Brother   . Hyperlipidemia Brother   . Cancer Maternal Aunt   . Diabetes Paternal Aunt   . Diabetes Paternal Uncle   . Diabetes Paternal Grandmother   . Anesthesia problems Neg Hx   . Hypotension Neg Hx   . Malignant hyperthermia Neg Hx   . Pseudochol deficiency Neg Hx     Social History Social History   Tobacco Use  . Smoking status: Current Every Day Smoker    Packs/day: 0.50    Years: 33.00    Pack years: 16.50    Types: Cigarettes  . Smokeless tobacco: Never Used  Substance Use Topics  . Alcohol use: No  . Drug use: No     Allergies   Penicillins; Erythromycin; Gadolinium; Iodine-131; Ivp dye [iodinated diagnostic agents]; and Metformin   Review of Systems Review of Systems  Constitutional: Negative for fever.  Respiratory: Negative for shortness of breath.   Cardiovascular: Negative for chest pain.  Gastrointestinal: Negative for abdominal pain, diarrhea, nausea and vomiting.  Genitourinary: Positive for hematuria. Negative for vaginal bleeding and vaginal discharge.  Musculoskeletal: Positive for back pain.  Neurological: Negative for weakness and numbness.  All other systems reviewed and are negative.    Physical Exam Updated Vital Signs BP (!) 182/90 (BP Location: Right Arm)   Pulse 90   Temp 98.9 F (37.2 C) (Oral)   Resp 18   SpO2 100%   Physical Exam Vitals signs and nursing note reviewed.  Constitutional:      General: She is not in acute distress.    Appearance: She is well-developed. She is not diaphoretic.  HENT:     Head: Normocephalic and atraumatic.     Mouth/Throat:     Mouth: Mucous membranes are moist.     Pharynx: Oropharynx is clear.  Eyes:     Conjunctiva/sclera: Conjunctivae normal.  Neck:     Musculoskeletal: Neck  supple.  Cardiovascular:     Rate and Rhythm: Normal rate and regular rhythm.     Heart sounds: Normal heart sounds.  Pulmonary:     Effort: Pulmonary effort is normal. No respiratory distress.     Breath sounds: Normal breath sounds.  Abdominal:  Palpations: Abdomen is soft.     Tenderness: There is no abdominal tenderness. There is no right CVA tenderness, left CVA tenderness or guarding.  Musculoskeletal:     Right lower leg: No edema.     Left lower leg: No edema.     Comments: No noted tenderness in the back or flank on exam. No color change, swelling, lesions.  Feet:     Right foot:     Amputation: Right leg is amputated below knee.     Left foot:     Amputation: Left leg is amputated below knee.  Lymphadenopathy:     Cervical: No cervical adenopathy.  Skin:    General: Skin is warm and dry.  Neurological:     Mental Status: She is alert.  Psychiatric:        Mood and Affect: Mood and affect normal.        Speech: Speech normal.        Behavior: Behavior normal.      ED Treatments / Results  Labs (all labs ordered are listed, but only abnormal results are displayed) Labs Reviewed  URINALYSIS, ROUTINE W REFLEX MICROSCOPIC - Abnormal; Notable for the following components:      Result Value   APPearance HAZY (*)    Glucose, UA >=500 (*)    Hgb urine dipstick MODERATE (*)    Protein, ur >=300 (*)    Bacteria, UA MANY (*)    All other components within normal limits  COMPREHENSIVE METABOLIC PANEL - Abnormal; Notable for the following components:   Sodium 133 (*)    Glucose, Bld 524 (*)    BUN 40 (*)    Creatinine, Ser 1.28 (*)    Albumin 3.3 (*)    Alkaline Phosphatase 143 (*)    GFR calc non Af Amer 46 (*)    GFR calc Af Amer 54 (*)    All other components within normal limits  BLOOD GAS, VENOUS - Abnormal; Notable for the following components:   pCO2, Ven 41.2 (*)    pO2, Ven 47.6 (*)    All other components within normal limits  CBG MONITORING, ED -  Abnormal; Notable for the following components:   Glucose-Capillary 547 (*)    All other components within normal limits  CBG MONITORING, ED - Abnormal; Notable for the following components:   Glucose-Capillary 429 (*)    All other components within normal limits  CBG MONITORING, ED - Abnormal; Notable for the following components:   Glucose-Capillary 431 (*)    All other components within normal limits  URINE CULTURE  CBC  I-STAT BETA HCG BLOOD, ED ([facility], [facility], [facility] ONLY)    EKG None  Radiology Ct Renal Stone Study  Result Date: 10/16/2018 CLINICAL DATA:  LEFT flank pain beginning last night, frequent urination. History of diabetes, bilateral lower extremity amputation, bladder surgery. EXAM: CT ABDOMEN AND PELVIS WITHOUT CONTRAST TECHNIQUE: Multidetector CT imaging of the abdomen and pelvis was performed following the standard protocol without IV contrast. COMPARISON:  CT abdomen and pelvis October 14, 2015 FINDINGS: LOWER CHEST: Lung bases are clear. The visualized heart size is normal. Cardiac pacemaker leads. No pericardial effusion. HEPATOBILIARY: Normal. PANCREAS: Normal. SPLEEN: Normal. ADRENALS/URINARY TRACT: Kidneys are orthotopic, demonstrating normal size and morphology. No nephrolithiasis, hydronephrosis; limited assessment for renal masses by nonenhanced CT. The unopacified ureters are normal in course and caliber. Urinary bladder is well distended with non dependent and intraluminal gas. Mild perivesicular fat stranding. STOMACH/BOWEL: The stomach, small  and large bowel are normal in course and caliber without inflammatory changes, sensitivity decreased by lack of enteric contrast. Small duodenal diverticulum. Mild colonic diverticulosis. Normal appendix. VASCULAR/LYMPHATIC: Aortoiliac vessels are normal in course and caliber. Mild calcific atherosclerosis. No lymphadenopathy by CT size criteria. REPRODUCTIVE: Normal. OTHER: No intraperitoneal free fluid or free air. MUSCULOSKELETAL:  Non-acute. LEFT thoracic paraspinal sebaceous cyst. IMPRESSION: 1. Emphysematous cystitis. 2. Mild colonic diverticulosis without acute diverticulitis. 3. Acute findings discussed with and reconfirmed by Dr.SHAWN JOY on 10/16/2018 at 3:27 am. Electronically Signed   By: Awilda Metroourtnay  Bloomer M.D.   On: 10/16/2018 03:27    Procedures Procedures (including critical care time)  Medications Ordered in ED Medications  insulin regular, human (MYXREDLIN) 100 units/ 100 mL infusion (0 Units/hr Intravenous Hold 10/16/18 0359)  dextrose 5 %-0.45 % sodium chloride infusion ( Intravenous Hold 10/16/18 0349)  insulin glargine (LANTUS) injection 130 Units (has no administration in time range)  sodium chloride 0.9 % bolus 1,000 mL ( Intravenous Stopped 10/16/18 0443)  morphine 4 MG/ML injection 4 mg (4 mg Intravenous Given 10/16/18 0242)  ondansetron (ZOFRAN) injection 4 mg (4 mg Intravenous Given 10/16/18 0240)  insulin aspart (novoLOG) injection 10 Units (10 Units Intravenous Given 10/16/18 0406)  levofloxacin (LEVAQUIN) IVPB 500 mg ( Intravenous Stopped 10/16/18 0539)     Initial Impression / Assessment and Plan / ED Course  I have reviewed the triage vital signs and the nursing notes.  Pertinent labs & imaging results that were available during my care of the patient were reviewed by me and considered in my medical decision making (see chart for details).  Clinical Course as of Oct 16 608  Wed Oct 16, 2018  0447 Spoke with Dr. Toniann FailKakrakandy, hospitalist. Agrees to admit the patient.   [SJ]  [phone] Spoke with JC, pharmacist. States we can substitute unit for unit patient's home Tresiba 130 units daily for 130 units of Lantus.   [SJ]  [phone] RN states patient is now telling him her last dose of Evaristo Buryresiba was 8 pm 2/25. She had previously told me her last dose was 9 AM 2/25.  For this reason, Lantus dosing was changed to be administered at 8 PM tonight.   [SJ]    Clinical Course User Index [SJ] Joy, Shawn C, PA-C        Patient presents with left lower back pain along with urinary frequency.  Many bacteria and 21-50 WBCs on UA.  Evidence of emphysematous cystitis on CT.  Thankfully, she is afebrile, not tachycardic, not tachypneic, and not hypotensive.  Patient also with hyperglycemia with blood glucose of 524 without acidosis or elevated anion gap. She will be admitted for IV antibiotics.  Findings and plan of care discussed with Devoria AlbeIva Knapp, MD.   Vitals:   10/16/18 0300 10/16/18 0347 10/16/18 0400 10/16/18 0528  BP: (!) 142/56 (!) 196/74 (!) 156/63 (!) 152/66  Pulse: 68 70 75 71  Resp: 11 15 16 16   Temp:      TempSrc:      SpO2: 92% 95% 95% 96%  Weight:      Height:         Final Clinical Impressions(s) / ED Diagnoses   Final diagnoses:  Emphysematous cystitis    ED Discharge Orders    None       Concepcion LivingJoy, Shawn C, PA-C 10/16/18 [phone]    Devoria AlbeKnapp, Iva, MD 10/16/18 [phone]

## 2018-10-16 NOTE — ED Notes (Signed)
Patient transported to CT 

## 2018-10-16 NOTE — ED Triage Notes (Signed)
Pt arrives via EMS for left flank pain since 1900 last night. Patient states frequent urination. Sugar reading HI on EMS glucometer. Sugar normally 300.

## 2018-10-16 NOTE — Progress Notes (Signed)
PROGRESS NOTE  Brief Narrative: [name] is a 58 y.o. female with a history of poorly controlled IDT2DM, gastroparesis, stage III CKD, PAD s/p bilateral BKA, TIA, HTN, HLD, and SSS s/p PPM who presented to the ED for left flank/abdominal pain for 24 hours. She denied any fever, chills, N/V/D, hematuria. She was afebrile with WBC 7.2k and benign exam. CT renal stone study revealed a distended bladder with perivesicular fat stranding and emphysematous cystitis. Glucose noted to be 524 without acidosis or anion gap. Levaquin was started and the patient was admitted this morning.   Subjective: Initially denied dysuria, but is noticing this as of this morning. Does have sensation of incomplete bladder emptying at times. Otherwise feels ok. States blood sugars are erratic at home.  Objective: BP (!) 150/61 (BP Location: Left Arm)   Pulse 68   Temp 97.7 F (36.5 C) (Oral)   Resp 16   Ht 5\' 5"  (1.651 m)   Wt 97.5 kg   SpO2 95%   BMI 35.77 kg/m   Gen: Chronically ill-appearing female in no distress Pulm: Clear and nonlabored on room air  CV: RRR, no murmur, no JVD  GI: Soft, NT, ND, +BS  Ext: Bilateral BKA Neuro: Alert and oriented. No focal deficits. Skin: No rashes, lesions or other ulcers  Assessment & Plan: Emphysematous cystitis in the setting of poorly controlled (acutely and chronically) diabetes:  - Continue empiric antibiotic and monitor cultures.  - I discussed this case with urology, Dr. Laverle Patter, who reviewed the CT and recommended continuing abx toward typical pathogens. ?if she's developing diabetic cystopathy leading to poor bladder emptying, predisposing to this and other UTI. Would benefit from follow up in the outpatient setting with urology.  - Check PVR and insert foley if >300cc  IDT2DM: Severely hyperglycemic on presentation, though not in DKA. Pt has a hard time remembering the names of her medications. Dosing verified by pharmacy: Evaristo Bury 120u daily + novolog 18u  + moderate SSI at home.  - Will give lantus 60u early now, and continue with 60u BID dosing. Also continue novolog as above.  Otherwise, continue treatment per H&P from this morning by Dr. Toniann Fail.  Tyrone Nine, MD Pager [phone] 10/16/2018, 2:25 PM

## 2018-10-16 NOTE — Progress Notes (Addendum)
ED TO INPATIENT HANDOFF REPORT  Name/Age/Gender [name] 58 y.o. female  Code Status    Code Status Orders  (From admission, onward)         Start     Ordered   10/16/18 0633  Full code  Continuous     10/16/18 0633        Code Status History    Date Active Date Inactive Code Status Order ID Comments User Context   06/18/2017 1605 06/26/2017 2014 Full Code [id number]  Waldemar Dickens, MD ED   07/21/2016 1953 07/23/2016 1937 Full Code [id number]  Ivor Costa, MD ED   07/04/2016 1709 07/06/2016 2210 Full Code [id number]  Arrien, Jimmy Picket, MD Inpatient   12/05/2015 0127 12/07/2015 1525 Full Code [id number]  Danford, Suann Larry, MD Inpatient   10/13/2015 0829 10/25/2015 1821 Full Code [id number]  Waldemar Dickens, MD ED   12/11/2014 1726 12/15/2014 0001 Full Code [id number]  Newt Minion, MD Inpatient   12/08/2014 0306 12/11/2014 1726 Full Code [id number]  Francesca Oman, DO Inpatient   03/05/2014 1717 03/07/2014 1519 Full Code [id number]  Deboraha Sprang, MD Inpatient   03/04/2014 2217 03/05/2014 1717 Full Code [id number]  Isaiah Serge, NP Inpatient   08/16/2013 0819 08/28/2013 1734 Full Code [id number]  Charlynne Cousins, MD Inpatient   08/16/2013 0333 08/16/2013 0819 Full Code [id number]  Theressa Millard, MD ED   04/20/2013 2113 04/22/2013 1854 Full Code [id number]  Rise Patience, MD Inpatient   01/23/2013 2253 02/07/2013 1653 Full Code [id number]  Robbie Lis, MD ED   12/05/2012 0841 12/07/2012 1650 Full Code [id number]  Reyne Dumas, MD Inpatient   10/07/2011 0646 10/09/2011 1905 Full Code [id number]  Andy Gauss, RN Inpatient   10/07/2011 0030 10/07/2011 0646 Full Code [id number]  Cleaster Corin, RN ED      Home/SNF/Other Home  Chief Complaint Flank Pain;Hyperglycemia  Level of Care/Admitting Diagnosis ED Disposition    ED Disposition Condition Comment   Admit  Hospital Area: [facility] [[id number]]  Level of Care: Telemetry [5]  Admit to tele  based on following criteria: Monitor for Ischemic changes  Diagnosis: Cystitis [449675]  Admitting Physician: Rise Patience [phone]  Attending Physician: Rise Patience [phone]  PT Class (Do Not Modify): Observation [104]  PT Acc Code (Do Not Modify): Observation [10022]       Medical History Past Medical History:  Diagnosis Date  . Anxiety   . Cellulitis   . Chronic diastolic (congestive) heart failure ([place])   . Complication of anesthesia 01/2013   "didn't know where I was; who I was; talking out the top of my head" (04/21/2013)  . Concussion as a teenager   slight  . COPD (chronic obstructive pulmonary disease) ([facility])    wear oxygen at home  . Depression   . Diabetic neuropathy ([place])   . GERD (gastroesophageal reflux disease)   . Hyperlipidemia   . Hypertension   . Hypothyroidism    takes Synthroid daily  . MRSA (methicillin resistant staph aureus) culture positive    hx of 2012  . Neuromuscular disorder ([place])   . OSA on CPAP    and oxygen  . Peripheral vascular disease ([place])   . Staph infection 2007  . Symptomatic bradycardia 03/04/2014  . TIA (transient ischemic attack)   . Type II diabetes mellitus (H[facility])     Allergies Allergies  Allergen Reactions  . Penicillins Hives    Has patient  had a PCN reaction causing immediate rash, facial/tongue/throat swelling, SOB or lightheadedness with hypotension: No Has patient had a PCN reaction causing severe rash involving mucus membranes or skin necrosis: No Has patient had a PCN reaction that required hospitalization No Has patient had a PCN reaction occurring within the last 10 years: No If all of the above answers are "NO", then may proceed with Cephalosporin use.   . Erythromycin Diarrhea  . Gadolinium Nausea And Vomiting     Code: VOM, Desc: Pt began vomiting immed post infusion of multihance, Onset Date: [id number]   . Iodine-131 Nausea And Vomiting  . Ivp Dye [Iodinated Diagnostic Agents] Nausea And Vomiting        . Metformin Diarrhea    IV Location/Drains/Wounds Patient Lines/Drains/Airways Status   Active Line/Drains/Airways    Name:   Placement date:   Placement time:   Site:   Days:   Peripheral IV 10/16/18 Right Forearm   10/16/18    0209    Forearm   less than 1   Incision (Closed) 06/22/17 Leg Left   06/22/17    0725     481   Wound / Incision (Open or Dehisced) 07/04/16 Diabetic ulcer Foot Left circular, raised, approximately 3 cm in diameter   07/04/16    2000    Foot   834   Wound / Incision (Open or Dehisced) 07/21/16 Diabetic ulcer Foot Left;Posterior;Anterior medium size mass underfoot bulging out; necrotic tissue surrounding the wound.    07/21/16    2102    Foot   817          Labs/Imaging Results for orders placed or performed during the hospital encounter of 10/16/18 (from the past 48 hour(s))  CBG monitoring, ED     Status: Abnormal   Collection Time: 10/16/18  1:14 AM  Result Value Ref Range   Glucose-Capillary 547 (HH) 70 - 99 mg/dL   Comment 1 Notify RN   CBC     Status: None   Collection Time: 10/16/18  2:09 AM  Result Value Ref Range   WBC 7.2 4.0 - 10.5 K/uL   RBC 4.57 3.87 - 5.11 MIL/uL   Hemoglobin 12.5 12.0 - 15.0 g/dL   HCT 38.5 36.0 - 46.0 %   MCV 84.2 80.0 - 100.0 fL   MCH 27.4 26.0 - 34.0 pg   MCHC 32.5 30.0 - 36.0 g/dL   RDW 13.3 11.5 - 15.5 %   Platelets 176 150 - 400 K/uL   nRBC 0.0 0.0 - 0.2 %    Comment: Performed at [facility], [place] [address], [place], [place] [address]  Comprehensive metabolic panel     Status: Abnormal   Collection Time: 10/16/18  2:09 AM  Result Value Ref Range   Sodium 133 (L) 135 - 145 mmol/L   Potassium 4.1 3.5 - 5.1 mmol/L   Chloride 100 98 - 111 mmol/L   CO2 23 22 - 32 mmol/L   Glucose, Bld 524 (HH) 70 - 99 mg/dL    Comment: CRITICAL RESULT CALLED TO, READ BACK BY AND VERIFIED WITH: Lonzo Cloud RN [id number] 10/16/18 A NAVARRO    BUN 40 (H) 6 - 20 mg/dL   Creatinine, Ser 1.28 (H) 0.44 - 1.00 mg/dL    Calcium 9.1 8.9 - 10.3 mg/dL   Total Protein 6.9 6.5 - 8.1 g/dL   Albumin 3.3 (L) 3.5 - 5.0 g/dL   AST 23 15 - 41 U/L   ALT 22 0 -  44 U/L   Alkaline Phosphatase 143 (H) 38 - 126 U/L   Total Bilirubin 0.5 0.3 - 1.2 mg/dL   GFR calc non Af Amer 46 (L) >60 mL/min   GFR calc Af Amer 54 (L) >60 mL/min   Anion gap 10 5 - 15    Comment: Performed at [facility], [place] [address], [place], [place] [address]  Blood gas, venous     Status: Abnormal   Collection Time: 10/16/18  2:40 AM  Result Value Ref Range   O2 Content ROOM AIR L/min   Delivery systems ROOM AIR    pH, Ven 7.397 7.250 - 7.430   pCO2, Ven 41.2 (L) 44.0 - 60.0 mmHg   pO2, Ven 47.6 (H) 32.0 - 45.0 mmHg   Bicarbonate 24.7 20.0 - 28.0 mmol/L   Acid-Base Excess 0.4 0.0 - 2.0 mmol/L   O2 Saturation 81.0 %   Patient temperature 98.9    Collection site VEIN    Drawn by COLLECTED BY NURSE    Sample type VENOUS     Comment: Performed at [place] [address], [place], [place] [address]  Urinalysis, Routine w reflex microscopic     Status: Abnormal   Collection Time: 10/16/18  3:33 AM  Result Value Ref Range   Color, Urine YELLOW YELLOW   APPearance HAZY (A) CLEAR   Specific Gravity, Urine 1.021 1.005 - 1.030   pH 5.0 5.0 - 8.0   Glucose, UA >=500 (A) NEGATIVE mg/dL   Hgb urine dipstick MODERATE (A) NEGATIVE   Bilirubin Urine NEGATIVE NEGATIVE   Ketones, ur NEGATIVE NEGATIVE mg/dL   Protein, ur >=300 (A) NEGATIVE mg/dL   Nitrite NEGATIVE NEGATIVE   Leukocytes,Ua NEGATIVE NEGATIVE   RBC / HPF 0-5 0 - 5 RBC/hpf   WBC, UA 21-50 0 - 5 WBC/hpf   Bacteria, UA MANY (A) NONE SEEN   Squamous Epithelial / LPF 0-5 0 - 5   Mucus PRESENT     Comment: Performed at [facility], [place] [address] [place], [place] [address]  CBG monitoring, ED     Status: Abnormal   Collection Time: 10/16/18  4:45 AM  Result Value Ref Range   Glucose-Capillary 429 (H) 70 - 99 mg/dL  POC  CBG, ED     Status: Abnormal   Collection Time: 10/16/18  5:27 AM  Result Value Ref Range   Glucose-Capillary 431 (H) 70 - 99 mg/dL  CBG monitoring, ED     Status: Abnormal   Collection Time: 10/16/18  6:59 AM  Result Value Ref Range   Glucose-Capillary 402 (H) 70 - 99 mg/dL  CBG monitoring, ED     Status: Abnormal   Collection Time: 10/16/18  8:50 AM  Result Value Ref Range   Glucose-Capillary 368 (H) 70 - 99 mg/dL  Comprehensive metabolic panel     Status: Abnormal   Collection Time: 10/16/18  8:52 AM  Result Value Ref Range   Sodium 136 135 - 145 mmol/L   Potassium 4.7 3.5 - 5.1 mmol/L   Chloride 103 98 - 111 mmol/L   CO2 25 22 - 32 mmol/L   Glucose, Bld 440 (H) 70 - 99 mg/dL   BUN 40 (H) 6 - 20 mg/dL   Creatinine, Ser 1.32 (H) 0.44 - 1.00 mg/dL   Calcium 8.9 8.9 - 10.3 mg/dL   Total Protein 6.6 6.5 - 8.1 g/dL   Albumin 3.0 (L) 3.5 - 5.0 g/dL   AST 21  15 - 41 U/L   ALT 19 0 - 44 U/L   Alkaline Phosphatase 119 38 - 126 U/L   Total Bilirubin 0.5 0.3 - 1.2 mg/dL   GFR calc non Af Amer 45 (L) >60 mL/min   GFR calc Af Amer 52 (L) >60 mL/min   Anion gap 8 5 - 15    Comment: Performed at [facility], [place] [address] [place], [place] [address]  CBC WITH DIFFERENTIAL     Status: None   Collection Time: 10/16/18  8:52 AM  Result Value Ref Range   WBC 6.9 4.0 - 10.5 K/uL   RBC 4.34 3.87 - 5.11 MIL/uL   Hemoglobin 12.1 12.0 - 15.0 g/dL   HCT 37.4 36.0 - 46.0 %   MCV 86.2 80.0 - 100.0 fL   MCH 27.9 26.0 - 34.0 pg   MCHC 32.4 30.0 - 36.0 g/dL   RDW 13.3 11.5 - 15.5 %   Platelets 164 150 - 400 K/uL   nRBC 0.0 0.0 - 0.2 %   Neutrophils Relative % 54 %   Neutro Abs 3.8 1.7 - 7.7 K/uL   Lymphocytes Relative 33 %   Lymphs Abs 2.3 0.7 - 4.0 K/uL   Monocytes Relative 9 %   Monocytes Absolute 0.6 0.1 - 1.0 K/uL   Eosinophils Relative 3 %   Eosinophils Absolute 0.2 0.0 - 0.5 K/uL   Basophils Relative 0 %   Basophils Absolute 0.0 0.0 - 0.1 K/uL   Immature  Granulocytes 1 %   Abs Immature Granulocytes 0.04 0.00 - 0.07 K/uL    Comment: Performed at [facility], [place] [address], [place],  [address]  CBG monitoring, ED     Status: Abnormal   Collection Time: 10/16/18  9:06 AM  Result Value Ref Range   Glucose-Capillary 403 (H) 70 - 99 mg/dL  CBG monitoring, ED     Status: Abnormal   Collection Time: 10/16/18 10:38 AM  Result Value Ref Range   Glucose-Capillary 410 (H) 70 - 99 mg/dL  CBG monitoring, ED     Status: Abnormal   Collection Time: 10/16/18 12:20 PM  Result Value Ref Range   Glucose-Capillary 361 (H) 70 - 99 mg/dL   Ct Renal Stone Study  Result Date: 10/16/2018 CLINICAL DATA:  LEFT flank pain beginning last night, frequent urination. History of diabetes, bilateral lower extremity amputation, bladder surgery. EXAM: CT ABDOMEN AND PELVIS WITHOUT CONTRAST TECHNIQUE: Multidetector CT imaging of the abdomen and pelvis was performed following the standard protocol without IV contrast. COMPARISON:  CT abdomen and pelvis October 14, 2015 FINDINGS: LOWER CHEST: Lung bases are clear. The visualized heart size is normal. Cardiac pacemaker leads. No pericardial effusion. HEPATOBILIARY: Normal. PANCREAS: Normal. SPLEEN: Normal. ADRENALS/URINARY TRACT: Kidneys are orthotopic, demonstrating normal size and morphology. No nephrolithiasis, hydronephrosis; limited assessment for renal masses by nonenhanced CT. The unopacified ureters are normal in course and caliber. Urinary bladder is well distended with non dependent and intraluminal gas. Mild perivesicular fat stranding. STOMACH/BOWEL: The stomach, small and large bowel are normal in course and caliber without inflammatory changes, sensitivity decreased by lack of enteric contrast. Small duodenal diverticulum. Mild colonic diverticulosis. Normal appendix. VASCULAR/LYMPHATIC: Aortoiliac vessels are normal in course and caliber. Mild calcific atherosclerosis. No lymphadenopathy by CT  size criteria. REPRODUCTIVE: Normal. OTHER: No intraperitoneal free fluid or free air. MUSCULOSKELETAL: Non-acute. LEFT thoracic paraspinal sebaceous cyst. IMPRESSION: 1. Emphysematous cystitis. 2. Mild colonic diverticulosis without acute diverticulitis. 3. Acute findings discussed with  and reconfirmed by Dr.SHAWN JOY on 10/16/2018 at 3:27 am. Electronically Signed   By: Elon Alas M.D.   On: 10/16/2018 03:27    Pending Labs Unresulted Labs (From admission, onward)    Start     Ordered   10/23/18 0500  Creatinine, serum  (enoxaparin (LOVENOX)    CrCl >/= 30 ml/min)  Weekly,   R    Comments:  while on enoxaparin therapy    10/16/18 4665   10/16/18 9935  HIV antibody (Routine Testing)  Once,   R     10/16/18 7017   10/16/18 7939  CBC  (enoxaparin (LOVENOX)    CrCl >/= 30 ml/min)  Once,   R    Comments:  Baseline for enoxaparin therapy IF NOT ALREADY DRAWN.  Notify MD if PLT < 100 K.    10/16/18 0633   10/16/18 0414  Urine culture  ONCE - STAT,   STAT     10/16/18 0413          Vitals/Pain Today's Vitals   10/16/18 0918 10/16/18 1130 10/16/18 1131 10/16/18 1200  BP: (!) 154/54 137/60 137/60   Pulse: 68 68 65 72  Resp: 16  20   Temp:      TempSrc:      SpO2: 94% 95% 98% 94%  Weight:      Height:      PainSc:        Isolation Precautions No active isolations  Medications Medications  insulin aspart (novoLOG) injection 18 Units (18 Units Subcutaneous Given 10/16/18 0912)  metoCLOPramide (REGLAN) tablet 10 mg (10 mg Oral Given 10/16/18 0817)  clopidogrel (PLAVIX) tablet 75 mg (75 mg Oral Given 10/16/18 1001)  acetaminophen (TYLENOL) tablet 650 mg (has no administration in time range)    Or  acetaminophen (TYLENOL) suppository 650 mg (has no administration in time range)  ondansetron (ZOFRAN) tablet 4 mg (has no administration in time range)    Or  ondansetron (ZOFRAN) injection 4 mg (has no administration in time range)  insulin aspart (novoLOG) injection 0-15 Units  (15 Units Subcutaneous Given 10/16/18 1050)  enoxaparin (LOVENOX) injection 40 mg (has no administration in time range)  0.9 %  sodium chloride infusion (has no administration in time range)  levofloxacin (LEVAQUIN) IVPB 500 mg (has no administration in time range)  oxyCODONE (Oxy IR/ROXICODONE) immediate release tablet 5-10 mg (has no administration in time range)  carvedilol (COREG) tablet 3.125 mg (3.125 mg Oral Given 10/16/18 1002)  rosuvastatin (CRESTOR) tablet 40 mg (has no administration in time range)  fenofibrate tablet 160 mg (160 mg Oral Given 10/16/18 1001)  FLUoxetine (PROZAC) capsule 10 mg (10 mg Oral Given 10/16/18 1001)  oxybutynin (DITROPAN XL) 24 hr tablet 15 mg (has no administration in time range)  pantoprazole (PROTONIX) EC tablet 40 mg (40 mg Oral Given 10/16/18 1001)  levothyroxine (SYNTHROID, LEVOTHROID) tablet 50 mcg (50 mcg Oral Given 10/16/18 1000)  insulin glargine (LANTUS) injection 120 Units (has no administration in time range)  sodium chloride 0.9 % bolus 1,000 mL ( Intravenous Stopped 10/16/18 0443)  morphine 4 MG/ML injection 4 mg (4 mg Intravenous Given 10/16/18 0242)  ondansetron (ZOFRAN) injection 4 mg (4 mg Intravenous Given 10/16/18 0240)  insulin aspart (novoLOG) injection 10 Units (10 Units Intravenous Given 10/16/18 0406)  levofloxacin (LEVAQUIN) IVPB 500 mg ( Intravenous Stopped 10/16/18 0539)  insulin aspart (novoLOG) injection 15 Units (15 Units Subcutaneous Given 10/16/18 0815)    Mobility Wheelchair

## 2018-10-16 NOTE — ED Notes (Signed)
Unsuccessful IV attempts x 2 by this RN.  Charge RN to attempt.

## 2018-10-16 NOTE — Progress Notes (Signed)
Pharmacy Antibiotic Note  [name] is a 58 y.o. female admitted on 10/16/2018 with UTI.  Pharmacy has been consulted for levofloxacin dosing.  Plan: Levofloxacin 500mg  iv q24hr  Height: 5\' 5"  (165.1 cm) Weight: 215 lb (97.5 kg) IBW/kg (Calculated) : 57  Temp (24hrs), Avg:98.9 F (37.2 C), Min:98.9 F (37.2 C), Max:98.9 F (37.2 C)  Recent Labs  Lab 10/16/18 0209  WBC 7.2  CREATININE 1.28*    Estimated Creatinine Clearance: 56 mL/min (A) (by C-G formula based on SCr of 1.28 mg/dL (H)).    Allergies  Allergen Reactions  . Penicillins Hives    Has patient had a PCN reaction causing immediate rash, facial/tongue/throat swelling, SOB or lightheadedness with hypotension: No Has patient had a PCN reaction causing severe rash involving mucus membranes or skin necrosis: No Has patient had a PCN reaction that required hospitalization No Has patient had a PCN reaction occurring within the last 10 years: No If all of the above answers are "NO", then may proceed with Cephalosporin use.   . Erythromycin Diarrhea  . Gadolinium Nausea And Vomiting     Code: VOM, Desc: Pt began vomiting immed post infusion of multihance, Onset Date: [id number]   . Iodine-131 Nausea And Vomiting  . Ivp Dye [Iodinated Diagnostic Agents] Nausea And Vomiting       . Metformin Diarrhea    Antimicrobials this admission: Levofloxacin 10/16/2018 >>  Dose adjustments this admission: -  Microbiology results: -*  Thank you for allowing pharmacy to be a part of this patient's care.  Aleene Davidson Crowford 10/16/2018 6:55 AM

## 2018-10-16 NOTE — Progress Notes (Signed)
A consult was received from an ED physician for levofloxacin per pharmacy dosing.  The patient's profile has been reviewed for ht/wt/allergies/indication/available labs.   A one time order has been placed for Levofloxacin 500mg  iv x1.  Further antibiotics/pharmacy consults should be ordered by admitting physician if indicated.                       Thank you, Aleene Davidson Crowford 10/16/2018  4:31 AM

## 2018-10-16 NOTE — ED Notes (Signed)
[name] in lab to add on UC.

## 2018-10-17 DIAGNOSIS — Z950 Presence of cardiac pacemaker: Secondary | ICD-10-CM | POA: Diagnosis not present

## 2018-10-17 DIAGNOSIS — B9620 Unspecified Escherichia coli [E. coli] as the cause of diseases classified elsewhere: Secondary | ICD-10-CM | POA: Diagnosis present

## 2018-10-17 DIAGNOSIS — Z8614 Personal history of Methicillin resistant Staphylococcus aureus infection: Secondary | ICD-10-CM | POA: Diagnosis not present

## 2018-10-17 DIAGNOSIS — Z888 Allergy status to other drugs, medicaments and biological substances status: Secondary | ICD-10-CM | POA: Diagnosis not present

## 2018-10-17 DIAGNOSIS — Z9981 Dependence on supplemental oxygen: Secondary | ICD-10-CM | POA: Diagnosis not present

## 2018-10-17 DIAGNOSIS — E785 Hyperlipidemia, unspecified: Secondary | ICD-10-CM | POA: Diagnosis present

## 2018-10-17 DIAGNOSIS — Z91041 Radiographic dye allergy status: Secondary | ICD-10-CM | POA: Diagnosis not present

## 2018-10-17 DIAGNOSIS — Z8673 Personal history of transient ischemic attack (TIA), and cerebral infarction without residual deficits: Secondary | ICD-10-CM | POA: Diagnosis not present

## 2018-10-17 DIAGNOSIS — Z880 Allergy status to penicillin: Secondary | ICD-10-CM | POA: Diagnosis not present

## 2018-10-17 DIAGNOSIS — E1143 Type 2 diabetes mellitus with diabetic autonomic (poly)neuropathy: Secondary | ICD-10-CM | POA: Diagnosis present

## 2018-10-17 DIAGNOSIS — K219 Gastro-esophageal reflux disease without esophagitis: Secondary | ICD-10-CM | POA: Diagnosis present

## 2018-10-17 DIAGNOSIS — I495 Sick sinus syndrome: Secondary | ICD-10-CM | POA: Diagnosis present

## 2018-10-17 DIAGNOSIS — F329 Major depressive disorder, single episode, unspecified: Secondary | ICD-10-CM | POA: Diagnosis present

## 2018-10-17 DIAGNOSIS — N183 Chronic kidney disease, stage 3 (moderate): Secondary | ICD-10-CM | POA: Diagnosis present

## 2018-10-17 DIAGNOSIS — E1151 Type 2 diabetes mellitus with diabetic peripheral angiopathy without gangrene: Secondary | ICD-10-CM | POA: Diagnosis present

## 2018-10-17 DIAGNOSIS — E1122 Type 2 diabetes mellitus with diabetic chronic kidney disease: Secondary | ICD-10-CM | POA: Diagnosis present

## 2018-10-17 DIAGNOSIS — I130 Hypertensive heart and chronic kidney disease with heart failure and stage 1 through stage 4 chronic kidney disease, or unspecified chronic kidney disease: Secondary | ICD-10-CM | POA: Diagnosis present

## 2018-10-17 DIAGNOSIS — I5032 Chronic diastolic (congestive) heart failure: Secondary | ICD-10-CM | POA: Diagnosis present

## 2018-10-17 DIAGNOSIS — J449 Chronic obstructive pulmonary disease, unspecified: Secondary | ICD-10-CM | POA: Diagnosis present

## 2018-10-17 DIAGNOSIS — Z881 Allergy status to other antibiotic agents status: Secondary | ICD-10-CM | POA: Diagnosis not present

## 2018-10-17 DIAGNOSIS — N3080 Other cystitis without hematuria: Secondary | ICD-10-CM | POA: Diagnosis not present

## 2018-10-17 DIAGNOSIS — E1165 Type 2 diabetes mellitus with hyperglycemia: Secondary | ICD-10-CM | POA: Diagnosis present

## 2018-10-17 DIAGNOSIS — G4733 Obstructive sleep apnea (adult) (pediatric): Secondary | ICD-10-CM | POA: Diagnosis present

## 2018-10-17 DIAGNOSIS — E039 Hypothyroidism, unspecified: Secondary | ICD-10-CM | POA: Diagnosis present

## 2018-10-17 LAB — BASIC METABOLIC PANEL
Anion gap: 8 (ref 5–15)
BUN: 46 mg/dL — ABNORMAL HIGH (ref 6–20)
CO2: 23 mmol/L (ref 22–32)
Calcium: 9 mg/dL (ref 8.9–10.3)
Chloride: 104 mmol/L (ref 98–111)
Creatinine, Ser: 1.32 mg/dL — ABNORMAL HIGH (ref 0.44–1.00)
GFR calc Af Amer: 52 mL/min — ABNORMAL LOW (ref >60)
GFR calc non Af Amer: 45 mL/min — ABNORMAL LOW (ref >60)
Glucose, Bld: 420 mg/dL — ABNORMAL HIGH (ref 70–99)
Potassium: 4.6 mmol/L (ref 3.5–5.1)
SODIUM: 135 mmol/L (ref 135–145)

## 2018-10-17 LAB — GLUCOSE, CAPILLARY
Glucose-Capillary: 402 mg/dL — ABNORMAL HIGH (ref 70–99)
Glucose-Capillary: 378 mg/dL — ABNORMAL HIGH (ref 70–99)
Glucose-Capillary: 375 mg/dL — ABNORMAL HIGH (ref 70–99)
Glucose-Capillary: 312 mg/dL — ABNORMAL HIGH (ref 70–99)
Glucose-Capillary: 247 mg/dL — ABNORMAL HIGH (ref 70–99)

## 2018-10-17 MED ORDER — INSULIN ASPART 100 UNIT/ML ~~LOC~~ SOLN
15.0000 [IU] | Freq: Once | SUBCUTANEOUS | Status: AC
  Administered 2018-10-17: 15 [IU] via SUBCUTANEOUS

## 2018-10-17 MED ORDER — INSULIN ASPART 100 UNIT/ML ~~LOC~~ SOLN
10.0000 [IU] | Freq: Once | SUBCUTANEOUS | Status: AC
  Administered 2018-10-17: 10 [IU] via INTRAVENOUS
  Filled 2018-10-17: qty 0.1

## 2018-10-17 MED ORDER — INSULIN ASPART 100 UNIT/ML ~~LOC~~ SOLN
25.0000 [IU] | Freq: Three times a day (TID) | SUBCUTANEOUS | Status: DC
  Administered 2018-10-17 – 2018-10-18 (×3): 25 [IU] via SUBCUTANEOUS

## 2018-10-17 MED ORDER — INSULIN GLARGINE 100 UNIT/ML ~~LOC~~ SOLN
70.0000 [IU] | Freq: Two times a day (BID) | SUBCUTANEOUS | Status: DC
  Administered 2018-10-17 – 2018-10-18 (×2): 70 [IU] via SUBCUTANEOUS
  Filled 2018-10-17 (×2): qty 0.7

## 2018-10-17 MED ORDER — INSULIN GLARGINE 100 UNIT/ML ~~LOC~~ SOLN
5.0000 [IU] | Freq: Once | SUBCUTANEOUS | Status: AC
  Administered 2018-10-17: 5 [IU] via SUBCUTANEOUS
  Filled 2018-10-17: qty 0.05

## 2018-10-17 NOTE — Progress Notes (Addendum)
Inpatient Diabetes Program Recommendations  AACE/ADA: New Consensus Statement on Inpatient Glycemic Control (2015)  Target Ranges:  Prepandial:   less than 140 mg/dL      Peak postprandial:   less than 180 mg/dL (1-2 hours)      Critically ill patients:  140 - 180 mg/dL   Lab Results  Component Value Date   GLUCAP 378 (H) 10/17/2018   HGBA1C >15.5 (H) 06/18/2017    Review of Glycemic Control  Diabetes history: DM2 Outpatient Diabetes medications: Tresiba 120 units + Novolog 15 tid meal coverage Current orders for Inpatient glycemic control: Lantus 60 units bid + Novolog 15 units tid meal coverage + Novolog moderate correction tid  Inpatient Diabetes Program Recommendations:   -A1c to determine prehospital glycemic control -Increase Lantus to 70 units bid -Increase Novolog meal coverage to 25 units tid if eats 50% Noted patient was prescribed U-500 in the past in 2018. Will plan to speak with patient.  Thank you, Billy Fischer. Jarelyn Bambach, RN, MSN, CDE  Diabetes Coordinator Inpatient Glycemic Control Team Team Pager [phone] (8am-5pm) 10/17/2018 8:27 AM

## 2018-10-17 NOTE — Care Management Obs Status (Signed)
MEDICARE OBSERVATION STATUS NOTIFICATION   Patient Details  Name: [name] MRN: [id number] Date of Birth: 07/01/1961   Medicare Observation Status Notification Given:  Yes    Geni Bers, RN 10/17/2018, 2:25 PM

## 2018-10-17 NOTE — Progress Notes (Signed)
PROGRESS NOTE    [name]  KGM:[id number] DOB: [birth year] DOA: 10/16/2018 PCP: Loyal JacobsonKalish, Michael, MD   Brief Narrative: Patient is a 58 year old female with history of poorly controlled type diabetes mellitus, gastroparesis, stage III CKD, peripheral artery disease status post bilateral BKA, TIA, hypertension, hyperlipidemia, sick sinus syndrome and status post pacemaker placement who presented to the emergency room with complaints of left flank/abdominal pain for 24 hours.  CT imaging showed distended urinary bladder with perivesicular fat stranding and emphysematous cystitis.  Urology was consulted but recommended to follow-up as an outpatient.  Urine culture has been sent.  Started on IV antibiotics.  Blood sugars still uncontrolled this morning.  Diabetic coordinator following.  Assessment & Plan:   Principal Problem:   Emphysematous cystitis Active Problems:   Diabetes mellitus type 2, uncontrolled, with complications (HCC)   HTN (hypertension)   Hypothyroidism   Chronic diastolic heart failure (HCC)   OSA on CPAP with oxygen   CKD (chronic kidney disease), stage III (HCC)   Diastolic CHF (HCC)   Cystitis   Emphysematous cystitis: High suspicion for urinary tract infection and possible sepsis.  We will follow-up urine and blood culture report.  Started on IV Levaquin.  We will continue IV antibiotics for today.  Was unable to take oral antibiotic on presentation because of continuous left flank pain. Discussed with urology earlier who reviewed the CT scan and recommended continue IV antibiotics.  She will follow-up with urology as an outpatient, Dr. Laverle PatterBorden  Uncontrolled type 2 diabetes mellitus: Blood sugars in the range of 400s this morning.  Diabetic coordinator  following.  Dose of insulin increased.We will continue monitor blood sugars.  Hypertension: Continue Coreg and as needed hydralazine.  Sleep apnea: Continue CPAP  CKD stage III: Currently kidney function is at  baseline.  Continue to monitor  Hypothyroidism: Continue Synthyroid  History of gastroparesis: On Reglan at home.  Sick sinus syndrome: Status post pacemaker placement.  Peripheral artery disease: Status post bilateral BKA  History of TIA: On Plavix and statin at home.  Increased risks  urinary tract infection /sepsis on this poorly controlled diabetic lady.  Was unable to take antibiotic orally by mouth on admission.  We will continue IV antibiotics for now.  Also needs inpatient stay for extremely uncontrolled diabetes mellitus.         DVT prophylaxis: Lovenox Code Status: Full Family Communication: None present at the bedside Disposition Plan: Home in 1 to 2 days   Consultants: None  Procedures: None  Antimicrobials:  Anti-infectives (From admission, onward)   Start     Dose/Rate Route Frequency Ordered Stop   10/17/18 0600  levofloxacin (LEVAQUIN) IVPB 500 mg     500 mg 100 mL/hr over 60 Minutes Intravenous Every 24 hours 10/16/18 0649     10/16/18 0445  levofloxacin (LEVAQUIN) IVPB 500 mg     500 mg 100 mL/hr over 60 Minutes Intravenous  Once 10/16/18 0431 10/16/18 0539      Subjective: Patient seen and examined the bedside this morning.  Feels slightly better today.  Denies any abdomen pain today.  Uncontrolled blood sugars this morning.  Objective: Vitals:   10/17/18 0500 10/17/18 0527 10/17/18 0822 10/17/18 1228  BP:  (!) 146/54 (!) 161/63 (!) 152/65  Pulse:  68 71 70  Resp:  16  13  Temp:  98.7 F (37.1 C) 98.3 F (36.8 C) 98.9 F (37.2 C)  TempSrc:  Oral Oral Oral  SpO2:  92% 95% 95%  Weight: 101.2 kg     Height:        Intake/Output Summary (Last 24 hours) at 10/17/2018 1405 Last data filed at 10/17/2018 1300 Gross per 24 hour  Intake 1540 ml  Output 600 ml  Net 940 ml   Filed Weights   10/16/18 0253 10/16/18 1333 10/17/18 0500  Weight: 97.5 kg 97.5 kg 101.2 kg    Examination:  General exam: Not in distress,obese HEENT:PERRL,Oral  mucosa moist, Ear/Nose normal on gross exam Respiratory system: Bilateral equal air entry, normal vesicular breath sounds, no wheezes or crackles  Cardiovascular system: S1 & S2 heard, RRR. No JVD, murmurs, rubs, gallops or clicks. No pedal edema. Gastrointestinal system: Abdomen is nondistended, soft and nontender. No organomegaly or masses felt. Normal bowel sounds heard. Central nervous system: Alert and oriented. No focal neurological deficits. Extremities: B/L BKA Skin: No rashes, lesions or ulcers,no icterus ,no pallor   Data Reviewed: I have personally reviewed following labs and imaging studies  CBC: Recent Labs  Lab 10/16/18 0209 10/16/18 0852  WBC 7.2 6.9  NEUTROABS  --  3.8  HGB 12.5 12.1  HCT 38.5 37.4  MCV 84.2 86.2  PLT 176 164   Basic Metabolic Panel: Recent Labs  Lab 10/16/18 0209 10/16/18 0852 10/17/18 0349  NA 133* 136 135  K 4.1 4.7 4.6  CL 100 103 104  CO2 23 25 23   GLUCOSE 524* 440* 420*  BUN 40* 40* 46*  CREATININE 1.28* 1.32* 1.32*  CALCIUM 9.1 8.9 9.0   GFR: Estimated Creatinine Clearance: 55.5 mL/min (A) (by C-G formula based on SCr of 1.32 mg/dL (H)). Liver Function Tests: Recent Labs  Lab 10/16/18 0209 10/16/18 0852  AST 23 21  ALT 22 19  ALKPHOS 143* 119  BILITOT 0.5 0.5  PROT 6.9 6.6  ALBUMIN 3.3* 3.0*   No results for input(s): LIPASE, AMYLASE in the last 168 hours. No results for input(s): AMMONIA in the last 168 hours. Coagulation Profile: No results for input(s): INR, PROTIME in the last 168 hours. Cardiac Enzymes: No results for input(s): CKTOTAL, CKMB, CKMBINDEX, TROPONINI in the last 168 hours. BNP (last 3 results) No results for input(s): PROBNP in the last 8760 hours. HbA1C: No results for input(s): HGBA1C in the last 72 hours. CBG: Recent Labs  Lab 10/16/18 1631 10/16/18 2115 10/17/18 0544 10/17/18 0815 10/17/18 1208  GLUCAP 320* 333* 402* 378* 375*   Lipid Profile: No results for input(s): CHOL, HDL,  LDLCALC, TRIG, CHOLHDL, LDLDIRECT in the last 72 hours. Thyroid Function Tests: No results for input(s): TSH, T4TOTAL, FREET4, T3FREE, THYROIDAB in the last 72 hours. Anemia Panel: No results for input(s): VITAMINB12, FOLATE, FERRITIN, TIBC, IRON, RETICCTPCT in the last 72 hours. Sepsis Labs: No results for input(s): PROCALCITON, LATICACIDVEN in the last 168 hours.  Recent Results (from the past 240 hour(s))  Urine culture     Status: None (Preliminary result)   Collection Time: 10/16/18  3:33 AM  Result Value Ref Range Status   Specimen Description   Final    URINE, CLEAN CATCH Performed at [facility], [address], [place], [place] [address]    Special Requests   Final    NONE Performed at [facility], [address] [place], [place] [address]    Culture   Final    CULTURE REINCUBATED FOR BETTER GROWTH Performed at [facility], [address] [place], [place] [address]    Report Status PENDING  Incomplete  Radiology Studies: Ct Renal Stone Study  Result Date: 10/16/2018 CLINICAL DATA:  LEFT flank pain beginning last night, frequent urination. History of diabetes, bilateral lower extremity amputation, bladder surgery. EXAM: CT ABDOMEN AND PELVIS WITHOUT CONTRAST TECHNIQUE: Multidetector CT imaging of the abdomen and pelvis was performed following the standard protocol without IV contrast. COMPARISON:  CT abdomen and pelvis October 14, 2015 FINDINGS: LOWER CHEST: Lung bases are clear. The visualized heart size is normal. Cardiac pacemaker leads. No pericardial effusion. HEPATOBILIARY: Normal. PANCREAS: Normal. SPLEEN: Normal. ADRENALS/URINARY TRACT: Kidneys are orthotopic, demonstrating normal size and morphology. No nephrolithiasis, hydronephrosis; limited assessment for renal masses by nonenhanced CT. The unopacified ureters are normal in course and caliber. Urinary bladder is well distended with non dependent and  intraluminal gas. Mild perivesicular fat stranding. STOMACH/BOWEL: The stomach, small and large bowel are normal in course and caliber without inflammatory changes, sensitivity decreased by lack of enteric contrast. Small duodenal diverticulum. Mild colonic diverticulosis. Normal appendix. VASCULAR/LYMPHATIC: Aortoiliac vessels are normal in course and caliber. Mild calcific atherosclerosis. No lymphadenopathy by CT size criteria. REPRODUCTIVE: Normal. OTHER: No intraperitoneal free fluid or free air. MUSCULOSKELETAL: Non-acute. LEFT thoracic paraspinal sebaceous cyst. IMPRESSION: 1. Emphysematous cystitis. 2. Mild colonic diverticulosis without acute diverticulitis. 3. Acute findings discussed with and reconfirmed by Dr.SHAWN JOY on 10/16/2018 at 3:27 am. Electronically Signed   By: Awilda Metro M.D.   On: 10/16/2018 03:27        Scheduled Meds: . carvedilol  3.125 mg Oral BID WC  . clopidogrel  75 mg Oral Daily  . enoxaparin (LOVENOX) injection  40 mg Subcutaneous Q24H  . fenofibrate  160 mg Oral Daily  . FLUoxetine  10 mg Oral Daily  . insulin aspart  0-15 Units Subcutaneous TID WC  . insulin aspart  25 Units Subcutaneous TID WC  . insulin glargine  70 Units Subcutaneous BID  . levothyroxine  50 mcg Oral QAC breakfast  . metoCLOPramide  10 mg Oral TID WC & HS  . oxybutynin  15 mg Oral QHS  . pantoprazole  40 mg Oral Daily  . rosuvastatin  40 mg Oral q1800   Continuous Infusions: . levofloxacin (LEVAQUIN) IV 500 mg (10/17/18 0539)     LOS: 0 days    Time spent:35 mins. More than 50% of that time was spent in counseling and/or coordination of care.      Burnadette Pop, MD [facility] Pager [phone]  If 7PM-7AM, please contact night-coverage [email or web address] Password [email or web address] 10/17/2018, 2:05 PM

## 2018-10-17 NOTE — Care Management Note (Signed)
Case Management Note  Patient Details  Name: [name] MRN: [id number] Date of Birth: April 20, 1961  Subjective/Objective:                    Action/Plan:Pt states that she has PCS for 2 hours everyday to come to her home.    Expected Discharge Date:  (unknown)               Expected Discharge Plan:  Home/Self Care  In-House Referral:     Discharge planning Services  CM Consult  Post Acute Care Choice:    Choice offered to:     DME Arranged:    DME Agency:     HH Arranged:    HH Agency:     Status of Service:  In process, will continue to follow  If discussed at Long Length of Stay Meetings, dates discussed:    Additional CommentsGeni Bers, RN 10/17/2018, 2:29 PM

## 2018-10-18 LAB — GLUCOSE, CAPILLARY
Glucose-Capillary: 345 mg/dL — ABNORMAL HIGH (ref 70–99)
Glucose-Capillary: 293 mg/dL — ABNORMAL HIGH (ref 70–99)
Glucose-Capillary: 229 mg/dL — ABNORMAL HIGH (ref 70–99)
Glucose-Capillary: 231 mg/dL — ABNORMAL HIGH (ref 70–99)

## 2018-10-18 LAB — HEMOGLOBIN A1C
Hgb A1c MFr Bld: 14.9 % — ABNORMAL HIGH (ref 4.8–5.6)
Mean Plasma Glucose: 381 mg/dL

## 2018-10-18 MED ORDER — INSULIN ASPART 100 UNIT/ML ~~LOC~~ SOLN
30.0000 [IU] | Freq: Three times a day (TID) | SUBCUTANEOUS | Status: DC
  Administered 2018-10-18 – 2018-10-19 (×3): 30 [IU] via SUBCUTANEOUS

## 2018-10-18 MED ORDER — INSULIN GLARGINE 100 UNIT/ML ~~LOC~~ SOLN
80.0000 [IU] | Freq: Two times a day (BID) | SUBCUTANEOUS | Status: DC
  Administered 2018-10-18 – 2018-10-19 (×2): 80 [IU] via SUBCUTANEOUS
  Filled 2018-10-18 (×4): qty 0.8

## 2018-10-18 NOTE — Progress Notes (Addendum)
Inpatient Diabetes Program Recommendations  AACE/ADA: New Consensus Statement on Inpatient Glycemic Control (2015)  Target Ranges:  Prepandial:   less than 140 mg/dL      Peak postprandial:   less than 180 mg/dL (1-2 hours)      Critically ill patients:  140 - 180 mg/dL   Lab Results  Component Value Date   GLUCAP 345 (H) 10/18/2018   HGBA1C 14.9 (H) 10/17/2018    Review of Glycemic Control  Diabetes history: DM2 Outpatient Diabetes medications: Tresiba 120 units + Novolog 15 tid meal coverage Current orders for Inpatient glycemic control: Lantus 70 units bid + Novolog 25 units tid meal coverage + Novolog moderate correction tid  Inpatient Diabetes Program Recommendations:   Increase Lantus to 80 units bid Increase Novolog meal coverage to 30 units tid Add Novolog hs correction 0-5 units  Recommend patient to return to her physician post D/C to return to U-500 regimen she was on in the past. Spoke with patient concerning past hx of U-500 and patient was not able to recall when she was on and if she was on U-500. Spoke with pt @ bedside about  A1C results 14.9 (average blood glucose of 381 over the past 2-3 months) and explained what an A1C is, basic pathophysiology of DM Type 2, basic home care, basic diabetes diet nutrition principles, importance of checking CBGs and maintaining good CBG control to prevent long-term and short-term complications. Reviewed signs and symptoms of hyperglycemia and hypoglycemia and how to treat hypoglycemia at home. Also reviewed blood sugar goals at home.   Thank you, [name]. Garlen Reinig, RN, MSN, CDE  Diabetes Coordinator Inpatient Glycemic Control Team Team Pager [phone] (8am-5pm) 10/18/2018 8:51 AM

## 2018-10-18 NOTE — Progress Notes (Signed)
Pharmacy Antibiotic Note  [name] is a 58 y.o. female admitted on 10/16/2018 with emphysematous cystitis.  Pharmacy has been consulted for levofloxacin dosing.  Today, 10/18/2018 Day #3 levaquin - afeb - wbc wnl - scr 1.32 (crcl~55)  Plan: Continue Levofloxacin 500mg  iv q24hr F/u transition to PO  Height: 5\' 5"  (165.1 cm) Weight: 219 lb 2.2 oz (99.4 kg) IBW/kg (Calculated) : 57  Temp (24hrs), Avg:98.1 F (36.7 C), Min:97.5 F (36.4 C), Max:98.9 F (37.2 C)  Recent Labs  Lab 10/16/18 0209 10/16/18 0852 10/17/18 0349  WBC 7.2 6.9  --   CREATININE 1.28* 1.32* 1.32*    Estimated Creatinine Clearance: 54.9 mL/min (A) (by C-G formula based on SCr of 1.32 mg/dL (H)).    Allergies  Allergen Reactions  . Penicillins Hives    Has patient had a PCN reaction causing immediate rash, facial/tongue/throat swelling, SOB or lightheadedness with hypotension: No Has patient had a PCN reaction causing severe rash involving mucus membranes or skin necrosis: No Has patient had a PCN reaction that required hospitalization No Has patient had a PCN reaction occurring within the last 10 years: No If all of the above answers are "NO", then may proceed with Cephalosporin use.   . Erythromycin Diarrhea  . Gadolinium Nausea And Vomiting     Code: VOM, Desc: Pt began vomiting immed post infusion of multihance, Onset Date: [id number]   . Iodine-131 Nausea And Vomiting  . Ivp Dye [Iodinated Diagnostic Agents] Nausea And Vomiting       . Metformin Diarrhea    Antimicrobials this admission:  2/26 Levofloxacin  >>  Dose adjustments this admission:   Microbiology results:  2/26 UCx: reincubating 2/27 BCx (after abx):  Thank you for allowing pharmacy to be a part of this patient's care.  Loralee Pacas, PharmD, BCPS Pager: [phone] 10/18/2018 7:18 AM

## 2018-10-18 NOTE — Progress Notes (Signed)
PROGRESS NOTE    Nichole HarriesRobin A Laux  ZOX:[id number] DOB: 1961/05/27 DOA: 10/16/2018 PCP: Loyal JacobsonKalish, Michael, MD   Brief Narrative: Patient is a 58 year old female with history of poorly controlled type diabetes mellitus, gastroparesis, stage III CKD, peripheral artery disease status post bilateral BKA, TIA, hypertension, hyperlipidemia, sick sinus syndrome and status post pacemaker placement who presented to the emergency room with complaints of left flank/abdominal pain for 24 hours.  CT imaging showed distended urinary bladder with perivesicular fat stranding and emphysematous cystitis.  Urology was consulted but recommended to follow-up as an outpatient.  Urine culture has been sent. Showing Ecoli now. Started on IV antibiotics.  Blood sugars still high.  Diabetic coordinator following.  Assessment & Plan:   Principal Problem:   Emphysematous cystitis Active Problems:   Diabetes mellitus type 2, uncontrolled, with complications (HCC)   HTN (hypertension)   Hypothyroidism   Chronic diastolic heart failure (HCC)   OSA on CPAP with oxygen   CKD (chronic kidney disease), stage III (HCC)   Diastolic CHF (HCC)   Cystitis   Emphysematous cystitis: High suspicion for urinary tract infection and possible sepsis.  We will follow-up urine and blood culture report.  Started on IV Levaquin.  We will continue IV antibiotics for today.  Was unable to take oral antibiotic on presentation because of continuous left flank pain. Discussed with urology earlier who reviewed the CT scan and recommended continue IV antibiotics.  She will follow-up with urology as an outpatient, Dr. Laverle PatterBorden  UTI: Urine culture showing significant E. coli.  We will follow-up final sensitivity report.  Patient is hemodynamically stable, afebrile.  Uncontrolled type 2 diabetes mellitus: .  Diabetic coordinator  following.  Dose of insulin increased.We will continue monitor blood sugars.HbA1C is 14.9  Hypertension: Continue Coreg  and as needed hydralazine.  Sleep apnea: Continue CPAP  CKD stage III: Currently kidney function is at baseline.  Continue to monitor  Hypothyroidism: Continue Synthyroid  History of gastroparesis: On Reglan at home.  Sick sinus syndrome: Status post pacemaker placement.  Peripheral artery disease: Status post bilateral BKA  History of TIA: On Plavix and statin at home.  Increased risks  urinary tract infection /sepsis on this poorly controlled diabetic lady.  Was unable to take antibiotic orally by mouth on admission.  We will continue IV antibiotics for now.  Also needs inpatient stay for extremely uncontrolled diabetes mellitus.       DVT prophylaxis: Lovenox Code Status: Full Family Communication: None present at the bedside Disposition Plan: Home in 1 to 2 days   Consultants: None  Procedures: None  Antimicrobials:  Anti-infectives (From admission, onward)   Start     Dose/Rate Route Frequency Ordered Stop   10/17/18 0600  levofloxacin (LEVAQUIN) IVPB 500 mg     500 mg 100 mL/hr over 60 Minutes Intravenous Every 24 hours 10/16/18 0649     10/16/18 0445  levofloxacin (LEVAQUIN) IVPB 500 mg     500 mg 100 mL/hr over 60 Minutes Intravenous  Once 10/16/18 0431 10/16/18 0539      Subjective: Patient seen and examined at the bedside this morning.  Remains comfortable.  Hemodynamically stable.  Eager to go home.  Urine culture showing E. coli but final sensitivities not back.  Objective: Vitals:   10/17/18 1228 10/17/18 2131 10/18/18 0458 10/18/18 0500  BP: (!) 152/65 (!) 171/77 (!) 151/68   Pulse: 70 70 71   Resp: 13 18 16    Temp: 98.9 F (37.2 C) 97.8 F (36.6  C) (!) 97.5 F (36.4 C)   TempSrc: Oral Oral Oral   SpO2: 95% 96% 94%   Weight:    99.4 kg  Height:        Intake/Output Summary (Last 24 hours) at 10/18/2018 1209 Last data filed at 10/18/2018 0503 Gross per 24 hour  Intake 720 ml  Output -  Net 720 ml   Filed Weights   10/16/18 1333 10/17/18  0500 10/18/18 0500  Weight: 97.5 kg 101.2 kg 99.4 kg    Examination:  General exam: Appears calm and comfortable ,Not in distress,obese HEENT:PERRL,Oral mucosa moist, Ear/Nose normal on gross exam Respiratory system: Bilateral equal air entry, normal vesicular breath sounds, no wheezes or crackles  Cardiovascular system: S1 & S2 heard, RRR. No JVD, murmurs, rubs, gallops or clicks. Gastrointestinal system: Abdomen is nondistended, soft and nontender. No organomegaly or masses felt. Normal bowel sounds heard. Central nervous system: Alert and oriented. No focal neurological deficits. Extremities: B/L BKA Skin: No rashes, lesions or ulcers,no icterus ,no pallor  Data Reviewed: I have personally reviewed following labs and imaging studies  CBC: Recent Labs  Lab 10/16/18 0209 10/16/18 0852  WBC 7.2 6.9  NEUTROABS  --  3.8  HGB 12.5 12.1  HCT 38.5 37.4  MCV 84.2 86.2  PLT 176 164   Basic Metabolic Panel: Recent Labs  Lab 10/16/18 0209 10/16/18 0852 10/17/18 0349  NA 133* 136 135  K 4.1 4.7 4.6  CL 100 103 104  CO2 23 25 23   GLUCOSE 524* 440* 420*  BUN 40* 40* 46*  CREATININE 1.28* 1.32* 1.32*  CALCIUM 9.1 8.9 9.0   GFR: Estimated Creatinine Clearance: 54.9 mL/min (A) (by C-G formula based on SCr of 1.32 mg/dL (H)). Liver Function Tests: Recent Labs  Lab 10/16/18 0209 10/16/18 0852  AST 23 21  ALT 22 19  ALKPHOS 143* 119  BILITOT 0.5 0.5  PROT 6.9 6.6  ALBUMIN 3.3* 3.0*   No results for input(s): LIPASE, AMYLASE in the last 168 hours. No results for input(s): AMMONIA in the last 168 hours. Coagulation Profile: No results for input(s): INR, PROTIME in the last 168 hours. Cardiac Enzymes: No results for input(s): CKTOTAL, CKMB, CKMBINDEX, TROPONINI in the last 168 hours. BNP (last 3 results) No results for input(s): PROBNP in the last 8760 hours. HbA1C: Recent Labs    10/17/18 1013  HGBA1C 14.9*   CBG: Recent Labs  Lab 10/17/18 0815 10/17/18 1208  10/17/18 1635 10/17/18 2126 10/18/18 0737  GLUCAP 378* 375* 312* 247* 345*   Lipid Profile: No results for input(s): CHOL, HDL, LDLCALC, TRIG, CHOLHDL, LDLDIRECT in the last 72 hours. Thyroid Function Tests: No results for input(s): TSH, T4TOTAL, FREET4, T3FREE, THYROIDAB in the last 72 hours. Anemia Panel: No results for input(s): VITAMINB12, FOLATE, FERRITIN, TIBC, IRON, RETICCTPCT in the last 72 hours. Sepsis Labs: No results for input(s): PROCALCITON, LATICACIDVEN in the last 168 hours.  Recent Results (from the past 240 hour(s))  Urine culture     Status: Abnormal (Preliminary result)   Collection Time: 10/16/18  3:33 AM  Result Value Ref Range Status   Specimen Description   Final    URINE, CLEAN CATCH Performed at [facility], [address] [place], [place] [address]    Special Requests   Final    NONE Performed at [facility], [address] [place], [place] [address]    Culture (A)  Final    >=100,000 COLONIES/mL ESCHERICHIA COLI CULTURE REINCUBATED FOR  BETTER GROWTH Performed at [facility], [address], [place], [place] [address]    Report Status PENDING  Incomplete  Culture, blood (routine x 2)     Status: None (Preliminary result)   Collection Time: 10/17/18  2:36 PM  Result Value Ref Range Status   Specimen Description BLOOD RIGHT HAND  Final   Special Requests   Final    BOTTLES DRAWN AEROBIC AND ANAEROBIC Blood Culture adequate volume Performed at [facility], [address] [place], [place] [address]    Culture NO GROWTH < 24 HOURS  Final   Report Status PENDING  Incomplete  Culture, blood (routine x 2)     Status: None (Preliminary result)   Collection Time: 10/17/18  2:42 PM  Result Value Ref Range Status   Specimen Description BLOOD RIGHT HAND  Final   Special Requests   Final    BOTTLES DRAWN AEROBIC AND ANAEROBIC Blood Culture adequate volume Performed at [facility], [address] [place], [place] [address]    Culture NO GROWTH < 24 HOURS  Final   Report Status PENDING  Incomplete         Radiology Studies: No results found.      Scheduled Meds: . carvedilol  3.125 mg Oral BID WC  . clopidogrel  75 mg Oral Daily  . enoxaparin (LOVENOX) injection  40 mg Subcutaneous Q24H  . fenofibrate  160 mg Oral Daily  . FLUoxetine  10 mg Oral Daily  . insulin aspart  0-15 Units Subcutaneous TID WC  . insulin aspart  30 Units Subcutaneous TID WC  . insulin glargine  80 Units Subcutaneous BID  . levothyroxine  50 mcg Oral QAC breakfast  . metoCLOPramide  10 mg Oral TID WC & HS  . oxybutynin  15 mg Oral QHS  . pantoprazole  40 mg Oral Daily  . rosuvastatin  40 mg Oral q1800   Continuous Infusions: . levofloxacin (LEVAQUIN) IV 500 mg (10/18/18 0503)     LOS: 1 day    Time spent:35 mins. More than 50% of that time was spent in counseling and/or coordination of care.      Burnadette Pop, MD [facility] Pager [phone]  If 7PM-7AM, please contact night-coverage [email or web address] Password [email or web address] 10/18/2018, 12:09 PM

## 2018-10-19 LAB — URINE CULTURE: Culture: >=100000 — AB

## 2018-10-19 LAB — GLUCOSE, CAPILLARY
Glucose-Capillary: 204 mg/dL — ABNORMAL HIGH (ref 70–99)
Glucose-Capillary: 264 mg/dL — ABNORMAL HIGH (ref 70–99)

## 2018-10-19 MED ORDER — LEVOFLOXACIN 500 MG PO TABS: 500.0000 mg | ORAL_TABLET | Freq: Every day | ORAL | 0 refills | Status: AC

## 2018-10-19 MED ORDER — SODIUM CHLORIDE 0.9 % IV SOLN: INTRAVENOUS | Status: DC | PRN

## 2018-10-19 NOTE — Discharge Summary (Signed)
Physician Discharge Summary  Nichole HarriesRobin A Odonell UJW:[id number] DOB: Dec 03, 1960 DOA: 10/16/2018  PCP: Loyal JacobsonKalish, Michael, MD  Admit date: 10/16/2018 Discharge date: 10/19/2018  Admitted From: Home Disposition:  Home  Discharge Condition:Stable CODE STATUS:FULL Diet recommendation: Heart Healthy   Brief/Interim Summary:  Patient is a 58 year old female with history of poorly controlled type diabetes mellitus, gastroparesis, stage III CKD, peripheral artery disease status post bilateral BKA, TIA, hypertension, hyperlipidemia, sick sinus syndrome and status post pacemaker placement who presented to the emergency room with complaints of left flank/abdominal pain for 24 hours.  CT imaging showed distended urinary bladder with perivesicular fat stranding and emphysematous cystitis.  Urology was consulted but recommended to follow-up as an outpatient.  Urine culture has been sent. Showed pansensitive  Ecoli .   Her diabetes was also found to be pretty uncontrolled.  Diabetic coordinator following her and recommended to continue home regimen on discharge. She is stable for discharge to home today.  Following problems were addressed during her hospitalization:  Emphysematous cystitis: High suspicion for urinary tract infection and possible sepsis.   Was unable to take oral antibiotic on presentation because of continuous left flank pain. Discussed with urology earlier who reviewed the CT scan and recommended continue IV antibiotics.  She will follow-up with urology as an outpatient, Dr. Laverle PatterBorden.  UTI: Urine culture showing significant E. Coli which is pansensitive . Patient is hemodynamically stable, afebrile.  Uncontrolled type 2 diabetes mellitus: .  Diabetic coordinator was  following. HbA1C is 14.9.  Continue home regimen on discharge.  We recommend to follow-up with PCP/endocrinologist for close monitoring of the diabetes.  Hypertension: Continue home meds.  Sleep apnea: Continue CPAP  CKD stage  III: Currently kidney function is at baseline.    Hypothyroidism: Continue Synthyroid  History of gastroparesis: On Reglan at home.  Sick sinus syndrome: Status post pacemaker placement.  Peripheral artery disease: Status post bilateral BKA  History of TIA: On Plavix and statin at home.       Discharge Diagnoses:  Principal Problem:   Emphysematous cystitis Active Problems:   Diabetes mellitus type 2, uncontrolled, with complications (HCC)   HTN (hypertension)   Hypothyroidism   Chronic diastolic heart failure (HCC)   OSA on CPAP with oxygen   CKD (chronic kidney disease), stage III (HCC)   Diastolic CHF (HCC)   Cystitis    Discharge Instructions  Discharge Instructions    Diet Carb Modified   Complete by:  As directed    Discharge instructions   Complete by:  As directed    1) Take prescribed medication as instructed. 2)Please monitor your blood sugars at home.  Follow-up with your PCP/endocrinologist for better control of your diabetes.  Continue insulin at home.   Increase activity slowly   Complete by:  As directed      Allergies as of 10/19/2018      Reactions   Penicillins Hives   Has patient had a PCN reaction causing immediate rash, facial/tongue/throat swelling, SOB or lightheadedness with hypotension: No Has patient had a PCN reaction causing severe rash involving mucus membranes or skin necrosis: No Has patient had a PCN reaction that required hospitalization No Has patient had a PCN reaction occurring within the last 10 years: No If all of the above answers are "NO", then may proceed with Cephalosporin use.   Erythromycin Diarrhea   Gadolinium Nausea And Vomiting    Code: VOM, Desc: Pt began vomiting immed post infusion of multihance, Onset Date: [id number]   Iodine-131  Nausea And Vomiting   Ivp Dye [iodinated Diagnostic Agents] Nausea And Vomiting      Metformin Diarrhea      Medication List    TAKE these medications   carvedilol 3.125  MG tablet Commonly known as:  COREG Take 1 tablet (3.125 mg total) by mouth 2 (two) times daily with a meal.   clopidogrel 75 MG tablet Commonly known as:  PLAVIX Take 1 tablet (75 mg total) by mouth daily.   EASY COMFORT LANCETS Misc test blood sugar THREE TIMES DAILY   fenofibrate 160 MG tablet Take 1 tablet (160 mg total) by mouth daily.   FLUoxetine 10 MG capsule Commonly known as:  PROZAC Take 10 mg by mouth daily.   furosemide 40 MG tablet Commonly known as:  LASIX Take 0.5 tablets (20 mg total) daily as needed by mouth.   insulin aspart 100 UNIT/ML injection Commonly known as:  novoLOG Inject 15 Units 3 (three) times daily with meals into the skin. What changed:    how much to take  additional instructions   levofloxacin 500 MG tablet Commonly known as:  LEVAQUIN Take 1 tablet (500 mg total) by mouth daily for 1 day. Start taking on:  September 22, 2019   levothyroxine 50 MCG tablet Commonly known as:  SYNTHROID, LEVOTHROID Take 50 mcg by mouth daily before breakfast.   metoCLOPramide 10 MG tablet Commonly known as:  REGLAN Take 10 mg by mouth 4 (four) times daily. Take 1 tablet (10 mg) by mouth 30 minutes before meals and at bedtime   oxybutynin 15 MG 24 hr tablet Commonly known as:  DITROPAN XL Take 15 mg by mouth at bedtime.   pantoprazole 40 MG tablet Commonly known as:  PROTONIX Take 40 mg by mouth daily.   rosuvastatin 40 MG tablet Commonly known as:  CRESTOR Take 40 mg by mouth daily.   TRESIBA FLEXTOUCH 200 UNIT/ML Sopn Generic drug:  Insulin Degludec Inject 120 Units into the skin daily.      Follow-up Information    Loyal JacobsonKalish, Michael, MD. Schedule an appointment as soon as possible for a visit in 1 week(s).   Specialty:  Family Medicine Contact information: [address] [place] [id number] [phone]          Allergies  Allergen Reactions  . Penicillins Hives    Has patient had a PCN reaction causing  immediate rash, facial/tongue/throat swelling, SOB or lightheadedness with hypotension: No Has patient had a PCN reaction causing severe rash involving mucus membranes or skin necrosis: No Has patient had a PCN reaction that required hospitalization No Has patient had a PCN reaction occurring within the last 10 years: No If all of the above answers are "NO", then may proceed with Cephalosporin use.   . Erythromycin Diarrhea  . Gadolinium Nausea And Vomiting     Code: VOM, Desc: Pt began vomiting immed post infusion of multihance, Onset Date: [id number]   . Iodine-131 Nausea And Vomiting  . Ivp Dye [Iodinated Diagnostic Agents] Nausea And Vomiting       . Metformin Diarrhea    Consultations:  None   Procedures/Studies: Ct Renal Stone Study  Result Date: 10/16/2018 CLINICAL DATA:  LEFT flank pain beginning last night, frequent urination. History of diabetes, bilateral lower extremity amputation, bladder surgery. EXAM: CT ABDOMEN AND PELVIS WITHOUT CONTRAST TECHNIQUE: Multidetector CT imaging of the abdomen and pelvis was performed following the standard protocol without IV contrast. COMPARISON:  CT abdomen and pelvis October 14, 2015 FINDINGS: LOWER CHEST: Lung bases are clear. The visualized heart size is normal. Cardiac pacemaker leads. No pericardial effusion. HEPATOBILIARY: Normal. PANCREAS: Normal. SPLEEN: Normal. ADRENALS/URINARY TRACT: Kidneys are orthotopic, demonstrating normal size and morphology. No nephrolithiasis, hydronephrosis; limited assessment for renal masses by nonenhanced CT. The unopacified ureters are normal in course and caliber. Urinary bladder is well distended with non dependent and intraluminal gas. Mild perivesicular fat stranding. STOMACH/BOWEL: The stomach, small and large bowel are normal in course and caliber without inflammatory changes, sensitivity decreased by lack of enteric contrast. Small duodenal diverticulum. Mild colonic diverticulosis. Normal appendix.  VASCULAR/LYMPHATIC: Aortoiliac vessels are normal in course and caliber. Mild calcific atherosclerosis. No lymphadenopathy by CT size criteria. REPRODUCTIVE: Normal. OTHER: No intraperitoneal free fluid or free air. MUSCULOSKELETAL: Non-acute. LEFT thoracic paraspinal sebaceous cyst. IMPRESSION: 1. Emphysematous cystitis. 2. Mild colonic diverticulosis without acute diverticulitis. 3. Acute findings discussed with and reconfirmed by Dr.SHAWN JOY on 10/16/2018 at 3:27 am. Electronically Signed   By: Awilda Metro M.D.   On: 10/16/2018 03:27       Subjective: Patient seen and examined at the bedside this morning.  Remains comfortable.  Hemodynamically stable for discharge today.  Discharge Exam: Vitals:   10/18/18 2047 10/19/18 0512  BP: (!) 153/66 111/83  Pulse: 70 (!) 108  Resp: 16 20  Temp: 98.2 F (36.8 C) 98.1 F (36.7 C)  SpO2: 95% 95%   Vitals:   10/18/18 0500 10/18/18 1357 10/18/18 2047 10/19/18 0512  BP:  (!) 156/74 (!) 153/66 111/83  Pulse:  79 70 (!) 108  Resp:  14 16 20   Temp:  (!) 97.5 F (36.4 C) 98.2 F (36.8 C) 98.1 F (36.7 C)  TempSrc:  Oral Oral Oral  SpO2:  96% 95% 95%  Weight: 99.4 kg   96.7 kg  Height:        General: Pt is alert, awake, not in acute distress Cardiovascular: RRR, S1/S2 +, no rubs, no gallops Respiratory: CTA bilaterally, no wheezing, no rhonchi Abdominal: Soft, NT, ND, bowel sounds + Extremities: no edema, no cyanosis, bilateral BKA.    The results of significant diagnostics from this hospitalization (including imaging, microbiology, ancillary and laboratory) are listed below for reference.     Microbiology: Recent Results (from the past 240 hour(s))  Urine culture     Status: Abnormal   Collection Time: 10/16/18  3:33 AM  Result Value Ref Range Status   Specimen Description   Final    URINE, CLEAN CATCH Performed at [facility], [address], [place], [place] [address]    Special Requests   Final     NONE Performed at [facility], [address], [place], [place] [address]    Culture >=100,000 COLONIES/mL ESCHERICHIA COLI (A)  Final   Report Status 10/19/2018 FINAL  Final   Organism ID, Bacteria ESCHERICHIA COLI (A)  Final      Susceptibility   Escherichia coli - MIC*    AMPICILLIN >=32 RESISTANT Resistant     CEFAZOLIN <=4 SENSITIVE Sensitive     CEFTRIAXONE <=1 SENSITIVE Sensitive     CIPROFLOXACIN 1 SENSITIVE Sensitive     GENTAMICIN <=1 SENSITIVE Sensitive     IMIPENEM <=0.25 SENSITIVE Sensitive     NITROFURANTOIN <=16 SENSITIVE Sensitive     TRIMETH/SULFA >=320 RESISTANT Resistant     AMPICILLIN/SULBACTAM 16 INTERMEDIATE Intermediate     PIP/TAZO 8 SENSITIVE Sensitive     Extended ESBL NEGATIVE Sensitive     * >=  100,000 COLONIES/mL ESCHERICHIA COLI  Culture, blood (routine x 2)     Status: None (Preliminary result)   Collection Time: 10/17/18  2:36 PM  Result Value Ref Range Status   Specimen Description BLOOD RIGHT HAND  Final   Special Requests   Final    BOTTLES DRAWN AEROBIC AND ANAEROBIC Blood Culture adequate volume Performed at [facility], [address], [place], [place] [address]    Culture NO GROWTH < 24 HOURS  Final   Report Status PENDING  Incomplete  Culture, blood (routine x 2)     Status: None (Preliminary result)   Collection Time: 10/17/18  2:42 PM  Result Value Ref Range Status   Specimen Description BLOOD RIGHT HAND  Final   Special Requests   Final    BOTTLES DRAWN AEROBIC AND ANAEROBIC Blood Culture adequate volume Performed at [facility], [address], [place], [place] [address]    Culture NO GROWTH < 24 HOURS  Final   Report Status PENDING  Incomplete     Labs: BNP (last 3 results) No results for input(s): BNP in the last 8760 hours. Basic Metabolic Panel: Recent Labs  Lab 10/16/18 0209 10/16/18 0852 10/17/18 0349  NA 133* 136 135  K 4.1 4.7 4.6  CL 100 103 104  CO2 GLUCOSE 524* 440* 420*  BUN 40* 40* 46*  CREATININE 1.28* 1.32* 1.32*  CALCIUM 9.1 8.9 9.0   Liver Function Tests: Recent Labs  Lab 10/16/18 0209 10/16/18 0852  AST 23 21  ALT 22 19  ALKPHOS 143* 119  BILITOT 0.5 0.5  PROT 6.9 6.6  ALBUMIN 3.3* 3.0*   No results for input(s): LIPASE, AMYLASE in the last 168 hours. No results for input(s): AMMONIA in the last 168 hours. CBC: Recent Labs  Lab 10/16/18 0209 10/16/18 0852  WBC 7.2 6.9  NEUTROABS  --  3.8  HGB 12.5 12.1  HCT 38.5 37.4  MCV 84.2 86.2  PLT 176 164   Cardiac Enzymes: No results for input(s): CKTOTAL, CKMB, CKMBINDEX, TROPONINI in the last 168 hours. BNP: Invalid input(s): POCBNP CBG: Recent Labs  Lab 10/18/18 0737 10/18/18 1214 10/18/18 1636 10/18/18 2048 10/19/18 0741  GLUCAP 345* 293* 229* 231* 204*   D-Dimer No results for input(s): DDIMER in the last 72 hours. Hgb A1c Recent Labs    10/17/18 1013  HGBA1C 14.9*   Lipid Profile No results for input(s): CHOL, HDL, LDLCALC, TRIG, CHOLHDL, LDLDIRECT in the last 72 hours. Thyroid function studies No results for input(s): TSH, T4TOTAL, T3FREE, THYROIDAB in the last 72 hours.  Invalid input(s): FREET3 Anemia work up No results for input(s): VITAMINB12, FOLATE, FERRITIN, TIBC, IRON, RETICCTPCT in the last 72 hours. Urinalysis    Component Value Date/Time   COLORURINE YELLOW 10/16/2018 0333   APPEARANCEUR HAZY (A) 10/16/2018 0333   LABSPEC 1.021 10/16/2018 0333   PHURINE 5.0 10/16/2018 0333   GLUCOSEU >=500 (A) 10/16/2018 0333   HGBUR MODERATE (A) 10/16/2018 0333   HGBUR negative 03/25/2010 1147   BILIRUBINUR NEGATIVE 10/16/2018 0333   KETONESUR NEGATIVE 10/16/2018 0333   PROTEINUR >=300 (A) 10/16/2018 0333   UROBILINOGEN 0.2 12/07/2014 2125   NITRITE NEGATIVE 10/16/2018 0333   LEUKOCYTESUR NEGATIVE 10/16/2018 0333   Sepsis Labs Invalid input(s): PROCALCITONIN,  WBC,  LACTICIDVEN Microbiology Recent Results (from the past 240  hour(s))  Urine culture     Status: Abnormal   Collection Time: 10/16/18  3:33 AM  Result Value  Ref Range Status   Specimen Description   Final    URINE, CLEAN CATCH Performed at [facility], [address], [place], [place] [address]    Special Requests   Final    NONE Performed at [facility], [address], [place], [place] [address]    Culture >=100,000 COLONIES/mL ESCHERICHIA COLI (A)  Final   Report Status 10/19/2018 FINAL  Final   Organism ID, Bacteria ESCHERICHIA COLI (A)  Final      Susceptibility   Escherichia coli - MIC*    AMPICILLIN >=32 RESISTANT Resistant     CEFAZOLIN <=4 SENSITIVE Sensitive     CEFTRIAXONE <=1 SENSITIVE Sensitive     CIPROFLOXACIN 1 SENSITIVE Sensitive     GENTAMICIN <=1 SENSITIVE Sensitive     IMIPENEM <=0.25 SENSITIVE Sensitive     NITROFURANTOIN <=16 SENSITIVE Sensitive     TRIMETH/SULFA >=320 RESISTANT Resistant     AMPICILLIN/SULBACTAM 16 INTERMEDIATE Intermediate     PIP/TAZO 8 SENSITIVE Sensitive     Extended ESBL NEGATIVE Sensitive     * >=100,000 COLONIES/mL ESCHERICHIA COLI  Culture, blood (routine x 2)     Status: None (Preliminary result)   Collection Time: 10/17/18  2:36 PM  Result Value Ref Range Status   Specimen Description BLOOD RIGHT HAND  Final   Special Requests   Final    BOTTLES DRAWN AEROBIC AND ANAEROBIC Blood Culture adequate volume Performed at [facility], [address] [place], [place] [address]    Culture NO GROWTH < 24 HOURS  Final   Report Status PENDING  Incomplete  Culture, blood (routine x 2)     Status: None (Preliminary result)   Collection Time: 10/17/18  2:42 PM  Result Value Ref Range Status   Specimen Description BLOOD RIGHT HAND  Final   Special Requests   Final    BOTTLES DRAWN AEROBIC AND ANAEROBIC Blood Culture adequate volume Performed at [facility], [address], [place], [place] [address]    Culture NO  GROWTH < 24 HOURS  Final   Report Status PENDING  Incomplete    Please note: You were cared for by a hospitalist during your hospital stay. Once you are discharged, your primary care physician will handle any further medical issues. Please note that NO REFILLS for any discharge medications will be authorized once you are discharged, as it is imperative that you return to your primary care physician (or establish a relationship with a primary care physician if you do not have one) for your post hospital discharge needs so that they can reassess your need for medications and monitor your lab values.    Time coordinating discharge: 40 minutes  SIGNED:   Burnadette Pop, MD  [facility] 10/19/2018, 9:45 AM Pager [phone]  If 7PM-7AM, please contact night-coverage [email or web address] Password [email or web address]

## 2018-10-22 LAB — CULTURE, BLOOD (ROUTINE X 2)
CULTURE: NO GROWTH
Culture: NO GROWTH
Special Requests: ADEQUATE
Special Requests: ADEQUATE

## 2018-12-25 ENCOUNTER — Telehealth: Payer: Self-pay | Admitting: Cardiology

## 2018-12-25 NOTE — Telephone Encounter (Signed)
LMOVM for pt to return call.  Pt called back. She stated that she still wants Dr. Graciela Husbands to follow her device. She still has her [employer] monitor. I informed her that it has been disconnected and that she will need an in office visit to have the home monitor reconnected. Pt verbalized understanding.

## 2019-04-18 ENCOUNTER — Encounter: Payer: Self-pay | Admitting: Physician Assistant

## 2019-04-18 ENCOUNTER — Ambulatory Visit (INDEPENDENT_AMBULATORY_CARE_PROVIDER_SITE_OTHER): Payer: Medicare HMO | Admitting: Physician Assistant

## 2019-04-18 ENCOUNTER — Other Ambulatory Visit: Payer: Self-pay

## 2019-04-18 VITALS — BP 108/58 | HR 61 | Ht 65.0 in | Wt 215.0 lb

## 2019-04-18 DIAGNOSIS — I10 Essential (primary) hypertension: Secondary | ICD-10-CM | POA: Diagnosis not present

## 2019-04-18 DIAGNOSIS — R001 Bradycardia, unspecified: Secondary | ICD-10-CM | POA: Diagnosis not present

## 2019-04-18 NOTE — Patient Instructions (Signed)
Medication Instructions:  Your physician recommends that you continue on your current medications as directed. Please refer to the Current Medication list given to you today.  If you need a refill on your cardiac medications before your next appointment, please call your pharmacy.   Lab work: NONE ORDERED  TODAY   If you have labs (blood work) drawn today and your tests are completely normal, you will receive your results only by: Marland Kitchen MyChart Message (if you have MyChart) OR . A paper copy in the mail If you have any lab test that is abnormal or we need to change your treatment, we will call you to review the results.  Testing/Procedures: NONE ORDERED  TODAY   Follow-Up: At [facility], you and your health needs are our priority.  As part of our continuing mission to provide you with exceptional heart care, we have created designated Provider Care Teams.  These Care Teams include your primary Cardiologist (physician) and Advanced Practice Providers (APPs -  Physician Assistants and Nurse Practitioners) who all work together to provide you with the care you need, when you need it. You will need a follow up appointment in 1 years.  Please call our office 2 months in advance to schedule this appointment.  You may see Dr. Caryl Comes or one of the following Advanced Practice Providers on your designated Care Team:  Tommye Standard, PA-C  Any Other Special Instructions Will Be Listed Below (If Applicable).

## 2019-04-18 NOTE — Progress Notes (Signed)
Cardiology Office Note Date:  04/18/2019  Patient ID:  [name], [name] 09/24/1960, MRN [id number] PCP:  Loyal Jacobson, MD  Electrophysiologist:  Dr. Graciela Husbands    Chief Complaint:   Lost to f/u, last 2017, trouble with home transmissions  History of Present Illness: [name] is a 58 y.o. female with history of COPD, chronic bronchitis, HTN, HLD, hypothyroidism, OSA w/CPAP, RA, DM, neuropathy, b/l BKA, sinus node dysfunction w/PPM, TIA, smoker, CKD (III).  She comes in today to be seen for Dr. Graciela Husbands.  Last saw him Dec 2017.  She was still smoking, encouraged to quit, had recent TIA without Afib on her device, mentioned h/o orthostatic hypotension (?).  Feb 2020 had a hospital stay with UTI, Emphysematous cystitis, uncontrolled DM.  All in all is doing fairly well.  Her DM unfortunately remains ooc and is working with her endocrinologist.  She is still smoking, but says less and less.  Her vision is poor and does not get up/around much.  She has b/l LE prosthetics but are not particularly comfortable for her and for the most part uses her wheelchair for mobility.   She feels like her heart has been fine over the last few years, denies any CP, palpitations.  No SOB, denies symptoms or PND or orthopnea, ne near syncope or syncope, no dizzy spells.  She sess her PMD a few times a year, reports she has labs done regularly   Device information: Biotronik dual chamber PPM, implanted 03/05/14    Past Medical History:  Diagnosis Date  . Anxiety   . Cellulitis   . Chronic diastolic (congestive) heart failure (HCC)   . Complication of anesthesia 01/2013   "didn't know where I was; who I was; talking out the top of my head" (04/21/2013)  . Concussion as a teenager   slight  . COPD (chronic obstructive pulmonary disease) (HCC)    wear oxygen at home  . Depression   . Diabetic neuropathy (HCC)   . GERD (gastroesophageal reflux disease)   . Hyperlipidemia   . Hypertension   .  Hypothyroidism    takes Synthroid daily  . MRSA (methicillin resistant staph aureus) culture positive    hx of 2012  . Neuromuscular disorder (HCC)   . OSA on CPAP    and oxygen  . Peripheral vascular disease (HCC)   . Staph infection 2007  . Symptomatic bradycardia 03/04/2014  . TIA (transient ischemic attack)   . Type II diabetes mellitus (HCC)     Past Surgical History:  Procedure Laterality Date  . AMPUTATION Left 06/22/2017   Procedure: LEFT BELOW KNEE AMPUTATION;  Surgeon: Nadara Mustard, MD;  Location[facility] OR;  Service: Orthopedics;  Laterality: Left;  . BLADDER SURGERY  1970's   "stretched the mouth of my bladder" (04/21/2013)  . DILATION AND CURETTAGE OF UTERUS  1980's  . FOOT SURGERY Right 2012   "put pins in one year; amputated toes another OR" (04/21/2013)  . GANGLION CYST EXCISION Left 1990's   "wrist" (04/21/2013)  . I&D EXTREMITY Left 08/25/2013   Procedure: IRRIGATION AND DEBRIDEMENT EXTREMITY;  Surgeon: Axel Filler, MD;  Location: [facility] OR;  Service: General;  Laterality: Left;  . INCISION AND DRAINAGE ABSCESS N/A 01/24/2013   Procedure: INCISION AND DRAINAGE ABSCESS;  Surgeon: Clovis Pu. Cornett, MD;  Location: [facility] OR;  Service: General;  Laterality: N/A;  . INGUINAL HERNIA REPAIR N/A 01/28/2013   Procedure: I&D Right Groin Wound;  Surgeon: Shelly Rubenstein, MD;  Location: [facility] OR;  Service: General;  Laterality: N/A;  . LEG AMPUTATION BELOW KNEE Right 2012  . PERMANENT PACEMAKER INSERTION N/A 03/05/2014   Biotronik dual chamber pacemaker implanted by Dr Caryl Comes for symptomatic bradycardia  . REFRACTIVE SURGERY Bilateral 2014  . STUMP REVISION Right 12/11/2014   Procedure: Right Below Knee Amputation Revision;  Surgeon: Newt Minion, MD;  Location: [place];  Service: Orthopedics;  Laterality: Right;  . TOE AMPUTATION Left ?2011    only 2 toes remaning.   . TUBAL LIGATION  1990's  . WRIST SURGERY Right 1980's   "pinched nerve" (04/21/2013)    Current Outpatient Medications   Medication Sig Dispense Refill  . carvedilol (COREG) 3.125 MG tablet Take 1 tablet (3.125 mg total) by mouth 2 (two) times daily with a meal. 30 tablet 0  . clopidogrel (PLAVIX) 75 MG tablet Take 1 tablet (75 mg total) by mouth daily. 30 tablet 0  . EASY COMFORT LANCETS MISC test blood sugar THREE TIMES DAILY  12  . fenofibrate 160 MG tablet Take 1 tablet (160 mg total) by mouth daily. 30 tablet 0  . FLUoxetine (PROZAC) 10 MG capsule Take 10 mg by mouth daily.    . furosemide (LASIX) 40 MG tablet Take 0.5 tablets (20 mg total) daily as needed by mouth. 30 tablet 0  . insulin aspart (NOVOLOG) 100 UNIT/ML injection Inject 15 Units 3 (three) times daily with meals into the skin. (Patient taking differently: Inject 0-38 Units into the skin 3 (three) times daily with meals. Sliding scale) 10 mL 11  . [START ON 09/22/2019] levofloxacin (LEVAQUIN) 500 MG tablet Take 1 tablet (500 mg total) by mouth daily for 1 day. 1 tablet 0  . levothyroxine (SYNTHROID, LEVOTHROID) 50 MCG tablet Take 50 mcg by mouth daily before breakfast.    . metoCLOPramide (REGLAN) 10 MG tablet Take 10 mg by mouth 4 (four) times daily. Take 1 tablet (10 mg) by mouth 30 minutes before meals and at bedtime  11  . oxybutynin (DITROPAN XL) 15 MG 24 hr tablet Take 15 mg by mouth at bedtime.    . pantoprazole (PROTONIX) 40 MG tablet Take 40 mg by mouth daily.    . rosuvastatin (CRESTOR) 40 MG tablet Take 40 mg by mouth daily.    . TRESIBA FLEXTOUCH 200 UNIT/ML SOPN Inject 120 Units into the skin daily.      No current facility-administered medications for this visit.     Allergies:   Penicillins, Erythromycin, Gadolinium, Iodine-131, Ivp dye [iodinated diagnostic agents], and Metformin   Social History:  The patient  reports that she has been smoking cigarettes. She has a 16.50 pack-year smoking history. She has never used smokeless tobacco. She reports that she does not drink alcohol or use drugs.   Family History:  The patient's  family history includes Alzheimer's disease in her father; Cancer in her maternal aunt; Diabetes in her paternal aunt, paternal grandmother, and paternal uncle; Hyperlipidemia in her brother and mother; Hypertension in her brother and mother; Hypertrophic cardiomyopathy in her mother.  ROS:  Please see the history of present illness.  All other systems are reviewed and otherwise negative.   PHYSICAL EXAM:  VS:  BP (!) 108/58   Pulse 61   Ht 5\' 5"  (1.651 m)   Wt 215 lb (97.5 kg)   BMI 35.78 kg/m  BMI: Body mass index is 35.78 kg/m. Well nourished, well developed, in no acute distress  HEENT: normocephalic, atraumatic  Neck: no JVD, carotid  bruits or masses Cardiac:  RRR; 1/6 SM, no rubs, or gallops Lungs:  CTA b/l, no wheezing, rhonchi or rales  Abd: soft, nontender MS: b/l  BKAs with prosthetics Ext: n/a Skin: warm and dry, no rash Neuro:  No gross deficits appreciated Psych: euthymic mood, full affect   PPM site is stable, no tethering or discomfort   EKG:  Done today and reviewed by myself shows A Paced/V sensed, no acute looking changes. PPM nterrogation done today and reviewed by myself: battery and lead measurements are good, no stroed EGMs 71% AP 1% VP    07/06/16: TTE Study Conclusions - Left ventricle: The cavity size was normal. Wall thickness was   increased in a pattern of moderate LVH. Systolic function was   vigorous. The estimated ejection fraction was in the range of 65%   to 70%. - Aortic valve: L coronary cusp is calcified/thickened. - Mitral valve: Calcified annulus. Mildly thickened leaflets . - Pericardium, extracardiac: A trivial pericardial effusion was   identified.     Recent Labs: 10/16/2018: ALT 19; Hemoglobin 12.1; Platelets 164 10/17/2018: BUN 46; Creatinine, Ser 1.32; Potassium 4.6; Sodium 135  No results found for requested labs within last 8760 hours.   CrCl cannot be calculated (Patient's most recent lab result is older than the  maximum 21 days allowed.).   Wt Readings from Last 3 Encounters:  04/18/19 215 lb (97.5 kg)  10/19/18 213 lb 1.6 oz (96.7 kg)  11/15/17 210 lb (95.3 kg)     Other studies reviewed: Additional studies/records reviewed today include: summarized above  ASSESSMENT AND PLAN:  1. PPM     Intact function     [employer] rep was present paired up a new home transmitter and provided teaching with the patient     She is back on our remote schedule   2. HTN     Looks good today, no changes   Reviewed med list with her, she was not overly knowledgeable on her medicines, but reported they sounded accurate.  She is asked to make a list of her medicines, doses to have with her    Disposition: F/u with remotes Q 3 mo, and in-clinic in 1 year, sooner if needed.    Current medicines are reviewed at length with the patient today.  The patient did not have any concerns regarding medicines.   Norma Fredrickson, PA-C 04/18/2019 1:44 PM     [facility] [address] [place] [address] [phone] (office)  [phone] (fax)

## 2019-07-21 ENCOUNTER — Ambulatory Visit (INDEPENDENT_AMBULATORY_CARE_PROVIDER_SITE_OTHER): Payer: Medicare HMO | Admitting: *Deleted

## 2019-07-21 DIAGNOSIS — R001 Bradycardia, unspecified: Secondary | ICD-10-CM

## 2019-07-21 LAB — CUP PACEART REMOTE DEVICE CHECK
Date Time Interrogation Session: 20201130090125
Implantable Lead Implant Date: 20150716
Implantable Lead Implant Date: 20150716
Implantable Lead Location: 753859
Implantable Lead Location: 753860
Implantable Lead Model: 5076
Implantable Lead Model: 5076
Implantable Pulse Generator Implant Date: 20150716
Pulse Gen Model: 394929
Pulse Gen Serial Number: 68266686

## 2019-08-13 NOTE — Progress Notes (Signed)
PPM remote 

## 2019-10-20 ENCOUNTER — Ambulatory Visit (INDEPENDENT_AMBULATORY_CARE_PROVIDER_SITE_OTHER): Payer: Medicare HMO | Admitting: *Deleted

## 2019-10-20 DIAGNOSIS — R001 Bradycardia, unspecified: Secondary | ICD-10-CM

## 2019-10-20 LAB — CUP PACEART REMOTE DEVICE CHECK
Date Time Interrogation Session: 20210301081410
Implantable Lead Implant Date: 20150716
Implantable Lead Implant Date: 20150716
Implantable Lead Location: 753859
Implantable Lead Location: 753860
Implantable Lead Model: 5076
Implantable Lead Model: 5076
Implantable Pulse Generator Implant Date: 20150716
Pulse Gen Model: 394929
Pulse Gen Serial Number: 68266686

## 2019-10-20 NOTE — Progress Notes (Signed)
PPM Remote  

## 2020-01-21 ENCOUNTER — Ambulatory Visit (INDEPENDENT_AMBULATORY_CARE_PROVIDER_SITE_OTHER): Payer: Medicare HMO | Admitting: *Deleted

## 2020-01-21 DIAGNOSIS — I5032 Chronic diastolic (congestive) heart failure: Secondary | ICD-10-CM | POA: Diagnosis not present

## 2020-01-22 LAB — CUP PACEART REMOTE DEVICE CHECK
Date Time Interrogation Session: 20210603072525
Implantable Lead Implant Date: 20150716
Implantable Lead Implant Date: 20150716
Implantable Lead Location: 753859
Implantable Lead Location: 753860
Implantable Lead Model: 5076
Implantable Lead Model: 5076
Implantable Pulse Generator Implant Date: 20150716
Pulse Gen Model: 394929
Pulse Gen Serial Number: 68266686

## 2020-01-23 NOTE — Progress Notes (Signed)
Remote pacemaker transmission.   

## 2020-02-27 ENCOUNTER — Encounter (HOSPITAL_COMMUNITY): Payer: Self-pay

## 2020-02-27 ENCOUNTER — Emergency Department (HOSPITAL_COMMUNITY)
Admission: EM | Admit: 2020-02-27 | Discharge: 2020-02-27 | Disposition: A | Discharge status: Home / Self Care | Payer: Medicare HMO | Attending: Emergency Medicine | Admitting: Emergency Medicine

## 2020-02-27 ENCOUNTER — Emergency Department (HOSPITAL_COMMUNITY): Payer: Medicare HMO

## 2020-02-27 ENCOUNTER — Other Ambulatory Visit: Payer: Self-pay

## 2020-02-27 DIAGNOSIS — Z794 Long term (current) use of insulin: Secondary | ICD-10-CM | POA: Diagnosis not present

## 2020-02-27 DIAGNOSIS — Z7902 Long term (current) use of antithrombotics/antiplatelets: Secondary | ICD-10-CM | POA: Insufficient documentation

## 2020-02-27 DIAGNOSIS — E1122 Type 2 diabetes mellitus with diabetic chronic kidney disease: Secondary | ICD-10-CM | POA: Diagnosis not present

## 2020-02-27 DIAGNOSIS — I5032 Chronic diastolic (congestive) heart failure: Secondary | ICD-10-CM | POA: Insufficient documentation

## 2020-02-27 DIAGNOSIS — Z79899 Other long term (current) drug therapy: Secondary | ICD-10-CM | POA: Diagnosis not present

## 2020-02-27 DIAGNOSIS — N183 Chronic kidney disease, stage 3 unspecified: Secondary | ICD-10-CM | POA: Diagnosis not present

## 2020-02-27 DIAGNOSIS — M79604 Pain in right leg: Secondary | ICD-10-CM | POA: Insufficient documentation

## 2020-02-27 DIAGNOSIS — F17210 Nicotine dependence, cigarettes, uncomplicated: Secondary | ICD-10-CM | POA: Diagnosis not present

## 2020-02-27 DIAGNOSIS — I130 Hypertensive heart and chronic kidney disease with heart failure and stage 1 through stage 4 chronic kidney disease, or unspecified chronic kidney disease: Secondary | ICD-10-CM | POA: Diagnosis not present

## 2020-02-27 LAB — CBC WITH DIFFERENTIAL/PLATELET
Abs Immature Granulocytes: 0.02 10*3/uL (ref 0.00–0.07)
Basophils Absolute: 0 10*3/uL (ref 0.0–0.1)
Basophils Relative: 0 %
Eosinophils Absolute: 0.2 10*3/uL (ref 0.0–0.5)
Eosinophils Relative: 3 %
HCT: 36.2 % (ref 36.0–46.0)
Hemoglobin: 12 g/dL (ref 12.0–15.0)
Immature Granulocytes: 0 %
Lymphocytes Relative: 38 %
Lymphs Abs: 3.2 10*3/uL (ref 0.7–4.0)
MCH: 28.3 pg (ref 26.0–34.0)
MCHC: 33.1 g/dL (ref 30.0–36.0)
MCV: 85.4 fL (ref 80.0–100.0)
Monocytes Absolute: 0.6 10*3/uL (ref 0.1–1.0)
Monocytes Relative: 7 %
Neutro Abs: 4.4 10*3/uL (ref 1.7–7.7)
Neutrophils Relative %: 52 %
Platelets: 170 10*3/uL (ref 150–400)
RBC: 4.24 MIL/uL (ref 3.87–5.11)
RDW: 13.2 % (ref 11.5–15.5)
WBC: 8.4 10*3/uL (ref 4.0–10.5)
nRBC: 0 % (ref 0.0–0.2)

## 2020-02-27 LAB — BASIC METABOLIC PANEL
Anion gap: 11 (ref 5–15)
BUN: 35 mg/dL — ABNORMAL HIGH (ref 6–20)
CO2: 24 mmol/L (ref 22–32)
Calcium: 9 mg/dL (ref 8.9–10.3)
Chloride: 102 mmol/L (ref 98–111)
Creatinine, Ser: 1.54 mg/dL — ABNORMAL HIGH (ref 0.44–1.00)
GFR calc Af Amer: 43 mL/min — ABNORMAL LOW (ref >60)
GFR calc non Af Amer: 37 mL/min — ABNORMAL LOW (ref >60)
Glucose, Bld: 285 mg/dL — ABNORMAL HIGH (ref 70–99)
Potassium: 5.1 mmol/L (ref 3.5–5.1)
Sodium: 137 mmol/L (ref 135–145)

## 2020-02-27 LAB — CBG MONITORING, ED: Glucose-Capillary: 279 mg/dL — ABNORMAL HIGH (ref 70–99)

## 2020-02-27 LAB — MAGNESIUM: Magnesium: 2.3 mg/dL (ref 1.7–2.4)

## 2020-02-27 MED ORDER — CYCLOBENZAPRINE HCL 10 MG PO TABS
10.0000 mg | ORAL_TABLET | Freq: Once | ORAL | Status: AC
  Administered 2020-02-27: 10 mg via ORAL
  Filled 2020-02-27: qty 1

## 2020-02-27 MED ORDER — HYDROCODONE-ACETAMINOPHEN 5-325 MG PO TABS: 1.0000 | ORAL_TABLET | ORAL | 0 refills | Status: DC | PRN

## 2020-02-27 MED ORDER — ACETAMINOPHEN 325 MG PO TABS
650.0000 mg | ORAL_TABLET | Freq: Once | ORAL | Status: AC
  Administered 2020-02-27: 650 mg via ORAL
  Filled 2020-02-27: qty 2

## 2020-02-27 MED ORDER — HYDROCODONE-ACETAMINOPHEN 5-325 MG PO TABS
2.0000 | ORAL_TABLET | Freq: Once | ORAL | Status: AC
  Administered 2020-02-27: 2 via ORAL
  Filled 2020-02-27: qty 2

## 2020-02-27 MED ORDER — METHOCARBAMOL 500 MG PO TABS: 500.0000 mg | ORAL_TABLET | Freq: Two times a day (BID) | ORAL | 0 refills | Status: DC

## 2020-02-27 MED ORDER — ONDANSETRON HCL 4 MG PO TABS
4.0000 mg | ORAL_TABLET | Freq: Once | ORAL | Status: AC
  Administered 2020-02-27: 4 mg via ORAL
  Filled 2020-02-27: qty 1

## 2020-02-27 NOTE — ED Provider Notes (Signed)
[facility] EMERGENCY DEPARTMENT Provider Note   CSN: [id number] Arrival date & time: 02/27/20  1124     History Chief Complaint  Patient presents with  . Leg Pain    [name] is a 59 y.o. female past medical history significant for CHF, COPD, diabetic neuropathy, hypertension, MRSA, TIA, type 2 diabetes, bilateral AKA.  HPI Patient presents to emergency department today via EMS with chief complaint of pain to right leg x 1 day.  Patient states the pain started last night after she had gotten into bed.  She describes the pain as a throbbing and aching sensation.  She states it is intermittent.  When the pain is present it lasts approximately 5 seconds.  The pain is located in her right knee and radiates around to the back of the knee.  She rates the pain 10 of 10 in severity.  She did not take any medications for symptoms prior to arrival.  She denies any recent fall, injury or trauma.  She also denies any fever, chills, back pain, urinary symptoms, numbness, tingling or weakness.  She denies history of similar pain.      Past Medical History:  Diagnosis Date  . Anxiety   . Cellulitis   . Chronic diastolic (congestive) heart failure (HCC)   . Complication of anesthesia 01/2013   "didn't know where I was; who I was; talking out the top of my head" (04/21/2013)  . Concussion as a teenager   slight  . COPD (chronic obstructive pulmonary disease) (HCC)    wear oxygen at home  . Depression   . Diabetic neuropathy (HCC)   . GERD (gastroesophageal reflux disease)   . Hyperlipidemia   . Hypertension   . Hypothyroidism    takes Synthroid daily  . MRSA (methicillin resistant staph aureus) culture positive    hx of 2012  . Neuromuscular disorder (HCC)   . OSA on CPAP    and oxygen  . Peripheral vascular disease (HCC)   . Staph infection 2007  . Symptomatic bradycardia 03/04/2014  . TIA (transient ischemic attack)   . Type II diabetes mellitus [facility])      Patient Active Problem List   Diagnosis Date Noted  . 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 Cystitis 10/16/2018  . History of left below knee amputation (HCC) 07/16/2017  . Abnormal urinalysis 06/18/2017  . Diabetic gastroparesis (HCC) 06/18/2017  . History of TIA (transient ischemic attack) 06/18/2017  . Hyperlipidemia 04/10/2017  . Acquired absence of right leg below knee (HCC) 11/29/2016  . Left facial numbness   . Cerebrovascular disease   . Diabetic polyneuropathy associated with type 2 diabetes mellitus (HCC)   . Chronic nonintractable headache   . GERD (gastroesophageal reflux disease) 07/21/2016  . TIA (transient ischemic attack) 07/04/2016  . Hyperkalemia 12/07/2015  . Cellulitis in diabetic foot (HCC) 12/05/2015  . Herpes simplex labialis   . Altered mental status   . Thrush 10/20/2015  . HSV-1 (herpes simplex virus 1) infection   . Hallucinations   . Acute respiratory failure with hypoxia (HCC) 10/14/2015  . Hyperglycemia 10/13/2015  . Diabetes mellitus due to underlying condition with hyperosmolarity without nonketotic hyperglycemic-hyperosmolar coma [facility]) (HCC) 10/13/2015  . CKD (chronic kidney disease), stage III 10/13/2015  . AKI (acute kidney injury) (HCC) 10/13/2015  . Diastolic CHF (HCC) 10/13/2015  . Acute encephalopathy 10/13/2015  . 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 Lactic acid acidosis   . Diabetic foot ulcer (HCC) 12/08/2014  .  Symptomatic bradycardia 03/04/2014  . OSA on CPAP with oxygen 03/04/2014  . COPD (chronic obstructive pulmonary disease), on home O2 03/04/2014  . Bradycardia 03/04/2014  . Cellulitis 08/16/2013  . Chronic diastolic heart failure (HCC) 08/16/2013  . Sepsis (HCC) 08/16/2013  . SOB (shortness of breath) 04/20/2013  . Proteinuria 01/26/2013  . Acute on chronic kidney failure (HCC) 01/24/2013  . Abscess of groin, right 01/23/2013  . HTN (hypertension) 01/23/2013  . Leukocytosis 01/23/2013  . Anemia of  chronic disease 01/23/2013  . Hypothyroidism 01/23/2013  . Hyponatremia 10/06/2011  . HYPERCHOLESTEROLEMIA 08/17/2010  . Depression 02/01/2010  . TOBACCO ABUSE 06/04/2009  . Diabetes mellitus type 2, uncontrolled, with complications (HCC) 08/06/2007    Past Surgical History:  Procedure Laterality Date  . AMPUTATION Left 06/22/2017   Procedure: LEFT BELOW KNEE AMPUTATION;  Surgeon: Nadara Mustard, MD;  Location: [facility] OR;  Service: Orthopedics;  Laterality: Left;  . BLADDER SURGERY  1970's   "stretched the mouth of my bladder" (04/21/2013)  . DILATION AND CURETTAGE OF UTERUS  1980's  . FOOT SURGERY Right 2012   "put pins in one year; amputated toes another OR" (04/21/2013)  . GANGLION CYST EXCISION Left 1990's   "wrist" (04/21/2013)  . I & D EXTREMITY Left 08/25/2013   Procedure: IRRIGATION AND DEBRIDEMENT EXTREMITY;  Surgeon: Axel Filler, MD;  Location: [facility] OR;  Service: General;  Laterality: Left;  . INCISION AND DRAINAGE ABSCESS N/A 01/24/2013   Procedure: INCISION AND DRAINAGE ABSCESS;  Surgeon: Clovis Pu. Cornett, MD;  Location: [facility] OR;  Service: General;  Laterality: N/A;  . INGUINAL HERNIA REPAIR N/A 01/28/2013   Procedure: I&D Right Groin Wound;  Surgeon: Shelly Rubenstein, MD;  Location: [facility] OR;  Service: General;  Laterality: N/A;  . LEG AMPUTATION BELOW KNEE Right 2012  . PERMANENT PACEMAKER INSERTION N/A 03/05/2014   Biotronik dual chamber pacemaker implanted by Dr Graciela Husbands for symptomatic bradycardia  . REFRACTIVE SURGERY Bilateral 2014  . STUMP REVISION Right 12/11/2014   Procedure: Right Below Knee Amputation Revision;  Surgeon: Nadara Mustard, MD;  Location: [facility] OR;  Service: Orthopedics;  Laterality: Right;  . TOE AMPUTATION Left ?2011    only 2 toes remaning.   . TUBAL LIGATION  1990's  . WRIST SURGERY Right 1980's   "pinched nerve" (04/21/2013)     OB History   No obstetric history on file.     Family History  Problem Relation Age of Onset  . Hypertension Mother   .  Hyperlipidemia Mother   . Hypertrophic cardiomyopathy Mother        has pacer  . Alzheimer's disease Father   . Hypertension Brother   . Hyperlipidemia Brother   . Cancer Maternal Aunt   . Diabetes Paternal Aunt   . Diabetes Paternal Uncle   . Diabetes Paternal Grandmother   . Anesthesia problems Neg Hx   . Hypotension Neg Hx   . Malignant hyperthermia Neg Hx   . Pseudochol deficiency Neg Hx     Social History   Tobacco Use  . Smoking status: Current Every Day Smoker    Packs/day: 0.50    Years: 33.00    Pack years: 16.50    Types: Cigarettes  . Smokeless tobacco: Never Used  Vaping Use  . Vaping Use: Never used  Substance Use Topics  . Alcohol use: No  . Drug use: No    Home Medications Prior to Admission medications   Medication Sig Start Date End Date Taking? Authorizing Provider  carvedilol (COREG) 3.125 MG tablet Take 1 tablet (3.125 mg total) by mouth 2 (two) times daily with a meal. 10/25/15   Rolly Salter, MD  clopidogrel (PLAVIX) 75 MG tablet Take 1 tablet (75 mg total) by mouth daily. 07/24/16   Ghimire, Werner Lean, MD  EASY COMFORT LANCETS MISC test blood sugar THREE TIMES DAILY 09/23/16   [provider]  fenofibrate 160 MG tablet Take 1 tablet (160 mg total) by mouth daily. 07/24/16   Ghimire, Werner Lean, MD  FLUoxetine (PROZAC) 10 MG capsule Take 10 mg by mouth daily.    [provider]  furosemide (LASIX) 40 MG tablet Take 0.5 tablets (20 mg total) daily as needed by mouth. 06/26/17   Leatha Gilding, MD  HYDROcodone-acetaminophen (NORCO/VICODIN) 5-325 MG tablet Take 1 tablet by mouth every 4 (four) hours as needed for severe pain. 02/27/20   Janney Priego E, PA-C  insulin aspart (NOVOLOG) 100 UNIT/ML injection Inject 15 Units 3 (three) times daily with meals into the skin. Patient taking differently: Inject 0-38 Units into the skin 3 (three) times daily with meals. Sliding scale 06/26/17   Leatha Gilding, MD  levothyroxine (SYNTHROID,  LEVOTHROID) 50 MCG tablet Take 50 mcg by mouth daily before breakfast.    [provider]  methocarbamol (ROBAXIN) 500 MG tablet Take 1 tablet (500 mg total) by mouth 2 (two) times daily. 02/27/20   Gladyse Corvin E, PA-C  metoCLOPramide (REGLAN) 10 MG tablet Take 10 mg by mouth 4 (four) times daily. Take 1 tablet (10 mg) by mouth 30 minutes before meals and at bedtime 11/25/15   [provider]  oxybutynin (DITROPAN XL) 15 MG 24 hr tablet Take 15 mg by mouth at bedtime.    [provider]  pantoprazole (PROTONIX) 40 MG tablet Take 40 mg by mouth daily. 09/23/18   [provider]  rosuvastatin (CRESTOR) 40 MG tablet Take 40 mg by mouth daily.    [provider]  TRESIBA FLEXTOUCH 200 UNIT/ML SOPN Inject 120 Units into the skin daily.  09/23/18   [provider]    Allergies    Penicillins, Erythromycin, Gadolinium, Iodine-131, Ivp dye [iodinated diagnostic agents], and Metformin  Review of Systems   Review of Systems All other systems are reviewed and are negative for acute change except as noted in the HPI.  Physical Exam Updated Vital Signs BP (!) 123/53 (BP Location: Right Arm)   Pulse 65   Temp 98.5 F (36.9 C)   Resp 18   SpO2 98%   Physical Exam Vitals and nursing note reviewed.  Constitutional:      General: She is not in acute distress.    Appearance: She is not ill-appearing.  HENT:     Head: Normocephalic and atraumatic.     Right Ear: Tympanic membrane and external ear normal.     Left Ear: Tympanic membrane and external ear normal.     Nose: Nose normal.     Mouth/Throat:     Mouth: Mucous membranes are moist.     Pharynx: Oropharynx is clear.  Eyes:     General: No scleral icterus.       Right eye: No discharge.        Left eye: No discharge.     Extraocular Movements: Extraocular movements intact.     Conjunctiva/sclera: Conjunctivae normal.     Pupils: Pupils are equal, round, and reactive to light.  Neck:      Vascular: No JVD.  Cardiovascular:     Rate and Rhythm: Normal rate and regular rhythm.     Pulses: Normal pulses.          Radial pulses are 2+ on the right side and 2+ on the left side.     Heart sounds: Normal heart sounds.  Pulmonary:     Comments: Lungs clear to auscultation in all fields. Symmetric chest rise. No wheezing, rales, or rhonchi. Abdominal:     Comments: Abdomen is soft, non-distended, and non-tender in all quadrants. No rigidity, no guarding. No peritoneal signs.  Musculoskeletal:        General: Normal range of motion.     Cervical back: Normal range of motion.     Comments: Bilateral AKA with prosthetics on.  Right prosthetic was removed and there is mild erythema on right stump patient states is chronic, unchanged today.  No overlying warmth.  No open wounds  Skin:    General: Skin is warm and dry.     Capillary Refill: Capillary refill takes less than 2 seconds.  Neurological:     Mental Status: She is oriented to person, place, and time.     GCS: GCS eye subscore is 4. GCS verbal subscore is 5. GCS motor subscore is 6.     Comments: Fluent speech, no facial droop.  Psychiatric:        Behavior: Behavior normal.     ED Results / Procedures / Treatments   Labs (all labs ordered are listed, but only abnormal results are displayed) Labs Reviewed  BASIC METABOLIC PANEL - Abnormal; Notable for the following components:      Result Value   Glucose, Bld 285 (*)    BUN 35 (*)    Creatinine, Ser 1.54 (*)    GFR calc non Af Amer 37 (*)    GFR calc Af Amer 43 (*)    All other components within normal limits  CBG MONITORING, ED - Abnormal; Notable for the following components:   Glucose-Capillary 279 (*)    All other components within normal limits  CBC WITH DIFFERENTIAL/PLATELET  MAGNESIUM    EKG None  Radiology DG Knee Complete 4 Views Right  Result Date: 02/27/2020 CLINICAL DATA:  Pain. Right posterior stump pain since last night. No trauma. EXAM:  RIGHT KNEE - COMPLETE 4+ VIEW COMPARISON:  December 07, 2014 FINDINGS: The patient is status post below-the-knee amputation. The soft tissues are normal with no air. No evidence of osteomyelitis or infection. Enthesopathic changes are associated with the patella. No other significant abnormalities. IMPRESSION: 1. Below the knee amputation. 2. Enthesopathic changes associated with the patella. 3. No other significant abnormalities. Electronically Signed   By: Gerome Sam III M.D   On: 02/27/2020 12:34    Procedures Procedures (including critical care time)  Medications Ordered in ED Medications  cyclobenzaprine (FLEXERIL) tablet 10 mg (10 mg Oral Given 02/27/20 1209)  acetaminophen (TYLENOL) tablet 650 mg (650 mg Oral Given 02/27/20 1210)  HYDROcodone-acetaminophen (NORCO/VICODIN) 5-325 MG per tablet 2 tablet (2 tablets Oral Given 02/27/20 1351)  ondansetron (ZOFRAN) tablet 4 mg (4 mg Oral Given 02/27/20 1351)    ED Course  I have reviewed the triage vital signs and the nursing notes.  Pertinent labs & imaging results that were available during my care of the patient were reviewed by me and considered in my medical decision making (see chart for details).    MDM Rules/Calculators/A&P  History provided by patient with additional history obtained from chart review.    59 year old female with bilateral AKA presenting with right leg pain.  Patient presents awake, alert, hemodynamically stable, afebrile, non toxic.  CBG on arrival is 279.  During exam I have patient remove her right prosthesis. There is mild erythema to right stump that looks to be irritation. Not warm to the touch. No overlying wounds or gross abscess. No signs of infection.The pain she is describing could be related to muscle spasm given her description. Patient given flexeril and tylenol. CBC without leukocytosis, no anemia. BMP without severe electrolyte derangement, BUN/Creatinine today is 35/1.54. Care  everywhere shows patient had labs drawn on 01/27/20 with BUN/Cr 37/1.6, therefore today is slightly improved. Xray of right knee viewed by me shows no fracture, no evidence of osteomyelitis  or infection. Patient updated on results. Pain has not improved, dose of pain medication given and on reassessment patient feels pain improved. She agrees with plan to discharge home and follow up with Dr. Lajoyce Corners. I have reviewed the PDMP during this encounter. She has no recent narcotic prescriptions, will give short course of pain medicine and robaxin. Advised not to take at the same time as they can make her drowsy. Also advised she have creatinine rechecked within 1 week by pcp. The patient appears reasonably screened and/or stabilized for discharge and I doubt any other medical condition or other [facility] requiring further screening, evaluation, or treatment in the ED at this time prior to discharge. The patient is safe for discharge with strict return precautions discussed. Recommend ortho follow up for pain management with Dr. Lajoyce Corners as he performed her AKAs.   Portions of this note were generated with Scientist, clinical (histocompatibility and immunogenetics). Dictation errors may occur despite best attempts at proofreading.   Final Clinical Impression(s) / ED Diagnoses Final diagnoses:  Right leg pain    Rx / DC Orders ED Discharge Orders         Ordered    HYDROcodone-acetaminophen (NORCO/VICODIN) 5-325 MG tablet  Every 4 hours PRN     Discontinue  Reprint     02/27/20 1426    methocarbamol (ROBAXIN) 500 MG tablet  2 times daily     Discontinue  Reprint     02/27/20 1426           Jemery Stacey, Caroleen Hamman, PA-C 02/27/20 1441    Derwood Kaplan, MD 02/28/20 1627

## 2020-02-27 NOTE — ED Notes (Signed)
PTAR was arranged for patient.

## 2020-02-27 NOTE — Discharge Instructions (Addendum)
You have been seen today for leg pain. Please read and follow all provided instructions. Return to the emergency room for worsening condition or new concerning symptoms.    Your x-ray did not show any broken bones or signs of infection. Your blood work today also did not show signs of infection, your electrolytes are normal.  Your kidney function is comparable to what it was when checked 1 month ago.  1. Medications:  Prescription sent to your pharmacy for robaxin. This is a muscle relaxer. It can make you sleepy. -Prescription also sent for norco. This is a pain medicine. Do not take this at the same time as the robaxin as they are both could make you sleepy and increase your chance to fall.  Continue usual home medications Take medications as prescribed. Please review all of the medicines and only take them if you do not have an allergy to them.   2. Treatment: rest, drink plenty of fluids  3. Follow Up:  Please follow up with primary care provider by scheduling appointment to have your creatinine rechecked in 1 week. -You should also follow-up with Dr. Lajoyce Corners to further discuss your pain.   It is also a possibility that you have an allergic reaction to any of the medicines that you have been prescribed - Everybody reacts differently to medications and while MOST people have no trouble with most medicines, you may have a reaction such as nausea, vomiting, rash, swelling, shortness of breath. If this is the case, please stop taking the medicine immediately and contact your physician.  ?

## 2020-02-27 NOTE — ED Triage Notes (Signed)
Pt BIB EMS from home due to pain in right leg. Pt is bilateral AKA. Pt has procedure done 5 years ago. Pt reports she woke up this morning with pain in her right leg. Pt denies any injury.

## 2020-04-21 ENCOUNTER — Ambulatory Visit (INDEPENDENT_AMBULATORY_CARE_PROVIDER_SITE_OTHER): Payer: Medicare HMO | Admitting: *Deleted

## 2020-04-21 DIAGNOSIS — I5032 Chronic diastolic (congestive) heart failure: Secondary | ICD-10-CM

## 2020-04-21 LAB — CUP PACEART REMOTE DEVICE CHECK
Battery Remaining Percentage: 60 %
Brady Statistic RA Percent Paced: 50 %
Brady Statistic RV Percent Paced: 1 %
Date Time Interrogation Session: 20210901101713
Implantable Lead Implant Date: 20150716
Implantable Lead Implant Date: 20150716
Implantable Lead Location: 753859
Implantable Lead Location: 753860
Implantable Lead Model: 5076
Implantable Lead Model: 5076
Implantable Pulse Generator Implant Date: 20150716
Lead Channel Impedance Value: 351 Ohm
Lead Channel Impedance Value: 956 Ohm
Lead Channel Pacing Threshold Amplitude: 0.9 V
Lead Channel Pacing Threshold Amplitude: 1 V
Lead Channel Pacing Threshold Pulse Width: 0.4 ms
Lead Channel Pacing Threshold Pulse Width: 0.4 ms
Lead Channel Sensing Intrinsic Amplitude: 14.3 mV
Lead Channel Sensing Intrinsic Amplitude: 2.4 mV
Lead Channel Setting Pacing Amplitude: 2 V
Lead Channel Setting Pacing Amplitude: 2.4 V
Lead Channel Setting Pacing Pulse Width: 0.4 ms
Pulse Gen Model: 394929
Pulse Gen Serial Number: 68266686

## 2020-04-22 NOTE — Progress Notes (Signed)
Remote pacemaker transmission.   

## 2020-07-21 ENCOUNTER — Ambulatory Visit (INDEPENDENT_AMBULATORY_CARE_PROVIDER_SITE_OTHER): Payer: Medicare HMO

## 2020-07-21 DIAGNOSIS — G459 Transient cerebral ischemic attack, unspecified: Secondary | ICD-10-CM

## 2020-07-22 LAB — CUP PACEART REMOTE DEVICE CHECK
Date Time Interrogation Session: 20211202095236
Implantable Lead Implant Date: 20150716
Implantable Lead Implant Date: 20150716
Implantable Lead Location: 753859
Implantable Lead Location: 753860
Implantable Lead Model: 5076
Implantable Lead Model: 5076
Implantable Pulse Generator Implant Date: 20150716
Pulse Gen Model: 394929
Pulse Gen Serial Number: 68266686

## 2020-07-27 NOTE — Progress Notes (Signed)
Remote pacemaker transmission.   

## 2020-10-20 ENCOUNTER — Ambulatory Visit (INDEPENDENT_AMBULATORY_CARE_PROVIDER_SITE_OTHER): Payer: Medicare HMO

## 2020-10-20 DIAGNOSIS — I5032 Chronic diastolic (congestive) heart failure: Secondary | ICD-10-CM | POA: Diagnosis not present

## 2020-10-22 LAB — CUP PACEART REMOTE DEVICE CHECK
Date Time Interrogation Session: 20220302090921
Implantable Lead Implant Date: 20150716
Implantable Lead Implant Date: 20150716
Implantable Lead Location: 753859
Implantable Lead Location: 753860
Implantable Lead Model: 5076
Implantable Lead Model: 5076
Implantable Pulse Generator Implant Date: 20150716
Pulse Gen Model: 394929
Pulse Gen Serial Number: 68266686

## 2020-10-28 NOTE — Progress Notes (Signed)
Remote pacemaker transmission.   

## 2021-01-19 ENCOUNTER — Ambulatory Visit (INDEPENDENT_AMBULATORY_CARE_PROVIDER_SITE_OTHER): Payer: Medicare HMO

## 2021-01-19 DIAGNOSIS — I5032 Chronic diastolic (congestive) heart failure: Secondary | ICD-10-CM | POA: Diagnosis not present

## 2021-01-20 LAB — CUP PACEART REMOTE DEVICE CHECK
Date Time Interrogation Session: 20220601080807
Implantable Lead Implant Date: 20150716
Implantable Lead Implant Date: 20150716
Implantable Lead Location: 753859
Implantable Lead Location: 753860
Implantable Lead Model: 5076
Implantable Lead Model: 5076
Implantable Pulse Generator Implant Date: 20150716
Pulse Gen Model: 394929
Pulse Gen Serial Number: 68266686

## 2021-02-10 NOTE — Progress Notes (Signed)
Remote pacemaker transmission.   

## 2021-04-18 ENCOUNTER — Telehealth: Payer: Self-pay | Admitting: *Deleted

## 2021-04-18 NOTE — Telephone Encounter (Signed)
   [facility] Pre-operative Risk Assessment    Patient Name: LYNNIE KOEHLER  DOB: 09/26/1960 MRN: [id number]  HEARTCARE STAFF:  - IMPORTANT!!!!!! Under Visit Info/Reason for Call, type in Other and utilize the format Clearance MM/DD/YY or Clearance TBD. Do not use dashes or single digits. - Please review there is not already an duplicate clearance open for this procedure. - If request is for dental extraction, please clarify the # of teeth to be extracted. - If the patient is currently at the dentist's office, call Pre-Op Callback Staff (MA/nurse) to input urgent request.  - If the patient is not currently in the dentist office, please route to the Pre-Op pool.  Request for surgical clearance:  What type of surgery is being performed? COLONOSCOPY  When is this surgery scheduled? TBD  What type of clearance is required (medical clearance vs. Pharmacy clearance to hold med vs. Both)? MEDICAL  Are there any medications that need to be held prior to surgery and how long?  PLAVIX   Practice name and name of physician performing surgery? [facility]; DR. Dorrene German  What is the office phone number? [phone]   7.   What is the office fax number? [phone]  8.   Anesthesia type (None, local, MAC, general) ? NOT LISTED (PROPOFOL?)    Julaine Hua 04/18/2021, 5:47 PM  _________________________________________________________________   (provider comments below)

## 2021-04-19 NOTE — Telephone Encounter (Signed)
Message sent to Dr. Graciela Husbands scheduler [place] to reach to pt with an appt for pre op clearance. Pt can see Dr. Graciela Husbands or EP APP.

## 2021-04-19 NOTE — Telephone Encounter (Signed)
   Name: [name]  DOB: 13-Aug-1961  MRN: [id number]  Primary Cardiologist: Dr. Graciela Husbands  Chart reviewed as part of pre-operative protocol coverage. Patient has not been seen in our office since 03/2019. Because of [name]'s past medical history and time since last visit, she will require a follow-up visit in order to better assess preoperative cardiovascular risk.  Pre-op covering staff: - Please schedule appointment and call patient to inform them. If patient already had an upcoming appointment within acceptable timeframe, please add "pre-op clearance" to the appointment notes so provider is aware. - Please contact requesting surgeon's office via preferred method (i.e, phone, fax) to inform them of need for appointment prior to surgery.  Of note, we were asked to give our recommendations for holding Plavix. However, it looks like patient is on this for prior TIA. No diagnosis of CAD from what I can tell. We do not prescribe her Plavix. Therefore, would defer to PCP/Neurology for recommendations on holding this.  Corrin Parker, PA-C  04/19/2021, 7:28 AM

## 2021-04-20 ENCOUNTER — Ambulatory Visit (INDEPENDENT_AMBULATORY_CARE_PROVIDER_SITE_OTHER): Payer: Medicare HMO

## 2021-04-20 DIAGNOSIS — I679 Cerebrovascular disease, unspecified: Secondary | ICD-10-CM | POA: Diagnosis not present

## 2021-04-20 LAB — CUP PACEART REMOTE DEVICE CHECK
Date Time Interrogation Session: 20220830180020
Implantable Lead Implant Date: 20150716
Implantable Lead Implant Date: 20150716
Implantable Lead Location: 753859
Implantable Lead Location: 753860
Implantable Lead Model: 5076
Implantable Lead Model: 5076
Implantable Pulse Generator Implant Date: 20150716
Pulse Gen Model: 394929
Pulse Gen Serial Number: 68266686

## 2021-04-20 NOTE — Telephone Encounter (Signed)
Pt has appt 05/04/21 with Otilio Saber, PAC for pre op clearance. Notes sent to [facility] for appt. FYI sent to surgeon's office pt has appt  05/04/21.

## 2021-05-03 NOTE — Progress Notes (Deleted)
Electrophysiology Office Note Date: 05/03/2021  ID:  [name], DOB 1961-07-06, MRN [id number]  PCP: Loyal Jacobson, MD Primary Cardiologist: None Electrophysiologist: Sherryl Manges, MD   CC: Pacemaker follow-up  [name] is a 60 y.o. female seen today for Sherryl Manges, MD for routine electrophysiology followup. Last seen in   EP clinic 03/2019. Since last being seen in our clinic the patient reports doing ***.  she denies chest pain, palpitations, dyspnea, PND, orthopnea, nausea, vomiting, dizziness, syncope, edema, weight gain, or early satiety.  Device History: Biotronik dual chamber PPM, implanted 03/05/14  Past Medical History:  Diagnosis Date   Anxiety    Cellulitis    Chronic diastolic (congestive) heart failure (HCC)    Complication of anesthesia 01/2013   "didn't know where I was; who I was; talking out the top of my head" (04/21/2013)   Concussion as a teenager   slight   COPD (chronic obstructive pulmonary disease) (HCC)    wear oxygen at home   Depression    Diabetic neuropathy (HCC)    GERD (gastroesophageal reflux disease)    Hyperlipidemia    Hypertension    Hypothyroidism    takes Synthroid daily   MRSA (methicillin resistant staph aureus) culture positive    hx of 2012   Neuromuscular disorder (HCC)    OSA on CPAP    and oxygen   Peripheral vascular disease (HCC)    Staph infection 2007   Symptomatic bradycardia 03/04/2014   TIA (transient ischemic attack)    Type II diabetes mellitus (HCC)    Past Surgical History:  Procedure Laterality Date   AMPUTATION Left 06/22/2017   Procedure: LEFT BELOW KNEE AMPUTATION;  Surgeon: Nadara Mustard, MD;  Location: [facility] OR;  Service: Orthopedics;  Laterality: Left;   BLADDER SURGERY  1970's   "stretched the mouth of my bladder" (04/21/2013)   DILATION AND CURETTAGE OF UTERUS  1980's   FOOT SURGERY Right 2012   "put pins in one year; amputated toes another OR" (04/21/2013)   GANGLION CYST EXCISION Left  1990's   "wrist" (04/21/2013)   I & D EXTREMITY Left 08/25/2013   Procedure: IRRIGATION AND DEBRIDEMENT EXTREMITY;  Surgeon: Axel Filler, MD;  Location: [facility] OR;  Service: General;  Laterality: Left;   INCISION AND DRAINAGE ABSCESS N/A 01/24/2013   Procedure: INCISION AND DRAINAGE ABSCESS;  Surgeon: Clovis Pu. Cornett, MD;  Location: [facility] OR;  Service: General;  Laterality: N/A;   INGUINAL HERNIA REPAIR N/A 01/28/2013   Procedure: I&D Right Groin Wound;  Surgeon: Shelly Rubenstein, MD;  Location: [facility] OR;  Service: General;  Laterality: N/A;   LEG AMPUTATION BELOW KNEE Right 2012   PERMANENT PACEMAKER INSERTION N/A 03/05/2014   Biotronik dual chamber pacemaker implanted by Dr Graciela Husbands for symptomatic bradycardia   REFRACTIVE SURGERY Bilateral 2014   STUMP REVISION Right 12/11/2014   Procedure: Right Below Knee Amputation Revision;  Surgeon: Nadara Mustard, MD;  Location: [facility] OR;  Service: Orthopedics;  Laterality: Right;   TOE AMPUTATION Left ?2011    only 2 toes remaning.    TUBAL LIGATION  1990's   WRIST SURGERY Right 1980's   "pinched nerve" (04/21/2013)    Current Outpatient Medications  Medication Sig Dispense Refill   carvedilol (COREG) 3.125 MG tablet Take 1 tablet (3.125 mg total) by mouth 2 (two) times daily with a meal. 30 tablet 0   clopidogrel (PLAVIX) 75 MG tablet Take 1 tablet (75 mg total) by mouth daily. 30  tablet 0   EASY COMFORT LANCETS MISC test blood sugar THREE TIMES DAILY  12   fenofibrate 160 MG tablet Take 1 tablet (160 mg total) by mouth daily. 30 tablet 0   FLUoxetine (PROZAC) 10 MG capsule Take 10 mg by mouth daily.     furosemide (LASIX) 40 MG tablet Take 0.5 tablets (20 mg total) daily as needed by mouth. 30 tablet 0   HYDROcodone-acetaminophen (NORCO/VICODIN) 5-325 MG tablet Take 1 tablet by mouth every 4 (four) hours as needed for severe pain. 6 tablet 0   insulin aspart (NOVOLOG) 100 UNIT/ML injection Inject 15 Units 3 (three) times daily with meals into the skin. (Patient  taking differently: Inject 0-38 Units into the skin 3 (three) times daily with meals. Sliding scale) 10 mL 11   levothyroxine (SYNTHROID, LEVOTHROID) 50 MCG tablet Take 50 mcg by mouth daily before breakfast.     methocarbamol (ROBAXIN) 500 MG tablet Take 1 tablet (500 mg total) by mouth 2 (two) times daily. 10 tablet 0   metoCLOPramide (REGLAN) 10 MG tablet Take 10 mg by mouth 4 (four) times daily. Take 1 tablet (10 mg) by mouth 30 minutes before meals and at bedtime  11   oxybutynin (DITROPAN XL) 15 MG 24 hr tablet Take 15 mg by mouth at bedtime.     pantoprazole (PROTONIX) 40 MG tablet Take 40 mg by mouth daily.     rosuvastatin (CRESTOR) 40 MG tablet Take 40 mg by mouth daily.     TRESIBA FLEXTOUCH 200 UNIT/ML SOPN Inject 120 Units into the skin daily.      No current facility-administered medications for this visit.    Allergies:   Penicillins, Erythromycin, Gadolinium, Iodine-131, Ivp dye [iodinated diagnostic agents], and Metformin   Social History: Social History   Socioeconomic History   Marital status: Single    Spouse name: Not on file   Number of children: Not on file   Years of education: Not on file   Highest education level: Not on file  Occupational History   Not on file  Tobacco Use   Smoking status: Every Day    Packs/day: 0.50    Years: 33.00    Pack years: 16.50    Types: Cigarettes   Smokeless tobacco: Never  Vaping Use   Vaping Use: Never used  Substance and Sexual Activity   Alcohol use: No   Drug use: No   Sexual activity: Never  Other Topics Concern   Not on file  Social History Narrative   Not on file   Social Determinants of Health   Financial Resource Strain: Not on file  Food Insecurity: Not on file  Transportation Needs: Not on file  Physical Activity: Not on file  Stress: Not on file  Social Connections: Not on file  Intimate Partner Violence: Not on file    Family History: Family History  Problem Relation Age of Onset    Hypertension Mother    Hyperlipidemia Mother    Hypertrophic cardiomyopathy Mother        has pacer   Alzheimer's disease Father    Hypertension Brother    Hyperlipidemia Brother    Cancer Maternal Aunt    Diabetes Paternal Aunt    Diabetes Paternal Uncle    Diabetes Paternal Grandmother    Anesthesia problems Neg Hx    Hypotension Neg Hx    Malignant hyperthermia Neg Hx    Pseudochol deficiency Neg Hx      Review of Systems: All other  systems reviewed and are otherwise negative except as noted above.  Physical Exam: There were no vitals filed for this visit.   GEN- The patient is well appearing, alert and oriented x 3 today.   HEENT: normocephalic, atraumatic; sclera clear, conjunctiva pink; hearing intact; oropharynx clear; neck supple  Lungs- Clear to ausculation bilaterally, normal work of breathing.  No wheezes, rales, rhonchi Heart- Regular rate and rhythm, no murmurs, rubs or gallops  GI- soft, non-tender, non-distended, bowel sounds present  Extremities- no clubbing or cyanosis. No edema MS- no significant deformity or atrophy Skin- warm and dry, no rash or lesion; PPM pocket well healed Psych- euthymic mood, full affect Neuro- strength and sensation are intact  PPM Interrogation- reviewed in detail today,  See PACEART report  EKG:  EKG is ordered today. Personal review of ekg ordered today shows ***   Recent Labs: No results found for requested labs within last 8760 hours.   Wt Readings from Last 3 Encounters:  04/18/19 215 lb (97.5 kg)  10/19/18 213 lb 1.6 oz (96.7 kg)  11/15/17 210 lb (95.3 kg)     Other studies Reviewed: Additional studies/ records that were reviewed today include: Previous EP office notes, Previous remote checks, Most recent labwork.   Assessment and Plan:  1. {Blank single:19197::"SND","CHB","Second Degree AV block","Tachy-Brady syndrome","Sick sinus syndrome","Symptomatic bradycardia","Uncontrolled atrial arrhyhtmia s/p AV node  ablation"} s/p {Blank single:19197::"Medtronic","St. Jude","[employer]","Biotronik"} PPM  Normal PPM function See Pace Art report No changes today  2. HTN Stable on current regimen    Current medicines are reviewed at length with the patient today.   The patient {ACTIONS; HAS/DOES NOT HAVE:19233} concerns regarding her medicines.  The following changes were made today:  {NONE DEFAULTED:18576}  Labs/ tests ordered today include: *** No orders of the defined types were placed in this encounter.    Disposition:   Follow up with {Blank single:19197::"Dr. Allred","Dr. Amada Jupiter. Klein","Dr. Camnitz","Dr. Lambert","EP APP"} in *** {Blank single:19197::"Months","Weeks"}    Signed, Graciella Freer, PA-C  05/03/2021 2:49 PM  [facility] [address] [place] [address] [phone] (office) [phone] (fax)

## 2021-05-03 NOTE — Progress Notes (Signed)
Remote pacemaker transmission.   

## 2021-05-04 ENCOUNTER — Encounter: Payer: Medicare HMO | Admitting: Student

## 2021-05-04 DIAGNOSIS — R001 Bradycardia, unspecified: Secondary | ICD-10-CM

## 2021-06-03 ENCOUNTER — Encounter: Payer: Medicare HMO | Admitting: Student

## 2021-06-03 NOTE — Progress Notes (Deleted)
Electrophysiology Office Note Date: 06/03/2021  ID:  [name], DOB 06-13-1961, MRN [id number]  PCP: Loyal Jacobson, MD Primary Cardiologist: None Electrophysiologist: Sherryl Manges, MD   CC: Pacemaker follow-up  [name] is a 60 y.o. female seen today for Sherryl Manges, MD for routine electrophysiology followup.  Since last being seen in our clinic the patient reports doing ***.  she denies chest pain, palpitations, dyspnea, PND, orthopnea, nausea, vomiting, dizziness, syncope, edema, weight gain, or early satiety.  Device History: Biotronik dual chamber PPM, implanted 03/05/14  Past Medical History:  Diagnosis Date   Anxiety    Cellulitis    Chronic diastolic (congestive) heart failure (HCC)    Complication of anesthesia 01/2013   "didn't know where I was; who I was; talking out the top of my head" (04/21/2013)   Concussion as a teenager   slight   COPD (chronic obstructive pulmonary disease) (HCC)    wear oxygen at home   Depression    Diabetic neuropathy (HCC)    GERD (gastroesophageal reflux disease)    Hyperlipidemia    Hypertension    Hypothyroidism    takes Synthroid daily   MRSA (methicillin resistant staph aureus) culture positive    hx of 2012   Neuromuscular disorder (HCC)    OSA on CPAP    and oxygen   Peripheral vascular disease (HCC)    Staph infection 2007   Symptomatic bradycardia 03/04/2014   TIA (transient ischemic attack)    Type II diabetes mellitus (HCC)    Past Surgical History:  Procedure Laterality Date   AMPUTATION Left 06/22/2017   Procedure: LEFT BELOW KNEE AMPUTATION;  Surgeon: Nadara Mustard, MD;  Location: [facility] OR;  Service: Orthopedics;  Laterality: Left;   BLADDER SURGERY  1970's   "stretched the mouth of my bladder" (04/21/2013)   DILATION AND CURETTAGE OF UTERUS  1980's   FOOT SURGERY Right 2012   "put pins in one year; amputated toes another OR" (04/21/2013)   GANGLION CYST EXCISION Left 1990's   "wrist" (04/21/2013)   I &  D EXTREMITY Left 08/25/2013   Procedure: IRRIGATION AND DEBRIDEMENT EXTREMITY;  Surgeon: Axel Filler, MD;  Location: [facility] OR;  Service: General;  Laterality: Left;   INCISION AND DRAINAGE ABSCESS N/A 01/24/2013   Procedure: INCISION AND DRAINAGE ABSCESS;  Surgeon: Clovis Pu. Cornett, MD;  Location: [facility] OR;  Service: General;  Laterality: N/A;   INGUINAL HERNIA REPAIR N/A 01/28/2013   Procedure: I&D Right Groin Wound;  Surgeon: Shelly Rubenstein, MD;  Location: [facility] OR;  Service: General;  Laterality: N/A;   LEG AMPUTATION BELOW KNEE Right 2012   PERMANENT PACEMAKER INSERTION N/A 03/05/2014   Biotronik dual chamber pacemaker implanted by Dr Graciela Husbands for symptomatic bradycardia   REFRACTIVE SURGERY Bilateral 2014   STUMP REVISION Right 12/11/2014   Procedure: Right Below Knee Amputation Revision;  Surgeon: Nadara Mustard, MD;  Location: [facility] OR;  Service: Orthopedics;  Laterality: Right;   TOE AMPUTATION Left ?2011    only 2 toes remaning.    TUBAL LIGATION  1990's   WRIST SURGERY Right 1980's   "pinched nerve" (04/21/2013)    Current Outpatient Medications  Medication Sig Dispense Refill   carvedilol (COREG) 3.125 MG tablet Take 1 tablet (3.125 mg total) by mouth 2 (two) times daily with a meal. 30 tablet 0   clopidogrel (PLAVIX) 75 MG tablet Take 1 tablet (75 mg total) by mouth daily. 30 tablet 0   EASY COMFORT LANCETS  MISC test blood sugar THREE TIMES DAILY  12   fenofibrate 160 MG tablet Take 1 tablet (160 mg total) by mouth daily. 30 tablet 0   FLUoxetine (PROZAC) 10 MG capsule Take 10 mg by mouth daily.     furosemide (LASIX) 40 MG tablet Take 0.5 tablets (20 mg total) daily as needed by mouth. 30 tablet 0   HYDROcodone-acetaminophen (NORCO/VICODIN) 5-325 MG tablet Take 1 tablet by mouth every 4 (four) hours as needed for severe pain. 6 tablet 0   insulin aspart (NOVOLOG) 100 UNIT/ML injection Inject 15 Units 3 (three) times daily with meals into the skin. (Patient taking differently: Inject 0-38  Units into the skin 3 (three) times daily with meals. Sliding scale) 10 mL 11   levothyroxine (SYNTHROID, LEVOTHROID) 50 MCG tablet Take 50 mcg by mouth daily before breakfast.     methocarbamol (ROBAXIN) 500 MG tablet Take 1 tablet (500 mg total) by mouth 2 (two) times daily. 10 tablet 0   metoCLOPramide (REGLAN) 10 MG tablet Take 10 mg by mouth 4 (four) times daily. Take 1 tablet (10 mg) by mouth 30 minutes before meals and at bedtime  11   oxybutynin (DITROPAN XL) 15 MG 24 hr tablet Take 15 mg by mouth at bedtime.     pantoprazole (PROTONIX) 40 MG tablet Take 40 mg by mouth daily.     rosuvastatin (CRESTOR) 40 MG tablet Take 40 mg by mouth daily.     TRESIBA FLEXTOUCH 200 UNIT/ML SOPN Inject 120 Units into the skin daily.      No current facility-administered medications for this visit.    Allergies:   Penicillins, Erythromycin, Gadolinium, Iodine-131, Ivp dye [iodinated diagnostic agents], and Metformin   Social History: Social History   Socioeconomic History   Marital status: Single    Spouse name: Not on file   Number of children: Not on file   Years of education: Not on file   Highest education level: Not on file  Occupational History   Not on file  Tobacco Use   Smoking status: Every Day    Packs/day: 0.50    Years: 33.00    Pack years: 16.50    Types: Cigarettes   Smokeless tobacco: Never  Vaping Use   Vaping Use: Never used  Substance and Sexual Activity   Alcohol use: No   Drug use: No   Sexual activity: Never  Other Topics Concern   Not on file  Social History Narrative   Not on file   Social Determinants of Health   Financial Resource Strain: Not on file  Food Insecurity: Not on file  Transportation Needs: Not on file  Physical Activity: Not on file  Stress: Not on file  Social Connections: Not on file  Intimate Partner Violence: Not on file    Family History: Family History  Problem Relation Age of Onset   Hypertension Mother    Hyperlipidemia  Mother    Hypertrophic cardiomyopathy Mother        has pacer   Alzheimer's disease Father    Hypertension Brother    Hyperlipidemia Brother    Cancer Maternal Aunt    Diabetes Paternal Aunt    Diabetes Paternal Uncle    Diabetes Paternal Grandmother    Anesthesia problems Neg Hx    Hypotension Neg Hx    Malignant hyperthermia Neg Hx    Pseudochol deficiency Neg Hx      Review of Systems: All other systems reviewed and are otherwise negative except  as noted above.  Physical Exam: There were no vitals filed for this visit.   GEN- The patient is well appearing, alert and oriented x 3 today.   HEENT: normocephalic, atraumatic; sclera clear, conjunctiva pink; hearing intact; oropharynx clear; neck supple  Lungs- Clear to ausculation bilaterally, normal work of breathing.  No wheezes, rales, rhonchi Heart- Regular rate and rhythm, no murmurs, rubs or gallops  GI- soft, non-tender, non-distended, bowel sounds present  Extremities- no clubbing or cyanosis. No edema MS- no significant deformity or atrophy Skin- warm and dry, no rash or lesion; PPM pocket well healed Psych- euthymic mood, full affect Neuro- strength and sensation are intact  PPM Interrogation- reviewed in detail today,  See PACEART report  EKG:  EKG is ordered today. Personal review of ekg ordered today shows ***   Recent Labs: No results found for requested labs within last 8760 hours.   Wt Readings from Last 3 Encounters:  04/18/19 215 lb (97.5 kg)  10/19/18 213 lb 1.6 oz (96.7 kg)  11/15/17 210 lb (95.3 kg)     Other studies Reviewed: Additional studies/ records that were reviewed today include: Previous EP office notes, Previous remote checks, Most recent labwork.   Assessment and Plan:  1. SND s/p Biotronik PPM  Normal PPM function See Pace Art report No changes today  2. HTN Stable on current regimen   Current medicines are reviewed at length with the patient today.    Labs/ tests ordered  today include: *** No orders of the defined types were placed in this encounter.    Disposition:   Follow up with {Blank single:19197::"Dr. Allred","Dr. Amada Jupiter. Klein","Dr. Camnitz","Dr. Lambert","EP APP"} in {Blank single:19197::"2 weeks","4 weeks","3 months","6 months","12 months","as usual post gen change"}    Signed, Luane School  06/03/2021 10:32 AM  [facility] [address] [place] [address] [phone] (office) [phone] (fax)

## 2021-07-20 ENCOUNTER — Other Ambulatory Visit: Payer: Self-pay

## 2021-07-20 ENCOUNTER — Ambulatory Visit (INDEPENDENT_AMBULATORY_CARE_PROVIDER_SITE_OTHER): Payer: Medicare HMO

## 2021-07-20 DIAGNOSIS — I679 Cerebrovascular disease, unspecified: Secondary | ICD-10-CM

## 2021-07-20 LAB — CUP PACEART REMOTE DEVICE CHECK
Date Time Interrogation Session: 20221130074710
Implantable Lead Implant Date: 20150716
Implantable Lead Implant Date: 20150716
Implantable Lead Location: 753859
Implantable Lead Location: 753860
Implantable Lead Model: 5076
Implantable Lead Model: 5076
Implantable Pulse Generator Implant Date: 20150716
Pulse Gen Model: 394929
Pulse Gen Serial Number: 68266686

## 2021-07-28 NOTE — Progress Notes (Signed)
Remote pacemaker transmission.   

## 2021-09-04 ENCOUNTER — Encounter (HOSPITAL_COMMUNITY): Payer: Self-pay | Admitting: Emergency Medicine

## 2021-09-04 ENCOUNTER — Emergency Department (HOSPITAL_COMMUNITY)
Admission: EM | Admit: 2021-09-04 | Discharge: 2021-09-04 | Disposition: A | Discharge status: Home / Self Care | Payer: Medicare HMO | Attending: Emergency Medicine | Admitting: Emergency Medicine

## 2021-09-04 ENCOUNTER — Emergency Department (HOSPITAL_COMMUNITY): Payer: Medicare HMO

## 2021-09-04 ENCOUNTER — Other Ambulatory Visit: Payer: Self-pay

## 2021-09-04 DIAGNOSIS — Z89512 Acquired absence of left leg below knee: Secondary | ICD-10-CM | POA: Diagnosis not present

## 2021-09-04 DIAGNOSIS — B379 Candidiasis, unspecified: Secondary | ICD-10-CM | POA: Insufficient documentation

## 2021-09-04 DIAGNOSIS — M25551 Pain in right hip: Secondary | ICD-10-CM | POA: Diagnosis present

## 2021-09-04 DIAGNOSIS — I509 Heart failure, unspecified: Secondary | ICD-10-CM | POA: Insufficient documentation

## 2021-09-04 DIAGNOSIS — Z794 Long term (current) use of insulin: Secondary | ICD-10-CM | POA: Diagnosis not present

## 2021-09-04 DIAGNOSIS — Z79899 Other long term (current) drug therapy: Secondary | ICD-10-CM | POA: Diagnosis not present

## 2021-09-04 DIAGNOSIS — Z89511 Acquired absence of right leg below knee: Secondary | ICD-10-CM | POA: Diagnosis not present

## 2021-09-04 DIAGNOSIS — B372 Candidiasis of skin and nail: Secondary | ICD-10-CM

## 2021-09-04 DIAGNOSIS — E119 Type 2 diabetes mellitus without complications: Secondary | ICD-10-CM | POA: Diagnosis not present

## 2021-09-04 DIAGNOSIS — Z7901 Long term (current) use of anticoagulants: Secondary | ICD-10-CM | POA: Diagnosis not present

## 2021-09-04 DIAGNOSIS — J449 Chronic obstructive pulmonary disease, unspecified: Secondary | ICD-10-CM | POA: Diagnosis not present

## 2021-09-04 MED ORDER — NYSTATIN 100000 UNIT/GM EX CREA
TOPICAL_CREAM | Freq: Two times a day (BID) | CUTANEOUS | Status: DC
  Filled 2021-09-04: qty 15

## 2021-09-04 MED ORDER — NYSTATIN 100000 UNIT/GM EX CREA
TOPICAL_CREAM | CUTANEOUS | 1 refills | Status: DC
Start: 2021-09-04 — End: 2022-02-15

## 2021-09-04 MED ORDER — ACETAMINOPHEN 500 MG PO TABS
1000.0000 mg | ORAL_TABLET | Freq: Once | ORAL | Status: AC
  Administered 2021-09-04: 1000 mg via ORAL
  Filled 2021-09-04: qty 2

## 2021-09-04 NOTE — ED Provider Notes (Addendum)
[facility] EMERGENCY DEPARTMENT Provider Note   CSN: [id number] Arrival date & time: 09/04/21  1203     History  Chief Complaint  Patient presents with   Hip Pain    [name] is a 61 y.o. female.  The history is provided by the patient and medical records. No language interpreter was used.  Hip Pain    61 year old female with significant history of diabetes, COPD, kidney disease, CHF, peripheral vascular disease brought here via EMS for evaluation of chronic pain.  Patient report for the past 2 days she has had pain and irritation to her right groin region.  Described pain as an uncomfortable sensation, moderate in severity nonradiating.  Pain is more of a burning sensation.  She denies any associated fever, nausea, vomiting, diarrhea, constipation, dysuria.  She denies any injury.  No specific treatment tried.  Home Medications Prior to Admission medications   Medication Sig Start Date End Date Taking? Authorizing Provider  carvedilol (COREG) 3.125 MG tablet Take 1 tablet (3.125 mg total) by mouth 2 (two) times daily with a meal. 10/25/15   Lavina Hamman, MD  clopidogrel (PLAVIX) 75 MG tablet Take 1 tablet (75 mg total) by mouth daily. 07/24/16   Ghimire, Henreitta Leber, MD  EASY COMFORT LANCETS MISC test blood sugar THREE TIMES DAILY 09/23/16   [provider]  fenofibrate 160 MG tablet Take 1 tablet (160 mg total) by mouth daily. 07/24/16   Ghimire, Henreitta Leber, MD  FLUoxetine (PROZAC) 10 MG capsule Take 10 mg by mouth daily.    [provider]  furosemide (LASIX) 40 MG tablet Take 0.5 tablets (20 mg total) daily as needed by mouth. 06/26/17   Caren Griffins, MD  HYDROcodone-acetaminophen (NORCO/VICODIN) 5-325 MG tablet Take 1 tablet by mouth every 4 (four) hours as needed for severe pain. 02/27/20   Walisiewicz, Kaitlyn E, PA-C  insulin aspart (NOVOLOG) 100 UNIT/ML injection Inject 15 Units 3 (three) times daily with meals into the skin. Patient  taking differently: Inject 0-38 Units into the skin 3 (three) times daily with meals. Sliding scale 06/26/17   Caren Griffins, MD  levothyroxine (SYNTHROID, LEVOTHROID) 50 MCG tablet Take 50 mcg by mouth daily before breakfast.    [provider]  methocarbamol (ROBAXIN) 500 MG tablet Take 1 tablet (500 mg total) by mouth 2 (two) times daily. 02/27/20   Walisiewicz, Harley Hallmark, PA-C  metoCLOPramide (REGLAN) 10 MG tablet Take 10 mg by mouth 4 (four) times daily. Take 1 tablet (10 mg) by mouth 30 minutes before meals and at bedtime 11/25/15   [provider]  oxybutynin (DITROPAN XL) 15 MG 24 hr tablet Take 15 mg by mouth at bedtime.    [provider]  pantoprazole (PROTONIX) 40 MG tablet Take 40 mg by mouth daily. 09/23/18   [provider]  rosuvastatin (CRESTOR) 40 MG tablet Take 40 mg by mouth daily.    [provider]  TRESIBA FLEXTOUCH 200 UNIT/ML SOPN Inject 120 Units into the skin daily.  09/23/18   [provider]      Allergies    Penicillins, Erythromycin, Gadolinium, Iodine-131, Ivp dye [iodinated contrast media], and Metformin    Review of Systems   Review of Systems  All other systems reviewed and are negative.  Physical Exam Updated Vital Signs BP (!) 117/55 (BP Location: Right Arm)    Pulse 71    Temp 98.2 F (36.8 C) (Oral)    Resp 18  SpO2 100%  Physical Exam Vitals and nursing note reviewed.  Constitutional:      General: She is not in acute distress.    Appearance: She is well-developed. She is obese.     Comments: Obese female appears to be in no acute discomfort.  HENT:     Head: Atraumatic.  Eyes:     Conjunctiva/sclera: Conjunctivae normal.  Pulmonary:     Effort: Pulmonary effort is normal.  Abdominal:     Palpations: Abdomen is soft.     Tenderness: There is no abdominal tenderness.  Genitourinary:    Comments: Chaperone present during exam.  In the right inguinal region there is macerated skin changes to  the skin fold consistent with candidal infection.  No induration no fluctuance no cellulitis. Musculoskeletal:     Cervical back: Neck supple.     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  Skin:    Findings: No rash.  Neurological:     Mental Status: She is alert. Mental status is at baseline.  Psychiatric:        Mood and Affect: Mood normal.    ED Results / Procedures / Treatments   Labs (all labs ordered are listed, but only abnormal results are displayed) Labs Reviewed - No data to display  EKG None  Radiology DG Hip Unilat W or Wo Pelvis 2-3 Views Right  Result Date: 09/04/2021 CLINICAL DATA:  Pain right hip EXAM: DG HIP (WITH OR WITHOUT PELVIS) 2-3V RIGHT COMPARISON:  None. FINDINGS: No recent fracture or dislocation is seen. Degenerative changes are noted with bony spurs in both hips. There are no focal lytic or sclerotic lesions. IMPRESSION: No recent fracture or dislocation is seen in the right hip. Degenerative changes are noted with bony spurs in both hips. Electronically Signed   By: Elmer Picker M.D.   On: 09/04/2021 12:58    Procedures Procedures    Medications Ordered in ED Medications  nystatin cream (MYCOSTATIN) ( Topical Given 09/04/21 1502)  acetaminophen (TYLENOL) tablet 1,000 mg (1,000 mg Oral Given 09/04/21 1456)    ED Course/ Medical Decision Making/ A&P                           Medical Decision Making  BP (!) 117/55 (BP Location: Right Arm)    Pulse 71    Temp 98.2 F (36.8 C) (Oral)    Resp 18    SpO2 100%   2:49 PM This is a 61 year old female with multiple chronic disease which includes diabetes, diabetic foot ulcers with bilateral BKA with prosthesis, history of tobacco abuse.  She is here with complaints of irritation to her right inguinal region for the past 2 days.  On exam she has evidence of candidiasis to her right inguinal fold without any evidence of abscess or cellulitis.  Abdomen otherwise benign.  An x-ray of the right  hip and pelvis was obtained without any acute finding.  I suspect patient's pain is related to her candidal infection.  Otherwise she is afebrile, vital signs stable, resting comfortably in no acute discomfort.  Will provide nystatin cream as treatment and Tylenol as needed for pain.  Patient does not exhibit symptoms to suggest significant hyperglycemic state at this time.  I encouraged patient to apply Cream to the affected area twice daily for the next 2 weeks or until symptoms completely resolved.  She may follow-up closely with her primary care doctor for further care.  She  should monitor her blood sugar closely and manage her diabetes as appropriate.  Return precaution given.  Patient otherwise stable for discharge.        Final Clinical Impression(s) / ED Diagnoses Final diagnoses:  Candidiasis, intertriginous    Rx / DC Orders ED Discharge Orders          Ordered    nystatin cream (MYCOSTATIN)        09/04/21 1538              Domenic Moras, PA-C 09/04/21 1539    Domenic Moras, PA-C 09/04/21 1541    Sherwood Gambler, MD 09/04/21 1544

## 2021-09-04 NOTE — ED Provider Triage Note (Signed)
Emergency Medicine Provider Triage Evaluation Note  Tracey HarriesRobin A Dhillon , a 61 y.o. female  was evaluated in triage.  Pt complains of right hip pain over the last 2 days.  No injury or trauma.  Pain is worse with movement.  Localized to the right medial hip.  Review of Systems  Positive:  Negative: See above   Physical Exam  BP (!) 117/55 (BP Location: Right Arm)    Pulse 71    Temp 98.2 F (36.8 C) (Oral)    Resp 18    SpO2 100%  Gen:   Awake, no distress   Resp:  Normal effort  MSK:   Bilateral BKA. Other:    Medical Decision Making  Medically screening exam initiated at 12:25 PM.  Appropriate orders placed.  Tracey HarriesRobin A Froemming was informed that the remainder of the evaluation will be completed by another provider, this initial triage assessment does not replace that evaluation, and the importance of remaining in the ED until their evaluation is complete.     Honor LohFleming, Khiley Lieser St[place] Helen[place], [place]-C 09/04/21 1225

## 2021-09-04 NOTE — Discharge Instructions (Addendum)
Your discomfort to your right inguinal region is related to a yeast infection.  Please apply nystatin cream twice daily for the next 14 days or until symptoms completely resolved.  Follow-up closely with your doctor for further care.  You may take over-the-counter Tylenol as needed for pain.

## 2021-09-04 NOTE — ED Triage Notes (Signed)
Pt to triage via [facility].  C/o R hip pain since yesterday.  No known injury.

## 2021-10-19 ENCOUNTER — Ambulatory Visit (INDEPENDENT_AMBULATORY_CARE_PROVIDER_SITE_OTHER): Payer: Medicare HMO

## 2021-10-19 DIAGNOSIS — G459 Transient cerebral ischemic attack, unspecified: Secondary | ICD-10-CM | POA: Diagnosis not present

## 2021-10-20 LAB — CUP PACEART REMOTE DEVICE CHECK
Date Time Interrogation Session: 20230302100031
Implantable Lead Implant Date: 20150716
Implantable Lead Implant Date: 20150716
Implantable Lead Location: 753859
Implantable Lead Location: 753860
Implantable Lead Model: 5076
Implantable Lead Model: 5076
Implantable Pulse Generator Implant Date: 20150716
Pulse Gen Model: 394929
Pulse Gen Serial Number: 68266686

## 2021-10-26 NOTE — Progress Notes (Signed)
Remote pacemaker transmission.   

## 2021-12-21 ENCOUNTER — Encounter: Payer: Medicare HMO | Admitting: Internal Medicine

## 2021-12-21 DIAGNOSIS — Z950 Presence of cardiac pacemaker: Secondary | ICD-10-CM | POA: Insufficient documentation

## 2022-01-18 ENCOUNTER — Ambulatory Visit (INDEPENDENT_AMBULATORY_CARE_PROVIDER_SITE_OTHER): Payer: Medicare HMO

## 2022-01-18 DIAGNOSIS — I5032 Chronic diastolic (congestive) heart failure: Secondary | ICD-10-CM | POA: Diagnosis not present

## 2022-01-19 LAB — CUP PACEART REMOTE DEVICE CHECK
Date Time Interrogation Session: 20230530122217
Implantable Lead Implant Date: 20150716
Implantable Lead Implant Date: 20150716
Implantable Lead Location: 753859
Implantable Lead Location: 753860
Implantable Lead Model: 5076
Implantable Lead Model: 5076
Implantable Pulse Generator Implant Date: 20150716
Pulse Gen Model: 394929
Pulse Gen Serial Number: 68266686

## 2022-01-29 ENCOUNTER — Emergency Department (HOSPITAL_COMMUNITY)
Admission: EM | Admit: 2022-01-29 | Discharge: 2022-01-29 | Disposition: A | Discharge status: Home / Self Care | Payer: Medicare HMO | Attending: Emergency Medicine | Admitting: Emergency Medicine

## 2022-01-29 ENCOUNTER — Other Ambulatory Visit: Payer: Self-pay

## 2022-01-29 ENCOUNTER — Encounter (HOSPITAL_COMMUNITY): Payer: Self-pay

## 2022-01-29 DIAGNOSIS — E1165 Type 2 diabetes mellitus with hyperglycemia: Secondary | ICD-10-CM | POA: Diagnosis not present

## 2022-01-29 DIAGNOSIS — Z794 Long term (current) use of insulin: Secondary | ICD-10-CM | POA: Insufficient documentation

## 2022-01-29 DIAGNOSIS — Z89612 Acquired absence of left leg above knee: Secondary | ICD-10-CM | POA: Insufficient documentation

## 2022-01-29 DIAGNOSIS — E860 Dehydration: Secondary | ICD-10-CM | POA: Insufficient documentation

## 2022-01-29 DIAGNOSIS — Z79899 Other long term (current) drug therapy: Secondary | ICD-10-CM | POA: Diagnosis not present

## 2022-01-29 DIAGNOSIS — R21 Rash and other nonspecific skin eruption: Secondary | ICD-10-CM | POA: Diagnosis not present

## 2022-01-29 DIAGNOSIS — Z7902 Long term (current) use of antithrombotics/antiplatelets: Secondary | ICD-10-CM | POA: Insufficient documentation

## 2022-01-29 DIAGNOSIS — R630 Anorexia: Secondary | ICD-10-CM | POA: Diagnosis present

## 2022-01-29 DIAGNOSIS — L0233 Carbuncle of buttock: Secondary | ICD-10-CM | POA: Diagnosis not present

## 2022-01-29 DIAGNOSIS — N289 Disorder of kidney and ureter, unspecified: Secondary | ICD-10-CM

## 2022-01-29 DIAGNOSIS — Z89611 Acquired absence of right leg above knee: Secondary | ICD-10-CM | POA: Diagnosis not present

## 2022-01-29 DIAGNOSIS — R739 Hyperglycemia, unspecified: Secondary | ICD-10-CM

## 2022-01-29 DIAGNOSIS — L0293 Carbuncle, unspecified: Secondary | ICD-10-CM

## 2022-01-29 LAB — CBC WITH DIFFERENTIAL/PLATELET
Abs Immature Granulocytes: 0.07 10*3/uL (ref 0.00–0.07)
Basophils Absolute: 0 10*3/uL (ref 0.0–0.1)
Basophils Relative: 0 %
Eosinophils Absolute: 0 10*3/uL (ref 0.0–0.5)
Eosinophils Relative: 0 %
HCT: 28.4 % — ABNORMAL LOW (ref 36.0–46.0)
Hemoglobin: 9.4 g/dL — ABNORMAL LOW (ref 12.0–15.0)
Immature Granulocytes: 1 %
Lymphocytes Relative: 11 %
Lymphs Abs: 1.2 10*3/uL (ref 0.7–4.0)
MCH: 27.9 pg (ref 26.0–34.0)
MCHC: 33.1 g/dL (ref 30.0–36.0)
MCV: 84.3 fL (ref 80.0–100.0)
Monocytes Absolute: 1 10*3/uL (ref 0.1–1.0)
Monocytes Relative: 9 %
Neutro Abs: 8.6 10*3/uL — ABNORMAL HIGH (ref 1.7–7.7)
Neutrophils Relative %: 79 %
Platelets: 240 10*3/uL (ref 150–400)
RBC: 3.37 MIL/uL — ABNORMAL LOW (ref 3.87–5.11)
RDW: 13.2 % (ref 11.5–15.5)
WBC: 10.9 10*3/uL — ABNORMAL HIGH (ref 4.0–10.5)
nRBC: 0 % (ref 0.0–0.2)

## 2022-01-29 LAB — I-STAT BETA HCG BLOOD, ED (MC, WL, AP ONLY): I-stat hCG, quantitative: <5 m[IU]/mL (ref <5)

## 2022-01-29 LAB — COMPREHENSIVE METABOLIC PANEL
ALT: 35 U/L (ref 0–44)
AST: 30 U/L (ref 15–41)
Albumin: 2.3 g/dL — ABNORMAL LOW (ref 3.5–5.0)
Alkaline Phosphatase: 123 U/L (ref 38–126)
Anion gap: 14 (ref 5–15)
BUN: 71 mg/dL — ABNORMAL HIGH (ref 6–20)
CO2: 19 mmol/L — ABNORMAL LOW (ref 22–32)
Calcium: 9.2 mg/dL (ref 8.9–10.3)
Chloride: 92 mmol/L — ABNORMAL LOW (ref 98–111)
Creatinine, Ser: 2.85 mg/dL — ABNORMAL HIGH (ref 0.44–1.00)
GFR, Estimated: 18 mL/min — ABNORMAL LOW (ref >60)
Glucose, Bld: 550 mg/dL (ref 70–99)
Potassium: 4.7 mmol/L (ref 3.5–5.1)
Sodium: 125 mmol/L — ABNORMAL LOW (ref 135–145)
Total Bilirubin: 0.9 mg/dL (ref 0.3–1.2)
Total Protein: 6.6 g/dL (ref 6.5–8.1)

## 2022-01-29 LAB — LACTIC ACID, PLASMA
Lactic Acid, Venous: 2 mmol/L (ref 0.5–1.9)
Lactic Acid, Venous: 1.5 mmol/L (ref 0.5–1.9)

## 2022-01-29 MED ORDER — SODIUM CHLORIDE 0.9 % IV BOLUS: 1000.0000 mL | Freq: Once | INTRAVENOUS | Status: DC

## 2022-01-29 MED ORDER — ONDANSETRON HCL 4 MG/2ML IJ SOLN: 4.0000 mg | Freq: Once | INTRAMUSCULAR | Status: DC

## 2022-01-29 NOTE — Progress Notes (Signed)
   01/29/22 2010  Clinical Encounter Type  Visited With Health care provider;Patient  Visit Type ED;Initial  Referral From Nurse Idelia Salm, RN)  Consult/Referral To Chaplain;Other (Comment) (Lashawnta Burgert Morene Crocker)   Met Asa Saunas at patient bedside. Provided hospitality per nurses request. Susanne Greenhouse., [phone]

## 2022-01-29 NOTE — Discharge Instructions (Signed)
Make sure you drink 4 or 5 glasses of water every day.  This can improve your blood sugar and kidney function.  It appears that you are dehydrated at this time.  Make sure you stay on a low carbohydrate diet.  The sore on your bottom is a mild skin infection called a carbuncle.  This will likely heal with a warm compress on it 2 or 3 times a day.  The rash on your perineum, around the anus is probably a fungal rash.  Try using Lotrimin cream on it 2 or 3 times a day.  Take all of your medicines as directed.  Call your doctor for follow-up appointment as soon as possible.

## 2022-01-29 NOTE — ED Provider Notes (Signed)
[facility] EMERGENCY DEPARTMENT Provider Note   CSN: [id number] Arrival date & time: 01/29/22  1359     History  No chief complaint on file.   [name] is a 61 y.o. female.  HPI She presents for evaluation of an abscess of her buttock, present for several days, worsening and causing pain and swelling.  She states her sugars have been high and she has been feeling weak.  Lower leg amputation patient walks with prosthesis.  She came here by arranged transportation, not EMS.  She has felt hot but not checked her temperature.  She has had decreased appetite but not vomited.    Home Medications Prior to Admission medications   Medication Sig Start Date End Date Taking? Authorizing Provider  carvedilol (COREG) 3.125 MG tablet Take 1 tablet (3.125 mg total) by mouth 2 (two) times daily with a meal. 10/25/15   Rolly Salter, MD  clopidogrel (PLAVIX) 75 MG tablet Take 1 tablet (75 mg total) by mouth daily. 07/24/16   Ghimire, Werner Lean, MD  EASY COMFORT LANCETS MISC test blood sugar THREE TIMES DAILY 09/23/16   [provider]  fenofibrate 160 MG tablet Take 1 tablet (160 mg total) by mouth daily. 07/24/16   Ghimire, Werner Lean, MD  FLUoxetine (PROZAC) 10 MG capsule Take 10 mg by mouth daily.    [provider]  furosemide (LASIX) 40 MG tablet Take 0.5 tablets (20 mg total) daily as needed by mouth. 06/26/17   Leatha Gilding, MD  HYDROcodone-acetaminophen (NORCO/VICODIN) 5-325 MG tablet Take 1 tablet by mouth every 4 (four) hours as needed for severe pain. 02/27/20   Walisiewicz, Kaitlyn E, PA-C  insulin aspart (NOVOLOG) 100 UNIT/ML injection Inject 15 Units 3 (three) times daily with meals into the skin. Patient taking differently: Inject 0-38 Units into the skin 3 (three) times daily with meals. Sliding scale 06/26/17   Leatha Gilding, MD  levothyroxine (SYNTHROID, LEVOTHROID) 50 MCG tablet Take 50 mcg by mouth daily before breakfast.    [provider]  methocarbamol (ROBAXIN) 500 MG tablet Take 1 tablet (500 mg total) by mouth 2 (two) times daily. 02/27/20   Walisiewicz, Caroleen Hamman, PA-C  metoCLOPramide (REGLAN) 10 MG tablet Take 10 mg by mouth 4 (four) times daily. Take 1 tablet (10 mg) by mouth 30 minutes before meals and at bedtime 11/25/15   [provider]  nystatin cream (MYCOSTATIN) Apply to affected area 2 times daily x 14 days 09/04/21   Fayrene Helper, PA-C  oxybutynin (DITROPAN XL) 15 MG 24 hr tablet Take 15 mg by mouth at bedtime.    [provider]  pantoprazole (PROTONIX) 40 MG tablet Take 40 mg by mouth daily. 09/23/18   [provider]  rosuvastatin (CRESTOR) 40 MG tablet Take 40 mg by mouth daily.    [provider]  TRESIBA FLEXTOUCH 200 UNIT/ML SOPN Inject 120 Units into the skin daily.  09/23/18   [provider]      Allergies    Penicillins, Dulaglutide, Erythromycin, Gadolinium, Iodine-131, Ivp dye [iodinated contrast media], and Metformin    Review of Systems   Review of Systems  Physical Exam Updated Vital Signs BP (!) 123/58   Pulse 70   Temp 100 F (37.8 C) (Oral)   Resp 15   SpO2 95%  Physical Exam Vitals and nursing note reviewed.  Constitutional:      Appearance: She is well-developed. She is not ill-appearing.  HENT:  Head: Normocephalic and atraumatic.     Right Ear: External ear normal.     Left Ear: External ear normal.  Eyes:     Conjunctiva/sclera: Conjunctivae normal.     Pupils: Pupils are equal, round, and reactive to light.  Neck:     Trachea: Phonation normal.  Cardiovascular:     Rate and Rhythm: Normal rate and regular rhythm.     Heart sounds: Normal heart sounds.  Pulmonary:     Effort: Pulmonary effort is normal.     Breath sounds: Normal breath sounds.  Abdominal:     Palpations: Abdomen is soft.     Tenderness: There is no abdominal tenderness.  Musculoskeletal:        General: Normal range of motion.     Cervical back:  Normal range of motion and neck supple.     Comments: Bilateral BKA, wearing prostheses  Skin:    General: Skin is warm and dry.     Comments: Small furuncle, left upper medial buttock region.  Somewhat poor hygiene in the perineal area with mild erythema.  Possibly fungal related skin changes.  Neurological:     Mental Status: She is alert and oriented to person, place, and time.     Cranial Nerves: No cranial nerve deficit.     Sensory: No sensory deficit.     Motor: No abnormal muscle tone.     Coordination: Coordination normal.  Psychiatric:        Mood and Affect: Mood normal.        Behavior: Behavior normal.        Thought Content: Thought content normal.        Judgment: Judgment normal.     ED Results / Procedures / Treatments   Labs (all labs ordered are listed, but only abnormal results are displayed) Labs Reviewed  LACTIC ACID, PLASMA - Abnormal; Notable for the following components:      Result Value   Lactic Acid, Venous 2.0 (*)    All other components within normal limits  CBC WITH DIFFERENTIAL/PLATELET - Abnormal; Notable for the following components:   WBC 10.9 (*)    RBC 3.37 (*)    Hemoglobin 9.4 (*)    HCT 28.4 (*)    Neutro Abs 8.6 (*)    All other components within normal limits  COMPREHENSIVE METABOLIC PANEL - Abnormal; Notable for the following components:   Sodium 125 (*)    Chloride 92 (*)    CO2 19 (*)    Glucose, Bld 550 (*)    BUN 71 (*)    Creatinine, Ser 2.85 (*)    Albumin 2.3 (*)    GFR, Estimated 18 (*)    All other components within normal limits  CULTURE, BLOOD (ROUTINE X 2)  CULTURE, BLOOD (ROUTINE X 2)  LACTIC ACID, PLASMA  I-STAT BETA HCG BLOOD, ED ([facility], [facility], [facility] ONLY)    EKG None  Radiology No results found.  Procedures Procedures    Medications Ordered in ED Medications  sodium chloride 0.9 % bolus 1,000 mL (has no administration in time range)    ED Course/ Medical Decision Making/ A&P                            Medical Decision Making Elderly debilitated female who has decreased functional capacity for which she is currently seeing physical therapy is presenting for an abscess.  She has diabetes and prior complications requiring  bilateral lower leg amputations.  Amount and/or Complexity of Data Reviewed Independent Historian:     Details: Cogent historian External Data Reviewed: notes.    Details: Detailed notes for physical therapy regarding her debilitated status. Labs: ordered.    Details: CBC, metabolic panel, lactic acid, blood cultures, pregnancy test-normal except lactate borderline elevated, white count minimally elevated, hemoglobin low, sodium low, chloride low, CO2 low, glucose high, BUN high, creatinine high, albumin low, GFR low  Risk Prescription drug management. Decision regarding hospitalization. Risk Details: Patient presenting with concern for infection of her buttock.  She feels generally weak.  She is diabetic and reports she is taking her usual medications.  She has felt hot but does not have a fever here.  She has findings for hyperglycemia and significant dehydration with elevated BUN, creatinine and decreased sodium and chloride.  BUN/creatinine ratio today is 7.1/2.8 as compared to previous of 35/1.5.  She appears to have a intravascular volume depletion.  Her vital signs however are stable.  She was given IV fluids in the emergency department.  Patient felt comfortable after treatment and wanted to go home.  She was advised to drink plenty of fluids stay on a low carbohydrate diet and use a warm compress to help her carbuncle of the buttock heal.  Advised to use Lotrimin cream on perineal rash.           Final Clinical Impression(s) / ED Diagnoses Final diagnoses:  None    Rx / DC Orders ED Discharge Orders     None         Mancel BaleWentz, Shirley Bolle, MD 01/29/22 2043

## 2022-01-29 NOTE — ED Triage Notes (Signed)
Patient complains of abscess or pressure wound to left buttocks x 1 week. Reports drainage from same, denies fever, pain to same. Wheelchair bound

## 2022-01-31 NOTE — Progress Notes (Signed)
Remote pacemaker transmission.   

## 2022-02-03 LAB — CULTURE, BLOOD (ROUTINE X 2)
Culture: NO GROWTH
Culture: NO GROWTH
Special Requests: ADEQUATE

## 2022-02-05 ENCOUNTER — Inpatient Hospital Stay (HOSPITAL_COMMUNITY)
Admission: EM | Admit: 2022-02-05 | Discharge: 2022-02-15 | DRG: 571 | Disposition: A | Discharge status: Skilled Nursing Facility | Payer: Medicare HMO | Attending: Internal Medicine | Admitting: Internal Medicine

## 2022-02-05 ENCOUNTER — Emergency Department (HOSPITAL_COMMUNITY): Payer: Medicare HMO

## 2022-02-05 DIAGNOSIS — Z91041 Radiographic dye allergy status: Secondary | ICD-10-CM

## 2022-02-05 DIAGNOSIS — E785 Hyperlipidemia, unspecified: Secondary | ICD-10-CM | POA: Diagnosis present

## 2022-02-05 DIAGNOSIS — E1141 Type 2 diabetes mellitus with diabetic mononeuropathy: Secondary | ICD-10-CM | POA: Diagnosis present

## 2022-02-05 DIAGNOSIS — E11628 Type 2 diabetes mellitus with other skin complications: Principal | ICD-10-CM | POA: Diagnosis present

## 2022-02-05 DIAGNOSIS — L0231 Cutaneous abscess of buttock: Secondary | ICD-10-CM | POA: Diagnosis present

## 2022-02-05 DIAGNOSIS — Z794 Long term (current) use of insulin: Secondary | ICD-10-CM | POA: Diagnosis not present

## 2022-02-05 DIAGNOSIS — E1165 Type 2 diabetes mellitus with hyperglycemia: Secondary | ICD-10-CM | POA: Diagnosis present

## 2022-02-05 DIAGNOSIS — E1152 Type 2 diabetes mellitus with diabetic peripheral angiopathy with gangrene: Secondary | ICD-10-CM | POA: Diagnosis not present

## 2022-02-05 DIAGNOSIS — Z89512 Acquired absence of left leg below knee: Secondary | ICD-10-CM

## 2022-02-05 DIAGNOSIS — N1832 Chronic kidney disease, stage 3b: Secondary | ICD-10-CM | POA: Diagnosis present

## 2022-02-05 DIAGNOSIS — L03317 Cellulitis of buttock: Secondary | ICD-10-CM | POA: Diagnosis present

## 2022-02-05 DIAGNOSIS — B9561 Methicillin susceptible Staphylococcus aureus infection as the cause of diseases classified elsewhere: Secondary | ICD-10-CM | POA: Diagnosis present

## 2022-02-05 DIAGNOSIS — Z79899 Other long term (current) drug therapy: Secondary | ICD-10-CM

## 2022-02-05 DIAGNOSIS — I10 Essential (primary) hypertension: Secondary | ICD-10-CM | POA: Diagnosis not present

## 2022-02-05 DIAGNOSIS — I110 Hypertensive heart disease with heart failure: Secondary | ICD-10-CM | POA: Diagnosis not present

## 2022-02-05 DIAGNOSIS — B964 Proteus (mirabilis) (morganii) as the cause of diseases classified elsewhere: Secondary | ICD-10-CM | POA: Diagnosis present

## 2022-02-05 DIAGNOSIS — L89890 Pressure ulcer of other site, unstageable: Secondary | ICD-10-CM | POA: Diagnosis present

## 2022-02-05 DIAGNOSIS — E039 Hypothyroidism, unspecified: Secondary | ICD-10-CM | POA: Diagnosis present

## 2022-02-05 DIAGNOSIS — J449 Chronic obstructive pulmonary disease, unspecified: Secondary | ICD-10-CM | POA: Diagnosis present

## 2022-02-05 DIAGNOSIS — R823 Hemoglobinuria: Secondary | ICD-10-CM | POA: Diagnosis present

## 2022-02-05 DIAGNOSIS — Z6831 Body mass index (BMI) 31.0-31.9, adult: Secondary | ICD-10-CM | POA: Diagnosis not present

## 2022-02-05 DIAGNOSIS — R1310 Dysphagia, unspecified: Secondary | ICD-10-CM | POA: Diagnosis present

## 2022-02-05 DIAGNOSIS — I5032 Chronic diastolic (congestive) heart failure: Secondary | ICD-10-CM | POA: Diagnosis present

## 2022-02-05 DIAGNOSIS — K219 Gastro-esophageal reflux disease without esophagitis: Secondary | ICD-10-CM | POA: Diagnosis present

## 2022-02-05 DIAGNOSIS — Z8249 Family history of ischemic heart disease and other diseases of the circulatory system: Secondary | ICD-10-CM

## 2022-02-05 DIAGNOSIS — F418 Other specified anxiety disorders: Secondary | ICD-10-CM | POA: Diagnosis not present

## 2022-02-05 DIAGNOSIS — F32A Depression, unspecified: Secondary | ICD-10-CM | POA: Diagnosis present

## 2022-02-05 DIAGNOSIS — E669 Obesity, unspecified: Secondary | ICD-10-CM | POA: Diagnosis present

## 2022-02-05 DIAGNOSIS — G4733 Obstructive sleep apnea (adult) (pediatric): Secondary | ICD-10-CM | POA: Diagnosis present

## 2022-02-05 DIAGNOSIS — D649 Anemia, unspecified: Secondary | ICD-10-CM | POA: Diagnosis present

## 2022-02-05 DIAGNOSIS — L89151 Pressure ulcer of sacral region, stage 1: Principal | ICD-10-CM | POA: Diagnosis present

## 2022-02-05 DIAGNOSIS — L02215 Cutaneous abscess of perineum: Secondary | ICD-10-CM | POA: Diagnosis not present

## 2022-02-05 DIAGNOSIS — Z8673 Personal history of transient ischemic attack (TIA), and cerebral infarction without residual deficits: Secondary | ICD-10-CM | POA: Diagnosis not present

## 2022-02-05 DIAGNOSIS — L03319 Cellulitis of trunk, unspecified: Secondary | ICD-10-CM | POA: Diagnosis not present

## 2022-02-05 DIAGNOSIS — K3184 Gastroparesis: Secondary | ICD-10-CM | POA: Diagnosis present

## 2022-02-05 DIAGNOSIS — E875 Hyperkalemia: Secondary | ICD-10-CM | POA: Diagnosis not present

## 2022-02-05 DIAGNOSIS — F419 Anxiety disorder, unspecified: Secondary | ICD-10-CM | POA: Diagnosis present

## 2022-02-05 DIAGNOSIS — Z79890 Hormone replacement therapy (postmenopausal): Secondary | ICD-10-CM

## 2022-02-05 DIAGNOSIS — F17210 Nicotine dependence, cigarettes, uncomplicated: Secondary | ICD-10-CM | POA: Diagnosis present

## 2022-02-05 DIAGNOSIS — L89320 Pressure ulcer of left buttock, unstageable: Secondary | ICD-10-CM | POA: Diagnosis present

## 2022-02-05 DIAGNOSIS — E1151 Type 2 diabetes mellitus with diabetic peripheral angiopathy without gangrene: Secondary | ICD-10-CM | POA: Diagnosis not present

## 2022-02-05 DIAGNOSIS — Z83438 Family history of other disorder of lipoprotein metabolism and other lipidemia: Secondary | ICD-10-CM

## 2022-02-05 DIAGNOSIS — E1122 Type 2 diabetes mellitus with diabetic chronic kidney disease: Secondary | ICD-10-CM | POA: Diagnosis present

## 2022-02-05 DIAGNOSIS — I130 Hypertensive heart and chronic kidney disease with heart failure and stage 1 through stage 4 chronic kidney disease, or unspecified chronic kidney disease: Secondary | ICD-10-CM | POA: Diagnosis present

## 2022-02-05 DIAGNOSIS — Z888 Allergy status to other drugs, medicaments and biological substances status: Secondary | ICD-10-CM

## 2022-02-05 DIAGNOSIS — Z9981 Dependence on supplemental oxygen: Secondary | ICD-10-CM

## 2022-02-05 DIAGNOSIS — E11622 Type 2 diabetes mellitus with other skin ulcer: Secondary | ICD-10-CM | POA: Diagnosis present

## 2022-02-05 DIAGNOSIS — Z880 Allergy status to penicillin: Secondary | ICD-10-CM

## 2022-02-05 DIAGNOSIS — N179 Acute kidney failure, unspecified: Secondary | ICD-10-CM | POA: Diagnosis present

## 2022-02-05 DIAGNOSIS — E1142 Type 2 diabetes mellitus with diabetic polyneuropathy: Secondary | ICD-10-CM | POA: Diagnosis present

## 2022-02-05 DIAGNOSIS — M7989 Other specified soft tissue disorders: Secondary | ICD-10-CM | POA: Diagnosis not present

## 2022-02-05 DIAGNOSIS — E1143 Type 2 diabetes mellitus with diabetic autonomic (poly)neuropathy: Secondary | ICD-10-CM | POA: Diagnosis present

## 2022-02-05 DIAGNOSIS — Z89511 Acquired absence of right leg below knee: Secondary | ICD-10-CM

## 2022-02-05 DIAGNOSIS — R197 Diarrhea, unspecified: Secondary | ICD-10-CM | POA: Diagnosis not present

## 2022-02-05 DIAGNOSIS — R81 Glycosuria: Secondary | ICD-10-CM | POA: Diagnosis present

## 2022-02-05 DIAGNOSIS — Z7902 Long term (current) use of antithrombotics/antiplatelets: Secondary | ICD-10-CM

## 2022-02-05 DIAGNOSIS — Z820 Family history of epilepsy and other diseases of the nervous system: Secondary | ICD-10-CM

## 2022-02-05 DIAGNOSIS — Z833 Family history of diabetes mellitus: Secondary | ICD-10-CM

## 2022-02-05 DIAGNOSIS — N3001 Acute cystitis with hematuria: Principal | ICD-10-CM

## 2022-02-05 LAB — GLUCOSE, CAPILLARY
Glucose-Capillary: 257 mg/dL — ABNORMAL HIGH (ref 70–99)
Glucose-Capillary: 276 mg/dL — ABNORMAL HIGH (ref 70–99)
Glucose-Capillary: 272 mg/dL — ABNORMAL HIGH (ref 70–99)

## 2022-02-05 LAB — CBC WITH DIFFERENTIAL/PLATELET
Abs Immature Granulocytes: 0.09 10*3/uL — ABNORMAL HIGH (ref 0.00–0.07)
Basophils Absolute: 0 10*3/uL (ref 0.0–0.1)
Basophils Relative: 0 %
Eosinophils Absolute: 0 10*3/uL (ref 0.0–0.5)
Eosinophils Relative: 0 %
HCT: 30.3 % — ABNORMAL LOW (ref 36.0–46.0)
Hemoglobin: 9.6 g/dL — ABNORMAL LOW (ref 12.0–15.0)
Immature Granulocytes: 1 %
Lymphocytes Relative: 11 %
Lymphs Abs: 1.3 10*3/uL (ref 0.7–4.0)
MCH: 27 pg (ref 26.0–34.0)
MCHC: 31.7 g/dL (ref 30.0–36.0)
MCV: 85.4 fL (ref 80.0–100.0)
Monocytes Absolute: 0.7 10*3/uL (ref 0.1–1.0)
Monocytes Relative: 6 %
Neutro Abs: 9.8 10*3/uL — ABNORMAL HIGH (ref 1.7–7.7)
Neutrophils Relative %: 82 %
Platelets: 429 10*3/uL — ABNORMAL HIGH (ref 150–400)
RBC: 3.55 MIL/uL — ABNORMAL LOW (ref 3.87–5.11)
RDW: 14 % (ref 11.5–15.5)
WBC: 11.9 10*3/uL — ABNORMAL HIGH (ref 4.0–10.5)
nRBC: 0 % (ref 0.0–0.2)

## 2022-02-05 LAB — URINALYSIS, ROUTINE W REFLEX MICROSCOPIC
Bilirubin Urine: NEGATIVE
Glucose, UA: >=500 mg/dL — AB
Ketones, ur: NEGATIVE mg/dL
Nitrite: NEGATIVE
Protein, ur: NEGATIVE mg/dL
RBC / HPF: >50 RBC/hpf — ABNORMAL HIGH (ref 0–5)
Specific Gravity, Urine: 1.012 (ref 1.005–1.030)
WBC, UA: >50 WBC/hpf — ABNORMAL HIGH (ref 0–5)
pH: 5 (ref 5.0–8.0)

## 2022-02-05 LAB — COMPREHENSIVE METABOLIC PANEL
ALT: 39 U/L (ref 0–44)
AST: 28 U/L (ref 15–41)
Albumin: 2.6 g/dL — ABNORMAL LOW (ref 3.5–5.0)
Alkaline Phosphatase: 137 U/L — ABNORMAL HIGH (ref 38–126)
Anion gap: 14 (ref 5–15)
BUN: 107 mg/dL — ABNORMAL HIGH (ref 6–20)
CO2: 18 mmol/L — ABNORMAL LOW (ref 22–32)
Calcium: 9.6 mg/dL (ref 8.9–10.3)
Chloride: 103 mmol/L (ref 98–111)
Creatinine, Ser: 2.21 mg/dL — ABNORMAL HIGH (ref 0.44–1.00)
GFR, Estimated: 25 mL/min — ABNORMAL LOW (ref >60)
Glucose, Bld: 275 mg/dL — ABNORMAL HIGH (ref 70–99)
Potassium: 4.8 mmol/L (ref 3.5–5.1)
Sodium: 135 mmol/L (ref 135–145)
Total Bilirubin: 0.7 mg/dL (ref 0.3–1.2)
Total Protein: 7.3 g/dL (ref 6.5–8.1)

## 2022-02-05 LAB — LACTIC ACID, PLASMA: Lactic Acid, Venous: 1.4 mmol/L (ref 0.5–1.9)

## 2022-02-05 LAB — CBG MONITORING, ED: Glucose-Capillary: 266 mg/dL — ABNORMAL HIGH (ref 70–99)

## 2022-02-05 MED ORDER — VANCOMYCIN HCL IN DEXTROSE 1-5 GM/200ML-% IV SOLN
1000.0000 mg | Freq: Once | INTRAVENOUS | Status: AC
  Administered 2022-02-05: 1000 mg via INTRAVENOUS
  Filled 2022-02-05: qty 200

## 2022-02-05 MED ORDER — ACETAMINOPHEN 650 MG RE SUPP: 650.0000 mg | Freq: Four times a day (QID) | RECTAL | Status: DC | PRN

## 2022-02-05 MED ORDER — ONDANSETRON HCL 4 MG PO TABS: 4.0000 mg | ORAL_TABLET | Freq: Four times a day (QID) | ORAL | Status: DC | PRN

## 2022-02-05 MED ORDER — HYDROMORPHONE HCL 1 MG/ML IJ SOLN
1.0000 mg | Freq: Once | INTRAMUSCULAR | Status: AC
  Administered 2022-02-05: 1 mg via INTRAVENOUS
  Filled 2022-02-05: qty 1

## 2022-02-05 MED ORDER — LEVOTHYROXINE SODIUM 50 MCG PO TABS
50.0000 ug | ORAL_TABLET | Freq: Every day | ORAL | Status: DC
  Administered 2022-02-06 – 2022-02-15 (×10): 50 ug via ORAL
  Filled 2022-02-05 (×10): qty 1

## 2022-02-05 MED ORDER — INSULIN ASPART 100 UNIT/ML IJ SOLN
0.0000 [IU] | Freq: Three times a day (TID) | INTRAMUSCULAR | Status: DC
  Administered 2022-02-06 (×2): 5 [IU] via SUBCUTANEOUS
  Administered 2022-02-06: 8 [IU] via SUBCUTANEOUS
  Administered 2022-02-07 (×3): 5 [IU] via SUBCUTANEOUS
  Administered 2022-02-08: 2 [IU] via SUBCUTANEOUS
  Administered 2022-02-08 – 2022-02-09 (×3): 3 [IU] via SUBCUTANEOUS
  Administered 2022-02-09 (×2): 5 [IU] via SUBCUTANEOUS
  Administered 2022-02-10 – 2022-02-12 (×3): 2 [IU] via SUBCUTANEOUS
  Administered 2022-02-12: 3 [IU] via SUBCUTANEOUS
  Administered 2022-02-13 (×2): 2 [IU] via SUBCUTANEOUS
  Administered 2022-02-13: 3 [IU] via SUBCUTANEOUS
  Administered 2022-02-14 – 2022-02-15 (×4): 2 [IU] via SUBCUTANEOUS
  Filled 2022-02-05: qty 0.15
  Filled 2022-02-05: qty 1

## 2022-02-05 MED ORDER — GLIMEPIRIDE 4 MG PO TABS
4.0000 mg | ORAL_TABLET | Freq: Two times a day (BID) | ORAL | Status: DC
  Filled 2022-02-05: qty 1

## 2022-02-05 MED ORDER — CARVEDILOL 3.125 MG PO TABS
3.1250 mg | ORAL_TABLET | Freq: Two times a day (BID) | ORAL | Status: DC
  Administered 2022-02-06 – 2022-02-15 (×18): 3.125 mg via ORAL
  Filled 2022-02-05 (×20): qty 1

## 2022-02-05 MED ORDER — METOCLOPRAMIDE HCL 5 MG PO TABS
10.0000 mg | ORAL_TABLET | Freq: Four times a day (QID) | ORAL | Status: DC
  Administered 2022-02-06 (×3): 10 mg via ORAL
  Filled 2022-02-05 (×5): qty 2

## 2022-02-05 MED ORDER — LACTATED RINGERS IV SOLN: INTRAVENOUS | Status: DC

## 2022-02-05 MED ORDER — SODIUM CHLORIDE 0.9 % IV SOLN
1.0000 g | Freq: Two times a day (BID) | INTRAVENOUS | Status: DC
  Administered 2022-02-06 – 2022-02-08 (×5): 1 g via INTRAVENOUS
  Filled 2022-02-05 (×4): qty 20
  Filled 2022-02-05: qty 1
  Filled 2022-02-05: qty 20

## 2022-02-05 MED ORDER — LACTATED RINGERS IV BOLUS (SEPSIS)
1000.0000 mL | Freq: Once | INTRAVENOUS | Status: AC
  Administered 2022-02-05: 1000 mL via INTRAVENOUS

## 2022-02-05 MED ORDER — SODIUM CHLORIDE 0.9 % IV SOLN
1.0000 g | Freq: Once | INTRAVENOUS | Status: AC
  Administered 2022-02-05: 1 g via INTRAVENOUS
  Filled 2022-02-05: qty 1

## 2022-02-05 MED ORDER — OXYCODONE HCL 5 MG PO TABS
5.0000 mg | ORAL_TABLET | ORAL | Status: DC | PRN
  Administered 2022-02-06: 5 mg via ORAL
  Filled 2022-02-05: qty 1

## 2022-02-05 MED ORDER — DAPAGLIFLOZIN PROPANEDIOL 10 MG PO TABS
10.0000 mg | ORAL_TABLET | Freq: Every day | ORAL | Status: DC
Start: 2022-02-06 — End: 2022-02-06
  Filled 2022-02-05: qty 1

## 2022-02-05 MED ORDER — PANTOPRAZOLE SODIUM 40 MG PO TBEC
40.0000 mg | DELAYED_RELEASE_TABLET | Freq: Every day | ORAL | Status: DC
  Administered 2022-02-07 – 2022-02-15 (×9): 40 mg via ORAL
  Filled 2022-02-05 (×9): qty 1

## 2022-02-05 MED ORDER — VANCOMYCIN HCL IN DEXTROSE 1-5 GM/200ML-% IV SOLN
1000.0000 mg | INTRAVENOUS | Status: AC
  Administered 2022-02-07 – 2022-02-09 (×2): 1000 mg via INTRAVENOUS
  Filled 2022-02-05 (×2): qty 200

## 2022-02-05 MED ORDER — ONDANSETRON HCL 4 MG/2ML IJ SOLN: 4.0000 mg | Freq: Four times a day (QID) | INTRAMUSCULAR | Status: DC | PRN

## 2022-02-05 MED ORDER — ROSUVASTATIN CALCIUM 10 MG PO TABS
40.0000 mg | ORAL_TABLET | Freq: Every day | ORAL | Status: DC
  Administered 2022-02-07 – 2022-02-15 (×9): 40 mg via ORAL
  Filled 2022-02-05 (×9): qty 4

## 2022-02-05 MED ORDER — ENOXAPARIN SODIUM 30 MG/0.3ML IJ SOSY
30.0000 mg | PREFILLED_SYRINGE | INTRAMUSCULAR | Status: DC
Start: 2022-02-05 — End: 2022-02-10
  Administered 2022-02-05 – 2022-02-09 (×4): 30 mg via SUBCUTANEOUS
  Filled 2022-02-05 (×5): qty 0.3

## 2022-02-05 MED ORDER — LACTATED RINGERS IV BOLUS
1000.0000 mL | Freq: Once | INTRAVENOUS | Status: AC
  Administered 2022-02-05: 1000 mL via INTRAVENOUS

## 2022-02-05 MED ORDER — ALBUTEROL SULFATE (2.5 MG/3ML) 0.083% IN NEBU: 2.5000 mg | INHALATION_SOLUTION | Freq: Four times a day (QID) | RESPIRATORY_TRACT | Status: DC | PRN

## 2022-02-05 MED ORDER — ACETAMINOPHEN 325 MG PO TABS: 650.0000 mg | ORAL_TABLET | Freq: Four times a day (QID) | ORAL | Status: DC | PRN

## 2022-02-05 NOTE — Progress Notes (Signed)
Surgery will evaluate in AM for possible debridement. Please keep her NPO after midnight.   You may use chemical VTE prophylaxis but no long acting anti coagulation.   Manus Rudd, MD

## 2022-02-05 NOTE — ED Provider Notes (Cosign Needed Addendum)
[facility]-EMERGENCY DEPT Provider Note   CSN: [id number] Arrival date & time: 02/05/22  1035     History  Chief Complaint  Patient presents with   Skin Ulcer   Hyperglycemia    [name] is a 61 y.o. female.  61 year old female with a past medical history of DM,COPD, cellulitis presents to the ED with a chief complaint of weakness.  Patient reports she has felt unwell for the past 3 weeks, evaluated in the emergency department 7 days ago and sent home after reassuring work-up.  On today's visit she reports she has not been wanting to get out of bed, she does ambulate although she has a bilateral BKA.  She also endorses some urinary frequency, states "I just been peeing so much more ".  She also endorses some dysphagia, states that she feels constant thirst.  Her blood sugars have been running along the normal range around 200.  She does have a known decubitus ulcer, however this was noted to be larger today by home health nurse as patient had a decline in mental status.  Denies any abdominal pain, fever, other complaints.  The history is provided by the patient and medical records.  Hyperglycemia Associated symptoms: weakness   Associated symptoms: no abdominal pain, no chest pain, no fever, no nausea, no shortness of breath and no vomiting        Home Medications Prior to Admission medications   Medication Sig Start Date End Date Taking? Authorizing Provider  carvedilol (COREG) 3.125 MG tablet Take 1 tablet (3.125 mg total) by mouth 2 (two) times daily with a meal. 10/25/15   Rolly Salter, MD  clopidogrel (PLAVIX) 75 MG tablet Take 1 tablet (75 mg total) by mouth daily. 07/24/16   Ghimire, Werner Lean, MD  EASY COMFORT LANCETS MISC test blood sugar THREE TIMES DAILY 09/23/16   [provider]  fenofibrate 160 MG tablet Take 1 tablet (160 mg total) by mouth daily. 07/24/16   Ghimire, Werner Lean, MD  FLUoxetine (PROZAC) 10 MG capsule Take 10 mg by  mouth daily.    [provider]  furosemide (LASIX) 40 MG tablet Take 0.5 tablets (20 mg total) daily as needed by mouth. 06/26/17   Leatha Gilding, MD  HYDROcodone-acetaminophen (NORCO/VICODIN) 5-325 MG tablet Take 1 tablet by mouth every 4 (four) hours as needed for severe pain. 02/27/20   Walisiewicz, Kaitlyn E, PA-C  insulin aspart (NOVOLOG) 100 UNIT/ML injection Inject 15 Units 3 (three) times daily with meals into the skin. Patient taking differently: Inject 0-38 Units into the skin 3 (three) times daily with meals. Sliding scale 06/26/17   Leatha Gilding, MD  levothyroxine (SYNTHROID, LEVOTHROID) 50 MCG tablet Take 50 mcg by mouth daily before breakfast.    [provider]  methocarbamol (ROBAXIN) 500 MG tablet Take 1 tablet (500 mg total) by mouth 2 (two) times daily. 02/27/20   Walisiewicz, Caroleen Hamman, PA-C  metoCLOPramide (REGLAN) 10 MG tablet Take 10 mg by mouth 4 (four) times daily. Take 1 tablet (10 mg) by mouth 30 minutes before meals and at bedtime 11/25/15   [provider]  nystatin cream (MYCOSTATIN) Apply to affected area 2 times daily x 14 days 09/04/21   Fayrene Helper, PA-C  oxybutynin (DITROPAN XL) 15 MG 24 hr tablet Take 15 mg by mouth at bedtime.    [provider]  pantoprazole (PROTONIX) 40 MG tablet Take 40 mg by mouth daily. 09/23/18   [provider]  rosuvastatin (CRESTOR) 40 MG tablet Take 40 mg by mouth daily.    [provider]  TRESIBA FLEXTOUCH 200 UNIT/ML SOPN Inject 120 Units into the skin daily.  09/23/18   [provider]      Allergies    Penicillins, Dulaglutide, Erythromycin, Gadolinium, Iodine-131, Ivp dye [iodinated contrast media], and Metformin    Review of Systems   Review of Systems  Constitutional:  Negative for chills and fever.  HENT:  Negative for sore throat.   Respiratory:  Negative for shortness of breath.   Cardiovascular:  Negative for chest pain.  Gastrointestinal:  Negative for  abdominal pain, nausea and vomiting.  Genitourinary:  Positive for frequency.  Skin:  Positive for wound.  Neurological:  Positive for weakness. Negative for light-headedness and headaches.  All other systems reviewed and are negative.   Physical Exam Updated Vital Signs BP (!) 131/37   Pulse 66   Temp 97.7 F (36.5 C) (Oral)   Resp 18   SpO2 100%  Physical Exam Vitals and nursing note reviewed.  Constitutional:      Appearance: Normal appearance. She is obese.  HENT:     Head: Normocephalic and atraumatic.     Mouth/Throat:     Mouth: Mucous membranes are dry.     Comments: Lips appear dry, oropharynx is very dry and pale. Eyes:     Pupils: Pupils are equal, round, and reactive to light.  Cardiovascular:     Rate and Rhythm: Normal rate.  Pulmonary:     Effort: Pulmonary effort is normal.  Abdominal:     General: Abdomen is flat. Bowel sounds are normal.     Palpations: Abdomen is soft.     Tenderness: There is no abdominal tenderness.     Comments: Abdomen is soft nontender to palpation throughout.  Musculoskeletal:     Cervical back: Normal range of motion and neck supple.     Right Lower Extremity: Right leg is amputated below knee.     Left Lower Extremity: Left leg is amputated below knee.  Skin:    General: Skin is warm and dry.     Findings: Erythema present.  Neurological:     Mental Status: She is alert and oriented to person, place, and time.     ED Results / Procedures / Treatments   Labs (all labs ordered are listed, but only abnormal results are displayed) Labs Reviewed  CBC WITH DIFFERENTIAL/PLATELET - Abnormal; Notable for the following components:      Result Value   WBC 11.9 (*)    RBC 3.55 (*)    Hemoglobin 9.6 (*)    HCT 30.3 (*)    Platelets 429 (*)    Neutro Abs 9.8 (*)    Abs Immature Granulocytes 0.09 (*)    All other components within normal limits  COMPREHENSIVE METABOLIC PANEL - Abnormal; Notable for the following components:    CO2 18 (*)    Glucose, Bld 275 (*)    BUN 107 (*)    Creatinine, Ser 2.21 (*)    Albumin 2.6 (*)    Alkaline Phosphatase 137 (*)    GFR, Estimated 25 (*)    All other components within normal limits  URINALYSIS, ROUTINE W REFLEX MICROSCOPIC - Abnormal; Notable for the following components:   APPearance CLOUDY (*)    Glucose, UA >=500 (*)    Hgb urine dipstick SMALL (*)    Leukocytes,Ua LARGE (*)    RBC / HPF >50 (*)  WBC, UA >50 (*)    Bacteria, UA MANY (*)    All other components within normal limits  CBG MONITORING, ED - Abnormal; Notable for the following components:   Glucose-Capillary 266 (*)    All other components within normal limits  LACTIC ACID, PLASMA  LACTIC ACID, PLASMA    EKG None  Radiology CT PELVIS WO CONTRAST  Result Date: 02/05/2022 CLINICAL DATA:  Perianal abscess or fistula suspected EXAM: CT PELVIS WITHOUT CONTRAST TECHNIQUE: Multidetector CT imaging of the pelvis was performed following the standard protocol without intravenous contrast. RADIATION DOSE REDUCTION: This exam was performed according to the departmental dose-optimization program which includes automated exposure control, adjustment of the mA and/or kV according to patient size and/or use of iterative reconstruction technique. COMPARISON:  CT pelvis 10/16/2018 FINDINGS: Limited evaluation due to lack of IV contrast. Urinary Tract: Mild urinary bladder wall thickening with no focal mass identified. Bowel: No evidence of bowel obstruction. No bowel wall edema visualized. Appendix is normal. Vascular/Lymphatic: Moderate atherosclerotic disease. No bulky lymphadenopathy visualized. Reproductive: Uterus appears grossly unremarkable. No suspicious adnexal mass identified. Mild subcutaneous edema and skin thickening throughout the left labial region extending from the gluteal region. Other: Extensive subcutaneous soft tissue edema and thickening throughout the left gluteal region. The edema measures up to  approximately 2.2 cm in depth from the skin surface and is most significant along the inferior half of the left gluteus where there are numerous accompanying foci of subcutaneous air. There is also irregular somewhat tract like edema/density extending medially towards the anus. No definite defined fluid collection identified, although significantly limited evaluation without IV contrast. Musculoskeletal: No acute osseous abnormality identified. IMPRESSION: 1. Extensive soft tissue edema and inflammation throughout the left gluteal region including several foci of subcutaneous air. Correlate for infection/severe cellulitis. There is also irregular tract like density extending towards the anus which could potentially represent a fistula, although not well evaluated. 2. No definite defined collection/abscess identified, although significantly limited evaluation without IV contrast. 3. Mild urinary bladder wall thickening noted which is nonspecific, correlate with urinalysis. Electronically Signed   By: Jannifer Hick M.D.   On: 02/05/2022 13:40    Procedures Procedures    Medications Ordered in ED Medications  vancomycin (VANCOCIN) IVPB 1000 mg/200 mL premix (has no administration in time range)  meropenem (MERREM) 1 g in sodium chloride 0.9 % 100 mL IVPB (has no administration in time range)    ED Course/ Medical Decision Making/ A&P Clinical Course as of 02/05/22 1428  Sun Feb 05, 2022  1359 Leukocytes,Ua(!): LARGE [JS]  1359 WBC, UA(!): >50 [JS]  1359 Bacteria, UA(!): MANY [JS]  1410 WBC(!): 11.9 [JS]    Clinical Course User Index [JS] Claude Manges, PA-C                           Medical Decision Making Amount and/or Complexity of Data Reviewed Labs: ordered. Decision-making details documented in ED Course. Radiology: ordered.  Risk Prescription drug management.   This patient presents to the ED for concern of weakness, this involves a number of treatment options, and is a  complaint that carries with it a high risk of complications and morbidity.  The differential diagnosis includes infection, electrolyte derangement, deconditioning.   Co morbidities: Discussed in HPI   Brief History:  Patient with prior bilateral BKA presents to the ED with generalized weakness for the last several days.  Endorsing some pain along her abdomen, urinary  frequency.  EMR reviewed including pt PMHx, past surgical history and past visits to ER.   See HPI for more details   Lab Tests:  I ordered and independently interpreted labs.  The pertinent results include:    Labs notable for cytosis of 11.9, hemoglobin slightly decreased but within her baseline.  CMP with no electrolyte derangement, creatinine level is elevated at 2.21 however within her prior baseline.  Blood sugar checked on arrival was 266.  UA remarkable for many bacteria greater than 50 white blood cell count and large leukocytes likely consistent with urinary tract infection.   Imaging Studies: CT pelvis showed: 1. Extensive soft tissue edema and inflammation throughout the left  gluteal region including several foci of subcutaneous air. Correlate  for infection/severe cellulitis. There is also irregular tract like  density extending towards the anus which could potentially represent  a fistula, although not well evaluated.  2. No definite defined collection/abscess identified, although  significantly limited evaluation without IV contrast.  3. Mild urinary bladder wall thickening noted which is nonspecific,  correlate with urinalysis.    Cardiac Monitoring:  N/A  Medicines ordered:  I ordered medication including pain Vancomycin, Meropenem for treatment of cellulitis. Reevaluation of the patient after these medicines showed that the patient stayed the same I have reviewed the patients home medicines and have made adjustments as needed   Critical Interventions:  Initiation of antibiotics to treat  cellulitis.  No signs of sepsis at this time, however patient with some deconditioning in the last couple of days.   Consults:  I requested consultation with Dr. Corliss Skains,  and discussed lab and imaging findings as well as pertinent plan - they recommend: Admission via hospitalist, will need to be n.p.o. after midnight.  They will assess patient in the morning whether debridement is needed.    Reevaluation:  After the interventions noted above I re-evaluated patient and found that they have :stayed the same   Social Determinants of Health:   Has home health care at home, however exacerbation of her condition requiring admission.    Problem List / ED Course:  Patient here with generalized weakness for the past several weeks, evaluated previously but returns today due to not feeling much better.  Some urinary frequency also reported, does have a known decubitus ulcer to her right buttocks.  Today with an elevated white count of 11.9, UA with signs of infection.  CT obtain showed likely severe cellulitis.  Discussed case with general surgery who will evaluate patient tomorrow morning for perhaps debridement.  She was started on biotics to help with infection at this time.  Will call consult to hospitalist for further recommendations. Vitals are stable considered sepsis but lactic negative, afebrile.    Dispostion:  After consideration of the diagnostic results and the patients response to treatment, I feel that the patent would benefit from admission into the hospital due to worsening weakness.  2:28 PM Spoke to Dr. Robb Matar hospitalist who agrees with admission into the hospital at this time.  Patient hemodynamically stable for admission.    Portions of this note were generated with Scientist, clinical (histocompatibility and immunogenetics). Dictation errors may occur despite best attempts at proofreading.   Final Clinical Impression(s) / ED Diagnoses Final diagnoses:  Acute cystitis with hematuria  Cellulitis and  abscess of buttock    Rx / DC Orders ED Discharge Orders     None         Claude Manges, PA-C 02/05/22 1428    Miranda Frese,  Golden Hurter 02/05/22 1429    Mancel Bale, MD 02/06/22 1406

## 2022-02-05 NOTE — H&P (Addendum)
History and Physical    Patient: [name] PNT:[id number] DOB: 27-Apr-1961 DOA: 02/05/2022 DOS: the patient was seen and examined on 02/05/2022 PCP: Loyal Jacobson, MD  Patient coming from: Home  Chief Complaint:  Chief Complaint  Patient presents with   Skin Ulcer   Hyperglycemia   HPI: [name] is a 61 y.o. female with medical history significant of anxiety, depression, history of cellulitis, chronic diastolic heart failure, cigarette smoker, concussion, COPD, GERD, hyperlipidemia, hypertension, hypothyroidism, history of MRSA positive culture, OSA on CPAP, peripheral vascular disease, symptomatic bradycardia, TIA, type 2 diabetes, diabetic peripheral neuropathy, obesity who is coming to the emergency department due to worsening sacral ulcer for the past 3 weeks which has been draining a foul-smelling purulent discharge.  She has had chills, but no fever.  She has been very fatigued and has been avoiding getting out of bed.  Home health nurse stated that the patient has had a decline in mental status.  She has been feeling dysphagia because her mouth feels dry but has been having polyuria and polydipsia as well.  She stated that recently her blood glucose was high as in the 530s and her usual measurements is between 200 and 300.  She has been having dyspnea, occasionally productive cough and wheezing.  No chest or abdominal pain.  No dysuria, flank pain or hematuria.  ED course: Initial vital signs were temperature 97.7 F, pulse 83, respiration 18, BP 1 2 over 52 mmHg O2 sat 99% on room air.  The patient received meropenem and vancomycin in the emergency department.  General surgery was consulted.  They may I&D infected area in the morning.  Lab work: Urinalysis is cloudy with glucosuria more than 500 mg/dL, small hemoglobinuria with large leukocyte esterase, more than 50 WBC more than 50 RBC and many bacteria on microscopic examination along with budding yeast.  Lactic acid was  normal.  CBC with a white count 11.9, hemoglobin 9.6 g/dL platelets 086.  CMP showed normal electrolytes except for CO2 18 mmol/L.  Anion gap was normal.  Glucose 275, BUN 107 and creatinine 2.21 mg/dL.  Imaging: CT pelvis with contrast show extensive soft tissue edema and inflammation throughout the left gluteal region including several foci of subcutaneous air.  Correlate for infection/severe cellulitis.  There is also irregular tract like density extending towards the anus which could potentially represent a fistula although not well evaluated.  No definite collection/abscess identified, although evaluation is limited due to no IV contrast.  There is nonspecific mild urinary bladder wall thickening could be correlated with urinalysis.   Review of Systems: As mentioned in the history of present illness. All other systems reviewed and are negative.  Past Medical History:  Diagnosis Date   Anxiety    Cellulitis    Chronic diastolic (congestive) heart failure (HCC)    Complication of anesthesia 01/2013   "didn't know where I was; who I was; talking out the top of my head" (04/21/2013)   Concussion as a teenager   slight   COPD (chronic obstructive pulmonary disease) (HCC)    wear oxygen at home   Depression    Diabetic neuropathy (HCC)    GERD (gastroesophageal reflux disease)    Hyperlipidemia    Hypertension    Hypothyroidism    takes Synthroid daily   MRSA (methicillin resistant staph aureus) culture positive    hx of 2012   Neuromuscular disorder (HCC)    OSA on CPAP    and oxygen  Peripheral vascular disease ([place])    Staph infection 2007   Symptomatic bradycardia 03/04/2014   TIA (transient ischemic attack)    Type II diabetes mellitus [facility])    Past Surgical History:  Procedure Laterality Date   AMPUTATION Left 06/22/2017   Procedure: LEFT BELOW KNEE AMPUTATION;  Surgeon: Newt Minion, MD;  Location: [place];  Service: Orthopedics;  Laterality: Left;   BLADDER SURGERY  1970's    "stretched the mouth of my bladder" (04/21/2013)   DILATION AND CURETTAGE OF UTERUS  1980's   FOOT SURGERY Right 2012   "put pins in one year; amputated toes another OR" (04/21/2013)   GANGLION CYST EXCISION Left 1990's   "wrist" (04/21/2013)   I & D EXTREMITY Left 08/25/2013   Procedure: IRRIGATION AND DEBRIDEMENT EXTREMITY;  Surgeon: Ralene Ok, MD;  Location: [place];  Service: General;  Laterality: Left;   INCISION AND DRAINAGE ABSCESS N/A 01/24/2013   Procedure: INCISION AND DRAINAGE ABSCESS;  Surgeon: Joyice Faster. Cornett, MD;  Location: [place];  Service: General;  Laterality: N/A;   INGUINAL HERNIA REPAIR N/A 01/28/2013   Procedure: I&D Right Groin Wound;  Surgeon: Harl Bowie, MD;  Location: [place];  Service: General;  Laterality: N/A;   LEG AMPUTATION BELOW KNEE Right 2012   PERMANENT PACEMAKER INSERTION N/A 03/05/2014   Biotronik dual chamber pacemaker implanted by Dr Caryl Comes for symptomatic bradycardia   REFRACTIVE SURGERY Bilateral 2014   STUMP REVISION Right 12/11/2014   Procedure: Right Below Knee Amputation Revision;  Surgeon: Newt Minion, MD;  Location: [place];  Service: Orthopedics;  Laterality: Right;   TOE AMPUTATION Left ?2011    only 2 toes remaning.    TUBAL LIGATION  1990's   WRIST SURGERY Right 1980's   "pinched nerve" (04/21/2013)   Social History:  reports that she has been smoking cigarettes. She has a 16.50 pack-year smoking history. She has never used smokeless tobacco. She reports that she does not drink alcohol and does not use drugs.  Allergies  Allergen Reactions   Penicillins Hives    Has patient had a PCN reaction causing immediate rash, facial/tongue/throat swelling, SOB or lightheadedness with hypotension: No Has patient had a PCN reaction causing severe rash involving mucus membranes or skin necrosis: No Has patient had a PCN reaction that required hospitalization No Has patient had a PCN reaction occurring within the last 10 years: No If all of the  above answers are "NO", then may proceed with Cephalosporin use.    Dulaglutide Diarrhea   Erythromycin Diarrhea   Gadolinium Nausea And Vomiting     Code: VOM, Desc: Pt began vomiting immed post infusion of multihance, Onset Date: [id number]    Iodine-131 Nausea And Vomiting   Ivp Dye [Iodinated Contrast Media] Nausea And Vomiting        Metformin Diarrhea    Family History  Problem Relation Age of Onset   Hypertension Mother    Hyperlipidemia Mother    Hypertrophic cardiomyopathy Mother        has pacer   Alzheimer's disease Father    Hypertension Brother    Hyperlipidemia Brother    Cancer Maternal Aunt    Diabetes Paternal Aunt    Diabetes Paternal Uncle    Diabetes Paternal Grandmother    Anesthesia problems Neg Hx    Hypotension Neg Hx    Malignant hyperthermia Neg Hx    Pseudochol deficiency Neg Hx     Prior to Admission medications   Medication Sig  Start Date End Date Taking? Authorizing Provider  carvedilol (COREG) 3.125 MG tablet Take 1 tablet (3.125 mg total) by mouth 2 (two) times daily with a meal. 10/25/15   Lavina Hamman, MD  clopidogrel (PLAVIX) 75 MG tablet Take 1 tablet (75 mg total) by mouth daily. 07/24/16   Ghimire, Henreitta Leber, MD  Cyanocobalamin (B-12) 1000 MCG TABS Take 1,000 mcg by mouth daily. 01/22/22   [provider]  D3 HIGH POTENCY 125 MCG (5000 UT) capsule Take 5,000 Units by mouth daily. 01/22/22   [provider]  EASY COMFORT LANCETS MISC test blood sugar THREE TIMES DAILY 09/23/16   [provider]  FARXIGA 10 MG TABS tablet Take 10 mg by mouth daily. 01/24/22   [provider]  fenofibrate 160 MG tablet Take 1 tablet (160 mg total) by mouth daily. 07/24/16   Ghimire, Henreitta Leber, MD  FLUoxetine (PROZAC) 10 MG capsule Take 10 mg by mouth daily.    [provider]  furosemide (LASIX) 40 MG tablet Take 0.5 tablets (20 mg total) daily as needed by mouth. 06/26/17   Caren Griffins, MD  glimepiride (AMARYL) 4 MG  tablet Take 4 mg by mouth 2 (two) times daily. 01/22/22   [provider]  HYDROcodone-acetaminophen (NORCO/VICODIN) 5-325 MG tablet Take 1 tablet by mouth every 4 (four) hours as needed for severe pain. 02/27/20   Walisiewicz, Kaitlyn E, PA-C  insulin aspart (NOVOLOG) 100 UNIT/ML injection Inject 15 Units 3 (three) times daily with meals into the skin. Patient taking differently: Inject 0-38 Units into the skin 3 (three) times daily with meals. Sliding scale 06/26/17   Caren Griffins, MD  levothyroxine (SYNTHROID, LEVOTHROID) 50 MCG tablet Take 50 mcg by mouth daily before breakfast.    [provider]  lisinopril (ZESTRIL) 2.5 MG tablet Take 2.5 mg by mouth daily. 01/22/22   [provider]  methocarbamol (ROBAXIN) 500 MG tablet Take 1 tablet (500 mg total) by mouth 2 (two) times daily. 02/27/20   Walisiewicz, Harley Hallmark, PA-C  metoCLOPramide (REGLAN) 10 MG tablet Take 10 mg by mouth 4 (four) times daily. Take 1 tablet (10 mg) by mouth 30 minutes before meals and at bedtime 11/25/15   [provider]  MYRBETRIQ 50 MG TB24 tablet Take 50 mg by mouth daily. 01/22/22   [provider]  nystatin cream (MYCOSTATIN) Apply to affected area 2 times daily x 14 days 09/04/21   Domenic Moras, PA-C  oxybutynin (DITROPAN XL) 15 MG 24 hr tablet Take 15 mg by mouth at bedtime.    [provider]  OZEMPIC, 0.25 OR 0.5 MG/DOSE, 2 MG/1.5ML SOPN Inject 0.5 mg into the skin once a week. 09/06/21   [provider]  pantoprazole (PROTONIX) 40 MG tablet Take 40 mg by mouth daily. 09/23/18   [provider]  rosuvastatin (CRESTOR) 40 MG tablet Take 40 mg by mouth daily.    [provider]  TRESIBA FLEXTOUCH 200 UNIT/ML SOPN Inject 120 Units into the skin daily.  09/23/18   [provider]  VICTOZA 18 MG/3ML SOPN SMARTSIG:0.3 Milliliter(s) SUB-Q Daily 12/21/21   [provider]    Physical Exam: Vitals:   02/05/22 1046 02/05/22 1236 02/05/22  1508 02/05/22 1556  BP: (!) 112/52 (!) 131/37  (!) 123/42  Pulse: 83 66  68  Resp: 18 18  18   Temp: 97.7 F (36.5 C)   99.1 F (37.3 C)  TempSrc: Oral   Oral  SpO2: 99% 100%  96%  Weight:   90.4 kg    Physical Exam Vitals and nursing note reviewed.  Constitutional:      Appearance: She is obese. She is ill-appearing. She is not diaphoretic.  HENT:     Head: Normocephalic.     Mouth/Throat:     Mouth: Mucous membranes are dry.  Eyes:     General: No scleral icterus.    Pupils: Pupils are equal, round, and reactive to light.  Neck:     Vascular: No JVD.  Cardiovascular:     Rate and Rhythm: Normal rate and regular rhythm.     Heart sounds: S1 normal and S2 normal.  Pulmonary:     Effort: Pulmonary effort is normal.     Breath sounds: Normal breath sounds. No wheezing or rhonchi.  Abdominal:     General: Bowel sounds are normal. There is no distension.     Palpations: Abdomen is soft.     Tenderness: There is no abdominal tenderness. There is no right CVA tenderness, left CVA tenderness or guarding.  Musculoskeletal:     Cervical back: Neck supple.     Right lower leg: No edema.     Left lower leg: No edema.     Right Lower Extremity: Right leg is amputated below knee.     Left Lower Extremity: Left leg is amputated below knee.  Skin:    General: Skin is warm and dry.     Findings: Erythema and wound present.     Comments: Sacral wound with foul-smelling purulent discharge.  Please see image below.  Hold lisinopril. Parenteral hold lisinopril  Neurological:     General: No focal deficit present.     Mental Status: She is alert and oriented to person, place, and time.  Psychiatric:        Mood and Affect: Mood normal.        Behavior: Behavior normal.      Data Reviewed:  There are no new results to review at this time.  Assessment and Plan: Principal Problem:   Cellulitis of sacral region Admit to telemetry/inpatient. Analgesics as needed. Continue IV  fluids. Continue meropenem per pharmacy. Continue vancomycin per pharmacy. General surgery may I&D in the morning.  Active Problems:   AKI (acute kidney injury) (HCC) Superimposed on:   Stage 3b chronic kidney disease (CKD) (HCC) Observation/telemetry. Continue IV fluids. Hold ARB/ACE. Hold diuretic. Avoid hypotension. Avoid nephrotoxins. Monitor intake and output. Monitor renal function electrolytes.    HTN (hypertension) Hold lisinopril and furosemide. Hydralazine, CCB or beta-blocker as needed.    Hypothyroidism Continue levothyroxine 50 mcg p.o. daily.    Type 2 diabetes mellitus with hyperglycemia (HCC) Not sure how much insulin she is taking. She was recently started on glipizide and Farxiga. Will resume these 2 agents. Continue IV fluids. CBG monitoring before meals and bedtime.    Depression Continue fluoxetine pending med reconciliation.    Chronic diastolic heart failure (HCC) The patient is volume depleted. Holding diuretic and lisinopril. Continue carvedilol.    OSA on CPAP with oxygen CPAP at bedtime.    COPD (chronic obstructive pulmonary disease), on home O2 Supplemental oxygen and bronchodilators as needed.    GERD (gastroesophageal reflux disease) Pantoprazole 40 mg p.o. daily.    Hyperlipidemia On fenofibrate.      Advance Care Planning:   Code Status: Full Code   Consults: General Surgery Manus Rudd MD).  Family Communication:   Severity of Illness: The appropriate patient status for this patient  is INPATIENT. Inpatient status is judged to be reasonable and necessary in order to provide the required intensity of service to ensure the patient's safety. The patient's presenting symptoms, physical exam findings, and initial radiographic and laboratory data in the context of their chronic comorbidities is felt to place them at high risk for further clinical deterioration. Furthermore, it is not anticipated that the patient will be  medically stable for discharge from the hospital within 2 midnights of admission.   * I certify that at the point of admission it is my clinical judgment that the patient will require inpatient hospital care spanning beyond 2 midnights from the point of admission due to high intensity of service, high risk for further deterioration and high frequency of surveillance required.*  Author: Bobette Mo, MD 02/05/2022 5:08 PM  For on call review [email or web address].   This document was prepared using Dragon voice recognition software and may contain some unintended transcription errors.

## 2022-02-05 NOTE — Progress Notes (Signed)
Pt refused cpap tonight. Encouraged to let RN know if she changes her mind.

## 2022-02-05 NOTE — Progress Notes (Signed)
Pharmacy Antibiotic Note  [name] is a 61 y.o. female admitted on 02/05/2022 with weakness.  Patient has a PMH including bilateral BKA, DM, and cellulitis.  Noted by home health nurse that known decubitus ulcer is larger. Pharmacy has been consulted for vancomycin and meropenem dosing for cellulitis.  CT pelvis showed extensive soft tissue edema and inflammation into the left gluteal region with several foci of subcutaneous air.  Surgery following and planning to assess 6/19 for possible debridement.   Noted penicillin allergy, however has received doses of multiple cephalosporins, ertapenem, and pip/tazo in the past.  Scr 2.21 (unsure of current baseline, but value is decreased from 2.85 on 01/29/22), WBC 11.9, currently afebrile. Last height documented at 65 in. CrCl ~ 30 mL/min.  Plan: Vanc 1g IV x1 given in ED, followed by 1000mg  IV q48 hours (eAUC 449, Scr 2.21, Vd 0.5) Meropenem 1g IV q12 hours (for CrCl 10-30 mL/min) F/u clinical improvement, surgery plans, ability to narrow antibiotics    Temp (24hrs), Avg:97.7 F (36.5 C), Min:97.7 F (36.5 C), Max:97.7 F (36.5 C)  Recent Labs  Lab 01/29/22 1458 01/29/22 1658 02/05/22 1149 02/05/22 1315  WBC 10.9*  --  11.9*  --   CREATININE 2.85*  --  2.21*  --   LATICACIDVEN 2.0* 1.5  --  1.4    CrCl cannot be calculated (Unknown ideal weight.).    Allergies  Allergen Reactions   Penicillins Hives    Has patient had a PCN reaction causing immediate rash, facial/tongue/throat swelling, SOB or lightheadedness with hypotension: No Has patient had a PCN reaction causing severe rash involving mucus membranes or skin necrosis: No Has patient had a PCN reaction that required hospitalization No Has patient had a PCN reaction occurring within the last 10 years: No If all of the above answers are "NO", then may proceed with Cephalosporin use.    Dulaglutide Diarrhea   Erythromycin Diarrhea   Gadolinium Nausea And Vomiting      Code: VOM, Desc: Pt began vomiting immed post infusion of multihance, Onset Date: 02/07/22    Iodine-131 Nausea And Vomiting   Ivp Dye [Iodinated Contrast Media] Nausea And Vomiting        Metformin Diarrhea    Antimicrobials this admission: Vancomycin 6/18 >>  Meropenem 6/18 >>   Dose adjustments this admission:  Microbiology results: None ordered thus far  Thank you for allowing pharmacy to be a part of this patient's care.  7/18, PharmD 02/05/2022 2:40 PM

## 2022-02-05 NOTE — ED Triage Notes (Signed)
Per EMS- patient has a decubitus ulcer on the coccyx and right buttock. Home health nurse called EMS because patient was more lethargic than usual. Patient is a BKA and uses prosthetics, but home health nurse reports that the patient has been too weak to ambulate.

## 2022-02-06 ENCOUNTER — Other Ambulatory Visit: Payer: Self-pay

## 2022-02-06 ENCOUNTER — Inpatient Hospital Stay (HOSPITAL_COMMUNITY): Payer: Medicare HMO | Admitting: Anesthesiology

## 2022-02-06 ENCOUNTER — Encounter (HOSPITAL_COMMUNITY): Payer: Self-pay | Admitting: Internal Medicine

## 2022-02-06 ENCOUNTER — Encounter (HOSPITAL_COMMUNITY)
Admission: EM | Disposition: A | Discharge status: Skilled Nursing Facility | Payer: Self-pay | Source: Home / Self Care | Attending: Internal Medicine

## 2022-02-06 DIAGNOSIS — I5032 Chronic diastolic (congestive) heart failure: Secondary | ICD-10-CM | POA: Diagnosis not present

## 2022-02-06 DIAGNOSIS — E1151 Type 2 diabetes mellitus with diabetic peripheral angiopathy without gangrene: Secondary | ICD-10-CM

## 2022-02-06 DIAGNOSIS — M7989 Other specified soft tissue disorders: Secondary | ICD-10-CM | POA: Diagnosis not present

## 2022-02-06 DIAGNOSIS — L03319 Cellulitis of trunk, unspecified: Secondary | ICD-10-CM | POA: Diagnosis not present

## 2022-02-06 DIAGNOSIS — I110 Hypertensive heart disease with heart failure: Secondary | ICD-10-CM | POA: Diagnosis not present

## 2022-02-06 DIAGNOSIS — Z794 Long term (current) use of insulin: Secondary | ICD-10-CM

## 2022-02-06 HISTORY — PX: INCISION AND DRAINAGE ABSCESS: SHX5864

## 2022-02-06 LAB — CBC
HCT: 26 % — ABNORMAL LOW (ref 36.0–46.0)
Hemoglobin: 8.2 g/dL — ABNORMAL LOW (ref 12.0–15.0)
MCH: 27.4 pg (ref 26.0–34.0)
MCHC: 31.5 g/dL (ref 30.0–36.0)
MCV: 87 fL (ref 80.0–100.0)
Platelets: 368 10*3/uL (ref 150–400)
RBC: 2.99 MIL/uL — ABNORMAL LOW (ref 3.87–5.11)
RDW: 14 % (ref 11.5–15.5)
WBC: 11.3 10*3/uL — ABNORMAL HIGH (ref 4.0–10.5)
nRBC: 0 % (ref 0.0–0.2)

## 2022-02-06 LAB — GLUCOSE, CAPILLARY
Glucose-Capillary: 291 mg/dL — ABNORMAL HIGH (ref 70–99)
Glucose-Capillary: 271 mg/dL — ABNORMAL HIGH (ref 70–99)
Glucose-Capillary: 210 mg/dL — ABNORMAL HIGH (ref 70–99)
Glucose-Capillary: 246 mg/dL — ABNORMAL HIGH (ref 70–99)
Glucose-Capillary: 293 mg/dL — ABNORMAL HIGH (ref 70–99)

## 2022-02-06 LAB — COMPREHENSIVE METABOLIC PANEL
ALT: 31 U/L (ref 0–44)
AST: 22 U/L (ref 15–41)
Albumin: 2.2 g/dL — ABNORMAL LOW (ref 3.5–5.0)
Alkaline Phosphatase: 131 U/L — ABNORMAL HIGH (ref 38–126)
Anion gap: 12 (ref 5–15)
BUN: 87 mg/dL — ABNORMAL HIGH (ref 6–20)
CO2: 19 mmol/L — ABNORMAL LOW (ref 22–32)
Calcium: 9.1 mg/dL (ref 8.9–10.3)
Chloride: 104 mmol/L (ref 98–111)
Creatinine, Ser: 2.19 mg/dL — ABNORMAL HIGH (ref 0.44–1.00)
GFR, Estimated: 25 mL/min — ABNORMAL LOW (ref >60)
Glucose, Bld: 271 mg/dL — ABNORMAL HIGH (ref 70–99)
Potassium: 4.8 mmol/L (ref 3.5–5.1)
Sodium: 135 mmol/L (ref 135–145)
Total Bilirubin: 0.9 mg/dL (ref 0.3–1.2)
Total Protein: 6.4 g/dL — ABNORMAL LOW (ref 6.5–8.1)

## 2022-02-06 LAB — MRSA NEXT GEN BY PCR, NASAL: MRSA by PCR Next Gen: NOT DETECTED

## 2022-02-06 LAB — HEMOGLOBIN A1C
Hgb A1c MFr Bld: 12.6 % — ABNORMAL HIGH (ref 4.8–5.6)
Mean Plasma Glucose: 314.92 mg/dL

## 2022-02-06 LAB — HIV ANTIBODY (ROUTINE TESTING W REFLEX): HIV Screen 4th Generation wRfx: NONREACTIVE

## 2022-02-06 SURGERY — INCISION AND DRAINAGE, ABSCESS
Anesthesia: General | Laterality: Left

## 2022-02-06 MED ORDER — CHLORHEXIDINE GLUCONATE 0.12 % MT SOLN
15.0000 mL | Freq: Once | OROMUCOSAL | Status: AC
  Administered 2022-02-06: 15 mL via OROMUCOSAL

## 2022-02-06 MED ORDER — LACTATED RINGERS IV SOLN
INTRAVENOUS | Status: DC
Start: 2022-02-06 — End: 2022-02-07

## 2022-02-06 MED ORDER — EPHEDRINE SULFATE-NACL 50-0.9 MG/10ML-% IV SOSY
PREFILLED_SYRINGE | INTRAVENOUS | Status: DC | PRN
  Administered 2022-02-06: 5 mg via INTRAVENOUS
  Administered 2022-02-06: 10 mg via INTRAVENOUS

## 2022-02-06 MED ORDER — DEXAMETHASONE SODIUM PHOSPHATE 10 MG/ML IJ SOLN
INTRAMUSCULAR | Status: AC
Start: 2022-02-06 — End: ?
  Filled 2022-02-06: qty 1

## 2022-02-06 MED ORDER — LIDOCAINE 2% (20 MG/ML) 5 ML SYRINGE
INTRAMUSCULAR | Status: DC | PRN
  Administered 2022-02-06: 80 mg via INTRAVENOUS

## 2022-02-06 MED ORDER — PROPOFOL 10 MG/ML IV BOLUS
INTRAVENOUS | Status: DC | PRN
  Administered 2022-02-06: 150 mg via INTRAVENOUS

## 2022-02-06 MED ORDER — ONDANSETRON HCL 4 MG/2ML IJ SOLN
INTRAMUSCULAR | Status: AC
  Filled 2022-02-06: qty 2

## 2022-02-06 MED ORDER — FENTANYL CITRATE PF 50 MCG/ML IJ SOSY: 25.0000 ug | PREFILLED_SYRINGE | INTRAMUSCULAR | Status: DC | PRN

## 2022-02-06 MED ORDER — INSULIN ASPART 100 UNIT/ML IJ SOLN
8.0000 [IU] | Freq: Once | INTRAMUSCULAR | Status: AC
Start: 2022-02-06 — End: 2022-02-06
  Administered 2022-02-06: 8 [IU] via SUBCUTANEOUS

## 2022-02-06 MED ORDER — LACTATED RINGERS IV SOLN: INTRAVENOUS | Status: DC

## 2022-02-06 MED ORDER — CHLORHEXIDINE GLUCONATE CLOTH 2 % EX PADS
6.0000 | MEDICATED_PAD | Freq: Every day | CUTANEOUS | Status: DC
  Administered 2022-02-06 – 2022-02-14 (×9): 6 via TOPICAL

## 2022-02-06 MED ORDER — PROPOFOL 10 MG/ML IV BOLUS
INTRAVENOUS | Status: AC
  Filled 2022-02-06: qty 20

## 2022-02-06 MED ORDER — MIDAZOLAM HCL 2 MG/2ML IJ SOLN
INTRAMUSCULAR | Status: AC
  Filled 2022-02-06: qty 2

## 2022-02-06 MED ORDER — ACETAMINOPHEN 10 MG/ML IV SOLN: 1000.0000 mg | Freq: Once | INTRAVENOUS | Status: DC | PRN

## 2022-02-06 MED ORDER — ORAL CARE MOUTH RINSE: 15.0000 mL | Freq: Once | OROMUCOSAL | Status: AC

## 2022-02-06 MED ORDER — OXYCODONE HCL 5 MG/5ML PO SOLN: 5.0000 mg | Freq: Once | ORAL | Status: DC | PRN

## 2022-02-06 MED ORDER — FENTANYL CITRATE (PF) 100 MCG/2ML IJ SOLN
INTRAMUSCULAR | Status: DC | PRN
Start: 2022-02-06 — End: 2022-02-06
  Administered 2022-02-06: 100 ug via INTRAVENOUS

## 2022-02-06 MED ORDER — SUGAMMADEX SODIUM 200 MG/2ML IV SOLN
INTRAVENOUS | Status: DC | PRN
  Administered 2022-02-06: 200 mg via INTRAVENOUS

## 2022-02-06 MED ORDER — 0.9 % SODIUM CHLORIDE (POUR BTL) OPTIME
TOPICAL | Status: DC | PRN
  Administered 2022-02-06: 1000 mL

## 2022-02-06 MED ORDER — FENTANYL CITRATE (PF) 100 MCG/2ML IJ SOLN
INTRAMUSCULAR | Status: AC
  Filled 2022-02-06: qty 2

## 2022-02-06 MED ORDER — OXYCODONE HCL 5 MG PO TABS: 5.0000 mg | ORAL_TABLET | Freq: Once | ORAL | Status: DC | PRN

## 2022-02-06 MED ORDER — ONDANSETRON HCL 4 MG/2ML IJ SOLN
INTRAMUSCULAR | Status: DC | PRN
  Administered 2022-02-06: 4 mg via INTRAVENOUS

## 2022-02-06 MED ORDER — INSULIN ASPART 100 UNIT/ML IJ SOLN
INTRAMUSCULAR | Status: AC
  Filled 2022-02-06: qty 1

## 2022-02-06 MED ORDER — EPHEDRINE 5 MG/ML INJ
INTRAVENOUS | Status: AC
Start: 2022-02-06 — End: ?
  Filled 2022-02-06: qty 5

## 2022-02-06 MED ORDER — OXYCODONE HCL 5 MG PO TABS
5.0000 mg | ORAL_TABLET | ORAL | Status: DC | PRN
  Administered 2022-02-06 – 2022-02-07 (×3): 5 mg via ORAL
  Filled 2022-02-06 (×3): qty 1

## 2022-02-06 MED ORDER — AMISULPRIDE (ANTIEMETIC) 5 MG/2ML IV SOLN: 10.0000 mg | Freq: Once | INTRAVENOUS | Status: DC | PRN

## 2022-02-06 MED ORDER — DEXAMETHASONE SODIUM PHOSPHATE 10 MG/ML IJ SOLN
INTRAMUSCULAR | Status: DC | PRN
  Administered 2022-02-06: 4 mg via INTRAVENOUS

## 2022-02-06 MED ORDER — ORAL CARE MOUTH RINSE: 15.0000 mL | OROMUCOSAL | Status: DC | PRN

## 2022-02-06 MED ORDER — ROCURONIUM BROMIDE 10 MG/ML (PF) SYRINGE
PREFILLED_SYRINGE | INTRAVENOUS | Status: DC | PRN
  Administered 2022-02-06: 30 mg via INTRAVENOUS

## 2022-02-06 SURGICAL SUPPLY — 38 items
BAG COUNTER SPONGE SURGICOUNT (BAG) IMPLANT
BLADE HEX COATED 2.75 (ELECTRODE) ×1 IMPLANT
BLADE SURG 15 STRL LF DISP TIS (BLADE) ×1 IMPLANT
BLADE SURG 15 STRL SS (BLADE) ×2
BNDG GAUZE DERMACEA FLUFF (GAUZE/BANDAGES/DRESSINGS) ×1
BNDG GAUZE DERMACEA FLUFF 4 (GAUZE/BANDAGES/DRESSINGS) IMPLANT
COVER SURGICAL LIGHT HANDLE (MISCELLANEOUS) ×2 IMPLANT
DRSG PAD ABDOMINAL 8X10 ST (GAUZE/BANDAGES/DRESSINGS) ×1 IMPLANT
ELECT REM PT RETURN 15FT ADLT (MISCELLANEOUS) ×2 IMPLANT
GAUZE 4X4 16PLY ~~LOC~~+RFID DBL (SPONGE) ×1 IMPLANT
GAUZE PACKING IODOFORM 1/4X15 (PACKING) IMPLANT
GAUZE SPONGE 4X4 12PLY STRL (GAUZE/BANDAGES/DRESSINGS) ×2 IMPLANT
GLOVE BIO SURGEON STRL SZ7 (GLOVE) ×4 IMPLANT
GLOVE BIOGEL PI IND STRL 7.0 (GLOVE) ×1 IMPLANT
GLOVE BIOGEL PI IND STRL 7.5 (GLOVE) ×2 IMPLANT
GLOVE BIOGEL PI INDICATOR 7.0 (GLOVE) ×1
GLOVE BIOGEL PI INDICATOR 7.5 (GLOVE) ×2
GOWN STRL REUS W/ TWL LRG LVL3 (GOWN DISPOSABLE) ×2 IMPLANT
GOWN STRL REUS W/ TWL XL LVL3 (GOWN DISPOSABLE) ×1 IMPLANT
GOWN STRL REUS W/TWL LRG LVL3 (GOWN DISPOSABLE) ×4
GOWN STRL REUS W/TWL XL LVL3 (GOWN DISPOSABLE) ×2
KIT BASIN OR (CUSTOM PROCEDURE TRAY) ×2 IMPLANT
KIT TURNOVER KIT A (KITS) IMPLANT
NDL HYPO 25X1 1.5 SAFETY (NEEDLE) IMPLANT
NEEDLE HYPO 25X1 1.5 SAFETY (NEEDLE) ×2 IMPLANT
PACK LITHOTOMY IV (CUSTOM PROCEDURE TRAY) ×2 IMPLANT
PANTS MESH DISP 2XL (UNDERPADS AND DIAPERS) ×1 IMPLANT
PENCIL SMOKE EVACUATOR (MISCELLANEOUS) ×1 IMPLANT
SOL SCRUB PVP POV-IOD 4OZ 7.5% (MISCELLANEOUS)
SOLUTION SCRB POV-IOD 4OZ 7.5% (MISCELLANEOUS) ×1 IMPLANT
SPONGE T-LAP 18X18 ~~LOC~~+RFID (SPONGE) ×2 IMPLANT
SURGILUBE 2OZ TUBE FLIPTOP (MISCELLANEOUS) ×1 IMPLANT
SUT CHROMIC 2 0 SH (SUTURE) IMPLANT
SUT CHROMIC 3 0 SH 27 (SUTURE) IMPLANT
SWAB COLLECTION DEVICE MRSA (MISCELLANEOUS) ×1 IMPLANT
SYR CONTROL 10ML LL (SYRINGE) IMPLANT
TOWEL OR 17X26 10 PK STRL BLUE (TOWEL DISPOSABLE) ×2 IMPLANT
UNDERPAD 30X36 HEAVY ABSORB (UNDERPADS AND DIAPERS) ×1 IMPLANT

## 2022-02-06 NOTE — Consult Note (Signed)
Reason for Consult:gluteal abscess Referring Physician: Dr Andris Baumann is an 61 y.o. female.  HPI: 14 yof with mmp including chf, copd, htn, pvd, tia, dm who apparently has had sacral wound for several weeks.  There has been foul discharge.  She has had change in mental status also.  Her glucose not under good control. Today she complains of pain at that site. She had a ct yesterday that showed soft tissue edema and subq air.  Her wbc was normal as was lactate.she was not seen yesterday and I am following up today.   Past Medical History:  Diagnosis Date   Anxiety    Cellulitis    Chronic diastolic (congestive) heart failure (HCC)    Complication of anesthesia 01/2013   "didn't know where I was; who I was; talking out the top of my head" (04/21/2013)   Concussion as a teenager   slight   COPD (chronic obstructive pulmonary disease) (HCC)    wear oxygen at home   Depression    Diabetic neuropathy (HCC)    GERD (gastroesophageal reflux disease)    Hyperlipidemia    Hypertension    Hypothyroidism    takes Synthroid daily   MRSA (methicillin resistant staph aureus) culture positive    hx of 2012   Neuromuscular disorder (HCC)    OSA on CPAP    and oxygen   Peripheral vascular disease (HCC)    Staph infection 2007   Symptomatic bradycardia 03/04/2014   TIA (transient ischemic attack)    Type II diabetes mellitus (HCC)     Past Surgical History:  Procedure Laterality Date   AMPUTATION Left 06/22/2017   Procedure: LEFT BELOW KNEE AMPUTATION;  Surgeon: Nadara Mustard, MD;  Location: [facility] OR;  Service: Orthopedics;  Laterality: Left;   BLADDER SURGERY  1970's   "stretched the mouth of my bladder" (04/21/2013)   DILATION AND CURETTAGE OF UTERUS  1980's   FOOT SURGERY Right 2012   "put pins in one year; amputated toes another OR" (04/21/2013)   GANGLION CYST EXCISION Left 1990's   "wrist" (04/21/2013)   I & D EXTREMITY Left 08/25/2013   Procedure: IRRIGATION AND DEBRIDEMENT  EXTREMITY;  Surgeon: Axel Filler, MD;  Location: [facility] OR;  Service: General;  Laterality: Left;   INCISION AND DRAINAGE ABSCESS N/A 01/24/2013   Procedure: INCISION AND DRAINAGE ABSCESS;  Surgeon: Clovis Pu. Cornett, MD;  Location: [facility] OR;  Service: General;  Laterality: N/A;   INGUINAL HERNIA REPAIR N/A 01/28/2013   Procedure: I&D Right Groin Wound;  Surgeon: Shelly Rubenstein, MD;  Location: [facility] OR;  Service: General;  Laterality: N/A;   LEG AMPUTATION BELOW KNEE Right 2012   PERMANENT PACEMAKER INSERTION N/A 03/05/2014   Biotronik dual chamber pacemaker implanted by Dr Graciela Husbands for symptomatic bradycardia   REFRACTIVE SURGERY Bilateral 2014   STUMP REVISION Right 12/11/2014   Procedure: Right Below Knee Amputation Revision;  Surgeon: Nadara Mustard, MD;  Location: [facility] OR;  Service: Orthopedics;  Laterality: Right;   TOE AMPUTATION Left ?2011    only 2 toes remaning.    TUBAL LIGATION  1990's   WRIST SURGERY Right 1980's   "pinched nerve" (04/21/2013)    Family History  Problem Relation Age of Onset   Hypertension Mother    Hyperlipidemia Mother    Hypertrophic cardiomyopathy Mother        has pacer   Alzheimer's disease Father    Hypertension Brother    Hyperlipidemia Brother  Cancer Maternal Aunt    Diabetes Paternal Aunt    Diabetes Paternal Uncle    Diabetes Paternal Grandmother    Anesthesia problems Neg Hx    Hypotension Neg Hx    Malignant hyperthermia Neg Hx    Pseudochol deficiency Neg Hx     Social History:  reports that she has been smoking cigarettes. She has a 16.50 pack-year smoking history. She has never used smokeless tobacco. She reports that she does not drink alcohol and does not use drugs.  Allergies:  Allergies  Allergen Reactions   Penicillins Hives    Has patient had a PCN reaction causing immediate rash, facial/tongue/throat swelling, SOB or lightheadedness with hypotension: No Has patient had a PCN reaction causing severe rash involving mucus membranes or  skin necrosis: No Has patient had a PCN reaction that required hospitalization No Has patient had a PCN reaction occurring within the last 10 years: No If all of the above answers are "NO", then may proceed with Cephalosporin use.    Dulaglutide Diarrhea    Trulicity   Erythromycin Diarrhea   Gadolinium Nausea And Vomiting     Code: VOM, Desc: Pt began vomiting immed post infusion of multihance, Onset Date: [id number]    Iodine-131 Nausea And Vomiting   Ivp Dye [Iodinated Contrast Media] Nausea And Vomiting        Metformin Diarrhea    Medications: I have reviewed the patient's current medications. No current facility-administered medications on file prior to encounter.   Current Outpatient Medications on File Prior to Encounter  Medication Sig Dispense Refill   carvedilol (COREG) 3.125 MG tablet Take 1 tablet (3.125 mg total) by mouth 2 (two) times daily with a meal. 30 tablet 0   clopidogrel (PLAVIX) 75 MG tablet Take 1 tablet (75 mg total) by mouth daily. 30 tablet 0   Cyanocobalamin (B-12) 1000 MCG TABS Take 1,000 mcg by mouth daily.     D3 HIGH POTENCY 125 MCG (5000 UT) capsule Take 5,000 Units by mouth daily.     EASY COMFORT LANCETS MISC test blood sugar THREE TIMES DAILY  12   FARXIGA 10 MG TABS tablet Take 10 mg by mouth daily.     fenofibrate 160 MG tablet Take 1 tablet (160 mg total) by mouth daily. 30 tablet 0   FLUoxetine (PROZAC) 10 MG capsule Take 10 mg by mouth daily.     furosemide (LASIX) 40 MG tablet Take 0.5 tablets (20 mg total) daily as needed by mouth. 30 tablet 0   glimepiride (AMARYL) 4 MG tablet Take 4 mg by mouth 2 (two) times daily.     HYDROcodone-acetaminophen (NORCO/VICODIN) 5-325 MG tablet Take 1 tablet by mouth every 4 (four) hours as needed for severe pain. 6 tablet 0   insulin aspart (NOVOLOG) 100 UNIT/ML injection Inject 15 Units 3 (three) times daily with meals into the skin. (Patient taking differently: Inject 0-38 Units into the skin 3 (three)  times daily with meals. Sliding scale) 10 mL 11   levothyroxine (SYNTHROID, LEVOTHROID) 50 MCG tablet Take 50 mcg by mouth daily before breakfast.     lisinopril (ZESTRIL) 2.5 MG tablet Take 2.5 mg by mouth daily.     methocarbamol (ROBAXIN) 500 MG tablet Take 1 tablet (500 mg total) by mouth 2 (two) times daily. 10 tablet 0   metoCLOPramide (REGLAN) 10 MG tablet Take 10 mg by mouth 4 (four) times daily. Take 1 tablet (10 mg) by mouth 30 minutes before meals and at bedtime  11   nystatin cream (MYCOSTATIN) Apply to affected area 2 times daily x 14 days 30 g 1   oxybutynin (DITROPAN XL) 15 MG 24 hr tablet Take 15 mg by mouth at bedtime.     pantoprazole (PROTONIX) 40 MG tablet Take 40 mg by mouth daily.     rosuvastatin (CRESTOR) 40 MG tablet Take 40 mg by mouth daily.      Results for orders placed or performed during the hospital encounter of 02/05/22 (from the past 48 hour(s))  CBC with Differential     Status: Abnormal   Collection Time: 02/05/22 11:49 AM  Result Value Ref Range   WBC 11.9 (H) 4.0 - 10.5 K/uL   RBC 3.55 (L) 3.87 - 5.11 MIL/uL   Hemoglobin 9.6 (L) 12.0 - 15.0 g/dL   HCT 16.6 (L) 06.3 - 01.6 %   MCV 85.4 80.0 - 100.0 fL   MCH 27.0 26.0 - 34.0 pg   MCHC 31.7 30.0 - 36.0 g/dL   RDW 01.0 93.2 - 35.5 %   Platelets 429 (H) 150 - 400 K/uL   nRBC 0.0 0.0 - 0.2 %   Neutrophils Relative % 82 %   Neutro Abs 9.8 (H) 1.7 - 7.7 K/uL   Lymphocytes Relative 11 %   Lymphs Abs 1.3 0.7 - 4.0 K/uL   Monocytes Relative 6 %   Monocytes Absolute 0.7 0.1 - 1.0 K/uL   Eosinophils Relative 0 %   Eosinophils Absolute 0.0 0.0 - 0.5 K/uL   Basophils Relative 0 %   Basophils Absolute 0.0 0.0 - 0.1 K/uL   WBC Morphology MORPHOLOGY UNREMARKABLE    Immature Granulocytes 1 %   Abs Immature Granulocytes 0.09 (H) 0.00 - 0.07 K/uL    Comment: Performed at [facility], [address] [place], [place] [address]  Comprehensive metabolic panel     Status: Abnormal   Collection  Time: 02/05/22 11:49 AM  Result Value Ref Range   Sodium 135 135 - 145 mmol/L    Comment: 0   Potassium 4.8 3.5 - 5.1 mmol/L   Chloride 103 98 - 111 mmol/L   CO2 18 (L) 22 - 32 mmol/L   Glucose, Bld 275 (H) 70 - 99 mg/dL    Comment: Glucose reference range applies only to samples taken after fasting for at least 8 hours.   BUN 107 (H) 6 - 20 mg/dL    Comment: RESULTS CONFIRMED BY MANUAL DILUTION   Creatinine, Ser 2.21 (H) 0.44 - 1.00 mg/dL   Calcium 9.6 8.9 - 25.4 mg/dL   Total Protein 7.3 6.5 - 8.1 g/dL   Albumin 2.6 (L) 3.5 - 5.0 g/dL   AST 28 15 - 41 U/L   ALT 39 0 - 44 U/L   Alkaline Phosphatase 137 (H) 38 - 126 U/L   Total Bilirubin 0.7 0.3 - 1.2 mg/dL   GFR, Estimated 25 (L) >60 mL/min    Comment: (NOTE) Calculated using the CKD-EPI Creatinine Equation (2021)    Anion gap 14 5 - 15    Comment: Performed at [facility], [address], [place], [place] [address]  POC CBG, ED     Status: Abnormal   Collection Time: 02/05/22 12:38 PM  Result Value Ref Range   Glucose-Capillary 266 (H) 70 - 99 mg/dL    Comment: Glucose reference range applies only to samples taken after fasting for at least 8 hours.  Urinalysis, Routine w reflex microscopic Urine, Clean Catch  Status: Abnormal   Collection Time: 02/05/22  1:08 PM  Result Value Ref Range   Color, Urine YELLOW YELLOW   APPearance CLOUDY (A) CLEAR   Specific Gravity, Urine 1.012 1.005 - 1.030   pH 5.0 5.0 - 8.0   Glucose, UA >=500 (A) NEGATIVE mg/dL   Hgb urine dipstick SMALL (A) NEGATIVE   Bilirubin Urine NEGATIVE NEGATIVE   Ketones, ur NEGATIVE NEGATIVE mg/dL   Protein, ur NEGATIVE NEGATIVE mg/dL   Nitrite NEGATIVE NEGATIVE   Leukocytes,Ua LARGE (A) NEGATIVE   RBC / HPF >50 (H) 0 - 5 RBC/hpf   WBC, UA >50 (H) 0 - 5 WBC/hpf   Bacteria, UA MANY (A) NONE SEEN   Squamous Epithelial / LPF 0-5 0 - 5   Budding Yeast PRESENT     Comment: Performed at [facility], [address], [place], [place] [address]  Lactic acid, plasma     Status: None   Collection Time: 02/05/22  1:15 PM  Result Value Ref Range   Lactic Acid, Venous 1.4 0.5 - 1.9 mmol/L    Comment: Performed at [facility], [address], [place], [place] [address]  Glucose, capillary     Status: Abnormal   Collection Time: 02/05/22  6:10 PM  Result Value Ref Range   Glucose-Capillary 257 (H) 70 - 99 mg/dL    Comment: Glucose reference range applies only to samples taken after fasting for at least 8 hours.  Glucose, capillary     Status: Abnormal   Collection Time: 02/05/22  8:14 PM  Result Value Ref Range   Glucose-Capillary 276 (H) 70 - 99 mg/dL    Comment: Glucose reference range applies only to samples taken after fasting for at least 8 hours.  Glucose, capillary     Status: Abnormal   Collection Time: 02/05/22 11:12 PM  Result Value Ref Range   Glucose-Capillary 272 (H) 70 - 99 mg/dL    Comment: Glucose reference range applies only to samples taken after fasting for at least 8 hours.  CBC     Status: Abnormal   Collection Time: 02/06/22  4:27 AM  Result Value Ref Range   WBC 11.3 (H) 4.0 - 10.5 K/uL   RBC 2.99 (L) 3.87 - 5.11 MIL/uL   Hemoglobin 8.2 (L) 12.0 - 15.0 g/dL   HCT 94.7 (L) 65.4 - 65.0 %   MCV 87.0 80.0 - 100.0 fL   MCH 27.4 26.0 - 34.0 pg   MCHC 31.5 30.0 - 36.0 g/dL   RDW 35.4 65.6 - 81.2 %   Platelets 368 150 - 400 K/uL   nRBC 0.0 0.0 - 0.2 %    Comment: Performed at [facility], [address] [place], [place] [address]  Comprehensive metabolic panel     Status: Abnormal   Collection Time: 02/06/22  4:27 AM  Result Value Ref Range   Sodium 135 135 - 145 mmol/L   Potassium 4.8 3.5 - 5.1 mmol/L   Chloride 104 98 - 111 mmol/L   CO2 19 (L) 22 - 32 mmol/L   Glucose, Bld 271 (H) 70 - 99 mg/dL    Comment: Glucose reference range applies only to samples taken after fasting for at least 8 hours.   BUN 87 (H) 6 - 20 mg/dL    Comment:  RESULTS CONFIRMED BY MANUAL DILUTION   Creatinine, Ser 2.19 (H) 0.44 - 1.00 mg/dL   Calcium 9.1 8.9 - 01.7 mg/dL   Total Protein  6.4 (L) 6.5 - 8.1 g/dL   Albumin 2.2 (L) 3.5 - 5.0 g/dL   AST 22 15 - 41 U/L   ALT 31 0 - 44 U/L   Alkaline Phosphatase 131 (H) 38 - 126 U/L   Total Bilirubin 0.9 0.3 - 1.2 mg/dL   GFR, Estimated 25 (L) >60 mL/min    Comment: (NOTE) Calculated using the CKD-EPI Creatinine Equation (2021)    Anion gap 12 5 - 15    Comment: Performed at [facility], [address], [place], [place] [address]  Hemoglobin A1c     Status: Abnormal   Collection Time: 02/06/22  4:27 AM  Result Value Ref Range   Hgb A1c MFr Bld 12.6 (H) 4.8 - 5.6 %    Comment: (NOTE) Pre diabetes:          5.7%-6.4%  Diabetes:              >6.4%  Glycemic control for   <7.0% adults with diabetes    Mean Plasma Glucose 314.92 mg/dL    Comment: Performed at [facility], [address], [place], [place] [address]  MRSA Next Gen by PCR, Nasal     Status: None   Collection Time: 02/06/22  6:40 AM   Specimen: Nasal Mucosa; Nasal Swab  Result Value Ref Range   MRSA by PCR Next Gen NOT DETECTED NOT DETECTED    Comment: (NOTE) The GeneXpert MRSA Assay (FDA approved for NASAL specimens only), is one component of a comprehensive MRSA colonization surveillance program. It is not intended to diagnose MRSA infection nor to guide or monitor treatment for MRSA infections. Test performance is not FDA approved in patients less than 15 years old. Performed at [facility], [address] [place], [place] [address]   Glucose, capillary     Status: Abnormal   Collection Time: 02/06/22  7:33 AM  Result Value Ref Range   Glucose-Capillary 291 (H) 70 - 99 mg/dL    Comment: Glucose reference range applies only to samples taken after fasting for at least 8 hours.   Comment 1 QC Due     CT PELVIS WO CONTRAST  Result Date: 02/05/2022 CLINICAL DATA:  Perianal  abscess or fistula suspected EXAM: CT PELVIS WITHOUT CONTRAST TECHNIQUE: Multidetector CT imaging of the pelvis was performed following the standard protocol without intravenous contrast. RADIATION DOSE REDUCTION: This exam was performed according to the departmental dose-optimization program which includes automated exposure control, adjustment of the mA and/or kV according to patient size and/or use of iterative reconstruction technique. COMPARISON:  CT pelvis 10/16/2018 FINDINGS: Limited evaluation due to lack of IV contrast. Urinary Tract: Mild urinary bladder wall thickening with no focal mass identified. Bowel: No evidence of bowel obstruction. No bowel wall edema visualized. Appendix is normal. Vascular/Lymphatic: Moderate atherosclerotic disease. No bulky lymphadenopathy visualized. Reproductive: Uterus appears grossly unremarkable. No suspicious adnexal mass identified. Mild subcutaneous edema and skin thickening throughout the left labial region extending from the gluteal region. Other: Extensive subcutaneous soft tissue edema and thickening throughout the left gluteal region. The edema measures up to approximately 2.2 cm in depth from the skin surface and is most significant along the inferior half of the left gluteus where there are numerous accompanying foci of subcutaneous air. There is also irregular somewhat tract like edema/density extending medially towards the anus. No definite defined fluid collection identified, although significantly limited evaluation without IV contrast. Musculoskeletal: No acute osseous abnormality identified. IMPRESSION: 1. Extensive  soft tissue edema and inflammation throughout the left gluteal region including several foci of subcutaneous air. Correlate for infection/severe cellulitis. There is also irregular tract like density extending towards the anus which could potentially represent a fistula, although not well evaluated. 2. No definite defined collection/abscess  identified, although significantly limited evaluation without IV contrast. 3. Mild urinary bladder wall thickening noted which is nonspecific, correlate with urinalysis. Electronically Signed   By: Jannifer Hick M.D.   On: 02/05/2022 13:40    Review of Systems  Unable to perform ROS: Acuity of condition   Blood pressure (!) 130/37, pulse 86, temperature 99.3 F (37.4 C), resp. rate 18, height  (1.651 m), weight 85.5 kg, SpO2 94 %. Physical Exam Constitutional:      Appearance: Normal appearance.  Eyes:     General: No scleral icterus. Cardiovascular:     Rate and Rhythm: Normal rate and regular rhythm.  Pulmonary:     Effort: Pulmonary effort is normal.     Breath sounds: Normal breath sounds.  Abdominal:     Palpations: Abdomen is soft.     Tenderness: There is no abdominal tenderness.  Musculoskeletal:     Comments: S/p bilateral le amputations  Lymphadenopathy:     Cervical: No cervical adenopathy.  Skin:    General: Skin is warm and dry.     Capillary Refill: Capillary refill takes less than 2 seconds.  Neurological:     Mental Status: She is alert.   Left buttock with multiple eschars, erythema, tender and fluctuant    Assessment/Plan: Gluteal abscess  - I think this needs incision and drainage in the OR. I discussed this with patient and she appears to understand well. Discussed drainage, open wound, possible additional surgeries. Will proceed today.   I reviewed ED provider notes, hospitalist notes, last 24 h vitals and pain scores, last 24 h labs and trends, and last 24 h imaging results.discussed case with medical team, reviewed ct independently as well as cbc, bmet, lactic acid   This care required high  level of medical decision making.    [name] 02/06/2022, 8:45 AM

## 2022-02-06 NOTE — Op Note (Signed)
Excisional debridement:  1.  Progress note or procedure note with a detailed description of the procedure. Preop dx: NSTI perineum and buttock Postop dx: saa Procedure: incisoin and debridement of perineum and buttock Surgeon: Dr Harden Mo Anes: general EBL 50 cc Complications none Drains none Specimens cx to micro Dispo recovery  Indications; This is a 33 yof with NSTI of her gluteus and perineum. Discussed going to OR asap  Procedure: After informed consent obtained patient taken to the OR.  Abx were already given. She was placed under general anesthesia without complication. She was prepped and draped in lateral position.  Timeout done  I used cautery and kocher clamp to open the skin to eventually 12x6x3 cm in size.  There was purulence I cultured and nonviable tissue. This was debrided to healthy tissue and then was packed with kerlix. Transferred to recovery. Will return Wednesday to OR>  2.  Tool used for debridement (curette, scapel, etc.)  scalpel, kocher  3.  Frequency of surgical debridement.   First time  4.  Measurement of total devitalized tissue (wound surface) before and after surgical debridement.   12x6x3 cm   5.  Area and depth of devitalized tissue removed from wound.  12x6x3 cm  6.  Blood loss and description of tissue removed.  50 cc, necrotic  7.  Evidence of the progress of the wound's response to treatment.  A.  Current wound volume (current dimensions and depth).  12x6x3  B.  Presence (and extent of) of infection.  Purulence cultured  C.  Presence (and extent of) of non viable tissue.  12x6x3 cm debrided  D.  Other material in the wound that is expected to inhibit healing.  none  8.  Was there any viable tissue removed (measurements): no

## 2022-02-06 NOTE — Anesthesia Postprocedure Evaluation (Signed)
Anesthesia Post Note  Patient: [name]  Procedure(s) Performed: INCISION AND DRAINAGE ABSCESS (Left)     Patient location during evaluation: PACU Anesthesia Type: General Level of consciousness: awake Pain management: pain level controlled Vital Signs Assessment: post-procedure vital signs reviewed and stable Respiratory status: spontaneous breathing, nonlabored ventilation, respiratory function stable and patient connected to nasal cannula oxygen Cardiovascular status: blood pressure returned to baseline and stable Postop Assessment: no apparent nausea or vomiting Anesthetic complications: no   No notable events documented.  Last Vitals:  Vitals:   02/06/22 1302 02/06/22 1630  BP: (!) 136/34   Pulse: 66 69  Resp: 19   Temp: 36.7 C   SpO2: 96%     Last Pain:  Vitals:   02/06/22 1302  TempSrc: Oral  PainSc:                  Corda Shutt P Tritia Endo

## 2022-02-06 NOTE — Transfer of Care (Signed)
Immediate Anesthesia Transfer of Care Note  Patient: [name]  Procedure(s) Performed: INCISION AND DRAINAGE ABSCESS (Left)  Patient Location: PACU  Anesthesia Type:General  Level of Consciousness: awake and alert   Airway & Oxygen Therapy: Patient Spontanous Breathing and Patient connected to face mask oxygen  Post-op Assessment: Report given to RN  Post vital signs: Reviewed and stable  Last Vitals:  Vitals Value Taken Time  BP 145/44 02/06/22 1200  Temp    Pulse 71 02/06/22 1159  Resp 32 02/06/22 1202  SpO2 98 % 02/06/22 1159  Vitals shown include unvalidated device data.  Last Pain:  Vitals:   02/06/22 0612  TempSrc:   PainSc: 7       Patients Stated Pain Goal: 3 (02/06/22 1001)  Complications: No notable events documented.

## 2022-02-06 NOTE — Progress Notes (Addendum)
Inpatient Diabetes Program Recommendations  AACE/ADA: New Consensus Statement on Inpatient Glycemic Control (2015)  Target Ranges:  Prepandial:   less than 140 mg/dL      Peak postprandial:   less than 180 mg/dL (1-2 hours)      Critically ill patients:  140 - 180 mg/dL   Lab Results  Component Value Date   GLUCAP 291 (H) 02/06/2022   HGBA1C 12.6 (H) 02/06/2022    Review of Glycemic Control  Latest Reference Range & Units 02/05/22 12:38 02/05/22 18:10 02/05/22 20:14 02/05/22 23:12 02/06/22 07:33  Glucose-Capillary 70 - 99 mg/dL 326 (H) 712 (H) 458 (H) 272 (H) 291 (H)  (H): Data is abnormally high Diabetes history: Type 2 DM Outpatient Diabetes medications: Victoza 1.8 mg QD, Novolog 15 units TID, Toujeo 60 units QHS, Amaryl 4 mg BID, Farxiga 10 mg QD Current orders for Inpatient glycemic control: Novolog 0-15 units TID & HS  Inpatient Diabetes Program Recommendations:    Consider adding Semglee 25 units QD and Novolog 5 units TID (assuming patient is consuming >50% of meals, once diet resumes).   Addendum @ 1300: Attempted to speak with patient regarding outpatient diabetes. Not appropriate for education at this time. Will reattempt on 6/20.  Thanks, Lujean Rave, MSN, RNC-OB Diabetes Coordinator [phone] (8a-5p)

## 2022-02-06 NOTE — Progress Notes (Signed)
PROGRESS NOTE    [name]  OHY:[id number] DOB: Dec 15, 1960 DOA: 02/05/2022 PCP: Loyal Jacobson, MD   Brief Narrative:  61 year old with past medical history significant for anxiety, depression, history of cellulitis, chronic diastolic heart failure, smoker, Concussion, COPD, hyperlipidemia, Hypertension, Hypothyroidism, history of MRSA positive culture, OSA on CPAP, peripheral vascular disease, TIA, diabetes type 2, obesity who presents to the emergency department complaining of worsening sacral ulcer for the past 3 weeks, draining a full smelling purulent discharge.  Per home health nurse patient has had decline in mental status.  Patient report polyuria and polydipsia.    Evaluation in the ED CT abdomen and pelvis show extensive soft tissue edema and inflammation throughout the left gluteal region including several foci of subcutaneous air.  There is also irregular tract like density extending toward the nerves which could potentially represent a fistula although not well evaluated.  UA: 50 white blood cell, white count 11.9, hemoglobin 9.6, glucose 275, creatinine 2.2.  Patient admitted with worsening cellulitis and wound sacral region, AKI on CKD stage IIIb.  General surgery consulted and planning for I&D 6/19  Assessment & Plan:   Principal Problem:   Cellulitis of sacral region Active Problems:   HTN (hypertension)   Hypothyroidism   Type 2 diabetes mellitus with hyperglycemia (HCC)   Depression   Chronic diastolic heart failure (HCC)   OSA on CPAP with oxygen   COPD (chronic obstructive pulmonary disease), on home O2   Stage 3b chronic kidney disease (CKD) (HCC)   AKI (acute kidney injury) (HCC)   GERD (gastroesophageal reflux disease)   Hyperlipidemia   1-Sacral decubitus 1 wound infection and superimposed cellulitis concern for abscess: -Continue with IV antibiotics.  -Surgery consulted, planing I and D today.  IV fluids.   2-AKI on CKD stage IIIb: Prior Cr per  record: 1.5 Cr Peak to: 2.1 Continue with IV fluids.   3-Hypertension:  Continue with Carvedilol.  Hold Lisinopril due to AKI.   4-Hypothyroidism: Continue with Synthroid.   Diabetes type 2 with hyperglycemia uncontrolled Continue with SSI.  NPO, needs speech swallow.   Depression:  Resume prozac tomorrow.   Chronic diastolic Heart failure;  Holding diuretics.  Hold Farxiga die to AKI.   OSA on CPAP;   COPD: PRN nebulizer.    GERD:  PPI.   Hyperlipidemia:  Continue with Crestor.    See wound care documentation below./   Pressure Injury 02/05/22 Knee Anterior;Right Unstageable - Full thickness tissue loss in which the base of the injury is covered by slough (yellow, tan, gray, green or brown) and/or eschar (tan, brown or black) in the wound bed. Black escar to right stum (Active)  02/05/22 1915  Location: Knee  Location Orientation: Anterior;Right  Staging: Unstageable - Full thickness tissue loss in which the base of the injury is covered by slough (yellow, tan, gray, green or brown) and/or eschar (tan, brown or black) in the wound bed.  Wound Description (Comments): Black escar to right stump  Present on Admission: Yes  Dressing Type Foam - Lift dressing to assess site every shift 02/06/22 0642     Pressure Injury 02/05/22 Knee Anterior;Left Unstageable - Full thickness tissue loss in which the base of the injury is covered by slough (yellow, tan, gray, green or brown) and/or eschar (tan, brown or black) in the wound bed. Black eschar to left stump (Active)  02/05/22 1915  Location: Knee  Location Orientation: Anterior;Left  Staging: Unstageable - Full thickness tissue loss in which the  base of the injury is covered by slough (yellow, tan, gray, green or brown) and/or eschar (tan, brown or black) in the wound bed.  Wound Description (Comments): Black eschar to left stump  Present on Admission: Yes  Dressing Type Foam - Lift dressing to assess site every shift  02/06/22 0642     Pressure Injury Buttocks Left Unstageable - Full thickness tissue loss in which the base of the injury is covered by slough (yellow, tan, gray, green or brown) and/or eschar (tan, brown or black) in the wound bed. Puralent, odorous drainage, red, painful (Active)     Location: Buttocks  Location Orientation: Left  Staging: Unstageable - Full thickness tissue loss in which the base of the injury is covered by slough (yellow, tan, gray, green or brown) and/or eschar (tan, brown or black) in the wound bed.  Wound Description (Comments): Puralent, odorous drainage, red, painful, and black eschar present  Present on Admission: Yes  Dressing Type Foam - Lift dressing to assess site every shift 02/06/22 0642      Estimated body mass index is 31.37 kg/m as calculated from the following:   Height as of this encounter: 5\' 5"  (1.651 m).   Weight as of this encounter: 85.5 kg.   DVT prophylaxis: Lovenox Code Status: Full code Family Communication: No family at bedside.  Disposition Plan:  Status is: Inpatient Remains inpatient appropriate because: management of decubitus wound infection.     Consultants:  General Surgery   Procedures:  Excisional debridement 6/19  Antimicrobials:    Subjective: She is sleepy, came form OR>  Per nurse she was sleepy prior to OR>  She only get 20 hour of personal care per week, per nurse report.   Objective: Vitals:   02/05/22 2100 02/05/22 2110 02/06/22 0120 02/06/22 0612  BP:  (!) 133/40 (!) 130/50 (!) 130/37  Pulse:  91 95 86  Resp:  16 18 18   Temp:  98.3 F (36.8 C) 99 F (37.2 C) 99.3 F (37.4 C)  TempSrc:      SpO2:  97% 92% 94%  Weight: 85.5 kg     Height: 5\' 5"  (1.651 m)       Intake/Output Summary (Last 24 hours) at 02/06/2022 0704 Last data filed at 02/06/2022 [id number] Gross per 24 hour  Intake 3860.77 ml  Output 450 ml  Net 3410.77 ml   Filed Weights   02/05/22 1508 02/05/22 2100  Weight: 90.4 kg 85.5 kg     Examination:  General exam: Appears calm and comfortable  Respiratory system: Clear to auscultation. Respiratory effort normal. Cardiovascular system: S1 & S2 heard, RRR. No JVD, murmurs, rubs, gallops or clicks. No pedal edema. Gastrointestinal system: Abdomen is nondistended, soft and nontender.  Central nervous system: sleepy  Extremities: BL BKA   Data Reviewed: I have personally reviewed following labs and imaging studies  CBC: Recent Labs  Lab 02/05/22 1149 02/06/22 0427  WBC 11.9* 11.3*  NEUTROABS 9.8*  --   HGB 9.6* 8.2*  HCT 30.3* 26.0*  MCV 85.4 87.0  PLT 429* [id number]   Basic Metabolic Panel: Recent Labs  Lab 02/05/22 1149 02/06/22 0427  NA 135 135  K 4.8 4.8  CL 103 104  CO2 18* 19*  GLUCOSE 275* 271*  BUN 107* 87*  CREATININE 2.21* 2.19*  CALCIUM 9.6 9.1   GFR: Estimated Creatinine Clearance: 29.5 mL/min (A) (by C-G formula based on SCr of 2.19 mg/dL (H)). Liver Function Tests: Recent Labs  Lab 02/05/22 1149 02/06/22  0427  AST 28 22  ALT 39 31  ALKPHOS 137* 131*  BILITOT 0.7 0.9  PROT 7.3 6.4*  ALBUMIN 2.6* 2.2*   No results for input(s): "LIPASE", "AMYLASE" in the last 168 hours. No results for input(s): "AMMONIA" in the last 168 hours. Coagulation Profile: No results for input(s): "INR", "PROTIME" in the last 168 hours. Cardiac Enzymes: No results for input(s): "CKTOTAL", "CKMB", "CKMBINDEX", "TROPONINI" in the last 168 hours. BNP (last 3 results) No results for input(s): "PROBNP" in the last 8760 hours. HbA1C: No results for input(s): "HGBA1C" in the last 72 hours. CBG: Recent Labs  Lab 02/05/22 1238 02/05/22 1810 02/05/22 2014 02/05/22 2312  GLUCAP 266* 257* 276* 272*   Lipid Profile: No results for input(s): "CHOL", "HDL", "LDLCALC", "TRIG", "CHOLHDL", "LDLDIRECT" in the last 72 hours. Thyroid Function Tests: No results for input(s): "TSH", "T4TOTAL", "FREET4", "T3FREE", "THYROIDAB" in the last 72 hours. Anemia Panel: No  results for input(s): "VITAMINB12", "FOLATE", "FERRITIN", "TIBC", "IRON", "RETICCTPCT" in the last 72 hours. Sepsis Labs: Recent Labs  Lab 02/05/22 1315  LATICACIDVEN 1.4    Recent Results (from the past 240 hour(s))  Blood culture (routine x 2)     Status: None   Collection Time: 01/29/22  2:58 PM   Specimen: BLOOD LEFT HAND  Result Value Ref Range Status   Specimen Description BLOOD LEFT HAND  Final   Special Requests   Final    BOTTLES DRAWN AEROBIC ONLY Blood Culture results may not be optimal due to an inadequate volume of blood received in culture bottles   Culture   Final    NO GROWTH 5 DAYS Performed at [facility], [place] [address], [place], [place] [address]    Report Status 02/03/2022 FINAL  Final  Blood culture (routine x 2)     Status: None   Collection Time: 01/29/22  6:15 PM   Specimen: BLOOD  Result Value Ref Range Status   Specimen Description BLOOD RIGHT ANTECUBITAL  Final   Special Requests   Final    BOTTLES DRAWN AEROBIC AND ANAEROBIC Blood Culture adequate volume   Culture   Final    NO GROWTH 5 DAYS Performed at [facility], [place] [address], [place], [place] [address]    Report Status 02/03/2022 FINAL  Final         Radiology Studies: CT PELVIS WO CONTRAST  Result Date: 02/05/2022 CLINICAL DATA:  Perianal abscess or fistula suspected EXAM: CT PELVIS WITHOUT CONTRAST TECHNIQUE: Multidetector CT imaging of the pelvis was performed following the standard protocol without intravenous contrast. RADIATION DOSE REDUCTION: This exam was performed according to the departmental dose-optimization program which includes automated exposure control, adjustment of the mA and/or kV according to patient size and/or use of iterative reconstruction technique. COMPARISON:  CT pelvis 10/16/2018 FINDINGS: Limited evaluation due to lack of IV contrast. Urinary Tract: Mild urinary bladder wall thickening with no focal mass identified. Bowel: No evidence of bowel  obstruction. No bowel wall edema visualized. Appendix is normal. Vascular/Lymphatic: Moderate atherosclerotic disease. No bulky lymphadenopathy visualized. Reproductive: Uterus appears grossly unremarkable. No suspicious adnexal mass identified. Mild subcutaneous edema and skin thickening throughout the left labial region extending from the gluteal region. Other: Extensive subcutaneous soft tissue edema and thickening throughout the left gluteal region. The edema measures up to approximately 2.2 cm in depth from the skin surface and is most significant along the inferior half of the left gluteus where there are numerous accompanying foci of subcutaneous air.  There is also irregular somewhat tract like edema/density extending medially towards the anus. No definite defined fluid collection identified, although significantly limited evaluation without IV contrast. Musculoskeletal: No acute osseous abnormality identified. IMPRESSION: 1. Extensive soft tissue edema and inflammation throughout the left gluteal region including several foci of subcutaneous air. Correlate for infection/severe cellulitis. There is also irregular tract like density extending towards the anus which could potentially represent a fistula, although not well evaluated. 2. No definite defined collection/abscess identified, although significantly limited evaluation without IV contrast. 3. Mild urinary bladder wall thickening noted which is nonspecific, correlate with urinalysis. Electronically Signed   By: Jannifer Hick M.D.   On: 02/05/2022 13:40        Scheduled Meds:  carvedilol  3.125 mg Oral BID WC   dapagliflozin propanediol  10 mg Oral Daily   enoxaparin (LOVENOX) injection  30 mg Subcutaneous Q24H   glimepiride  4 mg Oral BID WC   insulin aspart  0-15 Units Subcutaneous TID WC   levothyroxine  50 mcg Oral Q0600   metoCLOPramide  10 mg Oral QID   pantoprazole  40 mg Oral Daily   rosuvastatin  40 mg Oral Daily   Continuous  Infusions:  lactated ringers 125 mL/hr at 02/06/22 0421   meropenem (MERREM) IV 200 mL/hr at 02/06/22 0421   [START ON 02/07/2022] vancomycin       LOS: 1 day    Time spent: 35 minutes.     Alba Cory, MD [facility]   If 7PM-7AM, please contact night-coverage [email or web address]  02/06/2022, 7:04 AM

## 2022-02-06 NOTE — Anesthesia Preprocedure Evaluation (Addendum)
Anesthesia Evaluation  Patient identified by MRN, date of birth, ID band  Reviewed: Allergy & Precautions, NPO status , Patient's Chart, lab work & pertinent test results  Airway Mallampati: III  TM Distance: >3 FB Neck ROM: Full    Dental  (+) Edentulous Upper, Edentulous Lower   Pulmonary sleep apnea , COPD,  oxygen dependent, Current Smoker,    Pulmonary exam normal        Cardiovascular hypertension, + Peripheral Vascular Disease and +CHF  Normal cardiovascular exam+ pacemaker      Neuro/Psych  Headaches, PSYCHIATRIC DISORDERS Anxiety Depression TIA Neuromuscular disease    GI/Hepatic Neg liver ROS, GERD  Medicated and Controlled,  Endo/Other  diabetes, Insulin Dependent, Oral Hypoglycemic AgentsHypothyroidism   Renal/GU Renal disease     Musculoskeletal  (+) Arthritis ,   Abdominal (+) + obese,   Peds  Hematology  (+) Blood dyscrasia, anemia ,   Anesthesia Other Findings Peri-rectal Abscess  Reproductive/Obstetrics                            Anesthesia Physical Anesthesia Plan  ASA: 4  Anesthesia Plan: General   Post-op Pain Management:    Induction: Intravenous  PONV Risk Score and Plan: 2 and Ondansetron, Dexamethasone, Midazolam and Treatment may vary due to age or medical condition  Airway Management Planned: Oral ETT  Additional Equipment:   Intra-op Plan:   Post-operative Plan: Extubation in OR  Informed Consent: I have reviewed the patients History and Physical, chart, labs and discussed the procedure including the risks, benefits and alternatives for the proposed anesthesia with the patient or authorized representative who has indicated his/her understanding and acceptance.       Plan Discussed with: CRNA  Anesthesia Plan Comments:        Anesthesia Quick Evaluation

## 2022-02-06 NOTE — Progress Notes (Signed)
Pt refused CPAP for the night.   

## 2022-02-06 NOTE — Anesthesia Procedure Notes (Signed)
Procedure Name: Intubation Date/Time: 02/06/2022 11:28 AM  Performed by: Lavina Hamman, CRNAPre-anesthesia Checklist: Patient identified, Emergency Drugs available, Suction available and Patient being monitored Patient Re-evaluated:Patient Re-evaluated prior to induction Oxygen Delivery Method: Circle System Utilized Preoxygenation: Pre-oxygenation with 100% oxygen Induction Type: IV induction Ventilation: Mask ventilation without difficulty Laryngoscope Size: Mac and 3 Grade View: Grade I Tube type: Oral Number of attempts: 2 Airway Equipment and Method: Stylet and Oral airway Placement Confirmation: ETT inserted through vocal cords under direct vision, positive ETCO2 and breath sounds checked- equal and bilateral Secured at: 22 cm Tube secured with: Tape Dental Injury: Teeth and Oropharynx as per pre-operative assessment  Comments: ATOI, dentures removed, oral airway by EMS student, suctioned for thick secretions, first DL was with secretions and dentures,  Second DL by student easy with dentures and secretions removed.

## 2022-02-07 ENCOUNTER — Encounter (HOSPITAL_COMMUNITY): Payer: Self-pay | Admitting: General Surgery

## 2022-02-07 DIAGNOSIS — L03319 Cellulitis of trunk, unspecified: Secondary | ICD-10-CM | POA: Diagnosis not present

## 2022-02-07 LAB — MAGNESIUM: Magnesium: 2.6 mg/dL — ABNORMAL HIGH (ref 1.7–2.4)

## 2022-02-07 LAB — CBC
HCT: 25.2 % — ABNORMAL LOW (ref 36.0–46.0)
Hemoglobin: 7.7 g/dL — ABNORMAL LOW (ref 12.0–15.0)
MCH: 27.5 pg (ref 26.0–34.0)
MCHC: 30.6 g/dL (ref 30.0–36.0)
MCV: 90 fL (ref 80.0–100.0)
Platelets: 330 10*3/uL (ref 150–400)
RBC: 2.8 MIL/uL — ABNORMAL LOW (ref 3.87–5.11)
RDW: 13.9 % (ref 11.5–15.5)
WBC: 9.5 10*3/uL (ref 4.0–10.5)
nRBC: 0 % (ref 0.0–0.2)

## 2022-02-07 LAB — BASIC METABOLIC PANEL
Anion gap: 12 (ref 5–15)
BUN: 98 mg/dL — ABNORMAL HIGH (ref 6–20)
CO2: 19 mmol/L — ABNORMAL LOW (ref 22–32)
Calcium: 9 mg/dL (ref 8.9–10.3)
Chloride: 108 mmol/L (ref 98–111)
Creatinine, Ser: 2.01 mg/dL — ABNORMAL HIGH (ref 0.44–1.00)
GFR, Estimated: 28 mL/min — ABNORMAL LOW (ref >60)
Glucose, Bld: 308 mg/dL — ABNORMAL HIGH (ref 70–99)
Potassium: 5.1 mmol/L (ref 3.5–5.1)
Sodium: 139 mmol/L (ref 135–145)

## 2022-02-07 LAB — GLUCOSE, CAPILLARY
Glucose-Capillary: 245 mg/dL — ABNORMAL HIGH (ref 70–99)
Glucose-Capillary: 248 mg/dL — ABNORMAL HIGH (ref 70–99)
Glucose-Capillary: 254 mg/dL — ABNORMAL HIGH (ref 70–99)
Glucose-Capillary: 257 mg/dL — ABNORMAL HIGH (ref 70–99)

## 2022-02-07 MED ORDER — ACETAMINOPHEN 500 MG PO TABS
1000.0000 mg | ORAL_TABLET | Freq: Four times a day (QID) | ORAL | Status: DC
  Administered 2022-02-07 – 2022-02-15 (×27): 1000 mg via ORAL
  Filled 2022-02-07 (×29): qty 2

## 2022-02-07 MED ORDER — SODIUM ZIRCONIUM CYCLOSILICATE 5 G PO PACK
5.0000 g | PACK | Freq: Once | ORAL | Status: AC
  Administered 2022-02-07: 5 g via ORAL
  Filled 2022-02-07: qty 1

## 2022-02-07 MED ORDER — INSULIN GLARGINE-YFGN 100 UNIT/ML ~~LOC~~ SOLN
10.0000 [IU] | Freq: Every day | SUBCUTANEOUS | Status: DC
  Administered 2022-02-07 – 2022-02-14 (×8): 10 [IU] via SUBCUTANEOUS
  Filled 2022-02-07 (×10): qty 0.1

## 2022-02-07 MED ORDER — SACCHAROMYCES BOULARDII 250 MG PO CAPS
250.0000 mg | ORAL_CAPSULE | Freq: Two times a day (BID) | ORAL | Status: DC
  Administered 2022-02-07 – 2022-02-15 (×16): 250 mg via ORAL
  Filled 2022-02-07 (×16): qty 1

## 2022-02-07 MED ORDER — CLOPIDOGREL BISULFATE 75 MG PO TABS
75.0000 mg | ORAL_TABLET | Freq: Every morning | ORAL | Status: DC
  Administered 2022-02-08: 75 mg via ORAL
  Filled 2022-02-07: qty 1

## 2022-02-07 MED ORDER — INSULIN GLARGINE-YFGN 100 UNIT/ML ~~LOC~~ SOLN
15.0000 [IU] | Freq: Every day | SUBCUTANEOUS | Status: DC
  Administered 2022-02-07 – 2022-02-15 (×9): 15 [IU] via SUBCUTANEOUS
  Filled 2022-02-07 (×9): qty 0.15

## 2022-02-07 MED ORDER — OXYCODONE HCL 5 MG PO TABS
5.0000 mg | ORAL_TABLET | ORAL | Status: DC | PRN
  Administered 2022-02-07 – 2022-02-14 (×19): 10 mg via ORAL
  Filled 2022-02-07 (×19): qty 2

## 2022-02-07 MED ORDER — METOCLOPRAMIDE HCL 5 MG PO TABS
5.0000 mg | ORAL_TABLET | Freq: Three times a day (TID) | ORAL | Status: DC | PRN
Start: 2022-02-07 — End: 2022-02-15

## 2022-02-07 MED ORDER — FLUOXETINE HCL 10 MG PO CAPS
10.0000 mg | ORAL_CAPSULE | Freq: Every morning | ORAL | Status: DC
  Administered 2022-02-08 – 2022-02-15 (×8): 10 mg via ORAL
  Filled 2022-02-07 (×8): qty 1

## 2022-02-07 MED ORDER — HYDROMORPHONE HCL 1 MG/ML IJ SOLN
0.5000 mg | INTRAMUSCULAR | Status: DC | PRN
  Administered 2022-02-07 – 2022-02-15 (×17): 0.5 mg via INTRAVENOUS
  Filled 2022-02-07 (×17): qty 0.5

## 2022-02-07 MED ORDER — MORPHINE SULFATE (PF) 2 MG/ML IV SOLN: 1.0000 mg | INTRAVENOUS | Status: DC | PRN

## 2022-02-07 MED ORDER — INSULIN GLARGINE-YFGN 100 UNIT/ML ~~LOC~~ SOLN
10.0000 [IU] | Freq: Every day | SUBCUTANEOUS | Status: DC
  Filled 2022-02-07: qty 0.1

## 2022-02-07 MED ORDER — METOCLOPRAMIDE HCL 5 MG PO TABS
5.0000 mg | ORAL_TABLET | Freq: Three times a day (TID) | ORAL | Status: DC
  Administered 2022-02-07: 5 mg via ORAL

## 2022-02-07 MED ORDER — SODIUM CHLORIDE 0.9 % IV SOLN: INTRAVENOUS | Status: DC

## 2022-02-07 NOTE — Progress Notes (Signed)
1 Day Post-Op  Subjective: Doesn't feel much better today.  Having several BMs that are contaminating dressing.  Pain about the same.  ROS: See above, otherwise other systems negative  Objective: Vital signs in last 24 hours: Temp:  [98.1 F (36.7 C)-100 F (37.8 C)] 98.7 F (37.1 C) (06/20 0300) Pulse Rate:  [66-77] 77 (06/20 0912) Resp:  [18-34] 18 (06/20 0400) BP: (120-145)/(34-81) 135/81 (06/20 0912) SpO2:  [92 %-98 %] 95 % (06/20 0300) Last BM Date : 02/05/22  Intake/Output from previous day: 06/19 0701 - 06/20 0700 In: 836.9 [I.V.:828.3; IV Piggyback:8.6] Out: 1950 [Urine:1950] Intake/Output this shift: No intake/output data recorded.  PE: Buttock: dressing in place and outer dressing soiled  Lab Results:  Recent Labs    02/06/22 0427 02/07/22 0705  WBC 11.3* 9.5  HGB 8.2* 7.7*  HCT 26.0* 25.2*  PLT 368 330   BMET Recent Labs    02/06/22 0427 02/07/22 0705  NA 135 139  K 4.8 5.1  CL 104 108  CO2 19* 19*  GLUCOSE 271* 308*  BUN 87* 98*  CREATININE 2.19* 2.01*  CALCIUM 9.1 9.0   PT/INR No results for input(s): "LABPROT", "INR" in the last 72 hours. CMP     Component Value Date/Time   NA 139 02/07/2022 0705   K 5.1 02/07/2022 0705   CL 108 02/07/2022 0705   CO2 19 (L) 02/07/2022 0705   GLUCOSE 308 (H) 02/07/2022 0705   BUN 98 (H) 02/07/2022 0705   CREATININE 2.01 (H) 02/07/2022 0705   CALCIUM 9.0 02/07/2022 0705   PROT 6.4 (L) 02/06/2022 0427   ALBUMIN 2.2 (L) 02/06/2022 0427   AST 22 02/06/2022 0427   ALT 31 02/06/2022 0427   ALKPHOS 131 (H) 02/06/2022 0427   BILITOT 0.9 02/06/2022 0427   GFRNONAA 28 (L) 02/07/2022 0705   GFRAA 43 (L) 02/27/2020 1216   Lipase     Component Value Date/Time   LIPASE 38 12/05/2012 0317       Studies/Results: CT PELVIS WO CONTRAST  Result Date: 02/05/2022 CLINICAL DATA:  Perianal abscess or fistula suspected EXAM: CT PELVIS WITHOUT CONTRAST TECHNIQUE: Multidetector CT imaging of the pelvis  was performed following the standard protocol without intravenous contrast. RADIATION DOSE REDUCTION: This exam was performed according to the departmental dose-optimization program which includes automated exposure control, adjustment of the mA and/or kV according to patient size and/or use of iterative reconstruction technique. COMPARISON:  CT pelvis 10/16/2018 FINDINGS: Limited evaluation due to lack of IV contrast. Urinary Tract: Mild urinary bladder wall thickening with no focal mass identified. Bowel: No evidence of bowel obstruction. No bowel wall edema visualized. Appendix is normal. Vascular/Lymphatic: Moderate atherosclerotic disease. No bulky lymphadenopathy visualized. Reproductive: Uterus appears grossly unremarkable. No suspicious adnexal mass identified. Mild subcutaneous edema and skin thickening throughout the left labial region extending from the gluteal region. Other: Extensive subcutaneous soft tissue edema and thickening throughout the left gluteal region. The edema measures up to approximately 2.2 cm in depth from the skin surface and is most significant along the inferior half of the left gluteus where there are numerous accompanying foci of subcutaneous air. There is also irregular somewhat tract like edema/density extending medially towards the anus. No definite defined fluid collection identified, although significantly limited evaluation without IV contrast. Musculoskeletal: No acute osseous abnormality identified. IMPRESSION: 1. Extensive soft tissue edema and inflammation throughout the left gluteal region including several foci of subcutaneous air. Correlate for infection/severe cellulitis. There is also irregular tract  like density extending towards the anus which could potentially represent a fistula, although not well evaluated. 2. No definite defined collection/abscess identified, although significantly limited evaluation without IV contrast. 3. Mild urinary bladder wall thickening  noted which is nonspecific, correlate with urinalysis. Electronically Signed   By: Jannifer Hick M.D.   On: 02/05/2022 13:40    Anti-infectives: Anti-infectives (From admission, onward)    Start     Dose/Rate Route Frequency Ordered Stop   02/07/22 1500  vancomycin (VANCOCIN) IVPB 1000 mg/200 mL premix        1,000 mg 200 mL/hr over 60 Minutes Intravenous Every 48 hours 02/05/22 1514     02/06/22 0400  meropenem (MERREM) 1 g in sodium chloride 0.9 % 100 mL IVPB        1 g 200 mL/hr over 30 Minutes Intravenous Every 12 hours 02/05/22 1514     02/05/22 1415  vancomycin (VANCOCIN) IVPB 1000 mg/200 mL premix        1,000 mg 200 mL/hr over 60 Minutes Intravenous  Once 02/05/22 1413 02/05/22 2033   02/05/22 1415  meropenem (MERREM) 1 g in sodium chloride 0.9 % 100 mL IVPB        1 g 200 mL/hr over 30 Minutes Intravenous  Once 02/05/22 1413 02/05/22 2032        Assessment/Plan POD 1, s/p excisional debridement of NSTI of perineum and buttock, Dr. Dwain Sarna 6/19 -leave dressing in place today -will plan to return to OR tomorrow for another look and further debridement if necessary -discussed with patient and she is agreeable -cont abx therapy -cultures show gram + rods, await further read  FEN - carb mod, NPO p MN VTE - Lovenox ID - merrem/vanc  I reviewed hospitalist notes, last 24 h vitals and pain scores, last 48 h intake and output, last 24 h labs and trends, and last 24 h imaging results.  PVD with B BKAs Diastolic HF COPD DM HTN HLD GERD TIA   LOS: 2 days    Letha Cape , [facility] 02/07/2022, 10:47 AM Please see Amion for pager number during day hours 7:00am-4:30pm or 7:00am -11:30am on weekends

## 2022-02-07 NOTE — Progress Notes (Addendum)
PROGRESS NOTE    [name]  GUR:[id number] DOB: June 05, 1961 DOA: 02/05/2022 PCP: Loyal Jacobson, MD   Brief Narrative:  61 year old with past medical history significant for anxiety, depression, history of cellulitis, chronic diastolic heart failure, smoker, Concussion, COPD, hyperlipidemia, Hypertension, Hypothyroidism, history of MRSA positive culture, OSA on CPAP, peripheral vascular disease, TIA, diabetes type 2, obesity who presents to the emergency department complaining of worsening sacral ulcer for the past 3 weeks, draining a full smelling purulent discharge.  Per home health nurse patient has had decline in mental status.  Patient report polyuria and polydipsia.    Evaluation in the ED CT abdomen and pelvis show extensive soft tissue edema and inflammation throughout the left gluteal region including several foci of subcutaneous air.  There is also irregular tract like density extending toward the nerves which could potentially represent a fistula although not well evaluated.  UA: 50 white blood cell, white count 11.9, hemoglobin 9.6, glucose 275, creatinine 2.2.  Patient admitted with worsening cellulitis and wound sacral region, AKI on CKD stage IIIb.  General surgery consulted and planning for I&D 6/19  Assessment & Plan:   Principal Problem:   Cellulitis of sacral region Active Problems:   HTN (hypertension)   Hypothyroidism   Type 2 diabetes mellitus with hyperglycemia (HCC)   Depression   Chronic diastolic heart failure (HCC)   OSA on CPAP with oxygen   COPD (chronic obstructive pulmonary disease), on home O2   Stage 3b chronic kidney disease (CKD) (HCC)   AKI (acute kidney injury) (HCC)   GERD (gastroesophageal reflux disease)   Hyperlipidemia   1-Sacral decubitus  wound infection , Necrotizing soft tissue infection:  -Continue with IV antibiotics. Vancomycin and Meropenem.  -Surgery consulted,patient underwent excisional debridement of NSTI on  6/19. -Continue with IV fluids. Local care per Surgery recommendations.  -Wound culture: Abundant gram-positive rods.   2-AKI on CKD stage IIIb: Prior Cr per record: 1.5 Cr Peak to: 2.1--2.0 Continue with IV fluids.  Strict I and O.  Mild Hyperkalemia; one dose kayexalate.   3-Hypertension:  Continue with Carvedilol.  Hold Lisinopril due to AKI.   4-Hypothyroidism: Continue with Synthroid.   Anemia;  Hb trending down -9.6----8.2----7.7. Monitor.  Check anemia panel.   Diabetes type 2 with hyperglycemia uncontrolled Continue with SSI.  Semglee 15 unit in am and 10 Units HS.  Adjust insulin regimen as needed.   Depression:  Resume prozac   Chronic diastolic Heart failure;  Holding diuretics.  Hold Farxiga die to AKI.   OSA on CPAP;  PVD; Suspect she is on plavix for PVD. Resume   COPD: PRN nebulizer.    GERD:  PPI.   Hyperlipidemia:  Continue with Crestor.   Diarrhea; start florastore. Change Regaln to PRN.   Gastroparesis: on Reglan. Will change reglan to PRN due to Diarrhea.   See wound care documentation below./   Pressure Injury 02/05/22 Knee Anterior;Right Unstageable - Full thickness tissue loss in which the base of the injury is covered by slough (yellow, tan, gray, green or brown) and/or eschar (tan, brown or black) in the wound bed. Black escar to right stum (Active)  02/05/22 1915  Location: Knee  Location Orientation: Anterior;Right  Staging: Unstageable - Full thickness tissue loss in which the base of the injury is covered by slough (yellow, tan, gray, green or brown) and/or eschar (tan, brown or black) in the wound bed.  Wound Description (Comments): Black escar to right stump  Present on Admission: Yes  Dressing Type Foam - Lift dressing to assess site every shift 02/06/22 0642     Pressure Injury 02/05/22 Knee Anterior;Left Unstageable - Full thickness tissue loss in which the base of the injury is covered by slough (yellow, tan, gray, green  or brown) and/or eschar (tan, brown or black) in the wound bed. Black eschar to left stump (Active)  02/05/22 1915  Location: Knee  Location Orientation: Anterior;Left  Staging: Unstageable - Full thickness tissue loss in which the base of the injury is covered by slough (yellow, tan, gray, green or brown) and/or eschar (tan, brown or black) in the wound bed.  Wound Description (Comments): Black eschar to left stump  Present on Admission: Yes  Dressing Type Foam - Lift dressing to assess site every shift 02/06/22 0642     Pressure Injury Buttocks Left Unstageable - Full thickness tissue loss in which the base of the injury is covered by slough (yellow, tan, gray, green or brown) and/or eschar (tan, brown or black) in the wound bed. Puralent, odorous drainage, red, painful (Active)     Location: Buttocks  Location Orientation: Left  Staging: Unstageable - Full thickness tissue loss in which the base of the injury is covered by slough (yellow, tan, gray, green or brown) and/or eschar (tan, brown or black) in the wound bed.  Wound Description (Comments): Puralent, odorous drainage, red, painful, and black eschar present  Present on Admission: Yes  Dressing Type Foam - Lift dressing to assess site every shift 02/06/22 0642      Estimated body mass index is 31.37 kg/m as calculated from the following:   Height as of this encounter: 5\' 5"  (1.651 m).   Weight as of this encounter: 85.5 kg.   DVT prophylaxis: Lovenox Code Status: Full code Family Communication: No family at bedside.  Disposition Plan:  Status is: Inpatient Remains inpatient appropriate because: management of decubitus wound infection.     Consultants:  General Surgery   Procedures:  Excisional debridement 6/19  Antimicrobials:    Subjective: She is more alert.  Complaints of pain, buttocks area. She is having diarrhea.   Objective: Vitals:   02/06/22 2137 02/07/22 0300 02/07/22 0400 02/07/22 0912  BP:  (!)  133/49  135/81  Pulse:  71  77  Resp: 20 18 18    Temp:  98.7 F (37.1 C)    TempSrc:  Axillary    SpO2:  95%    Weight:      Height:        Intake/Output Summary (Last 24 hours) at 02/07/2022 1435 Last data filed at 02/07/2022 0305 Gross per 24 hour  Intake 836.89 ml  Output 1950 ml  Net -1113.11 ml    Filed Weights   02/05/22 1508 02/05/22 2100 02/06/22 0950  Weight: 90.4 kg 85.5 kg 85.5 kg    Examination:  General exam: NAD Respiratory system: CTA Cardiovascular system: S 1, S 2 RRR Gastrointestinal system: BS present, soft, nt Central nervous system: Alert, follows command Extremities: BL BKA   Data Reviewed: I have personally reviewed following labs and imaging studies  CBC: Recent Labs  Lab 02/05/22 1149 02/06/22 0427 02/07/22 0705  WBC 11.9* 11.3* 9.5  NEUTROABS 9.8*  --   --   HGB 9.6* 8.2* 7.7*  HCT 30.3* 26.0* 25.2*  MCV 85.4 87.0 90.0  PLT 429* 368 330    Basic Metabolic Panel: Recent Labs  Lab 02/05/22 1149 02/06/22 0427 02/07/22 0705  NA 135 135 139  K 4.8 4.8  5.1  CL 103 104 108  CO2 18* 19* 19*  GLUCOSE 275* 271* 308*  BUN 107* 87* 98*  CREATININE 2.21* 2.19* 2.01*  CALCIUM 9.6 9.1 9.0  MG  --   --  2.6*    GFR: Estimated Creatinine Clearance: 32.1 mL/min (A) (by C-G formula based on SCr of 2.01 mg/dL (H)). Liver Function Tests: Recent Labs  Lab 02/05/22 1149 02/06/22 0427  AST 28 22  ALT 39 31  ALKPHOS 137* 131*  BILITOT 0.7 0.9  PROT 7.3 6.4*  ALBUMIN 2.6* 2.2*    No results for input(s): "LIPASE", "AMYLASE" in the last 168 hours. No results for input(s): "AMMONIA" in the last 168 hours. Coagulation Profile: No results for input(s): "INR", "PROTIME" in the last 168 hours. Cardiac Enzymes: No results for input(s): "CKTOTAL", "CKMB", "CKMBINDEX", "TROPONINI" in the last 168 hours. BNP (last 3 results) No results for input(s): "PROBNP" in the last 8760 hours. HbA1C: Recent Labs    02/06/22 0427  HGBA1C 12.6*    CBG: Recent Labs  Lab 02/06/22 1204 02/06/22 1627 02/06/22 2134 02/07/22 0908 02/07/22 1157  GLUCAP 210* 246* 293* 245* 248*    Lipid Profile: No results for input(s): "CHOL", "HDL", "LDLCALC", "TRIG", "CHOLHDL", "LDLDIRECT" in the last 72 hours. Thyroid Function Tests: No results for input(s): "TSH", "T4TOTAL", "FREET4", "T3FREE", "THYROIDAB" in the last 72 hours. Anemia Panel: No results for input(s): "VITAMINB12", "FOLATE", "FERRITIN", "TIBC", "IRON", "RETICCTPCT" in the last 72 hours. Sepsis Labs: Recent Labs  Lab 02/05/22 1315  LATICACIDVEN 1.4     Recent Results (from the past 240 hour(s))  Blood culture (routine x 2)     Status: None   Collection Time: 01/29/22  2:58 PM   Specimen: BLOOD LEFT HAND  Result Value Ref Range Status   Specimen Description BLOOD LEFT HAND  Final   Special Requests   Final    BOTTLES DRAWN AEROBIC ONLY Blood Culture results may not be optimal due to an inadequate volume of blood received in culture bottles   Culture   Final    NO GROWTH 5 DAYS Performed at [facility], [address], [place], [place] [address]    Report Status 02/03/2022 FINAL  Final  Blood culture (routine x 2)     Status: None   Collection Time: 01/29/22  6:15 PM   Specimen: BLOOD  Result Value Ref Range Status   Specimen Description BLOOD RIGHT ANTECUBITAL  Final   Special Requests   Final    BOTTLES DRAWN AEROBIC AND ANAEROBIC Blood Culture adequate volume   Culture   Final    NO GROWTH 5 DAYS Performed at [facility], [address] [place], [place] [address]    Report Status 02/03/2022 FINAL  Final  MRSA Next Gen by PCR, Nasal     Status: None   Collection Time: 02/06/22  6:40 AM   Specimen: Nasal Mucosa; Nasal Swab  Result Value Ref Range Status   MRSA by PCR Next Gen NOT DETECTED NOT DETECTED Final    Comment: (NOTE) The GeneXpert MRSA Assay (FDA approved for NASAL specimens only), is one component of a comprehensive MRSA  colonization surveillance program. It is not intended to diagnose MRSA infection nor to guide or monitor treatment for MRSA infections. Test performance is not FDA approved in patients less than 61 years old. Performed at [facility], [address], [place], [place] [address]   Aerobic/Anaerobic Culture w Gram Stain (surgical/deep wound)  Status: None (Preliminary result)   Collection Time: 02/06/22 11:33 AM   Specimen: PATH Other; Tissue  Result Value Ref Range Status   Specimen Description   Final    ABSCESS GLUTEAL ABSCESS Performed at [facility] [address], [place], [place] [address]    Special Requests   Final    NONE Performed at [facility], [place] [address] [place], [place] [address]    Gram Stain   Final    FEW WBC PRESENT, PREDOMINANTLY PMN ABUNDANT GRAM POSITIVE RODS    Culture   Final    CULTURE REINCUBATED FOR BETTER GROWTH Performed at [facility], [address] [place],  [address]    Report Status PENDING  Incomplete         Radiology Studies: No results found.      Scheduled Meds:  acetaminophen  1,000 mg Oral Q6H   carvedilol  3.125 mg Oral BID WC   Chlorhexidine Gluconate Cloth  6 each Topical Daily   enoxaparin (LOVENOX) injection  30 mg Subcutaneous Q24H   insulin aspart  0-15 Units Subcutaneous TID WC   insulin glargine-yfgn  10 Units Subcutaneous QHS   insulin glargine-yfgn  15 Units Subcutaneous Daily   levothyroxine  50 mcg Oral Q0600   metoCLOPramide  5 mg Oral TID AC & HS   pantoprazole  40 mg Oral Daily   rosuvastatin  40 mg Oral Daily   saccharomyces boulardii  250 mg Oral BID   sodium zirconium cyclosilicate  5 g Oral Once   Continuous Infusions:  sodium chloride 100 mL/hr at 02/07/22 1231   meropenem (MERREM) IV 1 g (02/07/22 0458)   vancomycin 1,000 mg (02/07/22 1234)     LOS: 2 days    Time spent: 35 minutes.     Elmarie Shiley,  MD [facility]   If 7PM-7AM, please contact night-coverage [email or web address]  02/07/2022, 2:35 PM

## 2022-02-07 NOTE — TOC Initial Note (Signed)
Transition of Care [facility]) - Initial/Assessment Note    Patient Details  Name: [name] MRN: [id number] Date of Birth: 05-21-1961  Transition of [facility]) CM/SW Contact:    Vassie Moselle, LCSW Phone Number: 02/07/2022, 12:21 PM  Clinical Narrative:                 Met with pt and discussed recommendation for SNF placement. Pt states she has been to SNF's in the past and she does not want to go to SNF placement. Pt states she plans to return home at discharge with her [facility] aide that comes 7 days a week twice a day. CSW explored [facility] services for PT/OT to provide additional supports at home. Pt declined having any HHPT/OT arranged and states she would like to return home with her current level of support. CSW encouraged pt to contact CSW if she decides otherwise. No DME needs identified. CSW will continue to follow for safe discharge plan.   Expected Discharge Plan: Home/Self Care Barriers to Discharge: Continued Medical Work up   Patient Goals and CMS Choice Patient states their goals for this hospitalization and ongoing recovery are:: To return home   Choice offered to / list presented to : Patient  Expected Discharge Plan and Services Expected Discharge Plan: Home/Self Care In-house Referral: Clinical Social Work Discharge Planning Services: CM Consult Post Acute Care Choice: NA Living arrangements for the past 2 months: Apartment                 DME Arranged: N/A DME Agency: NA                  Prior Living Arrangements/Services Living arrangements for the past 2 months: Apartment Lives with:: Self Patient language and need for interpreter reviewed:: Yes Do you feel safe going back to the place where you live?: Yes      Need for Family Participation in Patient Care: No (Comment) Care giver support system in place?: Yes (comment) Current home services: DME, Homehealth aide Criminal Activity/Legal Involvement Pertinent to Current Situation/Hospitalization: No -  Comment as needed  Activities of Daily Living Home Assistive Devices/Equipment: Bedside commode/3-in-1, Blood pressure cuff, CBG Meter, Eyeglasses, Prosthesis, Wheelchair, Environmental consultant (specify type) ADL Screening (condition at time of admission) Patient's cognitive ability adequate to safely complete daily activities?: Yes Is the patient deaf or have difficulty hearing?: No Does the patient have difficulty seeing, even when wearing glasses/contacts?: No Does the patient have difficulty concentrating, remembering, or making decisions?: No Patient able to express need for assistance with ADLs?: Yes Does the patient have difficulty dressing or bathing?: No Independently performs ADLs?: No Communication: Independent Dressing (OT): Needs assistance Is this a change from baseline?: Pre-admission baseline Grooming: Needs assistance Is this a change from baseline?: Pre-admission baseline Feeding: Independent Bathing: Needs assistance Is this a change from baseline?: Pre-admission baseline Toileting: Needs assistance Is this a change from baseline?: Pre-admission baseline In/Out Bed: Needs assistance Is this a change from baseline?: Pre-admission baseline Walks in Home: Independent with device (comment) (WC and prosthetics) Does the patient have difficulty walking or climbing stairs?: Yes Weakness of Legs: Both Weakness of Arms/Hands: None  Permission Sought/Granted                  Emotional Assessment Appearance:: Appears older than stated age Attitude/Demeanor/Rapport: Engaged Affect (typically observed): Flat Orientation: : Oriented to Self, Oriented to Place, Oriented to  Time, Oriented to Situation Alcohol / Substance Use: Not Applicable Psych Involvement:  No (comment)  Admission diagnosis:  Cellulitis and abscess of buttock [L02.31, L03.317] Acute cystitis with hematuria [N30.01] Cellulitis of sacral region [L03.319] Patient Active Problem List   Diagnosis Date Noted    Cellulitis of sacral region 02/05/2022   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   Cystitis 10/16/2018   History of left below knee amputation ([place]) 07/16/2017   Abnormal urinalysis 06/18/2017   Diabetic gastroparesis ([place]) 06/18/2017   History of TIA (transient ischemic attack) 06/18/2017   Hyperlipidemia 04/10/2017   Acquired absence of right leg below knee ([place]) 11/29/2016   Left facial numbness    Cerebrovascular disease    Diabetic polyneuropathy associated with type 2 diabetes mellitus (HCC)    Chronic nonintractable headache    GERD (gastroesophageal reflux disease) 07/21/2016   TIA (transient ischemic attack) 07/04/2016   Hyperkalemia 12/07/2015   Cellulitis in diabetic foot ([place]) 12/05/2015   Herpes simplex labialis    Altered mental status    Thrush 10/20/2015   HSV-1 (herpes simplex virus 1) infection    Hallucinations    Acute respiratory failure with hypoxia ([place]) 10/14/2015   Hyperglycemia 10/13/2015   Diabetes mellitus due to underlying condition with hyperosmolarity without nonketotic hyperglycemic-hyperosmolar coma [facility]) ([place]) 10/13/2015   Stage 3b chronic kidney disease (CKD) ([place]) 10/13/2015   AKI (acute kidney injury) ([place]) 08/11/4113   Diastolic CHF ([place]) 64/31/4276   Acute encephalopathy 10/13/2015   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   Lactic acid acidosis    Diabetic foot ulcer ([place]) 12/08/2014   Symptomatic bradycardia 03/04/2014   OSA on CPAP with oxygen 03/04/2014   COPD (chronic obstructive pulmonary disease), on home O2 03/04/2014   Bradycardia 03/04/2014   Cellulitis 08/16/2013   Chronic diastolic heart failure ([place]) 08/16/2013   Sepsis ([place]) 08/16/2013   SOB (shortness of breath) 04/20/2013   Proteinuria 01/26/2013   Acute on chronic kidney failure ([place]) 01/24/2013   Abscess of groin, right 01/23/2013   HTN (hypertension) 01/23/2013   Leukocytosis 01/23/2013   Anemia of chronic disease 01/23/2013    Hypothyroidism 01/23/2013   Hyponatremia 10/06/2011   HYPERCHOLESTEROLEMIA 08/17/2010   Depression 02/01/2010   TOBACCO ABUSE 06/04/2009   Type 2 diabetes mellitus with hyperglycemia ([place]) 08/06/2007   PCP:  Jefm Petty, MD Pharmacy:   [place], [place] - [address] [place] [address] Phone: [phone] Fax: [place], [place] [address] Phone: [phone] Fax: [phone]     Social Determinants of Health (SDOH) Interventions    Readmission Risk Interventions    02/07/2022   12:17 PM  Readmission Risk Prevention Plan  Transportation Screening Complete  PCP or Specialist Appt within 5-7 Days Complete  Home Care Screening Complete  Medication Review (RN CM) Complete

## 2022-02-07 NOTE — Evaluation (Signed)
Clinical/Bedside Swallow Evaluation Patient Details  Name: [name] MRN: [id number] Date of Birth: 1961/03/27  Today's Date: 02/07/2022 Time: SLP Start Time (ACUTE ONLY): 1010 SLP Stop Time (ACUTE ONLY): 1029 SLP Time Calculation (min) (ACUTE ONLY): 19 min  Past Medical History:  Past Medical History:  Diagnosis Date   Anxiety    Cellulitis    Chronic diastolic (congestive) heart failure (HCC)    Complication of anesthesia 01/2013   "didn't know where I was; who I was; talking out the top of my head" (04/21/2013)   Concussion as a teenager   slight   COPD (chronic obstructive pulmonary disease) (HCC)    wear oxygen at home   Depression    Diabetic neuropathy (HCC)    GERD (gastroesophageal reflux disease)    Hyperlipidemia    Hypertension    Hypothyroidism    takes Synthroid daily   MRSA (methicillin resistant staph aureus) culture positive    hx of 2012   Neuromuscular disorder (HCC)    OSA on CPAP    and oxygen   Peripheral vascular disease (HCC)    Staph infection 2007   Symptomatic bradycardia 03/04/2014   TIA (transient ischemic attack)    Type II diabetes mellitus (HCC)    Past Surgical History:  Past Surgical History:  Procedure Laterality Date   AMPUTATION Left 06/22/2017   Procedure: LEFT BELOW KNEE AMPUTATION;  Surgeon: Nadara Mustard, MD;  Location: [facility] OR;  Service: Orthopedics;  Laterality: Left;   BLADDER SURGERY  1970's   "stretched the mouth of my bladder" (04/21/2013)   DILATION AND CURETTAGE OF UTERUS  1980's   FOOT SURGERY Right 2012   "put pins in one year; amputated toes another OR" (04/21/2013)   GANGLION CYST EXCISION Left 1990's   "wrist" (04/21/2013)   I & D EXTREMITY Left 08/25/2013   Procedure: IRRIGATION AND DEBRIDEMENT EXTREMITY;  Surgeon: Axel Filler, MD;  Location: [facility] OR;  Service: General;  Laterality: Left;   INCISION AND DRAINAGE ABSCESS N/A 01/24/2013   Procedure: INCISION AND DRAINAGE ABSCESS;  Surgeon: Clovis Pu. Cornett, MD;   Location: [facility] OR;  Service: General;  Laterality: N/A;   INCISION AND DRAINAGE ABSCESS Left 02/06/2022   Procedure: INCISION AND DRAINAGE ABSCESS;  Surgeon: Emelia Loron, MD;  Location: [facility] OR[facility];  Service: General;  Laterality: Left;   INGUINAL HERNIA REPAIR N/A 01/28/2013   Procedure: I&D Right Groin Wound;  Surgeon: Shelly Rubenstein, MD;  Location: [facility] OR;  Service: General;  Laterality: N/A;   LEG AMPUTATION BELOW KNEE Right 2012   PERMANENT PACEMAKER INSERTION N/A 03/05/2014   Biotronik dual chamber pacemaker implanted by Dr Graciela Husbands for symptomatic bradycardia   REFRACTIVE SURGERY Bilateral 2014   STUMP REVISION Right 12/11/2014   Procedure: Right Below Knee Amputation Revision;  Surgeon: Nadara Mustard, MD;  Location: [facility] OR;  Service: Orthopedics;  Laterality: Right;   TOE AMPUTATION Left ?2011    only 2 toes remaning.    TUBAL LIGATION  1990's   WRIST SURGERY Right 1980's   "pinched nerve" (04/21/2013)   HPI:  Per admit MD notes "61 year old with past medical history significant for anxiety, depression, history of cellulitis, chronic diastolic heart failure, smoker, Concussion, COPD, hyperlipidemia, Hypertension, Hypothyroidism, history of MRSA positive culture, OSA on CPAP, peripheral vascular disease, TIA, diabetes type 2, obesity who presents to the emergency department complaining of worsening sacral ulcer for the past 3 weeks, draining a full smelling purulent discharge.  Per home health nurse patient has  had decline in mental status.  Patient report polyuria and polydipsia."  Pt is s/p debridement surgery on 6/19. Swallow eval ordered.    Assessment / Plan / Recommendation  Clinical Impression  Patient presents with functional oropharyngeal swallow ability based on clinical swallow evaluation. No focal CN deficits, voice and cough are strong.  Pt able to follow directions and has upper denture only.  Facilitated oral care by brushing denture and educating pt to importance of removing  denture nightly and brushing her gums/tongue.  Swallow was timely and no indication of dysphagia. Pt observed consuming 4 ounces applesauce, 4 graham crackers, 4 ounces water, diet soda and single pill provided by RN.  Throat clearing noted x2 during session- which pt attributes to her smoking- and she states she is not interested in stopping.  Recommend regular/thin consistency.  No follow up needed.  Ordered pt's breakfast for her. SLP Visit Diagnosis: Dysphagia, unspecified (R13.10)    Aspiration Risk  Mild aspiration risk    Diet Recommendation Regular;Thin liquid   Liquid Administration via: Straw;Cup Medication Administration: Whole meds with liquid Supervision: Patient able to self feed (set up assist prn) Compensations: Slow rate;Small sips/bites Postural Changes: Remain upright for at least 30 minutes after po intake    Other  Recommendations Oral Care Recommendations: Oral care BID    Recommendations for follow up therapy are one component of a multi-disciplinary discharge planning process, led by the attending physician.  Recommendations may be updated based on patient status, additional functional criteria and insurance authorization.  Follow up Recommendations No SLP follow up      Assistance Recommended at Discharge Set up Supervision/Assistance  Functional Status Assessment Patient has not had a recent decline in their functional status  Frequency and Duration      N/a      Prognosis   N/a     Swallow Study   General Date of Onset: 02/07/22 HPI: Per admit MD notes "61 year old with past medical history significant for anxiety, depression, history of cellulitis, chronic diastolic heart failure, smoker, Concussion, COPD, hyperlipidemia, Hypertension, Hypothyroidism, history of MRSA positive culture, OSA on CPAP, peripheral vascular disease, TIA, diabetes type 2, obesity who presents to the emergency department complaining of worsening sacral ulcer for the past 3 weeks,  draining a full smelling purulent discharge.  Per home health nurse patient has had decline in mental status.  Patient report polyuria and polydipsia."  Pt is s/p debridement surgery on 6/19. Swallow eval ordered. Type of Study: Bedside Swallow Evaluation Diet Prior to this Study: NPO Temperature Spikes Noted: No Respiratory Status: Room air History of Recent Intubation: Yes (for surgery) Behavior/Cognition: Alert;Cooperative;Pleasant mood Oral Cavity Assessment: Within Functional Limits Oral Care Completed by SLP: No Oral Cavity - Dentition: Dentures, top Vision: Functional for self-feeding Self-Feeding Abilities: Able to feed self Patient Positioning: Upright in bed Baseline Vocal Quality: Normal Volitional Cough: Strong Volitional Swallow: Able to elicit    Oral/Motor/Sensory Function Overall Oral Motor/Sensory Function: Within functional limits   Ice Chips Ice chips: Not tested   Thin Liquid Thin Liquid: Within functional limits Presentation: Straw    Nectar Thick Nectar Thick Liquid: Not tested   Honey Thick Honey Thick Liquid: Not tested   Puree Puree: Within functional limits Presentation: Self Fed;Spoon   Solid     Solid: Within functional limits Presentation: Self Fed      Chales Abrahams 02/07/2022,10:38 AM  Rolena Infante, MS [facility] SLP Acute Rehab Services Office [phone] Pager [phone]

## 2022-02-07 NOTE — Evaluation (Signed)
Physical Therapy Evaluation Patient Details Name: [name] MRN: [id number] DOB: 1961/02/28 Today's Date: 02/07/2022  History of Present Illness  61 year old with past medical history significant for B BKA, anxiety, depression, history of cellulitis, chronic diastolic heart failure, smoker, Concussion, COPD, hyperlipidemia, Hypertension, Hypothyroidism, history of MRSA positive culture, OSA on CPAP, peripheral vascular disease, TIA, diabetes type 2, obesity who presents to the emergency department complaining of worsening sacral ulcer for the past 3 weeks, draining a foul smelling purulent discharge.  Per home health nurse patient has had decline in mental status.  Patient report polyuria and polydipsia. Dx of cellulitis. Pt is s/p I&D of perineum and buttock 02/06/22.  Clinical Impression  Pt admitted with above diagnosis. Pt was soiled with BM, assisted pt to roll L and R x 2 for clean up and linen change. Pt was in severe pain with all movement, despite having pain medication earlier this morning. Due to pain and location of wound, she cannot tolerate a sitting position at present. Pt reports that at baseline she transfers to a WC daily and had an aide 2 hours/day x 7 days/week. She will need a higher level of care at a SNF upon acute DC.  Pt currently with functional limitations due to the deficits listed below (see PT Problem List). Pt will benefit from skilled PT to increase their independence and safety with mobility to allow discharge to the venue listed below.          Recommendations for follow up therapy are one component of a multi-disciplinary discharge planning process, led by the attending physician.  Recommendations may be updated based on patient status, additional functional criteria and insurance authorization.  Follow Up Recommendations Skilled nursing-short term rehab (<3 hours/day)    Assistance Recommended at Discharge Frequent or constant Supervision/Assistance  Patient  can return home with the following  Two people to help with walking and/or transfers;Two people to help with bathing/dressing/bathroom;Assistance with cooking/housework;Direct supervision/assist for medications management;Direct supervision/assist for financial management;Help with stairs or ramp for entrance;Assist for transportation    Equipment Recommendations None recommended by PT  Recommendations for Other Services       Functional Status Assessment Patient has had a recent decline in their functional status and demonstrates the ability to make significant improvements in function in a reasonable and predictable amount of time.     Precautions / Restrictions Precautions Precautions: Fall Restrictions Weight Bearing Restrictions: No      Mobility  Bed Mobility Overal bed mobility: Needs Assistance Bed Mobility: Rolling Rolling: +2 for physical assistance, Mod assist         General bed mobility comments: rolled L and R x 2 for pericare and linen change as pt was lying in stool. Pt moaning in pain with all movement.    Transfers                   General transfer comment: deferred 2* pain    Ambulation/Gait                  Stairs            Wheelchair Mobility    Modified Rankin (Stroke Patients Only)       Balance                                             Pertinent Vitals/Pain  Pain Assessment Pain Assessment: Faces Faces Pain Scale: Hurts worst Pain Location: sacral/buttocks wound Pain Descriptors / Indicators: Grimacing, Guarding, Moaning Pain Intervention(s): Limited activity within patient's tolerance, Monitored during session, Premedicated before session    Home Living Family/patient expects to be discharged to:: Private residence Living Arrangements: Alone Available Help at Discharge: Personal care attendant;Available PRN/intermittently Type of Home: Apartment           Home Equipment: Wheelchair  - Forensic psychologist (2 wheels);Shower seat;Grab bars - tub/shower Additional Comments: aide comes 7 days/week for 2 hours    Prior Function               Mobility Comments: pt reports she was transferring to [facility] daily, unclear if she was able to transfer independently as her responses to this question varied       Hand Dominance        Extremity/Trunk Assessment        Lower Extremity Assessment Lower Extremity Assessment: RLE deficits/detail;LLE deficits/detail RLE Deficits / Details: B BKA RLE: Unable to fully assess due to pain LLE Deficits / Details: B BKA LLE: Unable to fully assess due to pain       Communication      Cognition Arousal/Alertness: Awake/alert Behavior During Therapy: Anxious (due to pain) Overall Cognitive Status: Difficult to assess                                 General Comments: pt overwhelmed with pain, difficult to focus on anything else        General Comments      Exercises     Assessment/Plan    PT Assessment Patient needs continued PT services  PT Problem List Decreased mobility;Decreased activity tolerance;Pain       PT Treatment Interventions Therapeutic activities;Therapeutic exercise;Functional mobility training;Patient/family education    PT Goals (Current goals can be found in the Care Plan section)  Acute Rehab PT Goals Patient Stated Goal: decrease pain PT Goal Formulation: With patient Time For Goal Achievement: 02/21/22 Potential to Achieve Goals: Fair    Frequency Min 2X/week     Co-evaluation PT/OT/SLP Co-Evaluation/Treatment: Yes             AM-PAC PT "6 Clicks" Mobility  Outcome Measure Help needed turning from your back to your side while in a flat bed without using bedrails?: Total Help needed moving from lying on your back to sitting on the side of a flat bed without using bedrails?: Total Help needed moving to and from a bed to a chair (including a wheelchair)?:  Total Help needed standing up from a chair using your arms (e.g., wheelchair or bedside chair)?: Total Help needed to walk in hospital room?: Total Help needed climbing 3-5 steps with a railing? : Total 6 Click Score: 6    End of Session   Activity Tolerance: Patient limited by pain Patient left: in bed;with nursing/sitter in room Nurse Communication: Mobility status;Need for lift equipment;Patient requests pain meds PT Visit Diagnosis: Other abnormalities of gait and mobility (R26.89);Pain;Muscle weakness (generalized) (M62.81)    Time: 1025-8527 PT Time Calculation (min) (ACUTE ONLY): 19 min   Charges:   PT Evaluation $PT Eval Moderate Complexity: 1 Mod          Tamala Ser PT 02/07/2022  Acute Rehabilitation Services Pager [phone] Office [phone]

## 2022-02-07 NOTE — Inpatient Diabetes Management (Addendum)
Inpatient Diabetes Program Recommendations  AACE/ADA: New Consensus Statement on Inpatient Glycemic Control (2015)  Target Ranges:  Prepandial:   less than 140 mg/dL      Peak postprandial:   less than 180 mg/dL (1-2 hours)      Critically ill patients:  140 - 180 mg/dL    Latest Reference Range & Units 02/06/22 04:27  Hemoglobin A1C 4.8 - 5.6 % 12.6 (H)  (314 mg/dl)  (H): Data is abnormally high  Latest Reference Range & Units 02/06/22 07:33 02/06/22 10:00 02/06/22 12:04 02/06/22 16:27 02/06/22 21:34  Glucose-Capillary 70 - 99 mg/dL 291 (H)  8 units Novolog _0  271 (H)  8 units Novolog _1   4 mg Decadron _2  210 (H)  5 units Novolog _3  246 (H)  5 units Novolog _4  293 (H)  (H): Data is abnormally high  Latest Reference Range & Units 02/07/22 09:08 02/07/22 11:57  Glucose-Capillary 70 - 99 mg/dL 245 (H)  5 units Novolog  15 units Semglee _5  248 (H)  (H): Data is abnormally high  Latest Reference Range & Units 02/07/22 07:05  Glucose 70 - 99 mg/dL 308 (H)  (H): Data is abnormally high   Admit with: Sacral decubitus 1 wound infection and superimposed cellulitis concern for abscess  History: DM2  Home DM Meds: Victoza 1.8 mg daily     Novolog 15 units TID (NOT taking)     Tresiba U200 60 units QHS (counting 30 clicks)     Amaryl 4 mg BID     Farxiga 10 mg daily  Current Orders: Novolog Moderate Correction Scale/ SSI (0-15 units) TID AC      Semglee 10 units daily    Endocrinologist: Peri Jefferson, PA with [facility] Last seen 12/08/2021 Was instructed to take the following: --Increase Tresiba U-200 to 80 units once a day (40 clicks) --Continue Novolog at 13 units with every meal.  --Start Victoza back at 0.64m once a day for 1 week. After 1 week increase to 1.254monce a day.  If tolerating okay after another week, increase back to 1.73m24mnce a day.  --Continue Farxiga at 53m41mtablet daily. Stay well hydrated.  --Continue glimepiride at 4mg 24mtablet with Breakfast and 1 tablet with Supper.     MD- Note Semglee 10 units daily to start this AM.  Of note, pt taking much higher dose of basal insulin at home (per ENDO notes from last visit in April 2023, pt is supposed to be taking Tresiba U200 insulin 80 units Daily)  Since Lab Glucose was 308 this AM, may consider increasing the Semglee dose to 40 units Daily (50% home dose)    Addendum 11:45am--Met w/ pt at bedside.  Pt very drowsy and appeared very weak, but was able to answer all of my questions.  States she [place] home in the morning that helps her take her AM oral Meds, her AM insulin injection and also prepares her breakfast and help pt bathe.  Pt is then alone the rest of the day.  I asked pt how she is able to safely give her insulin given her limited vision.  Pt stated she knows the color of her insulin pens (Novolog is orange and TresiTyler Aasreen) and will listen for the clicks on the pen when she prepares her dose-BMWU--13ks for the Novolog and 40 clicks for the [place] CBG meter at home and states she tests CBGs 4 times per day.  When I asked if her CBG meter was a "talking" meter, pt stated No, but that the number on the meter is large enough for her to read (I am uncertain about this).  I asked pt how she safely uses the toilet by herself--pt would not give me specific info on how she gets out and back into her wheelchair--she simply stated "I do OK".  When I was in the room with her, a medical supply company called her and said they could not deliver her supplies until she returns home--I asked pt what supplies they were sending and she stated "pull ups".  I question if pt is using her pull ups as her primary means of toileting??  Pt told me her assistant will make her breakfast (usually eggs and toast) and then will leave canned soup for her for lunch and microwave spaghetti for dinner.  I reviewed pt's current A1c of 12.6% with her and she  was surprised to hear it was so high.  I asked her if she sees CBGs in the high 200s to 300s and she said "rarely".  I am unsure pt is able to safely read her CBG meter and question if she is checking her CBG at all at home.  I also question the safety and continuity of her taking her insulin by herself?  Pt told me she had been in a SNF once before.  Discussed with pt that given her current abscess, she will need much better CBG control to help her heal and that perhaps she would do better in a SNF where the staff can help her use the toilet, bathe, and help her with her meds.  Unsure if pt would be agreeable to this?  All of the above info relayed to the RN caring for pt today.    --Will follow patient during hospitalization--  Wyn Quaker RN, MSN, CDE Diabetes Coordinator Inpatient Glycemic Control Team Team Pager: [phone] (8a-5p)

## 2022-02-07 NOTE — Progress Notes (Signed)
Patient refused CPAP qhs.  Pt encouraged to contact RT should she change her mind.

## 2022-02-07 NOTE — Evaluation (Signed)
Occupational Therapy Evaluation Patient Details Name: [name] MRN: [id number] DOB: Dec 31, 1960 Today's Date: 02/07/2022   History of Present Illness 61 year old with past medical history significant for B BKA, anxiety, depression, history of cellulitis, chronic diastolic heart failure, smoker, Concussion, COPD, hyperlipidemia, Hypertension, Hypothyroidism, history of MRSA positive culture, OSA on CPAP, peripheral vascular disease, TIA, diabetes type 2, obesity who presents to the emergency department complaining of worsening sacral ulcer for the past 3 weeks, draining a foul smelling purulent discharge.  Per home health nurse patient has had decline in mental status.  Patient report polyuria and polydipsia. Dx of cellulitis. Pt is s/p I&D of perineum and buttock 02/06/22.   Clinical Impression   Patient is a 61 year old female who was noted to have increased pain with all movement with moaning and calling out. Patient unable to tolerate more than rolling in bed at this time.Patient was noted to have decreased functional activity tolernace, decreased ROM, decreased BUE strength, decreased endurance, decreased sitting balance, decreased standing balanced, decreased safety awareness, and decreased knowledge of AE/AD impacting participation in ADLs. Patient would continue to benefit from skilled OT services at this time while admitted and after d/c to address noted deficits in order to improve overall safety and independence in ADLs.        Recommendations for follow up therapy are one component of a multi-disciplinary discharge planning process, led by the attending physician.  Recommendations may be updated based on patient status, additional functional criteria and insurance authorization.   Follow Up Recommendations  Skilled nursing-short term rehab (<3 hours/day)    Assistance Recommended at Discharge Frequent or constant Supervision/Assistance  Patient can return home with the following  Two people to help with bathing/dressing/bathroom;Two people to help with walking and/or transfers;Direct supervision/assist for medications management;Help with stairs or ramp for entrance;Assist for transportation;Direct supervision/assist for financial management;Assistance with cooking/housework    Functional Status Assessment  Patient has had a recent decline in their functional status and demonstrates the ability to make significant improvements in function in a reasonable and predictable amount of time.  Equipment Recommendations  Other (comment) (defer to next venue)    Recommendations for Other Services       Precautions / Restrictions Precautions Precautions: Fall Precaution Comments: large wound in perineal area. BKA R and L AKA Restrictions Weight Bearing Restrictions: No      Mobility Bed Mobility Overal bed mobility: Needs Assistance Bed Mobility: Rolling Rolling: +2 for physical assistance, Mod assist         General bed mobility comments: rolled L and R x 2 for pericare and linen change as pt was lying in stool. Pt moaning in pain with all movement.    Transfers                          Balance                                           ADL either performed or assessed with clinical judgement   ADL Overall ADL's : Needs assistance/impaired     Grooming: Set up;Bed level   Upper Body Bathing: Maximal assistance;Bed level   Lower Body Bathing: Total assistance;Bed level   Upper Body Dressing : Maximal assistance;Bed level   Lower Body Dressing: Total assistance;Bed level     Toilet Transfer Details (indicate cue  type and reason): unable to attempt. +3 for rolling in bed. Toileting- Clothing Manipulation and Hygiene: Total assistance;Bed level               Vision   Additional Comments: difficult to assess     Perception     Praxis      Pertinent Vitals/Pain Pain Assessment Pain Assessment: Faces Faces  Pain Scale: Hurts worst Pain Location: sacral/buttocks wound Pain Descriptors / Indicators: Grimacing, Guarding, Moaning Pain Intervention(s): Limited activity within patient's tolerance, Monitored during session, Premedicated before session     Hand Dominance Left   Extremity/Trunk Assessment Upper Extremity Assessment Upper Extremity Assessment: RUE deficits/detail;LUE deficits/detail RUE Deficits / Details: patient was able to reach and hold bed rails bilaterally. pain was overwhelming for patient at this time limiting participation in formal MMT.   Lower Extremity Assessment Lower Extremity Assessment: Defer to PT evaluation RLE Deficits / Details: B BKA RLE: Unable to fully assess due to pain LLE Deficits / Details: B BKA LLE: Unable to fully assess due to pain       Communication Communication Communication: No difficulties   Cognition Arousal/Alertness: Awake/alert Behavior During Therapy: Anxious (due to pain) Overall Cognitive Status: Difficult to assess                                 General Comments: pt overwhelmed with pain, difficult to focus on anything else     General Comments       Exercises     Shoulder Instructions      Home Living Family/patient expects to be discharged to:: Private residence Living Arrangements: Alone Available Help at Discharge: Personal care attendant;Available PRN/intermittently Type of Home: Apartment                       Home Equipment: Wheelchair - Forensic psychologist (2 wheels);Shower seat;Grab bars - tub/shower   Additional Comments: aide comes 7 days/week for 2 hours      Prior Functioning/Environment Prior Level of Function : Needs assist             Mobility Comments: pt reports she was transferring to [facility] daily, unclear if she was able to transfer independently as her responses to this question varied ADLs Comments: patient reported having caregiver assist in AM for bathing and  dressing. patient used adult absorbent undergarments during the day. unclear as to how she transfered without another person assist        OT Problem List: Decreased activity tolerance;Impaired balance (sitting and/or standing);Decreased safety awareness;Cardiopulmonary status limiting activity;Decreased knowledge of precautions;Decreased knowledge of use of DME or AE      OT Treatment/Interventions: Self-care/ADL training;Therapeutic exercise;Neuromuscular education;Energy conservation;DME and/or AE instruction;Therapeutic activities;Balance training;Patient/family education    OT Goals(Current goals can be found in the care plan section) Acute Rehab OT Goals Patient Stated Goal: none stated OT Goal Formulation: Patient unable to participate in goal setting Time For Goal Achievement: 02/21/22 Potential to Achieve Goals: Fair  OT Frequency: Min 2X/week    Co-evaluation PT/OT/SLP Co-Evaluation/Treatment: Yes Reason for Co-Treatment: For patient/therapist safety PT goals addressed during session: Mobility/safety with mobility OT goals addressed during session: ADL's and self-care      AM-PAC OT "6 Clicks" Daily Activity     Outcome Measure   Help from another person taking care of personal grooming?: A Little Help from another person toileting, which includes using toliet, bedpan, or urinal?: Total  Help from another person bathing (including washing, rinsing, drying)?: Total Help from another person to put on and taking off regular upper body clothing?: A Lot Help from another person to put on and taking off regular lower body clothing?: Total 6 Click Score: 8   End of Session Nurse Communication: Patient requests pain meds  Activity Tolerance: Patient limited by pain Patient left: in bed;with call bell/phone within reach;with nursing/sitter in room  OT Visit Diagnosis: Unsteadiness on feet (R26.81);Pain                Time: 6834-1962 OT Time Calculation (min): 17  min Charges:  OT General Charges $OT Visit: 1 Visit OT Evaluation $OT Eval Moderate Complexity: 1 Mod  Sharyn Blitz OTR/L, MS Acute Rehabilitation Department Office# [phone] Pager# [phone]   Ardyth Harps 02/07/2022, 1:16 PM

## 2022-02-08 ENCOUNTER — Inpatient Hospital Stay (HOSPITAL_COMMUNITY): Payer: Medicare HMO | Admitting: Anesthesiology

## 2022-02-08 ENCOUNTER — Encounter (HOSPITAL_COMMUNITY): Payer: Self-pay | Admitting: Internal Medicine

## 2022-02-08 ENCOUNTER — Other Ambulatory Visit: Payer: Self-pay

## 2022-02-08 ENCOUNTER — Encounter (HOSPITAL_COMMUNITY)
Admission: EM | Disposition: A | Discharge status: Skilled Nursing Facility | Payer: Self-pay | Source: Home / Self Care | Attending: Internal Medicine

## 2022-02-08 DIAGNOSIS — I5032 Chronic diastolic (congestive) heart failure: Secondary | ICD-10-CM

## 2022-02-08 DIAGNOSIS — I10 Essential (primary) hypertension: Secondary | ICD-10-CM | POA: Diagnosis not present

## 2022-02-08 DIAGNOSIS — Z794 Long term (current) use of insulin: Secondary | ICD-10-CM

## 2022-02-08 DIAGNOSIS — I110 Hypertensive heart disease with heart failure: Secondary | ICD-10-CM

## 2022-02-08 DIAGNOSIS — L02215 Cutaneous abscess of perineum: Secondary | ICD-10-CM

## 2022-02-08 DIAGNOSIS — N179 Acute kidney failure, unspecified: Secondary | ICD-10-CM

## 2022-02-08 DIAGNOSIS — F17210 Nicotine dependence, cigarettes, uncomplicated: Secondary | ICD-10-CM

## 2022-02-08 DIAGNOSIS — F418 Other specified anxiety disorders: Secondary | ICD-10-CM

## 2022-02-08 DIAGNOSIS — E1165 Type 2 diabetes mellitus with hyperglycemia: Secondary | ICD-10-CM

## 2022-02-08 DIAGNOSIS — L03319 Cellulitis of trunk, unspecified: Secondary | ICD-10-CM | POA: Diagnosis not present

## 2022-02-08 LAB — CBC
HCT: 27.7 % — ABNORMAL LOW (ref 36.0–46.0)
Hemoglobin: 8.4 g/dL — ABNORMAL LOW (ref 12.0–15.0)
MCH: 27.1 pg (ref 26.0–34.0)
MCHC: 30.3 g/dL (ref 30.0–36.0)
MCV: 89.4 fL (ref 80.0–100.0)
Platelets: 338 10*3/uL (ref 150–400)
RBC: 3.1 MIL/uL — ABNORMAL LOW (ref 3.87–5.11)
RDW: 14 % (ref 11.5–15.5)
WBC: 7 10*3/uL (ref 4.0–10.5)
nRBC: 0 % (ref 0.0–0.2)

## 2022-02-08 LAB — BASIC METABOLIC PANEL
Anion gap: 9 (ref 5–15)
BUN: 79 mg/dL — ABNORMAL HIGH (ref 6–20)
CO2: 21 mmol/L — ABNORMAL LOW (ref 22–32)
Calcium: 8.6 mg/dL — ABNORMAL LOW (ref 8.9–10.3)
Chloride: 109 mmol/L (ref 98–111)
Creatinine, Ser: 1.63 mg/dL — ABNORMAL HIGH (ref 0.44–1.00)
GFR, Estimated: 36 mL/min — ABNORMAL LOW (ref >60)
Glucose, Bld: 179 mg/dL — ABNORMAL HIGH (ref 70–99)
Potassium: 4.6 mmol/L (ref 3.5–5.1)
Sodium: 139 mmol/L (ref 135–145)

## 2022-02-08 LAB — GLUCOSE, CAPILLARY
Glucose-Capillary: 165 mg/dL — ABNORMAL HIGH (ref 70–99)
Glucose-Capillary: 128 mg/dL — ABNORMAL HIGH (ref 70–99)
Glucose-Capillary: 136 mg/dL — ABNORMAL HIGH (ref 70–99)
Glucose-Capillary: 156 mg/dL — ABNORMAL HIGH (ref 70–99)
Glucose-Capillary: 163 mg/dL — ABNORMAL HIGH (ref 70–99)

## 2022-02-08 LAB — IRON AND TIBC
Iron: 16 ug/dL — ABNORMAL LOW (ref 28–170)
Saturation Ratios: 7 % — ABNORMAL LOW (ref 10.4–31.8)
TIBC: 216 ug/dL — ABNORMAL LOW (ref 250–450)
UIBC: 200 ug/dL

## 2022-02-08 LAB — RETICULOCYTES
Immature Retic Fract: 20.8 % — ABNORMAL HIGH (ref 2.3–15.9)
RBC.: 3.09 MIL/uL — ABNORMAL LOW (ref 3.87–5.11)
Retic Count, Absolute: 44.2 10*3/uL (ref 19.0–186.0)
Retic Ct Pct: 1.4 % (ref 0.4–3.1)

## 2022-02-08 LAB — FERRITIN: Ferritin: 331 ng/mL — ABNORMAL HIGH (ref 11–307)

## 2022-02-08 LAB — FOLATE: Folate: 9.8 ng/mL (ref >5.9)

## 2022-02-08 LAB — VITAMIN B12: Vitamin B-12: 1927 pg/mL — ABNORMAL HIGH (ref 180–914)

## 2022-02-08 SURGERY — INCISION AND DRAINAGE, ABSCESS
Anesthesia: General

## 2022-02-08 MED ORDER — ROCURONIUM BROMIDE 100 MG/10ML IV SOLN
INTRAVENOUS | Status: DC | PRN
  Administered 2022-02-08: 30 mg via INTRAVENOUS

## 2022-02-08 MED ORDER — LACTATED RINGERS IV SOLN: INTRAVENOUS | Status: DC

## 2022-02-08 MED ORDER — AMISULPRIDE (ANTIEMETIC) 5 MG/2ML IV SOLN
10.0000 mg | Freq: Once | INTRAVENOUS | Status: DC | PRN
Start: 2022-02-08 — End: 2022-02-08

## 2022-02-08 MED ORDER — BUPIVACAINE-EPINEPHRINE (PF) 0.25% -1:200000 IJ SOLN
INTRAMUSCULAR | Status: AC
  Filled 2022-02-08: qty 30

## 2022-02-08 MED ORDER — SUCCINYLCHOLINE CHLORIDE 200 MG/10ML IV SOSY
PREFILLED_SYRINGE | INTRAVENOUS | Status: AC
  Filled 2022-02-08: qty 10

## 2022-02-08 MED ORDER — PHENYLEPHRINE HCL (PRESSORS) 10 MG/ML IV SOLN
INTRAVENOUS | Status: DC | PRN
  Administered 2022-02-08: 160 ug via INTRAVENOUS

## 2022-02-08 MED ORDER — PROPOFOL 10 MG/ML IV BOLUS
INTRAVENOUS | Status: AC
  Filled 2022-02-08: qty 20

## 2022-02-08 MED ORDER — LIDOCAINE HCL (PF) 2 % IJ SOLN
INTRAMUSCULAR | Status: AC
  Filled 2022-02-08: qty 5

## 2022-02-08 MED ORDER — ONDANSETRON HCL 4 MG/2ML IJ SOLN
INTRAMUSCULAR | Status: DC | PRN
  Administered 2022-02-08: 4 mg via INTRAVENOUS

## 2022-02-08 MED ORDER — DEXAMETHASONE SODIUM PHOSPHATE 10 MG/ML IJ SOLN
INTRAMUSCULAR | Status: AC
Start: 2022-02-08 — End: ?
  Filled 2022-02-08: qty 1

## 2022-02-08 MED ORDER — SUGAMMADEX SODIUM 500 MG/5ML IV SOLN
INTRAVENOUS | Status: DC | PRN
  Administered 2022-02-08: 200 mg via INTRAVENOUS

## 2022-02-08 MED ORDER — DEXAMETHASONE SODIUM PHOSPHATE 10 MG/ML IJ SOLN
INTRAMUSCULAR | Status: DC | PRN
  Administered 2022-02-08: 4 mg via INTRAVENOUS

## 2022-02-08 MED ORDER — ROCURONIUM BROMIDE 10 MG/ML (PF) SYRINGE
PREFILLED_SYRINGE | INTRAVENOUS | Status: AC
Start: 2022-02-08 — End: ?
  Filled 2022-02-08: qty 10

## 2022-02-08 MED ORDER — FENTANYL CITRATE (PF) 100 MCG/2ML IJ SOLN
INTRAMUSCULAR | Status: AC
  Filled 2022-02-08: qty 2

## 2022-02-08 MED ORDER — FENTANYL CITRATE PF 50 MCG/ML IJ SOSY: 25.0000 ug | PREFILLED_SYRINGE | INTRAMUSCULAR | Status: DC | PRN

## 2022-02-08 MED ORDER — ROCURONIUM BROMIDE 10 MG/ML (PF) SYRINGE
PREFILLED_SYRINGE | INTRAVENOUS | Status: AC
  Filled 2022-02-08: qty 10

## 2022-02-08 MED ORDER — LIDOCAINE HCL (CARDIAC) PF 100 MG/5ML IV SOSY
PREFILLED_SYRINGE | INTRAVENOUS | Status: DC | PRN
  Administered 2022-02-08: 60 mg via INTRAVENOUS

## 2022-02-08 MED ORDER — FENTANYL CITRATE (PF) 100 MCG/2ML IJ SOLN
INTRAMUSCULAR | Status: DC | PRN
  Administered 2022-02-08: 100 ug via INTRAVENOUS

## 2022-02-08 MED ORDER — ONDANSETRON HCL 4 MG/2ML IJ SOLN
INTRAMUSCULAR | Status: AC
  Filled 2022-02-08: qty 2

## 2022-02-08 MED ORDER — DEXAMETHASONE SODIUM PHOSPHATE 10 MG/ML IJ SOLN
INTRAMUSCULAR | Status: AC
  Filled 2022-02-08: qty 1

## 2022-02-08 MED ORDER — METRONIDAZOLE 500 MG PO TABS
500.0000 mg | ORAL_TABLET | Freq: Two times a day (BID) | ORAL | Status: DC
  Administered 2022-02-08 – 2022-02-15 (×14): 500 mg via ORAL
  Filled 2022-02-08 (×14): qty 1

## 2022-02-08 MED ORDER — PHENYLEPHRINE 80 MCG/ML (10ML) SYRINGE FOR IV PUSH (FOR BLOOD PRESSURE SUPPORT)
PREFILLED_SYRINGE | INTRAVENOUS | Status: AC
  Filled 2022-02-08: qty 10

## 2022-02-08 MED ORDER — SODIUM CHLORIDE 0.9 % IV SOLN
2.0000 g | INTRAVENOUS | Status: DC
  Administered 2022-02-08 – 2022-02-11 (×4): 2 g via INTRAVENOUS
  Filled 2022-02-08 (×4): qty 20

## 2022-02-08 MED ORDER — ONDANSETRON HCL 4 MG/2ML IJ SOLN
4.0000 mg | Freq: Once | INTRAMUSCULAR | Status: DC | PRN
Start: 2022-02-08 — End: 2022-02-08

## 2022-02-08 MED ORDER — CHLORHEXIDINE GLUCONATE 0.12 % MT SOLN
15.0000 mL | Freq: Once | OROMUCOSAL | Status: DC
Start: 2022-02-08 — End: 2022-02-08

## 2022-02-08 MED ORDER — 0.9 % SODIUM CHLORIDE (POUR BTL) OPTIME
TOPICAL | Status: DC | PRN
  Administered 2022-02-08: 1000 mL

## 2022-02-08 MED ORDER — PROPOFOL 10 MG/ML IV BOLUS
INTRAVENOUS | Status: DC | PRN
  Administered 2022-02-08: 100 mg via INTRAVENOUS

## 2022-02-08 SURGICAL SUPPLY — 37 items
BAG COUNTER SPONGE SURGICOUNT (BAG) IMPLANT
BLADE HEX COATED 2.75 (ELECTRODE) ×2 IMPLANT
BLADE SURG SZ10 CARB STEEL (BLADE) ×2 IMPLANT
COVER SURGICAL LIGHT HANDLE (MISCELLANEOUS) ×2 IMPLANT
DERMABOND ADVANCED (GAUZE/BANDAGES/DRESSINGS)
DERMABOND ADVANCED .7 DNX12 (GAUZE/BANDAGES/DRESSINGS) IMPLANT
DRAPE LAPAROSCOPIC ABDOMINAL (DRAPES) IMPLANT
DRAPE LAPAROTOMY T 102X78X121 (DRAPES) IMPLANT
DRAPE LAPAROTOMY TRNSV 102X78 (DRAPES) IMPLANT
DRAPE SHEET LG 3/4 BI-LAMINATE (DRAPES) IMPLANT
ELECT REM PT RETURN 15FT ADLT (MISCELLANEOUS) ×2 IMPLANT
GAUZE SPONGE 4X4 12PLY STRL (GAUZE/BANDAGES/DRESSINGS) ×2 IMPLANT
GLOVE BIO SURGEON STRL SZ7 (GLOVE) ×6 IMPLANT
GLOVE BIOGEL PI IND STRL 7.0 (GLOVE) ×1 IMPLANT
GLOVE BIOGEL PI IND STRL 7.5 (GLOVE) ×3 IMPLANT
GLOVE BIOGEL PI INDICATOR 7.0 (GLOVE) ×1
GLOVE BIOGEL PI INDICATOR 7.5 (GLOVE) ×3
GOWN STRL REUS W/ TWL LRG LVL3 (GOWN DISPOSABLE) ×2 IMPLANT
GOWN STRL REUS W/ TWL XL LVL3 (GOWN DISPOSABLE) ×1 IMPLANT
GOWN STRL REUS W/TWL LRG LVL3 (GOWN DISPOSABLE) ×4
GOWN STRL REUS W/TWL XL LVL3 (GOWN DISPOSABLE) ×2
KIT BASIN OR (CUSTOM PROCEDURE TRAY) ×2 IMPLANT
KIT TURNOVER KIT A (KITS) IMPLANT
MARKER SKIN DUAL TIP RULER LAB (MISCELLANEOUS) ×2 IMPLANT
NDL HYPO 25X1 1.5 SAFETY (NEEDLE) ×1 IMPLANT
NEEDLE HYPO 25X1 1.5 SAFETY (NEEDLE) ×2 IMPLANT
NS IRRIG 1000ML POUR BTL (IV SOLUTION) ×2 IMPLANT
PACK BASIC VI WITH GOWN DISP (CUSTOM PROCEDURE TRAY) ×2 IMPLANT
PENCIL SMOKE EVACUATOR (MISCELLANEOUS) IMPLANT
SPIKE FLUID TRANSFER (MISCELLANEOUS) IMPLANT
SPONGE T-LAP 4X18 ~~LOC~~+RFID (SPONGE) IMPLANT
STAPLER VISISTAT 35W (STAPLE) IMPLANT
SUT MNCRL AB 4-0 PS2 18 (SUTURE) IMPLANT
SUT VIC AB 3-0 SH 18 (SUTURE) IMPLANT
SYR CONTROL 10ML LL (SYRINGE) ×2 IMPLANT
TOWEL OR 17X26 10 PK STRL BLUE (TOWEL DISPOSABLE) ×2 IMPLANT
TOWEL OR NON WOVEN STRL DISP B (DISPOSABLE) ×2 IMPLANT

## 2022-02-08 NOTE — Anesthesia Procedure Notes (Signed)
Procedure Name: Intubation Date/Time: 02/08/2022 2:38 PM  Performed by: Jonna Munro, CRNAPre-anesthesia Checklist: Patient identified, Emergency Drugs available, Suction available, Patient being monitored and Timeout performed Patient Re-evaluated:Patient Re-evaluated prior to induction Oxygen Delivery Method: Circle system utilized Preoxygenation: Pre-oxygenation with 100% oxygen Induction Type: IV induction Ventilation: Mask ventilation without difficulty Laryngoscope Size: Mac and 3 Grade View: Grade I Tube type: Oral Tube size: 7.0 mm Number of attempts: 1 Airway Equipment and Method: Stylet Placement Confirmation: ETT inserted through vocal cords under direct vision, positive ETCO2, CO2 detector and breath sounds checked- equal and bilateral Secured at: 22 cm Tube secured with: Tape Dental Injury: Teeth and Oropharynx as per pre-operative assessment

## 2022-02-08 NOTE — Anesthesia Preprocedure Evaluation (Addendum)
Anesthesia Evaluation  Patient identified by MRN, date of birth, ID band Patient confused    Reviewed: Allergy & Precautions, NPO status , Patient's Chart, lab work & pertinent test results  Airway Mallampati: III  TM Distance: >3 FB Neck ROM: Full    Dental  (+) Edentulous Upper, Edentulous Lower   Pulmonary sleep apnea , COPD,  oxygen dependent, Current Smoker and Patient abstained from smoking.,    Pulmonary exam normal        Cardiovascular hypertension, + Peripheral Vascular Disease and +CHF  Normal cardiovascular exam+ pacemaker      Neuro/Psych  Headaches, PSYCHIATRIC DISORDERS Anxiety Depression TIA Neuromuscular disease    GI/Hepatic Neg liver ROS, GERD  Medicated and Controlled,  Endo/Other  diabetes, Insulin Dependent, Oral Hypoglycemic AgentsHypothyroidism   Renal/GU Renal InsufficiencyRenal disease     Musculoskeletal  (+) Arthritis ,   Abdominal (+) + obese,   Peds  Hematology  (+) Blood dyscrasia, anemia ,   Anesthesia Other Findings Perineum abscess  Reproductive/Obstetrics                             Anesthesia Physical  Anesthesia Plan  ASA: 4  Anesthesia Plan: General   Post-op Pain Management:    Induction: Intravenous  PONV Risk Score and Plan: 2 and Ondansetron, Dexamethasone and Treatment may vary due to age or medical condition  Airway Management Planned: Oral ETT  Additional Equipment:   Intra-op Plan:   Post-operative Plan: Extubation in OR  Informed Consent: I have reviewed the patients History and Physical, chart, labs and discussed the procedure including the risks, benefits and alternatives for the proposed anesthesia with the patient or authorized representative who has indicated his/her understanding and acceptance.       Plan Discussed with: CRNA  Anesthesia Plan Comments:        Anesthesia Quick Evaluation

## 2022-02-08 NOTE — Progress Notes (Signed)
Day of Surgery   Subjective/Chief Complaint: confused   Objective: Vital signs in last 24 hours: Temp:  [97.8 F (36.6 C)-99.2 F (37.3 C)] 99.2 F (37.3 C) (06/21 0308) Pulse Rate:  [69-81] 69 (06/21 0900) Resp:  [16] 16 (06/21 0308) BP: (111-165)/(30-63) 111/30 (06/21 0900) SpO2:  [95 %-96 %] 95 % (06/21 0308) Weight:  [85.5 kg] 85.5 kg (06/21 1309) Last BM Date : 02/08/22  Intake/Output from previous day: 06/20 0701 - 06/21 0700 In: 2037.3 [P.O.:240; I.V.:1297.3; IV Piggyback:500] Out: 1200 [Urine:1200] Intake/Output this shift: Total I/O In: -  Out: 850 [Urine:850]  To examine wound in OR  Lab Results:  Recent Labs    02/07/22 0705 02/08/22 0646  WBC 9.5 7.0  HGB 7.7* 8.4*  HCT 25.2* 27.7*  PLT 330 338   BMET Recent Labs    02/07/22 0705 02/08/22 0646  NA 139 139  K 5.1 4.6  CL 108 109  CO2 19* 21*  GLUCOSE 308* 179*  BUN 98* 79*  CREATININE 2.01* 1.63*  CALCIUM 9.0 8.6*   PT/INR No results for input(s): "LABPROT", "INR" in the last 72 hours. ABG No results for input(s): "PHART", "HCO3" in the last 72 hours.  Invalid input(s): "PCO2", "PO2"  Studies/Results: No results found.  Anti-infectives: Anti-infectives (From admission, onward)    Start     Dose/Rate Route Frequency Ordered Stop   02/08/22 1700  [MAR Hold]  cefTRIAXone (ROCEPHIN) 2 g in sodium chloride 0.9 % 100 mL IVPB        (MAR Hold since Wed 02/08/2022 at 1302.Hold Reason: Transfer to a Procedural area)   2 g 200 mL/hr over 30 Minutes Intravenous Every 24 hours 02/08/22 1114     02/08/22 1700  [MAR Hold]  metroNIDAZOLE (FLAGYL) tablet 500 mg        (MAR Hold since Wed 02/08/2022 at 1302.Hold Reason: Transfer to a Procedural area)   500 mg Oral Every 12 hours 02/08/22 1114     02/07/22 1200  [MAR Hold]  vancomycin (VANCOCIN) IVPB 1000 mg/200 mL premix        (MAR Hold since Wed 02/08/2022 at 1302.Hold Reason: Transfer to a Procedural area)   1,000 mg 200 mL/hr over 60 Minutes  Intravenous Every 48 hours 02/05/22 1514     02/06/22 0400  meropenem (MERREM) 1 g in sodium chloride 0.9 % 100 mL IVPB  Status:  Discontinued        1 g 200 mL/hr over 30 Minutes Intravenous Every 12 hours 02/05/22 1514 02/08/22 1114   02/05/22 1415  vancomycin (VANCOCIN) IVPB 1000 mg/200 mL premix        1,000 mg 200 mL/hr over 60 Minutes Intravenous  Once 02/05/22 1413 02/05/22 2033   02/05/22 1415  meropenem (MERREM) 1 g in sodium chloride 0.9 % 100 mL IVPB        1 g 200 mL/hr over 30 Minutes Intravenous  Once 02/05/22 1413 02/05/22 2032       Assessment/Plan:  POD 2, s/p excisional debridement of NSTI of perineum and buttock, Dr. Dwain Sarna 6/19 -will plan to return to OR today for another look and further debridement if necessary -cont abx therapy -multiple organisms on cx   FEN - carb mod, NPO p MN VTE - Lovenox ID - merrem/vanc   I reviewed hospitalist notes, last 24 h vitals and pain scores, last 48 h intake and output, last 24 h labs and trends, and last 24 h imaging results.   PVD with B  BKAs Diastolic HF COPD DM HTN HLD GERD TIA  [name] 02/08/2022

## 2022-02-08 NOTE — Op Note (Signed)
Excisional debridement:   1.  Progress note or procedure note with a detailed description of the procedure. Preop dx: NSTI perineum and buttock Postop dx: saa Procedure: incision and debridement of perineum and buttock Surgeon: Dr Harden Mo Anes: general EBL minimal Complications none Drains none Specimens none Dispo recovery   Indications; This is a 47 yof with NSTI of her gluteus and perineum. I debrided her Monday emergently when I saw her and plan to return her today for reevaluation.  Procedure: After informed consent obtained patient taken to the OR.  Abx were already given. She was placed under general anesthesia without complication. She was prepped and draped in prone position.  Timeout done   I used cautery and kocher clamp to open the skin to eventually 21x10x4cm in size.  there was additional necrotic tissue consistent with NSTI from the prior 12x6x3 cm wound. This was debrided to healthy tissue and then was packed with kerlix. Transferred to recovery. Will likely return Friday to OR.   2.  Tool used for debridement (curette, scapel, etc.)  scalpel, kocher   3.  Frequency of surgical debridement.   second time   4.  Measurement of total devitalized tissue (wound surface) before and after surgical debridement.   12x6x3 cm ---21x10x4 cm   5.  Area and depth of devitalized tissue removed from wound. 9x4x1 cm   6.  Blood loss and description of tissue removed.  minimal, necrotic   7.  Evidence of the progress of the wound's response to treatment.             A.  Current wound volume (current dimensions and depth).  21x10x4             B.  Presence (and extent of) of infection.  additional necrotic tissue             C.  Presence (and extent of) of non viable tissue.  9x4x1 cm debrided             D.  Other material in the wound that is expected to inhibit healing.  none   8.  Was there any viable tissue removed (measurements): no

## 2022-02-08 NOTE — Transfer of Care (Signed)
Immediate Anesthesia Transfer of Care Note  Patient: [name]  Procedure(s) Performed: INCISION AND DRAINAGE ABSCESS PERINEUM  Patient Location: PACU  Anesthesia Type:General  Level of Consciousness: awake, drowsy and patient cooperative  Airway & Oxygen Therapy: Patient Spontanous Breathing and Patient connected to face mask oxygen  Post-op Assessment: Report given to RN, Post -op Vital signs reviewed and stable and Patient moving all extremities X 4  Post vital signs: Reviewed and stable  Last Vitals:  Vitals Value Taken Time  BP 165/62 02/08/22 1531  Temp    Pulse 67 02/08/22 1536  Resp 17 02/08/22 1536  SpO2 100 % 02/08/22 1536  Vitals shown include unvalidated device data.  Last Pain:  Vitals:   02/08/22 1309  TempSrc:   PainSc: 0-No pain      Patients Stated Pain Goal: 2 (02/07/22 2146)  Complications: No notable events documented.

## 2022-02-08 NOTE — Progress Notes (Signed)
PROGRESS NOTE    [name]  TKZ:[id number] DOB: Aug 03, 1961 DOA: 02/05/2022 PCP: Loyal Jacobson, MD   Brief Narrative:  61 year old with past medical history significant for anxiety, depression, history of cellulitis, chronic diastolic heart failure, smoker, Concussion, COPD, hyperlipidemia, Hypertension, Hypothyroidism, history of MRSA positive culture, OSA on CPAP, peripheral vascular disease, TIA, diabetes type 2, obesity who presents to the emergency department complaining of worsening sacral ulcer for the past 3 weeks, draining a full smelling purulent discharge.  Per home health nurse patient has had decline in mental status.  Patient report polyuria and polydipsia.    Evaluation in the ED CT abdomen and pelvis show extensive soft tissue edema and inflammation throughout the left gluteal region including several foci of subcutaneous air.  There is also irregular tract like density extending toward the nerves which could potentially represent a fistula although not well evaluated.  UA: 50 white blood cell, white count 11.9, hemoglobin 9.6, glucose 275, creatinine 2.2.  Patient admitted with worsening cellulitis and wound sacral region, AKI on CKD stage IIIb.  General surgery consulted and planning for I&D 6/19  Assessment & Plan:   Principal Problem:   Cellulitis of sacral region Active Problems:   HTN (hypertension)   Hypothyroidism   Type 2 diabetes mellitus with hyperglycemia (HCC)   Depression   Chronic diastolic heart failure (HCC)   OSA on CPAP with oxygen   COPD (chronic obstructive pulmonary disease), on home O2   Stage 3b chronic kidney disease (CKD) (HCC)   AKI (acute kidney injury) (HCC)   GERD (gastroesophageal reflux disease)   Hyperlipidemia   Sacral decubitus  wound infection , Necrotizing soft tissue infection:  General surgery is following and managing.  Underwent debridement on 6/19.  It appears that the plan is for another debridement today. Patient  noted to be on vancomycin and meropenem. Blood cultures have been negative.  Wound culture showing multiple organisms. Allergy reported to penicillin.  She has tolerated cephalosporins previously it may be reasonable to transition to cephalosporin metronidazole.  Will discuss with pharmacy. MRSA PCR was negative.  AKI on CKD stage IIIb: Prior Cr per record: 1.5. Cr Peak to: 2.1--2.0 Creatinine improving with IV hydration.  Down to 1.63.  Monitor urine output.  Avoid nephrotoxic agent. Potassium improved to 4.6  Essential hypertension:  Continue with Carvedilol.  Hold Lisinopril due to AKI.   Hypothyroidism: Continue with Synthroid.   Normocytic Anemia;  Drop in hemoglobin likely dilutional.  No evidence of overt bleeding. Hemoglobin stable this morning.  Anemia panel reviewed.  Ferritin 331, iron 16, TIBC 216, percent saturation 7, folate 9.8, vitamin B12 1927.  Diabetes type 2 with hyperglycemia uncontrolled HbA1c 12.6.  Noted to be on glargine and SSI.  CBGs poorly controlled as of yesterday.  Noted to be 165 this morning.  Continue to monitor trend and will make adjustments to her regimen tomorrow depending on glucose levels over the next 24 hours.    Depression:  Prozac   Chronic diastolic Heart failure;  Holding diuretics.  Holding Farxiga die to AKI.   Obstructive sleep apnea On CPAP. She has been refusing this.  PVD Suspect she is on plavix for PVD  COPD PRN nebulizer.   GERD:  PPI.   Hyperlipidemia:  Continue with Crestor.   Obesity Estimated body mass index is 31.37 kg/m as calculated from the following:   Height as of this encounter: 5\' 5"  (1.651 m).   Weight as of this encounter: 85.5 kg.  DVT prophylaxis: Lovenox Code Status: Full code Family Communication: No family at bedside.  Disposition Plan: SNF recommended by physical therapy  Status is: Inpatient Remains inpatient appropriate because: management of decubitus wound infection.      Consultants:  General Surgery   Procedures:  Excisional debridement 6/19  Antimicrobials:    Subjective: Somnolent this morning but easily arousable.  No complaints offered.  Pain is reasonably well controlled.  Objective: Vitals:   02/07/22 1920 02/08/22 0308 02/08/22 0308 02/08/22 0900  BP: (!) 121/49 (!) 165/63 (!) 165/63 (!) 111/30  Pulse: 70 81 81 69  Resp:  16 16   Temp: 97.8 F (36.6 C) 99.2 F (37.3 C) 99.2 F (37.3 C)   TempSrc: Oral Oral Oral   SpO2: 96% 95% 95%   Weight:      Height:        Intake/Output Summary (Last 24 hours) at 02/08/2022 1049 Last data filed at 02/08/2022 3762 Gross per 24 hour  Intake 2037.28 ml  Output 2050 ml  Net -12.72 ml    Filed Weights   02/05/22 1508 02/05/22 2100 02/06/22 0950  Weight: 90.4 kg 85.5 kg 85.5 kg    Examination:  General appearance: Somnolent but easily arousable.  No distress Resp: Clear to auscultation bilaterally.  Normal effort Cardio: S1-S2 is normal regular.  No S3-S4.  No rubs murmurs or bruit GI: Abdomen is soft.  Nontender nondistended.  Bowel sounds are present normal.  No masses organomegaly Extremities: Status post bilateral lower extremity amputations Neurologic: No focal neurological deficits.      Data Reviewed: I have personally reviewed following labs and imaging studies  CBC: Recent Labs  Lab 02/05/22 1149 02/06/22 0427 02/07/22 0705 02/08/22 0646  WBC 11.9* 11.3* 9.5 7.0  NEUTROABS 9.8*  --   --   --   HGB 9.6* 8.2* 7.7* 8.4*  HCT 30.3* 26.0* 25.2* 27.7*  MCV 85.4 87.0 90.0 89.4  PLT 429* 368 330 338    Basic Metabolic Panel: Recent Labs  Lab 02/05/22 1149 02/06/22 0427 02/07/22 0705 02/08/22 0646  NA 135 135 139 139  K 4.8 4.8 5.1 4.6  CL 103 104 108 109  CO2 18* 19* 19* 21*  GLUCOSE 275* 271* 308* 179*  BUN 107* 87* 98* 79*  CREATININE 2.21* 2.19* 2.01* 1.63*  CALCIUM 9.6 9.1 9.0 8.6*  MG  --   --  2.6*  --     GFR: Estimated Creatinine Clearance:  39.6 mL/min (A) (by C-G formula based on SCr of 1.63 mg/dL (H)). Liver Function Tests: Recent Labs  Lab 02/05/22 1149 02/06/22 0427  AST 28 22  ALT 39 31  ALKPHOS 137* 131*  BILITOT 0.7 0.9  PROT 7.3 6.4*  ALBUMIN 2.6* 2.2*    HbA1C: Recent Labs    02/06/22 0427  HGBA1C 12.6*    CBG: Recent Labs  Lab 02/07/22 0908 02/07/22 1157 02/07/22 1652 02/07/22 2101 02/08/22 0811  GLUCAP 245* 248* 254* 257* 165*    Anemia Panel: Recent Labs    02/08/22 0646  VITAMINB12 1,927*  FOLATE 9.8  FERRITIN 331*  TIBC 216*  IRON 16*  RETICCTPCT 1.4   Sepsis Labs: Recent Labs  Lab 02/05/22 1315  LATICACIDVEN 1.4     Recent Results (from the past 240 hour(s))  Blood culture (routine x 2)     Status: None   Collection Time: 01/29/22  2:58 PM   Specimen: BLOOD LEFT HAND  Result Value Ref Range Status   Specimen Description  BLOOD LEFT HAND  Final   Special Requests   Final    BOTTLES DRAWN AEROBIC ONLY Blood Culture results may not be optimal due to an inadequate volume of blood received in culture bottles   Culture   Final    NO GROWTH 5 DAYS Performed at [facility], [place] [address] [place], [place] [address]    Report Status 02/03/2022 FINAL  Final  Blood culture (routine x 2)     Status: None   Collection Time: 01/29/22  6:15 PM   Specimen: BLOOD  Result Value Ref Range Status   Specimen Description BLOOD RIGHT ANTECUBITAL  Final   Special Requests   Final    BOTTLES DRAWN AEROBIC AND ANAEROBIC Blood Culture adequate volume   Culture   Final    NO GROWTH 5 DAYS Performed at [facility], [place] [address], [place], [place] [address]    Report Status 02/03/2022 FINAL  Final  MRSA Next Gen by PCR, Nasal     Status: None   Collection Time: 02/06/22  6:40 AM   Specimen: Nasal Mucosa; Nasal Swab  Result Value Ref Range Status   MRSA by PCR Next Gen NOT DETECTED NOT DETECTED Final    Comment: (NOTE) The GeneXpert MRSA Assay (FDA approved for NASAL  specimens only), is one component of a comprehensive MRSA colonization surveillance program. It is not intended to diagnose MRSA infection nor to guide or monitor treatment for MRSA infections. Test performance is not FDA approved in patients less than 73 years old. Performed at [facility], [place] [address], [place], [place] [address]   Aerobic/Anaerobic Culture w Gram Stain (surgical/deep wound)     Status: Abnormal (Preliminary result)   Collection Time: 02/06/22 11:33 AM   Specimen: PATH Other; Tissue  Result Value Ref Range Status   Specimen Description   Final    ABSCESS GLUTEAL ABSCESS Performed at [place] [address], [place], [place] [address]    Special Requests   Final    NONE Performed at [facility] [place] [address], [place], [place] [address]    Gram Stain   Final    FEW WBC PRESENT, PREDOMINANTLY PMN ABUNDANT GRAM POSITIVE RODS Performed at [facility], [place] [address], [place], [place] [address]    Culture MULTIPLE ORGANISMS PRESENT, NONE PREDOMINANT (A)  Final   Report Status PENDING  Incomplete         Radiology Studies: No results found.      Scheduled Meds:  acetaminophen  1,000 mg Oral Q6H   carvedilol  3.125 mg Oral BID WC   Chlorhexidine Gluconate Cloth  6 each Topical Daily   clopidogrel  75 mg Oral q morning   enoxaparin (LOVENOX) injection  30 mg Subcutaneous Q24H   FLUoxetine  10 mg Oral q morning   insulin aspart  0-15 Units Subcutaneous TID WC   insulin glargine-yfgn  10 Units Subcutaneous QHS   insulin glargine-yfgn  15 Units Subcutaneous Daily   levothyroxine  50 mcg Oral Q0600   pantoprazole  40 mg Oral Daily   rosuvastatin  40 mg Oral Daily   saccharomyces boulardii  250 mg Oral BID   Continuous Infusions:  sodium chloride 100 mL/hr at 02/08/22 0913   meropenem (MERREM) IV 1 g (02/08/22 0507)   vancomycin Stopped (02/07/22 1334)     LOS: 3 days     Bonnielee Haff, MD [facility]   If  7PM-7AM, please contact night-coverage [email or web address]  02/08/2022, 10:49 AM

## 2022-02-09 ENCOUNTER — Encounter (HOSPITAL_COMMUNITY): Payer: Self-pay | Admitting: General Surgery

## 2022-02-09 DIAGNOSIS — E1165 Type 2 diabetes mellitus with hyperglycemia: Secondary | ICD-10-CM | POA: Diagnosis not present

## 2022-02-09 DIAGNOSIS — L03319 Cellulitis of trunk, unspecified: Secondary | ICD-10-CM | POA: Diagnosis not present

## 2022-02-09 DIAGNOSIS — I10 Essential (primary) hypertension: Secondary | ICD-10-CM | POA: Diagnosis not present

## 2022-02-09 DIAGNOSIS — N179 Acute kidney failure, unspecified: Secondary | ICD-10-CM | POA: Diagnosis not present

## 2022-02-09 LAB — GLUCOSE, CAPILLARY
Glucose-Capillary: 196 mg/dL — ABNORMAL HIGH (ref 70–99)
Glucose-Capillary: 203 mg/dL — ABNORMAL HIGH (ref 70–99)
Glucose-Capillary: 224 mg/dL — ABNORMAL HIGH (ref 70–99)
Glucose-Capillary: 178 mg/dL — ABNORMAL HIGH (ref 70–99)

## 2022-02-09 LAB — CBC
HCT: 25.2 % — ABNORMAL LOW (ref 36.0–46.0)
Hemoglobin: 7.4 g/dL — ABNORMAL LOW (ref 12.0–15.0)
MCH: 27 pg (ref 26.0–34.0)
MCHC: 29.4 g/dL — ABNORMAL LOW (ref 30.0–36.0)
MCV: 92 fL (ref 80.0–100.0)
Platelets: 289 K/uL (ref 150–400)
RBC: 2.74 MIL/uL — ABNORMAL LOW (ref 3.87–5.11)
RDW: 14.2 % (ref 11.5–15.5)
WBC: 8.8 K/uL (ref 4.0–10.5)
nRBC: 0 % (ref 0.0–0.2)

## 2022-02-09 LAB — BASIC METABOLIC PANEL
Anion gap: 7 (ref 5–15)
BUN: 69 mg/dL — ABNORMAL HIGH (ref 6–20)
CO2: 18 mmol/L — ABNORMAL LOW (ref 22–32)
Calcium: 8.3 mg/dL — ABNORMAL LOW (ref 8.9–10.3)
Chloride: 118 mmol/L — ABNORMAL HIGH (ref 98–111)
Creatinine, Ser: 1.52 mg/dL — ABNORMAL HIGH (ref 0.44–1.00)
GFR, Estimated: 39 mL/min — ABNORMAL LOW (ref >60)
Glucose, Bld: 210 mg/dL — ABNORMAL HIGH (ref 70–99)
Potassium: 4.7 mmol/L (ref 3.5–5.1)
Sodium: 143 mmol/L (ref 135–145)

## 2022-02-09 MED ORDER — VANCOMYCIN HCL 750 MG/150ML IV SOLN
750.0000 mg | INTRAVENOUS | Status: DC
Start: 2022-02-10 — End: 2022-02-12
  Administered 2022-02-10 – 2022-02-11 (×2): 750 mg via INTRAVENOUS
  Filled 2022-02-09 (×3): qty 150

## 2022-02-09 NOTE — Anesthesia Postprocedure Evaluation (Signed)
Anesthesia Post Note  Patient: [name]  Procedure(s) Performed: INCISION AND DRAINAGE ABSCESS PERINEUM     Patient location during evaluation: PACU Anesthesia Type: General Level of consciousness: awake Pain management: pain level controlled Vital Signs Assessment: post-procedure vital signs reviewed and stable Respiratory status: spontaneous breathing, nonlabored ventilation, respiratory function stable and patient connected to nasal cannula oxygen Cardiovascular status: blood pressure returned to baseline and stable Postop Assessment: no apparent nausea or vomiting Anesthetic complications: no   No notable events documented.  Last Vitals:  Vitals:   02/09/22 0825 02/09/22 1335  BP: (!) 127/54 (!) 143/65  Pulse: 77 71  Resp: 15 16  Temp: 36.4 C 36.4 C  SpO2:  97%    Last Pain:  Vitals:   02/09/22 1725  TempSrc:   PainSc: 2                  Seferino Oscar P Camellia Popescu

## 2022-02-09 NOTE — TOC Progression Note (Addendum)
Transition of Care [facility]) - Progression Note    Patient Details  Name: [name] MRN: [id number] Date of Birth: 1961/01/29  Transition of Care [facility]) CM/SW [place], LCSW Phone Number: 02/09/2022, 3:13 PM  Clinical Narrative:    Met with pt briefly and obtained verbal consent to reach out to brother regarding discharge planning. Pt currently lethargic and is disoriented to time and place.  CSW spoke with pt's brother, Mikinzie Maciejewski ([phone]) to discuss recommendations and discharge plans. CSW informed pt's brother that this pt is currently not fully oriented. Pt's brother shared that he had a conversation with pt "a few weeks ago" to discuss SNF placement and states that this conversation did not "go well." Pt's brother is an agreement for pt to be referred for SNF placement.   Pt has been referred out for SNF placement and currently awaiting bed offers.   Expected Discharge Plan: Home/Self Care Barriers to Discharge: Continued Medical Work up  Expected Discharge Plan and Services Expected Discharge Plan: Home/Self Care In-house Referral: Clinical Social Work Discharge Planning Services: CM Consult Post Acute Care Choice: NA Living arrangements for the past 2 months: Apartment                 DME Arranged: N/A DME Agency: NA                   Social Determinants of Health (SDOH) Interventions    Readmission Risk Interventions    02/07/2022   12:17 PM  Readmission Risk Prevention Plan  Transportation Screening Complete  PCP or Specialist Appt within 5-7 Days Complete  Home Care Screening Complete  Medication Review (RN CM) Complete

## 2022-02-09 NOTE — Progress Notes (Signed)
PT Cancellation Note  Patient Details Name: [name] MRN: [id number] DOB: 1961/05/26   Cancelled Treatment:    Reason Eval/Treat Not Completed: Pain limiting ability to participate;Fatigue/lethargy limiting ability to participate (pt was sleeping soundly upon my arrival, she aroused to verbal stimulation but was lethargic and dosed off quickly. She declined PT 2* pain. Will follow.)   Tamala Ser PT 02/09/2022  Acute Rehabilitation Services  Office [phone]

## 2022-02-09 NOTE — Progress Notes (Signed)
1 Day Post-Op  Subjective: CC: Stable pain over wound. No other complaints.   Objective: Vital signs in last 24 hours: Temp:  [97.6 F (36.4 C)-99.8 F (37.7 C)] 97.6 F (36.4 C) (06/22 0825) Pulse Rate:  [63-80] 77 (06/22 0825) Resp:  [9-15] 15 (06/22 0825) BP: (109-169)/(41-119) 127/54 (06/22 0825) SpO2:  [95 %-99 %] 97 % (06/22 0458) Weight:  [85.5 kg] 85.5 kg (06/21 1309) Last BM Date : 02/08/22  Intake/Output from previous day: 06/21 0701 - 06/22 0700 In: 2361.2 [I.V.:2261.2; IV Piggyback:100] Out: 1695 [Urine:1675; Blood:20] Intake/Output this shift: No intake/output data recorded.  PE: Gen:  Alert, NAD, pleasant Buttock: dressing in place and clean Abd: Soft, NT GU: foley in place with straw colored urine  Lab Results:  Recent Labs    02/08/22 0646 02/09/22 0550  WBC 7.0 8.8  HGB 8.4* 7.4*  HCT 27.7* 25.2*  PLT 338 289   BMET Recent Labs    02/08/22 0646 02/09/22 0550  NA 139 143  K 4.6 4.7  CL 109 118*  CO2 21* 18*  GLUCOSE 179* 210*  BUN 79* 69*  CREATININE 1.63* 1.52*  CALCIUM 8.6* 8.3*   PT/INR No results for input(s): "LABPROT", "INR" in the last 72 hours. CMP     Component Value Date/Time   NA 143 02/09/2022 0550   K 4.7 02/09/2022 0550   CL 118 (H) 02/09/2022 0550   CO2 18 (L) 02/09/2022 0550   GLUCOSE 210 (H) 02/09/2022 0550   BUN 69 (H) 02/09/2022 0550   CREATININE 1.52 (H) 02/09/2022 0550   CALCIUM 8.3 (L) 02/09/2022 0550   PROT 6.4 (L) 02/06/2022 0427   ALBUMIN 2.2 (L) 02/06/2022 0427   AST 22 02/06/2022 0427   ALT 31 02/06/2022 0427   ALKPHOS 131 (H) 02/06/2022 0427   BILITOT 0.9 02/06/2022 0427   GFRNONAA 39 (L) 02/09/2022 0550   GFRAA 43 (L) 02/27/2020 1216   Lipase     Component Value Date/Time   LIPASE 38 12/05/2012 0317    Studies/Results: No results found.  Anti-infectives: Anti-infectives (From admission, onward)    Start     Dose/Rate Route Frequency Ordered Stop   02/10/22 1800  vancomycin  (VANCOREADY) IVPB 750 mg/150 mL        750 mg 150 mL/hr over 60 Minutes Intravenous Every 24 hours 02/09/22 0738     02/08/22 1700  cefTRIAXone (ROCEPHIN) 2 g in sodium chloride 0.9 % 100 mL IVPB        2 g 200 mL/hr over 30 Minutes Intravenous Every 24 hours 02/08/22 1114     02/08/22 1700  metroNIDAZOLE (FLAGYL) tablet 500 mg        500 mg Oral Every 12 hours 02/08/22 1114     02/07/22 1200  vancomycin (VANCOCIN) IVPB 1000 mg/200 mL premix        1,000 mg 200 mL/hr over 60 Minutes Intravenous Every 48 hours 02/05/22 1514 02/11/22 1159   02/06/22 0400  meropenem (MERREM) 1 g in sodium chloride 0.9 % 100 mL IVPB  Status:  Discontinued        1 g 200 mL/hr over 30 Minutes Intravenous Every 12 hours 02/05/22 1514 02/08/22 1114   02/05/22 1415  vancomycin (VANCOCIN) IVPB 1000 mg/200 mL premix        1,000 mg 200 mL/hr over 60 Minutes Intravenous  Once 02/05/22 1413 02/05/22 2033   02/05/22 1415  meropenem (MERREM) 1 g in sodium chloride 0.9 % 100 mL IVPB  1 g 200 mL/hr over 30 Minutes Intravenous  Once 02/05/22 1413 02/05/22 2032        Assessment/Plan POD 3 & 1 s/p excisional debridement of NSTI of perineum and buttock, Dr. Dwain Sarna 6/19 & 6/21 - leave dressing in place today - will plan to return to OR tomorrow for another look and further debridement if necessary - discussed with patient and she is agreeable - cont abx therapy - cultures with mult org. Cont broad spect abx. Consider ID consult for recs - Consider replacement of rectal tube if patient continues to have diarrhea causing soilage of dressing/wound. No abd ttp and wbc wnl   FEN - carb mod, NPO p MN VTE - SCDs, Lovenox. Please hold plavix (this was restarted on 6/21) ID - Rocephin/Flagyl/Vanc. Afebrile. WBC wnl Foley - in place  I reviewed hospitalist notes, last 24 h vitals and pain scores, last 48 h intake and output, last 24 h labs and trends, and last 24 h imaging results.   PVD with B BKAs Diastolic  HF COPD DM HTN HLD GERD TIA   LOS: 4 days    Jacinto Halim , [facility] Surgery 02/09/2022, 9:22 AM Please see Amion for pager number during day hours 7:00am-4:30pm

## 2022-02-09 NOTE — Progress Notes (Signed)
Pharmacy Antibiotic Note  [name] is a 61 y.o. female admitted on 02/05/2022 with weakness.  Patient has a PMH including bilateral BKA, DM, and cellulitis.  Noted by home health nurse that known decubitus ulcer is larger. Now s/p I&D 6/19, excisional debridement 6/21. Pharmacy has been consulted for vancomycin dosing for cellulitis.  Day #5 abx Tmax 99.1 WBC WNL SCr improved 1.52  Plan: Give 1g IV x 1 today, then adjust to 750mg  IV q48h starting 6/22 (eAUC 449, Scr 2.21, Vd 0.5) Continue Ceftriaxone/Flagyl per MD  Height: 5\' 5"  (165.1 cm) Weight: 85.5 kg (188 lb 7.9 oz) IBW/kg (Calculated) : 57  Temp (24hrs), Avg:98.7 F (37.1 C), Min:97.9 F (36.6 C), Max:99.8 F (37.7 C)  Recent Labs  Lab 02/05/22 1149 02/05/22 1315 02/06/22 0427 02/07/22 0705 02/08/22 0646 02/09/22 0550  WBC 11.9*  --  11.3* 9.5 7.0 8.8  CREATININE 2.21*  --  2.19* 2.01* 1.63* 1.52*  LATICACIDVEN  --  1.4  --   --   --   --      Estimated Creatinine Clearance: 42.5 mL/min (A) (by C-G formula based on SCr of 1.52 mg/dL (H)).    Allergies  Allergen Reactions   Penicillins Hives    Has patient had a PCN reaction causing immediate rash, facial/tongue/throat swelling, SOB or lightheadedness with hypotension: No Has patient had a PCN reaction causing severe rash involving mucus membranes or skin necrosis: No Has patient had a PCN reaction that required hospitalization No Has patient had a PCN reaction occurring within the last 10 years: No If all of the above answers are "NO", then may proceed with Cephalosporin use.    Dulaglutide Diarrhea    Trulicity   Erythromycin Diarrhea   Gadolinium Nausea And Vomiting     Code: VOM, Desc: Pt began vomiting immed post infusion of multihance, Onset Date: 02/10/22    Iodine-131 Nausea And Vomiting   Ivp Dye [Iodinated Contrast Media] Nausea And Vomiting        Metformin Diarrhea    Antimicrobials this admission:  6/18 Vanc >> 6/18 Meropenem >>  6/21  6/21 Ceftriaxone > 6/21 Flagyl>   Dose adjustments this admission:  6/22 Adjust vanc from 1g q48h to 750mg  q24h   Microbiology results:  6/19 gluteal abscess: abundant GPR on gram stain, cx pending > mult organisms none predominant 6/19 MRSA PCR: neg  Thank you for allowing pharmacy to be a part of this patient's care.  , PharmD, BCPS Pharmacy: [phone] 02/09/2022 7:40 AM

## 2022-02-09 NOTE — Progress Notes (Signed)
PROGRESS NOTE    [name]  KGM:[id number] DOB: July 22, 1961 DOA: 02/05/2022 PCP: Loyal Jacobson, MD   Brief Narrative:  61 year old with past medical history significant for anxiety, depression, history of cellulitis, chronic diastolic heart failure, smoker, Concussion, COPD, hyperlipidemia, Hypertension, Hypothyroidism, history of MRSA positive culture, OSA on CPAP, peripheral vascular disease, TIA, diabetes type 2, obesity who presents to the emergency department complaining of worsening sacral ulcer for the past 3 weeks, draining a full smelling purulent discharge.  Per home health nurse patient has had decline in mental status.  Patient report polyuria and polydipsia.    Evaluation in the ED CT abdomen and pelvis show extensive soft tissue edema and inflammation throughout the left gluteal region including several foci of subcutaneous air.  There is also irregular tract like density extending toward the nerves which could potentially represent a fistula although not well evaluated.  UA: 50 white blood cell, white count 11.9, hemoglobin 9.6, glucose 275, creatinine 2.2.  Patient admitted with worsening cellulitis and wound sacral region, AKI on CKD stage IIIb.  General surgery consulted and planning for I&D 6/19  Assessment & Plan:   Principal Problem:   Cellulitis of sacral region Active Problems:   HTN (hypertension)   Hypothyroidism   Type 2 diabetes mellitus with hyperglycemia (HCC)   Depression   Chronic diastolic heart failure (HCC)   OSA on CPAP with oxygen   COPD (chronic obstructive pulmonary disease), on home O2   Stage 3b chronic kidney disease (CKD) (HCC)   AKI (acute kidney injury) (HCC)   GERD (gastroesophageal reflux disease)   Hyperlipidemia   Sacral decubitus  wound infection , Necrotizing soft tissue infection:  General surgery is following and managing.  Underwent debridement on 6/19 and 6/21.  They may do another debridement tomorrow as per their  notes. Patient was on vancomycin and meropenem.  Blood cultures have been negative.  Wound cultures with multiple organisms. Meropenem was changed over to ceftriaxone and metronidazole after discussions with pharmacy. Patient noted to be on vancomycin and meropenem. Blood cultures have been negative.  Wound cultures with abundant gram-positive rods.  Wait on final identification and sensitivities.  May need to involve ID but will hold off for now. MRSA PCR was negative.  AKI on CKD stage IIIb: Prior Cr per record: 1.5. Cr Peak to: 2.1--2.0 Creatinine improving with IV hydration.  Continue to monitor urine output.  Avoid nephrotoxic agents.  Potassium is stable.  Essential hypertension:  Continue with Carvedilol.  Hold Lisinopril due to AKI.  Blood pressure is reasonably well controlled.  Hypothyroidism: Continue with Synthroid.   Normocytic Anemia;  Drop in hemoglobin likely dilutional.  No evidence of overt bleeding. Anemia panel reviewed.  Ferritin 331, iron 16, TIBC 216, percent saturation 7, folate 9.8, vitamin B12 1927.  Diabetes type 2 with hyperglycemia uncontrolled HbA1c 12.6.  Noted to be on glargine and SSI.   CBGs have been reasonably well controlled in the last 24 hours.  Continue current dose of insulin for now.    Depression:  Prozac   Chronic diastolic Heart failure;  Holding diuretics.  Holding Farxiga die to AKI.   Obstructive sleep apnea On CPAP. She has been refusing this.  PVD Suspect she is on plavix for PVD  COPD PRN nebulizer.   GERD:  PPI.   Hyperlipidemia:  Continue with Crestor.   Obesity Estimated body mass index is 31.37 kg/m as calculated from the following:   Height as of this encounter: 5\' 5"  (1.651  m).   Weight as of this encounter: 85.5 kg.   DVT prophylaxis: Lovenox Code Status: Full code Family Communication: No family at bedside.  Disposition Plan: SNF recommended by physical therapy  Status is: Inpatient Remains  inpatient appropriate because: management of decubitus wound infection.     Consultants:  General Surgery   Procedures:  Excisional debridement 6/19  Antimicrobials:    Subjective: Somnolent but easily arousable.  Complains of pain in the back.  No nausea vomiting.  Objective: Vitals:   02/08/22 1652 02/08/22 2012 02/09/22 0458 02/09/22 0825  BP: (!) 112/56 (!) 109/41 125/62 (!) 127/54  Pulse: 64 80 63 77  Resp: 12 14 14 15   Temp: 99.8 F (37.7 C) 98.1 F (36.7 C) 97.9 F (36.6 C) 97.6 F (36.4 C)  TempSrc: Oral Oral Oral Axillary  SpO2: 99% 95% 97%   Weight:      Height:        Intake/Output Summary (Last 24 hours) at 02/09/2022 1037 Last data filed at 02/09/2022 0300 Gross per 24 hour  Intake 2361.23 ml  Output 845 ml  Net 1516.23 ml    Filed Weights   02/05/22 2100 02/06/22 0950 02/08/22 1309  Weight: 85.5 kg 85.5 kg 85.5 kg    Examination:  General appearance: Somnolent but easily arousable. Resp: Clear to auscultation bilaterally.  Normal effort Cardio: S1-S2 is normal regular.  No S3-S4.  No rubs murmurs or bruit GI: Abdomen is soft.  Nontender nondistended.  Bowel sounds are present normal.  No masses organomegaly Back: Sacral area covered in dressing. Extremities: She is status post bilateral lower extremity amputations previous Neurologic: No focal neurological deficits.       Data Reviewed: I have personally reviewed following labs and imaging studies  CBC: Recent Labs  Lab 02/05/22 1149 02/06/22 0427 02/07/22 0705 02/08/22 0646 02/09/22 0550  WBC 11.9* 11.3* 9.5 7.0 8.8  NEUTROABS 9.8*  --   --   --   --   HGB 9.6* 8.2* 7.7* 8.4* 7.4*  HCT 30.3* 26.0* 25.2* 27.7* 25.2*  MCV 85.4 87.0 90.0 89.4 92.0  PLT 429* 368 330 338 289    Basic Metabolic Panel: Recent Labs  Lab 02/05/22 1149 02/06/22 0427 02/07/22 0705 02/08/22 0646 02/09/22 0550  NA 135 135 139 139 143  K 4.8 4.8 5.1 4.6 4.7  CL 103 104 108 109 118*  CO2 18* 19*  19* 21* 18*  GLUCOSE 275* 271* 308* 179* 210*  BUN 107* 87* 98* 79* 69*  CREATININE 2.21* 2.19* 2.01* 1.63* 1.52*  CALCIUM 9.6 9.1 9.0 8.6* 8.3*  MG  --   --  2.6*  --   --     GFR: Estimated Creatinine Clearance: 42.5 mL/min (A) (by C-G formula based on SCr of 1.52 mg/dL (H)). Liver Function Tests: Recent Labs  Lab 02/05/22 1149 02/06/22 0427  AST 28 22  ALT 39 31  ALKPHOS 137* 131*  BILITOT 0.7 0.9  PROT 7.3 6.4*  ALBUMIN 2.6* 2.2*     CBG: Recent Labs  Lab 02/08/22 1139 02/08/22 1545 02/08/22 1711 02/08/22 2135 02/09/22 0802  GLUCAP 128* 136* 156* 163* 196*    Anemia Panel: Recent Labs    02/08/22 0646  VITAMINB12 1,927*  FOLATE 9.8  FERRITIN 331*  TIBC 216*  IRON 16*  RETICCTPCT 1.4    Sepsis Labs: Recent Labs  Lab 02/05/22 1315  LATICACIDVEN 1.4     Recent Results (from the past 240 hour(s))  MRSA Next Gen by  PCR, Nasal     Status: None   Collection Time: 02/06/22  6:40 AM   Specimen: Nasal Mucosa; Nasal Swab  Result Value Ref Range Status   MRSA by PCR Next Gen NOT DETECTED NOT DETECTED Final    Comment: (NOTE) The GeneXpert MRSA Assay (FDA approved for NASAL specimens only), is one component of a comprehensive MRSA colonization surveillance program. It is not intended to diagnose MRSA infection nor to guide or monitor treatment for MRSA infections. Test performance is not FDA approved in patients less than 70 years old. Performed at [facility], [place] [address] [place], [place] [address]   Aerobic/Anaerobic Culture w Gram Stain (surgical/deep wound)     Status: None (Preliminary result)   Collection Time: 02/06/22 11:33 AM   Specimen: PATH Other; Tissue  Result Value Ref Range Status   Specimen Description   Final    ABSCESS GLUTEAL ABSCESS Performed at [place] [address], [place], [place] [address]    Special Requests   Final    NONE Performed at [facility],  [place] [address] [place], [place] [address]    Gram Stain   Final    FEW WBC PRESENT, PREDOMINANTLY PMN ABUNDANT GRAM POSITIVE RODS Performed at [facility], [place] [address], [place],  [address]    Culture   Final    CULTURE REINCUBATED FOR BETTER GROWTH NO ANAEROBES ISOLATED; CULTURE IN PROGRESS FOR 5 DAYS    Report Status PENDING  Incomplete         Radiology Studies: No results found.      Scheduled Meds:  acetaminophen  1,000 mg Oral Q6H   carvedilol  3.125 mg Oral BID WC   Chlorhexidine Gluconate Cloth  6 each Topical Daily   enoxaparin (LOVENOX) injection  30 mg Subcutaneous Q24H   FLUoxetine  10 mg Oral q morning   insulin aspart  0-15 Units Subcutaneous TID WC   insulin glargine-yfgn  10 Units Subcutaneous QHS   insulin glargine-yfgn  15 Units Subcutaneous Daily   levothyroxine  50 mcg Oral Q0600   metroNIDAZOLE  500 mg Oral Q12H   pantoprazole  40 mg Oral Daily   rosuvastatin  40 mg Oral Daily   saccharomyces boulardii  250 mg Oral BID   Continuous Infusions:  sodium chloride 75 mL/hr at 02/09/22 0219   cefTRIAXone (ROCEPHIN)  IV Stopped (02/08/22 1745)   vancomycin Stopped (02/07/22 1334)   [START ON 02/10/2022] vancomycin       LOS: 4 days    Bonnielee Haff, MD [facility]   If 7PM-7AM, please contact night-coverage [email or web address]  02/09/2022, 10:37 AM

## 2022-02-10 DIAGNOSIS — E1165 Type 2 diabetes mellitus with hyperglycemia: Secondary | ICD-10-CM | POA: Diagnosis not present

## 2022-02-10 DIAGNOSIS — I10 Essential (primary) hypertension: Secondary | ICD-10-CM | POA: Diagnosis not present

## 2022-02-10 DIAGNOSIS — Z794 Long term (current) use of insulin: Secondary | ICD-10-CM | POA: Diagnosis not present

## 2022-02-10 DIAGNOSIS — L03319 Cellulitis of trunk, unspecified: Secondary | ICD-10-CM | POA: Diagnosis not present

## 2022-02-10 LAB — BASIC METABOLIC PANEL
Anion gap: 6 (ref 5–15)
BUN: 61 mg/dL — ABNORMAL HIGH (ref 6–20)
CO2: 20 mmol/L — ABNORMAL LOW (ref 22–32)
Calcium: 8.3 mg/dL — ABNORMAL LOW (ref 8.9–10.3)
Chloride: 116 mmol/L — ABNORMAL HIGH (ref 98–111)
Creatinine, Ser: 1.4 mg/dL — ABNORMAL HIGH (ref 0.44–1.00)
GFR, Estimated: 43 mL/min — ABNORMAL LOW (ref >60)
Glucose, Bld: 174 mg/dL — ABNORMAL HIGH (ref 70–99)
Potassium: 4.6 mmol/L (ref 3.5–5.1)
Sodium: 142 mmol/L (ref 135–145)

## 2022-02-10 LAB — GLUCOSE, CAPILLARY
Glucose-Capillary: 134 mg/dL — ABNORMAL HIGH (ref 70–99)
Glucose-Capillary: 103 mg/dL — ABNORMAL HIGH (ref 70–99)
Glucose-Capillary: 127 mg/dL — ABNORMAL HIGH (ref 70–99)
Glucose-Capillary: 113 mg/dL — ABNORMAL HIGH (ref 70–99)

## 2022-02-10 LAB — CBC
HCT: 25.2 % — ABNORMAL LOW (ref 36.0–46.0)
Hemoglobin: 7.5 g/dL — ABNORMAL LOW (ref 12.0–15.0)
MCH: 27 pg (ref 26.0–34.0)
MCHC: 29.8 g/dL — ABNORMAL LOW (ref 30.0–36.0)
MCV: 90.6 fL (ref 80.0–100.0)
Platelets: 292 10*3/uL (ref 150–400)
RBC: 2.78 MIL/uL — ABNORMAL LOW (ref 3.87–5.11)
RDW: 14.2 % (ref 11.5–15.5)
WBC: 8 10*3/uL (ref 4.0–10.5)
nRBC: 0 % (ref 0.0–0.2)

## 2022-02-10 MED ORDER — ENOXAPARIN SODIUM 40 MG/0.4ML IJ SOSY
40.0000 mg | PREFILLED_SYRINGE | INTRAMUSCULAR | Status: DC
  Administered 2022-02-10 – 2022-02-14 (×5): 40 mg via SUBCUTANEOUS
  Filled 2022-02-10 (×5): qty 0.4

## 2022-02-10 MED ORDER — COLLAGENASE 250 UNIT/GM EX OINT
TOPICAL_OINTMENT | Freq: Two times a day (BID) | CUTANEOUS | Status: DC
  Filled 2022-02-10 (×2): qty 30

## 2022-02-10 NOTE — Progress Notes (Signed)
Patient refuses CPAP 

## 2022-02-11 DIAGNOSIS — Z794 Long term (current) use of insulin: Secondary | ICD-10-CM | POA: Diagnosis not present

## 2022-02-11 DIAGNOSIS — L03319 Cellulitis of trunk, unspecified: Secondary | ICD-10-CM | POA: Diagnosis not present

## 2022-02-11 DIAGNOSIS — I10 Essential (primary) hypertension: Secondary | ICD-10-CM | POA: Diagnosis not present

## 2022-02-11 DIAGNOSIS — E1165 Type 2 diabetes mellitus with hyperglycemia: Secondary | ICD-10-CM | POA: Diagnosis not present

## 2022-02-11 LAB — GLUCOSE, CAPILLARY
Glucose-Capillary: 103 mg/dL — ABNORMAL HIGH (ref 70–99)
Glucose-Capillary: 101 mg/dL — ABNORMAL HIGH (ref 70–99)
Glucose-Capillary: 80 mg/dL (ref 70–99)
Glucose-Capillary: 148 mg/dL — ABNORMAL HIGH (ref 70–99)

## 2022-02-11 NOTE — Progress Notes (Signed)
PROGRESS NOTE    [name]  HKV:[id number] DOB: 07/20/1961 DOA: 02/05/2022 PCP: Loyal Jacobson, MD   Brief Narrative:  61 year old with past medical history significant for anxiety, depression, history of cellulitis, chronic diastolic heart failure, smoker, Concussion, COPD, hyperlipidemia, Hypertension, Hypothyroidism, history of MRSA positive culture, OSA on CPAP, peripheral vascular disease, TIA, diabetes type 2, obesity who presents to the emergency department complaining of worsening sacral ulcer for the past 3 weeks, draining a full smelling purulent discharge.  Per home health nurse patient has had decline in mental status.  Patient report polyuria and polydipsia.    Evaluation in the ED CT abdomen and pelvis show extensive soft tissue edema and inflammation throughout the left gluteal region including several foci of subcutaneous air.  There is also irregular tract like density extending toward the nerves which could potentially represent a fistula although not well evaluated.  UA: 50 white blood cell, white count 11.9, hemoglobin 9.6, glucose 275, creatinine 2.2.  Patient admitted with worsening cellulitis and wound sacral region, AKI on CKD stage IIIb.  General surgery consulted and planning for I&D 6/19   Assessment & Plan:   Principal Problem:   Cellulitis of sacral region Active Problems:   HTN (hypertension)   Hypothyroidism   Type 2 diabetes mellitus with hyperglycemia (HCC)   Depression   Chronic diastolic heart failure (HCC)   OSA on CPAP with oxygen   COPD (chronic obstructive pulmonary disease), on home O2   Stage 3b chronic kidney disease (CKD) (HCC)   AKI (acute kidney injury) (HCC)   GERD (gastroesophageal reflux disease)   Hyperlipidemia   Sacral decubitus  wound infection , Necrotizing soft tissue infection:  General surgery is following and managing.  Underwent debridement on 6/19 and 6/21.  No plans for further debridement noted at this time.   Continue wound Patient was on vancomycin and meropenem. Meropenem was changed over to ceftriaxone and metronidazole after discussions with pharmacy. Blood cultures have been negative.   Wound culture growing Proteus mirabilis and Staph aureus.  Waiting on final sensitivities. MRSA PCR was negative.  AKI on CKD stage IIIb: Baseline creatinine around 1.5.  Presented with creatinine around 2.  Improved with IV hydration.  Recheck labs tomorrow.  Potassium was stable as of yesterday. Foley catheter was discontinued on 6/23.  Essential hypertension:  Continue with Carvedilol.  Holding Lisinopril due to AKI.  Blood pressure is reasonably well controlled.  Hypothyroidism: Continue with Synthroid.   Normocytic Anemia;  Drop in hemoglobin likely dilutional.  No evidence of overt bleeding.  Hemoglobin is stable for the most part. Anemia panel reviewed.  Ferritin 331, iron 16, TIBC 216, percent saturation 7, folate 9.8, vitamin B12 1927.  Diabetes type 2 with hyperglycemia uncontrolled HbA1c 12.6.  Noted to be on glargine and SSI.   CBGs are being monitored.  Continue current regimen of insulin.  Depression:  Prozac   Chronic diastolic Heart failure;  Holding diuretics.  Holding Farxiga die to AKI.   Obstructive sleep apnea On CPAP. She has been refusing this.  PVD Suspect she is on plavix for PVD  COPD PRN nebulizer.   GERD:  PPI.   Hyperlipidemia:  Continue with Crestor.   Obesity Estimated body mass index is 31.37 kg/m as calculated from the following:   Height as of this encounter: 5\' 5"  (1.651 m).   Weight as of this encounter: 85.5 kg.   DVT prophylaxis: Lovenox Code Status: Full code Family Communication: No family at bedside.  Disposition  Plan: SNF recommended by physical therapy.  Discharge when cleared by general surgery and when antibiotic plan has been finalized.  Status is: Inpatient Remains inpatient appropriate because: management of decubitus wound  infection.     Consultants:  General Surgery   Procedures:  Excisional debridement 6/19  Antimicrobials:    Subjective: Patient awake alert.  Denies any complaints.  Specifically no pain in the back area.  No shortness of breath or chest pain.    Objective: Vitals:   02/10/22 0316 02/10/22 1546 02/10/22 1947 02/11/22 0521  BP: (!) 120/44 (!) 146/70 (!) 124/42 (!) 128/49  Pulse: 76 74 62 64  Resp: 18 18 16    Temp: 97.7 F (36.5 C) 97.7 F (36.5 C) 98.5 F (36.9 C) 98.4 F (36.9 C)  TempSrc: Oral Oral Oral Oral  SpO2: 97% 95% 100% 97%  Weight:      Height:        Intake/Output Summary (Last 24 hours) at 02/11/2022 1044 Last data filed at 02/11/2022 0900 Gross per 24 hour  Intake 2261.11 ml  Output 1750 ml  Net 511.11 ml    Filed Weights   02/05/22 2100 02/06/22 0950 02/08/22 1309  Weight: 85.5 kg 85.5 kg 85.5 kg    Examination:  General appearance: Awake alert.  In no distress Resp: Clear to auscultation bilaterally.  Normal effort Cardio: S1-S2 is normal regular.  No S3-S4.  No rubs murmurs or bruit GI: Abdomen is soft.  Nontender nondistended.  Bowel sounds are present normal.  No masses organomegaly     Data Reviewed: I have personally reviewed following labs and imaging studies  CBC: Recent Labs  Lab 02/05/22 1149 02/06/22 0427 02/07/22 0705 02/08/22 0646 02/09/22 0550 02/10/22 0535  WBC 11.9* 11.3* 9.5 7.0 8.8 8.0  NEUTROABS 9.8*  --   --   --   --   --   HGB 9.6* 8.2* 7.7* 8.4* 7.4* 7.5*  HCT 30.3* 26.0* 25.2* 27.7* 25.2* 25.2*  MCV 85.4 87.0 90.0 89.4 92.0 90.6  PLT 429* 368 330 338 289 292    Basic Metabolic Panel: Recent Labs  Lab 02/06/22 0427 02/07/22 0705 02/08/22 0646 02/09/22 0550 02/10/22 0535  NA 135 139 139 143 142  K 4.8 5.1 4.6 4.7 4.6  CL 104 108 109 118* 116*  CO2 19* 19* 21* 18* 20*  GLUCOSE 271* 308* 179* 210* 174*  BUN 87* 98* 79* 69* 61*  CREATININE 2.19* 2.01* 1.63* 1.52* 1.40*  CALCIUM 9.1 9.0 8.6* 8.3*  8.3*  MG  --  2.6*  --   --   --     GFR: Estimated Creatinine Clearance: 46.1 mL/min (A) (by C-G formula based on SCr of 1.4 mg/dL (H)). Liver Function Tests: Recent Labs  Lab 02/05/22 1149 02/06/22 0427  AST 28 22  ALT 39 31  ALKPHOS 137* 131*  BILITOT 0.7 0.9  PROT 7.3 6.4*  ALBUMIN 2.6* 2.2*     CBG: Recent Labs  Lab 02/10/22 0754 02/10/22 1223 02/10/22 1655 02/10/22 2153 02/11/22 0723  GLUCAP 134* 103* 127* 113* 103*      Sepsis Labs: Recent Labs  Lab 02/05/22 1315  LATICACIDVEN 1.4     Recent Results (from the past 240 hour(s))  MRSA Next Gen by PCR, Nasal     Status: None   Collection Time: 02/06/22  6:40 AM   Specimen: Nasal Mucosa; Nasal Swab  Result Value Ref Range Status   MRSA by PCR Next Gen NOT DETECTED NOT DETECTED Final  Comment: (NOTE) The GeneXpert MRSA Assay (FDA approved for NASAL specimens only), is one component of a comprehensive MRSA colonization surveillance program. It is not intended to diagnose MRSA infection nor to guide or monitor treatment for MRSA infections. Test performance is not FDA approved in patients less than 27 years old. Performed at [facility], [address] [place], [place] [address]   Aerobic/Anaerobic Culture w Gram Stain (surgical/deep wound)     Status: None (Preliminary result)   Collection Time: 02/06/22 11:33 AM   Specimen: PATH Other; Tissue  Result Value Ref Range Status   Specimen Description   Final    ABSCESS GLUTEAL ABSCESS Performed at [facility], [address] [place], [place] [address]    Special Requests   Final    NONE Performed at [facility], [address] [place], [place] [address]    Gram Stain   Final    FEW WBC PRESENT, PREDOMINANTLY PMN ABUNDANT GRAM POSITIVE RODS    Culture   Final    FEW PROTEUS MIRABILIS FEW STAPHYLOCOCCUS AUREUS SUSCEPTIBILITIES TO FOLLOW Performed at [facility], [address], [place], [place] [address]    Report Status PENDING  Incomplete      Radiology Studies: No results found.    Scheduled Meds:  acetaminophen  1,000 mg Oral Q6H   carvedilol  3.125 mg Oral BID WC   Chlorhexidine Gluconate Cloth  6 each Topical Daily   collagenase   Topical BID   enoxaparin (LOVENOX) injection  40 mg Subcutaneous Q24H   FLUoxetine  10 mg Oral q morning   insulin aspart  0-15 Units Subcutaneous TID WC   insulin glargine-yfgn  10 Units Subcutaneous QHS   insulin glargine-yfgn  15 Units Subcutaneous Daily   levothyroxine  50 mcg Oral Q0600   metroNIDAZOLE  500 mg Oral Q12H   pantoprazole  40 mg Oral Daily   rosuvastatin  40 mg Oral Daily   saccharomyces boulardii  250 mg Oral BID   Continuous Infusions:  sodium chloride 50 mL/hr at 02/11/22 0600   cefTRIAXone (ROCEPHIN)  IV Stopped (02/10/22 1738)   vancomycin Stopped (02/10/22 1915)     LOS: 6 days    Osvaldo Shipper, MD [facility]   If 7PM-7AM, please contact night-coverage [email or web address]  02/11/2022, 10:44 AM

## 2022-02-12 DIAGNOSIS — Z794 Long term (current) use of insulin: Secondary | ICD-10-CM | POA: Diagnosis not present

## 2022-02-12 DIAGNOSIS — L03319 Cellulitis of trunk, unspecified: Secondary | ICD-10-CM | POA: Diagnosis not present

## 2022-02-12 DIAGNOSIS — E1165 Type 2 diabetes mellitus with hyperglycemia: Secondary | ICD-10-CM | POA: Diagnosis not present

## 2022-02-12 DIAGNOSIS — I10 Essential (primary) hypertension: Secondary | ICD-10-CM | POA: Diagnosis not present

## 2022-02-12 LAB — BASIC METABOLIC PANEL
Anion gap: 7 (ref 5–15)
BUN: 29 mg/dL — ABNORMAL HIGH (ref 6–20)
CO2: 20 mmol/L — ABNORMAL LOW (ref 22–32)
Calcium: 8.3 mg/dL — ABNORMAL LOW (ref 8.9–10.3)
Chloride: 116 mmol/L — ABNORMAL HIGH (ref 98–111)
Creatinine, Ser: 1.45 mg/dL — ABNORMAL HIGH (ref 0.44–1.00)
GFR, Estimated: 41 mL/min — ABNORMAL LOW (ref >60)
Glucose, Bld: 176 mg/dL — ABNORMAL HIGH (ref 70–99)
Potassium: 4.4 mmol/L (ref 3.5–5.1)
Sodium: 143 mmol/L (ref 135–145)

## 2022-02-12 LAB — CBC
HCT: 26.2 % — ABNORMAL LOW (ref 36.0–46.0)
Hemoglobin: 8 g/dL — ABNORMAL LOW (ref 12.0–15.0)
MCH: 27.3 pg (ref 26.0–34.0)
MCHC: 30.5 g/dL (ref 30.0–36.0)
MCV: 89.4 fL (ref 80.0–100.0)
Platelets: 260 10*3/uL (ref 150–400)
RBC: 2.93 MIL/uL — ABNORMAL LOW (ref 3.87–5.11)
RDW: 14 % (ref 11.5–15.5)
WBC: 7.9 10*3/uL (ref 4.0–10.5)
nRBC: 0.3 % — ABNORMAL HIGH (ref 0.0–0.2)

## 2022-02-12 LAB — AEROBIC/ANAEROBIC CULTURE W GRAM STAIN (SURGICAL/DEEP WOUND)

## 2022-02-12 LAB — GLUCOSE, CAPILLARY
Glucose-Capillary: 156 mg/dL — ABNORMAL HIGH (ref 70–99)
Glucose-Capillary: 119 mg/dL — ABNORMAL HIGH (ref 70–99)
Glucose-Capillary: 143 mg/dL — ABNORMAL HIGH (ref 70–99)
Glucose-Capillary: 164 mg/dL — ABNORMAL HIGH (ref 70–99)

## 2022-02-12 LAB — MAGNESIUM: Magnesium: 1.9 mg/dL (ref 1.7–2.4)

## 2022-02-12 MED ORDER — CEFAZOLIN SODIUM-DEXTROSE 2-4 GM/100ML-% IV SOLN
2.0000 g | Freq: Three times a day (TID) | INTRAVENOUS | Status: DC
  Administered 2022-02-12 – 2022-02-15 (×9): 2 g via INTRAVENOUS
  Filled 2022-02-12 (×10): qty 100

## 2022-02-12 MED ORDER — NICOTINE 14 MG/24HR TD PT24
14.0000 mg | MEDICATED_PATCH | Freq: Every day | TRANSDERMAL | Status: DC
  Administered 2022-02-12 – 2022-02-13 (×2): 14 mg via TRANSDERMAL
  Filled 2022-02-12 (×4): qty 1

## 2022-02-13 DIAGNOSIS — E1165 Type 2 diabetes mellitus with hyperglycemia: Secondary | ICD-10-CM | POA: Diagnosis not present

## 2022-02-13 DIAGNOSIS — I10 Essential (primary) hypertension: Secondary | ICD-10-CM | POA: Diagnosis not present

## 2022-02-13 DIAGNOSIS — L03319 Cellulitis of trunk, unspecified: Secondary | ICD-10-CM | POA: Diagnosis not present

## 2022-02-13 DIAGNOSIS — Z794 Long term (current) use of insulin: Secondary | ICD-10-CM | POA: Diagnosis not present

## 2022-02-13 LAB — GLUCOSE, CAPILLARY
Glucose-Capillary: 157 mg/dL — ABNORMAL HIGH (ref 70–99)
Glucose-Capillary: 122 mg/dL — ABNORMAL HIGH (ref 70–99)
Glucose-Capillary: 139 mg/dL — ABNORMAL HIGH (ref 70–99)
Glucose-Capillary: 117 mg/dL — ABNORMAL HIGH (ref 70–99)

## 2022-02-13 LAB — CREATININE, SERUM
Creatinine, Ser: 1.24 mg/dL — ABNORMAL HIGH (ref 0.44–1.00)
GFR, Estimated: 50 mL/min — ABNORMAL LOW (ref >60)

## 2022-02-13 MED ORDER — COLLAGENASE 250 UNIT/GM EX OINT: TOPICAL_OINTMENT | Freq: Two times a day (BID) | CUTANEOUS | 0 refills | Status: DC

## 2022-02-13 MED ORDER — ENSURE ENLIVE PO LIQD
237.0000 mL | Freq: Three times a day (TID) | ORAL | Status: DC
  Administered 2022-02-13 – 2022-02-14 (×3): 237 mL via ORAL

## 2022-02-13 MED ORDER — MEDIHONEY WOUND/BURN DRESSING EX PSTE: 1.0000 | PASTE | Freq: Every day | CUTANEOUS | Status: DC

## 2022-02-13 MED ORDER — INSULIN DEGLUDEC 100 UNIT/ML ~~LOC~~ SOPN: 20.0000 [IU] | PEN_INJECTOR | Freq: Every day | SUBCUTANEOUS | Status: DC

## 2022-02-13 MED ORDER — JUVEN PO PACK
1.0000 | PACK | Freq: Two times a day (BID) | ORAL | Status: DC
  Administered 2022-02-13 – 2022-02-14 (×3): 1 via ORAL
  Filled 2022-02-13 (×3): qty 1

## 2022-02-13 MED ORDER — ASCORBIC ACID 500 MG PO TABS
500.0000 mg | ORAL_TABLET | Freq: Two times a day (BID) | ORAL | Status: DC
  Administered 2022-02-13 – 2022-02-15 (×4): 500 mg via ORAL
  Filled 2022-02-13 (×4): qty 1

## 2022-02-13 MED ORDER — OXYCODONE HCL 5 MG PO TABS: 5.0000 mg | ORAL_TABLET | ORAL | 0 refills | Status: DC | PRN

## 2022-02-13 MED ORDER — ZINC SULFATE 220 (50 ZN) MG PO CAPS
220.0000 mg | ORAL_CAPSULE | Freq: Every day | ORAL | Status: DC
Start: 2022-02-13 — End: 2022-02-15
  Administered 2022-02-13 – 2022-02-15 (×3): 220 mg via ORAL
  Filled 2022-02-13 (×3): qty 1

## 2022-02-13 MED ORDER — CEFADROXIL 500 MG PO CAPS: 500.0000 mg | ORAL_CAPSULE | Freq: Two times a day (BID) | ORAL | 0 refills | Status: AC

## 2022-02-13 MED ORDER — MEDIHONEY WOUND/BURN DRESSING EX PSTE
1.0000 | PASTE | Freq: Every day | CUTANEOUS | Status: DC
  Administered 2022-02-13 – 2022-02-15 (×3): 1 via TOPICAL
  Filled 2022-02-13: qty 44

## 2022-02-13 MED ORDER — ADULT MULTIVITAMIN W/MINERALS CH
1.0000 | ORAL_TABLET | Freq: Every day | ORAL | Status: DC
  Administered 2022-02-13 – 2022-02-15 (×3): 1 via ORAL
  Filled 2022-02-13 (×3): qty 1

## 2022-02-13 MED ORDER — METRONIDAZOLE 500 MG PO TABS: 500.0000 mg | ORAL_TABLET | Freq: Two times a day (BID) | ORAL | 0 refills | Status: AC

## 2022-02-13 MED ORDER — ACETAMINOPHEN 500 MG PO TABS: 1000.0000 mg | ORAL_TABLET | Freq: Four times a day (QID) | ORAL | 0 refills | Status: DC | PRN

## 2022-02-13 MED ORDER — SACCHAROMYCES BOULARDII 250 MG PO CAPS: 250.0000 mg | ORAL_CAPSULE | Freq: Two times a day (BID) | ORAL | 0 refills | Status: AC

## 2022-02-13 NOTE — TOC Progression Note (Signed)
Transition of Care [facility]) - Progression Note    Patient Details  Name: [name] MRN: [id number] Date of Birth: Apr 28, 1961  [facility]) CM/SW Contact  Otelia Santee, LCSW Phone Number: 02/13/2022, 12:00 PM  Clinical Narrative:    Pt has been accepted to [facility] for SNF placement. Facility is currently working on English as a second language teacher.    Expected Discharge Plan: Home/Self Care Barriers to Discharge: Continued Medical Work up  Expected Discharge Plan and Services Expected Discharge Plan: Home/Self Care In-house Referral: Clinical Social Work Discharge Planning Services: CM Consult Post Acute Care Choice: NA Living arrangements for the past 2 months: Apartment                 DME Arranged: N/A DME Agency: NA                   Social Determinants of Health (SDOH) Interventions    Readmission Risk Interventions    02/07/2022   12:17 PM  Readmission Risk Prevention Plan  Transportation Screening Complete  PCP or Specialist Appt within 5-7 Days Complete  Home Care Screening Complete  Medication Review (RN CM) Complete

## 2022-02-13 NOTE — Care Management Important Message (Signed)
Important Message  Patient Details IM Letter given to the Patient. Name: [name] MRN: [id number] Date of Birth: 05/04/1961   Medicare Important Message Given:  Yes     Caren Macadam 02/13/2022, 11:08 AM

## 2022-02-14 DIAGNOSIS — L03319 Cellulitis of trunk, unspecified: Secondary | ICD-10-CM | POA: Diagnosis not present

## 2022-02-14 LAB — GLUCOSE, CAPILLARY
Glucose-Capillary: 136 mg/dL — ABNORMAL HIGH (ref 70–99)
Glucose-Capillary: 142 mg/dL — ABNORMAL HIGH (ref 70–99)
Glucose-Capillary: 148 mg/dL — ABNORMAL HIGH (ref 70–99)
Glucose-Capillary: 108 mg/dL — ABNORMAL HIGH (ref 70–99)

## 2022-02-14 MED ORDER — JUVEN PO PACK: 1.0000 | PACK | Freq: Two times a day (BID) | ORAL | 0 refills | Status: DC

## 2022-02-14 MED ORDER — ZINC SULFATE 220 (50 ZN) MG PO CAPS: 220.0000 mg | ORAL_CAPSULE | Freq: Every day | ORAL | Status: DC

## 2022-02-14 MED ORDER — ASCORBIC ACID 500 MG PO TABS: 500.0000 mg | ORAL_TABLET | Freq: Two times a day (BID) | ORAL | Status: DC

## 2022-02-14 MED ORDER — ENSURE ENLIVE PO LIQD: 237.0000 mL | Freq: Three times a day (TID) | ORAL | 12 refills | Status: DC

## 2022-02-14 MED ORDER — ADULT MULTIVITAMIN W/MINERALS CH: 1.0000 | ORAL_TABLET | Freq: Every day | ORAL | Status: DC

## 2022-02-14 NOTE — TOC Progression Note (Signed)
Transition of Care [facility]) - Progression Note    Patient Details  Name: [name] MRN: [id number] Date of Birth: 1960/10/27  Transition of Care [facility]) CM/SW Contact  Otelia Santee, LCSW Phone Number: 02/14/2022, 9:47 AM  Clinical Narrative:    Still awaiting insurance authorization for SNF placement. CSW met with pt to inform her of current status.    Expected Discharge Plan: Home/Self Care Barriers to Discharge: Continued Medical Work up  Expected Discharge Plan and Services Expected Discharge Plan: Home/Self Care In-house Referral: Clinical Social Work Discharge Planning Services: CM Consult Post Acute Care Choice: NA Living arrangements for the past 2 months: Apartment Expected Discharge Date: 02/14/22               DME Arranged: N/A DME Agency: NA                   Social Determinants of Health (SDOH) Interventions    Readmission Risk Interventions    02/07/2022   12:17 PM  Readmission Risk Prevention Plan  Transportation Screening Complete  PCP or Specialist Appt within 5-7 Days Complete  Home Care Screening Complete  Medication Review (RN CM) Complete

## 2022-02-15 LAB — GLUCOSE, CAPILLARY
Glucose-Capillary: 121 mg/dL — ABNORMAL HIGH (ref 70–99)
Glucose-Capillary: 77 mg/dL (ref 70–99)

## 2022-02-15 NOTE — Progress Notes (Signed)
Patient seen and examined No new complaints Dc summary completed 6/28 and reviewed, no update needed Discharge date 02/15/2022 today

## 2022-02-15 NOTE — Progress Notes (Signed)
Report called to [facility]  

## 2022-02-15 NOTE — TOC Transition Note (Signed)
Transition of Care [facility]) - CM/SW Discharge Note   Patient Details  Name: [name] MRN: [id number] Date of Birth: [birth year]  Transition of Care [facility]) CM/SW Contact:  Otelia Santee, LCSW Phone Number: 02/15/2022, 9:09 AM   Clinical Narrative:    Pt is to transfer to [facility] room 119A. CSW spoke with pt's brother and updated him of transfer. Nurses to call report to [phone]. [facility] is to transport pt to facility.    Final next level of care: Skilled Nursing Facility Barriers to Discharge: Barriers Resolved   Patient Goals and CMS Choice Patient states their goals for this hospitalization and ongoing recovery are:: To return home   Choice offered to / list presented to : Patient  Discharge Placement   Existing PASRR number confirmed : 02/09/22          Patient chooses bed at: Other - please specify in the comment section below: [facility]) Patient to be transferred to facility by: [facility] Name of family member notified: Brother, Jess Sulak Patient and family notified of of transfer: 02/15/22  Discharge Plan and Services In-house Referral: Clinical Social Work Discharge Planning Services: CM Consult Post Acute Care Choice: NA          DME Arranged: N/A DME Agency: NA                  Social Determinants of Health (SDOH) Interventions     Readmission Risk Interventions    02/15/2022    9:01 AM 02/07/2022   12:17 PM  Readmission Risk Prevention Plan  Transportation Screening Complete Complete  PCP or Specialist Appt within 5-7 Days  Complete  PCP or Specialist Appt within 3-5 Days Complete   Home Care Screening  Complete  Medication Review (RN CM)  Complete  HRI or Home Care Consult Complete   Social Work Consult for Recovery Care Planning/Counseling Complete   Palliative Care Screening Not Applicable   Medication Review [employer]) Complete

## 2022-02-28 ENCOUNTER — Telehealth: Payer: Self-pay

## 2022-02-28 NOTE — Telephone Encounter (Signed)
Biotronik monitor is not connected within 21 days. Attempted to contact patient to inquire. No answer, unable to leave VM.

## 2022-03-06 ENCOUNTER — Telehealth: Payer: Self-pay

## 2022-03-06 NOTE — Telephone Encounter (Signed)
Called patient and no answer/voicemail full.  Called patient brother [name](dpr) LMOVM for him to have the patient to give [place] a call back.

## 2022-03-06 NOTE — Telephone Encounter (Signed)
See 03/06/2022 phone note.

## 2022-03-15 NOTE — Telephone Encounter (Signed)
Called patient about monitor not connecting in 37 days. I sent patient a letter 03/15/2022.

## 2022-03-22 ENCOUNTER — Inpatient Hospital Stay (HOSPITAL_COMMUNITY): Payer: Medicare HMO

## 2022-03-22 ENCOUNTER — Other Ambulatory Visit: Payer: Self-pay

## 2022-03-22 ENCOUNTER — Emergency Department (HOSPITAL_COMMUNITY): Payer: Medicare HMO

## 2022-03-22 ENCOUNTER — Inpatient Hospital Stay (HOSPITAL_COMMUNITY)
Admission: EM | Admit: 2022-03-22 | Discharge: 2022-04-09 | DRG: 853 | Disposition: A | Discharge status: Hospice | Payer: Medicare HMO | Source: Skilled Nursing Facility | Attending: Internal Medicine | Admitting: Internal Medicine

## 2022-03-22 ENCOUNTER — Encounter (HOSPITAL_COMMUNITY): Payer: Self-pay | Admitting: Emergency Medicine

## 2022-03-22 DIAGNOSIS — E1152 Type 2 diabetes mellitus with diabetic peripheral angiopathy with gangrene: Secondary | ICD-10-CM | POA: Diagnosis present

## 2022-03-22 DIAGNOSIS — G4733 Obstructive sleep apnea (adult) (pediatric): Secondary | ICD-10-CM | POA: Diagnosis present

## 2022-03-22 DIAGNOSIS — N179 Acute kidney failure, unspecified: Secondary | ICD-10-CM | POA: Diagnosis present

## 2022-03-22 DIAGNOSIS — L8990 Pressure ulcer of unspecified site, unspecified stage: Secondary | ICD-10-CM | POA: Insufficient documentation

## 2022-03-22 DIAGNOSIS — S31000A Unspecified open wound of lower back and pelvis without penetration into retroperitoneum, initial encounter: Secondary | ICD-10-CM

## 2022-03-22 DIAGNOSIS — Z7984 Long term (current) use of oral hypoglycemic drugs: Secondary | ICD-10-CM

## 2022-03-22 DIAGNOSIS — B370 Candidal stomatitis: Secondary | ICD-10-CM | POA: Diagnosis not present

## 2022-03-22 DIAGNOSIS — D638 Anemia in other chronic diseases classified elsewhere: Secondary | ICD-10-CM | POA: Diagnosis present

## 2022-03-22 DIAGNOSIS — Z8614 Personal history of Methicillin resistant Staphylococcus aureus infection: Secondary | ICD-10-CM

## 2022-03-22 DIAGNOSIS — D500 Iron deficiency anemia secondary to blood loss (chronic): Secondary | ICD-10-CM | POA: Diagnosis not present

## 2022-03-22 DIAGNOSIS — F32A Depression, unspecified: Secondary | ICD-10-CM | POA: Diagnosis present

## 2022-03-22 DIAGNOSIS — S31000D Unspecified open wound of lower back and pelvis without penetration into retroperitoneum, subsequent encounter: Secondary | ICD-10-CM | POA: Diagnosis not present

## 2022-03-22 DIAGNOSIS — F05 Delirium due to known physiological condition: Secondary | ICD-10-CM | POA: Diagnosis not present

## 2022-03-22 DIAGNOSIS — E1165 Type 2 diabetes mellitus with hyperglycemia: Secondary | ICD-10-CM

## 2022-03-22 DIAGNOSIS — E46 Unspecified protein-calorie malnutrition: Secondary | ICD-10-CM | POA: Diagnosis not present

## 2022-03-22 DIAGNOSIS — E1143 Type 2 diabetes mellitus with diabetic autonomic (poly)neuropathy: Secondary | ICD-10-CM | POA: Diagnosis present

## 2022-03-22 DIAGNOSIS — I130 Hypertensive heart and chronic kidney disease with heart failure and stage 1 through stage 4 chronic kidney disease, or unspecified chronic kidney disease: Secondary | ICD-10-CM | POA: Diagnosis present

## 2022-03-22 DIAGNOSIS — Z6837 Body mass index (BMI) 37.0-37.9, adult: Secondary | ICD-10-CM

## 2022-03-22 DIAGNOSIS — E1151 Type 2 diabetes mellitus with diabetic peripheral angiopathy without gangrene: Secondary | ICD-10-CM | POA: Diagnosis present

## 2022-03-22 DIAGNOSIS — K3184 Gastroparesis: Secondary | ICD-10-CM | POA: Diagnosis present

## 2022-03-22 DIAGNOSIS — E876 Hypokalemia: Secondary | ICD-10-CM | POA: Diagnosis not present

## 2022-03-22 DIAGNOSIS — Z515 Encounter for palliative care: Secondary | ICD-10-CM | POA: Diagnosis not present

## 2022-03-22 DIAGNOSIS — I5022 Chronic systolic (congestive) heart failure: Secondary | ICD-10-CM | POA: Diagnosis not present

## 2022-03-22 DIAGNOSIS — Z89511 Acquired absence of right leg below knee: Secondary | ICD-10-CM

## 2022-03-22 DIAGNOSIS — E872 Acidosis, unspecified: Secondary | ICD-10-CM | POA: Diagnosis present

## 2022-03-22 DIAGNOSIS — Z880 Allergy status to penicillin: Secondary | ICD-10-CM

## 2022-03-22 DIAGNOSIS — F17210 Nicotine dependence, cigarettes, uncomplicated: Secondary | ICD-10-CM | POA: Diagnosis not present

## 2022-03-22 DIAGNOSIS — M86611 Other chronic osteomyelitis, right shoulder: Secondary | ICD-10-CM | POA: Diagnosis not present

## 2022-03-22 DIAGNOSIS — Z89611 Acquired absence of right leg above knee: Secondary | ICD-10-CM | POA: Diagnosis not present

## 2022-03-22 DIAGNOSIS — Z794 Long term (current) use of insulin: Secondary | ICD-10-CM | POA: Diagnosis not present

## 2022-03-22 DIAGNOSIS — I959 Hypotension, unspecified: Secondary | ICD-10-CM

## 2022-03-22 DIAGNOSIS — Z79890 Hormone replacement therapy (postmenopausal): Secondary | ICD-10-CM

## 2022-03-22 DIAGNOSIS — Z7189 Other specified counseling: Secondary | ICD-10-CM | POA: Diagnosis not present

## 2022-03-22 DIAGNOSIS — E039 Hypothyroidism, unspecified: Secondary | ICD-10-CM | POA: Diagnosis present

## 2022-03-22 DIAGNOSIS — E43 Unspecified severe protein-calorie malnutrition: Secondary | ICD-10-CM

## 2022-03-22 DIAGNOSIS — T8789 Other complications of amputation stump: Secondary | ICD-10-CM | POA: Diagnosis not present

## 2022-03-22 DIAGNOSIS — J449 Chronic obstructive pulmonary disease, unspecified: Secondary | ICD-10-CM | POA: Diagnosis present

## 2022-03-22 DIAGNOSIS — G9340 Encephalopathy, unspecified: Secondary | ICD-10-CM | POA: Diagnosis not present

## 2022-03-22 DIAGNOSIS — Y835 Amputation of limb(s) as the cause of abnormal reaction of the patient, or of later complication, without mention of misadventure at the time of the procedure: Secondary | ICD-10-CM | POA: Diagnosis present

## 2022-03-22 DIAGNOSIS — T8781 Dehiscence of amputation stump: Secondary | ICD-10-CM

## 2022-03-22 DIAGNOSIS — M869 Osteomyelitis, unspecified: Secondary | ICD-10-CM | POA: Diagnosis present

## 2022-03-22 DIAGNOSIS — G8929 Other chronic pain: Secondary | ICD-10-CM | POA: Diagnosis present

## 2022-03-22 DIAGNOSIS — E1141 Type 2 diabetes mellitus with diabetic mononeuropathy: Secondary | ICD-10-CM | POA: Diagnosis present

## 2022-03-22 DIAGNOSIS — Z87820 Personal history of traumatic brain injury: Secondary | ICD-10-CM

## 2022-03-22 DIAGNOSIS — L89159 Pressure ulcer of sacral region, unspecified stage: Secondary | ICD-10-CM | POA: Diagnosis not present

## 2022-03-22 DIAGNOSIS — G9341 Metabolic encephalopathy: Secondary | ICD-10-CM | POA: Diagnosis not present

## 2022-03-22 DIAGNOSIS — Z888 Allergy status to other drugs, medicaments and biological substances status: Secondary | ICD-10-CM

## 2022-03-22 DIAGNOSIS — Z79899 Other long term (current) drug therapy: Secondary | ICD-10-CM

## 2022-03-22 DIAGNOSIS — Z83438 Family history of other disorder of lipoprotein metabolism and other lipidemia: Secondary | ICD-10-CM

## 2022-03-22 DIAGNOSIS — F419 Anxiety disorder, unspecified: Secondary | ICD-10-CM | POA: Diagnosis present

## 2022-03-22 DIAGNOSIS — E875 Hyperkalemia: Secondary | ICD-10-CM | POA: Diagnosis present

## 2022-03-22 DIAGNOSIS — Z833 Family history of diabetes mellitus: Secondary | ICD-10-CM

## 2022-03-22 DIAGNOSIS — E1122 Type 2 diabetes mellitus with diabetic chronic kidney disease: Secondary | ICD-10-CM | POA: Diagnosis present

## 2022-03-22 DIAGNOSIS — Z8249 Family history of ischemic heart disease and other diseases of the circulatory system: Secondary | ICD-10-CM

## 2022-03-22 DIAGNOSIS — N1832 Chronic kidney disease, stage 3b: Secondary | ICD-10-CM | POA: Diagnosis present

## 2022-03-22 DIAGNOSIS — I5032 Chronic diastolic (congestive) heart failure: Secondary | ICD-10-CM | POA: Diagnosis present

## 2022-03-22 DIAGNOSIS — R7989 Other specified abnormal findings of blood chemistry: Secondary | ICD-10-CM | POA: Diagnosis not present

## 2022-03-22 DIAGNOSIS — M868X6 Other osteomyelitis, lower leg: Secondary | ICD-10-CM | POA: Diagnosis not present

## 2022-03-22 DIAGNOSIS — E7800 Pure hypercholesterolemia, unspecified: Secondary | ICD-10-CM | POA: Diagnosis present

## 2022-03-22 DIAGNOSIS — E1169 Type 2 diabetes mellitus with other specified complication: Secondary | ICD-10-CM | POA: Diagnosis present

## 2022-03-22 DIAGNOSIS — Z66 Do not resuscitate: Secondary | ICD-10-CM | POA: Diagnosis not present

## 2022-03-22 DIAGNOSIS — B3781 Candidal esophagitis: Secondary | ICD-10-CM | POA: Diagnosis not present

## 2022-03-22 DIAGNOSIS — E669 Obesity, unspecified: Secondary | ICD-10-CM | POA: Diagnosis present

## 2022-03-22 DIAGNOSIS — E162 Hypoglycemia, unspecified: Secondary | ICD-10-CM | POA: Diagnosis not present

## 2022-03-22 DIAGNOSIS — E860 Dehydration: Secondary | ICD-10-CM | POA: Diagnosis present

## 2022-03-22 DIAGNOSIS — M86161 Other acute osteomyelitis, right tibia and fibula: Secondary | ICD-10-CM | POA: Diagnosis not present

## 2022-03-22 DIAGNOSIS — Z89512 Acquired absence of left leg below knee: Secondary | ICD-10-CM

## 2022-03-22 DIAGNOSIS — Z931 Gastrostomy status: Secondary | ICD-10-CM | POA: Diagnosis not present

## 2022-03-22 DIAGNOSIS — A419 Sepsis, unspecified organism: Principal | ICD-10-CM | POA: Diagnosis present

## 2022-03-22 DIAGNOSIS — Z960 Presence of urogenital implants: Secondary | ICD-10-CM | POA: Diagnosis not present

## 2022-03-22 DIAGNOSIS — K219 Gastro-esophageal reflux disease without esophagitis: Secondary | ICD-10-CM | POA: Diagnosis present

## 2022-03-22 DIAGNOSIS — E11649 Type 2 diabetes mellitus with hypoglycemia without coma: Secondary | ICD-10-CM | POA: Diagnosis not present

## 2022-03-22 DIAGNOSIS — I96 Gangrene, not elsewhere classified: Secondary | ICD-10-CM | POA: Diagnosis present

## 2022-03-22 DIAGNOSIS — Z91041 Radiographic dye allergy status: Secondary | ICD-10-CM

## 2022-03-22 DIAGNOSIS — K5641 Fecal impaction: Secondary | ICD-10-CM | POA: Diagnosis present

## 2022-03-22 DIAGNOSIS — Z8673 Personal history of transient ischemic attack (TIA), and cerebral infarction without residual deficits: Secondary | ICD-10-CM

## 2022-03-22 DIAGNOSIS — T83511A Infection and inflammatory reaction due to indwelling urethral catheter, initial encounter: Secondary | ICD-10-CM | POA: Diagnosis not present

## 2022-03-22 DIAGNOSIS — Z7902 Long term (current) use of antithrombotics/antiplatelets: Secondary | ICD-10-CM

## 2022-03-22 DIAGNOSIS — Z950 Presence of cardiac pacemaker: Secondary | ICD-10-CM

## 2022-03-22 LAB — PROTIME-INR
INR: 1.3 — ABNORMAL HIGH (ref 0.8–1.2)
Prothrombin Time: 15.7 seconds — ABNORMAL HIGH (ref 11.4–15.2)

## 2022-03-22 LAB — COMPREHENSIVE METABOLIC PANEL
ALT: 16 U/L (ref 0–44)
AST: 22 U/L (ref 15–41)
Albumin: 2.4 g/dL — ABNORMAL LOW (ref 3.5–5.0)
Alkaline Phosphatase: 126 U/L (ref 38–126)
Anion gap: 16 — ABNORMAL HIGH (ref 5–15)
BUN: 80 mg/dL — ABNORMAL HIGH (ref 6–20)
CO2: 19 mmol/L — ABNORMAL LOW (ref 22–32)
Calcium: 11.6 mg/dL — ABNORMAL HIGH (ref 8.9–10.3)
Chloride: 103 mmol/L (ref 98–111)
Creatinine, Ser: 4.22 mg/dL — ABNORMAL HIGH (ref 0.44–1.00)
GFR, Estimated: 11 mL/min — ABNORMAL LOW (ref >60)
Glucose, Bld: 160 mg/dL — ABNORMAL HIGH (ref 70–99)
Potassium: 5.2 mmol/L — ABNORMAL HIGH (ref 3.5–5.1)
Sodium: 138 mmol/L (ref 135–145)
Total Bilirubin: 0.8 mg/dL (ref 0.3–1.2)
Total Protein: 7.8 g/dL (ref 6.5–8.1)

## 2022-03-22 LAB — CBC WITH DIFFERENTIAL/PLATELET
Abs Immature Granulocytes: 0.04 10*3/uL (ref 0.00–0.07)
Basophils Absolute: 0 10*3/uL (ref 0.0–0.1)
Basophils Relative: 0 %
Eosinophils Absolute: 0.1 10*3/uL (ref 0.0–0.5)
Eosinophils Relative: 1 %
HCT: 30.1 % — ABNORMAL LOW (ref 36.0–46.0)
Hemoglobin: 9.1 g/dL — ABNORMAL LOW (ref 12.0–15.0)
Immature Granulocytes: 0 %
Lymphocytes Relative: 45 %
Lymphs Abs: 5 10*3/uL — ABNORMAL HIGH (ref 0.7–4.0)
MCH: 26.8 pg (ref 26.0–34.0)
MCHC: 30.2 g/dL (ref 30.0–36.0)
MCV: 88.8 fL (ref 80.0–100.0)
Monocytes Absolute: 0.8 10*3/uL (ref 0.1–1.0)
Monocytes Relative: 7 %
Neutro Abs: 5.2 10*3/uL (ref 1.7–7.7)
Neutrophils Relative %: 47 %
Platelets: 398 10*3/uL (ref 150–400)
RBC: 3.39 MIL/uL — ABNORMAL LOW (ref 3.87–5.11)
RDW: 16.5 % — ABNORMAL HIGH (ref 11.5–15.5)
WBC: 11.2 10*3/uL — ABNORMAL HIGH (ref 4.0–10.5)
nRBC: 0 % (ref 0.0–0.2)

## 2022-03-22 LAB — MAGNESIUM: Magnesium: 2.2 mg/dL (ref 1.7–2.4)

## 2022-03-22 LAB — URINALYSIS, ROUTINE W REFLEX MICROSCOPIC
Bilirubin Urine: NEGATIVE
Glucose, UA: NEGATIVE mg/dL
Hgb urine dipstick: NEGATIVE
Ketones, ur: NEGATIVE mg/dL
Nitrite: NEGATIVE
Protein, ur: 100 mg/dL — AB
Specific Gravity, Urine: 1.015 (ref 1.005–1.030)
WBC, UA: >50 WBC/hpf — ABNORMAL HIGH (ref 0–5)
pH: 5 (ref 5.0–8.0)

## 2022-03-22 LAB — LACTIC ACID, PLASMA
Lactic Acid, Venous: 1.6 mmol/L (ref 0.5–1.9)
Lactic Acid, Venous: 1 mmol/L (ref 0.5–1.9)

## 2022-03-22 LAB — TROPONIN I (HIGH SENSITIVITY)
Troponin I (High Sensitivity): 15 ng/L (ref <18)
Troponin I (High Sensitivity): 12 ng/L (ref <18)

## 2022-03-22 MED ORDER — SODIUM CHLORIDE 0.9 % IV BOLUS (SEPSIS)
500.0000 mL | Freq: Once | INTRAVENOUS | Status: AC
  Administered 2022-03-22: 500 mL via INTRAVENOUS

## 2022-03-22 MED ORDER — ACETAMINOPHEN 500 MG PO TABS
1000.0000 mg | ORAL_TABLET | Freq: Four times a day (QID) | ORAL | Status: DC | PRN
  Administered 2022-03-23 – 2022-04-01 (×2): 1000 mg via ORAL
  Filled 2022-03-22 (×2): qty 2

## 2022-03-22 MED ORDER — METRONIDAZOLE 500 MG/100ML IV SOLN
500.0000 mg | Freq: Once | INTRAVENOUS | Status: AC
  Administered 2022-03-22: 500 mg via INTRAVENOUS
  Filled 2022-03-22: qty 100

## 2022-03-22 MED ORDER — SODIUM CHLORIDE 0.9 % IV BOLUS (SEPSIS)
1000.0000 mL | Freq: Once | INTRAVENOUS | Status: AC
Start: 2022-03-22 — End: 2022-03-22
  Administered 2022-03-22: 1000 mL via INTRAVENOUS

## 2022-03-22 MED ORDER — INSULIN ASPART 100 UNIT/ML IJ SOLN: 0.0000 [IU] | Freq: Three times a day (TID) | INTRAMUSCULAR | Status: DC

## 2022-03-22 MED ORDER — SODIUM CHLORIDE 0.9 % IV BOLUS (SEPSIS)
1000.0000 mL | Freq: Once | INTRAVENOUS | Status: AC
  Administered 2022-03-22: 1000 mL via INTRAVENOUS

## 2022-03-22 MED ORDER — VANCOMYCIN VARIABLE DOSE PER UNSTABLE RENAL FUNCTION (PHARMACIST DOSING): Status: DC

## 2022-03-22 MED ORDER — LACTATED RINGERS IV SOLN: INTRAVENOUS | Status: AC

## 2022-03-22 MED ORDER — VANCOMYCIN HCL 1500 MG/300ML IV SOLN
1500.0000 mg | Freq: Once | INTRAVENOUS | Status: AC
  Administered 2022-03-22: 1500 mg via INTRAVENOUS
  Filled 2022-03-22: qty 300

## 2022-03-22 MED ORDER — CLOPIDOGREL BISULFATE 75 MG PO TABS
75.0000 mg | ORAL_TABLET | Freq: Every morning | ORAL | Status: DC
  Administered 2022-03-23 – 2022-03-28 (×6): 75 mg via ORAL
  Filled 2022-03-22 (×6): qty 1

## 2022-03-22 MED ORDER — FENTANYL CITRATE PF 50 MCG/ML IJ SOSY
50.0000 ug | PREFILLED_SYRINGE | Freq: Once | INTRAMUSCULAR | Status: AC
  Administered 2022-03-22: 50 ug via INTRAVENOUS
  Filled 2022-03-22: qty 1

## 2022-03-22 MED ORDER — SODIUM CHLORIDE 0.9 % IV SOLN
2.0000 g | Freq: Once | INTRAVENOUS | Status: AC
  Administered 2022-03-22: 2 g via INTRAVENOUS
  Filled 2022-03-22: qty 12.5

## 2022-03-22 MED ORDER — ENOXAPARIN SODIUM 30 MG/0.3ML IJ SOSY
30.0000 mg | PREFILLED_SYRINGE | INTRAMUSCULAR | Status: DC
  Administered 2022-03-22 – 2022-03-27 (×6): 30 mg via SUBCUTANEOUS
  Filled 2022-03-22 (×6): qty 0.3

## 2022-03-22 MED ORDER — PANTOPRAZOLE SODIUM 40 MG PO TBEC
40.0000 mg | DELAYED_RELEASE_TABLET | Freq: Every morning | ORAL | Status: DC
  Administered 2022-03-23 – 2022-04-08 (×17): 40 mg via ORAL
  Filled 2022-03-22 (×17): qty 1

## 2022-03-22 MED ORDER — LEVOTHYROXINE SODIUM 50 MCG PO TABS
50.0000 ug | ORAL_TABLET | Freq: Every day | ORAL | Status: DC
  Administered 2022-03-23 – 2022-04-08 (×17): 50 ug via ORAL
  Filled 2022-03-22 (×17): qty 1

## 2022-03-22 MED ORDER — SODIUM CHLORIDE 0.9 % IV SOLN: INTRAVENOUS | Status: DC

## 2022-03-22 MED ORDER — SODIUM CHLORIDE 0.9 % IV SOLN
2.0000 g | INTRAVENOUS | Status: DC
  Administered 2022-03-23 – 2022-03-29 (×6): 2 g via INTRAVENOUS
  Filled 2022-03-22 (×7): qty 12.5

## 2022-03-22 MED ORDER — VANCOMYCIN HCL IN DEXTROSE 1-5 GM/200ML-% IV SOLN: 1000.0000 mg | Freq: Once | INTRAVENOUS | Status: DC

## 2022-03-22 MED ORDER — OXYCODONE HCL 5 MG PO TABS
5.0000 mg | ORAL_TABLET | ORAL | Status: DC | PRN
  Administered 2022-03-23 – 2022-03-28 (×8): 5 mg via ORAL
  Administered 2022-03-29: 10 mg via ORAL
  Administered 2022-03-30 (×3): 5 mg via ORAL
  Filled 2022-03-22 (×3): qty 1
  Filled 2022-03-22: qty 2
  Filled 2022-03-22 (×8): qty 1

## 2022-03-22 NOTE — ED Provider Notes (Signed)
[facility] EMERGENCY DEPARTMENT Provider Note   CSN: [id number] Arrival date & time: 03/22/22  1332     History  Chief Complaint  Patient presents with   Hypotension    [name] is a 61 y.o. female.  HPI Patient presents for hypotension.  Medical history includes COPD, OSA, T2DM, depression, HLD, HTN, anemia, CKD, CHF, TIA, GERD, gastroparesis, bradycardia s/p pacemaker, and anxiety.  She currently resides in a nursing facility.  She has a sacral wound which has been causing her increased pain.  She has bilateral BKA and endorses pain in bilateral lower extremities.  Right stump has a known wound.  EMS noted blood pressure of 80s over 50s prior to arrival.    Home Medications Prior to Admission medications   Medication Sig Start Date End Date Taking? Authorizing Provider  ascorbic acid (VITAMIN C) 500 MG tablet Take 1 tablet (500 mg total) by mouth 2 (two) times daily. 02/14/22  Yes Osvaldo Shipper, MD  clopidogrel (PLAVIX) 75 MG tablet Take 1 tablet (75 mg total) by mouth daily. Patient taking differently: Take 75 mg by mouth every morning. 07/24/16  Yes Ghimire, Werner Lean, MD  Cyanocobalamin (B-12) 1000 MCG TABS Take 1,000 mcg by mouth every morning. 01/22/22  Yes [provider]  FARXIGA 10 MG TABS tablet Take 10 mg by mouth every morning. 01/24/22  Yes [provider]  fenofibrate 160 MG tablet Take 1 tablet (160 mg total) by mouth daily. Patient taking differently: Take 160 mg by mouth at bedtime. 07/24/16  Yes Ghimire, Werner Lean, MD  FLUoxetine (PROZAC) 10 MG capsule Take 10 mg by mouth every morning.   Yes [provider]  Insulin Glargine (BASAGLAR KWIKPEN) 100 UNIT/ML Inject 20 Units into the skin at bedtime.   Yes [provider]  levothyroxine (SYNTHROID, LEVOTHROID) 50 MCG tablet Take 50 mcg by mouth every morning.   Yes [provider]  lisinopril (ZESTRIL) 2.5 MG tablet Take 2.5 mg by mouth every morning.  01/22/22  Yes [provider]  mirtazapine (REMERON) 7.5 MG tablet Take 7.5 mg by mouth at bedtime. 03/15/22  Yes [provider]  Multiple Vitamin (MULTIVITAMIN WITH MINERALS) TABS tablet Take 1 tablet by mouth daily. 02/15/22  Yes Osvaldo Shipper, MD  oxybutynin (DITROPAN-XL) 10 MG 24 hr tablet Take 10 mg by mouth daily. 03/13/22  Yes [provider]  pantoprazole (PROTONIX) 40 MG tablet Take 40 mg by mouth every morning. 09/23/18  Yes [provider]  rosuvastatin (CRESTOR) 40 MG tablet Take 40 mg by mouth every morning.   Yes [provider]  acetaminophen (TYLENOL) 500 MG tablet Take 2 tablets (1,000 mg total) by mouth every 6 (six) hours as needed for moderate pain. 02/13/22   Osvaldo Shipper, MD  carvedilol (COREG) 3.125 MG tablet Take 1 tablet (3.125 mg total) by mouth 2 (two) times daily with a meal. 10/25/15   Rolly Salter, MD  collagenase (SANTYL) 250 UNIT/GM ointment Apply topically 2 (two) times daily. Apply to R buttock area of eschar and cover with WTD dressing BID 02/13/22   Osvaldo Shipper, MD  EASY COMFORT LANCETS MISC test blood sugar THREE TIMES DAILY 09/23/16   [provider]  feeding supplement (ENSURE ENLIVE / ENSURE PLUS) LIQD Take 237 mLs by mouth 3 (three) times daily between meals. 02/14/22   Osvaldo Shipper, MD  glimepiride (AMARYL) 4 MG tablet Take 4 mg by mouth 2 (two) times daily with a meal. 01/22/22  [provider]  insulin degludec (TRESIBA) 100 UNIT/ML FlexTouch Pen Inject 20 Units into the skin daily. 02/13/22   Bonnielee Haff, MD  leptospermum manuka honey (MEDIHONEY) PSTE paste Apply 1 Application topically daily. Apply thin layer (3 mm) to wound. 02/13/22   Bonnielee Haff, MD  metoCLOPramide (REGLAN) 10 MG tablet Take 10 mg by mouth 4 (four) times daily -  before meals and at bedtime. 11/25/15   [provider]  mirabegron ER (MYRBETRIQ) 50 MG TB24 tablet Take 50 mg by mouth daily.    [provider]  nutrition supplement, JUVEN, (JUVEN) PACK Take 1 packet by mouth 2 (two) times daily between meals. 02/14/22   Bonnielee Haff, MD  oxyCODONE (OXY IR/ROXICODONE) 5 MG immediate release tablet Take 1-2 tablets (5-10 mg total) by mouth every 4 (four) hours as needed for severe pain. 02/13/22   Bonnielee Haff, MD  zinc sulfate 220 (50 Zn) MG capsule Take 1 capsule (220 mg total) by mouth daily. 02/15/22   Bonnielee Haff, MD      Allergies    Penicillins, Dulaglutide, Erythromycin, Gadolinium, Iodine-131, Ivp dye [iodinated contrast media], and Metformin    Review of Systems   Review of Systems  Constitutional:  Positive for fatigue.  Musculoskeletal:  Positive for myalgias.  Skin:  Positive for wound.  Neurological:  Positive for weakness (Generalized).  All other systems reviewed and are negative.   Physical Exam Updated Vital Signs BP (!) 124/46   Pulse 66   Temp 97.8 F (36.6 C) (Rectal)   Resp 16   Ht 5\' 5"  (1.651 m)   Wt 85.5 kg   SpO2 100%   BMI 31.37 kg/m  Physical Exam Vitals and nursing note reviewed. Exam conducted with a chaperone present.  Constitutional:      General: She is not in acute distress.    Appearance: She is well-developed. She is ill-appearing. She is not toxic-appearing or diaphoretic.  HENT:     Head: Normocephalic and atraumatic.     Right Ear: External ear normal.     Left Ear: External ear normal.     Nose: Nose normal.     Mouth/Throat:     Mouth: Mucous membranes are moist.  Eyes:     Extraocular Movements: Extraocular movements intact.     Conjunctiva/sclera: Conjunctivae normal.  Cardiovascular:     Rate and Rhythm: Normal rate and regular rhythm.     Heart sounds: No murmur heard. Pulmonary:     Effort: Pulmonary effort is normal. No respiratory distress.     Breath sounds: Normal breath sounds. No wheezing or rales.  Chest:     Chest wall: No tenderness.  Abdominal:     Palpations: Abdomen is soft.     Tenderness:  There is no abdominal tenderness.  Genitourinary:    Comments: Foley catheter in place.  Firm stool balls in rectum. Musculoskeletal:        General: Tenderness present.     Cervical back: Normal range of motion and neck supple.     Comments: Bilateral BKA.  Wound was present on right stump.  There is no surrounding erythema.  Tenderness is present. Large sacral wound with evidence of purulent discharge.  Skin:    General: Skin is warm and dry.     Capillary Refill: Capillary refill takes less than 2 seconds.     Coloration: Skin is pale. Skin is not jaundiced.  Neurological:     General: No focal deficit present.  Mental Status: She is alert and oriented to person, place, and time.  Psychiatric:        Mood and Affect: Mood normal.        Behavior: Behavior normal.      ED Results / Procedures / Treatments   Labs (all labs ordered are listed, but only abnormal results are displayed) Labs Reviewed  COMPREHENSIVE METABOLIC PANEL - Abnormal; Notable for the following components:      Result Value   Potassium 5.2 (*)    CO2 19 (*)    Glucose, Bld 160 (*)    BUN 80 (*)    Creatinine, Ser 4.22 (*)    Calcium 11.6 (*)    Albumin 2.4 (*)    GFR, Estimated 11 (*)    Anion gap 16 (*)    All other components within normal limits  CBC WITH DIFFERENTIAL/PLATELET - Abnormal; Notable for the following components:   WBC 11.2 (*)    RBC 3.39 (*)    Hemoglobin 9.1 (*)    HCT 30.1 (*)    RDW 16.5 (*)    Lymphs Abs 5.0 (*)    All other components within normal limits  PROTIME-INR - Abnormal; Notable for the following components:   Prothrombin Time 15.7 (*)    INR 1.3 (*)    All other components within normal limits  URINALYSIS, ROUTINE W REFLEX MICROSCOPIC - Abnormal; Notable for the following components:   Color, Urine AMBER (*)    APPearance TURBID (*)    Protein, ur 100 (*)    Leukocytes,Ua LARGE (*)    WBC, UA >50 (*)    Bacteria, UA RARE (*)    Non Squamous Epithelial  0-5 (*)    All other components within normal limits  CULTURE, BLOOD (ROUTINE X 2)  CULTURE, BLOOD (ROUTINE X 2)  LACTIC ACID, PLASMA  MAGNESIUM  LACTIC ACID, PLASMA  I-STAT CHEM 8, ED  TROPONIN I (HIGH SENSITIVITY)  TROPONIN I (HIGH SENSITIVITY)    EKG EKG Interpretation  Date/Time:  Wednesday March 22 2022 13:36:19 EDT Ventricular Rate:  73 PR Interval:  168 QRS Duration: 102 QT Interval:  417 QTC Calculation: 460 R Axis:   -58 Text Interpretation: Sinus or ectopic atrial rhythm Left anterior fascicular block Abnormal R-wave progression, late transition LVH with secondary repolarization abnormality Confirmed by Godfrey Pick [phone]) on 03/22/2022 3:54:04 PM  Radiology DG Chest Port 1 View  Result Date: 03/22/2022 CLINICAL DATA:  Sepsis. EXAM: PORTABLE CHEST 1 VIEW COMPARISON:  10/18/2015 chest x-ray and 12/09/2018 through the CT scan. FINDINGS: The cardiac silhouette, mediastinal and hilar contours are within normal limits and stable. The pacer wires are stable. The lungs are clear of an acute process. No pleural effusions or pulmonary lesions. No pneumothorax. The bony thorax is intact. IMPRESSION: No acute cardiopulmonary findings. Electronically Signed   By: Marijo Sanes M.D.   On: 03/22/2022 14:41    Procedures Fecal disimpaction  Date/Time: 03/22/2022 1:56 PM  Performed by: Godfrey Pick, MD Authorized by: Godfrey Pick, MD  Consent: Verbal consent obtained. Consent given by: patient Patient understanding: patient states understanding of the procedure being performed Patient identity confirmed: verbally with patient Patient tolerance: patient tolerated the procedure well with no immediate complications       Medications Ordered in ED Medications  sodium chloride 0.9 % bolus 500 mL (has no administration in time range)  0.9 %  sodium chloride infusion (has no administration in time range)  ceFEPIme (MAXIPIME) 2 g in  sodium chloride 0.9 % 100 mL IVPB (2 g Intravenous New  Bag/Given 03/22/22 1538)  metroNIDAZOLE (FLAGYL) IVPB 500 mg (has no administration in time range)  vancomycin (VANCOREADY) IVPB 1500 mg/300 mL (has no administration in time range)  vancomycin variable dose per unstable renal function (pharmacist dosing) (has no administration in time range)  ceFEPIme (MAXIPIME) 2 g in sodium chloride 0.9 % 100 mL IVPB (has no administration in time range)  fentaNYL (SUBLIMAZE) injection 50 mcg (has no administration in time range)  sodium chloride 0.9 % bolus 1,000 mL (0 mLs Intravenous Stopped 03/22/22 1502)  sodium chloride 0.9 % bolus 1,000 mL (1,000 mLs Intravenous New Bag/Given 03/22/22 1509)    ED Course/ Medical Decision Making/ A&P                           Medical Decision Making Amount and/or Complexity of Data Reviewed Labs: ordered. Radiology: ordered.  Risk Prescription drug management.   This patient presents to the ED for concern of hypotension and and wound infection, this involves an extensive number of treatment options, and is a complaint that carries with it a high risk of complications and morbidity.  The differential diagnosis includes sepsis, septic shock, anemia, dehydration, polypharmacy   Co morbidities that complicate the patient evaluation  COPD, OSA, T2DM, depression, HLD, HTN, anemia, CKD, CHF, TIA, GERD, gastroparesis, bradycardia s/p pacemaker, and anxiety   Additional history obtained:  Additional history obtained from EMS External records from outside source obtained and reviewed including EMR   Lab Tests:  I Ordered, and personally interpreted labs.  The pertinent results include: AKI, mild hyperkalemia, anion gap metabolic acidosis, leukocytosis are all present.  Initial lactic acid was normal.  Urinalysis does show evidence of infection, however, this could be colonization from her indwelling Foley catheter.  Hemoglobin is increased from baseline.   Imaging Studies ordered:  I ordered imaging studies  including chest x-ray, CT of abdomen and pelvis I independently visualized and interpreted imaging which showed no acute findings on chest x-ray, CT scan pending at time of signout I agree with the radiologist interpretation   Cardiac Monitoring: / EKG:  The patient was maintained on a cardiac monitor.  I personally viewed and interpreted the cardiac monitored which showed an underlying rhythm of: Sinus rhythm   Problem List / ED Course / Critical interventions / Medication management  Patient is a 61 year old female presenting from nursing facility for concern of wound infection and hypotension.  Per EMS, hypotension began today.  She has had ongoing wounds to her RLE stump as well as sacral area.  Patient reports worsened pain in these areas.  Blood pressure on arrival is in the 80s over 50s.  Bolus of IV fluids was initiated.  On exam, patient has a small wound to her right stump.  There is no surrounding erythema no obvious purulent drainage.  It is foul-smelling.  On inspection of her sacral wound, there is some evidence of purulence.  Wound is extensive.  On check of her rectal temperature, she was found to have fecal impaction.  Patient underwent manual disimpaction.  Following first liter of IVF, patient remained hypotensive.  Additional 1.5 L was ordered.  Given the concern for sepsis, patient was started on broad-spectrum antibiotics.  Lab work is notable for leukocytosis, AKI, anion gap metabolic acidosis, mild hyperkalemia, and evidence of UTI.  Urine studies may be secondary to colonization from indwelling Foley catheter.  At time  of signout, CT scan was pending.  Thus far, her blood pressures have responded to IV fluids.  Care of patient was signed out to oncoming ED provider. I ordered medication including IV fluids for hypotension; broad-spectrum antibiotics for sepsis; fentanyl for analgesia Reevaluation of the patient after these medicines showed that the patient improved I have  reviewed the patients home medicines and have made adjustments as needed   Social Determinants of Health:  Lives in nursing facility   CRITICAL CARE Performed by: Gloris Manchester   Total critical care time: 35 minutes  Critical care time was exclusive of separately billable procedures and treating other patients.  Critical care was necessary to treat or prevent imminent or life-threatening deterioration.  Critical care was time spent personally by me on the following activities: development of treatment plan with patient and/or surrogate as well as nursing, discussions with consultants, evaluation of patient's response to treatment, examination of patient, obtaining history from patient or surrogate, ordering and performing treatments and interventions, ordering and review of laboratory studies, ordering and review of radiographic studies, pulse oximetry and re-evaluation of patient's condition.         Final Clinical Impression(s) / ED Diagnoses Final diagnoses:  AKI (acute kidney injury) (HCC)  Hypotension, unspecified hypotension type  Wound of sacral region, initial encounter    Rx / DC Orders ED Discharge Orders     None         Gloris Manchester, MD 03/22/22 1559

## 2022-03-22 NOTE — Progress Notes (Signed)
Pharmacy Antibiotic Note  [name] is a 61 y.o. female admitted on 03/22/2022 presenting from facility with leg and sacral wounds, concern for sepsis.  Pharmacy has been consulted for vancomycin and cefepime dosing.  Plan: Vancomycin 1500 mg IV x 1, then variable dosing d/t unstable renal function Cefepime 2g IV every 24 hours Monitor renal function, Cx and clinical progression to narrow Vancomycin levels as needed  Height: 5\' 5"  (165.1 cm) Weight: 85.5 kg (188 lb 7.9 oz) IBW/kg (Calculated) : 57  Temp (24hrs), Avg:97.9 F (36.6 C), Min:97.8 F (36.6 C), Max:98 F (36.7 C)  Recent Labs  Lab 03/22/22 1340 03/22/22 1354  WBC 11.2*  --   CREATININE 4.22*  --   LATICACIDVEN  --  1.6    Estimated Creatinine Clearance: 15.3 mL/min (A) (by C-G formula based on SCr of 4.22 mg/dL (H)).    Allergies  Allergen Reactions   Penicillins Hives    Has patient had a PCN reaction causing immediate rash, facial/tongue/throat swelling, SOB or lightheadedness with hypotension: No Has patient had a PCN reaction causing severe rash involving mucus membranes or skin necrosis: No Has patient had a PCN reaction that required hospitalization No Has patient had a PCN reaction occurring within the last 10 years: No If all of the above answers are "NO", then may proceed with Cephalosporin use.    Dulaglutide Diarrhea    Trulicity   Erythromycin Diarrhea   Gadolinium Nausea And Vomiting     Code: VOM, Desc: Pt began vomiting immed post infusion of multihance, Onset Date: 05/22/22    Iodine-131 Nausea And Vomiting   Ivp Dye [Iodinated Contrast Media] Nausea And Vomiting        Metformin Diarrhea    [id number], PharmD Clinical Pharmacist ED Pharmacist Phone # [phone] 03/22/2022 3:50 PM

## 2022-03-22 NOTE — H&P (Cosign Needed Addendum)
Date: 03/23/2022               Patient Name:  [name] MRN: [id number]  DOB: Jun 29, 1961 Age / Sex: 61 y.o., female   PCP: Loyal Jacobson, MD         Medical Service: Internal Medicine Teaching Service         Attending Physician: Dr. Inez Catalina, MD         First Contact :  Dr. Sherrilee Gilles [phone]  Second Contact:     Dr. Evie Lacks [phone]      After Hours (After 5p/  First Contact Pager: [phone]  weekends / holidays): Second Contact Pager: [phone]   SUBJECTIVE   Chief Complaint: Hypotension  History of Present Illness:   [name] is a 61 year old person living with a history of HFpEF, COPD, GERD, hyperlipidemia, hypertension, hypothyroidism, OSA on CPAP, PVD, sinus bradycardia status post pacemaker, insulin-dependent type 2 diabetes recently hospitalized at [facility] from 6/18 to 6/27 for a sacral wound requiring incision and drainage by general surgery and a prolonged course of antibiotics.  She presents today from her nursing facility [facility] for hypotension.  On exam patient states that she has not felt well the past few days.  She has had poor appetite and has felt increasingly fatigued.  She denies fevers chills, cough, shortness of breath, chest pain.  She denied nausea vomiting or diarrhea.  She did endorse increased pain around her sacral wound.  Patient's sister and brother-in-law at bedside who are uncertain of patient's baseline mental status but states that at times she is confused since being hospitalized last month.  I called and spoke with [facility] evening nurse who states patient sent to the ED because wound care nurse saw a scab fall off of patient's sacral wound. Patient was at baseline per nurse yesterday evening. 72/46, glucose at 81 and went up to 117 before patient sent to hospital. Foley replaced weekly  Her prior hospitalization wound cultures were positive for Proteus mirabilis and Staph aureus, the patient  was continued on cefazolin and metronidazole and changed to cefadroxil metronidazole at discharge and she completed this course of antibiotics on July 1.  ED Course: MAPS on initial presentation of 66,61 that responded appropriately to IV fluids. Also found to have AKI with Cr of 4, BUN of 80, bicarb of 19, K 5.2. WBC of 11.2. UA with leukoyctes, WBC over 50 with rare bacteria.  Lactic acid 1.6.  She was admitted to our services with suspicion for sepsis.  Blood cultures were drawn and she was started on cefepime and vancomycin.  Meds:  Mirtazapine 7.5 mg  Oxybutynin 10 mg daily Oxycodone 5 mg every 4 hours for pain Rosuvastatin 40 mg Protonix 40 mg daily Metoclopramide 10 mg 4x daily before meals Lisinopril 2.5 mg daily Levothyroxine 50 mcg Fenofibrate 160 mg daily Fluoxetine 10 mg daily Farxiga 10 mg daily Carvedilol 3.125 twice daily Vitamin B12 Plavix 75 mg daily Glimepiride 4 mg BID w/ meals Glargine 20 units nightly Tresiba 20 units daily Myrbetriq  Past Medical History Past Surgical History:  Procedure Laterality Date   AMPUTATION Left 06/22/2017   Procedure: LEFT BELOW KNEE AMPUTATION;  Surgeon: Nadara Mustard, MD;  Location: [facility] OR;  Service: Orthopedics;  Laterality: Left;   BLADDER SURGERY  1970's   "stretched the mouth of my bladder" (04/21/2013)   DILATION AND CURETTAGE OF UTERUS  1980's   FOOT SURGERY Right 2012   "put pins  in one year; amputated toes another OR" (04/21/2013)   GANGLION CYST EXCISION Left 1990's   "wrist" (04/21/2013)   I & D EXTREMITY Left 08/25/2013   Procedure: IRRIGATION AND DEBRIDEMENT EXTREMITY;  Surgeon: Axel Filler, MD;  Location: [facility] OR;  Service: General;  Laterality: Left;   INCISION AND DRAINAGE ABSCESS N/A 01/24/2013   Procedure: INCISION AND DRAINAGE ABSCESS;  Surgeon: Clovis Pu. Cornett, MD;  Location: [facility] OR;  Service: General;  Laterality: N/A;   INCISION AND DRAINAGE ABSCESS Left 02/06/2022   Procedure: INCISION AND DRAINAGE ABSCESS;   Surgeon: Emelia Loron, MD;  Location: [facility] ORS;  Service: General;  Laterality: Left;   INCISION AND DRAINAGE ABSCESS N/A 02/08/2022   Procedure: INCISION AND DRAINAGE ABSCESS PERINEUM;  Surgeon: Emelia Loron, MD;  Location: [facility] ORS;  Service: General;  Laterality: N/A;   INGUINAL HERNIA REPAIR N/A 01/28/2013   Procedure: I&D Right Groin Wound;  Surgeon: Shelly Rubenstein, MD;  Location: [facility] OR;  Service: General;  Laterality: N/A;   LEG AMPUTATION BELOW KNEE Right 2012   PERMANENT PACEMAKER INSERTION N/A 03/05/2014   Biotronik dual chamber pacemaker implanted by Dr Graciela Husbands for symptomatic bradycardia   REFRACTIVE SURGERY Bilateral 2014   STUMP REVISION Right 12/11/2014   Procedure: Right Below Knee Amputation Revision;  Surgeon: Nadara Mustard, MD;  Location: [facility] OR;  Service: Orthopedics;  Laterality: Right;   TOE AMPUTATION Left ?2011    only 2 toes remaning.    TUBAL LIGATION  1990's   WRIST SURGERY Right 1980's   "pinched nerve" (04/21/2013)   Social:  Lives at [facility] nursing home Support: Dependent for ADLs and IADLs Level of Function: Has family in [place] and brother who lives in the area PCP: Loyal Jacobson Substances: Denies substance use  Allergies: Allergies as of 03/22/2022 - Review Complete 03/22/2022  Allergen Reaction Noted   Penicillins Hives 01/30/2008   Dulaglutide Diarrhea 01/25/2021   Erythromycin Diarrhea 12/07/2014   Gadolinium Nausea And Vomiting 03/28/2010   Iodine-131 Nausea And Vomiting 07/09/2015   Ivp dye [iodinated contrast media] Nausea And Vomiting 10/06/2011   Metformin Diarrhea 08/17/2010   Review of Systems: A complete ROS was negative except as per HPI.   OBJECTIVE:   Physical Exam: Blood pressure (!) 118/41, pulse 65, temperature 97.8 F (36.6 C), temperature source Oral, resp. rate 11, height 5\' 5"  (1.651 m), weight 85.5 kg, SpO2 99 %.  Constitutional: No acute distress HENT: normocephalic atraumatic, mucous membranes  dry Eyes: conjunctiva non-erythematous Neck: supple Cardiovascular: regular rate and rhythm, no m/r/g Pulmonary/Chest: normal work of breathing on room air, lungs clear to auscultation bilaterally Abdominal: soft, non-tender, non-distended MSK: normal bulk and tone. R. BKA, L. BKA. Right BKA residual limb with distal wound with ulceration and boney exposure. GU: Foley catheter in place Neurological: alert & oriented x 3 Skin: warm and dry. 16cmx12cm sacral wound with rolled edges and pink tissue. Unstagable 2 cm ulcer on right gluteus.  Psych: tearful at times  Labs: CBC    Component Value Date/Time   WBC 8.6 03/23/2022 0458   RBC 2.74 (L) 03/23/2022 0458   HGB 7.3 (L) 03/23/2022 0458   HCT 24.8 (L) 03/23/2022 0458   PLT 318 03/23/2022 0458   MCV 90.5 03/23/2022 0458   MCH 26.6 03/23/2022 0458   MCHC 29.4 (L) 03/23/2022 0458   RDW 16.4 (H) 03/23/2022 0458   LYMPHSABS 2.8 03/23/2022 0458   MONOABS 0.6 03/23/2022 0458   EOSABS 0.1 03/23/2022 0458   BASOSABS  0.1 03/23/2022 0458     CMP     Component Value Date/Time   NA 142 03/23/2022 0458   K 4.1 03/23/2022 0458   CL 113 (H) 03/23/2022 0458   CO2 17 (L) 03/23/2022 0458   GLUCOSE 42 (LL) 03/23/2022 0458   BUN 68 (H) 03/23/2022 0458   CREATININE 3.12 (H) 03/23/2022 0458   CALCIUM 10.3 03/23/2022 0458   PROT 6.2 (L) 03/23/2022 0458   ALBUMIN 1.9 (L) 03/23/2022 0458   AST 23 03/23/2022 0458   ALT 15 03/23/2022 0458   ALKPHOS 109 03/23/2022 0458   BILITOT 0.8 03/23/2022 0458   GFRNONAA 16 (L) 03/23/2022 0458   GFRAA 43 (L) 02/27/2020 1216    Imaging:  Chest xray - Pulmonary congestion but no other acute cardiopulmonary process seen.  EKG: personally reviewed my interpretation is 75 BPM sinus rhythm. LAFB. LVH. No changes compared to prior.    ASSESSMENT & PLAN:   Assessment & Plan by Problem: Principal Problem:   AKI (acute kidney injury) (HCC)   Tayloranne A Kettlewell is a 61 y.o. person living with a history of   HFpEF, COPD, GERD, hyperlipidemia, hypertension, hypothyroidism, OSA on CPAP, PVD, sinus bradycardia status post pacemaker, insulin-dependent type 2 diabetes  who presented with hypotension and admitted for hypotension and concern for sepsis on hospital day 1  Suspected Sepsis Right BKA s/p transtibial amputation with ulceration and bony exposure Presents with hypotension, leukocytosis, and AKI. MAPS improved with IV fluids. IV antibiotics of cefepime and vancomycin started in ED. Chronic sacral wound appears to be healing well when compared to photos in media from last month. Possible sources of infection is RLE ulcer with boney exposure. Xray with evidence of soft tissue ulceration unable to rule out osteomyelitis. Unable to obtain MRI with patient's history of pacemaker. Do not believe CT would be beneficial as cannot give IV contrast with significant AKI. Do not suspect UTI, UA likely colonization from her chronic foley catheter.  -continue vancomycin and cefepime day 1 -consult orthopedics for RLE wound tomorrow -continue volume resuscitation with IV fluids -follow BC x2  AKI on CKDIIIb Anion Gap Metabolic Acidosis Hyperkalemia Chronic Foley Catheter Baseline Cr of 1.5 from prior hospitalization. Cr of 4.22 w/ BUN of 80.  Lactic acid normal. UA w/o casts, RBC. Proteinuria of 100. Large WBC and rare bacteria, suspect colonization with chronic foley catheter. Facility states they change weekly. Renal US negative for obstructive etiology. uspect pre-renal etiology in setting of hypotension and poor PO intake. Continue IV fluids and repeat Cr tomorrow. No need for emergent dialysis at this time. Without EKG changes to indicate need for hyperkalemia treatment.  -trend renal function -continue LR 100 cc's for 5 hours -strict I/O -if worsening renal function, consider nephrology referral -foley care  Chronic sacral wound Chronic sacral wound, prior hospitalization for I&D of area antibiotic  therapy. When comparing to prior hospitalization images, wound has significant improvement and is without evidence of acute infection. CT a/p did not comment on soft tissue abnormalities or suspicion of infection.  -wound care consult placed -continue home pain regimen of oxycodone and tylenol for chronic pain  Moderate Hypercalcemia Corrected calcium of 12.9. Asymptotic, continue to trend. Hopeful to improve with IV fluids and improvement in renal function.   HFpEF Last echo 2017, EF 65-70%. No evidence of hypervolemia on examination.   Hx of TIA On plavix and aspirin  Normocytic Anemia  Suspect anemia of chronic disease in setting of multiple chronic wounds. Continue to  monitor  Insulin dependent type 2 diabetes mellitus SSI w/ meals If elevated glucose in the am, start long acting insulin  Hx of hypertension Hold lisinopril and carvedilol with hypotension and AKI  Hypothyroidism Continue levothyroxine and assess TSH levels  HLD Continue statin  OSA CPAP QHS  GERD Continue protonix daily  Diet: Normal VTE: Enoxaparin IVF: LR,100cc/hr Code: Full  Prior to Admission Living Arrangement:  [employer] Anticipated Discharge Location: SNF  Dispo: Admit patient to Inpatient with expected length of stay greater than 2 midnights.  Signed: Belva Agee, MD Internal Medicine Resident PGY-3  03/23/2022, 6:47 AM

## 2022-03-22 NOTE — Sepsis Progress Note (Signed)
eLink is following this Code Sepsis. °

## 2022-03-22 NOTE — ED Notes (Signed)
Unable to get patient blood for 2nd set of blood cultures.

## 2022-03-22 NOTE — ED Triage Notes (Signed)
Pt BIB [facility] from [place] due to suspected wound infection on her right leg and sacral wound.  Pt endorses extreme pain.  VS BP 89/58, CBG 175.

## 2022-03-22 NOTE — ED Notes (Signed)
Patient transported to CT 

## 2022-03-22 NOTE — Hospital Course (Addendum)
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 Exam noted right LE ulceration with bony exposure. Xray showed  evidence of soft tissue ulceration unable to rule out osteomyelitis. Unable to obtain MRI with history of pacemaker. IV antibiotics started for concern of osteomyelitis. Orthopedics was consulted and performed right AKA during hospital course. Blood cultures negative. Wound vacc in place and continued wound care.  Acute on chronic kidney injury 3B Elevated BUN Baseline creatinine of 1.5 from prior hospitalization. Presented with creatinine of 4.22. UA showed proteinuria and leukocytes and rare bacteria. Has foley catheter in place from facility. Renal US normal. Cause is likely pre-renal etiology from hypotension and poor p.o. intake. Creatinine improved over course but BUN continued to increase likely from increased protein intake via feeding tube. No evidence of bleeding, Hgb was steady.   Encephalopathy Patient for most of the course is alert and oriented to person and place. Likely multifactorial with history of poor nutrition, chronic wounds, s/p right AKA and kidney injury. She is unable to make decisions for herself due to the confusion and disorientation. Sister and brother present during admission and throughout hospital course. Palliative care consulted due to worsening mentation and poor prognosis. Discussed with family about patient's goals of care. After extensive treatment and care, ultimately family decided on comfort measures.   Malnutrition Electrolyte abnormalities Patient has poor po intake. Does not want to eat leading to electrolyte depletions. Cortrak was placed with continuous feeding along with nutritional supplements. Improvements with replenishing electrolytes and some improvement on renal function. Although patient was receiving nutrition via feeding  tube, she continues to refuse meals. Discussed with family and palliative care about the decline and agreed upon comfort measures. Stopped tube feedings and will provide food if she wants to eat.   Chronic sacral wound Sacral wound was debrided on prior hospitalization with antibiotic therapy. No evidence of acute infection on exam. Considering her poor nutrition status and uncontrolled diabetes, poor wound healing likely. Provided wound care along with flexi-seal to prevent stools from reaching the wound area.   Episodic Hypoglycemia  Insulin-dependent type 2 diabetes mellitus Due to the poor po intake, patient was having hypoglycemic episodes. Cortrak was placed which resolved the hypoglycemia. Adjusted insulin regimen during course to achieve better glycemic control. Other etiologies of the hypoglycemia were considered, AM cortisol was normal.   HFpEF 2017 Echo showed EF of 65-70%. She was not hypervolemic on exam.   Normocytic anemia Anemia of chronic disease in setting of multiple chronic wounds. She received blood transfusion prior to surgery for right AKA.   Thrush Patient developed oral thrush. Exam showed few white plaques along hard palate. Started on antifungal therapy.   Advance Care Planning Patient for most of her hospital stay was confused and unable to make decisions on her own. She remains alert and oriented to self and at times place. Discussed with family regarding patient's goals and wishes. After much consideration and no significant improvement over course of several weeks, patient was transitioned to full comfort measures and hospice care.

## 2022-03-22 NOTE — Progress Notes (Signed)
Patient has arrived on the floor, CHG given, Bed alarm on and call bell given and safety ensured. Skin assessed with Allene Pyo, RN.

## 2022-03-22 NOTE — ED Provider Notes (Signed)
Signout from Dr. Durwin Nora.  61 year old female with known decubitus ulcer nursing home here with increased pain at her wounds and low blood pressure.  Has new AKI.  Is covered for sepsis and getting fluids.  CT does not show any acute intra-abdominal pathology. Physical Exam  BP (!) 114/51   Pulse 69   Temp 97.8 F (36.6 C) (Rectal)   Resp 16   Ht 5\' 5"  (1.651 m)   Wt 85.5 kg   SpO2 100%   BMI 31.37 kg/m   Physical Exam  Procedures  Procedures  ED Course / MDM    Medical Decision Making Amount and/or Complexity of Data Reviewed Labs: ordered. Radiology: ordered.  Risk Prescription drug management.   Discussed with internal medicine teaching service who will evaluate patient for admission.       , MD 03/23/22 1328

## 2022-03-23 DIAGNOSIS — E875 Hyperkalemia: Secondary | ICD-10-CM

## 2022-03-23 DIAGNOSIS — E039 Hypothyroidism, unspecified: Secondary | ICD-10-CM

## 2022-03-23 DIAGNOSIS — E872 Acidosis, unspecified: Secondary | ICD-10-CM

## 2022-03-23 DIAGNOSIS — Z960 Presence of urogenital implants: Secondary | ICD-10-CM

## 2022-03-23 DIAGNOSIS — E1122 Type 2 diabetes mellitus with diabetic chronic kidney disease: Secondary | ICD-10-CM

## 2022-03-23 DIAGNOSIS — N179 Acute kidney failure, unspecified: Secondary | ICD-10-CM | POA: Diagnosis not present

## 2022-03-23 DIAGNOSIS — Z89511 Acquired absence of right leg below knee: Secondary | ICD-10-CM

## 2022-03-23 DIAGNOSIS — I5022 Chronic systolic (congestive) heart failure: Secondary | ICD-10-CM

## 2022-03-23 DIAGNOSIS — K219 Gastro-esophageal reflux disease without esophagitis: Secondary | ICD-10-CM

## 2022-03-23 DIAGNOSIS — D500 Iron deficiency anemia secondary to blood loss (chronic): Secondary | ICD-10-CM

## 2022-03-23 DIAGNOSIS — T8789 Other complications of amputation stump: Secondary | ICD-10-CM

## 2022-03-23 DIAGNOSIS — T83511A Infection and inflammatory reaction due to indwelling urethral catheter, initial encounter: Secondary | ICD-10-CM

## 2022-03-23 DIAGNOSIS — Z8673 Personal history of transient ischemic attack (TIA), and cerebral infarction without residual deficits: Secondary | ICD-10-CM

## 2022-03-23 DIAGNOSIS — N1832 Chronic kidney disease, stage 3b: Secondary | ICD-10-CM | POA: Diagnosis not present

## 2022-03-23 DIAGNOSIS — G4733 Obstructive sleep apnea (adult) (pediatric): Secondary | ICD-10-CM

## 2022-03-23 DIAGNOSIS — F17210 Nicotine dependence, cigarettes, uncomplicated: Secondary | ICD-10-CM

## 2022-03-23 DIAGNOSIS — Z794 Long term (current) use of insulin: Secondary | ICD-10-CM

## 2022-03-23 LAB — COMPREHENSIVE METABOLIC PANEL
ALT: 15 U/L (ref 0–44)
AST: 23 U/L (ref 15–41)
Albumin: 1.9 g/dL — ABNORMAL LOW (ref 3.5–5.0)
Alkaline Phosphatase: 109 U/L (ref 38–126)
Anion gap: 12 (ref 5–15)
BUN: 68 mg/dL — ABNORMAL HIGH (ref 6–20)
CO2: 17 mmol/L — ABNORMAL LOW (ref 22–32)
Calcium: 10.3 mg/dL (ref 8.9–10.3)
Chloride: 113 mmol/L — ABNORMAL HIGH (ref 98–111)
Creatinine, Ser: 3.12 mg/dL — ABNORMAL HIGH (ref 0.44–1.00)
GFR, Estimated: 16 mL/min — ABNORMAL LOW (ref >60)
Glucose, Bld: 42 mg/dL — CL (ref 70–99)
Potassium: 4.1 mmol/L (ref 3.5–5.1)
Sodium: 142 mmol/L (ref 135–145)
Total Bilirubin: 0.8 mg/dL (ref 0.3–1.2)
Total Protein: 6.2 g/dL — ABNORMAL LOW (ref 6.5–8.1)

## 2022-03-23 LAB — GLUCOSE, CAPILLARY
Glucose-Capillary: 105 mg/dL — ABNORMAL HIGH (ref 70–99)
Glucose-Capillary: 115 mg/dL — ABNORMAL HIGH (ref 70–99)
Glucose-Capillary: 133 mg/dL — ABNORMAL HIGH (ref 70–99)
Glucose-Capillary: 138 mg/dL — ABNORMAL HIGH (ref 70–99)
Glucose-Capillary: 141 mg/dL — ABNORMAL HIGH (ref 70–99)
Glucose-Capillary: 184 mg/dL — ABNORMAL HIGH (ref 70–99)
Glucose-Capillary: 29 mg/dL — CL (ref 70–99)
Glucose-Capillary: 48 mg/dL — ABNORMAL LOW (ref 70–99)
Glucose-Capillary: 81 mg/dL (ref 70–99)

## 2022-03-23 LAB — CBC WITH DIFFERENTIAL/PLATELET
Abs Immature Granulocytes: 0.04 10*3/uL (ref 0.00–0.07)
Basophils Absolute: 0.1 10*3/uL (ref 0.0–0.1)
Basophils Relative: 1 %
Eosinophils Absolute: 0.1 10*3/uL (ref 0.0–0.5)
Eosinophils Relative: 1 %
HCT: 24.8 % — ABNORMAL LOW (ref 36.0–46.0)
Hemoglobin: 7.3 g/dL — ABNORMAL LOW (ref 12.0–15.0)
Immature Granulocytes: 1 %
Lymphocytes Relative: 33 %
Lymphs Abs: 2.8 10*3/uL (ref 0.7–4.0)
MCH: 26.6 pg (ref 26.0–34.0)
MCHC: 29.4 g/dL — ABNORMAL LOW (ref 30.0–36.0)
MCV: 90.5 fL (ref 80.0–100.0)
Monocytes Absolute: 0.6 10*3/uL (ref 0.1–1.0)
Monocytes Relative: 7 %
Neutro Abs: 5 10*3/uL (ref 1.7–7.7)
Neutrophils Relative %: 57 %
Platelets: 318 10*3/uL (ref 150–400)
RBC: 2.74 MIL/uL — ABNORMAL LOW (ref 3.87–5.11)
RDW: 16.4 % — ABNORMAL HIGH (ref 11.5–15.5)
WBC: 8.6 10*3/uL (ref 4.0–10.5)
nRBC: 0.2 % (ref 0.0–0.2)

## 2022-03-23 LAB — VANCOMYCIN, RANDOM: Vancomycin Rm: 25 ug/mL

## 2022-03-23 LAB — MAGNESIUM: Magnesium: 1.9 mg/dL (ref 1.7–2.4)

## 2022-03-23 LAB — TSH: TSH: 0.92 u[IU]/mL (ref 0.350–4.500)

## 2022-03-23 LAB — LACTIC ACID, PLASMA
Lactic Acid, Venous: 0.8 mmol/L (ref 0.5–1.9)
Lactic Acid, Venous: 1.1 mmol/L (ref 0.5–1.9)

## 2022-03-23 MED ORDER — LACTATED RINGERS IV SOLN: INTRAVENOUS | Status: DC

## 2022-03-23 MED ORDER — SODIUM CHLORIDE 0.9 % IV BOLUS
1000.0000 mL | Freq: Once | INTRAVENOUS | Status: AC
  Administered 2022-03-23: 1000 mL via INTRAVENOUS

## 2022-03-23 MED ORDER — ADULT MULTIVITAMIN W/MINERALS CH
1.0000 | ORAL_TABLET | Freq: Every day | ORAL | Status: DC
  Administered 2022-03-23 – 2022-04-08 (×17): 1 via ORAL
  Filled 2022-03-23 (×17): qty 1

## 2022-03-23 MED ORDER — DEXTROSE 50 % IV SOLN: 50.0000 mL | INTRAVENOUS | Status: AC

## 2022-03-23 MED ORDER — ENSURE ENLIVE PO LIQD
237.0000 mL | Freq: Two times a day (BID) | ORAL | Status: DC
  Administered 2022-03-24 – 2022-03-28 (×5): 237 mL via ORAL

## 2022-03-23 MED ORDER — SODIUM CHLORIDE 0.9 % IV BOLUS: 500.0000 mL | Freq: Once | INTRAVENOUS | Status: DC

## 2022-03-23 MED ORDER — LACTATED RINGERS IV SOLN
INTRAVENOUS | Status: AC
Start: 2022-03-23 — End: 2022-03-23

## 2022-03-23 MED ORDER — DEXTROSE 50 % IV SOLN
INTRAVENOUS | Status: AC
  Administered 2022-03-23: 50 mL via INTRAVENOUS
  Filled 2022-03-23: qty 50

## 2022-03-23 MED ORDER — ROSUVASTATIN CALCIUM 20 MG PO TABS
40.0000 mg | ORAL_TABLET | Freq: Every day | ORAL | Status: DC
  Administered 2022-03-23 – 2022-04-08 (×17): 40 mg via ORAL
  Filled 2022-03-23 (×17): qty 2

## 2022-03-23 MED ORDER — DEXTROSE 5 % IV BOLUS
500.0000 mL | Freq: Once | INTRAVENOUS | Status: AC
  Administered 2022-03-23: 500 mL via INTRAVENOUS

## 2022-03-23 MED ORDER — LACTATED RINGERS IV BOLUS
300.0000 mL | Freq: Once | INTRAVENOUS | Status: AC
Start: 2022-03-23 — End: 2022-03-24
  Administered 2022-03-23: 300 mL via INTRAVENOUS

## 2022-03-23 NOTE — Plan of Care (Signed)
  Problem: Education: Goal: Knowledge of General Education information will improve Description: Including pain rating scale, medication(s)/side effects and non-pharmacologic comfort measures Outcome: Progressing   Problem: Clinical Measurements: Goal: Ability to maintain clinical measurements within normal limits will improve Outcome: Progressing Goal: Will remain free from infection Outcome: Progressing Goal: Diagnostic test results will improve Outcome: Progressing Goal: Respiratory complications will improve Outcome: Progressing Goal: Cardiovascular complication will be avoided Outcome: Progressing   Problem: Elimination: Goal: Will not experience complications related to bowel motility Outcome: Progressing Goal: Will not experience complications related to urinary retention Outcome: Progressing   Problem: Pain Managment: Goal: General experience of comfort will improve Outcome: Progressing   Problem: Skin Integrity: Goal: Risk for impaired skin integrity will decrease Outcome: Progressing   Problem: Safety: Goal: Ability to remain free from injury will improve Outcome: Progressing   Problem: Nutritional: Goal: Maintenance of adequate nutrition will improve Outcome: Progressing Goal: Progress toward achieving an optimal weight will improve Outcome: Progressing

## 2022-03-23 NOTE — Progress Notes (Signed)
Initial Nutrition Assessment  DOCUMENTATION CODES:   Not applicable  INTERVENTION:  Encourage adequate PO intake If PO intake does not improve would recommend Cortrak placement Ensure Enlive po BID, each supplement provides 350 kcal and 20 grams of protein MVI with minerals daily Check micronutrient labs: Zinc, Vitamin C, Vitamin A  NUTRITION DIAGNOSIS:   Increased nutrient needs related to wound healing as evidenced by estimated needs.  GOAL:   Patient will meet greater than or equal to 90% of their needs  MONITOR:   PO intake, Supplement acceptance, Labs, Weight trends  REASON FOR ASSESSMENT:   Malnutrition Screening Tool, Consult Assessment of nutrition requirement/status  ASSESSMENT:   Pt admitted with hypotension, found to have AKI. PMH significant for CHF, COPD, GERD, HLD, HTN, hypothyroidism, OSA on CPAP, PVD, sinus bradycardia s/p PPM, T2DM. Recent admission 6/18-6/27 for sacral wound requiring I&D.   Pending ortho consult of R stump ulceration.   Attempted to speak with pt at bedside. She was sleeping at time of visit. No family present at bedside. Spoke with RN outside room. She states that pt has been in pain and was given medications to help but this made her drowsy.   She had not eaten this morning and only had a few bites of apple sauce. Having difficulty with episodes of hypoglycemia during admission which has been corrected with use of juice.   Meal completions: 8/3: 25% x2 recorded meals  Unfortunately there is limited documentation of wt history on file within the last year. Pt noted to have had wt loss of 12.8% from 03/2019 to 03/2022 but uncertain the time frame in which this wt loss has occurred.   Medications: SSI 0-15 units TID, synthroid, protonix, IV abx  Labs: BUN 69, Cr 3.12, GFR 16, HgbA1c 12.6%, CBG's 29-184 x24 hours  NUTRITION - FOCUSED PHYSICAL EXAM:  Flowsheet Row Most Recent Value  Orbital Region No depletion  Upper Arm Region No  depletion  Thoracic and Lumbar Region No depletion  Buccal Region No depletion  Temple Region Mild depletion  Clavicle Bone Region No depletion  Clavicle and Acromion Bone Region No depletion  Scapular Bone Region No depletion  Dorsal Hand No depletion  Patellar Region Moderate depletion  Anterior Thigh Region Moderate depletion  Posterior Calf Region Unable to assess  Edema (RD Assessment) None  Hair Reviewed  Eyes Unable to assess  Mouth Unable to assess  Skin Reviewed  Nails Reviewed       Diet Order:   Diet Order             Diet regular Room service appropriate? Yes; Fluid consistency: Thin  Diet effective now                   EDUCATION NEEDS:   Not appropriate for education at this time  Skin:  Skin Assessment: Skin Integrity Issues: Skin Integrity Issues:: Other (Comment) Other: WOC-Right BKA posterior wound is circular, yellow, black and pink with scant purulent drainage, measuring 3.5 x 2; Sacral wound measures 12.5 x 11 x 3.5 with rolled edges and boggy pink protruding tissue  Last BM:  PTA  Height:   Ht Readings from Last 1 Encounters:  03/22/22 5\' 5"  (1.651 m)    Weight:   Wt Readings from Last 1 Encounters:  03/22/22 85 kg    Ideal Body Weight:     BMI:  Body mass index is 31.18 kg/m.  Estimated Nutritional Needs:   Kcal:  1700-1900  Protein:  85-100g  Fluid:  >/=1.7L  Clayborne Dana, RDN, LDN Clinical Nutrition

## 2022-03-23 NOTE — Progress Notes (Signed)
Pt states she does not want to use a cpap and is refusing to use tonight. Cpap at bedside and PT informed if she changes her mind to have nurse call RT.

## 2022-03-23 NOTE — Progress Notes (Signed)
Pts BG 48. Pt alert and able to take in PO intake. Pt given OJ and pts recheck BG was 115. Pt currently eating dinner meal at this time. Will continue to monitor.

## 2022-03-23 NOTE — Progress Notes (Addendum)
Evaluated at bedside. BP improved to 106/49 after 1L bolus. Patient is awake and alert. She is not in any acute distress. She appears dry on exam. No LE edema. No lungs crackles. Start LR 125/h x 8 h and encourage PO intake. Can give additional fluid bolus if needed.  Her Right stump ulceration does not appear grossly infected with no drainage but she might have deeper infection. No contrast imaging due to her AKI. Orthopedic was consulted and will evaluate patient today. Pending Lactic acid.

## 2022-03-23 NOTE — Progress Notes (Signed)
HD#1 Subjective:   Summary: Ms. Rossiter reports minimal improvement since yesterday. Denies chest pain, SOB, dizziness, lightheadedness, nausea, or vomiting.   Overnight Events: ~40 beats of junctional rhythm, asymptomatic, noted that pacemaker was checked in May. CBG was 29 this morning and patient was lethargic. Given juice/soda and D50 32ml, which improved CGB 184. She was feeling more alert and denies any new symptoms.   Objective:  Vital signs in last 24 hours: Vitals:   03/23/22 0406 03/23/22 0634 03/23/22 0651 03/23/22 0741  BP: (!) 109/50 (!) 82/45 (!) 113/49 (!) 110/50  Pulse: 73 75 74 94  Resp: 20 12 14 12   Temp:  98 F (36.7 C) 98.1 F (36.7 C) 98.6 F (37 C)  TempSrc:   Oral Oral  SpO2: 94% 95% 92% 98%  Weight:      Height:         Physical Exam:  Constitutional: sleeping in bed but arousable, in no acute distress HENT: normocephalic atraumatic, mucous membranes dry Neck: supple Cardiovascular: regular rate and rhythm Pulmonary/Chest: normal work of breathing on room air, lungs clear to auscultation bilaterally Abdominal: soft, non-tender, non-distended Extremities: Bilateral BKA, right BKA residual limb has ulcerated wound with bony exposure, no drainage noted Neurological: alert & oriented x 3 Skin: warm and dry, sacral wound with bandage over Psych: Normal mood and behavior  Filed Weights   03/22/22 1337 03/22/22 1915  Weight: 85.5 kg 85 kg     Intake/Output Summary (Last 24 hours) at 03/23/2022 1154 Last data filed at 03/23/2022 0615 Gross per 24 hour  Intake --  Output 1300 ml  Net -1300 ml   Net IO Since Admission: -1,300 mL [03/23/22 1154]  Pertinent Labs:    Latest Ref Rng & Units 03/23/2022    4:58 AM 03/22/2022    1:40 PM 02/12/2022    6:00 AM  CBC  WBC 4.0 - 10.5 K/uL 8.6  11.2  7.9   Hemoglobin 12.0 - 15.0 g/dL 7.3  9.1  8.0   Hematocrit 36.0 - 46.0 % 24.8  30.1  26.2   Platelets 150 - 400 K/uL 318  398  260        Latest Ref Rng  & Units 03/23/2022    4:58 AM 03/22/2022    1:40 PM 02/13/2022    5:31 AM  CMP  Glucose 70 - 99 mg/dL 42  211    BUN 6 - 20 mg/dL 68  80    Creatinine 1.73 - 1.00 mg/dL 5.67  0.14  1.03   Sodium 135 - 145 mmol/L 142  138    Potassium 3.5 - 5.1 mmol/L 4.1  5.2    Chloride 98 - 111 mmol/L 113  103    CO2 22 - 32 mmol/L 17  19    Calcium 8.9 - 10.3 mg/dL 01.3  14.3    Total Protein 6.5 - 8.1 g/dL 6.2  7.8    Total Bilirubin 0.3 - 1.2 mg/dL 0.8  0.8    Alkaline Phos 38 - 126 U/L 109  126    AST 15 - 41 U/L 23  22    ALT 0 - 44 U/L 15  16      Imaging: DG Tibia/Fibula Right  Result Date: 03/22/2022 CLINICAL DATA:  Leg wound. EXAM: RIGHT TIBIA AND FIBULA - 2 VIEW COMPARISON:  None Available. FINDINGS: There has been prior below-the-knee amputation. There is soft tissue ulceration and air overlying the distal aspect of the fibula and lateral  distal aspect of the tibia. Bony areas appear exposed in this region. There is no periosteal reaction identified. No erosive changes are seen. No acute fracture or dislocation. IMPRESSION: 1. Prior below-knee amputation. There is soft tissue ulceration in bony exposure overlying the distal tibia and fibula. Osteomyelitis not excluded. No cortical erosion or periosteal reaction. Electronically Signed   By: Darliss Cheney M.D.   On: 03/22/2022 21:00   US RENAL  Result Date: 03/22/2022 CLINICAL DATA:  Acute kidney injury. EXAM: RENAL / URINARY TRACT ULTRASOUND COMPLETE COMPARISON:  CT 03/22/2022 FINDINGS: Right Kidney: Renal measurements: 11.6 x 4.8 x 5.3 cm = volume: 154 mL. Echogenicity within normal limits. No mass or hydronephrosis visualized. Left Kidney: Renal measurements: 10.4 x 5.7 x 5.5 cm = volume: 170 mL. Echogenicity within normal limits. No mass or hydronephrosis visualized. Bladder: Bladder is not visualized, likely decompressed. Other: None. IMPRESSION: Normal ultrasound appearance of the kidneys.  No hydronephrosis. Electronically Signed   By: Burman Nieves M.D.   On: 03/22/2022 20:04   CT ABDOMEN PELVIS WO CONTRAST  Result Date: 03/22/2022 CLINICAL DATA:  Sepsis. EXAM: CT ABDOMEN AND PELVIS WITHOUT CONTRAST TECHNIQUE: Multidetector CT imaging of the abdomen and pelvis was performed following the standard protocol without IV contrast. RADIATION DOSE REDUCTION: This exam was performed according to the departmental dose-optimization program which includes automated exposure control, adjustment of the mA and/or kV according to patient size and/or use of iterative reconstruction technique. COMPARISON:  February 05, 2022.  October 16, 2018. FINDINGS: Lower chest: No acute abnormality. Hepatobiliary: No focal liver abnormality is seen. No gallstones, gallbladder wall thickening, or biliary dilatation. Pancreas: Unremarkable. No pancreatic ductal dilatation or surrounding inflammatory changes. Spleen: Normal in size without focal abnormality. Adrenals/Urinary Tract: Adrenal glands and kidneys are unremarkable. No hydronephrosis or renal obstruction is noted. Urinary bladder is decompressed secondary to Foley catheter. Stomach/Bowel: Stomach is within normal limits. Appendix appears normal. No evidence of bowel wall thickening, distention, or inflammatory changes. Vascular/Lymphatic: Aortic atherosclerosis. No enlarged abdominal or pelvic lymph nodes. Reproductive: Uterus and bilateral adnexa are unremarkable. Other: No abdominal wall hernia or abnormality. No abdominopelvic ascites. Musculoskeletal: No acute or significant osseous findings. IMPRESSION: No acute abnormality seen in the abdomen or pelvis. Aortic Atherosclerosis (ICD10-I70.0). Electronically Signed   By: Lupita Raider M.D.   On: 03/22/2022 16:09   DG Chest Port 1 View  Result Date: 03/22/2022 CLINICAL DATA:  Sepsis. EXAM: PORTABLE CHEST 1 VIEW COMPARISON:  10/18/2015 chest x-ray and 12/09/2018 through the CT scan. FINDINGS: The cardiac silhouette, mediastinal and hilar contours are within normal  limits and stable. The pacer wires are stable. The lungs are clear of an acute process. No pleural effusions or pulmonary lesions. No pneumothorax. The bony thorax is intact. IMPRESSION: No acute cardiopulmonary findings. Electronically Signed   By: Rudie Meyer M.D.   On: 03/22/2022 14:41    Assessment/Plan:   Principal Problem:   AKI (acute kidney injury) (HCC)   Patient Summary: [name] is a 61 y.o. with a pertinent PMH of HFpEF, COPD, GERD, HLD, HTN, hypothyroidism, OSA on CPAP, PVD, sinus bradycardia s/p pacemaker, and insulin-dependent type 2 diabetes who presented with hypotension and admitted for concern for sepsis.    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 Right LE ulceration with bony exposure does not appear infected. Xray with evidence of soft tissue ulceration unable to rule out osteomyelitis. Unable to obtain MRI  with history of pacemaker. Do not suspect UTI, UA likely colonization from her chronic foley catheter.  Called Ortho this morning for consult on ulcer on right LE.  -Continue vancomycin and cefepime, day 2 -pending ortho consult -continue IV fluids -follow BC x2 (no growth <24 hours)  AKI on CKD 3B Anion like acidosis Hyperkalemia Chronic Foley catheter Baseline creatinine of 1.5 from prior hospitalization.  Creatinine is improving, 3.12.  Lactic acid normal.  UA W/O casts, RBC.  Proteinuria of 100.  Large WBC and rare bacteria, suspect colonization with chronic Foley catheter.  Facility states they change weekly.  Renal US for obstruction.  Likely consider prerenal etiology hypotension and for p.o. intake.  Continue IV fluids.  Repeat potassium was 4.1 -Repeat BMP -Continue LR at 125 ml/hr -strict I/O -if worsening renal function, consider nephrology referral -foley care  Chronic sacral wound Sacral wound was debrided on prior hospitalization with  antibiotic therapy.  Wound has improved since prior hospitalization. No evidence of acute infection.  Wound care consult appreciated. -continue wound care  -continue home oxycodone and tylenol regiment for chronic pain  Moderate hypercalcemia Improving, corrected calcium 12 today. Asymptomatic. Continue IV fluids and monitor.  -repeat BMP   HFpEF 2017 Echo showed EF of 65-70%. She is not hypervolemic on exam.   Hx of TIA Continue Plavix and aspirin.   Normocytic anemia Anemia of chronic disease in setting of multiple chronic wounds. Will continue to monitor.  Insulin-dependent type 2 diabetes mellitus Patient had hypoglycemic event this morning, improved with D50. Was not taking any insulin before episode. Hold off on long acting insulin until she improves.  -CBG every 2 hours -SSI with meals  Hx of hypertension Hold lisinopril and carvedilol in setting of hypotension and AKI  Hypothyroidism TSH was 0.920 today. Will continue levothyroxine.  HLD Continue statin.  OSA CPAP qhs  GERD Continue Protonix 40 mg daily.  Diet: Normal IVF: LR,125cc/hr VTE: Enoxaparin Code: Full Family Update: arrived from [facility], sister and brother were present at admission   Dispo: Anticipated discharge to Skilled nursing facility pending rehydration and surgery evaluation.   Rana Snare, DO Internal Medicine Resident PGY-1 Please contact the on call pager after 5 pm and on weekends at [phone].

## 2022-03-23 NOTE — Progress Notes (Signed)
Pharmacy Antibiotic Note- follow-up  [name] is a 62 y.o. female admitted on 03/22/2022 presenting from facility with leg and sacral wounds, concern for sepsis.  Pharmacy was been consulted for vancomycin and cefepime dosing.  Plan: Vanco random level reported as 25 mcg/mL. No vanco dose today. Vanco random level 8/5 w/ AM labs Cefepime 2g IV every 24 hours Monitor renal function, Cx and clinical progression to narrow Vancomycin levels as needed  Height: 5\' 5"  (165.1 cm) Weight: 85 kg (187 lb 6.3 oz) IBW/kg (Calculated) : 57  Temp (24hrs), Avg:98.2 F (36.8 C), Min:97.8 F (36.6 C), Max:98.8 F (37.1 C)  Recent Labs  Lab 03/22/22 1340 03/22/22 1354 03/22/22 1548 03/23/22 0458 03/23/22 0700 03/23/22 0941  WBC 11.2*  --   --  8.6  --   --   CREATININE 4.22*  --   --  3.12*  --   --   LATICACIDVEN  --  1.6 1.0  --  0.8 1.1  VANCORANDOM  --   --   --   --   --  25     Estimated Creatinine Clearance: 20.6 mL/min (A) (by C-G formula based on SCr of 3.12 mg/dL (H)).    Allergies  Allergen Reactions   Penicillins Hives    Has patient had a PCN reaction causing immediate rash, facial/tongue/throat swelling, SOB or lightheadedness with hypotension: No Has patient had a PCN reaction causing severe rash involving mucus membranes or skin necrosis: No Has patient had a PCN reaction that required hospitalization No Has patient had a PCN reaction occurring within the last 10 years: No If all of the above answers are "NO", then may proceed with Cephalosporin use.    Dulaglutide Diarrhea    Trulicity   Erythromycin Diarrhea   Gadolinium Nausea And Vomiting     Code: VOM, Desc: Pt began vomiting immed post infusion of multihance, Onset Date: 05/23/22    Iodine-131 Nausea And Vomiting   Ivp Dye [Iodinated Contrast Media] Nausea And Vomiting        Metformin Diarrhea    Emilia Kayes BS, PharmD, BCPS Clinical Pharmacist 03/23/2022 1:27 PM  Contact: [phone] after 3  PM  "Be curious, not judgmental..." [phone]

## 2022-03-23 NOTE — Progress Notes (Signed)
Hypoglycemic Event  CBG: 29  Treatment: 8 oz juice/soda and D50 50 mL (25 gm)  Symptoms: Pale and lethargic   Follow-up CBG: Time:0636 CBG Result:184  Possible Reasons for Event: Unknown  Comments/MD notified:Sridhana, MD ordered 1 liter of NS, Ampule of D50 and recheck BS in 15 min.     Luellen Pucker Thania Woodlief

## 2022-03-23 NOTE — Consult Note (Addendum)
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 Patient has BL BKA [place] sent to IM resident and attending Cyndie Chime and Humphrey) with recommendations for an opinion from Plastics for sacral wound.  Reason for Consult: sacral wound, lower extremity wounds Wound type: Right BKA posterior wound is circular, yellow, black and pink with scant purulent drainage, measuring 3.5 x 2 with 0.5 induration from 4 o'clock to 7 o'clock Right knee scabbed wound Left BKA has a scabbed area that is on the lateral side Sacral wound measures 12.5 x 11 x 3.5 with rolled edges and boggy pink protruding tissue, tunneling of 6.5 cm proximally at 7 o'clock, induration of 6.5 cm at 9 o'clock and 3.5 cm at 12 o'clock, Serous drainage, no odor noted.  Pressure Injury POA: Yes Dressing procedure/placement/frequency: Right BKA posterior wound: clean with NS then apply a cut to fit piece of Calcium Alginate [place] # [phone]) over the wound and cover with foam dressing. Change daily.  Right knee, Left BKA scabbed areas: apply foam dressing. Change every 3 days and assess under the dressing daily. Sacral wound: Clean the area with soap and water, rinse and pat dry. Apply a moistened saline gauze over the wound and secure with sacral foam dressing. Change twice daily.  Placed patient on bedpan at the end of my visit and spoke with the NT assigned to her to let her know that she was on the bedpan.  Monitor the wound area(s) for worsening of condition such as: Signs/symptoms of infection, increase in size, development of or worsening of odor, development of pain, or increased pain at the affected locations.   Notify the medical team if any of these develop.  Thank you for the consult. WOC nurse will not follow at this time.   Please re-consult the WOC team if needed.  Renaldo Reel Katrinka Blazing, MSN, RN,  CMSRN, Angus Seller, [facility] Wound Treatment Associate Pager [phone]

## 2022-03-24 DIAGNOSIS — T8781 Dehiscence of amputation stump: Secondary | ICD-10-CM | POA: Diagnosis not present

## 2022-03-24 DIAGNOSIS — E43 Unspecified severe protein-calorie malnutrition: Secondary | ICD-10-CM | POA: Diagnosis not present

## 2022-03-24 DIAGNOSIS — E46 Unspecified protein-calorie malnutrition: Secondary | ICD-10-CM

## 2022-03-24 DIAGNOSIS — N1832 Chronic kidney disease, stage 3b: Secondary | ICD-10-CM | POA: Diagnosis not present

## 2022-03-24 DIAGNOSIS — S31000A Unspecified open wound of lower back and pelvis without penetration into retroperitoneum, initial encounter: Secondary | ICD-10-CM

## 2022-03-24 DIAGNOSIS — N179 Acute kidney failure, unspecified: Secondary | ICD-10-CM | POA: Diagnosis not present

## 2022-03-24 DIAGNOSIS — T8789 Other complications of amputation stump: Secondary | ICD-10-CM | POA: Diagnosis not present

## 2022-03-24 DIAGNOSIS — E872 Acidosis, unspecified: Secondary | ICD-10-CM | POA: Diagnosis not present

## 2022-03-24 LAB — CBC
HCT: 24.8 % — ABNORMAL LOW (ref 36.0–46.0)
Hemoglobin: 7.4 g/dL — ABNORMAL LOW (ref 12.0–15.0)
MCH: 26.5 pg (ref 26.0–34.0)
MCHC: 29.8 g/dL — ABNORMAL LOW (ref 30.0–36.0)
MCV: 88.9 fL (ref 80.0–100.0)
Platelets: 324 10*3/uL (ref 150–400)
RBC: 2.79 MIL/uL — ABNORMAL LOW (ref 3.87–5.11)
RDW: 16.1 % — ABNORMAL HIGH (ref 11.5–15.5)
WBC: 6.7 10*3/uL (ref 4.0–10.5)
nRBC: 0 % (ref 0.0–0.2)

## 2022-03-24 LAB — GLUCOSE, CAPILLARY
Glucose-Capillary: 85 mg/dL (ref 70–99)
Glucose-Capillary: 66 mg/dL — ABNORMAL LOW (ref 70–99)
Glucose-Capillary: 156 mg/dL — ABNORMAL HIGH (ref 70–99)
Glucose-Capillary: 132 mg/dL — ABNORMAL HIGH (ref 70–99)
Glucose-Capillary: 106 mg/dL — ABNORMAL HIGH (ref 70–99)
Glucose-Capillary: 97 mg/dL (ref 70–99)
Glucose-Capillary: 139 mg/dL — ABNORMAL HIGH (ref 70–99)
Glucose-Capillary: 143 mg/dL — ABNORMAL HIGH (ref 70–99)
Glucose-Capillary: 120 mg/dL — ABNORMAL HIGH (ref 70–99)
Glucose-Capillary: 91 mg/dL (ref 70–99)
Glucose-Capillary: 111 mg/dL — ABNORMAL HIGH (ref 70–99)

## 2022-03-24 LAB — BASIC METABOLIC PANEL
Anion gap: 7 (ref 5–15)
BUN: 43 mg/dL — ABNORMAL HIGH (ref 6–20)
CO2: 18 mmol/L — ABNORMAL LOW (ref 22–32)
Calcium: 9.5 mg/dL (ref 8.9–10.3)
Chloride: 117 mmol/L — ABNORMAL HIGH (ref 98–111)
Creatinine, Ser: 2.3 mg/dL — ABNORMAL HIGH (ref 0.44–1.00)
GFR, Estimated: 24 mL/min — ABNORMAL LOW (ref >60)
Glucose, Bld: 73 mg/dL (ref 70–99)
Potassium: 3.9 mmol/L (ref 3.5–5.1)
Sodium: 142 mmol/L (ref 135–145)

## 2022-03-24 LAB — CORTISOL-AM, BLOOD: Cortisol - AM: 15.6 ug/dL (ref 6.7–22.6)

## 2022-03-24 MED ORDER — DEXTROSE 50 % IV SOLN
50.0000 mL | INTRAVENOUS | Status: AC
  Administered 2022-03-24: 50 mL via INTRAVENOUS
  Filled 2022-03-24: qty 50

## 2022-03-24 NOTE — Consult Note (Signed)
ORTHOPAEDIC CONSULTATION  REQUESTING PHYSICIAN: Inez Catalina, MD Necrotic ulceration Chief Complaint: Necrotic ulceration right below-knee amputation.  HPI: [name] is a 61 y.o. 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  Patient is status post bilateral transtibial amputations and most recently status post surgical debridement for a sacral decubitus ulcer in June.  Left below-knee amputation 5 years ago and right below-knee amputation 7 years ago.  Past Medical History:  Diagnosis Date   Anxiety    Cellulitis    Chronic diastolic (congestive) heart failure (HCC)    Complication of anesthesia 01/2013   "didn't know where I was; who I was; talking out the top of my head" (04/21/2013)   Concussion as a teenager   slight   COPD (chronic obstructive pulmonary disease) (HCC)    wear oxygen at home   Depression    Diabetic neuropathy (HCC)    GERD (gastroesophageal reflux disease)    Hyperlipidemia    Hypertension    Hypothyroidism    takes Synthroid daily   MRSA (methicillin resistant staph aureus) culture positive    hx of 2012   Neuromuscular disorder (HCC)    OSA on CPAP    and oxygen   Peripheral vascular disease (HCC)    Staph infection 2007   Symptomatic bradycardia 03/04/2014   TIA (transient ischemic attack)    Type II diabetes mellitus (HCC)    Past Surgical History:  Procedure Laterality Date   AMPUTATION Left 06/22/2017   Procedure: LEFT BELOW KNEE AMPUTATION;  Surgeon: Nadara Mustard, MD;  Location: [facility] OR;  Service: Orthopedics;  Laterality: Left;   BLADDER SURGERY  1970's   "stretched the mouth of my bladder" (04/21/2013)   DILATION AND CURETTAGE OF UTERUS  1980's   FOOT SURGERY Right 2012   "put pins in one year; amputated toes another OR" (04/21/2013)   GANGLION CYST EXCISION Left 1990's   "wrist" (04/21/2013)   I & D EXTREMITY Left 08/25/2013   Procedure: IRRIGATION AND DEBRIDEMENT EXTREMITY;  Surgeon:  Axel Filler, MD;  Location: [facility] OR;  Service: General;  Laterality: Left;   INCISION AND DRAINAGE ABSCESS N/A 01/24/2013   Procedure: INCISION AND DRAINAGE ABSCESS;  Surgeon: Clovis Pu. Cornett, MD;  Location: [facility] OR;  Service: General;  Laterality: N/A;   INCISION AND DRAINAGE ABSCESS Left 02/06/2022   Procedure: INCISION AND DRAINAGE ABSCESS;  Surgeon: Emelia Loron, MD;  Location: [facility];  Service: General;  Laterality: Left;   INCISION AND DRAINAGE ABSCESS N/A 02/08/2022   Procedure: INCISION AND DRAINAGE ABSCESS PERINEUM;  Surgeon: Emelia Loron, MD;  Location: [facility];  Service: General;  Laterality: N/A;   INGUINAL HERNIA REPAIR N/A 01/28/2013   Procedure: I&D Right Groin Wound;  Surgeon: Shelly Rubenstein, MD;  Location: [facility];  Service: General;  Laterality: N/A;   LEG AMPUTATION BELOW KNEE Right 2012   PERMANENT PACEMAKER INSERTION N/A 03/05/2014   Biotronik dual chamber pacemaker implanted by Dr Graciela Husbands for symptomatic bradycardia   REFRACTIVE SURGERY Bilateral 2014   STUMP REVISION Right 12/11/2014   Procedure: Right Below Knee Amputation Revision;  Surgeon: Nadara Mustard, MD;  Location: [facility] OR;  Service: Orthopedics;  Laterality: Right;   TOE AMPUTATION Left ?2011    only 2 toes remaning.    TUBAL LIGATION  1990's   WRIST SURGERY Right 1980's   "pinched nerve" (04/21/2013)   Social History   Socioeconomic History   Marital status: Single    Spouse  name: Not on file   Number of children: Not on file   Years of education: Not on file   Highest education level: Not on file  Occupational History   Not on file  Tobacco Use   Smoking status: Every Day    Packs/day: 0.50    Years: 33.00    Total pack years: 16.50    Types: Cigarettes   Smokeless tobacco: Never  Vaping Use   Vaping Use: Never used  Substance and Sexual Activity   Alcohol use: No   Drug use: No   Sexual activity: Never  Other Topics Concern   Not on file  Social History Narrative   Not on file    Social Determinants of Health   Financial Resource Strain: Not on file  Food Insecurity: Not on file  Transportation Needs: Not on file  Physical Activity: Not on file  Stress: Not on file  Social Connections: Not on file   Family History  Problem Relation Age of Onset   Hypertension Mother    Hyperlipidemia Mother    Hypertrophic cardiomyopathy Mother        has pacer   Alzheimer's disease Father    Hypertension Brother    Hyperlipidemia Brother    Cancer Maternal Aunt    Diabetes Paternal Aunt    Diabetes Paternal Uncle    Diabetes Paternal Grandmother    Anesthesia problems Neg Hx    Hypotension Neg Hx    Malignant hyperthermia Neg Hx    Pseudochol deficiency Neg Hx    - negative except otherwise stated in the family history section Allergies  Allergen Reactions   Penicillins Hives    Has patient had a PCN reaction causing immediate rash, facial/tongue/throat swelling, SOB or lightheadedness with hypotension: No Has patient had a PCN reaction causing severe rash involving mucus membranes or skin necrosis: No Has patient had a PCN reaction that required hospitalization No Has patient had a PCN reaction occurring within the last 10 years: No If all of the above answers are "NO", then may proceed with Cephalosporin use.    Dulaglutide Diarrhea    Trulicity- "Allergic," per MAR   Tositumomab Other (See Comments)    "Allergic," per MAR   Erythromycin Diarrhea and Other (See Comments)    "Allergic," per MAR   Gadolinium Nausea And Vomiting and Other (See Comments)     Code: VOM, Desc: Pt began vomiting immed post infusion of multihance, Onset Date: [id number] "Allergic," per MAR    Iodine-131 Nausea And Vomiting and Other (See Comments)    "Allergic," per MAR   Ivp Dye [Iodinated Contrast Media] Nausea And Vomiting and Other (See Comments)    "Allergic," per MAR    Metformin Diarrhea and Other (See Comments)    "Allergic," per [facility]   Prior to Admission medications    Medication Sig Start Date End Date Taking? Authorizing Provider  acetaminophen (TYLENOL) 500 MG tablet Take 2 tablets (1,000 mg total) by mouth every 6 (six) hours as needed for moderate pain. Patient taking differently: Take 1,000 mg by mouth every 6 (six) hours as needed (for pain). 02/13/22  Yes Osvaldo Shipper, MD  Amino Acids-Protein Hydrolys (FEEDING SUPPLEMENT, PRO-STAT SUGAR FREE 64,) LIQD Take 30 mLs by mouth 2 (two) times daily.   Yes [provider]  ascorbic acid (VITAMIN C) 500 MG tablet Take 1 tablet (500 mg total) by mouth 2 (two) times daily. Patient taking differently: Take 500 mg by mouth daily. 02/14/22  Yes Rito Ehrlich,  Gokul, MD  carvedilol (COREG) 3.125 MG tablet Take 1 tablet (3.125 mg total) by mouth 2 (two) times daily with a meal. 10/25/15  Yes Rolly Salter, MD  clopidogrel (PLAVIX) 75 MG tablet Take 1 tablet (75 mg total) by mouth daily. Patient taking differently: Take 75 mg by mouth every morning. 07/24/16  Yes Ghimire, Werner Lean, MD  Cyanocobalamin (B-12) 1000 MCG TABS Take 1,000 mcg by mouth every morning. 01/22/22  Yes [provider]  FARXIGA 10 MG TABS tablet Take 10 mg by mouth every morning. 01/24/22  Yes [provider]  fenofibrate 160 MG tablet Take 1 tablet (160 mg total) by mouth daily. 07/24/16  Yes Ghimire, Werner Lean, MD  FLUoxetine (PROZAC) 10 MG capsule Take 10 mg by mouth every morning.   Yes [provider]  glimepiride (AMARYL) 4 MG tablet Take 4 mg by mouth 2 (two) times daily with a meal. 01/22/22  Yes [provider]  Insulin Glargine (BASAGLAR KWIKPEN) 100 UNIT/ML Inject 20 Units into the skin daily.   Yes [provider]  levothyroxine (SYNTHROID, LEVOTHROID) 50 MCG tablet Take 50 mcg by mouth daily before breakfast.   Yes [provider]  lisinopril (ZESTRIL) 2.5 MG tablet Take 2.5 mg by mouth every morning. 01/22/22  Yes [provider]  metoCLOPramide (REGLAN) 10 MG tablet Take 10 mg  by mouth 4 (four) times daily -  before meals and at bedtime. 11/25/15  Yes [provider]  mirtazapine (REMERON) 7.5 MG tablet Take 7.5 mg by mouth at bedtime. 03/15/22  Yes [provider]  Multiple Vitamin (MULTIVITAMIN WITH MINERALS) TABS tablet Take 1 tablet by mouth daily. Patient taking differently: Take 1 tablet by mouth daily with breakfast. 02/15/22  Yes Osvaldo Shipper, MD  nutrition supplement, JUVEN, (JUVEN) PACK Take 1 packet by mouth 2 (two) times daily between meals. 02/14/22  Yes Osvaldo Shipper, MD  oxybutynin (DITROPAN-XL) 10 MG 24 hr tablet Take 10 mg by mouth daily. 03/13/22  Yes [provider]  oxyCODONE (OXY IR/ROXICODONE) 5 MG immediate release tablet Take 1-2 tablets (5-10 mg total) by mouth every 4 (four) hours as needed for severe pain. Patient taking differently: Take 5 mg by mouth every 4 (four) hours as needed (for pain). 02/13/22  Yes Osvaldo Shipper, MD  pantoprazole (PROTONIX) 40 MG tablet Take 40 mg by mouth daily. 09/23/18  Yes [provider]  rosuvastatin (CRESTOR) 40 MG tablet Take 40 mg by mouth every morning.   Yes [provider]  saccharomyces boulardii (FLORASTOR) 250 MG capsule Take 250 mg by mouth 2 (two) times daily.   Yes [provider]  collagenase (SANTYL) 250 UNIT/GM ointment Apply topically 2 (two) times daily. Apply to R buttock area of eschar and cover with WTD dressing BID Patient not taking: Reported on 03/23/2022 02/13/22   Osvaldo Shipper, MD  EASY COMFORT LANCETS MISC test blood sugar THREE TIMES DAILY 09/23/16   [provider]  feeding supplement (ENSURE ENLIVE / ENSURE PLUS) LIQD Take 237 mLs by mouth 3 (three) times daily between meals. Patient not taking: Reported on 03/23/2022 02/14/22   Osvaldo Shipper, MD  insulin degludec (TRESIBA) 100 UNIT/ML FlexTouch Pen Inject 20 Units into the skin daily. Patient not taking: Reported on 03/23/2022 02/13/22   Osvaldo Shipper, MD  leptospermum manuka  honey (MEDIHONEY) PSTE paste Apply 1 Application topically daily. Apply thin layer (3 mm) to wound. Patient not taking: Reported on 03/23/2022 02/13/22   Osvaldo Shipper, MD  zinc  sulfate 220 (50 Zn) MG capsule Take 1 capsule (220 mg total) by mouth daily. Patient not taking: Reported on 03/23/2022 02/15/22   Osvaldo Shipper, MD   DG Tibia/Fibula Right  Result Date: 03/22/2022 CLINICAL DATA:  Leg wound. EXAM: RIGHT TIBIA AND FIBULA - 2 VIEW COMPARISON:  None Available. FINDINGS: There has been prior below-the-knee amputation. There is soft tissue ulceration and air overlying the distal aspect of the fibula and lateral distal aspect of the tibia. Bony areas appear exposed in this region. There is no periosteal reaction identified. No erosive changes are seen. No acute fracture or dislocation. IMPRESSION: 1. Prior below-knee amputation. There is soft tissue ulceration in bony exposure overlying the distal tibia and fibula. Osteomyelitis not excluded. No cortical erosion or periosteal reaction. Electronically Signed   By: Darliss Cheney M.D.   On: 03/22/2022 21:00   US RENAL  Result Date: 03/22/2022 CLINICAL DATA:  Acute kidney injury. EXAM: RENAL / URINARY TRACT ULTRASOUND COMPLETE COMPARISON:  CT 03/22/2022 FINDINGS: Right Kidney: Renal measurements: 11.6 x 4.8 x 5.3 cm = volume: 154 mL. Echogenicity within normal limits. No mass or hydronephrosis visualized. Left Kidney: Renal measurements: 10.4 x 5.7 x 5.5 cm = volume: 170 mL. Echogenicity within normal limits. No mass or hydronephrosis visualized. Bladder: Bladder is not visualized, likely decompressed. Other: None. IMPRESSION: Normal ultrasound appearance of the kidneys.  No hydronephrosis. Electronically Signed   By: Burman Nieves M.D.   On: 03/22/2022 20:04   CT ABDOMEN PELVIS WO CONTRAST  Result Date: 03/22/2022 CLINICAL DATA:  Sepsis. EXAM: CT ABDOMEN AND PELVIS WITHOUT CONTRAST TECHNIQUE: Multidetector CT imaging of the abdomen and pelvis was  performed following the standard protocol without IV contrast. RADIATION DOSE REDUCTION: This exam was performed according to the departmental dose-optimization program which includes automated exposure control, adjustment of the mA and/or kV according to patient size and/or use of iterative reconstruction technique. COMPARISON:  February 05, 2022.  October 16, 2018. FINDINGS: Lower chest: No acute abnormality. Hepatobiliary: No focal liver abnormality is seen. No gallstones, gallbladder wall thickening, or biliary dilatation. Pancreas: Unremarkable. No pancreatic ductal dilatation or surrounding inflammatory changes. Spleen: Normal in size without focal abnormality. Adrenals/Urinary Tract: Adrenal glands and kidneys are unremarkable. No hydronephrosis or renal obstruction is noted. Urinary bladder is decompressed secondary to Foley catheter. Stomach/Bowel: Stomach is within normal limits. Appendix appears normal. No evidence of bowel wall thickening, distention, or inflammatory changes. Vascular/Lymphatic: Aortic atherosclerosis. No enlarged abdominal or pelvic lymph nodes. Reproductive: Uterus and bilateral adnexa are unremarkable. Other: No abdominal wall hernia or abnormality. No abdominopelvic ascites. Musculoskeletal: No acute or significant osseous findings. IMPRESSION: No acute abnormality seen in the abdomen or pelvis. Aortic Atherosclerosis (ICD10-I70.0). Electronically Signed   By: Lupita Raider M.D.   On: 03/22/2022 16:09   DG Chest Port 1 View  Result Date: 03/22/2022 CLINICAL DATA:  Sepsis. EXAM: PORTABLE CHEST 1 VIEW COMPARISON:  10/18/2015 chest x-ray and 12/09/2018 through the CT scan. FINDINGS: The cardiac silhouette, mediastinal and hilar contours are within normal limits and stable. The pacer wires are stable. The lungs are clear of an acute process. No pleural effusions or pulmonary lesions. No pneumothorax. The bony thorax is intact. IMPRESSION: No acute cardiopulmonary findings.  Electronically Signed   By: Rudie Meyer M.D.   On: 03/22/2022 14:41   - pertinent xrays, CT, MRI studies were reviewed and independently interpreted  Positive ROS: All other systems have been reviewed and were otherwise negative with the exception of those mentioned  in the HPI and as above.  Physical Exam: General: Alert, no acute distress Psychiatric: Patient is competent for consent with normal mood and affect Lymphatic: No axillary or cervical lymphadenopathy Cardiovascular: No pedal edema Respiratory: No cyanosis, no use of accessory musculature GI: No organomegaly, abdomen is soft and non-tender    Images:  @  Labs:  Lab Results  Component Value Date   HGBA1C 12.6 (H) 02/06/2022   HGBA1C 14.9 (H) 10/17/2018   HGBA1C >15.5 (H) 06/18/2017   ESRSEDRATE >140 (H) 12/05/2015   ESRSEDRATE 119 (H) 10/21/2015   ESRSEDRATE 70 (H) 12/07/2014   CRP 22.8 (H) 12/05/2015   CRP 3.4 (H) 10/21/2015   CRP 27.6 (H) 12/07/2014   REPTSTATUS PENDING 03/22/2022   GRAMSTAIN  02/06/2022    FEW WBC PRESENT, PREDOMINANTLY PMN ABUNDANT GRAM POSITIVE RODS    CULT  03/22/2022    NO GROWTH < 24 HOURS Performed at [facility], [address] [place], [place] [address]    LABORGA PROTEUS MIRABILIS 02/06/2022   LABORGA STAPHYLOCOCCUS AUREUS 02/06/2022    Lab Results  Component Value Date   ALBUMIN 1.9 (L) 03/23/2022   ALBUMIN 2.4 (L) 03/22/2022   ALBUMIN 2.2 (L) 02/06/2022   PREALBUMIN 10.4 (L) 12/05/2015   PREALBUMIN 18.3 10/20/2015        Latest Ref Rng & Units 03/24/2022    4:46 AM 03/23/2022    4:58 AM 03/22/2022    1:40 PM  CBC EXTENDED  WBC 4.0 - 10.5 K/uL 6.7  8.6  11.2   RBC 3.87 - 5.11 MIL/uL 2.79  2.74  3.39   Hemoglobin 12.0 - 15.0 g/dL 7.4  7.3  9.1   HCT 60.4 - 46.0 % 24.8  24.8  30.1   Platelets 150 - 400 K/uL 324  318  398   NEUT# 1.7 - 7.7 K/uL  5.0  5.2   Lymph# 0.7 - 4.0 K/uL  2.8  5.0     Neurologic: Patient does not have protective sensation  bilateral lower extremities.   MUSCULOSKELETAL:   Skin: Emanation patient has mild abrasions over the patella bilaterally no ulceration over the left transtibial amputation.  Right transtibial amputation has exposed tibia with the necrotic wound.  There is no ascending cellulitis.  Admission white cell count 11.2 current white cell count 6.7, hemoglobin 7.4  Albumin 1.9.  Hemoglobin A1c  12.6, historically 14.9 and greater than 15.5.  Assessment: Assessment: Uncontrolled type 2 diabetes with severe protein caloric malnutrition with dehiscence and osteomyelitis of the right transtibial amputation.  Plan: Plan: Discussed with the patient the recommended treatment option would be to proceed with an above-the-knee amputation on the right.  Patient states she does not want to consider surgery for the right leg.  With patient's nutritional status and glucose status she would have difficulty healing an above-the-knee amputation.  Recommend packing the wound open and changing the dressing daily.  I will follow-up as needed.  Thank you for the consult and the opportunity to see Ms. Owens Shark, MD [facility] [phone] 10:12 AM

## 2022-03-24 NOTE — Progress Notes (Addendum)
HD#2 Subjective:   Summary: [name] reports feeling better compared to yesterday. Still has pain from sacral wound that is unchanged. Denies chest pain, SOB, nausea or vomiting. Mental status was alert but oriented to self and time, not place. Not having much appetite and only drank one nutritional shake yesterday.   Overnight Events: MAP dropped to 63 last night, 300cc LR bolus given with improvement. CBG dropped to 66 overnight, 50cc D50 given with improvement.   Objective:  Vital signs in last 24 hours: Vitals:   03/23/22 1817 03/23/22 2011 03/23/22 2310 03/24/22 0317  BP: (!) 102/50 (!) 107/53 (!) 124/54 131/66  Pulse: 86 69 77 77  Resp: (!) 9 10 20 18   Temp:  (!) 97.5 F (36.4 C) (!) 97.5 F (36.4 C) 98.7 F (37.1 C)  TempSrc:  Oral Oral Oral  SpO2: 97% 95% 99%   Weight:      Height:         Physical Exam:  Constitutional: sleeping in bed but arousable, in no acute distress HENT: normocephalic atraumatic Neck: supple Cardiovascular: regular rate and rhythm Extremities: Bilateral BKA, right BKA residual limb has ulcerated wound with clean bandage Neurological: alert, oriented to self and time, not place Skin: warm and dry Psych: Normal mood and behavior  Filed Weights   03/22/22 1337 03/22/22 1915  Weight: 85.5 kg 85 kg     Intake/Output Summary (Last 24 hours) at 03/24/2022 1142 Last data filed at 03/24/2022 0300 Gross per 24 hour  Intake 1364.39 ml  Output 2550 ml  Net -1185.61 ml   Net IO Since Admission: -2,485.61 mL [03/24/22 1142]  Pertinent Labs:    Latest Ref Rng & Units 03/24/2022    4:46 AM 03/23/2022    4:58 AM 03/22/2022    1:40 PM  CBC  WBC 4.0 - 10.5 K/uL 6.7  8.6  11.2   Hemoglobin 12.0 - 15.0 g/dL 7.4  7.3  9.1   Hematocrit 36.0 - 46.0 % 24.8  24.8  30.1   Platelets 150 - 400 K/uL 324  318  398        Latest Ref Rng & Units 03/24/2022    4:46 AM 03/23/2022    4:58 AM 03/22/2022    1:40 PM  CMP  Glucose 70 - 99 mg/dL 73  42  05/22/2022   BUN  6 - 20 mg/dL 43  68  80   Creatinine 0.44 - 1.00 mg/dL 694  8.54  6.27   Sodium 135 - 145 mmol/L 142  142  138   Potassium 3.5 - 5.1 mmol/L 3.9  4.1  5.2   Chloride 98 - 111 mmol/L 117  113  103   CO2 22 - 32 mmol/L 18  17  19    Calcium 8.9 - 10.3 mg/dL 9.5  0.35    Total Protein 6.5 - 8.1 g/dL  6.2  7.8   Total Bilirubin 0.3 - 1.2 mg/dL  0.8  0.8   Alkaline Phos 38 - 126 U/L  109  126   AST 15 - 41 U/L  23  22   ALT 0 - 44 U/L  15  16     Imaging: No results found.  Assessment/Plan:   Principal Problem:   AKI (acute kidney injury) (HCC) Active Problems:   Uncontrolled type 2 diabetes mellitus with hyperglycemia (HCC)   Dehiscence of amputation stump of right lower extremity (HCC)   Sacral wound   Severe protein-calorie malnutrition (HCC)  Patient Summary: [name] is a 61 y.o. with a pertinent PMH of HFpEF, COPD, GERD, HLD, HTN, hypothyroidism, OSA on CPAP, PVD, sinus bradycardia s/p pacemaker, and insulin-dependent type 2 diabetes who presented with hypotension and admitted for concern for sepsis and AKI.    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 Right LE ulceration with bony exposure does not appear infected. Xray with evidence of soft tissue ulceration unable to rule out osteomyelitis. Unable to obtain MRI with history of pacemaker. Do not suspect UTI, UA likely colonization from her chronic foley catheter. Ortho saw her this morning and recommended right AKA.  However, given her nutritional deficiency and uncontrolled diabetes, she would have poor wound healing. Opted for wound packing and dressing change daily for R LE. Appreciate Ortho consult. -Continue vancomycin and cefepime, day 3 -follow BC x2 (no growth <24 hours) -Wound care and dressing change daily  AKI on CKD 3B Anion Gap Metabolic Acidosis Hyperkalemia Chronic Foley catheter Baseline  creatinine of 1.5 from prior hospitalization.  Creatinine continues improving, 2.3.  Lactic acid normal.  UA W/O casts, RBC.  Proteinuria of 100.  Large WBC and rare bacteria, suspect colonization with chronic Foley catheter.  Facility states they change weekly.  Renal US normal. Likely consider prerenal etiology from hypotension and poor p.o. intake. Anion Gap resolved. Potassium is now stable.  -daily BMP -strict I/O -Continue foley care -if worsening renal function, consider nephrology referral  Chronic sacral wound Sacral wound was debrided on prior hospitalization with antibiotic therapy.  Wound has improved since prior hospitalization. No evidence of acute infection.  Wound care consult appreciated. Considering her poor nutrition status and uncontrolled diabetes, postoperative recovery would be complicated. We will continue with wound care management.  -continue wound care management -continue home oxycodone and tylenol regiment for chronic pain  Malnutrition Continues to have low appetite. She reports drinking one nutritional shake yesterday. RD consult appreciated. Encouraged po intake. RD recommends if po intake does not improve then Cortrak placement.  -MVI -Ensure Enlive po BID -pending labs for zinc, vitamin C, vitamin A  Moderate hypercalcemia Improving, calcium is downtrending to normal. Asymptomatic.   HFpEF 2017 Echo showed EF of 65-70%. She is not hypervolemic on exam.  Hx of TIA Continue Plavix and aspirin.   Normocytic anemia Anemia of chronic disease in setting of multiple chronic wounds. Will continue to monitor.  Insulin-dependent type 2 diabetes mellitus Patient still having intermittent hypoglycemia. No recent insulin use, she was on sliding scale insulin but based on MAR she did not receive any.  This is discontinued. Poor po intake still. Concern for other etiologies for the hypoglycemia. AM cortisol level was normal. If she continues to have hypoglycemia,  consider ordering C-peptide to assess endogenous hyperinsulinism.  -CBG every 2 hours -consider C-peptide if she continues to have hypoglycemia  Hx of hypertension Hold lisinopril and carvedilol in setting of hypotension and AKI  Hypothyroidism TSH was 0.920 today. Will continue levothyroxine.  HLD Continue home rosuvastatin 40mg .  OSA CPAP qhs  GERD Continue Protonix 40 mg daily.  Diet: Normal IVF: None,None VTE: Enoxaparin Code: Full Family Update: arrived from [facility], sister and brother were present at admission   Dispo: Anticipated discharge to Skilled nursing facility pending rehydration and wound management.   [facility], DO Internal Medicine Resident PGY-1 Please contact the on call pager after 5 pm and on weekends at [phone].

## 2022-03-24 NOTE — Progress Notes (Signed)
MSW intern left voicemail from patient's brother, [name].

## 2022-03-24 NOTE — [place] FL2 (Signed)
[place] MEDICAID FL2 LEVEL OF CARE SCREENING TOOL     IDENTIFICATION  Patient Name: [name] Birthdate: Nov 03, 1960 Sex: female Admission Date (Current Location): 03/22/2022  [facility] and [place] Number:  Producer, television/film/video and Address:  [facility], [address] [place], [place] [address]      Provider Number: [id number]  Attending Physician Name and Address:  Inez Catalina, MD  Relative Name and Phone Number:       Current Level of Care: Hospital Recommended Level of Care: Skilled Nursing Facility Prior Approval Number:    Date Approved/Denied:   PASRR Number: [id number]  Discharge Plan: SNF    Current Diagnoses: Patient Active Problem List   Diagnosis Date Noted   Dehiscence of amputation stump of right lower extremity (HCC)    Sacral wound    Severe protein-calorie malnutrition (HCC)    Cellulitis of sacral region 02/05/2022   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   Cystitis 10/16/2018   History of left below knee amputation (HCC) 07/16/2017   Abnormal urinalysis 06/18/2017   Diabetic gastroparesis (HCC) 06/18/2017   History of TIA (transient ischemic attack) 06/18/2017   Hyperlipidemia 04/10/2017   Acquired absence of right leg below knee (HCC) 11/29/2016   Left facial numbness    Cerebrovascular disease    Diabetic polyneuropathy associated with type 2 diabetes mellitus (HCC)    Chronic nonintractable headache    GERD (gastroesophageal reflux disease) 07/21/2016   TIA (transient ischemic attack) 07/04/2016   Hyperkalemia 12/07/2015   Cellulitis in diabetic foot (HCC) 12/05/2015   Herpes simplex labialis    Altered mental status    Thrush 10/20/2015   HSV-1 (herpes simplex virus 1) infection    Hallucinations    Acute respiratory failure with hypoxia (HCC) 10/14/2015   Hyperglycemia 10/13/2015   Diabetes mellitus due to underlying condition with hyperosmolarity without nonketotic  hyperglycemic-hyperosmolar coma [facility]) (HCC) 10/13/2015   Stage 3b chronic kidney disease (CKD) (HCC) 10/13/2015   AKI (acute kidney injury) (HCC) 10/13/2015   Diastolic CHF (HCC) 10/13/2015   Acute encephalopathy 10/13/2015   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   Lactic acid acidosis    Diabetic foot ulcer (HCC) 12/08/2014   Symptomatic bradycardia 03/04/2014   OSA on CPAP with oxygen 03/04/2014   COPD (chronic obstructive pulmonary disease), on home O2 03/04/2014   Bradycardia 03/04/2014   Cellulitis 08/16/2013   Chronic diastolic heart failure (HCC) 08/16/2013   Sepsis (HCC) 08/16/2013   SOB (shortness of breath) 04/20/2013   Proteinuria 01/26/2013   Acute on chronic kidney failure (HCC) 01/24/2013   Abscess of groin, right 01/23/2013   HTN (hypertension) 01/23/2013   Leukocytosis 01/23/2013   Anemia of chronic disease 01/23/2013   Hypothyroidism 01/23/2013   Hyponatremia 10/06/2011   HYPERCHOLESTEROLEMIA 08/17/2010   Depression 02/01/2010   TOBACCO ABUSE 06/04/2009   Uncontrolled type 2 diabetes mellitus with hyperglycemia (HCC) 08/06/2007    Orientation RESPIRATION BLADDER Height & Weight     Self  Normal Incontinent, External catheter Weight: 187 lb 6.3 oz (85 kg) Height:  5\' 5"  (165.1 cm)  BEHAVIORAL SYMPTOMS/MOOD NEUROLOGICAL BOWEL NUTRITION STATUS      Continent Diet (See discharge summary.)  AMBULATORY STATUS COMMUNICATION OF NEEDS Skin   Extensive Assist Verbally Normal                       Personal Care Assistance Level of Assistance  Feeding, Bathing, Dressing Bathing Assistance: Maximum assistance  Feeding assistance: Maximum assistance Dressing Assistance: Maximum assistance     Functional Limitations Info  Sight, Hearing Sight Info: Adequate Hearing Info: Adequate      SPECIAL CARE FACTORS FREQUENCY  PT (By licensed PT), OT (By licensed OT)     PT Frequency: 5x/week OT Frequency: 5x/week            Contractures  Contractures Info: Not present    Additional Factors Info  Code Status, Allergies Code Status Info: Full Code status. Allergies Info: Penicillins, Dulaglutide, Tositumomab, Erythromycin, Gadolinium, Iodine-131, Ivp Dye, Metformin           Current Medications (03/24/2022):  This is the current hospital active medication list Current Facility-Administered Medications  Medication Dose Route Frequency Provider Last Rate Last Admin   acetaminophen (TYLENOL) tablet 1,000 mg  1,000 mg Oral Q6H PRN Katsadouros, Vasilios, MD   1,000 mg at 03/23/22 1011   ceFEPIme (MAXIPIME) 2 g in sodium chloride 0.9 % 100 mL IVPB  2 g Intravenous Q24H Daylene Posey, RPH 200 mL/hr at 03/24/22 1524 2 g at 03/24/22 1524   clopidogrel (PLAVIX) tablet 75 mg  75 mg Oral q morning Katsadouros, Vasilios, MD   75 mg at 03/24/22 1011   enoxaparin (LOVENOX) injection 30 mg  30 mg Subcutaneous Q24H Katsadouros, Vasilios, MD   30 mg at 03/23/22 2136   feeding supplement (ENSURE ENLIVE / ENSURE PLUS) liquid 237 mL  237 mL Oral BID BM Debe Coder B, MD   237 mL at 03/24/22 1012   levothyroxine (SYNTHROID) tablet 50 mcg  50 mcg Oral Q0600 Belva Agee, MD   50 mcg at 03/24/22 0457   multivitamin with minerals tablet 1 tablet  1 tablet Oral Daily Debe Coder B, MD   1 tablet at 03/24/22 1011   oxyCODONE (Oxy IR/ROXICODONE) immediate release tablet 5-10 mg  5-10 mg Oral Q4H PRN Katsadouros, Vasilios, MD   5 mg at 03/24/22 1427   pantoprazole (PROTONIX) EC tablet 40 mg  40 mg Oral q morning Katsadouros, Vasilios, MD   40 mg at 03/24/22 1011   rosuvastatin (CRESTOR) tablet 40 mg  40 mg Oral Daily Rana Snare, DO   40 mg at 03/24/22 1011   vancomycin variable dose per unstable renal function (pharmacist dosing)   Does not apply See admin instructions Daylene Posey, [facility]         Discharge Medications: Please see discharge summary for a list of discharge medications.  Relevant Imaging Results:  Relevant Lab  Results:   Additional Information SSN: [id number]  Mearl Latin, LCSW

## 2022-03-24 NOTE — TOC Initial Note (Signed)
Transition of Care [facility]) - Initial/Assessment Note    Patient Details  Name: [name] MRN: [id number] Date of Birth: [birth year]  [facility]) CM/SW Contact:    Dale Bradley, Student-Social Work Phone Number: 03/24/2022, 3:55 PM  Clinical Narrative:                 MSW intern received return phone call from patient's brother, [name]. MSW intern advised she was working on the discharge plan for Kylyn and right now the plan was for her to return to [facility]. [name] agreed to this plan.   Expected Discharge Plan: Skilled Nursing Facility Barriers to Discharge: SNF Pending bed offer   Patient Goals and CMS Choice   CMS Medicare.gov Compare Post Acute Care list provided to:: Patient Choice offered to / list presented to : Sibling  Expected Discharge Plan and Services Expected Discharge Plan: Skilled Nursing Facility                                              Prior Living Arrangements/Services   Lives with:: Self, Siblings Patient language and need for interpreter reviewed:: Yes Do you feel safe going back to the place where you live?: Yes      Need for Family Participation in Patient Care: Yes (Comment) Care giver support system in place?: Yes (comment)   Criminal Activity/Legal Involvement Pertinent to Current Situation/Hospitalization: No - Comment as needed  Activities of Daily Living   ADL Screening (condition at time of admission) Patient's cognitive ability adequate to safely complete daily activities?: Yes Is the patient deaf or have difficulty hearing?: No Does the patient have difficulty seeing, even when wearing glasses/contacts?: No Does the patient have difficulty concentrating, remembering, or making decisions?: Yes Patient able to express need for assistance with ADLs?: Yes Does the patient have difficulty dressing or bathing?: Yes Independently performs ADLs?: No Communication: Independent Dressing (OT):  Dependent Grooming: Dependent Feeding: Dependent Bathing: Dependent Toileting: Dependent In/Out Bed: Dependent Walks in Home: Dependent Does the patient have difficulty walking or climbing stairs?: Yes Weakness of Arms/Hands: Both  Permission Sought/Granted Permission sought to share information with : Facility Marketing executive granted to share info w Relationship: Brother  Permission granted to share info w Contact Information: [name]  Emotional Assessment Appearance:: Appears stated age Attitude/Demeanor/Rapport: Engaged Affect (typically observed): Accepting Orientation: : Oriented to Self Alcohol / Substance Use: Not Applicable Psych Involvement: No (comment)  Admission diagnosis:  AKI (acute kidney injury) (HCC) [N17.9] Wound of sacral region, initial encounter [S31.000A] Hypotension, unspecified hypotension type [I95.9] Patient Active Problem List   Diagnosis Date Noted   Dehiscence of amputation stump of right lower extremity (HCC)    Sacral wound    Severe protein-calorie malnutrition (HCC)    Cellulitis of sacral region 02/05/2022   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   Cystitis 10/16/2018   History of left below knee amputation (HCC) 07/16/2017   Abnormal urinalysis 06/18/2017   Diabetic gastroparesis (HCC) 06/18/2017   History of TIA (transient ischemic attack) 06/18/2017   Hyperlipidemia 04/10/2017   Acquired absence of right leg below knee (HCC) 11/29/2016   Left facial numbness    Cerebrovascular disease    Diabetic polyneuropathy associated with type 2 diabetes mellitus (HCC)    Chronic nonintractable headache  GERD (gastroesophageal reflux disease) 07/21/2016   TIA (transient ischemic attack) 07/04/2016   Hyperkalemia 12/07/2015   Cellulitis in diabetic foot (HCC) 12/05/2015   Herpes simplex labialis    Altered mental status    Thrush 10/20/2015   HSV-1 (herpes simplex virus 1) infection     Hallucinations    Acute respiratory failure with hypoxia (HCC) 10/14/2015   Hyperglycemia 10/13/2015   Diabetes mellitus due to underlying condition with hyperosmolarity without nonketotic hyperglycemic-hyperosmolar coma [facility]) (HCC) 10/13/2015   Stage 3b chronic kidney disease (CKD) (HCC) 10/13/2015   AKI (acute kidney injury) (HCC) 10/13/2015   Diastolic CHF (HCC) 10/13/2015   Acute encephalopathy 10/13/2015   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   Lactic acid acidosis    Diabetic foot ulcer (HCC) 12/08/2014   Symptomatic bradycardia 03/04/2014   OSA on CPAP with oxygen 03/04/2014   COPD (chronic obstructive pulmonary disease), on home O2 03/04/2014   Bradycardia 03/04/2014   Cellulitis 08/16/2013   Chronic diastolic heart failure (HCC) 08/16/2013   Sepsis (HCC) 08/16/2013   SOB (shortness of breath) 04/20/2013   Proteinuria 01/26/2013   Acute on chronic kidney failure (HCC) 01/24/2013   Abscess of groin, right 01/23/2013   HTN (hypertension) 01/23/2013   Leukocytosis 01/23/2013   Anemia of chronic disease 01/23/2013   Hypothyroidism 01/23/2013   Hyponatremia 10/06/2011   HYPERCHOLESTEROLEMIA 08/17/2010   Depression 02/01/2010   TOBACCO ABUSE 06/04/2009   Uncontrolled type 2 diabetes mellitus with hyperglycemia (HCC) 08/06/2007   PCP:  Loyal Jacobson, MD Pharmacy:   [facility] [place], [place] - [address] [place] [address] Phone: [phone] Fax: [phone]  [facility] [place], [place] - [address] [place] [address] Phone: [phone] Fax: [phone]     Social Determinants of Health (SDOH) Interventions    Readmission Risk Interventions    02/15/2022    9:01 AM 02/07/2022   12:17 PM  Readmission Risk Prevention Plan  Transportation Screening Complete Complete  PCP or Specialist Appt within 5-7 Days  Complete  PCP or Specialist Appt within  3-5 Days Complete   Home Care Screening  Complete  Medication Review (RN CM)  Complete  HRI or Home Care Consult Complete   Social Work Consult for Recovery Care Planning/Counseling Complete   Palliative Care Screening Not Applicable   Medication Review [employer]) Complete

## 2022-03-24 NOTE — Care Management Important Message (Signed)
Important Message  Patient Details  Name: [name] MRN: [id number] Date of Birth: 1961-06-15   Medicare Important Message Given:  Yes     Joletta Manner Stefan Church 03/24/2022, 2:31 PM

## 2022-03-24 NOTE — Progress Notes (Signed)
Pt is now refusing to go on cpap machine tonight.

## 2022-03-24 NOTE — Progress Notes (Signed)
Pt agreed to go on cpap tonight at 2200.

## 2022-03-25 DIAGNOSIS — Z7189 Other specified counseling: Secondary | ICD-10-CM | POA: Diagnosis not present

## 2022-03-25 DIAGNOSIS — T8781 Dehiscence of amputation stump: Secondary | ICD-10-CM | POA: Diagnosis not present

## 2022-03-25 DIAGNOSIS — E1165 Type 2 diabetes mellitus with hyperglycemia: Secondary | ICD-10-CM

## 2022-03-25 DIAGNOSIS — S31000D Unspecified open wound of lower back and pelvis without penetration into retroperitoneum, subsequent encounter: Secondary | ICD-10-CM | POA: Diagnosis not present

## 2022-03-25 DIAGNOSIS — E46 Unspecified protein-calorie malnutrition: Secondary | ICD-10-CM | POA: Diagnosis not present

## 2022-03-25 DIAGNOSIS — N179 Acute kidney failure, unspecified: Secondary | ICD-10-CM | POA: Diagnosis not present

## 2022-03-25 DIAGNOSIS — E162 Hypoglycemia, unspecified: Secondary | ICD-10-CM | POA: Diagnosis not present

## 2022-03-25 DIAGNOSIS — L89159 Pressure ulcer of sacral region, unspecified stage: Secondary | ICD-10-CM

## 2022-03-25 DIAGNOSIS — I959 Hypotension, unspecified: Secondary | ICD-10-CM | POA: Diagnosis not present

## 2022-03-25 DIAGNOSIS — E43 Unspecified severe protein-calorie malnutrition: Secondary | ICD-10-CM

## 2022-03-25 LAB — CBC WITH DIFFERENTIAL/PLATELET
Abs Immature Granulocytes: 0.02 10*3/uL (ref 0.00–0.07)
Basophils Absolute: 0 10*3/uL (ref 0.0–0.1)
Basophils Relative: 1 %
Eosinophils Absolute: 0.3 10*3/uL (ref 0.0–0.5)
Eosinophils Relative: 5 %
HCT: 22.8 % — ABNORMAL LOW (ref 36.0–46.0)
Hemoglobin: 7 g/dL — ABNORMAL LOW (ref 12.0–15.0)
Immature Granulocytes: 0 %
Lymphocytes Relative: 40 %
Lymphs Abs: 2.2 10*3/uL (ref 0.7–4.0)
MCH: 27.1 pg (ref 26.0–34.0)
MCHC: 30.7 g/dL (ref 30.0–36.0)
MCV: 88.4 fL (ref 80.0–100.0)
Monocytes Absolute: 0.5 10*3/uL (ref 0.1–1.0)
Monocytes Relative: 9 %
Neutro Abs: 2.6 10*3/uL (ref 1.7–7.7)
Neutrophils Relative %: 45 %
Platelets: 260 10*3/uL (ref 150–400)
RBC: 2.58 MIL/uL — ABNORMAL LOW (ref 3.87–5.11)
RDW: 16.3 % — ABNORMAL HIGH (ref 11.5–15.5)
WBC: 5.6 10*3/uL (ref 4.0–10.5)
nRBC: 0 % (ref 0.0–0.2)

## 2022-03-25 LAB — BASIC METABOLIC PANEL
Anion gap: 9 (ref 5–15)
BUN: 33 mg/dL — ABNORMAL HIGH (ref 6–20)
CO2: 20 mmol/L — ABNORMAL LOW (ref 22–32)
Calcium: 10.2 mg/dL (ref 8.9–10.3)
Chloride: 114 mmol/L — ABNORMAL HIGH (ref 98–111)
Creatinine, Ser: 2.13 mg/dL — ABNORMAL HIGH (ref 0.44–1.00)
GFR, Estimated: 26 mL/min — ABNORMAL LOW (ref >60)
Glucose, Bld: 90 mg/dL (ref 70–99)
Potassium: 3.9 mmol/L (ref 3.5–5.1)
Sodium: 143 mmol/L (ref 135–145)

## 2022-03-25 LAB — GLUCOSE, CAPILLARY
Glucose-Capillary: 106 mg/dL — ABNORMAL HIGH (ref 70–99)
Glucose-Capillary: 127 mg/dL — ABNORMAL HIGH (ref 70–99)
Glucose-Capillary: 128 mg/dL — ABNORMAL HIGH (ref 70–99)
Glucose-Capillary: 140 mg/dL — ABNORMAL HIGH (ref 70–99)
Glucose-Capillary: 146 mg/dL — ABNORMAL HIGH (ref 70–99)
Glucose-Capillary: 167 mg/dL — ABNORMAL HIGH (ref 70–99)
Glucose-Capillary: 176 mg/dL — ABNORMAL HIGH (ref 70–99)
Glucose-Capillary: 64 mg/dL — ABNORMAL LOW (ref 70–99)
Glucose-Capillary: 66 mg/dL — ABNORMAL LOW (ref 70–99)
Glucose-Capillary: 74 mg/dL (ref 70–99)
Glucose-Capillary: 95 mg/dL (ref 70–99)

## 2022-03-25 LAB — VANCOMYCIN, RANDOM: Vancomycin Rm: 16 ug/mL

## 2022-03-25 MED ORDER — CHLORHEXIDINE GLUCONATE CLOTH 2 % EX PADS
6.0000 | MEDICATED_PAD | Freq: Every day | CUTANEOUS | Status: DC
  Administered 2022-03-25 – 2022-04-08 (×15): 6 via TOPICAL

## 2022-03-25 MED ORDER — DEXTROSE 50 % IV SOLN
INTRAVENOUS | Status: AC
  Administered 2022-03-25: 50 mL via INTRAVENOUS
  Filled 2022-03-25: qty 50

## 2022-03-25 MED ORDER — OSMOLITE 1.2 CAL PO LIQD
1000.0000 mL | ORAL | Status: DC
  Filled 2022-03-25 (×4): qty 1000

## 2022-03-25 MED ORDER — DEXTROSE 50 % IV SOLN: 50.0000 mL | Freq: Once | INTRAVENOUS | Status: AC

## 2022-03-25 MED ORDER — VANCOMYCIN HCL IN DEXTROSE 1-5 GM/200ML-% IV SOLN
1000.0000 mg | Freq: Once | INTRAVENOUS | Status: AC
  Administered 2022-03-25: 1000 mg via INTRAVENOUS
  Filled 2022-03-25: qty 200

## 2022-03-25 MED ORDER — DEXTROSE 50 % IV SOLN
INTRAVENOUS | Status: AC
  Administered 2022-03-25: 50 mL
  Filled 2022-03-25: qty 50

## 2022-03-25 NOTE — Progress Notes (Addendum)
HD#3 Subjective:   Summary: [name] is a 61 y.o. with a pertinent PMH of HFpEF, COPD, GERD, HLD, HTN, hypothyroidism, OSA on CPAP, PVD, sinus bradycardia s/p pacemaker, and insulin-dependent type 2 diabetes   Overnight Events: no acute events overnight  Lanesha is resting in bed comfortably in no acute distress.   Objective:  Vital signs in last 24 hours: Vitals:   03/24/22 2006 03/24/22 2305 03/25/22 0307 03/25/22 0913  BP: (!) 123/44 (!) 116/47 (!) 129/52 (!) 123/56  Pulse: 77 63  79  Resp: 20 18 18 15   Temp: 98.7 F (37.1 C) 97.8 F (36.6 C) 97.8 F (36.6 C) 97.7 F (36.5 C)  TempSrc: Oral Oral Oral Oral  SpO2: 92% 92% 92% 97%  Weight:      Height:       Supplemental O2: Room air   Physical Exam:  Constitutional: in no acute distress HENT: normocephalic atraumatic Eyes: conjunctiva non-erythematous Neck: supple Cardiovascular: regular rate and rhythm, no m/r/g Pulmonary/Chest: normal work of breathing on room air Abdominal: soft, non-tender, non-distended MSK: normal bulk and tone. Right BKA with granulation tissue and drainage.  Skin: warm and dry Psych: normal mood  Filed Weights   03/22/22 1337 03/22/22 1915  Weight: 85.5 kg 85 kg     Intake/Output Summary (Last 24 hours) at 03/25/2022 1129 Last data filed at 03/25/2022 0300 Gross per 24 hour  Intake --  Output 1200 ml  Net -1200 ml   Net IO Since Admission: -3,565.61 mL [03/25/22 1129]  Pertinent Labs:    Latest Ref Rng & Units 03/25/2022    3:13 AM 03/24/2022    4:46 AM 03/23/2022    4:58 AM  CBC  WBC 4.0 - 10.5 K/uL 5.6  6.7  8.6   Hemoglobin 12.0 - 15.0 g/dL 7.0  7.4  7.3   Hematocrit 36.0 - 46.0 % 22.8  24.8  24.8   Platelets 150 - 400 K/uL 260  324  318        Latest Ref Rng & Units 03/25/2022    3:13 AM 03/24/2022    4:46 AM 03/23/2022    4:58 AM  CMP  Glucose 70 - 99 mg/dL 90  73  42   BUN 6 - 20 mg/dL 33  43  68   Creatinine 0.44 - 1.00 mg/dL 05/23/2022  3.22  0.25   Sodium 135 - 145  mmol/L 143  142  142   Potassium 3.5 - 5.1 mmol/L 3.9  3.9  4.1   Chloride 98 - 111 mmol/L 114  117  113   CO2 22 - 32 mmol/L 20  18  17    Calcium 8.9 - 10.3 mg/dL 4.27  9.5    Total Protein 6.5 - 8.1 g/dL   6.2   Total Bilirubin 0.3 - 1.2 mg/dL   0.8   Alkaline Phos 38 - 126 U/L   109   AST 15 - 41 U/L   23   ALT 0 - 44 U/L   15     Imaging: No results found.  Assessment/Plan:   Principal Problem:   AKI (acute kidney injury) (HCC) Active Problems:   Uncontrolled type 2 diabetes mellitus with hyperglycemia (HCC)   Dehiscence of amputation stump of right lower extremity (HCC)   Sacral wound   Severe protein-calorie malnutrition (HCC)   Patient Summary: [name] is a 61 y.o. living with a history of uncontrolled T2DM, chronic sacral wound, right BKA with  exposed bone and poor wound healing who presented with hypotension and admitted for sepsis and malnutrition.   Hypotension Sepsis vs malnutrition. MAPS above 70 without IV fluids. Will continue IV antibiotics. Continues to have poor appetite, discussed cotrek with patient who is interested in having it placed.  -continue IV abx, vancomycin and cefepmine day 3 -BC negative -cotrek team consulted, awaiting placement of tube -MAP goal above 65. Continue fluids if need be  Right BKA s/p transtibial amputation with ulceration and bony exposure Evaluated by orthopedics yesterday, discussed right AKA with patient however she is not interested. On exam, boney exposure with granulation tissue and some drainage. 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 Family to have ongoing discussions with aptient about wishes and best plan moving forward. With elevated A1c healing will be difficult with AKA. Without repair of right BKA she is high risk for infection.  -continue abx as per above -orthopedics following, appreciate their assistance -palliative will being involvement, appreciate their help as  well  Chronic sacral wound Wound care following. Appreciate their assistance. Wound appears to be healing compared to prior images. Patient needs improvement of glucose control to help wound healing.   Malnutrition Awaiting cortrek placement  Episodic Hypoglycemia If further episodes, will obtain insulin and c-peptide levels while hypoglycemic   Diet: Tube Feeds IVF: None,None VTE: Enoxaparin Code: Full Family Update: Sister updated by phone today  Dispo: Anticipated discharge back to facility once medically stable for discharge  Thalia Bloodgood DO Internal Medicine Resident PGY-3 Please contact the on call pager after 5 pm and on weekends at [phone].

## 2022-03-25 NOTE — Consult Note (Signed)
Palliative Medicine Inpatient Consult Note  Reason for consult:  goals of care  HPI: 61 year old with PMH history of HFpEF, COPD, GERD, HLD, HTN, hypothyroidism , OSA on CPAP, PVD, sinus tachycardia s/p pacemaker, and IDDM who has a chronic sacral wound that was I &D with hospitalization 6/18-2/27, right BKA with exposed bone and poor wound healing who presented with hypotension and admitted for sepsis and malnutrition. [name] has been ordered. Ortho has consulted for right BKA repair. Per note patient has shared [name] did want to have additional surgery to stump.   Clinical Assessment/Goals of Care: I have reviewed medical records including EPIC notes, labs and imaging, received report from bedside RN Maudie Mercury, assessed the patient.    I met with [name], her brother [name], and sister [name] to further discuss diagnosis prognosis, [place], EOL wishes, disposition and options.   I introduced Palliative Medicine as specialized medical care for people living with serious illness. It focuses on providing relief from the symptoms and stress of a serious illness. The goal is to improve quality of life for both the patient and the family.  [name] lives by herself in [place]. Her family shared that [name] has one son that is incarcerated. 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 Her brother lives in [place] and her sister in [place]. Patient has neighbors who look out for her. [name] is of [employer] and family asked for Chaplain to visit.   A detailed discussion was had today regarding advanced directives.  Concepts specific to code status, artifical feeding and hydration, continued IV antibiotics and rehospitalization was had.  The difference between a aggressive medical intervention path  and a palliative comfort care path for this patient at this time was had. Values and goals of care important to patient and family were attempted to be elicited. We discussed diagnoses, poor diabetes control, sacral wound,  malnutrition  and consideration of stump revision if indicated  They all want aggressive treatment, include tube feedings, full code status and surgery for stump revision if indicated even though outcomes would be poor with lack of diabetes management and nutritional status.Brother and sister are aware that [name] is malnourished and needs to focus on nutritional intake and diabetes management  for wound healing.   Discussed the importance of continued conversation with family and their  medical providers regarding overall plan of care and treatment options, ensuring decisions are within the context of the patients values and GOCs.  Decision Maker:patient with brother [name]. There is no written MPOA. Verbally [name] says [name] would make decisions if [name] cannot  SUMMARY OF RECOMMENDATIONS    Code Status/Advance Care Planning: FULL CODE  Symptom Management:  Pain: oxycodone IR prn   Palliative Prophylaxis:  GERD: pantroprazole 40 mg daily    Psycho-social/Spiritual:  Desire for further Chaplaincy support: yes, consult placed Additional Recommendations: Continued conversations if condition changes to ensure care is within the context of patient and family desires.   Discharge Planning: Skilled nursing  Review of Systems  Constitutional:  Positive for malaise/fatigue.  Respiratory: Negative.    Cardiovascular: Negative.   Gastrointestinal: Negative.   Genitourinary: Negative.   Musculoskeletal:  Positive for joint pain.  Neurological:  Positive for weakness.  Endo/Heme/Allergies: Negative.   Psychiatric/Behavioral: Negative.         03/25/2022    4:11 PM 03/25/2022    1:00 PM 03/25/2022   12:00 PM  Vitals with BMI  Systolic 048 889 169  Diastolic 91 47  52  Pulse 78 72 71    Physical Exam Constitutional:      General: [name] is not in acute distress.    Appearance: [name] is obese. [name] is ill-appearing. [name] is not toxic-appearing or diaphoretic.  HENT:     Mouth/Throat:      Mouth: Mucous membranes are moist.  Cardiovascular:     Rate and Rhythm: Normal rate and regular rhythm.     Comments: paced Pulmonary:     Effort: No respiratory distress.     Breath sounds: No wheezing or rales.  Chest:     Chest wall: No tenderness.  Abdominal:     General: There is no distension.     Palpations: Abdomen is soft.     Tenderness: There is no abdominal tenderness. There is no guarding.  Musculoskeletal:     Comments: L BKA, R BKA with dressing in place  Skin:    Comments: Reported sacral wound, dressing in place  Neurological:     Mental Status: [name] is alert.     GCS: GCS eye subscore is 4. GCS verbal subscore is 4. GCS motor subscore is 6.     Comments: O x 2 person place  Psychiatric:        Attention and Perception: [name] is inattentive.        Behavior: Behavior is agitated.     Comments: Agitation picking at probes and wires, decreased with family present     PPS: 40%   This conversation/these recommendations were discussed with patient primary care team, Dr. Philipp Ovens and Dr. Agustin Cree via secure chat.  Thank you for the opportunity to participate in the care of this patient and family.    Total Time: 85 minutes Greater than 50%  of this time was spent counseling and coordinating care related to the above assessment and plan.  Lindell Spar, NP [facility] Palliative Medicine Team Team Cell Phone: [phone] Please utilize secure chat with additional questions, if there is no response within 30 minutes please call the above phone number  Palliative Medicine Team providers are available by phone from 7am to 7pm daily and can be reached through the team cell phone.  Should this patient require assistance outside of these hours, please call the patient's attending physician.

## 2022-03-25 NOTE — Progress Notes (Signed)
Writer attempted to page Cortrak team no service/number listed in Amion paging system. Writer paged inpatient dietitian [phone]) for assistance with cortrak place @ 1030 and 1230 with no return call. Dr. Belva Agee notified .

## 2022-03-25 NOTE — Progress Notes (Signed)
Pharmacy Antibiotic Note- follow-up  [name] is a 61 y.o. female admitted on 03/22/2022 presenting from facility with leg and sacral wounds, concern for sepsis.  Pharmacy was been consulted for vancomycin and cefepime dosing.  Plan: Vanco random level reported as 16 mcg/mL Give vanco 1 gram ic x1 dose Vanco random level 8/6 w/ AM labs Cefepime 2g IV every 24 hours Monitor renal function, Cx and clinical progression to narrow Vancomycin levels as needed  Height: 5\' 5"  (165.1 cm) Weight: 85 kg (187 lb 6.3 oz) IBW/kg (Calculated) : 57  Temp (24hrs), Avg:98.1 F (36.7 C), Min:97.8 F (36.6 C), Max:98.7 F (37.1 C)  Recent Labs  Lab 03/22/22 1340 03/22/22 1354 03/22/22 1548 03/23/22 0458 03/23/22 0700 03/23/22 0941 03/24/22 0446 03/25/22 0313  WBC 11.2*  --   --  8.6  --   --  6.7 5.6  CREATININE 4.22*  --   --  3.12*  --   --  2.30* 2.13*  LATICACIDVEN  --  1.6 1.0  --  0.8 1.1  --   --   VANCORANDOM  --   --   --   --   --  25  --  16     Estimated Creatinine Clearance: 30.2 mL/min (A) (by C-G formula based on SCr of 2.13 mg/dL (H)).    Allergies  Allergen Reactions   Penicillins Hives    Has patient had a PCN reaction causing immediate rash, facial/tongue/throat swelling, SOB or lightheadedness with hypotension: No Has patient had a PCN reaction causing severe rash involving mucus membranes or skin necrosis: No Has patient had a PCN reaction that required hospitalization No Has patient had a PCN reaction occurring within the last 10 years: No If all of the above answers are "NO", then may proceed with Cephalosporin use.    Dulaglutide Diarrhea    Trulicity- "Allergic," per MAR   Tositumomab Other (See Comments)    "Allergic," per MAR   Erythromycin Diarrhea and Other (See Comments)    "Allergic," per MAR   Gadolinium Nausea And Vomiting and Other (See Comments)     Code: VOM, Desc: Pt began vomiting immed post infusion of multihance, Onset Date:  05/25/22 "Allergic," per MAR    Iodine-131 Nausea And Vomiting and Other (See Comments)    "Allergic," per MAR   Ivp Dye [Iodinated Contrast Media] Nausea And Vomiting and Other (See Comments)    "Allergic," per MAR    Metformin Diarrhea and Other (See Comments)    "Allergic," per MAR   Micro's 8/2 Bcx- NGTD [D3]  Siria Calandro BS, PharmD, BCPS Clinical Pharmacist 03/25/2022 8:28 AM  Contact: [phone] after 3 PM  "Be curious, not judgmental..." -[phone]

## 2022-03-26 LAB — BASIC METABOLIC PANEL
Anion gap: 6 (ref 5–15)
Anion gap: 4 — ABNORMAL LOW (ref 5–15)
BUN: 25 mg/dL — ABNORMAL HIGH (ref 6–20)
BUN: 22 mg/dL — ABNORMAL HIGH (ref 6–20)
CO2: 20 mmol/L — ABNORMAL LOW (ref 22–32)
CO2: 20 mmol/L — ABNORMAL LOW (ref 22–32)
Calcium: 9.7 mg/dL (ref 8.9–10.3)
Calcium: 9.4 mg/dL (ref 8.9–10.3)
Chloride: 113 mmol/L — ABNORMAL HIGH (ref 98–111)
Chloride: 116 mmol/L — ABNORMAL HIGH (ref 98–111)
Creatinine, Ser: 2.14 mg/dL — ABNORMAL HIGH (ref 0.44–1.00)
Creatinine, Ser: 1.87 mg/dL — ABNORMAL HIGH (ref 0.44–1.00)
GFR, Estimated: 26 mL/min — ABNORMAL LOW (ref >60)
GFR, Estimated: 30 mL/min — ABNORMAL LOW (ref >60)
Glucose, Bld: 106 mg/dL — ABNORMAL HIGH (ref 70–99)
Glucose, Bld: 212 mg/dL — ABNORMAL HIGH (ref 70–99)
Potassium: 3.4 mmol/L — ABNORMAL LOW (ref 3.5–5.1)
Potassium: 4.5 mmol/L (ref 3.5–5.1)
Sodium: 139 mmol/L (ref 135–145)
Sodium: 140 mmol/L (ref 135–145)

## 2022-03-26 LAB — IRON AND TIBC
Iron: 27 ug/dL — ABNORMAL LOW (ref 28–170)
Saturation Ratios: 14 % (ref 10.4–31.8)
TIBC: 190 ug/dL — ABNORMAL LOW (ref 250–450)
UIBC: 163 ug/dL

## 2022-03-26 LAB — TECHNOLOGIST SMEAR REVIEW

## 2022-03-26 LAB — GLUCOSE, CAPILLARY
Glucose-Capillary: 120 mg/dL — ABNORMAL HIGH (ref 70–99)
Glucose-Capillary: 123 mg/dL — ABNORMAL HIGH (ref 70–99)
Glucose-Capillary: 137 mg/dL — ABNORMAL HIGH (ref 70–99)
Glucose-Capillary: 176 mg/dL — ABNORMAL HIGH (ref 70–99)
Glucose-Capillary: 191 mg/dL — ABNORMAL HIGH (ref 70–99)
Glucose-Capillary: 42 mg/dL — CL (ref 70–99)
Glucose-Capillary: 44 mg/dL — CL (ref 70–99)
Glucose-Capillary: 46 mg/dL — ABNORMAL LOW (ref 70–99)
Glucose-Capillary: 74 mg/dL (ref 70–99)
Glucose-Capillary: 94 mg/dL (ref 70–99)

## 2022-03-26 LAB — CBC WITH DIFFERENTIAL/PLATELET
Abs Immature Granulocytes: 0.03 10*3/uL (ref 0.00–0.07)
Basophils Absolute: 0 10*3/uL (ref 0.0–0.1)
Basophils Relative: 0 %
Eosinophils Absolute: 0.2 10*3/uL (ref 0.0–0.5)
Eosinophils Relative: 3 %
HCT: 23.4 % — ABNORMAL LOW (ref 36.0–46.0)
Hemoglobin: 7 g/dL — ABNORMAL LOW (ref 12.0–15.0)
Immature Granulocytes: 1 %
Lymphocytes Relative: 33 %
Lymphs Abs: 1.8 10*3/uL (ref 0.7–4.0)
MCH: 26.8 pg (ref 26.0–34.0)
MCHC: 29.9 g/dL — ABNORMAL LOW (ref 30.0–36.0)
MCV: 89.7 fL (ref 80.0–100.0)
Monocytes Absolute: 0.6 10*3/uL (ref 0.1–1.0)
Monocytes Relative: 11 %
Neutro Abs: 2.8 10*3/uL (ref 1.7–7.7)
Neutrophils Relative %: 52 %
Platelets: 291 10*3/uL (ref 150–400)
RBC: 2.61 MIL/uL — ABNORMAL LOW (ref 3.87–5.11)
RDW: 16.5 % — ABNORMAL HIGH (ref 11.5–15.5)
WBC: 5.5 10*3/uL (ref 4.0–10.5)
nRBC: 0 % (ref 0.0–0.2)

## 2022-03-26 LAB — MAGNESIUM: Magnesium: 1.9 mg/dL (ref 1.7–2.4)

## 2022-03-26 LAB — VANCOMYCIN, RANDOM: Vancomycin Rm: 28 ug/mL

## 2022-03-26 LAB — FERRITIN: Ferritin: 235 ng/mL (ref 11–307)

## 2022-03-26 MED ORDER — DEXTROSE IN LACTATED RINGERS 5 % IV SOLN: INTRAVENOUS | Status: AC

## 2022-03-26 MED ORDER — GLUCOSE 40 % PO GEL
1.0000 | Freq: Once | ORAL | Status: AC
  Administered 2022-03-26: 31 g via ORAL
  Filled 2022-03-26: qty 1

## 2022-03-26 MED ORDER — DEXTROSE 50 % IV SOLN
INTRAVENOUS | Status: AC
  Filled 2022-03-26: qty 50

## 2022-03-26 MED ORDER — POTASSIUM CHLORIDE 10 MEQ/100ML IV SOLN
10.0000 meq | INTRAVENOUS | Status: AC
  Administered 2022-03-26 (×4): 10 meq via INTRAVENOUS
  Filled 2022-03-26 (×3): qty 100

## 2022-03-26 MED ORDER — DEXTROSE IN LACTATED RINGERS 5 % IV SOLN
INTRAVENOUS | Status: AC
Start: 2022-03-26 — End: 2022-03-26

## 2022-03-26 NOTE — Progress Notes (Addendum)
Pharmacy Antibiotic Note- follow-up  [name] is a 61 y.o. female admitted on 03/22/2022 presenting from facility with leg and sacral wounds, concern for sepsis.  Pharmacy was been consulted for vancomycin and cefepime dosing.  Vanco random level reported as 28 mcg/mL this morning. Patients renal function is unchanged and she remains afebrile.   Plan: Hold vancomycin today, 8/6.  Measure another random vancomycin level tomorrow 8/7 with AM labs.  Continue Cefepime 2g IV every 24 hours - dose appropriate for indication and renal function.  Monitor renal function, Cx and clinical progression to narrow  Micro's 8/2 Bcx- NG x 4 days   Height: 5\' 5"  (165.1 cm) Weight: 70 kg (154 lb 5.2 oz) IBW/kg (Calculated) : 57  Temp (24hrs), Avg:97.8 F (36.6 C), Min:97.8 F (36.6 C), Max:97.9 F (36.6 C)  Recent Labs  Lab 03/22/22 1340 03/22/22 1354 03/22/22 1548 03/23/22 0458 03/23/22 0700 03/23/22 0941 03/23/22 0941 03/24/22 0446 03/25/22 0313 03/26/22 0233  WBC 11.2*  --   --  8.6  --   --   --  6.7 5.6 5.5  CREATININE 4.22*  --   --  3.12*  --   --   --  2.30* 2.13* 2.14*  LATICACIDVEN  --  1.6 1.0  --  0.8 1.1  --   --   --   --   VANCORANDOM  --   --   --   --   --  25   < >  --  16 28   < > = values in this interval not displayed.     Estimated Creatinine Clearance: 27.5 mL/min (A) (by C-G formula based on SCr of 2.14 mg/dL (H)).    Allergies  Allergen Reactions   Penicillins Hives    Has patient had a PCN reaction causing immediate rash, facial/tongue/throat swelling, SOB or lightheadedness with hypotension: No Has patient had a PCN reaction causing severe rash involving mucus membranes or skin necrosis: No Has patient had a PCN reaction that required hospitalization No Has patient had a PCN reaction occurring within the last 10 years: No If all of the above answers are "NO", then may proceed with Cephalosporin use.    Dulaglutide Diarrhea    Trulicity- "Allergic,"  per MAR   Tositumomab Other (See Comments)    "Allergic," per MAR   Erythromycin Diarrhea and Other (See Comments)    "Allergic," per MAR   Gadolinium Nausea And Vomiting and Other (See Comments)     Code: VOM, Desc: Pt began vomiting immed post infusion of multihance, Onset Date: 05/26/22 "Allergic," per MAR    Iodine-131 Nausea And Vomiting and Other (See Comments)    "Allergic," per MAR   Ivp Dye [Iodinated Contrast Media] Nausea And Vomiting and Other (See Comments)    "Allergic," per MAR    Metformin Diarrhea and Other (See Comments)    "Allergic," per [facility]    [facility], PharmD  PGY1 Pharmacy Resident  -------------------------------------------------------------------------------------------------------------------- I discussed / reviewed the pharmacy note by Dr. Blane Ohara and I agree with the resident's findings and plans as documented.  Belva Crome BS, PharmD, BCPS Clinical Pharmacist 03/26/2022 10:30 AM  Contact: [phone] after 3 PM  "Be curious, not judgmental..." -[phone] --------------------------------------------------------------------------------------------------------------------

## 2022-03-26 NOTE — Progress Notes (Signed)
hypoglycemia  Hypoglycemic Event  CBG: 44  Treatment: D50 50 mL (25 gm)  Symptoms: None   Follow-up CBG: Time:1627 CBG Result:191   Possible Reasons for Event: Inadequate meal intake  Comments/MD notified:    Chrisandra Netters Apple

## 2022-03-26 NOTE — Progress Notes (Addendum)
HD#4 Subjective:   Summary: Ms. Gregg is a 61 y.o. with a pertinent PMH of HFpEF, COPD, GERD, HLD, HTN, hypothyroidism, OSA on CPAP, PVD, sinus bradycardia s/p pacemaker, and insulin-dependent type 2 diabetes who presented with hypotension and admitted for sepsis and malnutrition.   Overnight Events: hypoglycemic episode, given D50 and improved. Informed that c-peptide and insulin labs were collected after dextrose given.   She was somnolent this morning but was able to be aroused. Still not eating well.   Objective:  Vital signs in last 24 hours: Vitals:   03/25/22 2000 03/26/22 0000 03/26/22 0305 03/26/22 0500  BP: (!) 125/45 (!) 124/47 (!) 158/62   Pulse: 70 73 69   Resp: 12 13 14    Temp: 97.9 F (36.6 C) 97.8 F (36.6 C) 97.9 F (36.6 C)   TempSrc: Oral Oral Axillary   SpO2: 92% 93% 97%   Weight:    70 kg  Height:         Physical Exam:  Constitutional: sleeping but can be aroused, in no acute distress HENT: normocephalic atraumatic Neck: supple Cardiovascular: regular rate and rhythm Pulmonary: normal work of breathing Extremities: Bilateral BKA, right BKA residual limb has ulcerated wound with clean bandage Neurological: somnolent  Skin: warm and dry  Filed Weights   03/22/22 1337 03/22/22 1915 03/26/22 0500  Weight: 85.5 kg 85 kg 70 kg     Intake/Output Summary (Last 24 hours) at 03/26/2022 0742 Last data filed at 03/26/2022 0500 Gross per 24 hour  Intake 120 ml  Output 975 ml  Net -855 ml   Net IO Since Admission: -4,420.61 mL [03/26/22 0742]  Pertinent Labs:    Latest Ref Rng & Units 03/26/2022    2:33 AM 03/25/2022    3:13 AM 03/24/2022    4:46 AM  CBC  WBC 4.0 - 10.5 K/uL 5.5  5.6  6.7   Hemoglobin 12.0 - 15.0 g/dL 7.0  7.0  7.4   Hematocrit 36.0 - 46.0 % 23.4  22.8  24.8   Platelets 150 - 400 K/uL 291  260  324        Latest Ref Rng & Units 03/26/2022    2:33 AM 03/25/2022    3:13 AM 03/24/2022    4:46 AM  CMP  Glucose 70 - 99 mg/dL 05/24/2022  90   73   BUN 6 - 20 mg/dL 25  33  43   Creatinine 0.44 - 1.00 mg/dL 253  6.64  4.03   Sodium 135 - 145 mmol/L 139  143  142   Potassium 3.5 - 5.1 mmol/L 3.4  3.9  3.9   Chloride 98 - 111 mmol/L 113  114  117   CO2 22 - 32 mmol/L 20  20  18    Calcium 8.9 - 10.3 mg/dL 9.7  4.74  9.5     Imaging: No results found.  Assessment/Plan:   Principal Problem:   AKI (acute kidney injury) (HCC) Active Problems:   Uncontrolled type 2 diabetes mellitus with hyperglycemia (HCC)   Dehiscence of amputation stump of right lower extremity (HCC)   Sacral wound   Severe protein-calorie malnutrition (HCC)   Patient Summary: GURBANI FIGGE is a 61 y.o. with a pertinent PMH of HFpEF, COPD, GERD, HLD, HTN, hypothyroidism, OSA on CPAP, PVD, sinus bradycardia s/p pacemaker, and insulin-dependent type 2 diabetes who presented with hypotension and admitted for sepsis and malnutrition.   Hypotension Sepsis vs malnutrition. MAPS above 70 without IV fluids. Continue  IV antibiotics. Still having poor appetite, awaiting for cortrak placement. BC showed no growth but inadequate volume.  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 Right LE ulceration with bony exposure does not appear infected. Xray with evidence of soft tissue ulceration unable to rule out osteomyelitis. Ortho consulted and plan currently is daily wound care and dressing change. Palliative care talked with family and they want full scope of care including surgery for stump revision. Will discuss with Ortho, appreciate their recs. Consider ID consult.  -Continue vancomycin and cefepime, day 4 -Wound care and dressing change daily  AKI on CKD 3B Chronic Foley catheter Baseline creatinine of 1.5 from prior hospitalization. AKI improving since admission. Likely  consider prerenal etiology from hypotension and poor p.o. intake.  -BMP -strict I/O -Continue foley care -if worsening renal function, consider nephrology referral  Chronic sacral wound She reports chronic pain from the sacral wound. Wound care is following. Concern for poor wound healing given malnutrition and poor glycemic control.  Appreciate wound care recs.  -Continue wound care -Continue home Tylenol and oxycodone pain regimen  Malnutrition Discussed with family and they agreed to Cortrak placement.  Difficult arousing patient this morning.  Awaiting cortrak placement. -MVI and Ensure Enlive po BID  Hypokalemia Potassium dropped to 3.4.  Magnesium was 1.9.  -IV potassium x 4  -recheck BMP tonight   Episodic Hypoglycemia Insulin-dependent type 2 diabetes mellitus Patient still having overnight hypoglycemia episodes. C-peptide labs collected but was collected after dextrose was given. If she has another hypoglycemic event and asymptomatic then we will need to collect labs prior to giving glucose.   -CBG every 2 hours -recollect c-peptide and insulin labs prior to glucose admin during hypoglycemic event  HFpEF 2017 Echo showed EF of 65-70%. Continue to monitor given she is on IV fluids.   Normocytic anemia  Anemia of chronic disease in setting of multiple chronic wounds. Decrease could be from IV fluid dilution. Will continue to monitor. -CBC -if hemoglobin <7, consider transfuse  Hx of hypertension Hold lisinopril and carvedilol in setting of hypotension and AKI  Hypothyroidism TSH was 0.920 today. Will continue levothyroxine.  HLD Continue home rosuvastatin 40mg .  OSA CPAP qhs  GERD Continue Protonix 40 mg daily.  Diet: Normal IVF: None,None VTE: Enoxaparin Code: Full   Dispo: Anticipated discharge to nursing facility pending medically stable.   , DO Internal Medicine Resident PGY-1 Please contact the on call pager after 5 pm and on  weekends at [phone].

## 2022-03-27 ENCOUNTER — Inpatient Hospital Stay (HOSPITAL_COMMUNITY): Payer: Medicare HMO

## 2022-03-27 DIAGNOSIS — E46 Unspecified protein-calorie malnutrition: Secondary | ICD-10-CM | POA: Diagnosis not present

## 2022-03-27 DIAGNOSIS — N1832 Chronic kidney disease, stage 3b: Secondary | ICD-10-CM | POA: Diagnosis not present

## 2022-03-27 DIAGNOSIS — N179 Acute kidney failure, unspecified: Secondary | ICD-10-CM | POA: Diagnosis not present

## 2022-03-27 DIAGNOSIS — E162 Hypoglycemia, unspecified: Secondary | ICD-10-CM

## 2022-03-27 DIAGNOSIS — Z931 Gastrostomy status: Secondary | ICD-10-CM

## 2022-03-27 DIAGNOSIS — I959 Hypotension, unspecified: Secondary | ICD-10-CM

## 2022-03-27 DIAGNOSIS — E876 Hypokalemia: Secondary | ICD-10-CM | POA: Diagnosis not present

## 2022-03-27 LAB — GLUCOSE, CAPILLARY
Glucose-Capillary: 134 mg/dL — ABNORMAL HIGH (ref 70–99)
Glucose-Capillary: 143 mg/dL — ABNORMAL HIGH (ref 70–99)
Glucose-Capillary: 83 mg/dL (ref 70–99)
Glucose-Capillary: 84 mg/dL (ref 70–99)
Glucose-Capillary: 90 mg/dL (ref 70–99)

## 2022-03-27 LAB — CBC
HCT: 25.5 % — ABNORMAL LOW (ref 36.0–46.0)
Hemoglobin: 7.7 g/dL — ABNORMAL LOW (ref 12.0–15.0)
MCH: 26.9 pg (ref 26.0–34.0)
MCHC: 30.2 g/dL (ref 30.0–36.0)
MCV: 89.2 fL (ref 80.0–100.0)
Platelets: 324 10*3/uL (ref 150–400)
RBC: 2.86 MIL/uL — ABNORMAL LOW (ref 3.87–5.11)
RDW: 16.4 % — ABNORMAL HIGH (ref 11.5–15.5)
WBC: 6.3 10*3/uL (ref 4.0–10.5)
nRBC: 0 % (ref 0.0–0.2)

## 2022-03-27 LAB — CULTURE, BLOOD (ROUTINE X 2)
Culture: NO GROWTH
Culture: NO GROWTH

## 2022-03-27 LAB — BASIC METABOLIC PANEL
Anion gap: 7 (ref 5–15)
BUN: 18 mg/dL (ref 6–20)
CO2: 22 mmol/L (ref 22–32)
Calcium: 9.9 mg/dL (ref 8.9–10.3)
Chloride: 112 mmol/L — ABNORMAL HIGH (ref 98–111)
Creatinine, Ser: 1.74 mg/dL — ABNORMAL HIGH (ref 0.44–1.00)
GFR, Estimated: 33 mL/min — ABNORMAL LOW (ref >60)
Glucose, Bld: 85 mg/dL (ref 70–99)
Potassium: 4 mmol/L (ref 3.5–5.1)
Sodium: 141 mmol/L (ref 135–145)

## 2022-03-27 LAB — PREPARE RBC (CROSSMATCH)

## 2022-03-27 LAB — VANCOMYCIN, RANDOM: Vancomycin Rm: 22 ug/mL

## 2022-03-27 MED ORDER — PROSOURCE TF PO LIQD
45.0000 mL | Freq: Two times a day (BID) | ORAL | Status: DC
  Administered 2022-03-27 – 2022-03-28 (×3): 45 mL
  Filled 2022-03-27 (×7): qty 45

## 2022-03-27 MED ORDER — SODIUM CHLORIDE 0.9% IV SOLUTION: Freq: Once | INTRAVENOUS | Status: DC

## 2022-03-27 MED ORDER — JEVITY 1.5 CAL/FIBER PO LIQD
1000.0000 mL | ORAL | Status: DC
  Administered 2022-03-27: 1000 mL
  Filled 2022-03-27 (×6): qty 1000

## 2022-03-27 MED ORDER — INSULIN ASPART 100 UNIT/ML IJ SOLN
0.0000 [IU] | INTRAMUSCULAR | Status: DC
  Administered 2022-03-28: 2 [IU] via SUBCUTANEOUS
  Administered 2022-03-28: 3 [IU] via SUBCUTANEOUS
  Administered 2022-03-28 (×2): 2 [IU] via SUBCUTANEOUS
  Administered 2022-03-29 (×3): 3 [IU] via SUBCUTANEOUS
  Administered 2022-03-29 (×2): 2 [IU] via SUBCUTANEOUS
  Administered 2022-03-30 (×3): 7 [IU] via SUBCUTANEOUS
  Administered 2022-03-30: 5 [IU] via SUBCUTANEOUS
  Administered 2022-03-30 (×2): 7 [IU] via SUBCUTANEOUS
  Administered 2022-03-30: 5 [IU] via SUBCUTANEOUS
  Administered 2022-03-31 (×2): 9 [IU] via SUBCUTANEOUS
  Administered 2022-03-31: 5 [IU] via SUBCUTANEOUS

## 2022-03-27 MED ORDER — VANCOMYCIN HCL IN DEXTROSE 1-5 GM/200ML-% IV SOLN
1000.0000 mg | Freq: Once | INTRAVENOUS | Status: AC
  Administered 2022-03-28: 1000 mg via INTRAVENOUS
  Filled 2022-03-27: qty 200

## 2022-03-27 NOTE — TOC Progression Note (Signed)
Transition of Care [facility]) - Progression Note    Patient Details  Name: [name] MRN: [id number] Date of Birth: 06-May-1961  Transition of Care [facility]) CM/SW Contact  Mearl Latin, LCSW Phone Number: 03/27/2022, 8:55 AM  Clinical Narrative:    CSW continuing to follow for medical readiness.    Expected Discharge Plan: Skilled Nursing Facility Barriers to Discharge: Continued Medical Work up  Expected Discharge Plan and Services Expected Discharge Plan: Skilled Nursing Facility In-house Referral: Clinical Social Work   Post Acute Care Choice: Skilled Nursing Facility Living arrangements for the past 2 months: Skilled Nursing Facility                                       Social Determinants of Health (SDOH) Interventions    Readmission Risk Interventions    02/15/2022    9:01 AM 02/07/2022   12:17 PM  Readmission Risk Prevention Plan  Transportation Screening Complete Complete  PCP or Specialist Appt within 5-7 Days  Complete  PCP or Specialist Appt within 3-5 Days Complete   Home Care Screening  Complete  Medication Review (RN CM)  Complete  HRI or Home Care Consult Complete   Social Work Consult for Recovery Care Planning/Counseling Complete   Palliative Care Screening Not Applicable   Medication Review [employer]) Complete

## 2022-03-27 NOTE — Progress Notes (Signed)
   03/27/22 1345  Clinical Encounter Type  Visited With Patient  Visit Type Initial;Spiritual support  Referral From Nurse  Consult/Referral To Chaplain   Chaplain responded to a spiritual consult. The patient, [name], is distraught about her pending procedure and was tearful as she expressed her feeling about it.  Her world is turning and Forever is not able to express what she she expects from it.   Valerie Roys [facility] [phone]

## 2022-03-27 NOTE — Progress Notes (Signed)
Patient ID: [name], female   DOB: 1960/08/30, 61 y.o.   MRN: [id number] Patient and family wish to proceed with above-knee amputation on the right.  I will post for surgery Wednesday, and will place orders for transfusion.  Hemoglobin 7.0.

## 2022-03-27 NOTE — Procedures (Signed)
Cortrak  Tube Type:  Cortrak - 43 inches Tube Location:  Left nare Initial Placement:  Stomach Secured by: Bridle Technique Used to Measure Tube Placement:  Marking at nare/corner of mouth Cortrak Secured At:  69 cm  Cortrak Tube Team Note:  Consult received to place a Cortrak feeding tube.   X-ray is required, abdominal x-ray has been ordered by the Cortrak team. Please confirm tube placement before using the Cortrak tube.   If the tube becomes dislodged please keep the tube and contact the Cortrak team at [email or web address] (password [email or web address]) for replacement.  If after hours and replacement cannot be delayed, place a NG tube and confirm placement with an abdominal x-ray.    Jirah Rider MS, RD, LDN Please refer to AMION for RD and/or RD on-call/weekend/after hours pager   

## 2022-03-27 NOTE — Progress Notes (Signed)
Pharmacy Antibiotic Note- follow-up  [name] is a 61 y.o. female admitted on 03/22/2022 presenting from facility with leg and sacral wounds, concern for sepsis.  Pharmacy was been consulted for vancomycin and cefepime dosing.  Vanco random level reported as 28 mcg/mL this morning. Patients renal function is unchanged and she remains afebrile.   Plan: Hold vancomycin today, 8/7. Random level is 22 mcg/mL. Real function improved. Give Vanco 1 gram dose on 03/28/2022 and repeat random 03/29/2022, if med is continued s/p RAKA on 8/9 with Dr. Lajoyce Corners   Continue Cefepime 2g IV every 24 hours - dose appropriate for indication and renal function.  Monitor renal function, Cx and clinical progression to narrow  Micro's 8/2 Bcx- NG x 4 days   Height: 5\' 5"  (165.1 cm) Weight: 70 kg (154 lb 5.2 oz) IBW/kg (Calculated) : 57  Temp (24hrs), Avg:98.1 F (36.7 C), Min:98 F (36.7 C), Max:98.2 F (36.8 C)  Recent Labs  Lab 03/22/22 1354 03/22/22 1548 03/23/22 0458 03/23/22 0458 03/23/22 0700 03/23/22 0941 03/24/22 0446 03/25/22 0313 03/26/22 0233 03/26/22 1957 03/27/22 0847  WBC  --   --  8.6  --   --   --  6.7 5.6 5.5  --  6.3  CREATININE  --   --  3.12*  --   --   --  2.30* 2.13* 2.14* 1.87* 1.74*  LATICACIDVEN 1.6 1.0  --   --  0.8 1.1  --   --   --   --   --   VANCORANDOM  --   --   --    < >  --  25  --  16 28  --  22   < > = values in this interval not displayed.     Estimated Creatinine Clearance: 33.8 mL/min (A) (by C-G formula based on SCr of 1.74 mg/dL (H)).    Allergies  Allergen Reactions   Penicillins Hives    Has patient had a PCN reaction causing immediate rash, facial/tongue/throat swelling, SOB or lightheadedness with hypotension: No Has patient had a PCN reaction causing severe rash involving mucus membranes or skin necrosis: No Has patient had a PCN reaction that required hospitalization No Has patient had a PCN reaction occurring within the last 10 years: No If  all of the above answers are "NO", then may proceed with Cephalosporin use.    Dulaglutide Diarrhea    Trulicity- "Allergic," per MAR   Tositumomab Other (See Comments)    "Allergic," per MAR   Erythromycin Diarrhea and Other (See Comments)    "Allergic," per MAR   Gadolinium Nausea And Vomiting and Other (See Comments)     Code: VOM, Desc: Pt began vomiting immed post infusion of multihance, Onset Date: 05/27/22 "Allergic," per MAR    Iodine-131 Nausea And Vomiting and Other (See Comments)    "Allergic," per MAR   Ivp Dye [Iodinated Contrast Media] Nausea And Vomiting and Other (See Comments)    "Allergic," per MAR    Metformin Diarrhea and Other (See Comments)    "Allergic," per [facility]    Zaidin Blyden BS, PharmD, BCPS Clinical Pharmacist 03/27/2022 11:02 AM  Contact: [phone] after 3 PM  "Be curious, not judgmental..." -[phone] --------------------------------------------------------------------------------------------------------------------

## 2022-03-27 NOTE — Progress Notes (Signed)
HD#5 Subjective:   Summary: [name] is a 61 y.o. with a pertinent PMH of HFpEF, COPD, GERD, HLD, HTN, hypothyroidism, OSA on CPAP, PVD, sinus bradycardia s/p pacemaker, and insulin-dependent type 2 diabetes who presented with hypotension and admitted for sepsis and malnutrition.   Overnight Events: none  She was awake but confused. Only able to answer yes and no to a few questions. Was tearful when discussing right LE wound and surgery.    Objective:  Vital signs in last 24 hours: Vitals:   03/27/22 1116 03/27/22 1134 03/27/22 1524 03/27/22 1525  BP: (!) 118/55 113/61 (!) 158/57   Pulse: 78 75 75 75  Resp: 17 16  18   Temp: 98.1 F (36.7 C) 97.7 F (36.5 C) 99.1 F (37.3 C) 99.1 F (37.3 C)  TempSrc: Oral Oral Oral Oral  SpO2:  99%    Weight:      Height:         Physical Exam:  Constitutional: awake, in no acute distress HENT: normocephalic atraumatic Neck: supple Cardiovascular: regular rate and rhythm Extremities: Bilateral BKA, right BKA residual limb has ulcerated wound with clean bandage Neurological: confused Skin: warm and dry Psych: tearful at times   [facility] Weights   03/22/22 1337 03/22/22 1915 03/26/22 0500  Weight: 85.5 kg 85 kg 70 kg     Intake/Output Summary (Last 24 hours) at 03/27/2022 1538 Last data filed at 03/27/2022 1534 Gross per 24 hour  Intake 1169.07 ml  Output 1800 ml  Net -630.93 ml    Net IO Since Admission: -5,051.54 mL [03/27/22 1538]  Pertinent Labs:    Latest Ref Rng & Units 03/27/2022    8:47 AM 03/26/2022    2:33 AM 03/25/2022    3:13 AM  CBC  WBC 4.0 - 10.5 K/uL 6.3  5.5  5.6   Hemoglobin 12.0 - 15.0 g/dL 7.7  7.0  7.0   Hematocrit 36.0 - 46.0 % 25.5  23.4  22.8   Platelets 150 - 400 K/uL 324  291  260        Latest Ref Rng & Units 03/27/2022    8:47 AM 03/26/2022    7:57 PM 03/26/2022    2:33 AM  CMP  Glucose 70 - 99 mg/dL 85  05/26/2022  536   BUN 6 - 20 mg/dL 18  22  25    Creatinine 0.44 - 1.00 mg/dL 144   3.15    Sodium 135 - 145 mmol/L 141  140  139   Potassium 3.5 - 5.1 mmol/L 4.0  4.5  3.4   Chloride 98 - 111 mmol/L 112  116  113   CO2 22 - 32 mmol/L 22  20  20    Calcium 8.9 - 10.3 mg/dL 9.9  9.4  9.7     Imaging: DG Abd Portable 1V  Result Date: 03/27/2022 CLINICAL DATA:  Feeding tube placement EXAM: PORTABLE ABDOMEN - 1 VIEW COMPARISON:  03/22/2022 FINDINGS: Soft feeding tube enters the stomach in has its tip in the antrum or pyloric region. Gas pattern appears unremarkable. IMPRESSION: Feeding tube tip in the antrum or pyloric region. Electronically Signed   By: M.D.   On: 03/27/2022 13:54    Assessment/Plan:   Principal Problem:   AKI (acute kidney injury) (HCC) Active Problems:   Uncontrolled type 2 diabetes mellitus with hyperglycemia (HCC)   Dehiscence of amputation stump of right lower extremity (HCC)   Sacral wound   Severe protein-calorie malnutrition (HCC)  Patient Summary: [name] is a 61 y.o. with a pertinent PMH of HFpEF, COPD, GERD, HLD, HTN, hypothyroidism, OSA on CPAP, PVD, sinus bradycardia s/p pacemaker, and insulin-dependent type 2 diabetes who presented with hypotension and admitted for sepsis and malnutrition.   Hypotension Sepsis vs malnutrition. MAPs above 70. Continue IV antibiotics. Feeding tube placed today. BC showed no growth but inadequate volume. Pending repeat BC x2.  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 Right LE ulceration with bony exposure does not appear infected. Palliative care talked with family and they want full scope of care including surgery for stump revision. Ortho plan for right AKA for Wednesday. Transfused 2 units PRBC.  -Continue vancomycin and cefepime, day 5 -Wound care and dressing change daily -surgery for Wednesday  AKI on CKD  3B Chronic Foley catheter Baseline creatinine of 1.5 from prior hospitalization. AKI improving since admission. Likely consider prerenal etiology from hypotension and poor p.o. intake.  -BMP -strict I/O -Continue foley care -if worsening renal function, consider nephrology referral  Chronic sacral wound She reports chronic pain from the sacral wound. Wound care is following. Concern for poor wound healing given malnutrition and poor glycemic control.  Appreciate wound care recs.  -Continue wound care -Continue home Tylenol and oxycodone pain regimen  Malnutrition Discussed with family and they agreed to Cortrak placement. Patient is still confused today and not eating. Cortrak placed today, will start feedings.   Hypokalemia Potassium improved to 4.5  Magnesium was 1.9. Continue to monitor. -BMP  Episodic Hypoglycemia Insulin-dependent type 2 diabetes mellitus Patient still having overnight hypoglycemia episodes. C-peptide labs collected but was collected after dextrose was given. If she has another hypoglycemic event and asymptomatic then we will need to collect labs prior to giving glucose.   -CBG every 2 hours -recollect c-peptide and insulin labs prior to glucose admin during hypoglycemic event  HFpEF 2017 Echo showed EF of 65-70%.    Normocytic anemia  Anemia of chronic disease in setting of multiple chronic wounds. Surgery planned for Wednesday so transfused 2 units PRBC.  -CBC  Hx of hypertension Hold lisinopril and carvedilol in setting of hypotension and AKI  Hypothyroidism TSH was 0.920 today. Will continue levothyroxine.  HLD Continue home rosuvastatin 40mg .  OSA CPAP qhs  GERD Continue Protonix 40 mg daily.  Diet: Normal IVF: None,None VTE: Enoxaparin Code: Full   Dispo: Anticipated discharge to nursing facility pending medically stable.   , DO Internal Medicine Resident PGY-1 Please contact the on call pager after 5 pm and on  weekends at [phone].

## 2022-03-27 NOTE — Progress Notes (Signed)
Nutrition Follow-up  DOCUMENTATION CODES:   Not applicable  INTERVENTION:   Tube feeds via Cortrak: Initiate Jevity 1.5 at 20 mL/hr and advance by 10 mL q8h to goal rate of 50 mL/hr (1200 mL/day) 45 mL ProSource TF - BID 150 mL free water flush q4h Provides 1880 kcal, 99 gm of protein, and 1812 mL total free water daily. Monitor magnesium, potassium, and phosphorus BID for at least 3 days, MD to replete as needed, as pt is at risk for refeeding syndrome given poor PO intake for 5 days. Continue Multivitamin w/ minerals daily  NUTRITION DIAGNOSIS:   Increased nutrient needs related to wound healing as evidenced by estimated needs. - Ongoing   GOAL:   Patient will meet greater than or equal to 90% of their needs - Ongoing   MONITOR:   PO intake, Supplement acceptance, Labs, TF tolerance, I & O's  REASON FOR ASSESSMENT:   Malnutrition Screening Tool, Consult Assessment of nutrition requirement/status  ASSESSMENT:   Pt admitted with hypotension, found to have AKI. PMH significant for CHF, COPD, GERD, HLD, HTN, hypothyroidism, OSA on CPAP, PVD, sinus bradycardia s/p PPM, T2DM. Recent admission 6/18-6/27 for sacral wound requiring I&D.   8/07 - Cortrak placed (tip antrum or pyloric region)  Pt resting in bed, minimal response to RD. Pt reports that she has not been eating well and that she does not like the Ensure. RD observed an open Ensure, about 1/3 completed.  Since last RD visit, meal completions documented as 0-50%.  Per ortho's note, plan for AKA revision of R BKA on Wednesday.   Medications reviewed and include: MVI, Protonix, IV antibiotics Labs reviewed: 24 hr CBG 44-191  Diet Order:   Diet Order             Diet regular Room service appropriate? Yes; Fluid consistency: Thin  Diet effective now                   EDUCATION NEEDS:   Not appropriate for education at this time  Skin:  Skin Assessment: Skin Integrity Issues: Skin Integrity Issues::  Other (Comment) Other: WOC-Right BKA posterior wound is circular, yellow, black and pink with scant purulent drainage, measuring 3.5 x 2; Sacral wound measures 12.5 x 11 x 3.5 with rolled edges and boggy pink protruding tissue Unstageable: L buttocks  Last BM:  8/4  Height:   Ht Readings from Last 1 Encounters:  03/22/22 5\' 5"  (1.651 m)    Weight:   Wt Readings from Last 1 Encounters:  03/26/22 70 kg    BMI:  Body mass index is 25.68 kg/m.  Estimated Nutritional Needs:   Kcal:  1800-2000  Protein:  90-105 grams  Fluid:  >/= 1.8 L    05/26/22 RD, LDN Clinical Dietitian See [facility] for contact information.

## 2022-03-27 NOTE — Progress Notes (Signed)
On examination today [name] was unable to answer questions appropriately about why she was in the hospital, why surgery of her right residual limb is considered and the risks and benefits of surgical amputation.  Because of this confusion her brother and sister were called.  Both believe [name] would want the procedure if it meant improving her outcomes.  Unclear if the confusion is from her osteomyelitis, poor nutritional status, or hospital delirium.  I believe it is a combination of all 3.  Amputation of the right lower extremity tentatively scheduled for Wednesday.  Will continue to monitor patient's capacity until then.

## 2022-03-27 NOTE — Progress Notes (Signed)
Patient refuses CPAP for the night  

## 2022-03-28 DIAGNOSIS — N179 Acute kidney failure, unspecified: Secondary | ICD-10-CM | POA: Diagnosis not present

## 2022-03-28 DIAGNOSIS — N1832 Chronic kidney disease, stage 3b: Secondary | ICD-10-CM | POA: Diagnosis not present

## 2022-03-28 DIAGNOSIS — E876 Hypokalemia: Secondary | ICD-10-CM | POA: Diagnosis not present

## 2022-03-28 DIAGNOSIS — E46 Unspecified protein-calorie malnutrition: Secondary | ICD-10-CM | POA: Diagnosis not present

## 2022-03-28 LAB — BPAM RBC
Blood Product Expiration Date: 202308122359
Blood Product Expiration Date: 202309022359
ISSUE DATE / TIME: 202308071109
ISSUE DATE / TIME: 202308071518
Unit Type and Rh: 5100
Unit Type and Rh: 5100

## 2022-03-28 LAB — MAGNESIUM
Magnesium: 1.8 mg/dL (ref 1.7–2.4)
Magnesium: 1.9 mg/dL (ref 1.7–2.4)
Magnesium: 1.9 mg/dL (ref 1.7–2.4)

## 2022-03-28 LAB — BASIC METABOLIC PANEL
Anion gap: 7 (ref 5–15)
BUN: 22 mg/dL — ABNORMAL HIGH (ref 6–20)
CO2: 19 mmol/L — ABNORMAL LOW (ref 22–32)
Calcium: 9.5 mg/dL (ref 8.9–10.3)
Chloride: 112 mmol/L — ABNORMAL HIGH (ref 98–111)
Creatinine, Ser: 1.72 mg/dL — ABNORMAL HIGH (ref 0.44–1.00)
GFR, Estimated: 34 mL/min — ABNORMAL LOW (ref >60)
Glucose, Bld: 105 mg/dL — ABNORMAL HIGH (ref 70–99)
Potassium: 4 mmol/L (ref 3.5–5.1)
Sodium: 138 mmol/L (ref 135–145)

## 2022-03-28 LAB — GLUCOSE, CAPILLARY
Glucose-Capillary: 104 mg/dL — ABNORMAL HIGH (ref 70–99)
Glucose-Capillary: 127 mg/dL — ABNORMAL HIGH (ref 70–99)
Glucose-Capillary: 162 mg/dL — ABNORMAL HIGH (ref 70–99)
Glucose-Capillary: 175 mg/dL — ABNORMAL HIGH (ref 70–99)
Glucose-Capillary: 197 mg/dL — ABNORMAL HIGH (ref 70–99)
Glucose-Capillary: 243 mg/dL — ABNORMAL HIGH (ref 70–99)
Glucose-Capillary: 54 mg/dL — ABNORMAL LOW (ref 70–99)
Glucose-Capillary: 99 mg/dL (ref 70–99)

## 2022-03-28 LAB — INSULIN, RANDOM: Insulin: 34.2 u[IU]/mL — ABNORMAL HIGH (ref 2.6–24.9)

## 2022-03-28 LAB — PHOSPHORUS
Phosphorus: 2.6 mg/dL (ref 2.5–4.6)
Phosphorus: 2.7 mg/dL (ref 2.5–4.6)
Phosphorus: 2.9 mg/dL (ref 2.5–4.6)

## 2022-03-28 LAB — TYPE AND SCREEN
ABO/RH(D): O POS
Antibody Screen: NEGATIVE
Unit division: 0
Unit division: 0

## 2022-03-28 LAB — ZINC: Zinc: 68 ug/dL (ref 44–115)

## 2022-03-28 LAB — CBC
HCT: 34.5 % — ABNORMAL LOW (ref 36.0–46.0)
Hemoglobin: 11.6 g/dL — ABNORMAL LOW (ref 12.0–15.0)
MCH: 29.1 pg (ref 26.0–34.0)
MCHC: 33.6 g/dL (ref 30.0–36.0)
MCV: 86.5 fL (ref 80.0–100.0)
Platelets: 236 10*3/uL (ref 150–400)
RBC: 3.99 MIL/uL (ref 3.87–5.11)
RDW: 16.7 % — ABNORMAL HIGH (ref 11.5–15.5)
WBC: 7.3 10*3/uL (ref 4.0–10.5)
nRBC: 0 % (ref 0.0–0.2)

## 2022-03-28 LAB — HEMOGLOBIN AND HEMATOCRIT, BLOOD
HCT: 35.1 % — ABNORMAL LOW (ref 36.0–46.0)
Hemoglobin: 11.6 g/dL — ABNORMAL LOW (ref 12.0–15.0)

## 2022-03-28 LAB — C-PEPTIDE: C-Peptide: 14.4 ng/mL — ABNORMAL HIGH (ref 1.1–4.4)

## 2022-03-28 MED ORDER — DEXTROSE 50 % IV SOLN
INTRAVENOUS | Status: AC
  Administered 2022-03-28: 25 g via INTRAVENOUS
  Filled 2022-03-28: qty 50

## 2022-03-28 MED ORDER — DEXTROSE 50 % IV SOLN: 25.0000 g | INTRAVENOUS | Status: AC

## 2022-03-28 NOTE — H&P (View-Only) (Signed)
Patient ID: [name], female   DOB: 1960/11/01, 61 y.o.   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  Patient states that she would like to proceed with an above-the-knee amputation.  Risk and benefits were discussed including risk of the wound not healing need for additional surgery.  Patient states she understands wished to proceed at this time.  Plan for surgery Wednesday.

## 2022-03-28 NOTE — Telephone Encounter (Signed)
I spoke with the patient brother and she is in the hospital. She will have her leg amputated from the knee down. He thinks she may not make it out the hospital. Her monitor is not near her.

## 2022-03-28 NOTE — Progress Notes (Addendum)
HD#6 Subjective:   Summary: [name] is a 61 y.o. with a pertinent PMH of HFpEF, COPD, GERD, HLD, HTN, hypothyroidism, OSA on CPAP, PVD, sinus bradycardia s/p pacemaker, and insulin-dependent type 2 diabetes who presented with hypotension and admitted for sepsis and malnutrition.   Overnight Events: none  She was awake and able to answer a few questions. She was having back pain from sacral wound. Has feeding tube in place.   Discussed with her sister and brother over phone today about [name]'s progress. Patient is still confused and not answering most questions. They said that if she was alert and oriented, she would have the surgery done to take care of the infection.    Objective:  Vital signs in last 24 hours: Vitals:   03/27/22 2345 03/28/22 0400 03/28/22 0747 03/28/22 1139  BP: 139/65 (!) 146/74 137/67 (!) 142/76  Pulse: 76 90 75 74  Resp: 14 20 15 12   Temp: 97.8 F (36.6 C) 97.8 F (36.6 C) 97.8 F (36.6 C) 98.1 F (36.7 C)  TempSrc: Oral Oral Oral Oral  SpO2: 94% 95% 94%   Weight:      Height:         Physical Exam:  Constitutional: awake, in no acute distress HENT: normocephalic atraumatic Neck: supple Cardiovascular: regular rate and rhythm Extremities: Bilateral BKA, right BKA residual limb has ulcerated wound with clean bandage Neurological: confused Skin: warm and dry Psych: normal mood  Filed Weights   03/22/22 1337 03/22/22 1915 03/26/22 0500  Weight: 85.5 kg 85 kg 70 kg     Intake/Output Summary (Last 24 hours) at 03/28/2022 1613 Last data filed at 03/28/2022 0600 Gross per 24 hour  Intake 764 ml  Output 2300 ml  Net -1536 ml    Net IO Since Admission: -6,587.54 mL [03/28/22 1613]  Pertinent Labs:    Latest Ref Rng & Units 03/28/2022    5:21 AM 03/27/2022   11:01 PM 03/27/2022    8:47 AM  CBC  WBC 4.0 - 10.5 K/uL 7.3   6.3   Hemoglobin 12.0 - 15.0 g/dL 05/27/2022  15.0  7.7   Hematocrit 36.0 - 46.0 % 34.5  35.1  25.5   Platelets 150 - 400  K/uL 236   324        Latest Ref Rng & Units 03/28/2022    5:21 AM 03/27/2022    8:47 AM 03/26/2022    7:57 PM  CMP  Glucose 70 - 99 mg/dL 05/26/2022  85  794   BUN 6 - 20 mg/dL 22  18  22    Creatinine 0.44 - 1.00 mg/dL 801   6.55   Sodium 135 - 145 mmol/L 138  141  140   Potassium 3.5 - 5.1 mmol/L 4.0  4.0  4.5   Chloride 98 - 111 mmol/L 112  112  116   CO2 22 - 32 mmol/L 19  22  20    Calcium 8.9 - 10.3 mg/dL 9.5  9.9  9.4     Imaging: No results found.  Assessment/Plan:   Principal Problem:   AKI (acute kidney injury) (HCC) Active Problems:   Uncontrolled type 2 diabetes mellitus with hyperglycemia (HCC)   Dehiscence of amputation stump of right lower extremity (HCC)   Sacral wound   Severe protein-calorie malnutrition (HCC)   Patient Summary: [name] is a 61 y.o. with a pertinent PMH of HFpEF, COPD, GERD, HLD, HTN, hypothyroidism, OSA on CPAP, PVD, sinus bradycardia s/p pacemaker,  and insulin-dependent type 2 diabetes who presented with hypotension and admitted for sepsis and malnutrition.   Hypotension Sepsis vs malnutrition. MAPs above 70. Feeding tube placed today. BC showed no growth. On IV antibiotics.  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 Right LE ulceration with bony exposure. Palliative care talked with family and they want full scope of care including surgery for stump revision. Ortho plan for right AKA for Wednesday, received 2 units PRBC. Hgb 11.6.  -continue antibiotics per above -continue wound care -surgery for Wednesday, will be NPO, held lovenox and plavix  AKI on CKD 3B Chronic Foley catheter Baseline creatinine of 1.5 from prior hospitalization. AKI improving since admission. Likely consider prerenal etiology from hypotension and poor p.o. intake.  -BMP -strict  I/O -Continue foley care -if worsening renal function, consider nephrology referral  Chronic sacral wound She reports chronic pain from the sacral wound. Wound care is following. Concern for poor wound healing given malnutrition and poor glycemic control.  Appreciate wound care recs.  -Continue wound care -Continue home Tylenol and oxycodone pain regimen  Malnutrition Feeding tube in place. 1000 ml at 40 ml/hr continuous today. Goal is 50 ml/hr.  -SSI  Hypokalemia Potassium steady at 4.  Magnesium was 1.9. Continue to monitor. -BMP  Episodic Hypoglycemia Insulin-dependent type 2 diabetes mellitus No recent overnight hypoglycemia. C-peptide was 14.4, insulin 34.2, which is endogenous response after dextrose administration. Now that she has feeding tube, no overnight hypoglycemia noted. Episodic hypoglycemia could be from poor po intake.  -CBG every 2 hours  HFpEF 2017 Echo showed EF of 65-70%.    Normocytic anemia  Anemia of chronic disease in setting of multiple chronic wounds. Surgery planned for Wednesday so transfused 2 units PRBC. Hgb increased to 11.6. -CBC  Hx of hypertension Hold lisinopril and carvedilol in setting of hypotension and AKI  Hypothyroidism TSH was 0.920 today. Will continue levothyroxine.  HLD Continue home rosuvastatin 40mg .  OSA CPAP qhs  GERD Continue Protonix 40 mg daily.  Diet: Feeding Tube (40 ml/hr continuous) IVF: None,None VTE: Enoxaparin (hold for surgery) Code: Full   Dispo: Anticipated discharge to nursing facility pending medically stable.   , DO Internal Medicine Resident PGY-1 Please contact the on call pager after 5 pm and on weekends at [phone].

## 2022-03-28 NOTE — Progress Notes (Signed)
IT notified of non working scanner in pt room.

## 2022-03-28 NOTE — Progress Notes (Signed)
Patient ID: [name], female   DOB: 05/10/1961, 60 y.o.   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  Patient states that she would like to proceed with an above-the-knee amputation.  Risk and benefits were discussed including risk of the wound not healing need for additional surgery.  Patient states she understands wished to proceed at this time.  Plan for surgery Wednesday. 

## 2022-03-29 ENCOUNTER — Encounter (HOSPITAL_COMMUNITY)
Admission: EM | Disposition: A | Discharge status: Hospice | Payer: Self-pay | Source: Skilled Nursing Facility | Attending: Internal Medicine

## 2022-03-29 ENCOUNTER — Other Ambulatory Visit: Payer: Self-pay

## 2022-03-29 ENCOUNTER — Inpatient Hospital Stay (HOSPITAL_COMMUNITY): Payer: Medicare HMO | Admitting: Anesthesiology

## 2022-03-29 ENCOUNTER — Encounter (HOSPITAL_COMMUNITY): Payer: Self-pay | Admitting: Internal Medicine

## 2022-03-29 DIAGNOSIS — E1165 Type 2 diabetes mellitus with hyperglycemia: Secondary | ICD-10-CM | POA: Diagnosis not present

## 2022-03-29 DIAGNOSIS — T8781 Dehiscence of amputation stump: Secondary | ICD-10-CM | POA: Diagnosis not present

## 2022-03-29 DIAGNOSIS — N1832 Chronic kidney disease, stage 3b: Secondary | ICD-10-CM | POA: Diagnosis not present

## 2022-03-29 DIAGNOSIS — N179 Acute kidney failure, unspecified: Secondary | ICD-10-CM | POA: Diagnosis not present

## 2022-03-29 DIAGNOSIS — M869 Osteomyelitis, unspecified: Secondary | ICD-10-CM

## 2022-03-29 DIAGNOSIS — E43 Unspecified severe protein-calorie malnutrition: Secondary | ICD-10-CM | POA: Diagnosis not present

## 2022-03-29 DIAGNOSIS — E1169 Type 2 diabetes mellitus with other specified complication: Secondary | ICD-10-CM | POA: Diagnosis not present

## 2022-03-29 DIAGNOSIS — I959 Hypotension, unspecified: Secondary | ICD-10-CM | POA: Diagnosis not present

## 2022-03-29 DIAGNOSIS — Z7984 Long term (current) use of oral hypoglycemic drugs: Secondary | ICD-10-CM | POA: Diagnosis not present

## 2022-03-29 DIAGNOSIS — Z794 Long term (current) use of insulin: Secondary | ICD-10-CM

## 2022-03-29 DIAGNOSIS — L89159 Pressure ulcer of sacral region, unspecified stage: Secondary | ICD-10-CM | POA: Diagnosis not present

## 2022-03-29 HISTORY — PX: AMPUTATION: SHX166

## 2022-03-29 LAB — PHOSPHORUS
Phosphorus: 3 mg/dL (ref 2.5–4.6)
Phosphorus: 3.3 mg/dL (ref 2.5–4.6)

## 2022-03-29 LAB — SURGICAL PCR SCREEN
MRSA, PCR: NEGATIVE
Staphylococcus aureus: NEGATIVE

## 2022-03-29 LAB — BASIC METABOLIC PANEL
Anion gap: 10 (ref 5–15)
BUN: 27 mg/dL — ABNORMAL HIGH (ref 6–20)
CO2: 19 mmol/L — ABNORMAL LOW (ref 22–32)
Calcium: 9.4 mg/dL (ref 8.9–10.3)
Chloride: 107 mmol/L (ref 98–111)
Creatinine, Ser: 1.67 mg/dL — ABNORMAL HIGH (ref 0.44–1.00)
GFR, Estimated: 35 mL/min — ABNORMAL LOW (ref >60)
Glucose, Bld: 240 mg/dL — ABNORMAL HIGH (ref 70–99)
Potassium: 4.2 mmol/L (ref 3.5–5.1)
Sodium: 136 mmol/L (ref 135–145)

## 2022-03-29 LAB — MAGNESIUM
Magnesium: 2.1 mg/dL (ref 1.7–2.4)
Magnesium: 2 mg/dL (ref 1.7–2.4)

## 2022-03-29 LAB — CBC
HCT: 36 % (ref 36.0–46.0)
Hemoglobin: 11.9 g/dL — ABNORMAL LOW (ref 12.0–15.0)
MCH: 29 pg (ref 26.0–34.0)
MCHC: 33.1 g/dL (ref 30.0–36.0)
MCV: 87.8 fL (ref 80.0–100.0)
Platelets: 207 10*3/uL (ref 150–400)
RBC: 4.1 MIL/uL (ref 3.87–5.11)
RDW: 16.7 % — ABNORMAL HIGH (ref 11.5–15.5)
WBC: 7.2 10*3/uL (ref 4.0–10.5)
nRBC: 0 % (ref 0.0–0.2)

## 2022-03-29 LAB — GLUCOSE, CAPILLARY
Glucose-Capillary: 148 mg/dL — ABNORMAL HIGH (ref 70–99)
Glucose-Capillary: 180 mg/dL — ABNORMAL HIGH (ref 70–99)
Glucose-Capillary: 188 mg/dL — ABNORMAL HIGH (ref 70–99)
Glucose-Capillary: 211 mg/dL — ABNORMAL HIGH (ref 70–99)
Glucose-Capillary: 244 mg/dL — ABNORMAL HIGH (ref 70–99)
Glucose-Capillary: 246 mg/dL — ABNORMAL HIGH (ref 70–99)
Glucose-Capillary: 279 mg/dL — ABNORMAL HIGH (ref 70–99)

## 2022-03-29 LAB — VITAMIN C: Vitamin C: 1.3 mg/dL (ref 0.4–2.0)

## 2022-03-29 LAB — VANCOMYCIN, RANDOM: Vancomycin Rm: 29 ug/mL

## 2022-03-29 SURGERY — AMPUTATION, ABOVE KNEE
Anesthesia: General | Site: Knee | Laterality: Right

## 2022-03-29 MED ORDER — BISACODYL 5 MG PO TBEC: 5.0000 mg | DELAYED_RELEASE_TABLET | Freq: Every day | ORAL | Status: DC | PRN

## 2022-03-29 MED ORDER — LACTATED RINGERS IV SOLN: INTRAVENOUS | Status: DC | PRN

## 2022-03-29 MED ORDER — JUVEN PO PACK
1.0000 | PACK | Freq: Two times a day (BID) | ORAL | Status: DC
  Administered 2022-03-30 – 2022-04-08 (×18): 1 via ORAL
  Filled 2022-03-29 (×16): qty 1

## 2022-03-29 MED ORDER — MAGNESIUM CITRATE PO SOLN: 1.0000 | Freq: Once | ORAL | Status: DC | PRN

## 2022-03-29 MED ORDER — LABETALOL HCL 5 MG/ML IV SOLN: 10.0000 mg | INTRAVENOUS | Status: DC | PRN

## 2022-03-29 MED ORDER — LIDOCAINE 2% (20 MG/ML) 5 ML SYRINGE
INTRAMUSCULAR | Status: AC
  Filled 2022-03-29: qty 5

## 2022-03-29 MED ORDER — ONDANSETRON HCL 4 MG/2ML IJ SOLN: 4.0000 mg | Freq: Four times a day (QID) | INTRAMUSCULAR | Status: DC | PRN

## 2022-03-29 MED ORDER — GUAIFENESIN-DM 100-10 MG/5ML PO SYRP: 15.0000 mL | ORAL_SOLUTION | ORAL | Status: DC | PRN

## 2022-03-29 MED ORDER — ALUM & MAG HYDROXIDE-SIMETH 200-200-20 MG/5ML PO SUSP: 15.0000 mL | ORAL | Status: DC | PRN

## 2022-03-29 MED ORDER — POTASSIUM CHLORIDE CRYS ER 20 MEQ PO TBCR: 20.0000 meq | EXTENDED_RELEASE_TABLET | Freq: Every day | ORAL | Status: DC | PRN

## 2022-03-29 MED ORDER — POLYETHYLENE GLYCOL 3350 17 G PO PACK
17.0000 g | PACK | Freq: Every day | ORAL | Status: DC
Start: 2022-03-29 — End: 2022-04-03
  Administered 2022-03-29 – 2022-04-03 (×4): 17 g
  Filled 2022-03-29 (×5): qty 1

## 2022-03-29 MED ORDER — POLYETHYLENE GLYCOL 3350 17 G PO PACK
17.0000 g | PACK | Freq: Every day | ORAL | Status: DC | PRN
  Administered 2022-03-31: 17 g via ORAL

## 2022-03-29 MED ORDER — HYDRALAZINE HCL 20 MG/ML IJ SOLN: 5.0000 mg | INTRAMUSCULAR | Status: DC | PRN

## 2022-03-29 MED ORDER — ACETAMINOPHEN 325 MG PO TABS
325.0000 mg | ORAL_TABLET | Freq: Four times a day (QID) | ORAL | Status: DC | PRN
  Administered 2022-03-30: 650 mg via ORAL
  Filled 2022-03-29: qty 2

## 2022-03-29 MED ORDER — HYDROCODONE-ACETAMINOPHEN 7.5-325 MG PO TABS
1.0000 | ORAL_TABLET | ORAL | Status: DC | PRN
  Administered 2022-04-02: 1 via ORAL
  Filled 2022-03-29: qty 1

## 2022-03-29 MED ORDER — PANTOPRAZOLE SODIUM 40 MG PO TBEC: 40.0000 mg | DELAYED_RELEASE_TABLET | Freq: Every day | ORAL | Status: DC

## 2022-03-29 MED ORDER — HYDROCODONE-ACETAMINOPHEN 5-325 MG PO TABS
1.0000 | ORAL_TABLET | ORAL | Status: DC | PRN
  Administered 2022-04-01: 1 via ORAL
  Filled 2022-03-29: qty 1

## 2022-03-29 MED ORDER — ZINC SULFATE 220 (50 ZN) MG PO CAPS
220.0000 mg | ORAL_CAPSULE | Freq: Every day | ORAL | Status: DC
  Administered 2022-03-29 – 2022-04-08 (×11): 220 mg via ORAL
  Filled 2022-03-29 (×12): qty 1

## 2022-03-29 MED ORDER — FENTANYL CITRATE (PF) 100 MCG/2ML IJ SOLN
INTRAMUSCULAR | Status: DC | PRN
  Administered 2022-03-29: 25 ug via INTRAVENOUS

## 2022-03-29 MED ORDER — PHENOL 1.4 % MT LIQD: 1.0000 | OROMUCOSAL | Status: DC | PRN

## 2022-03-29 MED ORDER — VANCOMYCIN HCL IN DEXTROSE 1-5 GM/200ML-% IV SOLN: 1000.0000 mg | INTRAVENOUS | Status: DC

## 2022-03-29 MED ORDER — CHLORHEXIDINE GLUCONATE CLOTH 2 % EX PADS: 6.0000 | MEDICATED_PAD | Freq: Once | CUTANEOUS | Status: DC

## 2022-03-29 MED ORDER — SENNA 8.6 MG PO TABS
1.0000 | ORAL_TABLET | Freq: Every day | ORAL | Status: DC
Start: 2022-03-29 — End: 2022-04-03
  Administered 2022-03-29 – 2022-04-03 (×5): 8.6 mg
  Filled 2022-03-29 (×6): qty 1

## 2022-03-29 MED ORDER — ONDANSETRON HCL 4 MG/2ML IJ SOLN
INTRAMUSCULAR | Status: DC | PRN
  Administered 2022-03-29: 4 mg via INTRAVENOUS

## 2022-03-29 MED ORDER — HYDROMORPHONE HCL 1 MG/ML IJ SOLN: 0.2500 mg | INTRAMUSCULAR | Status: DC | PRN

## 2022-03-29 MED ORDER — METOPROLOL TARTRATE 5 MG/5ML IV SOLN: 2.0000 mg | INTRAVENOUS | Status: DC | PRN

## 2022-03-29 MED ORDER — ACETAMINOPHEN 10 MG/ML IV SOLN
INTRAVENOUS | Status: AC
  Administered 2022-03-29: 1000 mg via INTRAVENOUS
  Filled 2022-03-29: qty 100

## 2022-03-29 MED ORDER — TRANEXAMIC ACID 1000 MG/10ML IV SOLN
2000.0000 mg | INTRAVENOUS | Status: DC
  Filled 2022-03-29: qty 20

## 2022-03-29 MED ORDER — ACETAMINOPHEN 10 MG/ML IV SOLN: 1000.0000 mg | Freq: Once | INTRAVENOUS | Status: AC

## 2022-03-29 MED ORDER — PROPOFOL 10 MG/ML IV BOLUS
INTRAVENOUS | Status: AC
  Filled 2022-03-29: qty 20

## 2022-03-29 MED ORDER — DOCUSATE SODIUM 100 MG PO CAPS
100.0000 mg | ORAL_CAPSULE | Freq: Every day | ORAL | Status: DC
Start: 2022-03-30 — End: 2022-04-03
  Administered 2022-03-30 – 2022-04-03 (×2): 100 mg via ORAL
  Filled 2022-03-29 (×2): qty 1

## 2022-03-29 MED ORDER — MAGNESIUM SULFATE 2 GM/50ML IV SOLN: 2.0000 g | Freq: Every day | INTRAVENOUS | Status: DC | PRN

## 2022-03-29 MED ORDER — AMISULPRIDE (ANTIEMETIC) 5 MG/2ML IV SOLN
10.0000 mg | Freq: Once | INTRAVENOUS | Status: DC | PRN
Start: 2022-03-29 — End: 2022-03-29

## 2022-03-29 MED ORDER — PROPOFOL 10 MG/ML IV BOLUS
INTRAVENOUS | Status: DC | PRN
  Administered 2022-03-29: 90 mg via INTRAVENOUS

## 2022-03-29 MED ORDER — 0.9 % SODIUM CHLORIDE (POUR BTL) OPTIME
TOPICAL | Status: DC | PRN
  Administered 2022-03-29: 1000 mL

## 2022-03-29 MED ORDER — FENTANYL CITRATE (PF) 250 MCG/5ML IJ SOLN
INTRAMUSCULAR | Status: AC
  Filled 2022-03-29: qty 5

## 2022-03-29 MED ORDER — VANCOMYCIN HCL IN DEXTROSE 1-5 GM/200ML-% IV SOLN
INTRAVENOUS | Status: AC
  Filled 2022-03-29: qty 200

## 2022-03-29 MED ORDER — PROSOURCE TF20 ENFIT COMPATIBL EN LIQD
60.0000 mL | Freq: Every day | ENTERAL | Status: DC
  Administered 2022-03-29 – 2022-03-31 (×3): 60 mL
  Filled 2022-03-29 (×4): qty 60

## 2022-03-29 MED ORDER — MORPHINE SULFATE (PF) 2 MG/ML IV SOLN: 0.5000 mg | INTRAVENOUS | Status: DC | PRN

## 2022-03-29 MED ORDER — SUCCINYLCHOLINE CHLORIDE 200 MG/10ML IV SOSY
PREFILLED_SYRINGE | INTRAVENOUS | Status: AC
  Filled 2022-03-29: qty 10

## 2022-03-29 MED ORDER — SODIUM CHLORIDE 0.9 % IV SOLN: INTRAVENOUS | Status: DC

## 2022-03-29 MED ORDER — ONDANSETRON HCL 4 MG/2ML IJ SOLN
4.0000 mg | Freq: Once | INTRAMUSCULAR | Status: DC | PRN
Start: 2022-03-29 — End: 2022-03-29

## 2022-03-29 MED ORDER — ASCORBIC ACID 500 MG PO TABS
1000.0000 mg | ORAL_TABLET | Freq: Every day | ORAL | Status: DC
  Administered 2022-03-29 – 2022-04-08 (×11): 1000 mg via ORAL
  Filled 2022-03-29 (×11): qty 2

## 2022-03-29 MED ORDER — SUCCINYLCHOLINE CHLORIDE 200 MG/10ML IV SOSY
PREFILLED_SYRINGE | INTRAVENOUS | Status: DC | PRN
  Administered 2022-03-29: 80 mg via INTRAVENOUS

## 2022-03-29 SURGICAL SUPPLY — 45 items
BAG COUNTER SPONGE SURGICOUNT (BAG) IMPLANT
BLADE SAW RECIP 87.9 MT (BLADE) ×2 IMPLANT
BLADE SURG 21 STRL SS (BLADE) ×2 IMPLANT
BNDG COHESIVE 6X5 TAN STRL LF (GAUZE/BANDAGES/DRESSINGS) ×2 IMPLANT
CANISTER WOUND CARE 500ML ATS (WOUND CARE) ×1 IMPLANT
COVER SURGICAL LIGHT HANDLE (MISCELLANEOUS) ×2 IMPLANT
CUFF TOURN SGL QUICK 34 (TOURNIQUET CUFF)
CUFF TRNQT CYL 34X4.125X (TOURNIQUET CUFF) IMPLANT
DRAPE DERMATAC (DRAPES) ×2 IMPLANT
DRAPE INCISE IOBAN 66X45 STRL (DRAPES) ×3 IMPLANT
DRAPE U-SHAPE 47X51 STRL (DRAPES) ×2 IMPLANT
DRESSING PREVENA PLUS CUSTOM (GAUZE/BANDAGES/DRESSINGS) ×1 IMPLANT
DRSG PREVENA PLUS CUSTOM (GAUZE/BANDAGES/DRESSINGS) ×2
DURAPREP 26ML APPLICATOR (WOUND CARE) ×2 IMPLANT
ELECT REM PT RETURN 9FT ADLT (ELECTROSURGICAL) ×2
ELECTRODE REM PT RTRN 9FT ADLT (ELECTROSURGICAL) ×1 IMPLANT
GLOVE BIOGEL PI IND STRL 7.5 (GLOVE) ×1 IMPLANT
GLOVE BIOGEL PI IND STRL 9 (GLOVE) ×1 IMPLANT
GLOVE BIOGEL PI INDICATOR 7.5 (GLOVE) ×2
GLOVE BIOGEL PI INDICATOR 9 (GLOVE) ×2
GLOVE SURG ORTHO 9.0 STRL STRW (GLOVE) ×2 IMPLANT
GLOVE SURG SS PI 6.5 STRL IVOR (GLOVE) ×2 IMPLANT
GOWN STRL REUS W/ TWL LRG LVL3 (GOWN DISPOSABLE) ×1 IMPLANT
GOWN STRL REUS W/ TWL XL LVL3 (GOWN DISPOSABLE) ×2 IMPLANT
GOWN STRL REUS W/TWL LRG LVL3 (GOWN DISPOSABLE) ×2
GOWN STRL REUS W/TWL XL LVL3 (GOWN DISPOSABLE) ×4
GRAFT SKIN WND MICRO 38 (Tissue) ×1 IMPLANT
KIT BASIN OR (CUSTOM PROCEDURE TRAY) ×2 IMPLANT
KIT TURNOVER KIT B (KITS) ×2 IMPLANT
MANIFOLD NEPTUNE II (INSTRUMENTS) ×2 IMPLANT
NS IRRIG 1000ML POUR BTL (IV SOLUTION) ×2 IMPLANT
PACK ORTHO EXTREMITY (CUSTOM PROCEDURE TRAY) ×2 IMPLANT
PAD ARMBOARD 7.5X6 YLW CONV (MISCELLANEOUS) ×2 IMPLANT
PREVENA RESTOR ARTHOFORM 46X30 (CANNISTER) ×1 IMPLANT
Prevena customizable dressing IMPLANT
STAPLER VISISTAT 35W (STAPLE) ×1 IMPLANT
STOCKINETTE IMPERVIOUS LG (DRAPES) IMPLANT
SUT ETHILON 2 0 PSLX (SUTURE) ×2 IMPLANT
SUT SILK 2 0 (SUTURE) ×2
SUT SILK 2-0 18XBRD TIE 12 (SUTURE) ×1 IMPLANT
SUT VIC AB 1 CT1 27 (SUTURE) ×4
SUT VIC AB 1 CT1 27XBRD ANBCTR (SUTURE) IMPLANT
TOWEL GREEN STERILE FF (TOWEL DISPOSABLE) ×2 IMPLANT
TUBE CONNECTING 20X1/4 (TUBING) ×2 IMPLANT
YANKAUER SUCT BULB TIP NO VENT (SUCTIONS) ×2 IMPLANT

## 2022-03-29 NOTE — Progress Notes (Signed)
Pharmacy Antibiotic Note- follow-up  [name] is a 61 y.o. female admitted on 03/22/2022 presenting from facility with leg and sacral wounds, concern for sepsis.  Pharmacy was been consulted for vancomycin and cefepime dosing.  Vanco random level reported as 29 mcg/mL this morning. Patients renal function improved some today and she remains afebrile.   Plan: Hold vancomycin today, 8/9.  RAKA planned today 8/9 with Dr. Lajoyce Corners   Continue Cefepime 2g IV every 24 hours - dose appropriate for indication and renal function.  Monitor renal function, Cx and clinical progression to narrow  F/u abx plan s/p AKA  Micro's 8/2 Bcx- NG x 4 days   Height: 5\' 5"  (165.1 cm) Weight: 98 kg (216 lb 0.8 oz) IBW/kg (Calculated) : 57  Temp (24hrs), Avg:98.4 F (36.9 C), Min:97.8 F (36.6 C), Max:98.9 F (37.2 C)  Recent Labs  Lab  0000 03/22/22 1354 03/22/22 1548 03/23/22 0458 03/23/22 0700 03/23/22 0941 03/24/22 0446 03/25/22 0313 03/26/22 0233 03/26/22 1957 03/27/22 0847 03/28/22 0521 03/29/22 0503  WBC  --   --   --    < >  --   --    < > 5.6 5.5  --  6.3 7.3 7.2  CREATININE  --   --   --    < >  --   --    < > 2.13* 2.14* 1.87* 1.74* 1.72* 1.67*  LATICACIDVEN  --  1.6 1.0  --  0.8 1.1  --   --   --   --   --   --   --   VANCORANDOM   < >  --   --   --   --  25  --  16 28  --  22  --  29   < > = values in this interval not displayed.     Estimated Creatinine Clearance: 41.5 mL/min (A) (by C-G formula based on SCr of 1.67 mg/dL (H)).    Allergies  Allergen Reactions   Penicillins Hives    Has patient had a PCN reaction causing immediate rash, facial/tongue/throat swelling, SOB or lightheadedness with hypotension: No Has patient had a PCN reaction causing severe rash involving mucus membranes or skin necrosis: No Has patient had a PCN reaction that required hospitalization No Has patient had a PCN reaction occurring within the last 10 years: No If all of the above answers are  "NO", then may proceed with Cephalosporin use.    Dulaglutide Diarrhea    Trulicity- "Allergic," per MAR   Tositumomab Other (See Comments)    "Allergic," per MAR   Erythromycin Diarrhea and Other (See Comments)    "Allergic," per MAR   Gadolinium Nausea And Vomiting and Other (See Comments)     Code: VOM, Desc: Pt began vomiting immed post infusion of multihance, Onset Date: 05/29/22 "Allergic," per MAR    Iodine-131 Nausea And Vomiting and Other (See Comments)    "Allergic," per MAR   Ivp Dye [Iodinated Contrast Media] Nausea And Vomiting and Other (See Comments)    "Allergic," per MAR    Metformin Diarrhea and Other (See Comments)    "Allergic," per MAR    Audree Schrecengost A. [phone], PharmD, BCPS, FNKF Clinical Pharmacist [place] Please utilize Amion for appropriate phone number to reach the unit pharmacist [facility])  --------------------------------------------------------------------------------------------------------------------

## 2022-03-29 NOTE — Progress Notes (Signed)
Glucerna given at 0430 as ordered.

## 2022-03-29 NOTE — Progress Notes (Signed)
Inpatient Rehabilitation Admissions Coordinator   Rehab consult received. Patient from [facility] where she has been since 02/14/22. Noted by [facility] that discharge plans are to return to SNF at discharge. If that plan changes to return home with caregiver supports, we could then pursue possible CIR pending her therapy evaluations. Please advise.  Ottie Glazier, RN, MSN Rehab Admissions Coordinator [phone] 03/29/2022 3:57 PM

## 2022-03-29 NOTE — Transfer of Care (Signed)
Immediate Anesthesia Transfer of Care Note  Patient: [name]  Procedure(s) Performed: RIGHT ABOVE KNEE AMPUTATION (Right: Knee)  Patient Location: PACU  Anesthesia Type:General  Level of Consciousness: drowsy and patient cooperative  Airway & Oxygen Therapy: Patient Spontanous Breathing  Post-op Assessment: Report given to RN and Post -op Vital signs reviewed and stable  Post vital signs: Reviewed and stable  Last Vitals:  Vitals Value Taken Time  BP 102/65 03/29/22 1418  Temp    Pulse 64 03/29/22 1420  Resp 15 03/29/22 1420  SpO2 99 % 03/29/22 1420  Vitals shown include unvalidated device data.  Last Pain:  Vitals:   03/29/22 1256  TempSrc: Oral  PainSc:       Patients Stated Pain Goal: 0 (03/23/22 1408)  Complications: No notable events documented.

## 2022-03-29 NOTE — Op Note (Signed)
03/29/2022  2:40 PM  PATIENT:  [name]    PRE-OPERATIVE DIAGNOSIS:  osteomyelitis right below knee amputation  POST-OPERATIVE DIAGNOSIS:  Same  PROCEDURE:  RIGHT ABOVE KNEE AMPUTATION Application of Kerecis tissue graft 38 cm.  Wound measurement 10 x 20 cm. Application of Prevena customizable wound VAC.  SURGEON:  Nadara Mustard, MD  ANESTHESIA:   General  PREOPERATIVE INDICATIONS:  [name] is a  61 y.o. female with a diagnosis of osteomyelitis right below knee amputation who failed conservative measures and elected for surgical management.    The risks benefits and alternatives were discussed with the patient preoperatively including but not limited to the risks of infection, bleeding, nerve injury, cardiopulmonary complications, the need for revision surgery, among others, and the patient was willing to proceed.  OPERATIVE IMPLANTS: Kerecis micro powder 38 cm.   OPERATIVE FINDINGS: Good muscle color and contractility.  OPERATIVE PROCEDURE: Patient was brought to the operating room and underwent a general anesthetic.  After adequate levels anesthesia were obtained patient's right lower extremity was prepped using DuraPrep draped into a sterile field a timeout was called.  The necrotic residual limb was draped out of the sterile field with Ioban.  A fishmouth incision was made just proximal to the patella.  This was carried down to the vascular bundle medially this was clamped and suture-ligated with 2-0 silk.  The remainder of the amputation was completed these bone was resected with a reciprocating saw.  Electrocautery was used for further hemostasis.  Kerecis micro powder was applied to the wound that was 10 x 20 cm.  The deep and superficial fascial layers were closed over the bone with #1 Vicryl.  Skin was closed using staples.  A customizable Prevena wound VAC was applied this had a good suction fit patient was extubated taken the PACU in stable condition. DISCHARGE  PLANNING:  Antibiotic duration: 24-hour antibiotics  Weightbearing: Nonweightbearing on the operative extremity  Pain medication: Opioid pathway  Dressing care/ Wound VAC: Continue wound VAC with the Prevena plus pump at discharge for 1 week  Ambulatory devices: Walker or kneeling scooter  Discharge to: Discharge planning based on recommendations per physical therapy  Follow-up: In the office 1 week after discharge.

## 2022-03-29 NOTE — Interval H&P Note (Signed)
History and Physical Interval Note:  03/29/2022 6:33 AM  [name]  has presented today for surgery, with the diagnosis of osteomyelitis right below knee amputation.  The various methods of treatment have been discussed with the patient and family. After consideration of risks, benefits and other options for treatment, the patient has consented to  Procedure(s): RIGHT ABOVE KNEE AMPUTATION (Right) as a surgical intervention.  The patient's history has been reviewed, patient examined, no change in status, stable for surgery.  I have reviewed the patient's chart and labs.  Questions were answered to the patient's satisfaction.     Nadara Mustard

## 2022-03-29 NOTE — Anesthesia Preprocedure Evaluation (Signed)
Anesthesia Evaluation  Patient identified by MRN, date of birth, ID band Patient awake    Reviewed: Allergy & Precautions, NPO status , Patient's Chart, lab work & pertinent test results, reviewed documented beta blocker date and time   History of Anesthesia Complications (+) history of anesthetic complications  Airway Mallampati: III  TM Distance: >3 FB     Dental  (+) Edentulous Lower, Edentulous Upper   Pulmonary sleep apnea and Continuous Positive Airway Pressure Ventilation , COPD,  COPD inhaler and oxygen dependent, Current Smoker and Patient abstained from smoking.,    Pulmonary exam normal breath sounds clear to auscultation       Cardiovascular hypertension, Pt. on medications + Peripheral Vascular Disease and +CHF  Normal cardiovascular exam+ pacemaker  Rhythm:Regular Rate:Normal     Neuro/Psych  Headaches, PSYCHIATRIC DISORDERS Anxiety Depression Diabetic peripheral neuropathy TIA Neuromuscular disease    GI/Hepatic Neg liver ROS, GERD  ,  Endo/Other  diabetes, Poorly Controlled, Type 2, Insulin Dependent, Oral Hypoglycemic AgentsHypothyroidism Obesity  Hyperlipidemia  Renal/GU Renal InsufficiencyRenal disease  negative genitourinary   Musculoskeletal  (+) Arthritis , Osteomyelitis right BKA   Abdominal (+) + obese,   Peds  Hematology  (+) Blood dyscrasia, anemia ,   Anesthesia Other Findings   Reproductive/Obstetrics                             Anesthesia Physical Anesthesia Plan  ASA: 4  Anesthesia Plan: General   Post-op Pain Management:    Induction: Intravenous  PONV Risk Score and Plan: 3 and Treatment may vary due to age or medical condition and Ondansetron  Airway Management Planned: LMA and Oral ETT  Additional Equipment: None  Intra-op Plan:   Post-operative Plan: Extubation in OR  Informed Consent: I have reviewed the patients History and Physical,  chart, labs and discussed the procedure including the risks, benefits and alternatives for the proposed anesthesia with the patient or authorized representative who has indicated his/her understanding and acceptance.     Dental advisory given  Plan Discussed with: CRNA, Surgeon and Anesthesiologist  Anesthesia Plan Comments:         Anesthesia Quick Evaluation

## 2022-03-29 NOTE — Progress Notes (Signed)
Pt is s/p AKA. 24 hrs post op abx. Plan is of vanc in the system>>no further doses. Pt has received cefepime dose for today so this will provide 24 hrs coverage.  Ulyses Southward, PharmD, BCIDP, AAHIVP, CPP Infectious Disease Pharmacist 03/29/2022 4:46 PM

## 2022-03-29 NOTE — Anesthesia Postprocedure Evaluation (Signed)
Anesthesia Post Note  Patient: [name]  Procedure(s) Performed: RIGHT ABOVE KNEE AMPUTATION (Right: Knee)     Patient location during evaluation: PACU Anesthesia Type: General Level of consciousness: awake and alert and oriented Pain management: pain level controlled Vital Signs Assessment: post-procedure vital signs reviewed and stable Respiratory status: spontaneous breathing, nonlabored ventilation and respiratory function stable Cardiovascular status: blood pressure returned to baseline and stable Postop Assessment: no apparent nausea or vomiting Anesthetic complications: no   No notable events documented.  Last Vitals:  Vitals:   03/29/22 1430 03/29/22 1500  BP: 131/68 (!) 145/66  Pulse: 60 62  Resp: 17 16  Temp:  36.6 C  SpO2: 99% 97%    Last Pain:  Vitals:   03/29/22 1430  TempSrc:   PainSc: Asleep                 Mandell Pangborn A.

## 2022-03-29 NOTE — Progress Notes (Signed)
Nutrition Follow-up  DOCUMENTATION CODES:   Not applicable  INTERVENTION:   Tube feeds via Cortrak: Continue Jevity 1.5 at 50 mL/hr (1200 mL/day) 60 mL ProSource TF 20 - Daily 150 mL free water flush q4h Provides 1880 kcal, 97 gm of protein, and 1812 mL total free water daily. Continue Multivitamin w/ minerals daily Discontinue Ensure Enlive  Recommend starting a bowel regimen.   NUTRITION DIAGNOSIS:   Increased nutrient needs related to wound healing as evidenced by estimated needs. - Ongoing   GOAL:   Patient will meet greater than or equal to 90% of their needs - Ongoing   MONITOR:   PO intake, Supplement acceptance, Labs, TF tolerance, I & O's  REASON FOR ASSESSMENT:   Malnutrition Screening Tool, Consult Assessment of nutrition requirement/status  ASSESSMENT:   Pt admitted with hypotension, found to have AKI. PMH significant for CHF, COPD, GERD, HLD, HTN, hypothyroidism, OSA on CPAP, PVD, sinus bradycardia s/p PPM, T2DM. Recent admission 6/18-6/27 for sacral wound requiring I&D.   8/07 - Cortrak placed (tip antrum or pyloric region)  Pt in OR for procedure. Discussed case with RN. RN reports that she tried to feed the pt yesterday and pt did not eat much. States that pt is missing teeth and attempted to "gum" the food the best she could. Reports that pt drank ~1/2 an Ensure in the morning and did not have any in the afternoon. RN denies any signs of intolerance to TF at this time.   RD to adjust TF regimen due to change in protein modular formulary. Would consider adding bowel regimen due to no BM x5 days; reached out to MD team.   Medications reviewed and include: NovoLog, MVI, Protonix, IV antibiotics Labs reviewed: BUN 27, Creatinine 1.67, 24 hr CBG 44-191  Diet Order:   Diet Order             Diet NPO time specified  Diet effective ____                   EDUCATION NEEDS:   Not appropriate for education at this time  Skin:  Skin Assessment: Skin  Integrity Issues: Skin Integrity Issues:: Other (Comment) Other: WOC-Right BKA posterior wound is circular, yellow, black and pink with scant purulent drainage, measuring 3.5 x 2; Sacral wound measures 12.5 x 11 x 3.5 with rolled edges and boggy pink protruding tissue Unstageable: L buttocks  Last BM:  8/4  Height:   Ht Readings from Last 1 Encounters:  03/29/22 5\' 5"  (1.651 m)    Weight:   Wt Readings from Last 1 Encounters:  03/29/22 98 kg    BMI:  Body mass index is 35.95 kg/m.  Estimated Nutritional Needs:   Kcal:  1800-2000  Protein:  90-105 grams  Fluid:  >/= 1.8 L    05/29/22 RD, LDN Clinical Dietitian See [facility] for contact information.

## 2022-03-29 NOTE — Anesthesia Procedure Notes (Signed)
Procedure Name: Intubation Date/Time: 03/29/2022 1:44 PM  Performed by: Marny Lowenstein, CRNAPre-anesthesia Checklist: Patient identified, Emergency Drugs available, Suction available and Patient being monitored Patient Re-evaluated:Patient Re-evaluated prior to induction Oxygen Delivery Method: Circle system utilized Preoxygenation: Pre-oxygenation with 100% oxygen Induction Type: IV induction, Rapid sequence and Cricoid Pressure applied Laryngoscope Size: Miller and 2 Grade View: Grade I Tube type: Oral Tube size: 7.0 mm Number of attempts: 1 Airway Equipment and Method: Patient positioned with wedge pillow and Stylet Placement Confirmation: ETT inserted through vocal cords under direct vision, positive ETCO2 and breath sounds checked- equal and bilateral Secured at: 20 cm Tube secured with: Tape Dental Injury: Teeth and Oropharynx as per pre-operative assessment

## 2022-03-29 NOTE — Progress Notes (Addendum)
HD#7 Subjective:   Summary: [name] is a 61 y.o. with a pertinent PMH of HFpEF, COPD, GERD, HLD, HTN, hypothyroidism, OSA on CPAP, PVD, sinus bradycardia s/p pacemaker, and insulin-dependent type 2 diabetes who presented with hypotension and admitted for sepsis and malnutrition.   Overnight Events: none  She was awake and was having throat pain possibly from the feeding tube. Did not answer other questions.    Objective:  Vital signs in last 24 hours: Vitals:   03/29/22 1256 03/29/22 1420 03/29/22 1430 03/29/22 1500  BP: 106/72 102/65 131/68 (!) 145/66  Pulse: 64 64 60 62  Resp: 17 15 17 16   Temp: 98.5 F (36.9 C) 97.6 F (36.4 C)  97.8 F (36.6 C)  TempSrc: Oral     SpO2: 95% 99% 99% 97%  Weight: 98 kg     Height: 5\' 5"  (1.651 m)        Physical Exam:  Constitutional: awake, in no acute distress HENT: normocephalic atraumatic, mucous membrane dry Neck: supple Cardiovascular: regular rate and rhythm Extremities: Bilateral BKA, right BKA residual limb has ulcerated wound with clean bandage Neurological: awake but confused Skin: warm and dry Psych: normal mood  Filed Weights   03/26/22 0500 03/29/22 0443 03/29/22 1256  Weight: 70 kg 98 kg 98 kg     Intake/Output Summary (Last 24 hours) at 03/29/2022 1510 Last data filed at 03/29/2022 1420 Gross per 24 hour  Intake 600 ml  Output 2275 ml  Net -1675 ml    Net IO Since Admission: -8,262.54 mL [03/29/22 1510]  Pertinent Labs:    Latest Ref Rng & Units 03/29/2022    5:03 AM 03/28/2022    5:21 AM 03/27/2022   11:01 PM  CBC  WBC 4.0 - 10.5 K/uL 7.2  7.3    Hemoglobin 12.0 - 15.0 g/dL 05/28/2022  05/27/2022  65.7   Hematocrit 36.0 - 46.0 % 36.0  34.5  35.1   Platelets 150 - 400 K/uL 207  236         Latest Ref Rng & Units 03/29/2022    5:03 AM 03/28/2022    5:21 AM 03/27/2022    8:47 AM  CMP  Glucose 70 - 99 mg/dL 05/28/2022  05/27/2022  85   BUN 6 - 20 mg/dL 27  22  18    Creatinine 0.44 - 1.00 mg/dL 952  841    Sodium 135 -  145 mmol/L 136  138  141   Potassium 3.5 - 5.1 mmol/L 4.2  4.0  4.0   Chloride 98 - 111 mmol/L 107  112  112   CO2 22 - 32 mmol/L 19  19  22    Calcium 8.9 - 10.3 mg/dL 9.4  9.5  9.9     Imaging: No results found.  Assessment/Plan:   Principal Problem:   AKI (acute kidney injury) (HCC) Active Problems:   Uncontrolled type 2 diabetes mellitus with hyperglycemia (HCC)   Dehiscence of amputation stump of right lower extremity (HCC)   Sacral wound   Severe protein-calorie malnutrition (HCC)   Patient Summary: [name] is a 61 y.o. with a pertinent PMH of HFpEF, COPD, GERD, HLD, HTN, hypothyroidism, OSA on CPAP, PVD, sinus bradycardia s/p pacemaker, and insulin-dependent type 2 diabetes who presented with hypotension and admitted for sepsis and malnutrition.   Hypotension Sepsis vs malnutrition. MAPs above 70. Feeding tube placed today. BC showed no growth. On IV antibiotics.  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 Right LE ulceration with bony exposure. Palliative care talked with family and they want full scope of care including surgery for stump revision. Ortho plan for right AKA this afternoon, received 2 units PRBC.  -continue antibiotics per above -continue wound care -surgery today  AKI on CKD 3B Chronic Foley catheter Baseline creatinine of 1.5 from prior hospitalization. AKI improving since admission. Likely consider prerenal etiology from hypotension and poor p.o. intake.  -BMP -strict I/O -Continue foley care -if worsening renal function, consider nephrology referral  Chronic sacral wound She reports chronic pain from the sacral wound. Wound care is following. Concern for poor wound healing given malnutrition and poor glycemic control.  Appreciate wound care recs.  -Continue wound  care -Continue home Tylenol and oxycodone pain regimen  Malnutrition Feeding tube in place. 1000 ml at 40 ml/hr continuous today. Goal is 50 ml/hr. She has not had recent bowel movement, will start bowel regimen. She is having sore throat possibly from the cortrak.  -SSI -chloraseptic spray for sore throat -start miralax and senna via feeding tube  Hypokalemia Potassium steady ~4.  Magnesium was 1.9. Continue to monitor. -BMP  Episodic Hypoglycemia Insulin-dependent type 2 diabetes mellitus No recent overnight hypoglycemia. C-peptide was 14.4, insulin 34.2, which is endogenous response after dextrose administration. Now that she has feeding tube, no overnight hypoglycemia noted. Episodic hypoglycemia could be from poor po intake. She is on SSI now that she has a cortrak.  -CBG every 2 hours  HFpEF 2017 Echo showed EF of 65-70%.    Normocytic anemia  Anemia of chronic disease in setting of multiple chronic wounds. Surgery planned for Wednesday so transfused 2 units PRBC. -CBC  Hx of hypertension Hold lisinopril and carvedilol in setting of hypotension and AKI  Hypothyroidism TSH was 0.920 today. Will continue levothyroxine.  HLD Continue home rosuvastatin 40mg .  OSA CPAP qhs  GERD Continue Protonix 40 mg daily.  Diet: Feeding Tube (50 ml/hr continuous) IVF: None,None VTE: Enoxaparin (hold for surgery) Code: Full   Dispo: Anticipated discharge to nursing facility pending medically stable.   , DO Internal Medicine Resident PGY-1 Please contact the on call pager after 5 pm and on weekends at [phone].

## 2022-03-30 ENCOUNTER — Encounter (HOSPITAL_COMMUNITY): Payer: Self-pay | Admitting: Orthopedic Surgery

## 2022-03-30 DIAGNOSIS — L89159 Pressure ulcer of sacral region, unspecified stage: Secondary | ICD-10-CM | POA: Diagnosis not present

## 2022-03-30 DIAGNOSIS — M868X6 Other osteomyelitis, lower leg: Secondary | ICD-10-CM

## 2022-03-30 DIAGNOSIS — T8781 Dehiscence of amputation stump: Secondary | ICD-10-CM | POA: Diagnosis not present

## 2022-03-30 DIAGNOSIS — N1832 Chronic kidney disease, stage 3b: Secondary | ICD-10-CM | POA: Diagnosis not present

## 2022-03-30 DIAGNOSIS — S31000D Unspecified open wound of lower back and pelvis without penetration into retroperitoneum, subsequent encounter: Secondary | ICD-10-CM | POA: Diagnosis not present

## 2022-03-30 DIAGNOSIS — E43 Unspecified severe protein-calorie malnutrition: Secondary | ICD-10-CM | POA: Diagnosis not present

## 2022-03-30 DIAGNOSIS — I959 Hypotension, unspecified: Secondary | ICD-10-CM | POA: Diagnosis not present

## 2022-03-30 DIAGNOSIS — N179 Acute kidney failure, unspecified: Secondary | ICD-10-CM | POA: Diagnosis not present

## 2022-03-30 DIAGNOSIS — Z7189 Other specified counseling: Secondary | ICD-10-CM | POA: Diagnosis not present

## 2022-03-30 LAB — VITAMIN A: Vitamin A (Retinoic Acid): 24.3 ug/dL (ref 22.0–69.5)

## 2022-03-30 LAB — BASIC METABOLIC PANEL
Anion gap: 8 (ref 5–15)
BUN: 34 mg/dL — ABNORMAL HIGH (ref 6–20)
CO2: 20 mmol/L — ABNORMAL LOW (ref 22–32)
Calcium: 9.4 mg/dL (ref 8.9–10.3)
Chloride: 107 mmol/L (ref 98–111)
Creatinine, Ser: 1.93 mg/dL — ABNORMAL HIGH (ref 0.44–1.00)
GFR, Estimated: 29 mL/min — ABNORMAL LOW (ref >60)
Glucose, Bld: 345 mg/dL — ABNORMAL HIGH (ref 70–99)
Potassium: 4.6 mmol/L (ref 3.5–5.1)
Sodium: 135 mmol/L (ref 135–145)

## 2022-03-30 LAB — MAGNESIUM: Magnesium: 2.2 mg/dL (ref 1.7–2.4)

## 2022-03-30 LAB — CBC
HCT: 36.1 % (ref 36.0–46.0)
Hemoglobin: 11.8 g/dL — ABNORMAL LOW (ref 12.0–15.0)
MCH: 28.6 pg (ref 26.0–34.0)
MCHC: 32.7 g/dL (ref 30.0–36.0)
MCV: 87.4 fL (ref 80.0–100.0)
Platelets: 207 10*3/uL (ref 150–400)
RBC: 4.13 MIL/uL (ref 3.87–5.11)
RDW: 16.3 % — ABNORMAL HIGH (ref 11.5–15.5)
WBC: 9.6 10*3/uL (ref 4.0–10.5)
nRBC: 0 % (ref 0.0–0.2)

## 2022-03-30 LAB — GLUCOSE, CAPILLARY
Glucose-Capillary: 257 mg/dL — ABNORMAL HIGH (ref 70–99)
Glucose-Capillary: 324 mg/dL — ABNORMAL HIGH (ref 70–99)
Glucose-Capillary: 334 mg/dL — ABNORMAL HIGH (ref 70–99)
Glucose-Capillary: 345 mg/dL — ABNORMAL HIGH (ref 70–99)
Glucose-Capillary: 346 mg/dL — ABNORMAL HIGH (ref 70–99)
Glucose-Capillary: 348 mg/dL — ABNORMAL HIGH (ref 70–99)

## 2022-03-30 LAB — PHOSPHORUS: Phosphorus: 2.8 mg/dL (ref 2.5–4.6)

## 2022-03-30 MED ORDER — ENOXAPARIN SODIUM 40 MG/0.4ML IJ SOSY
40.0000 mg | PREFILLED_SYRINGE | INTRAMUSCULAR | Status: DC
Start: 2022-03-30 — End: 2022-04-04
  Administered 2022-03-30 – 2022-04-03 (×5): 40 mg via SUBCUTANEOUS
  Filled 2022-03-30 (×5): qty 0.4

## 2022-03-30 MED ORDER — INSULIN GLARGINE-YFGN 100 UNIT/ML ~~LOC~~ SOLN
10.0000 [IU] | Freq: Every day | SUBCUTANEOUS | Status: DC
  Administered 2022-03-30 – 2022-03-31 (×2): 10 [IU] via SUBCUTANEOUS
  Filled 2022-03-30 (×3): qty 0.1

## 2022-03-30 NOTE — Progress Notes (Signed)
This chaplain attempted spiritual care visit for creating/updating the Pt. Advance Directive.   This chaplain was updated by OT-Julie outside the Pt. room. The chaplain understands OT is trying to determine the Pt. baseline from [facility]. The chaplain will attempt a re-visit later in the day.  Chaplain Stephanie Acre [phone]

## 2022-03-30 NOTE — Inpatient Diabetes Management (Signed)
Inpatient Diabetes Program Recommendations  AACE/ADA: New Consensus Statement on Inpatient Glycemic Control (2015)  Target Ranges:  Prepandial:   less than 140 mg/dL      Peak postprandial:   less than 180 mg/dL (1-2 hours)      Critically ill patients:  140 - 180 mg/dL   Lab Results  Component Value Date   GLUCAP 334 (H) 03/30/2022   HGBA1C 12.6 (H) 02/06/2022    Latest Reference Range & Units 03/29/22 16:24 03/29/22 20:03 03/29/22 23:35 03/30/22 04:02 03/30/22 08:10  Glucose-Capillary 70 - 99 mg/dL 951 (H) 884 (H) 166 (H) 257 (H) 334 (H)  (H): Data is abnormally high Review of Glycemic Control  Diabetes history: type 2 Outpatient Diabetes medications: Basaglar 20 units daily, Amaryl 4 mg BID, Farxiga 10 mg daily Current orders for Inpatient glycemic control: Novolog 0-9 units every 4 hours  Inpatient Diabetes Program Recommendations:   Noted that blood sugars have been greater than 180 mg/dl today.  Recommend adding Novolog 5 units every 4 hours with continuous tube feedings and if blood sugars continue to be elevated.  Smith Mince RN BSN CDE Diabetes Coordinator Pager: [phone]  8am-5pm

## 2022-03-30 NOTE — Progress Notes (Signed)
Daily Progress Note   Patient Name: [name]       Date: 03/30/2022 DOB: 1960-11-11  Age: 61 y.o. MRN#: [id number] Attending Physician: Inez Catalina, MD Primary Care Physician: Loyal Jacobson, MD Admit Date: 03/22/2022  Reason for Consultation/Follow-up: Establishing goals of care  Patient Profile/HPI: 60 year old with PMH history of HFpEF, COPD, GERD, HLD, HTN, hypothyroidism , OSA on CPAP, PVD, sinus tachycardia s/p pacemaker, and IDDM who has a chronic sacral wound that was I &D with hospitalization 6/18-2/27, right BKA with exposed bone and poor wound healing who presented with hypotension and admitted for sepsis and malnutrition. [name] has been ordered. Ortho has consulted for right BKA repair. Per note patient has shared she did want to have additional surgery to stump.   Subjective: Received message that patient's brother was at bedside and asking to speak with Palliative provider. Chart reviewed.  She had R above the knee amputation yesterday 8/9. Today she has some increasing confusion.  Brother, [name] is at bedside.  He had questions regarding advanced directives/HCPOA documentation.  On my exam patient's mental status does not show her having capacity at this moment to complete HCPOA document. She is only able to tell me her name. She does not answer any other questions.  [name] also shared his concerns regarding her confusion and her prognosis.  He worries about her ability to return to her home, and the fact that she would not want to live in a nursing facility. He shares his journey when he had to make end of life decisions for his mother and he sees [name] facing the same path.  We discussed the differences in reversing dying and restoring life. [name] is considering  whether [name] would want her dying reversed in her current state.  He would like to discuss more goals of care once his sister, [name], arrives from out of town this weekend.   Review of Systems  Unable to perform ROS: Mental status change     Physical Exam Vitals and nursing note reviewed.  Constitutional:      Appearance: She is ill-appearing.  Cardiovascular:     Rate and Rhythm: Normal rate.  Pulmonary:     Effort: Pulmonary effort is normal.  Musculoskeletal:     Comments: S/p recent R AKA and remote healed L  aka  Skin:    General: Skin is warm and dry.     Coloration: Skin is pale.  Neurological:     Mental Status: She is disoriented.             Vital Signs: BP (!) 121/47   Pulse 65   Temp 97.9 F (36.6 C) (Axillary)   Resp 20   Ht 5\' 5"  (1.651 m)   Wt 98.6 kg   SpO2 97%   BMI 36.17 kg/m  SpO2: SpO2: 97 % O2 Device: O2 Device: Room Air O2 Flow Rate:    Intake/output summary:  Intake/Output Summary (Last 24 hours) at 03/30/2022 1731 Last data filed at 03/30/2022 0600 Gross per 24 hour  Intake 790 ml  Output 700 ml  Net 90 ml   LBM: Last BM Date :  (pta) Baseline Weight: Weight: 85.5 kg Most recent weight: Weight: 98.6 kg       Palliative Assessment/Data: PPS: 10%      Patient Active Problem List   Diagnosis Date Noted   Dehiscence of amputation stump of right lower extremity (HCC)    Sacral wound    Severe protein-calorie malnutrition (HCC)    Cellulitis of sacral region 02/05/2022   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   Cystitis 10/16/2018   History of left below knee amputation ([place]) 07/16/2017   Abnormal urinalysis 06/18/2017   Diabetic gastroparesis ([place]) 06/18/2017   History of TIA (transient ischemic attack) 06/18/2017   Hyperlipidemia 04/10/2017   Acquired absence of right leg below knee ([place]) 11/29/2016   Left facial numbness    Cerebrovascular disease    Diabetic polyneuropathy associated with type 2 diabetes  mellitus (HCC)    Chronic nonintractable headache    GERD (gastroesophageal reflux disease) 07/21/2016   TIA (transient ischemic attack) 07/04/2016   Hyperkalemia 12/07/2015   Cellulitis in diabetic foot ([place]) 12/05/2015   Herpes simplex labialis    Altered mental status    Thrush 10/20/2015   HSV-1 (herpes simplex virus 1) infection    Hallucinations    Acute respiratory failure with hypoxia (HCC) 10/14/2015   Hyperglycemia 10/13/2015   Diabetes mellitus due to underlying condition with hyperosmolarity without nonketotic hyperglycemic-hyperosmolar coma [name]) ([name]) 10/13/2015   Stage 3b chronic kidney disease (CKD) ([name]) 10/13/2015   AKI (acute kidney injury) ([name]) [id number]   Diastolic CHF ([name]) [id number]   Acute encephalopathy 10/13/2015   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   Lactic acid acidosis    Diabetic foot ulcer (HCC) 12/08/2014   Symptomatic bradycardia 03/04/2014   OSA on CPAP with oxygen 03/04/2014   COPD (chronic obstructive pulmonary disease), on home O2 03/04/2014   Bradycardia 03/04/2014   Cellulitis 08/16/2013   Chronic diastolic heart failure (HCC) 08/16/2013   Sepsis ([name]) 08/16/2013   SOB (shortness of breath) 04/20/2013   Proteinuria 01/26/2013   Acute on chronic kidney failure ([name]) 01/24/2013   Abscess of groin, right 01/23/2013   HTN (hypertension) 01/23/2013   Leukocytosis 01/23/2013   Anemia of chronic disease 01/23/2013   Hypothyroidism 01/23/2013   Hyponatremia 10/06/2011   HYPERCHOLESTEROLEMIA 08/17/2010   Depression 02/01/2010   TOBACCO ABUSE 06/04/2009   Uncontrolled type 2 diabetes mellitus with hyperglycemia ([name]) 08/06/2007    Palliative Care Assessment & Plan    Assessment/Recommendations/Plan  Continue current plan of care Brother would like to meet on Saturday with his sister and PMT for further discussion regarding GOC    Code Status: Full code  Prognosis:  Unable to determine  Discharge Planning: To Be  Determined  Care plan was discussed with patient and care team.   Thank you for allowing the Palliative Medicine Team to assist in the care of this patient.   Greater than 50%  of this time was spent counseling and coordinating care related to the above assessment and plan.  Ocie Bob, AGNP-C Palliative Medicine   Please contact Palliative Medicine Team phone at [phone] for questions and concerns.

## 2022-03-30 NOTE — Progress Notes (Signed)
Patient ID: [name], female   DOB: 10-19-1960, 61 y.o.   MRN: [id number] Patient is postoperative day 1 right above-the-knee amputation.  There is no drainage in the wound VAC canister.  Anticipate discharge to skilled nursing.  Discontinue the wound VAC at time of discharge and apply dry dressing.  I will follow-up in the office in 1 week.

## 2022-03-30 NOTE — Evaluation (Signed)
Occupational Therapy Evaluation Patient Details Name: [name] MRN: [id number] DOB: [birth year] Today's Date: 03/30/2022   History of Present Illness 61 yo female presents to [facility] on 8[facility] for RLE wound infection, sacral wound, AKI. s/p cortrak 8/7, s/p R AKA 8/9. PMH includes bilat BKA, COPD, OSA, T2DM, depression, HLD, HTN, anemia, CKD, CHF, TIA, GERD, gastroparesis, bradycardia s/p pacemaker, and anxiety.   Clinical Impression   Pt has been at [facility] since June 2023. Call to facility to learn pt's PLOF went unanswered. Pt presents with confusion and generalized weakness with dependence in all ADLs. She currently requires +2 assistance for bed level mobility and is dependent on B UE support on bed rails for sitting balance. Recommending return to SNF for ongoing rehab.      Recommendations for follow up therapy are one component of a multi-disciplinary discharge planning process, led by the attending physician.  Recommendations may be updated based on patient status, additional functional criteria and insurance authorization.   Follow Up Recommendations  Skilled nursing-short term rehab (<3 hours/day)    Assistance Recommended at Discharge Frequent or constant Supervision/Assistance  Patient can return home with the following Two people to help with walking and/or transfers;A lot of help with bathing/dressing/bathroom;Assistance with feeding;Assistance with cooking/housework;Direct supervision/assist for medications management;Direct supervision/assist for financial management;Assist for transportation;Help with stairs or ramp for entrance    Functional Status Assessment  Patient has had a recent decline in their functional status and/or demonstrates limited ability to make significant improvements in function in a reasonable and predictable amount of time  Equipment Recommendations  Other (comment) (TBD)    Recommendations for Other Services        Precautions / Restrictions Precautions Precautions: Fall Precaution Comments: R AKA, L BKA, sacral wound and on airbed      Mobility Bed Mobility Overal bed mobility: Needs Assistance Bed Mobility: Rolling, Supine to Sit, Sit to Supine Rolling: +2 for physical assistance, Mod assist, Min assist   Supine to sit: Mod assist, +2 for safety/equipment Sit to supine: Mod assist, +2 for safety/equipment   General bed mobility comments: mod assist for pull to/from sit with use of bed rails for trunk elevation/lowering. Mod +2 for rolling bilat for truncal and LE translation when requested, but min assist when pt choosing to roll    Transfers                   General transfer comment: pt declines      Balance Overall balance assessment: Needs assistance Sitting-balance support: Bilateral upper extremity supported Sitting balance-[name]: Poor Sitting balance - Comments: reliant on UEs to balance, able to release rail with one hand briefly                                   ADL either performed or assessed with clinical judgement   ADL                                         General ADL Comments: pt currently dependent with confusion     Vision Patient Visual Report: No change from baseline       Perception     Praxis      Pertinent Vitals/Pain Pain Assessment Pain Assessment: Faces Faces Pain Scale: Hurts little more Pain  Location: R residual limb, buttocks Pain Descriptors / Indicators: Discomfort, Guarding, Grimacing Pain Intervention(s): Monitored during session, Repositioned     Hand Dominance Left   Extremity/Trunk Assessment Upper Extremity Assessment Upper Extremity Assessment: Generalized weakness   Lower Extremity Assessment Lower Extremity Assessment: Defer to PT evaluation RLE Deficits / Details: limited secondary to pain, wound vac donned LLE Deficits / Details: hx bka, lacking at least 15 degrees terminal  knee extension   Cervical / Trunk Assessment Cervical / Trunk Assessment: Other exceptions (weakness)   Communication Communication Communication: No difficulties   Cognition Arousal/Alertness: Awake/alert Behavior During Therapy: Flat affect Overall Cognitive Status: Impaired/Different from baseline Area of Impairment: Orientation, Attention, Memory, Following commands, Awareness, Problem solving, Safety/judgement                 Orientation Level: Disoriented to, Place, Time, Situation Current Attention Level: Focused   Following Commands: Follows one step commands inconsistently Safety/Judgement: Decreased awareness of safety, Decreased awareness of deficits (pulling at lines) Awareness: Intellectual Problem Solving: Slow processing, Decreased initiation, Difficulty sequencing, Requires verbal cues, Requires tactile cues General Comments: pt answers most questions monosyllabically, uses yes/no unreliably at times. Pt with periods of irritability during mobility, calms when she feels in control of session     General Comments  sacral wound covered with mepilex    Exercises     Shoulder Instructions      Home Living Family/patient expects to be discharged to:: Skilled nursing facility                                 Additional Comments: pt has been at [facility] since June 2023      Prior Functioning/Environment Prior Level of Function : Needs assist             Mobility Comments: per OT report x1 month ago, "pt reports she was transferring to [facility] daily, unclear if she was able to transfer independently as her responses to this question varied" ADLs Comments: prior to SNF, pt had a caregiver per chart review. Unsure of level of assist needed at SNF, assuming max-total        OT Problem List: Decreased strength;Impaired balance (sitting and/or standing);Decreased activity tolerance;Decreased cognition;Decreased safety awareness;Obesity;Pain       OT Treatment/Interventions: Self-care/ADL training;Therapeutic activities;Patient/family education;Balance training;DME and/or AE instruction;Cognitive remediation/compensation    OT Goals(Current goals can be found in the care plan section) Acute Rehab OT Goals OT Goal Formulation: Patient unable to participate in goal setting Time For Goal Achievement: 04/13/22 Potential to Achieve Goals: Fair ADL Goals Pt Will Perform Eating: with set-up;sitting;bed level Pt Will Perform Grooming: with min assist;sitting;bed level Pt Will Perform Upper Body Dressing: with min assist;sitting Pt Will Transfer to Toilet: with mod assist;with transfer board;anterior/posterior transfer;bedside commode Additional ADL Goal #1: Pt will perform bed mobility with min assist using bed rails in preparation for ADLs and for pressure relief. Additional ADL Goal #2: Pt will demonstrate sustained attention as a precursor to ADLs.  OT Frequency: Min 2X/week    Co-evaluation PT/OT/SLP Co-Evaluation/Treatment: Yes Reason for Co-Treatment: Necessary to address cognition/behavior during functional activity;For patient/therapist safety PT goals addressed during session: Mobility/safety with mobility;Balance OT goals addressed during session: ADL's and self-care;Strengthening/ROM      AM-PAC OT "6 Clicks" Daily Activity     Outcome Measure Help from another person eating meals?: Total Help from another person taking care of personal grooming?: Total Help  from another person toileting, which includes using toliet, bedpan, or urinal?: Total Help from another person bathing (including washing, rinsing, drying)?: Total Help from another person to put on and taking off regular upper body clothing?: Total Help from another person to put on and taking off regular lower body clothing?: Total 6 Click Score: 6   End of Session Nurse Communication: Mobility status  Activity Tolerance: Patient tolerated treatment  well Patient left: in bed;with call bell/phone within reach;with bed alarm set  OT Visit Diagnosis: Pain;Muscle weakness (generalized) (M62.81);Other symptoms and signs involving cognitive function                Time: 4917-9150 OT Time Calculation (min): 18 min Charges:  OT General Charges $OT Visit: 1 Visit OT Evaluation $OT Eval Moderate Complexity: 1 Mod  Berna Spare, OTR/L Acute Rehabilitation Services Office: [phone]   Evern Bio 03/30/2022, 12:49 PM

## 2022-03-30 NOTE — Progress Notes (Addendum)
HD#8 Subjective:   Summary: [name] is a 61 y.o. with a pertinent PMH of HFpEF, COPD, GERD, HLD, HTN, hypothyroidism, OSA on CPAP, PVD, sinus bradycardia s/p pacemaker, and insulin-dependent type 2 diabetes who presented with hypotension and admitted for sepsis and malnutrition.   Overnight Events: none  [name] is still confused this morning. Oriented to person but not time or place. She was having throat pain yesterday which she denies today.    Objective:  Vital signs in last 24 hours: Vitals:   03/29/22 2300 03/30/22 0000 03/30/22 0400 03/30/22 0500  BP:  (!) 164/57    Pulse: 69 70 68   Resp: 11 14 16    Temp:  97.6 F (36.4 C) 97.9 F (36.6 C)   TempSrc:  Axillary Axillary   SpO2: 98% 98% 97%   Weight:    98.6 kg  Height:         Physical Exam:  Constitutional: awake, in no acute distress HENT: normocephalic atraumatic, mucous membrane dry Neck: supple Cardiovascular: regular rate and rhythm Pulmonary: normal work of breathing Extremities: Left BKA, Right AKA on 8/9 Neurological: awake but confused Skin: warm and dry Psych: normal mood  Filed Weights   03/29/22 0443 03/29/22 1256 03/30/22 0500  Weight: 98 kg 98 kg 98.6 kg     Intake/Output Summary (Last 24 hours) at 03/30/2022 1326 Last data filed at 03/30/2022 0600 Gross per 24 hour  Intake 1090 ml  Output 1850 ml  Net -760 ml    Net IO Since Admission: -8,172.54 mL [03/30/22 1326]  Pertinent Labs:    Latest Ref Rng & Units 03/30/2022    7:50 AM 03/29/2022    5:03 AM 03/28/2022    5:21 AM  CBC  WBC 4.0 - 10.5 K/uL 9.6  7.2  7.3   Hemoglobin 12.0 - 15.0 g/dL 05/28/2022  16.1  09.6   Hematocrit 36.0 - 46.0 % 36.1  36.0  34.5   Platelets 150 - 400 K/uL 207  207  236        Latest Ref Rng & Units 03/30/2022    7:50 AM 03/29/2022    5:03 AM 03/28/2022    5:21 AM  CMP  Glucose 70 - 99 mg/dL 05/28/2022  409  811   BUN 6 - 20 mg/dL 34  27  22   Creatinine 0.44 - 1.00 mg/dL 914  7.82  9.56   Sodium 135 -  145 mmol/L 135  136  138   Potassium 3.5 - 5.1 mmol/L 4.6  4.2  4.0   Chloride 98 - 111 mmol/L 107  107  112   CO2 22 - 32 mmol/L 20  19  19    Calcium 8.9 - 10.3 mg/dL 9.4  9.4  9.5     Imaging: No results found.  Assessment/Plan:   Principal Problem:   AKI (acute kidney injury) (HCC) Active Problems:   Uncontrolled type 2 diabetes mellitus with hyperglycemia (HCC)   Dehiscence of amputation stump of right lower extremity (HCC)   Sacral wound   Severe protein-calorie malnutrition (HCC)   Patient Summary: [name] is a 61 y.o. with a pertinent PMH of HFpEF, COPD, GERD, HLD, HTN, hypothyroidism, OSA on CPAP, PVD, sinus bradycardia s/p pacemaker, and insulin-dependent type 2 diabetes who presented with hypotension and admitted for sepsis and malnutrition.    Right LE Osteomyelitis s/p Right AKA P/O day 1. Patient presented with hypotension, leukocytosis and AKI. Noted to have right LE ulceration with  bony exposure. Ortho performed right AKA on 8/9. Vancomycin and cefepime were stopped, total 7 day course. BC showed no growth. Appreciate ortho assistance.  -continue wound care  Hypotension Sepsis vs malnutrition. MAPs continue to be >70. Feeding tube in place. Continue to monitor. -MAP goal above 65, start fluids if needed  AKI on CKD 3B Chronic Foley catheter Baseline creatinine of 1.5 from prior hospitalization. AKI improving since admission. Likely consider prerenal etiology from hypotension and poor p.o. intake.  -BMP -strict I/O -Continue foley care -if worsening renal function, consider nephrology referral  Chronic sacral wound She reports chronic pain from the sacral wound. Wound care is following. Concern for poor wound healing given malnutrition and poor glycemic control.  Appreciate wound care recs.  -Continue wound care -Continue home Tylenol and oxycodone pain regimen  Malnutrition Feeding tube in place. 1000 ml at 50 ml/hr continuous today. She has not  had recent bowel movement, will start bowel regimen. Plan to increase free water with her feedings.  -increase free water from 30cc to 45cc every 4 hours -continue SSI, will start semglee 10 units daily -chloraseptic spray for sore throat -miralax and senna via feeding tube  Hypokalemia Potassium steady ~4.  Magnesium was 1.9. Continue to monitor.  Insulin-dependent type 2 diabetes mellitus Episodic Hypoglycemia secondary to malnutrition Episodic hypoglycemia could be from poor po intake. She has cortrak in place and no new hypoglycemia. On SSI. CBGs have been 200-300s.  -start semglee 10 units daily -continue SSI  HFpEF 2017 Echo showed EF of 65-70%.    Normocytic anemia  Anemia of chronic disease in setting of multiple chronic wounds. Right AKA done on 8/9, transfused 2 units PRBC prior to surgery. Hgb steady.   Hx of hypertension Hold lisinopril and carvedilol in setting of hypotension and AKI  Hypothyroidism TSH was 0.920 today. Will continue levothyroxine.  HLD Continue home rosuvastatin 40mg .  OSA CPAP qhs  GERD Continue Protonix 40 mg daily.  Diet: Feeding Tube (50 ml/hr continuous) IVF: None,None VTE: Enoxaparin  Code: Full   Dispo: Anticipated discharge to nursing facility pending medically stable.   , DO Internal Medicine Resident PGY-1 Please contact the on call pager after 5 pm and on weekends at [phone].

## 2022-03-30 NOTE — Progress Notes (Signed)
This chaplain is present at the bedside after OT-Julie sent a secure message sharing the Pt. brother-[name] and friend-Wendy's presence at the bedside. The chaplain's morning spiritual care attempt was unsuccessful for creating/updating the Pt. Advance Directive.  The chaplain introduced herself to [name] and [name] as a member of the Palliative Medicine Team. [name] is requesting a Palliative provider follow up, noting the Pt. post-op changes in behavior. The chaplain understands PMT NP-Kasie will F/U this afternoon. Both [name] and RN-Morgan were updated by the chaplain.  This chaplain is available for F/U spiritual care as needed.  Chaplain Stephanie Acre [phone]

## 2022-03-30 NOTE — Progress Notes (Signed)
Patient PP:[name] [name]      DOB: 04/13/1961      IZT:[id number]      Palliative Medicine Team    Subjective: Bedside symptom check completed. No family or visitors present at time of visit. RN bedside administering medications at time of visit.   Physical exam: Patient resting in bed with eyes closed at time of visit. Breathing even and non-labored, no excessive secretions noted. Patient without physical or non-verbal signs of pain or discomfort at this time. Patient easily awakens with verbal cue by bedside RN at this time. This RN introduced self and role on the team, patient pleasantly conversational, able to maintain eye contact throughout conversation.    Assessment and plan: This RN offered to reposition pillows, patient declined, stating she was comfortable as is. Patient denies pain, nausea, or discomfort with breathing at this time. Patient and bedside RN without further needs or concerns today. This RN shares that she will continue to check in throughout hospital stay and that if anything changes, nursing can get a hold of our team. Patient in agreement and goes back to resting. Will continue to follow for any changes or advances.    Thank you for allowing the Palliative Medicine Team to assist in the care of this patient.     Shelda Jakes, MSN, RN Palliative Medicine Team Team Phone: [phone]  This phone is monitored 7a-7p, please reach out to attending physician outside of these hours for urgent needs.

## 2022-03-30 NOTE — Evaluation (Signed)
Physical Therapy Evaluation Patient Details Name: [name] MRN: [id number] DOB: 1961/01/04 Today's Date: 03/30/2022  History of Present Illness  61 yo female presents to [facility] from [facility] on 8/2 for RLE wound infection, sacral wound, AKI. s/p cortrak 8/7, s/p R AKA 8/9. PMH includes bilat BKA, COPD, OSA, T2DM, depression, HLD, HTN, anemia, CKD, CHF, TIA, GERD, gastroparesis, bradycardia s/p pacemaker, and anxiety.  Clinical Impression   Pt presents with poor skin integrity, poor activity tolerance, impaired mobility s/p R AKA. Pt to benefit from acute PT to address deficits. Pt requiring significant physical assist for pull to/from sit, declines sitting EOB or attempting transfer at this time. Pt did not provide history of functional status since being at SNF, according to chart review x1 month ago pt able to transfer into and out of w/c. PT to progress mobility as tolerated, and will continue to follow acutely.           Recommendations for follow up therapy are one component of a multi-disciplinary discharge planning process, led by the attending physician.  Recommendations may be updated based on patient status, additional functional criteria and insurance authorization.  Follow Up Recommendations Skilled nursing-short term rehab (<3 hours/day) Can patient physically be transported by private vehicle: No    Assistance Recommended at Discharge Frequent or constant Supervision/Assistance  Patient can return home with the following  Two people to help with walking and/or transfers;Two people to help with bathing/dressing/bathroom    Equipment Recommendations None recommended by PT  Recommendations for Other Services       Functional Status Assessment Patient has had a recent decline in their functional status and demonstrates the ability to make significant improvements in function in a reasonable and predictable amount of time.     Precautions / Restrictions  Precautions Precautions: Fall Precaution Comments: R AKA, L BKA, sacral wound and on airbed      Mobility  Bed Mobility Overal bed mobility: Needs Assistance Bed Mobility: Supine to Sit, Sit to Supine, Rolling Rolling: Mod assist, +2 for physical assistance   Supine to sit: Mod assist, +2 for safety/equipment Sit to supine: Mod assist, +2 for safety/equipment   General bed mobility comments: mod assist for pull to/from sit with use of bed rails for trunk elevation/lowering. Mod +2 for rolling bilat for truncal and LE translation    Transfers                   General transfer comment: pt declines    Ambulation/Gait                  Stairs            Wheelchair Mobility    Modified Rankin (Stroke Patients Only)       Balance Overall balance assessment: Needs assistance Sitting-balance support: Bilateral upper extremity supported Sitting balance-[name]: Poor Sitting balance - Comments: reliant on UEs to balance                                     Pertinent Vitals/Pain Pain Assessment Pain Assessment: Faces Faces Pain Scale: Hurts little more Pain Location: R residual limb Pain Descriptors / Indicators: Discomfort, Guarding, Grimacing Pain Intervention(s): Limited activity within patient's tolerance, Monitored during session, Repositioned    Home Living Family/patient expects to be discharged to:: Skilled nursing facility  Prior Function Prior Level of Function : Needs assist             Mobility Comments: per OT report x1 month ago, "pt reports she was transferring to [name] daily, unclear if she was able to transfer independently as her responses to this question varied" ADLs Comments: prior to SNF, pt had a caregiver per chart review. Unsure of level of assist needed at SNF, assuming max-total     Hand Dominance   Dominant Hand: Left    Extremity/Trunk Assessment   Upper  Extremity Assessment Upper Extremity Assessment: Defer to OT evaluation    Lower Extremity Assessment Lower Extremity Assessment: Generalized weakness;RLE deficits/detail;LLE deficits/detail RLE Deficits / Details: limited secondary to pain, wound vac donned LLE Deficits / Details: hx bka, lacking at least 15 degrees terminal knee extension    Cervical / Trunk Assessment Cervical / Trunk Assessment: Normal  Communication   Communication: No difficulties  Cognition Arousal/Alertness: Awake/alert Behavior During Therapy: Flat affect Overall Cognitive Status: No family/caregiver present to determine baseline cognitive functioning                                 General Comments: pt answers most questions monosyllabically, uses yes/no unreliably at times. Pt with periods of irritability during mobility, calms when she feels in control of session        General Comments General comments (skin integrity, edema, etc.): sacral wound covered with mepilex    Exercises     Assessment/Plan    PT Assessment Patient needs continued PT services  PT Problem List Decreased strength;Decreased mobility;Decreased safety awareness;Decreased range of motion;Decreased activity tolerance;Decreased balance;Decreased knowledge of use of DME;Pain;Obesity;Decreased skin integrity       PT Treatment Interventions DME instruction;Therapeutic activities;Therapeutic exercise;Patient/family education;Balance training;Functional mobility training;Neuromuscular re-education;Wheelchair mobility training    PT Goals (Current goals can be found in the Care Plan section)  Acute Rehab PT Goals PT Goal Formulation: With patient Time For Goal Achievement: 04/13/22 Potential to Achieve Goals: Good    Frequency Min 3X/week     Co-evaluation PT/OT/SLP Co-Evaluation/Treatment: Yes Reason for Co-Treatment: Necessary to address cognition/behavior during functional activity;For patient/therapist  safety PT goals addressed during session: Mobility/safety with mobility;Balance         AM-PAC PT "6 Clicks" Mobility  Outcome Measure Help needed turning from your back to your side while in a flat bed without using bedrails?: A Lot Help needed moving from lying on your back to sitting on the side of a flat bed without using bedrails?: A Lot Help needed moving to and from a bed to a chair (including a wheelchair)?: Total Help needed standing up from a chair using your arms (e.g., wheelchair or bedside chair)?: Total Help needed to walk in hospital room?: Total Help needed climbing 3-5 steps with a railing? : Total 6 Click Score: 8    End of Session   Activity Tolerance: Patient limited by fatigue;Patient limited by pain Patient left: in bed;with call bell/phone within reach;with bed alarm set Nurse Communication: Mobility status PT Visit Diagnosis: Muscle weakness (generalized) (M62.81);Pain    Time: 4034-7425 PT Time Calculation (min) (ACUTE ONLY): 18 min   Charges:   PT Evaluation $PT Eval Low Complexity: 1 Low         Carlethia Mesquita S, PT DPT Acute Rehabilitation Services Pager [phone]  Office [phone]   Zeva Leber E Christain [place] 03/30/2022, 11:37 AM

## 2022-03-31 DIAGNOSIS — Z515 Encounter for palliative care: Secondary | ICD-10-CM | POA: Diagnosis not present

## 2022-03-31 DIAGNOSIS — I959 Hypotension, unspecified: Secondary | ICD-10-CM | POA: Diagnosis not present

## 2022-03-31 DIAGNOSIS — N179 Acute kidney failure, unspecified: Secondary | ICD-10-CM | POA: Diagnosis not present

## 2022-03-31 DIAGNOSIS — N1832 Chronic kidney disease, stage 3b: Secondary | ICD-10-CM | POA: Diagnosis not present

## 2022-03-31 DIAGNOSIS — Z7189 Other specified counseling: Secondary | ICD-10-CM | POA: Diagnosis not present

## 2022-03-31 DIAGNOSIS — L89159 Pressure ulcer of sacral region, unspecified stage: Secondary | ICD-10-CM | POA: Diagnosis not present

## 2022-03-31 LAB — BASIC METABOLIC PANEL
Anion gap: 7 (ref 5–15)
BUN: 42 mg/dL — ABNORMAL HIGH (ref 6–20)
CO2: 22 mmol/L (ref 22–32)
Calcium: 9 mg/dL (ref 8.9–10.3)
Chloride: 105 mmol/L (ref 98–111)
Creatinine, Ser: 1.94 mg/dL — ABNORMAL HIGH (ref 0.44–1.00)
GFR, Estimated: 29 mL/min — ABNORMAL LOW (ref >60)
Glucose, Bld: 298 mg/dL — ABNORMAL HIGH (ref 70–99)
Potassium: 5 mmol/L (ref 3.5–5.1)
Sodium: 134 mmol/L — ABNORMAL LOW (ref 135–145)

## 2022-03-31 LAB — GLUCOSE, CAPILLARY
Glucose-Capillary: 300 mg/dL — ABNORMAL HIGH (ref 70–99)
Glucose-Capillary: 379 mg/dL — ABNORMAL HIGH (ref 70–99)
Glucose-Capillary: 388 mg/dL — ABNORMAL HIGH (ref 70–99)
Glucose-Capillary: 366 mg/dL — ABNORMAL HIGH (ref 70–99)
Glucose-Capillary: 354 mg/dL — ABNORMAL HIGH (ref 70–99)
Glucose-Capillary: 310 mg/dL — ABNORMAL HIGH (ref 70–99)
Glucose-Capillary: 192 mg/dL — ABNORMAL HIGH (ref 70–99)

## 2022-03-31 LAB — CBC
HCT: 33.6 % — ABNORMAL LOW (ref 36.0–46.0)
Hemoglobin: 10.7 g/dL — ABNORMAL LOW (ref 12.0–15.0)
MCH: 28.4 pg (ref 26.0–34.0)
MCHC: 31.8 g/dL (ref 30.0–36.0)
MCV: 89.1 fL (ref 80.0–100.0)
Platelets: 195 10*3/uL (ref 150–400)
RBC: 3.77 MIL/uL — ABNORMAL LOW (ref 3.87–5.11)
RDW: 16.5 % — ABNORMAL HIGH (ref 11.5–15.5)
WBC: 10 10*3/uL (ref 4.0–10.5)
nRBC: 0 % (ref 0.0–0.2)

## 2022-03-31 LAB — CULTURE, BLOOD (ROUTINE X 2)
Culture: NO GROWTH
Culture: NO GROWTH
Special Requests: ADEQUATE
Special Requests: ADEQUATE

## 2022-03-31 LAB — SURGICAL PATHOLOGY

## 2022-03-31 MED ORDER — INSULIN ASPART 100 UNIT/ML IJ SOLN
0.0000 [IU] | INTRAMUSCULAR | Status: DC
  Administered 2022-03-31: 3 [IU] via SUBCUTANEOUS
  Administered 2022-03-31: 15 [IU] via SUBCUTANEOUS
  Administered 2022-03-31: 11 [IU] via SUBCUTANEOUS
  Administered 2022-04-01: 5 [IU] via SUBCUTANEOUS
  Administered 2022-04-01: 8 [IU] via SUBCUTANEOUS
  Administered 2022-04-01 (×2): 5 [IU] via SUBCUTANEOUS

## 2022-03-31 MED ORDER — CLOPIDOGREL BISULFATE 75 MG PO TABS
75.0000 mg | ORAL_TABLET | Freq: Every day | ORAL | Status: DC
  Administered 2022-03-31 – 2022-04-08 (×9): 75 mg via ORAL
  Filled 2022-03-31 (×9): qty 1

## 2022-03-31 MED ORDER — INSULIN GLARGINE-YFGN 100 UNIT/ML ~~LOC~~ SOLN
20.0000 [IU] | Freq: Every day | SUBCUTANEOUS | Status: DC
  Administered 2022-04-01 – 2022-04-06 (×5): 20 [IU] via SUBCUTANEOUS
  Filled 2022-03-31 (×6): qty 0.2

## 2022-03-31 MED ORDER — GLUCERNA 1.5 CAL PO LIQD
1000.0000 mL | ORAL | Status: DC
Start: 2022-03-31 — End: 2022-04-02
  Administered 2022-03-31 – 2022-04-01 (×2): 1000 mL
  Filled 2022-03-31 (×3): qty 1000

## 2022-03-31 NOTE — Progress Notes (Addendum)
Palliative Medicine Inpatient Follow Up Note  HPI: 61 year old with PMH history of HFpEF, COPD, GERD, HLD, HTN, hypothyroidism , OSA on CPAP, PVD, sinus tachycardia s/p pacemaker, and IDDM who has a chronic sacral wound that was I &D with hospitalization 6/18-2/27, right BKA with exposed bone and poor wound healing who presented with hypotension and admitted for sepsis and malnutrition. [name] has been ordered. Ortho has consulted for right BKA repair. Per note patient has shared she did want to have additional surgery to stump.   Today's Discussion 03/31/2022  *Please note that this is a verbal dictation therefore any spelling or grammatical errors are due to the "[employer]" system interpretation.  Chart reviewed inclusive of vital signs, progress notes, laboratory results, and diagnostic images.   I spoke to Burdette's RN, Lilia Pro reviewed Palliative concerns and patients overall poor condition.   I met with patient at bedside. She was noted to be lying flat. She could open her eyes and respond though was unable to have > 3 word responses.   I called patients brother Audelia Acton to set up a meeting time though reached VM. I spoke to patients sister, Arrie Aran who shares she plans to visit from [employer]. She is presently unable to commit to a time though she states that she will speak to her brother and solidify a meeting time this afternoon.   I was able to briefly speak to [facility] about patient poor condition and the goals of tomorrow meeting.   Questions and concerns addressed/Palliative Support Provided.  ________________________ AddendumDamaris Schooner again to patients sister, Dawn confirmed that she and her brother will be here tomorrow at [facility] for a Palliative meeting. We reviewed the focus of the meeting. Patient sister shares that it is hard as patient was prior able to speak for herself so many decisions she does not know what her sister would want though she is happy to help guide  the medical team. She also asks what the long term will look like from a living perspective. I shared that much of that may be unknown until she goes through rehabilitation and right now in the setting of her encephalopathy she is quite limited. Reviewed that further conversations will be held tomorrow regarding this.  Objective Assessment: Vital Signs Vitals:   03/31/22 0338 03/31/22 0832  BP: (!) 142/60 135/62  Pulse: 76 68  Resp: 17 17  Temp: 97.8 F (36.6 C) 99.2 F (37.3 C)  SpO2: 98% 96%    Intake/Output Summary (Last 24 hours) at 03/31/2022 1156 Last data filed at 03/31/2022 0548 Gross per 24 hour  Intake 975 ml  Output 575 ml  Net 400 ml   Last Weight  Most recent update: 03/31/2022  4:33 AM    Weight  100 kg (220 lb 7.4 oz)            Gen:  Middle aged caucasian F, chronically ill appearing HEENT: Coretrack, dry mucous membranes CV: Regular rate and rhythm  PULM:  On RA, breathing is even and nonlabored ABD: soft/nontender/  EXT: BLE amputation wound vac on RLE Neuro: Alert and oriented to self  SUMMARY OF RECOMMENDATIONS   Full Code/Full scope of care  Plan for a family meeting tomorrow at 2pm- awaiting a time to be identified by family though goals of meeting are to discuss code status and patients overall healthcare trajectory  Ongoing support  Billing based on MDM: High ______________________________________________________________________________________ [place] Team Team Cell  Phone: [phone] Please utilize secure chat with additional questions, if there is no response within 30 minutes please call the above phone number  Palliative Medicine Team providers are available by phone from 7am to 7pm daily and can be reached through the team cell phone.  Should this patient require assistance outside of these hours, please call the patient's attending physician.

## 2022-03-31 NOTE — TOC Progression Note (Signed)
Transition of Care [facility]) - Progression Note    Patient Details  Name: [name] MRN: [id number] Date of Birth: 1961/08/17  Transition of [facility] CM/SW Contact  Mearl Latin, LCSW Phone Number: 03/31/2022, 10:59 AM  Clinical Narrative:    CSW continuing to follow.    Expected Discharge Plan: Skilled Nursing Facility Barriers to Discharge: Continued Medical Work up  Expected Discharge Plan and Services Expected Discharge Plan: Skilled Nursing Facility In-house Referral: Clinical Social Work   Post Acute Care Choice: Skilled Nursing Facility Living arrangements for the past 2 months: Skilled Nursing Facility                                       Social Determinants of Health (SDOH) Interventions    Readmission Risk Interventions    02/15/2022    9:01 AM 02/07/2022   12:17 PM  Readmission Risk Prevention Plan  Transportation Screening Complete Complete  PCP or Specialist Appt within 5-7 Days  Complete  PCP or Specialist Appt within 3-5 Days Complete   Home Care Screening  Complete  Medication Review (RN CM)  Complete  HRI or Home Care Consult Complete   Social Work Consult for Recovery Care Planning/Counseling Complete   Palliative Care Screening Not Applicable   Medication Review Oceanographer) Complete

## 2022-03-31 NOTE — Progress Notes (Signed)
HD#9 Subjective:   Summary: [name] is a 61 y.o. with a pertinent PMH of HFpEF, COPD, GERD, HLD, HTN, hypothyroidism, OSA on CPAP, PVD, sinus bradycardia s/p pacemaker, and insulin-dependent type 2 diabetes who presented with hypotension and admitted for sepsis and malnutrition.   Overnight Events: none  [name] was awake but having pain with her back wound. Alert but oriented to self only. She will only answer a few questions.     Objective:  Vital signs in last 24 hours: Vitals:   03/31/22 0338 03/31/22 0433 03/31/22 0832 03/31/22 1209  BP: (!) 142/60  135/62 (!) 125/43  Pulse: 76  68 74  Resp: 17  17 18   Temp: 97.8 F (36.6 C)  99.2 F (37.3 C) 98.4 F (36.9 C)  TempSrc: Oral  Oral Oral  SpO2: 98%  96% 100%  Weight:  100 kg    Height:         Physical Exam:  Constitutional: alert, in no acute distress HENT: normocephalic atraumatic  Neck: supple Cardiovascular: regular rate and rhythm Extremities: Left BKA, Right AKA on 8/9 Neurological: awake but oriented to self only Skin: Sacral wound has some bleeding after removing the gauze but no surround erythema or purulent drainage. Does not appear worse than prior exam.  Psych: normal mood  Filed Weights   03/29/22 1256 03/30/22 0500 03/31/22 0433  Weight: 98 kg 98.6 kg 100 kg     Intake/Output Summary (Last 24 hours) at 03/31/2022 1434 Last data filed at 03/31/2022 0900 Gross per 24 hour  Intake 1070 ml  Output 575 ml  Net 495 ml    Net IO Since Admission: -7,677.54 mL [03/31/22 1434]  Pertinent Labs:    Latest Ref Rng & Units 03/31/2022    3:29 AM 03/30/2022    7:50 AM 03/29/2022    5:03 AM  CBC  WBC 4.0 - 10.5 K/uL 10.0  9.6  7.2   Hemoglobin 12.0 - 15.0 g/dL 05/29/2022  16.1  09.6   Hematocrit 36.0 - 46.0 % 33.6  36.1  36.0   Platelets 150 - 400 K/uL 195  207  207        Latest Ref Rng & Units 03/31/2022    3:29 AM 03/30/2022    7:50 AM 03/29/2022    5:03 AM  CMP  Glucose 70 - 99 mg/dL 05/29/2022  409   811   BUN 6 - 20 mg/dL 42  34  27   Creatinine 0.44 - 1.00 mg/dL 914  7.82  9.56   Sodium 135 - 145 mmol/L 134  135  136   Potassium 3.5 - 5.1 mmol/L 5.0  4.6  4.2   Chloride 98 - 111 mmol/L 105  107  107   CO2 22 - 32 mmol/L 22  20  19    Calcium 8.9 - 10.3 mg/dL 9.0  9.4  9.4     Imaging: No results found.  Assessment/Plan:   Principal Problem:   AKI (acute kidney injury) (HCC) Active Problems:   Uncontrolled type 2 diabetes mellitus with hyperglycemia (HCC)   Dehiscence of amputation stump of right lower extremity (HCC)   Sacral wound   Severe protein-calorie malnutrition (HCC)   Patient Summary: [name] is a 61 y.o. with a pertinent PMH of HFpEF, COPD, GERD, HLD, HTN, hypothyroidism, OSA on CPAP, PVD, sinus bradycardia s/p pacemaker, and insulin-dependent type 2 diabetes who presented with hypotension and admitted for sepsis and malnutrition.    Right LE  Osteomyelitis s/p Right AKA P/O day 2. Patient presented with hypotension, leukocytosis and AKI. Noted to have right LE ulceration with bony exposure. Ortho performed right AKA on 8/9. Vancomycin and cefepime were stopped, total 7 day course. BC showed no growth. Appreciate ortho assistance.  -continue wound care  Malnutrition Feeding tube in place. 1000 ml at 50 ml/hr continuous today. Increased free water to 45cc every 4 hours. On exam, noted to have bowel movement. Given her glucose has been in 300s, will discuss with RD about changing her tube feedings. -consult RD about changing tube feeding to diabetic feeding diet -continue SSI -increase semglee to 20 units daily -chloraseptic spray for sore throat -miralax and senna via feeding tube  Insulin-dependent type 2 diabetes mellitus Episodic Hypoglycemia secondary to malnutrition Episodic hypoglycemia could be from poor po intake. She has cortrak in place and no new hypoglycemia. On SSI. Recent glucose readings in 300s. Will change her tube feeding and adjust  long acting insulin.   -CBG every 4 hours -see above for insulin regimen  Hypotension BP is mostly in target range. Sepsis vs malnutrition. MAPs continue to be >70. Feeding tube in place. Continue to monitor. -MAP goal above 65, start fluids if needed  AKI on CKD 3B Chronic Foley catheter Baseline creatinine of 1.5 from prior hospitalization. AKI improving since admission. Likely consider prerenal etiology from hypotension and poor p.o. intake.  -BMP -strict I/O -Continue foley care -if worsening renal function, consider nephrology referral  Chronic sacral wound She reports chronic pain from the sacral wound. Wound care is following. Concern for poor wound healing given malnutrition and poor glycemic control.  Appreciate wound care recs. On exam today, wound is bleeding after removing guaze but no surround erythema or drainage. Not worsening since previous exam.  -Continue wound care -Continue home Tylenol and oxycodone pain regimen  Hypokalemia Potassium at 5 today.  Magnesium was 1.9. Continue to monitor.  HFpEF 2017 Echo showed EF of 65-70%.    Normocytic anemia  Anemia of chronic disease in setting of multiple chronic wounds. Right AKA done on 8/9, transfused 2 units PRBC prior to surgery. Hgb steady.   Hx of hypertension Hold lisinopril and carvedilol in setting of hypotension and AKI  Hypothyroidism TSH was 0.920 today. Will continue levothyroxine.  HLD Continue home rosuvastatin 40mg .  OSA CPAP qhs  GERD Continue Protonix 40 mg daily.  Advance Care Planning Per prior discussion with Dawn and , they believe that the patient if alert and oriented would want full scope of care including surgery for the osteomyelitis. This was discussed with the IM team along with Palliative Care. However, after repeat palliative care discussion, the family would like a meeting to revisit goals of care this upcoming weekend.   Diet: Feeding Tube (50 ml/hr continuous) IVF:  None,None VTE: Enoxaparin  Code: Full   Dispo: Anticipated discharge to nursing facility pending medically stable.   [name], DO Internal Medicine Resident PGY-1 Please contact the on call pager after 5 pm and on weekends at [phone].

## 2022-03-31 NOTE — Plan of Care (Signed)
  Problem: Education: Goal: Knowledge of General Education information will improve Description: Including pain rating scale, medication(s)/side effects and non-pharmacologic comfort measures Outcome: Progressing   Problem: Health Behavior/Discharge Planning: Goal: Ability to manage health-related needs will improve Outcome: Progressing   Problem: Clinical Measurements: Goal: Ability to maintain clinical measurements within normal limits will improve Outcome: Progressing Goal: Will remain free from infection Outcome: Progressing Goal: Diagnostic test results will improve Outcome: Progressing Goal: Respiratory complications will improve Outcome: Progressing Goal: Cardiovascular complication will be avoided Outcome: Progressing   Problem: Activity: Goal: Risk for activity intolerance will decrease Outcome: Progressing   Problem: Nutrition: Goal: Adequate nutrition will be maintained Outcome: Progressing   Problem: Coping: Goal: Level of anxiety will decrease Outcome: Progressing   Problem: Elimination: Goal: Will not experience complications related to bowel motility Outcome: Progressing Goal: Will not experience complications related to urinary retention Outcome: Progressing   Problem: Pain Managment: Goal: General experience of comfort will improve Outcome: Progressing   Problem: Safety: Goal: Ability to remain free from injury will improve Outcome: Progressing   Problem: Skin Integrity: Goal: Risk for impaired skin integrity will decrease Outcome: Progressing   Problem: Education: Goal: Ability to describe self-care measures that may prevent or decrease complications (Diabetes Survival Skills Education) will improve Outcome: Progressing Goal: Individualized Educational Video(s) Outcome: Progressing   Problem: Coping: Goal: Ability to adjust to condition or change in health will improve Outcome: Progressing   Problem: Fluid Volume: Goal: Ability to  maintain a balanced intake and output will improve Outcome: Progressing   Problem: Health Behavior/Discharge Planning: Goal: Ability to identify and utilize available resources and services will improve Outcome: Progressing Goal: Ability to manage health-related needs will improve Outcome: Progressing   Problem: Metabolic: Goal: Ability to maintain appropriate glucose levels will improve Outcome: Progressing   Problem: Nutritional: Goal: Maintenance of adequate nutrition will improve Outcome: Progressing Goal: Progress toward achieving an optimal weight will improve Outcome: Progressing   Problem: Skin Integrity: Goal: Risk for impaired skin integrity will decrease Outcome: Progressing   Problem: Tissue Perfusion: Goal: Adequacy of tissue perfusion will improve Outcome: Progressing   Problem: Education: Goal: Knowledge of the prescribed therapeutic regimen will improve Outcome: Progressing Goal: Ability to verbalize activity precautions or restrictions will improve Outcome: Progressing Goal: Understanding of discharge needs will improve Outcome: Progressing   Problem: Activity: Goal: Ability to perform//tolerate increased activity and mobilize with assistive devices will improve Outcome: Progressing   Problem: Clinical Measurements: Goal: Postoperative complications will be avoided or minimized Outcome: Progressing   Problem: Self-Care: Goal: Ability to meet self-care needs will improve Outcome: Progressing   Problem: Self-Concept: Goal: Ability to maintain and perform role responsibilities to the fullest extent possible will improve Outcome: Progressing   Problem: Pain Management: Goal: Pain level will decrease with appropriate interventions Outcome: Progressing   Problem: Skin Integrity: Goal: Demonstration of wound healing without infection will improve Outcome: Progressing   

## 2022-03-31 NOTE — Progress Notes (Signed)
Nutrition Follow-up  DOCUMENTATION CODES:   Not applicable  INTERVENTION:   Tube feeds via Cortrak: Transition to Glucerna 1.5 at 50 mL/hr (1200 mL/day) 150 mL free water flush q4h Provides 1880 kcal, 97 gm of protein, and 1811 mL total free water daily. Continue Multivitamin w/ minerals daily Continue Juven BID, each packet provides 95 calories, 2.5 grams of protein to support wound healing  NUTRITION DIAGNOSIS:   Increased nutrient needs related to wound healing as evidenced by estimated needs. - Ongoing   GOAL:   Patient will meet greater than or equal to 90% of their needs - Ongoing   MONITOR:   PO intake, Supplement acceptance, Labs, TF tolerance, I & O's  REASON FOR ASSESSMENT:   Malnutrition Screening Tool, Consult Assessment of nutrition requirement/status  ASSESSMENT:   Pt admitted with hypotension, found to have AKI. PMH significant for CHF, COPD, GERD, HLD, HTN, hypothyroidism, OSA on CPAP, PVD, sinus bradycardia s/p PPM, T2DM. Recent admission 6/18-6/27 for sacral wound requiring I&D.   8/07 - Cortrak placed (tip antrum or pyloric region) 8/09 - s/p R AKA  Pt laying in bed, RN administering medications via Cortrak at time of RD visit. RN reports no signs of intolerance to tube feeds.   MD reached out to RD to change tube feeds to lower carbohydrate formula due to consistently high CBGs. Pt currently only on SSI and was started on long-acting insulin yesterday. DM coordinator note recommends adding NovoLog q4h for tube feeding coverage.   Medications reviewed and include: Vitamin C, Colace, NovoLog SSI, Semglee, MVI, Protonix, Miralax, Senokot, Zinc Labs reviewed: Sodium 134, BUN 42, Creatinine 1.94, 24 hr CBG 300-388  Diet Order:   Diet Order             Diet Carb Modified Fluid consistency: Thin; Room service appropriate? Yes  Diet effective now                   EDUCATION NEEDS:   Not appropriate for education at this time  Skin:  Skin  Assessment: Skin Integrity Issues: Skin Integrity Issues:: Other (Comment) Other: WOC-Right BKA posterior wound is circular, yellow, black and pink with scant purulent drainage, measuring 3.5 x 2; Sacral wound measures 12.5 x 11 x 3.5 with rolled edges and boggy pink protruding tissue Unstageable: L buttocks  Last BM:  Unknown?  Height:   Ht Readings from Last 1 Encounters:  03/29/22 5\' 5"  (1.651 m)    Weight:   Wt Readings from Last 1 Encounters:  03/31/22 100 kg    BMI:  Body mass index is 36.69 kg/m.  Estimated Nutritional Needs:   Kcal:  1800-2000  Protein:  90-105 grams  Fluid:  >/= 1.8 L    05/31/22 RD, LDN Clinical Dietitian See [facility] for contact information.

## 2022-03-31 NOTE — Inpatient Diabetes Management (Addendum)
Inpatient Diabetes Program Recommendations  AACE/ADA: New Consensus Statement on Inpatient Glycemic Control (2015)  Target Ranges:  Prepandial:   less than 140 mg/dL      Peak postprandial:   less than 180 mg/dL (1-2 hours)      Critically ill patients:  140 - 180 mg/dL   Lab Results  Component Value Date   GLUCAP 379 (H) 03/31/2022   HGBA1C 12.6 (H) 02/06/2022    Latest Reference Range & Units 03/30/22 16:00 03/30/22 19:57 03/30/22 23:30 03/31/22 03:40 03/31/22 08:35  Glucose-Capillary 70 - 99 mg/dL 253 (H) 664 (H) 403 (H) 300 (H) 379 (H)  (H): Data is abnormally high Review of Glycemic Control  Diabetes history: type 2 Outpatient Diabetes medications: Basaglar 20 units daily, Amaryl 4 mg BID, Farxiga 10 mg daily Current orders for Inpatient glycemic control: Semglee 10 units daily, Novolog 0-9 units every 4 hours  Inpatient Diabetes Program Recommendations:   Noted that blood sugars have been greater than 300 mg/dl. Recommend increasing Semglee to 20 units daily. May need to add Novolog 5 units every 4 hours if remains on tube feedings and blood sugars continue to be elevated.  Smith Mince RN BSN CDE Diabetes Coordinator Pager: [phone]  8am-5pm

## 2022-04-01 DIAGNOSIS — Z515 Encounter for palliative care: Secondary | ICD-10-CM | POA: Diagnosis not present

## 2022-04-01 DIAGNOSIS — N179 Acute kidney failure, unspecified: Secondary | ICD-10-CM | POA: Diagnosis not present

## 2022-04-01 DIAGNOSIS — Z66 Do not resuscitate: Secondary | ICD-10-CM | POA: Diagnosis not present

## 2022-04-01 DIAGNOSIS — Z7189 Other specified counseling: Secondary | ICD-10-CM | POA: Diagnosis not present

## 2022-04-01 LAB — BASIC METABOLIC PANEL
Anion gap: 9 (ref 5–15)
Anion gap: 10 (ref 5–15)
Anion gap: 8 (ref 5–15)
BUN: 56 mg/dL — ABNORMAL HIGH (ref 6–20)
BUN: 76 mg/dL — ABNORMAL HIGH (ref 6–20)
BUN: 77 mg/dL — ABNORMAL HIGH (ref 6–20)
CO2: 22 mmol/L (ref 22–32)
CO2: 25 mmol/L (ref 22–32)
CO2: 25 mmol/L (ref 22–32)
Calcium: 9.1 mg/dL (ref 8.9–10.3)
Calcium: 9.5 mg/dL (ref 8.9–10.3)
Calcium: 9.3 mg/dL (ref 8.9–10.3)
Chloride: 100 mmol/L (ref 98–111)
Chloride: 97 mmol/L — ABNORMAL LOW (ref 98–111)
Chloride: 98 mmol/L (ref 98–111)
Creatinine, Ser: 2.4 mg/dL — ABNORMAL HIGH (ref 0.44–1.00)
Creatinine, Ser: 2.6 mg/dL — ABNORMAL HIGH (ref 0.44–1.00)
Creatinine, Ser: 2.57 mg/dL — ABNORMAL HIGH (ref 0.44–1.00)
GFR, Estimated: 23 mL/min — ABNORMAL LOW (ref >60)
GFR, Estimated: 20 mL/min — ABNORMAL LOW (ref >60)
GFR, Estimated: 21 mL/min — ABNORMAL LOW (ref >60)
Glucose, Bld: 193 mg/dL — ABNORMAL HIGH (ref 70–99)
Glucose, Bld: 249 mg/dL — ABNORMAL HIGH (ref 70–99)
Glucose, Bld: 168 mg/dL — ABNORMAL HIGH (ref 70–99)
Potassium: 5.2 mmol/L — ABNORMAL HIGH (ref 3.5–5.1)
Potassium: 6 mmol/L — ABNORMAL HIGH (ref 3.5–5.1)
Potassium: 4.9 mmol/L (ref 3.5–5.1)
Sodium: 131 mmol/L — ABNORMAL LOW (ref 135–145)
Sodium: 132 mmol/L — ABNORMAL LOW (ref 135–145)
Sodium: 131 mmol/L — ABNORMAL LOW (ref 135–145)

## 2022-04-01 LAB — GLUCOSE, CAPILLARY
Glucose-Capillary: 120 mg/dL — ABNORMAL HIGH (ref 70–99)
Glucose-Capillary: 127 mg/dL — ABNORMAL HIGH (ref 70–99)
Glucose-Capillary: 153 mg/dL — ABNORMAL HIGH (ref 70–99)
Glucose-Capillary: 196 mg/dL — ABNORMAL HIGH (ref 70–99)
Glucose-Capillary: 202 mg/dL — ABNORMAL HIGH (ref 70–99)
Glucose-Capillary: 236 mg/dL — ABNORMAL HIGH (ref 70–99)
Glucose-Capillary: 244 mg/dL — ABNORMAL HIGH (ref 70–99)
Glucose-Capillary: 262 mg/dL — ABNORMAL HIGH (ref 70–99)

## 2022-04-01 LAB — CBC
HCT: 30.7 % — ABNORMAL LOW (ref 36.0–46.0)
Hemoglobin: 10.2 g/dL — ABNORMAL LOW (ref 12.0–15.0)
MCH: 29.1 pg (ref 26.0–34.0)
MCHC: 33.2 g/dL (ref 30.0–36.0)
MCV: 87.7 fL (ref 80.0–100.0)
Platelets: 198 10*3/uL (ref 150–400)
RBC: 3.5 MIL/uL — ABNORMAL LOW (ref 3.87–5.11)
RDW: 15.9 % — ABNORMAL HIGH (ref 11.5–15.5)
WBC: 11.4 10*3/uL — ABNORMAL HIGH (ref 4.0–10.5)
nRBC: 0 % (ref 0.0–0.2)

## 2022-04-01 MED ORDER — SODIUM ZIRCONIUM CYCLOSILICATE 10 G PO PACK
10.0000 g | PACK | Freq: Once | ORAL | Status: AC
  Administered 2022-04-01: 10 g via ORAL
  Filled 2022-04-01: qty 1

## 2022-04-01 MED ORDER — CALCIUM GLUCONATE-NACL 1-0.675 GM/50ML-% IV SOLN
1.0000 g | Freq: Once | INTRAVENOUS | Status: AC
Start: 2022-04-01 — End: 2022-04-02
  Administered 2022-04-01: 1000 mg via INTRAVENOUS
  Filled 2022-04-01: qty 50

## 2022-04-01 MED ORDER — CALCIUM GLUCONATE 10 % IV SOLN: 1.0000 g | Freq: Once | INTRAVENOUS | Status: DC

## 2022-04-01 MED ORDER — INSULIN ASPART 100 UNIT/ML IV SOLN
5.0000 [IU] | Freq: Once | INTRAVENOUS | Status: AC
  Administered 2022-04-01: 5 [IU] via INTRAVENOUS

## 2022-04-01 MED ORDER — LACTATED RINGERS IV BOLUS
1000.0000 mL | Freq: Once | INTRAVENOUS | Status: AC
  Administered 2022-04-01: 1000 mL via INTRAVENOUS

## 2022-04-01 MED ORDER — FREE WATER
150.0000 mL | Status: DC
Start: 2022-04-01 — End: 2022-04-02
  Administered 2022-04-01 – 2022-04-02 (×9): 150 mL

## 2022-04-01 MED ORDER — LACTATED RINGERS IV SOLN: INTRAVENOUS | Status: AC

## 2022-04-01 NOTE — Progress Notes (Signed)
Palliative Medicine Inpatient Follow Up Note  HPI: 61 year old with PMH history of HFpEF, COPD, GERD, HLD, HTN, hypothyroidism , OSA on CPAP, PVD, sinus tachycardia s/p pacemaker, and IDDM who has a chronic sacral wound that was I &D with hospitalization 6/18-2/27, right BKA with exposed bone and poor wound healing who presented with hypotension and admitted for sepsis and malnutrition. [name] has been ordered. Ortho has consulted for right BKA repair. Per note patient has shared she did want to have additional surgery to stump.   Today's Discussion 04/01/2022  *Please note that [name] is a verbal dictation therefore any spelling or grammatical errors are due to the "[employer]" system interpretation.  Chart reviewed inclusive of vital signs, progress notes, laboratory results, and diagnostic images.   A family meeting was held [name] afternoon with patients sister, Nichole [name] and brother, Nichole [name] over the phone. Both Dr. Evie Lacks and myself were present.   Discussed patients present ailments inclusive of her sacral wound and amputation revision. Discussed patients poor wound healing and malnutrition. We discussed that overall she has a very poor likelihood of recovery back to her prior level of functioning.  A great deal of focus during our time was placed on what quality of life is to Nichole [name]. Per her brother and sister she liked living by herself in her apartment, self propelling on her wheelchair, and being able to smoke cigarettes. If she is unable to do these things then her quality of life would be identified as poor.   In regard to patients cognition she was functioning fairly well until roughly on week preceding hospitalization though per [name] she was noted to be hallucinating per their niece.  Nichole [name] as there has always appeared to be a level of disconnect.  Discussed that given patients poor health sate 10 days  into hospitalization there is great concern that she may not improve beyond [name] point. Discussed if that were the case then it's important to start considering goals and making tough decisions.  Reviewed patients cardiopulmonary resuscitation status.  Discussed the trauma that could potentially be endured as a result of a CPR attempt.  Discussed that even in a younger patient with no core morbidities it could be a relatively violent occurrence.  I shared with Nichole [name] how she is now I would worry that she would survive the event at all and if she did that she would be in a much worse state than she is now.  Both patient's Sister Nichole [name] and Nichole [name] discussed [name] over the phone and were both in agreement with a DNAR/DNI CODE STATUS.  They do not feel she would want to be on artificial measures of life support.  We further review if Nichole [name] would ever want something like artificial nutrition which she does have presently for the short-term.  There was conversation about how Nichole [name] had set sated from eating and that if she had a long-term gastrostomy tube that [name] would be keeping her alive but not adding quality to her life.  [name] was a concept which I agreed with and I shared in all honesty it would be another measure of life support.  Nichole [name] both agree that Nichole [name] would not want artificial nutrition.  We discussed that given Nichole [name] state is very reasonable to allow more time to see if she could improve beyond [name] though if we are another week in and she  is in a similar position then it would be worthwhile to discuss comfort oriented care and hospice.  Patient's Sister Nichole [name] and brother Nichole [name] are both in agreement with [name].  Plan for a time trial of treatments over the next week and if no improvements are made further conversations will be held.  Palliative support offered.  Objective Assessment: Vital Signs Vitals:   04/01/22 1500 04/01/22 1525  BP:  137/64  Pulse: 79 71  Resp: 18 18  Temp:  98.4 F  (36.9 C)  SpO2: 100% 100%    Intake/Output Summary (Last 24 hours) at 04/01/2022 1609 Last data filed at 04/01/2022 1521 Gross per 24 hour  Intake 1832.5 ml  Output 1725 ml  Net 107.5 ml    Last Weight  Most recent update: 03/31/2022  4:33 AM    Weight  100 kg (220 lb 7.4 oz)            Gen:  Middle aged caucasian F, chronically ill appearing HEENT: Coretrack, dry mucous membranes CV: Regular rate and rhythm  PULM:  On RA, breathing is even and nonlabored ABD: soft/nontender/  EXT: BLE amputation wound vac on RLE Neuro: Alert and oriented to self  SUMMARY OF RECOMMENDATIONS   DNAR/DNI   No long-term tube feeding  Plan for 7-day trial of continued support to identify if improvements are made if no improvements are made additional conversations in the oncoming week pertaining to comfort and hospice care  Ongoing support  Total Time: 43 Billing based on MDM: High ______________________________________________________________________________________ Nichole [name] Palliative Medicine Team Team Cell Phone: [phone] Please utilize secure chat with additional questions, if there is no response within 30 minutes please call the above phone number  Palliative Medicine Team providers are available by phone from 7am to 7pm daily and can be reached through the team cell phone.  Should [name] patient require assistance outside of these hours, please call the patient's attending physician.

## 2022-04-01 NOTE — Progress Notes (Signed)
HD#10 Subjective:   Summary:  [name] is a 61 y.o. with a pertinent PMH of HFpEF, COPD, GERD, HLD, HTN, hypothyroidism, OSA on CPAP, PVD, sinus bradycardia s/p pacemaker, and insulin-dependent type 2 diabetes who presented with hypotension and admitted for osteomyelitis and malnutrition.  Overnight Events: no acute events overnight   Resting in bed comfortably. Denies any pain. No acute concerns this morning. Not able to tell me situation, place, or time. Able to identify self. Family meeting today with patient's sister [name] present, her brother [name] on the phone, and palliative care team member Tacey Ruiz NP present. Sharyn Lull led the discussion, including conversations about [name]'s current state, her continued encephalopathy, and what the family feels [name]'s wishes would be. Both [name] and Dawn express concern in regards to her current state and lack of improvement. They do not believe patient would not want artifical measures moving forward. Agreed to transition patient to DNR and give a trial of 7 days to see if any improvement can be made.   Objective:  Vital signs in last 24 hours: Vitals:   04/01/22 0340 04/01/22 0345 04/01/22 0350 04/01/22 0355  BP:   (!) 131/57   Pulse: 79 75 77 75  Resp: '19 15 19 15  ' Temp:   97.9 F (36.6 C)   TempSrc:   Oral   SpO2: 98% 96% 100% 98%  Weight:      Height:       Supplemental O2: Room Air SpO2: 98 %   Physical Exam:  Constitutional: ill appearing HENT: normocephalic atraumatic, NG tube in place Neck: supple.  Cardiovascular: regular rate and rhythm, no m/r/g Pulmonary/Chest: normal work of breathing on room air MSK: normal bulk and tone Neurological: alert & oriented to self Skin: warm and dry Psych: normal mood  Filed Weights   03/29/22 1256 03/30/22 0500 03/31/22 0433  Weight: 98 kg 98.6 kg 100 kg     Intake/Output Summary (Last 24 hours) at 04/01/2022 0804 Last data filed at 04/01/2022 0600 Gross per 24 hour   Intake 95 ml  Output 1350 ml  Net -1255 ml   Net IO Since Admission: -9,027.54 mL [04/01/22 0804]  Pertinent Labs:    Latest Ref Rng & Units 04/01/2022    3:25 AM 03/31/2022    3:29 AM 03/30/2022    7:50 AM  CBC  WBC 4.0 - 10.5 K/uL 11.4  10.0  9.6   Hemoglobin 12.0 - 15.0 g/dL 10.2  10.7  11.8   Hematocrit 36.0 - 46.0 % 30.7  33.6  36.1   Platelets 150 - 400 K/uL 198  195  207        Latest Ref Rng & Units 04/01/2022    3:25 AM 03/31/2022    3:29 AM 03/30/2022    7:50 AM  CMP  Glucose 70 - 99 mg/dL 193  298  345   BUN 6 - 20 mg/dL 56  42  34   Creatinine 0.44 - 1.00 mg/dL 2.40  1.94  1.93   Sodium 135 - 145 mmol/L 131  134  135   Potassium 3.5 - 5.1 mmol/L 5.2  5.0  4.6   Chloride 98 - 111 mmol/L 100  105  107   CO2 22 - 32 mmol/L '22  22  20   ' Calcium 8.9 - 10.3 mg/dL 9.1  9.0  9.4     Imaging: No results found.  Assessment/Plan:   Principal Problem:   AKI (acute kidney injury) ([place]) Active  Problems:   Uncontrolled type 2 diabetes mellitus with hyperglycemia (HCC)   Dehiscence of amputation stump of right lower extremity (HCC)   Sacral wound   Severe protein-calorie malnutrition [facility])  Patient Summary: [name] is a 61 y.o. with a pertinent PMH of HFpEF, COPD, GERD, HLD, HTN, hypothyroidism, OSA on CPAP, PVD, sinus bradycardia s/p pacemaker, and insulin-dependent type 2 diabetes who presented with hypotension and admitted for osteomyelitis and malnutrition.  Advance Care Planning Palliative care and myself met with patient, patient's sister [name], and brother [name] on the phone. In depth discussion in regards to what patient's wishes would be. They do not believe she would want artifical treatments. Decided one week trial treatment to see if patient has improvement. They do not believe she would want CPR, intubation, defibrillation, or further advanced measures if patient were to need them. She has transitioned to DNR status.  -Transition to DNR -re-evaluate in  one week, continue full measures aside from DNR status.   Acute on chronic kidney injury 3B Elevated BUN Hyperkalemia Patient with worsening hyperkalemia. Afternoon repeat of 6.0 with EKG changes. Hyperkalemia protocol in place, calcium, lokelma, insulin and glucose given. LR bolus given and will continue with LR infusion.  -repeat EKG and BMP in 2 hours.  -hyperkalemia treatment as per above  Encephalopathy Likely multi-factorial with history of severe malnutrition, chronic wounds, RLE osteomyelitis s/p amputation, and worsening renal function. She has had up-trending BUN today. Goals of care discussion as per above, patient alert and oriented to only self today. Will continue full scope of care measures aside from DNR status. -continue to treat reversible factors.    Right LE Osteomyelitis s/p Right AKA P/O day 3. No acute complications post operatively, wound vacc in place. Continues to be encephalopathic from multiple etiologies.  -continue wound care   Malnutrition Coretrek in place, holding feeds with up-trending BUN and potassium.  -hold feeds   Insulin-dependent type 2 diabetes mellitus Episodic Hypoglycemia secondary to malnutrition Episodic hypoglycemia from poor PO intake. Holding tube feeds with hyperkalemia. Continue CBG every 4 hours to monitor for hypoglycemia  Hypotension BP is mostly in target range. Sepsis vs malnutrition. MAPs continue to be >70. Feeding tube in place. Continue to monitor. -MAP goal above 65, start fluids if needed  Chronic sacral wound Wound care management, continue to try and keep it clean with pressure offloading.  -Continue wound care -Continue home Tylenol and oxycodone pain regimen   GERD Continue Protonix 40 mg daily.   Diet:  holding feeds IVF: LR,75cc/hr VTE: Enoxaparin Code: Full  Dispo: Anticipated discharge pending further management of acute problems above and ongoing family discussions  [place] Internal  Medicine Resident PGY-3 Please contact the on call pager after 5 pm and on weekends at [phone].

## 2022-04-02 DIAGNOSIS — E46 Unspecified protein-calorie malnutrition: Secondary | ICD-10-CM | POA: Diagnosis not present

## 2022-04-02 DIAGNOSIS — N1832 Chronic kidney disease, stage 3b: Secondary | ICD-10-CM | POA: Diagnosis not present

## 2022-04-02 DIAGNOSIS — Z515 Encounter for palliative care: Secondary | ICD-10-CM | POA: Diagnosis not present

## 2022-04-02 DIAGNOSIS — N179 Acute kidney failure, unspecified: Secondary | ICD-10-CM | POA: Diagnosis not present

## 2022-04-02 DIAGNOSIS — E875 Hyperkalemia: Secondary | ICD-10-CM | POA: Diagnosis not present

## 2022-04-02 LAB — BASIC METABOLIC PANEL
Anion gap: 9 (ref 5–15)
BUN: 72 mg/dL — ABNORMAL HIGH (ref 6–20)
CO2: 26 mmol/L (ref 22–32)
Calcium: 9.5 mg/dL (ref 8.9–10.3)
Chloride: 99 mmol/L (ref 98–111)
Creatinine, Ser: 2.44 mg/dL — ABNORMAL HIGH (ref 0.44–1.00)
GFR, Estimated: 22 mL/min — ABNORMAL LOW (ref >60)
Glucose, Bld: 95 mg/dL (ref 70–99)
Potassium: 4.4 mmol/L (ref 3.5–5.1)
Sodium: 134 mmol/L — ABNORMAL LOW (ref 135–145)

## 2022-04-02 LAB — CBC WITH DIFFERENTIAL/PLATELET
Abs Immature Granulocytes: 0.05 10*3/uL (ref 0.00–0.07)
Basophils Absolute: 0 10*3/uL (ref 0.0–0.1)
Basophils Relative: 0 %
Eosinophils Absolute: 0.1 10*3/uL (ref 0.0–0.5)
Eosinophils Relative: 1 %
HCT: 29.5 % — ABNORMAL LOW (ref 36.0–46.0)
Hemoglobin: 9.5 g/dL — ABNORMAL LOW (ref 12.0–15.0)
Immature Granulocytes: 1 %
Lymphocytes Relative: 40 %
Lymphs Abs: 3.3 10*3/uL (ref 0.7–4.0)
MCH: 27.9 pg (ref 26.0–34.0)
MCHC: 32.2 g/dL (ref 30.0–36.0)
MCV: 86.8 fL (ref 80.0–100.0)
Monocytes Absolute: 0.6 10*3/uL (ref 0.1–1.0)
Monocytes Relative: 7 %
Neutro Abs: 4.1 10*3/uL (ref 1.7–7.7)
Neutrophils Relative %: 51 %
Platelets: 216 10*3/uL (ref 150–400)
RBC: 3.4 MIL/uL — ABNORMAL LOW (ref 3.87–5.11)
RDW: 15.6 % — ABNORMAL HIGH (ref 11.5–15.5)
WBC: 8.1 10*3/uL (ref 4.0–10.5)
nRBC: 0 % (ref 0.0–0.2)

## 2022-04-02 LAB — GLUCOSE, CAPILLARY
Glucose-Capillary: 78 mg/dL (ref 70–99)
Glucose-Capillary: 115 mg/dL — ABNORMAL HIGH (ref 70–99)
Glucose-Capillary: 97 mg/dL (ref 70–99)
Glucose-Capillary: 134 mg/dL — ABNORMAL HIGH (ref 70–99)
Glucose-Capillary: 139 mg/dL — ABNORMAL HIGH (ref 70–99)
Glucose-Capillary: 160 mg/dL — ABNORMAL HIGH (ref 70–99)

## 2022-04-02 LAB — POTASSIUM
Potassium: 4.2 mmol/L (ref 3.5–5.1)
Potassium: 4.7 mmol/L (ref 3.5–5.1)

## 2022-04-02 MED ORDER — PROSOURCE TF20 ENFIT COMPATIBL EN LIQD
60.0000 mL | Freq: Every day | ENTERAL | Status: DC
  Administered 2022-04-02 – 2022-04-06 (×5): 60 mL
  Filled 2022-04-02 (×5): qty 60

## 2022-04-02 MED ORDER — NYSTATIN 100000 UNIT/ML MT SUSP
5.0000 mL | Freq: Four times a day (QID) | OROMUCOSAL | Status: DC
Start: 2022-04-02 — End: 2022-04-03
  Administered 2022-04-02 – 2022-04-03 (×5): 500000 [IU] via ORAL
  Filled 2022-04-02 (×5): qty 5

## 2022-04-02 MED ORDER — LACTATED RINGERS IV SOLN: INTRAVENOUS | Status: AC

## 2022-04-02 MED ORDER — NEPRO/CARBSTEADY PO LIQD
1000.0000 mL | ORAL | Status: DC
  Administered 2022-04-02: 1000 mL
  Filled 2022-04-02 (×3): qty 1000

## 2022-04-02 MED ORDER — FREE WATER
30.0000 mL | Status: DC
  Administered 2022-04-02 – 2022-04-03 (×5): 30 mL

## 2022-04-02 NOTE — Progress Notes (Signed)
Patient ID: [name], female   DOB: 1960-08-30, 61 y.o.   MRN: [id number] Right BKA good VAC seal. Tube feeding.

## 2022-04-02 NOTE — Progress Notes (Addendum)
HD#11 Subjective:   Summary:  [name] is a 61 y.o. with a pertinent PMH of HFpEF, COPD, GERD, HLD, HTN, hypothyroidism, OSA on CPAP, PVD, sinus bradycardia s/p pacemaker, and insulin-dependent type 2 diabetes who presented with hypotension and admitted for osteomyelitis and malnutrition.  Overnight Events: Hyperkalemia with EKG changes. Given lokelma, calcium, and insulin with improvement in potassium and EKG findings. Tube feeds held  Resting in bed comfortably. Denies any pain this morning, continues to be confused.   Objective:  Vital signs in last 24 hours: Vitals:   04/02/22 1100 04/02/22 1115 04/02/22 1130 04/02/22 1200  BP:  (!) 128/50    Pulse: 72 74 70 61  Resp: 17 (!) 25 15 11   Temp:   98.6 F (37 C)   TempSrc:   Oral   SpO2: 100% 100% 100% 99%  Weight:      Height:       Supplemental O2: Room Air SpO2: 99 %  Physical Exam:  Constitutional: ill appearing HENT: normocephalic atraumatic, NG tube in place Neck: supple.  Cardiovascular: regular rate and rhythm, no m/r/g Pulmonary/Chest: normal work of breathing on room air MSK: normal bulk and tone Neurological: alert & oriented to self Skin: warm and dry Psych: normal mood  Filed Weights   03/30/22 0500 03/31/22 0433 04/02/22 0441  Weight: 98.6 kg 100 kg 101 kg     Intake/Output Summary (Last 24 hours) at 04/02/2022 1343 Last data filed at 04/02/2022 1132 Gross per 24 hour  Intake 1660 ml  Output 1925 ml  Net -265 ml    Net IO Since Admission: -8,060.04 mL [04/02/22 1343]  Pertinent Labs:    Latest Ref Rng & Units 04/02/2022    5:14 AM 04/01/2022    3:25 AM 03/31/2022    3:29 AM  CBC  WBC 4.0 - 10.5 K/uL 8.1  11.4  10.0   Hemoglobin 12.0 - 15.0 g/dL 9.5  05/31/2022  53.6   Hematocrit 36.0 - 46.0 % 29.5  30.7  33.6   Platelets 150 - 400 K/uL 216  198  195        Latest Ref Rng & Units 04/02/2022    7:46 AM 04/02/2022    5:14 AM 04/02/2022   12:17 AM  CMP  Glucose 70 - 99 mg/dL 95     BUN 6 -  20 mg/dL 72     Creatinine 04/04/2022 - 1.00 mg/dL 3.15     Sodium 4.00 - 867 mmol/L 134     Potassium 3.5 - 5.1 mmol/L 4.4  4.2  4.7   Chloride 98 - 111 mmol/L 99     CO2 22 - 32 mmol/L 26     Calcium 8.9 - 10.3 mg/dL 9.5      Assessment/Plan:   Principal Problem:   AKI (acute kidney injury) (HCC) Active Problems:   Uncontrolled type 2 diabetes mellitus with hyperglycemia (HCC)   Dehiscence of amputation stump of right lower extremity (HCC)   Sacral wound   Severe protein-calorie malnutrition (HCC)  Patient Summary: [name] is a 61 y.o. with a pertinent PMH of HFpEF, COPD, GERD, HLD, HTN, hypothyroidism, OSA on CPAP, PVD, sinus bradycardia s/p pacemaker, and insulin-dependent type 2 diabetes who presented with hypotension and admitted for osteomyelitis and malnutrition.  Advance Care Planning Family meeting 04/01/22, patient transitioned to DNR. Plan to readdress goals of care after 7 days of treatment. If no improvement in mentation, patient will consider comfort care measures. They do not believe  Shira would want any artificial treatment. Continue full scope of care.   -Transition to DNR -re-evaluate in one week, continue full measures aside from DNR status.   Acute on chronic kidney injury 3B Elevated BUN Hyperkalemia Improvement of hyperkalemia with  insulin, calcium, and lokelma. EKG changes also with improvement. Discussing with RD and RN about low K feeding treatments. AKI likely pre-renal from dehydration, will continue IV fluids. If no improvement consider renal ultraosund.  -repeat EKG and BMP in 2 hours.  -hyperkalemia treatment as per above  Encephalopathy Likely multi-factorial with history of severe malnutrition, chronic wounds, RLE osteomyelitis s/p amputation, and worsening renal function. Goals of care discussion as per above, patient alert and oriented to only self today. Will continue full scope of care measures aside from DNR status. -continue to treat reversible  factors.    Right LE Osteomyelitis s/p Right AKA P/O day 4. No acute complications post operatively, wound vacc in place. Continues to be encephalopathic from multiple etiologies.  -continue wound care   Malnutrition Coretrek in place, working with RD to find feeding regimen low in potassium -resume tube feeds today   Insulin-dependent type 2 diabetes mellitus Episodic Hypoglycemia secondary to malnutrition Continue CBG every 4 hours to monitor for hypoglycemia  Hypotension Improved with management of osteomyelitis and malnutrition  Chronic sacral wound Wound care management, continue to try and keep it clean with pressure offloading. Flexiseal to keep stool out of wound -Continue wound care -Continue home Tylenol and oxycodone pain regimen   GERD Continue Protonix 40 mg daily.  Thrush Noticed by nursing staff today, will treat with nystatin swish   Diet:  holding feeds IVF: LR,75cc/hr 8 hrs VTE: Enoxaparin Code: DNR  Dispo: Anticipated discharge pending further management of acute problems above and ongoing family discussions  Thalia Bloodgood DO Internal Medicine Resident PGY-3 Please contact the on call pager after 5 pm and on weekends at [phone].

## 2022-04-02 NOTE — Progress Notes (Signed)
Nutrition Brief Note  Received page from RN regarding tube feeding. Tube feeding held yesterday d/t hyperkalemia (6.0). MD requesting to adjust tube feeding from Glucerna 1.5 to Nepro given increasing Cr level and potassium levels trending back up despite use of lokelma. Suspect AKI likely pre-renal from dehydration. Started on LR @ 38ml/hr today.   Discussed with MD and RN. Will adjust tube feeding to Nepro today. If no significant improvements noted with change in formulary, would recommend transitioning patient back to Glucerna 1.5.  Tube feeding regimen adjusted: Start Nepro at goal rate of 76ml/hr ( ) 60 ml ProSource TF20 once daily, each supplement provides 80 kcals and 20 grams protein.  Standard free water flushes of 32ml q4h to maintain tube patency TF regimen provides: 1779 kcal, 98g protein, total free water  Drusilla Kanner, RDN, LDN Clinical Nutrition

## 2022-04-02 NOTE — Progress Notes (Addendum)
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 [name] has been ordered. Ortho has consulted for right BKA repair. Per note patient has shared she did want to have additional surgery to stump.   Today's Discussion 04/02/2022  *Please note that this is a verbal dictation therefore any spelling or grammatical errors are due to the "[place] [facility]" system interpretation.  Chart reviewed inclusive of vital signs, progress notes, laboratory results, and diagnostic images.   I met with patient at bedside. She continues to be disoriented incrementally throughout our time together though initially is able to tell me who she is and where she is. She drifts in and out of sleep it appears while talking. She shares that she does feel like she is having pain.   I spoke to patients nurse, Einar Pheasant who shares concerns regarding patients mental state. He reviews that he has had to turn her every two hours and she is stooling into her sacral wound. Discussed the idea of a flexi-seal. He also shares concern regarding feeding tube formula perhaps causing hyperkalemia. I shared that I would bring this up to the primary team and agree that changing formula is appropriate.   No family present at bedside during time of assessment though I plan to call sister Arrie Aran this afternoon.   Palliative support offered. ____________________________________ Addendum:  I met with patients sister, Dawn and brother, Audelia Acton at bedside this afternoon. Discussed patients present health state and provided an update. Patients family feels she is a bit more aware today than on the prior days.    Continue watchful waiting.   Objective Assessment: Vital Signs Vitals:   04/02/22 1130 04/02/22 1200  BP:    Pulse: 70 61  Resp: 15 11  Temp: 98.6 F (37 C)   SpO2: 100% 99%    Intake/Output Summary (Last 24 hours) at 04/02/2022 1248 Last data filed at 04/02/2022 1132 Gross per 24 hour  Intake 1660 ml  Output 1925 ml  Net -265 ml    Last Weight  Most recent update: 04/02/2022  4:41 AM    Weight  101 kg (222 lb 10.6 oz)            Gen:  Middle aged caucasian F, chronically ill appearing HEENT: [place], (+) oral thrush on tongue, dry mucous membranes CV: Regular rate and rhythm  PULM:  On RA, breathing is even and nonlabored ABD: soft/nontender/  EXT: BLE amputation wound vac on RLE Neuro: Alert and oriented to self  SUMMARY OF RECOMMENDATIONS   DNAR/DNI   No long-term tube feeding  Appreciate primary team --> with modification of tube feeding, tx of oral thrush, and possibly ordering flexiseal for frequent loose BM's  Plan for 7-day trial of continued support to identify if improvements are made --> if no improvements are made additional conversations in the oncoming week pertaining to comfort and hospice care  Ongoing support  Billing based on MDM: High ______________________________________________________________________________________ [place] Team Team Cell Phone: [phone] Please utilize secure chat with additional questions, if there is no response within 30 minutes please call the above phone number  Palliative Medicine Team providers are available by phone  from 7am to 7pm daily and can be reached through the team cell phone.  Should this patient require assistance outside of these hours, please call the patient's attending physician.

## 2022-04-03 DIAGNOSIS — T8781 Dehiscence of amputation stump: Secondary | ICD-10-CM | POA: Diagnosis not present

## 2022-04-03 DIAGNOSIS — E875 Hyperkalemia: Secondary | ICD-10-CM | POA: Diagnosis not present

## 2022-04-03 DIAGNOSIS — N179 Acute kidney failure, unspecified: Secondary | ICD-10-CM | POA: Diagnosis not present

## 2022-04-03 DIAGNOSIS — E43 Unspecified severe protein-calorie malnutrition: Secondary | ICD-10-CM | POA: Diagnosis not present

## 2022-04-03 DIAGNOSIS — S31000D Unspecified open wound of lower back and pelvis without penetration into retroperitoneum, subsequent encounter: Secondary | ICD-10-CM | POA: Diagnosis not present

## 2022-04-03 DIAGNOSIS — Z89611 Acquired absence of right leg above knee: Secondary | ICD-10-CM

## 2022-04-03 DIAGNOSIS — M86161 Other acute osteomyelitis, right tibia and fibula: Secondary | ICD-10-CM

## 2022-04-03 DIAGNOSIS — Z7189 Other specified counseling: Secondary | ICD-10-CM | POA: Diagnosis not present

## 2022-04-03 DIAGNOSIS — L8990 Pressure ulcer of unspecified site, unspecified stage: Secondary | ICD-10-CM | POA: Insufficient documentation

## 2022-04-03 DIAGNOSIS — G9340 Encephalopathy, unspecified: Secondary | ICD-10-CM

## 2022-04-03 DIAGNOSIS — E1169 Type 2 diabetes mellitus with other specified complication: Secondary | ICD-10-CM

## 2022-04-03 DIAGNOSIS — B370 Candidal stomatitis: Secondary | ICD-10-CM

## 2022-04-03 DIAGNOSIS — N1832 Chronic kidney disease, stage 3b: Secondary | ICD-10-CM | POA: Diagnosis not present

## 2022-04-03 LAB — BASIC METABOLIC PANEL
Anion gap: 11 (ref 5–15)
BUN: 82 mg/dL — ABNORMAL HIGH (ref 6–20)
CO2: 26 mmol/L (ref 22–32)
Calcium: 9.6 mg/dL (ref 8.9–10.3)
Chloride: 96 mmol/L — ABNORMAL LOW (ref 98–111)
Creatinine, Ser: 2.23 mg/dL — ABNORMAL HIGH (ref 0.44–1.00)
GFR, Estimated: 25 mL/min — ABNORMAL LOW (ref >60)
Glucose, Bld: 232 mg/dL — ABNORMAL HIGH (ref 70–99)
Potassium: 3.8 mmol/L (ref 3.5–5.1)
Sodium: 133 mmol/L — ABNORMAL LOW (ref 135–145)

## 2022-04-03 LAB — GLUCOSE, CAPILLARY
Glucose-Capillary: 172 mg/dL — ABNORMAL HIGH (ref 70–99)
Glucose-Capillary: 178 mg/dL — ABNORMAL HIGH (ref 70–99)
Glucose-Capillary: 180 mg/dL — ABNORMAL HIGH (ref 70–99)
Glucose-Capillary: 224 mg/dL — ABNORMAL HIGH (ref 70–99)
Glucose-Capillary: 257 mg/dL — ABNORMAL HIGH (ref 70–99)
Glucose-Capillary: 296 mg/dL — ABNORMAL HIGH (ref 70–99)

## 2022-04-03 LAB — CBC WITH DIFFERENTIAL/PLATELET
Abs Immature Granulocytes: 0.02 10*3/uL (ref 0.00–0.07)
Basophils Absolute: 0 10*3/uL (ref 0.0–0.1)
Basophils Relative: 0 %
Eosinophils Absolute: 0.2 10*3/uL (ref 0.0–0.5)
Eosinophils Relative: 4 %
HCT: 28.4 % — ABNORMAL LOW (ref 36.0–46.0)
Hemoglobin: 9.2 g/dL — ABNORMAL LOW (ref 12.0–15.0)
Immature Granulocytes: 0 %
Lymphocytes Relative: 39 %
Lymphs Abs: 2.3 10*3/uL (ref 0.7–4.0)
MCH: 28 pg (ref 26.0–34.0)
MCHC: 32.4 g/dL (ref 30.0–36.0)
MCV: 86.6 fL (ref 80.0–100.0)
Monocytes Absolute: 0.6 10*3/uL (ref 0.1–1.0)
Monocytes Relative: 9 %
Neutro Abs: 2.9 10*3/uL (ref 1.7–7.7)
Neutrophils Relative %: 48 %
Platelets: 236 10*3/uL (ref 150–400)
RBC: 3.28 MIL/uL — ABNORMAL LOW (ref 3.87–5.11)
RDW: 15.4 % (ref 11.5–15.5)
WBC: 6 10*3/uL (ref 4.0–10.5)
nRBC: 0 % (ref 0.0–0.2)

## 2022-04-03 LAB — PHOSPHORUS: Phosphorus: 3.8 mg/dL (ref 2.5–4.6)

## 2022-04-03 MED ORDER — INSULIN ASPART 100 UNIT/ML IJ SOLN
0.0000 [IU] | INTRAMUSCULAR | Status: DC
  Administered 2022-04-03: 8 [IU] via SUBCUTANEOUS
  Administered 2022-04-03: 5 [IU] via SUBCUTANEOUS
  Administered 2022-04-04: 3 [IU] via SUBCUTANEOUS
  Administered 2022-04-04: 2 [IU] via SUBCUTANEOUS
  Administered 2022-04-04: 5 [IU] via SUBCUTANEOUS
  Administered 2022-04-04 (×2): 2 [IU] via SUBCUTANEOUS
  Administered 2022-04-04: 3 [IU] via SUBCUTANEOUS
  Administered 2022-04-05: 5 [IU] via SUBCUTANEOUS
  Administered 2022-04-05: 3 [IU] via SUBCUTANEOUS
  Administered 2022-04-05 (×2): 5 [IU] via SUBCUTANEOUS
  Administered 2022-04-05: 2 [IU] via SUBCUTANEOUS
  Administered 2022-04-05: 8 [IU] via SUBCUTANEOUS
  Administered 2022-04-06 (×2): 5 [IU] via SUBCUTANEOUS
  Administered 2022-04-06: 3 [IU] via SUBCUTANEOUS
  Administered 2022-04-06 – 2022-04-07 (×4): 5 [IU] via SUBCUTANEOUS
  Administered 2022-04-07: 8 [IU] via SUBCUTANEOUS
  Administered 2022-04-07: 5 [IU] via SUBCUTANEOUS

## 2022-04-03 MED ORDER — ACETAMINOPHEN 325 MG PO TABS
650.0000 mg | ORAL_TABLET | Freq: Three times a day (TID) | ORAL | Status: DC
  Administered 2022-04-03 – 2022-04-08 (×15): 650 mg via ORAL
  Filled 2022-04-03 (×15): qty 2

## 2022-04-03 MED ORDER — FREE WATER
150.0000 mL | Status: DC
  Administered 2022-04-03 – 2022-04-08 (×31): 150 mL

## 2022-04-03 MED ORDER — FLUCONAZOLE 100 MG PO TABS
200.0000 mg | ORAL_TABLET | Freq: Every day | ORAL | Status: DC
  Administered 2022-04-03 – 2022-04-08 (×6): 200 mg
  Filled 2022-04-03 (×6): qty 2

## 2022-04-03 MED ORDER — GLUCERNA 1.2 CAL PO LIQD
1000.0000 mL | ORAL | Status: DC
  Administered 2022-04-03 – 2022-04-07 (×3): 1000 mL
  Filled 2022-04-03 (×10): qty 1000

## 2022-04-03 NOTE — Progress Notes (Signed)
Physical Therapy Treatment Patient Details Name: [name] MRN: [id number] DOB: 1961/08/10 Today's Date: 04/03/2022   History of Present Illness 61 yo female presents [facility] from [facility] on 8/2 for RLE wound infection, sacral wound, AKI. s/p cortrak 8/7, s/p R AKA 8/9. PMH includes bilat BKA, COPD, OSA, T2DM, depression, HLD, HTN, anemia, CKD, CHF, TIA, GERD, gastroparesis, bradycardia s/p pacemaker, and anxiety.    PT Comments    Pt received in supine, agreeable to therapy session with encouragement but limited due to lethargy and c/o pain. Pt found to be incontinent of bowels during bed mobility and rolled ~5 reps to L and R sides during peri-care and RN present in room during session for dressing change as pt needing +2 physical assist due to pain/impulsivity with bed mobility. Unsafe to attempt EOB/OOB mobility today 2/2 lethargy and AMS, reviewed pressure relief strategies and UE/LE HEP, pt will need reinforcement. Pt continues to benefit from PT services to progress toward functional mobility goals.   Recommendations for follow up therapy are one component of a multi-disciplinary discharge planning process, led by the attending physician.  Recommendations may be updated based on patient status, additional functional criteria and insurance authorization.  Follow Up Recommendations  Skilled nursing-short term rehab (<3 hours/day) Can patient physically be transported by private vehicle: No   Assistance Recommended at Discharge Frequent or constant Supervision/Assistance  Patient can return home with the following Two people to help with walking and/or transfers;Two people to help with bathing/dressing/bathroom;Help with stairs or ramp for entrance   Equipment Recommendations  None recommended by PT (TBD post-acute)    Recommendations for Other Services       Precautions / Restrictions Precautions Precautions: Fall Precaution Comments: R AKA, L BKA, sacral wound and  on airbed Restrictions Weight Bearing Restrictions: Yes RLE Weight Bearing: Non weight bearing Other Position/Activity Restrictions:  (per op note)     Mobility  Bed Mobility Overal bed mobility: Needs Assistance Bed Mobility: Rolling Rolling: +2 for physical assistance, Mod assist, Min assist   Supine to sit: Max assist, +2 for physical assistance (to long sitting)     General bed mobility comments: max assist +1-2 for pull to/from long sit with use of bed rails for trunk elevation/lowering. Mod +2 for rolling during peri-care and dressing change (RN present to perform dressing change and replacing flexi-seal.    Transfers                   General transfer comment: defer due to pt lethargy    Ambulation/Gait                   Stairs             Wheelchair Mobility    Modified Rankin (Stroke Patients Only)       Balance Overall balance assessment: Needs assistance Sitting-balance support: Bilateral upper extremity supported Sitting balance-[name]: Poor Sitting balance - Comments: poor tolerance, <30 seconds in long sit while readjusting trunk                                    Cognition Arousal/Alertness: Lethargic Behavior During Therapy: Flat affect Overall Cognitive Status: Impaired/Different from baseline Area of Impairment: Orientation, Attention, Memory, Following commands, Awareness, Problem solving, Safety/judgement                 Orientation Level: Disoriented to, Place, Time,  Situation Current Attention Level: Focused   Following Commands: Follows one step commands inconsistently Safety/Judgement: Decreased awareness of safety, Decreased awareness of deficits (pulling at lines) Awareness: Intellectual Problem Solving: Slow processing, Decreased initiation, Difficulty sequencing, Requires verbal cues, Requires tactile cues General Comments: Pt with periods of irritability during mobility, lethargic  mostly, following some multimodal cues but needs mostly hand over hand assist to initiate and perform bed mobility. Defer EOB/OOB due to pt lethargy and significant time spent assisting with bed mobility during peri-care and RN changing wound dressing/flexi-seal.        Exercises Other Exercises Other Exercises: supine LLE AAROM: knee flexion/extension, hip flexion/extension x10 reps;  hip abduction (in sidelying x5 reps) Other Exercises: supine BUE AAROM: elbow and shoulder flexion/extension x5 reps (ROM limited due to pt AMS/in pain-free range)    General Comments General comments (skin integrity, edema, etc.): pt with bowel incontinence and sacral dressing discharge/soiled, RN notified and called to room to assist with repositioning and bed linen/dressing change, RN replaced flexi-seal to protect integrity of sacral wound while PTA assisting with pt positioning due to pt cognitive deficit/impulsivity.      Pertinent Vitals/Pain Pain Assessment Pain Assessment: Faces Faces Pain Scale: Hurts even more Pain Location: R residual limb, sacral wound Pain Descriptors / Indicators: Discomfort, Guarding, Grimacing, Moaning Pain Intervention(s): Limited activity within patient's tolerance, Monitored during session, Repositioned, Other (comment) (RN in room to change dressing during session, pillow under L hip post-repositioning for pressure relief)     PT Goals (current goals can now be found in the care plan section) Acute Rehab PT Goals Patient Stated Goal: less pain PT Goal Formulation: With patient Time For Goal Achievement: 04/13/22 Progress towards PT goals: Progressing toward goals    Frequency    Min 3X/week      PT Plan Current plan remains appropriate       AM-PAC PT "6 Clicks" Mobility   Outcome Measure  Help needed turning from your back to your side while in a flat bed without using bedrails?: A Lot Help needed moving from lying on your back to sitting on the side  of a flat bed without using bedrails?: Total Help needed moving to and from a bed to a chair (including a wheelchair)?: Total Help needed standing up from a chair using your arms (e.g., wheelchair or bedside chair)?: Total Help needed to walk in hospital room?: Total Help needed climbing 3-5 steps with a railing? : Total 6 Click Score: 7    End of Session   Activity Tolerance: Patient limited by lethargy;Patient limited by pain Patient left: in bed;with call bell/phone within reach;with bed alarm set;Other (comment) (pillow to offload L hip) Nurse Communication: Mobility status;Precautions;Other (comment) (pt needs HOB >30* with cortrak running) PT Visit Diagnosis: Muscle weakness (generalized) (M62.81);Pain Pain - part of body:  (RLE and sacral wound)     Time: 8937-3428 PT Time Calculation (min) (ACUTE ONLY): 29 min  Charges:  $Therapeutic Exercise: 8-22 mins $Therapeutic Activity: 8-22 mins                     Chason Mciver P., PTA Acute Rehabilitation Services Secure Chat Preferred 9a-5:30pm Office: [phone]    Dorathy Kinsman [facility] 04/03/2022, 6:19 PM

## 2022-04-03 NOTE — Plan of Care (Signed)
  Problem: Education: Goal: Knowledge of General Education information will improve Description: Including pain rating scale, medication(s)/side effects and non-pharmacologic comfort measures Outcome: Progressing   Problem: Clinical Measurements: Goal: Will remain free from infection 04/03/2022 1936 by Shella Maxim, RN Outcome: Progressing 04/03/2022 1935 by Shella Maxim, RN Outcome: Progressing Goal: Diagnostic test results will improve Outcome: Progressing   Problem: Activity: Goal: Risk for activity intolerance will decrease Outcome: Progressing   Problem: Nutrition: Goal: Adequate nutrition will be maintained Outcome: Progressing

## 2022-04-03 NOTE — Progress Notes (Signed)
HD#12 Subjective:   Summary:  [name] is a 61 y.o. with a pertinent PMH of HFpEF, COPD, GERD, HLD, HTN, hypothyroidism, OSA on CPAP, PVD, sinus bradycardia s/p pacemaker, and insulin-dependent type 2 diabetes who presented with hypotension and admitted for osteomyelitis and malnutrition.  Overnight Events: none  [name] is more awake and reporting pain from sacral wound that is unchanged. Alert and oriented x 2 to person and place. She denies any other concerns.    Objective:  Vital signs in last 24 hours: Vitals:   04/02/22 1800 04/02/22 1900 04/03/22 0338 04/03/22 0443  BP: (!) 156/63  (!) 135/58   Pulse: 72  68   Resp: 13  12   Temp:  98.7 F (37.1 C) 98.6 F (37 C)   TempSrc:  Axillary Oral   SpO2: 99%  99%   Weight:    101 kg  Height:       Supplemental O2: Room Air SpO2: 99 %  Physical Exam:  Constitutional: ill appearing, in no acute distress HENT: normocephalic atraumatic, NG tube in place Neck: supple Cardiovascular: regular rate and rhythm, no m/r/g Pulmonary/Chest: normal work of breathing on room air MSK: Left BKA, Right AKA Neurological: alert & oriented to self and place Skin: warm and dry Psych: normal mood  Filed Weights   03/31/22 0433 04/02/22 0441 04/03/22 0443  Weight: 100 kg 101 kg 101 kg     Intake/Output Summary (Last 24 hours) at 04/03/2022 0651 Last data filed at 04/03/2022 2202 Gross per 24 hour  Intake 1981.03 ml  Output 2610 ml  Net -628.97 ml    Net IO Since Admission: -8,939.01 mL [04/03/22 0651]  Pertinent Labs:    Latest Ref Rng & Units 04/02/2022    5:14 AM 04/01/2022    3:25 AM 03/31/2022    3:29 AM  CBC  WBC 4.0 - 10.5 K/uL 8.1  11.4  10.0   Hemoglobin 12.0 - 15.0 g/dL 9.5  54.2  70.6   Hematocrit 36.0 - 46.0 % 29.5  30.7  33.6   Platelets 150 - 400 K/uL 216  198  195        Latest Ref Rng & Units 04/02/2022    7:46 AM 04/02/2022    5:14 AM 04/02/2022   12:17 AM  CMP  Glucose 70 - 99 mg/dL 95     BUN 6  - 20 mg/dL 72     Creatinine 2.37 - 1.00 mg/dL 6.28     Sodium 315 - 176 mmol/L 134     Potassium 3.5 - 5.1 mmol/L 4.4  4.2  4.7   Chloride 98 - 111 mmol/L 99     CO2 22 - 32 mmol/L 26     Calcium 8.9 - 10.3 mg/dL 9.5      Assessment/Plan:   Principal Problem:   AKI (acute kidney injury) (HCC) Active Problems:   Uncontrolled type 2 diabetes mellitus with hyperglycemia (HCC)   Dehiscence of amputation stump of right lower extremity (HCC)   Sacral wound   Severe protein-calorie malnutrition (HCC)  Patient Summary: [name] is a 61 y.o. with a pertinent PMH of HFpEF, COPD, GERD, HLD, HTN, hypothyroidism, OSA on CPAP, PVD, sinus bradycardia s/p pacemaker, and insulin-dependent type 2 diabetes who presented with hypotension and admitted for osteomyelitis and malnutrition.   Acute on chronic kidney injury 3B Elevated BUN Hyperkalemia Potassium improved to 3.8 today. Discussing with RD and RN about low K feeding treatments. AKI likely pre-renal from  dehydration, will continue with feeding tube and free water. Creatinine improved 2.23 from 2.44. Consider renal ultrasound if worsening kidney function.  -repeat BMP  Encephalopathy Likely multi-factorial with history of severe malnutrition, chronic wounds, RLE osteomyelitis s/p amputation, and worsening renal function. Goals of care discussion as per above, patient alert and oriented to only self today. Will continue full scope of care measures aside from DNR status. -continue to treat reversible factors.    Right LE Osteomyelitis s/p Right AKA P/O day 5. No acute complications post operatively, wound vacc in place. Continues to be encephalopathic from multiple etiologies.  -continue wound care   Severe malnutrition  Cortrak in place. RD recommends transition back to Glucerna 1.2 at 65 ml/hr.  -continue tube feeds with Glucerna    Insulin-dependent type 2 diabetes mellitus Episodic Hypoglycemia secondary to malnutrition Continue CBG  every 4 hours to monitor for hypoglycemia. She is on semglee 20 units.  -continue semglee 20 units daily -start novolog every 4 hours  Hypotension Improved with management of osteomyelitis and malnutrition.   Chronic sacral wound Wound care management, continue to try and keep it clean with pressure offloading. Flexiseal to keep stool out of wound -Continue wound care -Continue home Tylenol and oxycodone pain regimen   GERD Continue Protonix 40 mg daily.  Thrush Patient has a few white plaques along hard palate. Was on nystatin but concern about esophageal coverage. Will start on diflucan, pharmacy recommend reduced dose given creatinine clearance.  -start diflucan 200 mg daily for 14 days -stop nystatin  Advance Care Planning Family meeting 04/01/22, patient transitioned to DNR. Plan to readdress goals of care after 7 days of treatment. If no improvement in mentation, patient will consider comfort care measures. They do not believe Tomika would want any artificial treatment. Continue full scope of care.  -currently DNR -re-evaluate in one week, continue full measures aside from DNR status.   Diet:  holding feeds IVF: LR,75cc/hr 8 hrs VTE: Enoxaparin Code: DNR  Dispo: Anticipated discharge pending further management of acute problems above and ongoing family discussions  Rana Snare, DO Internal Medicine Resident PGY-1 Please contact the on call pager after 5 pm and on weekends at [phone].

## 2022-04-03 NOTE — Progress Notes (Signed)
Daily Progress Note   Patient Name: [name]       Date: 04/03/2022 DOB: 1961-03-05  Age: 61 y.o. 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 [name] has been ordered. Ortho has consulted for right BKA repair. Per note patient has shared she did want to have additional surgery to stump.   Subjective: Chart reviewed including labs progress notes.  Per discussion with palliative plan is to continue current treatments for approximately a week and then reconsider goals of care. I evaluated [name] today she appeared to be a little more awake and alert.  She complained of pain in her buttocks.  She continues to be very lethargic.  She was unable to engage in goals of care discussion. No family at bedside.   Review of Systems  Unable to perform ROS: Mental status change     Physical Exam Vitals and nursing note reviewed.  Constitutional:      Appearance: She is ill-appearing.  HENT:     Mouth/Throat:     Mouth: Mucous membranes are dry.     Comments: White coating on tongue Cardiovascular:     Rate and Rhythm: Normal rate.  Pulmonary:     Effort: Pulmonary effort is normal.  Musculoskeletal:     Comments: S/p recent R AKA and remote healed L aka  Skin:    General: Skin is warm and dry.      Coloration: Skin is pale.  Neurological:     Comments: lethargic             Vital Signs: BP 132/66 (BP Location: Left Arm)   Pulse 82   Temp 98.6 F (37 C) (Oral)   Resp 13   Ht 5\' 5"  (1.651 m)   Wt 101 kg   SpO2 97%   BMI 37.05 kg/m  SpO2: SpO2: 97 % O2 Device: O2 Device: Room  Air O2 Flow Rate:    Intake/output summary:  Intake/Output Summary (Last 24 hours) at 04/03/2022 1250 Last data filed at 04/03/2022 1540 Gross per 24 hour  Intake 931.03 ml  Output 1960 ml  Net -1028.97 ml    LBM: Last BM Date : 04/03/22 Baseline Weight: Weight: 85.5 kg Most recent weight: Weight: 101 kg       Palliative Assessment/Data: PPS: 10%      Patient Active Problem List   Diagnosis Date Noted   Pressure injury of skin 04/03/2022   Dehiscence of amputation stump of right lower extremity (HCC)    Sacral wound    Severe protein-calorie malnutrition (HCC)    Cellulitis of sacral region 02/05/2022   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   Cystitis 10/16/2018   History of left below knee amputation (HCC) 07/16/2017   Abnormal urinalysis 06/18/2017   Diabetic gastroparesis (HCC) 06/18/2017   History of TIA (transient ischemic attack) 06/18/2017   Hyperlipidemia 04/10/2017   Acquired absence of right leg below knee (HCC) 11/29/2016   Left facial numbness    Cerebrovascular disease    Diabetic polyneuropathy associated with type 2 diabetes mellitus (HCC)    Chronic nonintractable headache    GERD (gastroesophageal reflux disease) 07/21/2016   TIA (transient ischemic attack) 07/04/2016   Hyperkalemia 12/07/2015   Cellulitis in diabetic foot (HCC) 12/05/2015   Herpes simplex labialis    Altered mental status    Thrush 10/20/2015   HSV-1 (herpes simplex virus 1) infection    Hallucinations    Acute respiratory failure with hypoxia (HCC) 10/14/2015   Hyperglycemia 10/13/2015   Diabetes mellitus due to underlying condition with hyperosmolarity without  nonketotic hyperglycemic-hyperosmolar coma [facility]) (HCC) 10/13/2015   Stage 3b chronic kidney disease (CKD) (HCC) 10/13/2015   AKI (acute kidney injury) (HCC) 10/13/2015   Diastolic CHF (HCC) 10/13/2015   Acute encephalopathy 10/13/2015   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   Lactic acid acidosis    Diabetic foot ulcer (HCC) 12/08/2014   Symptomatic bradycardia 03/04/2014   OSA on CPAP with oxygen 03/04/2014   COPD (chronic obstructive pulmonary disease), on home O2 03/04/2014   Bradycardia 03/04/2014   Cellulitis 08/16/2013   Chronic diastolic heart failure (HCC) 08/16/2013   Sepsis (HCC) 08/16/2013   SOB (shortness of breath) 04/20/2013   Proteinuria 01/26/2013   Acute on chronic kidney failure (HCC) 01/24/2013   Abscess of groin, right 01/23/2013   HTN (hypertension) 01/23/2013   Leukocytosis 01/23/2013   Anemia of chronic disease 01/23/2013   Hypothyroidism 01/23/2013   Hyponatremia 10/06/2011   HYPERCHOLESTEROLEMIA 08/17/2010   Depression 02/01/2010   TOBACCO ABUSE 06/04/2009   Uncontrolled type 2 diabetes mellitus with hyperglycemia (HCC) 08/06/2007    Palliative Care Assessment & Plan    Assessment/Recommendations/Plan  Continue current plan of care Vaseline to lips 4 times daily Tylenol 650 3 times daily p.o. for pain related to her sacral wound PMT will continue to follow    Code Status: DNR  Prognosis:  Unable to determine  Discharge Planning: To Be Determined   Thank you for allowing the Palliative Medicine Team to assist in the care of this patient.   Greater than 50%  of this time was spent counseling and coordinating care related to the above assessment and plan.  Ocie Bob, AGNP-C Palliative Medicine   Please contact Palliative Medicine Team phone at [phone] for questions and concerns.

## 2022-04-03 NOTE — Progress Notes (Signed)
Nutrition Follow-up  DOCUMENTATION CODES:   Not applicable  INTERVENTION:   Tube feeds via Cortrak: Transition to Glucerna 1.2 at 65 mL/hr (1560 mL/day) 60 mL ProSource TF 20 - daily 150 mL free water flush q4h Provides 1950 kcal, 114 gm of protein, and 2156 mL total free water daily. Continue Multivitamin w/ minerals daily Continue Juven BID, each packet provides 95 calories, 2.5 grams of protein to support wound healing Recommend discontinuing bowel regimen  NUTRITION DIAGNOSIS:   Increased nutrient needs related to wound healing as evidenced by estimated needs. - Ongoing   GOAL:   Patient will meet greater than or equal to 90% of their needs - Ongoing   MONITOR:   PO intake, Supplement acceptance, Labs, TF tolerance, I & O's  REASON FOR ASSESSMENT:   Malnutrition Screening Tool, Consult Assessment of nutrition requirement/status  ASSESSMENT:   Pt admitted with hypotension, found to have AKI. PMH significant for CHF, COPD, GERD, HLD, HTN, hypothyroidism, OSA on CPAP, PVD, sinus bradycardia s/p PPM, T2DM. Recent admission 6/18-6/27 for sacral wound requiring I&D.   8/07 - Cortrak placed (tip antrum or pyloric region) 8/09 - s/p R AKA 8/12 - rectal tube placed   Pt transitioned to Nepro on 8/13 per MD request due to hyperkalemia. Hyperkalemia has now resolved, despite BUN and creatinine continue to rise. Would recommend transitioning back to Glucerna tube feeds due to no clinical need for renal restriction at this time. Suspect rise in BUN and creatinine is secondary to dehydration. Pt continues to have good urine output. Reached out to MD to discuss.  Per RN, pt is not taking in any PO at this time.   Pt continues to have loose stools with rectal tube in place. Would recommend discontinuing bowel regimen to aid in bulking stools.   Discussed case with RN's.   Medications reviewed and include: Vitamin C, Colace, NovoLog SSI, Semglee, MVI, Protonix, Miralax, Senokot,  Zinc Labs reviewed: Sodium 133, BUN 82, Creatinine 2.23, 24 hr CBG 134-180  UOP: 2475 mL x 24 hours  Diet Order:   Diet Order             Diet Carb Modified Fluid consistency: Thin; Room service appropriate? Yes  Diet effective now                   EDUCATION NEEDS:   Not appropriate for education at this time  Skin:  Skin Assessment: Skin Integrity Issues: Unstageable: L buttocks Wound VAC: RLE  Last BM:  8/14 via rectal tube  Height:   Ht Readings from Last 1 Encounters:  03/29/22 5\' 5"  (1.651 m)    Weight:   Wt Readings from Last 1 Encounters:  04/03/22 101 kg    BMI:  Body mass index is 37.05 kg/m.  Estimated Nutritional Needs:   Kcal:  1800-2000  Protein:  90-105 grams  Fluid:  >/= 1.8 L    04/05/22 RD, LDN Clinical Dietitian See [facility] for contact information.

## 2022-04-03 NOTE — Plan of Care (Signed)
  Problem: Education: Goal: Knowledge of General Education information will improve Description: Including pain rating scale, medication(s)/side effects and non-pharmacologic comfort measures Outcome: Progressing   Problem: Clinical Measurements: Goal: Will remain free from infection Outcome: Progressing   Problem: Activity: Goal: Risk for activity intolerance will decrease Outcome: Progressing   

## 2022-04-04 DIAGNOSIS — E875 Hyperkalemia: Secondary | ICD-10-CM | POA: Diagnosis not present

## 2022-04-04 DIAGNOSIS — Z7189 Other specified counseling: Secondary | ICD-10-CM | POA: Diagnosis not present

## 2022-04-04 DIAGNOSIS — E43 Unspecified severe protein-calorie malnutrition: Secondary | ICD-10-CM | POA: Diagnosis not present

## 2022-04-04 DIAGNOSIS — T8781 Dehiscence of amputation stump: Secondary | ICD-10-CM | POA: Diagnosis not present

## 2022-04-04 DIAGNOSIS — N1832 Chronic kidney disease, stage 3b: Secondary | ICD-10-CM | POA: Diagnosis not present

## 2022-04-04 DIAGNOSIS — N179 Acute kidney failure, unspecified: Secondary | ICD-10-CM | POA: Diagnosis not present

## 2022-04-04 LAB — CBC
HCT: 29.2 % — ABNORMAL LOW (ref 36.0–46.0)
Hemoglobin: 9.8 g/dL — ABNORMAL LOW (ref 12.0–15.0)
MCH: 28.7 pg (ref 26.0–34.0)
MCHC: 33.6 g/dL (ref 30.0–36.0)
MCV: 85.6 fL (ref 80.0–100.0)
Platelets: 271 10*3/uL (ref 150–400)
RBC: 3.41 MIL/uL — ABNORMAL LOW (ref 3.87–5.11)
RDW: 15.2 % (ref 11.5–15.5)
WBC: 7.4 10*3/uL (ref 4.0–10.5)
nRBC: 0 % (ref 0.0–0.2)

## 2022-04-04 LAB — BASIC METABOLIC PANEL
Anion gap: 10 (ref 5–15)
BUN: 93 mg/dL — ABNORMAL HIGH (ref 6–20)
CO2: 27 mmol/L (ref 22–32)
Calcium: 10.1 mg/dL (ref 8.9–10.3)
Chloride: 100 mmol/L (ref 98–111)
Creatinine, Ser: 1.99 mg/dL — ABNORMAL HIGH (ref 0.44–1.00)
GFR, Estimated: 28 mL/min — ABNORMAL LOW (ref >60)
Glucose, Bld: 140 mg/dL — ABNORMAL HIGH (ref 70–99)
Potassium: 3.8 mmol/L (ref 3.5–5.1)
Sodium: 137 mmol/L (ref 135–145)

## 2022-04-04 LAB — GLUCOSE, CAPILLARY
Glucose-Capillary: 138 mg/dL — ABNORMAL HIGH (ref 70–99)
Glucose-Capillary: 131 mg/dL — ABNORMAL HIGH (ref 70–99)
Glucose-Capillary: 188 mg/dL — ABNORMAL HIGH (ref 70–99)
Glucose-Capillary: 198 mg/dL — ABNORMAL HIGH (ref 70–99)
Glucose-Capillary: 237 mg/dL — ABNORMAL HIGH (ref 70–99)

## 2022-04-04 MED ORDER — ENOXAPARIN SODIUM 60 MG/0.6ML IJ SOSY
50.0000 mg | PREFILLED_SYRINGE | INTRAMUSCULAR | Status: DC
  Administered 2022-04-04 – 2022-04-07 (×4): 50 mg via SUBCUTANEOUS
  Filled 2022-04-04 (×4): qty 0.6

## 2022-04-04 MED ORDER — OXYCODONE HCL 5 MG PO TABS
5.0000 mg | ORAL_TABLET | Freq: Four times a day (QID) | ORAL | Status: DC | PRN
  Administered 2022-04-06 – 2022-04-08 (×4): 5 mg via ORAL
  Filled 2022-04-04 (×4): qty 1

## 2022-04-04 NOTE — Progress Notes (Addendum)
HD#13 Subjective:   Summary:  [name] is a 61 y.o. with a pertinent PMH of HFpEF, COPD, GERD, HLD, HTN, hypothyroidism, OSA on CPAP, PVD, sinus bradycardia s/p pacemaker, and insulin-dependent type 2 diabetes who presented with hypotension and admitted for osteomyelitis and malnutrition.  Overnight Events: none  [name] is alert and oriented to self and place. States she is in [place]. Denies any throat pain.   Objective:  Vital signs in last 24 hours: Vitals:   04/04/22 0000 04/04/22 0430 04/04/22 0439 04/04/22 1200  BP: (!) 130/54 (!) 100/55  123/62  Pulse: 68   72  Resp: 13 18  13   Temp:  (!) 97.2 F (36.2 C)  97.8 F (36.6 C)  TempSrc:  Oral  Oral  SpO2: 99% 98%  95%  Weight:   101 kg   Height:       Supplemental O2: Room Air SpO2: 95 %  Physical Exam:  Constitutional: ill appearing, in no acute distress HENT: normocephalic atraumatic, NG tube in place Neck: supple Cardiovascular: regular rate and rhythm, no m/r/g Pulmonary/Chest: normal work of breathing on room air MSK: Left BKA, Right AKA Neurological: alert & oriented to self and place Skin: warm and dry Psych: normal mood  Filed Weights   04/02/22 0441 04/03/22 0443 04/04/22 0439  Weight: 101 kg 101 kg 101 kg     Intake/Output Summary (Last 24 hours) at 04/04/2022 1548 Last data filed at 04/04/2022 0500 Gross per 24 hour  Intake 1563.5 ml  Output 1445 ml  Net 118.5 ml   Net IO Since Admission: -9,355.51 mL [04/04/22 1548]  Pertinent Labs:    Latest Ref Rng & Units 04/04/2022    4:44 AM 04/03/2022    7:52 AM 04/02/2022    5:14 AM  CBC  WBC 4.0 - 10.5 K/uL 7.4  6.0  8.1   Hemoglobin 12.0 - 15.0 g/dL 9.8  9.2  9.5   Hematocrit 36.0 - 46.0 % 29.2  28.4  29.5   Platelets 150 - 400 K/uL 271  236  216        Latest Ref Rng & Units 04/04/2022    4:44 AM 04/03/2022    7:52 AM 04/02/2022    7:46 AM  CMP  Glucose 70 - 99 mg/dL 04/04/2022  568  95   BUN 6 - 20 mg/dL 93  82  72   Creatinine 0.44  - 1.00 mg/dL 127  5.17  0.01   Sodium 135 - 145 mmol/L 137  133  134   Potassium 3.5 - 5.1 mmol/L 3.8  3.8  4.4   Chloride 98 - 111 mmol/L 100  96  99   CO2 22 - 32 mmol/L 27  26  26    Calcium 8.9 - 10.3 mg/dL 7.49  9.6  9.5    Assessment/Plan:   Principal Problem:   AKI (acute kidney injury) (HCC) Active Problems:   Uncontrolled type 2 diabetes mellitus with hyperglycemia (HCC)   Dehiscence of amputation stump of right lower extremity (HCC)   Sacral wound   Severe protein-calorie malnutrition (HCC)   Pressure injury of skin  Patient Summary: [name] is a 61 y.o. with a pertinent PMH of HFpEF, COPD, GERD, HLD, HTN, hypothyroidism, OSA on CPAP, PVD, sinus bradycardia s/p pacemaker, and insulin-dependent type 2 diabetes who presented with hypotension and admitted for osteomyelitis and malnutrition.   Acute on chronic kidney injury 3B Elevated BUN Hyperkalemia Potassium steady 3.8. AKI likely pre-renal from dehydration.  Continue feeding tube with free water. Creatinine improved 1.99. If worsening renal function, consider ultrasound. -BMP  Encephalopathy Likely multi-factorial with history of severe malnutrition, chronic wounds, RLE osteomyelitis s/p amputation, and kidney injury. Goals of care discussion as per below. Will continue full scope of care measures aside from DNR status. -continue to treat reversible factors.    Right LE Osteomyelitis s/p Right AKA P/O day 6. No acute complications post operatively, wound vacc in place. Continues to be encephalopathic from multiple etiologies.  -continue wound care   Severe malnutrition  Cortrak in place. RD transition patient back to Glucerna 1.2 at 65 ml/hr. She is on semglee 20 units daily and novolog every 4 hours.  -continue tube feeds with Glucerna    Insulin-dependent type 2 diabetes mellitus Episodic Hypoglycemia secondary to malnutrition No hypoglycemia since starting tube feeds. CBG overnight in 130s.  -continue CBG  every 4 hours -continue semglee 20 units daily -continue novolog SSI-M every 4 hours  Hypotension Improved with management of osteomyelitis and malnutrition.   Chronic sacral wound Wound care management, continue to try and keep it clean with pressure offloading. Flexiseal to keep stool out of wound -Continue wound care -Continue home Tylenol and oxycodone pain regimen   GERD Continue Protonix 40 mg daily.  Thrush Patient has a few white plaques along hard palate. Was on nystatin but concern about esophageal coverage. Continue renally dosed diflucan. -continue diflucan 200 mg, day 2/14  Advance Care Planning Family meeting 04/01/22, patient transitioned to DNR. Plan to readdress goals of care after 7 days of treatment. If no improvement in mentation, patient will consider comfort care measures. They do not believe Emma[place]Lee would want any artificial treatment. Continue full scope of care.  -currently DNR -will re-evaluate at end of this week  -continue full measures aside from DNR status.   Diet: Tube Feeds (Glucerna 65 ml/hr) IVF: None,None VTE: Enoxaparin Code: DNR PT/OT recs: SNF  Dispo: Anticipated discharge pending further management of acute problems above and ongoing family discussions.   Rana Snare, DO Internal Medicine Resident PGY-1 Please contact the on call pager after 5 pm and on weekends at [phone].

## 2022-04-04 NOTE — Progress Notes (Signed)
Daily Progress Note   Patient Name: [name]       Date: 04/04/2022 DOB: 18-Jun-1961  Age: 61 y.o. 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 [name] has been ordered. Ortho has consulted for right BKA repair. Per note patient has shared she did want to have additional surgery to stump.   Subjective: Chart reviewed including labs progress notes.  [name] is much more alert today. She is not oriented- tells me she is at the [facility]. Does not know why, not oriented to anything about her medical situation. She was able to self feed with OT supervision today. Her meals intake has not been documented since 8/11.  She does complain of pain in her back. She states she has had pain in her back for a long time. Her lips and mouth look a bit better. Called her brother, [name], left message requesting return call.    Review of Systems  Unable to perform ROS: Mental status change     Physical Exam Vitals and nursing note reviewed.  Constitutional:      Appearance: She is ill-appearing.  Cardiovascular:     Rate and Rhythm: Normal rate.  Pulmonary:     Effort: Pulmonary effort is normal.  Musculoskeletal:     Comments: S/p recent R AKA and remote healed L aka  Skin:    General:  Skin is warm and dry.     Coloration: Skin is pale.  Neurological:     Mental Status: She is disoriented.             Vital Signs: BP 123/62 (BP Location: Left Arm)   Pulse 72   Temp 97.8 F (36.6 C) (Oral)   Resp 13   Ht 5\' 5"  (1.651 m)   Wt 101 kg   SpO2 95%   BMI 37.05 kg/m  SpO2: SpO2: 95 %  O2 Device: O2 Device: Room Air O2 Flow Rate:    Intake/output summary:  Intake/Output Summary (Last 24 hours) at 04/04/2022 1356 Last data filed at 04/04/2022 0500 Gross per 24 hour  Intake 1563.5 ml  Output 1445 ml  Net 118.5 ml    LBM: Last BM Date : 04/03/22 Baseline Weight: Weight: 85.5 kg Most recent weight: Weight: 101 kg       Palliative Assessment/Data: PPS: 20%      Patient Active Problem List   Diagnosis Date Noted   Pressure injury of skin 04/03/2022   Dehiscence of amputation stump of right lower extremity (HCC)    Sacral wound    Severe protein-calorie malnutrition (HCC)    Cellulitis of sacral region 02/05/2022   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   Cystitis 10/16/2018   History of left below knee amputation (HCC) 07/16/2017   Abnormal urinalysis 06/18/2017   Diabetic gastroparesis (HCC) 06/18/2017   History of TIA (transient ischemic attack) 06/18/2017   Hyperlipidemia 04/10/2017   Acquired absence of right leg below knee (HCC) 11/29/2016   Left facial numbness    Cerebrovascular disease    Diabetic polyneuropathy associated with type 2 diabetes mellitus (HCC)    Chronic nonintractable headache    GERD (gastroesophageal reflux disease) 07/21/2016   TIA (transient ischemic attack) 07/04/2016   Hyperkalemia 12/07/2015   Cellulitis in diabetic foot (HCC) 12/05/2015   Herpes simplex labialis    Altered mental status    Thrush 10/20/2015   HSV-1 (herpes simplex virus 1) infection    Hallucinations    Acute respiratory failure with hypoxia (HCC) 10/14/2015   Hyperglycemia 10/13/2015   Diabetes mellitus due to underlying  condition with hyperosmolarity without nonketotic hyperglycemic-hyperosmolar coma [facility]) (HCC) 10/13/2015   Stage 3b chronic kidney disease (CKD) (HCC) 10/13/2015   AKI (acute kidney injury) (HCC) 10/13/2015   Diastolic CHF (HCC) 10/13/2015   Acute encephalopathy 10/13/2015   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   Lactic acid acidosis    Diabetic foot ulcer (HCC) 12/08/2014   Symptomatic bradycardia 03/04/2014   OSA on CPAP with oxygen 03/04/2014   COPD (chronic obstructive pulmonary disease), on home O2 03/04/2014   Bradycardia 03/04/2014   Cellulitis 08/16/2013   Chronic diastolic heart failure (HCC) 08/16/2013   Sepsis (HCC) 08/16/2013   SOB (shortness of breath) 04/20/2013   Proteinuria 01/26/2013   Acute on chronic kidney failure (HCC) 01/24/2013   Abscess of groin, right 01/23/2013   HTN (hypertension) 01/23/2013   Leukocytosis 01/23/2013   Anemia of chronic disease 01/23/2013   Hypothyroidism 01/23/2013   Hyponatremia 10/06/2011   HYPERCHOLESTEROLEMIA 08/17/2010   Depression 02/01/2010   TOBACCO ABUSE 06/04/2009   Uncontrolled type 2 diabetes mellitus with hyperglycemia (HCC) 08/06/2007    Palliative Care Assessment & Plan    Assessment/Recommendations/Plan  Continue current plan of care Needs meal intake documented- ordered strict I&O, also need to ensure assistance with each meals- needs meal set up and initiated- this will be important information to help determine if long term feeding tube would need to be considered Called brother [name]- left message Continue Vaseline to lips 4 times daily Tylenol 650 3 times daily p.o. for pain related to her sacral wound PMT will continue to follow    Code Status: DNR  Prognosis:  Unable to determine  Discharge Planning: To Be Determined   Thank you for allowing the Palliative Medicine Team to assist in the care of this patient.   Greater than 50%  of this  time was spent counseling and coordinating  care related to the above assessment and plan.  Mariana Kaufman, AGNP-C Palliative Medicine   Please contact Palliative Medicine Team phone at [phone] for questions and concerns.

## 2022-04-04 NOTE — Progress Notes (Signed)
Occupational Therapy Treatment Patient Details Name: [name] MRN: [id number] DOB: 27-Apr-1961 Today's Date: 04/04/2022   History of present illness 61 yo female presents to [facility] from [facility] on 8/2 for RLE wound infection, sacral wound, AKI. s/p cortrak 8/7, s/p R AKA 8/9. PMH includes bilat BKA, COPD, OSA, T2DM, depression, HLD, HTN, anemia, CKD, CHF, TIA, GERD, gastroparesis, bradycardia s/p pacemaker, and anxiety.   OT comments  Patient received in bed and appeared alert. Patient oriented to self only. Patient required max assist to position in bed and instructed on grooming with verbal cues to stay on task and mod assist for combing hair. Patient instructed on self feeding with cup of apple sauce and patient requiring min assist to initiate and able to manage cup and spoon with verbal cues. Patient to continue to be followed by Acute OT.    Recommendations for follow up therapy are one component of a multi-disciplinary discharge planning process, led by the attending physician.  Recommendations may be updated based on patient status, additional functional criteria and insurance authorization.    Follow Up Recommendations  Skilled nursing-short term rehab (<3 hours/day)    Assistance Recommended at Discharge Frequent or constant Supervision/Assistance  Patient can return home with the following  Two people to help with walking and/or transfers;A lot of help with bathing/dressing/bathroom;Assistance with feeding;Assistance with cooking/housework;Direct supervision/assist for medications management;Direct supervision/assist for financial management;Assist for transportation;Help with stairs or ramp for entrance   Equipment Recommendations  Other (comment) (TBD)    Recommendations for Other Services      Precautions / Restrictions Precautions Precautions: Fall Precaution Comments: R AKA, L BKA, sacral wound and on airbed Restrictions Weight Bearing Restrictions:  Yes RLE Weight Bearing: Non weight bearing       Mobility Bed Mobility Overal bed mobility: Needs Assistance Bed Mobility: Rolling Rolling: Max assist         General bed mobility comments: assisted patient with positioning in bed with max assist.    Transfers                   General transfer comment: defer due to pt lethargy     Balance                                           ADL either performed or assessed with clinical judgement   ADL Overall ADL's : Needs assistance/impaired Eating/Feeding: Minimal assistance;Bed level Eating/Feeding Details (indicate cue type and reason): min assist to begin self feeding and was able to feed self with patient holding cup of apple sauce and manageing utensil Grooming: Wash/dry face;Brushing hair;Bed level;Moderate assistance Grooming Details (indicate cue type and reason): able to wash face with multiple cues and mod assist to comb hair                               General ADL Comments: multiple cues to stay on task    Extremity/Trunk Assessment              Vision       Perception     Praxis      Cognition Arousal/Alertness: Lethargic (alert initially but became lethargic during session) Behavior During Therapy: Flat affect Overall Cognitive Status: Impaired/Different from baseline Area of Impairment: Orientation, Attention, Memory, Following commands, Awareness, Problem solving, Safety/judgement  Orientation Level: Disoriented to, Place, Time, Situation Current Attention Level: Focused   Following Commands: Follows one step commands inconsistently Safety/Judgement: Decreased awareness of safety, Decreased awareness of deficits Awareness: Intellectual Problem Solving: Slow processing, Decreased initiation, Difficulty sequencing, Requires verbal cues, Requires tactile cues General Comments: oriented to self only. Followed commands inconsistently         Exercises      Shoulder Instructions       General Comments      Pertinent Vitals/ Pain       Pain Assessment Pain Assessment: Faces Faces Pain Scale: Hurts little more Pain Location: R residual limb, sacral wound Pain Descriptors / Indicators: Discomfort, Guarding, Grimacing Pain Intervention(s): Monitored during session, Repositioned  Home Living                                          Prior Functioning/Environment              Frequency  Min 2X/week        Progress Toward Goals  OT Goals(current goals can now be found in the care plan section)  Progress towards OT goals: Progressing toward goals  Acute Rehab OT Goals OT Goal Formulation: Patient unable to participate in goal setting Time For Goal Achievement: 04/13/22 Potential to Achieve Goals: Fair ADL Goals Pt Will Perform Eating: with set-up;sitting;bed level Pt Will Perform Grooming: with min assist;sitting;bed level Pt Will Perform Upper Body Dressing: with min assist;sitting Pt Will Transfer to Toilet: with mod assist;with transfer board;anterior/posterior transfer;bedside commode Additional ADL Goal #1: Pt will perform bed mobility with min assist using bed rails in preparation for ADLs and for pressure relief. Additional ADL Goal #2: Pt will demonstrate sustained attention as a precursor to ADLs.  Plan Discharge plan remains appropriate    Co-evaluation                 AM-PAC OT "6 Clicks" Daily Activity     Outcome Measure   Help from another person eating meals?: A Lot Help from another person taking care of personal grooming?: A Lot Help from another person toileting, which includes using toliet, bedpan, or urinal?: Total Help from another person bathing (including washing, rinsing, drying)?: Total Help from another person to put on and taking off regular upper body clothing?: Total Help from another person to put on and taking off regular lower body  clothing?: Total 6 Click Score: 8    End of Session    OT Visit Diagnosis: Pain;Muscle weakness (generalized) (M62.81);Other symptoms and signs involving cognitive function   Activity Tolerance Patient limited by lethargy   Patient Left in bed;with call bell/phone within reach;with bed alarm set   Nurse Communication Other (comment) (self feeding)        Time: 1610-9604 OT Time Calculation (min): 24 min  Charges: OT General Charges $OT Visit: 1 Visit OT Treatments $Self Care/Home Management : 23-37 mins  Alfonse Flavors, OTA Acute Rehabilitation Services  Office [phone]   Dewain Penning 04/04/2022, 9:20 AM

## 2022-04-05 DIAGNOSIS — E43 Unspecified severe protein-calorie malnutrition: Secondary | ICD-10-CM | POA: Diagnosis not present

## 2022-04-05 DIAGNOSIS — N179 Acute kidney failure, unspecified: Secondary | ICD-10-CM | POA: Diagnosis not present

## 2022-04-05 DIAGNOSIS — N1832 Chronic kidney disease, stage 3b: Secondary | ICD-10-CM | POA: Diagnosis not present

## 2022-04-05 DIAGNOSIS — E1165 Type 2 diabetes mellitus with hyperglycemia: Secondary | ICD-10-CM | POA: Diagnosis not present

## 2022-04-05 DIAGNOSIS — M86611 Other chronic osteomyelitis, right shoulder: Secondary | ICD-10-CM

## 2022-04-05 LAB — CBC
HCT: 28.8 % — ABNORMAL LOW (ref 36.0–46.0)
Hemoglobin: 9.3 g/dL — ABNORMAL LOW (ref 12.0–15.0)
MCH: 28.2 pg (ref 26.0–34.0)
MCHC: 32.3 g/dL (ref 30.0–36.0)
MCV: 87.3 fL (ref 80.0–100.0)
Platelets: 313 10*3/uL (ref 150–400)
RBC: 3.3 MIL/uL — ABNORMAL LOW (ref 3.87–5.11)
RDW: 15 % (ref 11.5–15.5)
WBC: 7.2 10*3/uL (ref 4.0–10.5)
nRBC: 0 % (ref 0.0–0.2)

## 2022-04-05 LAB — BASIC METABOLIC PANEL
Anion gap: 12 (ref 5–15)
BUN: 105 mg/dL — ABNORMAL HIGH (ref 6–20)
CO2: 27 mmol/L (ref 22–32)
Calcium: 9.7 mg/dL (ref 8.9–10.3)
Chloride: 96 mmol/L — ABNORMAL LOW (ref 98–111)
Creatinine, Ser: 1.9 mg/dL — ABNORMAL HIGH (ref 0.44–1.00)
GFR, Estimated: 30 mL/min — ABNORMAL LOW (ref >60)
Glucose, Bld: 125 mg/dL — ABNORMAL HIGH (ref 70–99)
Potassium: 4.2 mmol/L (ref 3.5–5.1)
Sodium: 135 mmol/L (ref 135–145)

## 2022-04-05 LAB — GLUCOSE, CAPILLARY
Glucose-Capillary: 125 mg/dL — ABNORMAL HIGH (ref 70–99)
Glucose-Capillary: 173 mg/dL — ABNORMAL HIGH (ref 70–99)
Glucose-Capillary: 208 mg/dL — ABNORMAL HIGH (ref 70–99)
Glucose-Capillary: 213 mg/dL — ABNORMAL HIGH (ref 70–99)
Glucose-Capillary: 216 mg/dL — ABNORMAL HIGH (ref 70–99)
Glucose-Capillary: 262 mg/dL — ABNORMAL HIGH (ref 70–99)

## 2022-04-05 NOTE — Progress Notes (Signed)
Physical Therapy Treatment Patient Details Name: [name] MRN: [id number] DOB: [birth year] Today's Date: 04/05/2022   History of Present Illness 61 yo female presents to [facility] from [facility] on 8/2 for RLE wound infection, sacral wound, AKI. s/p cortrak 8/7, s/p R AKA 8/9. PMH includes bilat BKA, COPD, OSA, T2DM, depression, HLD, HTN, anemia, CKD, CHF, TIA, GERD, gastroparesis, bradycardia s/p pacemaker, and anxiety.    PT Comments    Pt received in supine, brother present and encouraging, pt lethargic with slow processing and oriented to self only. Pt needing up to modA for rolling to L/R sides and unable to pull herself to long sitting in bed with maxA and bed side rail support. Pt falling asleep between exercises but participatory in A/AAROM of UE and LE with increased stimuli and multimodal cues as detailed below. Reviewed pressure relief frequency/technique with pt and brother [name], pt will need reinforcement. Pt continues to benefit from PT services to progress toward functional mobility goals.   Recommendations for follow up therapy are one component of a multi-disciplinary discharge planning process, led by the attending physician.  Recommendations may be updated based on patient status, additional functional criteria and insurance authorization.  Follow Up Recommendations  Skilled nursing-short term rehab (<3 hours/day) Can patient physically be transported by private vehicle: No   Assistance Recommended at Discharge Frequent or constant Supervision/Assistance  Patient can return home with the following Two people to help with walking and/or transfers;Two people to help with bathing/dressing/bathroom;Help with stairs or ramp for entrance   Equipment Recommendations  None recommended by PT (TBD post-acute)    Recommendations for Other Services       Precautions / Restrictions Precautions Precautions: Fall Precaution Comments: R AKA (with wound vac), L BKA, sacral  wound, flexi-seal and on airbed Restrictions Weight Bearing Restrictions: Yes RLE Weight Bearing: Non weight bearing     Mobility  Bed Mobility Overal bed mobility: Needs Assistance Bed Mobility: Rolling Rolling: Mod assist   Supine to sit: HOB elevated     General bed mobility comments: assisted patient with positioning in bed with mod assist for rolling to R side for pressure relief; pt encouraged to attempt long sitting with B side rails to pull up on but only raising head a few inches off pillow when attempted and maxA given; lethargic.    Transfers       General transfer comment: defer due to pt lethargy despite repositioning       Balance Overall balance assessment: Needs assistance Sitting-balance support: Bilateral upper extremity supported Sitting balance-[name]: Zero Sitting balance - Comments: totalA for attempting to sit upright; unable to self-support in air bed                                    Cognition Arousal/Alertness: Lethargic (despite repositioning) Behavior During Therapy: Flat affect Overall Cognitive Status: Impaired/Different from baseline Area of Impairment: Orientation, Attention, Memory, Following commands, Awareness, Problem solving, Safety/judgement                 Orientation Level: Disoriented to, Place, Time, Situation Current Attention Level: Focused   Following Commands: Follows one step commands inconsistently Safety/Judgement: Decreased awareness of safety, Decreased awareness of deficits Awareness: Intellectual Problem Solving: Slow processing, Decreased initiation, Difficulty sequencing, Requires verbal cues, Requires tactile cues General Comments: oriented to self only, needs max multimodal cues for simple mobility tasks, slow processing but following  most cues with increased time and repetition; brother [name] present in room and encouraging to her but pt remains lethargic.        Exercises Amputee  Exercises Quad Sets: AAROM, Left Hip Extension: AAROM, Both, 10 reps, Supine (tactile cues consistently needed) Hip ABduction/ADduction: AROM, AAROM, Both, 10 reps, Supine (consistent tactile cues) Knee Flexion: AAROM, Left, 10 reps, AROM, Supine (tactile cues to continue) Knee Extension: AROM, Left, 10 reps, Supine (tactile cues) Straight Leg Raises: AAROM, AROM, Both, 10 reps, Supine (multimodal cues) Other Exercises Other Exercises: supine BUE AROM: BUE rowing each UE x10-15 reps ea with consistent tactile cues to initiate 2/2 lethargy, gentle resistance within tolerance    General Comments General comments (skin integrity, edema, etc.): BP 129/62 (80) taken during session; HR 70 bpm      Pertinent Vitals/Pain Pain Assessment Pain Assessment: Faces Faces Pain Scale: Hurts a little bit Pain Location: R residual limb, sacral wound Pain Descriptors / Indicators: Discomfort Pain Intervention(s): Monitored during session, Repositioned, Premedicated before session           PT Goals (current goals can now be found in the care plan section) Acute Rehab PT Goals Patient Stated Goal: less pain PT Goal Formulation: With patient Time For Goal Achievement: 04/13/22 Progress towards PT goals: Not progressing toward goals - comment (lethargy limiting)    Frequency    Min 3X/week      PT Plan Current plan remains appropriate       AM-PAC PT "6 Clicks" Mobility   Outcome Measure  Help needed turning from your back to your side while in a flat bed without using bedrails?: A Lot Help needed moving from lying on your back to sitting on the side of a flat bed without using bedrails?: Total Help needed moving to and from a bed to a chair (including a wheelchair)?: Total Help needed standing up from a chair using your arms (e.g., wheelchair or bedside chair)?: Total Help needed to walk in hospital room?: Total Help needed climbing 3-5 steps with a railing? : Total 6 Click Score:  7    End of Session   Activity Tolerance: Patient limited by lethargy Patient left: in bed;with call bell/phone within reach;with family/visitor present (bed alarm not registering due to pt amputee status so left off; semi-sidelying toward her R side) Nurse Communication: Mobility status;Other (comment) (R elbow dry skin; HOB >30* with cortrak) PT Visit Diagnosis: Muscle weakness (generalized) (M62.81);Pain     Time: 1442-1510 PT Time Calculation (min) (ACUTE ONLY): 28 min  Charges:  $Therapeutic Exercise: 8-22 mins $Therapeutic Activity: 8-22 mins                     Jerika Wales P., PTA Acute Rehabilitation Services Secure Chat Preferred 9a-5:30pm Office: [phone]    Dorathy Kinsman [facility] 04/05/2022, 4:24 PM

## 2022-04-05 NOTE — Progress Notes (Signed)
HD#14 Subjective:   Summary:  [name] is a 61 y.o. with a pertinent PMH of HFpEF, COPD, GERD, HLD, HTN, hypothyroidism, OSA on CPAP, PVD, sinus bradycardia s/p pacemaker, and insulin-dependent type 2 diabetes who presented with hypotension and admitted for osteomyelitis and malnutrition.  Overnight Events: none  [name] reports feeling better this morning. Oriented to person and place. She states she is at [facility]. She is having some po intake in addition to her tube feeding.   Objective:  Vital signs in last 24 hours: Vitals:   04/04/22 1700 04/04/22 2000 04/05/22 0000 04/05/22 0307  BP: 108/80 (!) 136/52 129/61 132/65  Pulse: 75 72 70 76  Resp: 15 12 17 17   Temp: 97.6 F (36.4 C) 97.8 F (36.6 C) 98 F (36.7 C) 98.1 F (36.7 C)  TempSrc: Oral Oral Oral Oral  SpO2:      Weight:      Height:       Supplemental O2: Room Air SpO2: 95 %  Physical Exam:  Constitutional: ill appearing, in no acute distress HENT: normocephalic atraumatic, NG tube in place  Neck: supple Cardiovascular: regular rate and rhythm, no m/r/g MSK: Left BKA, Right AKA Neurological: alert & oriented to self and place, not time Skin: warm and dry Psych: normal mood   Filed Weights   04/02/22 0441 04/03/22 0443 04/04/22 0439  Weight: 101 kg 101 kg 101 kg    No intake or output data in the 24 hours ending 04/05/22 0730  Net IO Since Admission: -9,355.51 mL [04/05/22 0730]  Pertinent Labs:    Latest Ref Rng & Units 04/05/2022    4:47 AM 04/04/2022    4:44 AM 04/03/2022    7:52 AM  CBC  WBC 4.0 - 10.5 K/uL 7.2  7.4  6.0   Hemoglobin 12.0 - 15.0 g/dL 9.3  9.8  9.2   Hematocrit 36.0 - 46.0 % 28.8  29.2  28.4   Platelets 150 - 400 K/uL 313  271  236        Latest Ref Rng & Units 04/05/2022    4:47 AM 04/04/2022    4:44 AM 04/03/2022    7:52 AM  CMP  Glucose 70 - 99 mg/dL 04/05/2022  416  606   BUN 6 - 20 mg/dL 301  93  82   Creatinine 0.44 - 1.00 mg/dL 601  0.93  2.35    Sodium 135 - 145 mmol/L 135  137  133   Potassium 3.5 - 5.1 mmol/L 4.2  3.8  3.8   Chloride 98 - 111 mmol/L 96  100  96   CO2 22 - 32 mmol/L 27  27  26    Calcium 8.9 - 10.3 mg/dL 9.7  5.73  9.6    Assessment/Plan:   Principal Problem:   AKI (acute kidney injury) (HCC) Active Problems:   Uncontrolled type 2 diabetes mellitus with hyperglycemia (HCC)   Dehiscence of amputation stump of right lower extremity (HCC)   Sacral wound   Severe protein-calorie malnutrition (HCC)   Pressure injury of skin  Patient Summary: [name] is a 61 y.o. with a pertinent PMH of HFpEF, COPD, GERD, HLD, HTN, hypothyroidism, OSA on CPAP, PVD, sinus bradycardia s/p pacemaker, and insulin-dependent type 2 diabetes who presented with hypotension and admitted for osteomyelitis and malnutrition.   Acute on chronic kidney injury 3B Elevated BUN Hyperkalemia Potassium 4.2. AKI likely pre-renal from dehydration. Continue tube feeding and free water 150 ml every  4 hours. Creatinine improving 1.90. BUN increasing to 105, likely from dehydration. No evidence of bleeding, Hgb has been stable. If worsening renal function, consider ultrasound. -BMP  Encephalopathy Likely multi-factorial with history of severe malnutrition, chronic wounds, RLE osteomyelitis s/p amputation, and kidney injury. Goals of care discussion as per below. Will continue full scope of care measures aside from DNR status. -continue to treat reversible factors -ongoing family discussions with palliative care  Right LE Osteomyelitis s/p Right AKA P/O day 7. No acute complications post operatively, wound vacc in place. Continues to be encephalopathic from multiple etiologies.  -continue wound care   Severe malnutrition  Cortrak in place. On Glucerna 1.2 at 65 ml/hr. She is on semglee 20 units daily and novolog every 4 hours. OT noted she is able to self feed.  -continue tube feeding -continue with po intake if tolerating     Insulin-dependent type 2 diabetes mellitus Episodic Hypoglycemia secondary to malnutrition No hypoglycemia since starting tube feeds. CBG overnight 208 to 125. -continue CBG every 4 hours -continue semglee 20 units daily -continue novolog SSI-M every 4 hours  Hypotension Improved with management of osteomyelitis and malnutrition.   Chronic sacral wound Wound care management, continue to try and keep it clean with pressure offloading. Flexiseal to keep stool out of wound. -Continue wound care -Continue home Tylenol and oxycodone pain regimen   GERD Continue Protonix 40 mg daily.  Thrush Patient has a few white plaques along hard palate. Was on nystatin but concern about esophageal coverage. Continue renally dosed diflucan. -continue diflucan 200 mg, day 3/14  Advance Care Planning Family meeting 04/01/22, patient transitioned to DNR. Plan to readdress goals of care after 7 days of treatment. If no improvement in mentation, patient will consider comfort care measures. They do not believe Jeniah would want any artificial treatment. Continue full scope of care.  -currently DNR -will re-evaluate at end of this week  -continue full measures aside from DNR status.   Diet: Tube Feeds (Glucerna 65 ml/hr) IVF: None,None VTE: Enoxaparin Code: DNR PT/OT recs: SNF  Dispo: Anticipated discharge pending further management of acute problems above and ongoing family discussions.   Rana Snare, DO Internal Medicine Resident PGY-1 Please contact the on call pager after 5 pm and on weekends at [phone].

## 2022-04-05 NOTE — TOC Progression Note (Signed)
Transition of Care [facility]) - Progression Note    Patient Details  Name: SHANNEN FLANSBURG MRN: [id number] Date of Birth: [birth year]  Transition of Care [facility]) CM/SW Contact  Mearl Latin, LCSW Phone Number: 04/05/2022, 9:16 AM  Clinical Narrative:    CSW continuing to follow. [facility] contacted CSW and CSW provided update.    Expected Discharge Plan: Skilled Nursing Facility Barriers to Discharge: Continued Medical Work up  Expected Discharge Plan and Services Expected Discharge Plan: Skilled Nursing Facility In-house Referral: Clinical Social Work   Post Acute Care Choice: Skilled Nursing Facility Living arrangements for the past 2 months: Skilled Nursing Facility                                       Social Determinants of Health (SDOH) Interventions    Readmission Risk Interventions    02/15/2022    9:01 AM 02/07/2022   12:17 PM  Readmission Risk Prevention Plan  Transportation Screening Complete Complete  PCP or Specialist Appt within 5-7 Days  Complete  PCP or Specialist Appt within 3-5 Days Complete   Home Care Screening  Complete  Medication Review (RN CM)  Complete  HRI or Home Care Consult Complete   Social Work Consult for Recovery Care Planning/Counseling Complete   Palliative Care Screening Not Applicable   Medication Review [employer]) Complete

## 2022-04-06 DIAGNOSIS — N1832 Chronic kidney disease, stage 3b: Secondary | ICD-10-CM | POA: Diagnosis not present

## 2022-04-06 DIAGNOSIS — E1165 Type 2 diabetes mellitus with hyperglycemia: Secondary | ICD-10-CM | POA: Diagnosis not present

## 2022-04-06 DIAGNOSIS — N179 Acute kidney failure, unspecified: Secondary | ICD-10-CM | POA: Diagnosis not present

## 2022-04-06 DIAGNOSIS — Z7189 Other specified counseling: Secondary | ICD-10-CM | POA: Diagnosis not present

## 2022-04-06 DIAGNOSIS — S31000D Unspecified open wound of lower back and pelvis without penetration into retroperitoneum, subsequent encounter: Secondary | ICD-10-CM | POA: Diagnosis not present

## 2022-04-06 DIAGNOSIS — E43 Unspecified severe protein-calorie malnutrition: Secondary | ICD-10-CM | POA: Diagnosis not present

## 2022-04-06 LAB — GLUCOSE, CAPILLARY
Glucose-Capillary: 200 mg/dL — ABNORMAL HIGH (ref 70–99)
Glucose-Capillary: 219 mg/dL — ABNORMAL HIGH (ref 70–99)
Glucose-Capillary: 225 mg/dL — ABNORMAL HIGH (ref 70–99)
Glucose-Capillary: 226 mg/dL — ABNORMAL HIGH (ref 70–99)
Glucose-Capillary: 229 mg/dL — ABNORMAL HIGH (ref 70–99)
Glucose-Capillary: 231 mg/dL — ABNORMAL HIGH (ref 70–99)

## 2022-04-06 LAB — CBC
HCT: 28.9 % — ABNORMAL LOW (ref 36.0–46.0)
Hemoglobin: 9.6 g/dL — ABNORMAL LOW (ref 12.0–15.0)
MCH: 28.2 pg (ref 26.0–34.0)
MCHC: 33.2 g/dL (ref 30.0–36.0)
MCV: 84.8 fL (ref 80.0–100.0)
Platelets: 345 10*3/uL (ref 150–400)
RBC: 3.41 MIL/uL — ABNORMAL LOW (ref 3.87–5.11)
RDW: 15 % (ref 11.5–15.5)
WBC: 8.8 10*3/uL (ref 4.0–10.5)
nRBC: 0 % (ref 0.0–0.2)

## 2022-04-06 LAB — BASIC METABOLIC PANEL
Anion gap: 13 (ref 5–15)
BUN: 107 mg/dL — ABNORMAL HIGH (ref 6–20)
CO2: 27 mmol/L (ref 22–32)
Calcium: 9.5 mg/dL (ref 8.9–10.3)
Chloride: 95 mmol/L — ABNORMAL LOW (ref 98–111)
Creatinine, Ser: 1.82 mg/dL — ABNORMAL HIGH (ref 0.44–1.00)
GFR, Estimated: 31 mL/min — ABNORMAL LOW (ref >60)
Glucose, Bld: 225 mg/dL — ABNORMAL HIGH (ref 70–99)
Potassium: 4.6 mmol/L (ref 3.5–5.1)
Sodium: 135 mmol/L (ref 135–145)

## 2022-04-06 MED ORDER — QUETIAPINE FUMARATE 25 MG PO TABS: 12.5000 mg | ORAL_TABLET | Freq: Once | ORAL | Status: DC

## 2022-04-06 MED ORDER — QUETIAPINE FUMARATE 25 MG PO TABS
12.5000 mg | ORAL_TABLET | Freq: Once | ORAL | Status: AC | PRN
  Administered 2022-04-06: 12.5 mg via ORAL
  Filled 2022-04-06: qty 1

## 2022-04-06 MED ORDER — INSULIN GLARGINE-YFGN 100 UNIT/ML ~~LOC~~ SOLN
24.0000 [IU] | Freq: Every day | SUBCUTANEOUS | Status: DC
  Administered 2022-04-07 – 2022-04-08 (×2): 24 [IU] via SUBCUTANEOUS
  Filled 2022-04-06 (×2): qty 0.24

## 2022-04-06 MED ORDER — PROSOURCE TF20 ENFIT COMPATIBL EN LIQD
60.0000 mL | Freq: Two times a day (BID) | ENTERAL | Status: DC
Start: 2022-04-06 — End: 2022-04-08
  Administered 2022-04-06 – 2022-04-08 (×4): 60 mL
  Filled 2022-04-06 (×4): qty 60

## 2022-04-06 NOTE — Progress Notes (Signed)
Daily Progress Note   Patient Name: [name]       Date: 04/06/2022 DOB: [birth year]  Age: 61 y.o. 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 [name] has been ordered. Ortho has consulted for right BKA repair. Per note patient has shared she did want to have additional surgery to stump.   Subjective: Chart reviewed including labs progress notes.  [name] is awake, but not oriented.  She continues to eat very poorly.  Spoke with both [name] and [name] (brother and sister). Updated on mental status, continued poor po intake.  Both confirm plan to come back together with Palliative team on Saturday for further GOC discussion.    Review of Systems  Unable to perform ROS: Mental status change     Physical Exam Vitals and nursing note reviewed.  Constitutional:      Appearance: She is ill-appearing.  Cardiovascular:     Rate and Rhythm: Normal rate.  Pulmonary:     Effort: Pulmonary effort is normal.  Musculoskeletal:     Comments: S/p recent R AKA and remote healed L aka  Skin:    General: Skin is warm and dry.     Coloration: Skin is pale.  Neurological:     Mental Status: She is disoriented.             Vital Signs: BP (!) 128/49 (BP Location: Right Arm)   Pulse 77   Temp 97.6 F  (36.4 C) (Oral)   Resp (!) 23   Ht 5\' 5"  (1.651 m)   Wt 101 kg   SpO2 96%   BMI 37.05 kg/m  SpO2: SpO2: 96 % O2 Device: O2 Device: Room Air O2 Flow Rate:    Intake/output summary:  Intake/Output Summary (Last 24 hours) at 04/06/2022 1054 Last data filed at 04/06/2022 0914 Gross per 24 hour  Intake --  Output 1495  ml  Net -1495 ml    LBM: Last BM Date : 04/05/22 Baseline Weight: Weight: 85.5 kg Most recent weight: Weight: 101 kg       Palliative Assessment/Data: PPS: 20%      Patient Active Problem List   Diagnosis Date Noted   Pressure injury of skin 04/03/2022   Dehiscence of amputation stump of right lower extremity (HCC)    Sacral wound    Severe protein-calorie malnutrition (HCC)    Cellulitis of sacral region 02/05/2022   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   Cystitis 10/16/2018   History of left below knee amputation (HCC) 07/16/2017   Abnormal urinalysis 06/18/2017   Diabetic gastroparesis (HCC) 06/18/2017   History of TIA (transient ischemic attack) 06/18/2017   Hyperlipidemia 04/10/2017   Acquired absence of right leg below knee (HCC) 11/29/2016   Left facial numbness    Cerebrovascular disease    Diabetic polyneuropathy associated with type 2 diabetes mellitus (HCC)    Chronic nonintractable headache    GERD (gastroesophageal reflux disease) 07/21/2016   TIA (transient ischemic attack) 07/04/2016   Hyperkalemia 12/07/2015   Cellulitis in diabetic foot (HCC) 12/05/2015   Herpes simplex labialis    Altered mental status    Thrush 10/20/2015   HSV-1 (herpes simplex virus 1) infection    Hallucinations    Acute respiratory failure with hypoxia (HCC) 10/14/2015   Hyperglycemia 10/13/2015   Diabetes mellitus due to underlying condition with hyperosmolarity without nonketotic hyperglycemic-hyperosmolar coma [facility]) (HCC) 10/13/2015   Stage 3b chronic kidney disease (CKD) (HCC) 10/13/2015   AKI (acute kidney injury) (HCC)  10/13/2015   Diastolic CHF (HCC) 10/13/2015   Acute encephalopathy 10/13/2015   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   Lactic acid acidosis    Diabetic foot ulcer (HCC) 12/08/2014   Symptomatic bradycardia 03/04/2014   OSA on CPAP with oxygen 03/04/2014   COPD (chronic obstructive pulmonary disease), on home O2 03/04/2014   Bradycardia 03/04/2014   Cellulitis 08/16/2013   Chronic diastolic heart failure (HCC) 08/16/2013   Sepsis (HCC) 08/16/2013   SOB (shortness of breath) 04/20/2013   Proteinuria 01/26/2013   Acute on chronic kidney failure (HCC) 01/24/2013   Abscess of groin, right 01/23/2013   HTN (hypertension) 01/23/2013   Leukocytosis 01/23/2013   Anemia of chronic disease 01/23/2013   Hypothyroidism 01/23/2013   Hyponatremia 10/06/2011   HYPERCHOLESTEROLEMIA 08/17/2010   Depression 02/01/2010   TOBACCO ABUSE 06/04/2009   Uncontrolled type 2 diabetes mellitus with hyperglycemia (HCC) 08/06/2007    Palliative Care Assessment & Plan    Assessment/Recommendations/Plan  Continue current plan of care Discussed with RN- we really need meal intake documented- this is important for prognosis and decision making- especially if comfort/hospice is chosen and family desires residential hospice Continue Vaseline to lips 4 times daily Tylenol 650 3 times daily p.o. for pain related to her sacral wound PMT will continue to follow  Code Status: DNR  Prognosis:  Unable to determine  Discharge Planning: To Be Determined   Thank you for allowing the Palliative Medicine Team to assist in the care of this patient.   Greater than 50%  of this time was spent counseling and coordinating care related to the above assessment and plan.  Ocie Bob, AGNP-C Palliative Medicine   Please contact Palliative Medicine Team phone at [phone] for questions and concerns.

## 2022-04-06 NOTE — Progress Notes (Signed)
Occupational Therapy Treatment Patient Details Name: [name] MRN: [id number] DOB: 08-20-1961 Today's Date: 04/06/2022   History of present illness 61 yo female presents to [facility] from [facility] on 8/2 for RLE wound infection, sacral wound, AKI. s/p cortrak 8/7, s/p R AKA 8/9. PMH includes bilat BKA, COPD, OSA, T2DM, depression, HLD, HTN, anemia, CKD, CHF, TIA, GERD, gastroparesis, bradycardia s/p pacemaker, and anxiety.   OT comments  Patient received in supine and required max assist of 2 to get to EOB. Patient required max to total assist for sitting balance while on EOB and was unable to participate in self feeding or grooming tasks. Patient was returned to supine due to complaints of sacral wound pain.  Patient was mod assist to roll side to side to change bed pads. Self feeding attempted with patient able to manage cup but refused to attempt to feed self but did take 3 bites and was able to wipe face. Patient is limited by pain and will continue to be followed by acute OT.    Recommendations for follow up therapy are one component of a multi-disciplinary discharge planning process, led by the attending physician.  Recommendations may be updated based on patient status, additional functional criteria and insurance authorization.    Follow Up Recommendations  Skilled nursing-short term rehab (<3 hours/day)    Assistance Recommended at Discharge Frequent or constant Supervision/Assistance  Patient can return home with the following  Two people to help with walking and/or transfers;A lot of help with bathing/dressing/bathroom;Assistance with feeding;Assistance with cooking/housework;Direct supervision/assist for medications management;Direct supervision/assist for financial management;Assist for transportation;Help with stairs or ramp for entrance   Equipment Recommendations  Other (comment) (TBD)    Recommendations for Other Services      Precautions / Restrictions  Precautions Precautions: Fall Precaution Comments: R AKA (with wound vac), L BKA, sacral wound, flexi-seal and on airbed Restrictions Weight Bearing Restrictions: Yes RLE Weight Bearing: Non weight bearing       Mobility Bed Mobility Overal bed mobility: Needs Assistance Bed Mobility: Rolling, Supine to Sit, Sit to Supine Rolling: Mod assist   Supine to sit: Max assist, +2 for physical assistance, HOB elevated Sit to supine: Mod assist, +2 for physical assistance   General bed mobility comments: patient with increased complaints of pain at sacral area while sitting on EOB    Transfers                   General transfer comment: defer due to pain     Balance Overall balance assessment: Needs assistance Sitting-balance support: Bilateral upper extremity supported Sitting balance-[name]: Zero Sitting balance - Comments: mod assist of 2 to maintain balance on EOB Postural control: Posterior lean                                 ADL either performed or assessed with clinical judgement   ADL Overall ADL's : Needs assistance/impaired Eating/Feeding: Maximal assistance;Bed level Eating/Feeding Details (indicate cue type and reason): attempted to have patient address feeding seated on EOB but would not participate. Able to manage cup while in supine, would only take bites of apple sauce if fed Grooming: Wash/dry face;Minimal assistance;Bed level Grooming Details (indicate cue type and reason): washed face at bed level  General ADL Comments: refused to feed self, would only take bites if fed    Extremity/Trunk Assessment              Vision       Perception     Praxis      Cognition Arousal/Alertness: Awake/alert (became more lethargic at end of session) Behavior During Therapy: Flat affect Overall Cognitive Status: Impaired/Different from baseline Area of Impairment: Orientation, Attention,  Memory, Following commands, Awareness, Problem solving, Safety/judgement                 Orientation Level: Disoriented to, Place, Time, Situation Current Attention Level: Focused   Following Commands: Follows one step commands inconsistently Safety/Judgement: Decreased awareness of safety, Decreased awareness of deficits Awareness: Intellectual Problem Solving: Slow processing, Decreased initiation, Difficulty sequencing, Requires verbal cues, Requires tactile cues General Comments: oriented to self only. Required frequent cues to follow commands        Exercises      Shoulder Instructions       General Comments      Pertinent Vitals/ Pain       Pain Assessment Pain Assessment: Faces Faces Pain Scale: Hurts even more Pain Location: R residual limb, sacral wound Pain Descriptors / Indicators: Discomfort, Grimacing, Guarding Pain Intervention(s): Limited activity within patient's tolerance, Monitored during session, Repositioned  Home Living                                          Prior Functioning/Environment              Frequency  Min 2X/week        Progress Toward Goals  OT Goals(current goals can now be found in the care plan section)  Progress towards OT goals: Progressing toward goals  Acute Rehab OT Goals OT Goal Formulation: Patient unable to participate in goal setting Time For Goal Achievement: 04/13/22 Potential to Achieve Goals: Fair ADL Goals Pt Will Perform Eating: with set-up;sitting;bed level Pt Will Perform Grooming: with min assist;sitting;bed level Pt Will Perform Upper Body Dressing: with min assist;sitting Pt Will Transfer to Toilet: with mod assist;with transfer board;anterior/posterior transfer;bedside commode Additional ADL Goal #1: Pt will perform bed mobility with min assist using bed rails in preparation for ADLs and for pressure relief. Additional ADL Goal #2: Pt will demonstrate sustained attention as  a precursor to ADLs.  Plan Discharge plan remains appropriate    Co-evaluation    PT/OT/SLP Co-Evaluation/Treatment: Yes Reason for Co-Treatment: For patient/therapist safety;Necessary to address cognition/behavior during functional activity   OT goals addressed during session: ADL's and self-care      AM-PAC OT "6 Clicks" Daily Activity     Outcome Measure   Help from another person eating meals?: A Lot Help from another person taking care of personal grooming?: A Lot Help from another person toileting, which includes using toliet, bedpan, or urinal?: Total Help from another person bathing (including washing, rinsing, drying)?: Total Help from another person to put on and taking off regular upper body clothing?: Total Help from another person to put on and taking off regular lower body clothing?: Total 6 Click Score: 8    End of Session    OT Visit Diagnosis: Pain;Muscle weakness (generalized) (M62.81);Other symptoms and signs involving cognitive function   Activity Tolerance Patient limited by pain   Patient Left in bed;with call bell/phone within reach   Nurse  Communication Mobility status        Time: 1227-1253 OT Time Calculation (min): 26 min  Charges: OT General Charges $OT Visit: 1 Visit OT Treatments $Self Care/Home Management : 8-22 mins  Lodema Hong, [place]  Office [place] 04/06/2022, 1:51 PM

## 2022-04-06 NOTE — Progress Notes (Signed)
Nutrition Follow-up  DOCUMENTATION CODES:   Not applicable  INTERVENTION:   Tube feeds via Cortrak: Continue Glucerna 1.2 at 65 mL/hr (1560 mL/day) 60 mL ProSource TF 20 - BID 150 mL free water flush q4h Provides 2030 kcal, 134 gm of protein, and 2156 mL total free water daily. Continue Multivitamin w/ minerals daily Continue Juven BID, each packet provides 95 calories, 2.5 grams of protein to support wound healing Recommend increasing free water flushes  NUTRITION DIAGNOSIS:   Increased nutrient needs related to wound healing as evidenced by estimated needs. - Ongoing   GOAL:   Patient will meet greater than or equal to 90% of their needs - Ongoing   MONITOR:   PO intake, Supplement acceptance, Labs, TF tolerance, I & O's  REASON FOR ASSESSMENT:   Malnutrition Screening Tool, Consult Assessment of nutrition requirement/status  ASSESSMENT:   Pt admitted with hypotension, found to have AKI. PMH significant for CHF, COPD, GERD, HLD, HTN, hypothyroidism, OSA on CPAP, PVD, sinus bradycardia s/p PPM, T2DM. Recent admission 6/18-6/27 for sacral wound requiring I&D.   8/07 - Cortrak placed (tip antrum or pyloric region) 8/09 - s/p R AKA 8/12 - rectal tube placed  8/16 - rectal tube removed  Pt resting in bed at time of RD visit. Pt reports that she is ok. When asked if she would like to eat she told this RD no.  Meal Completions: 0-10%  Pt with elevated CBG's, pt has been evaluated by DM coordinator. Per DM coordinator note, recommend adding NovoLog 5 units q4h for tube feed coverage.  Discussed case with MD.   Medications reviewed and include: Vitamin C, Colace, NovoLog SSI, Semglee, MVI, Protonix, Zinc Labs reviewed: BUN 107, Creatinine 1.82, 24 hr CBG 200-262  UOP: 2400 mL x 24 hours  Diet Order:   Diet Order             Diet Carb Modified Fluid consistency: Thin; Room service appropriate? No  Diet effective now                   EDUCATION NEEDS:    Not appropriate for education at this time  Skin:  Skin Assessment: Skin Integrity Issues: Unstageable: L buttocks Wound VAC: RLE  Last BM:  8/17 - type 7  Height:   Ht Readings from Last 1 Encounters:  03/29/22 5\' 5"  (1.651 m)    Weight:   Wt Readings from Last 1 Encounters:  04/04/22 101 kg    BMI:  Body mass index is 37.05 kg/m.  Estimated Nutritional Needs:   Kcal:  1800-2000  Protein:  90-105 grams  Fluid:  >/= 1.8 L    04/06/22 RD, LDN Clinical Dietitian See [facility] for contact information.

## 2022-04-06 NOTE — Progress Notes (Addendum)
Physical Therapy Treatment Patient Details Name: [name] MRN: [id number] DOB: 11/24/1960 Today's Date: 04/06/2022   History of Present Illness 61 yo female presents to [facility] from [facility] on 8/2 for RLE wound infection, sacral wound, AKI. s/p cortrak 8/7, s/p R AKA 8/9. PMH includes bilat BKA, COPD, OSA, T2DM, depression, HLD, HTN, anemia, CKD, CHF, TIA, GERD, gastroparesis, bradycardia s/p pacemaker, and anxiety.    PT Comments    Pt received in supine, lethargic but following simple mobility commands with increased time with multimodal cues. Pt with poor seated tolerance due to pain, able to tolerate dangling EOB ~2 minutes on first attempt, then ~5 minutes on second attempt. Pt performed bed mobility with +2 maxA to totalA to raise trunk and maintain upright seated posture. Pt with posterior lean and fear of falls limiting tolerance to long sit posture along with RLE pain. Pt encouraged to self-feed but refusing to attempt after single bite/sip, needed staff assist to feed additional bites x2. Pt continues to benefit from PT services to progress toward functional mobility goals.    Recommendations for follow up therapy are one component of a multi-disciplinary discharge planning process, led by the attending physician.  Recommendations may be updated based on patient status, additional functional criteria and insurance authorization.  Follow Up Recommendations  Skilled nursing-short term rehab (<3 hours/day) Can patient physically be transported by private vehicle: No   Assistance Recommended at Discharge Frequent or constant Supervision/Assistance  Patient can return home with the following Two people to help with walking and/or transfers;Two people to help with bathing/dressing/bathroom;Help with stairs or ramp for entrance   Equipment Recommendations  None recommended by PT (TBD post-acute)    Recommendations for Other Services       Precautions / Restrictions  Precautions Precautions: Fall Precaution Comments: R AKA (with wound vac), L BKA, sacral wound, urinary catheter, air bed Restrictions Weight Bearing Restrictions: Yes RLE Weight Bearing: Non weight bearing     Mobility  Bed Mobility Overal bed mobility: Needs Assistance Bed Mobility: Rolling, Sidelying to Sit, Sit to Supine, Sit to Sidelying Rolling: Mod assist Sidelying to sit: Max assist, +2 for physical assistance Supine to sit: Max assist, +2 for physical assistance, HOB elevated Sit to supine: Max assist, +2 for physical assistance Sit to sidelying: Max assist, +2 for physical assistance General bed mobility comments: Pt c/o fear of falls (posterior lean) upon reaching upright posture, pt rolling to L/R x6 reps with cues for cross-body reaching; log roll to L EOB, returned to supine for rest after ~2 minutes, then second time able to sit upright ~5 minutes after repositioned.    Transfers                   General transfer comment: defer due to pt lethargy despite repositioning        Balance Overall balance assessment: Needs assistance Sitting-balance support: Bilateral upper extremity supported Sitting balance-[name]: Zero Sitting balance - Comments: totalA for attempting to sit upright; unable to self-support in air bed (+2 maxA to totalA with upright sitting; pt unable to self-support in air bed but is utilizing BUE to attempt to self-support) Postural control: Posterior lean                                  Cognition Arousal/Alertness: Lethargic Behavior During Therapy: Flat affect Overall Cognitive Status: Impaired/Different from baseline Area of Impairment: Orientation, Attention,  Memory, Following commands, Awareness, Problem solving, Safety/judgement                 Orientation Level: Disoriented to, Place, Time, Situation Current Attention Level: Focused Memory: Decreased recall of precautions, Decreased short-term  memory Following Commands: Follows one step commands inconsistently Safety/Judgement: Decreased awareness of safety, Decreased awareness of deficits Awareness: Intellectual Problem Solving: Slow processing, Decreased initiation, Difficulty sequencing, Requires verbal cues, Requires tactile cues General Comments: oriented to self only, needs max multimodal cues for simple mobility tasks, slow processing and apprehensive to attempt bed mobility but following most cues with increased time. Pt encouraged to take bites of food/sips of drink but only agreeable to 2 bites/sips during session with max encouragement.        Exercises Amputee Exercises Quad Sets: AAROM, Left, 5 reps, Supine Straight Leg Raises: AAROM, Right, 5 reps, Supine Other Exercises Other Exercises: supine BUE A/AAROM: BUE rowing each UE x10-15 reps ea with consistent tactile cues to initiate 2/2 lethargy    General Comments General comments (skin integrity, edema, etc.): BP stable from supine to seated transfer      Pertinent Vitals/Pain Pain Assessment Pain Assessment: Faces Faces Pain Scale: Hurts even more Pain Location: R residual limb, sacral wound with long sitting in bed Pain Descriptors / Indicators: Discomfort, Grimacing, Moaning Pain Intervention(s): Limited activity within patient's tolerance, Monitored during session, Repositioned     PT Goals (current goals can now be found in the care plan section) Acute Rehab PT Goals Patient Stated Goal: less pain PT Goal Formulation: With patient Time For Goal Achievement: 04/13/22 Progress towards PT goals: Progressing toward goals (lethargy limiting progress but able to respond to more cues this session than initially)    Frequency    Min 3X/week      PT Plan Current plan remains appropriate    Co-evaluation PT/OT/SLP Co-Evaluation/Treatment: Yes Reason for Co-Treatment: For patient/therapist safety;To address functional/ADL transfers PT goals  addressed during session: Mobility/safety with mobility;Balance        AM-PAC PT "6 Clicks" Mobility   Outcome Measure  Help needed turning from your back to your side while in a flat bed without using bedrails?: A Lot Help needed moving from lying on your back to sitting on the side of a flat bed without using bedrails?: Total Help needed moving to and from a bed to a chair (including a wheelchair)?: Total Help needed standing up from a chair using your arms (e.g., wheelchair or bedside chair)?: Total Help needed to walk in hospital room?: Total Help needed climbing 3-5 steps with a railing? : Total 6 Click Score: 7    End of Session   Activity Tolerance: Patient limited by lethargy;Patient limited by pain Patient left: in bed;with call bell/phone within reach (HOB elevated (cortrak needs HOB >30*), pillows under BUE for support) Nurse Communication: Mobility status;Other (comment) (Pt sacral dressing is loose on the R border, may need reinforcement) PT Visit Diagnosis: Muscle weakness (generalized) (M62.81);Pain     Time: 1227-1253 PT Time Calculation (min) (ACUTE ONLY): 26 min  Charges:  $Therapeutic Activity: 8-22 mins                     Geoff Dacanay P., PTA Acute Rehabilitation Services Secure Chat Preferred 9a-5:30pm Office: [phone]    Dorathy Kinsman [facility] 04/06/2022, 2:03 PM

## 2022-04-06 NOTE — Progress Notes (Addendum)
HD#15 Subjective:   Summary:  [name] is a 61 y.o. with a pertinent PMH of HFpEF, COPD, GERD, HLD, HTN, hypothyroidism, OSA on CPAP, PVD, sinus bradycardia s/p pacemaker, and insulin-dependent type 2 diabetes who presented with hypotension and admitted for osteomyelitis and malnutrition.  Overnight Events: Paged about patient pulling the flexi-seal last night, mitts were placed but unable to redirect patient, seroquel 12.5 mg was given once  Ms. Plotner was alert but oriented to only self today. Denies any pain. She states she does not want her breakfast tray.    Objective:  Vital signs in last 24 hours: Vitals:   04/06/22 0000 04/06/22 0409 04/06/22 0802 04/06/22 1208  BP: (!) 158/66 (!) 157/59 (!) 128/49 (!) 136/51  Pulse:  78 77 69  Resp: 15 18 (!) 23 16  Temp: 97.9 F (36.6 C)  97.6 F (36.4 C) 97.7 F (36.5 C)  TempSrc: Oral  Oral Oral  SpO2:  96%  93%  Weight:      Height:       Supplemental O2: Room Air SpO2: 93 %  Physical Exam:  Constitutional: ill appearing, in no acute distress HENT: normocephalic atraumatic, NG tube in place  Mouth: mucous membrane moist, few white plaques noted Neck: supple Cardiovascular: regular rate and rhythm, no m/r/g MSK: Left BKA, Right AKA, sacral wound has clean dressing on  Neurological: alert & oriented to self, not time or place Skin: warm and dry Psych: normal mood, cooperative behavior  Filed Weights   04/02/22 0441 04/03/22 0443 04/04/22 0439  Weight: 101 kg 101 kg 101 kg     Intake/Output Summary (Last 24 hours) at 04/06/2022 1406 Last data filed at 04/06/2022 0914 Gross per 24 hour  Intake --  Output 1495 ml  Net -1495 ml    Net IO Since Admission: -12,450.51 mL [04/06/22 1406]  Pertinent Labs:    Latest Ref Rng & Units 04/06/2022    3:55 AM 04/05/2022    4:47 AM 04/04/2022    4:44 AM  CBC  WBC 4.0 - 10.5 K/uL 8.8  7.2  7.4   Hemoglobin 12.0 - 15.0 g/dL 9.6  9.3  9.8   Hematocrit 36.0 - 46.0 % 28.9   28.8  29.2   Platelets 150 - 400 K/uL 345  313  271        Latest Ref Rng & Units 04/06/2022    3:55 AM 04/05/2022    4:47 AM 04/04/2022    4:44 AM  CMP  Glucose 70 - 99 mg/dL 283  151  761   BUN 6 - 20 mg/dL 607  371  93   Creatinine 0.44 - 1.00 mg/dL 0.62  6.94  8.54   Sodium 135 - 145 mmol/L 135  135  137   Potassium 3.5 - 5.1 mmol/L 4.6  4.2  3.8   Chloride 98 - 111 mmol/L 95  96  100   CO2 22 - 32 mmol/L 27  27  27    Calcium 8.9 - 10.3 mg/dL 9.5  9.7     Assessment/Plan:   Principal Problem:   AKI (acute kidney injury) (HCC) Active Problems:   Uncontrolled type 2 diabetes mellitus with hyperglycemia (HCC)   Dehiscence of amputation stump of right lower extremity (HCC)   Sacral wound   Severe protein-calorie malnutrition (HCC)   Pressure injury of skin  Patient Summary: [name] is a 61 y.o. with a pertinent PMH of HFpEF, COPD, GERD, HLD, HTN, hypothyroidism, OSA  on CPAP, PVD, sinus bradycardia s/p pacemaker, and insulin-dependent type 2 diabetes who presented with hypotension and admitted for osteomyelitis and malnutrition.   Acute on chronic kidney injury 3B Elevated BUN Hyperkalemia Potassium 4.6. AKI likely pre-renal from dehydration. Continue tube feeding and free water 150 ml every 4 hours. Creatinine improving 1.82. BUN still increasing to 107. Rising BUN could be from improved nutritional status given her poor intake before. No evidence of bleeding, Hgb has been stable. If worsening renal function, consider ultrasound. -BMP  Encephalopathy Likely multi-factorial with history of severe malnutrition, chronic wounds, RLE osteomyelitis s/p amputation, and kidney injury. Goals of care discussion as per below. Will continue full scope of care measures aside from DNR status. -continue to treat reversible factors -ongoing family discussions with palliative care  Right LE Osteomyelitis s/p Right AKA P/O day 8. No acute complications post operatively, wound vacc  in place. Continues to be encephalopathic from multiple etiologies.  -continue wound care   Severe malnutrition  Cortrak in place. On Glucerna 1.2 at 65 ml/hr. OT noted she is able to self feed. Palliative care discussed with nursing to document meal intake for future decision/discussion regarding hospice. -continue tube feeding -continue with po intake if tolerating    Insulin-dependent type 2 diabetes mellitus Episodic Hypoglycemia secondary to malnutrition No hypoglycemia since starting tube feeds. CBG overnight remains in 200s. Will increase semglee and continue every 4 hour novolog.   -continue CBG every 4 hours -increase semglee to 24 units daily -continue novolog SSI-M every 4 hours  Hypotension Improved with management of osteomyelitis and malnutrition.   Chronic sacral wound Wound care management, continue to try and keep it clean with pressure offloading. She pulled her flexi-seal out last night, given mitts but unable to redirect by staff. She was given one dose of seroquel 12.5 mg. Will consider putting back flexi-seal if trouble with keeping sacral wound area clean.  -Continue wound care -Continue home Tylenol and oxycodone pain regimen -consider placing flexi-seal back on   GERD Continue Protonix 40 mg daily.  Thrush Patient has few white plaques on hard palate. Noted improvement today on exam. Was on nystatin but concern about esophageal coverage. Continue renally dosed diflucan. -continue diflucan 200 mg, day 4/14  Advance Care Planning Family meeting 04/01/22, patient transitioned to DNR. Plan to readdress goals of care after 7 days of treatment. If no improvement in mentation, patient will consider comfort care measures. They do not believe Magali would want any artificial treatment. Continue full scope of care.  -appreciate palliative care assistance with her care -currently DNR -will re-evaluate with family on Saturday  -continue full measures aside from DNR  status.   Diet: Tube Feeds (Glucerna 65 ml/hr) IVF: None,None VTE: Enoxaparin Code: DNR PT/OT recs: SNF  Dispo: Anticipated discharge pending further management of acute problems above and ongoing family discussions.   Rana Snare, DO Internal Medicine Resident PGY-1 Please contact the on call pager after 5 pm and on weekends at [phone].

## 2022-04-06 NOTE — Inpatient Diabetes Management (Signed)
Inpatient Diabetes Program Recommendations  AACE/ADA: New Consensus Statement on Inpatient Glycemic Control (2015)  Target Ranges:  Prepandial:   less than 140 mg/dL      Peak postprandial:   less than 180 mg/dL (1-2 hours)      Critically ill patients:  140 - 180 mg/dL   Lab Results  Component Value Date   GLUCAP 200 (H) 04/06/2022   HGBA1C 12.6 (H) 02/06/2022    Review of Glycemic Control  Latest Reference Range & Units 04/05/22 08:12 04/05/22 11:59 04/05/22 16:05 04/05/22 20:04 04/06/22 00:52 04/06/22 03:45 04/06/22 08:04  Glucose-Capillary 70 - 99 mg/dL 789 (H) 381 (H) 017 (H) 262 (H) 231 (H) 229 (H) 200 (H)   Diabetes history: DM type 2 Outpatient Diabetes medications: Basaglar 20 units daily, Amaryl 4 mg BID, Farxiga 10 mg daily Current orders for Inpatient glycemic control: Semglee 20 units daily Novolog 0-15 units every 4 hours  Glucerna 65 ml/hour  Inpatient Diabetes Program Recommendations:    -   Consider adding Novolog 5 units Q4 hours Tube Feed Coverage (Do not give if tube feeds are stopped or held )  Thanks,  Christena Deem RN, MSN, BC-ADM Inpatient Diabetes Coordinator Team Pager [phone] (8a-5p)

## 2022-04-07 DIAGNOSIS — N1832 Chronic kidney disease, stage 3b: Secondary | ICD-10-CM | POA: Diagnosis not present

## 2022-04-07 DIAGNOSIS — R7989 Other specified abnormal findings of blood chemistry: Secondary | ICD-10-CM

## 2022-04-07 DIAGNOSIS — E875 Hyperkalemia: Secondary | ICD-10-CM | POA: Diagnosis not present

## 2022-04-07 DIAGNOSIS — N179 Acute kidney failure, unspecified: Secondary | ICD-10-CM | POA: Diagnosis not present

## 2022-04-07 LAB — GLUCOSE, CAPILLARY
Glucose-Capillary: 244 mg/dL — ABNORMAL HIGH (ref 70–99)
Glucose-Capillary: 276 mg/dL — ABNORMAL HIGH (ref 70–99)
Glucose-Capillary: 268 mg/dL — ABNORMAL HIGH (ref 70–99)
Glucose-Capillary: 231 mg/dL — ABNORMAL HIGH (ref 70–99)
Glucose-Capillary: 174 mg/dL — ABNORMAL HIGH (ref 70–99)
Glucose-Capillary: 198 mg/dL — ABNORMAL HIGH (ref 70–99)
Glucose-Capillary: 119 mg/dL — ABNORMAL HIGH (ref 70–99)
Glucose-Capillary: 144 mg/dL — ABNORMAL HIGH (ref 70–99)

## 2022-04-07 LAB — BASIC METABOLIC PANEL
Anion gap: 11 (ref 5–15)
BUN: 120 mg/dL — ABNORMAL HIGH (ref 6–20)
CO2: 30 mmol/L (ref 22–32)
Calcium: 9.6 mg/dL (ref 8.9–10.3)
Chloride: 94 mmol/L — ABNORMAL LOW (ref 98–111)
Creatinine, Ser: 2.03 mg/dL — ABNORMAL HIGH (ref 0.44–1.00)
GFR, Estimated: 28 mL/min — ABNORMAL LOW (ref >60)
Glucose, Bld: 269 mg/dL — ABNORMAL HIGH (ref 70–99)
Potassium: 5.1 mmol/L (ref 3.5–5.1)
Sodium: 135 mmol/L (ref 135–145)

## 2022-04-07 MED ORDER — INSULIN ASPART 100 UNIT/ML IJ SOLN
5.0000 [IU] | INTRAMUSCULAR | Status: DC
  Administered 2022-04-07 – 2022-04-08 (×6): 5 [IU] via SUBCUTANEOUS

## 2022-04-07 MED ORDER — INSULIN ASPART 100 UNIT/ML IJ SOLN
0.0000 [IU] | INTRAMUSCULAR | Status: DC
  Administered 2022-04-07 (×2): 4 [IU] via SUBCUTANEOUS
  Administered 2022-04-08: 7 [IU] via SUBCUTANEOUS
  Administered 2022-04-08 (×2): 4 [IU] via SUBCUTANEOUS
  Administered 2022-04-08: 3 [IU] via SUBCUTANEOUS

## 2022-04-07 MED ORDER — LACTATED RINGERS IV BOLUS
500.0000 mL | Freq: Once | INTRAVENOUS | Status: AC
Start: 2022-04-07 — End: 2022-04-07
  Administered 2022-04-07: 500 mL via INTRAVENOUS

## 2022-04-07 NOTE — Inpatient Diabetes Management (Signed)
Inpatient Diabetes Program Recommendations  AACE/ADA: New Consensus Statement on Inpatient Glycemic Control (2015)  Target Ranges:  Prepandial:   less than 140 mg/dL      Peak postprandial:   less than 180 mg/dL (1-2 hours)      Critically ill patients:  140 - 180 mg/dL   Lab Results  Component Value Date   GLUCAP 231 (H) 04/07/2022   HGBA1C 12.6 (H) 02/06/2022    Review of Glycemic Control  Latest Reference Range & Units 04/06/22 08:04 04/06/22 12:10 04/06/22 15:55 04/06/22 20:45 04/07/22 01:14 04/07/22 02:00 04/07/22 04:25 04/07/22 07:38  Glucose-Capillary 70 - 99 mg/dL 161 (H) 096 (H) 045 (H) 225 (H) 244 (H) 276 (H) 268 (H) 231 (H)   Diabetes history: DM 2 Outpatient Diabetes medications: Farxiga 10 mg Daily, Amaryl 4 mg bid  (Tresiba 20 units Daily in the past) Current orders for Inpatient glycemic control:  Semglee 24 hours Daily Novolog 0-20 units Q4 hours  Glucerna 65 ml/hour  Inpatient Diabetes Program Recommendations:    Pt received a total of 36 units of Novolog in the last 24 hours. Note Semglee increased slightly from 20 to 24 units  Based on insulin requirements in the last 24 hours and pt on Tube Feeds Consider:  - Add Novolog 5 units Q4 hours Tube Feed Coverage (do not give if tube feeds are stopped or held for any reason)  Thanks,  Christena Deem RN, MSN, BC-ADM Inpatient Diabetes Coordinator Team Pager [phone] (8a-5p)

## 2022-04-07 NOTE — Progress Notes (Signed)
HD#16 Subjective:   Summary:  [name] is a 61 y.o. with a pertinent PMH of HFpEF, COPD, GERD, HLD, HTN, hypothyroidism, OSA on CPAP, PVD, sinus bradycardia s/p pacemaker, and insulin-dependent type 2 diabetes who presented with hypotension and admitted for osteomyelitis and malnutrition.  Overnight Events: none  [name] is oriented to person and place. Denies any pain at this time. She will only answer some questions. No significant change from past few days.     Objective:  Vital signs in last 24 hours: Vitals:   04/06/22 2041 04/07/22 0000 04/07/22 0435 04/07/22 0440  BP:  125/61 (!) 122/53   Pulse: 66 71 70   Resp: (!) 8 14 17    Temp: 97.8 F (36.6 C)  97.8 F (36.6 C)   TempSrc: Axillary  Axillary   SpO2: 98% 98% 98%   Weight:    110 kg  Height:       Supplemental O2: Room Air SpO2: 98 %  Physical Exam:  Constitutional: ill appearing, in no acute distress HENT: normocephalic atraumatic, NG tube in place  Neck: supple Cardiovascular: regular rate and rhythm, no m/r/g MSK: Left BKA, Right AKA Neurological: alert & oriented to self and place Skin: warm and dry Psych: normal mood, cooperative behavior    Filed Weights   04/03/22 0443 04/04/22 0439 04/07/22 0440  Weight: 101 kg 101 kg 110 kg     Intake/Output Summary (Last 24 hours) at 04/07/2022 0617 Last data filed at 04/07/2022 0430 Gross per 24 hour  Intake 1910 ml  Output 1100 ml  Net 810 ml    Net IO Since Admission: -10,990.51 mL [04/07/22 0617]  Pertinent Labs:    Latest Ref Rng & Units 04/06/2022    3:55 AM 04/05/2022    4:47 AM 04/04/2022    4:44 AM  CBC  WBC 4.0 - 10.5 K/uL 8.8  7.2  7.4   Hemoglobin 12.0 - 15.0 g/dL 9.6  9.3  9.8   Hematocrit 36.0 - 46.0 % 28.9  28.8  29.2   Platelets 150 - 400 K/uL 345  313  271        Latest Ref Rng & Units 04/07/2022    3:51 AM 04/06/2022    3:55 AM 04/05/2022    4:47 AM  CMP  Glucose 70 - 99 mg/dL 04/07/2022  563  875   BUN 6 - 20 mg/dL 643  329   518   Creatinine 0.44 - 1.00 mg/dL 841  6.60  6.30   Sodium 135 - 145 mmol/L 135  135  135   Potassium 3.5 - 5.1 mmol/L 5.1  4.6  4.2   Chloride 98 - 111 mmol/L 94  95  96   CO2 22 - 32 mmol/L 30  27  27    Calcium 8.9 - 10.3 mg/dL 9.6  9.5  9.7    Assessment/Plan:   Principal Problem:   AKI (acute kidney injury) (HCC) Active Problems:   Uncontrolled type 2 diabetes mellitus with hyperglycemia (HCC)   Dehiscence of amputation stump of right lower extremity (HCC)   Sacral wound   Severe protein-calorie malnutrition (HCC)   Pressure injury of skin  Patient Summary: [name] is a 61 y.o. with a pertinent PMH of HFpEF, COPD, GERD, HLD, HTN, hypothyroidism, OSA on CPAP, PVD, sinus bradycardia s/p pacemaker, and insulin-dependent type 2 diabetes who presented with hypotension and admitted for osteomyelitis and malnutrition.   Acute on chronic kidney injury 3B Elevated BUN Hyperkalemia Potassium  5.1 today. Creatinine increase to 2.03. BUN still rising to 120. She is on tube feeding and free water 150 ml every 4 hours. Rising BUN could be from improved nutritional status given her poor intake before. No evidence of bleeding, Hgb has been stable. If worsening renal function, consider ultrasound. -gave LR 500 ml bolus this morning -repeat BMP, if potassium continues to increase, check EKG and give calcium gluconate  Encephalopathy No significant improvement over past few days. Still oriented x2 to person and place. Likely multi-factorial with history of severe malnutrition, chronic wounds, RLE osteomyelitis s/p amputation, and kidney injury. Goals of care discussion as per below. Will continue full scope of care measures aside from DNR status. -continue to treat reversible factors -ongoing family discussions with palliative care  Right LE Osteomyelitis s/p Right AKA P/O day 9. No acute complications post operatively, wound vacc in place. Continues to be encephalopathic from multiple  etiologies.  -continue wound care   Severe malnutrition  Cortrak in place. On Glucerna 1.2 at 65 ml/hr with carb modified diet. She did not eat any of her meals yesterday.  -continue tube feeding -continue nutritional supplements and meals    Insulin-dependent type 2 diabetes mellitus Episodic Hypoglycemia secondary to malnutrition CBG overnight remains in 200s with increased semglee . Will adjust novolog SSI sensitivity to resistant.  -continue CBG every 4 hours -continue semglee 24 units daily -adjusted novolog SSI (resistant) every 4 hours  Hypotension BP is in range 120s/60s. Better controlled now with improved nutrition.   Chronic sacral wound Wound care management, continue to try and keep it clean with pressure offloading. If needed, can put back on flexi-seal to keep stools from sacral wound.   -Continue wound care -Continue home Tylenol and oxycodone pain regimen   GERD Continue Protonix 40 mg daily.  Thrush Patient had few white plaques on hard palate. Was on nystatin but concern about esophageal coverage. Continue renally dosed diflucan. -continue diflucan 200 mg, day 5/14  Advance Care Planning Family meeting 04/01/22, patient transitioned to DNR. Plan to readdress goals of care on 8/19 with family. If no improvement in mentation, patient will consider comfort care measures. They do not believe Shavon would want any artificial treatment. Continue now with full scope of care aside from DNR status.  -appreciate palliative care assistance with her care -currently DNR -re-evaluate goals with patient and family tomorrow -continue full measures aside from DNR status.   Diet: Tube Feeds (Glucerna 65 ml/hr), carb modified with supplements IVF: None,None VTE: Enoxaparin Code: DNR PT/OT recs: SNF Family Update: called [name] and Dawn yesterday and discussed no significant change with patient  Dispo: Anticipated discharge pending further management of acute problems above  and ongoing family discussions.   Rana Snare, DO Internal Medicine Resident PGY-1 Please contact the on call pager after 5 pm and on weekends at [phone].

## 2022-04-07 NOTE — TOC Progression Note (Signed)
Transition of Care [facility]) - Progression Note    Patient Details  Name: [name] MRN: [id number] Date of Birth: [birth year]  [facility] CM/SW Contact  Mearl Latin, LCSW Phone Number: 04/07/2022, 8:59 AM  Clinical Narrative:    TOC continuing to follow.    Expected Discharge Plan: Skilled Nursing Facility Barriers to Discharge: Continued Medical Work up  Expected Discharge Plan and Services Expected Discharge Plan: Skilled Nursing Facility In-house Referral: Clinical Social Work   Post Acute Care Choice: Skilled Nursing Facility Living arrangements for the past 2 months: Skilled Nursing Facility                                       Social Determinants of Health (SDOH) Interventions    Readmission Risk Interventions    02/15/2022    9:01 AM 02/07/2022   12:17 PM  Readmission Risk Prevention Plan  Transportation Screening Complete Complete  PCP or Specialist Appt within 5-7 Days  Complete  PCP or Specialist Appt within 3-5 Days Complete   Home Care Screening  Complete  Medication Review (RN CM)  Complete  HRI or Home Care Consult Complete   Social Work Consult for Recovery Care Planning/Counseling Complete   Palliative Care Screening Not Applicable   Medication Review [employer]) Complete

## 2022-04-08 DIAGNOSIS — S31000A Unspecified open wound of lower back and pelvis without penetration into retroperitoneum, initial encounter: Secondary | ICD-10-CM | POA: Diagnosis not present

## 2022-04-08 DIAGNOSIS — N1832 Chronic kidney disease, stage 3b: Secondary | ICD-10-CM | POA: Diagnosis not present

## 2022-04-08 DIAGNOSIS — I959 Hypotension, unspecified: Secondary | ICD-10-CM

## 2022-04-08 DIAGNOSIS — R7989 Other specified abnormal findings of blood chemistry: Secondary | ICD-10-CM | POA: Diagnosis not present

## 2022-04-08 DIAGNOSIS — N179 Acute kidney failure, unspecified: Secondary | ICD-10-CM | POA: Diagnosis not present

## 2022-04-08 DIAGNOSIS — E875 Hyperkalemia: Secondary | ICD-10-CM | POA: Diagnosis not present

## 2022-04-08 DIAGNOSIS — T8781 Dehiscence of amputation stump: Secondary | ICD-10-CM | POA: Diagnosis not present

## 2022-04-08 LAB — BASIC METABOLIC PANEL
Anion gap: 12 (ref 5–15)
BUN: 140 mg/dL — ABNORMAL HIGH (ref 6–20)
CO2: 27 mmol/L (ref 22–32)
Calcium: 9.5 mg/dL (ref 8.9–10.3)
Chloride: 96 mmol/L — ABNORMAL LOW (ref 98–111)
Creatinine, Ser: 1.97 mg/dL — ABNORMAL HIGH (ref 0.44–1.00)
GFR, Estimated: 29 mL/min — ABNORMAL LOW (ref >60)
Glucose, Bld: 172 mg/dL — ABNORMAL HIGH (ref 70–99)
Potassium: 4.9 mmol/L (ref 3.5–5.1)
Sodium: 135 mmol/L (ref 135–145)

## 2022-04-08 LAB — GLUCOSE, CAPILLARY
Glucose-Capillary: 152 mg/dL — ABNORMAL HIGH (ref 70–99)
Glucose-Capillary: 176 mg/dL — ABNORMAL HIGH (ref 70–99)
Glucose-Capillary: 206 mg/dL — ABNORMAL HIGH (ref 70–99)

## 2022-04-08 MED ORDER — OXYCODONE HCL 5 MG PO TABS: 5.0000 mg | ORAL_TABLET | Freq: Four times a day (QID) | ORAL | Status: DC | PRN

## 2022-04-08 MED ORDER — MORPHINE SULFATE (CONCENTRATE) 10 MG/0.5ML PO SOLN
5.0000 mg | ORAL | Status: DC | PRN
  Administered 2022-04-08: 5 mg via ORAL
  Filled 2022-04-08: qty 0.5

## 2022-04-08 MED ORDER — LORAZEPAM 2 MG/ML PO CONC: 0.5000 mg | ORAL | Status: DC | PRN

## 2022-04-08 MED ORDER — LACTATED RINGERS IV SOLN: INTRAVENOUS | Status: DC

## 2022-04-08 NOTE — Significant Event (Signed)
Family meeting this afternoon with patient and family including her sister, two brothers and sister-in-law along with palliative care. Discussed no significant improvement over the past week even with aggressive treatments addressing her acute and chronic conditions. Discussed her declining food and only receiving nutrition via feeding tube. She would require extensive nursing care to address her wounds which have poor wound healing due to her condition. Family agreed to transition to full comfort care. Discussed inpatient hospice and referral to hospice facility. We answered and addressed all their concerns.

## 2022-04-08 NOTE — Progress Notes (Signed)
Chaplain responded to [name] and page from [name] to visit this pt and family.  Brother, [name], (10 years younger than patient) remained bedside, holding pt's hand.  He was teary and choked up.  Pt appears exhausted, disengaged, resigned and unwell.  At the introduction of this chaplain, pt closed her eyes and looked away, suggesting either indifference or resistance to thinking about this transition of care. Chaplain provided brief words of support and orientation to services. After a pause, brother asked if chaplain would pray, so prayer was offered.  Brother appears to be struggling to accept new reality. Pt does have good family support; other close family members left earlier.  Brother said, I love you, to pt, and then, You're going to be alright real soon," and then referred to pain medication that RN was administering.    Please contact our department for ongoing support.    [name], [place] Pager:  [phone]   04/08/22 1600  Clinical Encounter Type  Visited With Patient and family together  Visit Type Initial;Spiritual support (Transition of Care)  Referral From Palliative care team  Consult/Referral To Chaplain  Spiritual Encounters  Spiritual Needs Prayer  Stress Factors  Patient Stress Factors Health changes  Family Stress Factors Loss;Major life changes

## 2022-04-08 NOTE — TOC Initial Note (Signed)
Transition of Care [facility]) - Initial/Assessment Note    Patient Details  Name: [name] MRN: [id number] Date of Birth: 1961-08-19  Transition of Care [facility]) CM/SW Contact:    Elliot Gurney [place], [place] Phone Number: 04/08/2022, 3:11 PM  Clinical Narrative:                 Met with patient's brother and sister to discuss referral for inpatient hospice care. Patient's family agreeable to hospice care at [facility]. Primary contact is sister Sharyn Blitz [phone].  Transition of Care to continue to follow  Cliff, LCSW Transition of Care    Expected Discharge Plan: Skilled Nursing Facility Barriers to Discharge: Continued Medical Work up   Patient Goals and CMS Choice Patient states their goals for this hospitalization and ongoing recovery are:: Rehab CMS Medicare.gov Compare Post Acute Care list provided to:: Patient Choice offered to / list presented to : Sibling  Expected Discharge Plan and Services Expected Discharge Plan: [place] In-house Referral: Clinical Social Work   Post Acute Care Choice: [place] Living arrangements for the past 2 months: [place]                                      Prior Living Arrangements/Services Living arrangements for the past 2 months: [place] Lives with:: Self, Siblings Patient language and need for interpreter reviewed:: Yes Do you feel safe going back to the place where you live?: Yes      Need for Family Participation in Patient Care: Yes (Comment) Care giver support system in place?: Yes (comment)   Criminal Activity/Legal Involvement Pertinent to Current Situation/Hospitalization: No - Comment as needed  Activities of Daily Living Home Assistive Devices/Equipment: Wheelchair ADL Screening (condition at time of admission) Patient's cognitive ability adequate to safely complete daily activities?: No Is the patient deaf or have  difficulty hearing?: No Does the patient have difficulty seeing, even when wearing glasses/contacts?: No Does the patient have difficulty concentrating, remembering, or making decisions?: Yes Patient able to express need for assistance with ADLs?: No Does the patient have difficulty dressing or bathing?: Yes Independently performs ADLs?: No Communication: Independent Dressing (OT): Dependent Grooming: Dependent Feeding: Dependent Bathing: Dependent Toileting: Dependent In/Out Bed: Dependent Walks in Home: Dependent Does the patient have difficulty walking or climbing stairs?: Yes Weakness of Legs: Both Weakness of Arms/Hands: Both  Permission Sought/Granted Permission sought to share information with : Facility Media planner granted to share info w Relationship: Brother  Permission granted to share info w Contact Information: Audelia Acton  Emotional Assessment Appearance:: Appears stated age Attitude/Demeanor/Rapport: Engaged Affect (typically observed): Accepting Orientation: : Oriented to Self Alcohol / Substance Use: Not Applicable Psych Involvement: No (comment)  Admission diagnosis:  AKI (acute kidney injury) ([place]) [N17.9] Wound of sacral region, initial encounter [S31.000A] Hypotension, unspecified hypotension type [I95.9] Patient Active Problem List   Diagnosis Date Noted   Pressure injury of skin 04/03/2022   Dehiscence of amputation stump of right lower extremity ([place])    Sacral wound    Severe protein-calorie malnutrition ([place])    Cellulitis of sacral region 02/05/2022   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   Cystitis 10/16/2018   History of left below knee amputation ([place]) 07/16/2017   Abnormal urinalysis 06/18/2017   Diabetic gastroparesis ([place]) 06/18/2017   History of  TIA (transient ischemic attack) 06/18/2017   Hyperlipidemia 04/10/2017   Acquired absence of right leg below knee ([place]) 11/29/2016   Left facial  numbness    Cerebrovascular disease    Diabetic polyneuropathy associated with type 2 diabetes mellitus (HCC)    Chronic nonintractable headache    GERD (gastroesophageal reflux disease) 07/21/2016   TIA (transient ischemic attack) 07/04/2016   Hyperkalemia 12/07/2015   Cellulitis in diabetic foot ([place]) 12/05/2015   Herpes simplex labialis    Altered mental status    Thrush 10/20/2015   HSV-1 (herpes simplex virus 1) infection    Hallucinations    Acute respiratory failure with hypoxia ([place]) 10/14/2015   Hyperglycemia 10/13/2015   Diabetes mellitus due to underlying condition with hyperosmolarity without nonketotic hyperglycemic-hyperosmolar coma [facility]) ([place]) 10/13/2015   Stage 3b chronic kidney disease (CKD) ([place]) 10/13/2015   AKI (acute kidney injury) ([place]) 76/19/5093   Diastolic CHF ([place]) 26/71/2458   Acute encephalopathy 10/13/2015   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   Lactic acid acidosis    Diabetic foot ulcer ([place]) 12/08/2014   Symptomatic bradycardia 03/04/2014   OSA on CPAP with oxygen 03/04/2014   COPD (chronic obstructive pulmonary disease), on home O2 03/04/2014   Bradycardia 03/04/2014   Cellulitis 08/16/2013   Chronic diastolic heart failure ([place]) 08/16/2013   Sepsis ([place]) 08/16/2013   SOB (shortness of breath) 04/20/2013   Proteinuria 01/26/2013   Acute on chronic kidney failure ([place]) 01/24/2013   Abscess of groin, right 01/23/2013   HTN (hypertension) 01/23/2013   Leukocytosis 01/23/2013   Anemia of chronic disease 01/23/2013   Hypothyroidism 01/23/2013   Hyponatremia 10/06/2011   HYPERCHOLESTEROLEMIA 08/17/2010   Depression 02/01/2010   TOBACCO ABUSE 06/04/2009   Uncontrolled type 2 diabetes mellitus with hyperglycemia ([place]) 08/06/2007   PCP:  Jefm Petty, MD Pharmacy:   [place], [place] - [address] [place] [address] Phone: [phone] Fax: [place], [place] - [address] [place] [address] Phone: [phone] Fax: [phone]     Social Determinants of Health (SDOH) Interventions    Readmission Risk Interventions    02/15/2022    9:01 AM 02/07/2022   12:17 PM  Readmission Risk Prevention Plan  Transportation Screening Complete Complete  PCP or Specialist Appt within 5-7 Days  Complete  PCP or Specialist Appt within 3-5 Days Complete   Home Care Screening  Complete  Medication Review (RN CM)  Complete  HRI or Home Care Consult Complete   Social Work Consult for [place] Planning/Counseling Complete   Palliative Care Screening Not Applicable   Medication Review Press photographer) Complete

## 2022-04-08 NOTE — Progress Notes (Signed)
HD#17 Subjective:   Summary:  Ms. Jewell is a 61 y.o. with a pertinent PMH of HFpEF, COPD, GERD, HLD, HTN, hypothyroidism, OSA on CPAP, PVD, sinus bradycardia s/p pacemaker, and insulin-dependent type 2 diabetes who presented with hypotension and admitted for osteomyelitis and malnutrition.  Overnight Events: none  Ms. Linebaugh continues to be oriented to self and place. She reports pain from sacral wound. Family meeting scheduled for this afternoon.   Objective:  Vital signs in last 24 hours: Vitals:   04/07/22 1959 04/07/22 2321 04/08/22 0346 04/08/22 0500  BP: 135/61 (!) 141/59 (!) 143/59   Pulse: 73 73 70   Resp: 18 18 18    Temp: 98.7 F (37.1 C) 98.7 F (37.1 C) 97.8 F (36.6 C)   TempSrc: Axillary Oral Oral   SpO2: 96% 92% 97%   Weight:    111 kg  Height:       Supplemental O2: Room Air SpO2: 97 %  Physical Exam:  Constitutional: ill appearing, in no acute distress HENT: normocephalic atraumatic, NG tube in place  Neck: supple Cardiovascular: regular rate and rhythm MSK: Left BKA, Right AKA Neurological: alert & oriented to self and place, not time Skin: warm and dry Psych: normal mood, cooperative behavior    Filed Weights   04/04/22 0439 04/07/22 0440 04/08/22 0500  Weight: 101 kg 110 kg 111 kg     Intake/Output Summary (Last 24 hours) at 04/08/2022 0702 Last data filed at 04/08/2022 0346 Gross per 24 hour  Intake --  Output 900 ml  Net -900 ml    Net IO Since Admission: -11,890.51 mL [04/08/22 0702]  Pertinent Labs:    Latest Ref Rng & Units 04/06/2022    3:55 AM 04/05/2022    4:47 AM 04/04/2022    4:44 AM  CBC  WBC 4.0 - 10.5 K/uL 8.8  7.2  7.4   Hemoglobin 12.0 - 15.0 g/dL 9.6  9.3  9.8   Hematocrit 36.0 - 46.0 % 28.9  28.8  29.2   Platelets 150 - 400 K/uL 345  313  271        Latest Ref Rng & Units 04/08/2022    2:45 AM 04/07/2022    3:51 AM 04/06/2022    3:55 AM  CMP  Glucose 70 - 99 mg/dL 04/08/2022  182  993   BUN 6 - 20 mg/dL 716  967   893   Creatinine 0.44 - 1.00 mg/dL 810  1.75  1.02   Sodium 135 - 145 mmol/L 135  135  135   Potassium 3.5 - 5.1 mmol/L 4.9  5.1  4.6   Chloride 98 - 111 mmol/L 96  94  95   CO2 22 - 32 mmol/L 27  30  27    Calcium 8.9 - 10.3 mg/dL 9.5  9.6  9.5    Assessment/Plan:   Principal Problem:   AKI (acute kidney injury) (HCC) Active Problems:   Uncontrolled type 2 diabetes mellitus with hyperglycemia (HCC)   Dehiscence of amputation stump of right lower extremity (HCC)   Sacral wound   Severe protein-calorie malnutrition (HCC)   Pressure injury of skin  Patient Summary: Ms. Mercer is a 61 y.o. with a pertinent PMH of HFpEF, COPD, GERD, HLD, HTN, hypothyroidism, OSA on CPAP, PVD, sinus bradycardia s/p pacemaker, and insulin-dependent type 2 diabetes who presented with hypotension and admitted for osteomyelitis and malnutrition.   Acute on chronic kidney injury 3B Elevated BUN Hyperkalemia Potassium 4.9 today. Creatinine 1.97. BUN  still rising to 140. She is on tube feeding and free water 150 ml every 4 hours. Rising BUN could be from increased protein from tube feeding. If worsening renal function, consider ultrasound. -start LR 100 ml/hr for 24 hours  Encephalopathy No significant improvement. Still oriented x2 to person and place. Likely multi-factorial with history of severe malnutrition, chronic wounds, RLE osteomyelitis s/p amputation, and kidney injury. Will continue full scope of care measures aside from DNR status. Family discussion this afternoon to re-evaluate goals of care.  -continue to treat reversible factors -goals of care discussion today  Right LE Osteomyelitis s/p Right AKA P/O day 10. No acute complications post operatively, wound vacc in place. Continues to be encephalopathic from multiple etiologies.  -continue wound care   Severe malnutrition  Cortrak in place. On Glucerna 1.2 at 65 ml/hr with carb modified diet. She is taking some nutritional packets along with  tube feeding.  -continue tube feeding -continue nutritional supplements and meals    Insulin-dependent type 2 diabetes mellitus Episodic Hypoglycemia secondary to malnutrition CBG has been <180 with current regimen.  -continue CBG every 4 hours -continue semglee 24 units daily -novolog 5 units every 4 hours along with SSI (resistant) every 4 hours  Hypotension BP elevated this morning. If continues to be hypertensive and systolic BP >180, consider starting home lisinopril.   Chronic sacral wound Wound care management, continue to try and keep it clean with pressure offloading. On exam, stool noted around sacral wound dressing. Will need flexi-seal placed back on to protect the wound area.    -Continue wound care -Continue home Tylenol and oxycodone pain regimen   GERD Continue Protonix 40 mg daily.  Thrush Exam showed less plaques. Was on nystatin but concern about esophageal coverage. Continue renally dosed diflucan. -continue diflucan 200 mg, day 6/14  Advance Care Planning Family meeting 04/01/22, patient transitioned to DNR. Plan to readdress goals of care on 8/19 with family. If no improvement in mentation, patient will consider comfort care measures. They do not believe Isbella would want any artificial treatment. Continue now with full scope of care aside from DNR status.  -appreciate palliative care assistance with her care -currently DNR -family discussion this afternoon to readdress goals of care -continue full measures aside from DNR status.   Diet: Tube Feeds (Glucerna 65 ml/hr), carb modified with supplements IVF: None,None VTE: Enoxaparin Code: DNR PT/OT recs: SNF Family Update: family meeting this afternoon  Dispo: Anticipated discharge pending further management of acute problems above and ongoing family discussions.   Rana Snare, DO Internal Medicine Resident PGY-1 Please contact the on call pager after 5 pm and on weekends at [phone].

## 2022-04-08 NOTE — Progress Notes (Signed)
Palliative Medicine Inpatient Follow Up Note   HPI: [name] is a 61 year old female with PMH of HFpEF, COPD, GERD, HLD, HTN, hypothyroidism, OSA on CPAP, PVD, sinus tachycardia s/p pacemaker, and IDDM who has a chronic sacral wound, right BKA with exposed bone and poor wound healing who presented with hypotension and admitted for sepsis and malnutrition. [name] was placed for nutrition and right BKA revision with osteomyelitis. No improvement in neuro status one week post surgery.  Not eating food on trays, severe malnutrition.   Today's Discussion 04/08/2022  Chart reviewed inclusive of vital signs, progress notes, laboratory results, and diagnostic images.  Report received from  nurse [name]. He reports her sacral wound is significant  and her stool output is wicking up into the wound dressing.  Created space and opportunity for family to explore thoughts feelings and fears regarding current medical situation.  Joint family planning meeting with Dr. Alton Revere. Sister [name], [name], and second brother and sister-in-law. [name] had no medical or legal power of attorney documents that the family is aware of. Family shared stories of [name]. Dr. Chipper Herb reviewed medical concerns and course to date with lack of improvement with aggressive treatment and  tube feedings. We discussed the large difference in her abilities from preadmission to today. Sister [name] was concerned about the family making the wrong decision for [name]. They want to do what is best for her. They know she would not have wanted life prolonging measures in her current condition. They all want her to be comfortable. After discussions today, they are in agreement as a family to transition [name] to comfort care.   Provided [name] and [name] "Gone From My Site" booklet.  Questions and concerns addressed. After the meeting, [name] had questions for Dr. Chipper Herb about the ability for [name] body to be donated to science when she  dies. Dr. Chipper Herb agreed to explore information for the family.  They asked questions about finding out who her personal caregivers at home. We gave suggestions on looking on her fridge for the home health company and calling them.   Palliative Support Provided.   Objective Assessment: Vital Signs Vitals:   04/08/22 0346 04/08/22 0935  BP: (!) 143/59 (!) 150/62  Pulse: 70 73  Resp: 18 16  Temp: 97.8 F (36.6 C) 98 F (36.7 C)  SpO2: 97% 100%    Intake/Output Summary (Last 24 hours) at 04/08/2022 1411 Last data filed at 04/08/2022 0346 Gross per 24 hour  Intake --  Output 600 ml  Net -600 ml   Last Weight  Most recent update: 04/08/2022  5:03 AM    Weight  111 kg (244 lb 11.4 oz)             Gen: NAD, Ill appearing.  HEENT: moist mucous membranes, Gastric tube placed nasally CV: Normal rate and rhythm Pulm: Respirations nonlabored on oxygen by [name]. EXT: No edema, R AKA and L healed AKA Neuro: Alert and oriented x 1 self and family Skin: pale warm and dry   PPS: 20%  SUMMARY OF RECOMMENDATIONS   Code status: DNR  Initiate comfort measures  Palliative care symptom management: Pain: Acetaminophen as needed, Oxycodone 5 mg for moderate pain, Morphine 5 mg oral solution every 2 hours for severe pain Indigestion: Maalox/Mylanta as needed every 2 hours Cough: Robitussin  DM prn Throat irritation: chloraseptic spray Anxiety: Lorazepam oral solution 2 mg every 4 hours as needed Nausea/vomiting: Ondansetron 4 mg prn GI prophylaxis: Pantoprazole  40 mg daily  Case discussed with Dr. Tanya [name] and Nurse [name]. Secure chat message to healthcare  team with plan.   Prognosis: poor,  no improvement in mental status since stump revision over a week ago, large sacral wound not healing. . With transition to comfort care and no oral intake anticipate < 2 weeks.  Discharge Planning:  Hosp[facility] Care at [facility], transfer  on Sunday.  Time Spent: 75 minutes  Billing based on MDM:  High  Problems Addressed: One acute or chronic illness or injury that poses a threat to life or bodily function  Amount and/or Complexity of Data: Category 2:Independent interpretation of a test performed by another physician/other qualified health care professional (not separately reported) and Category 3:Discussion of management or test interpretation with external physician/other qualified health care professional/appropriate source (not separately reported)  Risks: Decision regarding hospitalization or escalation of hospital care and Decision not to resuscitate or to de-escalate care because of poor prognosis ______________________________________________________________________________________ Nichole Bushy, NP [name] Palliative Medicine Team Team Cell Phone: [phone] Please utilize secure chat with additional questions, if there is no response within 30 minutes please call the above phone number  Palliative Medicine Team providers are available by phone from 7am to 7pm daily and can be reached through the team cell phone.  Should this patient require assistance outside of these hours, please call the patient's attending physician.

## 2022-04-08 NOTE — Progress Notes (Addendum)
Civil engineer, contracting [facility]) Hospital Liaison Note  Referral received for patient/family interest in [facility]. Chart under review by [facility] physician.   Hospice eligibility confirmed.  Plan is to [facility].   Please call with any questions or concerns. Thank you  Dionicio Stall, [facility] Liaison [phone]

## 2022-04-09 DIAGNOSIS — Z515 Encounter for palliative care: Secondary | ICD-10-CM

## 2022-04-09 DIAGNOSIS — E43 Unspecified severe protein-calorie malnutrition: Secondary | ICD-10-CM | POA: Diagnosis not present

## 2022-04-09 DIAGNOSIS — N179 Acute kidney failure, unspecified: Secondary | ICD-10-CM | POA: Diagnosis not present

## 2022-04-09 DIAGNOSIS — E875 Hyperkalemia: Secondary | ICD-10-CM | POA: Diagnosis not present

## 2022-04-09 DIAGNOSIS — T8781 Dehiscence of amputation stump: Secondary | ICD-10-CM | POA: Diagnosis not present

## 2022-04-09 DIAGNOSIS — E1165 Type 2 diabetes mellitus with hyperglycemia: Secondary | ICD-10-CM | POA: Diagnosis not present

## 2022-04-09 DIAGNOSIS — R7989 Other specified abnormal findings of blood chemistry: Secondary | ICD-10-CM | POA: Diagnosis not present

## 2022-04-09 DIAGNOSIS — N1832 Chronic kidney disease, stage 3b: Secondary | ICD-10-CM | POA: Diagnosis not present

## 2022-04-09 MED ORDER — PHENOL 1.4 % MT LIQD
1.0000 | OROMUCOSAL | 0 refills | Status: AC | PRN
Start: 2022-04-09 — End: ?

## 2022-04-09 MED ORDER — ONDANSETRON HCL 4 MG/2ML IJ SOLN
4.0000 mg | Freq: Four times a day (QID) | INTRAMUSCULAR | 0 refills | Status: AC | PRN
Start: 2022-04-09 — End: ?

## 2022-04-09 MED ORDER — PANTOPRAZOLE SODIUM 40 MG PO TBEC: 40.0000 mg | DELAYED_RELEASE_TABLET | Freq: Every morning | ORAL | Status: AC

## 2022-04-09 MED ORDER — LORAZEPAM 2 MG/ML PO CONC: 0.5000 mg | ORAL | 0 refills | Status: AC | PRN

## 2022-04-09 MED ORDER — OXYCODONE HCL 5 MG PO TABS
5.0000 mg | ORAL_TABLET | Freq: Four times a day (QID) | ORAL | 0 refills | Status: AC | PRN
Start: 2022-04-09 — End: ?

## 2022-04-09 MED ORDER — GUAIFENESIN-DM 100-10 MG/5ML PO SYRP: 15.0000 mL | ORAL_SOLUTION | ORAL | 0 refills | Status: AC | PRN

## 2022-04-09 MED ORDER — MORPHINE SULFATE (CONCENTRATE) 10 MG/0.5ML PO SOLN: 5.0000 mg | ORAL | 0 refills | Status: AC | PRN

## 2022-04-09 MED ORDER — LEVOTHYROXINE SODIUM 50 MCG PO TABS: 50.0000 ug | ORAL_TABLET | Freq: Every day | ORAL | Status: AC

## 2022-04-09 MED ORDER — ACETAMINOPHEN 325 MG PO TABS: 650.0000 mg | ORAL_TABLET | Freq: Three times a day (TID) | ORAL | Status: AC

## 2022-04-09 MED ORDER — ALUM & MAG HYDROXIDE-SIMETH 200-200-20 MG/5ML PO SUSP: 15.0000 mL | ORAL | 0 refills | Status: AC | PRN

## 2022-04-09 NOTE — TOC Transition Note (Signed)
Transition of Care [facility]) - CM/SW Discharge Note   Patient Details  Name: [name] MRN: [id number] Date of Birth: August 24, 1960  Transition of Care [facility]) CM/SW Contact:  Verna Czech [place], [place] Phone Number: 04/09/2022, 12:45 PM   Clinical Narrative:    Patient to discharge to [facility] today, patient to be transported by [facility]. [facility] contacted for transport. Discharge packet on chart.  Greg Eckrich, LCSW Transition of Care     Final next level of care: Skilled Nursing Facility Barriers to Discharge: Continued Medical Work up   Patient Goals and CMS Choice Patient states their goals for this hospitalization and ongoing recovery are:: Rehab CMS Medicare.gov Compare Post Acute Care list provided to:: Patient Choice offered to / list presented to : Sibling  Discharge Placement                       Discharge Plan and Services In-house Referral: Clinical Social Work   Post Acute Care Choice: Skilled Nursing Facility                               Social Determinants of Health (SDOH) Interventions     Readmission Risk Interventions    02/15/2022    9:01 AM 02/07/2022   12:17 PM  Readmission Risk Prevention Plan  Transportation Screening Complete Complete  PCP or Specialist Appt within 5-7 Days  Complete  PCP or Specialist Appt within 3-5 Days Complete   Home Care Screening  Complete  Medication Review (RN CM)  Complete  HRI or Home Care Consult Complete   Social Work Consult for Recovery Care Planning/Counseling Complete   Palliative Care Screening Not Applicable   Medication Review Oceanographer) Complete

## 2022-04-09 NOTE — Discharge Summary (Signed)
Name: [name] MRN: [id number] DOB: 12/20/1960 61 y.o. PCP: Loyal Jacobson, MD  Date of Admission: 03/22/2022  1:32 PM Date of Discharge: 04/09/22 Attending Physician: Dr. Cleda Daub  Discharge Diagnosis: Principal Problem:   AKI (acute kidney injury) [facility]) Active Problems:   Uncontrolled type 2 diabetes mellitus with hyperglycemia (HCC)   Dehiscence of amputation stump of right lower extremity (HCC)   Sacral wound   Severe protein-calorie malnutrition (HCC)   Pressure injury of skin   Hypotension   Hospice care patient   Discharge Medications: Allergies as of 04/09/2022       Reactions   Penicillins Hives   Has patient had a PCN reaction causing immediate rash, facial/tongue/throat swelling, SOB or lightheadedness with hypotension: No Has patient had a PCN reaction causing severe rash involving mucus membranes or skin necrosis: No Has patient had a PCN reaction that required hospitalization No Has patient had a PCN reaction occurring within the last 10 years: No If all of the above answers are "NO", then may proceed with Cephalosporin use.   Dulaglutide Diarrhea   Trulicity- "Allergic," per MAR   Tositumomab Other (See Comments)   "Allergic," per MAR   Erythromycin Diarrhea, Other (See Comments)   "Allergic," per MAR   Gadolinium Nausea And Vomiting, Other (See Comments)    Code: VOM, Desc: Pt began vomiting immed post infusion of multihance, Onset Date: [id number] "Allergic," per MAR   Iodine-131 Nausea And Vomiting, Other (See Comments)   "Allergic," per MAR   Ivp Dye [iodinated Contrast Media] Nausea And Vomiting, Other (See Comments)   "Allergic," per MAR   Metformin Diarrhea, Other (See Comments)   "Allergic," per [facility]        Medication List     STOP taking these medications    ascorbic acid 500 MG tablet Commonly known as: VITAMIN C   B-12 1000 MCG Tabs   Basaglar KwikPen 100 UNIT/ML   carvedilol 3.125 MG tablet Commonly known as: COREG    clopidogrel 75 MG tablet Commonly known as: PLAVIX   collagenase 250 UNIT/GM ointment Commonly known as: SANTYL   Easy Comfort Lancets Misc   Farxiga 10 MG Tabs tablet Generic drug: dapagliflozin propanediol   feeding supplement (PRO-STAT SUGAR FREE 64) Liqd   feeding supplement Liqd   fenofibrate 160 MG tablet   FLUoxetine 10 MG capsule Commonly known as: PROZAC   glimepiride 4 MG tablet Commonly known as: AMARYL   insulin degludec 100 UNIT/ML FlexTouch Pen Commonly known as: TRESIBA   leptospermum manuka honey Pste paste   lisinopril 2.5 MG tablet Commonly known as: ZESTRIL   metoCLOPramide 10 MG tablet Commonly known as: REGLAN   mirtazapine 7.5 MG tablet Commonly known as: REMERON   multivitamin with minerals Tabs tablet   nutrition supplement (JUVEN) Pack   oxybutynin 10 MG 24 hr tablet Commonly known as: DITROPAN-XL   rosuvastatin 40 MG tablet Commonly known as: CRESTOR   saccharomyces boulardii 250 MG capsule Commonly known as: FLORASTOR   zinc sulfate 220 (50 Zn) MG capsule       TAKE these medications    acetaminophen 325 MG tablet Commonly known as: TYLENOL Take 2 tablets (650 mg total) by mouth 3 (three) times daily. What changed:  medication strength how much to take when to take this reasons to take this   alum & mag hydroxide-simeth 200-200-20 MG/5ML suspension Commonly known as: MAALOX/MYLANTA Take 15-30 mLs by mouth every 2 (two) hours as needed for indigestion.   guaiFENesin-dextromethorphan  100-10 MG/5ML syrup Commonly known as: ROBITUSSIN DM Take 15 mLs by mouth every 4 (four) hours as needed for cough.   levothyroxine 50 MCG tablet Commonly known as: SYNTHROID Take 1 tablet (50 mcg total) by mouth daily at 6 (six) AM. Start taking on: April 10, 2022 What changed: when to take this   LORazepam 2 MG/ML concentrated solution Commonly known as: ATIVAN Take 0.3 mLs (0.6 mg total) by mouth every 4 (four) hours as  needed for anxiety.   morphine CONCENTRATE 10 MG/0.5ML Soln concentrated solution Take 0.25 mLs (5 mg total) by mouth every 2 (two) hours as needed for severe pain.   ondansetron 4 MG/2ML Soln injection Commonly known as: ZOFRAN Inject 2 mLs (4 mg total) into the vein every 6 (six) hours as needed for nausea or vomiting.   oxyCODONE 5 MG immediate release tablet Commonly known as: Oxy IR/ROXICODONE Take 1 tablet (5 mg total) by mouth every 6 (six) hours as needed for breakthrough pain or moderate pain. What changed:  how much to take when to take this reasons to take this   pantoprazole 40 MG tablet Commonly known as: PROTONIX Take 1 tablet (40 mg total) by mouth every morning. Start taking on: April 10, 2022 What changed: when to take this   phenol 1.4 % Liqd Commonly known as: CHLORASEPTIC Use as directed 1 spray in the mouth or throat as needed for throat irritation / pain.               Discharge Care Instructions  (From admission, onward)           Start     Ordered   04/09/22 0000  Leave dressing on - Keep it clean, dry, and intact until clinic visit        04/09/22 1020   03/30/22 0000  Change dressing       Comments: Remove wound VAC dressing and apply a dry dressing at time of discharge.   03/30/22 0758           Disposition and follow-up:   Ms.[name] was discharged from [facility] in [place] condition.  At the hospital follow up visit please address:  1.  Follow-up:  a. Continue comfort care measures  Hospital Course by problem list: Encephalopathy Patient for most of the course is alert and oriented to person and place. Likely multifactorial with history of poor nutrition, chronic wounds, s/p right AKA and kidney injury. She is unable to make decisions for herself due to the confusion and disorientation. This did not improve with enteric nutrition, resolution of her osteomyelitis, and correction of her electrolyte  derangements. Sister and brother present during admission and throughout hospital course. Palliative care consulted due to worsening mentation and poor prognosis. Discussed with family about patient's goals of care. After extensive treatment and care with minimal improvement that would lead to significant improvement in her quality of life, ultimately family decided on comfort measures.   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 Exam noted right LE ulceration with bony exposure. Xray showed  evidence of soft tissue ulceration unable to rule out osteomyelitis. Unable to obtain MRI with history of pacemaker. IV antibiotics started for concern of osteomyelitis. Orthopedics was consulted and performed right AKA during hospital course. Blood cultures negative. Wound vacc in place and continued wound care.  Acute on chronic kidney injury 3B Elevated  BUN Baseline creatinine of 1.5 from prior hospitalization. Presented with creatinine of 4.22. UA showed proteinuria and leukocytes and rare bacteria. Has foley catheter in place from facility. Renal US normal. Cause is likely pre-renal etiology from hypotension and poor p.o. intake. Creatinine improved over course but BUN continued to increase likely from increased protein intake via feeding tube. No evidence of bleeding, Hgb was steady.   Malnutrition Electrolyte abnormalities Patient has poor po intake throughout and on admission did not have an appetite. This leading to electrolyte depletions and poor wound healing. Cortrak was placed with continuous feeding along with nutritional supplements. Improvements with replenishing electrolytes and some improvement on renal function. Although patient was receiving nutrition via feeding tube, she continues to refuse meals. Discussed with family and palliative care about the decline and agreed upon  comfort measures. Stopped tube feedings and will provide food if she wants to eat.   Chronic sacral wound Sacral wound was debrided on prior hospitalization with antibiotic therapy. No evidence of acute infection on exam. Considering her poor nutrition status and uncontrolled diabetes, poor wound healing likely. Provided wound care along with flexi-seal to prevent stools from reaching the wound area.   Episodic Hypoglycemia  Insulin-dependent type 2 diabetes mellitus Due to the poor po intake, patient was having hypoglycemic episodes. Cortrak was placed which resolved the hypoglycemia. Adjusted insulin regimen during course to achieve better glycemic control. Other etiologies of the hypoglycemia were considered, AM cortisol was normal.   HFpEF 2017 Echo showed EF of 65-70%. She was not hypervolemic on exam.   Normocytic anemia Anemia of chronic disease in setting of multiple chronic wounds. She received blood transfusion prior to surgery for right AKA.   Thrush Patient developed oral thrush. Exam showed few white plaques along hard palate. Started on antifungal therapy.   Advance Care Planning Patient for most of her hospital stay was confused and unable to make decisions on her own. She remains alert and oriented to self and at times place. Discussed with family regarding patient's goals and wishes. After much consideration and no significant improvement over course of several weeks, patient was transitioned to full comfort measures and hospice care.    Discharge Subjective: Resting in bed comfortably. Denies being in any discomfort.   Discharge Exam:   BP 127/65 (BP Location: Right Arm)   Pulse 71   Temp 97.9 F (36.6 C) (Oral)   Resp 14   Ht 5\' 5"  (1.651 m)   Wt 101 kg   SpO2 93%   BMI 37.05 kg/m  Constitutional: ill appearing HENT: normocephalic atraumatic Neck: supple Cardiovascular: regular rate Pulmonary/Chest: normal work of breathing on room air MSK: left BKA, right  AKA Neurological: alert & oriented  Skin: warm and dry Psych: normal mood   Pertinent Labs, Studies, and Procedures:     Latest Ref Rng & Units 04/06/2022    3:55 AM 04/05/2022    4:47 AM 04/04/2022    4:44 AM  CBC  WBC 4.0 - 10.5 K/uL 8.8  7.2  7.4   Hemoglobin 12.0 - 15.0 g/dL 9.6  9.3  9.8   Hematocrit 36.0 - 46.0 % 28.9  28.8  29.2   Platelets 150 - 400 K/uL 345  313  271        Latest Ref Rng & Units 04/08/2022    2:45 AM 04/07/2022    3:51 AM 04/06/2022    3:55 AM  CMP  Glucose 70 - 99 mg/dL 172  269  225  BUN 6 - 20 mg/dL 086  578  469   Creatinine 0.44 - 1.00 mg/dL 6.29  5.28  4.13   Sodium 135 - 145 mmol/L 135  135  135   Potassium 3.5 - 5.1 mmol/L 4.9  5.1  4.6   Chloride 98 - 111 mmol/L 96  94  95   CO2 22 - 32 mmol/L 27  30  27    Calcium 8.9 - 10.3 mg/dL 9.5  9.6  9.5     DG Tibia/Fibula Right  Result Date: 03/22/2022 CLINICAL DATA:  Leg wound. EXAM: RIGHT TIBIA AND FIBULA - 2 VIEW COMPARISON:  None Available. FINDINGS: There has been prior below-the-knee amputation. There is soft tissue ulceration and air overlying the distal aspect of the fibula and lateral distal aspect of the tibia. Bony areas appear exposed in this region. There is no periosteal reaction identified. No erosive changes are seen. No acute fracture or dislocation. IMPRESSION: 1. Prior below-knee amputation. There is soft tissue ulceration in bony exposure overlying the distal tibia and fibula. Osteomyelitis not excluded. No cortical erosion or periosteal reaction. Electronically Signed   By: 05/22/2022 M.D.   On: 03/22/2022 21:00   05/22/2022 RENAL  Result Date: 03/22/2022 CLINICAL DATA:  Acute kidney injury. EXAM: RENAL / URINARY TRACT ULTRASOUND COMPLETE COMPARISON:  CT 03/22/2022 FINDINGS: Right Kidney: Renal measurements: 11.6 x 4.8 x 5.3 cm = volume: 154 mL. Echogenicity within normal limits. No mass or hydronephrosis visualized. Left Kidney: Renal measurements: 10.4 x 5.7 x 5.5 cm = volume: 170 mL.  Echogenicity within normal limits. No mass or hydronephrosis visualized. Bladder: Bladder is not visualized, likely decompressed. Other: None. IMPRESSION: Normal ultrasound appearance of the kidneys.  No hydronephrosis. Electronically Signed   By: 05/22/2022 M.D.   On: 03/22/2022 20:04   CT ABDOMEN PELVIS WO CONTRAST  Result Date: 03/22/2022 CLINICAL DATA:  Sepsis. EXAM: CT ABDOMEN AND PELVIS WITHOUT CONTRAST TECHNIQUE: Multidetector CT imaging of the abdomen and pelvis was performed following the standard protocol without IV contrast. RADIATION DOSE REDUCTION: This exam was performed according to the departmental dose-optimization program which includes automated exposure control, adjustment of the mA and/or kV according to patient size and/or use of iterative reconstruction technique. COMPARISON:  February 05, 2022.  October 16, 2018. FINDINGS: Lower chest: No acute abnormality. Hepatobiliary: No focal liver abnormality is seen. No gallstones, gallbladder wall thickening, or biliary dilatation. Pancreas: Unremarkable. No pancreatic ductal dilatation or surrounding inflammatory changes. Spleen: Normal in size without focal abnormality. Adrenals/Urinary Tract: Adrenal glands and kidneys are unremarkable. No hydronephrosis or renal obstruction is noted. Urinary bladder is decompressed secondary to Foley catheter. Stomach/Bowel: Stomach is within normal limits. Appendix appears normal. No evidence of bowel wall thickening, distention, or inflammatory changes. Vascular/Lymphatic: Aortic atherosclerosis. No enlarged abdominal or pelvic lymph nodes. Reproductive: Uterus and bilateral adnexa are unremarkable. Other: No abdominal wall hernia or abnormality. No abdominopelvic ascites. Musculoskeletal: No acute or significant osseous findings. IMPRESSION: No acute abnormality seen in the abdomen or pelvis. Aortic Atherosclerosis (ICD10-I70.0). Electronically Signed   By: October 18, 2018 M.D.   On: 03/22/2022 16:09    DG Chest Port 1 View  Result Date: 03/22/2022 CLINICAL DATA:  Sepsis. EXAM: PORTABLE CHEST 1 VIEW COMPARISON:  10/18/2015 chest x-ray and 12/09/2018 through the CT scan. FINDINGS: The cardiac silhouette, mediastinal and hilar contours are within normal limits and stable. The pacer wires are stable. The lungs are clear of an acute process. No pleural effusions or pulmonary lesions. No  pneumothorax. The bony thorax is intact. IMPRESSION: No acute cardiopulmonary findings. Electronically Signed   By: Marijo Sanes M.D.   On: 03/22/2022 14:41     Discharge Instructions: Discharge Instructions     Change dressing   Complete by: As directed    Remove wound VAC dressing and apply a dry dressing at time of discharge.   Diet - low sodium heart healthy   Complete by: As directed    Increase activity slowly   Complete by: As directed    Leave dressing on - Keep it clean, dry, and intact until clinic visit   Complete by: As directed        Signed: Riesa Pope, MD 04/09/2022, 10:20 AM   Pager: [phone]

## 2022-04-09 NOTE — Discharge Instructions (Signed)
[name],   Thank you for allowing [place] to care for you during the hospitalization. Due to your multiple chronic medical conditions and no significant improvement, the decision was made to transition to comfort care.

## 2022-04-09 NOTE — Progress Notes (Signed)
Palliative Medicine Progress Note   Patient Name: [name]       Date: 04/09/2022 DOB: 1961-06-28  Age: 61 y.o. MRN#: [id number] Attending Physician: Nichole Rung, DO Primary Care Physician: Nichole Jacobson, MD Admit Date: 03/22/2022  Reason for Consultation/Follow-up: Symptom management, end-of-life care  HPI/Patient Profile: [name] is a 61 year old female with PMH of HFpEF, COPD, GERD, HLD, HTN, hypothyroidism, OSA on CPAP, PVD, sinus tachycardia s/p pacemaker, and IDDM who has a chronic sacral wound, right BKA with exposed bone and poor wound healing who presented with hypotension and admitted for sepsis and malnutrition. [name] was placed for nutrition and right BKA revision with osteomyelitis. No improvement in neuro status one week post surgery.  Not eating food on trays, severe malnutrition.  Palliative Medicine was consulted for goals of care  Subjective: Chart reviewed.  I went to see patient at bedside.  She is resting with her eyes closed but awakens to voice.  She denies pain.  No nonverbal signs of discomfort noted.  Respirations are even and unlabored.  She does ask for something to drink, so I gave her several sips of water.  Noted coughing immediately afterward. No family at bedside currently.    Objective:  Physical Exam Vitals reviewed.  Constitutional:      General: She is not in acute distress.    Appearance: She is ill-appearing.  Pulmonary:     Effort: Pulmonary effort is normal. No respiratory distress.  Musculoskeletal:     Right Lower Extremity: Right leg is amputated above knee.     Left Lower Extremity: Left leg is amputated above knee.  Neurological:     Mental Status: She is lethargic.             Vital Signs: BP 127/65 (BP Location: Right  Arm)   Pulse 71   Temp 97.9 F (36.6 C) (Oral)   Resp 14   Ht 5\' 5"  (1.651 m)   Wt 101 kg   SpO2 93%   BMI 37.05 kg/m  SpO2: SpO2: 93 % O2 Device: O2 Device: Room Air O2 Flow Rate:    LBM: Last BM Date : 04/08/22     Palliative Assessment/Data: PPS 10-20%     Palliative Medicine Assessment & Plan   Assessment: Principal Problem:   AKI (acute kidney  injury) [facility]) Active Problems:   Uncontrolled type 2 diabetes mellitus with hyperglycemia (HCC)   Dehiscence of amputation stump of right lower extremity (HCC)   Sacral wound   Severe protein-calorie malnutrition (HCC)   Pressure injury of skin   Hypotension   Hospice care patient    Recommendations/Plan: Continue comfort care DNR/DNI as previously documented Pending transfer to [facility] today As needed medications are available for symptom management at end-of-life  Symptom Management:  Morphine prn for pain or dyspnea Lorazepam (ATIVAN) prn for anxiety Ondansetron (ZOFRAN) prn for nausea  Prognosis:  < 2 weeks  Discharge Planning: Hospice facility    Thank you for allowing the Palliative Medicine Team to assist in the care of this patient.   MDM - moderate   Nichole Proud, NP   Please contact Palliative Medicine Team phone at [phone] for questions and concerns.  For individual providers, please see AMION.

## 2022-04-09 NOTE — Progress Notes (Signed)
[facility]) Hospital Liaison Note  Plan is to transfer to [facility] today. Unit RN please call report to [phone] prior to patient leaving the unit. Please send signed DNR with patient.   Please leave all IV access and ports in place.   Please call with any questions or concerns. Thank you  Dionicio Stall, [facility] Liaison [phone]

## 2024-03-28 ENCOUNTER — Encounter: Payer: Self-pay | Admitting: Emergency Medicine
